# Patient Record
Sex: Male | Born: 1940 | Race: White | Hispanic: No | Marital: Married | Smoking: Former smoker
Health system: Southern US, Community
[De-identification: ages and names within clinical notes are randomized; demographics above are authoritative.]

## PROBLEM LIST (undated history)

## (undated) DIAGNOSIS — F419 Anxiety disorder, unspecified: Secondary | ICD-10-CM

## (undated) DIAGNOSIS — M545 Low back pain, unspecified: Secondary | ICD-10-CM

## (undated) DIAGNOSIS — G834 Cauda equina syndrome: Secondary | ICD-10-CM

## (undated) DIAGNOSIS — M879 Osteonecrosis, unspecified: Secondary | ICD-10-CM

## (undated) DIAGNOSIS — I5032 Chronic diastolic (congestive) heart failure: Secondary | ICD-10-CM

## (undated) DIAGNOSIS — R339 Retention of urine, unspecified: Secondary | ICD-10-CM

## (undated) DIAGNOSIS — D759 Disease of blood and blood-forming organs, unspecified: Secondary | ICD-10-CM

## (undated) DIAGNOSIS — E785 Hyperlipidemia, unspecified: Secondary | ICD-10-CM

## (undated) DIAGNOSIS — K219 Gastro-esophageal reflux disease without esophagitis: Secondary | ICD-10-CM

## (undated) DIAGNOSIS — R197 Diarrhea, unspecified: Secondary | ICD-10-CM

## (undated) DIAGNOSIS — I4891 Unspecified atrial fibrillation: Secondary | ICD-10-CM

## (undated) DIAGNOSIS — M5030 Other cervical disc degeneration, unspecified cervical region: Secondary | ICD-10-CM

## (undated) DIAGNOSIS — Z8614 Personal history of Methicillin resistant Staphylococcus aureus infection: Secondary | ICD-10-CM

## (undated) DIAGNOSIS — I2510 Atherosclerotic heart disease of native coronary artery without angina pectoris: Secondary | ICD-10-CM

## (undated) DIAGNOSIS — J189 Pneumonia, unspecified organism: Secondary | ICD-10-CM

## (undated) DIAGNOSIS — N419 Inflammatory disease of prostate, unspecified: Secondary | ICD-10-CM

## (undated) DIAGNOSIS — M1990 Unspecified osteoarthritis, unspecified site: Secondary | ICD-10-CM

## (undated) DIAGNOSIS — M8700 Idiopathic aseptic necrosis of unspecified bone: Secondary | ICD-10-CM

## (undated) DIAGNOSIS — R600 Localized edema: Secondary | ICD-10-CM

## (undated) DIAGNOSIS — D509 Iron deficiency anemia, unspecified: Secondary | ICD-10-CM

## (undated) DIAGNOSIS — R739 Hyperglycemia, unspecified: Secondary | ICD-10-CM

## (undated) DIAGNOSIS — K439 Ventral hernia without obstruction or gangrene: Secondary | ICD-10-CM

## (undated) DIAGNOSIS — I10 Essential (primary) hypertension: Secondary | ICD-10-CM

## (undated) DIAGNOSIS — K56609 Unspecified intestinal obstruction, unspecified as to partial versus complete obstruction: Secondary | ICD-10-CM

## (undated) DIAGNOSIS — R269 Unspecified abnormalities of gait and mobility: Secondary | ICD-10-CM

## (undated) DIAGNOSIS — N181 Chronic kidney disease, stage 1: Secondary | ICD-10-CM

## (undated) DIAGNOSIS — M1993 Secondary osteoarthritis, unspecified site: Secondary | ICD-10-CM

## (undated) DIAGNOSIS — M109 Gout, unspecified: Secondary | ICD-10-CM

## (undated) DIAGNOSIS — N179 Acute kidney failure, unspecified: Secondary | ICD-10-CM

## (undated) DIAGNOSIS — R040 Epistaxis: Secondary | ICD-10-CM

## (undated) HISTORY — DX: Ventral hernia without obstruction or gangrene: K43.9

## (undated) HISTORY — DX: Diarrhea, unspecified: R19.7

## (undated) HISTORY — DX: Osteonecrosis, unspecified: M87.9

## (undated) HISTORY — DX: Gout, unspecified: M10.9

## (undated) HISTORY — DX: Atherosclerotic heart disease of native coronary artery without angina pectoris: I25.10

## (undated) HISTORY — DX: Hyperlipidemia, unspecified: E78.5

## (undated) HISTORY — DX: Localized edema: R60.0

## (undated) HISTORY — PX: CATARACT EXTRACTION W/ INTRAOCULAR LENS  IMPLANT, BILATERAL: SHX1307

## (undated) HISTORY — DX: Unspecified atrial fibrillation: I48.91

## (undated) HISTORY — PX: BACK SURGERY: SHX140

## (undated) HISTORY — DX: Anxiety disorder, unspecified: F41.9

## (undated) HISTORY — DX: Inflammatory disease of prostate, unspecified: N41.9

## (undated) HISTORY — DX: Unspecified abnormalities of gait and mobility: R26.9

## (undated) HISTORY — DX: Essential (primary) hypertension: I10

## (undated) HISTORY — PX: SHOULDER OPEN ROTATOR CUFF REPAIR: SHX2407

## (undated) HISTORY — DX: Chronic diastolic (congestive) heart failure: I50.32

## (undated) HISTORY — DX: Secondary osteoarthritis, unspecified site: M19.93

## (undated) HISTORY — DX: Hyperglycemia, unspecified: R73.9

## (undated) HISTORY — DX: Retention of urine, unspecified: R33.9

## (undated) HISTORY — DX: Cauda equina syndrome: G83.4

## (undated) HISTORY — PX: INCISION AND DRAINAGE OF WOUND: SHX1803

## (undated) HISTORY — DX: Epistaxis: R04.0

## (undated) HISTORY — DX: Low back pain, unspecified: M54.50

## (undated) HISTORY — DX: Personal history of Methicillin resistant Staphylococcus aureus infection: Z86.14

## (undated) HISTORY — DX: Chronic kidney disease, stage 1: N18.1

## (undated) HISTORY — DX: Acute kidney failure, unspecified: N17.9

## (undated) HISTORY — PX: CHOLECYSTECTOMY: SHX55

## (undated) HISTORY — DX: Gastro-esophageal reflux disease without esophagitis: K21.9

## (undated) HISTORY — DX: Pneumonia, unspecified organism: J18.9

## (undated) HISTORY — PX: EYE SURGERY: SHX253

## (undated) HISTORY — DX: Other cervical disc degeneration, unspecified cervical region: M50.30

## (undated) HISTORY — PX: PERIPHERALLY INSERTED CENTRAL CATHETER INSERTION: SHX2221

## (undated) HISTORY — DX: Iron deficiency anemia, unspecified: D50.9

---
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
## 1998-07-28 HISTORY — PX: CORONARY ARTERY BYPASS GRAFT: SHX141

## 1998-07-28 HISTORY — PX: OTHER SURGICAL HISTORY: SHX169

## 1998-10-02 ENCOUNTER — Inpatient Hospital Stay (HOSPITAL_COMMUNITY): Admission: AD | Admit: 1998-10-02 | Discharge: 1998-10-19 | Payer: Self-pay | Admitting: Cardiology

## 1998-10-07 ENCOUNTER — Encounter: Payer: Self-pay | Admitting: Thoracic Surgery (Cardiothoracic Vascular Surgery)

## 1998-10-08 ENCOUNTER — Encounter: Payer: Self-pay | Admitting: Thoracic Surgery (Cardiothoracic Vascular Surgery)

## 1998-10-09 ENCOUNTER — Encounter: Payer: Self-pay | Admitting: Thoracic Surgery (Cardiothoracic Vascular Surgery)

## 1998-10-10 ENCOUNTER — Encounter: Payer: Self-pay | Admitting: Thoracic Surgery (Cardiothoracic Vascular Surgery)

## 1998-10-11 ENCOUNTER — Encounter: Payer: Self-pay | Admitting: Thoracic Surgery (Cardiothoracic Vascular Surgery)

## 1998-10-12 ENCOUNTER — Encounter: Payer: Self-pay | Admitting: Thoracic Surgery (Cardiothoracic Vascular Surgery)

## 1998-10-13 ENCOUNTER — Encounter: Payer: Self-pay | Admitting: Thoracic Surgery (Cardiothoracic Vascular Surgery)

## 1998-10-14 ENCOUNTER — Encounter: Payer: Self-pay | Admitting: Thoracic Surgery (Cardiothoracic Vascular Surgery)

## 1998-10-16 ENCOUNTER — Encounter: Payer: Self-pay | Admitting: Thoracic Surgery (Cardiothoracic Vascular Surgery)

## 1998-11-03 ENCOUNTER — Encounter: Payer: Self-pay | Admitting: Cardiology

## 1998-11-03 ENCOUNTER — Inpatient Hospital Stay (HOSPITAL_COMMUNITY): Admission: AD | Admit: 1998-11-03 | Discharge: 1998-11-15 | Payer: Self-pay | Admitting: Cardiology

## 1998-11-05 ENCOUNTER — Encounter: Payer: Self-pay | Admitting: Cardiology

## 1998-11-06 ENCOUNTER — Encounter: Payer: Self-pay | Admitting: Thoracic Surgery (Cardiothoracic Vascular Surgery)

## 1998-11-07 ENCOUNTER — Encounter: Payer: Self-pay | Admitting: Thoracic Surgery (Cardiothoracic Vascular Surgery)

## 1998-11-08 ENCOUNTER — Encounter: Payer: Self-pay | Admitting: Cardiology

## 1998-11-09 ENCOUNTER — Encounter: Payer: Self-pay | Admitting: Thoracic Surgery (Cardiothoracic Vascular Surgery)

## 1998-11-11 ENCOUNTER — Encounter: Payer: Self-pay | Admitting: Cardiology

## 1998-11-12 ENCOUNTER — Encounter: Payer: Self-pay | Admitting: Thoracic Surgery (Cardiothoracic Vascular Surgery)

## 1998-11-23 ENCOUNTER — Emergency Department (HOSPITAL_COMMUNITY): Admission: EM | Admit: 1998-11-23 | Discharge: 1998-11-23 | Payer: Self-pay | Admitting: Emergency Medicine

## 2001-02-09 ENCOUNTER — Ambulatory Visit (HOSPITAL_COMMUNITY): Admission: RE | Admit: 2001-02-09 | Discharge: 2001-02-09 | Payer: Self-pay | Admitting: *Deleted

## 2001-08-26 ENCOUNTER — Emergency Department (HOSPITAL_COMMUNITY): Admission: EM | Admit: 2001-08-26 | Discharge: 2001-08-27 | Payer: Self-pay | Admitting: Emergency Medicine

## 2001-08-27 ENCOUNTER — Encounter: Payer: Self-pay | Admitting: Emergency Medicine

## 2002-07-18 ENCOUNTER — Emergency Department (HOSPITAL_COMMUNITY): Admission: EM | Admit: 2002-07-18 | Discharge: 2002-07-18 | Payer: Self-pay | Admitting: *Deleted

## 2002-07-19 ENCOUNTER — Inpatient Hospital Stay (HOSPITAL_COMMUNITY): Admission: EM | Admit: 2002-07-19 | Discharge: 2002-07-25 | Payer: Self-pay | Admitting: Emergency Medicine

## 2002-07-19 ENCOUNTER — Encounter: Payer: Self-pay | Admitting: Emergency Medicine

## 2003-03-30 ENCOUNTER — Encounter: Payer: Self-pay | Admitting: Emergency Medicine

## 2003-03-30 ENCOUNTER — Emergency Department (HOSPITAL_COMMUNITY): Admission: EM | Admit: 2003-03-30 | Discharge: 2003-03-30 | Payer: Self-pay | Admitting: Emergency Medicine

## 2003-07-17 ENCOUNTER — Inpatient Hospital Stay (HOSPITAL_COMMUNITY): Admission: AD | Admit: 2003-07-17 | Discharge: 2003-07-21 | Payer: Self-pay | Admitting: General Surgery

## 2003-07-30 ENCOUNTER — Emergency Department (HOSPITAL_COMMUNITY): Admission: EM | Admit: 2003-07-30 | Discharge: 2003-07-30 | Payer: Self-pay | Admitting: Emergency Medicine

## 2003-08-13 ENCOUNTER — Emergency Department (HOSPITAL_COMMUNITY): Admission: EM | Admit: 2003-08-13 | Discharge: 2003-08-13 | Payer: Self-pay | Admitting: Emergency Medicine

## 2003-09-27 ENCOUNTER — Ambulatory Visit (HOSPITAL_COMMUNITY): Admission: RE | Admit: 2003-09-27 | Discharge: 2003-09-27 | Payer: Self-pay | Admitting: Preventative Medicine

## 2004-08-26 ENCOUNTER — Inpatient Hospital Stay (HOSPITAL_COMMUNITY): Admission: EM | Admit: 2004-08-26 | Discharge: 2004-08-28 | Payer: Self-pay | Admitting: Cardiology

## 2004-08-26 ENCOUNTER — Encounter: Payer: Self-pay | Admitting: Emergency Medicine

## 2004-08-26 ENCOUNTER — Ambulatory Visit: Payer: Self-pay | Admitting: Cardiovascular Disease

## 2004-09-18 ENCOUNTER — Ambulatory Visit: Payer: Self-pay | Admitting: *Deleted

## 2004-10-30 ENCOUNTER — Ambulatory Visit: Payer: Self-pay | Admitting: Infectious Diseases

## 2004-12-11 ENCOUNTER — Ambulatory Visit: Payer: Self-pay | Admitting: Infectious Diseases

## 2005-02-17 ENCOUNTER — Ambulatory Visit: Payer: Self-pay | Admitting: Infectious Diseases

## 2005-07-17 ENCOUNTER — Ambulatory Visit (HOSPITAL_COMMUNITY): Admission: RE | Admit: 2005-07-17 | Discharge: 2005-07-17 | Payer: Self-pay | Admitting: Pulmonary Disease

## 2005-09-30 ENCOUNTER — Inpatient Hospital Stay (HOSPITAL_COMMUNITY): Admission: RE | Admit: 2005-09-30 | Discharge: 2005-10-07 | Payer: Self-pay | Admitting: General Surgery

## 2006-07-03 ENCOUNTER — Emergency Department (HOSPITAL_COMMUNITY): Admission: EM | Admit: 2006-07-03 | Discharge: 2006-07-03 | Payer: Self-pay | Admitting: Emergency Medicine

## 2008-02-11 ENCOUNTER — Inpatient Hospital Stay (HOSPITAL_COMMUNITY): Admission: AD | Admit: 2008-02-11 | Discharge: 2008-02-13 | Payer: Self-pay | Admitting: Cardiology

## 2008-02-11 ENCOUNTER — Ambulatory Visit: Payer: Self-pay | Admitting: *Deleted

## 2008-02-18 ENCOUNTER — Ambulatory Visit: Payer: Self-pay | Admitting: Cardiology

## 2008-02-24 ENCOUNTER — Ambulatory Visit: Payer: Self-pay | Admitting: Cardiovascular Disease

## 2008-02-24 ENCOUNTER — Ambulatory Visit: Payer: Self-pay | Admitting: Cardiology

## 2008-02-24 ENCOUNTER — Encounter (HOSPITAL_COMMUNITY): Admission: RE | Admit: 2008-02-24 | Discharge: 2008-03-25 | Payer: Self-pay | Admitting: Cardiology

## 2008-03-19 ENCOUNTER — Encounter: Payer: Self-pay | Admitting: Emergency Medicine

## 2008-03-19 ENCOUNTER — Inpatient Hospital Stay (HOSPITAL_COMMUNITY): Admission: EM | Admit: 2008-03-19 | Discharge: 2008-03-21 | Payer: Self-pay | Admitting: Emergency Medicine

## 2008-04-20 ENCOUNTER — Inpatient Hospital Stay (HOSPITAL_COMMUNITY): Admission: RE | Admit: 2008-04-20 | Discharge: 2008-04-26 | Payer: Self-pay | Admitting: General Surgery

## 2008-07-09 ENCOUNTER — Emergency Department (HOSPITAL_COMMUNITY): Admission: EM | Admit: 2008-07-09 | Discharge: 2008-07-09 | Payer: Self-pay | Admitting: Emergency Medicine

## 2008-07-11 ENCOUNTER — Ambulatory Visit (HOSPITAL_COMMUNITY): Admission: RE | Admit: 2008-07-11 | Discharge: 2008-07-11 | Payer: Self-pay | Admitting: Pulmonary Disease

## 2008-10-28 ENCOUNTER — Observation Stay (HOSPITAL_COMMUNITY): Admission: EM | Admit: 2008-10-28 | Discharge: 2008-10-29 | Payer: Self-pay | Admitting: Emergency Medicine

## 2009-01-17 ENCOUNTER — Ambulatory Visit: Payer: Self-pay | Admitting: Orthopedic Surgery

## 2009-01-19 ENCOUNTER — Encounter: Payer: Self-pay | Admitting: Orthopedic Surgery

## 2009-02-06 ENCOUNTER — Ambulatory Visit: Payer: Self-pay | Admitting: Orthopedic Surgery

## 2009-02-07 ENCOUNTER — Encounter (INDEPENDENT_AMBULATORY_CARE_PROVIDER_SITE_OTHER): Payer: Self-pay | Admitting: *Deleted

## 2009-02-07 ENCOUNTER — Encounter: Payer: Self-pay | Admitting: Orthopedic Surgery

## 2009-02-12 ENCOUNTER — Ambulatory Visit (HOSPITAL_COMMUNITY): Admission: RE | Admit: 2009-02-12 | Discharge: 2009-02-12 | Payer: Self-pay | Admitting: Orthopedic Surgery

## 2009-02-19 ENCOUNTER — Ambulatory Visit: Payer: Self-pay | Admitting: Orthopedic Surgery

## 2009-02-19 DIAGNOSIS — M75120 Complete rotator cuff tear or rupture of unspecified shoulder, not specified as traumatic: Secondary | ICD-10-CM | POA: Insufficient documentation

## 2009-02-26 ENCOUNTER — Telehealth: Payer: Self-pay | Admitting: Orthopedic Surgery

## 2009-03-02 ENCOUNTER — Telehealth: Payer: Self-pay | Admitting: Orthopedic Surgery

## 2009-03-20 ENCOUNTER — Ambulatory Visit: Payer: Self-pay | Admitting: Orthopedic Surgery

## 2009-03-20 ENCOUNTER — Inpatient Hospital Stay (HOSPITAL_COMMUNITY): Admission: RE | Admit: 2009-03-20 | Discharge: 2009-03-21 | Payer: Self-pay | Admitting: Orthopedic Surgery

## 2009-03-22 ENCOUNTER — Ambulatory Visit: Payer: Self-pay | Admitting: Orthopedic Surgery

## 2009-03-28 ENCOUNTER — Encounter (HOSPITAL_COMMUNITY): Admission: RE | Admit: 2009-03-28 | Discharge: 2009-04-26 | Payer: Self-pay | Admitting: Orthopedic Surgery

## 2009-03-28 ENCOUNTER — Encounter: Payer: Self-pay | Admitting: Orthopedic Surgery

## 2009-03-29 ENCOUNTER — Ambulatory Visit: Payer: Self-pay | Admitting: Orthopedic Surgery

## 2009-04-03 ENCOUNTER — Ambulatory Visit: Payer: Self-pay | Admitting: Orthopedic Surgery

## 2009-04-27 ENCOUNTER — Encounter (HOSPITAL_COMMUNITY): Admission: RE | Admit: 2009-04-27 | Discharge: 2009-05-27 | Payer: Self-pay | Admitting: Orthopedic Surgery

## 2009-05-02 ENCOUNTER — Ambulatory Visit: Payer: Self-pay | Admitting: Orthopedic Surgery

## 2009-05-04 ENCOUNTER — Encounter: Payer: Self-pay | Admitting: Orthopedic Surgery

## 2009-05-04 ENCOUNTER — Inpatient Hospital Stay (HOSPITAL_COMMUNITY): Admission: EM | Admit: 2009-05-04 | Discharge: 2009-05-05 | Payer: Self-pay | Admitting: Emergency Medicine

## 2009-05-07 ENCOUNTER — Ambulatory Visit: Payer: Self-pay | Admitting: Orthopedic Surgery

## 2009-05-11 ENCOUNTER — Ambulatory Visit (HOSPITAL_COMMUNITY): Admission: RE | Admit: 2009-05-11 | Discharge: 2009-05-11 | Payer: Self-pay | Admitting: Orthopedic Surgery

## 2009-05-15 ENCOUNTER — Ambulatory Visit: Payer: Self-pay | Admitting: Orthopedic Surgery

## 2009-05-24 ENCOUNTER — Ambulatory Visit: Payer: Self-pay | Admitting: Orthopedic Surgery

## 2009-06-12 ENCOUNTER — Encounter: Payer: Self-pay | Admitting: Orthopedic Surgery

## 2009-07-18 ENCOUNTER — Telehealth: Payer: Self-pay | Admitting: Orthopedic Surgery

## 2009-07-30 ENCOUNTER — Encounter: Payer: Self-pay | Admitting: Orthopedic Surgery

## 2009-08-02 ENCOUNTER — Ambulatory Visit: Payer: Self-pay | Admitting: Orthopedic Surgery

## 2009-08-22 ENCOUNTER — Ambulatory Visit: Payer: Self-pay | Admitting: Orthopedic Surgery

## 2009-09-12 ENCOUNTER — Ambulatory Visit: Payer: Self-pay | Admitting: Orthopedic Surgery

## 2009-10-01 ENCOUNTER — Ambulatory Visit (HOSPITAL_COMMUNITY): Admission: RE | Admit: 2009-10-01 | Discharge: 2009-10-01 | Payer: Self-pay | Admitting: Pulmonary Disease

## 2009-12-12 ENCOUNTER — Encounter (INDEPENDENT_AMBULATORY_CARE_PROVIDER_SITE_OTHER): Payer: Self-pay | Admitting: *Deleted

## 2010-01-01 ENCOUNTER — Encounter: Payer: Self-pay | Admitting: Adult Health

## 2010-01-01 ENCOUNTER — Ambulatory Visit: Payer: Self-pay | Admitting: Cardiology

## 2010-01-01 DIAGNOSIS — I10 Essential (primary) hypertension: Secondary | ICD-10-CM

## 2010-01-01 DIAGNOSIS — R079 Chest pain, unspecified: Secondary | ICD-10-CM

## 2010-01-03 ENCOUNTER — Encounter: Payer: Self-pay | Admitting: Adult Health

## 2010-01-09 ENCOUNTER — Encounter (INDEPENDENT_AMBULATORY_CARE_PROVIDER_SITE_OTHER): Payer: Self-pay | Admitting: *Deleted

## 2010-01-09 ENCOUNTER — Encounter: Payer: Self-pay | Admitting: Infectious Diseases

## 2010-01-09 LAB — CONVERTED CEMR LAB
ALT: 12 units/L
ALT: 12 units/L (ref 0–53)
AST: 17 units/L
AST: 17 units/L (ref 0–37)
Albumin: 4 g/dL
Albumin: 4 g/dL (ref 3.5–5.2)
Alkaline Phosphatase: 70 units/L
Alkaline Phosphatase: 70 units/L (ref 39–117)
Cholesterol: 131 mg/dL (ref 0–200)
HDL: 42 mg/dL
HDL: 42 mg/dL (ref >39)
Indirect Bilirubin: 0.3 mg/dL (ref 0.0–0.9)
LDL Cholesterol: 73 mg/dL (ref 0–99)
Total Protein: 6.4 g/dL
Total Protein: 6.4 g/dL (ref 6.0–8.3)
Triglycerides: 80 mg/dL (ref <150)

## 2010-01-14 ENCOUNTER — Encounter (INDEPENDENT_AMBULATORY_CARE_PROVIDER_SITE_OTHER): Payer: Self-pay | Admitting: *Deleted

## 2010-01-16 ENCOUNTER — Encounter: Payer: Self-pay | Admitting: Cardiology

## 2010-01-16 ENCOUNTER — Ambulatory Visit: Payer: Self-pay | Admitting: Cardiology

## 2010-01-16 ENCOUNTER — Encounter (HOSPITAL_COMMUNITY): Admission: RE | Admit: 2010-01-16 | Discharge: 2010-01-16 | Payer: Self-pay | Admitting: Cardiology

## 2010-01-21 ENCOUNTER — Ambulatory Visit: Payer: Self-pay | Admitting: Cardiology

## 2010-01-29 ENCOUNTER — Encounter (INDEPENDENT_AMBULATORY_CARE_PROVIDER_SITE_OTHER): Payer: Self-pay | Admitting: *Deleted

## 2010-01-31 ENCOUNTER — Encounter (HOSPITAL_COMMUNITY): Admission: RE | Admit: 2010-01-31 | Discharge: 2010-03-02 | Payer: Self-pay | Admitting: Orthopedic Surgery

## 2010-01-31 ENCOUNTER — Ambulatory Visit (HOSPITAL_COMMUNITY): Payer: Self-pay | Admitting: Orthopedic Surgery

## 2010-01-31 ENCOUNTER — Ambulatory Visit: Payer: Self-pay | Admitting: Orthopedic Surgery

## 2010-01-31 LAB — CONVERTED CEMR LAB
Neutrophil, Synovial: 99 % — ABNORMAL HIGH (ref 0–25)
WBC, Synovial: 52370 — ABNORMAL HIGH (ref 0–200)

## 2010-02-01 ENCOUNTER — Ambulatory Visit (HOSPITAL_COMMUNITY): Payer: Self-pay | Admitting: Orthopedic Surgery

## 2010-02-04 ENCOUNTER — Telehealth: Payer: Self-pay | Admitting: Orthopedic Surgery

## 2010-02-04 ENCOUNTER — Inpatient Hospital Stay (HOSPITAL_COMMUNITY): Admission: AD | Admit: 2010-02-04 | Discharge: 2010-02-11 | Payer: Self-pay | Admitting: Orthopedic Surgery

## 2010-02-04 ENCOUNTER — Ambulatory Visit: Payer: Self-pay | Admitting: Orthopedic Surgery

## 2010-02-04 DIAGNOSIS — M009 Pyogenic arthritis, unspecified: Secondary | ICD-10-CM | POA: Insufficient documentation

## 2010-02-05 ENCOUNTER — Ambulatory Visit: Payer: Self-pay | Admitting: Orthopedic Surgery

## 2010-02-07 ENCOUNTER — Encounter: Payer: Self-pay | Admitting: Orthopedic Surgery

## 2010-02-11 ENCOUNTER — Encounter: Payer: Self-pay | Admitting: Orthopedic Surgery

## 2010-02-12 ENCOUNTER — Ambulatory Visit: Payer: Self-pay | Admitting: Orthopedic Surgery

## 2010-02-14 ENCOUNTER — Ambulatory Visit: Payer: Self-pay | Admitting: Orthopedic Surgery

## 2010-02-19 ENCOUNTER — Encounter: Payer: Self-pay | Admitting: Orthopedic Surgery

## 2010-02-20 ENCOUNTER — Ambulatory Visit: Payer: Self-pay | Admitting: Orthopedic Surgery

## 2010-02-21 ENCOUNTER — Telehealth: Payer: Self-pay | Admitting: Orthopedic Surgery

## 2010-02-25 ENCOUNTER — Encounter (INDEPENDENT_AMBULATORY_CARE_PROVIDER_SITE_OTHER): Payer: Self-pay | Admitting: *Deleted

## 2010-02-25 LAB — CONVERTED CEMR LAB
BUN: 20 mg/dL
Basophils Absolute: 0 10*3/uL
Basophils Relative: 1 %
Eosinophils Relative: 9 %
HCT: 33.5 %
Lymphocytes Relative: 19 %
MCHC: 33.4 g/dL
MCV: 88.3 fL
Monocytes Absolute: 0.8 10*3/uL
Platelets: 262 10*3/uL
WBC: 6.7 10*3/uL

## 2010-02-27 ENCOUNTER — Encounter: Payer: Self-pay | Admitting: Orthopedic Surgery

## 2010-03-05 ENCOUNTER — Encounter: Payer: Self-pay | Admitting: Orthopedic Surgery

## 2010-03-13 ENCOUNTER — Ambulatory Visit: Payer: Self-pay | Admitting: Orthopedic Surgery

## 2010-03-18 ENCOUNTER — Telehealth: Payer: Self-pay | Admitting: Orthopedic Surgery

## 2010-03-19 ENCOUNTER — Telehealth: Payer: Self-pay | Admitting: Orthopedic Surgery

## 2010-04-03 ENCOUNTER — Ambulatory Visit: Payer: Self-pay | Admitting: Orthopedic Surgery

## 2010-04-18 ENCOUNTER — Encounter (INDEPENDENT_AMBULATORY_CARE_PROVIDER_SITE_OTHER): Payer: Self-pay | Admitting: *Deleted

## 2010-04-24 ENCOUNTER — Ambulatory Visit: Payer: Self-pay | Admitting: Cardiology

## 2010-04-24 DIAGNOSIS — K432 Incisional hernia without obstruction or gangrene: Secondary | ICD-10-CM | POA: Insufficient documentation

## 2010-04-24 DIAGNOSIS — E785 Hyperlipidemia, unspecified: Secondary | ICD-10-CM

## 2010-04-24 DIAGNOSIS — K219 Gastro-esophageal reflux disease without esophagitis: Secondary | ICD-10-CM

## 2010-04-24 DIAGNOSIS — M1990 Unspecified osteoarthritis, unspecified site: Secondary | ICD-10-CM | POA: Insufficient documentation

## 2010-05-13 ENCOUNTER — Ambulatory Visit: Payer: Self-pay | Admitting: Infectious Disease

## 2010-05-13 DIAGNOSIS — L299 Pruritus, unspecified: Secondary | ICD-10-CM | POA: Insufficient documentation

## 2010-05-13 DIAGNOSIS — A4902 Methicillin resistant Staphylococcus aureus infection, unspecified site: Secondary | ICD-10-CM | POA: Insufficient documentation

## 2010-05-13 DIAGNOSIS — M48061 Spinal stenosis, lumbar region without neurogenic claudication: Secondary | ICD-10-CM | POA: Insufficient documentation

## 2010-05-14 ENCOUNTER — Encounter: Payer: Self-pay | Admitting: Infectious Diseases

## 2010-06-11 ENCOUNTER — Encounter (INDEPENDENT_AMBULATORY_CARE_PROVIDER_SITE_OTHER): Payer: Self-pay | Admitting: *Deleted

## 2010-06-11 ENCOUNTER — Ambulatory Visit: Payer: Self-pay | Admitting: Orthopedic Surgery

## 2010-06-14 ENCOUNTER — Telehealth: Payer: Self-pay | Admitting: Orthopedic Surgery

## 2010-07-02 ENCOUNTER — Ambulatory Visit: Payer: Self-pay | Admitting: Infectious Disease

## 2010-07-02 ENCOUNTER — Encounter: Payer: Self-pay | Admitting: Infectious Diseases

## 2010-07-02 DIAGNOSIS — L109 Pemphigus, unspecified: Secondary | ICD-10-CM

## 2010-07-02 LAB — CONVERTED CEMR LAB
AST: 16 units/L (ref 0–37)
Alkaline Phosphatase: 84 units/L (ref 39–117)
BUN: 20 mg/dL (ref 6–23)
Basophils Relative: 0 % (ref 0–1)
Eosinophils Absolute: 0.1 10*3/uL (ref 0.0–0.7)
Eosinophils Relative: 1 % (ref 0–5)
Glucose, Bld: 96 mg/dL (ref 70–99)
HCT: 41.4 % (ref 39.0–52.0)
Hemoglobin: 13 g/dL (ref 13.0–17.0)
MCHC: 31.4 g/dL (ref 30.0–36.0)
MCV: 92.8 fL (ref 78.0–100.0)
Monocytes Absolute: 0.6 10*3/uL (ref 0.1–1.0)
Monocytes Relative: 5 % (ref 3–12)
RBC: 4.46 M/uL (ref 4.22–5.81)
RDW: 16.1 % — ABNORMAL HIGH (ref 11.5–15.5)
Total Bilirubin: 0.9 mg/dL (ref 0.3–1.2)

## 2010-07-08 ENCOUNTER — Encounter: Payer: Self-pay | Admitting: Infectious Disease

## 2010-07-28 HISTORY — PX: CORONARY ANGIOPLASTY WITH STENT PLACEMENT: SHX49

## 2010-08-17 ENCOUNTER — Encounter: Payer: Self-pay | Admitting: *Deleted

## 2010-08-29 NOTE — Letter (Signed)
Summary: Office note from Dr. Dion Saucier  Office note from Dr. Dion Saucier   Imported By: Jacklynn Ganong 07/31/2009 11:16:03  _____________________________________________________________________  External Attachment:    Type:   Image     Comment:   External Document

## 2010-08-29 NOTE — Assessment & Plan Note (Signed)
Summary: RE-CK/XRAY SHOULDER,POST OP 05/11/09/[facility]   Visit Type:  Follow-up Referring Provider:  self  CC:  right and left shoulder pain .  History of Present Illness: I saw [name] in the office today for a followup visit.  He is a 70 years old man with the complaint of:  RIGHT and LEFT shoulder pain now, LEFT greater than RIGHT.  Rotator cuff repair on 03-20-2009. He was doing well until he went to therapy and he started having increased pain, swelling, and eventually had to have an irrigation, debridement of the shoulder on May 11, 2009. Cultures were negative, but he had a large amount of fluid in the shoulder. His repair had completely come apart.  He is currently on Lorcet 10, half tablet q.4 p.r.n. for pain, and prednisone 5 mg every other day for other reasons.  The patient had a 2nd opinion regarding his RIGHT shoulder with the  [facility] in [place]. I reviewed his notes. In his notes. He notes that the patient had a LEFT rotator cuff tear diagnosed with an MRI and there was extensive tear as well.  He also brought up the possibility of an occult infection with proprionobacter acne. In addition, be explored if any other surgeries done on the RIGHT side tear. He did think that a reverse procedure might be the best option. There is concern for MRSA as well.  He is scheduled for x-rays today in the office on the RIGHT shoulder. We've also discussed treatment for the LEFT shoulder      Allergies: 1)  ! Morphine  Physical Exam  Msk:  [name] comes in todayfor reevaluation of her RIGHT shoulder and evaluation of a new LEFT shoulder rotator cuff tear diagnosed ingrained per Dr. Dion Saucier.  He has good passive range of motion of his RIGHT shoulder with no pain. There is a slight joint effusion.  The LEFT shoulder is weak. His pain forward elevation. The shoulder remained stable. Neurovascular exam is normal. He is awake and alert. He is oriented x3.  His mood and affect is normal.     Impression & Recommendations:  Problem # 1:  OTHER POSTOPERATIVE INFECTION NEC (ICD-998.59)  Orders: Est. Patient Level III (29528)  Problem # 2:  AFTERCARE FOLLOW SURGERY MUSCULOSKEL SYSTEM NEC (ICD-V58.78)  Orders: Est. Patient Level III (41324)  Problem # 3:  RUPTURE ROTATOR CUFF (ICD-727.61)  AP and lateral of the RIGHT shoulder.  There are 3 suture anchors in the humeral head. There is proximal migration of the humerus were about 1 cm evidence of IV break in the Roman arch.  Impression chronic rotator cuff tear.  Patient is rotator cuff tears, one on the LEFT, and a failed rotator cuff repair on the RIGHT.  He will continue to take his pain medication at this time until reevaluation in March at that time. We will rediscuss both shoulders, and possible treatment options. An MRI was obtained in [place] at the office of Dr. Dion Saucier on July 30, 2009. There was a large rotator cuff tear. There as well.  Orders: Est. Patient Level III (40102) Shoulder x-ray,  minimum 2 views (72536)  Patient Instructions: 1)  Come back in March 2011 for recheck on the shoulders

## 2010-08-29 NOTE — Assessment & Plan Note (Signed)
Summary: [facility]   Visit Type:  Follow-up Referring Provider:  Dr. Jeral Fruit Primary Provider:  Dr.Edward Juanetta Gosling  CC:  f/u itching and open sores.  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 No organsims were isolated and he was given a course of parenteral therapy. 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 He had been referred to [place] from Dr. 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  Per Dr. Cassandria Santee notes consideration is being given to posterolateral arthrodesis with autograft. The patient had developed severe pruritis since coming off of the steroids. Dr. Romeo Apple put pt back on prednisone and pruritis has resolved. He seems to have considerable confusion about the difference between his pemphigus and his pyogenic infections. He is without fevers, chills or increase in shoulder pain.    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 Take 1 Tablet By Mouth Once Daily 2)  Celexa 20 Mg Tabs  (Citalopram Hydrobromide) .... Take 1 Tab Daily 3)  Prednisone 20 Mg Tabs (Prednisone) .Marland Kitchen.. 1 By Mouth Qd  Current Medications (verified): 1)  Lisinopril 2.5 Mg Tabs (Lisinopril) .... Take 1 Tablet By Mouth Once Daily 2)  Celexa 20 Mg Tabs (Citalopram Hydrobromide) .... Take 1 Tab Daily 3)  Prednisone 20 Mg Tabs (Prednisone) .Marland Kitchen.. 1 By Mouth Qd  Allergies: 1)  ! Morphine 2)  ! * Metoprolol    Current Allergies (reviewed today): ! MORPHINE ! * METOPROLOL Past History:  Past Medical History: Last updated: 05/13/2010 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; complicated by sternal infection with MRSA withremoval of sternum, part of rib cage surgery by CT and plastic surgery in 2000 ; 2006-total occlusion of native vessels with patent      grafts; normal EF; stress nuclear in 2009 and 2011-chronotropic incompetence; inferior scar; low normal EF  Right shoulder cuff tear and repair, developed infection sp surgery x 2 2 yrs ago repair of ventral hernia Pemphigoid Hypertension Hyperlipidemia Tobacco abuse-20 pack years; discontinued in 2000 Ventral hernia-multiple surgical procedures Gastroesophageal reflux disease Degenerative joint disease Benign prostatic hypertrophy Gout Anxiety  Past Surgical History: Last updated: 04/24/2010 CABG-2000; sternal infection requiring debridement and surgical closure Ventral hernia repair x 5, most recently in 2009 Cholecystectomy-2006 Colonoscopy planned for 2011  Family History: Last updated: 04/24/2010 Mother-history of coronary artery disease; deceased due to neoplastic disease Father-cause of death unknown Siblings: One sister alive and well  Social History: Last updated: 04/24/2010 Married; resides in [place] Unemployed Alcohol-denies Tobacco-20 pack years; discontinued in 2000  Risk Factors: Alcohol Use: 0 (01/17/2009) Caffeine Use: 6 (01/17/2009)  Risk Factors:  Smoking Status: never (01/17/2009)  Review of Systems       The patient  complains of suspicious skin lesions.  The patient denies anorexia, fever, weight loss, weight gain, vision loss, decreased hearing, hoarseness, chest pain, syncope, dyspnea on exertion, peripheral edema, prolonged cough, headaches, hemoptysis, abdominal pain, melena, hematochezia, severe indigestion/heartburn, hematuria, incontinence, genital sores, muscle weakness, transient blindness, difficulty walking, depression, unusual weight change, abnormal bleeding, and enlarged lymph nodes.    Vital Signs:  Patient profile:   70 year old male Height:      72 inches (182.88 cm) Weight:      208.25 pounds (94.66 kg) BMI:     28.35 Temp:     98.5 degrees F (36.94 degrees C) oral Pulse rate:   58 / minute BP sitting:   188 / 82  (left arm)  Vitals Entered By: Starleen Arms CMA (July 02, 2010 10:48 AM) CC: f/u itching, open sores Is Patient Diabetic? No Pain Assessment Patient in pain? no      Nutritional Status BMI of 25 - 29 = overweight  Does patient need assistance? Functional Status Self care Ambulation Normal   Physical Exam  General:  alert, well-developed, well-nourished, and well-hydrated.   Head:  normocephalic and atraumatic.   Eyes:  vision grossly intact, pupils equal, and pupils round.   Ears:  no external deformities.   Nose:  no external deformity.   Mouth:  pharynx pink and moist, no erythema, and no exudates.   Neck:  supple and full ROM.   Chest Wall:  he has sternal defect Lungs:  normal respiratory effort, no crackles, and no wheezes.   Heart:  normal rate, regular rhythm, no murmur, and no gallop.   Abdomen:  soft, non-tender, normal bowel sounds, no distention, and no masses.   Msk:  pt with absence of sternum, he has limited abduction of the right greater than left shoulder Extremities:  1+ left pedal edema and 1+ right pedal edema.   Neurologic:  alert & oriented X3 and gait normal.   Skin:  multiple areas of ecchymosis few hyperemic areas, no  discrete rash Psych:  Oriented X3 and normally interactive.     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 I have encouraged him to fu with his Dermatologist and will also refer thim to Rheum in case there is a steroid sparing regimen that might help him--he has already suffered cataracts too. Orders: Rheumatology Referral (Rheumatology) Est. Patient Level IV (36644)  Problem # 2:  MRSA (ICD-041.12)  no evidence of active infeciton though high liklihood of colonization  Orders: Est. Patient Level IV (03474)  Problem # 3:  SPINAL STENOSIS, LUMBAR (ICD-724.02)  this is risk benefit calculation. I would consider him high risk for acquiring an infection given his hx, his ongoing immunosuppression,  though certainly if surgey becomes urgent need I would not object tot it. If he ends up having surgery down th e road I would give him mupirocin two times a day IN and hibiclenz baths for 5-7 days before surgery and give him vancomycin and ancef prior to surgery.  Orders: Est. Patient Level IV (25956)  Problem # 4:  PYOGENIC ARTHRITIS, SHOULDER REGION (  ICD-711.01) no obvious infection at present. will check esr, crp Orders: T-Comprehensive Metabolic Panel [id number]) T-CBC w/Diff ([id number]) T-C-Reactive Protein [id number]) T-Sed Rate (Automated) [phone]) Est. Patient Level IV (53664)  Patient Instructions: 1)  I do not see evidence on exam for an active infection 2)  I will check blood work to see if there is evidence of inflammation due to infection (or other cause) 3)  If you experience fevers, chills or more subtle symptoms such as increase in shoulder pain or back pain please let us know  and we will reimage your shoulder 4)  the pemphigus itself is not due to an infectoius process but when untreated certainly puts you  at risk for infection (as does the prednisone that is being given to treat it) 5)  I would recommend return to see your Dermatologist and consideratio of RHeumatology consult 6)  rtc to see Dr Daiva Eves in 3 months

## 2010-08-29 NOTE — Miscellaneous (Signed)
Visit Type:  Follow-up Referring Provider:  self Primary Provider:  Nehemiah [name]  CC:  check rt shoulder.  History of Present Illness: I saw [name] in the office today for a 3 week followup visit.  He is a 70 years old man with the complaint of:  right shoulder.pain swelling with possible fever  Previous history:[name] had a failed rotator cuff repair in March 20, 2009 complicated by a large joint effusion which produced negative culture.  He sagittally had irrigation debridement of the shoulder on May 11, 2009 again cultures were negative.  We noted at that time that the repair had completely come apart.  This was thought to be due to his rehabilitation with excessive forward elevation and a snapping popping sound which he felt after a period of being pain-free.  Medications: Percocet 5/325 for pain.  Today is recheck shoulder after aspiration 01/31/10.  Go over lab results and fluid culture.  Has been going for IV ATBS since 01/31/10 everyday.  Temp was 99.8 this am.  Feels nauseated, no nausea med.  Tests: (1) Culture, Body Fluid (16109) ! GRAM STAIN:               No WBC Seen (P) ! GRAM STAIN:               No Organisms Seen (P) ! PRELIM REPORT             NO GROWTH 2 DAYS (P)  ests: (1) Cell Count Synovial Fluid (60454)   Color                     YELLOW                      YELLOW ! Clarity              [A]  CLOUDY                      CLEAR   WBC                  [H]  52370 cu mm                 0-200   Neutrophil           [H]  99 %                        0-25 ! Lymphocyte                1 %                         0-20   Monocyte/Macrophage  [L]  0 %                         50-90 ! Eosinophil                0 %                         0-1  Crystals, Synovial Fluid                             No crystals seen  Current Medications (verified): 1)  Percocet 5-325 Mg Tabs (Oxycodone-Acetaminophen) .... One By Mouth  Q 4 Hrs As Needed 2)  Prednisone 5  Mg Tabs (Prednisone) .... One By Mouth Daily 3)  Doxazosin Mesylate 4 Mg Tabs (Doxazosin Mesylate) .... Take 2 Tabs Nightly 4)  Lisinopril 2.5 Mg Tabs (Lisinopril) .... Take 1 Tablet By Mouth Once Daily  Allergies (verified): 1)  ! Morphine  Past History:  Past Medical History: Last updated: 01/17/2009 htn cholesterol Immune systom suppressed blood blisters, treated with prednisone Has had bad staph infection in chest.  Past Surgical History: Last updated: 01/17/2009 open heart hernia x 5, last hernia 05/2008. staph in chest, no sternum, from after open heart. gallbladder  Family History: Last updated: 01/17/2009 Family History Coronary Heart Disease male < 69  Social History: Last updated: 01/17/2009 Patient is married.   Risk Factors: Alcohol Use: 0 (01/17/2009) Caffeine Use: 6 (01/17/2009)  Risk Factors: Smoking Status: never (01/17/2009)  Family History: Reviewed history from 01/17/2009 and no changes required. Family History Coronary Heart Disease male < 65  Social History: Reviewed history from 01/17/2009 and no changes required. Patient is married.   Review of Systems Constitutional:  Complains of fever, chills, and fatigue; denies weight loss and weight gain. Cardiovascular:  Denies chest pain, palpitations, and fainting. Respiratory:  Complains of short of breath. Gastrointestinal:  Complains of nausea; denies heartburn, vomiting, diarrhea, constipation, and blood in your stools. Genitourinary:  Denies frequency and urgency. Neurologic:  Denies numbness and tingling. Musculoskeletal:  Complains of joint pain, swelling, stiffness, redness, heat, and muscle pain; denies instability. Endocrine:  Denies excessive thirst, exessive urination, and heat or cold intolerance. Psychiatric:  Denies nervousness, depression, anxiety, and hallucinations. Skin:  Complains of changes in the skin and redness; denies poor healing, rash, and itching. HEENT:  Denies  blurred or double vision, eye pain, redness, and watering. Immunology:  Denies seasonal allergies, sinus problems, and allergic to bee stings. Hemoatologic:  Denies easy bleeding and brusing.  Physical Exam  Additional Exam:   *GEN: appearance was normal   ** CDV: normal pulses temperature and no edema  * LYMPH nodes were normal   * SKIN was normal   * Neuro: normal sensation ** Psyche: AAO x 3 and mood was normal   MSK *Gait was normal  *Inspection tenderness and swelling of the RIGHT shoulder over the previously noted cuff incision there is no redness but it is definitely warm there is a definite large joint effusion  He has limited range of motion passive and active including internal and external rotation  He has a chronically weak rotator cuff.  No instability in the shoulder.     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  Informed consent patient notes infection may come back, may lead to further surgeries.  Recommend irrigation debridement, RIGHT shoulder in the OR and take cultures and also look for Propionobacter acne  Orders: Est. Patient Level III (98119)  Patient Instructions: 1)  DIRECT ADMIT

## 2010-08-29 NOTE — Letter (Signed)
Summary: Surgery order  I&D RT shoulder  Surgery order  I&D RT shoulder   Imported By: Cammie Sickle 11/17/2009 11:16:57  _____________________________________________________________________  External Attachment:    Type:   Image     Comment:   External Document

## 2010-08-29 NOTE — Letter (Signed)
Summary: Appointment - Missed  [facility] [place]  [address] [place] [address]   Phone: [phone]  Fax: [phone]     January 29, 2010 MRN: [id number]   KAIYU MIRABAL [address] [place], [place]  [address]   Dear Mr. Guerrera,  Our records indicate you missed your appointment on    01/29/10        NURSE VISIT                                   It is very important that we reach you to reschedule this appointment. We look forward to participating in your health care needs. Please contact us at the number listed above at your earliest convenience to reschedule this appointment.     Sincerely,    Glass blower/designer

## 2010-08-29 NOTE — Assessment & Plan Note (Signed)
Summary: F/U STRESS TEST AND ECHO TO BE DONE 01/11/10/[facility]   Referring Provider:  self Primary Provider:  Dr.Edward Juanetta Gosling   History of Present Illness: Mr/. Poss is a pleasant 70 y/o CM who is here on follow-up. 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  He is followed by [facility] hospital and has had complaints of left shoulder burning and DOE similar to the symptoms he had prior to CABG.  He has noticed the DOE more so over the last few months but is now experiencing L shoulder burning across up to his neck He is not on a BB as this was discontinued by [place] secondary to bradycardia. He was placed on lisinopril 5 mg daily on last visit and a stress test was ordered.  He is here for discussion of results and BP evaluation on new medication.  He denies complaints.     Current Medications (verified): 1)  Percocet 5-325 Mg Tabs (Oxycodone-Acetaminophen) .... One By Mouth Q 4 Hrs As Needed 2)  Prednisone 5 Mg Tabs (Prednisone) .... One By Mouth Daily 3)  Doxazosin Mesylate 4 Mg Tabs (Doxazosin Mesylate) .... Take 2 Tabs Nightly 4)  Lisinopril 2.5 Mg Tabs (Lisinopril) .... Take 1 Tablet By Mouth Once Daily  Allergies (verified): 1)  ! Morphine  Review of Systems       Continues mild SOB at times.  Vital Signs:  Patient profile:   71 year old male Weight:      219 pounds O2 Sat:      93 % Pulse rate:   76 / minute BP sitting:   100 / 64  (left arm)  Vitals Entered ByLarita Fife Via LPN (January 21, 2010 1:01 PM)  Physical Exam  General:  Well developed, well nourished, in no acute distress. Lungs:  Clear bilaterally to auscultation and percussion. Heart:  Non-displaced PMI, chest non-tender; regular rate and rhythm, S1, S2 without murmurs, rubs or gallops. Carotid upstroke normal, no bruit. Normal abdominal aortic size, no bruits. Femorals normal pulses, no bruits. Pedals normal pulses. No edema,  no varicosities. Abdomen:  Bowel sounds positive; abdomen soft and non-tender without masses, organomegaly, or hernias noted. No hepatosplenomegaly. Extremities:  No clubbing or cyanosis. Skin:  Easily friable with multiple areas of ecchymosis. Psych:  anxious.  Figity   Impression & Recommendations:  Problem # 1:  CORONARY ATHEROSCLEROSIS OF ARTERY BYPASS GRAFT (ICD-414.04) Stress myoview was demonstrated impaired exercise capasity, significant ST-segment depression during exercise without angina representing false positve EKG response.  Small degree of scarring of the basilar inferior wall could not be unequivically excluded.  He is without recurrent complaint of L shoulder pain or neck pain.  No further testing is warrented at this time unless symptoms return or become more prominent.  Then would proceed with cardiac catherization. His updated medication list for this problem includes:    Lisinopril 2.5 Mg Tabs (Lisinopril) .Marland Kitchen... Take 1 tablet by mouth once daily  Problem # 2:  HYPERTENSION (ICD-401.9) He was started on lisinopril 5 mg daily on last visit.  His BP is low with positive orthostatics dropping from 105/65-88/56 today.  Will decrease his lisinopril to 2.5mg  daily and he will come back in one week for BP check.   His updated medication list for this problem includes:    Doxazosin Mesylate 4 Mg Tabs (Doxazosin mesylate) .Marland Kitchen... Take 2 tabs nightly    Lisinopril 2.5 Mg Tabs (  Lisinopril) .Marland Kitchen... Take 1 tablet by mouth once daily  Patient Instructions: 1)  Your physician recommends that you schedule a follow-up appointment in: 1 week for blood pressure check with nurse and in 3 months with cardiologist 2)  Your physician has recommended you make the following change in your medication: decrease Lisinopril to 2.5mg  by mouth once daily

## 2010-08-29 NOTE — Progress Notes (Signed)
Summary: Pre-Auth/Notification surgery 02/05/10  Pre-Auth/Notification surgery 02/05/10   Imported By: Cammie Sickle 02/05/2010 10:02:04  _____________________________________________________________________  External Attachment:    Type:   Image     Comment:   External Document

## 2010-08-29 NOTE — Miscellaneous (Signed)
Summary: [facility] plan of care  [facility] plan of care   Imported By: Jacklynn Ganong 02/27/2010 13:26:11  _____________________________________________________________________  External Attachment:    Type:   Image     Comment:   External Document

## 2010-08-29 NOTE — Assessment & Plan Note (Signed)
Summary: POST OP 1/SURG 02/05/10/[facility]   Visit Type:  Follow-up Referring Provider:  self Primary Provider:  [name]  CC:  post op 1 right shoulder.  History of Present Illness: I saw [name] in the office today for a followup visit.  He is a 70 years old man with the complaint of:  post op 1 I and Debridement, drainage, culture right shoulder.  DOS 02/05/10.  POD 7.  Today is dressing change.  Discharged from hospital 02/11/10, yesterday.  IV ATBS daily for 35 days.  Percocet 5 for pain.  MRI for review of the L spine.  SPINAL STENOSIS BUT NOT BOTHERING HIM RIGHT NOW   HE'S FEELING GOOD TODAY     Allergies: 1)  ! Morphine  Physical Exam  Extremities:  THE SHOULDER HAS MINIMAL DRAINAGE   NO REDNESS NO TENDERNESS    Impression & Recommendations:  Problem # 1:  PYOGENIC ARTHRITIS, SHOULDER REGION (ICD-711.01)  I THINK HE HAS A STERILE EFFUSION (GEYERS SIGN)   Orders: Post-Op Check (04540)  Problem # 2:  AFTERCARE FOLLOW SURGERY MUSCULOSKEL SYSTEM NEC (ICD-V58.78) Assessment: Comment Only  IMPROVED   Orders: Post-Op Check (98119)  Patient Instructions: 1)  Thursday staples out

## 2010-08-29 NOTE — Assessment & Plan Note (Signed)
Summary: 3 WK RE-CK SHOULDER/POST OP 02/05/10/SEC HORIZ/CAF   Visit Type:  Follow-up Referring [name]:  self Primary [name]:  [name]  CC:  right shoulder.  History of Present Illness: I saw [name] in the office today for a 3 week  followup visit.  He is a 70 years old man with the complaint of:  right shoulder  Incision and Debridement, drainage, culture right shoulder.  DOS 02/05/10.  IV vancomycin / He is in the midst of a 42 day course 750 mg daily.   he complains of upper extremity shoulder pain. He does have a LEFT rotator cuff tear. His RIGHT shoulder feels good. He feels a bit tired.  He has painful forward elevation of the LEFT shoulder passively with a positive impingement sign. Crepitation.  His RIGHT shoulder. Incision looks good. There is no swelling has no active forward elevation abduction.  He got an injection in his LEFT shoulder subacromial space or come back for followup after the PICC line has been removed.  Allergies: 1)  ! Morphine   Impression & Recommendations:  Problem # 1:  AFTERCARE FOLLOW SURGERY MUSCULOSKEL SYSTEM NEC (ICD-V58.78) Assessment Improved  Orders: Post-Op Check (04540)  Problem # 2:  ROTATOR CUFF INJURY, LEFT SHOULDER (ICD-726.10) Assessment: Deteriorated  Verbal consent obtained/The shoulder was injected with depomedrol 40mg /cc and sensorcaine .25% . There were no complications [left shoulder]  Orders: Est. Patient Level III (98119) Depo- Medrol 40mg  (J1030) Joint Aspirate / Injection, Large (20610)  Patient Instructions: 1)  You have received an injection of cortisone today. You may experience increased pain at the injection site. Apply ice pack to the area for 20 minutes every 2 hours and take 2 xtra strength tylenol every 8 hours. This increased pain will usually resolve in 24 hours. The injection will take effect in 3-10 days.  2)  The [name] Line will be removed on August 26th, I ll call you to set it  up  3)  Sept 7th follow up

## 2010-08-29 NOTE — Consult Note (Signed)
Summary: New Pt. Referral: [facility]  New Pt. Referral: [facility]   Imported By: Florinda Marker 05/14/2010 15:09:33  _____________________________________________________________________  External Attachment:    Type:   Image     Comment:   External Document

## 2010-08-29 NOTE — Progress Notes (Signed)
Summary: patient called, c/o arm hurting more  Phone Note Call from Patient   Summary of Call: Patient called this morning, states he is on way to [facility] for anti-biotic treatment.  Said his arm is really hurting and that he feels sick. Cell # [phone].  Please advise. Initial call taken by: Cammie Sickle,  February 04, 2010 10:15 AM  Follow-up for Phone Call        have him come in at 130 be prepared for admission  Follow-up by: Fuller Canada MD,  February 04, 2010 10:33 AM  Additional Follow-up for Phone Call Additional follow up Details #1::        Patient advised; appointment scheduled. Additional Follow-up by: Cammie Sickle,  February 04, 2010 12:06 PM

## 2010-08-29 NOTE — Assessment & Plan Note (Signed)
Summary: SHOULDER PAIN NEEDS XR/SURG 03/20/09/SEC HOR/BSF   Visit Type:  Follow-up Referring Provider:  self  CC:  right shoulder pian .  History of Present Illness: I saw [name] in the office today for a followup visit.  He is a 70 years old man with the complaint of:  right shoulder pain .  [name] had a failed rotator cuff repair in March 20, 2009 complicated by a large joint effusion which produced negative culture.  He sagittally had irrigation debridement of the shoulder on May 11, 2009 again cultures were negative.  We noted at that time that the repair had completely come apart.  This was thought to be due to his rehabilitation with excessive forward elevation and a snapping popping sound which he felt after a period of being pain-free.  He did have a second opinion at the [facility] and they recommended possible shoulder replacement if cultures for Proprionobacter acne  came back negative  On January 20 30th a sharp severe pain in his RIGHT shoulder and he wants to have that evaluated.  The pain ihas lessened in the last day or 2 he is currently taking a half a Percocet for painprior to this he was on Lorcet 10 mg half a tablet q.4 p.r.n. pain he also takes prednisone 5 mg every other day for various inflammatory conditions  Note: this may also have been a contribution to his failed repair  Review of systems despite his multiple medical problems he is feeling pretty good right now he has no chest pain no shortness of breath.  Allergies: 1)  ! Morphine  Past History:  Past Medical History: Last updated: 01/17/2009 htn cholesterol Immune systom suppressed blood blisters, treated with prednisone Has had bad staph infection in chest.  Physical Exam  Additional Exam:  examination reveals a well-developed well-nourished male grooming and hygiene are intact there is no fever.  His wound looks fine his shoulder looks good there is no swelling or  effusion.  He has no lymphadenopathy in his axilla.  A skin incision looks good.  There is no neurologic changes in his RIGHT hand sensation remains normal.  His range of motion of course is limited to 45 of abduction and 45 of forward for elevation.  He has weakness.  He has tenderness over the pec muscle as it inserts into the arm and has some weakness in his adduction with the arm at 45 of forward elevation.  There is no instability.     Impression & Recommendations:  Problem # 1:  MUSCLE STRAIN, RIGHT PECTORALIS MUSCLE (ICD-848.8) Assessment New  I do not think he has torn the muscle just strained it and is probably related to the increased work of the latissimus and the pectoralis related to his complete cuff tear.  Recommend continue with Percocet for now we will see him in a fewweeks.  Orders: Est. Patient Level IV (78469)  Medications Added to Medication List This Visit: 1)  Percocet 5-325 Mg Tabs (Oxycodone-acetaminophen) .... One by mouth q 4 hrs as needed 2)  Prednisone 5 Mg Tabs (Prednisone) .... One by mouth qod 3)  Percocet 5-325 Mg Tabs (Oxycodone-acetaminophen) .Marland Kitchen.. 1 q 4 as needed pain  Patient Instructions: 1)  Please schedule a follow-up appointment in 3 weeks. 2)  check right shoulder  Prescriptions: PERCOCET 5-325 MG TABS (OXYCODONE-ACETAMINOPHEN) 1 q 4 as needed pain  #90 x 0   Entered and Authorized by:   Fuller Canada MD   Signed  by:   Fuller Canada MD on 08/22/2009   Method used:   Print then Give to Patient   RxID:   [id number]

## 2010-08-29 NOTE — Assessment & Plan Note (Signed)
Summary: ROV CHEST DISCOMFORT   Visit Type:  Follow-up Referring Provider:  self Primary Provider:  [name]  CC:  some sob and chest discomfort.  History of Present Illness: Mr/. Moen is a pleasant 70 y/o CM who is here on follow-up after not being seen for over 2 years.  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  He is followed by [facility] and has had complaints of left shoulder burning and DOE similar to the symptoms he had prior to CABG.  He has noticed the DOE more so over the last few months but is now experiencing L shoulder burning across up to his neck.   He was last seen by Dr. Dietrich Pates in July of 2009.  At that time he had a stress test whcih demonstrated somewhat impaired exercise capacity, minimal increase in HR, with scintigraphic evidence of inferior myocardial scarring.  There was a possibility at that time that the observed abnormalities reflect only diaphramatic attenation.  He is not now on his metoprolol or lisinopril as this was stopped by [place].  He was started on doxazosin 4 mg instead of these.  He has not taken ASA either. 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  Current Medications (verified): 1)  Percocet 5-325 Mg Tabs (Oxycodone-Acetaminophen) .... One By Mouth Q 4 Hrs As Needed 2)  Prednisone 5 Mg Tabs (Prednisone) .... One By Mouth Daily 3)  Doxazosin Mesylate 4 Mg Tabs (Doxazosin Mesylate) .... Take 2 Tabs Nightly 4)  Lisinopril 5 Mg Tabs (Lisinopril) .... Take 1 Tablet By Mouth Once Daily  Allergies (verified): 1)  ! Morphine  Past History:  Past medical, surgical, family and social histories (including risk factors) reviewed, and no changes noted (except as noted below).  Past Medical History: Reviewed history from 01/17/2009 and no changes  required. htn cholesterol Immune systom suppressed blood blisters, treated with prednisone Has had bad staph infection in chest.  Past Surgical History: Reviewed history from 01/17/2009 and no changes required. open heart hernia x 5, last hernia 05/2008. staph in chest, no sternum, from after open heart. gallbladder  Family History: Reviewed history from 01/17/2009 and no changes required. Family History Coronary Heart Disease male < 65  Social History: Reviewed history from 01/17/2009 and no changes required. Patient is married.   Review of Systems       All other systems have been reviewed and are negative unless stated above.   Vital Signs:  Patient profile:   70 year old male Weight:      222 pounds Pulse rate:   68 / minute BP sitting:   152 / 70  (right arm)  Vitals Entered By: Dreama Saa, CNA (January 01, 2010 1:46 PM)  Physical Exam  General:  Well developed, well nourished, in no acute distress. Head:  normocephalic and atraumatic Eyes:  PERRLA/EOM intact; conjunctiva and lids normal. Ears:  TM's intact and clear with normal canals and hearing Nose:  no deformity, discharge, inflammation, or lesions Mouth:  Teeth, gums and palate normal. Oral mucosa normal. Chest Wall:  pectus carinatum.   Lungs:  Clear bilaterally to auscultation and percussion. Heart:  Distant with RRR. Soft systolic murmur. Abdomen:  Bowel sounds positive; abdomen soft and non-tender without masses, organomegaly, or hernias noted. No hepatosplenomegaly. Msk:  Back normal, normal gait. Muscle strength and tone normal. Extremities:  Bruises on extremities. Neurologic:  Alert and oriented x 3. Psych:  Normal affect.   Impression & Recommendations:  Problem # 1:  CORONARY ATHEROSCLEROSIS OF ARTERY BYPASS GRAFT (ICD-414.04)  He is experiencing symptoms similar to those leading to bypass grafts.  There are no acute EKG changes.  His last stress myoview showed scarring inferiorly.  Plan  to reassess with stress myoview and echo to evaluate cardiac fx.  If negative will proceed to cath. His  BP is not optimal.  I will add back the lisinopril for better control.    Lisinopril 5 Mg Tabs (Lisinopril) .Marland Kitchen... Take 1 tablet by mouth once daily  Problem # 2:  HYPERTENSION (ICD-401.9) Assessment: Deteriorated Add back lisinopril. His updated medication list for this problem includes:    Doxazosin Mesylate 4 Mg Tabs (Doxazosin mesylate) .Marland Kitchen... Take 2 tabs nightly    Lisinopril 5 Mg Tabs (Lisinopril) .Marland Kitchen... Take 1 tablet by mouth once daily  Other Orders: Nuclear Stress Test (Nuc Stress Test) 2-D Echocardiogram (2D Echo) T-Lipid Profile (16109-60454) T-Hepatic Function [phone])  Patient Instructions: 1)  Your physician recommends that you schedule a follow-up appointment in: post tests 2)  Your physician recommends that you return for lab work in: this week 3)  Your physician has recommended you make the following change in your medication: Start taking Lisinopril 5mg  by mouth once daily  4)  Your physician has requested that you have an echocardiogram.  Echocardiography is a painless test that uses sound waves to create images of your heart. It provides your doctor with information about the size and shape of your heart and how well your heart's chambers and valves are working.  This procedure takes approximately one hour. There are no restrictions for this procedure. 5)  Your physician has requested that you have an exercise stress myoview.  For further information please visit [email or web address]  Please follow instruction sheet, as given. Prescriptions: LISINOPRIL 5 MG TABS (LISINOPRIL) take 1 tablet by mouth once daily  #30 x 6   Entered by:   Larita Fife Via LPN   Authorized by:   Joni Reining, NP   Signed by:   Larita Fife Via LPN on 29/56/2130   Method used:   Electronically to        [facility] [address] [phone]* (retail)       [address] [place]  [address]        Ph: [phone]       Fax: [phone]   RxID:   [id number]

## 2010-08-29 NOTE — Assessment & Plan Note (Signed)
Summary: POST OP 2/REM STAPLES/SURG 02/05/10/S.HORIZ/CAF   Visit Type:  post op Referring Provider:  self Primary Provider:  Dr.Edward Juanetta Gosling   History of Present Illness: I saw [name] in the office today for a followup visit.  He is a 70 years old man with the complaint of:  post op #2,    Incision and Debridement, drainage, culture right shoulder.  DOS 02/05/10.  IV vancomycin / He is in the midst of a 42 day course  Staples out today.  Percocet 5 for pain.  Culture negative times the second time despite recurrent effusions  His wound looks great I took her staples out at put on some Steri-Strips he has no fluid accumulation but he doesn't feel good his penicillin see [name] I. did notice that he is breathing rather heavily   Allergies: 1)  ! 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

## 2010-08-29 NOTE — Progress Notes (Signed)
Summary: patient's Picc line out  Phone Note From Other Clinic   Caller: physical therapist Summary of Call: Binnie Rail, therapist from [facility], ph [phone], to relay that he was to draw labs today, however, patient reported that his PICC line was already out about 5" and patient removed the line prior to [place] coming to do labs.  Patient ph # [phone]. Please advise. Initial call taken by: Cammie Sickle,  March 18, 2010 9:20 AM

## 2010-08-29 NOTE — Miscellaneous (Signed)
Summary: infectious disease order  Clinical Lists Changes  Orders: Added new Referral order of Infectious Disease Referral (ID) - Signed

## 2010-08-29 NOTE — Letter (Signed)
Summary: surgery order RT Shoulder scheduled 02/05/10  surgery order RT Shoulder scheduled 02/05/10   Imported By: Cammie Sickle 02/11/2010 14:19:43  _____________________________________________________________________  External Attachment:    Type:   Image     Comment:   External Document

## 2010-08-29 NOTE — Assessment & Plan Note (Signed)
Summary: ?INFECTION RT ARM/SHOULDER,SURG 05/11/09/SEC HORIZ/CAF   Visit Type:  Follow-up Referring Provider:  self Primary Provider:  [name]  CC:  right arm redness.  History of Present Illness: I saw [name] in the office today for a 3 week followup visit.  He is a 70 years old man with the complaint of:  right shoulder.pain swelling with possible fever  Previous history:[name] had a failed rotator cuff repair in March 20, 2009 complicated by a large joint effusion which produced negative culture.  He sagittally had irrigation debridement of the shoulder on May 11, 2009 again cultures were negative.  We noted at that time that the repair had completely come apart.  This was thought to be due to his rehabilitation with excessive forward elevation and a snapping popping sound which he felt after a period of being pain-free.  Medications: Percocet 5/325, stopped Prednisone this week.  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  Has chills at night.  Has cough, congestion and some SOB.  Has not called PCP.    Current Medications (verified): 1)  Percocet 5-325 Mg Tabs (Oxycodone-Acetaminophen) .... One By Mouth Q 4 Hrs As Needed 2)  Prednisone 5 Mg Tabs (Prednisone) .... One By Mouth Daily 3)  Doxazosin Mesylate 4 Mg Tabs (Doxazosin Mesylate) .... Take 2 Tabs Nightly 4)  Lisinopril 2.5 Mg Tabs (Lisinopril) .... Take 1 Tablet By Mouth Once Daily  Allergies (verified): 1)  ! Morphine  Past History:  Past Medical History: Last updated: 01/17/2009 htn cholesterol Immune systom suppressed blood blisters, treated with prednisone Has had bad staph infection in chest.  Past Surgical  History: Last updated: 01/17/2009 open heart hernia x 5, last hernia 05/2008. staph in chest, no sternum, from after open heart. gallbladder  Family History: Last updated: 01/17/2009 Family History Coronary Heart Disease male < 104  Social History: Last updated: 01/17/2009 Patient is married.   Risk Factors: Alcohol Use: 0 (01/17/2009) Caffeine Use: 6 (01/17/2009)  Risk Factors: Smoking Status: never (01/17/2009)  Review of Systems Constitutional:  See HPI. Respiratory:  Complains of short of breath and couch. Neurologic:  Denies numbness. Musculoskeletal:  See HPI.  Physical Exam  Additional Exam:   *GEN: appearance was normal   ** CDV: normal pulses temperature and no edema  * LYMPH nodes were normal   * SKIN was normal   * Neuro: normal sensation ** Psyche: AAO x 3 and mood was normal   MSK *Gait was normal  *Inspection tenderness and swelling of the RIGHT shoulder over the previously noted cuff incision there is no redness but it is definitely warm there is a definite large joint effusion  He has limited range of motion passive and active including internal and external rotation  He has a chronically weak rotator cuff.  No instability in the shoulder.     Impression & Recommendations:  Problem # 1:  OTHER POSTOPERATIVE INFECTION NEC (ICD-998.59)  Orders: Est. Patient Level IV (16109) Joint Aspirate / Injection, Large (20610)  Problem # 2:  RUPTURE ROTATOR CUFF (ICD-727.61) probable recurrent infection RIGHT shoulder although cultures never grew out anything  We aspirated about 30 cc of purulent looking material it was  sent for culture and cell count  We also sent him for a CBC with differential, sed rate and C-reactive protein and start him on IV vancomycin and given Percocet for pain and see him in about 3-4 days  Other Orders: T-CBC w/Diff ([id number]) T-Sed Rate (Automated) (57322-02542) T-C-Reactive Protein [id number]) T- * Misc.  Laboratory test [phone])  Patient Instructions: 1)  GO FOR LAB WORK  2)  START IV VANCOMYCIN F/U TUES  Prescriptions: PERCOCET 5-325 MG TABS (OXYCODONE-ACETAMINOPHEN) one by mouth q 4 hrs as needed  #90 x 0   Entered and Authorized by:   Fuller Canada MD   Signed by:   Fuller Canada MD on 01/31/2010   Method used:   Print then Give to Patient   RxID:   [id number]

## 2010-08-29 NOTE — Letter (Signed)
Summary: *Orthopedic No Show Letter  [facility]  [address]  [place], [place] [address]   Phone: [phone]  Fax: [phone]     12/12/2009   Mr. [facility] [address] [place], [place] [address]    Dear Mr. Breese,   Our records indicate that you missed your scheduled appointment with Dr. Beaulah Corin on  Monday, 12/10/09.  Please contact our office to reschedule your appointment as soon as possible.  It is important that you keep your scheduled appointments with your physician, so we can provide you the best care possible.        Sincerely,    Dr. Terrance Mass, MD [facility] Phone [phone]

## 2010-08-29 NOTE — Letter (Signed)
Summary: [place] Treadmill (Nuc Med Stress)  [facility]  [address] [place] [address]   Phone: [phone]  Fax: [phone]    Nuclear Medicine 1-Day Stress Test Information Sheet  Re:     [name]   DOB:     August 01, 1940 MRN:     [id number] Weight:  Appointment Date: Register at: Appointment Time: Referring MD:  _X__Exercise Stress  __Adenosine   __Dobutamine  __Lexiscan  __Persantine   __Thallium  Urgency: ____1 (next day)   ____2 (one week)    ____3 (PRN)  Patient will receive Follow Up call with results: Patient needs follow-up appointment:  Instructions regarding medication:  How to prepare for your stress test: 1. DO NOT eat or dring 6 hours prior to your arrival time. This includes no caffeine (coffee, tea, sodas, chocolate) if you were instructed to take your medications, drink water with it. 2. DO NOT use any tobacco products for at leaset 8 hours prior to arrival. 3. DO NOT wear dresses or any clothing that may have metal clasps or buttons. 4. Wear short sleeve shirts, loose clothing, and comfortalbe walking shoes. 5. DO NOT use lotions, oils or powder on your chest before the test. 6. The test will take approximately 3-4 hours from the time you arrive until completion. 7. To register the day of the test, go to the Short Stay entrance at [facility]. 8. If you must cancel your test, call [phone] as soon as you are aware.  After you arrive for test:   When you arrive at [facility], you will go to Short Stay to be registered. They will then send you to Radiology to check in. The Nuclear Medicine Tech will get you and start an IV in your arm or hand. A small amount of a radioactive tracer will then be injected into your IV. This tracer will then have to circulate for 30-45 minutes. During this time you will wait in the waiting room and you will be able to drink something without caffeine. A series of pictures will be  taken of your heart follwoing this waiting period. After the 1st set of pictures you will go to the stress lab to get ready for your stress test. During the stress test, another small amount of a radioactive tracer will be injected through your IV. When the stress test is complete, there is a short rest period while your heart rate and blood pressure will be monitored. When this monitoring period is complete you will have another set of pictrues taken. (The same as the 1st set of pictures). These pictures are taken between 15 minutes and 1 hour after the stress test. The time depends on the type of stress test you had. Your doctor will inform you of your test results within 7 days after test.    The possibilities of certain changes are possible during the test. They include abnormal blood pressure and disorders of the heart. Side effects of persantine or adenosine can include flushing, chest pain, shortness of breath, stomach tightness, headache and light-headedness. These side effects usually do not last long and are self-resolving. Every effort will be made to keep you comfortable and to minimize complications by obtaining a medical history and by close observation during the test. Emergency equipment, medications, and trained personnel are available to deal with any unusual situation which may arise.  Please notify office at least 48 hours in advance if you are unable to keep  this appt.

## 2010-08-29 NOTE — Miscellaneous (Signed)
Visit Type:  Follow-up Referring Provider:  self Primary Provider:  [name]  CC:  check rt shoulder.  History of Present Illness: I saw [name] in the office today for a 3 week followup visit.  He is a 70 years old man with the complaint of:  right shoulder.pain swelling with possible fever  Previous history:[name] had a failed rotator cuff repair in March 20, 2009 complicated by a large joint effusion which produced negative culture.  He sagittally had irrigation debridement of the shoulder on May 11, 2009 again cultures were negative.  We noted at that time that the repair had completely come apart.  This was thought to be due to his rehabilitation with excessive forward elevation and a snapping popping sound which he felt after a period of being pain-free.  Medications: Percocet 5/325 for pain.  Today is recheck shoulder after aspiration 01/31/10.  Go over lab results and fluid culture.  Has been going for IV ATBS since 01/31/10 everyday.  Temp was 99.8 this am.  Feels nauseated.  Tests: (1) Culture, Body Fluid (11914) ! GRAM STAIN:               No WBC Seen (P) ! GRAM STAIN:               No Organisms Seen (P) ! PRELIM REPORT             NO GROWTH 2 DAYS (P)  ests: (1) Cell Count Synovial Fluid (78295)   Color                     YELLOW                      YELLOW ! Clarity              [A]  CLOUDY                      CLEAR   WBC                  [H]  52370 cu mm                 0-200   Neutrophil           [H]  99 %                        0-25 ! Lymphocyte                1 %                         0-20   Monocyte/Macrophage  [L]  0 %                         50-90 ! Eosinophil                0 %                         0-1  Crystals, Synovial Fluid                             No crystals seen  Current Medications (verified): 1)  Percocet 5-325 Mg Tabs (Oxycodone-Acetaminophen) .... One By Mouth Q 4 Hrs  As Needed 2)  Prednisone 5 Mg Tabs  (Prednisone) .... One By Mouth Daily 3)  Doxazosin Mesylate 4 Mg Tabs (Doxazosin Mesylate) .... Take 2 Tabs Nightly 4)  Lisinopril 2.5 Mg Tabs (Lisinopril) .... Take 1 Tablet By Mouth Once Daily  Allergies (verified): 1)  ! Morphine  Past History:  Past Medical History: Last updated: 01/17/2009 htn cholesterol Immune systom suppressed blood blisters, treated with prednisone Has had bad staph infection in chest.  Past Surgical History: Last updated: 01/17/2009 open heart hernia x 5, last hernia 05/2008. staph in chest, no sternum, from after open heart. gallbladder  Family History: Last updated: 01/17/2009 Family History Coronary Heart Disease male < 10  Social History: Last updated: 01/17/2009 Patient is married.   Risk Factors: Alcohol Use: 0 (01/17/2009) Caffeine Use: 6 (01/17/2009)  Risk Factors: Smoking Status: never (01/17/2009)  Family History: Reviewed history from 01/17/2009 and no changes required. Family History Coronary Heart Disease male < 74  Social History: Reviewed history from 01/17/2009 and no changes required. Patient is married.   Review of Systems Constitutional:  Complains of fever, chills, and fatigue; denies weight loss and weight gain. Cardiovascular:  Denies chest pain, palpitations, and fainting. Respiratory:  Complains of short of breath. Gastrointestinal:  Complains of nausea; denies heartburn, vomiting, diarrhea, constipation, and blood in your stools. Genitourinary:  Denies frequency and urgency. Neurologic:  Denies numbness and tingling. Musculoskeletal:  Complains of joint pain, swelling, stiffness, redness, heat, and muscle pain; denies instability. Endocrine:  Denies excessive thirst, exessive urination, and heat or cold intolerance. Psychiatric:  Denies nervousness, depression, anxiety, and hallucinations. Skin:  Complains of changes in the skin and redness; denies poor healing, rash, and itching. HEENT:  Denies blurred or  double vision, eye pain, redness, and watering. Immunology:  Denies seasonal allergies, sinus problems, and allergic to bee stings. Hemoatologic:  Denies easy bleeding and brusing.  Physical Exam  Additional Exam:   *GEN: appearance was normal   ** CDV: normal pulses temperature and no edema  * LYMPH nodes were normal   * SKIN was normal   * Neuro: normal sensation ** Psyche: AAO x 3 and mood was normal   MSK *Gait was normal  *Inspection tenderness and swelling of the RIGHT shoulder over the previously noted cuff incision there is no redness but it is definitely warm there is a definite large joint effusion  He has limited range of motion passive and active including internal and external rotation  He has a chronically weak rotator cuff.  No instability in the shoulder.     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  Informed consent patient notes infection may come back, may lead to further surgeries.  Recommend irrigation debridement, RIGHT shoulder in the OR and take cultures and also look for Propionobacter acne  Orders: Est. Patient Level III (16109)  Patient Instructions: 1)  DIRECT ADMIT

## 2010-08-29 NOTE — Assessment & Plan Note (Signed)
Summary: 2 M RE-CK RT SHOULDER/POST OP 02/14/10/SEC HORIZ/CAF   Visit Type:  Follow-up Referring Provider:  Dr. Jeral Fruit Primary Provider:  Dr.Edward Juanetta Gosling  CC:  recheck rt shoulder.  History of Present Illness: this is a 70 year old male presents with itching.  Status post incision, drainage, and debridement, and culture, RIGHT shoulder after failure of rotator cuff repair  DOS 02/05/10. [05/08/2010]  he is really trying to get an infectious disease consult to reevaluate him, was seen. There 2 months ago.  He says he has a low-grade fever and when these infections come back. He gets itching all over his body with rashes.  She's been on 20 mg of prednisone daily since Friday, and he also received an injection from his primary care doctor, which helped somewhat.  he says, lateral, and Benadryl did not help and topical steroids did not help.  Allergies: 1)  ! Morphine 2)  ! * Metoprolol  Physical Exam  Skin:  multiple areas of macular rash is associated with erythema and mild papules over the chest and arms   Impression & Recommendations:  Problem # 1:  PRURITUS (ICD-698.9) Assessment Deteriorated  Orders: Est. Patient Level II (11914)  Medications Added to Medication List This Visit: 1)  Prednisone 20 Mg Tabs (Prednisone) .Marland Kitchen.. 1 by mouth qd  Patient Instructions: 1)  Set up an Infectious disease consult    Orders Added: 1)  Est. Patient Level II [78295]

## 2010-08-29 NOTE — Miscellaneous (Signed)
Summary: LABS LIPID,LIVER,01/09/2010  Clinical Lists Changes  Observations: Added new observation of ALBUMIN: 4.0 g/dL (04/54/0981 19:14) Added new observation of PROTEIN, TOT: 6.4 g/dL (78/29/5621 30:86) Added new observation of SGPT (ALT): 12 units/L (01/09/2010 10:39) Added new observation of SGOT (AST): 17 units/L (01/09/2010 10:39) Added new observation of ALK PHOS: 70 units/L (01/09/2010 10:39) Added new observation of BILI DIRECT: 0.1 mg/dL (57/84/6962 95:28) Added new observation of LDL: 73 mg/dL (41/32/4401 02:72) Added new observation of HDL: 42 mg/dL (53/66/4403 47:42) Added new observation of TRIGLYC TOT: 80 mg/dL (59/56/3875 64:33) Added new observation of CHOLESTEROL: 131 mg/dL (29/51/8841 66:06)

## 2010-08-29 NOTE — Miscellaneous (Signed)
Summary: HIPAA Restrictions  HIPAA Restrictions   Imported By: Florinda Marker 05/13/2010 15:33:39  _____________________________________________________________________  External Attachment:    Type:   Image     Comment:   External Document

## 2010-08-29 NOTE — Assessment & Plan Note (Signed)
Summary: RE-CK RT SHOULDER/POST OP 02/14/10/SEC HORIZ/CAF   Visit Type:  Follow-up Referring Provider:  self Primary Provider:  [name]  CC:  post op shoulder.  History of Present Illness: I saw [name] in the office today for a 3 week  followup visit.  He is a 70 years old man with the complaint of:  right shoulder  Incision and Debridement, drainage, culture right shoulder.  DOS 02/05/10. [05/08/2010]  Today is recheck on shoulder after injection, pt removed PIC line himself.  Has pain rt shoulder blade.  Percocet as needed.  No swelling in the shoulder and the incision is clen He does have some mild tenderness along the right inferior shoulder blad   There are no signs of infection     Allergies: 1)  ! Morphine   Impression & Recommendations:  Problem # 1:  RUPTURE ROTATOR CUFF (ICD-727.61) Assessment Improved  Other Orders: Post-Op Check (81191)  Patient Instructions: 1)  Please schedule a follow-up appointment in 2 months.

## 2010-08-29 NOTE — Assessment & Plan Note (Signed)
Summary: 3 mth f/u per checkout on 01/21/10/tg   Visit Type:  Follow-up Referring Provider:  . Primary Provider:  Dr.Edward Juanetta Gosling   History of Present Illness: [name] returns to the office for continued assessment and treatment of coronary disease and cardiovascular risk factors.  I have not seen him for 2 years, but he was evaluated by Ms. Lawrence, NP a few months ago for recurrent chest discomfort.  Stress nuclear study showed only minor inferior scarring, normal left ventricular systolic function and was unchanged from 2009.  Since then, he has done well with occasional mild chest discomfort or dyspnea during exertion.  He continues to do work part-time in a physically demanding job.  He notes occasional dizziness that is not necessarily orthostatic but no syncope.  He was previously treated with prednisone over a 2 year period for a skin condition, but has not had assessment of bone density.  Current Medications (verified): 1)  Doxazosin Mesylate 4 Mg Tabs (Doxazosin Mesylate) .... Take 2 Tabs Nightly 2)  Lisinopril 2.5 Mg Tabs (Lisinopril) .... Take 1 Tablet By Mouth Once Daily 3)  Celexa 20 Mg Tabs (Citalopram Hydrobromide) .... Take 1 Tab Daily 4)  Aspirin 81 Mg Tbec (Aspirin) .... Take One Tablet By Mouth Daily 5)  Pravastatin Sodium 20 Mg Tabs (Pravastatin Sodium) .... Take One Tablet By Mouth Daily At Bedtime  Allergies (verified): 1)  ! Morphine 2)  ! 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; complicated by sternal infection; 2006-total occlusion of native vessels with patent      grafts; normal EF; stress nuclear in 2009 and 2011-chronotropic incompetence; inferior scar; low normal EF Hypertension Hyperlipidemia Tobacco abuse-20 pack years; discontinued in 2000 Ventral hernia-multiple surgical procedures Gastroesophageal reflux disease Degenerative joint disease Benign prostatic  hypertrophy Gout Anxiety  Family History: Last updated: 04/24/2010 Mother-history of coronary artery disease; deceased due to neoplastic disease Father-cause of death unknown Siblings: One sister alive and well  Social History: Last updated: 04/24/2010 Married; resides in [place] Unemployed Alcohol-denies Tobacco-20 pack years; discontinued in 2000  PMH, FH, and Social History reviewed and updated.  Past Surgical History: CABG-2000; sternal infection requiring debridement and surgical closure Ventral hernia repair x 5, most recently in 2009 Cholecystectomy-2006 Colonoscopy planned for 2011  EKG  Procedure date:  04/24/2010  Findings:      Normal sinus rhythm Within normal limits Comparison with prior tracing of 10/30/1998: T Wave abnormality has resolved.   Review of Systems       Occasional atypical chest discomfort and variable exertional dyspnea; no orthopnea, PND nor pedal edema.  Vital Signs:  Patient profile:   70 year old male Weight:      208 pounds BMI:     28.31 Pulse rate:   65 / minute BP sitting:   136 / 71  (right arm)  Vitals Entered By: Dreama Saa, CNA (April 24, 2010 8:48 AM)  Physical Exam  General:  Proportionate weight and height; well developed; no acute distress:   Neck-No JVD; no carotid bruits: Lungs-No tachypnea, no rales;minor scattered expiratory rhonchi; prolonged expiratory phase; no wheezes: Cardiovascular-normal PMI; normal S1 and S2; prominent heart sounds; minimal early systolic murmur; sternal deformity following debridement and repair: Abdomen-BS normal; soft and non-tender without masses or organomegaly; aortic pulsation not palpable; no ventral hernia. 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  Care will continue to focus on optimal management of cardiovascular risk factors.  He discontinued aspirin during treatment with prednisone, but will now resume 81 mg q.d.  Problem # 2:  HYPERLIPIDEMIA (ICD-272.4) Lipid profile was excellent a few months ago off lipid-lowering drug; however, he would benefit from treatment with a statin in light of his history of coronary disease.  Pravastatin 20 mg q.d. will be started.  Problem # 3:  HYPERTENSION (ICD-401.9) Blood pressure control is good on minimal medication.  I will plan to see this nice gentleman again in one year.  Patient Instructions: 1)  Your physician recommends that you schedule a follow-up appointment in: 1 year 2)  Your physician has recommended you make the following change in your medication: restart aspirin 81mg  daily and start pravastatin 20mg  daily Prescriptions: PRAVASTATIN SODIUM 20 MG TABS (PRAVASTATIN SODIUM) Take one tablet by mouth daily at bedtime  #30 x 3   Entered by:   Teressa Lower RN   Authorized by:   Kathlen Brunswick, MD, [facility]   Signed by:   Teressa Lower RN on 04/24/2010   Method used:   Electronically to        [facility]. [phone]* (retail)       [address], [place]  [address]       Ph: [phone]       Fax: [phone]   RxID:   [id number]

## 2010-08-29 NOTE — Progress Notes (Signed)
Summary: message about Vancomycin    Phone Note Call from Patient   Summary of Call: Hazle Coca left a message at 9:39 saying  " the vancomycin they said yesterday is 1000 instead of 750".  That is all he said on the message, no names or any other information left on message.   I have left messages for him to call our office to clarify message. His cell # [phone] and home [phone] 10:39  Mr Stradling just called back and explained his message, the Vancomycin that was delivered to him today is 1000.  He has enought 750  to get him thru today, but that is all the 750.  I told him to call the nurse, Raiford Noble and let him know that the 1000 was delivered. That you spoke with him yesterday and told him you needed the 750 Initial call taken by: Jacklynn Ganong,  February 21, 2010 10:04 AM  Follow-up for Phone Call        Phone - Patient states he rec'd the #750  of Vancomycin last night and is using as per last office note. Follow-up by: Cammie Sickle,  February 22, 2010 11:50 AM

## 2010-08-29 NOTE — Consult Note (Signed)
Summary: Dr.Edward Juanetta Gosling 02/04/10  Dr.Edward Hawkins 02/04/10   Imported By: Florinda Marker 07/08/2010 08:59:07  _____________________________________________________________________  External Attachment:    Type:   Image     Comment:   External Document

## 2010-08-29 NOTE — Assessment & Plan Note (Signed)
Summary: new pt stenosis,hx of staph   Visit Type:  Consult Referring Provider:  Dr. Jeral Fruit Primary Provider:  Dr.Edward Juanetta Gosling  CC:  spinal infection.  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 No organsims were isolated and he was given a course of parenteral therapy. 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 He has not had recurrent furuncles or soft tissue infections but certainly has had major complicatoins associated with his CT and shoulder surgery. He was referred to [place] from Dr. 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  Per Dr. Cassandria Santee notes consideration is being given to posterolateral arthrodesis with autograft. He informs me today that he is now more seriously considering having steroid injections rather than surgery. I spent an hour with this pt including greater than 50% of time counselling the pt and coordinating care.  Problems Prior to Update: 1)  Atherosclerotic Cv Disease-cabg  (ICD-429.2) 2)  Hyperlipidemia  (ICD-272.4) 3)  Hypertension  (ICD-401.9) 4)  Tobacco Abuse-quit  (ICD-305.1) 5)  Chest Pain  (ICD-786.50) 6)  Rupture Rotator Cuff  (ICD-727.61) 7)  Pyogenic Arthritis, Shoulder Region  (ICD-711.01) 8)  Benign Prostatic Hypertrophy  (ICD-V13.8) 9)  Degenerative Joint Disease  (ICD-715.90) 10)  Gastroesophageal Reflux Disease  (ICD-530.81) 11)  Ventral Hernia, Incisional  (ICD-553.21)  Medications Prior to Update: 1)  Doxazosin Mesylate 4 Mg Tabs (Doxazosin Mesylate) .... Take 2 Tabs Nightly 2)  Lisinopril 2.5 Mg Tabs (Lisinopril)  .... Take 1 Tablet By Mouth Once Daily 3)  Celexa 20 Mg Tabs (Citalopram Hydrobromide) .... Take 1 Tab Daily 4)  Aspirin 81 Mg Tbec (Aspirin) .... Take One Tablet By Mouth Daily 5)  Pravastatin Sodium 20 Mg Tabs (Pravastatin Sodium) .... Take One Tablet By Mouth Daily At Bedtime  Current Medications (verified): 1)  Doxazosin Mesylate 4 Mg Tabs (Doxazosin Mesylate) .... Take 2 Tabs Nightly 2)  Lisinopril 2.5 Mg Tabs (Lisinopril) .... Take 1 Tablet By Mouth Once Daily 3)  Celexa 20 Mg Tabs (Citalopram Hydrobromide) .... Take 1 Tab Daily 4)  Aspirin 81 Mg Tbec (Aspirin) .... Take One Tablet By Mouth Daily 5)  Pravastatin Sodium 20 Mg Tabs (Pravastatin Sodium) .... Take One Tablet By Mouth Daily At Bedtime  Allergies (verified): 1)  ! Morphine 2)  ! * Metoprolol    Current Allergies (reviewed today): ! MORPHINE ! * METOPROLOL Past History:  Past Surgical History: Last updated: 04/24/2010 CABG-2000; sternal infection requiring debridement and surgical closure Ventral hernia repair x 5, most recently in 2009 Cholecystectomy-2006 Colonoscopy planned for 2011  Family History: Last updated: 04/24/2010 Mother-history of coronary artery disease; deceased due to neoplastic disease Father-cause of death unknown Siblings: One sister alive and well  Social History: Last updated: 04/24/2010 Married; resides in [place] Unemployed Alcohol-denies Tobacco-20 pack years; discontinued in 2000  Risk Factors: Alcohol Use: 0 (01/17/2009) Caffeine Use: 6 (01/17/2009)  Risk Factors: Smoking Status: never (01/17/2009)  Past Medical History: 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; complicated by sternal infection with MRSA withremoval of sternum, part of rib cage surgery by CT and plastic surgery in 2000 ; 2006-total occlusion of native vessels with patent  grafts; normal EF; stress nuclear in 2009 and 2011-chronotropic incompetence; inferior scar; low normal EF  Right shoulder cuff tear and repair,  developed infection sp surgery x 2 2 yrs ago repair of ventral hernia Pemphigoid Hypertension Hyperlipidemia Tobacco abuse-20 pack years; discontinued in 2000 Ventral hernia-multiple surgical procedures Gastroesophageal reflux disease Degenerative joint disease Benign prostatic hypertrophy Gout Anxiety  Review of Systems  The patient denies anorexia, fever, weight loss, weight gain, vision loss, decreased hearing, hoarseness, chest pain, syncope, dyspnea on exertion, peripheral edema, prolonged cough, headaches, hemoptysis, abdominal pain, melena, hematochezia, severe indigestion/heartburn, hematuria, incontinence, genital sores, muscle weakness, suspicious skin lesions, transient blindness, difficulty walking, depression, unusual weight change, abnormal bleeding, enlarged lymph nodes, angioedema, and breast masses.         endorses problems with pruritis  Vital Signs:  Patient profile:   70 year old male Height:      72 inches (182.88 cm) Weight:      207 pounds (94.09 kg) BMI:     28.18 Temp:     97.9 degrees F (36.61 degrees C) oral Pulse rate:   61 / minute BP sitting:   135 / 75  (left arm)  Vitals Entered By: Starleen Arms CMA (May 13, 2010 2:07 PM) CC: spinal infection Is Patient Diabetic? No Pain Assessment Patient in pain? no      Nutritional Status BMI of 25 - 29 = overweight Nutritional Status Detail nl  Does patient need assistance? Functional Status Self care Ambulation Normal   Physical Exam  General:  alert, well-developed, well-nourished, and well-hydrated.   Head:  normocephalic and atraumatic.   Eyes:  vision grossly intact, pupils equal, and pupils round.   Ears:  no external deformities.   Nose:  no external deformity.   Mouth:  pharynx pink and moist, no erythema, and no exudates.   Neck:  supple and full ROM.   Lungs:  normal respiratory effort, no crackles, and no wheezes.   Heart:  normal rate, regular rhythm, no murmur, and no  gallop.   Abdomen:  soft, non-tender, normal bowel sounds, no distention, and no masses.   Msk:  pt with absence of sternum, his surgical scars are well healed Extremities:  1+ left pedal edema and 1+ right pedal edema.   Neurologic:  alert & oriented X3 and gait normal.   Skin:  multiple areas of ecchymosis area of excoriation on right calf Psych:  Oriented X3 and normally interactive.     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 Certainly he has already shown an ability to succumb to very serious infectiosn with his sternal wound infection and shoulder infection. I will swab his nares to screen for MRSA and MSSA. Certainly should he consider having surgery having informatoin on MRSA, MSSA colonization would help guide properative prophylactic antibiotics. EVen if this pt tested negative by culture or PCR I would still strongly consider giving him a decontamination regimen with hibiclenz and mupirocin and giving him vancomycin in addition to cefazolin as part of his preoperative antibiotics. I encouraged him to continue his coversatoins with Dr. Jeral Fruit regarding pros and cons of surgery vs a less aggressive approach such as steroid injections and pain managment. THere are risks and benefits to every such approach  Orders: Consultation Level V (16109)  Problem # 2:  MRSA (UEA-540.98) see above discussion will swab for culture  today Orders: T-MRSA Screen [id number]) Consultation Level V [id number])  Problem # 3:  PRURITUS (ICD-698.9)  recommended claritin and benadryl vs atarax for this  Orders: Consultation Level V (91478)  Patient Instructions: 1)  We will screen you for MRSA, MSSA today 2)  If this is posiitve we can do a decontamination regimen 3)  I will send a note to Dr. Jeral Fruit  4)  I would continue to consider risks and benefits  of  surgery 5)  rtc as needed

## 2010-08-29 NOTE — Assessment & Plan Note (Signed)
Summary: RE-CK RT ARM/SHOULDER/PRE-ADM/SEC HORIZ/CAF   Visit Type:  Follow-up Referring Provider:  self Primary Provider:  [name]  CC:  check rt shoulder.  History of Present Illness: I saw [name] in the office today for a 3 week followup visit.  He is a 70 years old man with the complaint of:  right shoulder.pain swelling with possible fever  Previous history:[name] had a failed rotator cuff repair in March 20, 2009 complicated by a large joint effusion which produced negative culture.  He sagittally had irrigation debridement of the shoulder on May 11, 2009 again cultures were negative.  We noted at that time that the repair had completely come apart.  This was thought to be due to his rehabilitation with excessive forward elevation and a snapping popping sound which he felt after a period of being pain-free.  Medications: Percocet 5/325 for pain.  Today is recheck shoulder after aspiration 01/31/10.  Go over lab results and fluid culture.  Has been going for IV ATBS since 01/31/10 everyday.  Temp was 99.8 this am.  Feels nauseated, no nausea med.  Tests: (1) Culture, Body Fluid (40981) ! GRAM STAIN:               No WBC Seen (P) ! GRAM STAIN:               No Organisms Seen (P) ! PRELIM REPORT             NO GROWTH 2 DAYS (P)  ests: (1) Cell Count Synovial Fluid (19147)   Color                     YELLOW                      YELLOW ! Clarity              [A]  CLOUDY                      CLEAR   WBC                  [H]  52370 cu mm                 0-200   Neutrophil           [H]  99 %                        0-25 ! Lymphocyte                1 %                         0-20   Monocyte/Macrophage  [L]  0 %                         50-90 ! Eosinophil                0 %                         0-1  Crystals, Synovial Fluid                             No crystals seen  Current Medications (verified): 1)  Percocet 5-325  Mg Tabs  (Oxycodone-Acetaminophen) .... One By Mouth Q 4 Hrs As Needed 2)  Prednisone 5 Mg Tabs (Prednisone) .... One By Mouth Daily 3)  Doxazosin Mesylate 4 Mg Tabs (Doxazosin Mesylate) .... Take 2 Tabs Nightly 4)  Lisinopril 2.5 Mg Tabs (Lisinopril) .... Take 1 Tablet By Mouth Once Daily  Allergies (verified): 1)  ! Morphine  Past History:  Past Medical History: Last updated: 01/17/2009 htn cholesterol Immune systom suppressed blood blisters, treated with prednisone Has had bad staph infection in chest.  Past Surgical History: Last updated: 01/17/2009 open heart hernia x 5, last hernia 05/2008. staph in chest, no sternum, from after open heart. gallbladder  Family History: Last updated: 01/17/2009 Family History Coronary Heart Disease male < 73  Social History: Last updated: 01/17/2009 Patient is married.   Risk Factors: Alcohol Use: 0 (01/17/2009) Caffeine Use: 6 (01/17/2009)  Risk Factors: Smoking Status: never (01/17/2009)  Family History: Reviewed history from 01/17/2009 and no changes required. Family History Coronary Heart Disease male < 65  Social History: Reviewed history from 01/17/2009 and no changes required. Patient is married.   Review of Systems Constitutional:  Complains of fever, chills, and fatigue; denies weight loss and weight gain. Cardiovascular:  Denies chest pain, palpitations, and fainting. Respiratory:  Complains of short of breath. Gastrointestinal:  Complains of nausea; denies heartburn, vomiting, diarrhea, constipation, and blood in your stools. Genitourinary:  Denies frequency and urgency. Neurologic:  Denies numbness and tingling. Musculoskeletal:  Complains of joint pain, swelling, stiffness, redness, heat, and muscle pain; denies instability. Endocrine:  Denies excessive thirst, exessive urination, and heat or cold intolerance. Psychiatric:  Denies nervousness, depression, anxiety, and hallucinations. Skin:  Complains of changes in  the skin and redness; denies poor healing, rash, and itching. HEENT:  Denies blurred or double vision, eye pain, redness, and watering. Immunology:  Denies seasonal allergies, sinus problems, and allergic to bee stings. Hemoatologic:  Denies easy bleeding and brusing.  Physical Exam  Additional Exam:   *GEN: appearance was normal   ** CDV: normal pulses temperature and no edema  * LYMPH nodes were normal   * SKIN was normal   * Neuro: normal sensation ** Psyche: AAO x 3 and mood was normal   MSK *Gait was normal  *Inspection tenderness and swelling of the RIGHT shoulder over the previously noted cuff incision there is no redness but it is definitely warm there is a definite large joint effusion  He has limited range of motion passive and active including internal and external rotation  He has a chronically weak rotator cuff.  No instability in the shoulder.     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  Recommend irrigation debridement, RIGHT shoulder in the OR and take cultures and also look for Propionobacter acne  Orders: Est. Patient Level III (78295)  Patient Instructions: 1)  DIRECT ADMIT

## 2010-08-29 NOTE — Progress Notes (Signed)
Summary: Referral sent to [facility].  Phone Note Outgoing Call   Call placed by: Waldon Reining,  June 14, 2010 10:03 AM Call placed to: Specialist Action Taken: Information Sent Summary of Call: I faxed a referral for this patient to [facility], Dr. Sampson Goon.

## 2010-08-29 NOTE — Miscellaneous (Signed)
Summary: labs cbcd,bun,02/25/2010  Clinical Lists Changes  Observations: Added new observation of CREATININE: 1.45 mg/dL (40/98/1191 47:82) Added new observation of BUN: 20 mg/dL (95/62/1308 65:78) Added new observation of ABSOLUTE BAS: 0.0 K/uL (02/25/2010 15:58) Added new observation of BASOPHIL %: 1 % (02/25/2010 15:58) Added new observation of EOS ABSLT: 0.6 K/uL (02/25/2010 15:58) Added new observation of % EOS AUTO: 9 % (02/25/2010 15:58) Added new observation of ABSOLUTE MON: 0.8 K/uL (02/25/2010 15:58) Added new observation of MONOCYTE %: 11 % (02/25/2010 15:58) Added new observation of ABS LYMPHOCY: 1.3 K/uL (02/25/2010 15:58) Added new observation of LYMPHS %: 19 % (02/25/2010 15:58) Added new observation of PLATELETK/UL: 262 K/uL (02/25/2010 15:58) Added new observation of RDW: 15.1 % (02/25/2010 15:58) Added new observation of MCHC RBC: 33.4 g/dL (46/96/2952 84:13) Added new observation of MCV: 88.3 fL (02/25/2010 15:58) Added new observation of HCT: 33.5 % (02/25/2010 15:58) Added new observation of HGB: 11.2 g/dL (24/40/1027 25:36) Added new observation of RBC M/UL: 3.79 M/uL (02/25/2010 15:58) Added new observation of WBC COUNT: 6.7 10*3/microliter (02/25/2010 15:58)

## 2010-08-29 NOTE — Progress Notes (Signed)
Summary: PIC line removed by pt  Phone Note Outgoing Call Call back at [facility] Phone [phone]   Summary of Call: called to speak with patient to make sure he was doing ok since he removed the [facility] line himself, his wife said he was not there, that he was doing fine, advised her to relay message that I called and for him to call our office Initial call taken by: Ether Griffins,  March 19, 2010 8:52 AM  Follow-up for Phone Call        pt called back said he was feeling really good, advised for future ref, not to pull PIC line out himself, he is not having any problems at this time, Heidi Dach from [facility] to come and see him at 2 pm today. Follow-up by: Ether Griffins,  March 19, 2010 11:08 AM

## 2010-08-29 NOTE — Assessment & Plan Note (Signed)
Summary: RE-CK/POST OP 02/05/10/SEC HORIZ/CAF   Visit Type:  post op Referring Provider:  self Primary Provider:  [name]   History of Present Illness: I saw [name] in the office today for a followup visit.  He is a 70 years old man with the complaint of:  right shoulder  Incision and Debridement, drainage, culture right shoulder.  DOS 02/05/10.  IV vancomycin / He is in the midst of a 42 day course 750 mg daily.  His talk with the pharmacist.  His creatinine is up a little his pain level is 10.3 which is fine he is clinically doing well I want to keep him at 750  Patient states that he was sick last night, felt nauseated.    He's also getting breathing treatments which seemed to help his shortness of breath  Allergies: 1)  ! Morphine   Impression & Recommendations:  Problem # 1:  AFTERCARE FOLLOW SURGERY MUSCULOSKEL SYSTEM NEC (ICD-V58.78) Assessment Improved  Orders: Post-Op Check (16109)  Patient Instructions: 1)  Please schedule a follow-up appointment in 3 weeks.

## 2010-08-29 NOTE — Assessment & Plan Note (Signed)
Summary: 3 WK RE-CK RT SHOULDER/SEC HORIZ/CAF   Visit Type:  Follow-up Referring Provider:  self  CC:  right shoulder pain.  History of Present Illness: I saw [name] in the office today for a 3 week followup visit.  He is a 70 years old man with the complaint of:  right shoulder.  Previous history:[name] had a failed rotator cuff repair in March 20, 2009 complicated by a large joint effusion which produced negative culture.  He sagittally had irrigation debridement of the shoulder on May 11, 2009 again cultures were negative.  We noted at that time that the repair had completely come apart.  This was thought to be due to his rehabilitation with excessive forward elevation and a snapping popping sound which he felt after a period of being pain-free.  Medications: Percocet 5/325 and Prednisone 5mg .  Patient states his shoulder feels better, but he has pain in his shoulder blade area.  [name] says once the weather changed his shoulder got much better.  He has discomfort but no significant pain he can function within reasonable limits  His tenderness is primarily over the medial blade of the scapula.  He has painless range of motion his RIGHT shoulder the limited to less than 90 flexion and 40 external rotation  He does have a small effusion still noted over the RIGHT shoulder  No redness or tenderness around the incision  Recommend come back 3 months or sooner if problems recheck RIGHT shoulder, note also has a LEFT shoulder cuff tear see previous notes from Dr. Dion Saucier  Allergies: 1)  ! 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 Patient Level II (16109)  Problem # 2:  SHOULDER PAIN (ICD-719.41)  His updated medication list for this problem includes:    Lorcet 10/650 10-650 Mg Tabs (Hydrocodone-acetaminophen) ..... One by mouth q 4 hrs as needed pain    Percocet 5-325 Mg Tabs  (Oxycodone-acetaminophen) ..... One by mouth q 4 hrs as needed    Percocet 5-325 Mg Tabs (Oxycodone-acetaminophen) .Marland Kitchen... 1 q 4 as needed pain  Orders: Est. Patient Level II (60454)  Patient Instructions: 1)  Come back in 3 months recheck rt shoulder

## 2010-09-05 ENCOUNTER — Other Ambulatory Visit: Payer: Self-pay | Admitting: Ophthalmology

## 2010-09-05 ENCOUNTER — Encounter (HOSPITAL_COMMUNITY): Payer: Medicare Other | Attending: Ophthalmology

## 2010-09-05 DIAGNOSIS — Z01812 Encounter for preprocedural laboratory examination: Secondary | ICD-10-CM | POA: Insufficient documentation

## 2010-09-05 LAB — BASIC METABOLIC PANEL
CO2: 29 mEq/L (ref 19–32)
Chloride: 99 mEq/L (ref 96–112)
GFR calc non Af Amer: 54 mL/min — ABNORMAL LOW (ref >60)
Glucose, Bld: 167 mg/dL — ABNORMAL HIGH (ref 70–99)
Potassium: 4.3 mEq/L (ref 3.5–5.1)
Sodium: 136 mEq/L (ref 135–145)

## 2010-09-05 LAB — HEMOGLOBIN AND HEMATOCRIT, BLOOD: Hemoglobin: 13.1 g/dL (ref 13.0–17.0)

## 2010-09-09 ENCOUNTER — Ambulatory Visit (HOSPITAL_COMMUNITY)
Admission: RE | Admit: 2010-09-09 | Discharge: 2010-09-09 | Disposition: A | Discharge status: Home / Self Care | Payer: Medicare Other | Source: Ambulatory Visit | Attending: Ophthalmology | Admitting: Ophthalmology

## 2010-09-09 DIAGNOSIS — I10 Essential (primary) hypertension: Secondary | ICD-10-CM | POA: Insufficient documentation

## 2010-09-09 DIAGNOSIS — H2510 Age-related nuclear cataract, unspecified eye: Secondary | ICD-10-CM | POA: Insufficient documentation

## 2010-09-09 DIAGNOSIS — Z951 Presence of aortocoronary bypass graft: Secondary | ICD-10-CM | POA: Insufficient documentation

## 2010-09-09 DIAGNOSIS — Z79899 Other long term (current) drug therapy: Secondary | ICD-10-CM | POA: Insufficient documentation

## 2010-09-09 DIAGNOSIS — IMO0002 Reserved for concepts with insufficient information to code with codable children: Secondary | ICD-10-CM | POA: Insufficient documentation

## 2010-09-16 ENCOUNTER — Other Ambulatory Visit (HOSPITAL_COMMUNITY): Payer: Self-pay | Admitting: Pulmonary Disease

## 2010-09-16 ENCOUNTER — Ambulatory Visit (HOSPITAL_COMMUNITY)
Admission: RE | Admit: 2010-09-16 | Discharge: 2010-09-16 | Disposition: A | Discharge status: Home / Self Care | Payer: Medicare Other | Source: Ambulatory Visit | Attending: Pulmonary Disease | Admitting: Pulmonary Disease

## 2010-09-16 DIAGNOSIS — R918 Other nonspecific abnormal finding of lung field: Secondary | ICD-10-CM | POA: Insufficient documentation

## 2010-09-16 DIAGNOSIS — R0789 Other chest pain: Secondary | ICD-10-CM | POA: Insufficient documentation

## 2010-09-16 DIAGNOSIS — Z951 Presence of aortocoronary bypass graft: Secondary | ICD-10-CM | POA: Insufficient documentation

## 2010-10-12 LAB — DIFFERENTIAL
Lymphocytes Relative: 17 % (ref 12–46)
Lymphs Abs: 1 10*3/uL (ref 0.7–4.0)
Neutro Abs: 3.3 10*3/uL (ref 1.7–7.7)
Neutrophils Relative %: 59 % (ref 43–77)

## 2010-10-12 LAB — BASIC METABOLIC PANEL
Calcium: 9.1 mg/dL (ref 8.4–10.5)
GFR calc Af Amer: 57 mL/min — ABNORMAL LOW (ref >60)
GFR calc non Af Amer: 47 mL/min — ABNORMAL LOW (ref >60)
Potassium: 3.9 mEq/L (ref 3.5–5.1)
Sodium: 138 mEq/L (ref 135–145)

## 2010-10-12 LAB — CBC
Hemoglobin: 10.4 g/dL — ABNORMAL LOW (ref 13.0–17.0)
RBC: 3.42 MIL/uL — ABNORMAL LOW (ref 4.22–5.81)

## 2010-10-12 LAB — SEDIMENTATION RATE: Sed Rate: 70 mm/hr — ABNORMAL HIGH (ref 0–16)

## 2010-10-12 LAB — C-REACTIVE PROTEIN: CRP: 6.3 mg/dL — ABNORMAL HIGH (ref <0.6)

## 2010-10-12 LAB — VANCOMYCIN, TROUGH: Vancomycin Tr: 30.2 ug/mL (ref 10.0–20.0)

## 2010-10-13 LAB — SEDIMENTATION RATE: Sed Rate: 45 mm/hr — ABNORMAL HIGH (ref 0–16)

## 2010-10-13 LAB — CBC
HCT: 31.3 % — ABNORMAL LOW (ref 39.0–52.0)
Hemoglobin: 11.6 g/dL — ABNORMAL LOW (ref 13.0–17.0)
MCHC: 33.9 g/dL (ref 30.0–36.0)
Platelets: 277 10*3/uL (ref 150–400)
RBC: 3.78 MIL/uL — ABNORMAL LOW (ref 4.22–5.81)
RDW: 14.9 % (ref 11.5–15.5)
WBC: 6.2 10*3/uL (ref 4.0–10.5)
WBC: 9.2 10*3/uL (ref 4.0–10.5)

## 2010-10-13 LAB — DIFFERENTIAL
Basophils Absolute: 0 10*3/uL (ref 0.0–0.1)
Eosinophils Absolute: 0.4 10*3/uL (ref 0.0–0.7)
Lymphocytes Relative: 12 % (ref 12–46)
Lymphocytes Relative: 15 % (ref 12–46)
Lymphs Abs: 1.1 10*3/uL (ref 0.7–4.0)
Neutro Abs: 4.2 10*3/uL (ref 1.7–7.7)
Neutrophils Relative %: 67 % (ref 43–77)
Neutrophils Relative %: 73 % (ref 43–77)

## 2010-10-13 LAB — HIGH SENSITIVITY CRP: CRP, High Sensitivity: 83.5 mg/L — ABNORMAL HIGH

## 2010-10-13 LAB — BUN: BUN: 10 mg/dL (ref 6–23)

## 2010-10-13 LAB — WOUND CULTURE: Culture: NO GROWTH

## 2010-10-13 LAB — CREATININE, SERUM
GFR calc Af Amer: >60 mL/min (ref >60)
GFR calc non Af Amer: 53 mL/min — ABNORMAL LOW (ref >60)

## 2010-10-13 LAB — BASIC METABOLIC PANEL
BUN: 13 mg/dL (ref 6–23)
Creatinine, Ser: 1.24 mg/dL (ref 0.4–1.5)
GFR calc non Af Amer: 58 mL/min — ABNORMAL LOW (ref >60)
Potassium: 3.9 mEq/L (ref 3.5–5.1)

## 2010-10-13 LAB — VANCOMYCIN, TROUGH: Vancomycin Tr: 23.7 ug/mL — ABNORMAL HIGH (ref 10.0–20.0)

## 2010-10-13 LAB — MRSA PCR SCREENING: MRSA by PCR: NEGATIVE

## 2010-10-17 ENCOUNTER — Inpatient Hospital Stay (HOSPITAL_COMMUNITY)
Admission: RE | Admit: 2010-10-17 | Discharge: 2010-10-23 | DRG: 390 | Disposition: A | Discharge status: Home / Self Care | Payer: Medicare Other | Source: Ambulatory Visit | Attending: Pulmonary Disease | Admitting: Pulmonary Disease

## 2010-10-17 ENCOUNTER — Inpatient Hospital Stay (HOSPITAL_COMMUNITY): Payer: Medicare Other

## 2010-10-17 ENCOUNTER — Emergency Department (HOSPITAL_COMMUNITY): Payer: Medicare Other

## 2010-10-17 ENCOUNTER — Encounter (HOSPITAL_COMMUNITY): Payer: Medicare Other

## 2010-10-17 DIAGNOSIS — K56609 Unspecified intestinal obstruction, unspecified as to partial versus complete obstruction: Principal | ICD-10-CM | POA: Diagnosis present

## 2010-10-17 DIAGNOSIS — Z8614 Personal history of Methicillin resistant Staphylococcus aureus infection: Secondary | ICD-10-CM

## 2010-10-17 DIAGNOSIS — I10 Essential (primary) hypertension: Secondary | ICD-10-CM | POA: Diagnosis present

## 2010-10-17 DIAGNOSIS — I2510 Atherosclerotic heart disease of native coronary artery without angina pectoris: Secondary | ICD-10-CM | POA: Diagnosis present

## 2010-10-17 DIAGNOSIS — Z951 Presence of aortocoronary bypass graft: Secondary | ICD-10-CM

## 2010-10-17 DIAGNOSIS — IMO0002 Reserved for concepts with insufficient information to code with codable children: Secondary | ICD-10-CM

## 2010-10-17 DIAGNOSIS — L259 Unspecified contact dermatitis, unspecified cause: Secondary | ICD-10-CM | POA: Diagnosis present

## 2010-10-17 LAB — COMPREHENSIVE METABOLIC PANEL
Alkaline Phosphatase: 63 U/L (ref 39–117)
BUN: 20 mg/dL (ref 6–23)
CO2: 28 mEq/L (ref 19–32)
GFR calc non Af Amer: 45 mL/min — ABNORMAL LOW (ref >60)
Glucose, Bld: 102 mg/dL — ABNORMAL HIGH (ref 70–99)
Potassium: 3.6 mEq/L (ref 3.5–5.1)
Total Protein: 6.4 g/dL (ref 6.0–8.3)

## 2010-10-17 LAB — DIFFERENTIAL
Eosinophils Relative: 1 % (ref 0–5)
Lymphocytes Relative: 19 % (ref 12–46)
Lymphs Abs: 1.9 10*3/uL (ref 0.7–4.0)
Monocytes Absolute: 1 10*3/uL (ref 0.1–1.0)

## 2010-10-17 LAB — CBC
HCT: 38.5 % — ABNORMAL LOW (ref 39.0–52.0)
Hemoglobin: 13.3 g/dL (ref 13.0–17.0)
MCHC: 34.5 g/dL (ref 30.0–36.0)
MCV: 92.3 fL (ref 78.0–100.0)
RDW: 14 % (ref 11.5–15.5)
WBC: 10.3 10*3/uL (ref 4.0–10.5)

## 2010-10-17 LAB — HEPATIC FUNCTION PANEL
ALT: 19 U/L (ref 0–53)
AST: 24 U/L (ref 0–37)
Albumin: 3.9 g/dL (ref 3.5–5.2)
Alkaline Phosphatase: 63 U/L (ref 39–117)
Bilirubin, Direct: 0.2 mg/dL (ref 0.0–0.3)
Total Bilirubin: 1.1 mg/dL (ref 0.3–1.2)

## 2010-10-17 LAB — BASIC METABOLIC PANEL
BUN: 19 mg/dL (ref 6–23)
CO2: 25 mEq/L (ref 19–32)
Calcium: 9 mg/dL (ref 8.4–10.5)
GFR calc non Af Amer: 49 mL/min — ABNORMAL LOW (ref >60)
Glucose, Bld: 98 mg/dL (ref 70–99)
Sodium: 142 mEq/L (ref 135–145)

## 2010-10-17 LAB — LIPASE, BLOOD: Lipase: 20 U/L (ref 11–59)

## 2010-10-17 MED ORDER — IOHEXOL 300 MG/ML  SOLN
100.0000 mL | Freq: Once | INTRAMUSCULAR | Status: AC | PRN
  Administered 2010-10-17: 100 mL via INTRAVENOUS

## 2010-10-18 LAB — DIFFERENTIAL
Basophils Relative: 0 % (ref 0–1)
Eosinophils Relative: 0 % (ref 0–5)
Monocytes Absolute: 0.8 10*3/uL (ref 0.1–1.0)
Monocytes Relative: 9 % (ref 3–12)
Neutro Abs: 7.4 10*3/uL (ref 1.7–7.7)

## 2010-10-18 LAB — BASIC METABOLIC PANEL
CO2: 26 mEq/L (ref 19–32)
Calcium: 8.6 mg/dL (ref 8.4–10.5)
Creatinine, Ser: 1.4 mg/dL (ref 0.4–1.5)
GFR calc Af Amer: >60 mL/min (ref >60)
GFR calc non Af Amer: 51 mL/min — ABNORMAL LOW (ref >60)
Glucose, Bld: 97 mg/dL (ref 70–99)

## 2010-10-18 LAB — CBC
HCT: 35.8 % — ABNORMAL LOW (ref 39.0–52.0)
Hemoglobin: 11.6 g/dL — ABNORMAL LOW (ref 13.0–17.0)
MCH: 30.9 pg (ref 26.0–34.0)
MCHC: 32.4 g/dL (ref 30.0–36.0)
RDW: 14.3 % (ref 11.5–15.5)

## 2010-10-21 ENCOUNTER — Ambulatory Visit (HOSPITAL_COMMUNITY): Admission: RE | Admit: 2010-10-21 | Payer: Medicare Other | Source: Ambulatory Visit | Admitting: Ophthalmology

## 2010-10-21 NOTE — Progress Notes (Signed)
  NAMEORLIE, CUNDARI             ACCOUNT NO.:  [id number]  MEDICAL RECORD NO.:  [id number]           PATIENT TYPE:  I  LOCATION:  A305                          FACILITY:  [facility]  PHYSICIAN:  Mahamud Metts L. Juanetta Gosling, M.D.DATE OF BIRTH:  [birth year]  DATE OF PROCEDURE: DATE OF DISCHARGE:                                PROGRESS NOTE   Mr. Kulzer was admitted with a small bowel obstruction.  In addition to that he has coronary artery occlusive disease.  He has had bypass grafting.  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  He also has a severe skin disease that I do not think is fully understood but which has responded to prednisone.  He has had multiple infections as well.  He says he feels okay.  He has not had any flatus.  His temperature is 98.1, pulse 71, respirations 16, blood pressure 153/73, O2 sats 93%.  His chest is clear.  Heart is regular. Abdomen is still distended.  He has an NG tube in place and he has drained 400 mL.  Assessment then is he has small bowel obstruction.  Dr. Leticia Penna has indicated that he would like to see if obstruction will resolve spontaneously rather than having to do surgery and we are waiting for resolution.  At this point there has not been much change.  His lab work this morning shows BUN is 20, creatinine 1.4, potassium is normal at 4.3, white count 9200, hemoglobin is 11.6, platelets 190.  I am going to start him on Lovenox.  I had him on SCDs with the thought that he might be going to surgery sooner rather than later, but the wait is now, I think, his surgery will be delayed and he will have from surgery probably next week if he has had it all.     Kenyetta Wimbish L. Juanetta Gosling, M.D.     ELH/MEDQ  D:  10/18/2010  T:  10/18/2010  Job:  [id number]  Electronically Signed by [name] M.D. on 10/21/2010 08:42:50 AM

## 2010-10-21 NOTE — H&P (Signed)
  NAMEIDRIS, EDMUNDSON             ACCOUNT NO.:  [id number]  MEDICAL RECORD NO.:  [id number]           PATIENT TYPE:  I  LOCATION:  A305                          FACILITY:  [facility]  PHYSICIAN:  Surena Welge L. Juanetta Gosling, M.D.DATE OF BIRTH:  [birth year]  DATE OF ADMISSION:  10/17/2010 DATE OF DISCHARGE:  LH                             HISTORY & PHYSICAL   [name] was admitted with a small-bowel obstruction.  He came to the emergency room because of abdominal pain.  He has had multiple abdominal surgeries, had a cholecystectomy, multiple mesh hernia repairs and removal of a lot of his sternum after an infection from coronary artery bypass grafting.  He developed abdominal pain last night.  He has not had any vomiting, but he has nausea and severe pain.  His past medical history is positive for coronary artery occlusive disease, hypertension and previous hernia.  He has had multiple MRSA infections as well.  He has also had some sort of a skin rash which has had to be treated with prednisone.  Surgically, he has had a sternotomy.  He has had the coronary artery bypass grafting.  He has had hernia repairs.  SOCIAL HISTORY:  He does not smoke.  He does not use any alcohol.  He does not use any illicit drug.  He lives at home with his wife.  He is allergic to MORPHINE.  He is still on prednisone 20 mg daily.  Review of systems except as mentioned is negative.  Physical examination shows he is a well-developed, well-nourished male who does not appear to be in any acute distress, but is complaining of pain.  His pupils are reactive.  Mucous membranes are dry.  He has an NG tube in place.  His neck is supple without masses.  His pupils are reactive.  His chest is clear.  Heart is regular without gallop.  He has abdominal discomfort diffusely.  He is tender to palpation.  Bowel sounds are hyperactive.  Extremities showed no edema.  CNS is grossly intact.  His lab work suggests he has a  small-bowel obstruction and he is to be brought in for NG suction.  He is going to have surgical consultation with Dr. Leticia Penna and follow from there.    Lido Maske L. Juanetta Gosling, M.D.    ELH/MEDQ  D:  10/17/2010  T:  10/17/2010  Job:  [id number]  Electronically Signed by [name] M.D. on 10/21/2010 08:42:47 AM

## 2010-10-21 NOTE — Progress Notes (Signed)
  NAMECORDARO, MUKAI             ACCOUNT NO.:  [id number]  MEDICAL RECORD NO.:  [id number]           PATIENT TYPE:  I  LOCATION:  A305                          FACILITY:  [facility]  PHYSICIAN:  Mahalie Kanner L. Juanetta Gosling, M.D.DATE OF BIRTH:  24-Feb-1941  DATE OF PROCEDURE: DATE OF DISCHARGE:                                PROGRESS NOTE   Mr. Clayburn is admitted with a small bowel obstruction.  He has surgical consult with Dr. Leticia Penna in place.  He has had some more flatus but he has not had a bowel movement.  His exam today shows his temperature is 97.9, pulse 66, respirations 22, blood pressure listed at 176/79 but later was 150/80, O2 sat 94%.  His chest clear.  His abdomen is a little bit softer.  ASSESSMENT AND PLAN:  He has had a small bowel obstruction.  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  He has had multiple hernias, multiple hernia repairs.  He has skin problems that require chronic prednisone and my plan is for him to continue with his treatments.  I am going to hold his Lovenox in the morning and the patient needs to go to surgery.     Cathy Crounse L. Juanetta Gosling, M.D.     ELH/MEDQ  D:  10/20/2010  T:  10/20/2010  Job:  [id number]  Electronically Signed by [name] M.D. on 10/21/2010 08:42:53 AM

## 2010-10-31 LAB — CBC
HCT: 34.5 % — ABNORMAL LOW (ref 39.0–52.0)
Hemoglobin: 11.5 g/dL — ABNORMAL LOW (ref 13.0–17.0)
MCHC: 33.3 g/dL (ref 30.0–36.0)
MCV: 91.2 fL (ref 78.0–100.0)
RBC: 3.79 MIL/uL — ABNORMAL LOW (ref 4.22–5.81)

## 2010-10-31 LAB — DIFFERENTIAL
Eosinophils Absolute: 0.1 10*3/uL (ref 0.0–0.7)
Eosinophils Relative: 1 % (ref 0–5)
Lymphs Abs: 1.1 10*3/uL (ref 0.7–4.0)
Monocytes Absolute: 0.9 10*3/uL (ref 0.1–1.0)
Monocytes Relative: 11 % (ref 3–12)

## 2010-10-31 LAB — WOUND CULTURE: Culture: NO GROWTH

## 2010-10-31 LAB — BASIC METABOLIC PANEL
CO2: 30 mEq/L (ref 19–32)
Chloride: 100 mEq/L (ref 96–112)
GFR calc Af Amer: >60 mL/min (ref >60)
Sodium: 137 mEq/L (ref 135–145)

## 2010-10-31 LAB — ANAEROBIC CULTURE

## 2010-10-31 NOTE — Progress Notes (Signed)
  NAMEBRENEN, BEIGEL             ACCOUNT NO.:  [id number]  MEDICAL RECORD NO.:  [id number]           PATIENT TYPE:  I  LOCATION:  A305                          FACILITY:  [facility]  PHYSICIAN:  Kire Ferg L. Juanetta Gosling, M.D.DATE OF BIRTH:  11/27/1940  DATE OF PROCEDURE: DATE OF DISCHARGE:                                PROGRESS NOTE   HISTORY OF PRESENT ILLNESS:  Mr. Mans was admitted with a small bowel obstruction.  He had more flatus.  He has tried some clear liquid diet.  His NG tube has been clamped and he had a suppository yesterday and he had a small bowel movement after that.  He has no new complaints now.  PHYSICAL EXAMINATION:  VITAL SIGNS:  Temperature is 98.1, pulse 63, respirations 22, blood pressure 174/78, O2 sats 97%. CHEST:  Clear. HEART:  Regular. ABDOMEN:  Soft.  He looks much more comfortable.  ASSESSMENT:  He has multiple problems including a small bowel obstruction which seems to be relieved.  He has hypertension which is not as well-controlled.  He has coronary artery disease stable.  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     Karlis Cregg L. Juanetta Gosling, M.D.     ELH/MEDQ  D:  10/21/2010  T:  10/21/2010  Job:  [id number]  Electronically Signed by [name] M.D. on 10/31/2010 08:50:11 AM

## 2010-10-31 NOTE — Progress Notes (Signed)
  [name], [name]             ACCOUNT NO.:  [id number]  MEDICAL RECORD NO.:  [id number]           PATIENT TYPE:  I  LOCATION:  A305                          FACILITY:  [facility]  PHYSICIAN:  Keirstin Musil L. Juanetta Gosling, M.D.DATE OF BIRTH:  May 15, 1941  DATE OF PROCEDURE: DATE OF DISCHARGE:                                PROGRESS NOTE   [name] is admitted with small bowel obstruction.  He also has coronary disease.  He has had coronary artery bypass grafting and has had mediastinitis requiring mediastinal surgery and he has had multiple hernias.  He is much better.  He was able to tolerate a regular diet and feels okay.  He is being followed by Dr. Leticia Penna as well.  Exam this morning shows his temperature is 98.2, pulse 61, respirations 18, blood pressure 144/70, O2 sats 98%.  His chest is clear.  His heart is regular.  His abdomen is softer with positive bowel sounds.  ASSESSMENT:  He is improving.  PLAN:  I think he is probably okay to go home.  I want to clear that with Dr. Leticia Penna.  Otherwise, I will plan to let him go home later today.     Teola Felipe L. Juanetta Gosling, M.D.     ELH/MEDQ  D:  10/23/2010  T:  10/23/2010  Job:  [id number]  Electronically Signed by [name] M.D. on 10/31/2010 08:50:19 AM

## 2010-10-31 NOTE — Discharge Summary (Signed)
  NAMEJONCARLO, [name]             ACCOUNT NO.:  [id number]  MEDICAL RECORD NO.:  [id number]           PATIENT TYPE:  I  LOCATION:  A305                          FACILITY:  [facility]  PHYSICIAN:  Georgian Mcclory L. Juanetta Gosling, M.D.DATE OF BIRTH:  07-22-1941  DATE OF ADMISSION:  10/17/2010 DATE OF DISCHARGE:  LH                              DISCHARGE SUMMARY   FINAL DISCHARGE DIAGNOSES: 1. Small-bowel obstruction. 2. Status post multiple abdominal surgeries. 3. Status post including cholecystectomy, multiple mesh hernia repairs     and sternotomy. 4. Coronary artery occlusive disease, status post bypass grafting. 5. History of sternal wound infection requiring sternotomy and     multiple surgeries for that. 6. Hypertension. 7. History of previous hernia surgeries. 8. 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 9. Skin rash requiring prednisone.  HISTORY:  [name] is a 70 year old who came in with a small-bowel obstruction.  He came to the emergency room because of abdominal pain, and he has a history of multiple abdominal surgeries, hernia repairs, cholecystectomy.  He has had a sternal infection that required surgery and partial sternotomy and sternectomy, but he has not had small bowel obstruction before.  When he came to the emergency room, he was noted to have abdominal pain and abdominal distention.  He had an NG tube placed. His abdomen was tender diffusely.  Bowel sounds hyperactive.  His x-ray showed small bowel obstruction.  He had a CT that also showed small bowel obstruction.  HOSPITAL COURSE:  He was placed on NG drainage.  He had surgical consultation.  He had some problems with his blood pressure but once his NG tube was out, he was restarted on his medications and his pressure became under better control.  By the time of discharge, he was able to manage regular diet.  He was having bowel movements and did not require any surgery.  He is discharged home in improved  condition on Cardura 2 mg at bedtime, lisinopril 10 mg at bedtime, citalopram 40 mg 1/2 tablet daily, Xopenex inhaler as needed four times a day.  He will be on prednisone that he was taking at home but it is not on his medication list on the med reconciliation form.  He will be on 20 mg daily, and we will try to taper that dose down depending on how his rash does.  He says he already has medications for nausea available.  He has a mouth ulcer, so I am going to put him on some triamcinolone dental paste for that.  He has pain medication at home as mentioned.  I will go ahead and have him take some Zofran to be sure that he has something available at home for nausea.  He will be on 4 mg  q.i.d. p.r.n.  He will follow in my office in about 2 weeks, and he will follow with Dr. Leticia Penna as well.     Raegan Sipp L. Juanetta Gosling, M.D.     ELH/MEDQ  D:  10/23/2010  T:  10/23/2010  Job:  [id number]  Electronically Signed by [name] M.D. on 10/31/2010 08:50:00 AM

## 2010-10-31 NOTE — Progress Notes (Signed)
  [name], [name]             ACCOUNT NO.:  [id number]  MEDICAL RECORD NO.:  [id number]          PATIENT TYPE:  LOCATION:                                 FACILITY:  PHYSICIAN:  Liseth Wann L. Juanetta Gosling, M.D.DATE OF BIRTH:  04-17-1941  DATE OF PROCEDURE: DATE OF DISCHARGE:                                PROGRESS NOTE   [name] was admitted with a small bowel obstruction, which seems to be mostly relieved.  He has had one bowel movement.  He is passing gas. He feels better.  His abdomen is less distended and less tender and he is advanced to a full liquid diet.  PHYSICAL EXAMINATION:  VITAL SIGNS:  This morning shows temperature of 97.8, pulse 62, respirations 20, blood pressure 184/78, O2 sats 95% on room air. CHEST:  Clear. ABDOMEN:  Much softer.  Bowel sounds present and active.  ASSESSMENT:  He seems to be better.  My plan is to put him back on his antihypertensives, which he has not been getting because he has been n.p.o.  Continue with all his other treatments.  Dr. Leticia Penna will decide whether he is ready to be discharged home or not.     Jeanet Lupe L. Juanetta Gosling, M.D.     ELH/MEDQ  D:  10/22/2010  T:  10/22/2010  Job:  [id number]  Electronically Signed by [name] M.D. on 10/31/2010 08:50:14 AM

## 2010-10-31 NOTE — Progress Notes (Signed)
  NAMEHASANI, DIEMER             ACCOUNT NO.:  [id number]  MEDICAL RECORD NO.:  [id number]           PATIENT TYPE:  LOCATION:                                 FACILITY:  PHYSICIAN:  Siara Gorder L. Juanetta Gosling, M.D.DATE OF BIRTH:  09-21-1940  DATE OF PROCEDURE: DATE OF DISCHARGE:                                PROGRESS NOTE   [name] was admitted with a small bowel obstruction.  He has multiple other medical problems including coronary artery occlusive disease.  He has had bypass grafting, and he has had a mediastinal surgery because he had mediastinal infection.  He also has a dermatitis that has given him a great deal of problems and has required chronic prednisone.  Exam now shows his temperature is 98.2, pulse 76, respirations 20, blood pressure 173/68, O2 sats 92% on room air.  He still has a nasogastric tube in place, and he is still having some drainage.  His abdomen is much softer.  He said he had some flatus this morning but no bowel movement.  ASSESSMENT:  He has a small bowel obstruction.  He has multiple other medical problems which are stable at this point.  He says his itching has stopped, and my plan would be for him to continue on his treatments. Continue meds, no changes.  I am hopeful that he will go ahead and resolve his obstruction without surgery.     Ia Leeb L. Juanetta Gosling, M.D.     ELH/MEDQ  D:  10/19/2010  T:  10/19/2010  Job:  [id number]  Electronically Signed by [name] M.D. on 10/31/2010 08:50:07 AM

## 2010-11-02 LAB — COMPREHENSIVE METABOLIC PANEL
ALT: 43 U/L (ref 0–53)
Alkaline Phosphatase: 48 U/L (ref 39–117)
CO2: 27 mEq/L (ref 19–32)
Calcium: 9.3 mg/dL (ref 8.4–10.5)
Creatinine, Ser: 1.44 mg/dL (ref 0.4–1.5)
GFR calc Af Amer: 59 mL/min — ABNORMAL LOW (ref >60)
Glucose, Bld: 210 mg/dL — ABNORMAL HIGH (ref 70–99)
Total Bilirubin: 0.8 mg/dL (ref 0.3–1.2)

## 2010-11-02 LAB — CBC
MCHC: 34.5 g/dL (ref 30.0–36.0)
MCV: 95.7 fL (ref 78.0–100.0)
RBC: 3.75 MIL/uL — ABNORMAL LOW (ref 4.22–5.81)

## 2010-11-06 ENCOUNTER — Ambulatory Visit (HOSPITAL_COMMUNITY)
Admission: RE | Admit: 2010-11-06 | Discharge: 2010-11-06 | Disposition: A | Discharge status: Home / Self Care | Payer: Medicare Other | Source: Ambulatory Visit | Attending: Pulmonary Disease | Admitting: Pulmonary Disease

## 2010-11-06 ENCOUNTER — Other Ambulatory Visit (HOSPITAL_COMMUNITY): Payer: Self-pay | Admitting: Pulmonary Disease

## 2010-11-06 ENCOUNTER — Telehealth: Payer: Self-pay | Admitting: Orthopedic Surgery

## 2010-11-06 DIAGNOSIS — M25571 Pain in right ankle and joints of right foot: Secondary | ICD-10-CM

## 2010-11-06 DIAGNOSIS — M25572 Pain in left ankle and joints of left foot: Secondary | ICD-10-CM

## 2010-11-06 DIAGNOSIS — M949 Disorder of cartilage, unspecified: Secondary | ICD-10-CM | POA: Insufficient documentation

## 2010-11-06 DIAGNOSIS — M25512 Pain in left shoulder: Secondary | ICD-10-CM

## 2010-11-06 DIAGNOSIS — M899 Disorder of bone, unspecified: Secondary | ICD-10-CM | POA: Insufficient documentation

## 2010-11-06 DIAGNOSIS — M25476 Effusion, unspecified foot: Secondary | ICD-10-CM | POA: Insufficient documentation

## 2010-11-06 DIAGNOSIS — M25519 Pain in unspecified shoulder: Secondary | ICD-10-CM | POA: Insufficient documentation

## 2010-11-06 DIAGNOSIS — M25579 Pain in unspecified ankle and joints of unspecified foot: Secondary | ICD-10-CM | POA: Insufficient documentation

## 2010-11-06 DIAGNOSIS — W19XXXA Unspecified fall, initial encounter: Secondary | ICD-10-CM

## 2010-11-06 DIAGNOSIS — M25473 Effusion, unspecified ankle: Secondary | ICD-10-CM | POA: Insufficient documentation

## 2010-11-06 LAB — POCT CARDIAC MARKERS
CKMB, poc: <1 ng/mL — ABNORMAL LOW (ref 1.0–8.0)
Myoglobin, poc: 80 ng/mL (ref 12–200)
Myoglobin, poc: 83 ng/mL (ref 12–200)
Troponin i, poc: <0.05 ng/mL (ref 0.00–0.09)

## 2010-11-06 LAB — DIFFERENTIAL
Basophils Absolute: 0.1 10*3/uL (ref 0.0–0.1)
Basophils Relative: 1 % (ref 0–1)
Eosinophils Absolute: 1.3 10*3/uL — ABNORMAL HIGH (ref 0.0–0.7)
Eosinophils Relative: 16 % — ABNORMAL HIGH (ref 0–5)
Monocytes Absolute: 0.6 10*3/uL (ref 0.1–1.0)
Monocytes Relative: 7 % (ref 3–12)
Neutro Abs: 4.7 10*3/uL (ref 1.7–7.7)

## 2010-11-06 LAB — CARDIAC PANEL(CRET KIN+CKTOT+MB+TROPI)
CK, MB: 1.7 ng/mL (ref 0.3–4.0)
Relative Index: INVALID (ref 0.0–2.5)
Troponin I: 0.02 ng/mL (ref 0.00–0.06)

## 2010-11-06 LAB — BASIC METABOLIC PANEL
BUN: 18 mg/dL (ref 6–23)
CO2: 26 mEq/L (ref 19–32)
Calcium: 9.5 mg/dL (ref 8.4–10.5)
Creatinine, Ser: 1.45 mg/dL (ref 0.4–1.5)
GFR calc non Af Amer: 49 mL/min — ABNORMAL LOW (ref >60)
Glucose, Bld: 165 mg/dL — ABNORMAL HIGH (ref 70–99)

## 2010-11-06 LAB — CBC
MCHC: 35 g/dL (ref 30.0–36.0)
Platelets: 193 10*3/uL (ref 150–400)
RDW: 14.5 % (ref 11.5–15.5)

## 2010-11-06 NOTE — Telephone Encounter (Signed)
Patient called, states fell onto left shoulder, had surgery right shoulder. Requested appointment. Advised of first available, advised also to go to ER, urgent care, or primary care physician for treatment due to current appointment availability.  Patient opted to hold on scheduling here at this time and will call back.  Patient ph# [phone].

## 2010-11-12 ENCOUNTER — Encounter: Payer: Self-pay | Admitting: Orthopedic Surgery

## 2010-11-12 ENCOUNTER — Ambulatory Visit (INDEPENDENT_AMBULATORY_CARE_PROVIDER_SITE_OTHER): Payer: Medicare Other | Admitting: Orthopedic Surgery

## 2010-11-12 DIAGNOSIS — S8263XA Displaced fracture of lateral malleolus of unspecified fibula, initial encounter for closed fracture: Secondary | ICD-10-CM

## 2010-11-12 DIAGNOSIS — Z8739 Personal history of other diseases of the musculoskeletal system and connective tissue: Secondary | ICD-10-CM

## 2010-11-12 DIAGNOSIS — M25519 Pain in unspecified shoulder: Secondary | ICD-10-CM

## 2010-11-12 NOTE — Progress Notes (Signed)
Chief complaint LEFT shoulder pain RIGHT ankle pain  [name] fell and injured his ankle on 11 April.  He got an x-ray a day or so later it shows as a nondisplaced lateral malleolus fracture.  He was placed in an Aircast and that seems to be working well however, his shoulder is hurting him and he does have a history of a rotator cuff tear diagnosed by MRI at the [facility] [place] [facility] by Dr. Dion Saucier.  He says when he fell he felt like he tore something.  However there is no bleeding or bruising around the LEFT shoulder.  He did note that his range of motion seem to diminish.  The pain in the shoulder is described as worse than the ankle pain which is described as throbbing constant and 8/10.  Past Medical History  Diagnosis Date  . HTN (hypertension)    Review of systems he has a chronic itching problem treated by dermatologist and has easy bleeding.  He is also put on some weight.  His gait remains normal.  He has tenderness and swelling on the medial lateral sides of his RIGHT ankle with tenderness over the fracture the anterior collateral ligaments medial and lateral.  His drawer test is normal his eversion strength is moderate.  LEFT shoulder painful range of motion.  Passive range of motion normal.  Forward elevation 130.  The supraspinatus tendon strength 4/5.  Impingement sign positive.  Mild crepitance noted.  More pain with bringing the shoulder down and bring it out.  No pain over the a.c. Joint no tenderness but he does have pain with adduction.  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  Diagnoses #1 ankle fracture Diagnoses #2 pain LEFT shoulder Diagnosis #3 chronic LEFT rotator cuff tear  Plan continue Aircast times total of 6 weeks then x-ray RIGHT ankle  Physical therapy LEFT shoulder.  Patient does not want surgery.

## 2010-11-19 ENCOUNTER — Ambulatory Visit (HOSPITAL_COMMUNITY)
Admission: RE | Admit: 2010-11-19 | Discharge: 2010-11-19 | Disposition: A | Discharge status: Home / Self Care | Payer: Medicare Other | Source: Ambulatory Visit | Attending: Specialist | Admitting: Specialist

## 2010-11-19 DIAGNOSIS — M6281 Muscle weakness (generalized): Secondary | ICD-10-CM | POA: Insufficient documentation

## 2010-11-19 DIAGNOSIS — M25519 Pain in unspecified shoulder: Secondary | ICD-10-CM | POA: Insufficient documentation

## 2010-11-19 DIAGNOSIS — M25619 Stiffness of unspecified shoulder, not elsewhere classified: Secondary | ICD-10-CM | POA: Insufficient documentation

## 2010-11-19 DIAGNOSIS — IMO0001 Reserved for inherently not codable concepts without codable children: Secondary | ICD-10-CM | POA: Insufficient documentation

## 2010-11-21 ENCOUNTER — Ambulatory Visit (HOSPITAL_COMMUNITY)
Admission: RE | Admit: 2010-11-21 | Discharge: 2010-11-21 | Disposition: A | Discharge status: Home / Self Care | Payer: Medicare Other | Source: Ambulatory Visit | Attending: Pulmonary Disease | Admitting: Pulmonary Disease

## 2010-11-25 ENCOUNTER — Ambulatory Visit (HOSPITAL_COMMUNITY)
Admission: RE | Admit: 2010-11-25 | Discharge: 2010-11-25 | Disposition: A | Discharge status: Home / Self Care | Payer: Medicare Other | Source: Ambulatory Visit | Attending: Pulmonary Disease | Admitting: Pulmonary Disease

## 2010-11-28 ENCOUNTER — Ambulatory Visit (HOSPITAL_COMMUNITY)
Admission: RE | Admit: 2010-11-28 | Discharge: 2010-11-28 | Disposition: A | Discharge status: Home / Self Care | Payer: Medicare Other | Source: Ambulatory Visit | Attending: Specialist | Admitting: Specialist

## 2010-11-28 DIAGNOSIS — M6281 Muscle weakness (generalized): Secondary | ICD-10-CM | POA: Insufficient documentation

## 2010-11-28 DIAGNOSIS — M25519 Pain in unspecified shoulder: Secondary | ICD-10-CM | POA: Insufficient documentation

## 2010-11-28 DIAGNOSIS — IMO0001 Reserved for inherently not codable concepts without codable children: Secondary | ICD-10-CM | POA: Insufficient documentation

## 2010-11-28 DIAGNOSIS — M25619 Stiffness of unspecified shoulder, not elsewhere classified: Secondary | ICD-10-CM | POA: Insufficient documentation

## 2010-12-02 ENCOUNTER — Ambulatory Visit (HOSPITAL_COMMUNITY): Payer: Medicare Other

## 2010-12-05 ENCOUNTER — Ambulatory Visit (HOSPITAL_COMMUNITY)
Admission: RE | Admit: 2010-12-05 | Discharge: 2010-12-05 | Disposition: A | Discharge status: Home / Self Care | Payer: Medicare Other | Source: Ambulatory Visit | Attending: Pulmonary Disease | Admitting: Pulmonary Disease

## 2010-12-09 ENCOUNTER — Ambulatory Visit (HOSPITAL_COMMUNITY)
Admission: RE | Admit: 2010-12-09 | Discharge: 2010-12-09 | Disposition: A | Discharge status: Home / Self Care | Payer: Medicare Other | Source: Ambulatory Visit | Attending: Pulmonary Disease | Admitting: Pulmonary Disease

## 2010-12-10 NOTE — Op Note (Signed)
[name], [name]             ACCOUNT NO.:  [id number]   MEDICAL RECORD NO.:  [id number]          PATIENT TYPE:  OIB   LOCATION:  5120                         FACILITY:  [facility]   PHYSICIAN:  Adolph Pollack, M.D.DATE OF BIRTH:  1941-02-24   DATE OF PROCEDURE:  04/20/2008  DATE OF DISCHARGE:                               OPERATIVE REPORT   PREOPERATIVE DIAGNOSIS:  Recurrent ventral incisional hernia.   POSTOPERATIVE DIAGNOSIS:  Recurrent ventral incisional hernia.   PROCEDURES:  1. Laparoscopic lysis of adhesions for 3 hours.  2. Laparoscopic repair of recurrent ventral hernia with mesh.   SURGEON:  Adolph Pollack, MD   ASSISTANTS:  Lennie Muckle, MD and Gabrielle Dare. Janee Morn, MD   ANESTHESIA:  General.   INDICATIONS:  This 70 year old male has had 2 previous upper abdominal  incisional hernia repairs and has recurred a third time.  He has had a  partial obstruction from this.  He now presents for a laparoscopic  possible open repair.  We have discussed the procedure and the risks  extensively preoperatively.   TECHNIQUE:  He is brought to the operating room, placed supine on the  operating table, and general anesthetic was administered.  A Foley  catheter was inserted to his bladder and orogastric tube placed into his  stomach.  The hair on the abdominal wall was clipped.  The abdominal  wall was sterilely prepped and draped and I marked the palpable fascial  defect.   In the left lateral mid abdomen, an incision was made through the skin  and subcutaneous tissue.  I then made incision through all fascial  layers in the peritoneum entering the peritoneal cavity under direct  vision.  A Hasson trocar was introduced into the peritoneal cavity and  pneumoperitoneum created by insufflation of CO2 gas.  Next, laparoscope  was introduced and I noted significant adhesions between the omentum and  anterior abdominal wall as well as the small bowel and the anterior  abdominal wall, basically beginning at below the level of the umbilicus  and going superiorly.  I was able to put a 10-mm trocar into the left  upper quadrant incision in the anterior axillary line.  Using careful  sharp dissection, I began a slow process of carefully dividing thin  adhesive tissue between the omentum and some of small bowel and the  abdominal wall slowly freeing up these adhesions.  I came to a point  where I made a window, so I could see onto the right side.  I then  placed two 10-mm trocars on the right side, one in the right upper  quadrant, one in the right mid abdomen.  I then began doing some  adhesiolysis of some small bowel adhesions to the anterior abdominal  wall.  I basically stayed very close to the anterior abdominal wall  leaving some of the peritoneum on the bowel.  I divided some adhesions  between the omentum and the anterior abdominal wall in the right upper  quadrant as well.  I then went back onto the left side and freed up some  adhesions between the  small bowel and anterior abdominal wall mid level  of the umbilicus.  After I had done this, it allowed me to place a 5-mm  trocar just to the left of the lower midline.  This allowed me to  actually identified the hernia and began dissecting the omentum free  from the hernia sac using the harmonic scalpel to divide it and dissect  it free from the anterior wall.  There were significant small bowel  adhesions up into the hernia sac as well.  I then added another 10-mm  trocar in the left upper quadrant midclavicular line, which allowed me  better visualization.  I continued to go back and forth from left to  right side slowly dividing adhesions between omentum and abdominal wall  and omentum and hernia sac.  I then reached a point where I cleared  enough bowel off the abdominal wall in the periumbilical area to allow  for 4-5 cm overlap.  I was able to identify the left lateral rim of the  hernia as well  as the superior portion and part of the inferior portion.  I then approached the dense adhesions between the small intestine and  the hernia sac itself and some of which were up in the hernia.  I  carefully divided these sharply using careful blunt sweeping action to  reproduce these and eventually free up the small bowel, so it was able  to drop back into the peritoneal cavity.  This entire process took 3  hours.  I carefully inspected the small bowel multiple times after this.  No enterotomies were made.  There was no bleeding at this time.   Following this, I marked the periphery of the defect using the spinal  needle and marking pen.  I then measured 3-4 cm lateral to these points  and brought a piece of 20 cm x 25 cm Parietex mesh with a nonadherent  barrier into the field.  I placed 8 anchoring sutures of #1 Novofil  around the periphery of the mesh.  I then hydrated the mesh, rolled it  up, and placed into the abdominal cavity and unrolled the mesh with the  nonadherent barrier facing the viscera.  I made 8 stab wounds around the  periphery of the upper abdomen and brought up these anchoring sutures  across the fascial bridge tying them down initially anchoring the mesh  to the abdominal wall with good overlap.  I then further anchored the  mesh with spiral tacker in both an outer circular layer and an inner  circular layer to allow for extra fixation.  I then inspected the area  and the fixation was adequate with good overlap.   I once again inspected all the intestine and noted no injury or leakage  of intestinal contents.  There was no bleeding.   I removed the Hasson trocar and then used a 0-Vicryl with the Endoloop  to close this fascial defect.  The remaining trocars were removed and  the pneumoperitoneum was released.   All skin incisions were closed with 4-0 Monocryl subcuticular stitches  followed by Steri-Strips and sterile dressings.   He tolerated the procedure  well without any apparent complications and  was taken to recovery in satisfactory condition.      Adolph Pollack, M.D.  Electronically Signed     TJR/MEDQ  D:  04/20/2008  T:  04/21/2008  Job:  [id number]

## 2010-12-10 NOTE — H&P (Signed)
NAMEADDISON, [name]             ACCOUNT NO.:  [id number]   MEDICAL RECORD NO.:  [id number]          PATIENT TYPE:  INP   LOCATION:  5151                         FACILITY:  [facility]   PHYSICIAN:  Maisie Fus A. Cornett, M.D.DATE OF BIRTH:  04-28-1941   DATE OF ADMISSION:  03/19/2008  DATE OF DISCHARGE:                              HISTORY & PHYSICAL   CHIEF COMPLAINT:  Abdominal pain.   HISTORY OF PRESENT ILLNESS:  The patient is a pleasant 70 year old male  who is patient of Avel Peace scheduled for complex repair of  ventral hernia next month.  He awoke this morning 3 o'clock with crampy  abdominal pain.  The pain was constant so he sought care in the  hospital.  A CT scan was obtained, which shows a small bowel obstruction-  type pattern.  Last bowel movement was this morning.  He denies any  significant nausea or vomiting.  The pain is anywhere from 7-10 and 10  is crampy in nature throughout his abdomen.  He requested transfer to  [facility] since he did not wish to be treated at [facility].  He was  transferred down by the EMS this afternoon in stable condition.  His  chief complaint now is diffuse abdominal pain.  It is crampy in nature,  very much like gas pain, he states.  He has not really passed any gas  since this morning.  He denies any nausea or vomiting.  The pain is  7/10, crampy in nature, diffuse without radiation.  CT scan shows small  bowel obstruction pattern with a large complex ventral hernia.  The  hernia though does not appear to be incarcerated.  There is no evidence  of any thickened bowel loops nor any free fluids on the CT.   PAST MEDICAL HISTORY:  1. Severe coronary artery disease with history of CABG and sternal      wound infection.  Most recent nuclear medicine study shows low      operative risk, but was an abnormal nuclear medicine study, which      was done 2 weeks ago.  2. Hypertension.  3. History of multiple abdominal surgeries for ventral  hernias.  4. Open cholecystectomy.  5. Cardiac cath in 2006, which shows severe disease of native vessels      with his grafts being all patent with preserved myocardial      infarction.   PAST SURGICAL HISTORY:  1. CABG.  2. Sternal wound infection with debridement.  3. Multiple ventral hernia repairs, most recent in 2007 at [facility] with Dr. Katrinka Blazing.  4. Open cholecystectomy.   ALLERGIES:  None.   MEDICATIONS:  Unknown but by records he is on simvastatin, aspirin, and  a beta-blocker, it looks like.  He does not remember any of his  medications.   FAMILY HISTORY:  Noncontributory.   SOCIAL HISTORY:  He denies tobacco or alcohol use.  He is semi-retired.   REVIEW OF SYSTEMS:  Please see above, otherwise, negative x15.   PHYSICAL EXAMINATION:  VITAL SIGNS:  Temperature 96.7, heart rate 77,  and blood pressure 109/63.  GENERAL APPEARANCE:  Pleasant male, in no apparent distress.  HEENT:  Extraocular movements are intact.  Oropharynx clear.  NECK:  Supple, nontender.  Trachea midline.  CHEST:  Lung sounds are clear bilaterally.  A midline scar noted from  previous surgery.  ABDOMEN:  Distended with a very large distended ventral hernia that  appears complex.  The hernia itself is quite soft, does reduce.  Most of  his pain is more in his right lower quadrant area.  His abdomen is soft  without rebound or guarding, in mild-to-moderate discomfort with  palpation.  He is distended.  Bowel sounds are present.  There is no  redness of the skin.  EXTREMITIES:  Multiple scars noted from previous surgery.  Otherwise, no  swelling.  Muscle tone is normal.   DIAGNOSTIC STUDIES:  I have reviewed his abdominopelvic CT scan, which  shows a very complex large ventral hernia.  There are multiple dilated  small bowel loops going in and out of hernia.  Distal small bowel  appears decompressed.  Colon is decompressed.  There is no free fluid in  the abdominal cavity.  No  evidence of thickened loops of bowel or free  air.   His has a white count of 9100 without left shift, hemoglobin 13.6, blood  count 167,000.  Sodium 136, potassium 4.7, chloride 102, CO2 27, BUN 20,  creatinine 1.3, glucose 121.  LFTs were normal with albumin of 3.8.   IMPRESSION:  Multiple medical problems with complex ventral hernia, now  with partial-to-complete small bowel obstruction.   PLAN:  He will be admitted for IV hydration.  At this point in time, he  is having no nausea or vomiting.  He will hold off on the nasogastric  tube for now, and just keep him n.p.o.  His white count is normal.  He  has no left shift.  His hernia is reducible.  This could just be from  adhesions.  I would like Dr. Jacinto Halim to see the patient in the  hospital, but he does not need any acute surgery at this point in time.  We will follow him closely.  Of note, he has no signs of incarceration  or compromised bowel.      Thomas A. Cornett, M.D.  Electronically Signed     TAC/MEDQ  D:  03/19/2008  T:  03/20/2008  Job:  [id number]

## 2010-12-10 NOTE — Discharge Summary (Signed)
NAMEAHMAN, DUGDALE             ACCOUNT NO.:  [id number]   MEDICAL RECORD NO.:  [id number]          PATIENT TYPE:  OBV   LOCATION:  A336                          FACILITY:  [facility]   PHYSICIAN:  [name]. Juanetta Gosling, M.D.DATE OF BIRTH:  11-01-1940   DATE OF ADMISSION:  DATE OF DISCHARGE:  04/04/2010LH                               DISCHARGE SUMMARY   FINAL DISCHARGE DIAGNOSES:  1. Chest pain, myocardial infarction ruled out.  2. Coronary artery occlusive disease status post bypass grafting.  3. Infected sternum, status post open packing 4-5 years ago.  4. History of methicillin-resistant Staphylococcus aureus infection.  5. Rash possibly related to methicillin-resistant Staphylococcus      aureus.  6. Hypertension.  7. History of multiple abdominal and chest hernias, requiring surgery.  8. History of cholecystectomy.   Mr. Polivka has had abdominal and chest discomfort.  He has been in his  usual state of fairly good health, but he developed chest discomfort and  some nausea.  He had some heartburn as well.  He has had a cardiac  catheterization done in 2006, which shows that he had severe disease of  his native blood vessels, but his grafts were okay.  He has had sternal  wound infection and had open packing, and he has had multiple  complications with multiple hernias from that.  His exam shows that he  has a diffuse rash on his skin.  This is being treated for months and  has not resolved.  His chest is pretty clear.  Blood pressure 142/91,  pulse in the 60s.  He is afebrile.  Cardiac markers x2 in the emergency  room were negative.  White blood count 8000, BUN 18, and creatinine  1.45.   HOSPITAL COURSE:  He was admitted, ruled out for myocardial infarction,  and was able to be discharged home the next day.  He is going to be on  his regular medications and follow up with [facility].  He is  going to be on aspirin 325 mg daily, Cardura two 4 mg tablets daily,  simvastatin 80 mg daily, doxycycline 100 mg b.i.d., prednisone 10 mg  daily, and citalopram 40 mg daily.      [name]. Juanetta Gosling, M.D.  Electronically Signed     ELH/MEDQ  D:  10/29/2008  T:  10/30/2008  Job:  [id number]

## 2010-12-10 NOTE — H&P (Signed)
[name], [name]             ACCOUNT NO.:  [id number]   MEDICAL RECORD NO.:  [id number]          PATIENT TYPE:  OBV   LOCATION:  A336                          FACILITY:  [facility]   PHYSICIAN:  [name]. Juanetta Gosling, M.D.DATE OF BIRTH:  12-21-1940   DATE OF ADMISSION:  10/28/2008  DATE OF DISCHARGE:  LH                              HISTORY & PHYSICAL   HISTORY:  Mr. Buenger is being brought in for observation because of  abdominal discomfort.  He is a 70 year old as a complicated medical  history including a severe coronary artery occlusive disease with bypass  grafting and the sternal wound infection that required opening of his  sternum in packing.  He has hypertension, history of multiple abdominal  surgeries, and chest surgeries for hernias.  He has had a  cholecystectomy.  Last cardiac catheterization was in 2006 which showed  severe disease of his native vessel, but his grafts were okay.  He has  had a rash which is thought to be MRSA and is diffuse and very difficult  for him to get rid of.  He says he just does not feel very well.  He  came to the emergency for that of chest discomfort and then he had some  back pain associated with it.  He says that it has resolved.  He was not  doing anything when the pain started.  He did have some shortness of  breath.  No nausea and maybe some diaphoresis associated with it.   PAST MEDICAL HISTORY:  Positive for coronary disease, hypertension,  abdominal surgeries, and he has had sternal wound infection, and a  cholecystectomy, and then several surgeries for ventral hernias.   ALLERGIES:  He has no known drug allergies.   FAMILY HISTORY:  His mother did have coronary disease, but did not die  from that and died from cancer instead.  His father died from natural  causes.   SOCIAL HISTORY:  Shows that he lives in [place].  He is married.  He  lives at home with his wife.  He does not use tobacco, alcohol, or any  illicit  substances.   REVIEW OF SYSTEMS:  Except as mentioned is negative.   PHYSICAL EXAMINATION:  GENERAL:  Shows a well-developed, well-nourished  male, who does not appear to be in any acute distress now.  VITAL SIGNS:  His blood pressure is 142/91, pulse is in the 60s.  He is  afebrile.  HEENT:  Shows, his pupils are reactive.  Nose and throat are clear.  NECK:  Supple without masses.  CHEST:  Clear without wheezes, rales, or rhonchi.  HEART:  Regular without murmur, gallop, or rub.  ABDOMEN:  Soft.  No masses felt.  He has multiple surgical scars across  his chest and abdomen.  His bowel sounds are present and active.  EXTREMITIES:  Showed no edema.  CENTRAL NERVOUS SYSTEM:  Grossly intact.   LABORATORY WORK:  Cardiac markers x2 in the emergency room and negative.  CBC shows white count 8000, hemoglobin 13.4, platelets 193, BMET, BUN is  18, creatinine 1.45, glucose of 165.  Chest x-ray shows chronic blunting  of the right lateral costophrenic angle.  Right lower lobe granuloma,  otherwise clear.  BNP is 92.8.  He does say that he has an allergy to  morphine.   MEDICATIONS:  1. Aspirin, but he does not know the dose apparently 325.  2. Cardura two 4 mg tablets daily.  3. Simvastatin 80 mg daily.  4. Doxycycline 100 mg b.i.d.  5. Prednisone, I am not sure the dose of that.  6. Citalopram 40 mg daily.   ASSESSMENT:  He has had chest pain.  He does have known cardiac disease,  but he is totally pain free now.  He does not have any other source of  his problem, and I suspect that he may have had an episode of angina,  but he did not try any nitroglycerin or anything for that.  Otherwise,  he still has a rash.  I am going to treat that with his doxycycline and  cycle his cardiac enzymes and EKGs and await further information.  He  may be able to be discharged tomorrow if he remains pain-free.      [name]. Juanetta Gosling, M.D.  Electronically Signed     ELH/MEDQ  D:  10/28/2008  T:   10/28/2008  Job:  [id number]

## 2010-12-10 NOTE — H&P (Signed)
[name], LAZARUS             ACCOUNT NO.:  [id number]   MEDICAL RECORD NO.:  [id number]          PATIENT TYPE:  INP   LOCATION:  2917                         FACILITY:  [facility]   PHYSICIAN:  Audery Amel, MD    DATE OF BIRTH:  [birth year]   DATE OF ADMISSION:  02/11/2008  DATE OF DISCHARGE:                              HISTORY & PHYSICAL   PRIMARY CARDIOLOGIST:  Gerrit Friends. Dietrich Pates, MD, [facility]   CHIEF COMPLAINT:  Chest pain.   HISTORY OF PRESENT ILLNESS:  [name] is a 70 year old white male  with a history of coronary artery disease, status post CABG 2000, who  presents for further evaluation of chest pain.  The patient states he  has been experiencing chest pain more frequently over the last several  weeks.  This morning approximately 3:00 a.m., he was awakened from sleep  with severe chest pain.  He characterized a sharp left upper chest  discomfort which radiated to his neck and left shoulder.  His pain was  associated with shortness of breath and he denies any nausea, vomiting,  diaphoresis, or palpitations.  The patient does note that over the last  several months his exercise capacity has steadily decreased (with  decreased energy and frequently short of breath).  The patient denies  any PND, orthopnea, worsening lower extremity edema.  On arrival to  [facility], the patient continued to have chest pain which did improve  after administration of sublingual nitroglycerin.  He was transferred to  the CCU for further evaluation.  On arrival here the patient was  hemodynamically stable but continued to have 2 out of 10 chest pain.  He  was somewhat hypertensive but otherwise without complaints.   PAST MEDICAL HISTORY:  1. CAD, status post CABG in 2000.  Catheterization in January 2006      showed severe native three-vessel disease.  Patent LIMA to LAD, and      patent SVG to OM1 and D1.  2. Hypertension.  3. Hyperlipidemia.  4. History of sternal wound infection  secondary to staph.  5. Osteoarthritis   ALLERGIES:  NO KNOWN DRUG ALLERGIES.   CURRENT MEDICATIONS:  The patient is unclear of his home medications.  He does take aspirin 325 mg once daily, and Zocor.  He states that he  also takes two medications for blood pressure, but he cannot recall the  name or the doses.   SOCIAL HISTORY:  The patient lives in [place] with his wife.  He is  currently unemployed.  He denies any tobacco, alcohol or illicit  substance.   FAMILY HISTORY:  His mother had CAD but died from cancer.  His father  died from natural causes.  He has one sister who is alive and well with  no major medical problems.   REVIEW OF SYSTEMS:  As per HPI, otherwise complete review of system was  sent.  Negative except as documented.   PHYSICAL EXAMINATION:  VITAL SIGNS:  Blood pressure is 176/77, heart  rate 59, O2 sats 97% on 2 liters nasal cannula.  GENERAL:  The patient is alert and oriented  x3, in no acute distress and  pleasantly conversant.  HEENT:  Normocephalic, atraumatic.  EOMI.  PERRL.  Nares patent.  OMP is  clear without erythema or exudate.  NECK:  Supple, full range of motion, no appreciable JVD.  Carotid  upstrokes are equal and symmetric bilaterally with no audible bruit.  No  palpable thyromegaly or lymphadenopathy.  CARDIOVASCULAR:  Normal S1 and S2 with no audible murmurs, rubs or  gallops..  His PMI is nondisplaced in left midclavicular line.  His DP  and PT pulses are 1+ bilaterally.  There is a well-healed median  sternotomy scar.  LUNGS:  Occasionally expiratory wheezing.  Otherwise clear to  auscultation bilaterally.  SKIN:  No rash or lesions noted.  ABDOMEN:  Soft, nontender, positive bowel sounds.  No  hepatosplenomegaly.  GU:  Normal male genitalia.  EXTREMITIES:  No clubbing, cyanosis or edema.  No evidence of  inflammation or ulceration in his lower extremities.  MUSCULOSKELETAL:  No joint deformity effusions are appreciated.   NEUROLOGIC:  Grossly nonfocal.   LABORATORY DATA:  Chest X-Ray: Left lower lobe infiltrate versus  atelectasis.   EKG:  Normal sinus rhythm with no evidence of acute injury or ischemia.   White count 6.5, hematocrit 38, platelet count 188,000.  Sodium 137,  potassium 4.4, chloride was 7, CO2 is 26, BUN 17, creatinine 1.3,  glucose 132.  CK 75, CK-MB 1.6, troponin-I less than 0.05.   IMPRESSION:  1. Chest pain, rule out myocardial infarction.  2. Coronary artery disease.  3. Hypertension.  4. Hyperlipidemia.  5. Gastroesophageal reflux disease.  6. Osteoarthritis.  7. New left lower lobe infiltrate versus atelectasis.   PLAN:  We will admit the patient to a telemetry bed for further  monitoring evaluation.  Will cycle his cardiac biomarkers to rule out  myocardial infarction.  Given the nature of his story, there are some  typical features as well as atypical features.  My suspicion for acute  coronary syndrome is low.  We will continue to treat the patient with  aspirin 325 mg once daily.  The patient is quite hypertensive on arrival  to the CCU.  He is unsure of his home medications.  We will initiate  metoprolol 25 mg b.i.d. and lisinopril 20 mg once daily.  The patient  takes Zocor at home; however, is unclear of his dose and will treat with  40 mg once daily.  If the patient  rules out for myocardial infarction,  further consideration could be given to a noninvasive evaluation as an  outpatient.      Audery Amel, MD  Electronically Signed     SHG/MEDQ  D:  02/11/2008  T:  02/12/2008  Job:  [id number]

## 2010-12-10 NOTE — Letter (Signed)
February 18, 2008    Ramon Dredge L. Juanetta Gosling, M.D.  [address]  [place], [place] [address]   RE:  ARRINGTON, YOHE  MRN:  [id number]  /  DOB:  Mar 16, 1941   Dear Ed:   Mr. Altschuler returns to the office after recent admission to [facility] with chest pain.  Myocardial infarction was ruled out.  Plans  were made for an outpatient stress test, but this apparently was never  obtained.  He has done well since discharge with only momentary episodes  of very atypical left chest discomfort.  These are chronic and not of  great concern to him or to me.   He has not been seen in the office since 2006.  He has been traveling  for work, but is now semiretired.  He has done well with no medical  problems other than multiple surgical procedures to repair a complex  ventral hernia, none of which is planned.   CURRENT MEDICATIONS:  1. Aspirin 325 mg daily.  2. Metoprolol 25 mg b.i.d.  3. Lisinopril 20 mg daily.  4. Simvastatin 40 mg daily.   PHYSICAL EXAMINATION:  GENERAL:  A pleasant gentleman in no acute  distress.  THORAX:  Status post mediastinal debridement and repair.  NECK:  No jugular venous distention; no carotid bruits.  CARDIAC:  Normal first and second heart sounds; fourth heart sound  presents.  ABDOMEN:  Ventral hernia; soft and nontender without organomegaly.  EXTREMITIES:  1+ distal pulses; no edema; surface varicosities.   Lipid profile from recent hospitalization was suboptimal with total  cholesterol of 181, HDL of 22, triglycerides of 137 and LDL of 132.   IMPRESSION:  Mr. Ausborn has done well since coronary artery bypass  graft surgery 9 years ago.  The grafts were patent when last assessed in  2006.  His symptoms were impressive immediately prior to his recent  admission, but EKG and cardiac markers were negative.  We will proceed  with a stress nuclear study.  If negative, he should be able to safely  undergo surgery and continued medical management.  His dose  of  simvastatin will be increased to 80 mg daily.  Niacin will be added and  titrated to at least 500 mg t.i.d.  A lipid profile will be obtained in  2 months, and a return visit is anticipated in 6 months.    Sincerely,      Gerrit Friends. Dietrich Pates, MD, [facility]  Electronically Signed    RMR/MedQ  DD: 02/18/2008  DT: 02/19/2008  Job #: [id number]   CC:    Adolph Pollack, M.D.

## 2010-12-10 NOTE — Group Therapy Note (Signed)
NAMEBAYANI, RENTERIA             ACCOUNT NO.:  [id number]   MEDICAL RECORD NO.:  [id number]          PATIENT TYPE:  INP   LOCATION:  A336                          FACILITY:  [facility]   PHYSICIAN:  [name]. Juanetta Gosling, M.D.DATE OF BIRTH:  1940/12/25   DATE OF PROCEDURE:  DATE OF DISCHARGE:  10/29/2008                                 PROGRESS NOTE   Mr. Dibari is feeling okay and has no new complaints.  He has not had  anymore chest pain.  His physical exam shows his temperature 98.1, pulse  is 65, his respirations 20, blood pressure 133/80, and O2 sats 95% on  room air.  His chest is clear.  His cardiac enzymes are negative.   ASSESSMENT:  He is better.   PLAN:  He is going to be discharged home today.  He is ruled out for  myocardial infarction.  Please see discharge summary for details.      [name]. Juanetta Gosling, M.D.  Electronically Signed     ELH/MEDQ  D:  10/29/2008  T:  10/30/2008  Job:  [id number]

## 2010-12-10 NOTE — Discharge Summary (Signed)
[name], [name]             ACCOUNT NO.:  [id number]   MEDICAL RECORD NO.:  [id number]          PATIENT TYPE:  INP   LOCATION:  3705                         FACILITY:  [facility]   PHYSICIAN:  Dorian Pod, ACNP  DATE OF BIRTH:  Feb 25, 1941   DATE OF ADMISSION:  02/11/2008  DATE OF DISCHARGE:  02/13/2008                               DISCHARGE SUMMARY   DISCHARGING PHYSICIAN:  Gerrit Friends. Dietrich Pates, MD, [facility]   PRIMARY CARDIOLOGIST:  Gerrit Friends. Dietrich Pates, MD, [facility]   DISCHARGE DIAGNOSES:  1. Chest pain, negative cardiac workup this admission.  2. Hypertension, stable.  3. Dyslipidemia.  4. Sinus bradycardia, beta-blocker on hold at the time of discharge.  5. History of coronary artery disease status post coronary artery      bypass graft in 2000, most recent cath in 2006 showed severe native      three-vessel disease with patent grafts.   HOSPITAL COURSE:  [name] is a 70 year old Caucasian gentleman with  past medical history of known coronary artery disease who presented to  [facility] complaining of chest discomfort not relieved with  nitroglycerin.  He was transferred to CCU for further evaluation,  hypertensive, otherwise stable.  We were asked to evaluate the patient.  He was transferred to Telemetry here at [facility].  Cardiac enzymes  were negative, continue to remain stable; however, bradycardic on the  monitor.  On the day of discharge, the patient without complaints of  chest discomfort sinus brady in the 50s.  BUN and creatinine 20 and 1.3.  H&H 13.4 and 38.3.  Dr. Dietrich Pates __________ to see the patient.  The  patient stable.  Followup outpatient with Dr. Dietrich Pates for stress  Myoview.   At the time of discharge medications include amlodipine 5 mg a day,  lisinopril 20 a day, simvastatin 40 a day, aspirin 325 a day, and  nitroglycerin as needed.  Followup with Dr. Dietrich Pates on February 18, 2008,  as previously prescribed.   DURATION OF DISCHARGE ENCOUNTER:   Less than 30 minutes.      Dorian Pod, ACNP     MB/MEDQ  D:  03/15/2008  T:  03/16/2008  Job:  [id number]

## 2010-12-10 NOTE — Op Note (Signed)
NAMEGARVEY, WESTCOTT             ACCOUNT NO.:  [id number]   MEDICAL RECORD NO.:  [id number]          PATIENT TYPE:  INP   LOCATION:  A203                          FACILITY:  [facility]   PHYSICIAN:  Vickki Hearing, M.D.DATE OF BIRTH:  January 08, 1941   DATE OF PROCEDURE:  03/20/2009  DATE OF DISCHARGE:                               OPERATIVE REPORT   HISTORY:  A 70 year old male presented with pain in his right shoulder  for several weeks.  We gave him an injection that injection did not  help.  He came in to get an MRI.  His range of motion was worse.  He was  concerned that he could not swing his golf club or do any weed eating.  His MRI showed a complete rupture of the supraspinatus and infraspinatus  tendons, some glenoid irregularity, subscapularis, and biceps  tendinopathy, some spurring in the anterior glenoid and AC joint.  Mr.  Porter could not take the pain any more.  He wished to go ahead with  surgery and he gave [place] informed consent in the office.   PREOPERATIVE DIAGNOSIS:  Rotator cuff tear, right shoulder.   POSTOPERATIVE DIAGNOSIS:  Rotator cuff tear, right shoulder.   PROCEDURE:  Open rotator cuff repair, right shoulder.   SURGEON:  Vickki Hearing, MD   ASSISTANT:  [place] [employer].   ANESTHESIA:  General.   FINDINGS:  The humeral head was completely bear.  There was tendinopathy  of the biceps and the subscapularis which had a superior edge tear,  supraspinatus, infraspinatus for both torn.  There was retraction of the  tear with delamination in the horizontal plane.  The retraction measured  4 cm.  There were no specimens.  Minimal blood loss.  There were no  complications and the counts were correct and the procedure went as  follows.  The patient was identified in the preop area.  The right  shoulder was marked as the surgical site.  It was countersigned.  The  history and physical were reviewed, updated along with the imaging.  The  patient was taken to  surgery had general anesthesia a gram of Ancef.  He  had his right arm prepped with chlorhexidine and draped sterilely.   In the modified beach-chair position, the surgery commenced.  We  completed the time-out procedure.  We made a incision over the  anterolateral edge of the acromion and extended proximally and distally  over the [facility] joint.  It was taken down through subcutaneous tissue down  to the deltoid fascia.  The deltoid trapezial fascia was split and  subperiosteal dissected from the acromion.  We encountered immediate  synovial fluid.  We split the deltoid between his median and lateral  raphe and placed retractors.  We then mobilized the tendon by first  attaching several #2 Ethibond sutures and doing superior inferior  releases, coracohumeral release, and released from the coracoid.  This  mobilized the tendon well, but not to its original position.  We  medialized the repair.   We first used a bur to score the greater tuberosity and portion of the  articular cartilage of the humeral head.  We then placed four 6.5 suture  anchors and 3 push lock anchors to give a double row bridge repair, this  gave an excellent repair.  The patient's arm was repaired with it at his  side.   We then irrigated the wounds, closed the deltoid with #2 Ethibond suture  in interrupted fashion and the subcu tissue with 0 Monocryl.  We  injected a total of 60 mL of Marcaine with epinephrine.  We placed a  pain pump catheter.  We placed the patient in a cryo cuff and it was  extubated and taken to PACU in good condition.   The plan is for discharge to home, pending his condition in the PACU.  If he does go home, we will discharge him with Percocet, Robaxin, and an  anti-inflammatory.  If he has too much pain, we will keep him in the  hospital overnight for pain control with a PCA pump.      Vickki Hearing, M.D.  Electronically Signed     SEH/MEDQ  D:  03/20/2009  T:  03/21/2009  Job:   [id number]

## 2010-12-10 NOTE — Discharge Summary (Signed)
[name], [name]             ACCOUNT NO.:  [id number]   MEDICAL RECORD NO.:  [id number]          PATIENT TYPE:  INP   LOCATION:  A203                          FACILITY:  [facility]   PHYSICIAN:  Vickki Hearing, M.D.DATE OF BIRTH:  1941-04-07   DATE OF ADMISSION:  03/20/2009  DATE OF DISCHARGE:  08/25/2010LH                               DISCHARGE SUMMARY   ADMISSION STATUS:  Outpatient with extended recovery.   PREOPERATIVE DIAGNOSIS:  Torn right rotator cuff.   POSTOPERATIVE DIAGNOSIS:  Torn right rotator cuff.   PROCEDURE:  Open rotator cuff repair.   OPERATIVE FINDINGS:  Fourth tendon tear repaired with 3 PushLock and 4  corkscrew anchors.   The patient had severe pain after surgery and was admitted for pain  control.   The patient had itching with Dilaudid treated with Benadryl.  He has  also had mental status changes in the past with morphine.   The patient had some nervousness, was given some Xanax.   This morning he says he can go home.  He says he is going fine.  He is  neurovascularly intact, awake and alert.  He will be discharged with a  followup on March 21, 2009.   His discharge medications will be for pain we used Percocet 1-2 tablets  q.4 p.r.n. pain, Robaxin 500 mg q.6 p.r.n. spasm, ibuprofen 100 mg q.8.  He will take minocycline 100 mg 2 times a day, prednisone 20 mg daily,  dapsone 100 mg daily, doxazosin 8 mg daily, and simvastatin 80 mg daily.   CONDITION:  Improved, stable.   DISPOSITION:  Home.      Vickki Hearing, M.D.  Electronically Signed     SEH/MEDQ  D:  03/21/2009  T:  03/21/2009  Job:  [id number]

## 2010-12-12 ENCOUNTER — Ambulatory Visit (HOSPITAL_COMMUNITY)
Admission: RE | Admit: 2010-12-12 | Discharge: 2010-12-12 | Disposition: A | Discharge status: Home / Self Care | Payer: Medicare Other | Source: Ambulatory Visit | Attending: Pulmonary Disease | Admitting: Pulmonary Disease

## 2010-12-13 NOTE — Discharge Summary (Signed)
[name], [name]             ACCOUNT NO.:  [id number]   MEDICAL RECORD NO.:  [id number]          PATIENT TYPE:  INP   LOCATION:  3732                         FACILITY:  [facility]   PHYSICIAN:  Gene Serpe, P.A. [facility]   DATE OF BIRTH:  1940-10-01   DATE OF ADMISSION:  08/26/2004  DATE OF DISCHARGE:  08/28/2004                           DISCHARGE SUMMARY - REFERRING   PROCEDURE:  Cardiac catheterization August 27, 2004.   REASON FOR ADMISSION:  Mr. Colucci is a 70 year old male status post CABG  in 2000 who initially presented to [facility] emergency room  with chest  pain.  Please refer to Dr. Fabio Bering consultation note for full details.   LABORATORY DATA:  Cardiac enzymes CPK, MB, and troponin I markers (2)  normal.  Lipid profile with total cholesterol 186, triglycerides 246, HDL  27, LDL 110.  Hemoglobin 13, hematocrit 37, platelets 281 on admission,  normal electrolytes, liver functions, and liver enzymes.  Hemoglobin A1C  5.9.   HOSPITAL COURSE:  Following transfer from [facility] emergency room, the  patient was maintained on a medication regimen consisting of aspirin, beta  blocker, intravenous nitroglycerin, and heparin.  Follow up serial cardiac  markers were all normal.  Given the patient's past history and a recent  preoperative negative Cardiolite, recommendation was to proceed with cardiac  catheterization.  This was performed the following day by Dr. Lewayne Bunting  (see report for full details) revealing severe native three vessel CAD with  90% apical LAD stenosis but, otherwise, widely patent graft (SVG-OM-GX1;  RIMA-PDA; LIMA;LAD) and normal left ventricular function with no wall motion  abnormalities.  The patient was then referred for a VQ scan for evaluation  of elevated D-dimer (0.72 at [facility]).  This was negative and no further  cardiac workup was recommended.  Of note, the patient was referred for  smoking cessation and reported that he would not resume  smoking following  discharge.   MEDICATION ADJUSTMENTS THIS ADMISSION:  Addition of Lopressor and Lipitor.   DISCHARGE MEDICATIONS:  Enteric coated aspirin 81 mg daily, Lipitor 40 mg  daily, Lopressor 25 mg b.i.d., Doxazosin 4 mg b.i.d., Nitrostat 0.4 mg  p.r.n.   DISCHARGE INSTRUCTIONS:  No heavy lifting/driving for two days; call the  office if there is any swelling/bleeding of the groin.  The patient is  scheduled to follow up with Dr. Tinnie Gens [facility] on February 16 at 2  p.m.  The patient is instructed to resume follow up with [name] as  previously scheduled.   DISCHARGE DIAGNOSIS:  1.  Chest pain/known coronary artery disease.      1.  Normal serial cardiac markers.      2.  Widely patent bypass grafts with 90% apical LAD disease - cardiac          catheterization August 27, 2004.      3.  Status post CABG in 2000.      4.  Normal left ventricular function.  2.  Tobacco.  3.  Hypertension.  4.  Dyslipidemia.  5.  Elevated D-dimer.      1.  Negative ventilation/perfusion scan.      GS/MEDQ  D:  08/28/2004  T:  08/28/2004  Job:  [id number]   cc:   Ramon Dredge L. Juanetta Gosling, M.D.  [address]  [place]  [place] [address]  Fax: [phone]

## 2010-12-13 NOTE — H&P (Signed)
[name], [name]             ACCOUNT NO.:  [id number]   MEDICAL RECORD NO.:  [id number]          PATIENT TYPE:  AMB   LOCATION:  DAY                           FACILITY:  [facility]   PHYSICIAN:  Jerolyn Shin C. Katrinka [name], M.D.   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  His last  hernia repair was in 2005.  He has had multiple hernia repairs in the past.  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  He underwent total debridement of his sternum and his  sternum closed with bilateral pectoral muscle flap and great omental flap.  He developed postoperative incisional hernia that was originally repaired in  February 2001.  It was repaired twice and then again in 2003.  He has had  repairs about every 18 months.  We will try to do a repair with Matrix  Allograft, but the patient is advised that there is no guarantee that this  is going to be a permanent repair.  He has a moderate amount of pain, but  does not have any evidence of incarceration.   PAST MEDICAL HISTORY:  1.  Atherosclerotic heart disease, status post coronary bypass graft.  2.  Recent cardiac catheterization that was reportedly negative.   MEDICATIONS:  Celebrex p.r.n.  The patient did not remember other  medications.   PAST SURGICAL HISTORY:  1.  Multiple incisional hernia repairs.  2.  Coronary artery bypass graft.  3.  Surgical debridement of sternum with resection of sternum.  4.  Repair of sternal defect with pectoral muscle grafts and flaps.  5.  Cholecystectomy.   PHYSICAL EXAMINATION:  VITAL SIGNS:  Blood pressure 160/84, pulse 74,  respirations 20, weight 224 pounds.  HEENT:  Unremarkable.  NECK:  Supple.  CHEST:  Clear.  There is  absence of the sternum.  There is muscle flap and  omental flap which compassed the sternal defect.  LUNGS:  Clear.  HEART:  Regular rate and rhythm without murmurs, rubs or gallops.  ABDOMEN:  Large incisional hernia without evidence of incarceration.  EXTREMITIES:  No clubbing, cyanosis or edema.  NEUROLOGIC:  No focal motor, sensory or cerebellar deficits.   IMPRESSION:  1.  Recurrent incisional hernia.  2.  Atherosclerotic heart disease, stable.  3.  Osteoarthritis.   PLAN:  Attempt to have repair of incisional hernia with Matrix Allograft.      [name], M.D.  Electronically Signed     LCS/MEDQ  D:  09/29/2005  T:  09/29/2005  Job:  [id number]

## 2010-12-13 NOTE — Op Note (Signed)
NAMEEDGE, MAUGER                         ACCOUNT NO.:  [id number]   MEDICAL RECORD NO.:  [id number]                  PATIENT TYPE:   LOCATION:                                       FACILITY:   PHYSICIAN:  Dirk Dress. Katrinka Blazing, M.D.                DATE OF BIRTH:   DATE OF PROCEDURE:  DATE OF DISCHARGE:                                 OPERATIVE REPORT   PREOPERATIVE DIAGNOSIS:  Cholelithiasis, cholecystitis.   POSTOPERATIVE DIAGNOSIS:  Cholelithiasis, cholecystitis.   PROCEDURE:  Cholecystectomy.   SURGEON:  Dirk Dress. Katrinka Blazing, M.D.   DESCRIPTION:  Under general anesthesia the patient's abdomen was prepped and  draped in a sterile field.  A right subcostal incision was made over the  lateral portion of the subcostal region. An incision was extended through  the fascia, muscle, and into the peritoneal cavity.  The omentum was more  prominent in the upper abdomen from the omental pedicle that is being used  to cover the mediastinum.  There were some adhesions to the gallbladder.  These were taken down sharply.  The omentum was then packed off, and the  right colon was packed off.   Gallbladder adhesions were further taken down sharply.  Using electrocautery  the gallbladder was separated from the infrahepatic bed without difficulty.  The vessels were triply clipped and divided.  The cystic duct was clamped  with a right-angle clamp, divided, and the gallbladder was removed.   The cystic duct was tied with 2 ligatures of 2-0 silk and 3 staples.  Irrigation was carried out.  Hemostasis was achieved.  There was no bleeding  from the gallbladder bed.  There was no need for drain.  Sponge, needle,  instrument and blade counts were verified as correct x2.  The abdomen was  closed using #0 Prolene on both fascial layers, 2-0 Biosyn in the  subcutaneous tissue and staples on the skin.  The patient tolerated the  procedure well.  There were no intraoperative difficulties.  He was awakened  from anesthesia uneventfully, transferred to a bed, and taken to the  postanesthetic care unit for further monitoring.      ___________________________________________                                            Dirk Dress. Katrinka Blazing, M.D.   LCS/MEDQ  D:  07/20/2003  T:  07/20/2003  Job:  [id number]

## 2010-12-13 NOTE — Discharge Summary (Signed)
NAME:  [name], [name]                       ACCOUNT NO.:  [id number]   MEDICAL RECORD NO.:  [id number]                   PATIENT TYPE:  INP   LOCATION:  A319                                 FACILITY:  [facility]   PHYSICIAN:  Dirk Dress. Katrinka Blazing, M.D.                DATE OF BIRTH:  Mar 04, 1941   DATE OF ADMISSION:  07/19/2002  DATE OF DISCHARGE:  07/25/2002                                 DISCHARGE SUMMARY   DISCHARGE DIAGNOSES:  1. Recurrent incisional hernia.  2. Atherosclerotic heart disease, stable.  3. 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   PROCEDURE:  Incisional hernia repair with Vicryl mesh graft.   DISPOSITION:  The patient is discharged home in stable satisfactory  condition.   DISCHARGE MEDICATIONS:  1. Tylox one or two q.4h. p.r.n. pain.  2. Reglan 10 mg a.c. and h.s.  3. Milk of magnesia, two tablespoons daily.   FOLLOWUP:  The patient will be scheduled to be seen in the office on 08/01/02.   HISTORY OF PRESENT ILLNESS:  The patient is a 70 year old male with a  history of a large incisional hernia repaired in 2/01.  He had recurrence of  right lower quadrant in 12/01, which was repaired primarily.  He has a  history of having severe pain for about three days prior to admission, and  he was noted to have a hard mass.  The mass increased in size and then  reduced.  It occurred the night prior to admission.  He was seen in the  emergency room, but he left before he was completely evaluated.  His pain  persisted and became more severe.  He returned, and had CT scan of the  abdomen that showed recurrence in the right lower quadrant in the abdomen.  He had a thick wall of small bowel in the ______ incarcerated.  There was  potential for compromise of the vascularis and mitral valve, so he was  scheduled to have urgent exploration.   PAST MEDICAL HISTORY:  1. Coronary artery disease.  Status post coronary artery  bypass grafting in     2000.  2. Acute mediastinitis with sternal infection.  Resection of his sternum and     repair of his sternal defect with omentum and pectoralis muscle flaps.   HOSPITAL COURSE:  The patient was explored emergently.  He was found to have  a large defect of the lower end of the Marlex mesh graft on the right with  incarcerated small bowel.  The bowel was thickened, sclerotic, and  edematous, but there was no compromise of the vascular supply.  A ventral  hernia repair was done using mesh while  using Dacron patch graft.  He did well in the postoperative period.  There  were some problems with constipation, but this resolved with milk of  magnesia.  _______ on the  28th.  After that, he had good return of bowel  function.  He remained stable and was discharged home on 07/25/02, in  satisfactory condition.                                               Dirk Dress. Katrinka Blazing, M.D.    LCS/MEDQ  D:  10/07/2002  T:  10/08/2002  Job:  [id number]

## 2010-12-13 NOTE — Op Note (Signed)
NAMEOMAR, GAYDEN             ACCOUNT NO.:  [id number]   MEDICAL RECORD NO.:  [id number]          PATIENT TYPE:  INP   LOCATION:  A217                          FACILITY:  [facility]   PHYSICIAN:  Dirk Dress. Katrinka Blazing, M.D.   DATE OF BIRTH:  Dec 04, 1940   DATE OF PROCEDURE:  09/30/2005  DATE OF DISCHARGE:                                 OPERATIVE REPORT   PREOPERATIVE DIAGNOSIS:  Recurrent incisional hernia.   POSTOPERATIVE DIAGNOSIS:  Recurrent incisional hernia.   PROCEDURE:  Repair of recurrent incisional hernia with Matrix allograft and  removal of Marlex mesh.   SURGEON:  Dirk Dress. Katrinka Blazing, M.D.   DESCRIPTION OF PROCEDURE:  Under general anesthesia the patient's abdomen  was prepped and draped in a sterile field.  The old midline incision was  excised.  The incision was extended down to the mesh graft.  The  subcutaneous tissue and the skin were separated from the graft.  On  inspection the right lateral and superior aspect of the graft was totally  separated and he had essentially a complete disruption of the repair.  The  graft was dissected back to the fascia along the left side.  It was then  totally excised after it was separated from underlying omentum and bowel.   Once this was done, initially a 12 cm x 16 cm sheet of Matrix allograft was  chosen. Good fascial margins were developed circumferentially.  The superior  aspect of the graft was started at the lower rib margin on the right side.  It was extended down to the inferior aspect.  The left portion of the graft  was started at the lower rim margin and extending it down to its lowest  aspect.  There was a defect along the lower one-fourth that was closed with  a 4 x 12 cm graft.  The inferior fascia appeared to be intact.  The graft  was sewn with running #1 Prolene.  A JP drain was placed beneath the graft  and was tunneled into the left lower quadrant and tubular drains were placed  above the graft.  They were tunneled in  the right lower quadrant.  The edges  of the graft were where the large sheet and the smaller sheet met in the  lower abdomen; these were sutured closed with #1 Prolene. It appeared to be  a good snug fit.   Irrigation was carried out.  The umbilicus was sutured to the graft with 3-0  Monocryl.  The subcutaneous tissue was closed with 2-0 Monocryl. The skin  was closed with staples.  The patient tolerated the procedure well.  Dressings were placed.  Drains were secured with 3-0 nylon.  The patient was  awakened from anesthesia, transferred to a bed; and taken to the  postanesthetic care unit in satisfactory condition.      Dirk Dress. Katrinka Blazing, M.D.  Electronically Signed     LCS/MEDQ  D:  09/30/2005  T:  10/01/2005  Job:  [id number]   cc:   Ramon Dredge L. Juanetta Gosling, M.D.  Fax: [phone]

## 2010-12-13 NOTE — Op Note (Signed)
[name], [name]             ACCOUNT NO.:  [id number]   MEDICAL RECORD NO.:  [id number]          PATIENT TYPE:  INP   LOCATION:  3732                         FACILITY:  [facility]   PHYSICIAN:  Doylene Canning. Ladona Ridgel, M.D.  DATE OF BIRTH:  25-Jan-1941   DATE OF PROCEDURE:  08/27/2004  DATE OF DISCHARGE:                                 OPERATIVE REPORT   PROCEDURE PERFORMED:  Left heart catheterization with coronary angiography  and left ventriculography, left and right internal mammary artery  angiography, and saphenous vein graft angiography.   I:  INTRODUCTION:  The patient is a 70 year old man with a history of  coronary artery disease status post bypass surgery six years ago who had  recurrent chest pain consistent with unstable angina and is admitted for  catheterization.   II:  PROCEDURE:  After informed consent was obtained, the patient was taken  to the diagnostic catheterization lab in a fasting state.  After the usual  preparation and draping, a 6 French sheath was advanced into the right  femoral artery up into the left main coronary artery.  Coronary angiography  of the left main system was carried out.  The left Judkins catheter was  removed and the right Judkins catheter was advanced into the proximal aorta.  The right coronary artery was flush occluded.  The right coronary artery was  withdrawn into the saphenous vein graft to the obtuse marginal branch.  Saphenous vein graft angiography of the obtuse marginal branch was carried  out followed by saphenous vein graft angiography to the first diagonal  branch.  The right coronary artery was further withdrawn into the left  subclavian vein and selective left internal mammary angiography was carried  out.  The internal mammary catheter was then advanced into the right  subclavian artery and selectively cannulated the right right internal  mammary which was bypassed to the PDA.  This was carried out without  difficulty.  At this  point, left ventriculography was carried out with the  pigtail catheter after it was inserted retrograde across the aortic valve.  35 mL of contrast was injected at a rate of 12 mL per second.  This  confirmed flush occlusion of the right coronary artery.  The catheter was  then removed, hemostasis was assured, and the patient was returned to his  room in satisfactory condition.   III:  COMPLICATIONS:  There were no immediate procedure complications.   IV:  RESULTS:  A.  Hemodynamics.  The LV pressure was 160/15, the aortic pressure 167/81.  B.  Left ventriculography.  Left ventriculography performed in the LAO  projection with a total of 35 mL of contrast injected at a rate of 12 mL per  second.  This demonstrated normal LV systolic function with no segmental  wall motion abnormalities.  C.  Coronary angiography.  The left main coronary artery gave rise to the  LAD and the left circumflex coronary artery.  The left main was a very short  vessel.  The LAD was occluded in the proximal portion.  The left circumflex  coronary artery gave rise to one  obtuse marginal branch which was occluded  at the ostium but filled by way of the vein grafts.  A second obtuse  marginal branch was free of significant angiographic disease.  The distal  LAD which was filled by the LIMA demonstrated a 90% apical stenosis.  The  right coronary artery had a flush occlusion at its ostium.  It also had an  80% stenosis at the distal portion of the RCA just prior to the PDA  insertion site of the RIMA to the PDA.  There were two posterolateral  branches which were free of significant angiographic disease.  The right  internal mammary artery came off in the usual location and supplied the PDA.  There was retrograde flow back into the proximal and mid RCA.  The mid RCA  was a very small vessel.  There was no significant obstructive disease in  the distal PDA or PLSA branches.  The saphenous vein graft to the obtuse   marginal branch was patent without significant disease.  The saphenous vein  graft to the first diagonal branch was also patent without significant  disease.  There was some distal disease in the first diagonal branch.  The  left internal mammary artery was bypassed into the LAD.  The internal  mammary artery was free of significant disease.  As noted before, the  distal/apical LAD had a 90% stenosis.   V:  CONCLUSIONS:  1.  The study demonstrates severe native three vessel disease with a 90%      apical LAD which is not amenable to percutaneous intervention.  2.  Patent saphenous vein graft to the obtuse marginal branch as well as the      first diagonal branch.  3.  Patent right internal mammary artery to the PDA and patent LIMA to the      LAD.  4.  Preserved LV systolic function.      GWT/MEDQ  D:  08/27/2004  T:  08/27/2004  Job:  [id number]   cc:   Ramon Dredge L. Juanetta Gosling, M.D.  [address]  [place]  [place] [address]  Fax: [phone]   Learta Codding, M.D. [facility]   Charlton Haws, M.D.

## 2010-12-13 NOTE — Discharge Summary (Signed)
[name], [name]             ACCOUNT NO.:  [id number]   MEDICAL RECORD NO.:  [id number]          PATIENT TYPE:  INP   LOCATION:  A217                          FACILITY:  [facility]   PHYSICIAN:  Dirk Dress. Katrinka Blazing, M.D.   DATE OF BIRTH:  02/05/1941   DATE OF ADMISSION:  09/30/2005  DATE OF DISCHARGE:  03/13/2007LH                                 DISCHARGE SUMMARY   DISCHARGE DIAGNOSES:  1.  Recurrent incisional hernia.  2.  Atherosclerotic heart disease.  3.  Osteoarthritis.   SPECIAL PROCEDURE:  Repair of recurrent incisional hernia with matrix  allograft and removal of old Marlex mesh on 30 September 2005.   DISPOSITION:  The patient discharged home in stable and satisfactory  condition.   DISCHARGE MEDICATIONS:  1.  Tylox 1-2 every 4 hours as needed.  2.  Levaquin 750 mg daily x5 days.  3.  Reglan 10 mg before meals and at bedtime.  4.  Zelnorm 6 mg twice daily.  5.  Simvastatin 40 mg daily.  6.  Doxazosin once daily.   The patient is advised to empty his JP drains and measure once daily.  He  will be seen in the office in 2 weeks.   SUMMARY:  This is one of multiple admissions for this 70 year old male with  a history of a full recurrence of incisional hernia of his anterior  abdominal wall.  Last hernia repair was in 2005.  He has had multiple  repairs in the past.  Original repair was in February 2001.  Other specifics  are given in the admission note.  The patient was admitted to day surgery.  His old mesh graft was removed and his hernia was repaired with a cadaver  matrix allograft.  He had an uneventful post-op course, except for mild  ileus and atelectasis.  He had return of intestinal function by the fourth  postoperative day.  He did well from that point on.  There was no evidence  of infection.  He was having large volume of serous and serosanguineous  drainage but otherwise is stable.  He did very well.  He was finally  discharged on the 13th in satisfactory  condition.      Dirk Dress. Katrinka Blazing, M.D.  Electronically Signed    LCS/MEDQ  D:  12/30/2005  T:  12/30/2005  Job:  [id number]

## 2010-12-13 NOTE — Discharge Summary (Signed)
NAMEJOHNROBERT, FOTI             ACCOUNT NO.:  [id number]   MEDICAL RECORD NO.:  [id number]          PATIENT TYPE:  INP   LOCATION:  5123                         FACILITY:  [facility]   PHYSICIAN:  Adolph Pollack, M.D.DATE OF BIRTH:  01-04-1941   DATE OF ADMISSION:  04/20/2008  DATE OF DISCHARGE:  04/26/2008                               DISCHARGE SUMMARY   FINAL DIAGNOSIS:  Recurrent ventral hernia.   SECONDARY DIAGNOSES:  1. Type 2 diabetes mellitus.  2. Postoperative ileus.  3. Anxiety.  4. Hypertension.  5. Mild hypoxia.   PROCEDURES:  Laparoscopic lysis of adhesions and repair of recurrent  ventral hernia with mesh.   INDICATION:  Mr. Speas is a 70 year old male who has had a multiple  ventral hernias repaired in the past.  He has a recurrent hernia and  presented for repair.   HOSPITAL COURSE:  He underwent the above procedure.  Postoperatively, he  had some mild hypoxia that  improved with nebulizer treatments.  He was  started on sliding scale insulin for his blood sugar.  His hypoxia  improved as he became more mobile, but he did have somewhat of an ileus  and had atelectasis on chest x-ray.  We slowly started advance his diet,  gave him Dulcolax suppository.  Initially, I had him in the [facility], but he was transferred to the floor.  He had some anxiety issues  and we started some Xanax for him and put him on a Catapres patch for  his hypertension and gave him p.r.n. labetalol.  His diet was advanced,  and by sixth postoperative day, he was having bowel movements, he was  ready to go home, ileus had resolved, and the wounds looked good.   DISPOSITION:  Discharged to home on April 26, 2008.  He is given an  abdominal binder to wear and strict activity restrictions.  He is given  Percocet for pain and told to continue his home medications and will  follow up with me in the office in 2-3 weeks.  He was in satisfactory  condition.      Adolph Pollack, M.D.  Electronically Signed     TJR/MEDQ  D:  06/07/2008  T:  06/08/2008  Job:  [id number]

## 2010-12-13 NOTE — Procedures (Signed)
NAME:  ELDAR, ROBITAILLE                       ACCOUNT NO.:  [id number]   MEDICAL RECORD NO.:  [id number]                   PATIENT TYPE:  INP   LOCATION:  A301                                 FACILITY:  [facility]   PHYSICIAN:  Willa Rough, M.D.                  DATE OF BIRTH:  04-Dec-1940   DATE OF PROCEDURE:  07/18/2003  DATE OF DISCHARGE:                                  ECHOCARDIOGRAM   INDICATIONS FOR PROCEDURE:  The patient is being considered for gallbladder  surgery and this study is done for further evaluation.   2-D ECHOCARDIOGRAM:  Aorta 29 mm.   Aortic valve:  There is mild sclerosis but no stenosis.   Left atrium:  The left atrium is mild dilated at 43 mm.   Mitral valve:  Normal.   Left ventricle:  End diastolic dimension 50 mm and systolic dimension 36 mm.  Wall thickness is 12 mm. Wall motion is normal. The ejection fraction is  55%.   Right ventricle:  Normal.   Tricuspid valve:  Normal.   Pulmonary valve:  Not well seen.   Pericardial effusion:  There is no significant effusion seen.   DOPPLER ANALYSIS:  Reveals trivial tricuspid regurgitation. Right heart  pressures cannot be estimated.   COMMENTS:  This study shows that there is mild left ventricular hypertrophy.  The overall left ventricular wall motion is low normal. The ejection  fraction is 55%. There are no focal wall motion abnormalities. There are no  significant valvular abnormalities. There is mild mitral regurgitation.      ___________________________________________                                            Willa Rough, M.D.   JK/MEDQ  D:  07/18/2003  T:  07/19/2003  Job:  [id number]   cc:   Dirk Dress. Katrinka Blazing, M.D.  [address]  [place]  [place] [address]  Fax: [phone]   [place] Bing, M.D.

## 2010-12-13 NOTE — H&P (Signed)
NAME:  Jake, [name]                       ACCOUNT NO.:  [id number]   MEDICAL RECORD NO.:  [id number]                   PATIENT TYPE:  INP   LOCATION:  A301                                 FACILITY:  [facility]   PHYSICIAN:  Dirk Dress. Katrinka Blazing, M.D.                DATE OF BIRTH:  [birth year]   DATE OF ADMISSION:  07/17/2003  DATE OF DISCHARGE:                                HISTORY & PHYSICAL   HISTORY OF PRESENT ILLNESS:  [name] is a 70 year old male who presents  with recurrent abdominal pain, nausea and vomiting.  He has had persistent  nausea and vomiting for three days.  He has not had diarrhea.  He has had  some heartburn.  The patient is severely symptomatic and is admitted for  treatment.   On review of his old studies it is found that he had gallstones on previous  CT that was done in 2003.  It is probable that he has some elements of  chronic cholecystitis, but we also need to rule out peptic ulcer disease.  He will have an EGD in the morning and if his symptoms do not improve we  will proceed with an open cholecystectomy.  We probably will do an  ultrasound before the procedure to rule out acute inflammation.  The patient  has a major graft in his upper abdomen and he also has a large omental flap,  which will lead him amenable to potential complications during and after the  surgery.   PAST HISTORY:  The patient has coronary artery bypass graft done in April  2000.  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  This led to necrosis of the  sternum.  He underwent total debridement of the sternum and later closure  with bilateral pectoralis muscle flap and greater omental flap.  He  developed a postoperative incisional hernia, which was originally repaired  in February 2001.  This has been repaired twice since then.  The last repair  was December 2003.  There is no evidence of recurrent hernia at this  time,  though he has bulging of his abdominal wall due to attenuated fascia.  He is  asymptomatic from his coronary artery disease and his bypass grafting  appears to be working.  He has no other major illness.  He is very active  and he is still employed.   PAST SURGICAL HISTORY:  Surgical history is as noted above in the history of  present illness.   PHYSICAL EXAMINATION:  GENERAL APPEARANCE:  On examination he is a healthy-  appearing male who appears his stated age.  VITAL SIGNS:  Blood pressure 140/82, pulse 72, respirations 20, and weight  20 pounds.  HEENT:  Unremarkable.  NECK:  Neck is supple.  No JVD or bruits.  CHEST:  Chest is clear to auscultation.  The chest wall reveals deformity of  the pectoralis muscles bilaterally with an absent sternum and an upper  midline a defect from the omental flap in the epigastric region.  ABDOMEN:  Abdomen soft with mild epigastric and right upper quadrant  tenderness,  normal bowel sounds.  There is attenuation of the midline  incision without definite hernia.  No masses are noted.  EXTREMITIES:  No cyanosis, clubbing or edema.  NEUROLOGIC EXAMINATION:  No focal motor, sensory or cerebellar deficit.   IMPRESSION:  1. Recurrent abdominal pain, rule out gastritis or gastric ulcer.  2. Probable gallbladder disease.  3. Coronary artery disease status post coronary artery bypass graft.  4. Status post resection of sternum after mediastinitis from virulent     Staphylococcus aureus with follow up pectoralis major flap and omental     flap.  5. Multiple recurrent incisional hernias.   PLAN:  1. The patient is admitted.  2. The patient will have a CT of the abdomen.  3. EGD will be done.  4. We will get baseline labs and if symptoms persist he will have a     cholecystectomy.     ___________________________________________                                         Dirk Dress Katrinka Blazing, M.D.   LCS/MEDQ  D:  07/17/2003  T:  07/17/2003  Job:   [id number]

## 2010-12-13 NOTE — Op Note (Signed)
   NAME:  [name], [name]                       ACCOUNT NO.:  [id number]   MEDICAL RECORD NO.:  [id number]                   PATIENT TYPE:  INP   LOCATION:  A319                                 FACILITY:  [facility]   PHYSICIAN:  Dirk Dress. Katrinka Blazing, M.D.                DATE OF BIRTH:  06-22-1941   DATE OF PROCEDURE:  07/19/2002  DATE OF DISCHARGE:  07/25/2002                                 OPERATIVE REPORT   PREOPERATIVE DIAGNOSIS:  Incarcerated recurrent incisional hernia.   POSTOPERATIVE DIAGNOSIS:  Incarcerated recurrent incisional hernia.   PROCEDURE:  Incisional hernia repair with Vicryl mesh graft.   SURGEON:  Dirk Dress. Katrinka Blazing, M.D.   DESCRIPTION OF PROCEDURE:  Under general anesthesia, the patient's abdomen  was prepped and draped in a sterile field.  A lower midline incision was  made.  It was extended past the area of apparent hernia.  It was extended  into the subcutaneous tissue where a large hernia sac was encountered.  The  hernia sac was dissected.  After this was done, it was apparent that the  lower end of the Marlex mesh graft on the right side had pulled apart.  There was a large defect through which a large segment of small bowel was  densely incarcerated.  The bowel was dissected free.  It was thickened,  sclerotic, and edematous, but there was no vascular problem.  Further  dissection of the bowel was carried out, and the bowel was then reduced back  into the peritoneal cavity.  Because of the fluid in the sac, it was elected  to use Vicryl mesh to repair the defect.  The Vicryl mesh was doubled and  was sewn to the edges of the defect, encompassing the mesh graft medially  and the attenuated fascia laterally.  This was sewn in with a 0 Prolene.  Good closure was obtained, but there was some concern that because of the  attenuation of his primary fascia that he may have the potential for  recurrence.  The wound was irrigated.  The subcutaneous tissue was closed in  layers using 0 Vicryl.  The skin was closed with staples.  The patient  tolerated the procedure well.  He was awakened from anesthesia and  uneventfully transferred to a bed and taken to the postanesthetic care unit.                                               Dirk Dress. Katrinka Blazing, M.D.    LCS/MEDQ  D:  10/07/2002  T:  10/07/2002  Job:  [id number]

## 2010-12-13 NOTE — H&P (Signed)
[name], HUDLOW             ACCOUNT NO.:  [id number]   MEDICAL RECORD NO.:  [id number]          PATIENT TYPE:  INP   LOCATION:  3732                         FACILITY:  [facility]   PHYSICIAN:  Charlton Haws, M.D.     DATE OF BIRTH:  11-26-1940   DATE OF ADMISSION:  08/26/2004  DATE OF DISCHARGE:                                HISTORY & PHYSICAL   CHIEF COMPLAINT:  Chest pain.   HISTORY OF PRESENT ILLNESS:  This is a 70 year old male transferred to [facility] from [facility] emergency room for examination of  chest pain. The patient was pain free on arrival. He states he has had  intermittent chest pain times several weeks. The pain has been getting  worse. The pain has been in his left shoulder, the base of his neck, similar  to what he experienced prior to his coronary artery bypass graft surgery.  The pain also radiates to his left lower chest. He describes this as a  burning, moderate in severity, with occasional shortness of breath. No  diaphoresis. No nausea. Patient felt syncopal yesterday at one time when  standing, however this was not associated with chest pain.  His pain started  today around 5 a.m. It was the worse episode so far. He had pain off and on  all day long. He went to [facility] emergency room and at  approximately 3 p.m. he received nitroglycerin times three with some relief.  He is currently pain free.   PAST MEDICAL HISTORY:  The patient had coronary artery bypass graft surgery  in 2000. He does not remember who performed this surgery; however it was  done at [facility]. He subsequently developed a sternal staph  infection and had his sternum resected along with parts of some ribs. He  does not have diabetes. He does have hypertension, elevated cholesterol. He  had a Cardiolite in March 2005 that revealed an ejection fraction of 48%  with no ischemia. He had a chest CT in March 2005 that showed a right lung  calcified  granuloma. He had early pulmonary fibrosis noted on the chest CT.  He is status post cholecystectomy.   ALLERGIES:  No known drug allergies.   MEDICATIONS:  He is on a blood pressure medication, he does not know the  name. He takes aspirin daily.   SOCIAL HISTORY:  He is married and lives in [place]. He has three  children. He smokes a half pack per day. He does not use alcohol. He works  on English as a second language teacher. It is fairly strenuous work. He was recently in [place]  doing some work.   FAMILY HISTORY:  Both of his parents died at age 37. His mother had CABG at  age 8. His father died from CVA. He has a sister who is alive and well.   REVIEW OF SYSTEMS:  Essentially negative except for the following: He has  had some recent fevers. He has had some visual changes which are new. He has  had a rash on an abdomen. He has had some chest pain, dyspnea  on exertion, a  nonproductive cough, history of anxiety, history of gout.   PHYSICAL EXAMINATION:  GENERAL: A pleasant 70 year old white male in no  acute distress.  VITAL SIGNS: In the emergency room blood pressure 152/88, pulse 89.  HEENT: Unremarkable.  NECK: No bruits, no jugular venous distention.  HEART: Regular rate and rhythm without murmur. He has a deformity of the  chest secondary to removal of the sternum.  LUNGS: Slightly decreased, but clear.  ABDOMEN: Obese. Positive bowel sounds, soft, nontender.  EXTREMITIES: Pulses are intact without edema.  SKIN: Warm and dry.   LABORATORY DATA:  A chest x-ray apparently was not performed. EKG shows a  normal sinus rhythm with rate of 79 beats per minute with LVH. Point of care  enzymes are negative times one.  A D-dimer was elevated at 0.72. BNP was 30.  BUN 18, creatinine 1.4, potassium 4.1, sodium 133. A CBC revealed hemoglobin  of 13.5, hematocrit 39.3, WBCs 9.3 thousand, platelet count 311,000, glucose  131.   IMPRESSION:  1.  Chest pain.  2.  Coronary artery bypass graft  surgery in the year 2000. Records currently      pending. No records regarding his bypasses were found in the computer.  3.  Hypertension.  4.  Elevated cholesterol.  5.  Ongoing tobacco use.  6.  Questionable early pulmonary fibrosis.  7.  Status post multiple surgeries.  8.  Status post removal of the sternum secondary to a staph infection.  9.  History of esophageal ulcer and gastritis.  10. 2-D echo performed in 2004 revealing ejection fraction of 55%.  11. Cardiolite in March 2005 revealing ejection fraction of 48% without      ischemia.  12. Elevated D-dimer.   PLAN:  The patient is currently on IV heparin and nitroglycerin, which will  be continued. We will plan on performing a cardiac catheterization tomorrow  to further evaluate his symptoms.  We will continue to cycle enzymes. We  will obtain old charts to verify his grafts. We will check stool guaiacs and  a hemoglobin A1C.      DR/MEDQ  D:  08/26/2004  T:  08/26/2004  Job:  [id number]   cc:   Ramon Dredge L. Juanetta Gosling, M.D.  [address]  [place]  [place] [address]  Fax: [phone]

## 2010-12-13 NOTE — Discharge Summary (Signed)
NAME:  [name], [name]                       ACCOUNT NO.:  [id number]   MEDICAL RECORD NO.:  [id number]                   PATIENT TYPE:  INP   LOCATION:  A301                                 FACILITY:  [facility]   PHYSICIAN:  Dirk Dress. Katrinka Blazing, M.D.                DATE OF BIRTH:  03/26/1941   DATE OF ADMISSION:  07/17/2003  DATE OF DISCHARGE:  07/21/2003                                 DISCHARGE SUMMARY   DISCHARGE DIAGNOSES:  1. Cholelithiasis, cholecystitis.  2. Distal esophageal ulcer, antral gastritis.  3. Coronary artery disease.  4. Incisional hernia.  5. Hypertension.   PROCEDURE:  EGD on December 21, open cholecystectomy on December 23.   DISPOSITION:  The patient is discharged to home in stable and satisfactory  condition.   DISCHARGE MEDICATIONS:  1. Tylox 1 or 2 every 4h. as needed for pain.  2. Phenergan 25 mg every 4h as needed for nausea.   He is scheduled to be seen in the office in two weeks.   SUMMARY:  A 70 year old male who presents with recurrent upper abdominal  pain, nausea and vomiting.  He had nausea and vomiting for three days.  He  denied diarrhea.  He had some heartburn.  He remains severely symptomatic  despite outpatient treatment and was admitted for further investigation.  On  review of his old studies, it was noted that he had gallstones on previous  CT scan that was done in 2003.  It was felt that he probably had an element  of chronic cholecystitis but we needed also to rule out peptic ulcer  disease.  The patient also had coronary artery bypass graft with multiple  complications related to this which are outlined in the admission note. The  patient was admitted, started on IV analgesics and fluids.  Cardiac stress  test and echocardiogram were ordered.  On December 21, EGD was done. It  showed a distal esophageal ulcer with a developing Schatzki ring and antral  gastritis.  It was felt that these findings were not prominent enough to  explain the  severity of his pain, his nausea and vomiting.  Stress test and  echocardiogram were completed. The patient underwent open cholecystectomy on  December 23, this procedure was uneventful.  He had no problems.  He  tolerated food on the first postoperative day and he was rapidly advanced to  a regular diet and had not difficulty.  At his request, he was discharged  home on December 24 in satisfactory condition to be followed up as an  outpatient.     ___________________________________________                                         Dirk Dress. Katrinka Blazing, M.D.   LCS/MEDQ  D:  08/05/2003  T:  08/05/2003  Job:  [id number] 

## 2010-12-13 NOTE — H&P (Signed)
NAME:  [name], [name]                       ACCOUNT NO.:  [id number]   MEDICAL RECORD NO.:  [id number]                   PATIENT TYPE:  INP   LOCATION:  A301                                 FACILITY:  [facility]   PHYSICIAN:  Dirk Dress. Katrinka Blazing, M.D.                DATE OF BIRTH:  [birth year]   DATE OF ADMISSION:  07/17/2003  DATE OF DISCHARGE:                                HISTORY & PHYSICAL   HISTORY OF PRESENT ILLNESS:  A 70 year old male with history of recurrent  incisional hernia. He has no lower abdominal symptoms.  Because of  persistent symptoms the patient is admitted and will treat him  symptomatically. On review of his old records it is noted that he was found  to have gallstones on a CT that was done in 2003.  This may very well  represent gallbladder disease.   PAST HISTORY:  The patient has coronary artery disease and underwent  coronary artery bypass graft in April 2000. 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  This led to  necrosis of his sternum. He underwent total resection of his sternum and had  closure with bilateral pectoralis flap and a greater omental flap. He has  had postoperative incisional hernias and his abdominal incision has been  repaired twice. The last repair was done in December 2003.    Dictation ended at this point.     ___________________________________________                                         Dirk Dress. Katrinka Blazing, M.D.   LCS/MEDQ  D:  07/17/2003  T:  07/17/2003  Job:  [id number]

## 2010-12-16 ENCOUNTER — Ambulatory Visit (HOSPITAL_COMMUNITY)
Admission: RE | Admit: 2010-12-16 | Discharge: 2010-12-16 | Disposition: A | Discharge status: Home / Self Care | Payer: Medicare Other | Source: Ambulatory Visit | Attending: Pulmonary Disease | Admitting: Pulmonary Disease

## 2010-12-19 ENCOUNTER — Ambulatory Visit (HOSPITAL_COMMUNITY): Payer: Medicare Other | Admitting: Specialist

## 2010-12-24 ENCOUNTER — Ambulatory Visit (HOSPITAL_COMMUNITY): Payer: Medicare Other | Admitting: Specialist

## 2010-12-25 ENCOUNTER — Encounter: Payer: Self-pay | Admitting: Orthopedic Surgery

## 2010-12-25 ENCOUNTER — Telehealth: Payer: Self-pay | Admitting: Orthopedic Surgery

## 2010-12-25 ENCOUNTER — Ambulatory Visit (INDEPENDENT_AMBULATORY_CARE_PROVIDER_SITE_OTHER): Payer: Medicare Other | Admitting: Orthopedic Surgery

## 2010-12-25 DIAGNOSIS — S82899A Other fracture of unspecified lower leg, initial encounter for closed fracture: Secondary | ICD-10-CM

## 2010-12-25 DIAGNOSIS — S40012A Contusion of left shoulder, initial encounter: Secondary | ICD-10-CM | POA: Insufficient documentation

## 2010-12-25 DIAGNOSIS — S40019A Contusion of unspecified shoulder, initial encounter: Secondary | ICD-10-CM

## 2010-12-25 NOTE — Telephone Encounter (Signed)
Keisha/ [facility]othe[facility] said that Hazle Coca needs a prescription for a Neb Kit faxed to [phone] [place] [facility] the mouth piece that the prescription was written for is not what he needs

## 2010-12-25 NOTE — Progress Notes (Signed)
Separate x-ray report.  AP, lateral, and oblique view, RIGHT ankle.  Reason for x-ray followup fracture.  Previously noted fracture has healed in anatomic position. Ankle mortise is normal.  Impression healed ankle fracture with normal alignment

## 2010-12-25 NOTE — Progress Notes (Signed)
Followup visit.  Problem #1 LEFT shoulder pain history of rotator cuff tear reinjured the shoulder secondary to a fall went to physical therapy.  Problem #2 fracture, RIGHT ankle, secondary to the same fall.  Patient complains of improvement in both areas with full range of motion in the shoulder and no pain or swelling in his RIGHT ankle.  Repeat ankle x-ray shows fracture healing.  Patient has been discharged

## 2010-12-26 ENCOUNTER — Ambulatory Visit (HOSPITAL_COMMUNITY): Payer: Medicare Other | Admitting: Specialist

## 2010-12-26 NOTE — Telephone Encounter (Signed)
Place in prescription box

## 2010-12-26 NOTE — Telephone Encounter (Signed)
I faxed order to [facility]

## 2010-12-30 ENCOUNTER — Inpatient Hospital Stay (HOSPITAL_COMMUNITY)
Admission: AD | Admit: 2010-12-30 | Discharge: 2011-01-01 | DRG: 249 | Disposition: A | Discharge status: Home / Self Care | Payer: Medicare Other | Source: Other Acute Inpatient Hospital | Attending: Cardiology | Admitting: Cardiology

## 2010-12-30 ENCOUNTER — Emergency Department (HOSPITAL_COMMUNITY): Payer: Medicare Other

## 2010-12-30 ENCOUNTER — Emergency Department (HOSPITAL_COMMUNITY)
Admission: EM | Admit: 2010-12-30 | Discharge: 2010-12-30 | Disposition: A | Discharge status: Other Acute Inpatient Hospital | Payer: Medicare Other | Source: Home / Self Care | Attending: Emergency Medicine | Admitting: Emergency Medicine

## 2010-12-30 DIAGNOSIS — IMO0002 Reserved for concepts with insufficient information to code with codable children: Secondary | ICD-10-CM

## 2010-12-30 DIAGNOSIS — M109 Gout, unspecified: Secondary | ICD-10-CM | POA: Diagnosis present

## 2010-12-30 DIAGNOSIS — N400 Enlarged prostate without lower urinary tract symptoms: Secondary | ICD-10-CM | POA: Diagnosis present

## 2010-12-30 DIAGNOSIS — I10 Essential (primary) hypertension: Secondary | ICD-10-CM | POA: Diagnosis present

## 2010-12-30 DIAGNOSIS — E785 Hyperlipidemia, unspecified: Secondary | ICD-10-CM | POA: Diagnosis present

## 2010-12-30 DIAGNOSIS — I2510 Atherosclerotic heart disease of native coronary artery without angina pectoris: Principal | ICD-10-CM | POA: Diagnosis present

## 2010-12-30 DIAGNOSIS — I25810 Atherosclerosis of coronary artery bypass graft(s) without angina pectoris: Secondary | ICD-10-CM | POA: Diagnosis present

## 2010-12-30 DIAGNOSIS — N289 Disorder of kidney and ureter, unspecified: Secondary | ICD-10-CM | POA: Diagnosis present

## 2010-12-30 DIAGNOSIS — Z79899 Other long term (current) drug therapy: Secondary | ICD-10-CM

## 2010-12-30 DIAGNOSIS — M1990 Unspecified osteoarthritis, unspecified site: Secondary | ICD-10-CM | POA: Diagnosis present

## 2010-12-30 DIAGNOSIS — Z87891 Personal history of nicotine dependence: Secondary | ICD-10-CM

## 2010-12-30 DIAGNOSIS — R7309 Other abnormal glucose: Secondary | ICD-10-CM | POA: Diagnosis present

## 2010-12-30 DIAGNOSIS — R079 Chest pain, unspecified: Secondary | ICD-10-CM

## 2010-12-30 DIAGNOSIS — I200 Unstable angina: Secondary | ICD-10-CM | POA: Diagnosis present

## 2010-12-30 DIAGNOSIS — I2582 Chronic total occlusion of coronary artery: Secondary | ICD-10-CM | POA: Diagnosis present

## 2010-12-30 LAB — BASIC METABOLIC PANEL
BUN: 33 mg/dL — ABNORMAL HIGH (ref 6–23)
CO2: 25 mEq/L (ref 19–32)
Chloride: 99 mEq/L (ref 96–112)
Creatinine, Ser: 1.49 mg/dL (ref 0.4–1.5)
GFR calc Af Amer: 57 mL/min — ABNORMAL LOW (ref >60)
Potassium: 4.3 mEq/L (ref 3.5–5.1)

## 2010-12-30 LAB — DIFFERENTIAL
Eosinophils Absolute: 0 10*3/uL (ref 0.0–0.7)
Lymphs Abs: 0.9 10*3/uL (ref 0.7–4.0)
Monocytes Relative: 5 % (ref 3–12)
Neutro Abs: 10.7 10*3/uL — ABNORMAL HIGH (ref 1.7–7.7)
Neutrophils Relative %: 87 % — ABNORMAL HIGH (ref 43–77)

## 2010-12-30 LAB — CBC
Hemoglobin: 13.3 g/dL (ref 13.0–17.0)
MCH: 30.6 pg (ref 26.0–34.0)
MCV: 91 fL (ref 78.0–100.0)
Platelets: 177 10*3/uL (ref 150–400)
RBC: 4.35 MIL/uL (ref 4.22–5.81)
WBC: 12.3 10*3/uL — ABNORMAL HIGH (ref 4.0–10.5)

## 2010-12-30 LAB — PRO B NATRIURETIC PEPTIDE: Pro B Natriuretic peptide (BNP): 880 pg/mL — ABNORMAL HIGH (ref 0–125)

## 2010-12-30 LAB — CK TOTAL AND CKMB (NOT AT ARMC)
Relative Index: INVALID (ref 0.0–2.5)
Total CK: 63 U/L (ref 7–232)

## 2010-12-30 LAB — TROPONIN I: Troponin I: <0.3 ng/mL (ref <0.30)

## 2010-12-31 ENCOUNTER — Encounter: Payer: Self-pay | Admitting: *Deleted

## 2010-12-31 DIAGNOSIS — I2510 Atherosclerotic heart disease of native coronary artery without angina pectoris: Secondary | ICD-10-CM

## 2010-12-31 DIAGNOSIS — I517 Cardiomegaly: Secondary | ICD-10-CM

## 2010-12-31 LAB — CBC
MCH: 30.7 pg (ref 26.0–34.0)
MCHC: 34.6 g/dL (ref 30.0–36.0)
Platelets: 142 10*3/uL — ABNORMAL LOW (ref 150–400)

## 2010-12-31 LAB — BASIC METABOLIC PANEL
Calcium: 8.5 mg/dL (ref 8.4–10.5)
Creatinine, Ser: 1.15 mg/dL (ref 0.4–1.5)
GFR calc Af Amer: >60 mL/min (ref >60)
GFR calc non Af Amer: >60 mL/min (ref >60)
Sodium: 136 mEq/L (ref 135–145)

## 2010-12-31 LAB — TSH: TSH: 0.211 u[IU]/mL — ABNORMAL LOW (ref 0.350–4.500)

## 2010-12-31 LAB — POCT ACTIVATED CLOTTING TIME: Activated Clotting Time: 381 seconds

## 2010-12-31 LAB — PROTIME-INR: INR: 1.04 (ref 0.00–1.49)

## 2010-12-31 LAB — HEPARIN LEVEL (UNFRACTIONATED)
Heparin Unfractionated: 0.63 IU/mL (ref 0.30–0.70)
Heparin Unfractionated: 0.69 IU/mL (ref 0.30–0.70)

## 2010-12-31 LAB — LIPID PANEL
Cholesterol: 190 mg/dL (ref 0–200)
LDL Cholesterol: 98 mg/dL (ref 0–99)
Triglycerides: 163 mg/dL — ABNORMAL HIGH (ref <150)

## 2011-01-01 DIAGNOSIS — R079 Chest pain, unspecified: Secondary | ICD-10-CM

## 2011-01-01 LAB — CBC
HCT: 37.8 % — ABNORMAL LOW (ref 39.0–52.0)
Hemoglobin: 12.6 g/dL — ABNORMAL LOW (ref 13.0–17.0)
MCH: 30.1 pg (ref 26.0–34.0)
MCV: 90.2 fL (ref 78.0–100.0)
Platelets: 131 10*3/uL — ABNORMAL LOW (ref 150–400)
RBC: 4.19 MIL/uL — ABNORMAL LOW (ref 4.22–5.81)
WBC: 8.4 10*3/uL (ref 4.0–10.5)

## 2011-01-01 LAB — BASIC METABOLIC PANEL
BUN: 25 mg/dL — ABNORMAL HIGH (ref 6–23)
CO2: 30 mEq/L (ref 19–32)
Chloride: 106 mEq/L (ref 96–112)
Glucose, Bld: 112 mg/dL — ABNORMAL HIGH (ref 70–99)
Potassium: 4.1 mEq/L (ref 3.5–5.1)
Sodium: 142 mEq/L (ref 135–145)

## 2011-01-09 ENCOUNTER — Ambulatory Visit (INDEPENDENT_AMBULATORY_CARE_PROVIDER_SITE_OTHER): Payer: Medicare Other | Admitting: Adult Health

## 2011-01-09 ENCOUNTER — Encounter: Payer: Self-pay | Admitting: Adult Health

## 2011-01-09 DIAGNOSIS — I10 Essential (primary) hypertension: Secondary | ICD-10-CM

## 2011-01-09 DIAGNOSIS — E785 Hyperlipidemia, unspecified: Secondary | ICD-10-CM

## 2011-01-09 DIAGNOSIS — I2510 Atherosclerotic heart disease of native coronary artery without angina pectoris: Secondary | ICD-10-CM

## 2011-01-09 DIAGNOSIS — E7800 Pure hypercholesterolemia, unspecified: Secondary | ICD-10-CM

## 2011-01-09 MED ORDER — NITROGLYCERIN 0.4 MG SL SUBL: 0.4000 mg | SUBLINGUAL_TABLET | SUBLINGUAL | Status: DC | PRN

## 2011-01-09 NOTE — Discharge Summary (Addendum)
[name], [name]             ACCOUNT NO.:  [id number]  MEDICAL RECORD NO.:  [id number]  LOCATION:  6529                         FACILITY:  [facility]  PHYSICIAN:  Veverly Fells. Excell Seltzer, MD  DATE OF BIRTH:  05-04-1941  DATE OF ADMISSION:  12/30/2010 DATE OF DISCHARGE:  01/01/2011                              DISCHARGE SUMMARY   DISCHARGE DIAGNOSES: 1. Chest pain, felt to be an angina equivalent.     a.     One set of cardiac enzymes showing elevated MB of 6.2 with a      negative troponin. 2. Coronary artery disease.     a.     Status post coronary artery bypass graft in 2000.     b.     Cardiac catheterization, December 31, 2010, status post      percutaneous coronary intervention with bare-metal stent to the      native left circumflex, for aspirin and Plavix for minimum of 30      days actually transitioned 3. Hypertension. 4. Hyperlipidemia, initiated on statin this admission. 5. Osteoarthritis. 6. Gastroesophageal reflux disease. 7. Gout. 8. Benign prostatic hypertrophy. 9. History of small bowel obstruction. 10.History of Staphylococcal sternal wound infection. 11.Chronic prednisone therapy. 12.Suppressed TSH of 0.211, for outpatient follow up with PCP. 13.Hyperglycemia, possibly related to prednisone use, for outpatient     follow up with PCP. 14.Past surgical history:  Multiple abdominal surgeries,     cholecystectomy, multiple mesh, hernia repairs and coronary artery     bypass graft.  HOSPITAL COURSE:  [name] is a 70 year old gentleman with a medical history that includes CAD status post CABG, hypertension, hyperlipidemia who presented to [facility] with 3 weeks of increasing dyspnea on exertion and chest tightness.  He initially went to [facility] ER where he was found to have no acute EKG changes and first set of enzymes that are negative, and was started on IV heparin and nitroglycerin for concern for angina.  He was subsequently transferred to [facility] for further evaluation.  He was hydrated gently in anticipation for heart catheterization which was performed on December 31, 2010, by Dr. Excell Seltzer.  The patient had patent LIMA to the LAD and patent SVG to the first OM with diffuse degenerative disease without significant high- grade stenosis.  The SVG to intermediate branch had diffuse degenerative disease with no high-grade stenosis, but the entire graft had diffuse 30- 40% irregularity.  The intermediate branch was patent.  The patient had native left circumflex stenosis which was subsequently stented with the Vision bare-metal stent.  The patient was initiated on dual antiplatelet therapy with aspirin and Plavix for a minimum of 30 days, with recommendation for long-term aspirin of 81 mg daily.  The patient tolerated the procedure well and post procedurally was doing well on day of discharge.  Dr. Excell Seltzer has seen and examined him today and feels he is stable for discharge.  DISCHARGE LABORATORY DATA:  WBC 8.4, hemoglobin 12.6, hematocrit 37.8, and platelet count 131.  Sodium 142, potassium 4.1, chloride 106, CO2 of 30, glucose 112, BUN 25, and creatinine 1.21.  BNP 880, CK 63, MB 6.2, troponin less than 0.30.  Total cholesterol 190, triglycerides 163, HDL 69, and LDL 98.  TSH 0.211.  STUDIES: 1. Chest x-ray, December 30, 2010, showed postsurgical changes suspect     CABG, chronic changes versus scarring in right base with     questionable with focus or infiltrate in the right upper lobe. 2. A 2-D echocardiogram on December 31, 2010, showed probable inferior wall     hypokinesis.  LV wall thickness was increased in the pattern of     mild LVH.  EF 50-55%. 3. Cardiac catheterization, December 31, 2010, please see full report for     details as well as HPI for summary.  DISCHARGE MEDICATIONS: 1. Aspirin 81 mg daily. 2. Plavix 75 mg daily. 3. Metoprolol tartrate 25 mg b.i.d. 4. Nitroglycerin sublingual 0.4 mg every 5 minutes as needed up  to 3     doses for chest pain. 5. Pravastatin 20 mg at bedtime. 6. Citalopram 40 mg half tablet daily. 7. Doxazosin 4 mg 2 tablets at bedtime. 8. Levalbuterol nebulizers 1.25 mg inhaled q.6 h p.r.n. shortness of     breath. 9. Prednisone 20 mg 2 tablets daily.  DISPOSITION:  [name] will be discharged in stable condition to home.  He is to increase activity slowly and to call or return if he notices any pain, swelling, bleeding, or pus at the cath site.  He is to follow a heart-healthy diet.  He is also instructed to follow up with his PCP in regards to his hyperglycemia while taking prednisone, along with a suppressed TSH of 0.211 which may also be related to his prednisone but does need a recheck.  I stressed this to him personally. He will follow up with Dr. Marvel Plan office to see Joni Reining, NP, for his first post hospital visit on January 09, 2011.  DURATION OF DISCHARGE ENCOUNTER:  Greater than 30 minutes including physician and PA time.     Ronie Spies, P.A.C.   ______________________________ Veverly Fells. Excell Seltzer, MD    DD/MEDQ  D:  01/01/2011  T:  01/02/2011  Job:  [id number]  cc:   Gerrit Friends. Dietrich Pates, MD, [facility] Oneal Deputy. Juanetta Gosling, M.D.  Electronically Signed by Ronie Spies  on 01/09/2011 09:26:09 AM Electronically Signed by Tonny Bollman MD on 02/13/2011 12:10:51 AM

## 2011-01-09 NOTE — Assessment & Plan Note (Signed)
He will continue current medication regimen for this will follow-up labs in 3 months.

## 2011-01-09 NOTE — Patient Instructions (Signed)
Your physician recommends that you schedule a follow-up appointment in: 6 months  

## 2011-01-09 NOTE — Assessment & Plan Note (Signed)
Currently well controlled. No changes at this time. 

## 2011-01-09 NOTE — Assessment & Plan Note (Signed)
He is s/p PCI with BMS to the native Cx.  He is doing fair s/p procedure but has not had increased energy or stamina.  He continues to struggle with DOE.  I have encouraged him to take advantage of  cardiac rehab for increased energy. He has multiple medical problems and I doubt that he will feel completely healthy and strong, although I hope that he will slowly improve.  Will take him off of Plavix in one month. Will have him follow-up with Dr. Dietrich Pates in one month.

## 2011-01-09 NOTE — Progress Notes (Signed)
HPI: [name] is a 70 y/o CM patient of Dr. Dietrich Pates we are seeing on follow-up from recent hospitalization for chest pain with known history of CAD, CABG, with cardiac catheterization during recent admission and PCI of native circumflex with BMS.  He was started on ASA and Plavix.  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 There are multiple other medical issues as well.   Since discharge he has not had any further chest pain. He continues chronically short of breath and easily fatigued.  He is to start cardiac rehab sometime within the next few weeks.  He tries to walk about 10-60min twice a day, but is having some trouble going that far.  He is medically compliant and has a good attitude about his progress.    Allergies  Allergen Reactions  . Metoprolol     REACTION: bradycardia  . Morphine     REACTION: made him go crazy    Current Outpatient Prescriptions  Medication Sig Dispense Refill  . aspirin 81 MG tablet Take 81 mg by mouth daily.        . citalopram (CELEXA) 20 MG tablet Take 20 mg by mouth daily.        . clopidogrel (PLAVIX) 75 MG tablet Take 75 mg by mouth daily.        Marland Kitchen doxazosin (CARDURA) 8 MG tablet Take 8 mg by mouth at bedtime.        . levalbuterol (XOPENEX) 1.25 MG/0.5ML nebulizer solution Take 1 ampule by nebulization every 4 (four) hours as needed.        . metoprolol tartrate (LOPRESSOR) 25 MG tablet Take 25 mg by mouth 2 (two) times daily.        . nitroGLYCERIN (NITROSTAT) 0.4 MG SL tablet Place 1 tablet (0.4 mg total) under the tongue every 5 (five) minutes as needed.  25 tablet  3  . pravastatin (PRAVACHOL) 20 MG tablet Take 20 mg by mouth daily.        . predniSONE (DELTASONE) 20 MG tablet Take 20 mg by mouth daily.        Marland Kitchen DISCONTD: nitroGLYCERIN (NITROSTAT) 0.4 MG SL tablet Place 0.4 mg under the tongue every 5  (five) minutes as needed.        Marland Kitchen DISCONTD: lisinopril (PRINIVIL,ZESTRIL) 2.5 MG tablet Take 2.5 mg by mouth daily.          Past Medical History  Diagnosis Date  . HTN (hypertension)     Past Surgical History  Procedure Date  . Right shoulder dr Romeo Apple     ROS: Review of systems complete and found to be negative unless listed above PHYSICAL EXAM BP 129/72  Pulse 90  Wt 216 lb (97.977 kg) General: Well developed, well nourished, in no acute distress Head: Eyes PERRLA, No xanthomas.   Normal cephalic and atramatic  Lungs: Clear bilaterally to auscultation and percussion with prolonged expiratory phase Heart: HRRR S1 S2,  Pulses are 2+ & equal. Distant heart sounds.            No carotid bruit. No JVD.  No abdominal bruits. No femoral bruits. Abdomen: Bowel sounds are positive, abdomen soft and non-tender without masses or                  Hernia's noted. He is wearing an abdominal elastic brace. Msk:  Back normal, normal gait. Normal  strength and tone for age.  Extremities: No clubbing, cyanosis or edema.  DP +1 Neuro: Alert and oriented X 3. Psych:  Good affect, responds appropriately  EKG: NSR rate of 83 bpm  ASSESSMENT AND PLAN

## 2011-01-13 ENCOUNTER — Encounter: Payer: Self-pay | Admitting: Adult Health

## 2011-01-15 NOTE — H&P (Signed)
[name], [name]             ACCOUNT NO.:  [id number]  MEDICAL RECORD NO.:  [id number]  LOCATION:  2006                         FACILITY:  [facility]  PHYSICIAN:  Rollene Rotunda, MD, FACCDATE OF BIRTH:  1941/06/30  DATE OF ADMISSION:  12/30/2010 DATE OF DISCHARGE:                             HISTORY & PHYSICAL   PRIMARY DOCTOR:  Oneal Deputy. Juanetta Gosling, MD  CARDIOLOGIST:  Gerrit Friends. Dietrich Pates, MD, [facility]  REASON FOR PRESENTATION:  Patient with chest pain.  HISTORY OF PRESENT ILLNESS:  The patient is a pleasant 70 year old gentleman with a history of coronary disease status post bypass grafting.  His last catheterization as described below in 2006.  He did have a stress perfusion study in June of last year demonstrating no ischemia.  He now presents with 3 weeks of increasing dyspnea on exertion.  He said this coincided with an attempt to discontinue prednisone.  He has been on this chronically for years with recurrent staph infections.  However, he tried to discontinue it because it makes him feel bad.  However, afterward he started noticing increasing dyspnea.  This has progressed to be mild shortness of breath with activities such as doing a little yard work today.  Today, he was very dyspneic and had some chest tightness with this.  He stopped that he was doing.  His symptoms persisted and so he presented to [facility] emergency room where he was not found to have any acute EKG changes and his first cardiac enzymes were negative.  He was made pain free with IV nitroglycerin and heparin.  He is now transferred for further evaluation.  He denies any PND or orthopnea.  He has had some rapid heart rates, but he has had no presyncope or syncope.  He gains weight on prednisone.  Has had no new edema.  PAST MEDICAL HISTORY:  Coronary artery disease status post CABG (catheterization 2006 with patent LIMA to the LAD, patent SVG to the first obtuse marginal, patent saphenous vein graft to  the diagonal.  He had a native three-vessel disease including apical LAD stenosis that was managed medically), hypertension, hyperlipidemia, staph sternal wound infection, osteoarthritis, gastroesophageal reflux disease, gout, benign prostatic hypertrophy, small bowel obstructions.  PAST SURGICAL HISTORY:  Multiple abdominal surgeries, cholecystectomy, multiple mesh hernia repairs, CABG in 2000.  ALLERGIES:  MORPHINE.  METOPROLOL did cause bradycardia.  MEDICATIONS ON ADMISSION: 1. Cardura 8 mg daily. 2. Prednisone 40 mg daily.  SOCIAL HISTORY:  Patient is retired.  He is married.  He lives in [place].  He was a smoker 20 pack years, but quit in 2000.  FAMILY HISTORY:  Contributory for his mother having coronary disease in her 62s, but she died of cancer in her 64s.  REVIEW OF SYSTEMS:  As stated in the HPI, otherwise positive for recent constipation with abdominal discomfort and small bowel obstruction. Otherwise negative for all other systems.  PHYSICAL EXAMINATION:  GENERAL:  Patient is pleasant and in no distress. VITAL SIGNS:  Blood pressure 166/97, heart rate 107, respiratory rate 20, afebrile, 95% saturation on room air. HEENT:  Eyelids are unremarkable.  Pupils equal, round, and reactive to light.  Fundi not visualized.  Oral mucosa unremarkable. NECK:  No jugular venous distention at 45 degrees, carotid upstrokes brisk and symmetrical.  No bruits.  No thyromegaly. LYMPHATICS:  No cervical, axillary, inguinal adenopathy. LUNGS:  Clear to auscultation bilaterally. BACK:  No costovertebral angle tenderness. CHEST:  Well-healed sternotomy scar. HEART:  PMI not displaced or sustained, S1 and S2 within normal limits. No S3, no S4, no clicks, no rubs, no murmurs. ABDOMEN:  Flat, positive bowel sounds normal in frequency and pitch.  No bruits, no rebound, no guarding, no midline pulsatile mass, no hepatomegaly, splenomegaly. SKIN:  No rashes, no nodules. EXTREMITIES:  2+  pulses throughout, no edema, no cyanosis, or no clubbing. NEUROLOGIC:  Oriented to person, place, time.  Cranial nerves II-XII grossly intact, motor grossly intact.  LABORATORY DATA:  WBC 12.3, hemoglobin 13.3, platelets 177.  Pro BNP 880, sodium 136, potassium 4.3, BUN 33, creatinine 1.49.  Chest x-ray question vague right upper lobe infiltrate.  ASSESSMENT AND PLAN: 1. Chest discomfort/dyspnea.  This does appear to be an anginal     equivalent.  At this point, he will be managed on heparin, aspirin,     and IV nitroglycerin.  He will need elective cardiac     catheterization.  I will cycle enzymes and repeat an EKG.  Of note,     he has had some hesitancy to take any kind of blood thinners     including aspirin because of the fragility of his skin, easy     bruising, and bleeding.  This will need to be considered if he does     need intervention.  For tonight, I will put him on an aspirin.  He     is also had some bradycardia on beta blockers, but he is     tachycardic now and so I will start low-dose beta blockers. 2. Mild renal insufficiency, I will hydrate him gently understanding     that his pro BNP was slightly elevated.  I will check a creatinine     in the morning. 3. Chronic prednisone therapy.  He will continue his 40 mg of     prednisone. 4. Risk reduction.  I am going to empirically start him on Lipitor,     they will need to discuss this with him.  He did not want to take     medications in the past, was on pravastatin, but apparently     discontinue this.     Rollene Rotunda, MD, [facility]     JH/MEDQ  D:  12/30/2010  T:  12/31/2010  Job:  [id number]  cc:   Ramon Dredge L. Juanetta Gosling, M.D.  Electronically Signed by Rollene Rotunda MD [facility] on 01/15/2011 11:45:25 AM

## 2011-01-22 ENCOUNTER — Other Ambulatory Visit (HOSPITAL_COMMUNITY): Payer: Medicare Other

## 2011-01-30 ENCOUNTER — Ambulatory Visit (HOSPITAL_COMMUNITY)
Admission: RE | Admit: 2011-01-30 | Discharge: 2011-01-30 | Disposition: A | Discharge status: Home / Self Care | Payer: Medicare Other | Source: Ambulatory Visit | Attending: Ophthalmology | Admitting: Ophthalmology

## 2011-01-30 DIAGNOSIS — Z79899 Other long term (current) drug therapy: Secondary | ICD-10-CM | POA: Insufficient documentation

## 2011-01-30 DIAGNOSIS — IMO0002 Reserved for concepts with insufficient information to code with codable children: Secondary | ICD-10-CM | POA: Insufficient documentation

## 2011-01-30 DIAGNOSIS — H2589 Other age-related cataract: Secondary | ICD-10-CM | POA: Insufficient documentation

## 2011-01-30 DIAGNOSIS — Z951 Presence of aortocoronary bypass graft: Secondary | ICD-10-CM | POA: Insufficient documentation

## 2011-01-30 DIAGNOSIS — Z7982 Long term (current) use of aspirin: Secondary | ICD-10-CM | POA: Insufficient documentation

## 2011-01-30 DIAGNOSIS — I10 Essential (primary) hypertension: Secondary | ICD-10-CM | POA: Insufficient documentation

## 2011-02-04 ENCOUNTER — Encounter (HOSPITAL_COMMUNITY): Payer: Self-pay

## 2011-02-04 ENCOUNTER — Encounter (HOSPITAL_COMMUNITY)
Admission: RE | Admit: 2011-02-04 | Discharge: 2011-02-04 | Disposition: A | Discharge status: Home / Self Care | Payer: Medicare Other | Source: Ambulatory Visit | Attending: Cardiology | Admitting: Cardiology

## 2011-02-04 HISTORY — DX: Atherosclerotic heart disease of native coronary artery without angina pectoris: I25.10

## 2011-02-04 HISTORY — DX: Hyperlipidemia, unspecified: E78.5

## 2011-02-04 NOTE — Progress Notes (Signed)
Orientation Completed on 02/04/11. Pt is scheduled to start on Monday 02/10/11 at 8:15am. Pt is registered and records requested. Pt has been in Cardiac Rehab before in 2000.  Pt is really eager to get started in our program.

## 2011-02-04 NOTE — Patient Instructions (Signed)
During orientation advised patient on arrival and appointment times what to wear, what to do before, during and after exercise. Reviewed attendance and class policy. Talked about inclement weather and class consultation policy.   

## 2011-02-10 ENCOUNTER — Encounter (HOSPITAL_COMMUNITY): Payer: Medicare Other

## 2011-02-12 ENCOUNTER — Encounter (HOSPITAL_COMMUNITY): Payer: Medicare Other

## 2011-02-13 NOTE — Cardiovascular Report (Signed)
NAMEHENNING, EHLE             ACCOUNT NO.:  [id number]  MEDICAL RECORD NO.:  [id number]  LOCATION:  6529                         FACILITY:  [facility]  PHYSICIAN:  Veverly Fells. Excell Seltzer, MD  DATE OF BIRTH:  [birth year]  DATE OF PROCEDURE:  12/31/2010 DATE OF DISCHARGE:                           CARDIAC CATHETERIZATION   PROCEDURES: 1. Left heart catheterization. 2. Selective coronary angiography. 3. Saphenous vein graft angiography. 4. LIMA and RIMA angiography. 5. PTCA and stenting of the left circumflex using a bare-metal stent     platform. 6. Perclose of the right femoral artery.  PROCEDURAL INDICATION:  Mr. Chalker is a 70 year old gentleman who presented with unstable angina.  He has known coronary artery disease and has undergone coronary bypass surgery in approximately 2000.  He was referred for cardiac cath.  The patient has mild renal insufficiency. His creatinine was 1.5 on admission and is down to 1.15 after IV fluidhydration.  Risks and indications of the procedure were reviewed with the patient. Informed consent was obtained.  The right groin was prepped, draped, and anesthetized with 1% lidocaine using modified Seldinger technique.  A 5- French sheath was placed in the right femoral artery.  Standard Judkins catheters were used for coronary angiography.  A JL-5 catheter had to be used for imaging of the left circumflex.  A LIMA catheter was used for LIMA and RIMA angiography.  The JR-4 catheter was used for saphenous vein graft angiography.  All catheter exchanges were performed over a guidewire.  The patient tolerated the diagnostic procedure well. Following the diagnostic procedure, I elected to perform PCI of the native left circumflex.  DIAGNOSTIC FINDINGS:  Aortic pressure 203/94 with a mean of 137, left ventricular pressure is 200/13. Left mainstem:  The left main is short, it has 30-40% stenosis.  It divides into the LAD and left circumflex. LAD:  The LAD  has critical ostial stenosis of 95%.  The LAD goes on to totally occlude at the level of the first septal perforator.  The mid and distal LAD fill entirely from the left internal mammary graft.  The diagonals are only visualized from bypass graft angiography. The ramus intermedius is severely diseased.  There is diffuse 90% proximal stenosis.  The left circumflex is patent.  The mid circumflex has an 80-90% stenosis.  The obtuse marginals are severely diseased and fill from graft flow.  There are 2 posterolateral branches that fill from the native circumflex and they are ungrafted. Right coronary artery:  The ostium is heavily calcified.  The ostial right coronary artery is totally occluded.  The distal RCA fills entirely from the right internal mammary artery. LIMA angiography.  I made a prolonged attempt to selectively engage the RIMA.  I used a JR-4 and a LIMA catheter.  I tried to use a wire to keep my position, but I was never able to fully engage the RIMA.  The vessel was imaged with a blood pressure cuff, inflated on the right arm with nonselective angiography.  The RIMA is widely patent with good antegrade flow.  The distal right coronary artery is clearly patent, but is not well visualized.  SAPHENOUS VEIN GRAFT ANGIOGRAPHY: 1. The saphenous vein graft  to the intermediate branch has diffuse     degenerated disease.  There is no high-grade stenosis but the     entire graft has diffuse 30-40% irregularity.  The intermediate     branch is patent. 2. Saphenous vein graft to first OM is also patent with diffuse     degenerated disease without significant high-grade stenosis. 3. LIMA to LAD is widely patent without significant stenosis.  PCI NOTE:  The native left circumflex supplies posterolateral branches that are ungrafted.  There is high-grade focal stenosis.  I decided to intervene on this vessel as I suspected it was the culprit for his acute coronary syndrome.  The 5-French  sheath was upsized to a 6-French.  An XB 4-cm guide catheter was utilized.  The patient was given 600 mg of Plavix on the table.  Angiomax was used for anticoagulation.  Once a therapeutic ACT was achieved, a Cougar guidewire was advanced into the distal circumflex and the lesion was predilated with a 2.5 x 12 mm Apex balloon.  The lesion was then stented with 3.0 x 12 mm Vision bare-metal stent.  The stent was deployed at 14 atmospheres and appeared well expanded.  A bare-metal stent platform was chosen because the lesion is focal, the patient is nondiabetic, and he is on chronic prednisone and has concerns over bleeding risk.  The stent was postdilated with a 3.25 x 8 mm [place] Quantum Apex balloon which was dilated to 14 atmospheres on 2 inflations.  There was an excellent angiographic result with 0% residual stenosis and TIMI 3 flow at the completion of the procedure.  A Perclose device was used for femoral hemostasis.  FINAL CONCLUSIONS: 1. Severe native 3-vessel coronary artery disease. 2. Continued patency of the LIMA to LAD, RIMA to RCA, saphenous vein     graft to OM, and saphenous vein graft to ramus intermedius. 3. Successful PCI of the native left circumflex using a bare-metal     stent platform. 4. Severe systemic hypertension.  RECOMMENDATIONS:  The patient should be maintained on dual antiplatelet therapy with aspirin and Plavix for a minimum of 30 days.  I would recommend long-term aspirin 81 mg daily.     Veverly Fells. Excell Seltzer, MD     MDC/MEDQ  D:  12/31/2010  T:  01/01/2011  Job:  [id number]  cc:   Ramon Dredge L. Juanetta Gosling, M.D. Gerrit Friends. Dietrich Pates, MD, [facility]  Electronically Signed by Tonny Bollman MD on 02/13/2011 12:10:46 AM

## 2011-02-14 ENCOUNTER — Encounter (HOSPITAL_COMMUNITY): Payer: Medicare Other

## 2011-02-17 ENCOUNTER — Encounter (HOSPITAL_COMMUNITY): Payer: Medicare Other

## 2011-02-17 NOTE — Op Note (Signed)
  NAMEJARAMIE, BASTOS             ACCOUNT NO.:  [id number]  MEDICAL RECORD NO.:  [id number]  LOCATION:  DAYP                          FACILITY:  [facility]  PHYSICIAN:  Susanne Greenhouse, MD       DATE OF BIRTH:  01-13-1941  DATE OF PROCEDURE: DATE OF DISCHARGE:                              OPERATIVE REPORT   PREOPERATIVE DIAGNOSIS:  Combined cataract, right eye.  POSTOPERATIVE DIAGNOSIS:  Combined cataract, right eye.  DIAGNOSIS CODE:  366.19.  No surgical specimens.  PROSTHETIC DEVICE USED:  Lenstec posterior chamber lens, model Softec HD, power of 16.0, serial number is [id number].          ______________________________ Susanne Greenhouse, MD     KEH/MEDQ  D:  01/30/2011  T:  01/30/2011  Job:  [id number]  Electronically Signed by Gemma Payor MD on 02/17/2011 10:23:07 AM

## 2011-02-19 ENCOUNTER — Encounter (HOSPITAL_COMMUNITY): Payer: Medicare Other

## 2011-02-21 ENCOUNTER — Encounter (HOSPITAL_COMMUNITY): Payer: Medicare Other

## 2011-02-24 ENCOUNTER — Encounter (HOSPITAL_COMMUNITY): Payer: Medicare Other

## 2011-02-26 ENCOUNTER — Encounter (HOSPITAL_COMMUNITY): Payer: Medicare Other

## 2011-02-28 ENCOUNTER — Encounter (HOSPITAL_COMMUNITY): Payer: Medicare Other

## 2011-03-03 ENCOUNTER — Encounter (HOSPITAL_COMMUNITY): Payer: Medicare Other

## 2011-03-05 ENCOUNTER — Encounter (HOSPITAL_COMMUNITY): Payer: Medicare Other

## 2011-03-07 ENCOUNTER — Encounter (HOSPITAL_COMMUNITY): Payer: Medicare Other

## 2011-03-10 ENCOUNTER — Encounter (HOSPITAL_COMMUNITY): Payer: Medicare Other

## 2011-03-12 ENCOUNTER — Other Ambulatory Visit: Payer: Self-pay | Admitting: *Deleted

## 2011-03-12 ENCOUNTER — Encounter (HOSPITAL_COMMUNITY): Payer: Medicare Other

## 2011-03-12 MED ORDER — CLOPIDOGREL BISULFATE 75 MG PO TABS: 75.0000 mg | ORAL_TABLET | Freq: Every day | ORAL | Status: DC

## 2011-03-14 ENCOUNTER — Encounter (HOSPITAL_COMMUNITY): Payer: Medicare Other

## 2011-03-17 ENCOUNTER — Encounter (HOSPITAL_COMMUNITY): Payer: Medicare Other

## 2011-03-19 ENCOUNTER — Encounter (HOSPITAL_COMMUNITY): Payer: Medicare Other

## 2011-03-21 ENCOUNTER — Encounter (HOSPITAL_COMMUNITY): Payer: Medicare Other

## 2011-03-24 ENCOUNTER — Encounter (HOSPITAL_COMMUNITY): Payer: Medicare Other

## 2011-03-26 ENCOUNTER — Encounter (HOSPITAL_COMMUNITY): Payer: Medicare Other

## 2011-03-28 ENCOUNTER — Encounter (HOSPITAL_COMMUNITY): Payer: Medicare Other

## 2011-03-31 ENCOUNTER — Encounter (HOSPITAL_COMMUNITY): Payer: Medicare Other

## 2011-04-02 ENCOUNTER — Encounter (HOSPITAL_COMMUNITY): Payer: Medicare Other

## 2011-04-04 ENCOUNTER — Encounter (HOSPITAL_COMMUNITY): Payer: Medicare Other

## 2011-04-07 ENCOUNTER — Encounter (HOSPITAL_COMMUNITY): Payer: Medicare Other

## 2011-04-09 ENCOUNTER — Encounter (HOSPITAL_COMMUNITY): Payer: Medicare Other

## 2011-04-11 ENCOUNTER — Encounter (HOSPITAL_COMMUNITY): Payer: Medicare Other

## 2011-04-14 ENCOUNTER — Encounter (HOSPITAL_COMMUNITY): Payer: Medicare Other

## 2011-04-16 ENCOUNTER — Encounter (HOSPITAL_COMMUNITY): Payer: Medicare Other

## 2011-04-18 ENCOUNTER — Encounter (HOSPITAL_COMMUNITY): Payer: Medicare Other

## 2011-04-21 ENCOUNTER — Encounter (HOSPITAL_COMMUNITY): Payer: Medicare Other

## 2011-04-23 ENCOUNTER — Encounter (HOSPITAL_COMMUNITY): Payer: Medicare Other

## 2011-04-25 ENCOUNTER — Encounter (HOSPITAL_COMMUNITY): Payer: Medicare Other

## 2011-04-25 LAB — CBC
HCT: 38.1 — ABNORMAL LOW
HCT: 37.7 — ABNORMAL LOW
HCT: 38.3 — ABNORMAL LOW
Hemoglobin: 13.3
MCHC: 34.4
MCHC: 35.2
MCV: 91.3
MCV: 91.2
Platelets: 188
Platelets: 193
Platelets: 194
RDW: 14
RDW: 14.1
WBC: 6.5

## 2011-04-25 LAB — DIFFERENTIAL
Basophils Relative: 1
Eosinophils Absolute: 0.6
Eosinophils Relative: 10 — ABNORMAL HIGH
Lymphs Abs: 1.7
Neutrophils Relative %: 54

## 2011-04-25 LAB — TROPONIN I: Troponin I: 0.01

## 2011-04-25 LAB — POCT CARDIAC MARKERS
Myoglobin, poc: 75
Operator id: 267321

## 2011-04-25 LAB — CARDIAC PANEL(CRET KIN+CKTOT+MB+TROPI)
CK, MB: 1.2
Relative Index: INVALID
Total CK: 48
Troponin I: 0.01

## 2011-04-25 LAB — BASIC METABOLIC PANEL
BUN: 17
BUN: 12
BUN: 20
CO2: 26
CO2: 28
Chloride: 107
Creatinine, Ser: 1.38
GFR calc Af Amer: >60
GFR calc non Af Amer: 55 — ABNORMAL LOW
Glucose, Bld: 132 — ABNORMAL HIGH
Glucose, Bld: 99
Potassium: 4.4
Potassium: 4.1
Potassium: 4.3
Sodium: 139
Sodium: 140

## 2011-04-25 LAB — CK TOTAL AND CKMB (NOT AT ARMC)
CK, MB: 1.5
Relative Index: INVALID
Total CK: 60
Total CK: 78

## 2011-04-25 LAB — LIPID PANEL
Cholesterol: 181
LDL Cholesterol: 132 — ABNORMAL HIGH

## 2011-04-28 ENCOUNTER — Encounter (HOSPITAL_COMMUNITY): Payer: Medicare Other

## 2011-04-28 LAB — COMPREHENSIVE METABOLIC PANEL
AST: 19
Albumin: 3.9
BUN: 17
Chloride: 103
Creatinine, Ser: 1.29
GFR calc Af Amer: >60
Potassium: 4.1
Total Bilirubin: 1.3 — ABNORMAL HIGH
Total Protein: 6.7

## 2011-04-28 LAB — CBC
HCT: 37.1 — ABNORMAL LOW
Hemoglobin: 14.6
MCHC: 33.6
MCHC: 33.9
MCV: 93.7
MCV: 93.8
Platelets: 155
RBC: 3.69 — ABNORMAL LOW
RDW: 13.8
RDW: 14.1
RDW: 14.2
WBC: 9.4

## 2011-04-28 LAB — DIFFERENTIAL
Eosinophils Relative: 0
Lymphocytes Relative: 10 — ABNORMAL LOW
Monocytes Absolute: 0.8
Monocytes Relative: 9
Neutro Abs: 7.7

## 2011-04-28 LAB — GLUCOSE, CAPILLARY: Glucose-Capillary: 248 — ABNORMAL HIGH

## 2011-04-28 LAB — BASIC METABOLIC PANEL
BUN: 23
CO2: 27
CO2: 29
Chloride: 104
Chloride: 104
Creatinine, Ser: 1.57 — ABNORMAL HIGH
Creatinine, Ser: 1.61 — ABNORMAL HIGH
GFR calc Af Amer: 52 — ABNORMAL LOW
Glucose, Bld: 137 — ABNORMAL HIGH
Glucose, Bld: 147 — ABNORMAL HIGH
Potassium: 4.7

## 2011-04-30 ENCOUNTER — Encounter (HOSPITAL_COMMUNITY): Payer: Medicare Other

## 2011-05-01 LAB — CREATININE, SERUM: GFR calc Af Amer: >60 mL/min (ref >60)

## 2011-05-02 ENCOUNTER — Encounter (HOSPITAL_COMMUNITY): Payer: Medicare Other

## 2011-05-02 LAB — CBC
Hemoglobin: 13 g/dL (ref 13.0–17.0)
MCHC: 32.9 g/dL (ref 30.0–36.0)
MCV: 90.3 fL (ref 78.0–100.0)
RBC: 4.39 MIL/uL (ref 4.22–5.81)
RDW: 16.1 % — ABNORMAL HIGH (ref 11.5–15.5)

## 2011-05-02 LAB — DIFFERENTIAL
Basophils Relative: 0 % (ref 0–1)
Eosinophils Absolute: 0.1 10*3/uL (ref 0.0–0.7)
Monocytes Absolute: 0.6 10*3/uL (ref 0.1–1.0)
Monocytes Relative: 7 % (ref 3–12)
Neutrophils Relative %: 77 % (ref 43–77)

## 2011-05-05 ENCOUNTER — Encounter (HOSPITAL_COMMUNITY): Payer: Medicare Other

## 2011-05-07 ENCOUNTER — Encounter (HOSPITAL_COMMUNITY): Payer: Medicare Other

## 2011-05-09 ENCOUNTER — Encounter (HOSPITAL_COMMUNITY): Payer: Medicare Other

## 2011-05-12 ENCOUNTER — Encounter (HOSPITAL_COMMUNITY): Payer: Medicare Other

## 2011-05-14 ENCOUNTER — Encounter (HOSPITAL_COMMUNITY): Payer: Medicare Other

## 2011-05-16 ENCOUNTER — Encounter (HOSPITAL_COMMUNITY): Payer: Medicare Other

## 2011-05-19 ENCOUNTER — Ambulatory Visit (HOSPITAL_COMMUNITY): Payer: Medicare Other

## 2011-05-21 ENCOUNTER — Ambulatory Visit (HOSPITAL_COMMUNITY): Payer: Medicare Other

## 2011-05-23 ENCOUNTER — Ambulatory Visit (HOSPITAL_COMMUNITY): Payer: Medicare Other

## 2011-05-26 ENCOUNTER — Ambulatory Visit (HOSPITAL_COMMUNITY): Payer: Medicare Other

## 2011-05-28 ENCOUNTER — Ambulatory Visit (HOSPITAL_COMMUNITY): Payer: Medicare Other

## 2011-05-30 ENCOUNTER — Ambulatory Visit (HOSPITAL_COMMUNITY): Payer: Medicare Other

## 2011-06-02 ENCOUNTER — Ambulatory Visit (HOSPITAL_COMMUNITY): Payer: Medicare Other

## 2011-06-02 ENCOUNTER — Other Ambulatory Visit: Payer: Self-pay | Admitting: Neurosurgery

## 2011-06-02 DIAGNOSIS — M549 Dorsalgia, unspecified: Secondary | ICD-10-CM

## 2011-06-03 ENCOUNTER — Ambulatory Visit
Admission: RE | Admit: 2011-06-03 | Discharge: 2011-06-03 | Disposition: A | Discharge status: Home / Self Care | Payer: Medicare Other | Source: Ambulatory Visit | Attending: Neurosurgery | Admitting: Neurosurgery

## 2011-06-03 DIAGNOSIS — M549 Dorsalgia, unspecified: Secondary | ICD-10-CM

## 2011-06-04 ENCOUNTER — Other Ambulatory Visit: Payer: Self-pay | Admitting: Neurosurgery

## 2011-06-04 ENCOUNTER — Ambulatory Visit (HOSPITAL_COMMUNITY): Payer: Medicare Other

## 2011-06-06 ENCOUNTER — Encounter (HOSPITAL_COMMUNITY): Payer: Self-pay | Admitting: Pharmacy Technician

## 2011-06-06 ENCOUNTER — Ambulatory Visit (HOSPITAL_COMMUNITY): Payer: Medicare Other

## 2011-06-09 ENCOUNTER — Encounter (HOSPITAL_COMMUNITY)
Admission: RE | Admit: 2011-06-09 | Discharge: 2011-06-09 | Disposition: A | Discharge status: Home / Self Care | Payer: Medicare Other | Source: Ambulatory Visit | Attending: Neurosurgery | Admitting: Neurosurgery

## 2011-06-09 ENCOUNTER — Encounter (HOSPITAL_COMMUNITY): Payer: Self-pay

## 2011-06-09 HISTORY — DX: Disease of blood and blood-forming organs, unspecified: D75.9

## 2011-06-09 LAB — APTT: aPTT: 25 seconds (ref 24–37)

## 2011-06-09 LAB — CBC
MCH: 29.8 pg (ref 26.0–34.0)
Platelets: 231 10*3/uL (ref 150–400)
RBC: 4.23 MIL/uL (ref 4.22–5.81)
RDW: 14 % (ref 11.5–15.5)
WBC: 11.2 10*3/uL — ABNORMAL HIGH (ref 4.0–10.5)

## 2011-06-09 LAB — BASIC METABOLIC PANEL
Calcium: 10.2 mg/dL (ref 8.4–10.5)
Creatinine, Ser: 1.5 mg/dL — ABNORMAL HIGH (ref 0.50–1.35)
GFR calc non Af Amer: 46 mL/min — ABNORMAL LOW (ref >90)
Sodium: 139 mEq/L (ref 135–145)

## 2011-06-09 LAB — PROTIME-INR: Prothrombin Time: 12.5 seconds (ref 11.6–15.2)

## 2011-06-09 NOTE — Pre-Procedure Instructions (Signed)
20 [name] [facility]  06/09/2011   Your procedure is scheduled on:  Friday, November 16TH                                                            Report to [facility] at  5:30 AM.  Call this number if you have problems the morning of surgery: [phone]   Remember:   Do not eat food:After Midnight.  Do not drink clear liquids: 4 Hours before arrival  (1:30 AM).  Take these medicines the morning of surgery with SIP OF WATER -- CARDURA AND              CELEXA  Do not wear jewelry, make-up or nail polish.   Do not wear lotions, powders, or perfumes. You may wear deodorant.   Do not shave 48 hours prior to surgery.   Do not bring valuables to the hospital.   Contacts, dentures or bridgework may not be worn into surgery.   Leave suitcase in the car. After surgery it may be brought to your room.  For patients admitted to the hospital, checkout time is 11:00 AM the day of discharge.   Patients discharged the day of surgery will not be allowed to drive home.  Name and phone number of your driver: PEGGY Kraemer  [phone]                                        Special Instructions: CHG Shower Use Special Wash: 1/2 bottle night before surgery and 1/2 bottle morning of surgery.   Please read over the following fact sheets that you were given: Pain Booklet, Lab Information, MRSA Information and Surgical Site Infection Prevention

## 2011-06-12 MED ORDER — CEFAZOLIN SODIUM-DEXTROSE 2-3 GM-% IV SOLR
2.0000 g | INTRAVENOUS | Status: AC
  Administered 2011-06-13: 2 g via INTRAVENOUS
  Filled 2011-06-12: qty 50

## 2011-06-13 ENCOUNTER — Other Ambulatory Visit: Payer: Self-pay

## 2011-06-13 ENCOUNTER — Inpatient Hospital Stay (HOSPITAL_COMMUNITY): Payer: Medicare Other

## 2011-06-13 ENCOUNTER — Encounter (HOSPITAL_COMMUNITY): Payer: Self-pay

## 2011-06-13 ENCOUNTER — Encounter (HOSPITAL_COMMUNITY)
Admission: RE | Disposition: A | Discharge status: Home / Self Care | Payer: Self-pay | Source: Ambulatory Visit | Attending: Neurosurgery

## 2011-06-13 ENCOUNTER — Inpatient Hospital Stay (HOSPITAL_COMMUNITY)
Admission: RE | Admit: 2011-06-13 | Discharge: 2011-06-16 | DRG: 491 | Disposition: A | Discharge status: Home / Self Care | Payer: Medicare Other | Source: Ambulatory Visit | Attending: Neurosurgery | Admitting: Neurosurgery

## 2011-06-13 DIAGNOSIS — Z8614 Personal history of Methicillin resistant Staphylococcus aureus infection: Secondary | ICD-10-CM

## 2011-06-13 DIAGNOSIS — Z7902 Long term (current) use of antithrombotics/antiplatelets: Secondary | ICD-10-CM

## 2011-06-13 DIAGNOSIS — I2510 Atherosclerotic heart disease of native coronary artery without angina pectoris: Secondary | ICD-10-CM | POA: Diagnosis present

## 2011-06-13 DIAGNOSIS — Z9861 Coronary angioplasty status: Secondary | ICD-10-CM

## 2011-06-13 DIAGNOSIS — J449 Chronic obstructive pulmonary disease, unspecified: Secondary | ICD-10-CM | POA: Diagnosis present

## 2011-06-13 DIAGNOSIS — Z87891 Personal history of nicotine dependence: Secondary | ICD-10-CM

## 2011-06-13 DIAGNOSIS — Z01818 Encounter for other preprocedural examination: Secondary | ICD-10-CM

## 2011-06-13 DIAGNOSIS — I10 Essential (primary) hypertension: Secondary | ICD-10-CM | POA: Diagnosis present

## 2011-06-13 DIAGNOSIS — IMO0002 Reserved for concepts with insufficient information to code with codable children: Secondary | ICD-10-CM

## 2011-06-13 DIAGNOSIS — Z01812 Encounter for preprocedural laboratory examination: Secondary | ICD-10-CM

## 2011-06-13 DIAGNOSIS — D649 Anemia, unspecified: Secondary | ICD-10-CM | POA: Diagnosis not present

## 2011-06-13 DIAGNOSIS — Z79899 Other long term (current) drug therapy: Secondary | ICD-10-CM

## 2011-06-13 DIAGNOSIS — Z951 Presence of aortocoronary bypass graft: Secondary | ICD-10-CM

## 2011-06-13 DIAGNOSIS — M48062 Spinal stenosis, lumbar region with neurogenic claudication: Principal | ICD-10-CM | POA: Diagnosis present

## 2011-06-13 DIAGNOSIS — K219 Gastro-esophageal reflux disease without esophagitis: Secondary | ICD-10-CM | POA: Diagnosis present

## 2011-06-13 DIAGNOSIS — M1990 Unspecified osteoarthritis, unspecified site: Secondary | ICD-10-CM | POA: Diagnosis present

## 2011-06-13 DIAGNOSIS — Z7982 Long term (current) use of aspirin: Secondary | ICD-10-CM

## 2011-06-13 DIAGNOSIS — E785 Hyperlipidemia, unspecified: Secondary | ICD-10-CM | POA: Diagnosis present

## 2011-06-13 DIAGNOSIS — J4489 Other specified chronic obstructive pulmonary disease: Secondary | ICD-10-CM | POA: Diagnosis present

## 2011-06-13 HISTORY — PX: LUMBAR LAMINECTOMY/DECOMPRESSION MICRODISCECTOMY: SHX5026

## 2011-06-13 LAB — SURGICAL PCR SCREEN: MRSA, PCR: NEGATIVE

## 2011-06-13 LAB — CBC
MCV: 91.8 fL (ref 78.0–100.0)
Platelets: 191 10*3/uL (ref 150–400)
RBC: 3.05 MIL/uL — ABNORMAL LOW (ref 4.22–5.81)
WBC: 11.2 10*3/uL — ABNORMAL HIGH (ref 4.0–10.5)

## 2011-06-13 LAB — CREATININE, SERUM
Creatinine, Ser: 1.15 mg/dL (ref 0.50–1.35)
GFR calc Af Amer: 74 mL/min — ABNORMAL LOW (ref >90)
GFR calc non Af Amer: 64 mL/min — ABNORMAL LOW (ref >90)

## 2011-06-13 SURGERY — LUMBAR LAMINECTOMY/DECOMPRESSION MICRODISCECTOMY
Anesthesia: General | Wound class: Clean

## 2011-06-13 MED ORDER — NITROGLYCERIN 0.4 MG SL SUBL
0.4000 mg | SUBLINGUAL_TABLET | SUBLINGUAL | Status: DC | PRN
  Filled 2011-06-13: qty 25

## 2011-06-13 MED ORDER — DOXAZOSIN MESYLATE 8 MG PO TABS
8.0000 mg | ORAL_TABLET | Freq: Every day | ORAL | Status: DC
  Administered 2011-06-13 – 2011-06-15 (×3): 8 mg via ORAL
  Filled 2011-06-13 (×4): qty 1

## 2011-06-13 MED ORDER — PROPOFOL 10 MG/ML IV EMUL
INTRAVENOUS | Status: DC | PRN
  Administered 2011-06-13: 200 mg via INTRAVENOUS

## 2011-06-13 MED ORDER — HYDROMORPHONE HCL PF 1 MG/ML IJ SOLN
0.2500 mg | INTRAMUSCULAR | Status: DC | PRN
  Administered 2011-06-13 (×4): 0.5 mg via INTRAVENOUS

## 2011-06-13 MED ORDER — PREDNISONE 20 MG PO TABS
20.0000 mg | ORAL_TABLET | Freq: Every day | ORAL | Status: DC
  Administered 2011-06-13 – 2011-06-16 (×4): 20 mg via ORAL
  Filled 2011-06-13 (×4): qty 1

## 2011-06-13 MED ORDER — HYDROCORTISONE SOD SUCCINATE 100 MG IJ SOLR
INTRAMUSCULAR | Status: DC | PRN
  Administered 2011-06-13: 100 mg via INTRAVENOUS

## 2011-06-13 MED ORDER — SODIUM CHLORIDE 0.9 % IR SOLN
Status: DC | PRN
  Administered 2011-06-13: 1000 mL

## 2011-06-13 MED ORDER — BUPIVACAINE LIPOSOME 1.3 % IJ SUSP
20.0000 mL | INTRAMUSCULAR | Status: AC
  Administered 2011-06-13: 20 mL
  Filled 2011-06-13: qty 20

## 2011-06-13 MED ORDER — HYDROMORPHONE BOLUS VIA INFUSION
0.5000 mg | INTRAVENOUS | Status: DC | PRN
  Administered 2011-06-13 (×2): 0.5 mg via INTRAVENOUS

## 2011-06-13 MED ORDER — THROMBIN 5000 UNITS EX KIT
PACK | CUTANEOUS | Status: DC | PRN
  Administered 2011-06-13 (×2): 5000 [IU] via TOPICAL

## 2011-06-13 MED ORDER — PHENYLEPHRINE HCL 10 MG/ML IJ SOLN
INTRAMUSCULAR | Status: DC | PRN
  Administered 2011-06-13: 80 ug via INTRAVENOUS
  Administered 2011-06-13: 40 ug via INTRAVENOUS

## 2011-06-13 MED ORDER — DIAZEPAM 5 MG PO TABS
ORAL_TABLET | ORAL | Status: AC
  Administered 2011-06-13: 5 mg via ORAL
  Filled 2011-06-13: qty 1

## 2011-06-13 MED ORDER — CEFAZOLIN SODIUM 1-5 GM-% IV SOLN
1.0000 g | Freq: Three times a day (TID) | INTRAVENOUS | Status: AC
  Administered 2011-06-13 (×2): 1 g via INTRAVENOUS
  Filled 2011-06-13 (×2): qty 50

## 2011-06-13 MED ORDER — HEMOSTATIC AGENTS (NO CHARGE) OPTIME
TOPICAL | Status: DC | PRN
  Administered 2011-06-13: 1 via TOPICAL

## 2011-06-13 MED ORDER — HYDROMORPHONE 0.3 MG/ML IV SOLN
INTRAVENOUS | Status: DC
  Administered 2011-06-13: 7.5 mg via INTRAVENOUS
  Administered 2011-06-13: 16:00:00 via INTRAVENOUS
  Administered 2011-06-14: 4.2 mg via INTRAVENOUS
  Administered 2011-06-14: 7.5 mg via INTRAVENOUS
  Administered 2011-06-14: 2.7 mg via INTRAVENOUS
  Administered 2011-06-14: 4.2 mg via INTRAVENOUS
  Administered 2011-06-14: 7.5 mg via INTRAVENOUS
  Administered 2011-06-14: 4.43 mg via INTRAVENOUS
  Administered 2011-06-14: 3.88 mg via INTRAVENOUS
  Administered 2011-06-14: 7.5 mg via INTRAVENOUS
  Administered 2011-06-14: 8.92 mg via INTRAVENOUS
  Administered 2011-06-15: 2.4 mg via INTRAVENOUS
  Administered 2011-06-15: 2.1 mg via INTRAVENOUS
  Administered 2011-06-15: 3.47 mg via INTRAVENOUS
  Filled 2011-06-13 (×6): qty 25

## 2011-06-13 MED ORDER — SODIUM CHLORIDE 0.9 % IJ SOLN
3.0000 mL | Freq: Two times a day (BID) | INTRAMUSCULAR | Status: DC
  Administered 2011-06-14 – 2011-06-15 (×3): 3 mL via INTRAVENOUS

## 2011-06-13 MED ORDER — ONDANSETRON HCL 4 MG/2ML IJ SOLN: 4.0000 mg | Freq: Four times a day (QID) | INTRAMUSCULAR | Status: DC | PRN

## 2011-06-13 MED ORDER — DIAZEPAM 5 MG/ML PO CONC
5.0000 mg | Freq: Four times a day (QID) | ORAL | Status: DC | PRN
  Administered 2011-06-13: 5 mg via ORAL

## 2011-06-13 MED ORDER — PHENOL 1.4 % MT LIQD: 1.0000 | OROMUCOSAL | Status: DC | PRN

## 2011-06-13 MED ORDER — ONDANSETRON HCL 4 MG/2ML IJ SOLN
4.0000 mg | INTRAMUSCULAR | Status: DC | PRN
  Administered 2011-06-14 (×2): 4 mg via INTRAVENOUS
  Filled 2011-06-13 (×2): qty 2

## 2011-06-13 MED ORDER — SODIUM CHLORIDE 0.9 % IJ SOLN: 9.0000 mL | INTRAMUSCULAR | Status: DC | PRN

## 2011-06-13 MED ORDER — DIPHENHYDRAMINE HCL 50 MG/ML IJ SOLN
12.5000 mg | Freq: Four times a day (QID) | INTRAMUSCULAR | Status: DC | PRN
  Administered 2011-06-14 – 2011-06-15 (×2): 12.5 mg via INTRAVENOUS
  Filled 2011-06-13 (×2): qty 1

## 2011-06-13 MED ORDER — POTASSIUM CHLORIDE IN NACL 20-0.9 MEQ/L-% IV SOLN
INTRAVENOUS | Status: DC
  Administered 2011-06-13 – 2011-06-14 (×2): via INTRAVENOUS
  Filled 2011-06-13 (×4): qty 1000

## 2011-06-13 MED ORDER — SIMVASTATIN 40 MG PO TABS
40.0000 mg | ORAL_TABLET | Freq: Every day | ORAL | Status: DC
  Administered 2011-06-13 – 2011-06-15 (×3): 40 mg via ORAL
  Filled 2011-06-13 (×4): qty 1

## 2011-06-13 MED ORDER — ONDANSETRON HCL 4 MG/2ML IJ SOLN
INTRAMUSCULAR | Status: DC | PRN
  Administered 2011-06-13: 4 mg via INTRAVENOUS

## 2011-06-13 MED ORDER — GLYCOPYRROLATE 0.2 MG/ML IJ SOLN
INTRAMUSCULAR | Status: DC | PRN
  Administered 2011-06-13: .8 mg via INTRAVENOUS

## 2011-06-13 MED ORDER — MENTHOL 3 MG MT LOZG: 1.0000 | LOZENGE | OROMUCOSAL | Status: DC | PRN

## 2011-06-13 MED ORDER — ZOLPIDEM TARTRATE 5 MG PO TABS
5.0000 mg | ORAL_TABLET | Freq: Every evening | ORAL | Status: DC | PRN
  Administered 2011-06-14: 5 mg via ORAL
  Filled 2011-06-13: qty 1

## 2011-06-13 MED ORDER — DIPHENHYDRAMINE HCL 12.5 MG/5ML PO ELIX
12.5000 mg | ORAL_SOLUTION | Freq: Four times a day (QID) | ORAL | Status: DC | PRN
  Administered 2011-06-15 (×2): 12.5 mg via ORAL
  Filled 2011-06-13 (×2): qty 10

## 2011-06-13 MED ORDER — LIDOCAINE HCL (CARDIAC) 20 MG/ML IV SOLN
INTRAVENOUS | Status: DC | PRN
  Administered 2011-06-13: 60 mg via INTRAVENOUS

## 2011-06-13 MED ORDER — NEOSTIGMINE METHYLSULFATE 1 MG/ML IJ SOLN
INTRAMUSCULAR | Status: DC | PRN
  Administered 2011-06-13: 5 mg via INTRAVENOUS

## 2011-06-13 MED ORDER — FENTANYL CITRATE 0.05 MG/ML IJ SOLN
INTRAMUSCULAR | Status: DC | PRN
  Administered 2011-06-13: 50 ug via INTRAVENOUS
  Administered 2011-06-13: 100 ug via INTRAVENOUS
  Administered 2011-06-13 (×3): 50 ug via INTRAVENOUS

## 2011-06-13 MED ORDER — SODIUM CHLORIDE 0.9 % IJ SOLN: 3.0000 mL | INTRAMUSCULAR | Status: DC | PRN

## 2011-06-13 MED ORDER — DOCUSATE SODIUM 100 MG PO CAPS
100.0000 mg | ORAL_CAPSULE | Freq: Two times a day (BID) | ORAL | Status: DC
  Administered 2011-06-13 – 2011-06-16 (×7): 100 mg via ORAL
  Filled 2011-06-13 (×7): qty 1

## 2011-06-13 MED ORDER — ACETAMINOPHEN 325 MG PO TABS: 650.0000 mg | ORAL_TABLET | ORAL | Status: DC | PRN

## 2011-06-13 MED ORDER — ACETAMINOPHEN 650 MG RE SUPP: 650.0000 mg | RECTAL | Status: DC | PRN

## 2011-06-13 MED ORDER — EPHEDRINE SULFATE 50 MG/ML IJ SOLN
INTRAMUSCULAR | Status: DC | PRN
  Administered 2011-06-13 (×2): 5 mg via INTRAVENOUS
  Administered 2011-06-13 (×3): 10 mg via INTRAVENOUS
  Administered 2011-06-13: 5 mg via INTRAVENOUS

## 2011-06-13 MED ORDER — HYDROMORPHONE HCL PF 1 MG/ML IJ SOLN: 1.0000 mg | INTRAMUSCULAR | Status: DC | PRN

## 2011-06-13 MED ORDER — ROCURONIUM BROMIDE 100 MG/10ML IV SOLN
INTRAVENOUS | Status: DC | PRN
  Administered 2011-06-13: 50 mg via INTRAVENOUS
  Administered 2011-06-13: 20 mg via INTRAVENOUS

## 2011-06-13 MED ORDER — SODIUM CHLORIDE 0.9 % IV SOLN: 250.0000 mL | INTRAVENOUS | Status: DC

## 2011-06-13 MED ORDER — CITALOPRAM HYDROBROMIDE 20 MG PO TABS
20.0000 mg | ORAL_TABLET | Freq: Every day | ORAL | Status: DC
  Administered 2011-06-13 – 2011-06-16 (×4): 20 mg via ORAL
  Filled 2011-06-13 (×4): qty 1

## 2011-06-13 MED ORDER — NALOXONE HCL 0.4 MG/ML IJ SOLN: 0.4000 mg | INTRAMUSCULAR | Status: DC | PRN

## 2011-06-13 MED ORDER — ONDANSETRON HCL 4 MG/2ML IJ SOLN: 4.0000 mg | Freq: Once | INTRAMUSCULAR | Status: DC | PRN

## 2011-06-13 MED ORDER — ALBUMIN HUMAN 5 % IV SOLN
INTRAVENOUS | Status: DC | PRN
  Administered 2011-06-13 (×2): via INTRAVENOUS

## 2011-06-13 MED ORDER — MUPIROCIN 2 % EX OINT
TOPICAL_OINTMENT | CUTANEOUS | Status: AC
  Filled 2011-06-13: qty 22

## 2011-06-13 MED ORDER — BUPIVACAINE-EPINEPHRINE PF 0.5-1:200000 % IJ SOLN
INTRAMUSCULAR | Status: DC | PRN
  Administered 2011-06-13: 30 mL

## 2011-06-13 MED ORDER — HEPARIN SODIUM (PORCINE) 5000 UNIT/ML IJ SOLN
5000.0000 [IU] | Freq: Three times a day (TID) | INTRAMUSCULAR | Status: DC
  Filled 2011-06-13 (×3): qty 1

## 2011-06-13 MED ORDER — LACTATED RINGERS IV SOLN
INTRAVENOUS | Status: DC | PRN
  Administered 2011-06-13 (×3): via INTRAVENOUS

## 2011-06-13 SURGICAL SUPPLY — 58 items
BENZOIN TINCTURE PRP APPL 2/3 (GAUZE/BANDAGES/DRESSINGS) ×2 IMPLANT
BLADE SURG ROTATE 9660 (MISCELLANEOUS) IMPLANT
BUR ACORN 6.0 (BURR) ×2 IMPLANT
BUR MATCHSTICK NEURO 3.0 LAGG (BURR) ×2 IMPLANT
CANISTER SUCTION 2500CC (MISCELLANEOUS) ×2 IMPLANT
CLOTH BEACON ORANGE TIMEOUT ST (SAFETY) ×2 IMPLANT
CONT SPEC 4OZ CLIKSEAL STRL BL (MISCELLANEOUS) ×2 IMPLANT
DRAPE LAPAROTOMY 100X72X124 (DRAPES) ×2 IMPLANT
DRAPE MICROSCOPE LEICA (MISCELLANEOUS) ×2 IMPLANT
DRAPE POUCH INSTRU U-SHP 10X18 (DRAPES) ×2 IMPLANT
DRSG PAD ABDOMINAL 8X10 ST (GAUZE/BANDAGES/DRESSINGS) ×2 IMPLANT
DURAPREP 26ML APPLICATOR (WOUND CARE) ×2 IMPLANT
DURASEAL SPINE SEALANT 3 ML ×2 IMPLANT
ELECT BLADE 4.0 EZ CLEAN MEGAD (MISCELLANEOUS) ×2
ELECT REM PT RETURN 9FT ADLT (ELECTROSURGICAL) ×2
ELECTRODE BLDE 4.0 EZ CLN MEGD (MISCELLANEOUS) ×1 IMPLANT
ELECTRODE REM PT RTRN 9FT ADLT (ELECTROSURGICAL) ×1 IMPLANT
GAUZE SPONGE 4X4 16PLY XRAY LF (GAUZE/BANDAGES/DRESSINGS) ×2 IMPLANT
GLOVE BIOGEL M 8.0 STRL (GLOVE) ×2 IMPLANT
GLOVE ECLIPSE 7.5 STRL STRAW (GLOVE) ×4 IMPLANT
GLOVE ECLIPSE 8.5 STRL (GLOVE) ×2 IMPLANT
GLOVE EXAM NITRILE LRG STRL (GLOVE) IMPLANT
GLOVE EXAM NITRILE MD LF STRL (GLOVE) IMPLANT
GLOVE EXAM NITRILE XL STR (GLOVE) IMPLANT
GLOVE EXAM NITRILE XS STR PU (GLOVE) IMPLANT
GLOVE INDICATOR 8.5 STRL (GLOVE) ×2 IMPLANT
GOWN BRE IMP SLV AUR LG STRL (GOWN DISPOSABLE) ×2 IMPLANT
GOWN BRE IMP SLV AUR XL STRL (GOWN DISPOSABLE) ×4 IMPLANT
GOWN STRL REIN 2XL LVL4 (GOWN DISPOSABLE) ×4 IMPLANT
KIT BASIN OR (CUSTOM PROCEDURE TRAY) ×2 IMPLANT
KIT ROOM TURNOVER OR (KITS) ×2 IMPLANT
NEEDLE HYPO 18GX1.5 BLUNT FILL (NEEDLE) IMPLANT
NEEDLE HYPO 21X1.5 SAFETY (NEEDLE) IMPLANT
NEEDLE HYPO 25X1 1.5 SAFETY (NEEDLE) IMPLANT
NEEDLE SPNL 20GX3.5 QUINCKE YW (NEEDLE) ×2 IMPLANT
NS IRRIG 1000ML POUR BTL (IV SOLUTION) ×2 IMPLANT
PACK LAMINECTOMY NEURO (CUSTOM PROCEDURE TRAY) ×2 IMPLANT
PAD ARMBOARD 7.5X6 YLW CONV (MISCELLANEOUS) ×8 IMPLANT
PATTIES SURGICAL .5 X1 (DISPOSABLE) ×2 IMPLANT
PATTIES SURGICAL 1X1 (DISPOSABLE) ×2 IMPLANT
RUBBERBAND STERILE (MISCELLANEOUS) ×4 IMPLANT
SPONGE GAUZE 4X4 12PLY (GAUZE/BANDAGES/DRESSINGS) ×2 IMPLANT
SPONGE LAP 4X18 X RAY DECT (DISPOSABLE) IMPLANT
SPONGE SURGIFOAM ABS GEL SZ50 (HEMOSTASIS) ×2 IMPLANT
STAPLER SKIN PROX WIDE 3.9 (STAPLE) ×2 IMPLANT
STRIP CLOSURE SKIN 1/2X4 (GAUZE/BANDAGES/DRESSINGS) ×2 IMPLANT
SUT PROLENE 6 0 BV (SUTURE) ×6 IMPLANT
SUT VIC AB 0 CT1 18XCR BRD8 (SUTURE) ×1 IMPLANT
SUT VIC AB 0 CT1 8-18 (SUTURE) ×1
SUT VIC AB 2-0 CP2 18 (SUTURE) ×2 IMPLANT
SUT VIC AB 3-0 SH 8-18 (SUTURE) ×2 IMPLANT
SYR 20CC LL (SYRINGE) IMPLANT
SYR 20ML ECCENTRIC (SYRINGE) ×2 IMPLANT
SYR 5ML LL (SYRINGE) IMPLANT
TAPE CLOTH SURG 4X10 WHT LF (GAUZE/BANDAGES/DRESSINGS) ×2 IMPLANT
TOWEL OR 17X24 6PK STRL BLUE (TOWEL DISPOSABLE) ×2 IMPLANT
TOWEL OR 17X26 10 PK STRL BLUE (TOWEL DISPOSABLE) ×2 IMPLANT
WATER STERILE IRR 1000ML POUR (IV SOLUTION) ×2 IMPLANT

## 2011-06-13 NOTE — H&P (Signed)
[name] is an 70 y.o. male.   Chief Complaint:lower back pain with radiation to both lower extremities HPI: back pain with radiation to lower extremities which gets worse with walking. Has failed with  Past Medical History  Diagnosis Date  . HTN (hypertension)   . Hyperlipidemia   . Coronary artery disease     IN 2000   STENT PLACED IN 2012  . Hx of cardiac cath     STENT PLACED IN 2012  . Small bowel problem     HAD ALOT OF SCAR TISSUE FROM PREVIOUS SURGERIES..NG WAS INSERTED ...Marland KitchenMarland KitchenNO SURGERY NEEDED...Marland KitchenMarland KitchenIN FOR 8 DAYS  . Disorder of blood     BEEN TREATED BY DERMATOLOGIST X 4 YRS..."BLOOD BLISTERS"    Past Surgical History  Procedure Date  . Right shoulder dr Romeo Apple   . Coronary stent placement   . Coronary artery bypass graft   . Sternal surg     HAS HAD 5-6 ON HIS STERNUM  . Coronary angioplasty   . Cataracts     BILATERAL  . Tonsillectomy   . Cholecystectomy     Family History  Problem Relation Age of Onset  . Heart disease    . Hypertension Mother   . Hypertension Maternal Aunt   . Hypertension Maternal Uncle    Social History:  reports that he quit smoking about 12 years ago. His smoking use included Cigarettes. He has a 1 pack-year smoking history. He does not have any smokeless tobacco history on file. He reports that he does not drink alcohol or use illicit drugs.  Allergies:  Allergies  Allergen Reactions  . Metoprolol     REACTION: bradycardia  . Morphine     REACTION: made him go crazy    Medications Prior to Admission  Medication Dose Route Frequency Provider Last Rate Last Dose  . ceFAZolin (ANCEF) IVPB 2 g/50 mL premix  2 g Intravenous 60 min Pre-Op Eugene Garnet, PHARMD      . mupirocin ointment (BACTROBAN) 2 %            Medications Prior to Admission  Medication Sig Dispense Refill  . aspirin 81 MG tablet Take 81 mg by mouth daily.        . citalopram (CELEXA) 20 MG tablet Take 20 mg by mouth daily.       Marland Kitchen doxazosin  (CARDURA) 8 MG tablet Take 8 mg by mouth at bedtime.       . predniSONE (DELTASONE) 20 MG tablet Take 20 mg by mouth daily.       . clopidogrel (PLAVIX) 75 MG tablet Take 1 tablet (75 mg total) by mouth daily.  30 tablet  6  . nitroGLYCERIN (NITROSTAT) 0.4 MG SL tablet Place 1 tablet (0.4 mg total) under the tongue every 5 (five) minutes as needed.  25 tablet  3    No results found for this or any previous visit (from the past 48 hour(s)). No results found.  Review of Systems  HENT: Negative.   Eyes: Negative.   Respiratory: Negative.   Cardiovascular: Negative.   Gastrointestinal: Negative.   Musculoskeletal: Negative.   Skin: Negative.   Neurological: Positive for sensory change and weakness.  Endo/Heme/Allergies: Negative.   Psychiatric/Behavioral: Negative.     Blood pressure 188/78, pulse 64, temperature 98.4 F (36.9 C), temperature source Oral, resp. rate 18, weight 94 kg (207 lb 3.7 oz), SpO2 97.00%. Physical Exam  Constitutional: He is oriented to person, place, and time. He appears  well-developed.  HENT:  Head: Normocephalic.  Eyes: Pupils are equal, round, and reactive to light.  Neck: Normal range of motion.  Cardiovascular: Normal rate.   Respiratory: Effort normal.  GI: Soft.  Musculoskeletal: Normal range of motion.  Neurological: He is oriented to person, place, and time.  Skin: Skin is warm.  Psychiatric: He has a normal mood and affect.   slr positive at 45 degrees. Femoral maneuver positive bilaterally Mri was positive for stenosis at l3 to l5 ,borderline at l23 Assessment/Plan decompresive laminectomies and foraminotomies at le to l5  [name] M 06/13/2011, 7:58 AM

## 2011-06-13 NOTE — Brief Op Note (Signed)
06/13/2011  10:39 AM  PATIENT:  [name]  70 y.o. male  PRE-OPERATIVE DIAGNOSIS:  lumbar stenosis/lumbar radiculopathy  POST-OPERATIVE DIAGNOSIS:  lumbar stenosis/lumbar radiculopathy  PROCEDURE:  Procedure(s): LUMBAR LAMINECTOMY/DECOMPRESSION MICRODISCECTOMY  SURGEON:  Surgeon(s): Ola Spurr Elsner  PHYSICIAN ASSISTANT:   ASSISTANTS: none   ANESTHESIA:     EBL:  Total I/O In: 3000 [I.V.:2500; IV Piggyback:500] Out: 1100 [Blood:1100]  BLOOD ADMINISTERED:none  DRAINS: none   LOCAL MEDICATIONS USED:  OTHER experel SPECIMEN:  No Specimen  DISPOSITION OF SPECIMEN:  N/A  COUNTS:  YES  TOURNIQUET:    DICTATION: .[id number]  PLAN OF CARE: Admit to inpatient   PATIENT DISPOSITION:  PACU - hemodynamically stable.   Delay start of Pharmacological VTE agent (>24hrs) due to surgical blood loss or risk of bleeding:  {YES/NO/NOT APPLICABLE:20182

## 2011-06-13 NOTE — Progress Notes (Signed)
Dr.smith and Dr.Botero both made aware by CRNA of H/H 8.8/26. No new orders.

## 2011-06-13 NOTE — Brief Op Note (Signed)
06/13/2011  5:01 PM  PATIENT:  [name]  70 y.o. male  PRE-OPERATIVE DIAGNOSIS:  lumbar stenosis/lumbar radiculopathy  POST-OPERATIVE DIAGNOSIS:  lumbar stenosis/lumbar radiculopathy  PROCEDURE:  Procedure(s): LUMBAR LAMINECTOMY/DECOMPRESSION MICRODISCECTOMY  SURGEON:  Surgeon(s): Ola Spurr Elsner  PHYSICIAN ASSISTANT:   ASSISTANTS: elsner   ANESTHESIA:   general  EBL:  Total I/O In: 3500 [I.V.:3000; IV Piggyback:500] Out: 1100 [Blood:1100]  BLOOD ADMINISTERED  DRAINS: none   LOCAL MEDICATIONS USED:  OTHER experall  SPECIMEN:  No Specimen  DISPOSITION OF SPECIMEN:  N/A  COUNTS:  YES  TOURNIQUET: DICTATION: .Note written in EPIC and Other Dictation: Dictation Number   PLAN OF CARE: Admit to inpatient   PATIENT DISPOSITION:  PACU - hemodynamically stable.   Delay start of Pharmacological VTE agent (>24hrs) due to surgical blood loss or risk of bleeding: APPLICABLE:20182

## 2011-06-13 NOTE — Anesthesia Postprocedure Evaluation (Signed)
  Anesthesia Post-op Note  Patient: [name]  Procedure(s) Performed:  LUMBAR LAMINECTOMY/DECOMPRESSION MICRODISCECTOMY - Lumbar three, lumbar four-five Laminectomy  Patient Location: PACU  Anesthesia Type: General  Level of Consciousness: awake and oriented  Airway and Oxygen Therapy: Patient Spontanous Breathing and Patient connected to face mask oxygen  Post-op Pain: moderate  Post-op Assessment: Post-op Vital signs reviewed and Patient's Cardiovascular Status Stable  Post-op Vital Signs: Reviewed and stable  Complications: No apparent anesthesia complications2

## 2011-06-13 NOTE — Anesthesia Procedure Notes (Signed)
Procedure Name: Intubation Date/Time: 06/13/2011 8:27 AM Performed by: Malachi Pro Pre-anesthesia Checklist: Patient identified, Emergency Drugs available, Suction available, Patient being monitored and Timeout performed Patient Re-evaluated:Patient Re-evaluated prior to inductionOxygen Delivery Method: Circle System Utilized Preoxygenation: Pre-oxygenation with 100% oxygen Intubation Type: Combination inhalational/ intravenous induction Ventilation: Oral airway inserted - appropriate to patient size and Mask ventilation without difficulty Laryngoscope Size: Mac and 3 Grade View: Grade I Tube size: 7.5 mm Number of attempts: 1 Airway Equipment and Method: stylet Placement Confirmation: ETT inserted through vocal cords under direct vision,  positive ETCO2 and breath sounds checked- equal and bilateral Secured at: 22 cm Tube secured with: Tape Dental Injury: Teeth and Oropharynx as per pre-operative assessment

## 2011-06-13 NOTE — Anesthesia Preprocedure Evaluation (Addendum)
Anesthesia Evaluation  Patient identified by MRN, date of birth, ID band Patient awake    Reviewed: Allergy & Precautions, H&P , NPO status , Patient's Chart, lab work & pertinent test results  History of Anesthesia Complications Negative for: history of anesthetic complications  Airway Mallampati: II TM Distance: >3 FB Neck ROM: Full    Dental  (+) Dental Advisory Given, Missing and Edentulous Upper   Pulmonary COPDformer smoker         Cardiovascular hypertension, Pt. on medications + CAD, + Cardiac Stents and + CABG regular Normal    Neuro/Psych    GI/Hepatic Neg liver ROS, GERD-  Controlled,  Endo/Other  Morbid obesity  Renal/GU   Genitourinary negative   Musculoskeletal  (+) Arthritis -,   Abdominal   Peds  Hematology negative hematology ROS (+)   Anesthesia Other Findings   Reproductive/Obstetrics                         Anesthesia Physical Anesthesia Plan  ASA: III  Anesthesia Plan: General   Post-op Pain Management:    Induction: Intravenous  Airway Management Planned: Oral ETT  Additional Equipment:   Intra-op Plan:   Post-operative Plan: Extubation in OR  Informed Consent: I have reviewed the patients History and Physical, chart, labs and discussed the procedure including the risks, benefits and alternatives for the proposed anesthesia with the patient or authorized representative who has indicated his/her understanding and acceptance.   Dental advisory given and Dental Advisory Given  Plan Discussed with: Anesthesiologist, CRNA and Surgeon  Anesthesia Plan Comments:        Anesthesia Quick Evaluation

## 2011-06-13 NOTE — Transfer of Care (Signed)
Immediate Anesthesia Transfer of Care Note  Patient: [name]  Procedure(s) Performed:  LUMBAR LAMINECTOMY/DECOMPRESSION MICRODISCECTOMY - Lumbar three, lumbar four-five Laminectomy  Patient Location: PACU  Anesthesia Type: General  Level of Consciousness: awake, alert , oriented and patient cooperative  Airway & Oxygen Therapy: Patient Spontanous Breathing and Patient connected to nasal cannula oxygen  Post-op Assessment: Report given to PACU RN, Post -op Vital signs reviewed and stable and Patient moving all extremities  Post vital signs: stable  Complications: No apparent anesthesia complications

## 2011-06-13 NOTE — Preoperative (Addendum)
Beta Blockers   Reason not to administer Beta Blockers:Not Applicable 

## 2011-06-13 NOTE — Progress Notes (Signed)
Orthopedic Tech Progress Note Patient Details:  [name] 08-08-1940 [id number]  Other Ortho Devices Type of Ortho Device: Other (comment) (cervical collar) Ortho Device Location: neck   Nikki Dom 06/13/2011, 6:34 PM

## 2011-06-13 NOTE — Progress Notes (Signed)
C/o of severe neck pain with some radiation to left shoulder. No lumbar pain or pain to lower extremties.no headache.

## 2011-06-14 LAB — CBC
HCT: 27.7 % — ABNORMAL LOW (ref 39.0–52.0)
Platelets: 174 10*3/uL (ref 150–400)
RBC: 2.95 MIL/uL — ABNORMAL LOW (ref 4.22–5.81)
RDW: 14.9 % (ref 11.5–15.5)
WBC: 13.5 10*3/uL — ABNORMAL HIGH (ref 4.0–10.5)

## 2011-06-14 MED ORDER — DIAZEPAM 5 MG/ML IJ SOLN: 5.0000 mg | Freq: Four times a day (QID) | INTRAMUSCULAR | Status: DC | PRN

## 2011-06-14 MED ORDER — DIAZEPAM 5 MG PO TABS
5.0000 mg | ORAL_TABLET | Freq: Four times a day (QID) | ORAL | Status: DC | PRN
  Administered 2011-06-13 – 2011-06-15 (×4): 5 mg via ORAL
  Filled 2011-06-14 (×3): qty 1

## 2011-06-14 NOTE — Op Note (Addendum)
NAMEHAVOC, SANLUIS             ACCOUNT NO.:  [id number]  MEDICAL RECORD NO.:  [id number]  LOCATION:  [facility]                         FACILITY:  [facility]  PHYSICIAN:  Hilda Lias, M.D.   DATE OF BIRTH:  April 29, 1941  DATE OF PROCEDURE:  06/13/2011 DATE OF DISCHARGE:                              OPERATIVE REPORT   PREOPERATIVE DIAGNOSIS:  Lumbar stenosis with neurogenic claudication, chronic radiculopathy, L3-4, L4-5.  POSTOPERATIVE DIAGNOSIS:  Lumbar stenosis with neurogenic claudication, chronic radiculopathy, L3-4, L4-5.  PROCEDURE:  Bilateral L3-4, L5 laminectomy, foraminotomy, partial laminectomy of L2, decompression of the thecal sac.  Microscope.  SURGEON:  Hilda Lias, MD  ASSISTANT:  Stefani Dama, MD  CLINICAL HISTORY:  [name] is a 70 year old gentleman who had been complaining of back pain with symptoms in both legs.  The patient complaining so much up to the point now walking less than a block, he had to stop because he has quite a bit of pain in both legs, it goes away with rest.  He had failed with conservative treatment.  MRI shows severe stenosis from L3 down to L5, S1, borderline at__________  L2 -3. Surgery was advised.  The risk was fully explained to him in my office including the possibility of infection, CSF leak, and also the possibility of further surgery.  PROCEDURE:  The patient was taken to the operating room and after intubation, he was positioned in prone manner.  This patient has been taking Plavix for several years.  The incision was started in the midline from L4-5, all the way up to 2-3.  _Muscles_________ were retracted laterally.  The patient had quite a bit of bleeding secondary to his chronic intake of Plavix.  Hemostasis was achieved with bipolar.  Then, with the Leksell we removed the spinous process of L3-4, L5, and with a Drill_we did laminectomies_________ bilaterally.  The patient had quite a bit of stenosis, worse at  the L4-5 up to the point that the dura mater was quite thin. There was an opening in the dura itself.  This was taking care with a single stitch of 6-0 Prolene.  Using the microscope with decompress the L3, L4, L5, and S1 nerve root.  The patient had quite a bit of thickening of the yellow ligament.  Then, we drilled the lower lamina of L2 and we decompressed the L2-L3 space.  At the end, we had good decompression from L3, all the way down to L5-S1 with plenty of space with the foramen.  Valsalva maneuver was negative.  The area was irrigated and the wound was closed with different layers using Vicryl as well as staple.  The patient is going to PACU.          ______________________________ Hilda Lias, M.D.     EB/MEDQ  D:  06/13/2011  T:  06/13/2011  Job:  [id number]

## 2011-06-14 NOTE — Plan of Care (Signed)
Problem: Consults Goal: Diagnosis - Spinal Surgery Microdiscectomy     

## 2011-06-14 NOTE — Progress Notes (Signed)
Subjective: Patient reports Neck pain, generalized discomfort  Objective: Vital signs in last 24 hours: Temp:  [97.7 F (36.5 C)-98.2 F (36.8 C)] 98 F (36.7 C) (11/17 0933) Pulse Rate:  [66-97] 87  (11/17 0933) Resp:  [17-23] 19  (11/17 0933) BP: (107-128)/(63-74) 115/68 mmHg (11/17 0933) SpO2:  [92 %-100 %] 92 % (11/17 0933) Weight:  [66.9 kg (147 lb 7.8 oz)] 147 lb 7.8 oz (66.9 kg) (11/17 0500)  Intake/Output from previous day: 11/16 0701 - 11/17 0700 In: 3500 [I.V.:3000; IV Piggyback:500] Out: 1900 [Urine:800; Blood:1100] Intake/Output this shift:    Dressing clean dry and intact lower extremity motor strength 4/5 iliopsoas quadricep tibialis anterior and gastrocs bilaterally area neck is supple tender and supraclavicular fossa is bilaterally moderate spasm and trapezii. Upper extremity strength normal  Lab Results:  Basename 06/14/11 0700 06/13/11 1338  WBC 13.5* 11.2*  HGB 8.9* 9.2*  HCT 27.7* 28.0*  PLT 174 191   BMET  Basename 06/13/11 1338  NA --  K --  CL --  CO2 --  GLUCOSE --  BUN --  CREATININE 1.15  CALCIUM --    Studies/Results: Dg Lumbar Spine 1 View  06/13/2011  *RADIOLOGY REPORT*  Clinical Data: L3-4 and L4-5 laminectomy  LUMBAR SPINE - 1 VIEW  Comparison: MRI 11/06  Findings: An initial film shows tissue spreaders posteriorly with probes at the pedicle level of L2 and the disc level of L4-5.  IMPRESSION: Pedicle level L2 and disc level of L4-5 localized.  Original Report Authenticated By: Thomasenia Sales, M.D.    Assessment/Plan: Status post 24 hours bedrest after lumbar laminectomy L3-L5 incision cleaned and dry area  LOS: 1 day  Mobilize today out of bed PT and OT   Babette Stum J 06/14/2011, 11:19 AM

## 2011-06-15 MED ORDER — OXYCODONE-ACETAMINOPHEN 5-325 MG PO TABS
1.0000 | ORAL_TABLET | ORAL | Status: DC | PRN
  Administered 2011-06-15 – 2011-06-16 (×5): 2 via ORAL
  Filled 2011-06-15 (×5): qty 2

## 2011-06-15 NOTE — Progress Notes (Signed)
Pt is very anxious and somewhat agitated, c/o pain and itchiness. Pt sts last time he was in hospital he had "bloody blisters all over his body" and is scared that blisters may be returning. Assessed pt's back and reassured him no blisters were present. Also gave him 10 min back rub. Explained that itchiness may come from medication and administered Benadryl. Will continue to monitor.

## 2011-06-15 NOTE — Progress Notes (Signed)
Subjective: Patient reports Persistent back pain. Minimal radiation. All having difficulty getting out of bed secondary to pain. Requested Foley be left in one additional day secondary to poor mobility. Patient also notes itching. Thinks it may be related to the IV narcotics from his PCA  Objective: Vital signs in last 24 hours: Temp:  [97.9 F (36.6 C)-98.3 F (36.8 C)] 97.9 F (36.6 C) (11/18 0953) Pulse Rate:  [86-106] 106  (11/18 0953) Resp:  [18-20] 18  (11/18 0953) BP: (107-150)/(50-74) 110/50 mmHg (11/18 0953) SpO2:  [2 %-97 %] 93 % (11/18 0953)  Intake/Output from previous day: 11/17 0701 - 11/18 0700 In: 240 [P.O.:240] Out: 575 [Urine:575] Intake/Output this shift:    On examination: Patient is awake and alert. He is oriented and appropriate. Cranial nerve function is intact. Motor examination extremities is stable with some mild persistent weakness of his lower extremities bilaterally. There is no focal areas of motor loss. Sensory examination is stable. His wound dressing is clean dry and intact. Patient denies any postural headache. abdomen is soft and flat. Chest is cord auscultation.  Lab Results:  Basename 06/14/11 0700 06/13/11 1338  WBC 13.5* 11.2*  HGB 8.9* 9.2*  HCT 27.7* 28.0*  PLT 174 191   BMET  Basename 06/13/11 1338  NA --  K --  CL --  CO2 --  GLUCOSE --  BUN --  CREATININE 1.15  CALCIUM --    Studies/Results: No results found.  Assessment/Plan: Progressing slowly following lumbar decompression. Continue efforts at mobilization. Stop PCA.  LOS: 2 days     Loran Auguste A 06/15/2011, 11:03 AM

## 2011-06-15 NOTE — Progress Notes (Addendum)
Dilaudid wasted is 4.8mg . Witnessed by Donata Clay in sink. Witnessed Gladstone Lighter

## 2011-06-16 LAB — POCT I-STAT EG7
Calcium, Ion: 1.23 mmol/L (ref 1.12–1.32)
HCT: 26 % — ABNORMAL LOW (ref 39.0–52.0)
O2 Saturation: 94 %
pCO2, Ven: 40.3 mmHg — ABNORMAL LOW (ref 45.0–50.0)
pO2, Ven: 71 mmHg — ABNORMAL HIGH (ref 30.0–45.0)

## 2011-06-16 SURGERY — ADENOIDECTOMY
Anesthesia: General

## 2011-06-16 NOTE — Discharge Summary (Signed)
Physician Discharge Summary  Patient ID: JEOFFREY ELEAZER MRN: [id number] DOB/AGE: 68/12/1940 68 y.o.  Admit date: 06/13/2011 Discharge date: 06/16/2011  Admission Diagnoses:lumbar stenosis  Discharge Diagnoses: lumbar stenosis. postpop anemia. Cervical radiculopathy   Discharged Condition: good  Hospital Course: lumbar laminectomies on 06/13/2011  Consults: none  Significant Diagnostic Studies: labs: cbc  Treatments: surgery  Discharge Exam: Blood pressure 152/68, pulse 78, temperature 98 F (36.7 C), temperature source Oral, resp. rate 18, height 6' (1.829 m), weight 66.9 kg (147 lb 7.8 oz), SpO2 91.00%. Incision/Wound:  Disposition: Home or Self Care   Current Discharge Medication List    CONTINUE these medications which have NOT CHANGED   Details  aspirin 81 MG tablet Take 81 mg by mouth daily.      atorvastatin (LIPITOR) 40 MG tablet Take 20 mg by mouth daily.      citalopram (CELEXA) 20 MG tablet Take 20 mg by mouth daily.     doxazosin (CARDURA) 8 MG tablet Take 8 mg by mouth at bedtime.     predniSONE (DELTASONE) 20 MG tablet Take 20 mg by mouth daily.     clopidogrel (PLAVIX) 75 MG tablet Take 1 tablet (75 mg total) by mouth daily. Qty: 30 tablet, Refills: 6    nitroGLYCERIN (NITROSTAT) 0.4 MG SL tablet Place 1 tablet (0.4 mg total) under the tongue every 5 (five) minutes as needed. Qty: 25 tablet, Refills: 3         Signed: Erek Kowal M 06/16/2011, 9:19 AM

## 2011-06-17 ENCOUNTER — Encounter (HOSPITAL_COMMUNITY): Payer: Self-pay | Admitting: Neurosurgery

## 2011-07-16 ENCOUNTER — Encounter (HOSPITAL_COMMUNITY): Payer: Self-pay | Admitting: Emergency Medicine

## 2011-07-16 ENCOUNTER — Emergency Department (HOSPITAL_COMMUNITY)
Admission: EM | Admit: 2011-07-16 | Discharge: 2011-07-16 | Disposition: A | Discharge status: Home / Self Care | Payer: Medicare Other | Attending: Emergency Medicine | Admitting: Emergency Medicine

## 2011-07-16 ENCOUNTER — Emergency Department (HOSPITAL_COMMUNITY): Payer: Medicare Other

## 2011-07-16 DIAGNOSIS — I2510 Atherosclerotic heart disease of native coronary artery without angina pectoris: Secondary | ICD-10-CM | POA: Insufficient documentation

## 2011-07-16 DIAGNOSIS — R1031 Right lower quadrant pain: Secondary | ICD-10-CM | POA: Insufficient documentation

## 2011-07-16 DIAGNOSIS — I10 Essential (primary) hypertension: Secondary | ICD-10-CM | POA: Insufficient documentation

## 2011-07-16 DIAGNOSIS — K5900 Constipation, unspecified: Secondary | ICD-10-CM | POA: Insufficient documentation

## 2011-07-16 DIAGNOSIS — R1032 Left lower quadrant pain: Secondary | ICD-10-CM | POA: Insufficient documentation

## 2011-07-16 DIAGNOSIS — Z951 Presence of aortocoronary bypass graft: Secondary | ICD-10-CM | POA: Insufficient documentation

## 2011-07-16 DIAGNOSIS — R109 Unspecified abdominal pain: Secondary | ICD-10-CM | POA: Insufficient documentation

## 2011-07-16 DIAGNOSIS — E785 Hyperlipidemia, unspecified: Secondary | ICD-10-CM | POA: Insufficient documentation

## 2011-07-16 LAB — URINALYSIS, ROUTINE W REFLEX MICROSCOPIC
Glucose, UA: NEGATIVE mg/dL
Hgb urine dipstick: NEGATIVE
Protein, ur: NEGATIVE mg/dL

## 2011-07-16 LAB — DIFFERENTIAL
Basophils Absolute: 0 10*3/uL (ref 0.0–0.1)
Lymphocytes Relative: 17 % (ref 12–46)
Neutro Abs: 10.1 10*3/uL — ABNORMAL HIGH (ref 1.7–7.7)

## 2011-07-16 LAB — CBC
Platelets: 226 10*3/uL (ref 150–400)
RDW: 14.2 % (ref 11.5–15.5)
WBC: 13.7 10*3/uL — ABNORMAL HIGH (ref 4.0–10.5)

## 2011-07-16 LAB — COMPREHENSIVE METABOLIC PANEL
ALT: 10 U/L (ref 0–53)
AST: 11 U/L (ref 0–37)
CO2: 26 mEq/L (ref 19–32)
Chloride: 101 mEq/L (ref 96–112)
GFR calc non Af Amer: 47 mL/min — ABNORMAL LOW (ref >90)
Sodium: 138 mEq/L (ref 135–145)
Total Bilirubin: 0.4 mg/dL (ref 0.3–1.2)

## 2011-07-16 MED ORDER — IOHEXOL 300 MG/ML  SOLN
80.0000 mL | Freq: Once | INTRAMUSCULAR | Status: AC | PRN
  Administered 2011-07-16: 80 mL via INTRAVENOUS

## 2011-07-16 MED ORDER — DISPOSABLE ENEMA 19-7 GM/118ML RE ENEM: 1.0000 | ENEMA | Freq: Once | RECTAL | Status: AC

## 2011-07-16 MED ORDER — ONDANSETRON HCL 4 MG PO TABS: 8.0000 mg | ORAL_TABLET | Freq: Four times a day (QID) | ORAL | Status: AC

## 2011-07-16 MED ORDER — ONDANSETRON HCL 4 MG/2ML IJ SOLN
4.0000 mg | Freq: Once | INTRAMUSCULAR | Status: AC
  Administered 2011-07-16: 4 mg via INTRAVENOUS
  Filled 2011-07-16: qty 2

## 2011-07-16 MED ORDER — SODIUM CHLORIDE 0.9 % IV SOLN
999.0000 mL | INTRAVENOUS | Status: DC
  Administered 2011-07-16: 1000 mL via INTRAVENOUS

## 2011-07-16 MED ORDER — HYDROMORPHONE HCL PF 1 MG/ML IJ SOLN
1.0000 mg | Freq: Once | INTRAMUSCULAR | Status: AC
  Administered 2011-07-16: 1 mg via INTRAVENOUS
  Filled 2011-07-16: qty 1

## 2011-07-16 MED ORDER — LACTULOSE 20 GM/30ML PO SOLN: 30.0000 mL | Freq: Two times a day (BID) | ORAL | Status: DC

## 2011-07-16 NOTE — ED Notes (Signed)
A&ox4 in no distress; instructions reviewed-verbalizes understanding.

## 2011-07-16 NOTE — ED Notes (Signed)
Patient states he had back surgery 5 weeks ago and has been constipated off and on since then. Complaining of severe abdominal pain. Last bowel movement was 2 days ago. Patient is taking hydrocodone for back pain since surgery.

## 2011-07-16 NOTE — ED Provider Notes (Signed)
History     CSN: [id number] Arrival date & time: 07/16/2011  3:14 AM   First MD Initiated Contact with Patient 07/16/11 [phone]      Chief Complaint  Patient presents with  . Abdominal Pain  . Constipation    (Consider location/radiation/quality/duration/timing/severity/associated sxs/prior treatment) Patient is a 70 y.o. male presenting with abdominal pain and constipation. The history is provided by the patient.  Abdominal Pain The primary symptoms of the illness include abdominal pain. The primary symptoms of the illness do not include vomiting. The current episode started yesterday. The onset of the illness was gradual.  Additional symptoms associated with the illness include constipation. Symptoms associated with the illness do not include chills.  Constipation  Associated symptoms include abdominal pain. Pertinent negatives include no vomiting.  Pt has been having trouble with constipation since surgery/  However tonight the pain was much more severe.  He has been unable to pass gas as well.  Pt has had prior abdominal surgery as well as a bowel obstruction.  Pt last BM was 2 days ago.  Pain is in the lower abdomen.  Past Medical History  Diagnosis Date  . HTN (hypertension)   . Hyperlipidemia   . Coronary artery disease     IN 2000   STENT PLACED IN 2012  . Hx of cardiac cath     STENT PLACED IN 2012  . Small bowel problem     HAD ALOT OF SCAR TISSUE FROM PREVIOUS SURGERIES..NG WAS INSERTED ...Marland KitchenMarland KitchenNO SURGERY NEEDED...Marland KitchenMarland KitchenIN FOR 8 DAYS  . Disorder of blood     BEEN TREATED BY DERMATOLOGIST X 4 YRS..."BLOOD BLISTERS"    Past Surgical History  Procedure Date  . Right shoulder dr Romeo Apple   . Coronary stent placement   . Coronary artery bypass graft   . Sternal surg     HAS HAD 5-6 ON HIS STERNUM  . Coronary angioplasty   . Cataracts     BILATERAL  . Tonsillectomy   . Cholecystectomy   . Lumbar laminectomy/decompression microdiscectomy 06/13/2011    Procedure: LUMBAR  LAMINECTOMY/DECOMPRESSION MICRODISCECTOMY;  Surgeon: Karn Cassis;  Location: [facility];  Service: Neurosurgery;  Laterality: N/A;  Lumbar three, lumbar four-five Laminectomy    Family History  Problem Relation Age of Onset  . Heart disease    . Hypertension Mother   . Hypertension Maternal Aunt   . Hypertension Maternal Uncle     History  Substance Use Topics  . Smoking status: Former Smoker -- 1.0 packs/day for 1 years    Types: Cigarettes    Quit date: 12/27/1998  . Smokeless tobacco: Not on file  . Alcohol Use: No      Review of Systems  Constitutional: Negative for chills.  Gastrointestinal: Positive for abdominal pain and constipation. Negative for vomiting.    Allergies  Metoprolol and Morphine  Home Medications   Current Outpatient Rx  Name Route Sig Dispense Refill  . ASPIRIN 81 MG PO TABS Oral Take 81 mg by mouth daily.      . ATORVASTATIN CALCIUM 40 MG PO TABS Oral Take 20 mg by mouth daily.      Marland Kitchen CITALOPRAM HYDROBROMIDE 20 MG PO TABS Oral Take 20 mg by mouth daily.     Marland Kitchen DOXAZOSIN MESYLATE 8 MG PO TABS Oral Take 8 mg by mouth at bedtime.     Marland Kitchen NITROGLYCERIN 0.4 MG SL SUBL Sublingual Place 1 tablet (0.4 mg total) under the tongue every 5 (five) minutes as needed.  25 tablet 3  . PREDNISONE 20 MG PO TABS Oral Take 20 mg by mouth daily.       BP 94/67  Pulse 107  Temp(Src) 97.8 F (36.6 C) (Oral)  Resp 18  Ht 6' (1.829 m)  Wt 212 lb (96.163 kg)  BMI 28.75 kg/m2  SpO2 97%  Physical Exam  Nursing note and vitals reviewed. Constitutional: He appears well-developed and well-nourished. No distress.  HENT:  Head: Normocephalic and atraumatic.  Right Ear: External ear normal.  Left Ear: External ear normal.  Eyes: Conjunctivae are normal. Right eye exhibits no discharge. Left eye exhibits no discharge. No scleral icterus.  Neck: Neck supple. No tracheal deviation present.  Cardiovascular: Normal rate, regular rhythm and intact distal pulses.     Pulmonary/Chest: Effort normal and breath sounds normal. No stridor. No respiratory distress. He has no wheezes. He has no rales.  Abdominal: Soft. Bowel sounds are normal. He exhibits no distension, no pulsatile midline mass and no mass. There is no hepatosplenomegaly. There is tenderness in the right lower quadrant and left lower quadrant. There is no rebound and no guarding. No hernia.  Musculoskeletal: He exhibits no edema and no tenderness.  Neurological: He is alert. He has normal strength. No sensory deficit. Cranial nerve deficit:  no gross defecits noted. He exhibits normal muscle tone. He displays no seizure activity. Coordination normal.  Skin: Skin is warm and dry. No rash noted.  Psychiatric: He has a normal mood and affect.    ED Course  Procedures (including critical care time)  Labs Reviewed  CBC - Abnormal; Notable for the following:    WBC 13.7 (*)    RBC 3.68 (*)    Hemoglobin 11.0 (*)    HCT 32.9 (*)    All other components within normal limits  DIFFERENTIAL - Abnormal; Notable for the following:    Neutro Abs 10.1 (*)    Monocytes Absolute 1.1 (*)    All other components within normal limits  COMPREHENSIVE METABOLIC PANEL - Abnormal; Notable for the following:    Glucose, Bld 115 (*)    BUN 26 (*)    Creatinine, Ser 1.48 (*)    GFR calc non Af Amer 47 (*)    GFR calc Af Amer 54 (*)    All other components within normal limits  LIPASE, BLOOD  URINALYSIS, ROUTINE W REFLEX MICROSCOPIC   Dg Abd Acute W/chest  07/16/2011  *RADIOLOGY REPORT*  Clinical Data: Lower abdominal pain.  ACUTE ABDOMEN SERIES (ABDOMEN 2 VIEW & CHEST 1 VIEW)  Comparison: 10/17/2010 CT  Findings: Surgical clips project over the mediastinum, in keeping with prior CABG.  Calcified mediastinal lymph node and right lower lung nodule.  Small right pleural effusion.  Surgical clips right axilla.  No pneumothorax.  Surgical clips right upper quadrant.  Coils from prior mesh repair project over the  abdomen.  The bowel gas pattern is nonobstructive. No free intraperitoneal air identified.  Multilevel degenerative changes.  No definite acute osseous abnormality.  IMPRESSION: Small right pleural effusion.  Nonobstructive bowel gas pattern.  Original Report Authenticated By: Waneta Martins, M.D.     Dx: Abdominal pain, constipation   MDM  Pt is feeling better at this time but with his history of prior bowel obstruction, will CT for further evaluation.  5:45 AM   7:40 AM  Awaiting formal CT reading.           Celene Kras, MD 07/16/11 [phone]

## 2011-07-16 NOTE — ED Notes (Signed)
Patient to CT via wheelchair. Denies any pain. No distress.

## 2011-07-16 NOTE — ED Notes (Signed)
Patient report given to this nurse. Assuming care of patient.  

## 2011-07-16 NOTE — ED Notes (Signed)
Into room to see patient. Resting sitting up in bed. Denies pain or nausea at this time. Denies being able to pass urine for specimen. Denies any needs. Call bell within reach. Normal saline infusing well with no signs of infiltration. Bed in low position and locked with side rails up.

## 2011-07-16 NOTE — ED Notes (Signed)
Pt reports not having BM since Monday.  Recently been taking pain medication following back surgery a month ago.  Pt states that he has passed small amounts of gas.  Reports he has a previous history of bowel obstructions, and states the abdominal pain he is presently experiencing feels "exactly the same way."

## 2011-07-16 NOTE — ED Notes (Signed)
Pt reports he is now passing gas and continues to deny pain and nausea.

## 2011-07-16 NOTE — ED Notes (Addendum)
MD at bedside. Remains pain free at this time. Denies nausea. Denies being able to given urine specimen. Normal saline infusing well with no signs of infiltration. Call bell at bedside. Will continue to monitor.

## 2011-07-16 NOTE — ED Provider Notes (Addendum)
History     CSN: [id number] Arrival date & time: 07/16/2011  3:14 AM   First MD Initiated Contact with Patient 07/16/11 [phone]      Chief Complaint  Patient presents with  . Abdominal Pain  . Constipation    (Consider location/radiation/quality/duration/timing/severity/associated sxs/prior treatment) HPI  Past Medical History  Diagnosis Date  . HTN (hypertension)   . Hyperlipidemia   . Coronary artery disease     IN 2000   STENT PLACED IN 2012  . Hx of cardiac cath     STENT PLACED IN 2012  . Small bowel problem     HAD ALOT OF SCAR TISSUE FROM PREVIOUS SURGERIES..NG WAS INSERTED ...Marland KitchenMarland KitchenNO SURGERY NEEDED...Marland KitchenMarland KitchenIN FOR 8 DAYS  . Disorder of blood     BEEN TREATED BY DERMATOLOGIST X 4 YRS..."BLOOD BLISTERS"    Past Surgical History  Procedure Date  . Right shoulder dr Romeo Apple   . Coronary stent placement   . Coronary artery bypass graft   . Sternal surg     HAS HAD 5-6 ON HIS STERNUM  . Coronary angioplasty   . Cataracts     BILATERAL  . Tonsillectomy   . Cholecystectomy   . Lumbar laminectomy/decompression microdiscectomy 06/13/2011    Procedure: LUMBAR LAMINECTOMY/DECOMPRESSION MICRODISCECTOMY;  Surgeon: Karn Cassis;  Location: [facility];  Service: Neurosurgery;  Laterality: N/A;  Lumbar three, lumbar four-five Laminectomy    Family History  Problem Relation Age of Onset  . Heart disease    . Hypertension Mother   . Hypertension Maternal Aunt   . Hypertension Maternal Uncle     History  Substance Use Topics  . Smoking status: Former Smoker -- 1.0 packs/day for 1 years    Types: Cigarettes    Quit date: 12/27/1998  . Smokeless tobacco: Not on file  . Alcohol Use: No      Review of Systems  Allergies  Metoprolol and Morphine  Home Medications   Current Outpatient Rx  Name Route Sig Dispense Refill  . ASPIRIN 81 MG PO TABS Oral Take 81 mg by mouth daily.      . ATORVASTATIN CALCIUM 40 MG PO TABS Oral Take 20 mg by mouth daily.      Marland Kitchen  CITALOPRAM HYDROBROMIDE 20 MG PO TABS Oral Take 20 mg by mouth daily.     Marland Kitchen DOXAZOSIN MESYLATE 8 MG PO TABS Oral Take 8 mg by mouth at bedtime.     Marland Kitchen LACTULOSE 20 GM/30ML PO SOLN Oral Take 30 mLs (20 g total) by mouth 2 (two) times daily. For constipation 200 mL 0  . NITROGLYCERIN 0.4 MG SL SUBL Sublingual Place 1 tablet (0.4 mg total) under the tongue every 5 (five) minutes as needed. 25 tablet 3  . PREDNISONE 20 MG PO TABS Oral Take 20 mg by mouth daily.     . DISPOSABLE ENEMA 19-7 GM/118ML RE ENEM Rectal Place 1 enema rectally once. follow package directions 135 mL 0    BP 150/65  Pulse 61  Temp(Src) 97.8 F (36.6 C) (Oral)  Resp 18  Ht 6' (1.829 m)  Wt 212 lb (96.163 kg)  BMI 28.75 kg/m2  SpO2 100%  Physical Exam  ED Course  Procedures (including critical care time)  Labs Reviewed  CBC - Abnormal; Notable for the following:    WBC 13.7 (*)    RBC 3.68 (*)    Hemoglobin 11.0 (*)    HCT 32.9 (*)    All other components within normal limits  DIFFERENTIAL - Abnormal; Notable for the following:    Neutro Abs 10.1 (*)    Monocytes Absolute 1.1 (*)    All other components within normal limits  COMPREHENSIVE METABOLIC PANEL - Abnormal; Notable for the following:    Glucose, Bld 115 (*)    BUN 26 (*)    Creatinine, Ser 1.48 (*)    GFR calc non Af Amer 47 (*)    GFR calc Af Amer 54 (*)    All other components within normal limits  LIPASE, BLOOD  URINALYSIS, ROUTINE W REFLEX MICROSCOPIC   Ct Abdomen Pelvis W Contrast  07/16/2011  *RADIOLOGY REPORT*  Clinical Data: Abdominal pain, vomiting and constipation.  Status post lumbar surgery 5 weeks ago.  Elevated white blood cell count.  CT ABDOMEN AND PELVIS WITH CONTRAST  Technique:  Multidetector CT imaging of the abdomen and pelvis was performed following the standard protocol during bolus administration of intravenous contrast.  Contrast: 80mL OMNIPAQUE IOHEXOL 300 MG/ML IV SOLN 40 ml Omnipaque- 300.  Comparison: CT abdomen and  pelvis 10/17/2010.  Findings: There is no pleural or pericardial effusion.  Lung bases demonstrate only some mild atelectasis or scar on the right.  A few scattered calcifications are seen in the liver.  The liver is otherwise unremarkable.  Multiple calcifications in the spleen are noted.  The patient is status post cholecystectomy.  The adrenal glands, pancreas and kidneys appear normal. Previously seen possible stone in the lower pole of the right kidney is no longer visualized.  The patient is status post prior ventral hernia repair.  Several loops of small bowel appear adhesed to the anterior abdominal wall with stool material within them consistent with slow transit.  Some proximal small bowel loops are mildly dilated 3.5 cm.  Distal loops are completely decompressed.  There is some gas and stool in the colon.  Bones demonstrate postoperative change of laminectomy at L3-4 and L4-5.  There is some fluid of laminectomy bed likely representing seroma.  No bony destructive change or fluid the disc space is identified.  IMPRESSION:  1.  Findings compatible with early partial small bowel obstruction. Transition point is at small bowel loops adhesed to the anterior abdominal wall in this patient status post prior ventral hernia repair. 2.  Status post cholecystectomy. 3.  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 3.  Status post L3-4 L4-5 laminectomy.  Original Report Authenticated By: Bernadene Bell. D'ALESSIO, M.D.   Dg Abd Acute W/chest  07/16/2011  *RADIOLOGY REPORT*  Clinical Data: Lower abdominal pain.  ACUTE ABDOMEN SERIES (ABDOMEN 2 VIEW & CHEST 1 VIEW)  Comparison: 10/17/2010 CT  Findings: Surgical clips project over the mediastinum, in keeping with prior CABG.  Calcified mediastinal lymph node and right lower lung nodule.  Small right pleural effusion.  Surgical clips right axilla.  No pneumothorax.  Surgical clips right upper quadrant.  Coils from prior mesh repair  project over the abdomen.  The bowel gas pattern is nonobstructive. No free intraperitoneal air identified.  Multilevel degenerative changes.  No definite acute osseous abnormality.  IMPRESSION: Small right pleural effusion.  Nonobstructive bowel gas pattern.  Original Report Authenticated By: Waneta Martins, M.D.   Ct Abdomen Pelvis W Contrast  07/16/2011  *RADIOLOGY REPORT*  Clinical Data: Abdominal pain, vomiting and constipation.  Status post lumbar surgery 5 weeks ago.  Elevated white blood cell count.  CT ABDOMEN AND PELVIS WITH CONTRAST  Technique:  Multidetector CT imaging of the abdomen and  pelvis was performed following the standard protocol during bolus administration of intravenous contrast.  Contrast: 80mL OMNIPAQUE IOHEXOL 300 MG/ML IV SOLN 40 ml Omnipaque- 300.  Comparison: CT abdomen and pelvis 10/17/2010.  Findings: There is no pleural or pericardial effusion.  Lung bases demonstrate only some mild atelectasis or scar on the right.  A few scattered calcifications are seen in the liver.  The liver is otherwise unremarkable.  Multiple calcifications in the spleen are noted.  The patient is status post cholecystectomy.  The adrenal glands, pancreas and kidneys appear normal. Previously seen possible stone in the lower pole of the right kidney is no longer visualized.  The patient is status post prior ventral hernia repair.  Several loops of small bowel appear adhesed to the anterior abdominal wall with stool material within them consistent with slow transit.  Some proximal small bowel loops are mildly dilated 3.5 cm.  Distal loops are completely decompressed.  There is some gas and stool in the colon.  Bones demonstrate postoperative change of laminectomy at L3-4 and L4-5.  There is some fluid of laminectomy bed likely representing seroma.  No bony destructive change or fluid the disc space is identified.  IMPRESSION:  1.  Findings compatible with early partial small bowel obstruction.  Transition point is at small bowel loops adhesed to the anterior abdominal wall in this patient status post prior ventral hernia repair. 2.  Status post cholecystectomy. 3.  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 3.  Status post L3-4 L4-5 laminectomy.  Original Report Authenticated By: Bernadene Bell. D'ALESSIO, M.D.   Dg Abd Acute W/chest  07/16/2011  *RADIOLOGY REPORT*  Clinical Data: Lower abdominal pain.  ACUTE ABDOMEN SERIES (ABDOMEN 2 VIEW & CHEST 1 VIEW)  Comparison: 10/17/2010 CT  Findings: Surgical clips project over the mediastinum, in keeping with prior CABG.  Calcified mediastinal lymph node and right lower lung nodule.  Small right pleural effusion.  Surgical clips right axilla.  No pneumothorax.  Surgical clips right upper quadrant.  Coils from prior mesh repair project over the abdomen.  The bowel gas pattern is nonobstructive. No free intraperitoneal air identified.  Multilevel degenerative changes.  No definite acute osseous abnormality.  IMPRESSION: Small right pleural effusion.  Nonobstructive bowel gas pattern.  Original Report Authenticated By: Waneta Martins, M.D.    1. Abdominal  pain, other specified site   2. Constipation       MDM  CT scan findings reviewed with patient. He is now passing gas. He wants to go home and return if pain becomes worse. No acute abdomen at discharge.  Patient is hemodynamically stable        Donnetta Hutching, MD 07/16/11 1610  Donnetta Hutching, MD 07/16/11 [phone]

## 2011-07-16 NOTE — ED Notes (Signed)
Into room to see patient. Resting sitting up in bed. Denies pain or nausea. Denies being able to pass urine for specimen at this time. Normal saline bolus complete; iv saline locked with no signs of infiltration. Tolerating contrast well. Call bell at bedside. Bed in low position and locked with side rails up.

## 2011-09-08 ENCOUNTER — Encounter: Payer: Self-pay | Admitting: Orthopedic Surgery

## 2011-09-08 ENCOUNTER — Ambulatory Visit (INDEPENDENT_AMBULATORY_CARE_PROVIDER_SITE_OTHER): Payer: Medicare Other | Admitting: Orthopedic Surgery

## 2011-09-08 VITALS — BP 140/80 | Ht 72.0 in | Wt 209.0 lb

## 2011-09-08 DIAGNOSIS — M12519 Traumatic arthropathy, unspecified shoulder: Secondary | ICD-10-CM

## 2011-09-08 DIAGNOSIS — M75101 Unspecified rotator cuff tear or rupture of right shoulder, not specified as traumatic: Secondary | ICD-10-CM | POA: Insufficient documentation

## 2011-09-08 DIAGNOSIS — M12811 Other specific arthropathies, not elsewhere classified, right shoulder: Secondary | ICD-10-CM | POA: Insufficient documentation

## 2011-09-08 NOTE — Progress Notes (Signed)
Patient ID: [name], male   DOB: June 20, 1941, 71 y.o.   MRN: [id number]  X-ray report AP lateral RIGHT shoulder  RIGHT shoulder pain after a fall  The most striking feature is a proximal migration of the humerus with loss of humeral acromial space.  There is a break in the shoulder Shenton's line.  No inferior osteophytes are seen at this time  Impression proximal migration of the RIGHT humerus most likely secondary to rotator cuff deficiency. Subjective:    [name] is a 71 y.o. male who presents with pain in his RIGHT shoulder since December of 2012.  He is status post multiple procedures on the RIGHT shoulder including attempted repair of massive rotator cuff tear with complication of infection.  I sent him to a shoulder specialist who thought that he would need a reverse replacement in the future pending a workup for infection.  He's recently had back surgery and before the surgery and after the surgery he had a couple of falls.  Since that time he has noted decreased range of motion and increased pain in the RIGHT shoulder with pseudoparalysis of the RIGHT upper extremity   The following portions of the patient's history were reviewed and updated as appropriate: allergies, current medications, past family history, past medical history, past social history, past surgical history and problem list.  Review of Systems Hematologic/lymphatic: positive for easy bruising   Objective:    BP 140/80  Ht 6' (1.829 m)  Wt 209 lb (94.802 kg)  BMI 28.35 kg/m2  Physical Exam(12) GENERAL: normal development   CDV: pulses are normal   Skin: normal  Lymph: nodes were not palpable/normal  Psychiatric: awake, alert and oriented  Neuro: normal sensation   Right shoulder: active range of motion RIGHT shoulder abduction 0 external rotation 30 extension 30 flexion 30., passive range of motion shoulder abduction 70, flexion 80.  3/5 external rotation power is 0/5  supraspinatus.  5 over 5 internal rotation., also we have a well-healed incision with what feels to be a palpable defect in the deltoid middle and anterior head interval  Left shoulder:  is marked by decreased range of motion crepitance weakness no tenderness no swelling remains stable     Assessment:    Right rotator cuff deficiency    Plan:    I think at this point Mr. Fiallos is looking at a reversed shoulder replacement.  He understands we would send him to Dr.Landau.  Is not ready for surgery at this time he can tolerate the pain.  He indicates that he can still golf.

## 2011-09-08 NOTE — Patient Instructions (Signed)
Activities as tolerated. 

## 2011-10-20 ENCOUNTER — Encounter (HOSPITAL_COMMUNITY): Payer: Self-pay | Admitting: *Deleted

## 2011-10-20 ENCOUNTER — Emergency Department (HOSPITAL_COMMUNITY)
Admission: EM | Admit: 2011-10-20 | Discharge: 2011-10-21 | Disposition: A | Discharge status: Home / Self Care | Payer: Medicare Other | Attending: Emergency Medicine | Admitting: Emergency Medicine

## 2011-10-20 ENCOUNTER — Emergency Department (HOSPITAL_COMMUNITY): Payer: Medicare Other

## 2011-10-20 DIAGNOSIS — E785 Hyperlipidemia, unspecified: Secondary | ICD-10-CM | POA: Insufficient documentation

## 2011-10-20 DIAGNOSIS — Z7982 Long term (current) use of aspirin: Secondary | ICD-10-CM | POA: Insufficient documentation

## 2011-10-20 DIAGNOSIS — I2510 Atherosclerotic heart disease of native coronary artery without angina pectoris: Secondary | ICD-10-CM | POA: Insufficient documentation

## 2011-10-20 DIAGNOSIS — Z79899 Other long term (current) drug therapy: Secondary | ICD-10-CM | POA: Insufficient documentation

## 2011-10-20 DIAGNOSIS — M542 Cervicalgia: Secondary | ICD-10-CM | POA: Insufficient documentation

## 2011-10-20 DIAGNOSIS — S0101XA Laceration without foreign body of scalp, initial encounter: Secondary | ICD-10-CM

## 2011-10-20 DIAGNOSIS — R51 Headache: Secondary | ICD-10-CM | POA: Insufficient documentation

## 2011-10-20 DIAGNOSIS — I10 Essential (primary) hypertension: Secondary | ICD-10-CM | POA: Insufficient documentation

## 2011-10-20 DIAGNOSIS — W010XXA Fall on same level from slipping, tripping and stumbling without subsequent striking against object, initial encounter: Secondary | ICD-10-CM | POA: Insufficient documentation

## 2011-10-20 DIAGNOSIS — S0100XA Unspecified open wound of scalp, initial encounter: Secondary | ICD-10-CM | POA: Insufficient documentation

## 2011-10-20 DIAGNOSIS — S12600A Unspecified displaced fracture of seventh cervical vertebra, initial encounter for closed fracture: Secondary | ICD-10-CM | POA: Insufficient documentation

## 2011-10-20 DIAGNOSIS — S129XXA Fracture of neck, unspecified, initial encounter: Secondary | ICD-10-CM

## 2011-10-20 MED ORDER — HYDROCODONE-ACETAMINOPHEN 5-325 MG PO TABS
1.0000 | ORAL_TABLET | Freq: Once | ORAL | Status: AC
  Administered 2011-10-20: 1 via ORAL
  Filled 2011-10-20: qty 1

## 2011-10-20 NOTE — ED Notes (Signed)
Laceration to scalp, states he fell against a clock at home

## 2011-10-20 NOTE — ED Provider Notes (Signed)
History     CSN: [id number]  Arrival date & time 10/20/11  2137   First MD Initiated Contact with Patient 10/20/11 2303      Chief Complaint  Patient presents with  . Head Laceration    (Consider location/radiation/quality/duration/timing/severity/associated sxs/prior treatment) HPI Comments: Pt states he tripped and fell striking the top of his head on a wall clock.  He has localized pain around a scalp lacer and his neck is "sore".  Denies LOC.  Is on plavix.  Son is with him and has not observed any mental status changes.  Patient is a 71 y.o. male presenting with scalp laceration. The history is provided by the patient. No language interpreter was used.  Head Laceration This is a new problem. Episode onset: several hours ago. The problem has been unchanged. Associated symptoms include headaches and neck pain. The symptoms are aggravated by nothing. He has tried nothing for the symptoms.    Past Medical History  Diagnosis Date  . HTN (hypertension)   . Hyperlipidemia   . Coronary artery disease     IN 2000   STENT PLACED IN 2012  . Hx of cardiac cath     STENT PLACED IN 2012  . Small bowel problem     HAD ALOT OF SCAR TISSUE FROM PREVIOUS SURGERIES..NG WAS INSERTED ...Marland KitchenMarland KitchenNO SURGERY NEEDED...Marland KitchenMarland KitchenIN FOR 8 DAYS  . Disorder of blood     BEEN TREATED BY DERMATOLOGIST X 4 YRS..."BLOOD BLISTERS"    Past Surgical History  Procedure Date  . Right shoulder dr Romeo Apple   . Coronary stent placement   . Coronary artery bypass graft   . Sternal surg     HAS HAD 5-6 ON HIS STERNUM  . Coronary angioplasty   . Cataracts     BILATERAL  . Tonsillectomy   . Cholecystectomy   . Lumbar laminectomy/decompression microdiscectomy 06/13/2011    Procedure: LUMBAR LAMINECTOMY/DECOMPRESSION MICRODISCECTOMY;  Surgeon: Karn Cassis;  Location: [facility];  Service: Neurosurgery;  Laterality: N/A;  Lumbar three, lumbar four-five Laminectomy    Family History  Problem Relation Age of Onset    . Heart disease    . Hypertension Mother   . Hypertension Maternal Aunt   . Hypertension Maternal Uncle     History  Substance Use Topics  . Smoking status: Former Smoker -- 1.0 packs/day for 1 years    Types: Cigarettes    Quit date: 12/27/1998  . Smokeless tobacco: Not on file  . Alcohol Use: No      Review of Systems  HENT: Positive for neck pain. Negative for ear discharge.   Skin: Positive for wound.  Neurological: Positive for headaches.  Hematological: Bruises/bleeds easily.  All other systems reviewed and are negative.    Allergies  Metoprolol and Morphine  Home Medications   Current Outpatient Rx  Name Route Sig Dispense Refill  . ASPIRIN 81 MG PO TABS Oral Take 81 mg by mouth daily.      . ATORVASTATIN CALCIUM 40 MG PO TABS Oral Take 20 mg by mouth at bedtime.     Marland Kitchen CITALOPRAM HYDROBROMIDE 20 MG PO TABS Oral Take 20 mg by mouth daily.     Marland Kitchen DOXAZOSIN MESYLATE 8 MG PO TABS Oral Take 8 mg by mouth at bedtime.     Marland Kitchen PREDNISONE 20 MG PO TABS Oral Take 20 mg by mouth daily.     Marland Kitchen NITROGLYCERIN 0.4 MG SL SUBL Sublingual Place 1 tablet (0.4 mg total) under the tongue  every 5 (five) minutes as needed. 25 tablet 3    BP 140/72  Pulse 78  Temp 98 F (36.7 C)  Resp 16  Ht 6' (1.829 m)  Wt 205 lb (92.987 kg)  BMI 27.80 kg/m2  SpO2 98%  Physical Exam  Nursing note and vitals reviewed. Constitutional: He is oriented to person, place, and time. He appears well-developed and well-nourished.  HENT:  Head: Normocephalic.    Right Ear: Hearing, tympanic membrane, external ear and ear canal normal.  Left Ear: Hearing, tympanic membrane, external ear and ear canal normal.  Nose: Nose normal.  Mouth/Throat: Oropharynx is clear and moist.       Scalp laceration.  No active bleeding.  Eyes: EOM are normal. Pupils are equal, round, and reactive to light.  Neck: Trachea normal. Spinous process tenderness and muscular tenderness present. No rigidity. Decreased range  of motion present.    Cardiovascular: Normal rate, regular rhythm, normal heart sounds and intact distal pulses.   Pulmonary/Chest: Effort normal and breath sounds normal. No respiratory distress.  Abdominal: Soft. He exhibits no distension. There is no tenderness.  Musculoskeletal: He exhibits tenderness.  Neurological: He is alert and oriented to person, place, and time.  Skin: Skin is warm and dry.  Psychiatric: He has a normal mood and affect. His behavior is normal. Judgment and thought content normal.    ED Course  LACERATION REPAIR Date/Time: 10/21/2011 12:50 AM Performed by: Worthy Rancher Authorized by: Worthy Rancher Consent: Verbal consent obtained. Written consent not obtained. Risks and benefits: risks, benefits and alternatives were discussed Consent given by: patient Patient understanding: patient states understanding of the procedure being performed Imaging studies: imaging studies available Required items: required blood products, implants, devices, and special equipment available Patient identity confirmed: verbally with patient Time out: Immediately prior to procedure a "time out" was called to verify the correct patient, procedure, equipment, support staff and site/side marked as required. Body area: head/neck Location details: scalp Laceration length: 4 cm Foreign bodies: no foreign bodies Tendon involvement: none Nerve involvement: none Vascular damage: no Anesthesia: local infiltration Local anesthetic: lidocaine 1% with epinephrine Anesthetic total: 5 ml Patient sedated: no Preparation: Patient was prepped and draped in the usual sterile fashion. Irrigation solution: saline Irrigation method: syringe Amount of cleaning: standard Debridement: none Degree of undermining: none Skin closure: staples Number of sutures: 7 Technique: simple Approximation: close Approximation difficulty: simple Patient tolerance: Patient tolerated the procedure well  with no immediate complications.   (including critical care time)  Labs Reviewed - No data to display Ct Head Wo Contrast  10/21/2011  *RADIOLOGY REPORT*  Clinical Data:  Status post fall; hit top left side of head, with laceration to the head.  Concern for cervical spine injury.  CT HEAD WITHOUT CONTRAST AND CT CERVICAL SPINE WITHOUT CONTRAST  Technique:  Multidetector CT imaging of the head and cervical spine was performed following the standard protocol without intravenous contrast.  Multiplanar CT image reconstructions of the cervical spine were also generated.  Comparison: None  CT HEAD  Findings: There is no evidence of acute infarction, mass lesion, or intra- or extra-axial hemorrhage on CT.  The posterior fossa, including the cerebellum, brainstem and fourth ventricle, is within normal limits.  The third and lateral ventricles, and basal ganglia are unremarkable in appearance.  The cerebral hemispheres are symmetric in appearance, with normal gray- white differentiation.  No mass effect or midline shift is seen.  There is no evidence of fracture; visualized osseous  structures are unremarkable in appearance.  The visualized portions of the orbits are within normal limits.  Mild mucosal thickening is noted within the left maxillary sinus; the remaining paranasal sinuses and mastoid air cells are well-aerated.  A soft tissue laceration is noted on the left side of the vertex.  IMPRESSION:  1.  No evidence of traumatic intracranial injury or fracture. 2.  Soft tissue laceration on the left side of the vertex. 3.  Mild mucosal thickening within the left maxillary sinus.  CT CERVICAL SPINE  Findings: There is an acute compression fracture of vertebral body C7, with multiple fracture lines extending across the vertebral body, and nearly 50% loss of body height.  2-3 mm of retropulsion is noted.  There is no definite evidence of extension of the fracture lines to the posterior elements.  No additional  fractures are seen; there is no evidence of acute subluxation.  Intervertebral disc spaces are preserved. Prevertebral soft tissues are within normal limits.  Degenerative change is noted about the dens.  The thyroid gland is unremarkable in appearance.  The visualized lung apices are clear.  No significant soft tissue abnormalities are seen.  IMPRESSION:  1.  Acute compression fracture of vertebral body C7, with multiple fracture lines extending across the vertebral body, nearly 50% loss of body height, and 2-3 mm of retropulsion. 2.  No additional fractures identified along the cervical spine.  These results were called by telephone on 10/21/2011  at  12:20 a.m. to  Dr. Bethann Berkshire, who verbally acknowledged these results.  Original Report Authenticated By: Tonia Ghent, M.D.   Ct Cervical Spine Wo Contrast  10/21/2011  *RADIOLOGY REPORT*  Clinical Data:  Status post fall; hit top left side of head, with laceration to the head.  Concern for cervical spine injury.  CT HEAD WITHOUT CONTRAST AND CT CERVICAL SPINE WITHOUT CONTRAST  Technique:  Multidetector CT imaging of the head and cervical spine was performed following the standard protocol without intravenous contrast.  Multiplanar CT image reconstructions of the cervical spine were also generated.  Comparison: None  CT HEAD  Findings: There is no evidence of acute infarction, mass lesion, or intra- or extra-axial hemorrhage on CT.  The posterior fossa, including the cerebellum, brainstem and fourth ventricle, is within normal limits.  The third and lateral ventricles, and basal ganglia are unremarkable in appearance.  The cerebral hemispheres are symmetric in appearance, with normal gray- white differentiation.  No mass effect or midline shift is seen.  There is no evidence of fracture; visualized osseous structures are unremarkable in appearance.  The visualized portions of the orbits are within normal limits.  Mild mucosal thickening is noted within the  left maxillary sinus; the remaining paranasal sinuses and mastoid air cells are well-aerated.  A soft tissue laceration is noted on the left side of the vertex.  IMPRESSION:  1.  No evidence of traumatic intracranial injury or fracture. 2.  Soft tissue laceration on the left side of the vertex. 3.  Mild mucosal thickening within the left maxillary sinus.  CT CERVICAL SPINE  Findings: There is an acute compression fracture of vertebral body C7, with multiple fracture lines extending across the vertebral body, and nearly 50% loss of body height.  2-3 mm of retropulsion is noted.  There is no definite evidence of extension of the fracture lines to the posterior elements.  No additional fractures are seen; there is no evidence of acute subluxation.  Intervertebral disc spaces are preserved. Prevertebral soft tissues  are within normal limits.  Degenerative change is noted about the dens.  The thyroid gland is unremarkable in appearance.  The visualized lung apices are clear.  No significant soft tissue abnormalities are seen.  IMPRESSION:  1.  Acute compression fracture of vertebral body C7, with multiple fracture lines extending across the vertebral body, nearly 50% loss of body height, and 2-3 mm of retropulsion. 2.  No additional fractures identified along the cervical spine.  These results were called by telephone on 10/21/2011  at  12:20 a.m. to  Dr. Bethann Berkshire, who verbally acknowledged these results.  Original Report Authenticated By: Tonia Ghent, M.D.     1. Scalp laceration   2. Cervical vertebral closed fracture     Discussed C7 compression fx findings from CT with dr. Lovell Sheehan.  Requests [place] J or aspen c-collar.  Will see in office tomorrow afternoon.  MDM  Wash wound BID/abx oint Staple removal in 1 week.  Dr. Lovell Sheehan will see you in his office tomorrow afternoon.wear the cervical collar.  Take your hydrocodone prn        Worthy Rancher, [place] 10/21/11 0106  Worthy Rancher,  [place] 10/21/11 [phone]

## 2011-10-21 ENCOUNTER — Ambulatory Visit (HOSPITAL_COMMUNITY)
Admission: RE | Admit: 2011-10-21 | Discharge: 2011-10-21 | Disposition: A | Discharge status: Home / Self Care | Payer: Medicare Other | Source: Ambulatory Visit | Attending: Neurosurgery | Admitting: Neurosurgery

## 2011-10-21 ENCOUNTER — Other Ambulatory Visit (HOSPITAL_COMMUNITY): Payer: Self-pay | Admitting: Neurosurgery

## 2011-10-21 DIAGNOSIS — S129XXA Fracture of neck, unspecified, initial encounter: Secondary | ICD-10-CM

## 2011-10-21 MED ORDER — LIDOCAINE-EPINEPHRINE (PF) 2 %-1:200000 IJ SOLN
INTRAMUSCULAR | Status: AC
  Filled 2011-10-21: qty 20

## 2011-10-21 MED ORDER — BACITRACIN ZINC 500 UNIT/GM EX OINT
TOPICAL_OINTMENT | CUTANEOUS | Status: AC
  Filled 2011-10-21: qty 0.9

## 2011-10-21 NOTE — ED Notes (Signed)
Patient ambulates without difficulty - states he is driving himself home - encouraged to let us call family he said no -- he was fine and did not have far to go.   Will call me when he gets to his home to let me know he arrived safely.

## 2011-10-21 NOTE — ED Notes (Signed)
Patient called and informed secretary he had arrived home safely.

## 2011-10-21 NOTE — ED Notes (Signed)
States pain medication has helped a little but still has pain in top of head at laceration and also c/o pain in his neck.

## 2011-10-21 NOTE — Discharge Instructions (Signed)
Laceration Care, Adult A laceration is a cut or lesion that goes through all layers of the skin and into the tissue just beneath the skin. TREATMENT  Some lacerations may not require closure. Some lacerations may not be able to be closed due to an increased risk of infection. It is important to see your caregiver as soon as possible after an injury to minimize the risk of infection and maximize the opportunity for successful closure. If closure is appropriate, pain medicines may be given, if needed. The wound will be cleaned to help prevent infection. Your caregiver will use stitches (sutures), staples, wound glue (adhesive), or skin adhesive strips to repair the laceration. These tools bring the skin edges together to allow for faster healing and a better cosmetic outcome. However, all wounds will heal with a scar. Once the wound has healed, scarring can be minimized by covering the wound with sunscreen during the day for 1 full year. HOME CARE INSTRUCTIONS  For sutures or staples:  Keep the wound clean and dry.   If you were given a bandage (dressing), you should change it at least once a day. Also, change the dressing if it becomes wet or dirty, or as directed by your caregiver.   Wash the wound with soap and water 2 times a day. Rinse the wound off with water to remove all soap. Pat the wound dry with a clean towel.   After cleaning, apply a thin layer of the antibiotic ointment as recommended by your caregiver. This will help prevent infection and keep the dressing from sticking.   You may shower as usual after the first 24 hours. Do not soak the wound in water until the sutures are removed.   Only take over-the-counter or prescription medicines for pain, discomfort, or fever as directed by your caregiver.   Get your sutures or staples removed as directed by your caregiver.  For skin adhesive strips:  Keep the wound clean and dry.   Do not get the skin adhesive strips wet. You may bathe  carefully, using caution to keep the wound dry.   If the wound gets wet, pat it dry with a clean towel.   Skin adhesive strips will fall off on their own. You may trim the strips as the wound heals. Do not remove skin adhesive strips that are still stuck to the wound. They will fall off in time.  For wound adhesive:  You may briefly wet your wound in the shower or bath. Do not soak or scrub the wound. Do not swim. Avoid periods of heavy perspiration until the skin adhesive has fallen off on its own. After showering or bathing, gently pat the wound dry with a clean towel.   Do not apply liquid medicine, cream medicine, or ointment medicine to your wound while the skin adhesive is in place. This may loosen the film before your wound is healed.   If a dressing is placed over the wound, be careful not to apply tape directly over the skin adhesive. This may cause the adhesive to be pulled off before the wound is healed.   Avoid prolonged exposure to sunlight or tanning lamps while the skin adhesive is in place. Exposure to ultraviolet light in the first year will darken the scar.   The skin adhesive will usually remain in place for 5 to 10 days, then naturally fall off the skin. Do not pick at the adhesive film.  You may need a tetanus shot if:  You   cannot remember when you had your last tetanus shot.   You have never had a tetanus shot.  If you get a tetanus shot, your arm may swell, get red, and feel warm to the touch. This is common and not a problem. If you need a tetanus shot and you choose not to have one, there is a rare chance of getting tetanus. Sickness from tetanus can be serious. SEEK MEDICAL CARE IF:   You have redness, swelling, or increasing pain in the wound.   You see a red line that goes away from the wound.   You have yellowish-white fluid (pus) coming from the wound.   You have a fever.   You notice a bad smell coming from the wound or dressing.   Your wound breaks  open before or after sutures have been removed.   You notice something coming out of the wound such as wood or glass.   Your wound is on your hand or foot and you cannot move a finger or toe.  SEEK IMMEDIATE MEDICAL CARE IF:   Your pain is not controlled with prescribed medicine.   You have severe swelling around the wound causing pain and numbness or a change in color in your arm, hand, leg, or foot.   Your wound splits open and starts bleeding.   You have worsening numbness, weakness, or loss of function of any joint around or beyond the wound.   You develop painful lumps near the wound or on the skin anywhere on your body.  MAKE SURE YOU:   Understand these instructions.   Will watch your condition.   Will get help right away if you are not doing well or get worse.  Document Released: 07/14/2005 Document Revised: 07/03/2011 Document Reviewed: 01/07/2011 [facility] Patient Information 2012 [place], [place].Cryotherapy Cryotherapy means treatment with cold. Ice or gel packs can be used to reduce both pain and swelling. Ice is the most helpful within the first 24 to 48 hours after an injury or flareup from overusing a muscle or joint. Sprains, strains, spasms, burning pain, shooting pain, and aches can all be eased with ice. Ice can also be used when recovering from surgery. Ice is effective, has very few side effects, and is safe for most people to use. PRECAUTIONS  Ice is not a safe treatment option for people with:  Raynaud's phenomenon. This is a condition affecting small blood vessels in the extremities. Exposure to cold may cause your problems to return.   Cold hypersensitivity. There are many forms of cold hypersensitivity, including:   Cold urticaria. Red, itchy hives appear on the skin when the tissues begin to warm after being iced.   Cold erythema. This is a red, itchy rash caused by exposure to cold.   Cold hemoglobinuria. Red blood cells break down when the tissues  begin to warm after being iced. The hemoglobin that carry oxygen are passed into the urine because they cannot combine with blood proteins fast enough.   Numbness or altered sensitivity in the area being iced.  If you have any of the following conditions, do not use ice until you have discussed cryotherapy with your caregiver:  Heart conditions, such as arrhythmia, angina, or chronic heart disease.   High blood pressure.   Healing wounds or open skin in the area being iced.   Current infections.   Rheumatoid arthritis.   Poor circulation.   Diabetes.  Ice slows the blood flow in the region it is applied. This is  beneficial when trying to stop inflamed tissues from spreading irritating chemicals to surrounding tissues. However, if you expose your skin to cold temperatures for too long or without the proper protection, you can damage your skin or nerves. Watch for signs of skin damage due to cold. HOME CARE INSTRUCTIONS Follow these tips to use ice and cold packs safely.  Place a dry or damp towel between the ice and skin. A damp towel will cool the skin more quickly, so you may need to shorten the time that the ice is used.   For a more rapid response, add gentle compression to the ice.   Ice for no more than 10 to 20 minutes at a time. The bonier the area you are icing, the less time it will take to get the benefits of ice.   Check your skin after 5 minutes to make sure there are no signs of a poor response to cold or skin damage.   Rest 20 minutes or more in between uses.   Once your skin is numb, you can end your treatment. You can test numbness by very lightly touching your skin. The touch should be so light that you do not see the skin dimple from the pressure of your fingertip. When using ice, most people will feel these normal sensations in this order: cold, burning, aching, and numbness.   Do not use ice on someone who cannot communicate their responses to pain, such as small  children or people with dementia.  HOW TO MAKE AN ICE PACK Ice packs are the most common way to use ice therapy. Other methods include ice massage, ice baths, and cryo-sprays. Muscle creams that cause a cold, tingly feeling do not offer the same benefits that ice offers and should not be used as a substitute unless recommended by your caregiver. To make an ice pack, do one of the following:  Place crushed ice or a bag of frozen vegetables in a sealable plastic bag. Squeeze out the excess air. Place this bag inside another plastic bag. Slide the bag into a pillowcase or place a damp towel between your skin and the bag.   Mix 3 parts water with 1 part rubbing alcohol. Freeze the mixture in a sealable plastic bag. When you remove the mixture from the freezer, it will be slushy. Squeeze out the excess air. Place this bag inside another plastic bag. Slide the bag into a pillowcase or place a damp towel between your skin and the bag.  SEEK MEDICAL CARE IF:  You develop white spots on your skin. This may give the skin a blotchy (mottled) appearance.   Your skin turns blue or pale.   Your skin becomes waxy or hard.   Your swelling gets worse.  MAKE SURE YOU:   Understand these instructions.   Will watch your condition.   Will get help right away if you are not doing well or get worse.  Document Released: 03/10/2011 Document Revised: 07/03/2011 Document Reviewed: 03/10/2011 [facility] Patient Information 2012 [place], [place].Vertebral Fracture You have a fracture of one or more vertebra. These are the bony parts that form the spine. Minor vertebral fractures happen when people fall. Osteoporosis is associated with many of these fractures. Hospital care may not be necessary for minor compression fractures that are stable. However, multiple fractures of the spine or unstable injuries can cause severe pain and even damage the spinal cord. A spinal cord injury may cause paralysis, numbness, or loss of  normal bowel  and bladder control.  Normally there is pain and stiffness in the back for 3 to 6 weeks after a vertebral fracture. Bed rest for several days, pain medicine, and a slow return to activity is often the only treatment that is needed depending on the location of the fracture. Neck and back braces may be helpful in reducing pain and increasing mobility. When your pain allows, you should begin walking or swimming to help maintain your endurance. Exercises to improve motion and to strengthen the back may also be useful after the initial pain improves. Treatment for osteoporosis may be essential for full recovery. This will help reduce your risk of vertebral fractures with a future fall. During the first few days after a spine fracture you may feel nauseated or vomit. If this is severe, hospital care with IV fluids will be needed.  Arrange for follow-up care as recommended to assure proper long-term care and prevention of further spine injury.  SEEK IMMEDIATE MEDICAL CARE IF:  You have increasing pain, vomiting, or are unable to move around at all.   You develop numbness, tingling, weakness, or paralysis of any part of your body.   You develop a loss of normal bowel or bladder control.   You have difficulty breathing, cough, fever, chest or abdominal pain.  MAKE SURE YOU:   Understand these instructions.   Will watch your condition.   Will get help right away if you are not doing well or get worse.  Document Released: 08/21/2004 Document Revised: 07/03/2011 Document Reviewed: 03/06/2009 [facility] Patient Information 2012 [place], [place].  Wear the cervical collar at all times until evaluated further by dr. Lovell Sheehan.  He will see you in his office tomorrow afternoon.  Call in the morning for an appt time.  Wash the lacer ation twice daily with soap and water then apply antibiotic ointment.  Take your hydrocodone as prescribed.  Apply ice to scalp and neck several times daily.  Staple  removal in 1 week.

## 2011-10-24 NOTE — ED Provider Notes (Signed)
Medical screening examination/treatment/procedure(s) were performed by non-physician practitioner and as supervising physician I was immediately available for consultation/collaboration.   Shelda Jakes, MD 10/24/11 [phone]

## 2011-11-01 ENCOUNTER — Emergency Department (HOSPITAL_COMMUNITY)
Admission: EM | Admit: 2011-11-01 | Discharge: 2011-11-01 | Disposition: A | Discharge status: Home / Self Care | Payer: Medicare Other | Attending: Emergency Medicine | Admitting: Emergency Medicine

## 2011-11-01 ENCOUNTER — Encounter (HOSPITAL_COMMUNITY): Payer: Medicare Other

## 2011-11-01 ENCOUNTER — Encounter (HOSPITAL_COMMUNITY): Payer: Self-pay | Admitting: Emergency Medicine

## 2011-11-01 ENCOUNTER — Emergency Department (HOSPITAL_COMMUNITY): Payer: Medicare Other

## 2011-11-01 DIAGNOSIS — E785 Hyperlipidemia, unspecified: Secondary | ICD-10-CM | POA: Insufficient documentation

## 2011-11-01 DIAGNOSIS — I10 Essential (primary) hypertension: Secondary | ICD-10-CM | POA: Insufficient documentation

## 2011-11-01 DIAGNOSIS — I2510 Atherosclerotic heart disease of native coronary artery without angina pectoris: Secondary | ICD-10-CM | POA: Insufficient documentation

## 2011-11-01 DIAGNOSIS — Z7982 Long term (current) use of aspirin: Secondary | ICD-10-CM | POA: Insufficient documentation

## 2011-11-01 DIAGNOSIS — M25511 Pain in right shoulder: Secondary | ICD-10-CM

## 2011-11-01 DIAGNOSIS — M25419 Effusion, unspecified shoulder: Secondary | ICD-10-CM | POA: Insufficient documentation

## 2011-11-01 DIAGNOSIS — M25519 Pain in unspecified shoulder: Secondary | ICD-10-CM | POA: Insufficient documentation

## 2011-11-01 DIAGNOSIS — R609 Edema, unspecified: Secondary | ICD-10-CM | POA: Insufficient documentation

## 2011-11-01 DIAGNOSIS — Z79899 Other long term (current) drug therapy: Secondary | ICD-10-CM | POA: Insufficient documentation

## 2011-11-01 LAB — BASIC METABOLIC PANEL
BUN: 14 mg/dL (ref 6–23)
Calcium: 9.6 mg/dL (ref 8.4–10.5)
GFR calc Af Amer: 69 mL/min — ABNORMAL LOW (ref >90)
GFR calc non Af Amer: 60 mL/min — ABNORMAL LOW (ref >90)
Potassium: 4.2 mEq/L (ref 3.5–5.1)
Sodium: 131 mEq/L — ABNORMAL LOW (ref 135–145)

## 2011-11-01 LAB — CBC
MCHC: 32.5 g/dL (ref 30.0–36.0)
RDW: 16.2 % — ABNORMAL HIGH (ref 11.5–15.5)

## 2011-11-01 LAB — DIFFERENTIAL
Basophils Absolute: 0 10*3/uL (ref 0.0–0.1)
Basophils Relative: 0 % (ref 0–1)
Eosinophils Absolute: 0.1 10*3/uL (ref 0.0–0.7)
Monocytes Absolute: 1.3 10*3/uL — ABNORMAL HIGH (ref 0.1–1.0)
Neutro Abs: 5.4 10*3/uL (ref 1.7–7.7)
Neutrophils Relative %: 67 % (ref 43–77)

## 2011-11-01 MED ORDER — VANCOMYCIN HCL IN DEXTROSE 1-5 GM/200ML-% IV SOLN
1000.0000 mg | Freq: Once | INTRAVENOUS | Status: DC
Start: 2011-11-01 — End: 2011-11-02
  Filled 2011-11-01: qty 200

## 2011-11-01 MED ORDER — HYDROCODONE-ACETAMINOPHEN 5-325 MG PO TABS
1.0000 | ORAL_TABLET | Freq: Once | ORAL | Status: AC
  Administered 2011-11-01: 1 via ORAL
  Filled 2011-11-01: qty 1

## 2011-11-01 MED ORDER — SODIUM CHLORIDE 0.9 % IJ SOLN: 10.0000 mL | Freq: Two times a day (BID) | INTRAMUSCULAR | Status: DC

## 2011-11-01 MED ORDER — OXYCODONE-ACETAMINOPHEN 5-325 MG PO TABS: 1.0000 | ORAL_TABLET | ORAL | Status: AC | PRN

## 2011-11-01 MED ORDER — HYDROMORPHONE HCL PF 1 MG/ML IJ SOLN
1.0000 mg | Freq: Once | INTRAMUSCULAR | Status: AC
  Administered 2011-11-01: 1 mg via INTRAMUSCULAR
  Filled 2011-11-01: qty 1

## 2011-11-01 MED ORDER — ONDANSETRON 8 MG PO TBDP
8.0000 mg | ORAL_TABLET | Freq: Once | ORAL | Status: AC
  Administered 2011-11-01: 8 mg via ORAL
  Filled 2011-11-01: qty 1

## 2011-11-01 MED ORDER — KETOROLAC TROMETHAMINE 60 MG/2ML IM SOLN
60.0000 mg | Freq: Once | INTRAMUSCULAR | Status: AC
  Administered 2011-11-01: 60 mg via INTRAMUSCULAR
  Filled 2011-11-01: qty 2

## 2011-11-01 MED ORDER — HYDROMORPHONE HCL PF 1 MG/ML IJ SOLN
0.5000 mg | Freq: Once | INTRAMUSCULAR | Status: AC
  Administered 2011-11-01: 0.5 mg via INTRAVENOUS
  Filled 2011-11-01: qty 1

## 2011-11-01 MED ORDER — SODIUM CHLORIDE 0.9 % IJ SOLN: 10.0000 mL | INTRAMUSCULAR | Status: DC | PRN

## 2011-11-01 NOTE — ED Notes (Addendum)
Pt presents with right shoulder pain. Pt states he fell two weeks ago. Pt presents with c-collar in place. Pt with painful ROM. Pt able to move all other extremities. Pulses palp. Skin intact. Pt lying supine in stretcher. Stretcher in low locked position. Side rail up for pt safety. Call light within reach. Education on plan of care provided. Pt verbalized understanding.

## 2011-11-01 NOTE — ED Notes (Signed)
Pt a/ox4. Resp even and unlabored. NAD at this time. D/C instructions reviewed with pt. Pt verbalized understanding. Pt ambulated to lobby with steady gate.  

## 2011-11-01 NOTE — ED Provider Notes (Signed)
Plan discussed with Dr. Romeo Apple. Patient has had shoulder infections before and grows out "atypical bacteria". Dr. Romeo Apple wishes for IV vancomycin as described in PA Triplett's note.  He recommends we do not attempt arthrocentesis as it would not be beneficial or change the course of treatment today. He'll see the patient on Monday.  Glynn Octave, MD 11/01/11 [phone]

## 2011-11-01 NOTE — ED Provider Notes (Signed)
History     CSN: [id number]  Arrival date & time 11/01/11  1404   First MD Initiated Contact with Patient 11/01/11 1420      Chief Complaint  Patient presents with  . Shoulder Pain    (Consider location/radiation/quality/duration/timing/severity/associated sxs/prior treatment) HPI Comments: Patient complains of right shoulder pain that began this morning. He states pain is worse with movement. He also reports noticing redness, excessive warmth and swelling to his shoulder. Patient states that he fell 2 weeks ago and was treated here for a cervical fracture.  He denies pain to his shoulder at the time of the injury.  He denies numbness, weakness, fever or chills.  Patient states he has a history of a surgical rotator cuff repair to the right shoulder approximately 2 years ago and has limited range of motion to the shoulder since that time.  He also states that he has had multiple surgeries in the past that developed post-op MRSA infections  Patient is a 71 y.o. male presenting with shoulder pain. The history is provided by the patient and the spouse.  Shoulder Pain This is a new problem. The current episode started today. The problem occurs constantly. The problem has been unchanged. Associated symptoms include arthralgias and joint swelling. Pertinent negatives include no chest pain, fever, headaches, nausea, neck pain, numbness, swollen glands, visual change, vomiting or weakness. Exacerbated by: Movement and palpation. He has tried oral narcotics for the symptoms. The treatment provided no relief.    Past Medical History  Diagnosis Date  . HTN (hypertension)   . Hyperlipidemia   . Coronary artery disease     IN 2000   STENT PLACED IN 2012  . Hx of cardiac cath     STENT PLACED IN 2012  . Small bowel problem     HAD ALOT OF SCAR TISSUE FROM PREVIOUS SURGERIES..NG WAS INSERTED ...Marland KitchenMarland KitchenNO SURGERY NEEDED...Marland KitchenMarland KitchenIN FOR 8 DAYS  . Disorder of blood     BEEN TREATED BY DERMATOLOGIST X 4  YRS..."BLOOD BLISTERS"    Past Surgical History  Procedure Date  . Right shoulder dr Romeo Apple   . Coronary stent placement   . Coronary artery bypass graft   . Sternal surg     HAS HAD 5-6 ON HIS STERNUM  . Coronary angioplasty   . Cataracts     BILATERAL  . Tonsillectomy   . Cholecystectomy   . Lumbar laminectomy/decompression microdiscectomy 06/13/2011    Procedure: LUMBAR LAMINECTOMY/DECOMPRESSION MICRODISCECTOMY;  Surgeon: Karn Cassis;  Location: [facility];  Service: Neurosurgery;  Laterality: N/A;  Lumbar three, lumbar four-five Laminectomy    Family History  Problem Relation Age of Onset  . Heart disease    . Hypertension Mother   . Hypertension Maternal Aunt   . Hypertension Maternal Uncle     History  Substance Use Topics  . Smoking status: Former Smoker -- 1.0 packs/day for 1 years    Types: Cigarettes    Quit date: 12/27/1998  . Smokeless tobacco: Not on file  . Alcohol Use: No      Review of Systems  Constitutional: Negative for fever.  HENT: Negative for neck pain.   Cardiovascular: Negative for chest pain.  Gastrointestinal: Negative for nausea and vomiting.  Musculoskeletal: Positive for joint swelling and arthralgias. Negative for back pain and gait problem.  Skin: Negative.   Neurological: Negative for dizziness, weakness, numbness and headaches.  All other systems reviewed and are negative.    Allergies  Metoprolol and Morphine  Home  Medications   Current Outpatient Rx  Name Route Sig Dispense Refill  . ASPIRIN 81 MG PO TABS Oral Take 81 mg by mouth as needed.     . ATORVASTATIN CALCIUM 40 MG PO TABS Oral Take 20 mg by mouth at bedtime.     Marland Kitchen CITALOPRAM HYDROBROMIDE 20 MG PO TABS Oral Take 20 mg by mouth daily.     Marland Kitchen DOXAZOSIN MESYLATE 8 MG PO TABS Oral Take 8 mg by mouth at bedtime.     Marland Kitchen PREDNISONE 20 MG PO TABS Oral Take 17.5 mg by mouth daily.     Marland Kitchen NITROGLYCERIN 0.4 MG SL SUBL Sublingual Place 1 tablet (0.4 mg total) under  the tongue every 5 (five) minutes as needed. 25 tablet 3    BP 121/59  Pulse 76  Temp(Src) 98.7 F (37.1 C) (Oral)  Resp 20  Ht 6' (1.829 m)  Wt 210 lb (95.255 kg)  BMI 28.48 kg/m2  SpO2 96%  Physical Exam  Nursing note and vitals reviewed. Constitutional: He is oriented to person, place, and time. He appears well-developed and well-nourished. No distress.  HENT:  Head: Normocephalic and atraumatic.  Mouth/Throat: Oropharynx is clear and moist.  Neck: Normal range of motion. Neck supple.       Patient is wearing her cervical collar  Cardiovascular: Normal rate, regular rhythm, normal heart sounds and intact distal pulses.   Pulmonary/Chest: Effort normal and breath sounds normal. No respiratory distress. He exhibits no tenderness.  Musculoskeletal: He exhibits edema and tenderness.       Right shoulder: He exhibits decreased range of motion, tenderness, bony tenderness, swelling and pain. He exhibits no effusion, no crepitus, no deformity, no laceration, normal pulse and normal strength.       Diffuse tenderness to palpation of the right shoulder joint. Surgical scars present to the right anterior shoulder. Mild to moderate STS and localized erythema of the over the joint.   Patient has limited range of motion of the right shoulder joint but appears to be at his baseline.  Lymphadenopathy:    He has no cervical adenopathy.  Neurological: He is alert and oriented to person, place, and time. He exhibits normal muscle tone. Coordination normal.  Skin: Skin is warm and dry.    ED Course  Procedures (including critical care time)  Labs Reviewed  CBC - Abnormal; Notable for the following:    RBC 3.71 (*)    Hemoglobin 10.2 (*)    HCT 31.4 (*)    RDW 16.2 (*)    All other components within normal limits  DIFFERENTIAL - Abnormal; Notable for the following:    Monocytes Relative 17 (*)    Monocytes Absolute 1.3 (*)    All other components within normal limits   Dg Shoulder  Right  11/01/2011  *RADIOLOGY REPORT*  Clinical Data: Shoulder pain  RIGHT SHOULDER - 2+ VIEW  Comparison: 02/04/2010  Findings: There are three suture anchors identified within the right humeral head.  There is moderate soft tissue swelling overlying the shoulder.  Mild to moderate osteoarthritis is noted involving the acromioclavicular joint.  No acute fractures or subluxations identified.  IMPRESSION:  1.  Soft tissue swelling overlying the right shoulder. 2.  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  Original Report Authenticated By: Rosealee Albee, M.D.     Sedimentation rate is pending   MDM     Patient was also seen by EDP and care plan was discussed.  I will consult Dr. Romeo Apple regarding further mangement    I have consulted  Dr. Romeo Apple. He wants the patient to return here for IV vancomycin daily for 10 days through the specialty clinic and to have a PICC line inserted. He was see the patient in his office on Monday for follow-up.  I have spoken with the house [facility] and she advised me to have patient return to ED tomorrow for 2nd vancomycin dose and write orders for remaining doses to be given done through the speciality clinic on Monday.  [id number]  nurse is here in ED to insert the PICC line.    Chakia Counts L. Lenaya Pietsch, PA 11/01/11 1757  Trayce Caravello L. [place], [place] 11/01/11 1842

## 2011-11-01 NOTE — Discharge Instructions (Signed)
Arthralgia Arthralgia is joint pain. A joint is a place where two bones meet. Joint pain can happen for many reasons. The joint can be bruised, stiff, infected, or weak from aging. Pain usually goes away after resting and taking medicine for soreness.  HOME CARE  Rest the joint as told by your doctor.   Keep the sore joint raised (elevated) for the first 24 hours.   Put ice on the joint area.   Put ice in a plastic bag.   Place a towel between your skin and the bag.   Leave the ice on for 15 to 20 minutes, 3 to 4 times a day.   Wear your splint, casting, elastic bandage, or sling as told by your doctor.   Only take medicine as told by your doctor. Do not take aspirin.   Use crutches as told by your doctor. Do not put weight on the joint until told to by your doctor.  GET HELP RIGHT AWAY IF:   You have bruising, puffiness (swelling), or more pain.   Your fingers or toes turn blue or start to lose feeling (numb).   Your medicine does not lessen the pain.   Your pain becomes severe.   You have a temperature by mouth above 102 F (38.9 C), not controlled by medicine.   You cannot move or use the joint.  MAKE SURE YOU:   Understand these instructions.   Will watch your condition.   Will get help right away if you are not doing well or get worse.  Document Released: 07/02/2009 Document Revised: 07/03/2011 Document Reviewed: 07/02/2009 [employer] Patient Information 2012 [employer]. 

## 2011-11-01 NOTE — ED Notes (Signed)
Pt with right shoulder pain.  Denies injury except for fall that happened two weeks ago.  Pt wearing C-collar

## 2011-11-01 NOTE — ED Provider Notes (Signed)
Pt seen with PA He is here for shoulder pain/swelling He is afebrile, initial labs reassuring He does have warmth/swelling to the shoulder His orthopedist is dr Romeo Apple, and plan is to consult him for further recommendations   Joya Gaskins, MD 11/01/11 1554

## 2011-11-02 ENCOUNTER — Encounter (HOSPITAL_COMMUNITY): Payer: Self-pay

## 2011-11-02 ENCOUNTER — Emergency Department (HOSPITAL_COMMUNITY)
Admission: EM | Admit: 2011-11-02 | Discharge: 2011-11-02 | Disposition: A | Discharge status: Home / Self Care | Payer: Medicare Other | Attending: Emergency Medicine | Admitting: Emergency Medicine

## 2011-11-02 DIAGNOSIS — I2510 Atherosclerotic heart disease of native coronary artery without angina pectoris: Secondary | ICD-10-CM | POA: Insufficient documentation

## 2011-11-02 DIAGNOSIS — M25519 Pain in unspecified shoulder: Secondary | ICD-10-CM | POA: Insufficient documentation

## 2011-11-02 DIAGNOSIS — I10 Essential (primary) hypertension: Secondary | ICD-10-CM | POA: Insufficient documentation

## 2011-11-02 DIAGNOSIS — F17200 Nicotine dependence, unspecified, uncomplicated: Secondary | ICD-10-CM | POA: Insufficient documentation

## 2011-11-02 DIAGNOSIS — E785 Hyperlipidemia, unspecified: Secondary | ICD-10-CM | POA: Insufficient documentation

## 2011-11-02 DIAGNOSIS — Z792 Long term (current) use of antibiotics: Secondary | ICD-10-CM | POA: Insufficient documentation

## 2011-11-02 DIAGNOSIS — M7989 Other specified soft tissue disorders: Secondary | ICD-10-CM | POA: Insufficient documentation

## 2011-11-02 MED ORDER — VANCOMYCIN HCL 1000 MG IV SOLR
1500.0000 mg | INTRAVENOUS | Status: DC
  Filled 2011-11-02: qty 1500

## 2011-11-02 MED ORDER — VANCOMYCIN HCL IN DEXTROSE 1-5 GM/200ML-% IV SOLN
1000.0000 mg | Freq: Once | INTRAVENOUS | Status: AC
  Administered 2011-11-02: 1000 mg via INTRAVENOUS
  Filled 2011-11-02: qty 200

## 2011-11-02 NOTE — ED Notes (Signed)
Patient with no complaints at this time. Respirations even and unlabored. Skin warm/dry. Discharge instructions reviewed with patient at this time. Patient given opportunity to voice concerns/ask questions. Patient discharged at this time and left Emergency Department with steady gait.   

## 2011-11-02 NOTE — ED Notes (Signed)
In ed for iv meds

## 2011-11-02 NOTE — ED Provider Notes (Signed)
History     CSN: [id number]  Arrival date & time 11/02/11  1640   First MD Initiated Contact with Patient 11/02/11 1710      Chief Complaint  Patient presents with  . IV Medication    (Consider location/radiation/quality/duration/timing/severity/associated sxs/prior treatment) HPI Comments: Patient presents for his second dose of IV vancomycin.  He was seen here yesterday for right shoulder pain and he was diagnosed with possible septic shoulder joint after a fall he sustained approximately 2 weeks ago causing injury to this joint.  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  He presented just say with increased pain along with swelling and erythema over the joint.  After discussion with Dr.Harrison, it was decided that he should undergo a ten-day course of IV vancomycin, and he is scheduled to see Dr. Romeo Apple in the office tomorrow morning.  He reports the shoulder pain is improved today, there is less swelling and erythema as well.  He denies fevers and chills, no nausea or vomiting.  Future doses of vancomycin are scheduled  for the specialty clinic care at the hospital.    The history is provided by the patient.    Past Medical History  Diagnosis Date  . HTN (hypertension)   . Hyperlipidemia   . Coronary artery disease     IN 2000   STENT PLACED IN 2012  . Hx of cardiac cath     STENT PLACED IN 2012  . Small bowel problem     HAD ALOT OF SCAR TISSUE FROM PREVIOUS SURGERIES..NG WAS INSERTED ...Marland KitchenMarland KitchenNO SURGERY NEEDED...Marland KitchenMarland KitchenIN FOR 8 DAYS  . Disorder of blood     BEEN TREATED BY DERMATOLOGIST X 4 YRS..."BLOOD BLISTERS"    Past Surgical History  Procedure Date  . Right shoulder dr Romeo Apple   . Coronary stent placement   . Coronary artery bypass graft   . Sternal surg     HAS HAD 5-6 ON HIS STERNUM  . Coronary angioplasty   . Cataracts     BILATERAL  . Tonsillectomy   . Cholecystectomy   . Lumbar laminectomy/decompression microdiscectomy  06/13/2011    Procedure: LUMBAR LAMINECTOMY/DECOMPRESSION MICRODISCECTOMY;  Surgeon: Karn Cassis;  Location: [facility];  Service: Neurosurgery;  Laterality: N/A;  Lumbar three, lumbar four-five Laminectomy    Family History  Problem Relation Age of Onset  . Heart disease    . Hypertension Mother   . Hypertension Maternal Aunt   . Hypertension Maternal Uncle     History  Substance Use Topics  . Smoking status: Former Smoker -- 1.0 packs/day for 1 years    Types: Cigarettes    Quit date: 12/27/1998  . Smokeless tobacco: Not on file  . Alcohol Use: No      Review of Systems  Constitutional: Negative for fever.  HENT: Negative for congestion, sore throat and neck pain.   Eyes: Negative.   Respiratory: Negative for chest tightness and shortness of breath.   Cardiovascular: Negative for chest pain.  Gastrointestinal: Negative for nausea and abdominal pain.  Genitourinary: Negative.   Musculoskeletal: Positive for joint swelling and arthralgias.  Skin: Negative.  Negative for rash and wound.  Neurological: Negative for dizziness, weakness, light-headedness, numbness and headaches.  Hematological: Negative.   Psychiatric/Behavioral: Negative.     Allergies  Metoprolol and Morphine  Home Medications   Current Outpatient Rx  Name Route Sig Dispense Refill  . ASPIRIN EC 81 MG PO TBEC Oral Take 81 mg  by mouth daily.    . ATORVASTATIN CALCIUM 40 MG PO TABS Oral Take 20 mg by mouth at bedtime.     Marland Kitchen CITALOPRAM HYDROBROMIDE 20 MG PO TABS Oral Take 20 mg by mouth daily.     Marland Kitchen DOXAZOSIN MESYLATE 8 MG PO TABS Oral Take 8 mg by mouth at bedtime.     . OXYCODONE-ACETAMINOPHEN 5-325 MG PO TABS Oral Take 1 tablet by mouth every 4 (four) hours as needed for pain. 24 tablet 0  . NITROGLYCERIN 0.4 MG SL SUBL Sublingual Place 1 tablet (0.4 mg total) under the tongue every 5 (five) minutes as needed. 25 tablet 3    BP 146/61  Pulse 68  Temp(Src) 99.4 F (37.4 C) (Oral)  Resp 20   Wt 210 lb (95.255 kg)  SpO2 100%  Physical Exam  Nursing note and vitals reviewed. Constitutional: He is oriented to person, place, and time. He appears well-developed and well-nourished.  HENT:  Head: Normocephalic and atraumatic.  Eyes: Conjunctivae are normal.  Neck: Neck supple.  Cardiovascular: Normal rate and intact distal pulses.   Pulmonary/Chest: Effort normal and breath sounds normal.  Musculoskeletal: He exhibits edema and tenderness.       Right shoulder: He exhibits tenderness.       Slight edema with erythema along the surgical scar of right shoulder joint.  He has a baseline range of motion in the shoulder.  Distal sensation is intact, radial pulses 2+, less than 2 second cap refill.  No red streaking.  Lymphadenopathy:       Right axillary: No pectoral and no lateral adenopathy present.  Neurological: He is alert and oriented to person, place, and time.  Skin: Skin is warm and dry.  Psychiatric: He has a normal mood and affect.    ED Course  Procedures (including critical care time)  Labs Reviewed - No data to display Dg Shoulder Right  11/01/2011  *RADIOLOGY REPORT*  Clinical Data: Shoulder pain  RIGHT SHOULDER - 2+ VIEW  Comparison: 02/04/2010  Findings: There are three suture anchors identified within the right humeral head.  There is moderate soft tissue swelling overlying the shoulder.  Mild to moderate osteoarthritis is noted involving the acromioclavicular joint.  No acute fractures or subluxations identified.  IMPRESSION:  1.  Soft tissue swelling overlying the right shoulder. 2.  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  Original Report Authenticated By: Rosealee Albee, M.D.   Dg Chest Port 1 View  11/01/2011  *RADIOLOGY REPORT*  Clinical Data: PICC  insertion  PORTABLE CHEST - 1 VIEW  Comparison: 06/09/2011  Findings: Left arm PICC has been placed with the catheter tip near the cavoatrial junction.  COPD.  Pleural scarring  in the right lung base is unchanged. Negative for heart failure.  IMPRESSION: PICC tip at the cavoatrial junction.  Original Report Authenticated By: Camelia Phenes, M.D.     1. Shoulder pain       MDM  IV vancomycin 1 g given her PICC line which was placed at yesterday's visit.  Patient tolerated well.  He is scheduled to see Dr. Romeo Apple tomorrow morning at 9 AM.  His next vancomycin dose is scheduled for 2:30 PM tomorrow.  Patient expressed concern that he has a neurosurgical appointment at 2:30.  He was advised to call the specialty clinic tomorrow morning to reschedule his time, phone number was given.  If this is not possible, he was advised to come to the ER  for his next dose.        Candis Musa, PA 11/02/11 1851

## 2011-11-03 ENCOUNTER — Encounter (HOSPITAL_COMMUNITY): Payer: Medicare Other

## 2011-11-03 ENCOUNTER — Ambulatory Visit: Payer: Medicare Other | Admitting: Orthopedic Surgery

## 2011-11-03 MED ORDER — VANCOMYCIN HCL 1000 MG IV SOLR
1500.0000 mg | INTRAVENOUS | Status: DC
  Filled 2011-11-03 (×6): qty 1500

## 2011-11-03 MED ORDER — VANCOMYCIN HCL IN DEXTROSE 1-5 GM/200ML-% IV SOLN: 1000.0000 mg | INTRAVENOUS | Status: DC

## 2011-11-03 NOTE — ED Provider Notes (Signed)
Medical screening examination/treatment/procedure(s) were conducted as a shared visit with non-physician practitioner(s) and myself.  I personally evaluated the patient during the encounter    Joya Gaskins, MD 11/03/11 [phone]

## 2011-11-03 NOTE — ED Provider Notes (Signed)
Medical screening examination/treatment/procedure(s) were performed by non-physician practitioner and as supervising physician I was immediately available for consultation/collaboration. Richa Shor, MD, FACEP   Bennetta Rudden L Landrum Carbonell, MD 11/03/11 0956 

## 2011-11-04 ENCOUNTER — Other Ambulatory Visit (HOSPITAL_COMMUNITY): Payer: Self-pay | Admitting: Neurosurgery

## 2011-11-04 ENCOUNTER — Other Ambulatory Visit: Payer: Self-pay | Admitting: Orthopedic Surgery

## 2011-11-04 ENCOUNTER — Encounter: Payer: Self-pay | Admitting: Orthopedic Surgery

## 2011-11-04 ENCOUNTER — Ambulatory Visit (INDEPENDENT_AMBULATORY_CARE_PROVIDER_SITE_OTHER): Payer: Medicare Other | Admitting: Orthopedic Surgery

## 2011-11-04 ENCOUNTER — Encounter (HOSPITAL_COMMUNITY): Payer: Medicare Other | Attending: Pulmonary Disease

## 2011-11-04 VITALS — BP 126/60 | Ht 72.0 in | Wt 210.0 lb

## 2011-11-04 VITALS — BP 158/80 | HR 63 | Temp 98.4°F

## 2011-11-04 DIAGNOSIS — M25419 Effusion, unspecified shoulder: Secondary | ICD-10-CM

## 2011-11-04 DIAGNOSIS — A4902 Methicillin resistant Staphylococcus aureus infection, unspecified site: Secondary | ICD-10-CM

## 2011-11-04 DIAGNOSIS — S129XXA Fracture of neck, unspecified, initial encounter: Secondary | ICD-10-CM

## 2011-11-04 MED ORDER — VANCOMYCIN HCL 1000 MG IV SOLR
1500.0000 mg | Freq: Once | INTRAVENOUS | Status: AC
  Administered 2011-11-04: 1500 mg via INTRAVENOUS

## 2011-11-04 MED ORDER — SODIUM CHLORIDE 0.9 % IJ SOLN
10.0000 mL | INTRAMUSCULAR | Status: DC | PRN
  Administered 2011-11-04: 10 mL via INTRAVENOUS

## 2011-11-04 MED ORDER — HEPARIN SOD (PORK) LOCK FLUSH 100 UNIT/ML IV SOLN
300.0000 [IU] | Freq: Once | INTRAVENOUS | Status: AC
  Administered 2011-11-04: 300 [IU] via INTRAVENOUS

## 2011-11-04 MED ORDER — SODIUM CHLORIDE 0.9 % IV SOLN
INTRAVENOUS | Status: DC
  Administered 2011-11-04: 10:00:00 via INTRAVENOUS

## 2011-11-04 NOTE — Progress Notes (Signed)
Subjective:    [name] is a 71 y.o. male who presents with pain, swelling RIGHT shoulder.  The patient has had multiple procedures on the RIGHT shoulder.  He had an attempt at repair of a massive rotator cuff tear which was unsuccessful and complicated by infection which was treated with irrigation debridement and IV antibiotics after several attempts at aspiration and oral antibiotics.  His infection cleared and he was doing fairly well until a recent fall.  He fell and fractured his cervical spine and will require surgery.  However there is concerned because of the recent onset of pain and swelling in the RIGHT shoulder with redness loss of motion and pain.  On April 6 after 24 hours of increasing pain he went to the emergency room and had an x-ray which was normal white count normal no fever but increasing pain.  This is very similar to the last presentation for joint infection at which time he required open debridement  After 2 days of vancomycin his symptoms have improved redness has decreased pain has gotten better.  He comes in for evaluation.   The following portions of the patient's history were reviewed and updated as appropriate: allergies, current medications, past family history, past medical history, past social history, past surgical history and problem list.  Review of Systems A comprehensive review of systems was negative except for: complaints of blurred vision and watering of the eyes.  Shortness of breath.  Constipation.  Numbness tingling and dizziness.  Depression.  Easy bleeding.  Easy bruising.   Objective:  Physical Exam(12) GENERAL: normal development , he is in a cervical collar hard collar  He can move his hand normally.  Moves elbows normally.  CDV: pulses are normal   Skin: normal  Lymph: nodes were not palpable/normal  Psychiatric: awake, alert and oriented  Neuro: normal sensation   BP 126/60  Ht 6' (1.829 m)  Wt 210 lb (95.255 kg)  BMI  28.48 kg/m2 Right shoulder: the shoulder is warm to touch and there is swelling over the previous rotator cuff incision.  An aspiration was attempted and 5 cc of cloudy greenish yellow fluid was obtained.  This is similar to the fluid that was obtained when he had the infection on the last occasion.  He has painful range of motion in the shoulder.  The skin is warm to touch there is no redness however as this has gotten better after the vancomycin according to the patient.  Left shoulder: No pain swelling or tenderness in the LEFT shoulder although there is a history of symptoms of cuff disease in the shoulder as well     Assessment:    Right infection   shoulder  Plan:    the patient is on IV antibiotics which are appropriate at this time as he is not amenable to surgical treatment with a fractured vertebrae that's requiring surgery as he could not be intubated safely.   to try to come up with a treatment plan in the meantime we will continue IV antibiotics.  We did aspirate the shoulder and the fluid was sent for culture  Again the patient is scheduled for a cervical decompression secondary to fractured vertebrae and cord pressure.  He would not be a surgical candidate at our hospital as he would require intubation.   Procedure note  RIGHT shoulder aspiration  D. RIGHT shoulder was prepped with alcohol.  An 18-gauge needle was used to aspirate the RIGHT shoulder and we obtained 5  cc of yellow green cloudy fluid.

## 2011-11-04 NOTE — Patient Instructions (Signed)
Continue antibiotics

## 2011-11-04 NOTE — Progress Notes (Signed)
Infusion complete, patient tolerated well.   

## 2011-11-05 ENCOUNTER — Encounter (HOSPITAL_COMMUNITY): Payer: Medicare Other

## 2011-11-05 VITALS — BP 147/80 | HR 69 | Temp 98.4°F

## 2011-11-05 DIAGNOSIS — A4902 Methicillin resistant Staphylococcus aureus infection, unspecified site: Secondary | ICD-10-CM

## 2011-11-05 MED ORDER — HEPARIN SOD (PORK) LOCK FLUSH 100 UNIT/ML IV SOLN
500.0000 [IU] | Freq: Once | INTRAVENOUS | Status: AC
  Administered 2011-11-05: 500 [IU] via INTRAVENOUS

## 2011-11-05 MED ORDER — SODIUM CHLORIDE 0.9 % IJ SOLN
10.0000 mL | Freq: Once | INTRAMUSCULAR | Status: AC
  Administered 2011-11-05: 10 mL via INTRAVENOUS

## 2011-11-05 MED ORDER — SODIUM CHLORIDE 0.9 % IV SOLN
Freq: Once | INTRAVENOUS | Status: AC
  Administered 2011-11-05: 11:00:00 via INTRAVENOUS

## 2011-11-05 MED ORDER — VANCOMYCIN HCL 1000 MG IV SOLR
1500.0000 mg | Freq: Once | INTRAVENOUS | Status: AC
  Administered 2011-11-05: 1500 mg via INTRAVENOUS
  Filled 2011-11-05: qty 1500

## 2011-11-05 NOTE — Progress Notes (Signed)
[name] presented for PICC line flush and IV vancomycin.. Proper placement of PICC confirmed by CXR. PICC line located left arm. . Good blood return present. PICC line flushed with 20ml NS and 300U/19ml Heparin and dressing and stat lock changed.. Procedure without incident. Patient tolerated procedure well.

## 2011-11-06 ENCOUNTER — Encounter (HOSPITAL_COMMUNITY): Payer: Medicare Other

## 2011-11-06 ENCOUNTER — Encounter: Payer: Self-pay | Admitting: Orthopedic Surgery

## 2011-11-06 ENCOUNTER — Telehealth: Payer: Self-pay | Admitting: Orthopedic Surgery

## 2011-11-06 VITALS — BP 147/79 | HR 71 | Temp 98.4°F

## 2011-11-06 DIAGNOSIS — A4902 Methicillin resistant Staphylococcus aureus infection, unspecified site: Secondary | ICD-10-CM

## 2011-11-06 MED ORDER — SODIUM CHLORIDE 0.9 % IV SOLN
1500.0000 mg | Freq: Once | INTRAVENOUS | Status: AC
  Administered 2011-11-06: 1500 mg via INTRAVENOUS
  Filled 2011-11-06: qty 1500

## 2011-11-06 MED ORDER — VANCOMYCIN HCL IN DEXTROSE 1-5 GM/200ML-% IV SOLN: 1000.0000 mg | Freq: Once | INTRAVENOUS | Status: DC

## 2011-11-06 MED ORDER — SODIUM CHLORIDE 0.9 % IV SOLN
INTRAVENOUS | Status: DC
  Administered 2011-11-06: 250 mL via INTRAVENOUS

## 2011-11-06 NOTE — Progress Notes (Signed)
Tolerated well

## 2011-11-06 NOTE — Telephone Encounter (Signed)
[name] says his shoulder is hurting worse and is swollen more.  He is scheduled to see you Tuesday, 11/11/11.  Said if he is going to need surgery, he wants to get it done SAP. His # [phone]

## 2011-11-06 NOTE — Telephone Encounter (Signed)
READ COMMENTS :)

## 2011-11-06 NOTE — Telephone Encounter (Signed)
Spoke with botero he needs to see mri results before i can tell him next step

## 2011-11-07 ENCOUNTER — Encounter (HOSPITAL_COMMUNITY): Payer: Medicare Other

## 2011-11-07 DIAGNOSIS — A4902 Methicillin resistant Staphylococcus aureus infection, unspecified site: Secondary | ICD-10-CM

## 2011-11-07 NOTE — Progress Notes (Signed)
Tolerated infusion well. 

## 2011-11-08 ENCOUNTER — Encounter (HOSPITAL_COMMUNITY): Payer: Medicare Other

## 2011-11-08 VITALS — BP 163/75 | HR 74 | Temp 97.8°F

## 2011-11-08 DIAGNOSIS — A4902 Methicillin resistant Staphylococcus aureus infection, unspecified site: Secondary | ICD-10-CM

## 2011-11-08 LAB — BODY FLUID CULTURE: Gram Stain: NONE SEEN

## 2011-11-08 MED ORDER — VANCOMYCIN HCL 1000 MG IV SOLR
1500.0000 mg | Freq: Once | INTRAVENOUS | Status: AC
  Administered 2011-11-08: 1500 mg via INTRAVENOUS
  Filled 2011-11-08: qty 1500

## 2011-11-08 MED ORDER — SODIUM CHLORIDE 0.9 % IV SOLN
Freq: Once | INTRAVENOUS | Status: AC
  Administered 2011-11-08: 09:00:00 via INTRAVENOUS

## 2011-11-08 NOTE — Progress Notes (Signed)
Tolerated well

## 2011-11-09 ENCOUNTER — Encounter (HOSPITAL_COMMUNITY): Payer: Medicare Other

## 2011-11-09 VITALS — BP 142/79 | HR 73 | Temp 98.9°F

## 2011-11-09 DIAGNOSIS — A4902 Methicillin resistant Staphylococcus aureus infection, unspecified site: Secondary | ICD-10-CM

## 2011-11-09 MED ORDER — HEPARIN SOD (PORK) LOCK FLUSH 100 UNIT/ML IV SOLN
300.0000 [IU] | Freq: Once | INTRAVENOUS | Status: AC
  Administered 2011-11-09: 300 [IU] via INTRAVENOUS

## 2011-11-09 MED ORDER — SODIUM CHLORIDE 0.9 % IV SOLN
Freq: Once | INTRAVENOUS | Status: AC
  Administered 2011-11-09: 09:00:00 via INTRAVENOUS

## 2011-11-09 MED ORDER — VANCOMYCIN HCL 1000 MG IV SOLR
1500.0000 mg | Freq: Once | INTRAVENOUS | Status: AC
  Administered 2011-11-09: 1500 mg via INTRAVENOUS
  Filled 2011-11-09: qty 1500

## 2011-11-09 NOTE — Progress Notes (Signed)
Tolerated well

## 2011-11-10 ENCOUNTER — Encounter (HOSPITAL_COMMUNITY): Payer: Medicare Other

## 2011-11-10 VITALS — BP 175/82 | HR 67 | Temp 98.0°F

## 2011-11-10 DIAGNOSIS — A4902 Methicillin resistant Staphylococcus aureus infection, unspecified site: Secondary | ICD-10-CM

## 2011-11-10 LAB — VANCOMYCIN, TROUGH: Vancomycin Tr: 13.7 ug/mL (ref 10.0–20.0)

## 2011-11-10 MED ORDER — SODIUM CHLORIDE 0.9 % IJ SOLN
10.0000 mL | INTRAMUSCULAR | Status: DC | PRN
  Administered 2011-11-10: 10 mL via INTRAVENOUS

## 2011-11-10 MED ORDER — SODIUM CHLORIDE 0.9 % IV SOLN
1500.0000 mg | INTRAVENOUS | Status: DC
  Administered 2011-11-10: 1500 mg via INTRAVENOUS
  Filled 2011-11-10 (×2): qty 1500

## 2011-11-10 MED ORDER — VANCOMYCIN HCL IN DEXTROSE 1-5 GM/200ML-% IV SOLN: 1000.0000 mg | Freq: Once | INTRAVENOUS | Status: DC

## 2011-11-10 MED ORDER — HEPARIN SOD (PORK) LOCK FLUSH 100 UNIT/ML IV SOLN
500.0000 [IU] | Freq: Once | INTRAVENOUS | Status: AC
  Administered 2011-11-10: 300 [IU] via INTRAVENOUS

## 2011-11-10 NOTE — Progress Notes (Signed)
Tolerated infusion well. 

## 2011-11-11 ENCOUNTER — Ambulatory Visit (INDEPENDENT_AMBULATORY_CARE_PROVIDER_SITE_OTHER): Payer: Medicare Other | Admitting: Orthopedic Surgery

## 2011-11-11 ENCOUNTER — Encounter (HOSPITAL_COMMUNITY): Payer: Medicare Other

## 2011-11-11 ENCOUNTER — Encounter: Payer: Self-pay | Admitting: Orthopedic Surgery

## 2011-11-11 VITALS — BP 124/60 | Ht 72.0 in | Wt 210.0 lb

## 2011-11-11 DIAGNOSIS — M12519 Traumatic arthropathy, unspecified shoulder: Secondary | ICD-10-CM

## 2011-11-11 DIAGNOSIS — M12811 Other specific arthropathies, not elsewhere classified, right shoulder: Secondary | ICD-10-CM

## 2011-11-11 DIAGNOSIS — M009 Pyogenic arthritis, unspecified: Secondary | ICD-10-CM

## 2011-11-11 DIAGNOSIS — A4902 Methicillin resistant Staphylococcus aureus infection, unspecified site: Secondary | ICD-10-CM

## 2011-11-11 MED ORDER — VANCOMYCIN HCL IN DEXTROSE 1-5 GM/200ML-% IV SOLN
1000.0000 mg | INTRAVENOUS | Status: DC
  Administered 2011-11-11: 1000 mg via INTRAVENOUS

## 2011-11-11 MED ORDER — SODIUM CHLORIDE 0.9 % IJ SOLN
10.0000 mL | INTRAMUSCULAR | Status: DC | PRN
  Administered 2011-11-11: 10 mL via INTRAVENOUS

## 2011-11-11 MED ORDER — SODIUM CHLORIDE 0.9 % IV SOLN
INTRAVENOUS | Status: DC
  Administered 2011-11-11: 10:00:00 via INTRAVENOUS

## 2011-11-11 MED ORDER — VANCOMYCIN HCL 1000 MG IV SOLR
1500.0000 mg | Freq: Once | INTRAVENOUS | Status: DC
  Filled 2011-11-11: qty 1500

## 2011-11-11 NOTE — Progress Notes (Signed)
RECENT VANCOMYCIN TROUGH LEVELS: Recent Labs  [facility] 11/10/11 0941   VANCOTROUGH 13.7   Continue 1500mg  IV Vancomycin every 24 hours until stopped by MD.  Mady Gemma, [facility] 11/11/2011 10:30 AM

## 2011-11-11 NOTE — Progress Notes (Signed)
Tolerated infusion well. 

## 2011-11-11 NOTE — Progress Notes (Signed)
Patient ID: [name], male   DOB: [birth year], 71 y.o.   MRN: [id number] Chief Complaint  Patient presents with  . Follow-up    1 week recheck right shoulder  DX: EFFUSION RIGHT SHOULDER:  CULTURE NEGATIVE   VANCOMYCIN IV DAY 9 MRI C SPINE 09/2011 IMPRESSION:  1. Acute C7 compression fracture with anterior wedging as  demonstrated by CT. Mild retropulsion of the posterior-superior  endplate, but with only borderline to mild spinal stenosis. No  spinal cord mass effect or signal abnormality.  2. Small volume prevertebral fluid centered at the C7 level could  be reactive or reflect associated anterior longitudinal ligament  injury.  3. 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   F/B DR Fletcher Anon SPOKE TO HIM LAST WEEK   ANOTHER MR NEEDED TO EVALUATE C SPINE   SYMPTOMS HAVE IMPROVED C/O SORENESS;  EXAM NO WARMTH OR REDNESS NO DRAINAGE   CONTINUE IV VANC   1 WK RE CHECK

## 2011-11-12 ENCOUNTER — Encounter (HOSPITAL_COMMUNITY): Payer: Medicare Other

## 2011-11-12 VITALS — BP 108/57 | HR 68 | Temp 97.7°F

## 2011-11-12 DIAGNOSIS — A4902 Methicillin resistant Staphylococcus aureus infection, unspecified site: Secondary | ICD-10-CM

## 2011-11-12 MED ORDER — SODIUM CHLORIDE 0.9 % IV SOLN
INTRAVENOUS | Status: DC
  Administered 2011-11-12: 10:00:00 via INTRAVENOUS

## 2011-11-12 MED ORDER — HEPARIN SOD (PORK) LOCK FLUSH 100 UNIT/ML IV SOLN: 500.0000 [IU] | Freq: Once | INTRAVENOUS | Status: DC

## 2011-11-12 MED ORDER — VANCOMYCIN HCL 1000 MG IV SOLR
1500.0000 mg | Freq: Once | INTRAVENOUS | Status: AC
  Administered 2011-11-12: 1500 mg via INTRAVENOUS
  Filled 2011-11-12: qty 1500

## 2011-11-12 MED ORDER — SODIUM CHLORIDE 0.9 % IJ SOLN
10.0000 mL | INTRAMUSCULAR | Status: DC | PRN
  Administered 2011-11-12: 10 mL via INTRAVENOUS

## 2011-11-12 NOTE — Progress Notes (Signed)
Tolerated vanco infusion well. 

## 2011-11-13 ENCOUNTER — Emergency Department (HOSPITAL_COMMUNITY): Payer: Medicare Other

## 2011-11-13 ENCOUNTER — Encounter (HOSPITAL_COMMUNITY): Payer: Medicare Other | Attending: Oncology

## 2011-11-13 ENCOUNTER — Encounter (HOSPITAL_COMMUNITY): Payer: Self-pay | Admitting: Emergency Medicine

## 2011-11-13 ENCOUNTER — Emergency Department (HOSPITAL_COMMUNITY)
Admission: EM | Admit: 2011-11-13 | Discharge: 2011-11-13 | Disposition: A | Discharge status: Home / Self Care | Payer: Medicare Other | Attending: Emergency Medicine | Admitting: Emergency Medicine

## 2011-11-13 DIAGNOSIS — M542 Cervicalgia: Secondary | ICD-10-CM | POA: Insufficient documentation

## 2011-11-13 DIAGNOSIS — W19XXXA Unspecified fall, initial encounter: Secondary | ICD-10-CM | POA: Insufficient documentation

## 2011-11-13 DIAGNOSIS — S129XXA Fracture of neck, unspecified, initial encounter: Secondary | ICD-10-CM

## 2011-11-13 DIAGNOSIS — S12600A Unspecified displaced fracture of seventh cervical vertebra, initial encounter for closed fracture: Secondary | ICD-10-CM | POA: Insufficient documentation

## 2011-11-13 DIAGNOSIS — E785 Hyperlipidemia, unspecified: Secondary | ICD-10-CM | POA: Insufficient documentation

## 2011-11-13 DIAGNOSIS — M79609 Pain in unspecified limb: Secondary | ICD-10-CM | POA: Insufficient documentation

## 2011-11-13 DIAGNOSIS — Z79899 Other long term (current) drug therapy: Secondary | ICD-10-CM | POA: Insufficient documentation

## 2011-11-13 DIAGNOSIS — I2510 Atherosclerotic heart disease of native coronary artery without angina pectoris: Secondary | ICD-10-CM | POA: Insufficient documentation

## 2011-11-13 DIAGNOSIS — I10 Essential (primary) hypertension: Secondary | ICD-10-CM | POA: Insufficient documentation

## 2011-11-13 DIAGNOSIS — M5430 Sciatica, unspecified side: Secondary | ICD-10-CM | POA: Insufficient documentation

## 2011-11-13 DIAGNOSIS — Z7982 Long term (current) use of aspirin: Secondary | ICD-10-CM | POA: Insufficient documentation

## 2011-11-13 DIAGNOSIS — M545 Low back pain, unspecified: Secondary | ICD-10-CM | POA: Insufficient documentation

## 2011-11-13 DIAGNOSIS — R262 Difficulty in walking, not elsewhere classified: Secondary | ICD-10-CM | POA: Insufficient documentation

## 2011-11-13 DIAGNOSIS — M1990 Unspecified osteoarthritis, unspecified site: Secondary | ICD-10-CM

## 2011-11-13 MED ORDER — GADOBENATE DIMEGLUMINE 529 MG/ML IV SOLN
19.0000 mL | Freq: Once | INTRAVENOUS | Status: AC | PRN
  Administered 2011-11-13: 19 mL via INTRAVENOUS

## 2011-11-13 MED ORDER — VANCOMYCIN HCL 1000 MG IV SOLR
1500.0000 mg | Freq: Once | INTRAVENOUS | Status: AC
  Administered 2011-11-13: 1500 mg via INTRAVENOUS

## 2011-11-13 NOTE — ED Notes (Signed)
Went to scan Vancomycin and would not scan stating, " Medication is not in current encounter" medication originally ordered by [name]. Spoke with Lorin Picket in pharmacy to re enter medication for ER purpose.

## 2011-11-13 NOTE — ED Notes (Signed)
Received report from [facility]. Pt currently at MRI at this time.

## 2011-11-13 NOTE — ED Notes (Signed)
[facility] and asked if pt could receive IV Vancomycin in ER d/t Clinic closing at 2pm today and to keep pt on track with infusion times. Will notify MD.

## 2011-11-13 NOTE — ED Notes (Signed)
Vancomycin still infusing.

## 2011-11-13 NOTE — ED Notes (Signed)
Talked with Benny in pharmacy regarding Vancomycin to be sent to ER for infusion

## 2011-11-13 NOTE — ED Provider Notes (Signed)
History   This chart was scribed for Flint Melter, MD by Cherlynn Perches. The patient was seen in room APA10/APA10. Patient's care was started at 0912.    CSN: [id number]  Arrival date & time 11/13/11  4132   First MD Initiated Contact with Patient 11/13/11 1009      Chief Complaint  Patient presents with  . Neck Injury  . Back Pain    (Consider location/radiation/quality/duration/timing/severity/associated sxs/prior treatment) HPI  [name] is a 71 y.o. male who presents to the Emergency Department complaining of one month of sudden onset, gradually worsening back pain localized to the left lumbar region, radiating down the left leg with associated neck pain and difficulty walking. Pt reports that pain began a month ago after a fall and has been worsening over the past 3 days. Pt reports that the pain makes it difficult to walk due to decreased strength and leg pain. Pt states that he was prescribed oxycodone by his PCP ([name]) and took it as prescribed without much relief. Pt has been seeing Dr. Jeral Fruit in [place] for neck and back problems. He last saw Dr. Jeral Fruit "a few weeks ago" and is waiting for insurance to approve an MRI.  Pt also is wearing a c-collar for a C7 vertebrae injury. Pt is also seen by Dr. Romeo Apple for an infection and is given vancomycin via IV daily. Pt denies appetite changes and fever. Pt lives with his wife and reports that she can assist him in treatment. Pt has a h/o HTN, hyperlipidemia, and coronary artery disease. Pt is a former smoker and denies alcohol use.  Past Medical History  Diagnosis Date  . HTN (hypertension)   . Hyperlipidemia   . Coronary artery disease     IN 2000   STENT PLACED IN 2012  . Hx of cardiac cath     STENT PLACED IN 2012  . Small bowel problem     HAD ALOT OF SCAR TISSUE FROM PREVIOUS SURGERIES..NG WAS INSERTED ...Marland KitchenMarland KitchenNO SURGERY NEEDED...Marland KitchenMarland KitchenIN FOR 8 DAYS  . Disorder of blood     BEEN TREATED BY DERMATOLOGIST X 4  YRS..."BLOOD BLISTERS"    Past Surgical History  Procedure Date  . Right shoulder dr Romeo Apple     open cuff repair   . Coronary stent placement   . Coronary artery bypass graft   . Sternal surg     HAS HAD 5-6 ON HIS STERNUM  . Coronary angioplasty   . Cataracts     BILATERAL  . Tonsillectomy   . Cholecystectomy   . Lumbar laminectomy/decompression microdiscectomy 06/13/2011    Procedure: LUMBAR LAMINECTOMY/DECOMPRESSION MICRODISCECTOMY;  Surgeon: Karn Cassis;  Location: [facility];  Service: Neurosurgery;  Laterality: N/A;  Lumbar three, lumbar four-five Laminectomy  . I/d right shoulder      failed cuff repair     Family History  Problem Relation Age of Onset  . Heart disease    . Hypertension Mother   . Hypertension Maternal Aunt   . Hypertension Maternal Uncle     History  Substance Use Topics  . Smoking status: Former Smoker -- 1.0 packs/day for 1 years    Types: Cigarettes    Quit date: 12/27/1998  . Smokeless tobacco: Not on file  . Alcohol Use: No      Review of Systems 10 Systems reviewed and all are negative for acute change except as noted in the HPI.  Allergies  Metoprolol and Morphine  Home Medications  Current Outpatient Rx  Name Route Sig Dispense Refill  . ASPIRIN EC 81 MG PO TBEC Oral Take 81 mg by mouth daily.    . ATORVASTATIN CALCIUM 40 MG PO TABS Oral Take 20 mg by mouth at bedtime.     Marland Kitchen CITALOPRAM HYDROBROMIDE 20 MG PO TABS Oral Take 20 mg by mouth daily.     Marland Kitchen DOXAZOSIN MESYLATE 8 MG PO TABS Oral Take 8 mg by mouth at bedtime.     Marland Kitchen PREDNISONE 20 MG PO TABS Oral Take 20 mg by mouth daily.    Marland Kitchen NITROGLYCERIN 0.4 MG SL SUBL Sublingual Place 1 tablet (0.4 mg total) under the tongue every 5 (five) minutes as needed. 25 tablet 3    Triage Vitals: BP 113/101  Pulse 66  Temp(Src) 98.8 F (37.1 C) (Oral)  Resp 20  Ht 6' (1.829 m)  Wt 205 lb (92.987 kg)  BMI 27.80 kg/m2  SpO2 96%  Physical Exam  Nursing note and vitals  reviewed. Constitutional: He is oriented to person, place, and time. He appears well-developed and well-nourished.  HENT:  Head: Normocephalic and atraumatic.  Right Ear: External ear normal.  Left Ear: External ear normal.  Eyes: Conjunctivae and EOM are normal. Pupils are equal, round, and reactive to light.  Neck: Normal range of motion and phonation normal. Neck supple.       Mild tenderness on cervical spine. No deformity or step off  Cardiovascular: Normal rate, regular rhythm, normal heart sounds and intact distal pulses.   Pulmonary/Chest: Effort normal and breath sounds normal. He exhibits no bony tenderness.  Abdominal: Soft. Normal appearance. There is no tenderness.  Musculoskeletal: Normal range of motion.       Strength normal of arms and legs bilaterally. Tender left lumbar through left buttock  Neurological: He is alert and oriented to person, place, and time. He has normal strength. No cranial nerve deficit or sensory deficit. He exhibits normal muscle tone. Coordination normal.       Steady gait.  Skin: Skin is warm, dry and intact.       Well-healed surgical scar on anterior right shoulder. No erythema or swelling of anterior right shoulder.  Psychiatric: He has a normal mood and affect. His behavior is normal. Judgment and thought content normal.    ED Course  Procedures (including critical care time)  DIAGNOSTIC STUDIES: Oxygen Saturation is 96% on room air, adequate by my interpretation.    COORDINATION OF CARE: 10:21AM - Will discuss pt condition with Dr. Jeral Fruit. Patient understands and agrees with initial ED impression and plan with expectations set for ED visit. 10:40AM- Consult complete with Dr. Jeral Fruit. Patient case explained and discussed.   14:00- MRI cervical and lumbar spines have been completed. I discussed the findings with the on-call Neuro radiologist. The patient does not have any acute changes related to the cervical spine fracture. He has a  right-sided disc at C7. That could be causing right hand dysesthesia or tricep weakness; but these are not clinically evident. The lumbar spine. Findings show progression of disease and foraminal narrowing that may need a surgical decompression.  Labs Reviewed - No data to display Mr Lumbar Spine Wo Contrast  11/13/2011  *RADIOLOGY REPORT*  Clinical Data: Acute low back pain extending into the left leg for 3 days.  No recent injury.  History of back surgery in November 2012.  MRI LUMBAR SPINE WITHOUT CONTRAST  Technique:  Multiplanar and multiecho pulse sequences of the lumbar spine were obtained  without intravenous contrast.  Comparison: Preoperative lumbar MRI 06/03/2011.  Postoperative abdominal CT 07/16/2011.  Findings: The patient has undergone bilateral laminectomies from L2- L3 through L4-L5.  The postsurgical changes within the posterior paraspinal soft tissues have improved from the prior CT.  There is no significant residual focal fluid collection.  An anterolisthesis at L4-L5 has slightly increased, now 3 mm.  There is a stable retrolisthesis at L5-S1.  The conus medullaris extends to the L1 level and appears normal. There are no anterior paraspinal abnormalities.  L1-L2:  Stable anterior osteophytes.  No spinal stenosis or nerve root encroachment.  L2-L3:  Stable annular disc bulging with anterior osteophytes.  The spinal canal is adequately decompressed by the prior laminectomies. There is mild bilateral facet hypertrophy.  The foramina appear sufficiently patent.  L3-L4:  Stable annular disc bulge with anterior osteophytes.  The spinal canal is adequately decompressed by the prior laminectomies. Bilateral facet hypertrophy contributes to mild residual lateral recess and foraminal stenosis.  There is no definite nerve root encroachment.  L4-L5:  There is incomplete decompression of the spinal canal by the previous laminectomies.  There is progressive disc degeneration with a probable left foraminal  disc extrusion.  There is severe left foraminal stenosis with probable left L4 nerve root encroachment.  Progressive facet hypertrophy on the left may contribute to the mass effect.  There are small synovial cyst adjacent to the facet joints bilaterally, not appearing to directly communicate to any nerve root encroachment.  The right foramen at both lateral recesses are mildly narrowed.  L5-S1:  Stable chronic degenerative disc disease with paraspinal osteophytes and vacuum phenomenon.  There is moderate bilateral facet hypertrophy.  Chronic foraminal narrowing bilaterally appears unchanged with probable chronic bilateral L5 nerve root encroachment.  IMPRESSION:  1.  Expected postsurgical changes status post L2-5 laminectomy. The postsurgical findings have improved compared with the postoperative CT. 2.  Progressive left foraminal stenosis at L4-L5, now severe.  This is due to a left foraminal disc extrusion and worsening left-sided facet hypertrophy. Left L4 nerve root encroachment is likely, and this appears to be the best explanation for the patient's current symptoms.  In addition, there is mild to moderate residual central stenosis. 3.  Stable chronic biforaminal stenosis at L5-S1 secondary to osteophytes and facet hypertrophy. 4.  Adequately decompressed spinal canal at L2-L3 and L3-L4 status post laminectomy.  Original Report Authenticated By: Gerrianne Scale, M.D.   Mr Cervical Spine W Wo Contrast  11/13/2011  *RADIOLOGY REPORT*  Clinical Data: Posterior neck right shoulder and arm pain.  Status post fall 10/20/2011.  MRI CERVICAL SPINE WITHOUT AND WITH CONTRAST  Technique:  Multiplanar and multiecho pulse sequences of the cervical spine, to include the craniocervical junction and cervicothoracic junction, were obtained according to standard protocol without and with intravenous contrast.  Comparison: MRI cervical spine 10/21/2011.  CT cervical spine 10/20/2011  Findings: Acute C7 vertebral body  fracture with retropulsion is redemonstrated, with slight further depression of the superior endplate and slight further retropulsion, somewhat greater on the right.  Increased bone marrow edema.  Mild post contrast enhancement likely reflects post traumatic sequelae rather than superimposed infection, although a superimposed infection cannot completely be excluded.  No epidural enhancement. Minimal prevertebral edema.  Mild C6-7 annular bulging without frank protrusion.  Slight effacement anterior subarachnoid space at the C6-7 level with slight right-sided neural foraminal narrowing likely contributes to the patient's radicular symptoms.  No new fractures.  No cord edema.  No epidural hematoma.  IMPRESSION: Acute C7 vertebral body fracture has flattened slightly in comparison with 10/21/2011.  There is slight further predominately right-sided retropulsion narrowing the foramen which could contribute to the patient's radicular symptoms.  No new fracture. No strong evidence for superimposed infection.  Original Report Authenticated By: Elsie Stain, M.D.     1. Sciatica   2. Cervical vertebral fracture   3. Degenerative joint disease       MDM  Back and leg pain, consistent with sciatica. Doubt cauda equina, discitis or occult infection. Patient stable for discharge with outpatient management and followup. I have arranged followup with his neurosurgeon for tomorrow  Plan: Home Medications- usual; Home Treatments- heat to back; Recommended follow up- [facility] tomorrow    I personally performed the services described in this documentation, which was scribed in my presence. The recorded information has been reviewed and considered.     Flint Melter, MD 11/13/11 [phone]

## 2011-11-13 NOTE — ED Notes (Signed)
Flushed Picc line with Sterile Saline.

## 2011-11-13 NOTE — Discharge Instructions (Signed)
Sciatica Sciatica is a weakness and/or changes in sensation (tingling, jolts, hot and cold, numbness) along the path the sciatic nerve travels. Irritation or damage to lumbar nerve roots is often also referred to as lumbar radiculopathy.  Lumbar radiculopathy (Sciatica) is the most common form of this problem. Radiculopathy can occur in any of the nerves coming out of the spinal cord. The problems caused depend on which nerves are involved. The sciatic nerve is the large nerve supplying the branches of nerves going from the hip to the toes. It often causes a numbness or weakness in the skin and/or muscles that the sciatic nerve serves. It also may cause symptoms (problems) of pain, burning, tingling, or electric shock-like feelings in the path of this nerve. This usually comes from injury to the fibers that make up the sciatic nerve. Some of these symptoms are low back pain and/or unpleasant feelings in the following areas:  From the mid-buttock down the back of the leg to the back of the knee.   And/or the outside of the calf and top of the foot.   And/or behind the inner ankle to the sole of the foot.  CAUSES   Herniated or slipped disc. Discs are the little cushions between the bones in the back.   Pressure by the piriformis muscle in the buttock on the sciatic nerve (Piriformis Syndrome).   Misalignment of the bones in the lower back and buttocks (Sacroiliac Joint Derangement).   Narrowing of the spinal canal that puts pressure on or pinches the fibers that make up the sciatic nerve.   A slipped vertebra that is out of line with those above or beneath it.   Abnormality of the nervous system itself so that nerve fibers do not transmit signals properly, especially to feet and calves (neuropathy).   Tumor (this is rare).  Your caregiver can usually determine the cause of your sciatica and begin the treatment most likely to help you. TREATMENT  Taking over-the-counter painkillers, physical  therapy, rest, exercise, spinal manipulation, and injections of anesthetics and/or steroids may be used. Surgery, acupuncture, and Yoga can also be effective. Mind over matter techniques, mental imagery, and changing factors such as your bed, chair, desk height, posture, and activities are other treatments that may be helpful. You and your caregiver can help determine what is best for you. With proper diagnosis, the cause of most sciatica can be identified and removed. Communication and cooperation between your caregiver and you is essential. If you are not successful immediately, do not be discouraged. With time, a proper treatment can be found that will make you comfortable. HOME CARE INSTRUCTIONS   If the pain is coming from a problem in the back, applying ice to that area for 15 to 20 minutes, 3 to 4 times per day while awake, may be helpful. Put the ice in a plastic bag. Place a towel between the bag of ice and your skin.   You may exercise or perform your usual activities if these do not aggravate your pain, or as suggested by your caregiver.   Only take over-the-counter or prescription medicines for pain, discomfort, or fever as directed by your caregiver.   If your caregiver has given you a follow-up appointment, it is very important to keep that appointment. Not keeping the appointment could result in a chronic or permanent injury, pain, and disability. If there is any problem keeping the appointment, you must call back to this facility for assistance.  SEEK IMMEDIATE MEDICAL CARE   IF:   You experience loss of control of bowel or bladder.   You have increasing weakness in the trunk, buttocks, or legs.   There is numbness in any areas from the hip down to the toes.   You have difficulty walking or keeping your balance.   You have any of the above, with fever or forceful vomiting.  Document Released: 07/08/2001 Document Revised: 07/03/2011 Document Reviewed: 02/25/2008 [facility] Patient  Information 2012 [place], [place].Arthritis, Nonspecific Arthritis is inflammation of a joint. This usually means pain, redness, warmth or swelling are present. One or more joints may be involved. There are a number of types of arthritis. Your caregiver may not be able to tell what type of arthritis you have right away. CAUSES  The most common cause of arthritis is the wear and tear on the joint (osteoarthritis). This causes damage to the cartilage, which can break down over time. The knees, hips, back and neck are most often affected by this type of arthritis. Other types of arthritis and common causes of joint pain include:  Sprains and other injuries near the joint. Sometimes minor sprains and injuries cause pain and swelling that develop hours later.   Rheumatoid arthritis. This affects hands, feet and knees. It usually affects both sides of your body at the same time. It is often associated with chronic ailments, fever, weight loss and general weakness.   Crystal arthritis. Gout and pseudo gout can cause occasional acute severe pain, redness and swelling in the foot, ankle, or knee.   Infectious arthritis. Bacteria can get into a joint through a break in overlying skin. This can cause infection of the joint. Bacteria and viruses can also spread through the blood and affect your joints.   Drug, infectious and allergy reactions. Sometimes joints can become mildly painful and slightly swollen with these types of illnesses.  SYMPTOMS   Pain is the main symptom.   Your joint or joints can also be red, swollen and warm or hot to the touch.   You may have a fever with certain types of arthritis, or even feel overall ill.   The joint with arthritis will hurt with movement. Stiffness is present with some types of arthritis.  DIAGNOSIS  Your caregiver will suspect arthritis based on your description of your symptoms and on your exam. Testing may be needed to find the type of arthritis:  Blood and  sometimes urine tests.   X-ray tests and sometimes CT or MRI scans.   Removal of fluid from the joint (arthrocentesis) is done to check for bacteria, crystals or other causes. Your caregiver (or a specialist) will numb the area over the joint with a local anesthetic, and use a needle to remove joint fluid for examination. This procedure is only minimally uncomfortable.   Even with these tests, your caregiver may not be able to tell what kind of arthritis you have. Consultation with a specialist (rheumatologist) may be helpful.  TREATMENT  Your caregiver will discuss with you treatment specific to your type of arthritis. If the specific type cannot be determined, then the following general recommendations may apply. Treatment of severe joint pain includes:  Rest.   Elevation.   Anti-inflammatory medication (for example, ibuprofen) may be prescribed. Avoiding activities that cause increased pain.   Only take over-the-counter or prescription medicines for pain and discomfort as recommended by your caregiver.   Cold packs over an inflamed joint may be used for 10 to 15 minutes every hour. Hot packs sometimes  feel better, but do not use overnight. Do not use hot packs if you are diabetic without your caregiver's permission.   A cortisone shot into arthritic joints may help reduce pain and swelling.   Any acute arthritis that gets worse over the next 1 to 2 days needs to be looked at to be sure there is no joint infection.  Long-term arthritis treatment involves modifying activities and lifestyle to reduce joint stress jarring. This can include weight loss. Also, exercise is needed to nourish the joint cartilage and remove waste. This helps keep the muscles around the joint strong. HOME CARE INSTRUCTIONS   Do not take aspirin to relieve pain if gout is suspected. This elevates uric acid levels.   Only take over-the-counter or prescription medicines for pain, discomfort or fever as directed by  your caregiver.   Rest the joint as much as possible.   If your joint is swollen, keep it elevated.   Use crutches if the painful joint is in your leg.   Drinking plenty of fluids may help for certain types of arthritis.   Follow your caregiver's dietary instructions.   Try low-impact exercise such as:   Swimming.   Water aerobics.   Biking.   Walking.   Morning stiffness is often relieved by a warm shower.   Put your joints through regular range-of-motion.  SEEK MEDICAL CARE IF:   You do not feel better in 24 hours or are getting worse.   You have side effects to medications, or are not getting better with treatment.  SEEK IMMEDIATE MEDICAL CARE IF:   You have a fever.   You develop severe joint pain, swelling or redness.   Many joints are involved and become painful and swollen.   There is severe back pain and/or leg weakness.   You have loss of bowel or bladder control.  Document Released: 08/21/2004 Document Revised: 07/03/2011 Document Reviewed: 09/06/2008 [facility] Patient Information 2012 [place], [place].

## 2011-11-13 NOTE — ED Notes (Signed)
Pt back from MRI 

## 2011-11-13 NOTE — ED Notes (Signed)
Pt states having severe pain since yesterday in neck,back, leg from fall one month ago. Seeing neurosurgeon for vertebrae causing impediment on nerve.

## 2011-11-13 NOTE — ED Notes (Signed)
Pt not ready for discharge. Antibiotic still infusing 

## 2011-11-14 ENCOUNTER — Encounter (HOSPITAL_COMMUNITY): Payer: Medicare Other

## 2011-11-14 VITALS — BP 124/70 | HR 61 | Temp 98.1°F

## 2011-11-14 MED ORDER — VANCOMYCIN HCL 1000 MG IV SOLR
1500.0000 mg | INTRAVENOUS | Status: DC
  Administered 2011-11-14: 1500 mg via INTRAVENOUS
  Filled 2011-11-14 (×2): qty 1500

## 2011-11-14 MED ORDER — HEPARIN SOD (PORK) LOCK FLUSH 100 UNIT/ML IV SOLN: 500.0000 [IU] | Freq: Once | INTRAVENOUS | Status: DC

## 2011-11-14 MED ORDER — SODIUM CHLORIDE 0.9 % IJ SOLN
10.0000 mL | INTRAMUSCULAR | Status: DC | PRN
  Administered 2011-11-14: 10 mL via INTRAVENOUS

## 2011-11-14 MED ORDER — SODIUM CHLORIDE 0.9 % IV SOLN
INTRAVENOUS | Status: DC
  Administered 2011-11-14: 11:00:00 via INTRAVENOUS

## 2011-11-14 NOTE — Progress Notes (Signed)
Tolerated infusion well. 

## 2011-11-15 ENCOUNTER — Encounter (HOSPITAL_COMMUNITY): Payer: Medicare Other

## 2011-11-15 VITALS — BP 176/83 | HR 64 | Temp 97.5°F

## 2011-11-15 DIAGNOSIS — A4902 Methicillin resistant Staphylococcus aureus infection, unspecified site: Secondary | ICD-10-CM

## 2011-11-15 MED ORDER — SODIUM CHLORIDE 0.9 % IV SOLN
INTRAVENOUS | Status: DC
  Administered 2011-11-15: 09:00:00 via INTRAVENOUS

## 2011-11-15 MED ORDER — VANCOMYCIN HCL 1000 MG IV SOLR
1500.0000 mg | INTRAVENOUS | Status: DC
  Administered 2011-11-15: 1500 mg via INTRAVENOUS
  Filled 2011-11-15: qty 1500

## 2011-11-15 MED ORDER — SODIUM CHLORIDE 0.9 % IJ SOLN
10.0000 mL | INTRAMUSCULAR | Status: DC | PRN
  Administered 2011-11-15: 10 mL via INTRAVENOUS

## 2011-11-15 NOTE — Progress Notes (Signed)
Dressing change left upper arm PICC. Site WNL. Tolerated well.

## 2011-11-16 ENCOUNTER — Encounter (HOSPITAL_COMMUNITY): Payer: Medicare Other

## 2011-11-16 VITALS — BP 126/77 | HR 74 | Temp 97.7°F

## 2011-11-16 DIAGNOSIS — A4902 Methicillin resistant Staphylococcus aureus infection, unspecified site: Secondary | ICD-10-CM

## 2011-11-16 MED ORDER — SODIUM CHLORIDE 0.9 % IV SOLN
INTRAVENOUS | Status: DC
  Administered 2011-11-16: 09:00:00 via INTRAVENOUS

## 2011-11-16 MED ORDER — VANCOMYCIN HCL 1000 MG IV SOLR
1500.0000 mg | INTRAVENOUS | Status: DC
  Administered 2011-11-16: 1500 mg via INTRAVENOUS
  Filled 2011-11-16: qty 1500

## 2011-11-16 MED ORDER — SODIUM CHLORIDE 0.9 % IJ SOLN
10.0000 mL | INTRAMUSCULAR | Status: DC | PRN
  Administered 2011-11-16: 10 mL via INTRAVENOUS

## 2011-11-16 NOTE — Progress Notes (Signed)
Tolerated well

## 2011-11-17 ENCOUNTER — Encounter (HOSPITAL_COMMUNITY): Payer: Medicare Other

## 2011-11-17 VITALS — BP 93/58 | HR 73 | Temp 98.0°F

## 2011-11-17 DIAGNOSIS — A4902 Methicillin resistant Staphylococcus aureus infection, unspecified site: Secondary | ICD-10-CM

## 2011-11-17 MED ORDER — SODIUM CHLORIDE 0.9 % IJ SOLN
10.0000 mL | INTRAMUSCULAR | Status: DC | PRN
  Administered 2011-11-17: 10 mL via INTRAVENOUS

## 2011-11-17 MED ORDER — VANCOMYCIN HCL 1000 MG IV SOLR
1500.0000 mg | Freq: Once | INTRAVENOUS | Status: AC
  Administered 2011-11-17: 1500 mg via INTRAVENOUS
  Filled 2011-11-17: qty 1500

## 2011-11-17 MED ORDER — HEPARIN SOD (PORK) LOCK FLUSH 100 UNIT/ML IV SOLN
500.0000 [IU] | Freq: Once | INTRAVENOUS | Status: AC
  Administered 2011-11-17: 300 [IU] via INTRAVENOUS

## 2011-11-17 MED ORDER — SODIUM CHLORIDE 0.9 % IV SOLN
INTRAVENOUS | Status: DC
  Administered 2011-11-17: 09:00:00 via INTRAVENOUS

## 2011-11-17 NOTE — Progress Notes (Signed)
Tolerated vancomycin well. 

## 2011-11-18 ENCOUNTER — Encounter (HOSPITAL_COMMUNITY): Payer: Medicare Other

## 2011-11-18 ENCOUNTER — Encounter: Payer: Self-pay | Admitting: Orthopedic Surgery

## 2011-11-18 ENCOUNTER — Ambulatory Visit (INDEPENDENT_AMBULATORY_CARE_PROVIDER_SITE_OTHER): Payer: Medicare Other | Admitting: Orthopedic Surgery

## 2011-11-18 VITALS — BP 97/58 | HR 77 | Temp 98.0°F

## 2011-11-18 VITALS — BP 100/58 | Ht 72.0 in | Wt 205.0 lb

## 2011-11-18 DIAGNOSIS — M009 Pyogenic arthritis, unspecified: Secondary | ICD-10-CM

## 2011-11-18 DIAGNOSIS — A4902 Methicillin resistant Staphylococcus aureus infection, unspecified site: Secondary | ICD-10-CM

## 2011-11-18 MED ORDER — VANCOMYCIN HCL 1000 MG IV SOLR
1500.0000 mg | Freq: Once | INTRAVENOUS | Status: AC
  Administered 2011-11-18: 1500 mg via INTRAVENOUS
  Filled 2011-11-18: qty 1500

## 2011-11-18 MED ORDER — SODIUM CHLORIDE 0.9 % IV SOLN
INTRAVENOUS | Status: DC
  Administered 2011-11-18: 10:00:00 via INTRAVENOUS

## 2011-11-18 NOTE — Progress Notes (Signed)
Patient ID: [name], male   DOB: 1941/01/08, 71 y.o.   MRN: [id number] Chief Complaint  Patient presents with  . Follow-up    recheck right shoulder   [name] is a 71 y.o. male who presents with pain, swelling RIGHT shoulder. The patient has had multiple procedures on the RIGHT shoulder. He had an attempt at repair of a massive rotator cuff tear which was unsuccessful and complicated by infection which was treated with irrigation debridement and IV antibiotics after several attempts at aspiration and oral antibiotics.  His infection cleared and he was doing fairly well until a recent fall. He fell and fractured his cervical spine and will require surgery. However there is concerned because of the recent onset of pain and swelling in the RIGHT shoulder with redness loss of motion and pain. On April 6 after 24 hours of increasing pain he went to the emergency room and had an x-ray which was normal white count normal no fever but increasing pain. This is very similar to the last presentation for joint infection at which time he required open debridement  Vancomycin day: 17  The patient's pain is decreased.  His neurosurgeon recommends careful intubation with the neck in extension and he has given [place] a note which I have reviewed with Dr. Jayme Cloud, and we've decided to proceed with surgical irrigation debridement, and culture of the RIGHT shoulder. The antibiotics will run out on Friday and we will culture on Monday, which is appropriate.  This is a difficult clinical situation based on the patient's recurrence of infection. Although it seems that he is clinically improving. Definitive cultures intraoperatively are needed to assure that the infection has been cleared. This is especially important in the light of his knee for surgical intervention regarding his cervical spine and I agree with his neurosurgeon in surgical intervention on the spine with an ongoing potential infection. His  counter productive.  The patient understands the risks of the surgical intervention for the shoulder. Regarding his neck and he understands that we may not be able to proceed with intervention if we cannot get him intubated safely.  Plan is for incision and drainage and culture of the RIGHT shoulder was insertion of drain.  Copies will be sent to the neurosurgeon  preop

## 2011-11-18 NOTE — Progress Notes (Signed)
Per pt - Dr. Romeo Apple said that Friday would be his last dose of Vanco. Tolerated infusion well.

## 2011-11-18 NOTE — Patient Instructions (Signed)
Surgery I/D right shoulder

## 2011-11-19 ENCOUNTER — Encounter (HOSPITAL_COMMUNITY): Payer: Medicare Other

## 2011-11-19 DIAGNOSIS — A4902 Methicillin resistant Staphylococcus aureus infection, unspecified site: Secondary | ICD-10-CM

## 2011-11-19 MED ORDER — HEPARIN SOD (PORK) LOCK FLUSH 100 UNIT/ML IV SOLN
500.0000 [IU] | Freq: Once | INTRAVENOUS | Status: AC
  Administered 2011-11-19: 500 [IU] via INTRAVENOUS

## 2011-11-19 MED ORDER — SODIUM CHLORIDE 0.9 % IV SOLN
INTRAVENOUS | Status: DC
  Administered 2011-11-19: 500 mL via INTRAVENOUS

## 2011-11-19 MED ORDER — VANCOMYCIN HCL 1000 MG IV SOLR
1500.0000 mg | Freq: Once | INTRAVENOUS | Status: AC
  Administered 2011-11-19: 1500 mg via INTRAVENOUS
  Filled 2011-11-19: qty 1500

## 2011-11-19 NOTE — Patient Instructions (Addendum)
20 Marek Nghiem [facility]  11/19/2011   Your procedure is scheduled on:  April 29  Report to [facility] at 1200 AM.  Call this number if you have problems the morning of surgery: [phone]   Remember:   Do not eat food:After Midnight.  May have clear liquids:until Midnight .  Clear liquids include soda, tea, black coffee, apple or grape juice, broth.  Take these medicines the morning of surgery with A SIP OF WATER: xanax, celexa, cardura, pain pill   Do not wear jewelry, make-up or nail polish.  Do not wear lotions, powders, or perfumes. You may wear deodorant.  Do not shave 48 hours prior to surgery.  Do not bring valuables to the hospital.  Contacts, dentures or bridgework may not be worn into surgery.  Leave suitcase in the car. After surgery it may be brought to your room.  For patients admitted to the hospital, checkout time is 11:00 AM the day of discharge.   Patients discharged the day of surgery will not be allowed to drive home.  Name and phone number of your driver: family  Special Instructions: N/A   Please read over the following fact sheets that you were given: Pain Booklet, MRSA Information, Surgical Site Infection Prevention, Anesthesia Post-op Instructions and Care and Recovery After Surgery   PATIENT INSTRUCTIONS POST-ANESTHESIA  IMMEDIATELY FOLLOWING SURGERY:  Do not drive or operate machinery for the first twenty four hours after surgery.  Do not make any important decisions for twenty four hours after surgery or while taking narcotic pain medications or sedatives.  If you develop intractable nausea and vomiting or a severe headache please notify your doctor immediately.  FOLLOW-UP:  Please make an appointment with your surgeon as instructed. You do not need to follow up with anesthesia unless specifically instructed to do so.  WOUND CARE INSTRUCTIONS (if applicable):  Keep a dry clean dressing on the anesthesia/puncture wound site if there is drainage.  Once the wound  has quit draining you may leave it open to air.  Generally you should leave the bandage intact for twenty four hours unless there is drainage.  If the epidural site drains for more than 36-48 hours please call the anesthesia department.  QUESTIONS?:  Please feel free to call your physician or the hospital operator if you have any questions, and they will be happy to assist you.     [facility] Anesthesia Department [address] [place] [phone]    Incision and Drainage Incision and drainage (I&D) is a procedure in which a cavity-like structure (cystic structure) is opened and drained. The cyst to be drained usually contains material such as pus, fluid, or blood. Gauze is sometimes packed into the cut (incision). Keeping a drain or piece of gauze in the incision keeps the skin from healing first. This helps stop the cyst from forming again. HOME CARE INSTRUCTIONS   Only take over-the-counter or prescription medicines for pain, discomfort, or fever as directed by your caregiver. Use these only if your caregiver has not given medicines that would interfere.   See your caregiver as directed for a recheck.   If medicines (antibiotics) that kill germs were prescribed, take them as directed.  SEEK MEDICAL CARE IF:   You develop increased pain, swelling, redness, drainage, or bleeding in the wound.   You develop signs of an infection. These signs include muscle aches, chills, or a general ill feeling.   You have a fever.  MAKE SURE YOU:  Understand these instructions.   Will watch your condition.   Will get help right away if you are not doing well or get worse.  Document Released: 01/07/2001 Document Revised: 07/03/2011 Document Reviewed: 03/03/2008 [facility] Patient Information 2012 [place], [place].

## 2011-11-19 NOTE — Progress Notes (Signed)
[name] tolerated infusion well and without incident; verbalizes understanding for follow-up.  No distress noted at time of discharge and patient was discharged home by himself.

## 2011-11-20 ENCOUNTER — Encounter (HOSPITAL_COMMUNITY): Payer: Self-pay

## 2011-11-20 ENCOUNTER — Other Ambulatory Visit: Payer: Self-pay

## 2011-11-20 ENCOUNTER — Telehealth: Payer: Self-pay | Admitting: Orthopedic Surgery

## 2011-11-20 ENCOUNTER — Encounter (HOSPITAL_COMMUNITY)
Admission: RE | Admit: 2011-11-20 | Discharge: 2011-11-20 | Disposition: A | Discharge status: Home / Self Care | Payer: Medicare Other | Source: Ambulatory Visit | Attending: Orthopedic Surgery | Admitting: Orthopedic Surgery

## 2011-11-20 ENCOUNTER — Encounter (HOSPITAL_COMMUNITY): Payer: Medicare Other

## 2011-11-20 VITALS — BP 126/72 | HR 67 | Temp 97.5°F

## 2011-11-20 DIAGNOSIS — A4902 Methicillin resistant Staphylococcus aureus infection, unspecified site: Secondary | ICD-10-CM

## 2011-11-20 HISTORY — DX: Unspecified osteoarthritis, unspecified site: M19.90

## 2011-11-20 HISTORY — DX: Anxiety disorder, unspecified: F41.9

## 2011-11-20 LAB — BASIC METABOLIC PANEL
BUN: 20 mg/dL (ref 6–23)
Calcium: 9.6 mg/dL (ref 8.4–10.5)
Chloride: 103 mEq/L (ref 96–112)
Creatinine, Ser: 1.18 mg/dL (ref 0.50–1.35)
GFR calc Af Amer: 71 mL/min — ABNORMAL LOW (ref >90)
GFR calc non Af Amer: 62 mL/min — ABNORMAL LOW (ref >90)

## 2011-11-20 LAB — HEMOGLOBIN AND HEMATOCRIT, BLOOD
HCT: 35.4 % — ABNORMAL LOW (ref 39.0–52.0)
Hemoglobin: 11.3 g/dL — ABNORMAL LOW (ref 13.0–17.0)

## 2011-11-20 MED ORDER — VANCOMYCIN HCL 1000 MG IV SOLR
1500.0000 mg | Freq: Once | INTRAVENOUS | Status: AC
  Administered 2011-11-20: 1500 mg via INTRAVENOUS
  Filled 2011-11-20: qty 1500

## 2011-11-20 MED ORDER — SODIUM CHLORIDE 0.9 % IV SOLN
Freq: Once | INTRAVENOUS | Status: AC
  Administered 2011-11-20: 10:00:00 via INTRAVENOUS

## 2011-11-20 MED ORDER — HEPARIN SOD (PORK) LOCK FLUSH 100 UNIT/ML IV SOLN
500.0000 [IU] | Freq: Once | INTRAVENOUS | Status: AC
  Administered 2011-11-20: 300 [IU] via INTRAVENOUS

## 2011-11-20 NOTE — Telephone Encounter (Signed)
Contacted insurer, [employer] at [phone] re: out-patient surgery scheduled at [facility] 11/24/11, CPT 23030, [phone].  Per Marissa A, in care-notification/national intake department, no pre-authorization required for out-patient procedures for in-network providers.  Her name and today's date for reference.

## 2011-11-20 NOTE — Pre-Procedure Instructions (Signed)
Patient in for preop visit wearin cervical collar due to fractured neck. Dr Jayme Cloud notified and in to see patient. No orders given.

## 2011-11-20 NOTE — Progress Notes (Signed)
Tolerated well

## 2011-11-21 ENCOUNTER — Encounter (HOSPITAL_COMMUNITY): Payer: Medicare Other

## 2011-11-21 DIAGNOSIS — A4902 Methicillin resistant Staphylococcus aureus infection, unspecified site: Secondary | ICD-10-CM

## 2011-11-21 MED ORDER — VANCOMYCIN HCL 1000 MG IV SOLR
1500.0000 mg | Freq: Once | INTRAVENOUS | Status: AC
  Administered 2011-11-21: 1500 mg via INTRAVENOUS
  Filled 2011-11-21: qty 1500

## 2011-11-21 MED ORDER — SODIUM CHLORIDE 0.9 % IJ SOLN
INTRAMUSCULAR | Status: AC
  Filled 2011-11-21: qty 10

## 2011-11-21 MED ORDER — SODIUM CHLORIDE 0.9 % IV SOLN
INTRAVENOUS | Status: DC
  Administered 2011-11-21: 250 mL via INTRAVENOUS

## 2011-11-21 MED ORDER — HEPARIN SOD (PORK) LOCK FLUSH 100 UNIT/ML IV SOLN
500.0000 [IU] | Freq: Once | INTRAVENOUS | Status: AC
  Administered 2011-11-21: 500 [IU] via INTRAVENOUS

## 2011-11-21 MED ORDER — HEPARIN SOD (PORK) LOCK FLUSH 100 UNIT/ML IV SOLN
INTRAVENOUS | Status: AC
  Filled 2011-11-21: qty 5

## 2011-11-21 MED ORDER — SODIUM CHLORIDE 0.9 % IJ SOLN
10.0000 mL | INTRAMUSCULAR | Status: DC | PRN
  Administered 2011-11-21: 10 mL via INTRAVENOUS

## 2011-11-21 NOTE — Progress Notes (Signed)
[name] tolerated infusion well and without incident; verbalizes understanding for follow-up.  Dressing change for PICC on right arm performed.  No distress noted at time of discharge and patient was discharged home by himself.

## 2011-11-24 ENCOUNTER — Ambulatory Visit (HOSPITAL_COMMUNITY): Payer: Medicare Other | Admitting: Anesthesiology

## 2011-11-24 ENCOUNTER — Encounter (HOSPITAL_COMMUNITY): Payer: Self-pay | Admitting: Anesthesiology

## 2011-11-24 ENCOUNTER — Ambulatory Visit (HOSPITAL_COMMUNITY)
Admission: RE | Admit: 2011-11-24 | Discharge: 2011-11-24 | Disposition: A | Discharge status: Home / Self Care | Payer: Medicare Other | Source: Ambulatory Visit | Attending: Orthopedic Surgery | Admitting: Orthopedic Surgery

## 2011-11-24 ENCOUNTER — Encounter (HOSPITAL_COMMUNITY): Payer: Medicare Other

## 2011-11-24 ENCOUNTER — Encounter (HOSPITAL_COMMUNITY): Payer: Self-pay | Admitting: *Deleted

## 2011-11-24 ENCOUNTER — Encounter (HOSPITAL_COMMUNITY)
Admission: RE | Disposition: A | Discharge status: Home / Self Care | Payer: Self-pay | Source: Ambulatory Visit | Attending: Orthopedic Surgery

## 2011-11-24 DIAGNOSIS — J4489 Other specified chronic obstructive pulmonary disease: Secondary | ICD-10-CM | POA: Insufficient documentation

## 2011-11-24 DIAGNOSIS — Z951 Presence of aortocoronary bypass graft: Secondary | ICD-10-CM | POA: Insufficient documentation

## 2011-11-24 DIAGNOSIS — M009 Pyogenic arthritis, unspecified: Secondary | ICD-10-CM

## 2011-11-24 DIAGNOSIS — E785 Hyperlipidemia, unspecified: Secondary | ICD-10-CM | POA: Insufficient documentation

## 2011-11-24 DIAGNOSIS — J449 Chronic obstructive pulmonary disease, unspecified: Secondary | ICD-10-CM | POA: Insufficient documentation

## 2011-11-24 DIAGNOSIS — Z01812 Encounter for preprocedural laboratory examination: Secondary | ICD-10-CM | POA: Insufficient documentation

## 2011-11-24 DIAGNOSIS — Z01810 Encounter for preprocedural cardiovascular examination: Secondary | ICD-10-CM | POA: Insufficient documentation

## 2011-11-24 DIAGNOSIS — I10 Essential (primary) hypertension: Secondary | ICD-10-CM | POA: Insufficient documentation

## 2011-11-24 SURGERY — IRRIGATION AND DEBRIDEMENT SHOULDER
Anesthesia: General | Site: Shoulder | Laterality: Right | Wound class: Contaminated

## 2011-11-24 MED ORDER — CEFAZOLIN SODIUM-DEXTROSE 2-3 GM-% IV SOLR
INTRAVENOUS | Status: AC
  Filled 2011-11-24: qty 50

## 2011-11-24 MED ORDER — LACTATED RINGERS IV SOLN
INTRAVENOUS | Status: DC
  Administered 2011-11-24: 1000 mL via INTRAVENOUS

## 2011-11-24 MED ORDER — PROPOFOL 10 MG/ML IV EMUL
INTRAVENOUS | Status: AC
  Filled 2011-11-24: qty 20

## 2011-11-24 MED ORDER — BUPIVACAINE-EPINEPHRINE PF 0.5-1:200000 % IJ SOLN
INTRAMUSCULAR | Status: AC
  Filled 2011-11-24: qty 20

## 2011-11-24 MED ORDER — HYDROCODONE-ACETAMINOPHEN 10-325 MG PO TABS: 1.0000 | ORAL_TABLET | ORAL | Status: AC | PRN

## 2011-11-24 MED ORDER — GLYCOPYRROLATE 0.2 MG/ML IJ SOLN
INTRAMUSCULAR | Status: AC
  Administered 2011-11-24: 0.2 mg via INTRAVENOUS
  Filled 2011-11-24: qty 1

## 2011-11-24 MED ORDER — HEPARIN SOD (PORK) LOCK FLUSH 100 UNIT/ML IV SOLN
INTRAVENOUS | Status: AC
  Administered 2011-11-24: 500 [IU]
  Filled 2011-11-24: qty 5

## 2011-11-24 MED ORDER — FENTANYL CITRATE 0.05 MG/ML IJ SOLN
INTRAMUSCULAR | Status: AC
  Administered 2011-11-24: 50 ug via INTRAVENOUS
  Filled 2011-11-24: qty 2

## 2011-11-24 MED ORDER — ONDANSETRON HCL 4 MG/2ML IJ SOLN
INTRAMUSCULAR | Status: AC
  Administered 2011-11-24: 4 mg via INTRAVENOUS
  Filled 2011-11-24: qty 2

## 2011-11-24 MED ORDER — EPHEDRINE SULFATE 50 MG/ML IJ SOLN
INTRAMUSCULAR | Status: AC
  Filled 2011-11-24: qty 1

## 2011-11-24 MED ORDER — ONDANSETRON HCL 4 MG/2ML IJ SOLN: 4.0000 mg | Freq: Once | INTRAMUSCULAR | Status: DC | PRN

## 2011-11-24 MED ORDER — GLYCOPYRROLATE 0.2 MG/ML IJ SOLN
0.2000 mg | Freq: Once | INTRAMUSCULAR | Status: AC
  Administered 2011-11-24: 0.2 mg via INTRAVENOUS

## 2011-11-24 MED ORDER — MIDAZOLAM HCL 2 MG/2ML IJ SOLN
1.0000 mg | INTRAMUSCULAR | Status: DC | PRN
  Administered 2011-11-24: 2 mg via INTRAVENOUS

## 2011-11-24 MED ORDER — CEFAZOLIN SODIUM 1-5 GM-% IV SOLN
INTRAVENOUS | Status: DC | PRN
  Administered 2011-11-24: 2 g via INTRAVENOUS

## 2011-11-24 MED ORDER — PROPOFOL 10 MG/ML IV BOLUS
INTRAVENOUS | Status: DC | PRN
  Administered 2011-11-24: 150 mg via INTRAVENOUS

## 2011-11-24 MED ORDER — CHLORHEXIDINE GLUCONATE 4 % EX LIQD
60.0000 mL | Freq: Once | CUTANEOUS | Status: DC
  Filled 2011-11-24: qty 118

## 2011-11-24 MED ORDER — SODIUM CHLORIDE 0.9 % IR SOLN
Status: DC | PRN
  Administered 2011-11-24: 1000 mL

## 2011-11-24 MED ORDER — SODIUM CHLORIDE 0.9 % IJ SOLN
INTRAMUSCULAR | Status: AC
  Filled 2011-11-24: qty 10

## 2011-11-24 MED ORDER — CEFAZOLIN SODIUM-DEXTROSE 2-3 GM-% IV SOLR: 2.0000 g | INTRAVENOUS | Status: DC

## 2011-11-24 MED ORDER — LIDOCAINE HCL (PF) 1 % IJ SOLN
INTRAMUSCULAR | Status: AC
  Filled 2011-11-24: qty 5

## 2011-11-24 MED ORDER — EPHEDRINE SULFATE 50 MG/ML IJ SOLN
INTRAMUSCULAR | Status: DC | PRN
  Administered 2011-11-24: 10 mg via INTRAVENOUS

## 2011-11-24 MED ORDER — FENTANYL CITRATE 0.05 MG/ML IJ SOLN
25.0000 ug | INTRAMUSCULAR | Status: DC | PRN
  Administered 2011-11-24 (×4): 50 ug via INTRAVENOUS

## 2011-11-24 MED ORDER — ONDANSETRON HCL 4 MG/2ML IJ SOLN
4.0000 mg | Freq: Once | INTRAMUSCULAR | Status: AC
  Administered 2011-11-24: 4 mg via INTRAVENOUS

## 2011-11-24 MED ORDER — LACTATED RINGERS IV SOLN
INTRAVENOUS | Status: DC | PRN
  Administered 2011-11-24: 13:00:00 via INTRAVENOUS

## 2011-11-24 MED ORDER — MIDAZOLAM HCL 2 MG/2ML IJ SOLN
INTRAMUSCULAR | Status: AC
  Administered 2011-11-24: 2 mg via INTRAVENOUS
  Filled 2011-11-24: qty 2

## 2011-11-24 MED ORDER — FENTANYL CITRATE 0.05 MG/ML IJ SOLN
INTRAMUSCULAR | Status: DC | PRN
  Administered 2011-11-24: 50 ug via INTRAVENOUS
  Administered 2011-11-24 (×2): 25 ug via INTRAVENOUS

## 2011-11-24 MED ORDER — LIDOCAINE HCL (CARDIAC) 10 MG/ML IV SOLN
INTRAVENOUS | Status: DC | PRN
  Administered 2011-11-24: 50 mg via INTRAVENOUS

## 2011-11-24 SURGICAL SUPPLY — 33 items
BAG HAMPER (MISCELLANEOUS) ×3 IMPLANT
BANDAGE CONFORM 2  STR LF (GAUZE/BANDAGES/DRESSINGS) IMPLANT
BNDG COHESIVE 4X5 TAN STRL (GAUZE/BANDAGES/DRESSINGS) ×3 IMPLANT
CLOTH BEACON ORANGE TIMEOUT ST (SAFETY) ×3 IMPLANT
COVER SURGICAL LIGHT HANDLE (MISCELLANEOUS) ×6 IMPLANT
DRAPE ORTHO 2.5IN SPLIT 77X108 (DRAPES) ×4 IMPLANT
DRAPE ORTHO SPLIT 77X108 STRL (DRAPES) ×2
DRESSING ALLEVYN BORDER HEEL (GAUZE/BANDAGES/DRESSINGS) ×3 IMPLANT
ELECT REM PT RETURN 9FT ADLT (ELECTROSURGICAL) ×3
ELECTRODE REM PT RTRN 9FT ADLT (ELECTROSURGICAL) ×2 IMPLANT
GLOVE BIOGEL PI IND STRL 7.5 (GLOVE) ×2 IMPLANT
GLOVE BIOGEL PI INDICATOR 7.5 (GLOVE) ×1
GLOVE ECLIPSE 7.0 STRL STRAW (GLOVE) ×3 IMPLANT
GLOVE EXAM NITRILE MD LF STRL (GLOVE) ×3 IMPLANT
GOWN STRL REIN XL XLG (GOWN DISPOSABLE) ×6 IMPLANT
KIT ROOM TURNOVER APOR (KITS) ×3 IMPLANT
MANIFOLD NEPTUNE II (INSTRUMENTS) ×3 IMPLANT
MARKER SKIN DUAL TIP RULER LAB (MISCELLANEOUS) ×3 IMPLANT
NS IRRIG 1000ML POUR BTL (IV SOLUTION) ×3 IMPLANT
PACK BASIC LIMB (CUSTOM PROCEDURE TRAY) IMPLANT
PACK MINOR (CUSTOM PROCEDURE TRAY) ×3 IMPLANT
PAD ABD 5X9 TENDERSORB (GAUZE/BANDAGES/DRESSINGS) IMPLANT
PAD ARMBOARD 7.5X6 YLW CONV (MISCELLANEOUS) ×3 IMPLANT
SET BASIN LINEN APH (SET/KITS/TRAYS/PACK) ×3 IMPLANT
SPONGE GAUZE 4X4 12PLY (GAUZE/BANDAGES/DRESSINGS) IMPLANT
STAPLER VISISTAT 35W (STAPLE) ×3 IMPLANT
STOCKINETTE 6  STRL (DRAPES) ×1
STOCKINETTE 6 STRL (DRAPES) ×2 IMPLANT
SUT MON AB 2-0 SH 27 (SUTURE) ×1
SUT MON AB 2-0 SH27 (SUTURE) ×2 IMPLANT
SWAB CULTURE LIQ STUART DBL (MISCELLANEOUS) ×3 IMPLANT
SYR BULB IRRIGATION 50ML (SYRINGE) ×6 IMPLANT
TUBE ANAEROBIC PORT A CUL  W/M (MISCELLANEOUS) ×3 IMPLANT

## 2011-11-24 NOTE — Op Note (Signed)
Preop diagnosis infection right shoulder  Postop diagnosis same  Procedure irrigation and debridement right shoulder with culture  Surgeon Romeo Apple  Anesthetic Gen. with an LMA Operative findings clean shoulder joint with no functional rotator cuff tissue  Indications for procedure the patient had purulent drainage from the shoulder with aspiration was treated with IV vancomycin however he is scheduled for surgical procedure on his cervical spine and intraoperative deep cultures were needed to confirm infection had cleared prior to cervical spine fusion  The patient's right shoulder was marked for surgery confirmed followed by chart update and consent signing  He was taken to the operating room kept in a cervical collar and LMA was placed  The right shoulder was prepped and draped using Betadine and sterile draping technique  Timeout was completed  The previous skin incision was used this was taken full thickness including the deltoid. This exposed the glenohumeral joint which was cultured, manipulated, and debrided (2 breathing old suture material)  After adequate irrigation the wound was closed with #1 Bralon in 2-0 Monocryl and staples. Sterile dressing was applied  No drain was placed as there was no purulent material the joint was completely clean and there were no residual signs of infection  Postoperative plan the patient will be followed in 48 hours. His cultures will be checked. If he is clinically doing well and his shoulder looks well we will give the okay to Dr. Jeral Fruit for surgical fusion

## 2011-11-24 NOTE — Brief Op Note (Signed)
11/24/2011  2:22 PM  PATIENT:  [name]  71 y.o. male  PRE-OPERATIVE DIAGNOSIS:  right shoulder joint infection  POST-OPERATIVE DIAGNOSIS:  right shoulder joint infection  PROCEDURE:  Procedure(s) (LRB): IRRIGATION AND DEBRIDEMENT SHOULDER (Right)  SURGEON:  Surgeon(s) and Role:    * Vickki Hearing, MD - Primary  PHYSICIAN ASSISTANT:   ASSISTANTS: none   ANESTHESIA:   general LMA   EBL:  Total I/O In: 400 [I.V.:400] Out: -   BLOOD ADMINISTERED:none  DRAINS: none   LOCAL MEDICATIONS USED:  NONE  SPECIMEN:  CULTURE ANAEROBIC AND AEROBIC OFF VANC X 48 HRS / RIGHT SHOULDER JOINT   OP FINDINGS: CLEAN JOINT NO PURULENT MATERIAL , HEAD BARE, NO ROTATOR CUFF INTEGRITY  DISPOSITION OF SPECIMEN:  N/A  COUNTS:  YES  TOURNIQUET:  * No tourniquets in log *  DICTATION: .Dragon Dictation  PLAN OF CARE: Discharge to home after PACU  PATIENT DISPOSITION:  PACU - hemodynamically stable.   Delay start of Pharmacological VTE agent (>24hrs) due to surgical blood loss or risk of bleeding: not applicable

## 2011-11-24 NOTE — Anesthesia Postprocedure Evaluation (Signed)
  Anesthesia Post-op Note  Patient: [name]  Procedure(s) Performed: Procedure(s) (LRB): IRRIGATION AND DEBRIDEMENT SHOULDER (Right)  Patient Location: PACU  Anesthesia Type: General  Level of Consciousness: awake, alert , oriented and patient cooperative  Airway and Oxygen Therapy: Patient Spontanous Breathing and Patient connected to face mask oxygen  Post-op Pain: mild  Post-op Assessment: Post-op Vital signs reviewed, Patient's Cardiovascular Status Stable, Respiratory Function Stable, Patent Airway and No signs of Nausea or vomiting  Post-op Vital Signs: Reviewed and stable  Complications: No apparent anesthesia complications

## 2011-11-24 NOTE — Preoperative (Signed)
Beta Blockers   Reason not to administer Beta Blockers:Not Applicable 

## 2011-11-24 NOTE — Anesthesia Procedure Notes (Signed)
Procedure Name: LMA Insertion Date/Time: 11/24/2011 1:39 PM Performed by: Carolyne Littles, Nickholas Goldston L Pre-anesthesia Checklist: Patient identified, Patient being monitored, Emergency Drugs available, Timeout performed and Suction available Patient Re-evaluated:Patient Re-evaluated prior to inductionOxygen Delivery Method: Circle system utilized Preoxygenation: Pre-oxygenation with 100% oxygen Intubation Type: IV induction Ventilation: Mask ventilation without difficulty LMA: LMA inserted LMA Size: 4.0 Number of attempts: 1 Placement Confirmation: positive ETCO2 and breath sounds checked- equal and bilateral Tube secured with: Tape Dental Injury: Teeth and Oropharynx as per pre-operative assessment  Comments: Cervical collar remains on throughout; patient reports comfort prior to induction

## 2011-11-24 NOTE — Interval H&P Note (Signed)
History and Physical Interval Note:  11/24/2011 1:15 PM  [name]  has presented today for surgery, with the diagnosis of right shoulder joint infection  The various methods of treatment have been discussed with the patient and family. After consideration of risks, benefits and other options for treatment, the patient has consented to  Procedure(s) (LRB): INCISION AND DRAINAGE ABSCESS (Right) as a surgical intervention .  The patients' history has been reviewed, patient examined, no change in status, stable for surgery.  I have reviewed the patients' chart and labs.  Questions were answered to the patient's satisfaction.     Fuller Canada

## 2011-11-24 NOTE — Anesthesia Preprocedure Evaluation (Signed)
Anesthesia Evaluation  Patient identified by MRN, date of birth, ID band Patient awake    Reviewed: Allergy & Precautions, H&P , NPO status , Patient's Chart, lab work & pertinent test results  Airway Mallampati: II TM Distance: >3 FB Neck ROM: Limited  Mouth opening: Limited Mouth Opening Comment: Keep collar in place for intubation. Dental  (+) Edentulous Upper, Partial Lower, Poor Dentition, Missing, Chipped and Dental Advisory Given   Pulmonary COPDformer smoker breath sounds clear to auscultation        Cardiovascular hypertension, Pt. on medications + CAD, + Cardiac Stents and + CABG Rhythm:Regular Rate:Normal     Neuro/Psych Anxiety    GI/Hepatic GERD-  Medicated and Controlled,  Endo/Other    Renal/GU      Musculoskeletal   Abdominal   Peds  Hematology   Anesthesia Other Findings   Reproductive/Obstetrics                           Anesthesia Physical Anesthesia Plan  ASA: III  Anesthesia Plan: General   Post-op Pain Management:    Induction: Intravenous  Airway Management Planned: LMA  Additional Equipment:   Intra-op Plan:   Post-operative Plan: Extubation in OR  Informed Consent: I have reviewed the patients History and Physical, chart, labs and discussed the procedure including the risks, benefits and alternatives for the proposed anesthesia with the patient or authorized representative who has indicated his/her understanding and acceptance.     Plan Discussed with:   Anesthesia Plan Comments: (Plan for GOT w/ glidescope if LMA not successful. Pt to remain in cervical collar at all times to protect C7 Fx.)        Anesthesia Quick Evaluation

## 2011-11-24 NOTE — H&P (Signed)
Subjective:   [name] is a 71 y.o. male who presents with pain, swelling RIGHT shoulder. The patient has had multiple procedures on the RIGHT shoulder. He had an attempt at repair of a massive rotator cuff tear which was unsuccessful and complicated by infection which was treated with irrigation debridement and IV antibiotics after several attempts at aspiration and oral antibiotics.  His infection cleared and he was doing fairly well until a recent fall. He fell and fractured his cervical spine and will require surgery. However there is concerned because of the recent onset of pain and swelling in the RIGHT shoulder with redness loss of motion and pain. On April 6 after 24 hours of increasing pain he went to the emergency room and had an x-ray which was normal white count normal no fever but increasing pain. This is very similar to the last presentation for joint infection at which time he required open debridement  After 2 days of vancomycin his symptoms have improved redness has decreased pain has gotten better. He comes in for evaluation.  The following portions of the patient's history were reviewed and updated as appropriate: allergies, current medications, past family history, past medical history, past social history, past surgical history and problem list.  Review of Systems   Past Medical History  Diagnosis Date  . HTN (hypertension)   . Hyperlipidemia   . Coronary artery disease     IN 2000   STENT PLACED IN 2012  . Hx of cardiac cath     STENT PLACED IN 2012  . Small bowel problem     HAD ALOT OF SCAR TISSUE FROM PREVIOUS SURGERIES..NG WAS INSERTED ...Marland KitchenMarland KitchenNO SURGERY NEEDED...Marland KitchenMarland KitchenIN FOR 8 DAYS  . Disorder of blood     BEEN TREATED BY DERMATOLOGIST X 4 YRS..."BLOOD BLISTERS"  . Arthritis   . Gout   . Anxiety     Past Surgical History  Procedure Date  . Right shoulder dr Romeo Apple     open cuff repair   . Coronary stent placement 2010  . Sternal surg     HAS HAD 5-6 ON  HIS STERNUM  . Coronary angioplasty   . Cataracts     BILATERAL  . Tonsillectomy   . Cholecystectomy   . Lumbar laminectomy/decompression microdiscectomy 06/13/2011    Procedure: LUMBAR LAMINECTOMY/DECOMPRESSION MICRODISCECTOMY;  Surgeon: Karn Cassis;  Location: [facility];  Service: Neurosurgery;  Laterality: N/A;  Lumbar three, lumbar four-five Laminectomy  . I/d right shoulder      failed cuff repair   . Coronary artery bypass graft 2000    5 vessels    History   Social History  . Marital Status: Married    Spouse Name: N/A    Number of Children: N/A  . Years of Education: N/A   Occupational History  . Not on file.   Social History Main Topics  . Smoking status: Former Smoker -- 1.0 packs/day for 1 years    Types: Cigarettes    Quit date: 12/27/1998  . Smokeless tobacco: Not on file  . Alcohol Use: No  . Drug Use: No  . Sexually Active: Not on file   Other Topics Concern  . Not on file   Social History Narrative  . No narrative on file    A comprehensive review of systems was negative except for: complaints of blurred vision and watering of the eyes. Shortness of breath. Constipation. Numbness tingling and dizziness. Depression. Easy bleeding. Easy bruising.  Objective:  Physical Exam(12)  GENERAL: normal development , he is in a cervical collar hard collar  He can move his hand normally. Moves elbows normally.  CDV: pulses are normal  Skin: normal  Lymph: nodes were not palpable/normal  Psychiatric: awake, alert and oriented  Neuro: normal sensation  BP 126/60  Ht 6' (1.829 m)  Wt 210 lb (95.255 kg)  BMI 28.48 kg/m2  Right shoulder:  the shoulder is warm to touch and there is no swelling over the previous rotator cuff incision. An aspiration was attempted and 5 cc of bloody  fluid was obtained. He has painful range of motion in the shoulder. The skin is warm to touch there is no redness however as this has gotten better after the vancomycin according  to the patient.   Left shoulder:  No pain swelling or tenderness in the LEFT shoulder although there is a history of symptoms of cuff disease in the shoulder as well    Assessment:   Right infection shoulder  Plan:   I D right shoulder

## 2011-11-24 NOTE — Transfer of Care (Signed)
Immediate Anesthesia Transfer of Care Note  Patient: [name]  Procedure(s) Performed: Procedure(s) (LRB): IRRIGATION AND DEBRIDEMENT SHOULDER (Right)  Patient Location: PACU  Anesthesia Type: General  Level of Consciousness: awake, alert , oriented and patient cooperative  Airway & Oxygen Therapy: Patient Spontanous Breathing and Patient connected to face mask oxygen  Post-op Assessment: Report given to PACU RN and Post -op Vital signs reviewed and stable  Post vital signs: Reviewed and stable  Complications: No apparent anesthesia complications

## 2011-11-25 ENCOUNTER — Ambulatory Visit: Payer: Medicare Other | Admitting: Orthopedic Surgery

## 2011-11-25 ENCOUNTER — Encounter (HOSPITAL_COMMUNITY): Payer: Medicare Other

## 2011-11-26 ENCOUNTER — Encounter: Payer: Self-pay | Admitting: Orthopedic Surgery

## 2011-11-26 ENCOUNTER — Encounter (HOSPITAL_COMMUNITY): Payer: Medicare Other

## 2011-11-26 ENCOUNTER — Ambulatory Visit (INDEPENDENT_AMBULATORY_CARE_PROVIDER_SITE_OTHER): Payer: Medicare Other | Admitting: Orthopedic Surgery

## 2011-11-26 VITALS — Ht 72.0 in | Wt 200.0 lb

## 2011-11-26 DIAGNOSIS — M009 Pyogenic arthritis, unspecified: Secondary | ICD-10-CM

## 2011-11-26 NOTE — Patient Instructions (Signed)
KEEP DRESSING DRY

## 2011-11-26 NOTE — Progress Notes (Signed)
Patient ID: [name], male   DOB: [birth year], 71 y.o.   MRN: [id number] Chief Complaint  Patient presents with  . Routine Post Op    post op 1 right shoulder, DOS 11/24/11    Dressing change.  Wound is clean. Cultures had been negative.  PICC Line was removed.  Recommend followup on the 13th for staples out and then let Dr. Jeral Fruit note the patient is okay for cervical surgery

## 2011-11-27 LAB — WOUND CULTURE

## 2011-11-29 LAB — ANAEROBIC CULTURE

## 2011-12-08 ENCOUNTER — Encounter: Payer: Self-pay | Admitting: Orthopedic Surgery

## 2011-12-08 ENCOUNTER — Ambulatory Visit (INDEPENDENT_AMBULATORY_CARE_PROVIDER_SITE_OTHER): Payer: Medicare Other | Admitting: Orthopedic Surgery

## 2011-12-08 ENCOUNTER — Other Ambulatory Visit: Payer: Self-pay | Admitting: Neurosurgery

## 2011-12-08 VITALS — Ht 72.0 in | Wt 200.0 lb

## 2011-12-08 DIAGNOSIS — M75120 Complete rotator cuff tear or rupture of unspecified shoulder, not specified as traumatic: Secondary | ICD-10-CM

## 2011-12-08 DIAGNOSIS — M009 Pyogenic arthritis, unspecified: Secondary | ICD-10-CM

## 2011-12-08 NOTE — Patient Instructions (Signed)
He has been released for surgery

## 2011-12-08 NOTE — Progress Notes (Signed)
Patient ID: [name], male   DOB: [birth year], 71 y.o.   MRN: [id number] Chief Complaint  Patient presents with  . Follow-up    Recheck on right shoulder with staples out.     The staples are removed. There was some staple erythema, but there are no signs of infection. No drainage. No swelling.  His cultures came back negative.  Patient can proceed with surgical neck fusion as needed. Per Dr. Jeral Fruit

## 2011-12-09 ENCOUNTER — Encounter (HOSPITAL_COMMUNITY): Payer: Self-pay | Admitting: Pharmacy Technician

## 2011-12-12 ENCOUNTER — Encounter (HOSPITAL_COMMUNITY): Payer: Self-pay

## 2011-12-12 ENCOUNTER — Encounter (HOSPITAL_COMMUNITY)
Admission: RE | Admit: 2011-12-12 | Discharge: 2011-12-12 | Disposition: A | Discharge status: Home / Self Care | Payer: Medicare Other | Source: Ambulatory Visit | Attending: Neurosurgery | Admitting: Neurosurgery

## 2011-12-12 HISTORY — DX: Personal history of Methicillin resistant Staphylococcus aureus infection: Z86.14

## 2011-12-12 LAB — BASIC METABOLIC PANEL
BUN: 16 mg/dL (ref 6–23)
Calcium: 9.6 mg/dL (ref 8.4–10.5)
Creatinine, Ser: 1.25 mg/dL (ref 0.50–1.35)
GFR calc non Af Amer: 57 mL/min — ABNORMAL LOW (ref >90)
Glucose, Bld: 160 mg/dL — ABNORMAL HIGH (ref 70–99)

## 2011-12-12 LAB — TYPE AND SCREEN: ABO/RH(D): O POS

## 2011-12-12 LAB — CBC
MCH: 27.7 pg (ref 26.0–34.0)
MCHC: 32.9 g/dL (ref 30.0–36.0)
Platelets: 234 10*3/uL (ref 150–400)

## 2011-12-12 NOTE — Pre-Procedure Instructions (Signed)
20 Mickel Schreur [facility]  12/12/2011   Your procedure is scheduled on:  Dec 17, 2011 (Wed)  Report to [facility] at 8:00 AM.  Call this number if you have problems the morning of surgery: [phone]   Remember:   Do not eat food:After Midnight.  May have clear liquids: up to 4 Hours before arrival.  Clear liquids include soda, tea, black coffee, apple or grape juice, broth.  Take these medicines the morning of surgery with A SIP OF WATER: pain pill as needed,   STOP ASPIRIN   Do not wear jewelry, make-up or nail polish.  Do not wear lotions, powders, or perfumes. You may wear deodorant.  Do not shave 48 hours prior to surgery. Men may shave face and neck.  Do not bring valuables to the hospital.  Contacts, dentures or bridgework may not be worn into surgery.  Leave suitcase in the car. After surgery it may be brought to your room.  For patients admitted to the hospital, checkout time is 11:00 AM the day of discharge.   Patients discharged the day of surgery will not be allowed to drive home.  Name and phone number of your driver: NA  Special Instructions: CHG Shower Use Special Wash: 1/2 bottle night before surgery and 1/2 bottle morning of surgery.   Please read over the following fact sheets that you were given: Pain Booklet, Blood Transfusion Information and Surgical Site Infection Prevention

## 2011-12-12 NOTE — Progress Notes (Signed)
Spoke with Shanda Bumps in office to have dr botero sign orders in inbox

## 2011-12-17 ENCOUNTER — Encounter (HOSPITAL_COMMUNITY): Payer: Self-pay | Admitting: Critical Care Medicine

## 2011-12-17 ENCOUNTER — Encounter (HOSPITAL_COMMUNITY)
Admission: RE | Disposition: A | Discharge status: Home / Self Care | Payer: Self-pay | Source: Ambulatory Visit | Attending: Neurosurgery

## 2011-12-17 ENCOUNTER — Ambulatory Visit (HOSPITAL_COMMUNITY): Payer: Medicare Other

## 2011-12-17 ENCOUNTER — Encounter (HOSPITAL_COMMUNITY): Payer: Self-pay | Admitting: General Practice

## 2011-12-17 ENCOUNTER — Inpatient Hospital Stay (HOSPITAL_COMMUNITY)
Admission: RE | Admit: 2011-12-17 | Discharge: 2011-12-19 | DRG: 473 | Disposition: A | Discharge status: Home / Self Care | Payer: Medicare Other | Source: Ambulatory Visit | Attending: Neurosurgery | Admitting: Neurosurgery

## 2011-12-17 ENCOUNTER — Ambulatory Visit (HOSPITAL_COMMUNITY): Payer: Medicare Other | Admitting: Critical Care Medicine

## 2011-12-17 ENCOUNTER — Encounter (HOSPITAL_COMMUNITY): Payer: Self-pay | Admitting: *Deleted

## 2011-12-17 DIAGNOSIS — Z8249 Family history of ischemic heart disease and other diseases of the circulatory system: Secondary | ICD-10-CM

## 2011-12-17 DIAGNOSIS — M109 Gout, unspecified: Secondary | ICD-10-CM | POA: Diagnosis present

## 2011-12-17 DIAGNOSIS — Y92009 Unspecified place in unspecified non-institutional (private) residence as the place of occurrence of the external cause: Secondary | ICD-10-CM

## 2011-12-17 DIAGNOSIS — I2510 Atherosclerotic heart disease of native coronary artery without angina pectoris: Secondary | ICD-10-CM | POA: Diagnosis present

## 2011-12-17 DIAGNOSIS — Z8614 Personal history of Methicillin resistant Staphylococcus aureus infection: Secondary | ICD-10-CM

## 2011-12-17 DIAGNOSIS — F411 Generalized anxiety disorder: Secondary | ICD-10-CM | POA: Diagnosis present

## 2011-12-17 DIAGNOSIS — Z87891 Personal history of nicotine dependence: Secondary | ICD-10-CM

## 2011-12-17 DIAGNOSIS — IMO0002 Reserved for concepts with insufficient information to code with codable children: Secondary | ICD-10-CM

## 2011-12-17 DIAGNOSIS — S12600A Unspecified displaced fracture of seventh cervical vertebra, initial encounter for closed fracture: Principal | ICD-10-CM | POA: Diagnosis present

## 2011-12-17 DIAGNOSIS — M810 Age-related osteoporosis without current pathological fracture: Secondary | ICD-10-CM | POA: Diagnosis present

## 2011-12-17 DIAGNOSIS — Z7982 Long term (current) use of aspirin: Secondary | ICD-10-CM

## 2011-12-17 DIAGNOSIS — W19XXXA Unspecified fall, initial encounter: Secondary | ICD-10-CM | POA: Diagnosis present

## 2011-12-17 DIAGNOSIS — Z9861 Coronary angioplasty status: Secondary | ICD-10-CM

## 2011-12-17 DIAGNOSIS — E785 Hyperlipidemia, unspecified: Secondary | ICD-10-CM | POA: Diagnosis present

## 2011-12-17 DIAGNOSIS — M5020 Other cervical disc displacement, unspecified cervical region: Secondary | ICD-10-CM | POA: Diagnosis present

## 2011-12-17 DIAGNOSIS — Z951 Presence of aortocoronary bypass graft: Secondary | ICD-10-CM

## 2011-12-17 DIAGNOSIS — Z79899 Other long term (current) drug therapy: Secondary | ICD-10-CM

## 2011-12-17 DIAGNOSIS — I10 Essential (primary) hypertension: Secondary | ICD-10-CM | POA: Diagnosis present

## 2011-12-17 HISTORY — DX: Pneumonia, unspecified organism: J18.9

## 2011-12-17 HISTORY — PX: ANTERIOR CERVICAL CORPECTOMY: SHX1159

## 2011-12-17 SURGERY — ANTERIOR CERVICAL CORPECTOMY
Anesthesia: General | Site: Neck | Wound class: Clean

## 2011-12-17 MED ORDER — NEOSTIGMINE METHYLSULFATE 1 MG/ML IJ SOLN
INTRAMUSCULAR | Status: DC | PRN
  Administered 2011-12-17: 4 mg via INTRAVENOUS

## 2011-12-17 MED ORDER — MENTHOL 3 MG MT LOZG: 1.0000 | LOZENGE | OROMUCOSAL | Status: DC | PRN

## 2011-12-17 MED ORDER — THROMBIN 20000 UNITS EX KIT
PACK | CUTANEOUS | Status: DC | PRN
  Administered 2011-12-17: 20000 [IU] via TOPICAL

## 2011-12-17 MED ORDER — ROCURONIUM BROMIDE 100 MG/10ML IV SOLN
INTRAVENOUS | Status: DC | PRN
  Administered 2011-12-17: 40 mg via INTRAVENOUS
  Administered 2011-12-17: 10 mg via INTRAVENOUS

## 2011-12-17 MED ORDER — ONDANSETRON HCL 4 MG/2ML IJ SOLN
INTRAMUSCULAR | Status: DC | PRN
  Administered 2011-12-17 (×2): 4 mg via INTRAVENOUS

## 2011-12-17 MED ORDER — SODIUM CHLORIDE 0.9 % IJ SOLN: 3.0000 mL | INTRAMUSCULAR | Status: DC | PRN

## 2011-12-17 MED ORDER — SODIUM CHLORIDE 0.9 % IJ SOLN: 3.0000 mL | Freq: Two times a day (BID) | INTRAMUSCULAR | Status: DC

## 2011-12-17 MED ORDER — MORPHINE SULFATE 4 MG/ML IJ SOLN
4.0000 mg | INTRAMUSCULAR | Status: DC | PRN
  Administered 2011-12-17 – 2011-12-18 (×3): 4 mg via INTRAVENOUS
  Filled 2011-12-17 (×3): qty 1

## 2011-12-17 MED ORDER — EPHEDRINE SULFATE 50 MG/ML IJ SOLN
INTRAMUSCULAR | Status: DC | PRN
  Administered 2011-12-17 (×2): 5 mg via INTRAVENOUS
  Administered 2011-12-17 (×2): 10 mg via INTRAVENOUS
  Administered 2011-12-17 (×2): 5 mg via INTRAVENOUS

## 2011-12-17 MED ORDER — CEFAZOLIN SODIUM 1-5 GM-% IV SOLN: 1.0000 g | Freq: Three times a day (TID) | INTRAVENOUS | Status: DC

## 2011-12-17 MED ORDER — ARTIFICIAL TEARS OP OINT
TOPICAL_OINTMENT | OPHTHALMIC | Status: DC | PRN
  Administered 2011-12-17: 1 via OPHTHALMIC

## 2011-12-17 MED ORDER — ONDANSETRON HCL 4 MG/2ML IJ SOLN: 4.0000 mg | INTRAMUSCULAR | Status: DC | PRN

## 2011-12-17 MED ORDER — ZOLPIDEM TARTRATE 5 MG PO TABS
5.0000 mg | ORAL_TABLET | Freq: Every evening | ORAL | Status: DC | PRN
  Administered 2011-12-17: 5 mg via ORAL
  Filled 2011-12-17: qty 1

## 2011-12-17 MED ORDER — CEFAZOLIN SODIUM-DEXTROSE 2-3 GM-% IV SOLR
INTRAVENOUS | Status: AC
  Filled 2011-12-17: qty 50

## 2011-12-17 MED ORDER — DEXAMETHASONE 4 MG PO TABS
4.0000 mg | ORAL_TABLET | Freq: Four times a day (QID) | ORAL | Status: DC
  Administered 2011-12-17 – 2011-12-19 (×5): 4 mg via ORAL
  Filled 2011-12-17 (×11): qty 1

## 2011-12-17 MED ORDER — GELATIN ABSORBABLE MT POWD
OROMUCOSAL | Status: DC | PRN
  Administered 2011-12-17: 13:00:00 via TOPICAL

## 2011-12-17 MED ORDER — ACETAMINOPHEN 325 MG PO TABS: 650.0000 mg | ORAL_TABLET | ORAL | Status: DC | PRN

## 2011-12-17 MED ORDER — MEPERIDINE HCL 25 MG/ML IJ SOLN: 6.2500 mg | INTRAMUSCULAR | Status: DC | PRN

## 2011-12-17 MED ORDER — LIDOCAINE HCL (CARDIAC) 20 MG/ML IV SOLN
INTRAVENOUS | Status: DC | PRN
  Administered 2011-12-17: 40 mg via INTRAVENOUS

## 2011-12-17 MED ORDER — PHENYLEPHRINE HCL 10 MG/ML IJ SOLN
INTRAMUSCULAR | Status: DC | PRN
  Administered 2011-12-17: 40 ug via INTRAVENOUS

## 2011-12-17 MED ORDER — DIAZEPAM 5 MG PO TABS
5.0000 mg | ORAL_TABLET | Freq: Four times a day (QID) | ORAL | Status: DC | PRN
  Administered 2011-12-17 – 2011-12-19 (×4): 5 mg via ORAL
  Filled 2011-12-17 (×3): qty 1

## 2011-12-17 MED ORDER — LIDOCAINE HCL 4 % MT SOLN
OROMUCOSAL | Status: DC | PRN
  Administered 2011-12-17: 4 mL via TOPICAL

## 2011-12-17 MED ORDER — CITALOPRAM HYDROBROMIDE 40 MG PO TABS
40.0000 mg | ORAL_TABLET | Freq: Every day | ORAL | Status: DC
  Administered 2011-12-17 – 2011-12-18 (×2): 40 mg via ORAL
  Filled 2011-12-17 (×2): qty 1

## 2011-12-17 MED ORDER — 0.9 % SODIUM CHLORIDE (POUR BTL) OPTIME
TOPICAL | Status: DC | PRN
  Administered 2011-12-17: 1000 mL

## 2011-12-17 MED ORDER — VECURONIUM BROMIDE 10 MG IV SOLR
INTRAVENOUS | Status: DC | PRN
  Administered 2011-12-17: 1 mg via INTRAVENOUS
  Administered 2011-12-17: 2 mg via INTRAVENOUS

## 2011-12-17 MED ORDER — CEFAZOLIN SODIUM 1-5 GM-% IV SOLN
INTRAVENOUS | Status: DC | PRN
  Administered 2011-12-17: 2 g via INTRAVENOUS

## 2011-12-17 MED ORDER — PROPOFOL 10 MG/ML IV EMUL
INTRAVENOUS | Status: DC | PRN
  Administered 2011-12-17: 180 mg via INTRAVENOUS

## 2011-12-17 MED ORDER — ACETAMINOPHEN 650 MG RE SUPP: 650.0000 mg | RECTAL | Status: DC | PRN

## 2011-12-17 MED ORDER — CEFAZOLIN SODIUM 1-5 GM-% IV SOLN
1.0000 g | Freq: Three times a day (TID) | INTRAVENOUS | Status: AC
  Administered 2011-12-17 – 2011-12-18 (×2): 1 g via INTRAVENOUS
  Filled 2011-12-17 (×3): qty 50

## 2011-12-17 MED ORDER — NITROGLYCERIN 0.4 MG SL SUBL: 0.4000 mg | SUBLINGUAL_TABLET | SUBLINGUAL | Status: DC | PRN

## 2011-12-17 MED ORDER — HEMOSTATIC AGENTS (NO CHARGE) OPTIME
TOPICAL | Status: DC | PRN
  Administered 2011-12-17: 1 via TOPICAL

## 2011-12-17 MED ORDER — MIDAZOLAM HCL 5 MG/5ML IJ SOLN
INTRAMUSCULAR | Status: DC | PRN
  Administered 2011-12-17: 1 mg via INTRAVENOUS

## 2011-12-17 MED ORDER — ATORVASTATIN CALCIUM 40 MG PO TABS
40.0000 mg | ORAL_TABLET | Freq: Every day | ORAL | Status: DC
  Administered 2011-12-17 – 2011-12-18 (×2): 40 mg via ORAL
  Filled 2011-12-17 (×3): qty 1

## 2011-12-17 MED ORDER — OXYCODONE-ACETAMINOPHEN 5-325 MG PO TABS
1.0000 | ORAL_TABLET | ORAL | Status: DC | PRN
  Administered 2011-12-17 – 2011-12-19 (×8): 2 via ORAL
  Filled 2011-12-17 (×8): qty 2

## 2011-12-17 MED ORDER — DEXAMETHASONE SODIUM PHOSPHATE 4 MG/ML IJ SOLN
INTRAMUSCULAR | Status: DC | PRN
  Administered 2011-12-17: 4 mg via INTRAVENOUS

## 2011-12-17 MED ORDER — PHENOL 1.4 % MT LIQD: 1.0000 | OROMUCOSAL | Status: DC | PRN

## 2011-12-17 MED ORDER — FENTANYL CITRATE 0.05 MG/ML IJ SOLN
INTRAMUSCULAR | Status: DC | PRN
  Administered 2011-12-17: 100 ug via INTRAVENOUS
  Administered 2011-12-17: 50 ug via INTRAVENOUS
  Administered 2011-12-17: 100 ug via INTRAVENOUS
  Administered 2011-12-17: 50 ug via INTRAVENOUS

## 2011-12-17 MED ORDER — GLYCOPYRROLATE 0.2 MG/ML IJ SOLN
INTRAMUSCULAR | Status: DC | PRN
  Administered 2011-12-17: 0.6 mg via INTRAVENOUS

## 2011-12-17 MED ORDER — HYDROMORPHONE HCL PF 1 MG/ML IJ SOLN
0.2500 mg | INTRAMUSCULAR | Status: DC | PRN
  Administered 2011-12-17 (×2): 0.5 mg via INTRAVENOUS

## 2011-12-17 MED ORDER — MORPHINE SULFATE 2 MG/ML IJ SOLN
INTRAMUSCULAR | Status: AC
  Administered 2011-12-17: 2 mg via INTRAMUSCULAR
  Filled 2011-12-17: qty 1

## 2011-12-17 MED ORDER — SODIUM CHLORIDE 0.9 % IV SOLN: 250.0000 mL | INTRAVENOUS | Status: DC

## 2011-12-17 MED ORDER — DEXAMETHASONE SODIUM PHOSPHATE 4 MG/ML IJ SOLN
4.0000 mg | Freq: Four times a day (QID) | INTRAMUSCULAR | Status: DC
  Administered 2011-12-17 – 2011-12-18 (×3): 4 mg via INTRAVENOUS
  Filled 2011-12-17 (×11): qty 1

## 2011-12-17 MED ORDER — DIAZEPAM 5 MG PO TABS
ORAL_TABLET | ORAL | Status: AC
  Filled 2011-12-17: qty 1

## 2011-12-17 MED ORDER — DOXAZOSIN MESYLATE 8 MG PO TABS
8.0000 mg | ORAL_TABLET | Freq: Every day | ORAL | Status: DC
  Administered 2011-12-17 – 2011-12-18 (×2): 8 mg via ORAL
  Filled 2011-12-17 (×3): qty 1

## 2011-12-17 MED ORDER — HETASTARCH-ELECTROLYTES 6 % IV SOLN
INTRAVENOUS | Status: DC | PRN
  Administered 2011-12-17: 14:00:00 via INTRAVENOUS

## 2011-12-17 MED ORDER — LACTATED RINGERS IV SOLN
INTRAVENOUS | Status: DC | PRN
  Administered 2011-12-17 (×3): via INTRAVENOUS

## 2011-12-17 MED ORDER — SODIUM CHLORIDE 0.9 % IV SOLN
INTRAVENOUS | Status: DC
  Administered 2011-12-17 – 2011-12-18 (×3): via INTRAVENOUS

## 2011-12-17 MED ORDER — SODIUM CHLORIDE 0.9 % IJ SOLN
3.0000 mL | Freq: Two times a day (BID) | INTRAMUSCULAR | Status: DC
  Administered 2011-12-18 – 2011-12-19 (×2): 3 mL via INTRAVENOUS

## 2011-12-17 MED ORDER — HYDROMORPHONE HCL PF 1 MG/ML IJ SOLN
INTRAMUSCULAR | Status: AC
  Filled 2011-12-17: qty 1

## 2011-12-17 SURGICAL SUPPLY — 58 items
BANDAGE GAUZE ELAST BULKY 4 IN (GAUZE/BANDAGES/DRESSINGS) ×4 IMPLANT
BENZOIN TINCTURE PRP APPL 2/3 (GAUZE/BANDAGES/DRESSINGS) ×2 IMPLANT
BIT DRILL SM SPINE QC 14 (BIT) ×2 IMPLANT
BLADE ULTRA TIP 2M (BLADE) ×2 IMPLANT
BUR BARREL STRAIGHT FLUTE 4.0 (BURR) ×2 IMPLANT
BUR MATCHSTICK NEURO 3.0 LAGG (BURR) ×2 IMPLANT
CANISTER SUCTION 2500CC (MISCELLANEOUS) ×2 IMPLANT
CLOTH BEACON ORANGE TIMEOUT ST (SAFETY) ×2 IMPLANT
CONT SPEC 4OZ CLIKSEAL STRL BL (MISCELLANEOUS) ×2 IMPLANT
COVER MAYO STAND STRL (DRAPES) ×2 IMPLANT
DRAIN JACKSON PRATT 10MM FLAT (MISCELLANEOUS) ×2 IMPLANT
DRAPE LAPAROTOMY 100X72 PEDS (DRAPES) ×2 IMPLANT
DRAPE MICROSCOPE LEICA (MISCELLANEOUS) ×2 IMPLANT
DRAPE POUCH INSTRU U-SHP 10X18 (DRAPES) ×2 IMPLANT
DRAPE PROXIMA HALF (DRAPES) ×4 IMPLANT
DURAPREP 6ML APPLICATOR 50/CS (WOUND CARE) ×2 IMPLANT
ELECT REM PT RETURN 9FT ADLT (ELECTROSURGICAL) ×2
ELECTRODE REM PT RTRN 9FT ADLT (ELECTROSURGICAL) ×1 IMPLANT
EVACUATOR SILICONE 100CC (DRAIN) ×2 IMPLANT
GAUZE SPONGE 4X4 16PLY XRAY LF (GAUZE/BANDAGES/DRESSINGS) IMPLANT
GLOVE BIO SURGEON STRL SZ8.5 (GLOVE) ×4 IMPLANT
GLOVE BIOGEL M 8.0 STRL (GLOVE) ×2 IMPLANT
GLOVE BIOGEL PI IND STRL 7.0 (GLOVE) ×2 IMPLANT
GLOVE BIOGEL PI INDICATOR 7.0 (GLOVE) ×2
GLOVE EXAM NITRILE LRG STRL (GLOVE) IMPLANT
GLOVE EXAM NITRILE MD LF STRL (GLOVE) IMPLANT
GLOVE EXAM NITRILE XL STR (GLOVE) IMPLANT
GLOVE EXAM NITRILE XS STR PU (GLOVE) IMPLANT
GLOVE SS BIOGEL STRL SZ 8 (GLOVE) ×2 IMPLANT
GLOVE SUPERSENSE BIOGEL SZ 8 (GLOVE) ×2
GLOVE SURG SS PI 6.5 STRL IVOR (GLOVE) ×6 IMPLANT
GOWN BRE IMP SLV AUR LG STRL (GOWN DISPOSABLE) IMPLANT
GOWN BRE IMP SLV AUR XL STRL (GOWN DISPOSABLE) IMPLANT
GOWN STRL REIN 2XL LVL4 (GOWN DISPOSABLE) IMPLANT
HEMOSTAT POWDER KIT SURGIFOAM (HEMOSTASIS) ×2 IMPLANT
KIT BASIN OR (CUSTOM PROCEDURE TRAY) ×2 IMPLANT
KIT ROOM TURNOVER OR (KITS) ×2 IMPLANT
NEEDLE SPNL 22GX3.5 QUINCKE BK (NEEDLE) ×4 IMPLANT
NS IRRIG 1000ML POUR BTL (IV SOLUTION) ×2 IMPLANT
PACK LAMINECTOMY NEURO (CUSTOM PROCEDURE TRAY) ×2 IMPLANT
PATTIES SURGICAL .5 X1 (DISPOSABLE) ×2 IMPLANT
PLATE ANT CERV XTEND 2 LV 42 (Plate) ×2 IMPLANT
PLATE ANT CERV XTEND 4 LV 78 (Plate) ×2 IMPLANT
RUBBERBAND STERILE (MISCELLANEOUS) ×4 IMPLANT
SCREW XTD VAR 4.2 SELF TAP (Screw) ×8 IMPLANT
SCREW XTEND SELFTAP VAR 4.6X14 (Screw) ×8 IMPLANT
SPONGE GAUZE 4X4 12PLY (GAUZE/BANDAGES/DRESSINGS) ×2 IMPLANT
SPONGE INTESTINAL PEANUT (DISPOSABLE) ×4 IMPLANT
SPONGE SURGIFOAM ABS GEL 100 (HEMOSTASIS) ×2 IMPLANT
STRIP CLOSURE SKIN 1/2X4 (GAUZE/BANDAGES/DRESSINGS) ×2 IMPLANT
STRIP ILIUM TRICORT 2.2CMX60MM (Neuro Prosthesis/Implant) ×2 IMPLANT
SUT VIC AB 3-0 SH 8-18 (SUTURE) ×4 IMPLANT
SYR 20ML ECCENTRIC (SYRINGE) ×2 IMPLANT
TAPE CLOTH SURG 4X10 WHT LF (GAUZE/BANDAGES/DRESSINGS) ×2 IMPLANT
TOWEL OR 17X24 6PK STRL BLUE (TOWEL DISPOSABLE) ×2 IMPLANT
TOWEL OR 17X26 10 PK STRL BLUE (TOWEL DISPOSABLE) ×2 IMPLANT
WATER STERILE IRR 1000ML POUR (IV SOLUTION) ×2 IMPLANT
Xpand Cage 68-72 mm ×2 IMPLANT

## 2011-12-17 NOTE — Progress Notes (Signed)
Awake,c/o of muscle spasms. No pain in lue as preop. strenght nl. Drain working well

## 2011-12-17 NOTE — Anesthesia Preprocedure Evaluation (Addendum)
Anesthesia Evaluation  Patient identified by MRN, date of birth, ID band Patient awake    Reviewed: Allergy & Precautions, H&P , NPO status , Patient's Chart, lab work & pertinent test results  Airway Mallampati: I TM Distance: >3 FB Neck ROM: Limited    Dental  (+) Edentulous Upper and Missing,    Pulmonary neg pulmonary ROS,  breath sounds clear to auscultation  Pulmonary exam normal       Cardiovascular hypertension, Pt. on medications + CAD and + CABG Rhythm:Regular Rate:Normal     Neuro/Psych PSYCHIATRIC DISORDERS Anxiety    GI/Hepatic Neg liver ROS, GERD-  Medicated and Controlled,  Endo/Other  negative endocrine ROS  Renal/GU negative Renal ROS  negative genitourinary   Musculoskeletal negative musculoskeletal ROS (+)   Abdominal   Peds  Hematology negative hematology ROS (+)   Anesthesia Other Findings   Reproductive/Obstetrics                          Anesthesia Physical Anesthesia Plan  ASA: III  Anesthesia Plan: General   Post-op Pain Management:    Induction: Intravenous  Airway Management Planned: Oral ETT and Video Laryngoscope Planned  Additional Equipment:   Intra-op Plan:   Post-operative Plan: Extubation in OR  Informed Consent: I have reviewed the patients History and Physical, chart, labs and discussed the procedure including the risks, benefits and alternatives for the proposed anesthesia with the patient or authorized representative who has indicated his/her understanding and acceptance.   Dental advisory given  Plan Discussed with: CRNA, Anesthesiologist and Surgeon  Anesthesia Plan Comments:         Anesthesia Quick Evaluation

## 2011-12-17 NOTE — Anesthesia Procedure Notes (Signed)
Procedure Name: Intubation Date/Time: 12/17/2011 11:29 AM Performed by: Elon Alas Pre-anesthesia Checklist: Patient identified, Timeout performed, Emergency Drugs available, Suction available and Patient being monitored Patient Re-evaluated:Patient Re-evaluated prior to inductionOxygen Delivery Method: Circle system utilized Preoxygenation: Pre-oxygenation with 100% oxygen Intubation Type: IV induction Ventilation: Mask ventilation without difficulty and Oral airway inserted - appropriate to patient size Grade View: Grade I Tube size: 7.5 mm Number of attempts: 1 Airway Equipment and Method: Video-laryngoscopy,  Stylet and LTA kit utilized Placement Confirmation: positive ETCO2,  ETT inserted through vocal cords under direct vision and breath sounds checked- equal and bilateral Secured at: 22 cm Tube secured with: Tape Dental Injury: Teeth and Oropharynx as per pre-operative assessment  Comments: Neutral c-spine  With c-collar on

## 2011-12-17 NOTE — Progress Notes (Signed)
Utilization review completed.  

## 2011-12-17 NOTE — H&P (Signed)
[name] is an 71 y.o. male.   Chief Complaint:fall HPI: patient who fell back in march of this year. No history of loc.a onset of neck painand [facility] .had dignosis of c7 fracture. He saw me and because of infectin in the right shoulder we started with conservative treatment. Now he is getting weakness of right triceps associated with more pain. Ortho treated him for infection of right shoulder and he was cleared for surgery  Past Medical History  Diagnosis Date  . HTN (hypertension)   . Hyperlipidemia   . Coronary artery disease     IN 2000   STENT PLACED IN 2012  . Hx of cardiac cath     STENT PLACED IN 2012  . Small bowel problem     HAD ALOT OF SCAR TISSUE FROM PREVIOUS SURGERIES..NG WAS INSERTED ...Marland KitchenMarland KitchenNO SURGERY NEEDED...Marland KitchenMarland KitchenIN FOR 8 DAYS  . Disorder of blood     BEEN TREATED BY DERMATOLOGIST X 4 YRS..."BLOOD BLISTERS"  . Arthritis   . Gout   . Anxiety   . PICC (peripherally inserted central catheter) removal   . Hx MRSA infection     rt shoulder    Past Surgical History  Procedure Date  . Right shoulder dr Romeo Apple     open cuff repair   . Coronary stent placement 2010  . Sternal surg     HAS HAD 5-6 ON HIS STERNUM  . Coronary angioplasty   . Cataracts     BILATERAL  . Tonsillectomy   . Cholecystectomy   . Lumbar laminectomy/decompression microdiscectomy 06/13/2011    Procedure: LUMBAR LAMINECTOMY/DECOMPRESSION MICRODISCECTOMY;  Surgeon: Karn Cassis;  Location: [facility];  Service: Neurosurgery;  Laterality: N/A;  Lumbar three, lumbar four-five Laminectomy  . I/d right shoulder      failed cuff repair   . Coronary artery bypass graft 2000    5 vessels  . Eye surgery     bilateral cataract    Family History  Problem Relation Age of Onset  . Heart disease    . Hypertension Mother   . Hypertension Maternal Aunt   . Hypertension Maternal Uncle   . Anesthesia problems Neg Hx   . Hypotension Neg Hx   . Malignant hyperthermia Neg  Hx   . Pseudochol deficiency Neg Hx    Social History:  reports that he quit smoking about 12 years ago. His smoking use included Cigarettes. He has a 1 pack-year smoking history. He does not have any smokeless tobacco history on file. He reports that he does not drink alcohol or use illicit drugs.  Allergies:  Allergies  Allergen Reactions  . Metoprolol Other (See Comments)    REACTION: bradycardia  . Morphine Other (See Comments)    REACTION: made him go crazy    Medications Prior to Admission  Medication Sig Dispense Refill  . aspirin EC 81 MG tablet Take 81 mg by mouth daily.      . citalopram (CELEXA) 40 MG tablet Take 40 mg by mouth daily.      Marland Kitchen doxazosin (CARDURA) 8 MG tablet Take 8 mg by mouth at bedtime.       Marland Kitchen HYDROcodone-acetaminophen (NORCO) 10-325 MG per tablet Take 2 tablets by mouth every 4 (four) hours as needed. For pain      . predniSONE (DELTASONE) 20 MG tablet Take 20 mg by mouth daily.      . simvastatin (ZOCOR) 80 MG tablet Take 40 mg by mouth at  bedtime.      . nitroGLYCERIN (NITROSTAT) 0.4 MG SL tablet Place 0.4 mg under the tongue every 5 (five) minutes as needed. For chest pain        No results found for this or any previous visit (from the past 48 hour(s)). No results found.  Review of Systems  Constitutional: Negative.   HENT: Positive for neck pain.   Eyes: Negative.   Respiratory: Negative.   Cardiovascular: Negative.   Gastrointestinal: Negative.   Musculoskeletal: Positive for back pain.       Infection right shoulder taken care by ortho. Cleared up for surgery  Skin: Negative.   Neurological: Positive for focal weakness.  Endo/Heme/Allergies: Negative.   Psychiatric/Behavioral: Negative.     Blood pressure 171/88, pulse 67, temperature 98.1 F (36.7 C), temperature source Oral, resp. rate 17, SpO2 96.00%. Physical Exam hent. Nl. Neck in a yale brace. Cv.nl. Lungs ,nl. Abdomen,nl. Extremities, tenderness in right shoulder with no  evidence of infection.NEURO weakness of left triceps.  Assessment/Plancorpectomy of c7 with fusion. Patient aware of risks and benefits of surgery  Elaijah Munoz M 12/17/2011, 8:55 AM

## 2011-12-17 NOTE — Progress Notes (Signed)
Stable, decrease of pain aware of the surgical procedure. Op note number [phone]

## 2011-12-17 NOTE — Progress Notes (Signed)
Pt.  Initial didn't have much pain, as afternoon progressed having complaints of neck and back discomfort, spasms in rt shoulder.  Medicated for pain with IV morphine, also had 2 percocet, and given valium.  Waiting for dinner tray at this time. IV infusing.

## 2011-12-17 NOTE — Anesthesia Postprocedure Evaluation (Signed)
  Anesthesia Post-op Note  Patient: [name]  Procedure(s) Performed: Procedure(s) (LRB): ANTERIOR CERVICAL CORPECTOMY (N/A)  Patient Location: PACU  Anesthesia Type: General  Level of Consciousness: awake, alert  and oriented  Airway and Oxygen Therapy: Patient Spontanous Breathing  Post-op Pain: mild  Post-op Assessment: Post-op Vital signs reviewed, Patient's Cardiovascular Status Stable, Respiratory Function Stable, Patent Airway, No signs of Nausea or vomiting and Pain level controlled  Post-op Vital Signs: Reviewed and stable  Complications: No apparent anesthesia complications

## 2011-12-17 NOTE — Preoperative (Signed)
Beta Blockers   Reason not to administer Beta Blockers:Not Applicable 

## 2011-12-17 NOTE — Transfer of Care (Signed)
Immediate Anesthesia Transfer of Care Note  Patient: [name]  Procedure(s) Performed: Procedure(s) (LRB): ANTERIOR CERVICAL CORPECTOMY (N/A)  Patient Location: PACU  Anesthesia Type: General  Level of Consciousness: awake, alert  and oriented  Airway & Oxygen Therapy: Patient Spontanous Breathing and Patient connected to nasal cannula oxygen  Post-op Assessment: Report given to PACU RN, Post -op Vital signs reviewed and stable and Patient moving all extremities X 4  Post vital signs: Reviewed and stable  Complications: No apparent anesthesia complications

## 2011-12-18 ENCOUNTER — Encounter (HOSPITAL_COMMUNITY): Payer: Self-pay | Admitting: Neurosurgery

## 2011-12-18 ENCOUNTER — Inpatient Hospital Stay (HOSPITAL_COMMUNITY): Payer: Medicare Other

## 2011-12-18 MED ORDER — CITALOPRAM HYDROBROMIDE 20 MG PO TABS
20.0000 mg | ORAL_TABLET | Freq: Every day | ORAL | Status: DC
  Administered 2011-12-19: 20 mg via ORAL
  Filled 2011-12-18: qty 1

## 2011-12-18 MED ORDER — MORPHINE SULFATE 2 MG/ML IJ SOLN
2.0000 mg | INTRAMUSCULAR | Status: DC | PRN
  Administered 2011-12-18: 2 mg via INTRAVENOUS
  Filled 2011-12-18: qty 1

## 2011-12-18 NOTE — Clinical Social Work Note (Signed)
Clinical Social Worker received consult for SNF. CSW reviewed chart and discussed with RN. PT is not recommending any follow up. RNCM is aware. CSW is signing off as no further needs identified. Please reconsult if a need arise prior to discharge.   Dede Query, MSW, Theresia Majors [phone]

## 2011-12-18 NOTE — Evaluation (Signed)
Physical Therapy Evaluation Patient Details Name: [name] MRN: [id number] DOB: [birth year] Today's Date: 12/18/2011 Time: 7829-5621 PT Time Calculation (min): 25 min  PT Assessment / Plan / Recommendation Clinical Impression  Pt is 71 y/o male admitted for s/p ACDF C7 due to fall in March 2013 sustaining a C7 fx.  Pt moving well and has no further PT needs.  PT will sign off.    PT Assessment  Patent does not need any further PT services    Follow Up Recommendations  No PT follow up    Barriers to Discharge        lEquipment Recommendations  None recommended by PT    Recommendations for Other Services     Frequency      Precautions / Restrictions Precautions Precautions: Cervical Precaution Comments: Reviewed cervical precautioins Required Braces or Orthoses: Cervical Brace Cervical Brace: Hard collar;Applied in supine position   Pertinent Vitals/Pain No c/o neck pain      Mobility  Transfers Transfers: Sit to Stand;Stand to Sit Sit to Stand: 7: Independent;From bed Stand to Sit: 7: Independent;To bed Ambulation/Gait Ambulation/Gait Assistance: 7: Independent Ambulation Distance (Feet): 200 Feet Assistive device: None Gait Pattern: Within Functional Limits Stairs: Yes Stairs Assistance: 6: Modified independent (Device/Increase time) Stair Management Technique: One rail Right;Forwards Number of Stairs: 5     Exercises     PT Diagnosis:    PT Problem List:   PT Treatment Interventions:     PT Goals    Visit Information  Last PT Received On: 12/18/11 Assistance Needed: +1    Subjective Data  Subjective: "I guess you don't know I don't have a sternum." (Pt had open heart surgery in 2006 and got MRSA infection and took out pt's sternum.) Patient Stated Goal: To go home   Prior Functioning  Home Living Lives With: Spouse Available Help at Discharge: Family Type of Home: House Home Access: Stairs to enter Secretary/administrator of Steps:  2 Entrance Stairs-Rails: None Home Layout: One level Bathroom Shower/Tub: Walk-in shower;Door [employer] Toilet: Standard Bathroom Accessibility: Yes How Accessible: Accessible via walker Home Adaptive Equipment: Dan Humphreys - four wheeled;Bedside commode/3-in-1;Straight cane Prior Function Level of Independence: Independent Able to Take Stairs?: Yes Driving: Yes Vocation: Retired Musician: No difficulties Dominant Hand: Left    Cognition  Overall Cognitive Status: Appears within functional limits for tasks assessed/performed Arousal/Alertness: Awake/alert Orientation Level: Appears intact for tasks assessed Behavior During Session: [facility] for tasks performed    Extremity/Trunk Assessment Right Upper Extremity Assessment RUE ROM/Strength/Tone: Unable to fully assess (Pt with Rotator cuff tear causing overall ROM impairment) Right Lower Extremity Assessment RLE ROM/Strength/Tone: Within functional levels RLE Sensation: WFL - Light Touch RLE Coordination: WFL - gross/fine motor Left Lower Extremity Assessment LLE ROM/Strength/Tone: Within functional levels LLE Sensation: WFL - Light Touch LLE Coordination: WFL - gross/fine motor Trunk Assessment Trunk Assessment: Normal   Balance Balance Balance Assessed: Yes Static Sitting Balance Static Sitting - Balance Support: Feet supported Static Sitting - Level of Assistance: 7: Independent  End of Session PT - End of Session Equipment Utilized During Treatment: Gait belt;Cervical collar Activity Tolerance: Patient tolerated treatment well Patient left: in chair;with call bell/phone within reach Nurse Communication: Mobility status   Raena Pau 12/18/2011, 8:20 AM Jake Shark, PT DPT [phone]

## 2011-12-18 NOTE — Progress Notes (Signed)
Chart reviewed and spoke with pt.  He is ambulating in room independently.  He reports he feels he can perform BADLs modified independently and was able to verbalize safe technique.  He verbalized understanding of brace wear and how to change pads. He does not feel he needs OT, and no OT needs identified.  Will sign off.  Jeani Hawking, OTR/L [phone]

## 2011-12-18 NOTE — Op Note (Signed)
[name], [name]             ACCOUNT NO.:  [id number]  MEDICAL RECORD NO.:  [id number]  LOCATION:  3015                         FACILITY:  [facility]  PHYSICIAN:  Hilda Lias, M.D.   DATE OF BIRTH:  1940-12-01  DATE OF PROCEDURE:  12/17/2011 DATE OF DISCHARGE:                              OPERATIVE REPORT   PREOPERATIVE DIAGNOSES:  Fracture of C7 with stenosis.  Weakness of the triceps.  POSTOPERATIVE DIAGNOSES:  Fracture of C7 with stenosis.  Weakness of the triceps.  PROCEDURE:  Decompression of C6, C7, T1 with a spacer and a plate from J4-N8.  Microscope.  SURGEON:  Hilda Lias, MD  ASSISTANT:  Cristi Loron, MD  CLINICAL HISTORY:  [name] is a 71 year old gentleman who around October 21, 2011, while he was at home trying to get off the couch, he went down and hit a clock sustaining a laceration of the scalp.  The patient immediately complained of neck pain and he was seen at [facility] Room with a CT and MRI of the spine showed that he has acute fracture of C7.  I saw him in my office.  He was seeing wearing an Aspen brace.  In my office, we look at the x-ray, we talked about conservative treatment versus surgery.  Nevertheless, the patient later on complaining of more pain.  The pain was in the right shoulder.  He was seen by Dr. Romeo Apple , orthopedic surgeon , because of previous history of infection at the right shoulder, he was given a course of several days of antibiotics. The patient has a history of 5 years of taking prednisone by mouth because of skin infection with the previous history of infection of the shoulder plus questionable infection of the sternum after open heart surgery.  After the infection was cleared up, he continued to have right arm pain, we found that he has a weakness of the triceps and because of that, I agreed with the surgical procedure since he was cleared from the shoulder infection.  PROCEDURE:  The  patient was taken to the OR, and after intubation, the left side of the neck was cleaned with DuraPrep.  Transverse incision was made through the skin, subcutaneous tissue, platysma straight down to the cervical spine.  An x-ray initially showed that one needle was at the level of 5-6 and the second needle was at the level of 6-7 disk space.  Then, I opined that the disk space at the level of C6-C7 and with the help of the microscope, we went straight posteriorly.  What I found during the decompression was that the disk was going underneath at C6.  I tried to remove it with the hook and in view that the disk was Going  Upward, I went ahead and proceed with corpectomy.  The corpectomy was done with decompression of the nerve on right and left side.  Then, I tailored an allograft to fill up the gap with a plate going from the upper part to the bottom part.  I did repeat x-ray prior to secure the plate.  The xray showed that the corpectomy was done at the level of C6 instead of 7 what  the consent was for.  Because of that, I went ahead and removed the plate and removed the allograft and proceed with decompression and corpectomy of C7.  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  Because of that, I went ahead and removed the rest of the thoracic 1 until we found normal endplate at the level of thoracic 2.  Then, I went laterally, decompressed the foramen in the right side and the left side with plenty of space for the spinal cord.  Then, a spacer from C6 down thoracic 2 was inserted and this was followed by a plate from C5 all the way down to the thoracic 2. We did a lateral cervical spine and it show done the upper part of the plate and the spacer was in good position.  We used bone from the corpectomy to fill up the gap  laterally to allow fusion of the area. Although we achieved a good hemostasis, a drain was left in the precervical area, and the wound was closed with Vicryl and Steri-Strip. After surgery, the patient was awake, the weakness and the pain that he was having going to the right upper extremity was gone and there was not evidence of any compromise of the vocal cords.  Once the patient went to the floor, I talked to him  about the procedure,that not  only I did the C7 corpectomy but also the c6 and t1            ______________________________ Hilda Lias, M.D.     EB/MEDQ  D:  12/17/2011  T:  12/18/2011  Job:  [id number]

## 2011-12-18 NOTE — Progress Notes (Signed)
Patient ID: [name], male   DOB: Oct 13, 1940, 71 y.o.   MRN: [id number] Doing well. Voice a little hoarse. No pain, no weakness. Drain working well. Plan c spine xray

## 2011-12-18 NOTE — Progress Notes (Signed)
Patient ID: [name], male   DOB: 14-Jul-1941, 71 y.o.   MRN: [id number] Better,ambulating. Decrease of drainage. c spine xrays showed only the upper cage at c5. See orders

## 2011-12-19 ENCOUNTER — Inpatient Hospital Stay (HOSPITAL_COMMUNITY): Payer: Medicare Other

## 2011-12-19 ENCOUNTER — Encounter (HOSPITAL_COMMUNITY): Payer: Self-pay | Admitting: Radiology

## 2011-12-19 NOTE — Care Management Note (Signed)
    Page 1 of 1   12/19/2011     2:16:55 PM   CARE MANAGEMENT NOTE 12/19/2011  Patient:  [name], [name]   Account Number:  [id number]  Date Initiated:  12/19/2011  Documentation initiated by:  Onnie Boer  Subjective/Objective Assessment:   PT WAS ADMITTED FOR SURGERY     Action/Plan:   PRORESSION OF CARE AND DISCHARGE PLANNING   Anticipated DC Date:  12/19/2011   Anticipated DC Plan:  HOME/SELF CARE      DC Planning Services  CM consult      Choice offered to / List presented to:             Status of service:  Completed, signed off Medicare Important Message given?   (If response is "NO", the following Medicare IM given date fields will be blank) Date Medicare IM given:   Date Additional Medicare IM given:    Discharge Disposition:    Per UR Regulation:  Reviewed for med. necessity/level of care/duration of stay  If discussed at Long Length of Stay Meetings, dates discussed:    Comments:  12/19/11 Onnie Boer, RN, BSN 1416 PT WAS DC'D TO HOME WITH SELF CARE

## 2011-12-19 NOTE — Discharge Summary (Signed)
Physician Discharge Summary  Patient ID: [name] MRN: [id number] DOB/AGE: 03-19-1941 71 y.o.  Admit date: 12/17/2011 Discharge date: 12/19/2011  Admission Diagnoses:fracture c7  Discharge Diagnoses:fracture c7 hnp c67    Discharged Condition: no pain. 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 rate 20, height 6' (1.829 m), weight 93.441 kg (206 lb), SpO2 99.00%. No pain. Still draining from precervical space. No swelling. Voice back to normal. 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 He had previous drain and knows how to empty it. i will call  Tomorrow to check on the amount and  Probably remove it Sunday or Monday. He will check  with dr Ollen Bowl about the chronic intake of prednosone   Medication List  As of 12/19/2011 12:36 PM   ASK your doctor about these medications         aspirin EC 81 MG tablet   Take 81 mg by mouth daily.      citalopram 40 MG tablet   Commonly known as: CELEXA   Take 40 mg by mouth daily.      doxazosin 8 MG tablet   Commonly known as: CARDURA   Take 8 mg by mouth at bedtime.      HYDROcodone-acetaminophen 10-325 MG per tablet   Commonly known as: NORCO   Take 2 tablets by mouth every 4 (four) hours as needed. For pain      nitroGLYCERIN 0.4 MG SL tablet   Commonly known as: NITROSTAT   Place 0.4 mg under the tongue every 5 (five) minutes as needed. For chest pain      predniSONE 20 MG tablet   Commonly known as: DELTASONE   Take 20 mg by mouth daily.      simvastatin 80 MG tablet   Commonly known as: ZOCOR   Take 40 mg by mouth at bedtime.             Signed: Karn Cassis 12/19/2011, 12:36 PM

## 2011-12-31 ENCOUNTER — Ambulatory Visit (INDEPENDENT_AMBULATORY_CARE_PROVIDER_SITE_OTHER): Payer: Medicare Other

## 2011-12-31 ENCOUNTER — Encounter: Payer: Self-pay | Admitting: Orthopedic Surgery

## 2011-12-31 ENCOUNTER — Ambulatory Visit (INDEPENDENT_AMBULATORY_CARE_PROVIDER_SITE_OTHER): Payer: Medicare Other | Admitting: Orthopedic Surgery

## 2011-12-31 VITALS — BP 132/60 | Ht 72.0 in | Wt 206.0 lb

## 2011-12-31 DIAGNOSIS — M25519 Pain in unspecified shoulder: Secondary | ICD-10-CM

## 2011-12-31 DIAGNOSIS — M542 Cervicalgia: Secondary | ICD-10-CM

## 2011-12-31 MED ORDER — DIAZEPAM 10 MG PO TABS: 10.0000 mg | ORAL_TABLET | Freq: Four times a day (QID) | ORAL | Status: AC | PRN

## 2011-12-31 NOTE — Progress Notes (Signed)
Patient ID: [name], male   DOB: 1940-08-18, 71 y.o.   MRN: [id number] Chief Complaint  Patient presents with  . Shoulder Pain    RT shoulder pain, DOS 11/24/11 (RT SHOULDER DEBRIDEMENT)    BP 132/60  Ht 6' (1.829 m)  Wt 206 lb (93.441 kg)  BMI 27.94 kg/m2  The patient underwent cervical fusion 2 weeks from today. He also underwent irrigation debridement I shoulder joint effusion with chronic rotator cuff tear deficiency. He had a sterile culture.  He presented today complaining of shoulder pain but he's actually having pain in his cervical spine and his right trapezius muscle and right periscapular area. He has no pain over his right shoulder incision there is no redness is no swelling there is no pain with range of motion of the joint.  His neck is slightly tender at C7-T1 and T2 and also in the right trapezius muscle. Also in the right parascapular region.  His surgical incision is clean there is no swelling there is no erythema  He came in in his immobilizer as advised by his neurosurgeon  I think he is just having some muscular fatigue from the surgery. He has no neurologic complaints or findings.  The x-ray of the shoulder was normal with 2 suture anchors noted from previous surgery no joint space narrowing no evidence of infection  He is advised to call the neurosurgeon I will send notes from today's visit. I increased his Valium.

## 2011-12-31 NOTE — Patient Instructions (Signed)
Use heat on neck and shoulder   Increase valium  To 10 mg q 6 hours   Call Dr Jeral Fruit today

## 2012-01-04 ENCOUNTER — Encounter (HOSPITAL_COMMUNITY): Payer: Self-pay

## 2012-01-04 ENCOUNTER — Emergency Department (HOSPITAL_COMMUNITY)
Admission: EM | Admit: 2012-01-04 | Discharge: 2012-01-04 | Disposition: A | Discharge status: Home / Self Care | Payer: Medicare Other | Attending: Emergency Medicine | Admitting: Emergency Medicine

## 2012-01-04 DIAGNOSIS — M25519 Pain in unspecified shoulder: Secondary | ICD-10-CM | POA: Insufficient documentation

## 2012-01-04 DIAGNOSIS — I2510 Atherosclerotic heart disease of native coronary artery without angina pectoris: Secondary | ICD-10-CM | POA: Insufficient documentation

## 2012-01-04 DIAGNOSIS — E785 Hyperlipidemia, unspecified: Secondary | ICD-10-CM | POA: Insufficient documentation

## 2012-01-04 DIAGNOSIS — I10 Essential (primary) hypertension: Secondary | ICD-10-CM | POA: Insufficient documentation

## 2012-01-04 DIAGNOSIS — M549 Dorsalgia, unspecified: Secondary | ICD-10-CM | POA: Insufficient documentation

## 2012-01-04 LAB — DIFFERENTIAL
Eosinophils Relative: 5 % (ref 0–5)
Lymphocytes Relative: 17 % (ref 12–46)
Lymphs Abs: 1.4 10*3/uL (ref 0.7–4.0)
Monocytes Absolute: 0.8 10*3/uL (ref 0.1–1.0)
Monocytes Relative: 9 % (ref 3–12)

## 2012-01-04 LAB — BASIC METABOLIC PANEL
CO2: 29 mEq/L (ref 19–32)
Calcium: 9.8 mg/dL (ref 8.4–10.5)
Creatinine, Ser: 1.4 mg/dL — ABNORMAL HIGH (ref 0.50–1.35)
Glucose, Bld: 122 mg/dL — ABNORMAL HIGH (ref 70–99)

## 2012-01-04 LAB — CBC
HCT: 32.3 % — ABNORMAL LOW (ref 39.0–52.0)
MCV: 85.2 fL (ref 78.0–100.0)
RBC: 3.79 MIL/uL — ABNORMAL LOW (ref 4.22–5.81)
WBC: 8.2 10*3/uL (ref 4.0–10.5)

## 2012-01-04 MED ORDER — HYDROMORPHONE HCL PF 1 MG/ML IJ SOLN
1.0000 mg | Freq: Once | INTRAMUSCULAR | Status: AC
  Administered 2012-01-04: 1 mg via INTRAVENOUS
  Filled 2012-01-04: qty 1

## 2012-01-04 MED ORDER — OXYCODONE-ACETAMINOPHEN 5-325 MG PO TABS: ORAL_TABLET | ORAL | Status: DC

## 2012-01-04 MED ORDER — KETOROLAC TROMETHAMINE 30 MG/ML IJ SOLN
30.0000 mg | Freq: Once | INTRAMUSCULAR | Status: AC
  Administered 2012-01-04: 30 mg via INTRAVENOUS
  Filled 2012-01-04: qty 1

## 2012-01-04 MED ORDER — ONDANSETRON HCL 4 MG/2ML IJ SOLN
4.0000 mg | Freq: Once | INTRAMUSCULAR | Status: AC
  Administered 2012-01-04: 4 mg via INTRAVENOUS
  Filled 2012-01-04: qty 2

## 2012-01-04 MED ORDER — SODIUM CHLORIDE 0.9 % IV SOLN
Freq: Once | INTRAVENOUS | Status: AC
  Administered 2012-01-04: 09:00:00 via INTRAVENOUS

## 2012-01-04 NOTE — ED Provider Notes (Signed)
History     CSN: [id number]  Arrival date & time 01/04/12  0750   First MD Initiated Contact with Patient 01/04/12 [phone]      Chief Complaint  Patient presents with  . Back Pain    (Consider location/radiation/quality/duration/timing/severity/associated sxs/prior treatment) HPI Comments: Patient complains of pain to his right shoulder and upper back for several weeks. He states pain came worse last with 3 days. Patient had a cervical corpectomy on 12/17/2011 performed by Dr. Jeral Fruit. He states that he had an infection in his right shoulder and had a surgical debridement of the shoulder joint performed by Dr. Romeo Apple and the patient is concerned that the infection may be reoccurring. He was seen by Dr. Romeo Apple 4 days ago and his Valium dosage was increased. He states this is only helped slightly with his pain. He describes the pain as an aching sensation to his shoulder is worse with movement. He has been wearing a Aspen immobilizer day and night history advised her Careers adviser. Patient denies any chest pain, shortness of breath, numbness, or weakness of his upper extremities. He also denies any fever or chills, redness or swelling of his neck or shoulder incisions.  The history is provided by the patient.    Past Medical History  Diagnosis Date  . HTN (hypertension)   . Hyperlipidemia   . Coronary artery disease     IN 2000   STENT PLACED IN 2012  . Small bowel problem     HAD ALOT OF SCAR TISSUE FROM PREVIOUS SURGERIES..NG WAS INSERTED ...Marland KitchenMarland KitchenNO SURGERY NEEDED...Marland KitchenMarland KitchenIN FOR 8 DAYS  . Disorder of blood     BEEN TREATED BY DERMATOLOGIST X 4 YRS..."BLOOD BLISTERS"  . Arthritis   . Gout   . Anxiety   . Hx MRSA infection     rt shoulder  . Pneumonia     "I've had it 3-4 times"    Past Surgical History  Procedure Date  . Sternal surg 2000    HAS HAD 5-6 ON HIS STERNUM; "caught MRSA in it"  . Lumbar laminectomy/decompression microdiscectomy 06/13/2011    Procedure: LUMBAR  LAMINECTOMY/DECOMPRESSION MICRODISCECTOMY;  Surgeon: Karn Cassis;  Location: [facility];  Service: Neurosurgery;  Laterality: N/A;  Lumbar three, lumbar four-five Laminectomy  . Eye surgery     bilateral cataract  . Anterior cervical corpectomy 12/17/11  . Incision and drainage of wound ~ 20ll; 12/03/11    "had infection in my right"  . Shoulder open rotator cuff repair ~ 2011    right  . Peripherally inserted central catheter insertion 2011 & 11/2011  . Cataract extraction w/ intraocular lens  implant, bilateral ? 2011  . Coronary artery bypass graft 2000    CABG X5  . Back surgery   . Tonsillectomy 1949  . Cholecystectomy 2006 "or after"  . Coronary angioplasty with stent placement 2012  . Anterior cervical corpectomy 12/17/2011    Procedure: ANTERIOR CERVICAL CORPECTOMY;  Surgeon: Karn Cassis, MD;  Location: [facility];  Service: Neurosurgery;  Laterality: N/A;  Anterior Cervical Decompression Fusion Five to Thoracic Two with plating    Family History  Problem Relation Age of Onset  . Heart disease    . Hypertension Mother   . Hypertension Maternal Aunt   . Hypertension Maternal Uncle   . Anesthesia problems Neg Hx   . Hypotension Neg Hx   . Malignant hyperthermia Neg Hx   . Pseudochol deficiency Neg Hx     History  Substance Use Topics  .  Smoking status: Former Smoker -- 1.0 packs/day for 1 years    Types: Cigarettes    Quit date: 12/27/1998  . Smokeless tobacco: Never Used  . Alcohol Use: No      Review of Systems  Constitutional: Negative for fever, chills, activity change and appetite change.  HENT: Negative for trouble swallowing, neck pain and neck stiffness.   Respiratory: Negative for chest tightness and shortness of breath.   Cardiovascular: Negative for chest pain.  Gastrointestinal: Negative for nausea, vomiting and abdominal pain.  Genitourinary: Negative for flank pain.  Musculoskeletal: Positive for back pain and arthralgias.  Neurological:  Negative for dizziness, facial asymmetry, weakness, light-headedness, numbness and headaches.  All other systems reviewed and are negative.    Allergies  Morphine and Metoprolol  Home Medications   Current Outpatient Rx  Name Route Sig Dispense Refill  . ASPIRIN EC 81 MG PO TBEC Oral Take 81 mg by mouth daily.    Marland Kitchen CITALOPRAM HYDROBROMIDE 40 MG PO TABS Oral Take 40 mg by mouth daily.    Marland Kitchen DIAZEPAM 10 MG PO TABS Oral Take 1 tablet (10 mg total) by mouth every 6 (six) hours as needed for anxiety. 28 tablet 0  . DOXAZOSIN MESYLATE 8 MG PO TABS Oral Take 8 mg by mouth at bedtime.     Marland Kitchen HYDROCODONE-ACETAMINOPHEN 10-325 MG PO TABS Oral Take 2 tablets by mouth every 4 (four) hours as needed. For pain    . NITROGLYCERIN 0.4 MG SL SUBL Sublingual Place 0.4 mg under the tongue every 5 (five) minutes as needed. For chest pain    . PREDNISONE 20 MG PO TABS Oral Take 20 mg by mouth daily.    Marland Kitchen SIMVASTATIN 80 MG PO TABS Oral Take 40 mg by mouth at bedtime.      BP 152/74  Pulse 93  Temp(Src) 98 F (36.7 C) (Oral)  Resp 20  Ht 6' (1.829 m)  Wt 200 lb (90.719 kg)  BMI 27.12 kg/m2  SpO2 92%  Physical Exam  Nursing note and vitals reviewed. Constitutional: He is oriented to person, place, and time. He appears well-developed and well-nourished. No distress.  HENT:  Head: Normocephalic and atraumatic.  Mouth/Throat: Oropharynx is clear and moist.  Eyes: EOM are normal. Pupils are equal, round, and reactive to light.  Neck: Normal range of motion and phonation normal. Neck supple. Muscular tenderness present. No spinous process tenderness present. No rigidity. No edema and no erythema present.         Patient has a surgical incision to the anterior neck. Incision appears to be well-healed. There is no erythema, edema, or drainage noted.  Cardiovascular: Normal rate, regular rhythm, normal heart sounds and intact distal pulses.   No murmur heard. Pulmonary/Chest: Effort normal and breath  sounds normal. No stridor.  Musculoskeletal: He exhibits tenderness. He exhibits no edema.       Right shoulder: He exhibits tenderness and spasm. He exhibits normal range of motion, no bony tenderness, no swelling, no effusion, no crepitus, no deformity, no laceration, normal pulse and normal strength.       Arms:      Patient has tenderness to palpation along the border of the right scapula and right trapezius muscle. No erythema, edema, or drainage from the surgical site. Radial pulse is brisk, distal sensation is intact. Cap refill is less than 2 seconds. Grip strengths are strong and equal bilaterally.  Pain to the shoulder it is reproduced with movement and palpation  Lymphadenopathy:  He has no cervical adenopathy.  Neurological: He is alert and oriented to person, place, and time. No cranial nerve deficit or sensory deficit. He exhibits normal muscle tone. Coordination normal.  Reflex Scores:      Tricep reflexes are 2+ on the right side and 2+ on the left side.      Bicep reflexes are 2+ on the right side and 2+ on the left side. Skin: Skin is warm and dry.    ED Course  Procedures (including critical care time)  Results for orders placed during the hospital encounter of 01/04/12  CBC      Component Value Range   WBC 8.2  4.0 - 10.5 (K/uL)   RBC 3.79 (*) 4.22 - 5.81 (MIL/uL)   Hemoglobin 10.6 (*) 13.0 - 17.0 (g/dL)   HCT 95.2 (*) 84.1 - 52.0 (%)   MCV 85.2  78.0 - 100.0 (fL)   MCH 28.0  26.0 - 34.0 (pg)   MCHC 32.8  30.0 - 36.0 (g/dL)   RDW 32.4 (*) 40.1 - 15.5 (%)   Platelets 234  150 - 400 (K/uL)  DIFFERENTIAL      Component Value Range   Neutrophils Relative 68  43 - 77 (%)   Neutro Abs 5.6  1.7 - 7.7 (K/uL)   Lymphocytes Relative 17  12 - 46 (%)   Lymphs Abs 1.4  0.7 - 4.0 (K/uL)   Monocytes Relative 9  3 - 12 (%)   Monocytes Absolute 0.8  0.1 - 1.0 (K/uL)   Eosinophils Relative 5  0 - 5 (%)   Eosinophils Absolute 0.4  0.0 - 0.7 (K/uL)   Basophils Relative 0  0 -  1 (%)   Basophils Absolute 0.0  0.0 - 0.1 (K/uL)  BASIC METABOLIC PANEL      Component Value Range   Sodium 136  135 - 145 (mEq/L)   Potassium 4.5  3.5 - 5.1 (mEq/L)   Chloride 96  96 - 112 (mEq/L)   CO2 29  19 - 32 (mEq/L)   Glucose, Bld 122 (*) 70 - 99 (mg/dL)   BUN 19  6 - 23 (mg/dL)   Creatinine, Ser 0.27 (*) 0.50 - 1.35 (mg/dL)   Calcium 9.8  8.4 - 25.3 (mg/dL)   GFR calc non Af Amer 50 (*) >90 (mL/min)   GFR calc Af Amer 58 (*) >90 (mL/min)         MDM    Patient s/p cervical corpectomy on 12/17/11 by Dr. Jeral Fruit.  Seen by Dr. Romeo Apple on 12/31/11.  Charts reviewed. Patient has also been evaluated by the emergency department physician. Pain has improved, patient is feeling much better. He has tenderness to palpation along the right trapezius muscle and along the border of the right scapula. No edema, drainage or erythema along his surgical incision. No edema or decreased range of motion of the right shoulder joint. No focal neuro deficits on exam. He is nontoxic appearing. He ambulates without any difficulty. Patient has an appointment with his neurosurgeon at the end of the month. He's currently taking Valium every 6 hours for muscle spasms and I will prescribe Percocet for pain as patient is stating that he does not have pain medications at home at this time.   Patient also seen by EDP and care plan discussed  Previous medical charts, nursing notes and vitals signs from this visit were reviewed by me   All laboratory results and/or imaging results performed on this visit, if applicable, were  reviewed by me and discussed with the patient and/or parent as well as recommendation for follow-up    MEDICATIONS GIVEN IN ED: dilaudid, zofran, toradol IV      PRESCRIPTIONS GIVEN AT DISCHARGE:  Percocet # 20    Pt stable in ED with no significant deterioration in condition. Pt feels improved after observation and/or treatment in ED. Patient / Family / Caregiver understand and  agree with initial ED impression and plan with expectations set for ED visit.  Patient agrees to return to ED for any worsening symptoms    Klay Sobotka L. Lynnetta Tom, [place] 01/04/12 1003

## 2012-01-04 NOTE — ED Notes (Signed)
Staying in room until wife arrives. nad

## 2012-01-04 NOTE — ED Notes (Signed)
Pt had neck surgery three weeks ago at [facility]. States he is having pain in upper back that started last night

## 2012-01-04 NOTE — Discharge Instructions (Signed)
Shoulder Pain  The shoulder is a ball and socket joint. Many muscles and tendons hold the joint together. Many types of injuries and medical problems can cause pain in one or more parts of the shoulder.  HOME CARE   If your doctor feels the problem is not serious, it may help to do the following:  · Put ice on the area.  · Put ice in a plastic bag.  · Place a towel between your skin and the bag.  · Leave the ice on for 15 to 20 minutes, 3 to 4 times a day.  · Do this for the first 2 day or as told by your doctor.  · Stop using cold packs if they do not help with the pain.  · Do not take your sling off (except to shower or bathe) until you see your doctor. When taking off the sling, move the arm as little as possible.  · Take medicine as told by your doctor.  · Keep all follow-up appointments.  GET HELP RIGHT AWAY IF:   · The arm, hand, or fingers are numb or tingling.  · The arm, hand, or fingers are puffy (swollen), painful, or turn white or blue.  · You have trouble moving your hand and fingers on the injured side.  · You have chest pain or shortness of breath.  · New pain happens in the arm, hand, or fingers.  · The hand or fingers on the injured side become cold.  · The medicine is not helping the pain go away.  MAKE SURE YOU:   · Understand these instructions.  · Will watch your condition.  · Will get help right away if you are not doing well or get worse.  Document Released: 12/31/2007 Document Revised: 07/03/2011 Document Reviewed: 12/31/2007  [employer]® Patient Information ©2012 [employer].

## 2012-01-06 NOTE — ED Provider Notes (Signed)
Medical screening examination/treatment/procedure(s) were conducted as a shared visit with non-physician practitioner(s) and myself.  I personally evaluated the patient during the encounter.  Seen by me.  Pain appears to be muscular.  No evidence of infection  Donnetta Hutching, MD 01/06/12 [phone]

## 2012-01-08 ENCOUNTER — Emergency Department (HOSPITAL_COMMUNITY)
Admission: EM | Admit: 2012-01-08 | Discharge: 2012-01-08 | Disposition: A | Discharge status: Home / Self Care | Payer: Medicare Other | Attending: Emergency Medicine | Admitting: Emergency Medicine

## 2012-01-08 ENCOUNTER — Emergency Department (HOSPITAL_COMMUNITY): Payer: Medicare Other

## 2012-01-08 ENCOUNTER — Other Ambulatory Visit (HOSPITAL_COMMUNITY): Payer: Self-pay | Admitting: Pulmonary Disease

## 2012-01-08 ENCOUNTER — Encounter (HOSPITAL_COMMUNITY): Payer: Self-pay | Admitting: *Deleted

## 2012-01-08 DIAGNOSIS — G8918 Other acute postprocedural pain: Secondary | ICD-10-CM

## 2012-01-08 DIAGNOSIS — I10 Essential (primary) hypertension: Secondary | ICD-10-CM | POA: Insufficient documentation

## 2012-01-08 DIAGNOSIS — E785 Hyperlipidemia, unspecified: Secondary | ICD-10-CM | POA: Insufficient documentation

## 2012-01-08 DIAGNOSIS — Z79899 Other long term (current) drug therapy: Secondary | ICD-10-CM | POA: Insufficient documentation

## 2012-01-08 DIAGNOSIS — Z8614 Personal history of Methicillin resistant Staphylococcus aureus infection: Secondary | ICD-10-CM | POA: Insufficient documentation

## 2012-01-08 DIAGNOSIS — M542 Cervicalgia: Secondary | ICD-10-CM

## 2012-01-08 DIAGNOSIS — Z9861 Coronary angioplasty status: Secondary | ICD-10-CM | POA: Insufficient documentation

## 2012-01-08 DIAGNOSIS — M129 Arthropathy, unspecified: Secondary | ICD-10-CM | POA: Insufficient documentation

## 2012-01-08 DIAGNOSIS — Z7982 Long term (current) use of aspirin: Secondary | ICD-10-CM | POA: Insufficient documentation

## 2012-01-08 DIAGNOSIS — M109 Gout, unspecified: Secondary | ICD-10-CM | POA: Insufficient documentation

## 2012-01-08 DIAGNOSIS — I2510 Atherosclerotic heart disease of native coronary artery without angina pectoris: Secondary | ICD-10-CM | POA: Insufficient documentation

## 2012-01-08 DIAGNOSIS — R079 Chest pain, unspecified: Secondary | ICD-10-CM | POA: Insufficient documentation

## 2012-01-08 DIAGNOSIS — Z87891 Personal history of nicotine dependence: Secondary | ICD-10-CM | POA: Insufficient documentation

## 2012-01-08 MED ORDER — HYDROMORPHONE HCL PF 1 MG/ML IJ SOLN
1.0000 mg | Freq: Once | INTRAMUSCULAR | Status: AC
  Administered 2012-01-08: 1 mg via INTRAVENOUS
  Filled 2012-01-08: qty 1

## 2012-01-08 MED ORDER — SODIUM CHLORIDE 0.9 % IV BOLUS (SEPSIS)
1000.0000 mL | INTRAVENOUS | Status: AC
  Administered 2012-01-08: 1000 mL via INTRAVENOUS

## 2012-01-08 MED ORDER — ONDANSETRON HCL 4 MG/2ML IJ SOLN
4.0000 mg | Freq: Once | INTRAMUSCULAR | Status: AC
  Administered 2012-01-08: 4 mg via INTRAVENOUS
  Filled 2012-01-08: qty 2

## 2012-01-08 NOTE — ED Notes (Signed)
Pt reporting pain in right side and into back.  Reports pain began 3-4 days ago.  No nausea, or vomiting.  Pt does report occasional non-productive cough.

## 2012-01-08 NOTE — Discharge Instructions (Signed)
Your xray does NOT show any signs of infection or congestion. Follow up with [name]. Ask about a long acting morphine to go with the short acting you are already on. Be very aggressive about making sure you have a bowel movement daily while on pain medicines. Use a stool softener twice a day, Miralax daily if needed.

## 2012-01-08 NOTE — ED Provider Notes (Signed)
History     CSN: [id number]  Arrival date & time 01/08/12  8119   First MD Initiated Contact with Patient 01/08/12 [phone]      Chief Complaint  Patient presents with  . Pleurisy    (Consider location/radiation/quality/duration/timing/severity/associated sxs/prior treatment) HPI [name] is a 71 y.o. male with a h/o CAD s/p CABG, recent prolonged treatment of MRSA in the right shoulder, recent  cervical spine surgery , who presents to the Emergency Department complaining of  Right sided chest pain that is sharp and radiates through to the right infrascapular area that began 4 days ago and became worse this morning when he woke up. He has recently had a change in analgesics from percocet at increasing doses to MS IR with some relief of pain except the pain this morning. Denies fever, chills, shortness of breath, nausea,change in use or strength in the right arm.    PCP [name] Neurosurgeon Dr. Jeral Fruit  Past Medical History  Diagnosis Date  . HTN (hypertension)   . Hyperlipidemia   . Coronary artery disease     IN 2000   STENT PLACED IN 2012  . Small bowel problem     HAD ALOT OF SCAR TISSUE FROM PREVIOUS SURGERIES..NG WAS INSERTED ...Marland KitchenMarland KitchenNO SURGERY NEEDED...Marland KitchenMarland KitchenIN FOR 8 DAYS  . Disorder of blood     BEEN TREATED BY DERMATOLOGIST X 4 YRS..."BLOOD BLISTERS"  . Arthritis   . Gout   . Anxiety   . Hx MRSA infection     rt shoulder  . Pneumonia     "I've had it 3-4 times"    Past Surgical History  Procedure Date  . Sternal surg 2000    HAS HAD 5-6 ON HIS STERNUM; "caught MRSA in it"  . Lumbar laminectomy/decompression microdiscectomy 06/13/2011    Procedure: LUMBAR LAMINECTOMY/DECOMPRESSION MICRODISCECTOMY;  Surgeon: Karn Cassis;  Location: [facility];  Service: Neurosurgery;  Laterality: N/A;  Lumbar three, lumbar four-five Laminectomy  . Eye surgery     bilateral cataract  . Anterior cervical corpectomy 12/17/11  . Incision and drainage of wound ~ 20ll; 12/03/11      "had infection in my right"  . Shoulder open rotator cuff repair ~ 2011    right  . Peripherally inserted central catheter insertion 2011 & 11/2011  . Cataract extraction w/ intraocular lens  implant, bilateral ? 2011  . Coronary artery bypass graft 2000    CABG X5  . Back surgery   . Tonsillectomy 1949  . Cholecystectomy 2006 "or after"  . Coronary angioplasty with stent placement 2012  . Anterior cervical corpectomy 12/17/2011    Procedure: ANTERIOR CERVICAL CORPECTOMY;  Surgeon: Karn Cassis, MD;  Location: [facility];  Service: Neurosurgery;  Laterality: N/A;  Anterior Cervical Decompression Fusion Five to Thoracic Two with plating    Family History  Problem Relation Age of Onset  . Heart disease    . Hypertension Mother   . Hypertension Maternal Aunt   . Hypertension Maternal Uncle   . Anesthesia problems Neg Hx   . Hypotension Neg Hx   . Malignant hyperthermia Neg Hx   . Pseudochol deficiency Neg Hx     History  Substance Use Topics  . Smoking status: Former Smoker -- 1.0 packs/day for 1 years    Types: Cigarettes    Quit date: 12/27/1998  . Smokeless tobacco: Never Used  . Alcohol Use: No      Review of Systems  Constitutional: Negative for fever.  10 Systems reviewed and are negative for acute change except as noted in the HPI.  HENT: Negative for congestion.   Eyes: Negative for discharge and redness.  Respiratory: Negative for cough, chest tightness, shortness of breath and wheezing.        Chest wall pain   Cardiovascular: Negative for chest pain.  Gastrointestinal: Negative for vomiting and abdominal pain.  Musculoskeletal: Negative for back pain.       Sub scapular pain  Skin: Negative for rash.  Neurological: Negative for syncope, numbness and headaches.  Psychiatric/Behavioral:       No behavior change.    Allergies  Morphine and Metoprolol  Home Medications   Current Outpatient Rx  Name Route Sig Dispense Refill  . ASPIRIN EC  81 MG PO TBEC Oral Take 81 mg by mouth daily.    Marland Kitchen CITALOPRAM HYDROBROMIDE 40 MG PO TABS Oral Take 40 mg by mouth daily.    Marland Kitchen DIAZEPAM 10 MG PO TABS Oral Take 1 tablet (10 mg total) by mouth every 6 (six) hours as needed for anxiety. 28 tablet 0  . DOXAZOSIN MESYLATE 8 MG PO TABS Oral Take 8 mg by mouth at bedtime.     Marland Kitchen HYDROCODONE-ACETAMINOPHEN 10-325 MG PO TABS Oral Take 2 tablets by mouth every 4 (four) hours as needed. For pain    . MORPHINE SULFATE ER 15 MG PO TBCR Oral Take 15 mg by mouth 2 (two) times daily.    Marland Kitchen NITROGLYCERIN 0.4 MG SL SUBL Sublingual Place 0.4 mg under the tongue every 5 (five) minutes as needed. For chest pain    . OXYCODONE-ACETAMINOPHEN 5-325 MG PO TABS  Take one-two tabs po q 4-6 hrs prn pain 20 tablet 0  . PREDNISONE 10 MG PO TABS Oral Take 15 mg by mouth daily.    Marland Kitchen SIMVASTATIN 80 MG PO TABS Oral Take 40 mg by mouth at bedtime.      BP 160/87  Pulse 71  Temp 98.5 F (36.9 C) (Oral)  Resp 18  Ht 6' (1.829 m)  Wt 205 lb (92.987 kg)  BMI 27.80 kg/m2  SpO2 97%  Physical Exam  Nursing note and vitals reviewed. Constitutional: He is oriented to person, place, and time. He appears well-developed and well-nourished. No distress.       Awake, alert, nontoxic appearance.  HENT:  Head: Normocephalic and atraumatic.  Eyes: EOM are normal. Pupils are equal, round, and reactive to light. Right eye exhibits no discharge. Left eye exhibits no discharge.  Neck: Normal range of motion. Neck supple.       Healing incision from surgery. Patient in a neck brace.  Cardiovascular: Normal rate, normal heart sounds and intact distal pulses.   Pulmonary/Chest: Effort normal and breath sounds normal. No respiratory distress. He has no wheezes. He exhibits no tenderness.       No change in pain with deep breathing. Unable to reproduce pain with palpation of the chest wall.  Abdominal: Soft. Bowel sounds are normal. There is no tenderness. There is no rebound.  Musculoskeletal:  He exhibits no tenderness.       Baseline ROM, no obvious new focal weakness.No change in ROM to right shoulder. Shoulder without effusion or warmth.  Neurological: He is alert and oriented to person, place, and time.       Mental status and motor strength appears baseline for patient and situation.  Skin: Skin is warm and dry. No rash noted.  Psychiatric: He has a normal mood and  affect.    ED Course  Procedures (including critical care time)   Date: 01/08/2012  0537  Rate: 64  Rhythm: normal sinus rhythm  QRS Axis: normal  Intervals: normal  ST/T Wave abnormalities: normal  Conduction Disutrbances: none  Narrative Interpretation: unremarkable  Dg Chest 2 View  01/08/2012  *RADIOLOGY REPORT*  Clinical Data: Chest pain  CHEST - 2 VIEW  Comparison: 12/12/2011  Findings: Hyperinflation.  Blunted right costophrenic angle is unchanged.  Calcified right granuloma are unchanged.  Status post CABG.  Surgical clips right axilla.  Interval anterior plate and screw fixation of the lower cervical/upper thoracic spine, incompletely evaluated on this examination.  Multilevel degenerative changes of the thoracic spine. Tendon anchors right shoulder.  High-riding humeral heads suggest underlying rotator cuff pathology.  IMPRESSION: Interval plate and screw fixation of the lower thoracic/upper cervical spine.  Otherwise no interval change.  Hyperinflation with interstitial prominence.  Blunted right costophrenic angle.  Original Report Authenticated By: Waneta Martins, M.D.      MDM  Patient with right sided chest pain with radiation to the right infrascapular area. PE is unremarkable. Chest xray without acute findings. Given the complexity of his recent illnesses, wearing a brace, having an underlying rotator cuff injury on the right side, pain is most likely due to multifactorial causes. He was given analgesic with significant relief of discomfort. Spoke with both patient and his wife re possibility  of long acting MS in addition to MS IR. He has an appointment with his PCP today.Dx testing d/w pt and wife.  Questions answered.  Verb understanding.Pt stable in ED with no significant deterioration in condition.The patient appears reasonably screened and/or stabilized for discharge and I doubt any other medical condition or other [facility] requiring further screening, evaluation, or treatment in the ED at this time prior to discharge.  MDM Reviewed: nursing note and vitals Interpretation: ECG and x-ray           Nicoletta Dress. Colon Branch, MD 01/08/12 1052

## 2012-01-08 NOTE — ED Notes (Signed)
Pt reports right side pain that radiates to the back for the last 3 to 4 days.

## 2012-01-15 ENCOUNTER — Ambulatory Visit (HOSPITAL_COMMUNITY)
Admission: RE | Admit: 2012-01-15 | Discharge: 2012-01-15 | Disposition: A | Discharge status: Home / Self Care | Payer: Medicare Other | Source: Ambulatory Visit | Attending: Pulmonary Disease | Admitting: Pulmonary Disease

## 2012-01-15 DIAGNOSIS — IMO0002 Reserved for concepts with insufficient information to code with codable children: Secondary | ICD-10-CM | POA: Insufficient documentation

## 2012-01-15 DIAGNOSIS — M542 Cervicalgia: Secondary | ICD-10-CM | POA: Insufficient documentation

## 2012-01-15 LAB — HM DEXA SCAN

## 2012-02-03 ENCOUNTER — Encounter (HOSPITAL_COMMUNITY): Payer: Self-pay | Admitting: Oncology

## 2012-03-18 ENCOUNTER — Encounter: Payer: Self-pay | Admitting: Orthopedic Surgery

## 2012-03-18 ENCOUNTER — Ambulatory Visit (INDEPENDENT_AMBULATORY_CARE_PROVIDER_SITE_OTHER): Payer: Medicare Other | Admitting: Orthopedic Surgery

## 2012-03-18 VITALS — BP 140/80 | Ht 72.0 in | Wt 206.0 lb

## 2012-03-18 DIAGNOSIS — M542 Cervicalgia: Secondary | ICD-10-CM | POA: Insufficient documentation

## 2012-03-18 NOTE — Patient Instructions (Signed)
You have received a steroid shot. 15% of patients experience increased pain at the injection site with in the next 24 hours. This is best treated with ice and tylenol extra strength 2 tabs every 8 hours. If you are still having pain please call the office.    

## 2012-03-18 NOTE — Progress Notes (Signed)
Patient ID: [name], male   DOB: [birth year], 71 y.o.   MRN: [id number] Chief Complaint  Patient presents with  . Follow-up    Right shoulder pain.    BP 140/80  Ht 6' (1.829 m)  Wt 206 lb (93.441 kg)  BMI 27.94 kg/m2  RIGHT shoulder pain.  This patient's long history of problems with his RIGHT shoulder including failed rotator cuff repair as palpated by a joint infection. He's had a repeat I&D with a sterile curved culture.  7 cervical fusion.  Presents now with RIGHT shoulder pain in the periscapular region. Mild numbness radiating to his RIGHT elbow. No swelling, tenderness, or erythema around the shoulder joint. He is also having some chest wall pain, which is noncardiac.  Exam shows he is scar over the RIGHT shoulder. No joint effusion, tenderness in the periscapular region, and chest wall. No warmth to the shoulder.  Impression trigger point most likely from overwork of the periscapular musculature.  Injections.  The muscular area at the angle of the scapula has been injected with Depo-Medrol and lidocaine 1%.  Verbal consent was performed. Confirmatory timeout was performed. No complications were noted.

## 2012-03-31 ENCOUNTER — Other Ambulatory Visit: Payer: Self-pay | Admitting: Neurosurgery

## 2012-03-31 DIAGNOSIS — M549 Dorsalgia, unspecified: Secondary | ICD-10-CM

## 2012-03-31 DIAGNOSIS — M5410 Radiculopathy, site unspecified: Secondary | ICD-10-CM

## 2012-03-31 DIAGNOSIS — M542 Cervicalgia: Secondary | ICD-10-CM

## 2012-04-07 ENCOUNTER — Ambulatory Visit
Admission: RE | Admit: 2012-04-07 | Discharge: 2012-04-07 | Disposition: A | Discharge status: Home / Self Care | Payer: Medicare Other | Source: Ambulatory Visit | Attending: Neurosurgery | Admitting: Neurosurgery

## 2012-04-07 DIAGNOSIS — M549 Dorsalgia, unspecified: Secondary | ICD-10-CM

## 2012-04-07 DIAGNOSIS — M542 Cervicalgia: Secondary | ICD-10-CM

## 2012-04-07 DIAGNOSIS — M5410 Radiculopathy, site unspecified: Secondary | ICD-10-CM

## 2012-04-07 MED ORDER — TRIAMCINOLONE ACETONIDE 40 MG/ML IJ SUSP (RADIOLOGY)
60.0000 mg | Freq: Once | INTRAMUSCULAR | Status: AC
  Administered 2012-04-07: 60 mg via INTRAMUSCULAR

## 2012-04-07 MED ORDER — IOHEXOL 300 MG/ML  SOLN
1.0000 mL | Freq: Once | INTRAMUSCULAR | Status: AC | PRN
  Administered 2012-04-07: 1 mL via EPIDURAL

## 2012-04-07 MED ORDER — METHYLPREDNISOLONE ACETATE 40 MG/ML INJ SUSP (RADIOLOG
120.0000 mg | Freq: Once | INTRAMUSCULAR | Status: AC
  Administered 2012-04-07: 120 mg via EPIDURAL

## 2012-06-02 ENCOUNTER — Other Ambulatory Visit: Payer: Self-pay | Admitting: Neurosurgery

## 2012-06-02 DIAGNOSIS — M5410 Radiculopathy, site unspecified: Secondary | ICD-10-CM

## 2012-06-02 DIAGNOSIS — M549 Dorsalgia, unspecified: Secondary | ICD-10-CM

## 2012-06-07 ENCOUNTER — Other Ambulatory Visit: Payer: Self-pay | Admitting: Neurosurgery

## 2012-06-07 ENCOUNTER — Other Ambulatory Visit: Payer: Medicare Other

## 2012-06-07 ENCOUNTER — Ambulatory Visit
Admission: RE | Admit: 2012-06-07 | Discharge: 2012-06-07 | Disposition: A | Discharge status: Home / Self Care | Payer: Medicare Other | Source: Ambulatory Visit | Attending: Neurosurgery | Admitting: Neurosurgery

## 2012-06-07 VITALS — BP 183/90 | HR 54

## 2012-06-07 DIAGNOSIS — M549 Dorsalgia, unspecified: Secondary | ICD-10-CM

## 2012-06-07 DIAGNOSIS — M5410 Radiculopathy, site unspecified: Secondary | ICD-10-CM

## 2012-06-07 MED ORDER — IOHEXOL 180 MG/ML  SOLN
1.0000 mL | Freq: Once | INTRAMUSCULAR | Status: AC | PRN
  Administered 2012-06-07: 1 mL via EPIDURAL

## 2012-06-07 MED ORDER — METHYLPREDNISOLONE ACETATE 40 MG/ML INJ SUSP (RADIOLOG
120.0000 mg | Freq: Once | INTRAMUSCULAR | Status: AC
  Administered 2012-06-07: 120 mg via EPIDURAL

## 2012-08-10 DIAGNOSIS — M25559 Pain in unspecified hip: Secondary | ICD-10-CM | POA: Diagnosis not present

## 2012-08-10 DIAGNOSIS — M545 Low back pain: Secondary | ICD-10-CM | POA: Diagnosis not present

## 2012-08-10 DIAGNOSIS — M5430 Sciatica, unspecified side: Secondary | ICD-10-CM | POA: Diagnosis not present

## 2012-08-12 DIAGNOSIS — M5430 Sciatica, unspecified side: Secondary | ICD-10-CM | POA: Diagnosis not present

## 2012-08-12 DIAGNOSIS — M545 Low back pain: Secondary | ICD-10-CM | POA: Diagnosis not present

## 2012-08-12 DIAGNOSIS — M25559 Pain in unspecified hip: Secondary | ICD-10-CM | POA: Diagnosis not present

## 2012-08-17 DIAGNOSIS — M25559 Pain in unspecified hip: Secondary | ICD-10-CM | POA: Diagnosis not present

## 2012-08-17 DIAGNOSIS — M5430 Sciatica, unspecified side: Secondary | ICD-10-CM | POA: Diagnosis not present

## 2012-08-17 DIAGNOSIS — M545 Low back pain: Secondary | ICD-10-CM | POA: Diagnosis not present

## 2012-08-19 DIAGNOSIS — M545 Low back pain: Secondary | ICD-10-CM | POA: Diagnosis not present

## 2012-08-19 DIAGNOSIS — M5430 Sciatica, unspecified side: Secondary | ICD-10-CM | POA: Diagnosis not present

## 2012-08-19 DIAGNOSIS — M25559 Pain in unspecified hip: Secondary | ICD-10-CM | POA: Diagnosis not present

## 2012-08-24 DIAGNOSIS — M25559 Pain in unspecified hip: Secondary | ICD-10-CM | POA: Diagnosis not present

## 2012-08-24 DIAGNOSIS — M545 Low back pain: Secondary | ICD-10-CM | POA: Diagnosis not present

## 2012-08-24 DIAGNOSIS — M5430 Sciatica, unspecified side: Secondary | ICD-10-CM | POA: Diagnosis not present

## 2012-08-25 ENCOUNTER — Other Ambulatory Visit (HOSPITAL_COMMUNITY): Payer: Self-pay | Admitting: Neurosurgery

## 2012-08-25 DIAGNOSIS — M5410 Radiculopathy, site unspecified: Secondary | ICD-10-CM

## 2012-08-25 DIAGNOSIS — M549 Dorsalgia, unspecified: Secondary | ICD-10-CM

## 2012-08-26 ENCOUNTER — Ambulatory Visit (HOSPITAL_COMMUNITY)
Admission: RE | Admit: 2012-08-26 | Discharge: 2012-08-26 | Disposition: A | Discharge status: Home / Self Care | Payer: Medicare Other | Source: Ambulatory Visit | Attending: Neurosurgery | Admitting: Neurosurgery

## 2012-08-26 ENCOUNTER — Other Ambulatory Visit (HOSPITAL_COMMUNITY): Payer: Self-pay | Admitting: Neurosurgery

## 2012-08-26 DIAGNOSIS — R209 Unspecified disturbances of skin sensation: Secondary | ICD-10-CM | POA: Diagnosis not present

## 2012-08-26 DIAGNOSIS — M7130 Other bursal cyst, unspecified site: Secondary | ICD-10-CM | POA: Insufficient documentation

## 2012-08-26 DIAGNOSIS — M5410 Radiculopathy, site unspecified: Secondary | ICD-10-CM

## 2012-08-26 DIAGNOSIS — M549 Dorsalgia, unspecified: Secondary | ICD-10-CM

## 2012-08-26 DIAGNOSIS — M545 Low back pain, unspecified: Secondary | ICD-10-CM | POA: Insufficient documentation

## 2012-08-26 DIAGNOSIS — M5430 Sciatica, unspecified side: Secondary | ICD-10-CM | POA: Diagnosis not present

## 2012-08-26 DIAGNOSIS — M48061 Spinal stenosis, lumbar region without neurogenic claudication: Secondary | ICD-10-CM | POA: Diagnosis not present

## 2012-08-26 DIAGNOSIS — M5137 Other intervertebral disc degeneration, lumbosacral region: Secondary | ICD-10-CM | POA: Insufficient documentation

## 2012-08-26 DIAGNOSIS — M25559 Pain in unspecified hip: Secondary | ICD-10-CM | POA: Diagnosis not present

## 2012-08-26 DIAGNOSIS — M5126 Other intervertebral disc displacement, lumbar region: Secondary | ICD-10-CM | POA: Diagnosis not present

## 2012-08-26 DIAGNOSIS — M51379 Other intervertebral disc degeneration, lumbosacral region without mention of lumbar back pain or lower extremity pain: Secondary | ICD-10-CM | POA: Insufficient documentation

## 2012-08-26 LAB — POCT I-STAT, CHEM 8
BUN: 21 mg/dL (ref 6–23)
Calcium, Ion: 1.19 mmol/L (ref 1.13–1.30)
HCT: 34 % — ABNORMAL LOW (ref 39.0–52.0)
TCO2: 26 mmol/L (ref 0–100)

## 2012-08-26 MED ORDER — GADOBENATE DIMEGLUMINE 529 MG/ML IV SOLN
10.0000 mL | Freq: Once | INTRAVENOUS | Status: AC | PRN
  Administered 2012-08-26: 10 mL via INTRAVENOUS

## 2012-08-26 NOTE — Progress Notes (Signed)
IV started in left antecubital with 22g angiocath. Blood obtained for Creatnine level.  

## 2012-08-30 ENCOUNTER — Encounter (HOSPITAL_COMMUNITY): Payer: Self-pay | Admitting: Pharmacy Technician

## 2012-08-30 ENCOUNTER — Other Ambulatory Visit: Payer: Self-pay | Admitting: Neurosurgery

## 2012-08-30 DIAGNOSIS — M4714 Other spondylosis with myelopathy, thoracic region: Secondary | ICD-10-CM | POA: Diagnosis not present

## 2012-08-30 DIAGNOSIS — M5137 Other intervertebral disc degeneration, lumbosacral region: Secondary | ICD-10-CM | POA: Diagnosis not present

## 2012-08-30 DIAGNOSIS — M545 Low back pain: Secondary | ICD-10-CM | POA: Diagnosis not present

## 2012-08-30 DIAGNOSIS — IMO0002 Reserved for concepts with insufficient information to code with codable children: Secondary | ICD-10-CM | POA: Diagnosis not present

## 2012-08-31 ENCOUNTER — Encounter (HOSPITAL_COMMUNITY): Payer: Self-pay

## 2012-08-31 ENCOUNTER — Encounter (HOSPITAL_COMMUNITY)
Admission: RE | Admit: 2012-08-31 | Discharge: 2012-08-31 | Disposition: A | Discharge status: Home / Self Care | Payer: Medicare Other | Source: Ambulatory Visit | Attending: Neurosurgery | Admitting: Neurosurgery

## 2012-08-31 LAB — BASIC METABOLIC PANEL
Calcium: 9.9 mg/dL (ref 8.4–10.5)
GFR calc non Af Amer: 54 mL/min — ABNORMAL LOW (ref >90)
Glucose, Bld: 116 mg/dL — ABNORMAL HIGH (ref 70–99)
Sodium: 134 mEq/L — ABNORMAL LOW (ref 135–145)

## 2012-08-31 LAB — CBC
MCH: 29.9 pg (ref 26.0–34.0)
MCHC: 33.2 g/dL (ref 30.0–36.0)
Platelets: 214 10*3/uL (ref 150–400)
RBC: 3.81 MIL/uL — ABNORMAL LOW (ref 4.22–5.81)
RDW: 14.1 % (ref 11.5–15.5)

## 2012-08-31 LAB — SURGICAL PCR SCREEN: MRSA, PCR: NEGATIVE

## 2012-08-31 LAB — TYPE AND SCREEN: ABO/RH(D): O POS

## 2012-08-31 NOTE — Progress Notes (Signed)
Forwarded chart to anesthesia to review cardiac records.

## 2012-08-31 NOTE — Consult Note (Signed)
Anesthesia Chart Review:  Patient is a 72 year old male scheduled for L4-5, L5-S1 diskectomy/fusion by Dr. Jeral Fruit on 09/01/12.  History includes CAD s/p CABG '00 and PTCA/BMS LCx 12/31/10, HTN, anxiety, gout, HLD, former smoker, PNA, "blood blisters" treated by Dermatology.  Primary Cardiologist is Dr. Dietrich Pates, although he has not been seen since his stent in June 2012.  He has tolerated three surgical procedures since then, most recently in May 2013.  I was not asked to evaluate him during his PAT visit.  His most recent labs, CXR, EKG have been reviewed.  Cath from 12/31/10 and echo from 01/16/10 are in Epic for review.  I reviewed cardiac history with Anesthesiologist Dr. Jacklynn Bue. Patient will be evaluated by his assigned anesthesiologist on the day of surgery.  He has tolerated three surgeries since his stent, so if no new/worrisome CV symptoms then would anticipate he could proceed as planned.  Shonna Chock, PA-C 08/31/12 1616

## 2012-08-31 NOTE — Progress Notes (Signed)
08/31/12 1208  OBSTRUCTIVE SLEEP APNEA  Have you ever been diagnosed with sleep apnea through a sleep study? No  Do you snore loudly (loud enough to be heard through closed doors)?  0  Do you often feel tired, fatigued, or sleepy during the daytime? 1  Has anyone observed you stop breathing during your sleep? 0  Do you have, or are you being treated for high blood pressure? 1  BMI more than 35 kg/m2? 0  Age over 71 years old? 1  Neck circumference greater than 40 cm/18 inches? 0 (16 1/2 inches)  Gender: 1  Obstructive Sleep Apnea Score 4   Score 4 or greater  Results sent to PCP

## 2012-08-31 NOTE — Pre-Procedure Instructions (Signed)
[place] [facility]  08/31/2012   Your procedure is scheduled on:  Wednesday August 31, 2012  Report to [facility] at 11:15 AM.  Call this number if you have problems the morning of surgery: [phone]   Remember:   Do not eat food or drink liquids after midnight.   Take these medicines the morning of surgery with A SIP OF WATER: celexa, cardura, nitroglycerin (if needed), oxycodone, prednisone,    Do not wear jewelry, make-up or nail polish.  Do not wear lotions, powders, or perfumes.  Do not shave 48 hours prior to surgery. Men may shave face and neck.  Do not bring valuables to the hospital.  Contacts, dentures or bridgework may not be worn into surgery.  Leave suitcase in the car. After surgery it may be brought to your room.  For patients admitted to the hospital, checkout time is 11:00 AM the day of  discharge.   Patients discharged the day of surgery will not be allowed to drive home.  Name and phone number of your driver: family / friend  Special Instructions: Shower using CHG 2 nights before surgery and the night before surgery.  If you shower the day of surgery use CHG.  Use special wash - you have one bottle of CHG for all showers.  You should use approximately 1/3 of the bottle for each shower.   Please read over the following fact sheets that you were given: Pain Booklet, Coughing and Deep Breathing, Blood Transfusion Information, MRSA Information and Surgical Site Infection Prevention

## 2012-09-01 ENCOUNTER — Inpatient Hospital Stay (HOSPITAL_COMMUNITY): Payer: Medicare Other | Admitting: Vascular Surgery

## 2012-09-01 ENCOUNTER — Inpatient Hospital Stay (HOSPITAL_COMMUNITY): Payer: Medicare Other

## 2012-09-01 ENCOUNTER — Encounter (HOSPITAL_COMMUNITY): Payer: Self-pay | Admitting: Vascular Surgery

## 2012-09-01 ENCOUNTER — Encounter (HOSPITAL_COMMUNITY)
Admission: RE | Disposition: A | Discharge status: Home / Self Care | Payer: Self-pay | Source: Ambulatory Visit | Attending: Neurosurgery

## 2012-09-01 ENCOUNTER — Inpatient Hospital Stay (HOSPITAL_COMMUNITY)
Admission: RE | Admit: 2012-09-01 | Discharge: 2012-09-04 | DRG: 460 | Disposition: A | Discharge status: Home / Self Care | Payer: Medicare Other | Source: Ambulatory Visit | Attending: Neurosurgery | Admitting: Neurosurgery

## 2012-09-01 ENCOUNTER — Encounter (HOSPITAL_COMMUNITY): Payer: Self-pay | Admitting: *Deleted

## 2012-09-01 DIAGNOSIS — I10 Essential (primary) hypertension: Secondary | ICD-10-CM | POA: Diagnosis not present

## 2012-09-01 DIAGNOSIS — Z951 Presence of aortocoronary bypass graft: Secondary | ICD-10-CM | POA: Diagnosis not present

## 2012-09-01 DIAGNOSIS — M7130 Other bursal cyst, unspecified site: Secondary | ICD-10-CM | POA: Diagnosis present

## 2012-09-01 DIAGNOSIS — M48061 Spinal stenosis, lumbar region without neurogenic claudication: Secondary | ICD-10-CM

## 2012-09-01 DIAGNOSIS — M109 Gout, unspecified: Secondary | ICD-10-CM | POA: Diagnosis present

## 2012-09-01 DIAGNOSIS — F411 Generalized anxiety disorder: Secondary | ICD-10-CM | POA: Diagnosis present

## 2012-09-01 DIAGNOSIS — Z01812 Encounter for preprocedural laboratory examination: Secondary | ICD-10-CM | POA: Diagnosis not present

## 2012-09-01 DIAGNOSIS — M4716 Other spondylosis with myelopathy, lumbar region: Secondary | ICD-10-CM | POA: Diagnosis not present

## 2012-09-01 DIAGNOSIS — I2510 Atherosclerotic heart disease of native coronary artery without angina pectoris: Secondary | ICD-10-CM | POA: Diagnosis present

## 2012-09-01 DIAGNOSIS — M4310 Spondylolisthesis, site unspecified: Secondary | ICD-10-CM | POA: Diagnosis not present

## 2012-09-01 DIAGNOSIS — Z87891 Personal history of nicotine dependence: Secondary | ICD-10-CM

## 2012-09-01 DIAGNOSIS — M1990 Unspecified osteoarthritis, unspecified site: Secondary | ICD-10-CM | POA: Diagnosis not present

## 2012-09-01 DIAGNOSIS — Z981 Arthrodesis status: Secondary | ICD-10-CM | POA: Diagnosis not present

## 2012-09-01 DIAGNOSIS — Z9861 Coronary angioplasty status: Secondary | ICD-10-CM

## 2012-09-01 DIAGNOSIS — Q762 Congenital spondylolisthesis: Secondary | ICD-10-CM

## 2012-09-01 DIAGNOSIS — E785 Hyperlipidemia, unspecified: Secondary | ICD-10-CM | POA: Diagnosis present

## 2012-09-01 DIAGNOSIS — Z8614 Personal history of Methicillin resistant Staphylococcus aureus infection: Secondary | ICD-10-CM

## 2012-09-01 DIAGNOSIS — M519 Unspecified thoracic, thoracolumbar and lumbosacral intervertebral disc disorder: Secondary | ICD-10-CM | POA: Diagnosis not present

## 2012-09-01 DIAGNOSIS — M47817 Spondylosis without myelopathy or radiculopathy, lumbosacral region: Secondary | ICD-10-CM | POA: Diagnosis not present

## 2012-09-01 DIAGNOSIS — IMO0002 Reserved for concepts with insufficient information to code with codable children: Secondary | ICD-10-CM | POA: Diagnosis not present

## 2012-09-01 DIAGNOSIS — M4804 Spinal stenosis, thoracic region: Secondary | ICD-10-CM | POA: Diagnosis not present

## 2012-09-01 DIAGNOSIS — M545 Low back pain: Secondary | ICD-10-CM | POA: Diagnosis not present

## 2012-09-01 DIAGNOSIS — M5137 Other intervertebral disc degeneration, lumbosacral region: Secondary | ICD-10-CM | POA: Diagnosis not present

## 2012-09-01 LAB — CBC
HCT: 29.7 % — ABNORMAL LOW (ref 39.0–52.0)
MCH: 29.8 pg (ref 26.0–34.0)
MCV: 90.3 fL (ref 78.0–100.0)
Platelets: 183 10*3/uL (ref 150–400)
RDW: 14.1 % (ref 11.5–15.5)
WBC: 9.8 10*3/uL (ref 4.0–10.5)

## 2012-09-01 LAB — CREATININE, SERUM: GFR calc Af Amer: 62 mL/min — ABNORMAL LOW (ref >90)

## 2012-09-01 SURGERY — POSTERIOR LUMBAR FUSION 2 LEVEL
Anesthesia: General | Site: Back | Wound class: Clean

## 2012-09-01 MED ORDER — BUPIVACAINE LIPOSOME 1.3 % IJ SUSP
INTRAMUSCULAR | Status: DC | PRN
  Administered 2012-09-01: 20 mL

## 2012-09-01 MED ORDER — EPHEDRINE SULFATE 50 MG/ML IJ SOLN
INTRAMUSCULAR | Status: DC | PRN
  Administered 2012-09-01: 5 mg via INTRAVENOUS
  Administered 2012-09-01: 10 mg via INTRAVENOUS

## 2012-09-01 MED ORDER — PHENYLEPHRINE HCL 10 MG/ML IJ SOLN
10.0000 mg | INTRAVENOUS | Status: DC | PRN
  Administered 2012-09-01: 10 ug/min via INTRAVENOUS

## 2012-09-01 MED ORDER — ACETAMINOPHEN 325 MG PO TABS
650.0000 mg | ORAL_TABLET | ORAL | Status: DC | PRN
  Administered 2012-09-03: 650 mg via ORAL
  Filled 2012-09-01: qty 2

## 2012-09-01 MED ORDER — NEOSTIGMINE METHYLSULFATE 1 MG/ML IJ SOLN
INTRAMUSCULAR | Status: DC | PRN
  Administered 2012-09-01: 4 mg via INTRAVENOUS

## 2012-09-01 MED ORDER — OXYCODONE HCL 5 MG PO TABS
ORAL_TABLET | ORAL | Status: AC
  Filled 2012-09-01: qty 1

## 2012-09-01 MED ORDER — HYDROMORPHONE 0.3 MG/ML IV SOLN
INTRAVENOUS | Status: DC
  Administered 2012-09-01 (×2): via INTRAVENOUS
  Administered 2012-09-01: 3.1 mg via INTRAVENOUS
  Administered 2012-09-02: 3.29 mg via INTRAVENOUS
  Administered 2012-09-02: 3.3 mg via INTRAVENOUS
  Administered 2012-09-02: 06:00:00 via INTRAVENOUS
  Administered 2012-09-02: 2.4 mg via INTRAVENOUS
  Administered 2012-09-03: 3.27 mg via INTRAVENOUS
  Administered 2012-09-03: 03:00:00 via INTRAVENOUS
  Filled 2012-09-01 (×4): qty 25

## 2012-09-01 MED ORDER — DOXAZOSIN MESYLATE 8 MG PO TABS
8.0000 mg | ORAL_TABLET | Freq: Every day | ORAL | Status: DC
  Administered 2012-09-01: 8 mg via ORAL
  Filled 2012-09-01 (×4): qty 1

## 2012-09-01 MED ORDER — MENTHOL 3 MG MT LOZG: 1.0000 | LOZENGE | OROMUCOSAL | Status: DC | PRN

## 2012-09-01 MED ORDER — NALOXONE HCL 0.4 MG/ML IJ SOLN: 0.4000 mg | INTRAMUSCULAR | Status: DC | PRN

## 2012-09-01 MED ORDER — DEXAMETHASONE SODIUM PHOSPHATE 4 MG/ML IJ SOLN
INTRAMUSCULAR | Status: DC | PRN
  Administered 2012-09-01: 4 mg via INTRAVENOUS

## 2012-09-01 MED ORDER — LISINOPRIL 20 MG PO TABS
20.0000 mg | ORAL_TABLET | Freq: Every day | ORAL | Status: DC
  Filled 2012-09-01 (×3): qty 1

## 2012-09-01 MED ORDER — DIAZEPAM 5 MG PO TABS
ORAL_TABLET | ORAL | Status: AC
  Filled 2012-09-01: qty 1

## 2012-09-01 MED ORDER — ATORVASTATIN CALCIUM 20 MG PO TABS
20.0000 mg | ORAL_TABLET | Freq: Every day | ORAL | Status: DC
Start: 2012-09-01 — End: 2012-09-04
  Administered 2012-09-01 – 2012-09-04 (×4): 20 mg via ORAL
  Filled 2012-09-01 (×4): qty 1

## 2012-09-01 MED ORDER — HYDROMORPHONE HCL PF 1 MG/ML IJ SOLN
INTRAMUSCULAR | Status: AC
  Administered 2012-09-01: 0.5 mg via INTRAVENOUS
  Filled 2012-09-01: qty 1

## 2012-09-01 MED ORDER — ROCURONIUM BROMIDE 100 MG/10ML IV SOLN
INTRAVENOUS | Status: DC | PRN
  Administered 2012-09-01: 50 mg via INTRAVENOUS

## 2012-09-01 MED ORDER — ZOLPIDEM TARTRATE 5 MG PO TABS: 5.0000 mg | ORAL_TABLET | Freq: Every evening | ORAL | Status: DC | PRN

## 2012-09-01 MED ORDER — HEMOSTATIC AGENTS (NO CHARGE) OPTIME: TOPICAL | Status: DC | PRN

## 2012-09-01 MED ORDER — PHENOL 1.4 % MT LIQD: 1.0000 | OROMUCOSAL | Status: DC | PRN

## 2012-09-01 MED ORDER — ONDANSETRON HCL 4 MG/2ML IJ SOLN
INTRAMUSCULAR | Status: DC | PRN
  Administered 2012-09-01: 4 mg via INTRAVENOUS

## 2012-09-01 MED ORDER — VECURONIUM BROMIDE 10 MG IV SOLR
INTRAVENOUS | Status: DC | PRN
  Administered 2012-09-01 (×2): 2 mg via INTRAVENOUS

## 2012-09-01 MED ORDER — ONDANSETRON HCL 4 MG/2ML IJ SOLN: 4.0000 mg | Freq: Four times a day (QID) | INTRAMUSCULAR | Status: DC | PRN

## 2012-09-01 MED ORDER — CEFAZOLIN SODIUM 1-5 GM-% IV SOLN
1.0000 g | Freq: Three times a day (TID) | INTRAVENOUS | Status: AC
  Administered 2012-09-01 – 2012-09-02 (×2): 1 g via INTRAVENOUS
  Filled 2012-09-01 (×2): qty 50

## 2012-09-01 MED ORDER — PROPOFOL 10 MG/ML IV BOLUS
INTRAVENOUS | Status: DC | PRN
  Administered 2012-09-01: 200 mg via INTRAVENOUS

## 2012-09-01 MED ORDER — PREDNISONE 2.5 MG PO TABS
2.5000 mg | ORAL_TABLET | Freq: Every day | ORAL | Status: DC
  Administered 2012-09-02 – 2012-09-04 (×3): 2.5 mg via ORAL
  Filled 2012-09-01 (×3): qty 1

## 2012-09-01 MED ORDER — ACETAMINOPHEN 650 MG RE SUPP: 650.0000 mg | RECTAL | Status: DC | PRN

## 2012-09-01 MED ORDER — HYDROMORPHONE HCL PF 1 MG/ML IJ SOLN
0.2500 mg | INTRAMUSCULAR | Status: DC | PRN
  Administered 2012-09-01 (×4): 0.5 mg via INTRAVENOUS

## 2012-09-01 MED ORDER — OXYCODONE HCL 5 MG/5ML PO SOLN: 5.0000 mg | Freq: Once | ORAL | Status: AC | PRN

## 2012-09-01 MED ORDER — HYDROMORPHONE 0.3 MG/ML IV SOLN
INTRAVENOUS | Status: AC
  Filled 2012-09-01: qty 25

## 2012-09-01 MED ORDER — HEPARIN SODIUM (PORCINE) 5000 UNIT/ML IJ SOLN
5000.0000 [IU] | Freq: Three times a day (TID) | INTRAMUSCULAR | Status: DC
  Administered 2012-09-01 – 2012-09-04 (×9): 5000 [IU] via SUBCUTANEOUS
  Filled 2012-09-01 (×11): qty 1

## 2012-09-01 MED ORDER — GLYCOPYRROLATE 0.2 MG/ML IJ SOLN
INTRAMUSCULAR | Status: DC | PRN
  Administered 2012-09-01: 0.6 mg via INTRAVENOUS

## 2012-09-01 MED ORDER — ARTIFICIAL TEARS OP OINT
TOPICAL_OINTMENT | OPHTHALMIC | Status: DC | PRN
  Administered 2012-09-01: 1 via OPHTHALMIC

## 2012-09-01 MED ORDER — DIAZEPAM 5 MG PO TABS
5.0000 mg | ORAL_TABLET | Freq: Four times a day (QID) | ORAL | Status: DC | PRN
  Administered 2012-09-01 – 2012-09-04 (×5): 5 mg via ORAL
  Filled 2012-09-01 (×4): qty 1

## 2012-09-01 MED ORDER — NITROGLYCERIN 0.4 MG SL SUBL: 0.4000 mg | SUBLINGUAL_TABLET | SUBLINGUAL | Status: DC | PRN

## 2012-09-01 MED ORDER — MUPIROCIN 2 % EX OINT
TOPICAL_OINTMENT | Freq: Once | CUTANEOUS | Status: AC
  Administered 2012-09-01: 12:00:00 via NASAL

## 2012-09-01 MED ORDER — CEFAZOLIN SODIUM-DEXTROSE 2-3 GM-% IV SOLR
INTRAVENOUS | Status: AC
  Administered 2012-09-01: 2 g via INTRAVENOUS
  Filled 2012-09-01: qty 50

## 2012-09-01 MED ORDER — METOCLOPRAMIDE HCL 5 MG/ML IJ SOLN: 10.0000 mg | Freq: Once | INTRAMUSCULAR | Status: DC | PRN

## 2012-09-01 MED ORDER — SODIUM CHLORIDE 0.9 % IJ SOLN: 3.0000 mL | Freq: Two times a day (BID) | INTRAMUSCULAR | Status: DC

## 2012-09-01 MED ORDER — SODIUM CHLORIDE 0.9 % IJ SOLN: 9.0000 mL | INTRAMUSCULAR | Status: DC | PRN

## 2012-09-01 MED ORDER — SODIUM CHLORIDE 0.9 % IJ SOLN: 3.0000 mL | INTRAMUSCULAR | Status: DC | PRN

## 2012-09-01 MED ORDER — CITALOPRAM HYDROBROMIDE 40 MG PO TABS
40.0000 mg | ORAL_TABLET | Freq: Every day | ORAL | Status: DC
  Administered 2012-09-01 – 2012-09-02 (×2): 40 mg via ORAL
  Filled 2012-09-01 (×3): qty 1

## 2012-09-01 MED ORDER — SODIUM CHLORIDE 0.9 % IV SOLN
INTRAVENOUS | Status: DC
  Administered 2012-09-01 – 2012-09-02 (×3): via INTRAVENOUS

## 2012-09-01 MED ORDER — OXYCODONE-ACETAMINOPHEN 5-325 MG PO TABS
1.0000 | ORAL_TABLET | ORAL | Status: DC | PRN
  Administered 2012-09-01: 2 via ORAL
  Administered 2012-09-02: 1 via ORAL
  Administered 2012-09-03 (×3): 2 via ORAL
  Administered 2012-09-04 (×2): 1 via ORAL
  Administered 2012-09-04 (×2): 2 via ORAL
  Filled 2012-09-01: qty 1
  Filled 2012-09-01: qty 2
  Filled 2012-09-01: qty 1
  Filled 2012-09-01 (×3): qty 2
  Filled 2012-09-01: qty 1
  Filled 2012-09-01 (×3): qty 2

## 2012-09-01 MED ORDER — DIPHENHYDRAMINE HCL 12.5 MG/5ML PO ELIX: 12.5000 mg | ORAL_SOLUTION | Freq: Four times a day (QID) | ORAL | Status: DC | PRN

## 2012-09-01 MED ORDER — THROMBIN 20000 UNITS EX SOLR
CUTANEOUS | Status: DC | PRN
  Administered 2012-09-01: 15:00:00 via TOPICAL

## 2012-09-01 MED ORDER — LIDOCAINE HCL (CARDIAC) 20 MG/ML IV SOLN
INTRAVENOUS | Status: DC | PRN
  Administered 2012-09-01: 100 mg via INTRAVENOUS

## 2012-09-01 MED ORDER — ONDANSETRON HCL 4 MG/2ML IJ SOLN
4.0000 mg | INTRAMUSCULAR | Status: DC | PRN
  Administered 2012-09-02 – 2012-09-03 (×4): 4 mg via INTRAVENOUS
  Filled 2012-09-01 (×4): qty 2

## 2012-09-01 MED ORDER — BUPIVACAINE LIPOSOME 1.3 % IJ SUSP
20.0000 mL | Freq: Once | INTRAMUSCULAR | Status: DC
  Filled 2012-09-01: qty 20

## 2012-09-01 MED ORDER — SUFENTANIL CITRATE 50 MCG/ML IV SOLN
INTRAVENOUS | Status: DC | PRN
  Administered 2012-09-01 (×3): 10 ug via INTRAVENOUS

## 2012-09-01 MED ORDER — DIPHENHYDRAMINE HCL 50 MG/ML IJ SOLN: 12.5000 mg | Freq: Four times a day (QID) | INTRAMUSCULAR | Status: DC | PRN

## 2012-09-01 MED ORDER — ALBUMIN HUMAN 5 % IV SOLN
INTRAVENOUS | Status: DC | PRN
  Administered 2012-09-01 (×2): via INTRAVENOUS

## 2012-09-01 MED ORDER — 0.9 % SODIUM CHLORIDE (POUR BTL) OPTIME
TOPICAL | Status: DC | PRN
  Administered 2012-09-01: 1000 mL

## 2012-09-01 MED ORDER — LACTATED RINGERS IV SOLN
INTRAVENOUS | Status: DC | PRN
  Administered 2012-09-01 (×3): via INTRAVENOUS

## 2012-09-01 MED ORDER — SODIUM CHLORIDE 0.9 % IV SOLN: 250.0000 mL | INTRAVENOUS | Status: DC

## 2012-09-01 MED ORDER — MIDAZOLAM HCL 5 MG/5ML IJ SOLN
INTRAMUSCULAR | Status: DC | PRN
  Administered 2012-09-01: 2 mg via INTRAVENOUS

## 2012-09-01 MED ORDER — MUPIROCIN 2 % EX OINT
TOPICAL_OINTMENT | CUTANEOUS | Status: AC
  Filled 2012-09-01: qty 22

## 2012-09-01 MED ORDER — HYDROMORPHONE HCL PF 1 MG/ML IJ SOLN
INTRAMUSCULAR | Status: AC
  Filled 2012-09-01: qty 1

## 2012-09-01 MED ORDER — OXYCODONE HCL 5 MG PO TABS
5.0000 mg | ORAL_TABLET | Freq: Once | ORAL | Status: AC | PRN
  Administered 2012-09-01: 5 mg via ORAL

## 2012-09-01 SURGICAL SUPPLY — 64 items
BENZOIN TINCTURE PRP APPL 2/3 (GAUZE/BANDAGES/DRESSINGS) ×2 IMPLANT
BLADE SURG ROTATE 9660 (MISCELLANEOUS) IMPLANT
BONE EQUIVA 10CC (Bone Implant) ×2 IMPLANT
BUR ACORN 6.0 (BURR) ×4 IMPLANT
BUR MATCHSTICK NEURO 3.0 LAGG (BURR) ×2 IMPLANT
CANISTER SUCTION 2500CC (MISCELLANEOUS) ×2 IMPLANT
CAP REVERE LOCKING (Cap) ×12 IMPLANT
CLOTH BEACON ORANGE TIMEOUT ST (SAFETY) ×2 IMPLANT
CONT SPEC 4OZ CLIKSEAL STRL BL (MISCELLANEOUS) ×2 IMPLANT
COVER BACK TABLE 24X17X13 BIG (DRAPES) IMPLANT
COVER TABLE BACK 60X90 (DRAPES) ×2 IMPLANT
DRAPE C-ARM 42X72 X-RAY (DRAPES) ×4 IMPLANT
DRAPE LAPAROTOMY 100X72X124 (DRAPES) ×2 IMPLANT
DRAPE POUCH INSTRU U-SHP 10X18 (DRAPES) ×2 IMPLANT
DRSG PAD ABDOMINAL 8X10 ST (GAUZE/BANDAGES/DRESSINGS) IMPLANT
DURAPREP 26ML APPLICATOR (WOUND CARE) ×2 IMPLANT
ELECT REM PT RETURN 9FT ADLT (ELECTROSURGICAL) ×2
ELECTRODE REM PT RTRN 9FT ADLT (ELECTROSURGICAL) ×1 IMPLANT
EVACUATOR 1/8 PVC DRAIN (DRAIN) IMPLANT
EVACUATOR 3/16  PVC DRAIN (DRAIN) ×1
EVACUATOR 3/16 PVC DRAIN (DRAIN) ×1 IMPLANT
GAUZE SPONGE 4X4 16PLY XRAY LF (GAUZE/BANDAGES/DRESSINGS) ×2 IMPLANT
GLOVE BIO SURGEON STRL SZ 6.5 (GLOVE) ×6 IMPLANT
GLOVE BIOGEL M 8.0 STRL (GLOVE) ×4 IMPLANT
GLOVE EXAM NITRILE LRG STRL (GLOVE) IMPLANT
GLOVE EXAM NITRILE MD LF STRL (GLOVE) IMPLANT
GLOVE EXAM NITRILE XL STR (GLOVE) IMPLANT
GLOVE EXAM NITRILE XS STR PU (GLOVE) IMPLANT
GLOVE INDICATOR 6.5 STRL GRN (GLOVE) ×4 IMPLANT
GOWN BRE IMP SLV AUR LG STRL (GOWN DISPOSABLE) ×4 IMPLANT
GOWN BRE IMP SLV AUR XL STRL (GOWN DISPOSABLE) ×4 IMPLANT
GOWN STRL REIN 2XL LVL4 (GOWN DISPOSABLE) IMPLANT
KIT BASIN OR (CUSTOM PROCEDURE TRAY) ×2 IMPLANT
KIT ROOM TURNOVER OR (KITS) ×2 IMPLANT
MILL MEDIUM DISP (BLADE) ×2 IMPLANT
NEEDLE HYPO 18GX1.5 BLUNT FILL (NEEDLE) IMPLANT
NEEDLE HYPO 21X1.5 SAFETY (NEEDLE) IMPLANT
NEEDLE HYPO 25X1 1.5 SAFETY (NEEDLE) IMPLANT
NS IRRIG 1000ML POUR BTL (IV SOLUTION) ×2 IMPLANT
PACK LAMINECTOMY NEURO (CUSTOM PROCEDURE TRAY) ×2 IMPLANT
PAD ARMBOARD 7.5X6 YLW CONV (MISCELLANEOUS) ×6 IMPLANT
PATTIES SURGICAL .5 X1 (DISPOSABLE) ×2 IMPLANT
PATTIES SURGICAL .5 X3 (DISPOSABLE) IMPLANT
ROD REVERE 6.35 CURVED 55MM (Rod) ×4 IMPLANT
SCREW REVERE 5.5X45 (Screw) ×8 IMPLANT
SCREW REVERE 5.5X50 (Screw) ×4 IMPLANT
SPACER SUSTAIN O 10X26 12MM (Spacer) ×4 IMPLANT
SPONGE GAUZE 4X4 12PLY (GAUZE/BANDAGES/DRESSINGS) ×2 IMPLANT
SPONGE LAP 4X18 X RAY DECT (DISPOSABLE) IMPLANT
SPONGE NEURO XRAY DETECT 1X3 (DISPOSABLE) IMPLANT
SPONGE SURGIFOAM ABS GEL 100 (HEMOSTASIS) ×2 IMPLANT
STRIP CLOSURE SKIN 1/2X4 (GAUZE/BANDAGES/DRESSINGS) ×2 IMPLANT
SUT VIC AB 1 CT1 18XBRD ANBCTR (SUTURE) ×2 IMPLANT
SUT VIC AB 1 CT1 8-18 (SUTURE) ×2
SUT VIC AB 2-0 CP2 18 (SUTURE) ×2 IMPLANT
SUT VIC AB 3-0 SH 8-18 (SUTURE) ×2 IMPLANT
SYR 20CC LL (SYRINGE) IMPLANT
SYR 20ML ECCENTRIC (SYRINGE) ×2 IMPLANT
SYR 5ML LL (SYRINGE) IMPLANT
TAPE CLOTH SURG 4X10 WHT LF (GAUZE/BANDAGES/DRESSINGS) ×2 IMPLANT
TOWEL OR 17X24 6PK STRL BLUE (TOWEL DISPOSABLE) ×2 IMPLANT
TOWEL OR 17X26 10 PK STRL BLUE (TOWEL DISPOSABLE) ×2 IMPLANT
TRAY FOLEY CATH 14FRSI W/METER (CATHETERS) ×2 IMPLANT
WATER STERILE IRR 1000ML POUR (IV SOLUTION) ×2 IMPLANT

## 2012-09-01 NOTE — H&P (Signed)
[name] is an 72 y.o. male.   Chief Complaint: lbp HPI: patient who underwent lumbar laminectomies in the past as well as cervical fusion. Now he is with pain from lumbar to both legs , no better with conservative treatment. Myelogram showed a stepoff at l45 with severe ddd at l5s1 and recurrent hnp at l45  Past Medical History  Diagnosis Date  . Hyperlipidemia   . Small bowel problem     HAD ALOT OF SCAR TISSUE FROM PREVIOUS SURGERIES..NG WAS INSERTED ...Marland KitchenMarland KitchenNO SURGERY NEEDED...Marland KitchenMarland KitchenIN FOR 8 DAYS  . Disorder of blood     BEEN TREATED BY DERMATOLOGIST X 4 YRS..."BLOOD BLISTERS"  . Arthritis   . Gout   . Anxiety   . Hx MRSA infection     rt shoulder  . Pneumonia     "I've had it 3-4 times"  . Coronary artery disease     IN 2000   STENT PLACED IN 2012 sees Dr. Dietrich Pates, saw last 2013  . HTN (hypertension)     sees [name] in [place]    Past Surgical History  Procedure Date  . Sternal surg 2000    HAS HAD 5-6 ON HIS STERNUM; "caught MRSA in it"  . Lumbar laminectomy/decompression microdiscectomy 06/13/2011    Procedure: LUMBAR LAMINECTOMY/DECOMPRESSION MICRODISCECTOMY;  Surgeon: Karn Cassis;  Location: [facility];  Service: Neurosurgery;  Laterality: N/A;  Lumbar three, lumbar four-five Laminectomy  . Eye surgery     bilateral cataract  . Anterior cervical corpectomy 12/17/11  . Incision and drainage of wound ~ 20ll; 12/03/11    "had infection in my right"  . Shoulder open rotator cuff repair ~ 2011    right  . Peripherally inserted central catheter insertion 2011 & 11/2011  . Cataract extraction w/ intraocular lens  implant, bilateral ? 2011  . Coronary artery bypass graft 2000    CABG X5  . Back surgery   . Tonsillectomy 1949  . Cholecystectomy 2006 "or after"  . Coronary angioplasty with stent placement 2012  . Anterior cervical corpectomy 12/17/2011    Procedure: ANTERIOR CERVICAL CORPECTOMY;  Surgeon: Karn Cassis, MD;  Location: [facility];   Service: Neurosurgery;  Laterality: N/A;  Anterior Cervical Decompression Fusion Five to Thoracic Two with plating    Family History  Problem Relation Age of Onset  . Heart disease    . Hypertension Mother   . Hypertension Maternal Aunt   . Hypertension Maternal Uncle   . Anesthesia problems Neg Hx   . Hypotension Neg Hx   . Malignant hyperthermia Neg Hx   . Pseudochol deficiency Neg Hx    Social History:  reports that he quit smoking about 13 years ago. His smoking use included Cigarettes. He has a 1 pack-year smoking history. He has never used smokeless tobacco. He reports that he does not drink alcohol or use illicit drugs.  Allergies:  Allergies  Allergen Reactions  . Morphine Other (See Comments)    REACTION: made him go crazy; "I had fun w/it; my family didn't think it was too funny"  . Metoprolol Other (See Comments)    REACTION:  Tachycardia; "don't remember how bad it was; it was so long ago"    Medications Prior to Admission  Medication Sig Dispense Refill  . atorvastatin (LIPITOR) 20 MG tablet Take 20 mg by mouth daily.      . citalopram (CELEXA) 40 MG tablet Take 40 mg by mouth daily.      Marland Kitchen  doxazosin (CARDURA) 8 MG tablet Take 8 mg by mouth at bedtime.       Marland Kitchen lisinopril (PRINIVIL,ZESTRIL) 20 MG tablet Take 20 mg by mouth daily.      Marland Kitchen oxyCODONE (ROXICODONE) 15 MG immediate release tablet Take 15 mg by mouth every 4 (four) hours as needed. For pain      . predniSONE (DELTASONE) 2.5 MG tablet Take 2.5 mg by mouth daily.      . vitamin C (ASCORBIC ACID) 500 MG tablet Take 500 mg by mouth daily.      Marland Kitchen VITAMIN D, CHOLECALCIFEROL, PO Take 2-3 tablets by mouth daily.      Marland Kitchen VITAMIN E PO Take 2-3 tablets by mouth daily.      . nitroGLYCERIN (NITROSTAT) 0.4 MG SL tablet Place 0.4 mg under the tongue every 5 (five) minutes as needed. For chest pain        Results for orders placed during the hospital encounter of 08/31/12 (from the past 48 hour(s))  BASIC METABOLIC PANEL      Status: Abnormal   Collection Time   08/31/12 12:31 PM      Component Value Range Comment   Sodium 134 (*) 135 - 145 mEq/L    Potassium 3.8  3.5 - 5.1 mEq/L    Chloride 97  96 - 112 mEq/L    CO2 28  19 - 32 mEq/L    Glucose, Bld 116 (*) 70 - 99 mg/dL    BUN 15  6 - 23 mg/dL    Creatinine, Ser 1.47  0.50 - 1.35 mg/dL    Calcium 9.9  8.4 - 82.9 mg/dL    GFR calc non Af Amer 54 (*) >90 mL/min    GFR calc Af Amer 63 (*) >90 mL/min   CBC     Status: Abnormal   Collection Time   08/31/12 12:31 PM      Component Value Range Comment   WBC 8.0  4.0 - 10.5 K/uL    RBC 3.81 (*) 4.22 - 5.81 MIL/uL    Hemoglobin 11.4 (*) 13.0 - 17.0 g/dL    HCT 56.2 (*) 13.0 - 52.0 %    MCV 90.0  78.0 - 100.0 fL    MCH 29.9  26.0 - 34.0 pg    MCHC 33.2  30.0 - 36.0 g/dL    RDW 86.5  78.4 - 69.6 %    Platelets 214  150 - 400 K/uL   SURGICAL PCR SCREEN     Status: Abnormal   Collection Time   08/31/12 12:31 PM      Component Value Range Comment   MRSA, PCR NEGATIVE  NEGATIVE    Staphylococcus aureus POSITIVE (*) NEGATIVE   TYPE AND SCREEN     Status: Normal   Collection Time   08/31/12 12:35 PM      Component Value Range Comment   ABO/RH(D) O POS      Antibody Screen NEG      Sample Expiration 09/14/2012      No results found.  Review of Systems  Constitutional: Negative.   HENT: Positive for neck pain.   Eyes: Negative.   Respiratory: Negative.   Cardiovascular: Negative.   Gastrointestinal: Negative.   Genitourinary: Negative.   Musculoskeletal: Positive for back pain.  Skin: Negative.   Neurological: Positive for sensory change and focal weakness.  Endo/Heme/Allergies: Negative.   Psychiatric/Behavioral: Negative.     Blood pressure 96/61, pulse 80, temperature 98.7 F (37.1 C), temperature source  Oral, resp. rate 20, SpO2 97.00%. Physical Exam hent, nl. Neck, anterior scar. Cv, nl. Lungs, clear. Abdomen, soft. Extremities, nl. NEURO weakness of dr and pf . slr positive at 60 degrees.  Sensory changes at l5 s1  Assessment/Plan Decompression and fusion at l45 and l5s1. Patient aware of risks and benefits  [name] 09/01/2012, 1:49 PM

## 2012-09-01 NOTE — Transfer of Care (Signed)
Immediate Anesthesia Transfer of Care Note  Patient: [name]  Procedure(s) Performed: Procedure(s) (LRB) with comments: POSTERIOR LUMBAR FUSION 2 LEVEL (N/A) - Lumbar four-five, Lumbar five-Sacral one Diskectomy/fusion/pedicle screws/posterolateral arthrodesis/cellsaver  Patient Location: PACU  Anesthesia Type:General  Level of Consciousness: awake and alert   Airway & Oxygen Therapy: Patient Spontanous Breathing and Patient connected to face mask oxygen  Post-op Assessment: Report given to PACU RN and Post -op Vital signs reviewed and stable  Post vital signs: Reviewed and stable  Complications: No apparent anesthesia complications

## 2012-09-01 NOTE — Progress Notes (Signed)
SQ heparin ordered immediately following surgery. Dr. Franky Macho called to confirm it was okay to administer. Dr. Franky Macho said it was fine and to go ahead and administer.

## 2012-09-01 NOTE — Anesthesia Preprocedure Evaluation (Signed)
Anesthesia Evaluation  Patient identified by MRN, date of birth, ID band Patient awake    Reviewed: Allergy & Precautions, H&P , NPO status , Patient's Chart, lab work & pertinent test results, reviewed documented beta blocker date and time   Airway Mallampati: II TM Distance: >3 FB Neck ROM: full    Dental   Pulmonary pneumonia -, resolved,  breath sounds clear to auscultation        Cardiovascular hypertension, On Medications + CAD and + Cardiac Stents Rhythm:regular     Neuro/Psych  Neuromuscular disease negative psych ROS   GI/Hepatic Neg liver ROS, GERD-  Medicated and Controlled,  Endo/Other  negative endocrine ROS  Renal/GU negative Renal ROS  negative genitourinary   Musculoskeletal   Abdominal   Peds  Hematology  (+) Blood dyscrasia, ,   Anesthesia Other Findings See surgeon's H&P   Reproductive/Obstetrics negative OB ROS                           Anesthesia Physical Anesthesia Plan  ASA: III  Anesthesia Plan: General   Post-op Pain Management:    Induction: Intravenous  Airway Management Planned: Oral ETT  Additional Equipment:   Intra-op Plan:   Post-operative Plan: Extubation in OR  Informed Consent: I have reviewed the patients History and Physical, chart, labs and discussed the procedure including the risks, benefits and alternatives for the proposed anesthesia with the patient or authorized representative who has indicated his/her understanding and acceptance.   Dental Advisory Given  Plan Discussed with: CRNA and Surgeon  Anesthesia Plan Comments:         Anesthesia Quick Evaluation

## 2012-09-01 NOTE — Progress Notes (Signed)
Op note [phone]

## 2012-09-01 NOTE — Anesthesia Procedure Notes (Addendum)
Performed by: Brien Mates DOBSON   Procedure Name: Intubation Date/Time: 09/01/2012 2:33 PM Performed by: Brien Mates DOBSON Pre-anesthesia Checklist: Patient identified, Emergency Drugs available, Suction available, Patient being monitored and Timeout performed Patient Re-evaluated:Patient Re-evaluated prior to inductionOxygen Delivery Method: Circle system utilized Preoxygenation: Pre-oxygenation with 100% oxygen Intubation Type: IV induction Ventilation: Mask ventilation without difficulty and Oral airway inserted - appropriate to patient size Laryngoscope Size: Hyacinth Meeker and 2 Grade View: Grade I Tube type: Oral Number of attempts: 1 Placement Confirmation: ETT inserted through vocal cords under direct vision,  positive ETCO2 and breath sounds checked- equal and bilateral Secured at: 22 cm Tube secured with: Tape Dental Injury: Teeth and Oropharynx as per pre-operative assessment

## 2012-09-01 NOTE — Anesthesia Postprocedure Evaluation (Signed)
Anesthesia Post Note  Patient: [name]  Procedure(s) Performed: Procedure(s) (LRB): POSTERIOR LUMBAR FUSION 2 LEVEL (N/A)  Anesthesia type: General  Patient location: PACU  Post pain: Pain level controlled and Adequate analgesia  Post assessment: Post-op Vital signs reviewed, Patient's Cardiovascular Status Stable, Respiratory Function Stable, Patent Airway and Pain level controlled  Last Vitals:  Filed Vitals:   09/01/12 1830  BP: 146/70  Pulse: 73  Temp:   Resp: 20    Post vital signs: Reviewed and stable  Level of consciousness: awake, alert  and oriented  Complications: No apparent anesthesia complications

## 2012-09-01 NOTE — Preoperative (Signed)
Beta Blockers   Reason not to administer Beta Blockers:Not Applicable 

## 2012-09-02 DIAGNOSIS — I10 Essential (primary) hypertension: Secondary | ICD-10-CM

## 2012-09-02 MED ORDER — MUPIROCIN 2 % EX OINT
TOPICAL_OINTMENT | Freq: Two times a day (BID) | CUTANEOUS | Status: DC
  Administered 2012-09-02 – 2012-09-04 (×4): via NASAL
  Filled 2012-09-02 (×2): qty 22

## 2012-09-02 MED ORDER — GABAPENTIN 300 MG PO CAPS
300.0000 mg | ORAL_CAPSULE | Freq: Three times a day (TID) | ORAL | Status: DC
  Administered 2012-09-02 – 2012-09-04 (×8): 300 mg via ORAL
  Filled 2012-09-02 (×9): qty 1

## 2012-09-02 MED FILL — Sodium Chloride IV Soln 0.9%: INTRAVENOUS | Qty: 1000 | Status: AC

## 2012-09-02 MED FILL — Heparin Sodium (Porcine) Inj 1000 Unit/ML: INTRAMUSCULAR | Qty: 30 | Status: AC

## 2012-09-02 MED FILL — Sodium Chloride Irrigation Soln 0.9%: Qty: 3000 | Status: AC

## 2012-09-02 NOTE — Progress Notes (Signed)
Orthopedic Tech Progress Note Patient Details:  [name] March 04, 1941 [id number] Brace order completed by Reita Cliche with [employer].  Patient ID: [name], male   DOB: Feb 19, 1941, 72 y.o.   MRN: [id number]   [name] 09/02/2012, 11:09 AM

## 2012-09-02 NOTE — Plan of Care (Signed)
Problem: Consults Goal: Diagnosis - Spinal Surgery Thoraco/Lumbar Spine Fusion     

## 2012-09-02 NOTE — Op Note (Signed)
[name], [name]             ACCOUNT NO.:  [id number]  MEDICAL RECORD NO.:  [id number]  LOCATION:  [id number]                        FACILITY:  [facility]  PHYSICIAN:  Hilda Lias, M.D.   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  History of chronic intake of prednisone.  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  History of chronic intake of prednisone.  PROCEDURE:  Bilateral L4 and L5 facetectomy.  Lysis of adhesions, interbody fusion after extensive diskectomy L4-5 with 2 cages of 12 x 26.  Pedicle screws at L4, L5, S1.  Foraminal decompression of the L4, L5, and S1 nerve root.  Removal of a large synovial cyst.  Pedicle screws.  Posterolateral arthrodesis.  Cell Saver, C-arm.  SURGEON:  Hilda Lias, M.D.  ASSISTANT:  Dr. Franky Macho.  CLINICAL HISTORY:  [name] is a 72 year old gentleman who in the past underwent lumbar laminectomy.  Lately, he fell and he had a fracture of the cervical spine, which was taken care with fusion. Nevertheless, he has a frozen shoulder.  He had been on prednisone for many years.  Lately, he had been complaining of bilateral leg pain.  The patient has failed with conservative treatment.  X-rays showed that he has a thoracolumbar degenerative disk disease but at the level of L4-5, he has a right  spondylolisthesis with a large synovial cyst, and foraminal narrow at L4-5, 5-1.  Surgery was advised in view of no improvement.  He and his wife knew about the risk with the surgery.  PROCEDURE:  The patient was taken to the OR, and after intubation, he was positioned in a prone manner.  The skin was cleaned with Betadine and DuraPrep.  Drapes were applied.  Midline incision following the previous one was made from L3 all the way down to L5-S1.  The patient had quite a bit of adhesion and the difficult part was to be able to visualize the thecal sac.  At the end, we had good visualization of the L4, L5, and S1 nerve root.  We tried to enter the disk space at the level of L5-S1 but it was quite narrow.  We entered the disk space at the level of L4-5.  First diskectomy was done on the left side and then the right side.  At the end, we had a total gross diskectomy _bilaterally_________ to be able to introduce 2 cages of 12 x 26.  The cages were _filled up_________ with autograft and bone extender.  The _cyst_________ which was in the right side was removed.  At the end, we had a good decompression of the thecal sac and the L4 and L5 as well as the S1 nerve root.  Then, with the C- arm first in AP view and then a lateral view, we probed the pedicle of L4, L5, S1.  We introduced 6 screws, 4 of them were 5.5 x 45 and 2 of them were 5.5 x 50.  They were kept in place with a rod and Capps. Then, we went laterally at the level of 3-4, 4-5, 5-1, and we removed the periosteum.  A mix of autograft and bone extensor was used for arthrodesis.  Good hemostasis was achieved but because of the dissection, we left a drain in the epidural space.  From then on, the wound was closed with Vicryl and Steri-Strips.          ______________________________ Hilda Lias, M.D.     EB/MEDQ  D:  09/01/2012  T:  09/02/2012  Job:  [id number]

## 2012-09-02 NOTE — Clinical Social Work Note (Signed)
Clinical Social Work   CSW received consult for SNF. CSW reviewed chart and discussed pt with RN during progression. Awaiting PT/OT evals for discharge recommendations. CSW will assess for SNF, if appropriate. Please call with any urgent needs. CSW will continue to follow.   Faithlynn Deeley, MSW, LCSWA #[phone] 

## 2012-09-02 NOTE — Clinical Social Work Note (Signed)
Clinical Social Work   CSW reviewed chart. PT is not recommending any follow up. CSW will update RNCM. CSW is signing off at this time. Please reconsult if a need arises prior to discharge.   Dede Query, MSW, Theresia Majors [phone]

## 2012-09-02 NOTE — Evaluation (Signed)
Physical Therapy Evaluation Patient Details Name: [name] MRN: [id number] DOB: July 03, 1941 Today's Date: 09/02/2012 Time: 4098-1191 PT Time Calculation (min): 37 min  PT Assessment / Plan / Recommendation Clinical Impression  Pt. admitted because of back and leg pain from T-L DDD, L4-5 spondylolesthesis and cyst.  He underwent bilateral L4-L5 facetectomy, PLIF, and cyst removal.  He presents to PT in pain and with a decrease in his usual level of mobility and gait.lHe will benefit from PT to address these and below issues before DC home with wife.    PT Assessment  Patient needs continued PT services    Follow Up Recommendations  No PT follow up;Supervision/Assistance - 24 hour;Supervision for mobility/OOB    Does the patient have the potential to tolerate intense rehabilitation      Barriers to Discharge None      Equipment Recommendations  Rolling walker with 5" wheels    Recommendations for Other Services     Frequency Min 5X/week    Precautions / Restrictions Precautions Precautions: Back Precaution Booklet Issued: Yes (comment) Precaution Comments: instructed in back precautions and log rolling technique Required Braces or Orthoses: Spinal Brace Spinal Brace: Applied in sitting position;Other (comment) ([employer] delivered at end of PT/OT session) Spinal Brace Comments: pt. and RN [name] report Dr. Jeral [name] in earlier this am and OK for pt. to get OOB/ambulate without brace until it arrives Restrictions Weight Bearing Restrictions: No   Pertinent Vitals/Pain See vitals tab      Mobility  Bed Mobility Bed Mobility: Not assessed (pt. presented in recliner chair) Transfers Transfers: Sit to Stand;Stand to Sit Sit to Stand: 4: Min guard;From chair/3-in-1;With armrests Stand to Sit: 4: Min guard;To chair/3-in-1;With armrests Details for Transfer Assistance: Pt. neededcueing for appropriate hand placement and technique but needed only min guard assist fot  stability Ambulation/Gait Ambulation/Gait Assistance: 4: Min guard Ambulation Distance (Feet): 150 Feet Assistive device: Rolling walker Ambulation/Gait Assistance Details: Pt. needed vc's for erect posture as he tends to flex trunk slightly in walking.  Min guard assis needed for safety and to ensure stability. Gait Pattern: Step-through pattern;Trunk flexed Stairs: No    Exercises     PT Diagnosis: Difficulty walking;Abnormality of gait;Acute pain  PT Problem List:   PT Treatment Interventions: DME instruction;Gait training;Functional mobility training;Therapeutic activities;Patient/family education   PT Goals Acute Rehab PT Goals PT Goal Formulation: With patient Time For Goal Achievement: 09/09/12 Potential to Achieve Goals: Good Pt will Roll Supine to Right Side: with modified independence PT Goal: Rolling Supine to Right Side - Progress: Goal set today Pt will Roll Supine to Left Side: with modified independence PT Goal: Rolling Supine to Left Side - Progress: Goal set today Pt will go Supine/Side to Sit: with modified independence PT Goal: Supine/Side to Sit - Progress: Goal set today Pt will go Sit to Supine/Side: with modified independence PT Goal: Sit to Supine/Side - Progress: Goal set today Pt will go Sit to Stand: with modified independence PT Goal: Sit to Stand - Progress: Goal set today Pt will go Stand to Sit: with modified independence PT Goal: Stand to Sit - Progress: Goal set today Pt will Transfer Bed to Chair/Chair to Bed: with modified independence PT Transfer Goal: Bed to Chair/Chair to Bed - Progress: Goal set today Pt will Ambulate: >150 feet;with modified independence;with least restrictive assistive device PT Goal: Ambulate - Progress: Goal set today Additional Goals Additional Goal #1: Pt. will state and comply with 3/3 back precautions and log rolling technique PT  Goal: Additional Goal #1 - Progress: Goal set today  Visit Information  Last PT  Received On: 09/02/12 Assistance Needed: +1 PT/OT Co-Evaluation/Treatment: Yes    Subjective Data  Subjective: Able to state 2/3 back precautions Patient Stated Goal: resume caring for self   Prior Functioning  Home Living Lives With: Spouse Available Help at Discharge: Family;Available 24 hours/day Type of Home: House Home Access: Level entry Home Layout: One level Bathroom Shower/Tub: Walk-in shower;Door [employer] Toilet: Standard Bathroom Accessibility: Yes How Accessible: Accessible via walker Home Adaptive Equipment: Bedside commode/3-in-1;Straight cane;Walker - rolling;Other (comment) (pat. unsure if RW vs standard) Prior Function Level of Independence: Independent Able to Take Stairs?: Yes Driving: Yes Vocation: Retired Musician: No difficulties Dominant Hand: Left    Cognition  Cognition Overall Cognitive Status: Appears within functional limits for tasks assessed/performed Arousal/Alertness: Awake/alert Orientation Level: Oriented X4 / Intact Behavior During Session: WFL for tasks performed Cognition - Other Comments: despite pain, pt. frequently kidding and making jokes    Extremity/Trunk Assessment Right Upper Extremity Assessment RUE ROM/Strength/Tone: Deficits RUE ROM/Strength/Tone Deficits: decreased strength and ROM at shoulder due to old injury, grossly WFL distally Left Upper Extremity Assessment LUE ROM/Strength/Tone: WFL for tasks assessed Right Lower Extremity Assessment RLE ROM/Strength/Tone: WFL for tasks assessed RLE Sensation: WFL - Light Touch Left Lower Extremity Assessment LLE ROM/Strength/Tone: WFL for tasks assessed LLE Sensation: WFL - Light Touch Trunk Assessment Trunk Assessment: Normal   Balance    End of Session PT - End of Session Equipment Utilized During Treatment: Gait belt Activity Tolerance: Patient tolerated treatment well Patient left: in chair;with call bell/phone within reach;Other (comment) ([employer]  rep in room with pt.) Nurse Communication: Mobility status  GP     [name] 09/02/2012, 11:10 AM [name] PT Acute Rehab Services [phone] Beeper [phone]

## 2012-09-02 NOTE — Progress Notes (Signed)
At 1340, patient bladder scanned, greater than 450cc of urine  was seen, emptied 350 with Straight catheter. Have been encourage to drink a lot of fluids.will continue to monitor.

## 2012-09-02 NOTE — Evaluation (Signed)
Occupational Therapy Evaluation Patient Details Name: [name] MRN: [id number] DOB: 1941/05/24 Today's Date: 09/02/2012 Time: 1010-1044 OT Time Calculation (min): 34 min  OT Assessment / Plan / Recommendation Clinical Impression  Pt is recovering from bilateral L4 and L5 facetectomy interbody fusion after extensive diskectomy L4-5, decompression of the L4, L5, and S1 nerve root and removal of a large synovial cyst. He is motivated to mobilize and perform ADL independently.  Doing well POD 1.  Will follow acutely.    OT Assessment  Patient needs continued OT Services    Follow Up Recommendations  No OT follow up;Supervision/Assistance - 24 hour    Barriers to Discharge      Equipment Recommendations  None recommended by OT    Recommendations for Other Services    Frequency  Min 3X/week    Precautions / Restrictions Precautions Precautions: Back;Fall Precaution Booklet Issued: Yes (comment) Precaution Comments: instructed in back precautions and log rolling technique Required Braces or Orthoses: Spinal Brace Spinal Brace: Applied in sitting position Spinal Brace Comments: pt. and RN Riley Lam report Dr. Jeral Fruit in earlier this am and OK for pt. to get OOB/ambulate without brace until it arrives Restrictions Weight Bearing Restrictions: No   Pertinent Vitals/Pain L LE pain- did not rate, using PCA, repositioned    ADL  Eating/Feeding: Independent Where Assessed - Eating/Feeding: Chair Grooming: Set up;Wash/dry face Where Assessed - Grooming: Supported sitting Upper Body Bathing: Minimal assistance Where Assessed - Upper Body Bathing: Unsupported sitting Lower Body Bathing: +1 Total assistance Where Assessed - Lower Body Bathing: Unsupported sitting;Supported sit to stand Upper Body Dressing: Set up Where Assessed - Upper Body Dressing: Unsupported sitting Lower Body Dressing: +1 Total assistance Where Assessed - Lower Body Dressing: Unsupported sitting;Supported sit to  stand Equipment Used: Gait belt;Rolling walker (back brace arrived at the end of the session) Transfers/Ambulation Related to ADLs: Pt requiring min guard assist and verbal cues for technique with RW. ADL Comments: Pt would like to learn to use AE for LB ADL, does not want to rely on his wife.    OT Diagnosis: Generalized weakness;Acute pain  OT Problem List: Decreased strength;Decreased activity tolerance;Impaired balance (sitting and/or standing);Decreased knowledge of use of DME or AE;Decreased knowledge of precautions;Pain;Impaired UE functional use OT Treatment Interventions: Self-care/ADL training;DME and/or AE instruction;Patient/family education;Therapeutic activities   OT Goals Acute Rehab OT Goals OT Goal Formulation: With patient Time For Goal Achievement: 09/09/12 Potential to Achieve Goals: Good ADL Goals Pt Will Perform Grooming: with supervision;Standing at sink ADL Goal: Grooming - Progress: Goal set today Pt Will Perform Lower Body Bathing: with supervision;Sitting, chair;Sit to stand from chair;with adaptive equipment ADL Goal: Lower Body Bathing - Progress: Goal set today Pt Will Perform Lower Body Dressing: with supervision;Sit to stand from chair;with adaptive equipment ADL Goal: Lower Body Dressing - Progress: Goal set today Pt Will Transfer to Toilet: with supervision;Ambulation;3-in-1;Maintaining back safety precautions (3 in 1 over toilet) ADL Goal: Toilet Transfer - Progress: Goal set today Pt Will Perform Toileting - Clothing Manipulation: with supervision;Standing ADL Goal: Toileting - Clothing Manipulation - Progress: Goal set today Pt Will Perform Toileting - Hygiene: with modified independence;Sit to stand from 3-in-1/toilet ADL Goal: Toileting - Hygiene - Progress: Goal set today Pt Will Perform Tub/Shower Transfer: Shower transfer;with supervision;Ambulation;Maintaining back safety precautions (onto 3 in 1) ADL Goal: Tub/Shower Transfer - Progress: Goal  set today Miscellaneous OT Goals Miscellaneous OT Goal #1: Pt will state 3/3 back precautions and generalize them in ADL with min verbal  cues. OT Goal: Miscellaneous Goal #1 - Progress: Goal set today  Visit Information  Last OT Received On: 09/02/12 Assistance Needed: +1 PT/OT Co-Evaluation/Treatment: Yes    Subjective Data  Subjective: "I want to walk more." Patient Stated Goal: Home with wife.  Maximize independence.   Prior Functioning     Home Living Lives With: Spouse Available Help at Discharge: Family;Available 24 hours/day Type of Home: House Home Access: Level entry Home Layout: One level Bathroom Shower/Tub: Walk-in shower;Door [employer] Toilet: Standard Bathroom Accessibility: Yes How Accessible: Accessible via walker Home Adaptive Equipment: Bedside commode/3-in-1;Straight cane;Walker - rolling;Other (comment) Prior Function Level of Independence: Independent Able to Take Stairs?: Yes Driving: Yes Vocation: Retired Musician: No difficulties Dominant Hand: Left         Vision/Perception     Copywriter, advertising Overall Cognitive Status: Appears within functional limits for tasks assessed/performed Arousal/Alertness: Awake/alert Orientation Level: Oriented X4 / Intact Behavior During Session: WFL for tasks performed Cognition - Other Comments: despite pain, pt. frequently kidding and making jokes    Extremity/Trunk Assessment Right Upper Extremity Assessment RUE ROM/Strength/Tone: Deficits RUE ROM/Strength/Tone Deficits: decreased strength and ROM at shoulder due to old injury, grossly WFL distally RUE Coordination: WFL - fine motor Left Upper Extremity Assessment LUE ROM/Strength/Tone: WFL for tasks assessed LUE Coordination: WFL - gross/fine motor  Trunk Assessment Trunk Assessment: Normal     Mobility Bed Mobility Bed Mobility: Not assessed Transfers Sit to Stand: 4: Min guard;From chair/3-in-1;With armrests Stand to  Sit: 4: Min guard;To chair/3-in-1;With armrests Details for Transfer Assistance: Pt. neededcueing for appropriate hand placement and technique but needed only min guard assist fot stability     Exercise     Balance     End of Session OT - End of Session Equipment Utilized During Treatment: Gait belt Activity Tolerance: Patient limited by fatigue Patient left: in chair;with call bell/phone within reach;with family/visitor present Nurse Communication: Mobility status  GO     Evern Bio 09/02/2012, 12:12 PM [phone]

## 2012-09-02 NOTE — Progress Notes (Signed)
Patient ID: [name], male   DOB: Jan 01, 1941, 72 y.o.   MRN: [id number] hemovac working well. C/o pain in left led but no weakness. Sensory normal

## 2012-09-02 NOTE — Progress Notes (Signed)
UR COMPLETED  

## 2012-09-02 NOTE — Progress Notes (Signed)
Foley removed at 0515 per order. Fluids encouraged.

## 2012-09-03 MED ORDER — CITALOPRAM HYDROBROMIDE 20 MG PO TABS
20.0000 mg | ORAL_TABLET | Freq: Every day | ORAL | Status: DC
  Administered 2012-09-04: 20 mg via ORAL
  Filled 2012-09-03: qty 1

## 2012-09-03 MED ORDER — FLEET ENEMA 7-19 GM/118ML RE ENEM: 1.0000 | ENEMA | Freq: Every day | RECTAL | Status: DC | PRN

## 2012-09-03 MED ORDER — HYDROMORPHONE HCL 2 MG PO TABS
4.0000 mg | ORAL_TABLET | ORAL | Status: DC | PRN
  Administered 2012-09-03 – 2012-09-04 (×4): 4 mg via ORAL
  Filled 2012-09-03 (×4): qty 2

## 2012-09-03 NOTE — Progress Notes (Signed)
Patient able to move bowels this evening so enema not needed at this time.

## 2012-09-03 NOTE — Progress Notes (Signed)
Occupational Therapy Treatment Patient Details Name: [name] MRN: [id number] DOB: [birth year] Today's Date: 09/03/2012 Time: 4098-1191 OT Time Calculation (min): 38 min  OT Assessment / Plan / Recommendation Comments on Treatment Session Pt still in a lot pain, unable to tolerate sitting up in chair more than 5 mins secondary to increased pain in his back and his left leg.  Educated on back precautions and use of AE however pt very restless this morning secondary to pain.  Will continue to follow.  Still min assist level for transfers and functional mobility.    Follow Up Recommendations  Supervision/Assistance - 24 hour       Equipment Recommendations  None recommended by OT       Frequency Min 3X/week   Plan Discharge plan remains appropriate    Precautions / Restrictions Precautions Precautions: Fall;Back Spinal Brace: Lumbar corset;Applied in sitting position Restrictions Weight Bearing Restrictions: No   Pertinent Vitals/Pain O2 sats 90 % on room air, HR 110    ADL  Upper Body Dressing: Minimal assistance;Other (comment) (for lumbar corset sitting EOB) Where Assessed - Upper Body Dressing: Unsupported sitting Lower Body Dressing: Performed;Minimal assistance;Other (comment) (using reacher and sockaide) Where Assessed - Lower Body Dressing: Supported sit to stand Toilet Transfer: Mining engineer Method: Other (comment) (ambulate with RW) Toilet Transfer Equipment: Bedside commode;Other (comment) (simulated) Equipment Used: Back brace;Rolling walker;Sock aid;Reacher Transfers/Ambulation Related to ADLs: Pt performed mobility with use of the RW, min assist, and mod instructional cueing for hand placement with all sit to stand transitions ADL Comments: Pt practiced using AE for donning and doffing socks.  Does exhibit some increased ataxia in his UEs and hands.  He states this is normal for him.  Unable to tolerate sitting up in bedside chair  at end of session secondary to back pain.       OT Goals ADL Goals ADL Goal: Lower Body Dressing - Progress: Progressing toward goals ADL Goal: Toilet Transfer - Progress: Progressing toward goals Miscellaneous OT Goals OT Goal: Miscellaneous Goal #1 - Progress: Progressing toward goals  Visit Information  Last OT Received On: 09/03/12 Assistance Needed: +1    Subjective Data  Subjective: My left leg is hurting. Patient Stated Goal: Pt requested to go back to bed unable to tolerate sitting up in the chair.      Cognition  Cognition Overall Cognitive Status: Impaired Area of Impairment: Memory Arousal/Alertness: Awake/alert Orientation Level: Oriented X4 / Intact Behavior During Session: Restless Memory: Decreased recall of precautions Memory Deficits: Pt able to state 1/3 precautions but was aware that he had a handout with them on it for reference.  Slight "fidgety" this morning and difficulty concentrating secondary to pain.  Likely due to medications and pain.    Mobility  Bed Mobility Bed Mobility: Rolling Left;Left Sidelying to Sit Rolling Left: 5: Supervision;With rail Left Sidelying to Sit: With rails;HOB flat;5: Supervision Transfers Transfers: Sit to Stand Sit to Stand: With upper extremity assist;From bed Stand to Sit: With upper extremity assist;With armrests Details for Transfer Assistance: Pt needed mod instructional cueing for hand placement during sit to stand.       Balance Balance Balance Assessed: Yes Dynamic Standing Balance Dynamic Standing - Balance Support: Right upper extremity supported;Left upper extremity supported Dynamic Standing - Level of Assistance: 4: Min assist;Other (comment) (with use of RW)   End of Session OT - End of Session Equipment Utilized During Treatment: Back brace Activity Tolerance: Patient tolerated treatment well Patient left: in  bed;with call bell/phone within reach;with nursing in room Nurse Communication: Mobility  status     Oluwatimileyin Vivier OTR/L Pager number [phone] 09/03/2012, 10:14 AM

## 2012-09-03 NOTE — Progress Notes (Signed)
Patient ID: [name], male   DOB: 11-05-1940, 72 y.o.   MRN: [id number] Stable decrease of leg pain, no weakness. Plan to remove drain and shower. Came from home taken 40 mgs of celexa as ordered by hid MD. WILL DROP TO 20

## 2012-09-03 NOTE — Progress Notes (Signed)
Physical Therapy Treatment Patient Details Name: [name] MRN: [id number] DOB: 1940-10-12 Today's Date: 09/03/2012 Time: 8295-6213 PT Time Calculation (min): 28 min  PT Assessment / Plan / Recommendation Comments on Treatment Session  Pt very unsteady during ambulation today and noted increased confusion and fatigue.  Ambulated on RA and noted pt very SOB upon returning to room, therefore checked O2 sats with pulseox reading 79%.  RN in room and placed back on 3L O2 with SaO2 increasing to 90% at end of session.  Feel that pt will need HHPT at D/C to maximize pts safety and function.l     Follow Up Recommendations  Home health PT;Supervision/Assistance - 24 hour     Does the patient have the potential to tolerate intense rehabilitation     Barriers to Discharge        Equipment Recommendations  Rolling walker with 5" wheels    Recommendations for Other Services    Frequency Min 5X/week   Plan Discharge plan needs to be updated    Precautions / Restrictions Precautions Precautions: Fall;Back Precaution Booklet Issued: Yes (comment) Precaution Comments: continue to provide MAX education on back precautions, however pt with very poor recall during session.  Required Braces or Orthoses: Spinal Brace Spinal Brace: Lumbar corset;Applied in sitting position Spinal Brace Comments: Pt unable to don brace himself and requires assist.  Restrictions Weight Bearing Restrictions: No   Pertinent Vitals/Pain Increased pain in LLE with spasms/cramping.  RN aware and has premedicated.     Mobility  Bed Mobility Bed Mobility: Rolling Left;Left Sidelying to Sit;Sit to Sidelying Left Rolling Left: 5: Supervision;With rail Left Sidelying to Sit: With rails;HOB flat;5: Supervision Sit to Sidelying Left: 3: Mod assist;HOB flat Details for Bed Mobility Assistance: Requires cues for no twisting, esp when getting back into bed.  Requires assist for BLEs into bed due to increased pain and  fatigue.  Max cues for log rolling technique and hand placement for safety/technique.  Transfers Transfers: Sit to Stand;Stand to Sit Sit to Stand: 4: Min assist;4: Min guard;With upper extremity assist;From bed;With armrests;From chair/3-in-1 Stand to Sit: 4: Min assist;With upper extremity assist;With armrests;To chair/3-in-1;To bed Details for Transfer Assistance: Requires mod to max cues for hand placement and safety when sitting/standing.  Performed x 3 in order to take rest break when ambulating and for better positioning in bed.  Also cues for maintaining back precautions.  Ambulation/Gait Ambulation/Gait Assistance: 4: Min assist Ambulation Distance (Feet): 60 Feet (x2) Assistive device: Rolling walker Ambulation/Gait Assistance Details: Pt demos very unsteady gait pattern during PT session.  Noted increased weakness in LLE with pt c/o increased pain in LLE.  Max cues for slower gait speed for safety, maintaining position inside of RW and to maintain upright posture throughout.  Noted pt to be very shakey during session, which he at first stated was not normal, then later stated he did PTA.  Gait Pattern: Step-through pattern;Trunk flexed General Gait Details: very antalgic gait pattern with noted LLE weakness.  Stairs: No    Exercises     PT Diagnosis:    PT Problem List:   PT Treatment Interventions:     PT Goals Acute Rehab PT Goals PT Goal Formulation: With patient Time For Goal Achievement: 09/09/12 Potential to Achieve Goals: Good Pt will Roll Supine to Left Side: with modified independence PT Goal: Rolling Supine to Left Side - Progress: Progressing toward goal Pt will go Supine/Side to Sit: with modified independence PT Goal: Supine/Side to Sit - Progress:  Progressing toward goal Pt will go Sit to Supine/Side: with modified independence PT Goal: Sit to Supine/Side - Progress: Progressing toward goal Pt will go Sit to Stand: with modified independence PT Goal: Sit to  Stand - Progress: Progressing toward goal Pt will go Stand to Sit: with modified independence PT Goal: Stand to Sit - Progress: Progressing toward goal Pt will Transfer Bed to Chair/Chair to Bed: with modified independence PT Transfer Goal: Bed to Chair/Chair to Bed - Progress: Progressing toward goal Pt will Ambulate: >150 feet;with modified independence;with least restrictive assistive device PT Goal: Ambulate - Progress: Progressing toward goal Additional Goals Additional Goal #1: Pt. will state and comply with 3/3 back precautions and log rolling technique PT Goal: Additional Goal #1 - Progress: Not progressing  Visit Information  Last PT Received On: 09/03/12 Assistance Needed: +1    Subjective Data  Subjective: I can't dance (in response to being asked about back precautions) Patient Stated Goal: resume caring for self   Cognition  Cognition Overall Cognitive Status: Impaired Area of Impairment: Memory;Following commands;Safety/judgement Arousal/Alertness: Awake/alert Orientation Level: Oriented X4 / Intact Behavior During Session: Restless Memory: Decreased recall of precautions Memory Deficits: Pt unable to state any precautions during PT session.  Re-educated pt on all three and asked pt to return understanding, however he could only recall no twisting.   Following Commands: Follows one step commands inconsistently Safety/Judgement: Impulsive;Decreased awareness of safety precautions;Decreased safety judgement for tasks assessed Safety/Judgement - Other Comments: Pt demonstrating some twisting in bed, not using proper hand placement when sitting/standing and did not take RW with him in restroom, requiring MAX cues for safety.     Balance  Balance Balance Assessed: Yes Static Standing Balance Static Standing - Balance Support: No upper extremity supported Static Standing - Level of Assistance: 4: Min assist Static Standing - Comment/# of Minutes: Had pt stand to ensure  proper placement of brace with pt demonstrating posterior leaning and requires assist to keep from falling back to bed.  Dynamic Standing Balance Dynamic Standing - Balance Support: Right upper extremity supported;Left upper extremity supported Dynamic Standing - Level of Assistance: 4: Min assist;Other (comment) (with use of RW)  End of Session PT - End of Session Equipment Utilized During Treatment: Back brace Activity Tolerance: Patient limited by fatigue;Patient limited by pain Patient left: in bed;with call bell/phone within reach;with nursing in room Nurse Communication: Mobility status   GP     Vista Deck 09/03/2012, 11:22 AM

## 2012-09-03 NOTE — Progress Notes (Signed)
Patient has been urinating well this evening. Approx each time. No new I/O catheterizations needed tonight. Will continue to monitor.

## 2012-09-04 MED ORDER — OXYCODONE HCL 15 MG PO TABS: 15.0000 mg | ORAL_TABLET | ORAL | Status: DC | PRN

## 2012-09-04 NOTE — Progress Notes (Signed)
Occupational Therapy Treatment Patient Details Name: EDMOND GINSBERG MRN: [id number] DOB: 1941-03-07 Today's Date: 09/04/2012 Time: 0865-7846 OT Time Calculation (min): 14 min  OT Assessment / Plan / Recommendation Comments on Treatment Session      Follow Up Recommendations  Supervision/Assistance - 24 hour    Barriers to Discharge       Equipment Recommendations       Recommendations for Other Services    Frequency Min 3X/week   Plan Discharge plan remains appropriate    Precautions / Restrictions Precautions Precautions: Fall;Back Precaution Comments: able to describe precautions "what not to do" but not using the words bend, arch, and twist.  Required Braces or Orthoses: Spinal Brace Spinal Brace: Lumbar corset;Applied in sitting position Spinal Brace Comments: still requiring assistance to don brace Restrictions Weight Bearing Restrictions: No   Pertinent Vitals/Pain 2/10    ADL  Grooming: Performed;Wash/dry hands;Supervision/safety Where Assessed - Grooming: Supported Copywriter, advertising: Research scientist (life sciences) Method: Other (comment) (amb. with rolling walker) Acupuncturist: Regular height toilet Toileting - Clothing Manipulation and Hygiene: Performed;Supervision/safety Where Assessed - Toileting Clothing Manipulation and Hygiene: Standing Equipment Used: Back brace;Rolling walker Transfers/Ambulation Related to ADLs: pt. amb. in room to b.room with rolling walker. cues to stay inside of the walker during ambulation and while standing for tasks    OT Diagnosis:    OT Problem List:   OT Treatment Interventions:     OT Goals ADL Goals ADL Goal: Grooming - Progress: Progressing toward goals ADL Goal: Toilet Transfer - Progress: Progressing toward goals ADL Goal: Toileting - Clothing Manipulation - Progress: Progressing toward goals ADL Goal: Toileting - Hygiene - Progress: Progressing toward goals Miscellaneous OT  Goals OT Goal: Miscellaneous Goal #1 - Progress: Progressing toward goals  Visit Information  Last OT Received On: 09/04/12    Subjective Data  Subjective: "i know what i'm doing i was just seein if yall do"   Prior Functioning       Cognition  Cognition Overall Cognitive Status: Impaired Area of Impairment: Memory (makes  a lot of jokes as possible way of compensation?) Arousal/Alertness: Awake/alert Orientation Level: Oriented X4 / Intact Behavior During Session: [facility] for tasks performed Memory: Decreased recall of precautions Memory Deficits: describes some of the precautions but not able to state the words bending, arching, twisting. changing the subject a lot and joking with max attempts to review and demonstrate the precautions Following Commands: Follows one step commands inconsistently Safety/Judgement: Impulsive;Decreased safety judgement for tasks assessed    Mobility  Transfers Transfers: Sit to Stand;Stand to Sit Sit to Stand: 5: Supervision;With upper extremity assist;With armrests (cues for tech. and hand placement) Stand to Sit: 5: Supervision;With upper extremity assist;With armrests Details for Transfer Assistance: cues for tech. and hand placement before sitting down    Exercises      Balance     End of Session OT - End of Session Equipment Utilized During Treatment: Back brace Activity Tolerance: Patient tolerated treatment well Patient left: in chair;with call bell/phone within reach  GO     Robet Leu 09/04/2012, 10:16 AM

## 2012-09-04 NOTE — Progress Notes (Signed)
Physical Therapy Treatment Patient Details Name: [name] MRN: [id number] DOB: 1941/06/12 Today's Date: 09/04/2012 Time: 7829-5621 PT Time Calculation (min): 14 min  PT Assessment / Plan / Recommendation Comments on Treatment Session  Progressing with mobility + PT goals at this date--  improved stability during ambulation today as evident in only requiring Min Guard & ambulated ~150' with RW.      Follow Up Recommendations  Home health PT;Supervision/Assistance - 24 hour     Does the patient have the potential to tolerate intense rehabilitation     Barriers to Discharge        Equipment Recommendations  Rolling walker with 5" wheels    Recommendations for Other Services    Frequency Min 5X/week   Plan      Precautions / Restrictions Precautions Precautions: Fall;Back Precaution Comments: able to describe precautions "what not to do" but not using the words bend, arch, and twist.  Required Braces or Orthoses: Spinal Brace Spinal Brace: Lumbar corset;Applied in sitting position Spinal Brace Comments: still requiring assistance to don brace Restrictions Weight Bearing Restrictions: No       Mobility  Bed Mobility Bed Mobility: Not assessed Details for Bed Mobility Assistance: Pt sitting on EOB upon arrival.   Transfers Transfers: Sit to Stand;Stand to Sit Sit to Stand: 5: Supervision;With upper extremity assist;With armrests Stand to Sit: 5: Supervision;With upper extremity assist;With armrests Details for Transfer Assistance: cues for tech. and hand placement before sitting down Ambulation/Gait Ambulation/Gait Assistance: 4: Min guard Ambulation Distance (Feet): 150 Feet Assistive device: Rolling walker Ambulation/Gait Assistance Details: Cues for body positioning inside RW & tall posture Gait Pattern: Step-through pattern;Decreased stride length General Gait Details: Antalgic gait + LLE weakness per pt Stairs: No Wheelchair Mobility Wheelchair Mobility: No       PT Goals Acute Rehab PT Goals Time For Goal Achievement: 09/09/12 Potential to Achieve Goals: Good Pt will Roll Supine to Right Side: with modified independence Pt will Roll Supine to Left Side: with modified independence Pt will go Supine/Side to Sit: with modified independence Pt will go Sit to Supine/Side: with modified independence Pt will go Sit to Stand: with modified independence PT Goal: Sit to Stand - Progress: Progressing toward goal Pt will go Stand to Sit: with modified independence PT Goal: Stand to Sit - Progress: Progressing toward goal Pt will Transfer Bed to Chair/Chair to Bed: with modified independence Pt will Ambulate: >150 feet;with modified independence;with least restrictive assistive device PT Goal: Ambulate - Progress: Progressing toward goal Additional Goals Additional Goal #1: Pt. will state and comply with 3/3 back precautions and log rolling technique PT Goal: Additional Goal #1 - Progress: Progressing toward goal  Visit Information  Last PT Received On: 09/04/12 Assistance Needed: +1    Subjective Data      Cognition  Cognition Overall Cognitive Status: Impaired Area of Impairment: Memory;Other (comment) (makes a lot of jokes as possible way of compensation?) Arousal/Alertness: Awake/alert Orientation Level: Oriented X4 / Intact Behavior During Session: [facility] for tasks performed Memory: Decreased recall of precautions Memory Deficits: describes some of the precautions but not able to state the words bending, arching, twisting. changing the subject a lot and joking with max attempts to review and demonstrate the precautions Following Commands: Follows one step commands inconsistently Safety/Judgement: Impulsive;Decreased safety judgement for tasks assessed    Balance     End of Session PT - End of Session Equipment Utilized During Treatment: Back brace Activity Tolerance: Patient tolerated treatment well Patient left:  in chair;with call  bell/phone within reach Nurse Communication: Mobility status     Verdell Face, [place] [phone] 09/04/2012

## 2012-09-04 NOTE — Discharge Summary (Signed)
Physician Discharge Summary  Patient ID: [name] MRN: [id number] DOB/AGE: [birth year] 70 y.o.  Admit date: 09/01/2012 Discharge date: 09/04/2012  Admission Diagnoses:  Discharge Diagnoses:  Active Problems:   * No active hospital problems. *   Discharged Condition: good  Hospital Course: Surgery for stenosis and synovial cyst. Did well. Increased activity well. Wound healed well. Only issues at d/c were some weakness of left dorsiflexion which was reportedly old, and some numbness of ulnar aspect of left arm. Patient preferred that these be followed as out patient so he could recover further at home. Specific instructions given.  Consults: None  Significant Diagnostic Studies: none  Treatments: surgery: L 45 plif, L5 S1 fusion  Discharge Exam: Blood pressure 107/40, pulse 76, temperature 97.9 F (36.6 C), temperature source Oral, resp. rate 18, height 6' (1.829 m), weight 88.7 kg (195 lb 8.8 oz), SpO2 96.00%. Incision/Wound:clean and dry  Disposition: 01-Home or Self Care  Discharge Orders   Future Orders Complete By Expires     Call MD for:  difficulty breathing, headache or visual disturbances  As directed     Call MD for:  hives  As directed     Call MD for:  persistant nausea and vomiting  As directed     Call MD for:  redness, tenderness, or signs of infection (pain, swelling, redness, odor or green/yellow discharge around incision site)  As directed     Call MD for:  severe uncontrolled pain  As directed     Call MD for:  temperature >100.4  As directed     Diet general  As directed     Discharge instructions  As directed     Comments:      Mostly bedrest. Get up 9 or 10 times each day and walk for 15-20 minutes each time. Very little sitting the first week. No riding in the car until your first post op appointment. If you had neck surgery...may shower from the chest down. If you had low back surgery....you may shower with a saran wrap covering over the incision.  Take your pain medicine as needed...and other medicines that you are instructed to take. Call for an appointment...[phone].        Medication List    TAKE these medications       atorvastatin 20 MG tablet  Commonly known as:  LIPITOR  Take 20 mg by mouth daily.     citalopram 40 MG tablet  Commonly known as:  CELEXA  Take 40 mg by mouth daily.     doxazosin 8 MG tablet  Commonly known as:  CARDURA  Take 8 mg by mouth at bedtime.     lisinopril 20 MG tablet  Commonly known as:  PRINIVIL,ZESTRIL  Take 20 mg by mouth daily.     nitroGLYCERIN 0.4 MG SL tablet  Commonly known as:  NITROSTAT  Place 0.4 mg under the tongue every 5 (five) minutes as needed. For chest pain     oxyCODONE 15 MG immediate release tablet  Commonly known as:  ROXICODONE  Take 1 tablet (15 mg total) by mouth every 4 (four) hours as needed for pain.     oxyCODONE 15 MG immediate release tablet  Commonly known as:  ROXICODONE  Take 15 mg by mouth every 4 (four) hours as needed. For pain     predniSONE 2.5 MG tablet  Commonly known as:  DELTASONE  Take 2.5 mg by mouth daily.     vitamin C 500  MG tablet  Commonly known as:  ASCORBIC ACID  Take 500 mg by mouth daily.     VITAMIN D (CHOLECALCIFEROL) PO  Take 2-3 tablets by mouth daily.     VITAMIN E PO  Take 2-3 tablets by mouth daily.         At home rest most of the time. Get up 9 or 10 times each day and take a 15 or 20 minute walk. No riding in the car and to your first postoperative appointment. If you have neck surgery you may shower from the chest down starting on the third postoperative day. If you had back surgery he may start showering on the third postoperative day with saran wrap wrapped around your incisional area 3 times. After the shower remove the saran wrap. Take pain medicine as needed and other medications as instructed. Call my office for an appointment.  SignedReinaldo Meeker, MD 09/04/2012, 9:03 AM

## 2012-09-04 NOTE — Progress Notes (Signed)
Pt discharged but awaiting for spouse to come

## 2012-09-04 NOTE — Progress Notes (Signed)
Pt wife  Arrived, AVS and discharge instruction given to both patient and spouse    Back precaution and good hand hygiene  And surgical incision instruction reinforced   Both verbalized good understation . Medicated with pain medication prior to d/c condition at discharge home was fairly stable. Walker delivered to pt room and pt  Sent home with all pt belongings. Azzie Roup RN

## 2012-09-16 DIAGNOSIS — M5137 Other intervertebral disc degeneration, lumbosacral region: Secondary | ICD-10-CM | POA: Diagnosis not present

## 2012-10-05 ENCOUNTER — Encounter: Payer: Self-pay | Admitting: Adult Health

## 2012-10-05 ENCOUNTER — Encounter: Payer: Self-pay | Admitting: *Deleted

## 2012-10-05 ENCOUNTER — Ambulatory Visit (INDEPENDENT_AMBULATORY_CARE_PROVIDER_SITE_OTHER): Payer: Medicare Other | Admitting: Adult Health

## 2012-10-05 VITALS — BP 130/64 | HR 76 | Ht 67.0 in | Wt 192.0 lb

## 2012-10-05 DIAGNOSIS — I25810 Atherosclerosis of coronary artery bypass graft(s) without angina pectoris: Secondary | ICD-10-CM | POA: Diagnosis not present

## 2012-10-05 DIAGNOSIS — E785 Hyperlipidemia, unspecified: Secondary | ICD-10-CM | POA: Diagnosis not present

## 2012-10-05 DIAGNOSIS — I10 Essential (primary) hypertension: Secondary | ICD-10-CM

## 2012-10-05 MED ORDER — LISINOPRIL 10 MG PO TABS: 10.0000 mg | ORAL_TABLET | Freq: Every day | ORAL | Status: DC

## 2012-10-05 NOTE — Assessment & Plan Note (Signed)
He has had 2 orthopedic surgery since being seen last to include a cervical spine repair with pins and a lumbar disc repair. As a result of this he has had a long recovery and is beginning physical rehabilitation. He can use to wear a back brace. He has been noticing some left-sided chest pain with shoulder aching and numbness intently in the left arm. He believes this is probably related to his recent cervical surgery but due to his recent history of CAD with stent placement, the patient has become concerned about recurrence of cardiac etiology of discomfort. We will plan a list of scan Myoview for diagnostic prognostic purposes in the setting of known CAD. As he is not quite ready to walk on a treadmill pharmacological stress test will be chosen. We will followup after tests completed to discuss results.

## 2012-10-05 NOTE — Assessment & Plan Note (Signed)
He has lost 30 pounds since being seen last after multiple surgeries. He is having some positional dizziness, on lisinopril 20 mg daily along with Cardura. He is concerned that he is on too high a dose now. I discussed that Cardura can cause these symptoms, he does not wish to change this medication dosage as it is been very helpful to him with BPH. All therefore decrease his lisinopril to 10 mg daily and followup with a blood pressure check. New prescription will be provided.

## 2012-10-05 NOTE — Patient Instructions (Addendum)
Schedule Lexiscan Myoview  Follow instructions given    Your physician recommends that you schedule a follow-up appointment after Lexiscan Myoview    Decrease Lisinopril to 10 mg daily

## 2012-10-05 NOTE — Assessment & Plan Note (Signed)
It is time to reassess his lipid status. We will plan on followup fasting lipids and LFTs can be done during the day he has not stress test.

## 2012-10-05 NOTE — Addendum Note (Signed)
Addended by: Meda Klinefelter D on: 10/05/2012 03:15 PM   Modules accepted: Orders

## 2012-10-05 NOTE — Progress Notes (Signed)
HPI: [name] is a 72 y/o CM patient of Dr. Dietrich Pates we are seeing on follow-up from recent hospitalization for chest pain with known history of CAD, CABG, with cardiac catheterization during recent admission and PCI of native circumflex with BMS. He was started on ASA and Plavix. 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 There are multiple other medical issues as well.    Since being seen last he fell and cracked cervical vertebrae. Had two surgeries to repair. Also lumbar surgery to assist with sciatica. He has recovered, and is now having some discomfort in the left side of his chest. Usually when lying down. Left arm and shoulder numbness and tingling as well. He thinks it may be arthritis, but with his history of heart disease, he is concerned about recurrence of cardiac pain. He has also lost approximately 30 pounds since being seen last. He feels dizzy and lightheaded when he gets out of bed in the morning or when he goes from a sitting to standing position, and feels as if he may be on too much antihypertensive medication.   Allergies  Allergen Reactions  . Morphine Other (See Comments)    REACTION: made him go crazy; "I had fun w/it; my family didn't think it was too funny"  . Metoprolol Other (See Comments)    REACTION:  Tachycardia; "don't remember how bad it was; it was so long ago"    Current Outpatient Prescriptions  Medication Sig Dispense Refill  . atorvastatin (LIPITOR) 20 MG tablet Take 20 mg by mouth daily.      . citalopram (CELEXA) 40 MG tablet Take 40 mg by mouth daily.      Marland Kitchen doxazosin (CARDURA) 8 MG tablet Take 8 mg by mouth at bedtime.       Marland Kitchen lisinopril (PRINIVIL,ZESTRIL) 20 MG tablet Take 20 mg by mouth daily.      . nitroGLYCERIN (NITROSTAT) 0.4 MG SL tablet Place 0.4 mg under the tongue every 5 (five) minutes as needed.  For chest pain      . oxyCODONE (ROXICODONE) 15 MG immediate release tablet Take 7.5 mg by mouth every 4 (four) hours as needed. For pain      . predniSONE (DELTASONE) 10 MG tablet Take 10 mg by mouth daily.      . vitamin C (ASCORBIC ACID) 500 MG tablet Take 500 mg by mouth daily.      Marland Kitchen VITAMIN D, CHOLECALCIFEROL, PO Take 2-3 tablets by mouth daily.      Marland Kitchen VITAMIN E PO Take 2-3 tablets by mouth daily.       No current facility-administered medications for this visit.    Past Medical History  Diagnosis Date  . Hyperlipidemia   . Small bowel problem     HAD ALOT OF SCAR TISSUE FROM PREVIOUS SURGERIES..NG WAS INSERTED ...Marland KitchenMarland KitchenNO SURGERY NEEDED...Marland KitchenMarland KitchenIN FOR 8 DAYS  . Disorder of blood     BEEN TREATED BY DERMATOLOGIST X 4 YRS..."BLOOD BLISTERS"  . Arthritis   . Gout   . Anxiety   . Hx MRSA infection     rt shoulder  . Pneumonia     "I've had it 3-4 times"  . Coronary artery disease     IN 2000   STENT PLACED IN 2012 sees Dr. Dietrich Pates, saw last 2013  . HTN (hypertension)     sees [name] in [place]  Past Surgical History  Procedure Laterality Date  . Sternal surg  2000    HAS HAD 5-6 ON HIS STERNUM; "caught MRSA in it"  . Lumbar laminectomy/decompression microdiscectomy  06/13/2011    Procedure: LUMBAR LAMINECTOMY/DECOMPRESSION MICRODISCECTOMY;  Surgeon: Karn Cassis;  Location: [facility];  Service: Neurosurgery;  Laterality: N/A;  Lumbar three, lumbar four-five Laminectomy  . Eye surgery      bilateral cataract  . Anterior cervical corpectomy  12/17/11  . Incision and drainage of wound  ~ 20ll; 12/03/11    "had infection in my right"  . Shoulder open rotator cuff repair  ~ 2011    right  . Peripherally inserted central catheter insertion  2011 & 11/2011  . Cataract extraction w/ intraocular lens  implant, bilateral  ? 2011  . Coronary artery bypass graft  2000    CABG X5  . Back surgery    . Tonsillectomy  1949  . Cholecystectomy  2006 "or after"  . Coronary  angioplasty with stent placement  2012  . Anterior cervical corpectomy  12/17/2011    Procedure: ANTERIOR CERVICAL CORPECTOMY;  Surgeon: Karn Cassis, MD;  Location: [facility];  Service: Neurosurgery;  Laterality: N/A;  Anterior Cervical Decompression Fusion Five to Thoracic Two with plating    ZOX:WRUEAV of systems complete and found to be negative unless listed above  PHYSICAL EXAM BP 130/64  Pulse 76  Ht 5\' 7"  (1.702 m)  Wt 192 lb (87.091 kg)  BMI 30.06 kg/m2 General: Well developed, well nourished, in no acute distress Head: Eyes PERRLA, No xanthomas.   Normal cephalic and atramatic  Lungs: Clear bilaterally to auscultation and percussion. Heart: HRRR S1 S2, distant heart sounds.l without MRG.  Pulses are 2+ & equal.            No carotid bruit. No JVD.  No abdominal bruits. No femoral bruits. Abdomen: Bowel sounds are positive, abdomen soft and non-tender without masses or                  Hernia's noted. Msk:  Back normal, normal gait. Normal strength and tone for age. Wearing a back brace. Extremities: No clubbing, cyanosis or edema.  DP +1 Neuro: Alert and oriented X 3. Psych:  Good affect, responds appropriately EKG:  ASSESSMENT AND PLAN

## 2012-10-18 ENCOUNTER — Other Ambulatory Visit: Payer: Self-pay | Admitting: *Deleted

## 2012-10-18 DIAGNOSIS — I25810 Atherosclerosis of coronary artery bypass graft(s) without angina pectoris: Secondary | ICD-10-CM

## 2012-10-19 ENCOUNTER — Encounter (HOSPITAL_COMMUNITY)
Admission: RE | Admit: 2012-10-19 | Discharge: 2012-10-19 | Disposition: A | Discharge status: Home / Self Care | Payer: Medicare Other | Source: Ambulatory Visit | Attending: Adult Health | Admitting: Adult Health

## 2012-10-19 ENCOUNTER — Ambulatory Visit (HOSPITAL_COMMUNITY)
Admission: RE | Admit: 2012-10-19 | Discharge: 2012-10-19 | Disposition: A | Discharge status: Home / Self Care | Payer: Medicare Other | Source: Ambulatory Visit | Attending: Cardiology | Admitting: Cardiology

## 2012-10-19 ENCOUNTER — Encounter (HOSPITAL_COMMUNITY): Payer: Self-pay

## 2012-10-19 DIAGNOSIS — I25810 Atherosclerosis of coronary artery bypass graft(s) without angina pectoris: Secondary | ICD-10-CM

## 2012-10-19 DIAGNOSIS — I10 Essential (primary) hypertension: Secondary | ICD-10-CM | POA: Diagnosis not present

## 2012-10-19 DIAGNOSIS — R079 Chest pain, unspecified: Secondary | ICD-10-CM

## 2012-10-19 DIAGNOSIS — E785 Hyperlipidemia, unspecified: Secondary | ICD-10-CM | POA: Diagnosis not present

## 2012-10-19 LAB — HEPATIC FUNCTION PANEL
ALT: 12 U/L (ref 0–53)
AST: 14 U/L (ref 0–37)
Bilirubin, Direct: 0.1 mg/dL (ref 0.0–0.3)
Total Protein: 5.9 g/dL — ABNORMAL LOW (ref 6.0–8.3)

## 2012-10-19 LAB — LIPID PANEL
Cholesterol: 135 mg/dL (ref 0–200)
Total CHOL/HDL Ratio: 3 Ratio
Triglycerides: 75 mg/dL (ref <150)

## 2012-10-19 MED ORDER — TECHNETIUM TC 99M SESTAMIBI - CARDIOLITE
10.0000 | Freq: Once | INTRAVENOUS | Status: AC | PRN
  Administered 2012-10-19: 11.9 via INTRAVENOUS

## 2012-10-19 MED ORDER — REGADENOSON 0.4 MG/5ML IV SOLN
INTRAVENOUS | Status: AC
  Administered 2012-10-19: 0.4 mg via INTRAVENOUS
  Filled 2012-10-19: qty 5

## 2012-10-19 MED ORDER — TECHNETIUM TC 99M SESTAMIBI - CARDIOLITE
30.0000 | Freq: Once | INTRAVENOUS | Status: AC | PRN
  Administered 2012-10-19: 11:00:00 31 via INTRAVENOUS

## 2012-10-19 MED ORDER — SODIUM CHLORIDE 0.9 % IJ SOLN
INTRAMUSCULAR | Status: AC
  Administered 2012-10-19: 10 mL via INTRAVENOUS
  Filled 2012-10-19: qty 10

## 2012-10-19 NOTE — Progress Notes (Signed)
Stress Lab Nurses Notes - [name]  [name] [facility] 10/19/2012 Reason for doing test: CAD and Chest Pain Type of test: [name] Nurse performing test: Parke Poisson, RN Nuclear Medicine Tech: [name] Echo Tech: Not Applicable MD performing test: [name] Family MD: [name] Test explained and consent signed: yes IV started: 22g jelco, Saline lock flushed, No redness or edema and Saline lock started in radiology Symptoms: SOB & Stomach discomfort Treatment/Intervention: None Reason test stopped: protocol completed After recovery IV was: Discontinued via X-ray tech and No redness or edema Patient to return to Nuc. Med at : 12:00 Patient discharged: Home Patient's Condition upon discharge was: stable Comments: During test BP 128/58 & HR 84.  Recovery BP 124/60 & HR 83.  Symptoms resolved in recovery. [name]

## 2012-10-22 ENCOUNTER — Encounter: Payer: Self-pay | Admitting: *Deleted

## 2012-10-25 DIAGNOSIS — M5137 Other intervertebral disc degeneration, lumbosacral region: Secondary | ICD-10-CM | POA: Diagnosis not present

## 2012-10-26 ENCOUNTER — Encounter: Payer: Self-pay | Admitting: Adult Health

## 2012-10-26 ENCOUNTER — Ambulatory Visit (INDEPENDENT_AMBULATORY_CARE_PROVIDER_SITE_OTHER): Payer: Medicare Other | Admitting: Adult Health

## 2012-10-26 VITALS — BP 75/45 | HR 78 | Ht 72.0 in | Wt 188.1 lb

## 2012-10-26 DIAGNOSIS — I10 Essential (primary) hypertension: Secondary | ICD-10-CM | POA: Diagnosis not present

## 2012-10-26 DIAGNOSIS — I2510 Atherosclerotic heart disease of native coronary artery without angina pectoris: Secondary | ICD-10-CM | POA: Diagnosis not present

## 2012-10-26 MED ORDER — LISINOPRIL 2.5 MG PO TABS: 2.5000 mg | ORAL_TABLET | Freq: Every day | ORAL | Status: DC

## 2012-10-26 NOTE — Assessment & Plan Note (Signed)
I discuss results of stress test that were ordered post discharge at the request of discharging cardiologist. Stress test was normal without evidence of recurrent ischemia or areas of progressive CAD. He is given reassurance. The patient now thinks that his discomfort may be related to some arthritis.

## 2012-10-26 NOTE — Progress Notes (Deleted)
Name: [name]    DOB: 1941-06-11  Age: 72 y.o.  MR#: [id number]       PCP:  Fredirick Maudlin, MD      Insurance: Payor: MEDICARE  Plan: MEDICARE PART A AND B  Product Type: *No Product type*    CC:    Chief Complaint  Patient presents with  . Coronary Artery Disease  . Hypertension    VS Filed Vitals:   10/26/12 1124  BP: 134/66  Pulse: 77  Height: 6' (1.829 m)  Weight: 188 lb 1.9 oz (85.331 kg)  SpO2: 93%    Weights Current Weight  10/26/12 188 lb 1.9 oz (85.331 kg)  10/05/12 192 lb (87.091 kg)  09/01/12 195 lb 8.8 oz (88.7 kg)    Blood Pressure  BP Readings from Last 3 Encounters:  10/26/12 134/66  10/05/12 130/64  09/04/12 119/57     Admit date:  (Not on file) Last encounter with RMR:  10/05/2012   Allergy Morphine and Metoprolol  Current Outpatient Prescriptions  Medication Sig Dispense Refill  . atorvastatin (LIPITOR) 20 MG tablet Take 20 mg by mouth daily.      . citalopram (CELEXA) 40 MG tablet Take 40 mg by mouth daily.      Marland Kitchen doxazosin (CARDURA) 8 MG tablet Take 8 mg by mouth at bedtime.       Marland Kitchen lisinopril (PRINIVIL,ZESTRIL) 10 MG tablet Take 1 tablet (10 mg total) by mouth daily.  30 tablet  6  . nitroGLYCERIN (NITROSTAT) 0.4 MG SL tablet Place 0.4 mg under the tongue every 5 (five) minutes as needed. For chest pain      . oxyCODONE (ROXICODONE) 15 MG immediate release tablet Take 7.5 mg by mouth every 4 (four) hours as needed. For pain      . predniSONE (DELTASONE) 10 MG tablet Take 10 mg by mouth daily.      . vitamin C (ASCORBIC ACID) 500 MG tablet Take 500 mg by mouth daily.      Marland Kitchen VITAMIN D, CHOLECALCIFEROL, PO Take 2-3 tablets by mouth daily.      Marland Kitchen VITAMIN E PO Take 2-3 tablets by mouth daily.       No current facility-administered medications for this visit.    Discontinued Meds:   There are no discontinued medications.  Patient Active Problem List  Diagnosis  . MRSA  . HYPERLIPIDEMIA  . HYPERTENSION  . GASTROESOPHAGEAL REFLUX  DISEASE  . VENTRAL HERNIA, INCISIONAL  . PEMPHIGUS VULGARIS  . PRURITUS  . PYOGENIC ARTHRITIS, SHOULDER REGION  . DEGENERATIVE JOINT DISEASE  . SPINAL STENOSIS, LUMBAR  . RUPTURE ROTATOR CUFF  . Ankle fracture  . Contusion of shoulder, left  . CAD in native artery  . Rotator cuff tear arthropathy of right shoulder  . Trigger point with neck pain    LABS    Component Value Date/Time   NA 134* 08/31/2012 1231   NA 135 08/26/2012 1433   NA 136 01/04/2012 0822   K 3.8 08/31/2012 1231   K 3.9 08/26/2012 1433   K 4.5 01/04/2012 0822   CL 97 08/31/2012 1231   CL 102 08/26/2012 1433   CL 96 01/04/2012 0822   CO2 28 08/31/2012 1231   CO2 29 01/04/2012 0822   CO2 29 12/12/2011 1028   GLUCOSE 116* 08/31/2012 1231   GLUCOSE 96 08/26/2012 1433   GLUCOSE 122* 01/04/2012 0822   BUN 15 08/31/2012 1231   BUN 21 08/26/2012 1433   BUN 19 01/04/2012  1610   CREATININE 1.31 09/01/2012 2027   CREATININE 1.30 08/31/2012 1231   CREATININE 1.60* 08/26/2012 1433   CALCIUM 9.9 08/31/2012 1231   CALCIUM 9.8 01/04/2012 0822   CALCIUM 9.6 12/12/2011 1028   GFRNONAA 54* 09/01/2012 2027   GFRNONAA 54* 08/31/2012 1231   GFRNONAA 50* 01/04/2012 0822   GFRAA 62* 09/01/2012 2027   GFRAA 63* 08/31/2012 1231   GFRAA 58* 01/04/2012 0822   CMP     Component Value Date/Time   NA 134* 08/31/2012 1231   K 3.8 08/31/2012 1231   CL 97 08/31/2012 1231   CO2 28 08/31/2012 1231   GLUCOSE 116* 08/31/2012 1231   BUN 15 08/31/2012 1231   CREATININE 1.31 09/01/2012 2027   CALCIUM 9.9 08/31/2012 1231   PROT 5.9* 10/19/2012 0745   ALBUMIN 3.8 10/19/2012 0745   AST 14 10/19/2012 0745   ALT 12 10/19/2012 0745   ALKPHOS 93 10/19/2012 0745   BILITOT 0.5 10/19/2012 0745   GFRNONAA 54* 09/01/2012 2027   GFRAA 62* 09/01/2012 2027       Component Value Date/Time   WBC 9.8 09/01/2012 2027   WBC 8.0 08/31/2012 1231   WBC 8.2 01/04/2012 0822   HGB 9.8* 09/01/2012 2027   HGB 11.4* 08/31/2012 1231   HGB 11.6* 08/26/2012 1433   HCT 29.7* 09/01/2012 2027   HCT 34.3* 08/31/2012 1231   HCT 34.0*  08/26/2012 1433   MCV 90.3 09/01/2012 2027   MCV 90.0 08/31/2012 1231   MCV 85.2 01/04/2012 0822    Lipid Panel     Component Value Date/Time   CHOL 135 10/19/2012 0745   TRIG 75 10/19/2012 0745   HDL 45 10/19/2012 0745   CHOLHDL 3.0 10/19/2012 0745   VLDL 15 10/19/2012 0745   LDLCALC 75 10/19/2012 0745    ABG    Component Value Date/Time   HCO3 25.6* 06/13/2011 1041   TCO2 26 08/26/2012 1433   O2SAT 94.0 06/13/2011 1041     Lab Results  Component Value Date   TSH 0.211* 12/31/2010   BNP (last 3 results) No results found for this basename: PROBNP,  in the last 8760 hours Cardiac Panel (last 3 results) No results found for this basename: CKTOTAL, CKMB, TROPONINI, RELINDX,  in the last 72 hours  Iron/TIBC/Ferritin No results found for this basename: iron, tibc, ferritin     EKG Orders placed in visit on 10/05/12  . EKG 12-LEAD     Prior Assessment and Plan Problem List as of 10/26/2012     ICD-9-CM   CAD in native artery   Last Assessment & Plan   10/05/2012 Office Visit Written 10/05/2012  3:07 PM by Jodelle Gross, NP     He has had 2 orthopedic surgery since being seen last to include a cervical spine repair with pins and a lumbar disc repair. As a result of this he has had a long recovery and is beginning physical rehabilitation. He can use to wear a back brace. He has been noticing some left-sided chest pain with shoulder aching and numbness intently in the left arm. He believes this is probably related to his recent cervical surgery but due to his recent history of CAD with stent placement, the patient has become concerned about recurrence of cardiac etiology of discomfort. We will plan a list of scan Myoview for diagnostic prognostic purposes in the setting of known CAD. As he is not quite ready to walk on a treadmill pharmacological stress test will be  chosen. We will followup after tests completed to discuss results.    MRSA   HYPERLIPIDEMIA   Last Assessment & Plan    10/05/2012 Office Visit Written 10/05/2012  3:09 PM by Jodelle Gross, NP     It is time to reassess his lipid status. We will plan on followup fasting lipids and LFTs can be done during the day he has not stress test.    HYPERTENSION   Last Assessment & Plan   10/05/2012 Office Visit Written 10/05/2012  3:09 PM by Jodelle Gross, NP     He has lost 30 pounds since being seen last after multiple surgeries. He is having some positional dizziness, on lisinopril 20 mg daily along with Cardura. He is concerned that he is on too high a dose now. I discussed that Cardura can cause these symptoms, he does not wish to change this medication dosage as it is been very helpful to him with BPH. All therefore decrease his lisinopril to 10 mg daily and followup with a blood pressure check. New prescription will be provided.    GASTROESOPHAGEAL REFLUX DISEASE   VENTRAL HERNIA, INCISIONAL   PEMPHIGUS VULGARIS   PRURITUS   PYOGENIC ARTHRITIS, SHOULDER REGION   DEGENERATIVE JOINT DISEASE   SPINAL STENOSIS, LUMBAR   RUPTURE ROTATOR CUFF   Ankle fracture   Contusion of shoulder, left   Rotator cuff tear arthropathy of right shoulder   Trigger point with neck pain       Imaging: Nm Myocar Single W/spect W/wall Motion And Ef  10/19/2012  Lexiscan Myovue:  Indication: Chest Pain  The patient received .4 mg of lexiscan as a bolus. Resting ECG was normal and did not change with infusion.  HR increased to 84 bpm and BP stable at 128/58.  Mild dyspnea. The images were reconstructed in the vertical horizontal and short axis planes.  RAW images showed motion QPS: normal Surface images normal EF 65%  Tomographic images with no ischemia or infarction  Impression: Normal lexiscan myovue EF 65%  Charlton Haws MD [facility]   Original Report Authenticated By: Charlton Haws, M.D.

## 2012-10-26 NOTE — Patient Instructions (Addendum)
Your physician recommends that you schedule a follow-up appointment in: 3 months with [facility]   Your physician has recommended you make the following change in your medication:   1) DECREASE LISINOPRIL TO 2.5MG  ONE TABLET IN THE MORNING  Your physician recommends that you schedule a follow-up appointment in: WITH A NURSE VISIT TO RE-CHECK YOU BLOOD PRESSURE ON FRIDAY 10-29-12

## 2012-10-26 NOTE — Assessment & Plan Note (Signed)
He has not taken lisinopril 10 mg for 3 days in the setting of frequent diarrhea with a GI bug he is experienced. Blood pressure is low normal on initial assessment. I have asked for orthostatics. Lying blood pressure 108/67 with a heart rate of 69, sitting blood pressure 92/53 with a heart rate of 74, standing blood pressure 75/45 with a heart rate of 78. He did complain of some mild dizziness with position change. I will have him restart lisinopril at a greatly reduced dose of 2.5 mg daily starting tomorrow. He is advised he can drink fluids today. He will have a followup blood pressure check in 3 days for further assessment. He is also taking good bit of pain medication for her lower back pain which is chronic for him. This may be playing into his hypotension as well. We will do close followup on blood pressure evaluation.

## 2012-10-26 NOTE — Progress Notes (Signed)
HPI: [name] is a 72 y/o CM patient of Dr. Dietrich Pates we are seeing for ongoing assessment and management of CAD, CABG, with cardiac catheterization during recent admission and PCI of native circumflex with BMS. He was started on ASA and Plavix. 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 There are multiple other medical issues as well. Last visit the patient is having dizziness with position changes which was felt to be related to antihypertensive medications and continued chest pain. The patient was planned for a stress Myoview with recurrent chest pain after stent placement.. Lisinopril was decreased to 10 mg daily from 20 mg daily to 2 positional dizziness. Stress test completed on 10/19/2012 revealed normal without evidence of ischemia or infarction with an EF of 65%   He comes today feeling some better after decrease of lisinopril. He has, however, had a GI and and has had diarrhea for the last 3-4 days. As a result of this he is not taking his lisinopril at all. He is begin eating again and is feeling a little stronger.   Allergies  Allergen Reactions  . Morphine Other (See Comments)    REACTION: made him go crazy; "I had fun w/it; my family didn't think it was too funny"  . Metoprolol Other (See Comments)    REACTION:  Tachycardia; "don't remember how bad it was; it was so long ago"    Current Outpatient Prescriptions  Medication Sig Dispense Refill  . atorvastatin (LIPITOR) 20 MG tablet Take 20 mg by mouth daily.      . citalopram (CELEXA) 40 MG tablet Take 40 mg by mouth daily.      Marland Kitchen doxazosin (CARDURA) 8 MG tablet Take 8 mg by mouth at bedtime.       . nitroGLYCERIN (NITROSTAT) 0.4 MG SL tablet Place 0.4 mg under the tongue every 5 (five) minutes as needed. For chest pain      . oxyCODONE (ROXICODONE) 15 MG immediate release tablet Take  7.5 mg by mouth every 4 (four) hours as needed. For pain      . predniSONE (DELTASONE) 10 MG tablet Take 10 mg by mouth daily.      . vitamin C (ASCORBIC ACID) 500 MG tablet Take 500 mg by mouth daily.      Marland Kitchen VITAMIN D, CHOLECALCIFEROL, PO Take 2-3 tablets by mouth daily.      Marland Kitchen VITAMIN E PO Take 2-3 tablets by mouth daily.      Marland Kitchen lisinopril (PRINIVIL,ZESTRIL) 2.5 MG tablet Take 1 tablet (2.5 mg total) by mouth daily.  90 tablet  1   No current facility-administered medications for this visit.    Past Medical History  Diagnosis Date  . Hyperlipidemia   . Small bowel problem     HAD ALOT OF SCAR TISSUE FROM PREVIOUS SURGERIES..NG WAS INSERTED ...Marland KitchenMarland KitchenNO SURGERY NEEDED...Marland KitchenMarland KitchenIN FOR 8 DAYS  . Disorder of blood     BEEN TREATED BY DERMATOLOGIST X 4 YRS..."BLOOD BLISTERS"  . Arthritis   . Gout   . Anxiety   . Hx MRSA infection     rt shoulder  . Pneumonia     "I've had it 3-4 times"  . Coronary artery disease     IN 2000   STENT PLACED IN 2012 sees Dr. Dietrich Pates, saw last 2013  . HTN (hypertension)     sees [name] in [place]  Past Surgical History  Procedure Laterality Date  . Sternal surg  2000    HAS HAD 5-6 ON HIS STERNUM; "caught MRSA in it"  . Lumbar laminectomy/decompression microdiscectomy  06/13/2011    Procedure: LUMBAR LAMINECTOMY/DECOMPRESSION MICRODISCECTOMY;  Surgeon: Karn Cassis;  Location: [facility];  Service: Neurosurgery;  Laterality: N/A;  Lumbar three, lumbar four-five Laminectomy  . Eye surgery      bilateral cataract  . Anterior cervical corpectomy  12/17/11  . Incision and drainage of wound  ~ 20ll; 12/03/11    "had infection in my right"  . Shoulder open rotator cuff repair  ~ 2011    right  . Peripherally inserted central catheter insertion  2011 & 11/2011  . Cataract extraction w/ intraocular lens  implant, bilateral  ? 2011  . Coronary artery bypass graft  2000    CABG X5  . Back surgery    . Tonsillectomy  1949  . Cholecystectomy  2006  "or after"  . Coronary angioplasty with stent placement  2012  . Anterior cervical corpectomy  12/17/2011    Procedure: ANTERIOR CERVICAL CORPECTOMY;  Surgeon: Karn Cassis, MD;  Location: [facility];  Service: Neurosurgery;  Laterality: N/A;  Anterior Cervical Decompression Fusion Five to Thoracic Two with plating    ROS:.Review of systems complete and found to be negative unless listed above  PHYSICAL EXAM BP 75/45  Pulse 78  Ht 6' (1.829 m)  Wt 188 lb 1.9 oz (85.331 kg)  BMI 25.51 kg/m2  SpO2 93%  General: Well developed, well nourished, in no acute distress. Head: Eyes PERRLA, No xanthomas.   Normal cephalic and atramatic Lungs: Clear bilaterally to auscultation and percussion. Heart: HRRR S1 S2, without MRG.  Pulses are 2+ & equal.            No carotid bruit. No JVD.  No abdominal bruits. No femoral bruits. Abdomen: Bowel sounds are positive, abdomen soft and non-tender without masses or  Hernia's noted. Msk:  Back normal, normal gait. Normal strength and tone for age. Extremities: No clubbing, cyanosis or edema.  DP +1 Neuro: Alert and oriented X 3. Psych:  Good affect, responds appropriately    ASSESSMENT AND PLAN

## 2012-10-28 DIAGNOSIS — M25559 Pain in unspecified hip: Secondary | ICD-10-CM | POA: Diagnosis not present

## 2012-10-28 DIAGNOSIS — M5430 Sciatica, unspecified side: Secondary | ICD-10-CM | POA: Diagnosis not present

## 2012-10-28 DIAGNOSIS — M545 Low back pain: Secondary | ICD-10-CM | POA: Diagnosis not present

## 2012-10-29 ENCOUNTER — Ambulatory Visit (INDEPENDENT_AMBULATORY_CARE_PROVIDER_SITE_OTHER): Payer: Medicare Other | Admitting: *Deleted

## 2012-10-29 VITALS — BP 70/48 | HR 72 | Ht 72.0 in | Wt 191.0 lb

## 2012-10-29 DIAGNOSIS — I10 Essential (primary) hypertension: Secondary | ICD-10-CM

## 2012-10-29 MED ORDER — DOXAZOSIN MESYLATE 4 MG PO TABS: 4.0000 mg | ORAL_TABLET | Freq: Every day | ORAL | Status: DC

## 2012-10-29 NOTE — Patient Instructions (Signed)
Your physician recommends that you schedule a follow-up appointment in: Nurse visit next Friday April 11 th @ 10:00  Your physician has recommended you make the following change in your medication:  1 - STOP Lisinopril 2 - Cut Cardura in half to 4 mg daily

## 2012-10-29 NOTE — Progress Notes (Signed)
Presents for repeat blood pressure check, post OV on 4/1, where Lisinopril was decreased from 10 mg to 2.5.  Patient c/o low BP at home and continued swimmy headedness.  Consulted with Joni Reining, NP, who advised to stop lisinopril, decrease cardura to 4 mg daily and repeat bp check next Friday.  Patient aware and verbalized understanding.

## 2012-11-02 DIAGNOSIS — M25559 Pain in unspecified hip: Secondary | ICD-10-CM | POA: Diagnosis not present

## 2012-11-02 DIAGNOSIS — M545 Low back pain: Secondary | ICD-10-CM | POA: Diagnosis not present

## 2012-11-02 DIAGNOSIS — M5430 Sciatica, unspecified side: Secondary | ICD-10-CM | POA: Diagnosis not present

## 2012-11-05 ENCOUNTER — Ambulatory Visit (INDEPENDENT_AMBULATORY_CARE_PROVIDER_SITE_OTHER): Payer: Medicare Other | Admitting: *Deleted

## 2012-11-05 VITALS — BP 121/77 | HR 66 | Ht 72.0 in | Wt 187.8 lb

## 2012-11-05 DIAGNOSIS — I959 Hypotension, unspecified: Secondary | ICD-10-CM

## 2012-11-05 NOTE — Patient Instructions (Signed)
Your physician recommends that you schedule a follow-up appointment in: WE WILL CONTACT WITH ANY CHANGES IF NOTED

## 2012-11-05 NOTE — Progress Notes (Signed)
Presents for repeat BP check after decreasing lisinopril to 2.5 on 4/1 when BP was 75/45 78.  Denies complaints at this time.  States takimg medications as directed.

## 2012-11-05 NOTE — Progress Notes (Signed)
Stop lisinopril. Increase fluid intake.

## 2012-11-05 NOTE — Progress Notes (Signed)
Patient had stopped lisinopril last week.  Advised to increase fluid intake.  Verbalized understanding.

## 2012-11-09 DIAGNOSIS — M5430 Sciatica, unspecified side: Secondary | ICD-10-CM | POA: Diagnosis not present

## 2012-11-09 DIAGNOSIS — M25559 Pain in unspecified hip: Secondary | ICD-10-CM | POA: Diagnosis not present

## 2012-11-09 DIAGNOSIS — M545 Low back pain: Secondary | ICD-10-CM | POA: Diagnosis not present

## 2012-11-10 DIAGNOSIS — M216X9 Other acquired deformities of unspecified foot: Secondary | ICD-10-CM | POA: Diagnosis not present

## 2012-11-10 DIAGNOSIS — I10 Essential (primary) hypertension: Secondary | ICD-10-CM | POA: Diagnosis not present

## 2012-11-11 DIAGNOSIS — M545 Low back pain: Secondary | ICD-10-CM | POA: Diagnosis not present

## 2012-11-11 DIAGNOSIS — M5430 Sciatica, unspecified side: Secondary | ICD-10-CM | POA: Diagnosis not present

## 2012-11-11 DIAGNOSIS — M25559 Pain in unspecified hip: Secondary | ICD-10-CM | POA: Diagnosis not present

## 2012-11-17 DIAGNOSIS — M545 Low back pain: Secondary | ICD-10-CM | POA: Diagnosis not present

## 2012-11-17 DIAGNOSIS — M25559 Pain in unspecified hip: Secondary | ICD-10-CM | POA: Diagnosis not present

## 2012-11-17 DIAGNOSIS — M5430 Sciatica, unspecified side: Secondary | ICD-10-CM | POA: Diagnosis not present

## 2012-11-18 DIAGNOSIS — M5430 Sciatica, unspecified side: Secondary | ICD-10-CM | POA: Diagnosis not present

## 2012-11-18 DIAGNOSIS — M25559 Pain in unspecified hip: Secondary | ICD-10-CM | POA: Diagnosis not present

## 2012-11-18 DIAGNOSIS — M545 Low back pain: Secondary | ICD-10-CM | POA: Diagnosis not present

## 2012-11-22 DIAGNOSIS — M25559 Pain in unspecified hip: Secondary | ICD-10-CM | POA: Diagnosis not present

## 2012-11-22 DIAGNOSIS — M545 Low back pain: Secondary | ICD-10-CM | POA: Diagnosis not present

## 2012-11-22 DIAGNOSIS — M5430 Sciatica, unspecified side: Secondary | ICD-10-CM | POA: Diagnosis not present

## 2012-11-29 DIAGNOSIS — M5430 Sciatica, unspecified side: Secondary | ICD-10-CM | POA: Diagnosis not present

## 2012-11-29 DIAGNOSIS — M545 Low back pain: Secondary | ICD-10-CM | POA: Diagnosis not present

## 2012-11-29 DIAGNOSIS — M25559 Pain in unspecified hip: Secondary | ICD-10-CM | POA: Diagnosis not present

## 2012-12-01 DIAGNOSIS — M5430 Sciatica, unspecified side: Secondary | ICD-10-CM | POA: Diagnosis not present

## 2012-12-01 DIAGNOSIS — M25559 Pain in unspecified hip: Secondary | ICD-10-CM | POA: Diagnosis not present

## 2012-12-01 DIAGNOSIS — M545 Low back pain: Secondary | ICD-10-CM | POA: Diagnosis not present

## 2012-12-06 DIAGNOSIS — M25559 Pain in unspecified hip: Secondary | ICD-10-CM | POA: Diagnosis not present

## 2012-12-06 DIAGNOSIS — M5430 Sciatica, unspecified side: Secondary | ICD-10-CM | POA: Diagnosis not present

## 2012-12-06 DIAGNOSIS — M545 Low back pain: Secondary | ICD-10-CM | POA: Diagnosis not present

## 2012-12-13 DIAGNOSIS — IMO0002 Reserved for concepts with insufficient information to code with codable children: Secondary | ICD-10-CM | POA: Diagnosis not present

## 2012-12-24 ENCOUNTER — Other Ambulatory Visit: Payer: Self-pay | Admitting: Neurosurgery

## 2012-12-24 DIAGNOSIS — M25552 Pain in left hip: Secondary | ICD-10-CM

## 2012-12-27 DIAGNOSIS — E785 Hyperlipidemia, unspecified: Secondary | ICD-10-CM | POA: Diagnosis not present

## 2012-12-27 DIAGNOSIS — I10 Essential (primary) hypertension: Secondary | ICD-10-CM | POA: Diagnosis not present

## 2012-12-27 DIAGNOSIS — M25559 Pain in unspecified hip: Secondary | ICD-10-CM | POA: Diagnosis not present

## 2012-12-27 DIAGNOSIS — I259 Chronic ischemic heart disease, unspecified: Secondary | ICD-10-CM | POA: Diagnosis not present

## 2012-12-28 DIAGNOSIS — I10 Essential (primary) hypertension: Secondary | ICD-10-CM | POA: Diagnosis not present

## 2012-12-28 DIAGNOSIS — R634 Abnormal weight loss: Secondary | ICD-10-CM | POA: Diagnosis not present

## 2012-12-28 DIAGNOSIS — E785 Hyperlipidemia, unspecified: Secondary | ICD-10-CM | POA: Diagnosis not present

## 2012-12-28 DIAGNOSIS — I259 Chronic ischemic heart disease, unspecified: Secondary | ICD-10-CM | POA: Diagnosis not present

## 2012-12-28 LAB — TSH: TSH: 1.29 (ref <=5.90)

## 2013-01-01 ENCOUNTER — Ambulatory Visit
Admission: RE | Admit: 2013-01-01 | Discharge: 2013-01-01 | Disposition: A | Discharge status: Home / Self Care | Payer: Medicare Other | Source: Ambulatory Visit | Attending: Neurosurgery | Admitting: Neurosurgery

## 2013-01-01 DIAGNOSIS — M169 Osteoarthritis of hip, unspecified: Secondary | ICD-10-CM | POA: Diagnosis not present

## 2013-01-01 DIAGNOSIS — M25552 Pain in left hip: Secondary | ICD-10-CM

## 2013-01-25 DIAGNOSIS — N400 Enlarged prostate without lower urinary tract symptoms: Secondary | ICD-10-CM | POA: Diagnosis not present

## 2013-01-25 DIAGNOSIS — I259 Chronic ischemic heart disease, unspecified: Secondary | ICD-10-CM | POA: Diagnosis not present

## 2013-01-25 DIAGNOSIS — E109 Type 1 diabetes mellitus without complications: Secondary | ICD-10-CM | POA: Diagnosis not present

## 2013-01-25 DIAGNOSIS — M25559 Pain in unspecified hip: Secondary | ICD-10-CM | POA: Diagnosis not present

## 2013-01-27 DIAGNOSIS — R7989 Other specified abnormal findings of blood chemistry: Secondary | ICD-10-CM | POA: Diagnosis not present

## 2013-01-27 DIAGNOSIS — Z125 Encounter for screening for malignant neoplasm of prostate: Secondary | ICD-10-CM | POA: Diagnosis not present

## 2013-01-27 DIAGNOSIS — E785 Hyperlipidemia, unspecified: Secondary | ICD-10-CM | POA: Diagnosis not present

## 2013-01-27 DIAGNOSIS — I10 Essential (primary) hypertension: Secondary | ICD-10-CM | POA: Diagnosis not present

## 2013-02-01 ENCOUNTER — Ambulatory Visit (INDEPENDENT_AMBULATORY_CARE_PROVIDER_SITE_OTHER): Payer: Medicare Other

## 2013-02-01 ENCOUNTER — Encounter: Payer: Self-pay | Admitting: Orthopedic Surgery

## 2013-02-01 ENCOUNTER — Ambulatory Visit (INDEPENDENT_AMBULATORY_CARE_PROVIDER_SITE_OTHER): Payer: Medicare Other | Admitting: Orthopedic Surgery

## 2013-02-01 VITALS — BP 148/74 | Ht 72.0 in | Wt 187.0 lb

## 2013-02-01 DIAGNOSIS — M25559 Pain in unspecified hip: Secondary | ICD-10-CM

## 2013-02-01 DIAGNOSIS — M25552 Pain in left hip: Secondary | ICD-10-CM

## 2013-02-01 NOTE — Patient Instructions (Addendum)
activities as tolerated 

## 2013-02-01 NOTE — Progress Notes (Signed)
Patient ID: [name], male   DOB: Dec 14, 1940, 72 y.o.   MRN: [id number] Chief Complaint  Patient presents with  . Hip Pain    Left hip and upper thigh. Referred by Dr. Jeral Fruit    72 year old male status post recent lumbar fusion with foot drop currently in a AFO presents with lateral thigh pain anterior thigh pain left iliac crest pain and MRI showing bilateral avascular necrosis, bilateral osteoarthritis of the hip, left anterior labral tear  Review of symptoms negative  Past Surgical History  Procedure Laterality Date  . Sternal surg  2000    HAS HAD 5-6 ON HIS STERNUM; "caught MRSA in it"  . Lumbar laminectomy/decompression microdiscectomy  06/13/2011    Procedure: LUMBAR LAMINECTOMY/DECOMPRESSION MICRODISCECTOMY;  Surgeon: Karn Cassis;  Location: [facility];  Service: Neurosurgery;  Laterality: N/A;  Lumbar three, lumbar four-five Laminectomy  . Eye surgery      bilateral cataract  . Anterior cervical corpectomy  12/17/11  . Incision and drainage of wound  ~ 20ll; 12/03/11    "had infection in my right"  . Shoulder open rotator cuff repair  ~ 2011    right  . Peripherally inserted central catheter insertion  2011 & 11/2011  . Cataract extraction w/ intraocular lens  implant, bilateral  ? 2011  . Coronary artery bypass graft  2000    CABG X5  . Back surgery    . Tonsillectomy  1949  . Cholecystectomy  2006 "or after"  . Coronary angioplasty with stent placement  2012  . Anterior cervical corpectomy  12/17/2011    Procedure: ANTERIOR CERVICAL CORPECTOMY;  Surgeon: Karn Cassis, MD;  Location: [facility];  Service: Neurosurgery;  Laterality: N/A;  Anterior Cervical Decompression Fusion Five to Thoracic Two with plating   Past Medical History  Diagnosis Date  . Hyperlipidemia   . Small bowel problem     HAD ALOT OF SCAR TISSUE FROM PREVIOUS SURGERIES..NG WAS INSERTED ...Marland KitchenMarland KitchenNO SURGERY NEEDED...Marland KitchenMarland KitchenIN FOR 8 DAYS  . Disorder of blood     BEEN TREATED BY DERMATOLOGIST X  4 YRS..."BLOOD BLISTERS"  . Arthritis   . Gout   . Anxiety   . Hx MRSA infection     rt shoulder  . Pneumonia     "I've had it 3-4 times"  . Coronary artery disease     IN 2000   STENT PLACED IN 2012 sees Dr. Dietrich Pates, saw last 2013  . HTN (hypertension)     sees [name] in [place]    BP 148/74  Ht 6' (1.829 m)  Wt 187 lb (84.823 kg)  BMI 25.36 kg/m2 General appearance is normal, the patient is alert and oriented x3 with normal mood and affect. He is ambulating with an AFO on a typical foot drop gait with the brace on.  Right hip flexion is normal left hip flexion is 110 some pain asymptomatic internal/external rotation abduction adduction tenderness anterior thigh and groin especially along the adductor musculature, stability confirmed. Skin intact. Motor exam hip flexors normal abductors normal. Distal pulses are intact.  The MRI of the hip shows bilateral femoral AVN no subchondral collapse, mild bilateral hip osteoarthritis, anterior superior quadrant left acetabular labral tear  The AVN is probably a stage I or perhaps to but no higher grade than that. The arthritis expected for age. Unclear whether the acetabular labral tear is actually symptomatic.  At this point based on the patient's medical history current symptomatology and improvement with steroids I  would advise nonoperative treatment. We can inject his hip with steroids if pain persists after the oral steroid treatment  Is unclear whether his lateral thigh pain and iliac crest pain are related to his acetabular labral tear which should really get him groin pain and anterior thigh pain.

## 2013-02-13 ENCOUNTER — Encounter (HOSPITAL_COMMUNITY): Payer: Self-pay | Admitting: Emergency Medicine

## 2013-02-13 ENCOUNTER — Emergency Department (HOSPITAL_COMMUNITY)
Admission: EM | Admit: 2013-02-13 | Discharge: 2013-02-13 | Disposition: A | Discharge status: Home / Self Care | Payer: Medicare Other | Attending: Emergency Medicine | Admitting: Emergency Medicine

## 2013-02-13 ENCOUNTER — Emergency Department (HOSPITAL_COMMUNITY): Payer: Medicare Other

## 2013-02-13 DIAGNOSIS — Z8719 Personal history of other diseases of the digestive system: Secondary | ICD-10-CM | POA: Insufficient documentation

## 2013-02-13 DIAGNOSIS — X500XXA Overexertion from strenuous movement or load, initial encounter: Secondary | ICD-10-CM | POA: Insufficient documentation

## 2013-02-13 DIAGNOSIS — Z862 Personal history of diseases of the blood and blood-forming organs and certain disorders involving the immune mechanism: Secondary | ICD-10-CM | POA: Insufficient documentation

## 2013-02-13 DIAGNOSIS — Z9861 Coronary angioplasty status: Secondary | ICD-10-CM | POA: Insufficient documentation

## 2013-02-13 DIAGNOSIS — E785 Hyperlipidemia, unspecified: Secondary | ICD-10-CM | POA: Insufficient documentation

## 2013-02-13 DIAGNOSIS — I10 Essential (primary) hypertension: Secondary | ICD-10-CM | POA: Insufficient documentation

## 2013-02-13 DIAGNOSIS — Z8614 Personal history of Methicillin resistant Staphylococcus aureus infection: Secondary | ICD-10-CM | POA: Insufficient documentation

## 2013-02-13 DIAGNOSIS — Z87891 Personal history of nicotine dependence: Secondary | ICD-10-CM | POA: Diagnosis not present

## 2013-02-13 DIAGNOSIS — Z8701 Personal history of pneumonia (recurrent): Secondary | ICD-10-CM | POA: Diagnosis not present

## 2013-02-13 DIAGNOSIS — S83419A Sprain of medial collateral ligament of unspecified knee, initial encounter: Secondary | ICD-10-CM | POA: Diagnosis not present

## 2013-02-13 DIAGNOSIS — S83412A Sprain of medial collateral ligament of left knee, initial encounter: Secondary | ICD-10-CM

## 2013-02-13 DIAGNOSIS — F411 Generalized anxiety disorder: Secondary | ICD-10-CM | POA: Diagnosis not present

## 2013-02-13 DIAGNOSIS — Z87828 Personal history of other (healed) physical injury and trauma: Secondary | ICD-10-CM | POA: Diagnosis not present

## 2013-02-13 DIAGNOSIS — Z79899 Other long term (current) drug therapy: Secondary | ICD-10-CM | POA: Diagnosis not present

## 2013-02-13 DIAGNOSIS — I2510 Atherosclerotic heart disease of native coronary artery without angina pectoris: Secondary | ICD-10-CM | POA: Diagnosis not present

## 2013-02-13 DIAGNOSIS — Y9301 Activity, walking, marching and hiking: Secondary | ICD-10-CM | POA: Insufficient documentation

## 2013-02-13 DIAGNOSIS — IMO0002 Reserved for concepts with insufficient information to code with codable children: Secondary | ICD-10-CM | POA: Insufficient documentation

## 2013-02-13 DIAGNOSIS — Z8639 Personal history of other endocrine, nutritional and metabolic disease: Secondary | ICD-10-CM | POA: Insufficient documentation

## 2013-02-13 DIAGNOSIS — Y929 Unspecified place or not applicable: Secondary | ICD-10-CM | POA: Insufficient documentation

## 2013-02-13 DIAGNOSIS — Z8739 Personal history of other diseases of the musculoskeletal system and connective tissue: Secondary | ICD-10-CM | POA: Diagnosis not present

## 2013-02-13 DIAGNOSIS — M25569 Pain in unspecified knee: Secondary | ICD-10-CM | POA: Diagnosis not present

## 2013-02-13 NOTE — ED Notes (Signed)
Pt walking Friday and heard something pop in Left knee. Has been ambulatory. Nad. No obvious swelling or deformity noted.

## 2013-02-13 NOTE — ED Provider Notes (Signed)
Medical screening examination/treatment/procedure(s) were performed by non-physician practitioner and as supervising physician I was immediately available for consultation/collaboration.  Tessla Spurling L Donnalee Cellucci, MD 02/13/13 1941 

## 2013-02-13 NOTE — ED Provider Notes (Signed)
History    CSN: [id number] Arrival date & time 02/13/13  1716  First MD Initiated Contact with Patient 02/13/13 1838     Chief Complaint  Patient presents with  . Knee Pain   (Consider location/radiation/quality/duration/timing/severity/associated sxs/prior Treatment) Patient is a 72 y.o. male presenting with knee pain. The history is provided by the patient.  Knee Pain Location:  Knee Time since incident:  3 days Injury: yes   Knee location:  L knee Pain details:    Quality:  Shooting   Radiates to:  Does not radiate   Severity:  Moderate   Onset quality:  Sudden   Duration:  3 days   Timing:  Constant   Progression:  Unchanged Chronicity:  New Dislocation: no   Foreign body present:  No foreign bodies Prior injury to area:  No Relieved by:  Nothing Worsened by:  Bearing weight, extension and flexion Ineffective treatments:  None tried Associated symptoms: decreased ROM   Associated symptoms: no fever and no swelling    [name] is a 72 y.o. male who presents to the ED with knee pain. States he got up to walk to the bathroom and felt a pop in his left knee and has had pain on the medial aspect of his knee since then. The pain is moderate. Hx of back surgery and wears a brace on his left lower leg as a result of foot drop.  Past Medical History  Diagnosis Date  . Hyperlipidemia   . Small bowel problem     HAD ALOT OF SCAR TISSUE FROM PREVIOUS SURGERIES..NG WAS INSERTED ...Marland KitchenMarland KitchenNO SURGERY NEEDED...Marland KitchenMarland KitchenIN FOR 8 DAYS  . Disorder of blood     BEEN TREATED BY DERMATOLOGIST X 4 YRS..."BLOOD BLISTERS"  . Arthritis   . Gout   . Anxiety   . Hx MRSA infection     rt shoulder  . Pneumonia     "I've had it 3-4 times"  . Coronary artery disease     IN 2000   STENT PLACED IN 2012 sees Dr. Dietrich Pates, saw last 2013  . HTN (hypertension)     sees [name] in [place]   Past Surgical History  Procedure Laterality Date  . Sternal surg  2000    HAS HAD 5-6 ON HIS  STERNUM; "caught MRSA in it"  . Lumbar laminectomy/decompression microdiscectomy  06/13/2011    Procedure: LUMBAR LAMINECTOMY/DECOMPRESSION MICRODISCECTOMY;  Surgeon: Karn Cassis;  Location: [facility];  Service: Neurosurgery;  Laterality: N/A;  Lumbar three, lumbar four-five Laminectomy  . Eye surgery      bilateral cataract  . Anterior cervical corpectomy  12/17/11  . Incision and drainage of wound  ~ 20ll; 12/03/11    "had infection in my right"  . Shoulder open rotator cuff repair  ~ 2011    right  . Peripherally inserted central catheter insertion  2011 & 11/2011  . Cataract extraction w/ intraocular lens  implant, bilateral  ? 2011  . Coronary artery bypass graft  2000    CABG X5  . Back surgery    . Tonsillectomy  1949  . Cholecystectomy  2006 "or after"  . Coronary angioplasty with stent placement  2012  . Anterior cervical corpectomy  12/17/2011    Procedure: ANTERIOR CERVICAL CORPECTOMY;  Surgeon: Karn Cassis, MD;  Location: [facility];  Service: Neurosurgery;  Laterality: N/A;  Anterior Cervical Decompression Fusion Five to Thoracic Two with plating   Family History  Problem Relation Age  of Onset  . Heart disease    . Hypertension Mother   . Hypertension Maternal Aunt   . Hypertension Maternal Uncle   . Anesthesia problems Neg Hx   . Hypotension Neg Hx   . Malignant hyperthermia Neg Hx   . Pseudochol deficiency Neg Hx    History  Substance Use Topics  . Smoking status: Former Smoker -- 1.00 packs/day for 1 years    Types: Cigarettes    Quit date: 12/27/1998  . Smokeless tobacco: Never Used  . Alcohol Use: No    Review of Systems  Constitutional: Negative for fever.  Gastrointestinal: Negative for nausea, vomiting and abdominal pain.  Musculoskeletal:       Left knee pain  Skin: Negative for wound.  Neurological: Negative for dizziness and light-headedness.  Psychiatric/Behavioral: The patient is not nervous/anxious.     Allergies  Morphine and  Metoprolol  Home Medications   Current Outpatient Rx  Name  Route  Sig  Dispense  Refill  . atorvastatin (LIPITOR) 20 MG tablet   Oral   Take 20 mg by mouth at bedtime.          . citalopram (CELEXA) 40 MG tablet   Oral   Take 40 mg by mouth daily.         Marland Kitchen doxazosin (CARDURA) 4 MG tablet   Oral   Take 8 mg by mouth at bedtime.         Marland Kitchen oxyCODONE (ROXICODONE) 15 MG immediate release tablet   Oral   Take 7.5 mg by mouth every 4 (four) hours as needed. For pain         . predniSONE (DELTASONE) 10 MG tablet   Oral   Take 10 mg by mouth daily.         . vitamin C (ASCORBIC ACID) 500 MG tablet   Oral   Take 500 mg by mouth daily.         Marland Kitchen VITAMIN D, CHOLECALCIFEROL, PO   Oral   Take 2-3 tablets by mouth daily.         Marland Kitchen VITAMIN E PO   Oral   Take 2-3 tablets by mouth daily.         . nitroGLYCERIN (NITROSTAT) 0.4 MG SL tablet   Sublingual   Place 0.4 mg under the tongue every 5 (five) minutes as needed. For chest pain          BP 164/66  Pulse 73  Temp(Src) 98.4 F (36.9 C) (Oral)  Resp 19  SpO2 98% Physical Exam  Nursing note and vitals reviewed. Constitutional: He is oriented to person, place, and time. He appears well-developed and well-nourished. No distress.  HENT:  Head: Normocephalic.  Eyes: EOM are normal.  Neck: Neck supple.  Cardiovascular: Normal rate.   Pulmonary/Chest: Effort normal.  Musculoskeletal:       Left knee: He exhibits decreased range of motion. He exhibits no swelling, no effusion, no deformity, no laceration and no erythema. Tenderness found. MCL tenderness noted.       Legs: Pedal pulse present, adequate circulation.  Neurological: He is alert and oriented to person, place, and time. No cranial nerve deficit.  Skin: Skin is warm and dry.  Psychiatric: He has a normal mood and affect. His behavior is normal.    ED Course  Procedures (including critical care time) Labs Reviewed - No data to display Dg Knee  Complete 4 Views Left  02/13/2013   *RADIOLOGY REPORT*  Clinical Data:  The patient heard something pop in the left knee 2 days ago.  Pain.  LEFT KNEE - COMPLETE 4+ VIEW  Comparison: None.  Findings: No fracture.  The joint is normally spaced and aligned. There is no joint effusion.  Vascular calcifications are noted posteriorly.  The soft tissues are otherwise unremarkable.  IMPRESSION: No fracture or knee joint abnormality.   Original Report Authenticated By: Amie Portland, M.D.    MDM: Dr. Effie Shy in to examine the patient.  72 y.o. with left knee pain x 3 days. Medial collateral ligament strain. Will place in knee immobilizer and patient will take his pain medication he has at home. He will follow up with Dr. Romeo Apple this week. He will return here as needed. Verbal and written instructions to patient. Patient voices understanding.    [place], [place] 02/13/13 [phone]

## 2013-02-17 ENCOUNTER — Encounter: Payer: Self-pay | Admitting: Orthopedic Surgery

## 2013-02-17 ENCOUNTER — Ambulatory Visit (INDEPENDENT_AMBULATORY_CARE_PROVIDER_SITE_OTHER): Payer: Medicare Other | Admitting: Orthopedic Surgery

## 2013-02-17 VITALS — BP 142/74 | Ht 72.0 in | Wt 185.0 lb

## 2013-02-17 DIAGNOSIS — M23322 Other meniscus derangements, posterior horn of medial meniscus, left knee: Secondary | ICD-10-CM

## 2013-02-17 DIAGNOSIS — IMO0002 Reserved for concepts with insufficient information to code with codable children: Secondary | ICD-10-CM | POA: Insufficient documentation

## 2013-02-17 DIAGNOSIS — M23329 Other meniscus derangements, posterior horn of medial meniscus, unspecified knee: Secondary | ICD-10-CM | POA: Insufficient documentation

## 2013-02-17 DIAGNOSIS — S8392XA Sprain of unspecified site of left knee, initial encounter: Secondary | ICD-10-CM

## 2013-02-17 NOTE — Progress Notes (Signed)
Patient ID: [name], male   DOB: Aug 15, 1940, 72 y.o.   MRN: [id number] Chief Complaint  Patient presents with  . Knee Pain    Left knee pain and locking, no injury    History Mr. Sherk was in his usual state of health and then on Saturday morning he felt a pop in his left knee the next day couldn't walk it is a walker since then his symptoms have resolved. Pain came on suddenly he still having 4-10 medial pain he did have some locking initially that seems to have resolved no numbness or tingling. Review of systems listed as negative except for easy bleeding and bruising he is on a blood thinner  His exam shows a well-developed well-nourished thin male who has lost some weight he is oriented x3 his mood and affect are normal he is ambulating with no assistive device but he does have a AF on his left ankle secondary to foot drop after back surgery recently. He has a mild medial joint line tenderness no joint effusion painless range of motion the knee remains stable there is a little gapping without distress but minimal pain with that strength and muscle tone are normal skin is intact has good pulse sensation is normal he has a little bit of pain with McMurray's maneuver but no evidence of a positive finding    Impression sprain knee possible posterior horn derangement  X-rays show minimal if any degenerative change  I told to watch out for mechanical symptoms and come back if his knee pops locks or gives out

## 2013-02-28 DIAGNOSIS — I10 Essential (primary) hypertension: Secondary | ICD-10-CM | POA: Diagnosis not present

## 2013-02-28 DIAGNOSIS — I259 Chronic ischemic heart disease, unspecified: Secondary | ICD-10-CM | POA: Diagnosis not present

## 2013-02-28 DIAGNOSIS — R21 Rash and other nonspecific skin eruption: Secondary | ICD-10-CM | POA: Diagnosis not present

## 2013-02-28 DIAGNOSIS — E785 Hyperlipidemia, unspecified: Secondary | ICD-10-CM | POA: Diagnosis not present

## 2013-03-01 ENCOUNTER — Encounter (HOSPITAL_COMMUNITY): Payer: Self-pay | Admitting: Cardiovascular Disease

## 2013-04-17 ENCOUNTER — Encounter (HOSPITAL_COMMUNITY): Payer: Self-pay | Admitting: *Deleted

## 2013-04-17 ENCOUNTER — Emergency Department (HOSPITAL_COMMUNITY): Payer: Medicare Other

## 2013-04-17 ENCOUNTER — Inpatient Hospital Stay (HOSPITAL_COMMUNITY)
Admission: EM | Admit: 2013-04-17 | Discharge: 2013-04-19 | DRG: 389 | Disposition: A | Discharge status: Home / Self Care | Payer: Medicare Other | Attending: Pulmonary Disease | Admitting: Pulmonary Disease

## 2013-04-17 DIAGNOSIS — Z87891 Personal history of nicotine dependence: Secondary | ICD-10-CM | POA: Diagnosis not present

## 2013-04-17 DIAGNOSIS — E785 Hyperlipidemia, unspecified: Secondary | ICD-10-CM | POA: Diagnosis not present

## 2013-04-17 DIAGNOSIS — N289 Disorder of kidney and ureter, unspecified: Secondary | ICD-10-CM

## 2013-04-17 DIAGNOSIS — F329 Major depressive disorder, single episode, unspecified: Secondary | ICD-10-CM | POA: Diagnosis present

## 2013-04-17 DIAGNOSIS — K56609 Unspecified intestinal obstruction, unspecified as to partial versus complete obstruction: Secondary | ICD-10-CM

## 2013-04-17 DIAGNOSIS — Z888 Allergy status to other drugs, medicaments and biological substances status: Secondary | ICD-10-CM

## 2013-04-17 DIAGNOSIS — F3289 Other specified depressive episodes: Secondary | ICD-10-CM | POA: Diagnosis present

## 2013-04-17 DIAGNOSIS — K432 Incisional hernia without obstruction or gangrene: Secondary | ICD-10-CM | POA: Diagnosis present

## 2013-04-17 DIAGNOSIS — F411 Generalized anxiety disorder: Secondary | ICD-10-CM | POA: Diagnosis present

## 2013-04-17 DIAGNOSIS — K561 Intussusception: Secondary | ICD-10-CM | POA: Diagnosis not present

## 2013-04-17 DIAGNOSIS — Z79899 Other long term (current) drug therapy: Secondary | ICD-10-CM

## 2013-04-17 DIAGNOSIS — Z951 Presence of aortocoronary bypass graft: Secondary | ICD-10-CM

## 2013-04-17 DIAGNOSIS — I2510 Atherosclerotic heart disease of native coronary artery without angina pectoris: Secondary | ICD-10-CM | POA: Diagnosis present

## 2013-04-17 DIAGNOSIS — I10 Essential (primary) hypertension: Secondary | ICD-10-CM | POA: Diagnosis present

## 2013-04-17 DIAGNOSIS — R109 Unspecified abdominal pain: Secondary | ICD-10-CM | POA: Diagnosis not present

## 2013-04-17 DIAGNOSIS — M129 Arthropathy, unspecified: Secondary | ICD-10-CM | POA: Diagnosis present

## 2013-04-17 DIAGNOSIS — M109 Gout, unspecified: Secondary | ICD-10-CM | POA: Diagnosis present

## 2013-04-17 DIAGNOSIS — Z9861 Coronary angioplasty status: Secondary | ICD-10-CM

## 2013-04-17 DIAGNOSIS — K565 Intestinal adhesions [bands], unspecified as to partial versus complete obstruction: Secondary | ICD-10-CM | POA: Diagnosis not present

## 2013-04-17 DIAGNOSIS — N179 Acute kidney failure, unspecified: Secondary | ICD-10-CM | POA: Diagnosis not present

## 2013-04-17 HISTORY — DX: Acute kidney failure, unspecified: N17.9

## 2013-04-17 HISTORY — DX: Ventral hernia without obstruction or gangrene: K43.9

## 2013-04-17 HISTORY — DX: Unspecified intestinal obstruction, unspecified as to partial versus complete obstruction: K56.609

## 2013-04-17 LAB — COMPREHENSIVE METABOLIC PANEL
Alkaline Phosphatase: 64 U/L (ref 39–117)
BUN: 25 mg/dL — ABNORMAL HIGH (ref 6–23)
Calcium: 9.4 mg/dL (ref 8.4–10.5)
GFR calc Af Amer: 45 mL/min — ABNORMAL LOW (ref >90)
Glucose, Bld: 126 mg/dL — ABNORMAL HIGH (ref 70–99)
Total Protein: 5.7 g/dL — ABNORMAL LOW (ref 6.0–8.3)

## 2013-04-17 LAB — URINALYSIS W MICROSCOPIC + REFLEX CULTURE
Bilirubin Urine: NEGATIVE
Glucose, UA: NEGATIVE mg/dL
Hgb urine dipstick: NEGATIVE
Ketones, ur: NEGATIVE mg/dL
Protein, ur: NEGATIVE mg/dL
Urobilinogen, UA: 0.2 mg/dL (ref 0.0–1.0)

## 2013-04-17 LAB — TROPONIN I: Troponin I: <0.3 ng/mL (ref <0.30)

## 2013-04-17 LAB — CBC WITH DIFFERENTIAL/PLATELET
Basophils Absolute: 0 10*3/uL (ref 0.0–0.1)
Basophils Relative: 0 % (ref 0–1)
Eosinophils Absolute: 0.1 10*3/uL (ref 0.0–0.7)
Eosinophils Relative: 2 % (ref 0–5)
Hemoglobin: 12.4 g/dL — ABNORMAL LOW (ref 13.0–17.0)
Lymphs Abs: 1.2 10*3/uL (ref 0.7–4.0)
MCH: 29.7 pg (ref 26.0–34.0)
MCV: 89.2 fL (ref 78.0–100.0)
Monocytes Relative: 11 % (ref 3–12)
Neutro Abs: 7.1 10*3/uL (ref 1.7–7.7)
RBC: 4.17 MIL/uL — ABNORMAL LOW (ref 4.22–5.81)
RDW: 15.4 % (ref 11.5–15.5)

## 2013-04-17 LAB — LIPASE, BLOOD: Lipase: 26 U/L (ref 11–59)

## 2013-04-17 LAB — LACTIC ACID, PLASMA: Lactic Acid, Venous: 1.3 mmol/L (ref 0.5–2.2)

## 2013-04-17 MED ORDER — DOXAZOSIN MESYLATE 8 MG PO TABS
8.0000 mg | ORAL_TABLET | Freq: Every day | ORAL | Status: DC
  Administered 2013-04-17 – 2013-04-18 (×2): 8 mg via ORAL
  Filled 2013-04-17 (×2): qty 1

## 2013-04-17 MED ORDER — FENTANYL CITRATE 0.05 MG/ML IJ SOLN
100.0000 ug | INTRAMUSCULAR | Status: DC | PRN
  Administered 2013-04-17: 100 ug via INTRAVENOUS
  Filled 2013-04-17: qty 2

## 2013-04-17 MED ORDER — HYDROMORPHONE HCL PF 1 MG/ML IJ SOLN
0.5000 mg | Freq: Once | INTRAMUSCULAR | Status: AC
  Administered 2013-04-17: 0.5 mg via INTRAVENOUS
  Filled 2013-04-17: qty 1

## 2013-04-17 MED ORDER — HYDROMORPHONE HCL PF 1 MG/ML IJ SOLN
0.5000 mg | INTRAMUSCULAR | Status: DC | PRN
  Administered 2013-04-17 – 2013-04-18 (×16): 0.5 mg via INTRAVENOUS
  Filled 2013-04-17 (×16): qty 1

## 2013-04-17 MED ORDER — ACETAMINOPHEN 325 MG PO TABS: 650.0000 mg | ORAL_TABLET | Freq: Four times a day (QID) | ORAL | Status: DC | PRN

## 2013-04-17 MED ORDER — CITALOPRAM HYDROBROMIDE 20 MG PO TABS
40.0000 mg | ORAL_TABLET | Freq: Every day | ORAL | Status: DC
  Administered 2013-04-17: 40 mg via ORAL
  Filled 2013-04-17: qty 2

## 2013-04-17 MED ORDER — SODIUM CHLORIDE 0.9 % IV SOLN
INTRAVENOUS | Status: DC
  Administered 2013-04-17: 08:00:00 via INTRAVENOUS

## 2013-04-17 MED ORDER — NITROGLYCERIN 0.4 MG SL SUBL: 0.4000 mg | SUBLINGUAL_TABLET | SUBLINGUAL | Status: DC | PRN

## 2013-04-17 MED ORDER — ONDANSETRON HCL 4 MG PO TABS: 4.0000 mg | ORAL_TABLET | Freq: Four times a day (QID) | ORAL | Status: DC | PRN

## 2013-04-17 MED ORDER — ONDANSETRON HCL 4 MG/2ML IJ SOLN
4.0000 mg | Freq: Four times a day (QID) | INTRAMUSCULAR | Status: DC | PRN
  Administered 2013-04-17 – 2013-04-18 (×2): 4 mg via INTRAVENOUS
  Filled 2013-04-17 (×3): qty 2

## 2013-04-17 MED ORDER — SODIUM CHLORIDE 0.9 % IV SOLN
INTRAVENOUS | Status: DC
  Administered 2013-04-17: 04:00:00 via INTRAVENOUS

## 2013-04-17 MED ORDER — HYDROMORPHONE HCL PF 1 MG/ML IJ SOLN: 0.5000 mg | Freq: Once | INTRAMUSCULAR | Status: DC

## 2013-04-17 MED ORDER — MORPHINE SULFATE 4 MG/ML IJ SOLN: 4.0000 mg | INTRAMUSCULAR | Status: DC | PRN

## 2013-04-17 MED ORDER — ACETAMINOPHEN 650 MG RE SUPP: 650.0000 mg | Freq: Four times a day (QID) | RECTAL | Status: DC | PRN

## 2013-04-17 MED ORDER — INFLUENZA VAC SPLIT QUAD 0.5 ML IM SUSP
0.5000 mL | INTRAMUSCULAR | Status: AC
  Filled 2013-04-17: qty 0.5

## 2013-04-17 MED ORDER — HYDROMORPHONE HCL PF 1 MG/ML IJ SOLN: 0.5000 mg | INTRAMUSCULAR | Status: DC | PRN

## 2013-04-17 MED ORDER — ONDANSETRON HCL 4 MG/2ML IJ SOLN
4.0000 mg | INTRAMUSCULAR | Status: AC | PRN
  Administered 2013-04-17 (×2): 4 mg via INTRAVENOUS
  Filled 2013-04-17 (×2): qty 2

## 2013-04-17 MED ORDER — KCL IN DEXTROSE-NACL 20-5-0.45 MEQ/L-%-% IV SOLN
INTRAVENOUS | Status: DC
  Administered 2013-04-17 – 2013-04-18 (×4): via INTRAVENOUS

## 2013-04-17 MED ORDER — PREDNISONE 10 MG PO TABS
10.0000 mg | ORAL_TABLET | Freq: Every day | ORAL | Status: DC
  Administered 2013-04-17: 10 mg via ORAL
  Filled 2013-04-17: qty 1

## 2013-04-17 MED ORDER — ONDANSETRON HCL 4 MG/2ML IJ SOLN: 4.0000 mg | Freq: Three times a day (TID) | INTRAMUSCULAR | Status: DC | PRN

## 2013-04-17 MED ORDER — SODIUM CHLORIDE 0.9 % IV SOLN: INTRAVENOUS | Status: DC

## 2013-04-17 MED ORDER — FENTANYL CITRATE 0.05 MG/ML IJ SOLN
50.0000 ug | INTRAMUSCULAR | Status: AC | PRN
  Administered 2013-04-17 (×2): 50 ug via INTRAVENOUS
  Filled 2013-04-17 (×2): qty 2

## 2013-04-17 NOTE — ED Notes (Signed)
Patient decided that he would like something else for pain.

## 2013-04-17 NOTE — H&P (Signed)
History and Physical  [name] ZOX:[id number] DOB: 12-14-1940 DOA: 04/17/2013  Referring physician: Samuel Jester, MD PCP: Fredirick Maudlin, MD   Chief Complaint: abdominal pain  HPI:  72-yom presented to the emergency department with sudden onset of lower abdominal pain. Initial evaluation notable for small bowel obstruction. Treat with analgesics and referred for admission.  History obtained from patient. She has a history of small bowel obstruction and multiple surgeries. He has felt relatively well lately. Last bowel movement approximately 48 hours ago. Developed sudden lower abdominal pain 9/20 10 PM, cramping, maximum intensity 8/10. No definite aggravating or alleviating factors. One episode of vomiting at home.  In the emergency department noted to be afebrile with stable vital signs. Laboratory studies notable for acute renal failure BUN 25/creatinine 1.72. Lactic acid and CBC unremarkable. Urinalysis negative. Chest x-ray nonacute. CT of the abdomen and pelvis demonstrated small bowel obstruction suggest an etiology adhesive disease.  Review of Systems:  Negative for fever, visual changes, sore throat, rash, new muscle aches, chest pain, SOB, dysuria, bleeding  Past Medical History  Diagnosis Date  . Hyperlipidemia   . Small bowel problem     HAD ALOT OF SCAR TISSUE FROM PREVIOUS SURGERIES..NG WAS INSERTED ...Marland KitchenMarland KitchenNO SURGERY NEEDED...Marland KitchenMarland KitchenIN FOR 8 DAYS  . Disorder of blood     BEEN TREATED BY DERMATOLOGIST X 4 YRS..."BLOOD BLISTERS"  . Arthritis   . Gout   . Anxiety   . Hx MRSA infection     rt shoulder  . Pneumonia     "I've had it 3-4 times"  . Coronary artery disease     IN 2000   STENT PLACED IN 2012 sees Dr. Dietrich Pates, saw last 2013  . HTN (hypertension)     sees [name] in [place]  . Small bowel obstruction   . Ventral hernia     Past Surgical History  Procedure Laterality Date  . Sternal surg  2000    HAS HAD 5-6 ON HIS STERNUM; "caught MRSA in it"   . Lumbar laminectomy/decompression microdiscectomy  06/13/2011    Procedure: LUMBAR LAMINECTOMY/DECOMPRESSION MICRODISCECTOMY;  Surgeon: Karn Cassis;  Location: [facility];  Service: Neurosurgery;  Laterality: N/A;  Lumbar three, lumbar four-five Laminectomy  . Eye surgery      bilateral cataract  . Anterior cervical corpectomy  12/17/11  . Incision and drainage of wound  ~ 20ll; 12/03/11    "had infection in my right"  . Shoulder open rotator cuff repair  ~ 2011    right  . Peripherally inserted central catheter insertion  2011 & 11/2011  . Cataract extraction w/ intraocular lens  implant, bilateral  ? 2011  . Coronary artery bypass graft  2000    CABG X5  . Back surgery      lumbar  . Tonsillectomy  1949  . Cholecystectomy  2006 "or after"  . Coronary angioplasty with stent placement  2012  . Anterior cervical corpectomy  12/17/2011    Procedure: ANTERIOR CERVICAL CORPECTOMY;  Surgeon: Karn Cassis, MD;  Location: [facility];  Service: Neurosurgery;  Laterality: N/A;  Anterior Cervical Decompression Fusion Five to Thoracic Two with plating    Social History:  reports that he quit smoking about 14 years ago. His smoking use included Cigarettes. He has a 1 pack-year smoking history. He has never used smokeless tobacco. He reports that he does not drink alcohol or use illicit drugs.  Allergies  Allergen Reactions  . Morphine Other (See Comments)  REACTION: made him go crazy; "I had fun w/it; my family didn't think it was too funny"  . Metoprolol Other (See Comments)    REACTION:  Tachycardia; "don't remember how bad it was; it was so long ago"    Family History  Problem Relation Age of Onset  . Heart disease    . Hypertension Mother   . Hypertension Maternal Aunt   . Hypertension Maternal Uncle   . Anesthesia problems Neg Hx   . Hypotension Neg Hx   . Malignant hyperthermia Neg Hx   . Pseudochol deficiency Neg Hx      Prior to Admission medications    Medication Sig Start Date End Date Taking? Authorizing Provider  atorvastatin (LIPITOR) 20 MG tablet Take 20 mg by mouth at bedtime.    Yes Historical Provider, MD  oxyCODONE (ROXICODONE) 15 MG immediate release tablet Take 7.5 mg by mouth every 4 (four) hours as needed. For pain   Yes Historical Provider, MD  predniSONE (DELTASONE) 10 MG tablet Take 10 mg by mouth daily.   Yes Historical Provider, MD  vitamin C (ASCORBIC ACID) 500 MG tablet Take 500 mg by mouth daily.   Yes Historical Provider, MD  VITAMIN D, CHOLECALCIFEROL, PO Take 2-3 tablets by mouth daily.   Yes Historical Provider, MD  VITAMIN E PO Take 2-3 tablets by mouth daily.   Yes Historical Provider, MD  citalopram (CELEXA) 40 MG tablet Take 40 mg by mouth daily.    Historical Provider, MD  doxazosin (CARDURA) 4 MG tablet Take 8 mg by mouth at bedtime. 10/29/12   Jodelle Gross, NP  nitroGLYCERIN (NITROSTAT) 0.4 MG SL tablet Place 0.4 mg under the tongue every 5 (five) minutes as needed. For chest pain 01/09/11   Jodelle Gross, NP   Physical Exam: Filed Vitals:   04/17/13 0253 04/17/13 0629 04/17/13 0700  BP: 128/63 103/43 116/67  Pulse: 68 64 59  Temp: 98.3 F (36.8 C)  97.9 F (36.6 C)  TempSrc: Oral Oral Oral  Resp: 20 18 20   Height: 6' (1.829 m)    Weight: 90.719 kg (200 lb)    SpO2: 100% 98% 99%   General: Appears calm, moderately uncomfortable. Somewhat restless. Nontoxic, no acute distress. Eyes: PERRL, normal lids, irises  ENT: grossly normal hearing, lips & tongue Neck: no LAD, masses or thyromegaly Cardiovascular: RRR, no m/r/g. No LE edema. Respiratory: CTA bilaterally, no w/r/r. Normal respiratory effort. Abdomen: Ventral hernia noted, scarring noted. Lower abdominal pain noted. Soft. Skin: no rash or induration seen  Musculoskeletal: grossly normal tone BUE/BLE, able to stand without assistance Psychiatric: grossly normal mood and affect, speech fluent and appropriate Neurologic: grossly  non-focal.  Wt Readings from Last 3 Encounters:  04/17/13 90.719 kg (200 lb)  02/17/13 83.915 kg (185 lb)  02/01/13 84.823 kg (187 lb)    Labs on Admission:  Basic Metabolic Panel:  Recent Labs Lab 04/17/13 0343  NA 135  K 3.9  CL 98  CO2 30  GLUCOSE 126*  BUN 25*  CREATININE 1.72*  CALCIUM 9.4    Liver Function Tests:  Recent Labs Lab 04/17/13 0343  AST 15  ALT 15  ALKPHOS 64  BILITOT 0.5  PROT 5.7*  ALBUMIN 3.4*    Recent Labs Lab 04/17/13 0343  LIPASE 26   CBC:  Recent Labs Lab 04/17/13 0343  WBC 9.5  NEUTROABS 7.1  HGB 12.4*  HCT 37.2*  MCV 89.2  PLT 198    Cardiac Enzymes:  Recent  Labs Lab 04/17/13 0343  TROPONINI <0.30    Radiological Exams on Admission: Ct Abdomen Pelvis Wo Contrast  04/17/2013   CLINICAL DATA:  Abdominal pain  EXAM: CT ABDOMEN AND PELVIS WITHOUT CONTRAST  TECHNIQUE: Multidetector CT imaging of the abdomen and pelvis was performed following the standard protocol without intravenous contrast.  COMPARISON:  07/16/2011  FINDINGS: Calcified granuloma in the superior segment right lower lobe with a calcified right infrahilar lymph node. Remainder of lung bases clear. Patchy coronary and aortoiliac arterial calcifications. Multiple small calcified granulomas in the spleen and liver. Surgical clips in the gallbladder fossa. Small hiatal hernia. Stomach distended by ingested material. Incomplete distal passage of the oral contrast material. Previous ventral hernia mesh repair. The proximal small bowel is decompressed. There is fluid distention of multiple mid small bowel loops with transition to decompressed distal small bowel in the right lower quadrant, with associated small bowel feces sign. No discrete mass or abscess as source of the distal small bowel obstruction. The colon is nondilated. Urinary bladder is physiologically distended. Moderate prostatic enlargement with central coarse calcifications. No ascites. No free air. No  adenopathy localized. Changes of lumbar decompression L2-3, L3-4, PLIF L4-S1. Spondylitic changes in the visualized lower thoracic and lumbar spine.  IMPRESSION: 1. Mid/distal small bowel obstruction with transition point in the right lower quadrant, no evident etiology, suggesting adhesions as most likely source.   Electronically Signed   By: Oley Balm M.D.   On: 04/17/2013 05:43   Dg Abd Acute W/chest  04/17/2013   CLINICAL DATA:  Abdominal pain, nausea.  EXAM: ACUTE ABDOMEN SERIES (ABDOMEN 2 VIEW & CHEST 1 VIEW)  COMPARISON:  01/08/2012  FINDINGS: Chronic blunting of costophrenic angles. Previous CABG. Dense probably calcified right lower lung nodule and right infrahilar lymph nodes. Vascular clips project over the mediastinum, and in the left axilla. Orthopedic anchors project over the right humeral head. Cervical fixation hardware noted. Heart size normal. Lungs remain clear.  No free air on the erect film. A few gas distended mid abdominal small bowel loops with fluid levels on the erect film. Moderate fecal material in the proximal colon, decompressed distally . Fixation hardware in the lower lumbar spine. Innumerable small splenic calcified granulomas. Laparoscopic ventral hernia mesh sutures. Spondylitic changes in the lumbar spine.  IMPRESSION: 1. Distended mid abdominal small bowel loops with fluid levels suggesting early ileus versusearly/partial mid small bowel obstruction. 2. No free air. 3. Thoracic postoperative changes without acute abnormality.   Electronically Signed   By: Oley Balm M.D.   On: 04/17/2013 04:47    EKG: Independently reviewed. Sinus rhythm. No acute changes.   Principal Problem:   Small bowel obstruction Active Problems:   CAD in native artery   Acute renal failure   Assessment/Plan 1. Small bowel obstruction with lower abdominal pain: N.p.o., analgesics. Discussed with Dr. Marylene Land per surgery. 2. Acute renal failure: May have an element of  chronic kidney disease. IV fluids. Recheck labs in the morning. 3. History of coronary artery disease, CABG, stent placement: Asymptomatic with no recent chest pain or shortness of breath. Managed medically, last seen by cardiology 10/2012. Normal Myoview 09/2012. He has a listed allergy to beta blockers, he cannot recall but has not been on any beta blockers or aspirin. 4. Ventral hernia  Code Status: Full code  DVT prophylaxis: SCDs Family Communication: None present Disposition Plan/Anticipated LOS: Admission. 2-4 days.  Time spent: 50 minutes  Brendia Sacks, MD  [facility] Pager [phone] 04/17/2013, 7:48  AM

## 2013-04-17 NOTE — ED Notes (Signed)
Patient states that he does not care for any pain medications at this time.

## 2013-04-17 NOTE — ED Provider Notes (Signed)
CSN: [id number]     Arrival date & time 04/17/13  0242 History   First MD Initiated Contact with Patient 04/17/13 0258     Chief Complaint  Patient presents with  . Abdominal Pain    HPI Pt was seen at 0315.  Per pt, c/o gradual onset and persistence of constant generalized abd "pain" since last night.  Has been associated with multiple intermittent episodes of N/V.  Describes the abd pain as "like my last bowel blockage." Last BM yesterday normal per pt.  Denies diarrhea, no fevers, no back pain, no rash, no CP/SOB, no black or blood in stools or emesis.       Past Medical History  Diagnosis Date  . Hyperlipidemia   . Small bowel problem     HAD ALOT OF SCAR TISSUE FROM PREVIOUS SURGERIES..NG WAS INSERTED ...Marland KitchenMarland KitchenNO SURGERY NEEDED...Marland KitchenMarland KitchenIN FOR 8 DAYS  . Disorder of blood     BEEN TREATED BY DERMATOLOGIST X 4 YRS..."BLOOD BLISTERS"  . Arthritis   . Gout   . Anxiety   . Hx MRSA infection     rt shoulder  . Pneumonia     "I've had it 3-4 times"  . Coronary artery disease     IN 2000   STENT PLACED IN 2012 sees Dr. Dietrich Pates, saw last 2013  . HTN (hypertension)     sees [name] in [place]  . Small bowel obstruction   . Ventral hernia    Past Surgical History  Procedure Laterality Date  . Sternal surg  2000    HAS HAD 5-6 ON HIS STERNUM; "caught MRSA in it"  . Lumbar laminectomy/decompression microdiscectomy  06/13/2011    Procedure: LUMBAR LAMINECTOMY/DECOMPRESSION MICRODISCECTOMY;  Surgeon: Karn Cassis;  Location: [facility];  Service: Neurosurgery;  Laterality: N/A;  Lumbar three, lumbar four-five Laminectomy  . Eye surgery      bilateral cataract  . Anterior cervical corpectomy  12/17/11  . Incision and drainage of wound  ~ 20ll; 12/03/11    "had infection in my right"  . Shoulder open rotator cuff repair  ~ 2011    right  . Peripherally inserted central catheter insertion  2011 & 11/2011  . Cataract extraction w/ intraocular lens  implant, bilateral  ? 2011  .  Coronary artery bypass graft  2000    CABG X5  . Back surgery    . Tonsillectomy  1949  . Cholecystectomy  2006 "or after"  . Coronary angioplasty with stent placement  2012  . Anterior cervical corpectomy  12/17/2011    Procedure: ANTERIOR CERVICAL CORPECTOMY;  Surgeon: Karn Cassis, MD;  Location: [facility];  Service: Neurosurgery;  Laterality: N/A;  Anterior Cervical Decompression Fusion Five to Thoracic Two with plating   Family History  Problem Relation Age of Onset  . Heart disease    . Hypertension Mother   . Hypertension Maternal Aunt   . Hypertension Maternal Uncle   . Anesthesia problems Neg Hx   . Hypotension Neg Hx   . Malignant hyperthermia Neg Hx   . Pseudochol deficiency Neg Hx    History  Substance Use Topics  . Smoking status: Former Smoker -- 1.00 packs/day for 1 years    Types: Cigarettes    Quit date: 12/27/1998  . Smokeless tobacco: Never Used  . Alcohol Use: No    Review of Systems ROS: Statement: All systems negative except as marked or noted in the HPI; Constitutional: Negative for fever and chills. ; ;  Eyes: Negative for eye pain, redness and discharge. ; ; ENMT: Negative for ear pain, hoarseness, nasal congestion, sinus pressure and sore throat. ; ; Cardiovascular: Negative for chest pain, palpitations, diaphoresis, dyspnea and peripheral edema. ; ; Respiratory: Negative for cough, wheezing and stridor. ; ; Gastrointestinal: +N/V, abd pain. Negative for diarrhea, blood in stool, hematemesis, jaundice and rectal bleeding. . ; ; Genitourinary: Negative for dysuria, flank pain and hematuria. ; ; Musculoskeletal: Negative for back pain and neck pain. Negative for swelling and trauma.; ; Skin: Negative for pruritus, rash, abrasions, blisters, bruising and skin lesion.; ; Neuro: Negative for headache, lightheadedness and neck stiffness. Negative for weakness, altered level of consciousness , altered mental status, extremity weakness, paresthesias, involuntary  movement, seizure and syncope.       Allergies  Morphine and Metoprolol  Home Medications   Current Outpatient Rx  Name  Route  Sig  Dispense  Refill  . atorvastatin (LIPITOR) 20 MG tablet   Oral   Take 20 mg by mouth at bedtime.          Marland Kitchen oxyCODONE (ROXICODONE) 15 MG immediate release tablet   Oral   Take 7.5 mg by mouth every 4 (four) hours as needed. For pain         . predniSONE (DELTASONE) 10 MG tablet   Oral   Take 10 mg by mouth daily.         . vitamin C (ASCORBIC ACID) 500 MG tablet   Oral   Take 500 mg by mouth daily.         Marland Kitchen VITAMIN D, CHOLECALCIFEROL, PO   Oral   Take 2-3 tablets by mouth daily.         Marland Kitchen VITAMIN E PO   Oral   Take 2-3 tablets by mouth daily.         . citalopram (CELEXA) 40 MG tablet   Oral   Take 40 mg by mouth daily.         Marland Kitchen doxazosin (CARDURA) 4 MG tablet   Oral   Take 8 mg by mouth at bedtime.         . nitroGLYCERIN (NITROSTAT) 0.4 MG SL tablet   Sublingual   Place 0.4 mg under the tongue every 5 (five) minutes as needed. For chest pain          BP 128/63  Pulse 68  Temp(Src) 98.3 F (36.8 C) (Oral)  Resp 20  Ht 6' (1.829 m)  Wt 200 lb (90.719 kg)  BMI 27.12 kg/m2  SpO2 100% Physical Exam 0320: Physical examination:  Nursing notes reviewed; Vital signs and O2 SAT reviewed;  Constitutional: Well developed, Well nourished, Well hydrated, Uncomfortable appearing; Head:  Normocephalic, atraumatic; Eyes: EOMI, PERRL, No scleral icterus; ENMT: Mouth and pharynx normal, Mucous membranes moist; Neck: Supple, Full range of motion, No lymphadenopathy; Cardiovascular: Regular rate and rhythm, No gallop; Respiratory: Breath sounds clear & equal bilaterally, No rales, rhonchi, wheezes.  Speaking full sentences with ease, Normal respiratory effort/excursion; Chest: Nontender, Movement normal; Abdomen: Soft, +diffusely tender to palp. +ventral hernia easily reducible. Nondistended, Decreased bowel sounds;  Genitourinary: No CVA tenderness; Extremities: Pulses normal, No tenderness, No edema, No calf edema or asymmetry.; Neuro: AA&Ox3, Major CN grossly intact.  Speech clear. Climbs on and off stretcher easily by himself. Gait steady. No gross focal motor or sensory deficits in extremities.; Skin: Color normal, Warm, Dry.   ED Course  Procedures     MDM  MDM Reviewed: previous  chart, nursing note and vitals Reviewed previous: labs and ECG Interpretation: labs, ECG, x-ray and CT scan    Date: 04/17/2013  Rate: 66  Rhythm: normal sinus rhythm and premature ventricular contractions (PVC)  QRS Axis: normal  Intervals: normal  ST/T Wave abnormalities: normal  Conduction Disutrbances:none  Narrative Interpretation:   Old EKG Reviewed: unchanged; no significant changes from previous EKG dated 01/08/2012.  Results for orders placed during the hospital encounter of 04/17/13  URINALYSIS W MICROSCOPIC + REFLEX CULTURE      Result Value Range   Color, Urine YELLOW  YELLOW   APPearance CLEAR  CLEAR   Specific Gravity, Urine 1.020  1.005 - 1.030   pH 6.0  5.0 - 8.0   Glucose, UA NEGATIVE  NEGATIVE mg/dL   Hgb urine dipstick NEGATIVE  NEGATIVE   Bilirubin Urine NEGATIVE  NEGATIVE   Ketones, ur NEGATIVE  NEGATIVE mg/dL   Protein, ur NEGATIVE  NEGATIVE mg/dL   Urobilinogen, UA 0.2  0.0 - 1.0 mg/dL   Nitrite NEGATIVE  NEGATIVE   Leukocytes, UA NEGATIVE  NEGATIVE   RBC / HPF 0-2  <3 RBC/hpf   Bacteria, UA RARE  RARE   Squamous Epithelial / LPF RARE  RARE  CBC WITH DIFFERENTIAL      Result Value Range   WBC 9.5  4.0 - 10.5 K/uL   RBC 4.17 (*) 4.22 - 5.81 MIL/uL   Hemoglobin 12.4 (*) 13.0 - 17.0 g/dL   HCT 29.5 (*) 62.1 - 30.8 %   MCV 89.2  78.0 - 100.0 fL   MCH 29.7  26.0 - 34.0 pg   MCHC 33.3  30.0 - 36.0 g/dL   RDW 65.7  84.6 - 96.2 %   Platelets 198  150 - 400 K/uL   Neutrophils Relative % 75  43 - 77 %   Neutro Abs 7.1  1.7 - 7.7 K/uL   Lymphocytes Relative 12  12 - 46 %   Lymphs  Abs 1.2  0.7 - 4.0 K/uL   Monocytes Relative 11  3 - 12 %   Monocytes Absolute 1.0  0.1 - 1.0 K/uL   Eosinophils Relative 2  0 - 5 %   Eosinophils Absolute 0.1  0.0 - 0.7 K/uL   Basophils Relative 0  0 - 1 %   Basophils Absolute 0.0  0.0 - 0.1 K/uL  COMPREHENSIVE METABOLIC PANEL      Result Value Range   Sodium 135  135 - 145 mEq/L   Potassium 3.9  3.5 - 5.1 mEq/L   Chloride 98  96 - 112 mEq/L   CO2 30  19 - 32 mEq/L   Glucose, Bld 126 (*) 70 - 99 mg/dL   BUN 25 (*) 6 - 23 mg/dL   Creatinine, Ser 9.52 (*) 0.50 - 1.35 mg/dL   Calcium 9.4  8.4 - 84.1 mg/dL   Total Protein 5.7 (*) 6.0 - 8.3 g/dL   Albumin 3.4 (*) 3.5 - 5.2 g/dL   AST 15  0 - 37 U/L   ALT 15  0 - 53 U/L   Alkaline Phosphatase 64  39 - 117 U/L   Total Bilirubin 0.5  0.3 - 1.2 mg/dL   GFR calc non Af Amer 39 (*) >90 mL/min   GFR calc Af Amer 45 (*) >90 mL/min  LIPASE, BLOOD      Result Value Range   Lipase 26  11 - 59 U/L  TROPONIN I      Result Value  Range   Troponin I <0.30  <0.30 ng/mL  LACTIC ACID, PLASMA      Result Value Range   Lactic Acid, Venous 1.3  0.5 - 2.2 mmol/L   Ct Abdomen Pelvis Wo Contrast 04/17/2013   CLINICAL DATA:  Abdominal pain  EXAM: CT ABDOMEN AND PELVIS WITHOUT CONTRAST  TECHNIQUE: Multidetector CT imaging of the abdomen and pelvis was performed following the standard protocol without intravenous contrast.  COMPARISON:  07/16/2011  FINDINGS: Calcified granuloma in the superior segment right lower lobe with a calcified right infrahilar lymph node. Remainder of lung bases clear. Patchy coronary and aortoiliac arterial calcifications. Multiple small calcified granulomas in the spleen and liver. Surgical clips in the gallbladder fossa. Small hiatal hernia. Stomach distended by ingested material. Incomplete distal passage of the oral contrast material. Previous ventral hernia mesh repair. The proximal small bowel is decompressed. There is fluid distention of multiple mid small bowel loops with  transition to decompressed distal small bowel in the right lower quadrant, with associated small bowel feces sign. No discrete mass or abscess as source of the distal small bowel obstruction. The colon is nondilated. Urinary bladder is physiologically distended. Moderate prostatic enlargement with central coarse calcifications. No ascites. No free air. No adenopathy localized. Changes of lumbar decompression L2-3, L3-4, PLIF L4-S1. Spondylitic changes in the visualized lower thoracic and lumbar spine.  IMPRESSION: 1. Mid/distal small bowel obstruction with transition point in the right lower quadrant, no evident etiology, suggesting adhesions as most likely source.   Electronically Signed   By: Oley Balm M.D.   On: 04/17/2013 05:43   Dg Abd Acute W/chest 04/17/2013   CLINICAL DATA:  Abdominal pain, nausea.  EXAM: ACUTE ABDOMEN SERIES (ABDOMEN 2 VIEW & CHEST 1 VIEW)  COMPARISON:  01/08/2012  FINDINGS: Chronic blunting of costophrenic angles. Previous CABG. Dense probably calcified right lower lung nodule and right infrahilar lymph nodes. Vascular clips project over the mediastinum, and in the left axilla. Orthopedic anchors project over the right humeral head. Cervical fixation hardware noted. Heart size normal. Lungs remain clear.  No free air on the erect film. A few gas distended mid abdominal small bowel loops with fluid levels on the erect film. Moderate fecal material in the proximal colon, decompressed distally . Fixation hardware in the lower lumbar spine. Innumerable small splenic calcified granulomas. Laparoscopic ventral hernia mesh sutures. Spondylitic changes in the lumbar spine.  IMPRESSION: 1. Distended mid abdominal small bowel loops with fluid levels suggesting early ileus versusearly/partial mid small bowel obstruction. 2. No free air. 3. Thoracic postoperative changes without acute abnormality.   Electronically Signed   By: Oley Balm M.D.   On: 04/17/2013 04:47   Results for  GEORGI, NAVARRETE (MRN [id number]) as of 04/17/2013 06:18  Ref. Range 08/26/2012 14:33 08/31/2012 12:31 04/17/2013 03:43  BUN Latest Range: 6-23 mg/dL 21 15 25  (H)  Creatinine Latest Range: 0.50-1.35 mg/dL 5.62 (H) 1.30 8.65 (H)    0605:  Pt continues uncomfortable after multiple doses of IV fentanyl. Will dose dilaudid, as pt states he "acts crazy" when given morphine and Cr is elevated. Denies any further N/V after IV zofran. BUN/Cr elevated from previous. Dx and testing d/w pt.  Questions answered.  Verb understanding, agreeable to admit.  T/C to [facility] Dr. Karilyn Cota, case discussed, including:  HPI, pertinent PM/SHx, VS/PE, dx testing, ED course and treatment:  Agreeable to admit, requests to write temporary orders, obtain medical bed to [name]' service.  Laray Anger, DO 04/18/13 1255

## 2013-04-17 NOTE — Consult Note (Signed)
Reason for Consult: Small bowel obstruction Referring Physician: [facility], Dr. Dorathy Kinsman is an 72 y.o. male.  HPI: Patient is a 72 year old white male who presents with a 24-hour history of worsening abdominal distention. He states his last bowel movement was normal 2 days ago. He felt so uncomfortable, that he made himself throw up. He has had multiple abdominal surgeries in the past, one of which was an exploratory laparotomy in the remote past for bowel obstruction. He has had other treatments with NG tube decompression, which has resolved his obstruction. He does have some abdominal distention currently, no NG tube is in place.  Past Medical History  Diagnosis Date  . Hyperlipidemia   . Small bowel problem     HAD ALOT OF SCAR TISSUE FROM PREVIOUS SURGERIES..NG WAS INSERTED ...Marland KitchenMarland KitchenNO SURGERY NEEDED...Marland KitchenMarland KitchenIN FOR 8 DAYS  . Disorder of blood     BEEN TREATED BY DERMATOLOGIST X 4 YRS..."BLOOD BLISTERS"  . Arthritis   . Gout   . Anxiety   . Hx MRSA infection     rt shoulder  . Pneumonia     "I've had it 3-4 times"  . Coronary artery disease     IN 2000   STENT PLACED IN 2012 sees Dr. Dietrich Pates, saw last 2013  . HTN (hypertension)     sees [name] in [place]  . Small bowel obstruction   . Ventral hernia   . Acute renal failure 04/17/2013    Past Surgical History  Procedure Laterality Date  . Sternal surg  2000    HAS HAD 5-6 ON HIS STERNUM; "caught MRSA in it"  . Lumbar laminectomy/decompression microdiscectomy  06/13/2011    Procedure: LUMBAR LAMINECTOMY/DECOMPRESSION MICRODISCECTOMY;  Surgeon: Karn Cassis;  Location: [facility];  Service: Neurosurgery;  Laterality: N/A;  Lumbar three, lumbar four-five Laminectomy  . Eye surgery      bilateral cataract  . Anterior cervical corpectomy  12/17/11  . Incision and drainage of wound  ~ 20ll; 12/03/11    "had infection in my right"  . Shoulder open rotator cuff repair  ~ 2011    right  . Peripherally  inserted central catheter insertion  2011 & 11/2011  . Cataract extraction w/ intraocular lens  implant, bilateral  ? 2011  . Coronary artery bypass graft  2000    CABG X5  . Back surgery      lumbar  . Tonsillectomy  1949  . Cholecystectomy  2006 "or after"  . Coronary angioplasty with stent placement  2012  . Anterior cervical corpectomy  12/17/2011    Procedure: ANTERIOR CERVICAL CORPECTOMY;  Surgeon: Karn Cassis, MD;  Location: [facility];  Service: Neurosurgery;  Laterality: N/A;  Anterior Cervical Decompression Fusion Five to Thoracic Two with plating    Family History  Problem Relation Age of Onset  . Heart disease    . Hypertension Mother   . Hypertension Maternal Aunt   . Hypertension Maternal Uncle   . Anesthesia problems Neg Hx   . Hypotension Neg Hx   . Malignant hyperthermia Neg Hx   . Pseudochol deficiency Neg Hx     Social History:  reports that he quit smoking about 14 years ago. His smoking use included Cigarettes. He has a 1 pack-year smoking history. He has never used smokeless tobacco. He reports that he does not drink alcohol or use illicit drugs.  Allergies:  Allergies  Allergen Reactions  . Morphine Other (See Comments)  REACTION: made him go crazy; "I had fun w/it; my family didn't think it was too funny"  . Metoprolol Other (See Comments)    REACTION:  Tachycardia; "don't remember how bad it was; it was so long ago"    Medications: I have reviewed the patient's current medications.  Results for orders placed during the hospital encounter of 04/17/13 (from the past 48 hour(s))  CBC WITH DIFFERENTIAL     Status: Abnormal   Collection Time    04/17/13  3:43 AM      Result Value Range   WBC 9.5  4.0 - 10.5 K/uL   RBC 4.17 (*) 4.22 - 5.81 MIL/uL   Hemoglobin 12.4 (*) 13.0 - 17.0 g/dL   HCT 16.1 (*) 09.6 - 04.5 %   MCV 89.2  78.0 - 100.0 fL   MCH 29.7  26.0 - 34.0 pg   MCHC 33.3  30.0 - 36.0 g/dL   RDW 40.9  81.1 - 91.4 %   Platelets 198   150 - 400 K/uL   Neutrophils Relative % 75  43 - 77 %   Neutro Abs 7.1  1.7 - 7.7 K/uL   Lymphocytes Relative 12  12 - 46 %   Lymphs Abs 1.2  0.7 - 4.0 K/uL   Monocytes Relative 11  3 - 12 %   Monocytes Absolute 1.0  0.1 - 1.0 K/uL   Eosinophils Relative 2  0 - 5 %   Eosinophils Absolute 0.1  0.0 - 0.7 K/uL   Basophils Relative 0  0 - 1 %   Basophils Absolute 0.0  0.0 - 0.1 K/uL  COMPREHENSIVE METABOLIC PANEL     Status: Abnormal   Collection Time    04/17/13  3:43 AM      Result Value Range   Sodium 135  135 - 145 mEq/L   Potassium 3.9  3.5 - 5.1 mEq/L   Chloride 98  96 - 112 mEq/L   CO2 30  19 - 32 mEq/L   Glucose, Bld 126 (*) 70 - 99 mg/dL   BUN 25 (*) 6 - 23 mg/dL   Creatinine, Ser 7.82 (*) 0.50 - 1.35 mg/dL   Calcium 9.4  8.4 - 95.6 mg/dL   Total Protein 5.7 (*) 6.0 - 8.3 g/dL   Albumin 3.4 (*) 3.5 - 5.2 g/dL   AST 15  0 - 37 U/L   ALT 15  0 - 53 U/L   Alkaline Phosphatase 64  39 - 117 U/L   Total Bilirubin 0.5  0.3 - 1.2 mg/dL   GFR calc non Af Amer 39 (*) >90 mL/min   GFR calc Af Amer 45 (*) >90 mL/min   Comment: (NOTE)     The eGFR has been calculated using the CKD EPI equation.     This calculation has not been validated in all clinical situations.     eGFR's persistently <90 mL/min signify possible Chronic Kidney     Disease.  LIPASE, BLOOD     Status: None   Collection Time    04/17/13  3:43 AM      Result Value Range   Lipase 26  11 - 59 U/L  TROPONIN I     Status: None   Collection Time    04/17/13  3:43 AM      Result Value Range   Troponin I <0.30  <0.30 ng/mL   Comment:            Due to the release  kinetics of cTnI,     a negative result within the first hours     of the onset of symptoms does not rule out     myocardial infarction with certainty.     If myocardial infarction is still suspected,     repeat the test at appropriate intervals.  LACTIC ACID, PLASMA     Status: None   Collection Time    04/17/13  3:43 AM      Result Value Range    Lactic Acid, Venous 1.3  0.5 - 2.2 mmol/L  URINALYSIS W MICROSCOPIC + REFLEX CULTURE     Status: None   Collection Time    04/17/13  5:52 AM      Result Value Range   Color, Urine YELLOW  YELLOW   APPearance CLEAR  CLEAR   Specific Gravity, Urine 1.020  1.005 - 1.030   pH 6.0  5.0 - 8.0   Glucose, UA NEGATIVE  NEGATIVE mg/dL   Hgb urine dipstick NEGATIVE  NEGATIVE   Bilirubin Urine NEGATIVE  NEGATIVE   Ketones, ur NEGATIVE  NEGATIVE mg/dL   Protein, ur NEGATIVE  NEGATIVE mg/dL   Urobilinogen, UA 0.2  0.0 - 1.0 mg/dL   Nitrite NEGATIVE  NEGATIVE   Leukocytes, UA NEGATIVE  NEGATIVE   RBC / HPF 0-2  <3 RBC/hpf   Bacteria, UA RARE  RARE   Squamous Epithelial / LPF RARE  RARE    Ct Abdomen Pelvis Wo Contrast  04/17/2013   CLINICAL DATA:  Abdominal pain  EXAM: CT ABDOMEN AND PELVIS WITHOUT CONTRAST  TECHNIQUE: Multidetector CT imaging of the abdomen and pelvis was performed following the standard protocol without intravenous contrast.  COMPARISON:  07/16/2011  FINDINGS: Calcified granuloma in the superior segment right lower lobe with a calcified right infrahilar lymph node. Remainder of lung bases clear. Patchy coronary and aortoiliac arterial calcifications. Multiple small calcified granulomas in the spleen and liver. Surgical clips in the gallbladder fossa. Small hiatal hernia. Stomach distended by ingested material. Incomplete distal passage of the oral contrast material. Previous ventral hernia mesh repair. The proximal small bowel is decompressed. There is fluid distention of multiple mid small bowel loops with transition to decompressed distal small bowel in the right lower quadrant, with associated small bowel feces sign. No discrete mass or abscess as source of the distal small bowel obstruction. The colon is nondilated. Urinary bladder is physiologically distended. Moderate prostatic enlargement with central coarse calcifications. No ascites. No free air. No adenopathy localized. Changes  of lumbar decompression L2-3, L3-4, PLIF L4-S1. Spondylitic changes in the visualized lower thoracic and lumbar spine.  IMPRESSION: 1. Mid/distal small bowel obstruction with transition point in the right lower quadrant, no evident etiology, suggesting adhesions as most likely source.   Electronically Signed   By: Oley Balm M.D.   On: 04/17/2013 05:43   Dg Abd Acute W/chest  04/17/2013   CLINICAL DATA:  Abdominal pain, nausea.  EXAM: ACUTE ABDOMEN SERIES (ABDOMEN 2 VIEW & CHEST 1 VIEW)  COMPARISON:  01/08/2012  FINDINGS: Chronic blunting of costophrenic angles. Previous CABG. Dense probably calcified right lower lung nodule and right infrahilar lymph nodes. Vascular clips project over the mediastinum, and in the left axilla. Orthopedic anchors project over the right humeral head. Cervical fixation hardware noted. Heart size normal. Lungs remain clear.  No free air on the erect film. A few gas distended mid abdominal small bowel loops with fluid levels on the erect film. Moderate fecal material in the  proximal colon, decompressed distally . Fixation hardware in the lower lumbar spine. Innumerable small splenic calcified granulomas. Laparoscopic ventral hernia mesh sutures. Spondylitic changes in the lumbar spine.  IMPRESSION: 1. Distended mid abdominal small bowel loops with fluid levels suggesting early ileus versusearly/partial mid small bowel obstruction. 2. No free air. 3. Thoracic postoperative changes without acute abnormality.   Electronically Signed   By: Oley Balm M.D.   On: 04/17/2013 04:47    ROS: See chart Blood pressure 116/67, pulse 59, temperature 97.9 F (36.6 C), temperature source Oral, resp. rate 20, height 6' (1.829 m), weight 90.719 kg (200 lb), SpO2 99.00%. Physical Exam: Pleasant white male in no acute distress. Abdomen: Soft, but distended. No rigidity noted. No obvious hernias noted. Multiple surgical scars present.  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 No need for acute surgical intervention. Plan: We'll have NG tube placed. We'll treat conservatively at this point. Patient understands and agrees to the treatment plan.  Junie Avilla A 04/17/2013, 10:29 AM

## 2013-04-17 NOTE — ED Notes (Signed)
Pt states he has a knot in his intestines, that he has had it before, pt states vomited earlier tonight.

## 2013-04-18 LAB — BASIC METABOLIC PANEL
CO2: 30 mEq/L (ref 19–32)
Calcium: 9.3 mg/dL (ref 8.4–10.5)
Chloride: 98 mEq/L (ref 96–112)
Creatinine, Ser: 1.32 mg/dL (ref 0.50–1.35)
GFR calc Af Amer: 61 mL/min — ABNORMAL LOW (ref >90)
Potassium: 4.3 mEq/L (ref 3.5–5.1)
Sodium: 134 mEq/L — ABNORMAL LOW (ref 135–145)

## 2013-04-18 LAB — CBC
HCT: 39.4 % (ref 39.0–52.0)
Hemoglobin: 13.2 g/dL (ref 13.0–17.0)
MCV: 89.3 fL (ref 78.0–100.0)
RBC: 4.41 MIL/uL (ref 4.22–5.81)
RDW: 15.2 % (ref 11.5–15.5)
WBC: 10.5 10*3/uL (ref 4.0–10.5)

## 2013-04-18 MED ORDER — ANTIPYRINE-BENZOCAINE 5.4-1.4 % OT SOLN
4.0000 [drp] | Freq: Four times a day (QID) | OTIC | Status: DC
  Administered 2013-04-18 (×3): 4 [drp] via OTIC
  Filled 2013-04-18: qty 10

## 2013-04-18 MED ORDER — PHENOL 1.4 % MT LIQD
1.0000 | OROMUCOSAL | Status: DC | PRN
  Administered 2013-04-18: 1 via OROMUCOSAL
  Filled 2013-04-18: qty 177

## 2013-04-18 NOTE — Progress Notes (Signed)
Patient up to bedside commode with NT.  NG tube came of out of right nare.  Patient has had bowel movement and is passing gas.  Dr. Lovell Sheehan notified via text page.

## 2013-04-18 NOTE — Care Management Note (Signed)
    Page 1 of 1   04/18/2013     1:31:05 PM   CARE MANAGEMENT NOTE 04/18/2013  Patient:  [name], [name]   Account Number:  [id number]  Date Initiated:  04/18/2013  Documentation initiated by:  Rosemary Holms  Subjective/Objective Assessment:   Pt and wife in rooom. Pt lives at home iwth wife. Reviewed HH and DME. No needs identified or anticipated     Action/Plan:   Anticipated DC Date:  04/20/2013   Anticipated DC Plan:  HOME/SELF CARE      DC Planning Services  CM consult      Choice offered to / List presented to:             Status of service:  In process, will continue to follow Medicare Important Message given?   (If response is "NO", the following Medicare IM given date fields will be blank) Date Medicare IM given:   Date Additional Medicare IM given:    Discharge Disposition:    Per UR Regulation:    If discussed at Long Length of Stay Meetings, dates discussed:    Comments:  04/18/13 Rosemary Holms RN [facility] CM

## 2013-04-18 NOTE — Progress Notes (Signed)
1200 - Patient c/o of left ear pain.  [name]' office called.  Not available at this time.  1310 - Pt stated that pain medication helped the left ear also but still aching.  [name] office called and stated that he was out of office at meeting.

## 2013-04-18 NOTE — Progress Notes (Signed)
Patient c/o of left ear pain/ache.  [name] paged.  Returned page and gave order for patient to receive Auralgan ear drops four drops in left ear four times a day.  Orders followed.

## 2013-04-18 NOTE — Progress Notes (Signed)
Subjective: He was admitted with small bowel obstruction. He has a nasogastric tube in place and has drained about 500 cc of brownish material. He still has some abdominal pain. He has less nausea.  Objective: Vital signs in last 24 hours: Temp:  [98.1 F (36.7 C)-98.6 F (37 C)] 98.1 F (36.7 C) (09/22 0534) Pulse Rate:  [60-72] 72 (09/22 0534) Resp:  [20] 20 (09/22 0534) BP: (99-128)/(51-68) 99/51 mmHg (09/22 0534) SpO2:  [95 %-100 %] 95 % (09/22 0534) Weight change:  Last BM Date: 04/15/13  Intake/Output from previous day: 09/21 0701 - 09/22 0700 In: 2556.7 [I.V.:2556.7] Out: 700 [Urine:500; Emesis/NG output:200]  PHYSICAL EXAM General appearance: alert, cooperative and mild distress Resp: clear to auscultation bilaterally Cardio: regular rate and rhythm, S1, S2 normal, no murmur, click, rub or gallop GI: He is still tender diffusely with hyperactive bowel Extremities: extremities normal, atraumatic, no cyanosis or edema  Lab Results:    Basic Metabolic Panel:  Recent Labs  16/10/96 0343 04/18/13 0511  NA 135 134*  K 3.9 4.3  CL 98 98  CO2 30 30  GLUCOSE 126* 150*  BUN 25* 22  CREATININE 1.72* 1.32  CALCIUM 9.4 9.3   Liver Function Tests:  Recent Labs  04/17/13 0343  AST 15  ALT 15  ALKPHOS 64  BILITOT 0.5  PROT 5.7*  ALBUMIN 3.4*    Recent Labs  04/17/13 0343  LIPASE 26   No results found for this basename: AMMONIA,  in the last 72 hours CBC:  Recent Labs  04/17/13 0343 04/18/13 0511  WBC 9.5 10.5  NEUTROABS 7.1  --   HGB 12.4* 13.2  HCT 37.2* 39.4  MCV 89.2 89.3  PLT 198 219   Cardiac Enzymes:  Recent Labs  04/17/13 0343  TROPONINI <0.30   BNP: No results found for this basename: PROBNP,  in the last 72 hours D-Dimer: No results found for this basename: DDIMER,  in the last 72 hours CBG: No results found for this basename: GLUCAP,  in the last 72 hours Hemoglobin A1C: No results found for this basename: HGBA1C,  in the  last 72 hours Fasting Lipid Panel: No results found for this basename: CHOL, HDL, LDLCALC, TRIG, CHOLHDL, LDLDIRECT,  in the last 72 hours Thyroid Function Tests: No results found for this basename: TSH, T4TOTAL, FREET4, T3FREE, THYROIDAB,  in the last 72 hours Anemia Panel: No results found for this basename: VITAMINB12, FOLATE, FERRITIN, TIBC, IRON, RETICCTPCT,  in the last 72 hours Coagulation: No results found for this basename: LABPROT, INR,  in the last 72 hours Urine Drug Screen: Drugs of Abuse  No results found for this basename: labopia, cocainscrnur, labbenz, amphetmu, thcu, labbarb    Alcohol Level: No results found for this basename: ETH,  in the last 72 hours Urinalysis:  Recent Labs  04/17/13 0552  COLORURINE YELLOW  LABSPEC 1.020  PHURINE 6.0  GLUCOSEU NEGATIVE  HGBUR NEGATIVE  BILIRUBINUR NEGATIVE  KETONESUR NEGATIVE  PROTEINUR NEGATIVE  UROBILINOGEN 0.2  NITRITE NEGATIVE  LEUKOCYTESUR NEGATIVE   Misc. Labs:  ABGS No results found for this basename: PHART, PCO2, PO2ART, TCO2, HCO3,  in the last 72 hours CULTURES No results found for this or any previous visit (from the past 240 hour(s)). Studies/Results: Ct Abdomen Pelvis Wo Contrast  04/17/2013   CLINICAL DATA:  Abdominal pain  EXAM: CT ABDOMEN AND PELVIS WITHOUT CONTRAST  TECHNIQUE: Multidetector CT imaging of the abdomen and pelvis was performed following the standard protocol without intravenous  contrast.  COMPARISON:  07/16/2011  FINDINGS: Calcified granuloma in the superior segment right lower lobe with a calcified right infrahilar lymph node. Remainder of lung bases clear. Patchy coronary and aortoiliac arterial calcifications. Multiple small calcified granulomas in the spleen and liver. Surgical clips in the gallbladder fossa. Small hiatal hernia. Stomach distended by ingested material. Incomplete distal passage of the oral contrast material. Previous ventral hernia mesh repair. The proximal small bowel  is decompressed. There is fluid distention of multiple mid small bowel loops with transition to decompressed distal small bowel in the right lower quadrant, with associated small bowel feces sign. No discrete mass or abscess as source of the distal small bowel obstruction. The colon is nondilated. Urinary bladder is physiologically distended. Moderate prostatic enlargement with central coarse calcifications. No ascites. No free air. No adenopathy localized. Changes of lumbar decompression L2-3, L3-4, PLIF L4-S1. Spondylitic changes in the visualized lower thoracic and lumbar spine.  IMPRESSION: 1. Mid/distal small bowel obstruction with transition point in the right lower quadrant, no evident etiology, suggesting adhesions as most likely source.   Electronically Signed   By: Oley Balm M.D.   On: 04/17/2013 05:43   Dg Abd Acute W/chest  04/17/2013   CLINICAL DATA:  Abdominal pain, nausea.  EXAM: ACUTE ABDOMEN SERIES (ABDOMEN 2 VIEW & CHEST 1 VIEW)  COMPARISON:  01/08/2012  FINDINGS: Chronic blunting of costophrenic angles. Previous CABG. Dense probably calcified right lower lung nodule and right infrahilar lymph nodes. Vascular clips project over the mediastinum, and in the left axilla. Orthopedic anchors project over the right humeral head. Cervical fixation hardware noted. Heart size normal. Lungs remain clear.  No free air on the erect film. A few gas distended mid abdominal small bowel loops with fluid levels on the erect film. Moderate fecal material in the proximal colon, decompressed distally . Fixation hardware in the lower lumbar spine. Innumerable small splenic calcified granulomas. Laparoscopic ventral hernia mesh sutures. Spondylitic changes in the lumbar spine.  IMPRESSION: 1. Distended mid abdominal small bowel loops with fluid levels suggesting early ileus versusearly/partial mid small bowel obstruction. 2. No free air. 3. Thoracic postoperative changes without acute abnormality.    Electronically Signed   By: Oley Balm M.D.   On: 04/17/2013 04:47    Medications:  Prior to Admission:  Prescriptions prior to admission  Medication Sig Dispense Refill  . atorvastatin (LIPITOR) 20 MG tablet Take 20 mg by mouth at bedtime.       . citalopram (CELEXA) 40 MG tablet Take 40 mg by mouth daily.      . Cyanocobalamin (VITAMIN B 12 PO) Take 1 tablet by mouth daily.      Marland Kitchen doxazosin (CARDURA) 4 MG tablet Take 8 mg by mouth at bedtime.      . nitroGLYCERIN (NITROSTAT) 0.4 MG SL tablet Place 0.4 mg under the tongue every 5 (five) minutes as needed. For chest pain      . oxyCODONE (ROXICODONE) 15 MG immediate release tablet Take 7.5 mg by mouth every 4 (four) hours as needed. For pain      . predniSONE (DELTASONE) 10 MG tablet Take 10 mg by mouth daily.      . vitamin C (ASCORBIC ACID) 500 MG tablet Take 500 mg by mouth daily.      Marland Kitchen VITAMIN D, CHOLECALCIFEROL, PO Take 2-3 tablets by mouth daily.      Marland Kitchen VITAMIN E PO Take 2-3 tablets by mouth daily.       Scheduled: .  citalopram  40 mg Oral Daily  . doxazosin  8 mg Oral QHS  . influenza vac split quadrivalent PF  0.5 mL Intramuscular Tomorrow-1000  . predniSONE  10 mg Oral Daily   Continuous: . dextrose 5 % and 0.45 % NaCl with KCl 20 mEq/L 100 mL/hr at 04/18/13 1610   RUE:[id number], acetaminophen, HYDROmorphone (DILAUDID) injection, nitroGLYCERIN, ondansetron (ZOFRAN) IV, ondansetron  Assesment: He is admitted with small bowel obstruction. He has multiple other medical problems and seems fairly stable now. Principal Problem:   Small bowel obstruction Active Problems:   CAD in native artery   Acute renal failure    Plan: Continue NG drainage.    LOS: 1 day   Brinn Westby L 04/18/2013, 8:52 AM

## 2013-04-18 NOTE — Progress Notes (Signed)
UR Chart Review Completed  

## 2013-04-18 NOTE — Progress Notes (Signed)
1550 - Dr. Lovell Sheehan returned page and gave order for patient to receive Clear Liquid Diet and leave NG tube out.  Orders followed.

## 2013-04-18 NOTE — Progress Notes (Signed)
Patient has NG tube today.  RN called [name] to verify route for Celexa and Prednisone.  [name] gave order to hold today's dose instead of giving per tube.  Patient c/o throat irritation.  [name] notified and gave order for patient to receive Chloraseptic spray as needed.

## 2013-04-18 NOTE — Progress Notes (Signed)
Subjective: Patient is comfortable, denies any abdominal pain. No flatus or bowel movements yet.  Objective: Vital signs in last 24 hours: Temp:  [98.1 F (36.7 C)-98.6 F (37 C)] 98.1 F (36.7 C) (09/22 0534) Pulse Rate:  [60-72] 72 (09/22 0534) Resp:  [20] 20 (09/22 0534) BP: (99-128)/(51-68) 99/51 mmHg (09/22 0534) SpO2:  [95 %-100 %] 95 % (09/22 0534) Last BM Date: 04/15/13  Intake/Output from previous day: 09/21 0701 - 09/22 0700 In: 2556.7 [I.V.:2556.7] Out: 700 [Urine:500; Emesis/NG output:200] Intake/Output this shift:    General appearance: alert, cooperative and no distress GI: Soft, nontender. Incisional hernia still present in the upper abdomen. Active bowel sounds appreciated. No rigidity noted.  Lab Results:   Recent Labs  04/17/13 0343 04/18/13 0511  WBC 9.5 10.5  HGB 12.4* 13.2  HCT 37.2* 39.4  PLT 198 219   BMET  Recent Labs  04/17/13 0343 04/18/13 0511  NA 135 134*  K 3.9 4.3  CL 98 98  CO2 30 30  GLUCOSE 126* 150*  BUN 25* 22  CREATININE 1.72* 1.32  CALCIUM 9.4 9.3   PT/INR No results found for this basename: LABPROT, INR,  in the last 72 hours  Studies/Results: Ct Abdomen Pelvis Wo Contrast  04/17/2013   CLINICAL DATA:  Abdominal pain  EXAM: CT ABDOMEN AND PELVIS WITHOUT CONTRAST  TECHNIQUE: Multidetector CT imaging of the abdomen and pelvis was performed following the standard protocol without intravenous contrast.  COMPARISON:  07/16/2011  FINDINGS: Calcified granuloma in the superior segment right lower lobe with a calcified right infrahilar lymph node. Remainder of lung bases clear. Patchy coronary and aortoiliac arterial calcifications. Multiple small calcified granulomas in the spleen and liver. Surgical clips in the gallbladder fossa. Small hiatal hernia. Stomach distended by ingested material. Incomplete distal passage of the oral contrast material. Previous ventral hernia mesh repair. The proximal small bowel is decompressed.  There is fluid distention of multiple mid small bowel loops with transition to decompressed distal small bowel in the right lower quadrant, with associated small bowel feces sign. No discrete mass or abscess as source of the distal small bowel obstruction. The colon is nondilated. Urinary bladder is physiologically distended. Moderate prostatic enlargement with central coarse calcifications. No ascites. No free air. No adenopathy localized. Changes of lumbar decompression L2-3, L3-4, PLIF L4-S1. Spondylitic changes in the visualized lower thoracic and lumbar spine.  IMPRESSION: 1. Mid/distal small bowel obstruction with transition point in the right lower quadrant, no evident etiology, suggesting adhesions as most likely source.   Electronically Signed   By: Oley Balm M.D.   On: 04/17/2013 05:43   Dg Abd Acute W/chest  04/17/2013   CLINICAL DATA:  Abdominal pain, nausea.  EXAM: ACUTE ABDOMEN SERIES (ABDOMEN 2 VIEW & CHEST 1 VIEW)  COMPARISON:  01/08/2012  FINDINGS: Chronic blunting of costophrenic angles. Previous CABG. Dense probably calcified right lower lung nodule and right infrahilar lymph nodes. Vascular clips project over the mediastinum, and in the left axilla. Orthopedic anchors project over the right humeral head. Cervical fixation hardware noted. Heart size normal. Lungs remain clear.  No free air on the erect film. A few gas distended mid abdominal small bowel loops with fluid levels on the erect film. Moderate fecal material in the proximal colon, decompressed distally . Fixation hardware in the lower lumbar spine. Innumerable small splenic calcified granulomas. Laparoscopic ventral hernia mesh sutures. Spondylitic changes in the lumbar spine.  IMPRESSION: 1. Distended mid abdominal small bowel loops with fluid levels suggesting  early ileus versusearly/partial mid small bowel obstruction. 2. No free air. 3. Thoracic postoperative changes without acute abnormality.   Electronically Signed   By:  Oley Balm M.D.   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 Plan: Patient has had multiple surgeries in the past with complications. Obviously, we'll try to avoid surgical intervention at this time. Would continue conservative therapy at this time. Discussed with [name].  LOS: 1 day    [name] A 04/18/2013

## 2013-04-19 MED ORDER — ANTIPYRINE-BENZOCAINE 5.4-1.4 % OT SOLN: 4.0000 [drp] | Freq: Four times a day (QID) | OTIC | Status: DC

## 2013-04-19 NOTE — Progress Notes (Signed)
  Subjective: Patient has had multiple bowel movements since yesterday. NG tube came out on its own. He is tolerating clear liquid diet well. Minimal abdominal pain has been noted.  Objective: Vital signs in last 24 hours: Temp:  [98.2 F (36.8 C)-98.3 F (36.8 C)] 98.3 F (36.8 C) (09/23 0508) Pulse Rate:  [68-72] 72 (09/23 0508) Resp:  [20] 20 (09/23 0508) BP: (115-135)/(55-66) 135/66 mmHg (09/23 0508) SpO2:  [98 %-100 %] 98 % (09/23 0508) Last BM Date: 04/18/13  Intake/Output from previous day: 09/22 0701 - 09/23 0700 In: 1250 [I.V.:1250] Out: 600 [Urine:600] Intake/Output this shift:    General appearance: alert, cooperative and no distress GI: soft, non-tender; bowel sounds normal; no masses,  no organomegaly  Lab Results:   Recent Labs  04/17/13 0343 04/18/13 0511  WBC 9.5 10.5  HGB 12.4* 13.2  HCT 37.2* 39.4  PLT 198 219   BMET  Recent Labs  04/17/13 0343 04/18/13 0511  NA 135 134*  K 3.9 4.3  CL 98 98  CO2 30 30  GLUCOSE 126* 150*  BUN 25* 22  CREATININE 1.72* 1.32  CALCIUM 9.4 9.3   PT/INR No results found for this basename: LABPROT, INR,  in the last 72 hours  Studies/Results: No results found.  Anti-infectives: Anti-infectives   None      Assessment/Plan: Impression: Small bowel obstruction, resolving Plan: Patient would like to go home, which is fine with me. He was instructed on increasing his diet as tolerated. He does realize that this could occur again.  LOS: 2 days    Brianny Soulliere A 04/19/2013

## 2013-04-19 NOTE — Progress Notes (Signed)
AVS reviewed with patient. Patient verbalized understanding of discharge instructions and follow-up.  Pt's IV removed.  Site WNL.  Pt reports all belongings intact and in possession at discharge.  Pt transported by NT via w/c to main entrance for discharge. Pt stable at time of discharge. No c/o pain.

## 2013-04-21 NOTE — Discharge Planning (Signed)
Physician Discharge Summary  Patient ID: [name] MRN: [id number] DOB/AGE: 1941/03/02 72 y.o. Primary Care Physician:Terrace Fontanilla L, MD Admit date: 04/17/2013 Discharge date: 04/21/2013    Discharge Diagnoses:   Principal Problem:   Small bowel obstruction Active Problems:   CAD in native artery   Acute renal failure     Medication List         antipyrine-benzocaine otic solution  Commonly known as:  AURALGAN  Place 4 drops into the left ear 4 (four) times daily.     atorvastatin 20 MG tablet  Commonly known as:  LIPITOR  Take 20 mg by mouth at bedtime.     citalopram 40 MG tablet  Commonly known as:  CELEXA  Take 40 mg by mouth daily.     doxazosin 4 MG tablet  Commonly known as:  CARDURA  Take 8 mg by mouth at bedtime.     nitroGLYCERIN 0.4 MG SL tablet  Commonly known as:  NITROSTAT  Place 0.4 mg under the tongue every 5 (five) minutes as needed. For chest pain     oxyCODONE 15 MG immediate release tablet  Commonly known as:  ROXICODONE  Take 7.5 mg by mouth every 4 (four) hours as needed. For pain     predniSONE 10 MG tablet  Commonly known as:  DELTASONE  Take 10 mg by mouth daily.     VITAMIN B 12 PO  Take 1 tablet by mouth daily.     vitamin C 500 MG tablet  Commonly known as:  ASCORBIC ACID  Take 500 mg by mouth daily.     VITAMIN D (CHOLECALCIFEROL) PO  Take 2-3 tablets by mouth daily.     VITAMIN E PO  Take 2-3 tablets by mouth daily.        Discharged Condition: Improved    Consults: Gen. surgery, Dr. Lovell Sheehan  Significant Diagnostic Studies: Ct Abdomen Pelvis Wo Contrast  04/17/2013   CLINICAL DATA:  Abdominal pain  EXAM: CT ABDOMEN AND PELVIS WITHOUT CONTRAST  TECHNIQUE: Multidetector CT imaging of the abdomen and pelvis was performed following the standard protocol without intravenous contrast.  COMPARISON:  07/16/2011  FINDINGS: Calcified granuloma in the superior segment right lower lobe with a calcified right infrahilar  lymph node. Remainder of lung bases clear. Patchy coronary and aortoiliac arterial calcifications. Multiple small calcified granulomas in the spleen and liver. Surgical clips in the gallbladder fossa. Small hiatal hernia. Stomach distended by ingested material. Incomplete distal passage of the oral contrast material. Previous ventral hernia mesh repair. The proximal small bowel is decompressed. There is fluid distention of multiple mid small bowel loops with transition to decompressed distal small bowel in the right lower quadrant, with associated small bowel feces sign. No discrete mass or abscess as source of the distal small bowel obstruction. The colon is nondilated. Urinary bladder is physiologically distended. Moderate prostatic enlargement with central coarse calcifications. No ascites. No free air. No adenopathy localized. Changes of lumbar decompression L2-3, L3-4, PLIF L4-S1. Spondylitic changes in the visualized lower thoracic and lumbar spine.  IMPRESSION: 1. Mid/distal small bowel obstruction with transition point in the right lower quadrant, no evident etiology, suggesting adhesions as most likely source.   Electronically Signed   By: Oley Balm M.D.   On: 04/17/2013 05:43   Dg Abd Acute W/chest  04/17/2013   CLINICAL DATA:  Abdominal pain, nausea.  EXAM: ACUTE ABDOMEN SERIES (ABDOMEN 2 VIEW & CHEST 1 VIEW)  COMPARISON:  01/08/2012  FINDINGS: Chronic blunting  of costophrenic angles. Previous CABG. Dense probably calcified right lower lung nodule and right infrahilar lymph nodes. Vascular clips project over the mediastinum, and in the left axilla. Orthopedic anchors project over the right humeral head. Cervical fixation hardware noted. Heart size normal. Lungs remain clear.  No free air on the erect film. A few gas distended mid abdominal small bowel loops with fluid levels on the erect film. Moderate fecal material in the proximal colon, decompressed distally . Fixation hardware in the lower  lumbar spine. Innumerable small splenic calcified granulomas. Laparoscopic ventral hernia mesh sutures. Spondylitic changes in the lumbar spine.  IMPRESSION: 1. Distended mid abdominal small bowel loops with fluid levels suggesting early ileus versusearly/partial mid small bowel obstruction. 2. No free air. 3. Thoracic postoperative changes without acute abnormality.   Electronically Signed   By: Oley Balm M.D.   On: 04/17/2013 04:47    Lab Results: Basic Metabolic Panel: No results found for this basename: NA, K, CL, CO2, GLUCOSE, BUN, CREATININE, CALCIUM, MG, PHOS,  in the last 72 hours Liver Function Tests: No results found for this basename: AST, ALT, ALKPHOS, BILITOT, PROT, ALBUMIN,  in the last 72 hours   CBC: No results found for this basename: WBC, NEUTROABS, HGB, HCT, MCV, PLT,  in the last 72 hours  No results found for this or any previous visit (from the past 240 hour(s)).   Hospital Course: He came to the emergency department with nausea vomiting. He had abdominal pain as well. He has had previous small bowel obstruction he was concerned that he had another . his evaluation in the emergency department showed that he did have small bowel obstruction. He was treated with anti-emetics pain medications and had nasogastric tube placed. Within about 24 hours he started passing gas then started having diarrhea. He tolerated clear and then full liquids. At that time it appeared that his bowel obstruction had passed and he wanted to go home for further recuperation  Discharge Exam: Blood pressure 135/66, pulse 72, temperature 98.3 F (36.8 C), temperature source Oral, resp. rate 20, height 6' (1.829 m), weight 90.719 kg (200 lb), SpO2 98.00%. He is awake and alert. He is in no acute distress. His abdomen is soft.  Disposition: Home      Discharge Orders   Future Appointments Provider Department Dept Phone   05/05/2013 8:30 AM Vickki Hearing, MD [facility] [phone]   Future Orders Complete By Expires   Discharge patient  As directed    Discharge patient  As directed         Signed: Zaydee Aina L Pager [phone]  04/21/2013, 8:58 AM

## 2013-04-26 DIAGNOSIS — M79609 Pain in unspecified limb: Secondary | ICD-10-CM | POA: Diagnosis not present

## 2013-04-26 DIAGNOSIS — K56609 Unspecified intestinal obstruction, unspecified as to partial versus complete obstruction: Secondary | ICD-10-CM | POA: Diagnosis not present

## 2013-04-27 ENCOUNTER — Ambulatory Visit (INDEPENDENT_AMBULATORY_CARE_PROVIDER_SITE_OTHER): Payer: Medicare Other | Admitting: Adult Health

## 2013-04-27 ENCOUNTER — Encounter: Payer: Self-pay | Admitting: Adult Health

## 2013-04-27 VITALS — BP 133/70 | HR 68 | Ht 72.0 in | Wt 190.0 lb

## 2013-04-27 DIAGNOSIS — I2510 Atherosclerotic heart disease of native coronary artery without angina pectoris: Secondary | ICD-10-CM | POA: Diagnosis not present

## 2013-04-27 DIAGNOSIS — I10 Essential (primary) hypertension: Secondary | ICD-10-CM | POA: Diagnosis not present

## 2013-04-27 MED ORDER — ATORVASTATIN CALCIUM 40 MG PO TABS: 40.0000 mg | ORAL_TABLET | Freq: Every day | ORAL | Status: DC

## 2013-04-27 NOTE — Discharge Summary (Signed)
NAMESAMEER, TEEPLE             ACCOUNT NO.:  [id number]  MEDICAL RECORD NO.:  [id number]  LOCATION:  A325                          FACILITY:  [facility]  PHYSICIAN:  Lorali Khamis L. Juanetta Gosling, M.D.DATE OF BIRTH:  March 26, 1941  DATE OF ADMISSION:  04/17/2013 DATE OF DISCHARGE:  09/23/2014LH                              DISCHARGE SUMMARY   FINAL DISCHARGE DIAGNOSES: 1. Small bowel obstruction, resolved. 2. Acute renal failure. 3. Coronary artery occlusive disease. 4. Ventral hernia. 5. History of mediastinitis. 6. Chronic neck pain status post surgery. 7. Chronic back pain status post surgery. 8. Hyperlipidemia. 9. Depression. 10.Hypertension.  HISTORY:  This is a 72 year old who has multiple medical problems as listed above.  He came to the emergency room to see about nausea, vomiting, and abdominal pain.  He was evaluated in the emergency department and found to have small bowel obstruction.  He was treated and admitted to the hospital because this did not resolve.  PHYSICAL EXAMINATION:  GENERAL:  Well-developed, well-nourished male who is in mild distress due to pain. ABDOMEN:  Distended. CHEST:  Relatively clear. HEART:  Regular without gallop. ABDOMEN:  Distended with hyperactive bowel sounds.  HOSPITAL COURSE:  He was given antiemetics and pain medications.  He had nasogastric tube placed.  He started passing gas after about 36 hours and then had diarrhea.  He was able to eat and keep foods down.  He felt better and was discharged home in improved condition.  He is still having some diarrhea, but he felt like he would be okay to manage this at home now.  He will continue his medications which are Lipitor 20 mg at bedtime, oxycodone 7.5 mg every 4 hours as needed for pain, prednisone 10 mg daily, vitamin C 500 mg daily, vitamin D3 2 or 3 tablets dL, vitamin E 2 to 3 tablets daily, Celexa 40 mg daily, Cardura 8 mg at bedtime, and nitroglycerin as needed.  He will follow with Dr.  Lovell Sheehan, the surgeon, and in my office.     Laquasia Pincus L. Juanetta Gosling, M.D.     ELH/MEDQ  D:  04/26/2013  T:  04/27/2013  Job:  [id number]

## 2013-04-27 NOTE — Progress Notes (Signed)
HPI: [name] is a 72 year old Caucasian male patient formerly of Dr. Dietrich [name] worsening for ongoing assessment and management of CAD, status post coronary artery bypass grafting, with cardiac catheterization during admission in March of 2000 14 requiring PCI of the native circumflex with bare-metal stent. He remained on dual antiplatelet therapy.   He has a significant cardiac history of partial sternal neck and knee and several tissue debridement and repair after a myomectomy, on prednisone therapy. He was last seen in April 2014 at which time the lisinopril had been decreased to 10 mg from 20 mg due to positional dizziness. He had a stress Myoview in March of 2014 which was without evidence of ischemia or infarction, with an EF of 65%. His son be hypotensive on the last visit, and lisinopril was decreased down to 2.5 mg daily. He had a GI bug and without eating and drinking at that time. He did not followup on 11/05/2012 for repeat blood pressure check after medication adjustment, with a blood pressure 121/77, heart rate 66.    He has been recently seen in ER of [facility] and was found to have a SBO. He was treated with NG tube and released after 1 day. He inadvertently stopped his lipitor. He is now off of lisinopril due to hypotension. He denies chest pain or DOE.  Allergies  Allergen Reactions  . Morphine Other (See Comments)    REACTION: made him go crazy; "I had fun w/it; my family didn't think it was too funny"  . Metoprolol Other (See Comments)    REACTION:  Tachycardia; "don't remember how bad it was; it was so long ago"    Current Outpatient Prescriptions  Medication Sig Dispense Refill  . citalopram (CELEXA) 40 MG tablet Take 40 mg by mouth daily.      . Cyanocobalamin (VITAMIN B 12 PO) Take 1 tablet by mouth daily.      Marland Kitchen doxazosin (CARDURA) 4 MG tablet Take 8 mg by mouth at bedtime.      . nitroGLYCERIN (NITROSTAT) 0.4 MG SL tablet Place 0.4 mg under the tongue every 5 (five) minutes  as needed. For chest pain      . oxyCODONE (ROXICODONE) 15 MG immediate release tablet Take 7.5 mg by mouth every 4 (four) hours as needed. For pain      . predniSONE (DELTASONE) 10 MG tablet Take 20 mg by mouth daily.       . vitamin C (ASCORBIC ACID) 500 MG tablet Take 500 mg by mouth daily.      Marland Kitchen VITAMIN D, CHOLECALCIFEROL, PO Take 2-3 tablets by mouth daily.      Marland Kitchen VITAMIN E PO Take 2-3 tablets by mouth daily.      Marland Kitchen atorvastatin (LIPITOR) 40 MG tablet Take 1 tablet (40 mg total) by mouth daily.  90 tablet  2   No current facility-administered medications for this visit.    Past Medical History  Diagnosis Date  . Hyperlipidemia   . Small bowel problem     HAD ALOT OF SCAR TISSUE FROM PREVIOUS SURGERIES..NG WAS INSERTED ...Marland KitchenMarland KitchenNO SURGERY NEEDED...Marland KitchenMarland KitchenIN FOR 8 DAYS  . Disorder of blood     BEEN TREATED BY DERMATOLOGIST X 4 YRS..."BLOOD BLISTERS"  . Arthritis   . Gout   . Anxiety   . Hx MRSA infection     rt shoulder  . Pneumonia     "I've had it 3-4 times"  . Coronary artery disease     IN 2000  STENT PLACED IN 2012 sees Dr. Dietrich [name], saw last 2013  . HTN (hypertension)     sees Dr. Juanetta [name] in [place]  . Small bowel obstruction   . Ventral hernia   . Acute renal failure 04/17/2013    Past Surgical History  Procedure Laterality Date  . Sternal surg  2000    HAS HAD 5-6 ON HIS STERNUM; "caught MRSA in it"  . Lumbar laminectomy/decompression microdiscectomy  06/13/2011    Procedure: LUMBAR LAMINECTOMY/DECOMPRESSION MICRODISCECTOMY;  Surgeon: [name];  Location: [facility];  Service: Neurosurgery;  Laterality: N/A;  Lumbar three, lumbar four-five Laminectomy  . Eye surgery      bilateral cataract  . Anterior cervical corpectomy  12/17/11  . Incision and drainage of wound  ~ 20ll; 12/03/11    "had infection in my right"  . Shoulder open rotator cuff repair  ~ 2011    right  . Peripherally inserted central catheter insertion  2011 & 11/2011  . Cataract extraction w/  intraocular lens  implant, bilateral  ? 2011  . Coronary artery bypass graft  2000    CABG X5  . Back surgery      lumbar  . Tonsillectomy  1949  . Cholecystectomy  2006 "or after"  . Coronary angioplasty with stent placement  2012  . Anterior cervical corpectomy  12/17/2011    Procedure: ANTERIOR CERVICAL CORPECTOMY;  Surgeon: Jake Cassis, MD;  Location: [facility];  Service: Neurosurgery;  Laterality: N/A;  Anterior Cervical Decompression Fusion Five to Thoracic Two with plating    XLK:GMWNUU of systems complete and found to be negative unless listed above  PHYSICAL EXAM BP 133/70  Pulse 68  Ht 6' (1.829 m)  Wt 190 lb (86.183 kg)  BMI 25.76 kg/m2  General: Well developed, well nourished, in no acute distress Head: Eyes PERRLA, No xanthomas.   Normal cephalic and atramatic  Lungs: Clear bilaterally slightly diminished in the bases.  Heart: HRRR S1 S2, without MRG.  Pulses are 2+ & equal.            No carotid bruit. No JVD.  No abdominal bruits. No femoral bruits. Abdomen: Bowel sounds are positive, abdomen soft and non-tender without masses or  Hernia's noted. Msk:  Back normal, normal gait. Normal strength and tone for age. Extremities: No clubbing, cyanosis or edema.  Brace to his left foot to prevent foot drop.  DP +1 Neuro: Alert and oriented X 3. Psych:  Good affect, responds appropriately    ASSESSMENT AND PLAN

## 2013-04-27 NOTE — Assessment & Plan Note (Signed)
He is without chest pain or DOE. He is medically compliant with the exception of lipitor. I will give him new Rx. He will have follow up labs in 6 months with new appt.

## 2013-04-27 NOTE — Patient Instructions (Addendum)
Your physician recommends that you schedule a follow-up appointment in: 6 months You will receive a reminder letter two months in advance reminding you to call and schedule your appointment. If you don't receive this letter, please contact our office.  Your physician has recommended you make the following change in your medication:  1. Start Lipitor 40 mg daily

## 2013-04-27 NOTE — Progress Notes (Deleted)
Name: [name]    DOB: 23-Mar-1941  Age: 72 y.o.  MR#: [id number]       PCP:  Fredirick Maudlin, MD      Insurance: Payor: MEDICARE / Plan: MEDICARE PART A AND B / Product Type: *No Product type* /   CC:    Chief Complaint  Patient presents with  . Coronary Artery Disease  . Hypertension  PT NOTES HE WAS TAKING OFF LIPITOR 6 WEEKS AGO AND WANTS TO DISCUSS NEW STATIN  VS Filed Vitals:   04/27/13 1433  BP: 133/70  Pulse: 68  Height: 6' (1.829 m)  Weight: 190 lb (86.183 kg)    Weights Current Weight  04/27/13 190 lb (86.183 kg)  04/17/13 200 lb (90.719 kg)  02/17/13 185 lb (83.915 kg)    Blood Pressure  BP Readings from Last 3 Encounters:  04/27/13 133/70  04/19/13 135/66  02/17/13 142/74     Admit date:  (Not on file) Last encounter with RMR:  Visit date not found   Allergy Morphine and Metoprolol  Current Outpatient Prescriptions  Medication Sig Dispense Refill  . citalopram (CELEXA) 40 MG tablet Take 40 mg by mouth daily.      . Cyanocobalamin (VITAMIN B 12 PO) Take 1 tablet by mouth daily.      Marland Kitchen doxazosin (CARDURA) 4 MG tablet Take 8 mg by mouth at bedtime.      . nitroGLYCERIN (NITROSTAT) 0.4 MG SL tablet Place 0.4 mg under the tongue every 5 (five) minutes as needed. For chest pain      . oxyCODONE (ROXICODONE) 15 MG immediate release tablet Take 7.5 mg by mouth every 4 (four) hours as needed. For pain      . predniSONE (DELTASONE) 10 MG tablet Take 20 mg by mouth daily.       . vitamin C (ASCORBIC ACID) 500 MG tablet Take 500 mg by mouth daily.      Marland Kitchen VITAMIN D, CHOLECALCIFEROL, PO Take 2-3 tablets by mouth daily.      Marland Kitchen VITAMIN E PO Take 2-3 tablets by mouth daily.       No current facility-administered medications for this visit.    Discontinued Meds:    Medications Discontinued During This Encounter  Medication Reason  . atorvastatin (LIPITOR) 20 MG tablet Error  . antipyrine-benzocaine (AURALGAN) otic solution Error    Patient Active Problem  List   Diagnosis Date Noted  . Small bowel obstruction 04/17/2013  . Acute renal failure 04/17/2013  . Medial meniscus, posterior horn derangement 02/17/2013  . Knee sprain and strain 02/17/2013  . Trigger point with neck pain 03/18/2012  . Rotator cuff tear arthropathy of right shoulder 09/08/2011  . CAD in native artery 01/09/2011  . Ankle fracture 12/25/2010  . Contusion of shoulder, left 12/25/2010  . PEMPHIGUS VULGARIS 07/02/2010  . MRSA 05/13/2010  . PRURITUS 05/13/2010  . SPINAL STENOSIS, LUMBAR 05/13/2010  . HYPERLIPIDEMIA 04/24/2010  . GASTROESOPHAGEAL REFLUX DISEASE 04/24/2010  . VENTRAL HERNIA, INCISIONAL 04/24/2010  . DEGENERATIVE JOINT DISEASE 04/24/2010  . PYOGENIC ARTHRITIS, SHOULDER REGION 02/04/2010  . HYPERTENSION 01/01/2010  . RUPTURE ROTATOR CUFF 02/19/2009    LABS    Component Value Date/Time   NA 134* 04/18/2013 0511   NA 135 04/17/2013 0343   NA 134* 08/31/2012 1231   K 4.3 04/18/2013 0511   K 3.9 04/17/2013 0343   K 3.8 08/31/2012 1231   CL 98 04/18/2013 0511   CL 98 04/17/2013 0343   CL 97  08/31/2012 1231   CO2 30 04/18/2013 0511   CO2 30 04/17/2013 0343   CO2 28 08/31/2012 1231   GLUCOSE 150* 04/18/2013 0511   GLUCOSE 126* 04/17/2013 0343   GLUCOSE 116* 08/31/2012 1231   BUN 22 04/18/2013 0511   BUN 25* 04/17/2013 0343   BUN 15 08/31/2012 1231   CREATININE 1.32 04/18/2013 0511   CREATININE 1.72* 04/17/2013 0343   CREATININE 1.31 09/01/2012 2027   CALCIUM 9.3 04/18/2013 0511   CALCIUM 9.4 04/17/2013 0343   CALCIUM 9.9 08/31/2012 1231   GFRNONAA 53* 04/18/2013 0511   GFRNONAA 39* 04/17/2013 0343   GFRNONAA 54* 09/01/2012 2027   GFRAA 61* 04/18/2013 0511   GFRAA 45* 04/17/2013 0343   GFRAA 62* 09/01/2012 2027   CMP     Component Value Date/Time   NA 134* 04/18/2013 0511   K 4.3 04/18/2013 0511   CL 98 04/18/2013 0511   CO2 30 04/18/2013 0511   GLUCOSE 150* 04/18/2013 0511   BUN 22 04/18/2013 0511   CREATININE 1.32 04/18/2013 0511   CALCIUM 9.3 04/18/2013 0511   PROT 5.7*  04/17/2013 0343   ALBUMIN 3.4* 04/17/2013 0343   AST 15 04/17/2013 0343   ALT 15 04/17/2013 0343   ALKPHOS 64 04/17/2013 0343   BILITOT 0.5 04/17/2013 0343   GFRNONAA 53* 04/18/2013 0511   GFRAA 61* 04/18/2013 0511       Component Value Date/Time   WBC 10.5 04/18/2013 0511   WBC 9.5 04/17/2013 0343   WBC 9.8 09/01/2012 2027   HGB 13.2 04/18/2013 0511   HGB 12.4* 04/17/2013 0343   HGB 9.8* 09/01/2012 2027   HCT 39.4 04/18/2013 0511   HCT 37.2* 04/17/2013 0343   HCT 29.7* 09/01/2012 2027   MCV 89.3 04/18/2013 0511   MCV 89.2 04/17/2013 0343   MCV 90.3 09/01/2012 2027    Lipid Panel     Component Value Date/Time   CHOL 135 10/19/2012 0745   TRIG 75 10/19/2012 0745   HDL 45 10/19/2012 0745   CHOLHDL 3.0 10/19/2012 0745   VLDL 15 10/19/2012 0745   LDLCALC 75 10/19/2012 0745    ABG    Component Value Date/Time   HCO3 25.6* 06/13/2011 1041   TCO2 26 08/26/2012 1433   O2SAT 94.0 06/13/2011 1041     Lab Results  Component Value Date   TSH 0.211* 12/31/2010   BNP (last 3 results) No results found for this basename: PROBNP,  in the last 8760 hours Cardiac Panel (last 3 results) No results found for this basename: CKTOTAL, CKMB, TROPONINI, RELINDX,  in the last 72 hours  Iron/TIBC/Ferritin No results found for this basename: iron, tibc, ferritin     EKG Orders placed during the hospital encounter of 04/17/13  . ED EKG  . EKG 12-LEAD  . EKG 12-LEAD  . EKG     Prior Assessment and Plan Problem List as of 04/27/2013   CAD in native artery   Last Assessment & Plan   10/26/2012 Office Visit Written 10/26/2012 12:03 PM by Jodelle Gross, NP     I discuss results of stress test that were ordered post discharge at the request of discharging cardiologist. Stress test was normal without evidence of recurrent ischemia or areas of progressive CAD. He is given reassurance. The patient now thinks that his discomfort may be related to some arthritis.    MRSA   HYPERLIPIDEMIA   Last Assessment & Plan    10/05/2012 Office Visit Written 10/05/2012  3:09  PM by Jodelle Gross, NP     It is time to reassess his lipid status. We will plan on followup fasting lipids and LFTs can be done during the day he has not stress test.    HYPERTENSION   Last Assessment & Plan   10/26/2012 Office Visit Written 10/26/2012 12:04 PM by Jodelle Gross, NP     He has not taken lisinopril 10 mg for 3 days in the setting of frequent diarrhea with a GI bug he is experienced. Blood pressure is low normal on initial assessment. I have asked for orthostatics. Lying blood pressure 108/67 with a heart rate of 69, sitting blood pressure 92/53 with a heart rate of 74, standing blood pressure 75/45 with a heart rate of 78. He did complain of some mild dizziness with position change. I will have him restart lisinopril at a greatly reduced dose of 2.5 mg daily starting tomorrow. He is advised he can drink fluids today. He will have a followup blood pressure check in 3 days for further assessment. He is also taking good bit of pain medication for her lower back pain which is chronic for him. This may be playing into his hypotension as well. We will do close followup on blood pressure evaluation.    GASTROESOPHAGEAL REFLUX DISEASE   VENTRAL HERNIA, INCISIONAL   PEMPHIGUS VULGARIS   PRURITUS   PYOGENIC ARTHRITIS, SHOULDER REGION   DEGENERATIVE JOINT DISEASE   SPINAL STENOSIS, LUMBAR   RUPTURE ROTATOR CUFF   Ankle fracture   Contusion of shoulder, left   Rotator cuff tear arthropathy of right shoulder   Trigger point with neck pain   Medial meniscus, posterior horn derangement   Knee sprain and strain   Small bowel obstruction   Acute renal failure       Imaging: Ct Abdomen Pelvis Wo Contrast  04/17/2013   CLINICAL DATA:  Abdominal pain  EXAM: CT ABDOMEN AND PELVIS WITHOUT CONTRAST  TECHNIQUE: Multidetector CT imaging of the abdomen and pelvis was performed following the standard protocol without intravenous contrast.   COMPARISON:  07/16/2011  FINDINGS: Calcified granuloma in the superior segment right lower lobe with a calcified right infrahilar lymph node. Remainder of lung bases clear. Patchy coronary and aortoiliac arterial calcifications. Multiple small calcified granulomas in the spleen and liver. Surgical clips in the gallbladder fossa. Small hiatal hernia. Stomach distended by ingested material. Incomplete distal passage of the oral contrast material. Previous ventral hernia mesh repair. The proximal small bowel is decompressed. There is fluid distention of multiple mid small bowel loops with transition to decompressed distal small bowel in the right lower quadrant, with associated small bowel feces sign. No discrete mass or abscess as source of the distal small bowel obstruction. The colon is nondilated. Urinary bladder is physiologically distended. Moderate prostatic enlargement with central coarse calcifications. No ascites. No free air. No adenopathy localized. Changes of lumbar decompression L2-3, L3-4, PLIF L4-S1. Spondylitic changes in the visualized lower thoracic and lumbar spine.  IMPRESSION: 1. Mid/distal small bowel obstruction with transition point in the right lower quadrant, no evident etiology, suggesting adhesions as most likely source.   Electronically Signed   By: Oley Balm M.D.   On: 04/17/2013 05:43   Dg Abd Acute W/chest  04/17/2013   CLINICAL DATA:  Abdominal pain, nausea.  EXAM: ACUTE ABDOMEN SERIES (ABDOMEN 2 VIEW & CHEST 1 VIEW)  COMPARISON:  01/08/2012  FINDINGS: Chronic blunting of costophrenic angles. Previous CABG. Dense probably calcified right lower lung  nodule and right infrahilar lymph nodes. Vascular clips project over the mediastinum, and in the left axilla. Orthopedic anchors project over the right humeral head. Cervical fixation hardware noted. Heart size normal. Lungs remain clear.  No free air on the erect film. A few gas distended mid abdominal small bowel loops with fluid  levels on the erect film. Moderate fecal material in the proximal colon, decompressed distally . Fixation hardware in the lower lumbar spine. Innumerable small splenic calcified granulomas. Laparoscopic ventral hernia mesh sutures. Spondylitic changes in the lumbar spine.  IMPRESSION: 1. Distended mid abdominal small bowel loops with fluid levels suggesting early ileus versusearly/partial mid small bowel obstruction. 2. No free air. 3. Thoracic postoperative changes without acute abnormality.   Electronically Signed   By: Oley Balm M.D.   On: 04/17/2013 04:47

## 2013-04-27 NOTE — Assessment & Plan Note (Signed)
Good control of BP.  No additions or changes at this time

## 2013-05-05 ENCOUNTER — Ambulatory Visit (INDEPENDENT_AMBULATORY_CARE_PROVIDER_SITE_OTHER): Payer: Medicare Other

## 2013-05-05 ENCOUNTER — Ambulatory Visit (INDEPENDENT_AMBULATORY_CARE_PROVIDER_SITE_OTHER): Payer: Medicare Other | Admitting: Orthopedic Surgery

## 2013-05-05 ENCOUNTER — Encounter: Payer: Self-pay | Admitting: Orthopedic Surgery

## 2013-05-05 ENCOUNTER — Ambulatory Visit: Payer: Medicare Other

## 2013-05-05 VITALS — BP 164/79 | Ht 72.0 in | Wt 187.0 lb

## 2013-05-05 DIAGNOSIS — M25552 Pain in left hip: Secondary | ICD-10-CM

## 2013-05-05 DIAGNOSIS — M48061 Spinal stenosis, lumbar region without neurogenic claudication: Secondary | ICD-10-CM

## 2013-05-05 DIAGNOSIS — M25559 Pain in unspecified hip: Secondary | ICD-10-CM

## 2013-05-05 DIAGNOSIS — M25551 Pain in right hip: Secondary | ICD-10-CM

## 2013-05-05 DIAGNOSIS — M8700 Idiopathic aseptic necrosis of unspecified bone: Secondary | ICD-10-CM

## 2013-05-05 DIAGNOSIS — M879 Osteonecrosis, unspecified: Secondary | ICD-10-CM | POA: Insufficient documentation

## 2013-05-05 NOTE — Progress Notes (Signed)
Patient ID: [name], male   DOB: 18-Oct-1940, 72 y.o.   MRN: [id number]  Chief Complaint  Patient presents with  . 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 Patient asymptomatic today.  Review of systems related to his hip negative  X-rays ordered taken and reviewed show stable AVN without collapse  X-ray again in 6 months

## 2013-05-05 NOTE — Patient Instructions (Signed)
Stable avn hips

## 2013-05-25 DIAGNOSIS — M79609 Pain in unspecified limb: Secondary | ICD-10-CM | POA: Diagnosis not present

## 2013-05-25 DIAGNOSIS — I259 Chronic ischemic heart disease, unspecified: Secondary | ICD-10-CM | POA: Diagnosis not present

## 2013-05-25 DIAGNOSIS — Z23 Encounter for immunization: Secondary | ICD-10-CM | POA: Diagnosis not present

## 2013-05-25 DIAGNOSIS — M545 Low back pain: Secondary | ICD-10-CM | POA: Diagnosis not present

## 2013-06-06 ENCOUNTER — Other Ambulatory Visit (HOSPITAL_COMMUNITY): Payer: Self-pay | Admitting: Pulmonary Disease

## 2013-06-06 DIAGNOSIS — M25569 Pain in unspecified knee: Secondary | ICD-10-CM | POA: Diagnosis not present

## 2013-06-06 DIAGNOSIS — M545 Low back pain: Secondary | ICD-10-CM

## 2013-06-07 ENCOUNTER — Ambulatory Visit (HOSPITAL_COMMUNITY)
Admission: RE | Admit: 2013-06-07 | Discharge: 2013-06-07 | Disposition: A | Discharge status: Home / Self Care | Payer: Medicare Other | Source: Ambulatory Visit | Attending: Pulmonary Disease | Admitting: Pulmonary Disease

## 2013-06-07 ENCOUNTER — Other Ambulatory Visit (HOSPITAL_COMMUNITY): Payer: Self-pay | Admitting: Pulmonary Disease

## 2013-06-07 DIAGNOSIS — M25561 Pain in right knee: Secondary | ICD-10-CM

## 2013-06-07 DIAGNOSIS — M545 Low back pain, unspecified: Secondary | ICD-10-CM | POA: Insufficient documentation

## 2013-06-07 DIAGNOSIS — M48061 Spinal stenosis, lumbar region without neurogenic claudication: Secondary | ICD-10-CM | POA: Diagnosis not present

## 2013-06-07 DIAGNOSIS — S8990XA Unspecified injury of unspecified lower leg, initial encounter: Secondary | ICD-10-CM | POA: Diagnosis not present

## 2013-06-07 DIAGNOSIS — M899 Disorder of bone, unspecified: Secondary | ICD-10-CM | POA: Diagnosis not present

## 2013-06-07 DIAGNOSIS — M47817 Spondylosis without myelopathy or radiculopathy, lumbosacral region: Secondary | ICD-10-CM | POA: Insufficient documentation

## 2013-06-07 DIAGNOSIS — M47812 Spondylosis without myelopathy or radiculopathy, cervical region: Secondary | ICD-10-CM | POA: Diagnosis not present

## 2013-06-07 DIAGNOSIS — M5126 Other intervertebral disc displacement, lumbar region: Secondary | ICD-10-CM | POA: Diagnosis not present

## 2013-06-07 DIAGNOSIS — M25569 Pain in unspecified knee: Secondary | ICD-10-CM | POA: Diagnosis not present

## 2013-06-16 ENCOUNTER — Other Ambulatory Visit (HOSPITAL_COMMUNITY): Payer: Self-pay | Admitting: *Deleted

## 2013-06-16 ENCOUNTER — Encounter (HOSPITAL_COMMUNITY): Payer: Self-pay | Admitting: *Deleted

## 2013-06-16 ENCOUNTER — Other Ambulatory Visit: Payer: Self-pay | Admitting: Neurosurgery

## 2013-06-16 DIAGNOSIS — M545 Low back pain: Secondary | ICD-10-CM | POA: Diagnosis not present

## 2013-06-16 DIAGNOSIS — E663 Overweight: Secondary | ICD-10-CM | POA: Diagnosis not present

## 2013-06-16 DIAGNOSIS — M5126 Other intervertebral disc displacement, lumbar region: Secondary | ICD-10-CM | POA: Diagnosis not present

## 2013-06-16 DIAGNOSIS — IMO0002 Reserved for concepts with insufficient information to code with codable children: Secondary | ICD-10-CM | POA: Diagnosis not present

## 2013-06-16 MED ORDER — CEFAZOLIN SODIUM-DEXTROSE 2-3 GM-% IV SOLR
2.0000 g | Freq: Once | INTRAVENOUS | Status: AC
  Administered 2013-06-17: 2 g via INTRAVENOUS
  Filled 2013-06-16 (×2): qty 50

## 2013-06-16 NOTE — Progress Notes (Signed)
Pt states he was seen by the NP at his cardiologist's office in October and had a good checkup. States she told him he didn't need to return for a year. Denies any chest pain.

## 2013-06-17 ENCOUNTER — Inpatient Hospital Stay (HOSPITAL_COMMUNITY): Payer: Medicare Other

## 2013-06-17 ENCOUNTER — Encounter (HOSPITAL_COMMUNITY): Payer: Medicare Other | Admitting: Anesthesiology

## 2013-06-17 ENCOUNTER — Encounter (HOSPITAL_COMMUNITY)
Admission: RE | Disposition: A | Discharge status: Rehabilitation Facility | Payer: Self-pay | Source: Ambulatory Visit | Attending: Neurosurgery

## 2013-06-17 ENCOUNTER — Encounter (HOSPITAL_COMMUNITY): Payer: Self-pay | Admitting: *Deleted

## 2013-06-17 ENCOUNTER — Inpatient Hospital Stay (HOSPITAL_COMMUNITY): Payer: Medicare Other | Admitting: Anesthesiology

## 2013-06-17 ENCOUNTER — Inpatient Hospital Stay (HOSPITAL_COMMUNITY)
Admission: RE | Admit: 2013-06-17 | Discharge: 2013-06-22 | DRG: 519 | Disposition: A | Discharge status: Rehabilitation Facility | Payer: Medicare Other | Source: Ambulatory Visit | Attending: Neurosurgery | Admitting: Neurosurgery

## 2013-06-17 DIAGNOSIS — K592 Neurogenic bowel, not elsewhere classified: Secondary | ICD-10-CM | POA: Diagnosis present

## 2013-06-17 DIAGNOSIS — G8220 Paraplegia, unspecified: Secondary | ICD-10-CM | POA: Diagnosis not present

## 2013-06-17 DIAGNOSIS — M19019 Primary osteoarthritis, unspecified shoulder: Secondary | ICD-10-CM | POA: Diagnosis present

## 2013-06-17 DIAGNOSIS — Z9089 Acquired absence of other organs: Secondary | ICD-10-CM | POA: Diagnosis not present

## 2013-06-17 DIAGNOSIS — G834 Cauda equina syndrome: Secondary | ICD-10-CM | POA: Diagnosis present

## 2013-06-17 DIAGNOSIS — R32 Unspecified urinary incontinence: Secondary | ICD-10-CM | POA: Diagnosis not present

## 2013-06-17 DIAGNOSIS — G9741 Accidental puncture or laceration of dura during a procedure: Secondary | ICD-10-CM | POA: Diagnosis not present

## 2013-06-17 DIAGNOSIS — Z9861 Coronary angioplasty status: Secondary | ICD-10-CM

## 2013-06-17 DIAGNOSIS — Z5189 Encounter for other specified aftercare: Secondary | ICD-10-CM | POA: Diagnosis not present

## 2013-06-17 DIAGNOSIS — M109 Gout, unspecified: Secondary | ICD-10-CM | POA: Diagnosis present

## 2013-06-17 DIAGNOSIS — Z79899 Other long term (current) drug therapy: Secondary | ICD-10-CM

## 2013-06-17 DIAGNOSIS — G96198 Other disorders of meninges, not elsewhere classified: Secondary | ICD-10-CM | POA: Diagnosis present

## 2013-06-17 DIAGNOSIS — Z961 Presence of intraocular lens: Secondary | ICD-10-CM

## 2013-06-17 DIAGNOSIS — M519 Unspecified thoracic, thoracolumbar and lumbosacral intervertebral disc disorder: Secondary | ICD-10-CM | POA: Diagnosis not present

## 2013-06-17 DIAGNOSIS — I2510 Atherosclerotic heart disease of native coronary artery without angina pectoris: Secondary | ICD-10-CM | POA: Diagnosis present

## 2013-06-17 DIAGNOSIS — Z981 Arthrodesis status: Secondary | ICD-10-CM | POA: Diagnosis not present

## 2013-06-17 DIAGNOSIS — Z87891 Personal history of nicotine dependence: Secondary | ICD-10-CM | POA: Diagnosis not present

## 2013-06-17 DIAGNOSIS — Z9849 Cataract extraction status, unspecified eye: Secondary | ICD-10-CM | POA: Diagnosis not present

## 2013-06-17 DIAGNOSIS — M216X9 Other acquired deformities of unspecified foot: Secondary | ICD-10-CM | POA: Diagnosis present

## 2013-06-17 DIAGNOSIS — M5126 Other intervertebral disc displacement, lumbar region: Secondary | ICD-10-CM | POA: Diagnosis not present

## 2013-06-17 DIAGNOSIS — N319 Neuromuscular dysfunction of bladder, unspecified: Secondary | ICD-10-CM | POA: Diagnosis present

## 2013-06-17 DIAGNOSIS — N390 Urinary tract infection, site not specified: Secondary | ICD-10-CM | POA: Diagnosis not present

## 2013-06-17 DIAGNOSIS — IMO0002 Reserved for concepts with insufficient information to code with codable children: Secondary | ICD-10-CM | POA: Diagnosis not present

## 2013-06-17 DIAGNOSIS — Z8614 Personal history of Methicillin resistant Staphylococcus aureus infection: Secondary | ICD-10-CM | POA: Diagnosis not present

## 2013-06-17 DIAGNOSIS — I10 Essential (primary) hypertension: Secondary | ICD-10-CM | POA: Diagnosis present

## 2013-06-17 DIAGNOSIS — Z951 Presence of aortocoronary bypass graft: Secondary | ICD-10-CM

## 2013-06-17 DIAGNOSIS — F411 Generalized anxiety disorder: Secondary | ICD-10-CM | POA: Diagnosis present

## 2013-06-17 DIAGNOSIS — J984 Other disorders of lung: Secondary | ICD-10-CM | POA: Diagnosis not present

## 2013-06-17 DIAGNOSIS — E785 Hyperlipidemia, unspecified: Secondary | ICD-10-CM | POA: Diagnosis present

## 2013-06-17 DIAGNOSIS — K56609 Unspecified intestinal obstruction, unspecified as to partial versus complete obstruction: Secondary | ICD-10-CM | POA: Diagnosis not present

## 2013-06-17 DIAGNOSIS — M79609 Pain in unspecified limb: Secondary | ICD-10-CM | POA: Diagnosis not present

## 2013-06-17 DIAGNOSIS — Z8249 Family history of ischemic heart disease and other diseases of the circulatory system: Secondary | ICD-10-CM

## 2013-06-17 HISTORY — DX: Idiopathic aseptic necrosis of unspecified bone: M87.00

## 2013-06-17 HISTORY — PX: LUMBAR LAMINECTOMY/DECOMPRESSION MICRODISCECTOMY: SHX5026

## 2013-06-17 LAB — CBC
HCT: 34 % — ABNORMAL LOW (ref 39.0–52.0)
MCH: 30.8 pg (ref 26.0–34.0)
MCHC: 34.4 g/dL (ref 30.0–36.0)
Platelets: 214 10*3/uL (ref 150–400)
RDW: 14.7 % (ref 11.5–15.5)
WBC: 7.1 10*3/uL (ref 4.0–10.5)

## 2013-06-17 LAB — BASIC METABOLIC PANEL
BUN: 22 mg/dL (ref 6–23)
Calcium: 9.2 mg/dL (ref 8.4–10.5)
Creatinine, Ser: 1.37 mg/dL — ABNORMAL HIGH (ref 0.50–1.35)
GFR calc non Af Amer: 51 mL/min — ABNORMAL LOW (ref >90)
Glucose, Bld: 113 mg/dL — ABNORMAL HIGH (ref 70–99)
Sodium: 138 mEq/L (ref 135–145)

## 2013-06-17 LAB — SURGICAL PCR SCREEN
MRSA, PCR: NEGATIVE
Staphylococcus aureus: NEGATIVE

## 2013-06-17 SURGERY — LUMBAR LAMINECTOMY/DECOMPRESSION MICRODISCECTOMY 1 LEVEL
Anesthesia: General | Laterality: Right | Wound class: Clean

## 2013-06-17 MED ORDER — ACETAMINOPHEN 650 MG RE SUPP: 650.0000 mg | RECTAL | Status: DC | PRN

## 2013-06-17 MED ORDER — HYDROMORPHONE 0.3 MG/ML IV SOLN
INTRAVENOUS | Status: AC
  Filled 2013-06-17: qty 25

## 2013-06-17 MED ORDER — PHENOL 1.4 % MT LIQD: 1.0000 | OROMUCOSAL | Status: DC | PRN

## 2013-06-17 MED ORDER — HYDROMORPHONE HCL PF 1 MG/ML IJ SOLN
INTRAMUSCULAR | Status: AC
  Filled 2013-06-17: qty 1

## 2013-06-17 MED ORDER — MUPIROCIN 2 % EX OINT
1.0000 "application " | TOPICAL_OINTMENT | Freq: Two times a day (BID) | CUTANEOUS | Status: DC
  Administered 2013-06-17 – 2013-06-22 (×11): 1 via NASAL
  Filled 2013-06-17 (×2): qty 22

## 2013-06-17 MED ORDER — NITROGLYCERIN 0.4 MG SL SUBL: 0.4000 mg | SUBLINGUAL_TABLET | SUBLINGUAL | Status: DC | PRN

## 2013-06-17 MED ORDER — ARTIFICIAL TEARS OP OINT
TOPICAL_OINTMENT | OPHTHALMIC | Status: DC | PRN
  Administered 2013-06-17: 1 via OPHTHALMIC

## 2013-06-17 MED ORDER — LACTATED RINGERS IV SOLN
INTRAVENOUS | Status: DC
  Administered 2013-06-17: 12:00:00 via INTRAVENOUS

## 2013-06-17 MED ORDER — ZOLPIDEM TARTRATE 5 MG PO TABS: 5.0000 mg | ORAL_TABLET | Freq: Every evening | ORAL | Status: DC | PRN

## 2013-06-17 MED ORDER — GABAPENTIN 600 MG PO TABS
300.0000 mg | ORAL_TABLET | Freq: Three times a day (TID) | ORAL | Status: DC
  Administered 2013-06-17: 300 mg via ORAL
  Filled 2013-06-17 (×4): qty 0.5

## 2013-06-17 MED ORDER — OXYCODONE-ACETAMINOPHEN 5-325 MG PO TABS
1.0000 | ORAL_TABLET | ORAL | Status: DC | PRN
  Administered 2013-06-18 – 2013-06-22 (×12): 2 via ORAL
  Filled 2013-06-17 (×13): qty 2

## 2013-06-17 MED ORDER — ROCURONIUM BROMIDE 100 MG/10ML IV SOLN
INTRAVENOUS | Status: DC | PRN
  Administered 2013-06-17: 10 mg via INTRAVENOUS
  Administered 2013-06-17: 30 mg via INTRAVENOUS
  Administered 2013-06-17 (×2): 10 mg via INTRAVENOUS

## 2013-06-17 MED ORDER — ONDANSETRON HCL 4 MG/2ML IJ SOLN
4.0000 mg | Freq: Once | INTRAMUSCULAR | Status: AC | PRN
  Administered 2013-06-17: 4 mg via INTRAVENOUS

## 2013-06-17 MED ORDER — NEOSTIGMINE METHYLSULFATE 1 MG/ML IJ SOLN
INTRAMUSCULAR | Status: DC | PRN
  Administered 2013-06-17: 4 mg via INTRAVENOUS

## 2013-06-17 MED ORDER — ONDANSETRON HCL 4 MG/2ML IJ SOLN: 4.0000 mg | Freq: Four times a day (QID) | INTRAMUSCULAR | Status: DC | PRN

## 2013-06-17 MED ORDER — DOCUSATE SODIUM 100 MG PO CAPS
100.0000 mg | ORAL_CAPSULE | Freq: Two times a day (BID) | ORAL | Status: DC
  Administered 2013-06-17 – 2013-06-22 (×9): 100 mg via ORAL
  Filled 2013-06-17 (×9): qty 1

## 2013-06-17 MED ORDER — ONDANSETRON HCL 4 MG/2ML IJ SOLN
4.0000 mg | INTRAMUSCULAR | Status: DC | PRN
  Administered 2013-06-18 – 2013-06-19 (×6): 4 mg via INTRAVENOUS
  Filled 2013-06-17 (×6): qty 2

## 2013-06-17 MED ORDER — HYDROMORPHONE 0.3 MG/ML IV SOLN
INTRAVENOUS | Status: DC
  Administered 2013-06-17: 20:00:00 via INTRAVENOUS
  Administered 2013-06-18: 1.5 mg via INTRAVENOUS
  Administered 2013-06-18: 10:00:00 via INTRAVENOUS
  Administered 2013-06-18: 2.1 mg via INTRAVENOUS
  Administered 2013-06-18: via INTRAVENOUS
  Administered 2013-06-18: 4.5 mg via INTRAVENOUS
  Administered 2013-06-18: 7.5 mg via INTRAVENOUS
  Administered 2013-06-18: 3.16 mg via INTRAVENOUS
  Administered 2013-06-19: 7.5 mg via INTRAVENOUS
  Administered 2013-06-19: 5.1 mg via INTRAVENOUS
  Filled 2013-06-17 (×4): qty 25

## 2013-06-17 MED ORDER — ONDANSETRON HCL 4 MG/2ML IJ SOLN
INTRAMUSCULAR | Status: AC
  Filled 2013-06-17: qty 2

## 2013-06-17 MED ORDER — PHENYLEPHRINE HCL 10 MG/ML IJ SOLN
INTRAMUSCULAR | Status: DC | PRN
  Administered 2013-06-17: 40 ug via INTRAVENOUS

## 2013-06-17 MED ORDER — 0.9 % SODIUM CHLORIDE (POUR BTL) OPTIME
TOPICAL | Status: DC | PRN
  Administered 2013-06-17: 1000 mL

## 2013-06-17 MED ORDER — DEXAMETHASONE SODIUM PHOSPHATE 4 MG/ML IJ SOLN
INTRAMUSCULAR | Status: DC | PRN
  Administered 2013-06-17: 8 mg via INTRAVENOUS

## 2013-06-17 MED ORDER — LACTATED RINGERS IV SOLN
INTRAVENOUS | Status: DC | PRN
  Administered 2013-06-17 (×2): via INTRAVENOUS

## 2013-06-17 MED ORDER — DIPHENHYDRAMINE HCL 50 MG/ML IJ SOLN: 12.5000 mg | Freq: Four times a day (QID) | INTRAMUSCULAR | Status: DC | PRN

## 2013-06-17 MED ORDER — SODIUM CHLORIDE 0.9 % IJ SOLN
3.0000 mL | Freq: Two times a day (BID) | INTRAMUSCULAR | Status: DC
  Administered 2013-06-17 – 2013-06-21 (×3): 3 mL via INTRAVENOUS

## 2013-06-17 MED ORDER — SODIUM CHLORIDE 0.9 % IJ SOLN: 3.0000 mL | INTRAMUSCULAR | Status: DC | PRN

## 2013-06-17 MED ORDER — DOXAZOSIN MESYLATE 8 MG PO TABS
8.0000 mg | ORAL_TABLET | Freq: Every day | ORAL | Status: DC
  Administered 2013-06-18 – 2013-06-21 (×4): 8 mg via ORAL
  Filled 2013-06-17 (×5): qty 1

## 2013-06-17 MED ORDER — DIAZEPAM 5 MG PO TABS
5.0000 mg | ORAL_TABLET | Freq: Four times a day (QID) | ORAL | Status: DC | PRN
  Administered 2013-06-18 – 2013-06-22 (×5): 5 mg via ORAL
  Filled 2013-06-17 (×5): qty 1

## 2013-06-17 MED ORDER — HEPARIN SODIUM (PORCINE) 5000 UNIT/ML IJ SOLN
5000.0000 [IU] | Freq: Three times a day (TID) | INTRAMUSCULAR | Status: DC
  Administered 2013-06-17 – 2013-06-22 (×15): 5000 [IU] via SUBCUTANEOUS
  Filled 2013-06-17 (×17): qty 1

## 2013-06-17 MED ORDER — PREDNISONE 20 MG PO TABS
20.0000 mg | ORAL_TABLET | Freq: Every day | ORAL | Status: DC
  Administered 2013-06-18 – 2013-06-22 (×5): 20 mg via ORAL
  Filled 2013-06-17 (×5): qty 1

## 2013-06-17 MED ORDER — ACETAMINOPHEN 325 MG PO TABS: 650.0000 mg | ORAL_TABLET | ORAL | Status: DC | PRN

## 2013-06-17 MED ORDER — NALOXONE HCL 0.4 MG/ML IJ SOLN: 0.4000 mg | INTRAMUSCULAR | Status: DC | PRN

## 2013-06-17 MED ORDER — PROPOFOL 10 MG/ML IV BOLUS
INTRAVENOUS | Status: DC | PRN
  Administered 2013-06-17: 200 mg via INTRAVENOUS

## 2013-06-17 MED ORDER — MENTHOL 3 MG MT LOZG: 1.0000 | LOZENGE | OROMUCOSAL | Status: DC | PRN

## 2013-06-17 MED ORDER — HYDROMORPHONE HCL PF 1 MG/ML IJ SOLN
0.2500 mg | INTRAMUSCULAR | Status: DC | PRN
  Administered 2013-06-17 (×2): 1 mg via INTRAVENOUS

## 2013-06-17 MED ORDER — ATORVASTATIN CALCIUM 40 MG PO TABS
40.0000 mg | ORAL_TABLET | Freq: Every day | ORAL | Status: DC
  Administered 2013-06-17 – 2013-06-22 (×6): 40 mg via ORAL
  Filled 2013-06-17 (×6): qty 1

## 2013-06-17 MED ORDER — SODIUM CHLORIDE 0.9 % IJ SOLN: 9.0000 mL | INTRAMUSCULAR | Status: DC | PRN

## 2013-06-17 MED ORDER — LIDOCAINE HCL (CARDIAC) 20 MG/ML IV SOLN
INTRAVENOUS | Status: DC | PRN
  Administered 2013-06-17: 100 mg via INTRAVENOUS

## 2013-06-17 MED ORDER — FENTANYL CITRATE 0.05 MG/ML IJ SOLN
INTRAMUSCULAR | Status: DC | PRN
  Administered 2013-06-17 (×2): 50 ug via INTRAVENOUS
  Administered 2013-06-17: 100 ug via INTRAVENOUS
  Administered 2013-06-17: 50 ug via INTRAVENOUS
  Administered 2013-06-17: 100 ug via INTRAVENOUS

## 2013-06-17 MED ORDER — THROMBIN 20000 UNITS EX SOLR
CUTANEOUS | Status: DC | PRN
  Administered 2013-06-17: 17:00:00 via TOPICAL

## 2013-06-17 MED ORDER — SODIUM CHLORIDE 0.9 % IV SOLN
INTRAVENOUS | Status: DC
  Administered 2013-06-17 – 2013-06-19 (×4): via INTRAVENOUS

## 2013-06-17 MED ORDER — ONDANSETRON HCL 4 MG/2ML IJ SOLN
INTRAMUSCULAR | Status: DC | PRN
  Administered 2013-06-17: 4 mg via INTRAVENOUS

## 2013-06-17 MED ORDER — BACITRACIN ZINC 500 UNIT/GM EX OINT
TOPICAL_OINTMENT | CUTANEOUS | Status: DC | PRN
  Administered 2013-06-17: 1 via TOPICAL

## 2013-06-17 MED ORDER — EPHEDRINE SULFATE 50 MG/ML IJ SOLN
INTRAMUSCULAR | Status: DC | PRN
  Administered 2013-06-17 (×2): 5 mg via INTRAVENOUS

## 2013-06-17 MED ORDER — MUPIROCIN 2 % EX OINT
TOPICAL_OINTMENT | CUTANEOUS | Status: AC
  Filled 2013-06-17: qty 22

## 2013-06-17 MED ORDER — DIPHENHYDRAMINE HCL 12.5 MG/5ML PO ELIX: 12.5000 mg | ORAL_SOLUTION | Freq: Four times a day (QID) | ORAL | Status: DC | PRN

## 2013-06-17 MED ORDER — SODIUM CHLORIDE 0.9 % IV SOLN: 250.0000 mL | INTRAVENOUS | Status: DC

## 2013-06-17 MED ORDER — CEFAZOLIN SODIUM 1-5 GM-% IV SOLN
1.0000 g | Freq: Three times a day (TID) | INTRAVENOUS | Status: AC
  Administered 2013-06-17 – 2013-06-18 (×2): 1 g via INTRAVENOUS
  Filled 2013-06-17 (×2): qty 50

## 2013-06-17 MED ORDER — GLYCOPYRROLATE 0.2 MG/ML IJ SOLN
INTRAMUSCULAR | Status: DC | PRN
  Administered 2013-06-17: 0.6 mg via INTRAVENOUS
  Administered 2013-06-17: 0.4 mg via INTRAVENOUS

## 2013-06-17 MED ORDER — CITALOPRAM HYDROBROMIDE 40 MG PO TABS
40.0000 mg | ORAL_TABLET | Freq: Every day | ORAL | Status: DC
  Administered 2013-06-18 – 2013-06-22 (×5): 40 mg via ORAL
  Filled 2013-06-17 (×5): qty 1

## 2013-06-17 SURGICAL SUPPLY — 58 items
BENZOIN TINCTURE PRP APPL 2/3 (GAUZE/BANDAGES/DRESSINGS) ×2 IMPLANT
BLADE SURG ROTATE 9660 (MISCELLANEOUS) IMPLANT
BUR ACORN 6.0 (BURR) ×2 IMPLANT
BUR MATCHSTICK NEURO 3.0 LAGG (BURR) ×2 IMPLANT
CANISTER SUCT 3000ML (MISCELLANEOUS) ×2 IMPLANT
CONT SPEC 4OZ CLIKSEAL STRL BL (MISCELLANEOUS) ×2 IMPLANT
DRAPE LAPAROTOMY 100X72X124 (DRAPES) ×2 IMPLANT
DRAPE MICROSCOPE LEICA (MISCELLANEOUS) ×2 IMPLANT
DRAPE POUCH INSTRU U-SHP 10X18 (DRAPES) ×2 IMPLANT
DRAPE PROXIMA HALF (DRAPES) ×2 IMPLANT
DRESSING TELFA 8X3 (GAUZE/BANDAGES/DRESSINGS) ×2 IMPLANT
DRSG OPSITE POSTOP 4X6 (GAUZE/BANDAGES/DRESSINGS) ×2 IMPLANT
DRSG PAD ABDOMINAL 8X10 ST (GAUZE/BANDAGES/DRESSINGS) IMPLANT
DURAPREP 26ML APPLICATOR (WOUND CARE) ×2 IMPLANT
DURASEAL APPLICATOR TIP (TIP) ×2 IMPLANT
DURASEAL SPINE SEALANT 3ML (MISCELLANEOUS) ×2 IMPLANT
ELECT REM PT RETURN 9FT ADLT (ELECTROSURGICAL) ×2
ELECTRODE REM PT RTRN 9FT ADLT (ELECTROSURGICAL) ×1 IMPLANT
GAUZE SPONGE 4X4 16PLY XRAY LF (GAUZE/BANDAGES/DRESSINGS) IMPLANT
GLOVE BIOGEL M 8.0 STRL (GLOVE) ×2 IMPLANT
GLOVE BIOGEL PI IND STRL 7.5 (GLOVE) ×3 IMPLANT
GLOVE BIOGEL PI INDICATOR 7.5 (GLOVE) ×3
GLOVE ECLIPSE 7.5 STRL STRAW (GLOVE) ×4 IMPLANT
GLOVE ECLIPSE 8.5 STRL (GLOVE) ×2 IMPLANT
GLOVE EXAM NITRILE LRG STRL (GLOVE) IMPLANT
GLOVE EXAM NITRILE MD LF STRL (GLOVE) IMPLANT
GLOVE EXAM NITRILE XL STR (GLOVE) IMPLANT
GLOVE EXAM NITRILE XS STR PU (GLOVE) IMPLANT
GOWN BRE IMP SLV AUR LG STRL (GOWN DISPOSABLE) ×2 IMPLANT
GOWN BRE IMP SLV AUR XL STRL (GOWN DISPOSABLE) ×4 IMPLANT
GOWN STRL REIN 2XL LVL4 (GOWN DISPOSABLE) IMPLANT
KIT BASIN OR (CUSTOM PROCEDURE TRAY) ×2 IMPLANT
KIT ROOM TURNOVER OR (KITS) ×2 IMPLANT
NEEDLE HYPO 18GX1.5 BLUNT FILL (NEEDLE) IMPLANT
NEEDLE HYPO 21X1.5 SAFETY (NEEDLE) IMPLANT
NEEDLE HYPO 25X1 1.5 SAFETY (NEEDLE) ×2 IMPLANT
NEEDLE SPNL 20GX3.5 QUINCKE YW (NEEDLE) ×2 IMPLANT
NS IRRIG 1000ML POUR BTL (IV SOLUTION) ×2 IMPLANT
PACK LAMINECTOMY NEURO (CUSTOM PROCEDURE TRAY) ×2 IMPLANT
PAD ARMBOARD 7.5X6 YLW CONV (MISCELLANEOUS) ×6 IMPLANT
PATTIES SURGICAL .5 X1 (DISPOSABLE) IMPLANT
RUBBERBAND STERILE (MISCELLANEOUS) ×4 IMPLANT
SPONGE GAUZE 4X4 12PLY (GAUZE/BANDAGES/DRESSINGS) ×2 IMPLANT
SPONGE LAP 4X18 X RAY DECT (DISPOSABLE) IMPLANT
SPONGE SURGIFOAM ABS GEL SZ50 (HEMOSTASIS) ×2 IMPLANT
STRIP CLOSURE SKIN 1/2X4 (GAUZE/BANDAGES/DRESSINGS) ×2 IMPLANT
SUT ETHILON 2 0 FS 18 (SUTURE) ×2 IMPLANT
SUT VIC AB 0 CT1 18XCR BRD8 (SUTURE) ×2 IMPLANT
SUT VIC AB 0 CT1 8-18 (SUTURE) ×4
SUT VIC AB 2-0 CP2 18 (SUTURE) ×2 IMPLANT
SUT VIC AB 3-0 SH 8-18 (SUTURE) ×2 IMPLANT
SYR 20CC LL (SYRINGE) ×2 IMPLANT
SYR 20ML ECCENTRIC (SYRINGE) ×2 IMPLANT
SYR 5ML LL (SYRINGE) IMPLANT
TAPE CLOTH SURG 4X10 WHT LF (GAUZE/BANDAGES/DRESSINGS) ×2 IMPLANT
TOWEL OR 17X24 6PK STRL BLUE (TOWEL DISPOSABLE) ×2 IMPLANT
TOWEL OR 17X26 10 PK STRL BLUE (TOWEL DISPOSABLE) ×2 IMPLANT
WATER STERILE IRR 1000ML POUR (IV SOLUTION) ×2 IMPLANT

## 2013-06-17 NOTE — Anesthesia Preprocedure Evaluation (Signed)
Anesthesia Evaluation  Patient identified by MRN, date of birth, ID band Patient awake    Reviewed: Allergy & Precautions, H&P , NPO status , Patient's Chart, lab work & pertinent test results  Airway       Dental   Pulmonary former smoker,          Cardiovascular hypertension, + CAD and + Cardiac Stents     Neuro/Psych Anxiety  Neuromuscular disease    GI/Hepatic GERD-  ,  Endo/Other    Renal/GU ARFRenal disease     Musculoskeletal   Abdominal   Peds  Hematology  (+) Blood dyscrasia, ,   Anesthesia Other Findings   Reproductive/Obstetrics                           Anesthesia Physical Anesthesia Plan  ASA: III  Anesthesia Plan: General   Post-op Pain Management:    Induction: Intravenous  Airway Management Planned: Oral ETT  Additional Equipment:   Intra-op Plan:   Post-operative Plan: Extubation in OR  Informed Consent: I have reviewed the patients History and Physical, chart, labs and discussed the procedure including the risks, benefits and alternatives for the proposed anesthesia with the patient or authorized representative who has indicated his/her understanding and acceptance.     Plan Discussed with: CRNA, Anesthesiologist and Surgeon  Anesthesia Plan Comments:         Anesthesia Quick Evaluation

## 2013-06-17 NOTE — H&P (Signed)
[name] is an 72 y.o. male.   Chief Complaint: right leg pain HPI: patient seen in my office with sudden onset of right leg pain with radiation to the knee , associated with weakness. He fell twice in the past few days,. Medications are of no help. Mri was done and in view of his physical findings he taken to surgery today  Past Medical History  Diagnosis Date  . Hyperlipidemia   . Small bowel problem     HAD ALOT OF SCAR TISSUE FROM PREVIOUS SURGERIES..NG WAS INSERTED ...Marland KitchenMarland KitchenNO SURGERY NEEDED...Marland KitchenMarland KitchenIN FOR 8 DAYS  . Disorder of blood     BEEN TREATED BY DERMATOLOGIST X 4 YRS..."BLOOD BLISTERS"  . Arthritis   . Gout   . Anxiety   . Hx MRSA infection     rt shoulder  . Pneumonia     "I've had it 3-4 times"  . Coronary artery disease     IN 2000   STENT PLACED IN 2012 sees Dr. Dietrich Pates, saw last 2013  . HTN (hypertension)     sees [name] in [place]  . Small bowel obstruction   . Ventral hernia   . Acute renal failure 04/17/2013    Past Surgical History  Procedure Laterality Date  . Sternal surg  2000    HAS HAD 5-6 ON HIS STERNUM; "caught MRSA in it"  . Lumbar laminectomy/decompression microdiscectomy  06/13/2011    Procedure: LUMBAR LAMINECTOMY/DECOMPRESSION MICRODISCECTOMY;  Surgeon: Karn Cassis;  Location: [facility];  Service: Neurosurgery;  Laterality: N/A;  Lumbar three, lumbar four-five Laminectomy  . Eye surgery      bilateral cataract  . Anterior cervical corpectomy  12/17/11  . Incision and drainage of wound  ~ 20ll; 12/03/11    "had infection in my right"  . Shoulder open rotator cuff repair  ~ 2011    right  . Peripherally inserted central catheter insertion  2011 & 11/2011  . Cataract extraction w/ intraocular lens  implant, bilateral  ? 2011  . Coronary artery bypass graft  2000    CABG X5  . Back surgery      lumbar  . Tonsillectomy  1949  . Cholecystectomy  2006 "or after"  . Coronary angioplasty with stent placement  2012  . Anterior  cervical corpectomy  12/17/2011    Procedure: ANTERIOR CERVICAL CORPECTOMY;  Surgeon: Karn Cassis, MD;  Location: [facility];  Service: Neurosurgery;  Laterality: N/A;  Anterior Cervical Decompression Fusion Five to Thoracic Two with plating    Family History  Problem Relation Age of Onset  . Heart disease    . Hypertension Mother   . Hypertension Maternal Aunt   . Hypertension Maternal Uncle   . Anesthesia problems Neg Hx   . Hypotension Neg Hx   . Malignant hyperthermia Neg Hx   . Pseudochol deficiency Neg Hx    Social History:  reports that he quit smoking about 14 years ago. His smoking use included Cigarettes. He has a 1 pack-year smoking history. He has never used smokeless tobacco. He reports that he does not drink alcohol or use illicit drugs.  Allergies:  Allergies  Allergen Reactions  . Morphine Other (See Comments)    REACTION: made him go crazy; "I had fun w/it; my family didn't think it was too funny"  . Metoprolol Other (See Comments)    REACTION:  Tachycardia; "don't remember how bad it was; it was so long ago"    Medications Prior to  Admission  Medication Sig Dispense Refill  . atorvastatin (LIPITOR) 40 MG tablet Take 1 tablet (40 mg total) by mouth daily.  90 tablet  2  . citalopram (CELEXA) 40 MG tablet Take 40 mg by mouth daily.      . Cyanocobalamin (VITAMIN B 12 PO) Take 1 tablet by mouth daily.      Marland Kitchen doxazosin (CARDURA) 4 MG tablet Take 8 mg by mouth at bedtime.      Marland Kitchen oxyCODONE (ROXICODONE) 15 MG immediate release tablet Take 7.5 mg by mouth every 4 (four) hours as needed. For pain      . predniSONE (DELTASONE) 10 MG tablet Take 20 mg by mouth daily.       . vitamin C (ASCORBIC ACID) 500 MG tablet Take 500 mg by mouth daily.      Marland Kitchen VITAMIN D, CHOLECALCIFEROL, PO Take 2-3 tablets by mouth daily.      Marland Kitchen VITAMIN E PO Take 2-3 tablets by mouth daily.      . nitroGLYCERIN (NITROSTAT) 0.4 MG SL tablet Place 0.4 mg under the tongue every 5 (five) minutes as  needed. For chest pain        Results for orders placed during the hospital encounter of 06/17/13 (from the past 48 hour(s))  BASIC METABOLIC PANEL     Status: Abnormal   Collection Time    06/17/13 12:07 PM      Result Value Range   Sodium 138  135 - 145 mEq/L   Potassium 4.3  3.5 - 5.1 mEq/L   Chloride 102  96 - 112 mEq/L   CO2 25  19 - 32 mEq/L   Glucose, Bld 113 (*) 70 - 99 mg/dL   BUN 22  6 - 23 mg/dL   Creatinine, Ser 1.30 (*) 0.50 - 1.35 mg/dL   Calcium 9.2  8.4 - 86.5 mg/dL   GFR calc non Af Amer 51 (*) >90 mL/min   GFR calc Af Amer 59 (*) >90 mL/min   Comment: (NOTE)     The eGFR has been calculated using the CKD EPI equation.     This calculation has not been validated in all clinical situations.     eGFR's persistently <90 mL/min signify possible Chronic Kidney     Disease.  CBC     Status: Abnormal   Collection Time    06/17/13 12:07 PM      Result Value Range   WBC 7.1  4.0 - 10.5 K/uL   RBC 3.80 (*) 4.22 - 5.81 MIL/uL   Hemoglobin 11.7 (*) 13.0 - 17.0 g/dL   HCT 78.4 (*) 69.6 - 29.5 %   MCV 89.5  78.0 - 100.0 fL   MCH 30.8  26.0 - 34.0 pg   MCHC 34.4  30.0 - 36.0 g/dL   RDW 28.4  13.2 - 44.0 %   Platelets 214  150 - 400 K/uL   No results found.  Review of Systems  Constitutional: Negative.   Eyes: Negative.   Cardiovascular: Negative.   Gastrointestinal: Negative.   Genitourinary: Negative.   Musculoskeletal: Positive for back pain, joint pain and neck pain.  Skin: Negative.   Neurological: Positive for sensory change and focal weakness.  Endo/Heme/Allergies: Negative.   Psychiatric/Behavioral: Negative.     Blood pressure 154/73, temperature 97.6 F (36.4 C), temperature source Oral, resp. rate 18, height 6' (1.829 m), weight 86.183 kg (190 lb), SpO2 97.00%. Physical Exam hent, nl. Neck, anterior scar. Cv, nl. Lugs, clear. Abdomen, soft.  Extremities, tendermness and scar in right shoulder. NEURO severe weakneass of ileo-psoas and quadriceps.  Sensory changes at right l3 dermatome. Mri shows fusion at l45, 51. At l3-4 he has a large hnp with displacement of the thecal sac  Assessment/Plan Patient to go ahead with a right l3-4 discectomy. Patient aware of risks and benefits  Beyla Loney M 06/17/2013, 1:32 PM

## 2013-06-17 NOTE — Transfer of Care (Signed)
Immediate Anesthesia Transfer of Care Note  Patient: [name]  Procedure(s) Performed: Procedure(s) with comments: LUMBAR LAMINECTOMY/DECOMPRESSION MICRODISCECTOMY 1 LEVEL (Right) - Right L3-4 Microdiskectomy  Patient Location: PACU  Anesthesia Type:General  Level of Consciousness: awake, alert  and oriented  Airway & Oxygen Therapy: Patient Spontanous Breathing and Patient connected to nasal cannula oxygen  Post-op Assessment: Report given to PACU RN, Post -op Vital signs reviewed and stable and Patient moving all extremities X 4  Post vital signs: Reviewed and stable  Complications: No apparent anesthesia complications

## 2013-06-17 NOTE — Preoperative (Signed)
Beta Blockers   Reason not to administer Beta Blockers:Not Applicable 

## 2013-06-17 NOTE — Progress Notes (Signed)
Op note [phone]

## 2013-06-18 MED ORDER — GABAPENTIN 300 MG PO CAPS
300.0000 mg | ORAL_CAPSULE | Freq: Three times a day (TID) | ORAL | Status: DC
  Administered 2013-06-18 – 2013-06-22 (×12): 300 mg via ORAL
  Filled 2013-06-18 (×15): qty 1

## 2013-06-18 NOTE — Anesthesia Postprocedure Evaluation (Signed)
Anesthesia Post Note  Patient: [name]  Procedure(s) Performed: Procedure(s) (LRB): LUMBAR LAMINECTOMY/DECOMPRESSION MICRODISCECTOMY 1 LEVEL (Right)  Anesthesia type: general  Patient location: PACU  Post pain: Pain level controlled  Post assessment: Patient's Cardiovascular Status Stable  Last Vitals:  Filed Vitals:   06/18/13 0544  BP: 138/63  Pulse: 69  Temp: 36.6 C  Resp: 16    Post vital signs: Reviewed and stable  Level of consciousness: sedated  Complications: No apparent anesthesia complications

## 2013-06-18 NOTE — Care Management Note (Unsigned)
    Page 1 of 1   06/18/2013     1:14:23 PM   CARE MANAGEMENT NOTE 06/18/2013  Patient:  [name], [name]   Account Number:  [id number]  Date Initiated:  06/18/2013  Documentation initiated by:  Letha Cape  Subjective/Objective Assessment:   dx s/p l3-4 discectomy     Action/Plan:   Anticipated DC Date:  06/19/2013   Anticipated DC Plan:  HOME W HOME HEALTH SERVICES         Choice offered to / List presented to:             Status of service:  In process, will continue to follow Medicare Important Message given?   (If response is "NO", the following Medicare IM given date fields will be blank) Date Medicare IM given:   Date Additional Medicare IM given:    Discharge Disposition:    Per UR Regulation:    If discussed at Long Length of Stay Meetings, dates discussed:    Comments:  06/18/13 13:12 Letha Cape RN, BSN [phone] patient is s/p l3-4 discectomy, patient now with headache and on bedrest.  NCM will continue to follow for dc needs.

## 2013-06-18 NOTE — Progress Notes (Signed)
Subjective: Patient reports headache, hard to urinate with head of bed down  Objective: Vital signs in last 24 hours: Temp:  [97.3 F (36.3 C)-98 F (36.7 C)] 97.9 F (36.6 C) (11/22 0544) Pulse Rate:  [65-70] 69 (11/22 0544) Resp:  [13-24] 24 (11/22 0818) BP: (128-169)/(58-76) 138/63 mmHg (11/22 0544) SpO2:  [95 %-100 %] 99 % (11/22 0818) Weight:  [86.183 kg (190 lb)] 86.183 kg (190 lb) (11/21 1213)  Intake/Output from previous day: 11/21 0701 - 11/22 0700 In: 1800 [I.V.:1800] Out: 800 [Urine:600; Blood:200] Intake/Output this shift:    Physical Exam: Foot drop left, good function right lower extremity.  Dressing CDI, flat.  Lab Results:  Recent Labs  06/17/13 1207  WBC 7.1  HGB 11.7*  HCT 34.0*  PLT 214   BMET  Recent Labs  06/17/13 1207  NA 138  K 4.3  CL 102  CO2 25  GLUCOSE 113*  BUN 22  CREATININE 1.37*  CALCIUM 9.2    Studies/Results: Dg Chest 2 View  06/17/2013   CLINICAL DATA:  Hypertension.  EXAM: CHEST  2 VIEW  COMPARISON:  January 08, 2012.  FINDINGS: Status post coronary artery bypass graft. Status post surgical fusion of lower cervical spine. Cardiomediastinal silhouette appears normal. No pneumothorax is seen. There is continued blunting of the right costophrenic sulcus which is unchanged and most consistent with scarring. Stable small calcified granuloma is noted laterally in right lower lobe. No acute pulmonary disease is noted.  IMPRESSION: Stable chronic findings as described above. No acute cardiopulmonary abnormality seen.   Electronically Signed   By: Roque Lias M.D.   On: 06/17/2013 15:12   Dg Lumbar Spine 2-3 Views  06/17/2013   CLINICAL DATA:  Right L3-4 microdiscectomy.  EXAM: LUMBAR SPINE - 2-3 VIEW  COMPARISON:  06/16/2013  FINDINGS: First lateral intraoperative image demonstrates remote postsurgical changes are noted from posterior fusion at L4-S1. Localizing instrument is located at L1-2.  Second and 3rd lateral intraoperative  images demonstrates posterior surgical instruments directed at the L3 vertebral body.  IMPRESSION: Intraoperative imaging and localization as above.   Electronically Signed   By: Charlett Nose M.D.   On: 06/17/2013 19:03   Dg Lumbar Spine 1 View  06/17/2013   CLINICAL DATA:  Right-sided L3-4 microdiscectomy  EXAM: LUMBAR SPINE - 1 VIEW 6:20 p.m.  COMPARISON:  June 17, 2013 5:17 p.m.  FINDINGS: There is postsurgical fusion of L4 through S1. Localizing instrument is identified at L3-4.  IMPRESSION: Intraoperative imaging and localization as described.   Electronically Signed   By: Sherian Rein M.D.   On: 06/17/2013 19:31    Assessment/Plan: Flat bedrest per Dr. Jeral Fruit.  S/p lumbar discectomy L 34    LOS: 1 day    Dorian Heckle, MD 06/18/2013, 8:48 AM

## 2013-06-18 NOTE — Progress Notes (Signed)
PT Cancellation Note  Patient Details Name: [name] MRN: [id number] DOB: [birth year]   Cancelled Treatment:    Reason Eval/Treat Not Completed: Patient not medically ready. Pt on active bedrest orders, will need updated activity orders to proceed.   Fabio Asa 06/18/2013, 8:16 AM

## 2013-06-18 NOTE — Op Note (Signed)
NAMESINAI, MAHANY             ACCOUNT NO.:  [id number]  MEDICAL RECORD NO.:  [id number]  LOCATION:  [id number]                        FACILITY:  [facility]  PHYSICIAN:  Hilda Lias, M.D.   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  Status post fusion 4-5, 5-1.  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  Status post fusion 4-5, 5-1.  PROCEDURE:  Right L3-L4 foraminotomy, lysis of adhesions, removal of 3 large fragments compromising the takeoff of L3 nerve root.  Microscope.  SURGEON:  Hilda Lias, MD  ASSISTANT:  Kathaleen Maser. Pool, MD  CLINICAL HISTORY:  Mr. Bain is a gentleman, who in the past had lumbar fusion.  He was seen in my office last night with acute onset of pain going to the right leg.  MRI showed a large herniated disk at the level of L3-L4.  Surgery was advised.  Because of the weakness he was having up to the point he fell 3 times, we decided to go ahead with surgery today.  He knew the risk of the surgery including CSF leak, infection, need for further surgery which might require fusion.  DESCRIPTION OF PROCEDURE:  The patient was taken to the OR and he was positioned in a prone manner.  The back was cleaned with DuraPrep. Midline incision in the right side from L3-L4 was made.  Retraction was done laterally.  The patient because of previous surgery has quite a bit of adhesion.  We had to do at least 3 x-rays just to be sure that we were in the right area.  The difficult part of the surgery was to do the lysis of adhesions to be able to get into the floor of the spine.  I at least took 45 minutes to do decompression and to be able to retract the thecal sac.  The patient has quite a bit of adhesion up to the point that there was an area where we had some CSF coming  anterolaterally. Nevertheless, after retraction of the thecal sac, we found that indeed there was at least 3 fragment of disk compromising the L3 nerve root. They were removed without any problem.  Hemostasis was done with bipolar.  At the end, we had plenty of space not only for the thecal sac, but for the L3 and L4 nerve root.  Surgical glue was used into the area of durotomy.  Valsalva maneuver was negative.  Then, the wound was closed with different layer of Vicryl and the skin with nylon.         ______________________________ Hilda Lias, M.D.    EB/MEDQ  D:  06/17/2013  T:  06/18/2013  Job:  [id number]

## 2013-06-18 NOTE — Progress Notes (Signed)
Pt unable to void post op. Bladder scan revealed 400-500 cc. In & Out with 600 cc removed. Will continue to monitor. Messer, Ivin Booty RN

## 2013-06-18 NOTE — Progress Notes (Signed)
PT Cancellation Note  Patient Details Name: [name] MRN: [id number] DOB: 05/28/1941   Cancelled Treatment:     Patient not ready for evaluation at this time, Flat bedrest per Dr. Jeral Fruit. Will hold therapies for today.    Fabio Asa 06/18/2013, 12:42 PM Charlotte Crumb, PT DPT  [phone]

## 2013-06-18 NOTE — Progress Notes (Signed)
OT Cancellation Note  Patient Details Name: JONTA GASTINEAU MRN: [id number] DOB: 03-Dec-1940   Cancelled Treatment:    Reason Eval/Treat Not Completed: Other (comment) (pt currently on bed rest. Will need updated activity orders to proceed)  Earlie Raveling OTR/L [phone] 06/18/2013, 8:17 AM

## 2013-06-19 MED ORDER — PROMETHAZINE HCL 25 MG/ML IJ SOLN
25.0000 mg | Freq: Four times a day (QID) | INTRAMUSCULAR | Status: DC | PRN
  Administered 2013-06-19: 25 mg via INTRAVENOUS
  Administered 2013-06-20: 12.5 mg via INTRAVENOUS
  Filled 2013-06-19 (×2): qty 1

## 2013-06-19 NOTE — Progress Notes (Signed)
Postop day 2. Pain well controlled. Patient denies headache. Uncomfortable with bedrest.  Awake and alert. Oriented and appropriate. Left lower extremity weakness stable. Wound clean and dry. Chest and abdomen benign.  Continue bedrest and observation. Plan mobilization tomorrow. Left quadriceps weakness somewhat better than preop.

## 2013-06-19 NOTE — Progress Notes (Signed)
Throughout night pt complained of consistent numbness in bilateral lower extremeties. Good strength in right leg, able to lift off of bed. Left leg is able to lift off of bed, but left foot is absent for dorsiflexion and plantar flexion. Pt has hx of foot drop in left foot. Pt stated that he felt numbness bilaterally from upper thighs down to feet yesterday as well and did not feel catheter being placed yesterday.Pt also states that he cannot feel himself passing gas. When asked when pt first noticed symptom, pt unable to come up with exact day or time since surgery. Will report off to day shift RN. Salvadore Oxford, RN [phone] 06/19/13

## 2013-06-19 NOTE — Progress Notes (Signed)
Pt vomited large amount once more. While cleaning patient, RN noticed stool at rectum. RN digitally assisted patient with passing some of the stool. Soft in consistency, but patient was unable to push very much. Also stated that just like the catheter insertion previously he could barely feel RN's finger in rectum. These concerns had been discussed between patient and Dr. Jordan Likes this morning with no orders. Will pass along to night shift and have them monitor.

## 2013-06-19 NOTE — Progress Notes (Signed)
PT Cancellation Note  Patient Details Name: [name] MRN: [id number] DOB: [birth year]   Cancelled Treatment:    Reason Eval/Treat Not Completed: Patient not medically ready (remains on flat bedrest.  Will follow up 11/24)   Dennis Bast 06/19/2013, 10:00 AM

## 2013-06-19 NOTE — Progress Notes (Signed)
Pt vomited x2. Zofran given, pt still nauseous. Notified Dr. Jordan Likes. Orders received. Will continue to monitor.

## 2013-06-20 MED ORDER — TAMSULOSIN HCL 0.4 MG PO CAPS
0.8000 mg | ORAL_CAPSULE | Freq: Every day | ORAL | Status: DC
  Administered 2013-06-20 – 2013-06-22 (×3): 0.8 mg via ORAL
  Filled 2013-06-20 (×3): qty 2

## 2013-06-20 NOTE — Evaluation (Signed)
Physical Therapy Evaluation Patient Details Name: [name] MRN: [id number] DOB: 1941/01/14 Today's Date: 06/20/2013 Time: 0950-1020 PT Time Calculation (min): 30 min  PT Assessment / Plan / Recommendation History of Present Illness  pt presents with L4- S1 PLIF and post-op numbness and weakness.    Clinical Impression  Pt with very diminished sensation in peri area and Bil LEs.  Pt also without bowel control and unable to feel that he was having BM during mobility.  Pt very motivated and has lots of questions about present situation and prognosis.  Advised pt to discuss with MD.  Will continue to follow.      PT Assessment  Patient needs continued PT services    Follow Up Recommendations  CIR    Does the patient have the potential to tolerate intense rehabilitation      Barriers to Discharge        Equipment Recommendations  Rolling walker with 5" wheels    Recommendations for Other Services Rehab consult   Frequency Min 5X/week    Precautions / Restrictions Precautions Precautions: Back;Fall Precaution Booklet Issued: Yes (comment) Restrictions Weight Bearing Restrictions: No   Pertinent Vitals/Pain 10/10 with mobility.  RN arrived to medicate.        Mobility  Bed Mobility Bed Mobility: Rolling Left;Left Sidelying to Sit;Sitting - Scoot to [place] of Bed Rolling Left: 4: Min assist Left Sidelying to Sit: 1: +2 Total assist Left Sidelying to Sit: Patient Percentage: 20% Sitting - Scoot to Edge of Bed: 1: +1 Total assist Details for Bed Mobility Assistance: pt needs step by step cues for safe technique and encouragement.  pt very weak and with limited ability to A with mobility.   Transfers Transfers: Sit to Stand;Stand to Sit;Squat Pivot Transfers Sit to Stand: 1: +2 Total assist Sit to Stand: Patient Percentage: 40% Stand to Sit: 1: +2 Total assist Stand to Sit: Patient Percentage: 40% Squat Pivot Transfers: 1: +2 Total assist Squat Pivot Transfers:  Patient Percentage: 20% Details for Transfer Assistance: pt very weak and with limited ability to A with mobility.  pt indicates he is unable to feel his LEs on floor and unable to move the with transfer.  Repeated sit to stand for peri hygiene with pt remaining in squat position and unable to obtain fully standing.   Ambulation/Gait Ambulation/Gait Assistance: Not tested (comment) Stairs: No Wheelchair Mobility Wheelchair Mobility: No    Exercises     PT Diagnosis: Difficulty walking;Generalized weakness  PT Problem List: Decreased strength;Decreased activity tolerance;Decreased balance;Decreased mobility;Decreased coordination;Decreased knowledge of use of DME;Decreased knowledge of precautions;Pain PT Treatment Interventions: DME instruction;Gait training;Functional mobility training;Therapeutic activities;Therapeutic exercise;Neuromuscular re-education;Balance training;Patient/family education     PT Goals(Current goals can be found in the care plan section) Acute Rehab PT Goals Patient Stated Goal: go home PT Goal Formulation: With patient Time For Goal Achievement: 07/04/13 Potential to Achieve Goals: Good  Visit Information  Last PT Received On: 06/20/13 Assistance Needed: +2 PT/OT Co-Evaluation/Treatment: Yes History of Present Illness: pt presents with L4- S1 PLIF and post-op numbness and weakness.         Prior Functioning  Home Living Family/patient expects to be discharged to:: Private residence Living Arrangements: Spouse/significant other Available Help at Discharge: Family;Available 24 hours/day Type of Home: House Home Access: Stairs to enter [employer] of Steps: 1 Entrance Stairs-Rails: None Home Layout: One level Home Equipment: Walker - 2 wheels;Cane - single point;Bedside commode;Grab bars - tub/shower Prior Function Level of Independence: Independent Communication Communication:  No difficulties Dominant Hand: Left    Cognition   Cognition Arousal/Alertness: Awake/alert Behavior During Therapy: WFL for tasks assessed/performed Overall Cognitive Status: Within Functional Limits for tasks assessed    Extremity/Trunk Assessment Upper Extremity Assessment Upper Extremity Assessment: RUE deficits/detail;LUE deficits/detail RUE Sensation: decreased light touch LUE Sensation: decreased light touch Lower Extremity Assessment Lower Extremity Assessment: Defer to PT evaluation RLE Deficits / Details: pt with decreased strength throughout with DF grossly 2/5 and knee/hip 3-/5.  Overal decreased sensation up to hip level.   RLE Sensation: decreased light touch;decreased proprioception RLE Coordination: decreased fine motor;decreased gross motor LLE Deficits / Details: pt with decreased strength throughout with DF 2/5 and knee/hip 3-/5.  Overall decreased sensation up to hip level.   LLE Sensation: decreased light touch;decreased proprioception LLE Coordination: decreased fine motor;decreased gross motor   Balance Balance Balance Assessed: No  End of Session PT - End of Session Equipment Utilized During Treatment: Gait belt Activity Tolerance: Patient limited by fatigue;Patient limited by pain Patient left: in chair;with call bell/phone within reach;with nursing/sitter in room Nurse Communication: Mobility status  GP     [name], [place] [phone] 06/20/2013, 12:17 PM

## 2013-06-20 NOTE — Evaluation (Signed)
Occupational Therapy Evaluation Patient Details Name: [name] MRN: [id number] DOB: Aug 19, 1940 Today's Date: 06/20/2013 Time: 1027-2536 OT Time Calculation (min): 33 min  OT Assessment / Plan / Recommendation History of present illness pt presents with L4- S1 PLIF and post-op numbness and weakness.     Clinical Impression   Pt presents with below problem list. Pt without bowel control during session and unable to feel he was having BM. Pt independent with ADLs, PTA. Pt will benefit from acute OT to increase independence prior to d/c. Recommending CIR for additional rehab prior to d/c.     OT Assessment  Patient needs continued OT Services    Follow Up Recommendations  CIR;Supervision/Assistance - 24 hour    Barriers to Discharge      Equipment Recommendations  Other (comment) (tbd)    Recommendations for Other Services Rehab consult  Frequency  Min 2X/week    Precautions / Restrictions Precautions Precautions: Back;Fall Precaution Booklet Issued: Yes (comment) Restrictions Weight Bearing Restrictions: No   Pertinent Vitals/Pain Pain with mobility. RN arrived to medicate.     ADL  Eating/Feeding: Independent Where Assessed - Eating/Feeding: Chair Grooming: Set up;Supervision/safety Where Assessed - Grooming: Supported sitting Upper Body Bathing: Set up;Supervision/safety Where Assessed - Upper Body Bathing: Supported sitting Lower Body Bathing: +2 Total assistance Lower Body Bathing: Patient Percentage: 10% Where Assessed - Lower Body Bathing: Supported sit to stand Upper Body Dressing: Set up;Supervision/safety Where Assessed - Upper Body Dressing: Supported sitting Lower Body Dressing: +2 Total assistance Lower Body Dressing: Patient Percentage: 0% Where Assessed - Lower Body Dressing: Supported sit to Pharmacist, hospital: +2 Total assistance Toilet Transfer: Patient Percentage: 20% (40% for squat pivot) Toilet Transfer Method: Sit to stand;Squat  pivot Acupuncturist: Other (comment) (from bed to recliner chair) Toileting - Clothing Manipulation and Hygiene: +2 Total assistance Toileting - Clothing Manipulation and Hygiene: Patient Percentage: 0% Where Assessed - Toileting Clothing Manipulation and Hygiene: Other (comment) (sit to stand from recliner chair) Tub/Shower Transfer Method: Not assessed Equipment Used: Gait belt;Rolling walker;Reacher;Long-handled shoe horn;Long-handled sponge;Sock aid Transfers/Ambulation Related to ADLs: +2 Total A  ADL Comments: Educated on AE for LB ADLs. Pt requiring +2 total A for hygiene. Educated on toilet aid for hygiene.    OT Diagnosis: Acute pain  OT Problem List: Decreased strength;Decreased activity tolerance;Impaired balance (sitting and/or standing);Decreased knowledge of use of DME or AE;Decreased knowledge of precautions;Pain OT Treatment Interventions: Self-care/ADL training;DME and/or AE instruction;Therapeutic activities;Patient/family education;Balance training   OT Goals(Current goals can be found in the care plan section) Acute Rehab OT Goals Patient Stated Goal: go home OT Goal Formulation: With patient Time For Goal Achievement: 06/27/13 Potential to Achieve Goals: Good ADL Goals Pt Will Perform Grooming: with supervision;with set-up;standing Pt Will Perform Lower Body Bathing: with adaptive equipment;sit to/from stand;with min assist Pt Will Perform Lower Body Dressing: with min assist;with adaptive equipment;sit to/from stand Pt Will Transfer to Toilet: with min assist;stand pivot transfer;bedside commode Pt Will Perform Toileting - Clothing Manipulation and hygiene: with min assist;sitting/lateral leans;sit to/from stand Additional ADL Goal #1: Pt will independently verbalize 3/3 back precautions.  Additional ADL Goal #2: Pt will perform bed mobility at supervision level as precursor for ADLs.   Visit Information  Last OT Received On: 06/20/13 Assistance  Needed: +2 History of Present Illness: pt presents with L4- S1 PLIF and post-op numbness and weakness.         Prior Functioning     Home Living Family/patient expects to be discharged  to:: Private residence Living Arrangements: Spouse/significant other Available Help at Discharge: Family;Available 24 hours/day Type of Home: House Home Access: Stairs to enter [employer] of Steps: 1 Entrance Stairs-Rails: None Home Layout: One level Home Equipment: Walker - 2 wheels;Cane - single point;Bedside commode;Grab bars - tub/shower Prior Function Level of Independence: Independent Communication Communication: No difficulties Dominant Hand: Left         Vision/Perception Vision - History Baseline Vision: Wears glasses all the time   Cognition  Cognition Arousal/Alertness: Awake/alert Behavior During Therapy: WFL for tasks assessed/performed Overall Cognitive Status: Within Functional Limits for tasks assessed    Extremity/Trunk Assessment Upper Extremity Assessment Upper Extremity Assessment: RUE deficits/detail;LUE deficits/detail RUE Sensation: decreased light touch LUE Sensation: decreased light touch Lower Extremity Assessment Lower Extremity Assessment: Defer to PT evaluation RLE Deficits / Details: pt with decreased strength throughout with DF grossly 2/5 and knee/hip 3-/5.  Overal decreased sensation up to hip level.   RLE Sensation: decreased light touch;decreased proprioception RLE Coordination: decreased fine motor;decreased gross motor LLE Deficits / Details: pt with decreased strength throughout with DF 2/5 and knee/hip 3-/5.  Overall decreased sensation up to hip level.   LLE Sensation: decreased light touch;decreased proprioception LLE Coordination: decreased fine motor;decreased gross motor     Mobility Bed Mobility Bed Mobility: Rolling Left;Left Sidelying to Sit;Sitting - Scoot to [employer] of Bed Rolling Left: 4: Min assist Left Sidelying to Sit:  1: +2 Total assist Left Sidelying to Sit: Patient Percentage: 20% Sitting - Scoot to Edge of Bed: 1: +1 Total assist Details for Bed Mobility Assistance: pt needs step by step cues for safe technique and encouragement.  pt very weak and with limited ability to A with mobility.   Transfers Transfers: Sit to Stand;Stand to Sit Sit to Stand: 1: +2 Total assist;With upper extremity assist;From bed;From chair/3-in-1 Sit to Stand: Patient Percentage: 40% Stand to Sit: 1: +2 Total assist;To chair/3-in-1 Stand to Sit: Patient Percentage: 40% Details for Transfer Assistance: pt very weak and with limited ability to A with mobility.  pt indicates he is unable to feel his LEs on floor and unable to move the with transfer.  Repeated sit to stand for peri hygiene with pt remaining in squat position and unable to obtain fully standing.       Exercise        End of Session OT - End of Session Equipment Utilized During Treatment: Gait belt;Rolling walker Activity Tolerance: Patient limited by pain Patient left: in chair;with call bell/phone within reach;with nursing/sitter in room  GO      Earlie Raveling OTR/L [phone] 06/20/2013, 12:54 PM

## 2013-06-20 NOTE — Progress Notes (Signed)
Patient ID: [name], male   DOB: 11/08/1940, 72 y.o.   MRN: [id number] Less incisional pain,weakness better than preop. Some numbness right leg . Wound dry, flat. Rehabilitation to see, spoke with wife by phone

## 2013-06-20 NOTE — Progress Notes (Signed)
Rehab Admissions Coordinator Note:  Patient was screened by Clois Dupes for appropriateness for an Inpatient Acute Rehab Consult. INpatient rehab consult is pending today and Roderic Palau will follow. [phone].  Clois Dupes 06/20/2013, 1:46 PM  I can be reached at [phone].

## 2013-06-20 NOTE — Consult Note (Signed)
Physical Medicine and Rehabilitation Consult  Reason for Consult: HNP L3/4 with RLE weakness, BLE sensory deficits as well as neurogenic B/B symptoms.  Referring Physician: Dr. Jeral Fruit.    HPI: [name] is a 72 y.o. male with history of CAD, HTN, SBO, left foot drop, RLE pain with radiation to the knee and falls due to severe weakness of ileo-psoas as well as quadriceps muscle. 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 Jeral Fruit. Post op on with headaches and on bedrest for minimal CSF leak till 06/20/13.  RLE weakness decreasing with decrease in back pain. Therapies initiated yesterday and patient with poor posture with inability to activate BLE due to sensory deficits as well as incontinent of bladder and bowel (lack of sensation reported). MD, PT, OT recommending CIR.   Has paresthesias in the right lower extremity which he states were not present preoperatively. 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   Respiratory: Negative.   Cardiovascular: Negative.   Gastrointestinal:       Incont  Genitourinary:       Insensate,incont  Musculoskeletal: Positive for back pain and joint pain.  Skin: Positive for itching.  Neurological: Positive for sensory change, focal weakness and weakness.  Endo/Heme/Allergies: Negative.   Psychiatric/Behavioral: Negative.     Past Medical History  Diagnosis Date  . Hyperlipidemia   . Small bowel problem     HAD ALOT OF SCAR TISSUE FROM PREVIOUS SURGERIES..NG WAS INSERTED ...Marland KitchenMarland KitchenNO SURGERY NEEDED...Marland KitchenMarland KitchenIN FOR 8 DAYS  . Disorder of blood     BEEN TREATED BY DERMATOLOGIST X 4 YRS..."BLOOD BLISTERS"  .  Arthritis   . Gout   . Anxiety   . Hx MRSA infection     rt shoulder  . Pneumonia     "I've had it 3-4 times"  . Coronary artery disease     IN 2000   STENT PLACED IN 2012 sees Dr. Dietrich Pates, saw last 2013  . HTN (hypertension)     sees [name] in [place]  . Small bowel obstruction   . Ventral hernia   . Acute renal failure 04/17/2013    Past Surgical History  Procedure Laterality Date  . Sternal surg  2000    HAS HAD 5-6 ON HIS STERNUM; "caught MRSA in it"  . Lumbar laminectomy/decompression microdiscectomy  06/13/2011    Procedure: LUMBAR LAMINECTOMY/DECOMPRESSION MICRODISCECTOMY;  Surgeon: Karn Cassis;  Location: [facility];  Service: Neurosurgery;  Laterality: N/A;  Lumbar three, lumbar four-five Laminectomy  . Eye surgery      bilateral cataract  . Anterior cervical corpectomy  12/17/11  . Incision and drainage of wound  ~ 20ll; 12/03/11    "had infection in my right"  . Shoulder open rotator cuff repair  ~ 2011    right  . Peripherally inserted central catheter insertion  2011 & 11/2011  . Cataract extraction w/ intraocular lens  implant, bilateral  ? 2011  . Coronary artery bypass graft  2000    CABG X5  . Back surgery      lumbar  . Tonsillectomy  1949  . Cholecystectomy  2006 "or  after"  . Coronary angioplasty with stent placement  2012  . Anterior cervical corpectomy  12/17/2011    Procedure: ANTERIOR CERVICAL CORPECTOMY;  Surgeon: Karn Cassis, MD;  Location: [facility];  Service: Neurosurgery;  Laterality: N/A;  Anterior Cervical Decompression Fusion Five to Thoracic Two with plating    Family History  Problem Relation Age of Onset  . Heart disease    . Hypertension Mother   . Hypertension Maternal Aunt   . Hypertension Maternal Uncle   . Anesthesia problems Neg Hx   . Hypotension Neg Hx   . Malignant hyperthermia Neg Hx   . Pseudochol deficiency Neg Hx    Social History:  Per reports that he quit smoking about 14 years ago. His smoking  use included Cigarettes. He has a 1 pack-year smoking history. He has never used smokeless tobacco. Per  reports that he does not drink alcohol or use illicit drugs.   Allergies  Allergen Reactions  . Morphine Other (See Comments)    REACTION: made him go crazy; "I had fun w/it; my family didn't think it was too funny"  . Metoprolol Other (See Comments)    REACTION:  Tachycardia; "don't remember how bad it was; it was so long ago"    Medications Prior to Admission  Medication Sig Dispense Refill  . atorvastatin (LIPITOR) 40 MG tablet Take 1 tablet (40 mg total) by mouth daily.  90 tablet  2  . citalopram (CELEXA) 40 MG tablet Take 40 mg by mouth daily.      . Cyanocobalamin (VITAMIN B 12 PO) Take 1 tablet by mouth daily.      Marland Kitchen doxazosin (CARDURA) 4 MG tablet Take 8 mg by mouth at bedtime.      Marland Kitchen oxyCODONE (ROXICODONE) 15 MG immediate release tablet Take 7.5 mg by mouth every 4 (four) hours as needed. For pain      . predniSONE (DELTASONE) 10 MG tablet Take 20 mg by mouth daily.       . vitamin C (ASCORBIC ACID) 500 MG tablet Take 500 mg by mouth daily.      Marland Kitchen VITAMIN D, CHOLECALCIFEROL, PO Take 2-3 tablets by mouth daily.      Marland Kitchen VITAMIN E PO Take 2-3 tablets by mouth daily.      . nitroGLYCERIN (NITROSTAT) 0.4 MG SL tablet Place 0.4 mg under the tongue every 5 (five) minutes as needed. For chest pain        Home: Home Living Family/patient expects to be discharged to:: Private residence Living Arrangements: Spouse/significant other Available Help at Discharge: Family;Available 24 hours/day Type of Home: House Home Access: Stairs to enter [employer] of Steps: 1 Entrance Stairs-Rails: None Home Layout: One level Home Equipment: Walker - 2 wheels;Cane - single point;Bedside commode;Grab bars - tub/shower  Functional History:   Functional Status:  Mobility: Bed Mobility Bed Mobility: Rolling Left;Left Sidelying to Sit;Sitting - Scoot to [employer] of Bed Rolling Left: 4:  Min assist Left Sidelying to Sit: 1: +2 Total assist Left Sidelying to Sit: Patient Percentage: 20% Sitting - Scoot to Edge of Bed: 1: +1 Total assist Transfers Transfers: Sit to Stand;Stand to Sit;Squat Pivot Transfers Sit to Stand: 1: +2 Total assist;With upper extremity assist;From bed;From chair/3-in-1 Sit to Stand: Patient Percentage: 40% Stand to Sit: 1: +2 Total assist;To chair/3-in-1 Stand to Sit: Patient Percentage: 40% Squat Pivot Transfers: 1: +2 Total assist Squat Pivot Transfers: Patient Percentage: 20% Ambulation/Gait Ambulation/Gait Assistance: Not tested (comment) Stairs: No Wheelchair Mobility  Wheelchair Mobility: No  ADL: ADL Eating/Feeding: Independent Where Assessed - Eating/Feeding: Chair Grooming: Set up;Supervision/safety Where Assessed - Grooming: Supported sitting Upper Body Bathing: Set up;Supervision/safety Where Assessed - Upper Body Bathing: Supported sitting Lower Body Bathing: +2 Total assistance Where Assessed - Lower Body Bathing: Supported sit to stand Upper Body Dressing: Set up;Supervision/safety Where Assessed - Upper Body Dressing: Supported sitting Lower Body Dressing: +2 Total assistance Where Assessed - Lower Body Dressing: Supported sit to Pharmacist, hospital: +2 Total assistance Toilet Transfer Method: Sit to stand;Squat pivot Acupuncturist: Other (comment) (from bed to recliner chair) Tub/Shower Transfer Method: Not assessed Equipment Used: Gait belt;Rolling walker;Reacher;Long-handled shoe horn;Long-handled sponge;Sock aid Transfers/Ambulation Related to ADLs: +2 Total A  ADL Comments: Educated on AE for LB ADLs. Pt requiring +2 total A for hygiene. Educated on toilet aid for hygiene.  Cognition: Cognition Overall Cognitive Status: Within Functional Limits for tasks assessed Orientation Level: Oriented X4 Cognition Arousal/Alertness: Awake/alert Behavior During Therapy: WFL for tasks assessed/performed Overall  Cognitive Status: Within Functional Limits for tasks assessed  Blood pressure 162/61, pulse 73, temperature 98.1 F (36.7 C), temperature source Oral, resp. rate 18, height 6' (1.829 m), weight 86.183 kg (190 lb), SpO2 95.00%. Physical Exam  Nursing note and vitals reviewed. Constitutional: He is oriented to person, place, and time. He appears well-developed and well-nourished.  HENT:  Head: Normocephalic and atraumatic.  Eyes: Conjunctivae and EOM are normal. Pupils are equal, round, and reactive to light.  Neck: Normal range of motion.  Cardiovascular: Normal rate, regular rhythm, normal heart sounds and intact distal pulses.   Respiratory: Effort normal and breath sounds normal.  GI: Soft. Bowel sounds are normal.  Genitourinary:  foley  Neurological: He is alert and oriented to person, place, and time. A sensory deficit is present.  Decreased S1 dermatomes sensation bilateral Motor strength is 3 minus right deltoid for at left deltoid For bilateral bicep triceps and grip 3 minus hip flexors bilateral knee extensors right ankle dorsiflexor and plantar flexor 0/5 left ankle dorsiflexor and plantar flexor  Skin: Skin is warm and dry.  Psychiatric: He has a normal mood and affect.    No results found for this or any previous visit (from the past 24 hour(s)). No results found.  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 Does the need for close, 24 hr/day medical supervision in concert with the patient's rehab needs make it unreasonable for this patient to be served in a less intensive setting? Yes 2. 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 multilevel radiculopathy, rotator cuff tear arthropathy of the right shoulder, history of partial sternotomy for mediastinitis 3. Due to bladder management, bowel management, safety, skin/wound care,  disease management, medication administration, pain management and patient education, does the patient require 24 hr/day rehab nursing? Yes 4. Does the patient require coordinated care of a physician, rehab nurse, PT (1-2 hrs/day, 5 days/week) and OT (1-2 hrs/day, 5 days/week) to address physical and functional deficits in the context of the above medical diagnosis(es)? Yes Addressing deficits in the following areas: balance, endurance, locomotion, strength, transferring, bowel/bladder control, bathing, dressing and toileting 5. Can the patient actively participate in an intensive therapy program of at least 3 hrs of therapy per day at least 5 days per week? Yes 6. The potential for patient to make measurable gains while on inpatient rehab is good 7. Anticipated functional outcomes upon discharge from inpatient rehab are  supervision to min assist with PT, supervision to min assist with OT, NA with SLP. 8. Estimated rehab length of stay to reach the above functional goals is: 10-12 days 9. Does the patient have adequate social supports to accommodate these discharge functional goals? Yes 10. Anticipated D/C setting: Home 11. Anticipated post D/C treatments: HH therapy 12. Overall Rehab/Functional Prognosis: good  RECOMMENDATIONS: This patient's condition is appropriate for continued rehabilitative care in the following setting: CIR Patient has agreed to participate in recommended program. Yes Note that insurance prior authorization may be required for reimbursement for recommended care.  Comment:     06/21/2013

## 2013-06-20 NOTE — Progress Notes (Signed)
UR complete.  Tinea Nobile RN, MSN 

## 2013-06-20 NOTE — Progress Notes (Signed)
Pt's lower extremities are very weak. Pt still having numbness in BLE. Pt is unaware when he is having a bowel movement and is unable to feel when he needs to urinate. Pt refuses to have Foley taken out at this time because of his unawareness. Risks of having Foley not D/C'd was given to Pt and he still refuses to let me remove Foley at this time. I will continue to monitor Pt. Rema Fendt, RN

## 2013-06-21 ENCOUNTER — Encounter (HOSPITAL_COMMUNITY): Payer: Self-pay | Admitting: Neurosurgery

## 2013-06-21 DIAGNOSIS — IMO0002 Reserved for concepts with insufficient information to code with codable children: Secondary | ICD-10-CM | POA: Diagnosis not present

## 2013-06-21 MED ORDER — TAMSULOSIN HCL 0.4 MG PO CAPS: 0.8000 mg | ORAL_CAPSULE | Freq: Every day | ORAL | Status: DC

## 2013-06-21 NOTE — Evaluation (Signed)
Physical Therapy Evaluation Patient Details Name: [name] MRN: [id number] DOB: 1941/02/22 Today's Date: 06/21/2013 Time: 4098-1191 PT Time Calculation (min): 23 min  PT Assessment / Plan / Recommendation History of Present Illness  pt presents with L4- S1 PLIF and post-op numbness and weakness.    Clinical Impression  Pt very motivated and showing some improvement today.  Pt wearing L AFO for mobility and indicates he has worn it since a previous back surgery.  Pt with lots of questions about prognosis, deferred these to MD.  Continue to feel CIR best D/C option at this time.      PT Assessment       Follow Up Recommendations  CIR    Does the patient have the potential to tolerate intense rehabilitation      Barriers to Discharge        Equipment Recommendations  Rolling walker with 5" wheels    Recommendations for Other Services     Frequency Min 5X/week    Precautions / Restrictions Precautions Precautions: Back;Fall Precaution Comments: Reviewed back precautions.   Restrictions Weight Bearing Restrictions: No   Pertinent Vitals/Pain Back pain 5/10.  Premedicated.        Mobility  Bed Mobility Bed Mobility: Rolling Left;Left Sidelying to Sit;Sitting - Scoot to [employer] of Bed Rolling Left: 4: Min assist;With rail Left Sidelying to Sit: 3: Mod assist Sitting - Scoot to Edge of Bed: 3: Mod assist Details for Bed Mobility Assistance: pt improved mobility, still requiring A for LEs and trunk to come to sit.   Transfers Transfers: Sit to Stand;Stand to Sit Sit to Stand: 1: +2 Total assist;With upper extremity assist;From bed Sit to Stand: Patient Percentage: 50% Stand to Sit: 1: +2 Total assist;With upper extremity assist;To chair/3-in-1 Stand to Sit: Patient Percentage: 40% Details for Transfer Assistance: pt again very weak in LEs with minimal control.  pt with decreased proprioception and sensation.   Ambulation/Gait Ambulation/Gait Assistance: 1: +2 Total  assist Ambulation/Gait: Patient Percentage: 30% Ambulation Distance (Feet): 3 Feet Assistive device: Rolling walker Ambulation/Gait Assistance Details: 2 person A for balance and WBing.  pt with scissoring gait and ataxic like movements.  pt wearing L AFO today and indicates he has worn this since a previous back surgery.   Gait Pattern: Step-through pattern;Decreased stride length;Decreased dorsiflexion - right;Decreased dorsiflexion - left;Scissoring;Ataxic;Narrow base of support Stairs: No Wheelchair Mobility Wheelchair Mobility: No    Exercises     PT Diagnosis:    PT Problem List:   PT Treatment Interventions:       PT Goals(Current goals can be found in the care plan section) Acute Rehab PT Goals Time For Goal Achievement: 07/04/13 Potential to Achieve Goals: Good  Visit Information  Last PT Received On: 06/21/13 Assistance Needed: +2 History of Present Illness: pt presents with L4- S1 PLIF and post-op numbness and weakness.         Prior Functioning       Cognition  Cognition Arousal/Alertness: Awake/alert Behavior During Therapy: WFL for tasks assessed/performed Overall Cognitive Status: Within Functional Limits for tasks assessed    Extremity/Trunk Assessment     Balance Balance Balance Assessed: No  End of Session PT - End of Session Equipment Utilized During Treatment: Gait belt (L AFO) Activity Tolerance: Patient tolerated treatment well Patient left: in chair;with call bell/phone within reach Nurse Communication: Mobility status  GP     [name], [name] [phone] 06/21/2013, 2:32 PM

## 2013-06-21 NOTE — Progress Notes (Signed)
Patient ID: [name], male   DOB: 11/29/1940, 72 y.o.   MRN: [id number] No pain as preop ,able to lift right leg but decrease of sensation with poor coordination both legs, changes in left leg are old. Had a bm. Wants foley in place till he is more active . Rehabilitation to see

## 2013-06-21 NOTE — Progress Notes (Signed)
  Clinical Social Work Department BRIEF PSYCHOSOCIAL ASSESSMENT 06/21/2013  Patient:  [name], [name]     Account Number:  [id number]     Admit date:  06/17/2013  Clinical Social Worker:  Leron Croak, CLINICAL SOCIAL WORKER  Date/Time:  06/21/2013 05:44 AM  Referred by:  Physician  Date Referred:  06/20/2013 Referred for  SNF Placement   Other Referral:   Interview type:  Patient Other interview type:    PSYCHOSOCIAL DATA Living Status:  WIFE Admitted from facility:   Level of care:   Primary support name:  Pavel Gadd  [phone] Primary support relationship to patient:  SPOUSE Degree of support available:   Pt has some support from family but knows that he will need more intensive assistance    CURRENT CONCERNS Current Concerns  Post-Acute Placement   Other Concerns:    SOCIAL WORK ASSESSMENT / PLAN CSW met with the Pt at the bedside. CSW intorduced self and reason for the consult. Pt was alert and priented x4. Pt stated that he plans to go to CIR when ready for his rehab. CSW explained that there was also a recommendation that Pt's information be sent out to SNF's in orderfor Pt to receive rehab services.  Pt voiced understanding and gave permission for the CSW to search in the [facility]. Pt stated that his first choice would be [facility] and that his second would be [facility] in [place].   Assessment/plan status:  Information/Referral to [facility] Other assessment/ plan:   Information/referral to community resources:   CSW provided Pt with a SNF listing and will follow up with choice.    PATIENT'S/FAMILY'S RESPONSE TO PLAN OF CARE: Pt was appreciative for assistance.        Leron Croak [facility]  [address];  [phone] Phone: [phone]

## 2013-06-21 NOTE — Clinical Social Work Placement (Addendum)
Clinical Social Work Department CLINICAL SOCIAL WORK PLACEMENT NOTE 06/21/2013  Patient:  [name], [name]  Account Number:  [id number] Admit date:  06/17/2013  Clinical Social Worker:  Genelle Bal, LCSW  Date/time:  06/21/2013 03:19 PM  Clinical Social Work is seeking post-discharge placement for this patient at the following level of care:   SKILLED NURSING   (*CSW will update this form in Epic as items are completed)   06/21/2013  Patient/family provided with [facility] Department of Clinical Social Work's list of facilities offering this level of care within the geographic area requested by the patient (or if unable, by the patient's family).    Patient/family informed of their freedom to choose among providers that offer the needed level of care, that participate in Medicare, Medicaid or managed care program needed by the patient, have an available bed and are willing to accept the patient.    Patient/family informed of [facility]' ownership interest in [facility], as well as of the fact that they are under no obligation to receive care at this facility.  PASARR submitted to EDS on 06/21/2013 PASARR number received from EDS on 06/21/2013  FL2 transmitted to all facilities in geographic area requested by pt/family on  06/21/2013 FL2 transmitted to all facilities within larger geographic area on   Patient informed that his/her managed care company has contracts with or will negotiate with  certain facilities, including the following:     Patient/family informed of bed offers received:   Patient chooses bed at  Physician recommends and patient chooses bed at    Patient to be transferred to  on   Patient to be transferred to facility by   The following physician request were entered in Epic:   Additional Comments: 06/21/2013 - Patient stated their preference was [facility] and [facility] in [place].

## 2013-06-21 NOTE — Progress Notes (Signed)
Occupational Therapy Treatment Patient Details Name: [name] MRN: [id number] DOB: 31-Jul-1940 Today's Date: 06/21/2013 Time: 4098-1191 OT Time Calculation (min): 34 min  OT Assessment / Plan / Recommendation  History of present illness pt presents with L4- S1 PLIF and post-op numbness and weakness.     OT comments  Practiced with AE for LB dressing and performed toileting. Pt willing to work with therapy and motivated to get better. Recommending CIR for additional rehab.   Follow Up Recommendations  CIR;Supervision/Assistance - 24 hour    Barriers to Discharge       Equipment Recommendations  Other (comment) (tbd)    Recommendations for Other Services Rehab consult  Frequency Min 2X/week   Progress towards OT Goals Progress towards OT goals: Progressing toward goals  Plan Discharge plan remains appropriate    Precautions / Restrictions Precautions Precautions: Back;Fall Precaution Comments: Reviewed back precautions with pt. Restrictions Weight Bearing Restrictions: No   Pertinent Vitals/Pain No pain when asked at end of session.     ADL  Lower Body Dressing: Other (comment);Maximal assistance (sock, shoe, underwear) Where Assessed - Lower Body Dressing: Supported sitting Toilet Transfer: +2 Total assistance Toilet Transfer: Patient Percentage: 20% (20% for stand to sit and stand pivot; 30% for sit to stand) Toilet Transfer Method: Sit to stand;Stand pivot Toilet Transfer Equipment: Bedside commode Toileting - Clothing Manipulation and Hygiene: +2 Total assistance Toileting - Clothing Manipulation and Hygiene: Patient Percentage: 0% Where Assessed - Glass blower/designer Manipulation and Hygiene: Other (comment);Sit to stand from 3-in-1 or toilet Equipment Used: Gait belt;Rolling walker;Reacher;Long-handled sponge;Long-handled shoe horn;Sock aid; AFO on left foot Transfers/Ambulation Related to ADLs: +2 Total A 20% for stand to sit and stand pivot and 30% for sit to  stand transfer. ADL Comments: Practiced with AE for LB dressing. Pt requiring assistance from OT for this. Pt was incontinent of bowel in chair-transferred from chair to [facility] and pt had more bowel movement. +2 total A for hygiene. Pt did not pull up underwear over bottom (was soiled), but OT educated on different techniques he could do, such as leaning side to side or standing (not alone).    OT Diagnosis:    OT Problem List:   OT Treatment Interventions:     OT Goals(current goals can now be found in the care plan section) Acute Rehab OT Goals Patient Stated Goal: get better OT Goal Formulation: With patient Time For Goal Achievement: 06/27/13 Potential to Achieve Goals: Good ADL Goals Pt Will Perform Grooming: with supervision;with set-up;standing Pt Will Perform Lower Body Bathing: with adaptive equipment;sit to/from stand;with min assist Pt Will Perform Lower Body Dressing: with min assist;with adaptive equipment;sit to/from stand Pt Will Transfer to Toilet: with min assist;stand pivot transfer;bedside commode Pt Will Perform Toileting - Clothing Manipulation and hygiene: with min assist;sitting/lateral leans;sit to/from stand Additional ADL Goal #1: Pt will independently verbalize 3/3 back precautions.  Additional ADL Goal #2: Pt will perform bed mobility at supervision level as precursor for ADLs.   Visit Information  Last OT Received On: 06/21/13 Assistance Needed: +2 History of Present Illness: pt presents with L4- S1 PLIF and post-op numbness and weakness.      Subjective Data      Prior Functioning      Cognition  Cognition Arousal/Alertness: Awake/alert Behavior During Therapy: WFL for tasks assessed/performed Overall Cognitive Status: Within Functional Limits for tasks assessed    Mobility  Bed Mobility Bed Mobility: Sit to Supine Sit to Supine: 1: +2 Total assist Sit to  Supine: Patient Percentage: 50% Details for Bed Mobility Assistance: Assistance with legs. Pt  performed more of a sit to supine techique for bed mobility, but OT was educating on log rolling technique for bed mobility and explained to be sure to perform the log roll.  Transfers Transfers: Sit to Stand;Stand to Sit Sit to Stand: 1: +2 Total assist;With upper extremity assist;From chair/3-in-1 Sit to Stand: Patient Percentage: 30% Stand to Sit: 1: +2 Total assist;To bed;To chair/3-in-1 Stand to Sit: Patient Percentage: 20% Details for Transfer Assistance: Cues for hand placement. Pt very weak and decreased sensation in LE's.  +2 Total A (20%) for stand pivot transfer.    Exercises      Balance Balance Balance Assessed: No   End of Session OT - End of Session Equipment Utilized During Treatment: Gait belt;Rolling walker Activity Tolerance: Patient tolerated treatment well Patient left: in bed;with call bell/phone within reach  GO     Earlie Raveling OTR/L [phone] 06/21/2013, 2:54 PM

## 2013-06-22 ENCOUNTER — Encounter (HOSPITAL_COMMUNITY): Payer: Self-pay | Admitting: Physical Medicine and Rehabilitation

## 2013-06-22 ENCOUNTER — Inpatient Hospital Stay (HOSPITAL_COMMUNITY)
Admission: RE | Admit: 2013-06-22 | Discharge: 2013-07-08 | DRG: 945 | Disposition: A | Discharge status: Home Health | Payer: Medicare Other | Source: Intra-hospital | Attending: Physical Medicine & Rehabilitation | Admitting: Physical Medicine & Rehabilitation

## 2013-06-22 DIAGNOSIS — M216X9 Other acquired deformities of unspecified foot: Secondary | ICD-10-CM

## 2013-06-22 DIAGNOSIS — M109 Gout, unspecified: Secondary | ICD-10-CM

## 2013-06-22 DIAGNOSIS — Z961 Presence of intraocular lens: Secondary | ICD-10-CM

## 2013-06-22 DIAGNOSIS — Z8249 Family history of ischemic heart disease and other diseases of the circulatory system: Secondary | ICD-10-CM

## 2013-06-22 DIAGNOSIS — Z79899 Other long term (current) drug therapy: Secondary | ICD-10-CM

## 2013-06-22 DIAGNOSIS — N319 Neuromuscular dysfunction of bladder, unspecified: Secondary | ICD-10-CM

## 2013-06-22 DIAGNOSIS — K592 Neurogenic bowel, not elsewhere classified: Secondary | ICD-10-CM | POA: Diagnosis not present

## 2013-06-22 DIAGNOSIS — Z8614 Personal history of Methicillin resistant Staphylococcus aureus infection: Secondary | ICD-10-CM | POA: Diagnosis not present

## 2013-06-22 DIAGNOSIS — G8929 Other chronic pain: Secondary | ICD-10-CM | POA: Diagnosis not present

## 2013-06-22 DIAGNOSIS — G589 Mononeuropathy, unspecified: Secondary | ICD-10-CM

## 2013-06-22 DIAGNOSIS — IMO0002 Reserved for concepts with insufficient information to code with codable children: Secondary | ICD-10-CM

## 2013-06-22 DIAGNOSIS — Z9849 Cataract extraction status, unspecified eye: Secondary | ICD-10-CM

## 2013-06-22 DIAGNOSIS — Z5189 Encounter for other specified aftercare: Secondary | ICD-10-CM | POA: Diagnosis not present

## 2013-06-22 DIAGNOSIS — Z9089 Acquired absence of other organs: Secondary | ICD-10-CM | POA: Diagnosis not present

## 2013-06-22 DIAGNOSIS — K5900 Constipation, unspecified: Secondary | ICD-10-CM

## 2013-06-22 DIAGNOSIS — G834 Cauda equina syndrome: Secondary | ICD-10-CM

## 2013-06-22 DIAGNOSIS — B952 Enterococcus as the cause of diseases classified elsewhere: Secondary | ICD-10-CM | POA: Diagnosis not present

## 2013-06-22 DIAGNOSIS — I2510 Atherosclerotic heart disease of native coronary artery without angina pectoris: Secondary | ICD-10-CM | POA: Diagnosis not present

## 2013-06-22 DIAGNOSIS — F411 Generalized anxiety disorder: Secondary | ICD-10-CM

## 2013-06-22 DIAGNOSIS — N289 Disorder of kidney and ureter, unspecified: Secondary | ICD-10-CM | POA: Diagnosis not present

## 2013-06-22 DIAGNOSIS — Z87891 Personal history of nicotine dependence: Secondary | ICD-10-CM

## 2013-06-22 DIAGNOSIS — G8220 Paraplegia, unspecified: Secondary | ICD-10-CM

## 2013-06-22 DIAGNOSIS — E785 Hyperlipidemia, unspecified: Secondary | ICD-10-CM | POA: Diagnosis not present

## 2013-06-22 DIAGNOSIS — I10 Essential (primary) hypertension: Secondary | ICD-10-CM

## 2013-06-22 DIAGNOSIS — N390 Urinary tract infection, site not specified: Secondary | ICD-10-CM

## 2013-06-22 DIAGNOSIS — N400 Enlarged prostate without lower urinary tract symptoms: Secondary | ICD-10-CM

## 2013-06-22 DIAGNOSIS — Z9861 Coronary angioplasty status: Secondary | ICD-10-CM

## 2013-06-22 DIAGNOSIS — Z951 Presence of aortocoronary bypass graft: Secondary | ICD-10-CM

## 2013-06-22 MED ORDER — DOXAZOSIN MESYLATE 8 MG PO TABS
8.0000 mg | ORAL_TABLET | Freq: Every day | ORAL | Status: DC
  Administered 2013-06-22 – 2013-06-29 (×8): 8 mg via ORAL
  Filled 2013-06-22 (×10): qty 1

## 2013-06-22 MED ORDER — ALUM & MAG HYDROXIDE-SIMETH 200-200-20 MG/5ML PO SUSP: 30.0000 mL | ORAL | Status: DC | PRN

## 2013-06-22 MED ORDER — GUAIFENESIN-DM 100-10 MG/5ML PO SYRP: 5.0000 mL | ORAL_SOLUTION | Freq: Four times a day (QID) | ORAL | Status: DC | PRN

## 2013-06-22 MED ORDER — DIPHENHYDRAMINE HCL 12.5 MG/5ML PO ELIX: 12.5000 mg | ORAL_SOLUTION | Freq: Four times a day (QID) | ORAL | Status: DC | PRN

## 2013-06-22 MED ORDER — LIDOCAINE HCL 2 % EX GEL
CUTANEOUS | Status: DC | PRN
  Administered 2013-07-06 (×2): 5 via TOPICAL
  Filled 2013-06-22: qty 5

## 2013-06-22 MED ORDER — PHENOL 1.4 % MT LIQD
1.0000 | OROMUCOSAL | Status: DC | PRN
  Filled 2013-06-22: qty 177

## 2013-06-22 MED ORDER — TRAMADOL HCL 50 MG PO TABS
50.0000 mg | ORAL_TABLET | Freq: Four times a day (QID) | ORAL | Status: DC | PRN
  Administered 2013-06-23 – 2013-06-29 (×4): 50 mg via ORAL
  Filled 2013-06-22 (×4): qty 1

## 2013-06-22 MED ORDER — CITALOPRAM HYDROBROMIDE 40 MG PO TABS
40.0000 mg | ORAL_TABLET | Freq: Every day | ORAL | Status: DC
  Administered 2013-06-23 – 2013-07-04 (×12): 40 mg via ORAL
  Filled 2013-06-22 (×13): qty 1

## 2013-06-22 MED ORDER — FLEET ENEMA 7-19 GM/118ML RE ENEM: 1.0000 | ENEMA | Freq: Once | RECTAL | Status: DC

## 2013-06-22 MED ORDER — METHOCARBAMOL 500 MG PO TABS: 500.0000 mg | ORAL_TABLET | Freq: Four times a day (QID) | ORAL | Status: DC | PRN

## 2013-06-22 MED ORDER — MUPIROCIN 2 % EX OINT
1.0000 "application " | TOPICAL_OINTMENT | Freq: Two times a day (BID) | CUTANEOUS | Status: DC
  Administered 2013-06-22 – 2013-07-08 (×30): 1 via NASAL
  Filled 2013-06-22: qty 22

## 2013-06-22 MED ORDER — BISACODYL 10 MG RE SUPP
10.0000 mg | Freq: Every day | RECTAL | Status: DC
  Administered 2013-06-23 – 2013-07-08 (×17): 10 mg via RECTAL
  Filled 2013-06-22 (×16): qty 1

## 2013-06-22 MED ORDER — OXYCODONE HCL 5 MG PO TABS
5.0000 mg | ORAL_TABLET | ORAL | Status: DC | PRN
  Administered 2013-06-22 – 2013-07-03 (×34): 10 mg via ORAL
  Administered 2013-07-03: 5 mg via ORAL
  Administered 2013-07-03 – 2013-07-04 (×4): 10 mg via ORAL
  Administered 2013-07-04: 5 mg via ORAL
  Administered 2013-07-05 – 2013-07-08 (×11): 10 mg via ORAL
  Filled 2013-06-22 (×22): qty 2
  Filled 2013-06-22: qty 1
  Filled 2013-06-22 (×13): qty 2
  Filled 2013-06-22: qty 1
  Filled 2013-06-22 (×14): qty 2

## 2013-06-22 MED ORDER — PROCHLORPERAZINE MALEATE 5 MG PO TABS
5.0000 mg | ORAL_TABLET | Freq: Four times a day (QID) | ORAL | Status: DC | PRN
  Filled 2013-06-22: qty 2

## 2013-06-22 MED ORDER — MENTHOL 3 MG MT LOZG
1.0000 | LOZENGE | OROMUCOSAL | Status: DC | PRN
  Filled 2013-06-22: qty 9

## 2013-06-22 MED ORDER — PREDNISONE 20 MG PO TABS
20.0000 mg | ORAL_TABLET | Freq: Every day | ORAL | Status: DC
  Administered 2013-06-23: 20 mg via ORAL
  Filled 2013-06-22 (×2): qty 1

## 2013-06-22 MED ORDER — TAMSULOSIN HCL 0.4 MG PO CAPS
0.8000 mg | ORAL_CAPSULE | Freq: Every day | ORAL | Status: DC
  Administered 2013-06-23 – 2013-06-24 (×2): 0.8 mg via ORAL
  Filled 2013-06-22 (×3): qty 2

## 2013-06-22 MED ORDER — ATORVASTATIN CALCIUM 40 MG PO TABS
40.0000 mg | ORAL_TABLET | Freq: Every day | ORAL | Status: DC
  Administered 2013-06-22 – 2013-07-07 (×16): 40 mg via ORAL
  Filled 2013-06-22 (×17): qty 1

## 2013-06-22 MED ORDER — ACETAMINOPHEN 325 MG PO TABS
325.0000 mg | ORAL_TABLET | ORAL | Status: DC | PRN
  Administered 2013-06-23: 650 mg via ORAL
  Filled 2013-06-22: qty 2

## 2013-06-22 MED ORDER — PROCHLORPERAZINE EDISYLATE 5 MG/ML IJ SOLN
5.0000 mg | Freq: Four times a day (QID) | INTRAMUSCULAR | Status: DC | PRN
  Filled 2013-06-22: qty 2

## 2013-06-22 MED ORDER — GABAPENTIN 300 MG PO CAPS
300.0000 mg | ORAL_CAPSULE | Freq: Three times a day (TID) | ORAL | Status: DC
  Administered 2013-06-22 – 2013-07-08 (×47): 300 mg via ORAL
  Filled 2013-06-22 (×51): qty 1

## 2013-06-22 MED ORDER — ENOXAPARIN SODIUM 40 MG/0.4ML ~~LOC~~ SOLN
40.0000 mg | SUBCUTANEOUS | Status: DC
  Administered 2013-06-22 – 2013-07-07 (×16): 40 mg via SUBCUTANEOUS
  Filled 2013-06-22 (×17): qty 0.4

## 2013-06-22 MED ORDER — TRAZODONE HCL 50 MG PO TABS: 25.0000 mg | ORAL_TABLET | Freq: Every evening | ORAL | Status: DC | PRN

## 2013-06-22 MED ORDER — NITROGLYCERIN 0.4 MG SL SUBL: 0.4000 mg | SUBLINGUAL_TABLET | SUBLINGUAL | Status: DC | PRN

## 2013-06-22 MED ORDER — PROCHLORPERAZINE 25 MG RE SUPP
12.5000 mg | Freq: Four times a day (QID) | RECTAL | Status: DC | PRN
  Filled 2013-06-22: qty 1

## 2013-06-22 MED ORDER — DOCUSATE SODIUM 100 MG PO CAPS
100.0000 mg | ORAL_CAPSULE | Freq: Two times a day (BID) | ORAL | Status: DC
  Administered 2013-06-22 – 2013-06-30 (×16): 100 mg via ORAL
  Filled 2013-06-22 (×18): qty 1

## 2013-06-22 NOTE — PMR Pre-admission (Signed)
PMR Admission Coordinator Pre-Admission Assessment  Patient: [name] is an 72 y.o., male MRN: [id number] DOB: Jun 15, 1941 Height: 6' (182.9 cm) Weight: 86.183 kg (190 lb)              Insurance Information HMO:     PPO:      PCP:      IPA:      80/20: yes     OTHER: no HMO PRIMARY: Medicare a and b      Policy#: [id number]      Subscriber: pt Benefits:  Phone #: [email or web address]     Name: 06/22/13 Eff. Date: 12/27/07     Deduct: $1216      Out of Pocket Max: none      Life Max: none CIR: 100%      SNF: 20 full days Outpatient: 80%     Co-Pay: 20% Home Health: 100%      Co-Pay: none DME: 80%     Co-Pay: 20% Providers: pt choice  SECONDARY: [employer]      Policy#: [id number]      Subscriber: pt  Medicaid Application Date:       Case Manager:  Disability Application Date:       Case Worker:   Emergency Contact Information Contact Information   Name Relation Home Work Mobile   Borman,Peggy Spouse [phone]  [phone]   Ethen, Bannan [phone]  [phone]     Current Medical History  Patient Admitting Diagnosis: 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 male with history of CAD, HTN, SBO, left foot drop, RLE pain with radiation to the knee and falls due to severe weakness of ileo-psoas as well as quadriceps muscle. 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 Jeral Fruit. Post op on with headaches and on bedrest for minimal CSF leak till 06/20/13. RLE weakness decreasing with decrease in back pain. Therapies initiated yesterday and patient with poor posture with inability to activate BLE due to sensory deficits as well as incontinent of bladder and bowel (lack of sensation reported). MD, PT, OT recommending CIR.  Has paresthesias in the right lower extremity which he states were not present  preoperatively.  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 Hyperlipidemia   . Small bowel problem     HAD ALOT OF SCAR TISSUE FROM PREVIOUS SURGERIES..NG WAS INSERTED ...Marland KitchenMarland KitchenNO SURGERY NEEDED...Marland KitchenMarland KitchenIN FOR 8 DAYS  . Disorder of blood     BEEN TREATED BY DERMATOLOGIST X 4 YRS..."BLOOD BLISTERS"  . Arthritis   . Gout   . Anxiety   . Hx MRSA infection     rt shoulder  . Pneumonia     "I've had it 3-4 times"  . Coronary artery disease     IN 2000   STENT PLACED IN 2012 sees Dr. Dietrich Pates, saw last 2013  . HTN (hypertension)     sees [name] in [place]  . Small bowel obstruction   . Ventral hernia   . Acute renal failure 04/17/2013  .  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 There is no history of Anesthesia problems, Hypotension, Malignant hyperthermia, or Pseudochol deficiency.  Prior Rehab/Hospitalizations: none  Current Medications  Current facility-administered medications:0.9 %  sodium chloride infusion, 250 mL, Intravenous, Continuous, Karn Cassis, MD;  0.9 %  sodium chloride infusion, , Intravenous, Continuous, Karn Cassis, MD, Last Rate: 100 mL/hr at 06/19/13 1520;  acetaminophen (TYLENOL) suppository 650 mg, 650 mg, Rectal, Q4H PRN, Karn Cassis, MD;  acetaminophen (TYLENOL) tablet 650 mg, 650 mg, Oral, Q4H PRN, Karn Cassis, MD atorvastatin (LIPITOR) tablet 40 mg, 40 mg, Oral, Daily, Karn Cassis, MD, 40 mg at 06/22/13 1029;  citalopram (CELEXA) tablet 40 mg, 40 mg, Oral, Daily, Karn Cassis, MD, 40 mg at  06/22/13 1029;  diazepam (VALIUM) tablet 5 mg, 5 mg, Oral, Q6H PRN, Karn Cassis, MD, 5 mg at 06/22/13 0759;  docusate sodium (COLACE) capsule 100 mg, 100 mg, Oral, BID, Karn Cassis, MD, 100 mg at 06/22/13 1030 doxazosin (CARDURA) tablet 8 mg, 8 mg, Oral, QHS, Karn Cassis, MD, 8 mg at 06/21/13 2133;  gabapentin (NEURONTIN) capsule 300 mg, 300 mg, Oral, TID, Karn Cassis, MD, 300 mg at 06/22/13 1029;  heparin injection 5,000 Units, 5,000 Units, Subcutaneous, Q8H, Karn Cassis, MD, 5,000 Units at 06/22/13 0600;  lactated ringers infusion, , Intravenous, Continuous, Laverle Hobby, MD, Last Rate: 10 mL/hr at 06/17/13 1216 menthol-cetylpyridinium (CEPACOL) lozenge 3 mg, 1 lozenge, Oral, PRN, Karn Cassis, MD;  mupirocin ointment (BACTROBAN) 2 % 1 application, 1 application, Nasal, BID, Laverle Hobby, MD, 1 application at 06/22/13 1031;  nitroGLYCERIN (NITROSTAT) SL tablet 0.4 mg, 0.4 mg, Sublingual, Q5 min PRN, Karn Cassis, MD;  ondansetron [facility]) injection 4 mg, 4 mg, Intravenous, Q4H PRN, Karn Cassis, MD, 4 mg at 06/19/13 2017 oxyCODONE-acetaminophen (PERCOCET/ROXICET) 5-325 MG per tablet 1-2 tablet, 1-2 tablet, Oral, Q4H PRN, Karn Cassis, MD, 2 tablet at 06/22/13 0759;  phenol (CHLORASEPTIC) mouth spray 1 spray, 1 spray, Mouth/Throat, PRN, Karn Cassis, MD;  predniSONE (DELTASONE) tablet 20 mg, 20 mg, Oral, Daily, Karn Cassis, MD, 20 mg at 06/22/13 1029 promethazine (PHENERGAN) injection 25 mg, 25 mg, Intravenous, Q6H PRN, Temple Pacini, MD, 12.5 mg at 06/20/13 0010;  sodium chloride 0.9 % injection 3 mL, 3 mL, Intravenous, Q12H, Karn Cassis, MD, 3 mL at 06/21/13 2134;  sodium chloride 0.9 % injection 3 mL, 3 mL, Intravenous, PRN, Karn Cassis, MD;  tamsulosin [facility]) capsule 0.8 mg, 0.8 mg, Oral, Daily, Karn Cassis, MD, 0.8 mg at 06/22/13 1029 zolpidem (AMBIEN) tablet 5 mg, 5 mg, Oral, QHS PRN, Karn Cassis, MD  Patients Current Diet:  General  Precautions / Restrictions Precautions Precautions: Back;Fall Precaution Booklet Issued: Yes (comment) Precaution Comments: Reviewed back precautions with pt. Restrictions Weight Bearing Restrictions: No   Prior Activity Level Pt independent without AD pta. Has AFO to his lle after previous surgery. 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 His sternum was removed, ribs, and has r shoulder issues. 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, research (life sciences) / Equipment Home Assistive Devices/Equipment: Gilmer Mor (specify  quad or straight);Splint (specify type) Home Equipment: Walker - 2 wheels;Cane - single point;Bedside commode;Grab bars - tub/shower  Prior Functional Level Prior Function Level of Independence: Independent  Current Functional Level Cognition  Overall Cognitive Status: Within Functional Limits for tasks assessed Orientation Level: Oriented X4    Extremity Assessment (includes Sensation/Coordination)          ADLs  Eating/Feeding: Independent Where Assessed - Eating/Feeding: Chair Grooming: Set up;Supervision/safety Where Assessed - Grooming: Supported sitting Upper Body Bathing: Set up;Supervision/safety Where Assessed - Upper Body Bathing: Supported sitting Lower Body Bathing: +2 Total assistance Lower Body Bathing: Patient Percentage: 10% Where Assessed - Lower Body Bathing: Supported sit to stand Upper Body Dressing: Set up;Supervision/safety Where Assessed - Upper Body Dressing: Supported sitting Lower Body Dressing: Other (comment);Maximal assistance (sock, shoe, underwear) Lower Body Dressing: Patient Percentage: 0% Where Assessed - Lower Body Dressing: Supported sitting Toilet Transfer: +2 Total assistance Toilet Transfer: Patient Percentage: 20% (20% for stand to sit and stand pivot; 30% for sit to stand) Toilet  Transfer Method: Sit to stand;Stand pivot Toilet Transfer Equipment: Bedside commode Toileting - Clothing Manipulation and Hygiene: +2 Total assistance Toileting - Clothing Manipulation and Hygiene: Patient Percentage: 0% Where Assessed - Glass blower/designer Manipulation and Hygiene: Other (comment);Sit to stand from 3-in-1 or toilet Tub/Shower Transfer Method: Not assessed Equipment Used: Gait belt;Rolling walker;Reacher;Long-handled sponge;Long-handled shoe horn;Sock aid Transfers/Ambulation Related to ADLs: +2 Total A 20% for stand to sit and stand pivot and 30% for sit to stand transfer. ADL Comments: Practiced with AE for LB dressing. Pt requiring assistance from OT for this. Pt was incontinent of bowel in chair-transferred from chair to [facility] and pt had more bowel movement. +2 total A for hygiene. Pt did not pull up underwear over bottom (was soiled), but OT educated on different techniques he could do, such as leaning side to side or standing (not alone).    Mobility  Bed Mobility: Sit to Supine Rolling Left: 4: Min assist;With rail Left Sidelying to Sit: 3: Mod assist Left Sidelying to Sit: Patient Percentage: 20% Sitting - Scoot to Edge of Bed: 3: Mod assist Sit to Supine: 1: +2 Total assist Sit to Supine: Patient Percentage: 50%    Transfers  Transfers: Sit to Stand;Stand to Sit Sit to Stand: 1: +2 Total assist;With upper extremity assist;From chair/3-in-1 Sit to Stand: Patient Percentage: 30% Stand to Sit: 1: +2 Total assist;To bed;To chair/3-in-1 Stand to Sit: Patient Percentage: 20% Squat Pivot Transfers: 1: +2 Total assist Squat Pivot Transfers: Patient Percentage: 20%    Ambulation / Gait / Stairs / Wheelchair Mobility  Ambulation/Gait Ambulation/Gait Assistance: 1: +2 Total assist Ambulation/Gait: Patient Percentage: 30% Ambulation Distance (Feet): 3 Feet Assistive device: Rolling walker Ambulation/Gait Assistance Details: 2 person A for balance and WBing.  pt with  scissoring gait and ataxic like movements.  pt wearing L AFO today and indicates he has worn this since a previous back surgery.   Gait Pattern: Step-through pattern;Decreased stride length;Decreased dorsiflexion - right;Decreased dorsiflexion - left;Scissoring;Ataxic;Narrow base of support Stairs: No Wheelchair Mobility Wheelchair Mobility: No    Posture / Balance      Special needs/care consideration Bowel mgmt: incontinent; unable to feel need Bladder mgmt: foley    Previous Home Environment Living Arrangements: Spouse/significant other  Lives With: Spouse Available Help at Discharge: Family;Available 24 hours/day Type of Home: House Home Layout: One level Home Access: Stairs to enter Entrance Stairs-Rails: None Entrance Stairs-Number of Steps: 1 Bathroom Shower/Tub: Psychologist, counselling;Door [employer] Toilet: Administrator  Accessibility: Yes How Accessible: Accessible via walker Home Care Services: No  Discharge Living Setting Plans for Discharge Living Setting: Patient's home;Lives with (comment);Other (Comment) (spouse) Type of Home at Discharge: House Discharge Home Layout: One level Discharge Home Access: Stairs to enter Entrance Stairs-Rails: None Entrance Stairs-Number of Steps: 1 Discharge Bathroom Shower/Tub: Walk-in shower;Door Discharge Bathroom Toilet: Standard Discharge Bathroom Accessibility: Yes How Accessible: Accessible via walker Does the patient have any problems obtaining your medications?: No  Social/Family/Support Systems Patient Roles: Spouse;Parent Contact Information: Tawni Millers, wife Anticipated Caregiver: wife and family Anticipated Caregiver's Contact Information: see above Ability/Limitations of Caregiver: no limitations Caregiver Availability: 24/7 Discharge Plan Discussed with Primary Caregiver: Yes Is Caregiver In Agreement with Plan?: No Does Caregiver/Family have Issues with Lodging/Transportation while Pt is in Rehab?:  No    Goals/Additional Needs Patient/Family Goal for Rehab: supervision to min assist with PT and OT Expected length of stay: ELOS 10 to 12 days Pt/Family Agrees to Admission and willing to participate: Yes Program Orientation Provided & Reviewed with Pt/Caregiver Including Roles  & Responsibilities: Yes   Decrease burden of Care through IP rehab admission: n/a  Possible need for SNF placement upon discharge:n/a   Patient Condition: This patient's condition remains as documented in the consult dated 06/21/13, in which the Rehabilitation Physician determined and documented that the patient's condition is appropriate for intensive rehabilitative care in an inpatient rehabilitation facility. Will admit to inpatient rehab today.  Preadmission Screen Completed By:  Clois Dupes, 06/22/2013 11:06 AM ______________________________________________________________________   Discussed status with Dr. Wynn Banker on 06/22/13 at  1105 and received telephone approval for admission today.  Admission Coordinator:  Clois Dupes, time 7829 Date 06/22/13.

## 2013-06-22 NOTE — Progress Notes (Signed)
Patient with no leg pain. Ambulating with help. Foley in place. No headache. To be transfer to the rehabilitation service. i will continue to follow him.

## 2013-06-22 NOTE — Discharge Summary (Signed)
Physician Discharge Summary  Patient ID: [name] MRN: [id number] DOB/AGE: November 04, 1940 72 y.o.  Admit date: 06/17/2013 Discharge date: 06/22/2013  Admission Diagnoses:right l3-4 hnp. S/p fusion l4 to S1  Discharge Diagnoses: same Active Problems:   * No active hospital problems. *   Discharged Condition: incoordination both legs. Perineal numbness. Urinary incontinence  Hospital Course: surgery  Consults:rehabilitation medicine  Significant Diagnostic Studies: mri  Treatments: righr l3-4 discectomy. Lysis of adhesions  Discharge Exam:foley in place. Decrease sensation both lower extremities. Wound dry   Disposition: to be admitted to the rehabilitation service   Future Appointments Provider Department Dept Phone   11/03/2013 8:30 AM Vickki Hearing, MD [facility] [phone]       Medication List         atorvastatin 40 MG tablet  Commonly known as:  LIPITOR  Take 1 tablet (40 mg total) by mouth daily.     citalopram 40 MG tablet  Commonly known as:  CELEXA  Take 40 mg by mouth daily.     doxazosin 4 MG tablet  Commonly known as:  CARDURA  Take 8 mg by mouth at bedtime.     nitroGLYCERIN 0.4 MG SL tablet  Commonly known as:  NITROSTAT  Place 0.4 mg under the tongue every 5 (five) minutes as needed. For chest pain     oxyCODONE 15 MG immediate release tablet  Commonly known as:  ROXICODONE  Take 7.5 mg by mouth every 4 (four) hours as needed. For pain     predniSONE 10 MG tablet  Commonly known as:  DELTASONE  Take 20 mg by mouth daily.     VITAMIN B 12 PO  Take 1 tablet by mouth daily.     vitamin C 500 MG tablet  Commonly known as:  ASCORBIC ACID  Take 500 mg by mouth daily.     VITAMIN D (CHOLECALCIFEROL) PO  Take 2-3 tablets by mouth daily.     VITAMIN E PO  Take 2-3 tablets by mouth daily.         Signed: Karn Cassis 06/22/2013, 11:12 AM

## 2013-06-22 NOTE — H&P (Signed)
Physical Medicine and Rehabilitation Admission H&P  CC: HNP L3/4 with RLE weakness, BLE sensory deficits as well as neurogenic B/B symptoms  HPI: [name] is a 72 y.o. male with history of CAD, HTN, SBO, Lumbar decompression 2/14 with left foot drop, RLE pain with radiation to the knee and falls due to severe weakness of ileo-psoas as well as quadriceps muscle. Patient also with history of RTC limiting RUE as well as partial resection of sternum due mediastinitis. 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 Jeral Fruit. Post op on with headaches and on bedrest for minimal CSF leak till 06/20/13. RLE weakness decreasing with decrease in back pain. Therapies initiated and patient with poor posture with inability to activate BLE due to sensory deficits as well as incontinent of bladder and bowel (lack of sensation reported). Foley removed today. Therapy initiated and rehab team recommended CIR.  Review of Systems  HENT: Negative for hearing loss.  Eyes: Negative for blurred vision.  Respiratory: Negative for cough and sputum production.  Cardiovascular: Negative for chest pain and palpitations.  Gastrointestinal: Positive for constipation (chronic probs).  Genitourinary:  H/o hesitancy.  Musculoskeletal: Positive for back pain and myalgias (chronic steriods. ).  Neurological: Positive for sensory change (Decreased sensation bilateral hands chronically. Numbness B-feet. Decreased sensation perineal and rectal area.). Negative for dizziness and headaches.  Endo/Heme/Allergies: Bruises/bleeds easily.  Psychiatric/Behavioral: The patient is nervous/anxious.   Past Medical History   Diagnosis  Date   .  Hyperlipidemia    .  Small bowel problem      HAD ALOT OF SCAR TISSUE FROM PREVIOUS SURGERIES..NG WAS INSERTED ...Marland KitchenMarland KitchenNO SURGERY NEEDED...Marland KitchenMarland KitchenIN FOR 8 DAYS   .  Disorder of blood      BEEN TREATED BY  DERMATOLOGIST X 4 YRS..."BLOOD BLISTERS"   .  Arthritis    .  Gout    .  Anxiety    .  Hx MRSA infection      rt shoulder   .  Pneumonia      "I've had it 3-4 times"   .  Coronary artery disease      IN 2000 STENT PLACED IN 2012 sees Dr. Dietrich Pates, saw last 2013   .  HTN (hypertension)      sees [name] in [place]   .  Small bowel obstruction    .  Ventral hernia    .  Acute renal failure  04/17/2013    Past Surgical History   Procedure  Laterality  Date   .  Sternal surg   2000     HAS HAD 5-6 ON HIS STERNUM; "caught MRSA in it"   .  Lumbar laminectomy/decompression microdiscectomy   06/13/2011     Procedure: LUMBAR LAMINECTOMY/DECOMPRESSION MICRODISCECTOMY; Surgeon: Karn Cassis; Location: [facility]; Service: Neurosurgery; Laterality: N/A; Lumbar three, lumbar four-five Laminectomy   .  Eye surgery       bilateral cataract   .  Anterior cervical corpectomy   12/17/11   .  Incision and drainage of wound   ~ 20ll; 12/03/11     "had infection in my right"   .  Shoulder open rotator cuff repair   ~ 2011     right   .  Peripherally inserted central catheter insertion   2011 & 11/2011   .  Cataract extraction w/ intraocular lens implant, bilateral   ? 2011   .  Coronary artery bypass graft   2000     CABG X5   .  Back surgery       lumbar   .  Tonsillectomy   1949   .  Cholecystectomy   2006 "or after"   .  Coronary angioplasty with stent placement   2012   .  Anterior cervical corpectomy   12/17/2011     Procedure: ANTERIOR CERVICAL CORPECTOMY; Surgeon: Karn Cassis, MD; Location: [facility]; Service: Neurosurgery; Laterality: N/A; Anterior Cervical Decompression Fusion Five to Thoracic Two with plating   .  Lumbar laminectomy/decompression microdiscectomy  Right  06/17/2013     Procedure: LUMBAR LAMINECTOMY/DECOMPRESSION MICRODISCECTOMY 1 LEVEL; Surgeon: Karn Cassis, MD; Location: [facility]; Service: Neurosurgery; Laterality: Right; Right L3-4  Microdiskectomy    Family History   Problem  Relation  Age of Onset   .  Heart disease     .  Hypertension  Mother    .  Hypertension  Maternal Aunt    .  Hypertension  Maternal Uncle    .  Anesthesia problems  Neg Hx    .  Hypotension  Neg Hx    .  Malignant hyperthermia  Neg Hx    .  Pseudochol deficiency  Neg Hx     Social History: Married. 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 He reports that he quit smoking about 14 years ago. His smoking use included Cigarettes. He has a 1 pack-year smoking history. He has never used smokeless tobacco. He reports that he does not drink alcohol or use illicit drugs.  Allergies   Allergen  Reactions   .  Morphine  Other (See Comments)     REACTION: made him go crazy; "I had fun w/it; my family didn't think it was too funny"   .  Metoprolol  Other (See Comments)     REACTION: Tachycardia; "don't remember how bad it was; it was so long ago"    Medications Prior to Admission   Medication  Sig  Dispense  Refill   .  atorvastatin (LIPITOR) 40 MG tablet  Take 1 tablet (40 mg total) by mouth daily.  90 tablet  2   .  citalopram (CELEXA) 40 MG tablet  Take 40 mg by mouth daily.     .  Cyanocobalamin (VITAMIN B 12 PO)  Take 1 tablet by mouth daily.     Marland Kitchen  doxazosin (CARDURA) 4 MG tablet  Take 8 mg by mouth at bedtime.     Marland Kitchen  oxyCODONE (ROXICODONE) 15 MG immediate release tablet  Take 7.5 mg by mouth every 4 (four) hours as needed. For pain     .  predniSONE (DELTASONE) 10 MG tablet  Take 20 mg by mouth daily.     .  vitamin C (ASCORBIC ACID) 500 MG tablet  Take 500 mg by mouth daily.     Marland Kitchen  VITAMIN D, CHOLECALCIFEROL, PO  Take 2-3 tablets by mouth daily.     Marland Kitchen  VITAMIN E PO  Take 2-3 tablets by mouth daily.     .  nitroGLYCERIN (NITROSTAT) 0.4 MG SL tablet  Place 0.4 mg under the tongue every 5 (five) minutes as needed. For chest pain      Home:  Home Living  Family/patient expects to be discharged to:: Private residence  Living Arrangements:  Spouse/significant other  Available Help at Discharge: Family;Available 24 hours/day  Type of Home: House  Home Access: Stairs to enter  Entrance Stairs-Number of Steps: 1  Entrance Stairs-Rails: None  Home Layout: One level  Home Equipment: Walker - 2 wheels;Cane - single point;Bedside commode;Grab bars - tub/shower  Functional History:   Functional Status:  Mobility:  Bed Mobility  Bed Mobility: Sit to Supine  Rolling Left: 4: Min assist;With rail  Left Sidelying to Sit: 3: Mod assist  Left Sidelying to Sit: Patient Percentage: 20%  Sitting - Scoot to Edge of Bed: 3: Mod assist  Sit to Supine: 1: +2 Total assist  Sit to Supine: Patient Percentage: 50%  Transfers  Transfers: Sit to Stand;Stand to Sit  Sit to Stand: 1: +2 Total assist;With upper extremity assist;From chair/3-in-1  Sit to Stand: Patient Percentage: 30%  Stand to Sit: 1: +2 Total assist;To bed;To chair/3-in-1  Stand to Sit: Patient Percentage: 20%  Squat Pivot Transfers: 1: +2 Total assist  Squat Pivot Transfers: Patient Percentage: 20%  Ambulation/Gait  Ambulation/Gait Assistance: 1: +2 Total assist  Ambulation/Gait: Patient Percentage: 30%  Ambulation Distance (Feet): 3 Feet  Assistive device: Rolling walker  Ambulation/Gait Assistance Details: 2 person A for balance and WBing. pt with scissoring gait and ataxic like movements. pt wearing L AFO today and indicates he has worn this since a previous back surgery.  Gait Pattern: Step-through pattern;Decreased stride length;Decreased dorsiflexion - right;Decreased dorsiflexion - left;Scissoring;Ataxic;Narrow base of support  Stairs: No  Wheelchair Mobility  Wheelchair Mobility: No  ADL:  ADL  Eating/Feeding: Independent  Where Assessed - Eating/Feeding: Chair  Grooming: Set up;Supervision/safety  Where Assessed - Grooming: Supported sitting  Upper Body Bathing: Set up;Supervision/safety  Where Assessed - Upper Body Bathing: Supported sitting  Lower Body  Bathing: +2 Total assistance  Where Assessed - Lower Body Bathing: Supported sit to stand  Upper Body Dressing: Set up;Supervision/safety  Where Assessed - Upper Body Dressing: Supported sitting  Lower Body Dressing: Other (comment);Maximal assistance (sock, shoe, underwear)  Where Assessed - Lower Body Dressing: Supported sitting  Toilet Transfer: +2 Total assistance  Toilet Transfer Method: Sit to stand;Stand pivot  Toilet Transfer Equipment: Bedside commode  Tub/Shower Transfer Method: Not assessed  Equipment Used: Gait belt;Rolling walker;Reacher;Long-handled sponge;Long-handled shoe horn;Sock aid  Transfers/Ambulation Related to ADLs: +2 Total A 20% for stand to sit and stand pivot and 30% for sit to stand transfer.  ADL Comments: Practiced with AE for LB dressing. Pt requiring assistance from OT for this. Pt was incontinent of bowel in chair-transferred from chair to [facility] and pt had more bowel movement. +2 total A for hygiene. Pt did not pull up underwear over bottom (was soiled), but OT educated on different techniques he could do, such as leaning side to side or standing (not alone).  Cognition:  Cognition  Overall Cognitive Status: Within Functional Limits for tasks assessed  Orientation Level: Oriented X4  Cognition  Arousal/Alertness: Awake/alert  Behavior During Therapy: WFL for tasks assessed/performed  Overall Cognitive Status: Within Functional Limits for tasks assessed  Physical Exam:  Blood pressure 163/76, pulse 64, temperature 98.2 F (36.8 C), temperature source Oral, resp. rate 18, height 6' (1.829 m), weight 86.183 kg (190 lb), SpO2 96.00%.  Physical Exam  Nursing note and vitals reviewed.  Constitutional: He is oriented to person, place, and time. He appears well-developed and well-nourished.  HENT:  Head: Normocephalic and atraumatic.  Mouth/Throat: Oropharynx is clear and moist.  Wears upper dentures and partial on lower  Eyes: Conjunctivae are normal. Pupils  are equal, round, and reactive to light.  Neck: Normal range of motion. Neck supple.  Cardiovascular: Normal rate and regular rhythm.  Respiratory: Effort normal and breath sounds normal. No respiratory distress. He has no wheezes.  GI: Soft. Bowel sounds are normal. He exhibits no distension. There is no tenderness.  Large midline incision with large incision hernia (wears corset for support). Multiple well healed old scars.  Musculoskeletal: He exhibits no edema and no tenderness.  Neurological: He is alert and oriented to person, place, and time.  Speech clear. Follows commands without difficulty. Decreased sensation saddle area--lax anal sphincter with decreased tone--stool felt in rectal vault.  Left foot drop.  Decreased S1 and L5 dermatomes sensationLeft Motor strength is 3 minus right deltoid for at left deltoid 4/5 bilateral bicep triceps and grip 4- hip flexors,4- bilateral knee extensors and  right ankle dorsiflexor and plantar flexor 0/5 left ankle dorsiflexor and plantar flexor  Skin: Skin is warm and dry.  Back incision with minimal serous drainage on dressing. Sutures in place. Two small abrasions noted on left upper buttock area.  Arlie Solomons Admission Physician Evaluation:  1. Functional deficits secondary to cauda equina syndrome as well as chronic left L5,S1 radiculopathy. 2. Patient is admitted to receive collaborative, interdisciplinary care between the physiatrist, rehab nursing staff, and therapy team. 3. Patient's level of medical complexity and substantial therapy needs in context of that medical necessity cannot be provided at a lesser intensity of care such as a SNF. 4. Patient has experienced substantial functional loss from his/her baseline which was documented above under the "Functional History" and "Functional Status" headings. Judging by the patient's diagnosis, physical exam, and functional history, the patient has potential for functional progress which will result  in measurable gains while on inpatient rehab. These gains will be of substantial and practical use upon discharge in facilitating mobility and self-care at the household level. 5. Physiatrist will provide 24 hour management of medical needs as well as oversight of the therapy plan/treatment and provide guidance as appropriate regarding the interaction of the two. 6. 24 hour rehab nursing will assist with bladder management, bowel management, safety, skin/wound care, disease management, medication administration, pain management and patient education and help integrate therapy concepts, techniques,education, etc. 7. PT will assess and treat for/with: pre gait, gait training, endurance , safety, equipment, neuromuscular re education. Goals are: min A Mobility. 8. OT will assess and treat for/with: ADLs,, Neuromuscular re education, safety, endurance, equipment. Goals are: Sup UE ADL, Min A LE ADL. 9. SLP will assess and treat for/with: NA. Goals are: NA. 10. Case Management and Social Worker will assess and treat for psychological issues and discharge planning. 11. Team conference will be held weekly to assess progress toward goals and to determine barriers to discharge. 12. Patient will receive at least 3 hours of therapy per day at least 5 days per week. 13. ELOS: 9-12 days  14. 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  1. DVT Prophylaxis/Anticoagulation: Pharmaceutical: Lovenox  2. Chronic pain/Pain Management: Has been on steroids X 1 year for pain management--willing to attempt taper during hospitalization. Continue neurontin for neuropathy. Prn oxcodone for pain effective.  3. Anxiety disorder/ Mood: Provide ego support as has lot of anxiety about current condition. Continue celexa. LCSW to follow for evaluation.  4. Neuropsych: This patient is capable of making decisions on his own behalf.  5. HTN: monitor with bid checks.  Continue Cardura.  6. Neurogenic B/B: Augment bowel program with suppository daily. Also has h/o BPH--Check PVRs. Change flomax  to HS. Check UA/UCS.  7. 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   Erick Colace M.D. [facility] FAAPM&R (Sports Med, Neuromuscular Med) Diplomate Am Board of Electrodiagnostic Med Diplomate Am Board of Pain Medicine Fellow Am Board of Interventional Pain Physicians  06/22/2013

## 2013-06-22 NOTE — Progress Notes (Signed)
I met with pt at bedside. Discussed admission to inpt rehab today. I will make arrangements. [phone]

## 2013-06-22 NOTE — Progress Notes (Signed)
Patient arrived from 4N at 29 with RN at bedside. RN explained rehab procedure, fall safety plan and call bell system with verbal understanding. Patient resting comfortably with bed alarm on and call bell at side.

## 2013-06-22 NOTE — Progress Notes (Signed)
Report given to [employer], on 4W. Assessments remained unchanged as at now. Due to void after foley removal this morning.

## 2013-06-22 NOTE — Progress Notes (Signed)
CSW reviewed chart and Pt will d/c to inpt rehab ([facility]) today and there are no further needs at this time for SNF placement.   CSW signing off.    Leron Croak [facility]  4N 1-16;  [phone] Phone: [phone]

## 2013-06-23 DIAGNOSIS — G834 Cauda equina syndrome: Secondary | ICD-10-CM

## 2013-06-23 LAB — GLUCOSE, CAPILLARY: Glucose-Capillary: 200 mg/dL — ABNORMAL HIGH (ref 70–99)

## 2013-06-23 MED ORDER — BETHANECHOL CHLORIDE 25 MG PO TABS
25.0000 mg | ORAL_TABLET | Freq: Three times a day (TID) | ORAL | Status: DC
  Administered 2013-06-23 – 2013-06-24 (×4): 25 mg via ORAL
  Filled 2013-06-23 (×8): qty 1

## 2013-06-23 MED ORDER — PREDNISONE 5 MG PO TABS
15.0000 mg | ORAL_TABLET | Freq: Every day | ORAL | Status: DC
  Administered 2013-06-24 – 2013-07-05 (×12): 15 mg via ORAL
  Filled 2013-06-23 (×13): qty 1

## 2013-06-23 NOTE — Progress Notes (Signed)
Subjective/Complaints: Still requiring in and out catheterization. Slept poorly without significant pain Review of Systems - Negative except Weakness in legs, numbness left foot, problems with voiding Objective: Vital Signs: Blood pressure 123/69, pulse 68, temperature 98.2 F (36.8 C), temperature source Oral, resp. rate 17, height 6' (1.829 m), weight 83.8 kg (184 lb 11.9 oz), SpO2 94.00%. No results found. No results found for this or any previous visit (from the past 72 hour(s)).    Nursing note and vitals reviewed.  Constitutional: He is oriented to person, place, and time. He appears well-developed and well-nourished.  HENT:  Head: Normocephalic and atraumatic.  Mouth/Throat: Oropharynx is clear and moist.  Wears upper dentures and partial on lower  Eyes: Conjunctivae are normal. Pupils are equal, round, and reactive to light.  Neck: Normal range of motion. Neck supple.  Cardiovascular: Normal rate and regular rhythm.  Respiratory: Effort normal and breath sounds normal. No respiratory distress. He has no wheezes.  GI: Soft. Bowel sounds are normal. He exhibits no distension. There is no tenderness.  Large midline incision with large incision hernia (wears corset for support). Multiple well healed old scars.  Musculoskeletal: He exhibits no edema and no tenderness.  Neurological: He is alert and oriented to person, place, and time.  Speech clear. Follows commands without difficulty. Decreased sensation saddle area--lax anal sphincter with decreased tone--stool felt in rectal vault.  Left foot drop.  Decreased S1 and L5 dermatomes sensationLeft Motor strength is 3 minus right deltoid for at left deltoid 4/5 bilateral bicep triceps and grip 4- hip flexors,4- bilateral knee extensors and right ankle dorsiflexor and plantar flexor 0/5 left ankle dorsiflexor and plantar flexor  Skin: Skin is warm and dry.    Assessment/Plan: 1. Functional deficits secondary to cauda equina  syndrome which require 3+ hours per day of interdisciplinary therapy in a comprehensive inpatient rehab setting. Physiatrist is providing close team supervision and 24 hour management of active medical problems listed below. Physiatrist and rehab team continue to assess barriers to discharge/monitor patient progress toward functional and medical goals. FIM:                   Comprehension Comprehension Mode: Auditory Comprehension: 5-Follows basic conversation/direction: With no assist  Expression Expression Mode: Verbal Expression: 5-Expresses basic needs/ideas: With extra time/assistive device  Social Interaction Social Interaction: 7-Interacts appropriately with others - No medications needed.  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  1. DVT Prophylaxis/Anticoagulation: Pharmaceutical: Lovenox  2. Chronic pain/Pain Management: Has been on steroids X 1 year for pain management--willing to attempt taper during hospitalization. Continue neurontin for neuropathy. Prn oxcodone for pain effective.  3. Anxiety disorder/ Mood: Provide ego support as has lot of anxiety about current condition. Continue celexa. LCSW to follow for evaluation.  4. Neuropsych: This patient is capable of making decisions on his own behalf.  5. HTN: monitor with bid checks. Continue Cardura.  6. Neurogenic B/B: Augment bowel program with suppository daily. Also has h/o BPH--Check PVRs. Change flomax to HS. Check UA/UCS.  7. 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      LOS (Days) 1 A FACE TO FACE EVALUATION WAS PERFORMED  KIRSTEINS,ANDREW E 06/23/2013, 9:34 AM

## 2013-06-23 NOTE — Progress Notes (Signed)
Patient ID: [name], male   DOB: 19-Oct-1940, 72 y.o.   MRN: [id number] Alert, foley out. On diaper. No leg pain. Able to lift both legs while in the bed.

## 2013-06-24 ENCOUNTER — Inpatient Hospital Stay (HOSPITAL_COMMUNITY): Payer: Medicare Other | Admitting: Physical Therapy

## 2013-06-24 ENCOUNTER — Inpatient Hospital Stay (HOSPITAL_COMMUNITY): Payer: Medicare Other

## 2013-06-24 ENCOUNTER — Inpatient Hospital Stay (HOSPITAL_COMMUNITY): Payer: Medicare Other | Admitting: Occupational Therapy

## 2013-06-24 LAB — COMPREHENSIVE METABOLIC PANEL
AST: 24 U/L (ref 0–37)
Alkaline Phosphatase: 70 U/L (ref 39–117)
BUN: 20 mg/dL (ref 6–23)
CO2: 30 mEq/L (ref 19–32)
Calcium: 9.2 mg/dL (ref 8.4–10.5)
Chloride: 104 mEq/L (ref 96–112)
Creatinine, Ser: 1.19 mg/dL (ref 0.50–1.35)
GFR calc Af Amer: 70 mL/min — ABNORMAL LOW (ref >90)
GFR calc non Af Amer: 60 mL/min — ABNORMAL LOW (ref >90)
Glucose, Bld: 117 mg/dL — ABNORMAL HIGH (ref 70–99)
Potassium: 4.4 mEq/L (ref 3.5–5.1)
Total Bilirubin: 0.3 mg/dL (ref 0.3–1.2)

## 2013-06-24 LAB — CBC WITH DIFFERENTIAL/PLATELET
Basophils Absolute: 0 10*3/uL (ref 0.0–0.1)
Basophils Relative: 1 % (ref 0–1)
Eosinophils Absolute: 0.3 10*3/uL (ref 0.0–0.7)
Eosinophils Relative: 4 % (ref 0–5)
HCT: 33 % — ABNORMAL LOW (ref 39.0–52.0)
Hemoglobin: 11.2 g/dL — ABNORMAL LOW (ref 13.0–17.0)
Lymphocytes Relative: 18 % (ref 12–46)
Lymphs Abs: 1.5 10*3/uL (ref 0.7–4.0)
MCH: 30.7 pg (ref 26.0–34.0)
MCHC: 33.9 g/dL (ref 30.0–36.0)
MCV: 90.4 fL (ref 78.0–100.0)
Monocytes Absolute: 0.9 10*3/uL (ref 0.1–1.0)
Monocytes Relative: 11 % (ref 3–12)
Neutro Abs: 5.5 10*3/uL (ref 1.7–7.7)
Neutrophils Relative %: 66 % (ref 43–77)
Platelets: 271 10*3/uL (ref 150–400)
RBC: 3.65 MIL/uL — ABNORMAL LOW (ref 4.22–5.81)
RDW: 14.4 % (ref 11.5–15.5)
WBC: 8.4 10*3/uL (ref 4.0–10.5)

## 2013-06-24 MED ORDER — BETHANECHOL CHLORIDE 25 MG PO TABS
25.0000 mg | ORAL_TABLET | Freq: Four times a day (QID) | ORAL | Status: DC
  Administered 2013-06-24 – 2013-06-25 (×4): 25 mg via ORAL
  Filled 2013-06-24 (×8): qty 1

## 2013-06-24 NOTE — Progress Notes (Signed)
Subjective/Complaints: Still requiring in and out catheterization. Slept poorly without significant pain.  Inc formed BM yest Appreciate neurosurgery note Review of Systems - Negative except Weakness in legs, numbness left foot, problems with voiding Objective: Vital Signs: Blood pressure 175/95, pulse 73, temperature 98.3 F (36.8 C), temperature source Oral, resp. rate 18, height 6' (1.829 m), weight 83.8 kg (184 lb 11.9 oz), SpO2 95.00%. No results found. Results for orders placed during the hospital encounter of 06/22/13 (from the past 72 hour(s))  GLUCOSE, CAPILLARY     Status: Abnormal   Collection Time    06/23/13  8:14 PM      Result Value Range   Glucose-Capillary 200 (*) 70 - 99 mg/dL  CBC WITH DIFFERENTIAL     Status: Abnormal   Collection Time    06/24/13  5:27 AM      Result Value Range   WBC 8.4  4.0 - 10.5 K/uL   RBC 3.65 (*) 4.22 - 5.81 MIL/uL   Hemoglobin 11.2 (*) 13.0 - 17.0 g/dL   HCT 19.1 (*) 47.8 - 29.5 %   MCV 90.4  78.0 - 100.0 fL   MCH 30.7  26.0 - 34.0 pg   MCHC 33.9  30.0 - 36.0 g/dL   RDW 62.1  30.8 - 65.7 %   Platelets 271  150 - 400 K/uL   Neutrophils Relative % 66  43 - 77 %   Neutro Abs 5.5  1.7 - 7.7 K/uL   Lymphocytes Relative 18  12 - 46 %   Lymphs Abs 1.5  0.7 - 4.0 K/uL   Monocytes Relative 11  3 - 12 %   Monocytes Absolute 0.9  0.1 - 1.0 K/uL   Eosinophils Relative 4  0 - 5 %   Eosinophils Absolute 0.3  0.0 - 0.7 K/uL   Basophils Relative 1  0 - 1 %   Basophils Absolute 0.0  0.0 - 0.1 K/uL  COMPREHENSIVE METABOLIC PANEL     Status: Abnormal   Collection Time    06/24/13  5:27 AM      Result Value Range   Sodium 141  135 - 145 mEq/L   Potassium 4.4  3.5 - 5.1 mEq/L   Chloride 104  96 - 112 mEq/L   CO2 30  19 - 32 mEq/L   Glucose, Bld 117 (*) 70 - 99 mg/dL   BUN 20  6 - 23 mg/dL   Creatinine, Ser 8.46  0.50 - 1.35 mg/dL   Calcium 9.2  8.4 - 96.2 mg/dL   Total Protein 5.5 (*) 6.0 - 8.3 g/dL   Albumin 2.6 (*) 3.5 - 5.2 g/dL   AST 24   0 - 37 U/L   ALT 21  0 - 53 U/L   Alkaline Phosphatase 70  39 - 117 U/L   Total Bilirubin 0.3  0.3 - 1.2 mg/dL   GFR calc non Af Amer 60 (*) >90 mL/min   GFR calc Af Amer 70 (*) >90 mL/min   Comment: (NOTE)     The eGFR has been calculated using the CKD EPI equation.     This calculation has not been validated in all clinical situations.     eGFR's persistently <90 mL/min signify possible Chronic Kidney     Disease.  GLUCOSE, CAPILLARY     Status: Abnormal   Collection Time    06/24/13  7:29 AM      Result Value Range   Glucose-Capillary 106 (*) 70 - 99  mg/dL      Nursing note and vitals reviewed.  Constitutional: He is oriented to person, place, and time. He appears well-developed and well-nourished.  HENT:  Head: Normocephalic and atraumatic.  Mouth/Throat: Oropharynx is clear and moist.  Wears upper dentures and partial on lower  Eyes: Conjunctivae are normal. Pupils are equal, round, and reactive to light.  Neck: Normal range of motion. Neck supple.  Cardiovascular: Normal rate and regular rhythm.  Respiratory: Effort normal and breath sounds normal. No respiratory distress. He has no wheezes.  Skin: Midline lumbar incision small amount of serous drainage on 4 x 4, no bleeding, no wound dehiscence GI: Soft. Bowel sounds are normal. He exhibits no distension. There is no tenderness.  Large midline incision with large incision hernia (wears corset for support). Multiple well healed old scars.  Musculoskeletal: He exhibits no edema and no tenderness.  Neurological: He is alert and oriented to person, place, and time.  Speech clear. Follows commands without difficulty. Decreased sensation saddle area--lax anal sphincter with decreased tone--stool felt in rectal vault.  Left foot drop.  Decreased S1 and L5 dermatomes sensationLeft Motor strength is 3 minus right deltoid for at left deltoid 4/5 bilateral bicep triceps and grip 4- hip flexors,4- bilateral knee extensors and right  ankle dorsiflexor and plantar flexor 0/5 left ankle dorsiflexor and plantar flexor  Skin: Skin is warm and dry.    Assessment/Plan: 1. Functional deficits secondary to cauda equina syndrome which require 3+ hours per day of interdisciplinary therapy in a comprehensive inpatient rehab setting. Physiatrist is providing close team supervision and 24 hour management of active medical problems listed below. Physiatrist and rehab team continue to assess barriers to discharge/monitor patient progress toward functional and medical goals. FIM: FIM - Bathing Bathing Steps Patient Completed: Chest;Right Arm;Left Arm;Abdomen;Right upper leg;Left upper leg;Right lower leg (including foot);Left lower leg (including foot) Bathing: 1: Two helpers (total X2 for sit<>stand)  FIM - Upper Body Dressing/Undressing Upper body dressing/undressing steps patient completed: Thread/unthread right sleeve of pullover shirt/dresss;Thread/unthread left sleeve of pullover shirt/dress;Put head through opening of pull over shirt/dress;Pull shirt over trunk Upper body dressing/undressing: 5: Set-up assist to: Obtain clothing/put away FIM - Lower Body Dressing/Undressing Lower body dressing/undressing steps patient completed: Thread/unthread right pants leg;Thread/unthread left pants leg;Don/Doff right sock;Don/Doff left sock Lower body dressing/undressing: 1: Two helpers (Total X2 for sit<>stand)  FIM - Toileting Toileting: 0: Activity did not occur  FIM - Archivist Transfers: 0-Activity did not occur  FIM - Banker Devices: Bed rails;HOB elevated Bed/Chair Transfer: 5: Supine > Sit: Supervision (verbal cues/safety issues);3: Bed > Chair or W/C: Mod A (lift or lower assist)     Comprehension Comprehension Mode: Auditory Comprehension: 6-Follows complex conversation/direction: With extra time/assistive device  Expression Expression Mode: Verbal Expression:  6-Expresses complex ideas: With extra time/assistive device  Social Interaction Social Interaction: 6-Interacts appropriately with others with medication or extra time (anti-anxiety, antidepressant).  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  1. DVT Prophylaxis/Anticoagulation: Pharmaceutical: Lovenox  2. Chronic pain/Pain Management: Has been on steroids X 1 year for pain management--willing to attempt taper during hospitalization. Continue neurontin for neuropathy. Prn oxcodone for pain effective.  3. Anxiety disorder/ Mood: Provide ego support as has lot of anxiety about current condition. Continue celexa. LCSW to follow for evaluation.  4. Neuropsych: This  patient is capable of making decisions on his own behalf.  5. HTN: monitor with bid checks. Continue Cardura.  6. Neurogenic B/B: Augment bowel program with suppository daily. Also has h/o BPH--Check PVRs. Change flomax to HS. Check UA/UCS.  7. 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      LOS (Days) 2 A FACE TO FACE EVALUATION WAS PERFORMED  KIRSTEINS,ANDREW E 06/24/2013, 10:18 AM

## 2013-06-24 NOTE — Progress Notes (Signed)
At 0150, no void, I & O cath-450cc's. At 0620 daily supp.given. Requesting 2 oxy ir at [phone] for complaint of bilateral hip pain, "sharp", "4".[name] A

## 2013-06-24 NOTE — Evaluation (Signed)
Occupational Therapy Assessment and Plan & Session Notes  Patient Details  Name: [name] MRN: [id number] Date of Birth: 06-Jun-1941  OT Diagnosis: abnormal posture, acute pain, ataxia and muscle weakness (generalized) Rehab Potential: Rehab Potential: Excellent ELOS: 10-14 days   Today's Date: 06/24/2013  Problem List:  Patient Active Problem List   Diagnosis Date Noted  . Cauda equina syndrome 06/22/2013  . Osteonecrosis 05/05/2013  . Small bowel obstruction 04/17/2013  . Acute renal failure 04/17/2013  . Medial meniscus, posterior horn derangement 02/17/2013  . Knee sprain and strain 02/17/2013  . Trigger point with neck pain 03/18/2012  . Rotator cuff tear arthropathy of right shoulder 09/08/2011  . CAD in native artery 01/09/2011  . Ankle fracture 12/25/2010  . Contusion of shoulder, left 12/25/2010  . PEMPHIGUS VULGARIS 07/02/2010  . MRSA 05/13/2010  . PRURITUS 05/13/2010  . SPINAL STENOSIS, LUMBAR 05/13/2010  . HYPERLIPIDEMIA 04/24/2010  . GASTROESOPHAGEAL REFLUX DISEASE 04/24/2010  . VENTRAL HERNIA, INCISIONAL 04/24/2010  . DEGENERATIVE JOINT DISEASE 04/24/2010  . PYOGENIC ARTHRITIS, SHOULDER REGION 02/04/2010  . HYPERTENSION 01/01/2010  . RUPTURE ROTATOR CUFF 02/19/2009    Past Medical History:  Past Medical History  Diagnosis Date  . Hyperlipidemia   . Small bowel problem     HAD ALOT OF SCAR TISSUE FROM PREVIOUS SURGERIES..NG WAS INSERTED ...Marland KitchenMarland KitchenNO SURGERY NEEDED...Marland KitchenMarland KitchenIN FOR 8 DAYS  . Disorder of blood     BEEN TREATED BY DERMATOLOGIST X 4 YRS..."BLOOD BLISTERS"  . Arthritis   . Gout   . Anxiety   . Hx MRSA infection     rt shoulder  . Pneumonia     "I've had it 3-4 times"  . Coronary artery disease     IN 2000   STENT PLACED IN 2012 sees Dr. Dietrich Pates, saw last 2013  . HTN (hypertension)     sees [name] in [place]  . Small bowel obstruction   . Ventral hernia   . Acute renal failure 04/17/2013  . AVN (avascular necrosis of bone)    bilateral hips   Past Surgical History:  Past Surgical History  Procedure Laterality Date  . Sternal surg  2000    HAS HAD 5-6 ON HIS STERNUM; "caught MRSA in it"  . Lumbar laminectomy/decompression microdiscectomy  06/13/2011    Procedure: LUMBAR LAMINECTOMY/DECOMPRESSION MICRODISCECTOMY;  Surgeon: Karn Cassis;  Location: [facility];  Service: Neurosurgery;  Laterality: N/A;  Lumbar three, lumbar four-five Laminectomy  . Eye surgery      bilateral cataract  . Anterior cervical corpectomy  12/17/11  . Incision and drainage of wound  ~ 20ll; 12/03/11    "had infection in my right"  . Shoulder open rotator cuff repair  ~ 2011    right  . Peripherally inserted central catheter insertion  2011 & 11/2011  . Cataract extraction w/ intraocular lens  implant, bilateral  ? 2011  . Coronary artery bypass graft  2000    CABG X5  . Back surgery      lumbar  . Tonsillectomy  1949  . Cholecystectomy  2006 "or after"  . Coronary angioplasty with stent placement  2012  . Anterior cervical corpectomy  12/17/2011    Procedure: ANTERIOR CERVICAL CORPECTOMY;  Surgeon: Karn Cassis, MD;  Location: [facility];  Service: Neurosurgery;  Laterality: N/A;  Anterior Cervical Decompression Fusion Five to Thoracic Two with plating  . Lumbar laminectomy/decompression microdiscectomy Right 06/17/2013    Procedure: LUMBAR LAMINECTOMY/DECOMPRESSION MICRODISCECTOMY 1 LEVEL;  Surgeon: Tanya Nones  Jeral Fruit, MD;  Location: [facility];  Service: Neurosurgery;  Laterality: Right;  Right L3-4 Microdiskectomy    Assessment & Plan Clinical Impression: [name] is a 72 y.o. male with history of CAD, HTN, SBO, Lumbar decompression 2/14 with left foot drop, RLE pain with radiation to the knee and falls due to severe weakness of ileo-psoas as well as quadriceps muscle. Patient also with history of RTC limiting RUE as well as partial resection of sternum due mediastinitis. 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 Jeral Fruit. Post op on with headaches and on bedrest for minimal CSF leak till 06/20/13. RLE weakness decreasing with decrease in back pain. Therapies initiated and patient with poor posture with inability to activate BLE due to sensory deficits as well as incontinent of bladder and bowel (lack of sensation reported). Foley removed today. Therapy initiated and rehab team recommended CIR. Patient transferred to CIR on 06/22/2013 .    Patient currently requires min assist -> total assist X2 with basic self-care skills and IADL secondary to muscle weakness, muscle joint tightness and muscle paralysis, unbalanced muscle activation, ataxia, decreased coordination and decreased motor planning and decreased sitting balance, decreased standing balance, decreased postural control, decreased balance strategies and difficulty maintaining precautions.  Prior to hospitalization, patient could complete ADLs and IADLs independently. 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   Patient will benefit from skilled intervention to increase independence with basic self-care skills prior to discharge home with care partner.  Anticipate patient will require 24 hour supervision and follow up home health.  OT - End of Session Activity Tolerance: Tolerates 10 - 20 min activity with multiple rests Endurance Deficit: Yes OT Assessment Rehab Potential: Excellent Barriers to Discharge:  (none known at this time) OT Patient demonstrates impairments in the following area(s): Balance;Endurance;Motor;Pain;Safety;Sensory;Skin Integrity OT Basic ADL's Functional Problem(s): Grooming;Bathing;Dressing;Toileting OT Advanced ADL's Functional Problem(s): Light Housekeeping OT Transfers Functional Problem(s): Toilet;Tub/Shower OT Additional Impairment(s): None (functional strengthening > BUEs) OT Plan OT  Intensity: Minimum of 1-2 x/day, 45 to 90 minutes OT Frequency: 5 out of 7 days OT Duration/Estimated Length of Stay: 10-14 days OT Treatment/Interventions: Metallurgist training;Community reintegration;Discharge planning;DME/adaptive equipment instruction;Functional mobility training;Neuromuscular re-education;Pain management;Patient/family education;Psychosocial support;Self Care/advanced ADL retraining;Skin care/wound managment;Splinting/orthotics;Therapeutic Exercise;Therapeutic Activities;UE/LE Strength taining/ROM;Wheelchair propulsion/positioning;UE/LE Coordination activities OT Self Feeding Anticipated Outcome(s): Independent OT Basic Self-Care Anticipated Outcome(s): Supervision -> min assist OT Toileting Anticipated Outcome(s): supervision OT Bathroom Transfers Anticipated Outcome(s): supervision OT Recommendation Patient destination: Home Follow Up Recommendations: Home health OT Equipment Recommended: Tub/shower seat  Precautions/Restrictions  Precautions Precautions: Back;Fall Precaution Comments: Reviewed back precautions with patient, patient able to verbalize 2/3 independently Restrictions Weight Bearing Restrictions: No  General Chart Reviewed: Yes Family/Caregiver Present: No  Pain Pain Assessment Pain Assessment: 0-10 Pain Score: 5  Pain Location: Back Pain Orientation: Lower;Mid Pain Descriptors / Indicators: Aching Pain Frequency: Intermittent Pain Onset: Gradual Patients Stated Pain Goal: 3 Pain Intervention(s): Medication (See eMAR)  Home Living/Prior Functioning Home Living Available Help at Discharge: Family;Available 24 hours/day Type of Home: House Home Access: Stairs to enter [employer] of Steps: 1 from back entrance; 3-4 from front entrance Entrance Stairs-Rails: None Home Layout: One level  Lives With: Spouse IADL History Homemaking Responsibilities: No Current License: Yes Mode of Transportation: Car Occupation: Part  time employment Type of Occupation: 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  Leisure and Hobbies: play golf, coach basketball, 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  Prior Function Level of Independence: Independent with basic ADLs;Independent with homemaking with ambulation;Independent with gait;Independent with transfers  Able to Take Stairs?: Yes Driving: Yes Vocation: Part time employment Leisure: Hobbies-yes (Comment) Comments: used to play golf, still works part-time  ADL - See FIM  Vision/Perception  Vision - History Baseline Vision: Wears glasses all the time Patient Visual Report: No change from baseline Vision - Assessment Eye Alignment: Within Functional Limits Perception Perception: Within Functional Limits Praxis Praxis: Intact   Cognition Overall Cognitive Status: Within Functional Limits for tasks assessed Orientation Level: Oriented X4 Memory: Appears intact Awareness: Appears intact Problem Solving: Appears intact Safety/Judgment: Appears intact  Sensation Sensation Light Touch: Impaired Detail Light Touch Impaired Details: Impaired RLE;Impaired LLE Proprioception: Impaired by gross assessment Additional Comments: BUEs appear intact. Patient states some numbness and tingling in right hand secondary to operation. Coordination Gross Motor Movements are Fluid and Coordinated: No (LE's) Fine Motor Movements are Fluid and Coordinated: Yes  Motor  Motor Motor: Ataxia;Abnormal postural alignment and control  Trunk/Postural Assessment  Cervical Assessment Cervical Assessment: Within Functional Limits Thoracic Assessment Thoracic Assessment: Within Functional Limits Lumbar Assessment Lumbar Assessment: Exceptions to [facility] (back precautions) Postural Control Postural Control: Deficits on evaluation (in standing decreased balance/trunk control)   Balance Balance Balance Assessed: Yes Static Sitting Balance Static Sitting - Level of Assistance: 5: Stand by  assistance Dynamic Sitting Balance Dynamic Sitting - Level of Assistance: 5: Stand by assistance Static Standing Balance Static Standing - Level of Assistance: 3: Mod assist Dynamic Standing Balance Dynamic Standing - Level of Assistance: 2: Max assist  Extremity/Trunk Assessment RUE Assessment RUE Assessment: Exceptions to [facility] RUE AROM (degrees) Overall AROM Right Upper Extremity: Deficits;Due to premorbid status (patient able to compensate and functional use BUEs for all needs) RUE PROM (degrees) Overall PROM Right Upper Extremity: Within functional limits for tasks performed LUE Assessment LUE Assessment: Exceptions to WFL LUE AROM (degrees) Overall AROM Left Upper Extremity: Deficits;Due to premorbid status (patient able to compensate and functional use BUEs for all needs) LUE PROM (degrees) Overall PROM Left Upper Extremity: Within functional limits for tasks assessed  FIM:  FIM - Grooming Grooming Steps: Wash, rinse, dry face;Wash, rinse, dry hands;Oral care, brush teeth, clean dentures;Brush, comb hair Grooming: 5: Set-up assist to obtain items FIM - Bathing Bathing Steps Patient Completed: Chest;Right Arm;Left Arm;Abdomen;Right upper leg;Left upper leg;Right lower leg (including foot);Left lower leg (including foot) Bathing: 1: Two helpers (total X2 for sit<>stand) FIM - Upper Body Dressing/Undressing Upper body dressing/undressing steps patient completed: Thread/unthread right sleeve of pullover shirt/dresss;Thread/unthread left sleeve of pullover shirt/dress;Put head through opening of pull over shirt/dress;Pull shirt over trunk Upper body dressing/undressing: 5: Set-up assist to: Obtain clothing/put away FIM - Lower Body Dressing/Undressing Lower body dressing/undressing steps patient completed: Thread/unthread right pants leg;Thread/unthread left pants leg;Don/Doff right sock;Don/Doff left sock Lower body dressing/undressing: 1: Two helpers (Total X2 for sit<>stand) FIM  - Toileting Toileting: 0: Activity did not occur FIM - Banker Devices: Arm rests;Orthosis Bed/Chair Transfer: 5: Supine > Sit: Supervision (verbal cues/safety issues);5: Sit > Supine: Supervision (verbal cues/safety issues);2: Chair or W/C > Bed: Max A (lift and lower assist);3: Bed > Chair or W/C: Mod A (lift or lower assist) (mod A squat pivot; max A stand pivot) FIM - Archivist Transfers: 0-Activity did not occur FIM - Tub/Shower Transfers Tub/shower Transfers: 0-Activity did not occur or was simulated   Refer to  Care Plan for Long Term Goals  Recommendations for other services: None at this time.  Discharge Criteria: Patient will be discharged from OT if patient refuses treatment 3 consecutive times without medical reason, if treatment goals not met, if there is a change in medical status, if patient makes no progress towards goals or if patient is discharged from hospital.  The above assessment, treatment plan, treatment alternatives and goals were discussed and mutually agreed upon: by patient  --------------------------------------------------------------------------------------------------------------------------------------  SESSION NOTES  Session #1 [phone] - 60 Minutes Individual Therapy  No complaints of pain Initial 1:1 occupational therapy evaluation completed. Focused skilled intervention on bed mobility, dynamic sitting balance, adhering to back precautions, edge of bed -> w/c transfer, bathing and dressing tasks at sink, grooming tasks at sink, sit<>stand using RW (patient required total assist X2 for safety), functional use of BUEs, and overall activity tolerance/endurance. Patient is very motivated to be as independent as possible. Goals set for an overall supervision -> min assist level. Patient states his wife can assist/provide supervision 24/7 at home. At end of session, left patient seated in w/c at sink to  finish grooming tasks with PT coming into room.   Session #2 1400 - 1430 - 30 Minutes Individual Therapy No complaints of pain Patient found seated in w/c upon entering room. Patient propelled self into bathroom for toilet transfer on/off elevated toilet seat using grab bars prn; patient stood for transfer. Patient then propelled w/c -> edge of bed for transfer onto bed and practice with EOB<>drop arm BSC transfers. Therapist recommended patient perform [facility] transfer for bowel program and not use bed pan; patient agreed. From here patient transferred back to w/c and propelled self -> therapy gym. Therapist educated patient on w/c pushups and bilateral bicep strengthening exercises. Patient also engaged in multiple sit<>stands from high/low table. Patient propelled self back to room and therapist left patient seated in w/c beside bed with all needed items within reach. Patient very thankful and eager/motivated to work with therapies.   Garcia Dalzell 06/24/2013, 12:15 PM

## 2013-06-24 NOTE — Evaluation (Signed)
Physical Therapy Assessment and Plan  Patient Details  Name: [name] MRN: [id number] Date of Birth: [birth year]  PT Diagnosis: Abnormality of gait, Ataxic gait, Difficulty walking, Impaired sensation, Low back pain and Muscle weakness Rehab Potential: Good ELOS: 10-14 days   Today's Date: 06/24/2013 Time: 0930-1026 Time Calculation (min): 56 min  Problem List:  Patient Active Problem List   Diagnosis Date Noted  . Cauda equina syndrome 06/22/2013  . Osteonecrosis 05/05/2013  . Small bowel obstruction 04/17/2013  . Acute renal failure 04/17/2013  . Medial meniscus, posterior horn derangement 02/17/2013  . Knee sprain and strain 02/17/2013  . Trigger point with neck pain 03/18/2012  . Rotator cuff tear arthropathy of right shoulder 09/08/2011  . CAD in native artery 01/09/2011  . Ankle fracture 12/25/2010  . Contusion of shoulder, left 12/25/2010  . PEMPHIGUS VULGARIS 07/02/2010  . MRSA 05/13/2010  . PRURITUS 05/13/2010  . SPINAL STENOSIS, LUMBAR 05/13/2010  . HYPERLIPIDEMIA 04/24/2010  . GASTROESOPHAGEAL REFLUX DISEASE 04/24/2010  . VENTRAL HERNIA, INCISIONAL 04/24/2010  . DEGENERATIVE JOINT DISEASE 04/24/2010  . PYOGENIC ARTHRITIS, SHOULDER REGION 02/04/2010  . HYPERTENSION 01/01/2010  . RUPTURE ROTATOR CUFF 02/19/2009    Past Medical History:  Past Medical History  Diagnosis Date  . Hyperlipidemia   . Small bowel problem     HAD ALOT OF SCAR TISSUE FROM PREVIOUS SURGERIES..NG WAS INSERTED ...Marland KitchenMarland KitchenNO SURGERY NEEDED...Marland KitchenMarland KitchenIN FOR 8 DAYS  . Disorder of blood     BEEN TREATED BY DERMATOLOGIST X 4 YRS..."BLOOD BLISTERS"  . Arthritis   . Gout   . Anxiety   . Hx MRSA infection     rt shoulder  . Pneumonia     "I've had it 3-4 times"  . Coronary artery disease     IN 2000   STENT PLACED IN 2012 sees Dr. Dietrich Pates, saw last 2013  . HTN (hypertension)     sees [name] in [place]  . Small bowel obstruction   . Ventral hernia   . Acute renal failure 04/17/2013   . AVN (avascular necrosis of bone)     bilateral hips   Past Surgical History:  Past Surgical History  Procedure Laterality Date  . Sternal surg  2000    HAS HAD 5-6 ON HIS STERNUM; "caught MRSA in it"  . Lumbar laminectomy/decompression microdiscectomy  06/13/2011    Procedure: LUMBAR LAMINECTOMY/DECOMPRESSION MICRODISCECTOMY;  Surgeon: Karn Cassis;  Location: [facility];  Service: Neurosurgery;  Laterality: N/A;  Lumbar three, lumbar four-five Laminectomy  . Eye surgery      bilateral cataract  . Anterior cervical corpectomy  12/17/11  . Incision and drainage of wound  ~ 20ll; 12/03/11    "had infection in my right"  . Shoulder open rotator cuff repair  ~ 2011    right  . Peripherally inserted central catheter insertion  2011 & 11/2011  . Cataract extraction w/ intraocular lens  implant, bilateral  ? 2011  . Coronary artery bypass graft  2000    CABG X5  . Back surgery      lumbar  . Tonsillectomy  1949  . Cholecystectomy  2006 "or after"  . Coronary angioplasty with stent placement  2012  . Anterior cervical corpectomy  12/17/2011    Procedure: ANTERIOR CERVICAL CORPECTOMY;  Surgeon: Karn Cassis, MD;  Location: [facility];  Service: Neurosurgery;  Laterality: N/A;  Anterior Cervical Decompression Fusion Five to Thoracic Two with plating  . Lumbar laminectomy/decompression microdiscectomy Right 06/17/2013  Procedure: LUMBAR LAMINECTOMY/DECOMPRESSION MICRODISCECTOMY 1 LEVEL;  Surgeon: Karn Cassis, MD;  Location: [facility];  Service: Neurosurgery;  Laterality: Right;  Right L3-4 Microdiskectomy    Assessment & Plan Clinical Impression: Patient is a 72 y.o. year old male with recent admission to the hospital on with history of CAD, HTN, SBO, Lumbar decompression 2/14 with left foot drop, RLE pain with radiation to the knee and falls due to severe weakness of ileo-psoas as well as quadriceps muscle. Patient also with history of RTC limiting RUE as well as partial  resection of sternum due mediastinitis. 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 Jeral Fruit. Post op on with headaches and on bedrest for minimal CSF leak till 06/20/13. RLE weakness decreasing with decrease in back pain. Therapies initiated and patient with poor posture with inability to activate BLE due to sensory deficits as well as incontinent of bladder and bowel (lack of sensation reported). Foley removed today. Patient transferred to CIR on 06/22/2013 .   Patient currently requires mod to max A with mobility secondary to muscle weakness, muscle joint tightness and muscle paralysis, decreased cardiorespiratoy endurance, ataxia and decreased coordination and decreased sitting balance, decreased standing balance, decreased postural control, decreased balance strategies and difficulty maintaining precautions.  Prior to hospitalization, patient was modified independent  with mobility and lived with Spouse in a House home.  Home access is 1 from back entrance; 3-4 from front entranceStairs to enter.  Patient will benefit from skilled PT intervention to maximize safe functional mobility, minimize fall risk and decrease caregiver burden for planned discharge home with 24 hour supervision.  Anticipate patient will benefit from follow up HH at discharge.  PT - End of Session Activity Tolerance: Decreased this session Endurance Deficit: Yes PT Assessment Rehab Potential: Good PT Patient demonstrates impairments in the following area(s): Balance;Endurance;Motor;Safety;Sensory;Skin Integrity;Pain PT Transfers Functional Problem(s): Bed Mobility;Bed to Chair;Car;Furniture PT Locomotion Functional Problem(s): Ambulation;Wheelchair Mobility;Stairs PT Plan PT Intensity: Minimum of 1-2 x/day ,45 to 90 minutes PT Frequency: 5 out of 7 days PT Duration Estimated Length of Stay: 10-14 days PT Treatment/Interventions:  Ambulation/gait training;Balance/vestibular training;Community reintegration;Discharge planning;Disease management/prevention;DME/adaptive equipment instruction;Functional mobility training;Neuromuscular re-education;Pain management;Patient/family education;Psychosocial support;Skin care/wound management;Splinting/orthotics;Stair training;Therapeutic Activities;Therapeutic Exercise;UE/LE Strength taining/ROM;UE/LE Coordination activities;Wheelchair propulsion/positioning PT Transfers Anticipated Outcome(s): mod I w/c level PT Locomotion Anticipated Outcome(s): mod I w/c mobility; S/min A gait PT Recommendation Follow Up Recommendations: Home health PT Patient destination: Home Equipment Recommended: Wheelchair cushion (measurements);Wheelchair (measurements) Equipment Details: Pt already owns RW  Skilled Therapeutic Intervention  Session focused on functional transfers with emphasis on hand placement and technique (squat pivot vs. Stand pivot), sit to stands with RW, toilet transfers and dynamic balance for hygiene/clothing management, w/c propulsion, and overall endurance/activity tolerance. Pt with ataxic LE movements during standing/gait activities requiring mod to max A to maintain balance during transitional movements.   PT Evaluation Precautions/Restrictions Precautions Precautions: Back;Fall Precaution Comments: Reviewed back precautions with patient, patient able to verbalize 2/3 independently Restrictions Weight Bearing Restrictions: No Pain Denies pain during session but reports prior back pain. Home Living/Prior Functioning Home Living Available Help at Discharge: Family;Available 24 hours/day Type of Home: House Home Access: Stairs to enter [employer] of Steps: 1 from back entrance; 3-4 from front entrance Entrance Stairs-Rails: None Home Layout: One level  Lives With: Spouse Prior Function Level of Independence: Independent with basic ADLs;Independent with  homemaking with ambulation;Independent with  gait;Independent with transfers  Able to Take Stairs?: Yes Driving: Yes Vocation: Part time employment Leisure: Hobbies-yes (Comment) Comments: used to play golf, still works Psychiatrist - History Baseline Vision: Wears glasses all the time Patient Visual Report: No change from baseline Vision - Assessment Eye Alignment: Within Functional Limits Perception Perception: Within Functional Limits Praxis Praxis: Intact  Cognition Overall Cognitive Status: Within Functional Limits for tasks assessed Orientation Level: Oriented X4 Memory: Appears intact Awareness: Appears intact Problem Solving: Appears intact Safety/Judgment: Appears intact Sensation Sensation Light Touch: Impaired Detail Light Touch Impaired Details: Impaired RLE;Impaired LLE Proprioception: Impaired by gross assessment Additional Comments: BUEs appear intact. Patient states some numbness and tingling in right hand secondary to operation. Coordination Gross Motor Movements are Fluid and Coordinated: No (LE's) Fine Motor Movements are Fluid and Coordinated: Yes Motor  Motor Motor: Ataxia;Abnormal postural alignment and control  Locomotion  Ambulation Ambulation/Gait Assistance: 2: Max assist (with RW; very ataxic)  Trunk/Postural Assessment  Cervical Assessment Cervical Assessment: Within Functional Limits Thoracic Assessment Thoracic Assessment: Within Functional Limits Lumbar Assessment Lumbar Assessment: Exceptions to [facility] (back precautions) Postural Control Postural Control: Deficits on evaluation (in standing decreased balance/trunk control)  Balance Balance Balance Assessed: Yes Static Sitting Balance Static Sitting - Level of Assistance: 5: Stand by assistance Dynamic Sitting Balance Dynamic Sitting - Level of Assistance: 5: Stand by assistance Static Standing Balance Static Standing - Level of Assistance: 3: Mod assist Dynamic  Standing Balance Dynamic Standing - Level of Assistance: 2: Max assist Extremity Assessment  RUE Assessment RUE Assessment: Exceptions to [facility] RUE AROM (degrees) Overall AROM Right Upper Extremity: Deficits;Due to premorbid status (patient able to compensate and functional use BUEs for all needs) RUE PROM (degrees) Overall PROM Right Upper Extremity: Within functional limits for tasks performed LUE Assessment LUE Assessment: Exceptions to WFL LUE AROM (degrees) Overall AROM Left Upper Extremity: Deficits;Due to premorbid status (patient able to compensate and functional use BUEs for all needs) LUE PROM (degrees) Overall PROM Left Upper Extremity: Within functional limits for tasks assessed RLE Assessment RLE Assessment: Exceptions to [facility] RLE Strength RLE Overall Strength Comments: grossly 3+ to 4-/5; decreased coordination LLE Assessment LLE Assessment: Exceptions to [facility] LLE Strength LLE Overall Strength Comments: grossly 3 to 3+/5 hip and knee; foot drop with L AFO  FIM:  FIM - Bed/Chair Transfer Bed/Chair Transfer Assistive Devices: Arm rests;Orthosis Bed/Chair Transfer: 5: Supine > Sit: Supervision (verbal cues/safety issues);5: Sit > Supine: Supervision (verbal cues/safety issues);2: Chair or W/C > Bed: Max A (lift and lower assist);3: Bed > Chair or W/C: Mod A (lift or lower assist) (mod A squat pivot; max A stand pivot) FIM - Locomotion: Wheelchair Locomotion: Wheelchair: 2: Travels 50 - 149 ft with supervision, cueing or coaxing FIM - Locomotion: Ambulation Locomotion: Ambulation Assistive Devices: Walker - Rolling;Orthosis Ambulation/Gait Assistance: 2: Max assist (with RW; very ataxic) Locomotion: Ambulation: 1: Travels less than 50 ft with maximal assistance (Pt: 25 - 49%)   Refer to Care Plan for Long Term Goals  Recommendations for other services: None  Discharge Criteria: Patient will be discharged from PT if patient refuses treatment 3 consecutive times without  medical reason, if treatment goals not met, if there is a change in medical status, if patient makes no progress towards goals or if patient is discharged from hospital.  The above assessment, treatment plan, treatment alternatives and goals were discussed and mutually agreed upon: by patient  Tedd Sias 06/24/2013, 10:38 AM

## 2013-06-24 NOTE — Progress Notes (Signed)
Physical Therapy Session Note  Patient Details  Name: [name] MRN: [id number] Date of Birth: [birth year]  Today's Date: 06/24/2013 Time: 9811-9147 Time Calculation (min): 45 min  Short Term Goals: Week 1:  PT Short Term Goal 1 (Week 1): Pt will be able to complete squat pivot transfer with S PT Short Term Goal 2 (Week 1): Pt will be able to complete stand pivot transfer with min A PT Short Term Goal 3 (Week 1): Pt will gait x 20' with min A PT Short Term Goal 4 (Week 1): Pt will propel w/c on unit > 150' mod I  Skilled Therapeutic Interventions/Progress Updates:    Scoot transfers min assist nearing supervision, PT to set up wheelchair and provide sequencing cues. Sit <> stands multiple bouts from elevated mat and wheelchair focusing on pre-stand and transitional mechanics. Bil. Hip abduction against theraband tied around knees which decreased some ataxic movements and decreased adduction and hip internal rotation.   Ambulation x 3', 8', 9', 8' with RW and max assist with two persons for safety and chair to follow initially. Pt severely ataxic Lt. LE> Rt.LE, scissoring gait, overall poor LE proprioception and controlled placement. Applied 4# ankle weights to bil. LEs with significant improvement - decreased ataxia and improved ability to appropriately place feet however remains very limited by quick fatigue.   Pt appears anxious - on the verge of impulsive with movements, but responds well to cues. Pt reports he is used to going 100 mph all the time.   Therapy Documentation Precautions:  Precautions Precautions: Back;Fall Precaution Comments: Reviewed back precautions with patient, patient able to verbalize 2/3 independently Restrictions Weight Bearing Restrictions: No Pain: Pain Assessment Pain Assessment: No/denies pain  See FIM for current functional status  Therapy/Group: Individual Therapy  Wilhemina Bonito 06/24/2013, 3:13 PM

## 2013-06-24 NOTE — Progress Notes (Signed)
Patient ID: [name], male   DOB: 10-23-1940, 72 y.o.   MRN: [id number] Better, no pain in right leg. Progressing neurologically

## 2013-06-25 ENCOUNTER — Inpatient Hospital Stay (HOSPITAL_COMMUNITY): Payer: Medicare Other | Admitting: Physical Therapy

## 2013-06-25 ENCOUNTER — Inpatient Hospital Stay (HOSPITAL_COMMUNITY): Payer: Medicare Other | Admitting: *Deleted

## 2013-06-25 MED ORDER — BETHANECHOL CHLORIDE 25 MG PO TABS
50.0000 mg | ORAL_TABLET | Freq: Three times a day (TID) | ORAL | Status: DC
  Administered 2013-06-25 – 2013-06-27 (×8): 50 mg via ORAL
  Filled 2013-06-25 (×12): qty 2

## 2013-06-25 NOTE — Progress Notes (Signed)
Subjective/Complaints: Participating well with physical therapy. No complaints today. Thinks he may be getting some more sensation in the right lower extremity Appreciate neurosurgery note Review of Systems - Negative except Weakness in legs, numbness left foot, problems with voiding Objective: Vital Signs: Blood pressure 162/85, pulse 65, temperature 97.8 F (36.6 C), temperature source Oral, resp. rate 18, height 6' (1.829 m), weight 83.8 kg (184 lb 11.9 oz), SpO2 95.00%. No results found. Results for orders placed during the hospital encounter of 06/22/13 (from the past 72 hour(s))  GLUCOSE, CAPILLARY     Status: Abnormal   Collection Time    06/23/13  8:14 PM      Result Value Range   Glucose-Capillary 200 (*) 70 - 99 mg/dL  CBC WITH DIFFERENTIAL     Status: Abnormal   Collection Time    06/24/13  5:27 AM      Result Value Range   WBC 8.4  4.0 - 10.5 K/uL   RBC 3.65 (*) 4.22 - 5.81 MIL/uL   Hemoglobin 11.2 (*) 13.0 - 17.0 g/dL   HCT 40.9 (*) 81.1 - 91.4 %   MCV 90.4  78.0 - 100.0 fL   MCH 30.7  26.0 - 34.0 pg   MCHC 33.9  30.0 - 36.0 g/dL   RDW 78.2  95.6 - 21.3 %   Platelets 271  150 - 400 K/uL   Neutrophils Relative % 66  43 - 77 %   Neutro Abs 5.5  1.7 - 7.7 K/uL   Lymphocytes Relative 18  12 - 46 %   Lymphs Abs 1.5  0.7 - 4.0 K/uL   Monocytes Relative 11  3 - 12 %   Monocytes Absolute 0.9  0.1 - 1.0 K/uL   Eosinophils Relative 4  0 - 5 %   Eosinophils Absolute 0.3  0.0 - 0.7 K/uL   Basophils Relative 1  0 - 1 %   Basophils Absolute 0.0  0.0 - 0.1 K/uL  COMPREHENSIVE METABOLIC PANEL     Status: Abnormal   Collection Time    06/24/13  5:27 AM      Result Value Range   Sodium 141  135 - 145 mEq/L   Potassium 4.4  3.5 - 5.1 mEq/L   Chloride 104  96 - 112 mEq/L   CO2 30  19 - 32 mEq/L   Glucose, Bld 117 (*) 70 - 99 mg/dL   BUN 20  6 - 23 mg/dL   Creatinine, Ser 0.86  0.50 - 1.35 mg/dL   Calcium 9.2  8.4 - 57.8 mg/dL   Total Protein 5.5 (*) 6.0 - 8.3 g/dL   Albumin 2.6 (*) 3.5 - 5.2 g/dL   AST 24  0 - 37 U/L   ALT 21  0 - 53 U/L   Alkaline Phosphatase 70  39 - 117 U/L   Total Bilirubin 0.3  0.3 - 1.2 mg/dL   GFR calc non Af Amer 60 (*) >90 mL/min   GFR calc Af Amer 70 (*) >90 mL/min   Comment: (NOTE)     The eGFR has been calculated using the CKD EPI equation.     This calculation has not been validated in all clinical situations.     eGFR's persistently <90 mL/min signify possible Chronic Kidney     Disease.  GLUCOSE, CAPILLARY     Status: Abnormal   Collection Time    06/24/13  7:29 AM      Result Value Range   Glucose-Capillary 106 (*)  70 - 99 mg/dL      Nursing note and vitals reviewed.  Constitutional: He is oriented to person, place, and time. He appears well-developed and well-nourished.  HENT:  Head: Normocephalic and atraumatic.  Mouth/Throat: Oropharynx is clear and moist.  Wears upper dentures and partial on lower  Eyes: Conjunctivae are normal. Pupils are equal, round, and reactive to light.  Neck: Normal range of motion. Neck supple.  Cardiovascular: Normal rate and regular rhythm.  Respiratory: Effort normal and breath sounds normal. No respiratory distress. He has no wheezes.  Skin: Midline lumbar incision small amount of serous drainage on 4 x 4, no bleeding, no wound dehiscence GI: Soft. Bowel sounds are normal. He exhibits no distension. There is no tenderness.  Large midline incision with large incision hernia (wears corset for support). Multiple well healed old scars.  Musculoskeletal: He exhibits no edema and no tenderness.  Neurological: He is alert and oriented to person, place, and time.  Speech clear. Follows commands without difficulty. Decreased sensation saddle area--lax anal sphincter with decreased tone--stool felt in rectal vault.  Left foot drop.  Decreased S1 and L5 dermatomes sensationLeft Motor strength is 3 minus right deltoid for at left deltoid 4/5 bilateral bicep triceps and grip 4- hip  flexors,4- bilateral knee extensors and right ankle dorsiflexor and plantar flexor 0/5 left ankle dorsiflexor and plantar flexor  Skin: Skin is warm and dry.    Assessment/Plan: 1. Functional deficits secondary to cauda equina syndrome which require 3+ hours per day of interdisciplinary therapy in a comprehensive inpatient rehab setting. Physiatrist is providing close team supervision and 24 hour management of active medical problems listed below. Physiatrist and rehab team continue to assess barriers to discharge/monitor patient progress toward functional and medical goals. FIM: FIM - Bathing Bathing Steps Patient Completed: Chest;Right Arm;Left Arm;Abdomen;Right upper leg;Left upper leg;Right lower leg (including foot);Left lower leg (including foot) Bathing: 1: Two helpers (total X2 for sit<>stand)  FIM - Upper Body Dressing/Undressing Upper body dressing/undressing steps patient completed: Thread/unthread right sleeve of pullover shirt/dresss;Thread/unthread left sleeve of pullover shirt/dress;Put head through opening of pull over shirt/dress;Pull shirt over trunk Upper body dressing/undressing: 5: Set-up assist to: Obtain clothing/put away FIM - Lower Body Dressing/Undressing Lower body dressing/undressing steps patient completed: Thread/unthread right pants leg;Thread/unthread left pants leg;Don/Doff right sock;Don/Doff left sock Lower body dressing/undressing: 1: Two helpers (Total X2 for sit<>stand)  FIM - Toileting Toileting: 0: Activity did not occur  FIM - Diplomatic Services operational officer Devices: Bedside commode;Elevated toilet seat;Grab bars Toilet Transfers: 4-To toilet/BSC: Min A (steadying Pt. > 75%);4-From toilet/BSC: Min A (steadying Pt. > 75%)  FIM - Bed/Chair Transfer Bed/Chair Transfer Assistive Devices: Arm rests;Orthosis Bed/Chair Transfer: 5: Supine > Sit: Supervision (verbal cues/safety issues);4: Bed > Chair or W/C: Min A (steadying Pt. > 75%);4:  Chair or W/C > Bed: Min A (steadying Pt. > 75%)  FIM - Locomotion: Wheelchair Locomotion: Wheelchair: 2: Travels 50 - 149 ft with supervision, cueing or coaxing FIM - Locomotion: Ambulation Locomotion: Ambulation Assistive Devices: Walker - Rolling;Orthosis Ambulation/Gait Assistance: 2: Max assist (with RW; very ataxic) Locomotion: Ambulation: 1: Two helpers  Comprehension Comprehension Mode: Auditory Comprehension: 5-Follows basic conversation/direction: With no assist  Expression Expression Mode: Verbal Expression: 5-Expresses basic needs/ideas: With extra time/assistive device  Social Interaction Social Interaction: 7-Interacts appropriately with others - No medications needed.  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  1. DVT Prophylaxis/Anticoagulation: Pharmaceutical: Lovenox  2. Chronic pain/Pain Management: Has been on steroids X 1 year for pain management--willing to attempt taper during hospitalization. Continue neurontin for neuropathy. Prn oxcodone for pain effective.  3. Anxiety disorder/ Mood: Provide ego support as has lot of anxiety about current condition. Continue celexa. LCSW to follow for evaluation.  4. Neuropsych: This patient is capable of making decisions on his own behalf.  5. HTN: monitor with bid checks. Continue Cardura.  6. Neurogenic B/B: Augment bowel program with suppository daily. Also has h/o BPH--Check PVRs. Change flomax to HS. Check UA/UCS.Urecholine  7. 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      LOS (Days) 3 A FACE TO FACE EVALUATION WAS PERFORMED  Eaden Hettinger E 06/25/2013, 10:16 AM

## 2013-06-25 NOTE — Progress Notes (Signed)
Occupational Therapy Note Patient Details  Name: LASZLO ELLERBY MRN: [id number] Date of Birth: 1940-12-08 Today's Date: 06/25/2013  Time: 6962-9528  (45 min)  1st session Pain:  3/10 low back pain Individual session  1st session:  Addressed wc mobility, standing balance, standing tolerance.  Engaged in bathing and dressing at wc level.  Pt stood with walker for 30 seconds to don pants. Pt. Did lower body dressing using crossed leg technique.  Recalled 2/3 back precautions.  Pt propelled wc from room to family room and prepared cup of coffee.   Time:  1430-1500  (30 min)  2nd session Pain:  Low back 4/10 Individual session  2nd session: Engaged in standing balance activitiies at high low table.  Pt. Stood x 3 for , 1.5 minutes, 3 minutes.  Used one hand to hold to while standing.  Propelled wc back to bed.  Transferred wc to bed with mod assist.  Let in bed awaiting nursing for cath.  Had phone and call bell,phone within reach.         Humberto Seals 06/25/2013, 10:30 AM

## 2013-06-25 NOTE — Progress Notes (Signed)
Physical Therapy Session Note  Patient Details  Name: [name] MRN: [id number] Date of Birth: January 10, 1941  Today's Date: 06/25/2013 Time: 0730-0830 Time Calculation (min): 60 min  Short Term Goals: Week 1:  PT Short Term Goal 1 (Week 1): Pt will be able to complete squat pivot transfer with S PT Short Term Goal 2 (Week 1): Pt will be able to complete stand pivot transfer with min A PT Short Term Goal 3 (Week 1): Pt will gait x 20' with min A PT Short Term Goal 4 (Week 1): Pt will propel w/c on unit > 150' mod I  Therapy Documentation Precautions:  Precautions Precautions: Back;Fall Precaution Comments: Reviewed back precautions with patient, patient able to verbalize 2/3 independently Restrictions Weight Bearing Restrictions: No Vital Signs: Therapy Vitals Temp: 97.8 F (36.6 C) Temp src: Oral Pulse Rate: 65 Resp: 18 BP: 162/85 mmHg Patient Position, if appropriate: Lying Oxygen Therapy SpO2: 95 % Pain: Pain Assessment Pain Assessment: 0-10 Pain Score: 4  Pain Type: Surgical pain Pain Location: Back Pain Orientation: Mid;Lower Pain Descriptors / Indicators: Aching Pain Frequency: Intermittent Pain Onset: Gradual Patients Stated Pain Goal: 3 Pain Intervention(s): Medication (See eMAR)  Therapeutic Activity:(30') Transfer Training sit<->stand min-assist, toilet transfers with min-assist with patient using grab bars but patient needing Max-assist for doffing/donning pants. Bed<->w/c and w/c<->chair with S/Mod-I via scooting/squat pivot transfers  Therapeutic Exercise:(15') Nu-Step Level 4 x 10 minutes with 2 rest breaks Gait Training:(15') using RW 2 x 40' with min-assist for steadying posture. Patient has ataxic gait with poor placement of feet.   Therapy/Group: Individual Therapy  Rex Kras 06/25/2013, 7:33 AM

## 2013-06-25 NOTE — Progress Notes (Signed)
Physical Therapy Session Note  Patient Details  Name: [name] MRN: [id number] Date of Birth: 10-18-1940  Today's Date: 06/25/2013 Time: 1330-1415 Time Calculation (min): 45 min  Short Term Goals: Week 1:  PT Short Term Goal 1 (Week 1): Pt will be able to complete squat pivot transfer with S PT Short Term Goal 2 (Week 1): Pt will be able to complete stand pivot transfer with min A PT Short Term Goal 3 (Week 1): Pt will gait x 20' with min A PT Short Term Goal 4 (Week 1): Pt will propel w/c on unit > 150' mod I  Therapy Documentation Precautions:  Precautions Precautions: Back;Fall Precaution Comments: Reviewed back precautions with patient, patient able to verbalize 2/3 independently Restrictions Weight Bearing Restrictions: No Pain: denies pain  Gait Training:(15') using RW (3# ankle wt on L LE, 4# on R LE) ambulated 2 x 20' with DOE and early fatigue. Ankle weights on ankles assisted with keeping LE's from scissoring as much. Therapeutic Activity:(15') Transfer training multiple sit<->stand into RW with S/min-assist. Transfer w/c<->chair via squat-pivot transfer with S/min-guard assist Therapeutic Exercise:(15') Nu-Step Level 6 x 10 minutes with rest 1x   Therapy/Group: Individual Therapy  Mayanna Garlitz J 06/25/2013, 1:39 PM

## 2013-06-26 NOTE — IPOC Note (Addendum)
Overall Plan of Care [facility]) Patient Details Name: [name] MRN: [id number] DOB: [birth year]  Admitting Diagnosis: cauda equina syndrome  Hospital Problems: Active Problems:   Cauda equina syndrome     Functional Problem List: Nursing Bladder;Bowel;Endurance;Medication Management;Motor;Pain;Safety;Sensory;Skin Integrity  PT Balance;Endurance;Motor;Safety;Sensory;Skin Integrity;Pain  OT Balance;Endurance;Motor;Pain;Safety;Sensory;Skin Integrity  SLP    TR         Basic ADL's: OT Grooming;Bathing;Dressing;Toileting     Advanced  ADL's: OT Light Housekeeping     Transfers: PT Bed Mobility;Bed to Chair;Car;Furniture  OT Toilet;Tub/Shower     Locomotion: PT Ambulation;Wheelchair Mobility;Stairs     Additional Impairments: OT None (functional strengthening > BUEs)  SLP        TR      Anticipated Outcomes Item Anticipated Outcome  Self Feeding Independent  Swallowing      Basic self-care  Supervision -> min assist  Toileting  supervision   Bathroom Transfers supervision  Bowel/Bladder  min assist  Transfers  mod I w/c level  Locomotion  mod I w/c mobility; S/min A gait  Communication     Cognition     Pain  <3 on a 0-10 scale  Safety/Judgment  min assist   Therapy Plan: PT Intensity: Minimum of 1-2 x/day ,45 to 90 minutes PT Frequency: 5 out of 7 days PT Duration Estimated Length of Stay: 10-14 days OT Intensity: Minimum of 1-2 x/day, 45 to 90 minutes OT Frequency: 5 out of 7 days OT Duration/Estimated Length of Stay: 10-14 days         Team Interventions: Nursing Interventions Bladder Management;Bowel Management;Disease Management/Prevention;Pain Management;Medication Management;Skin Care/Wound Management;Patient/Family Education  PT interventions Ambulation/gait training;Balance/vestibular training;Community reintegration;Discharge planning;Disease management/prevention;DME/adaptive equipment instruction;Functional mobility  training;Neuromuscular re-education;Pain management;Patient/family education;Psychosocial support;Skin care/wound management;Splinting/orthotics;Stair training;Therapeutic Activities;Therapeutic Exercise;UE/LE Strength taining/ROM;UE/LE Coordination activities;Wheelchair propulsion/positioning  OT Interventions Balance/vestibular training;Community reintegration;Discharge planning;DME/adaptive equipment instruction;Functional mobility training;Neuromuscular re-education;Pain management;Patient/family education;Psychosocial support;Self Care/advanced ADL retraining;Skin care/wound managment;Splinting/orthotics;Therapeutic Exercise;Therapeutic Activities;UE/LE Strength taining/ROM;Wheelchair propulsion/positioning;UE/LE Coordination activities  SLP Interventions    TR Interventions  therapeutic activities, recreation/leisure participation, community reintegration, pt/family education, Clinical research associate, psychosocial support  SW/CM Interventions      Team Discharge Planning: Destination: PT-Home ,OT- Home , SLP-  Projected Follow-up: PT-Home health PT, OT-  Home health OT, SLP-  Projected Equipment Needs: PT-Wheelchair cushion (measurements);Wheelchair (measurements), OT- Tub/shower seat, SLP-  Equipment Details: PT-Pt already owns RW, OT-  Patient/family involved in discharge planning: PT- Patient,  OT-Patient, SLP-   MD ELOS: 10-14wk Medical Rehab Prognosis:  Good Assessment: 72 y.o. male with history of CAD, HTN, SBO, Lumbar decompression 2/14 with left foot drop, RLE pain with radiation to the knee and falls due to severe weakness of ileo-psoas as well as quadriceps muscle. Patient also with history of RTC limiting RUE as well as partial resection of sternum due mediastinitis. 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 Jeral Fruit. Post op on with headaches and on bedrest for minimal CSF leak  till 06/20/13. RLE weakness decreasing with decrease in back pain  Now requiring 24/7 Rehab RN,MD, as well as CIR level PT, OT and SLP.  Treatment team will focus on ADLs and mobility with goals set at min/sup    See Team Conference Notes for weekly updates to the plan of care

## 2013-06-26 NOTE — Progress Notes (Signed)
At 2200 complained of "feeling full". Bladder scan=965, I & O cath=1000!!! 2 oxy ir given at 2203 for complaint of BLE pain. Dry dressing to Lower back incision. Alfredo Martinez A

## 2013-06-26 NOTE — Progress Notes (Signed)
Subjective/Complaints: Participating well with physical therapy. No complaints today. Thinks he may be getting some more sensation in the right lower extremity Appreciate neurosurgery note Review of Systems - Negative except Weakness in legs, numbness left foot, problems with voiding Objective: Vital Signs: Blood pressure 151/71, pulse 66, temperature 98 F (36.7 C), temperature source Oral, resp. rate 17, height 6' (1.829 m), weight 83.8 kg (184 lb 11.9 oz), SpO2 96.00%. No results found. Results for orders placed during the hospital encounter of 06/22/13 (from the past 72 hour(s))  GLUCOSE, CAPILLARY     Status: Abnormal   Collection Time    06/23/13  8:14 PM      Result Value Range   Glucose-Capillary 200 (*) 70 - 99 mg/dL  CBC WITH DIFFERENTIAL     Status: Abnormal   Collection Time    06/24/13  5:27 AM      Result Value Range   WBC 8.4  4.0 - 10.5 K/uL   RBC 3.65 (*) 4.22 - 5.81 MIL/uL   Hemoglobin 11.2 (*) 13.0 - 17.0 g/dL   HCT 82.9 (*) 56.2 - 13.0 %   MCV 90.4  78.0 - 100.0 fL   MCH 30.7  26.0 - 34.0 pg   MCHC 33.9  30.0 - 36.0 g/dL   RDW 86.5  78.4 - 69.6 %   Platelets 271  150 - 400 K/uL   Neutrophils Relative % 66  43 - 77 %   Neutro Abs 5.5  1.7 - 7.7 K/uL   Lymphocytes Relative 18  12 - 46 %   Lymphs Abs 1.5  0.7 - 4.0 K/uL   Monocytes Relative 11  3 - 12 %   Monocytes Absolute 0.9  0.1 - 1.0 K/uL   Eosinophils Relative 4  0 - 5 %   Eosinophils Absolute 0.3  0.0 - 0.7 K/uL   Basophils Relative 1  0 - 1 %   Basophils Absolute 0.0  0.0 - 0.1 K/uL  COMPREHENSIVE METABOLIC PANEL     Status: Abnormal   Collection Time    06/24/13  5:27 AM      Result Value Range   Sodium 141  135 - 145 mEq/L   Potassium 4.4  3.5 - 5.1 mEq/L   Chloride 104  96 - 112 mEq/L   CO2 30  19 - 32 mEq/L   Glucose, Bld 117 (*) 70 - 99 mg/dL   BUN 20  6 - 23 mg/dL   Creatinine, Ser 2.95  0.50 - 1.35 mg/dL   Calcium 9.2  8.4 - 28.4 mg/dL   Total Protein 5.5 (*) 6.0 - 8.3 g/dL   Albumin  2.6 (*) 3.5 - 5.2 g/dL   AST 24  0 - 37 U/L   ALT 21  0 - 53 U/L   Alkaline Phosphatase 70  39 - 117 U/L   Total Bilirubin 0.3  0.3 - 1.2 mg/dL   GFR calc non Af Amer 60 (*) >90 mL/min   GFR calc Af Amer 70 (*) >90 mL/min   Comment: (NOTE)     The eGFR has been calculated using the CKD EPI equation.     This calculation has not been validated in all clinical situations.     eGFR's persistently <90 mL/min signify possible Chronic Kidney     Disease.  GLUCOSE, CAPILLARY     Status: Abnormal   Collection Time    06/24/13  7:29 AM      Result Value Range   Glucose-Capillary  106 (*) 70 - 99 mg/dL      Nursing note and vitals reviewed.  Constitutional: He is oriented to person, place, and time. He appears well-developed and well-nourished.  HENT:  Head: Normocephalic and atraumatic.  Mouth/Throat: Oropharynx is clear and moist.  Wears upper dentures and partial on lower  Eyes: Conjunctivae are normal. Pupils are equal, round, and reactive to light.   Cardiovascular: Normal rate and regular rhythm.  Respiratory: Effort normal and breath sounds normal. No respiratory distress. He has no wheezes.  Skin: Midline lumbar incision small amount of serous drainage on 4 x 4, no bleeding, no wound dehiscence GI: Soft. Bowel sounds are normal. He exhibits no distension. There is no tenderness.  Large midline incision with large incision hernia (wears corset for support). Multiple well healed old scars.  Musculoskeletal: He exhibits no edema and no tenderness.  Neurological: He is alert and oriented to person, place, and time.  Speech clear. Follows commands without difficulty. Decreased sensation saddle area--lax anal sphincter with decreased tone--stool felt in rectal vault.  Left foot drop.  Decreased S1 and L5 dermatomes sensationLeft Motor strength is 3 minus right deltoid for at left deltoid 4/5 bilateral bicep triceps and grip 4- hip flexors,4- bilateral knee extensors and right ankle  dorsiflexor and plantar flexor 0/5 left ankle dorsiflexor and plantar flexor  Skin: Skin is warm and dry.    Assessment/Plan: 1. Functional deficits secondary to cauda equina syndrome which require 3+ hours per day of interdisciplinary therapy in a comprehensive inpatient rehab setting. Physiatrist is providing close team supervision and 24 hour management of active medical problems listed below. Physiatrist and rehab team continue to assess barriers to discharge/monitor patient progress toward functional and medical goals. FIM: FIM - Bathing Bathing Steps Patient Completed: Chest;Right Arm;Left Arm;Abdomen;Right upper leg;Left upper leg;Right lower leg (including foot);Left lower leg (including foot) Bathing: 3: Mod-Patient completes 5-7 101f 10 parts or 50-74%  FIM - Upper Body Dressing/Undressing Upper body dressing/undressing steps patient completed: Thread/unthread right sleeve of pullover shirt/dresss;Thread/unthread left sleeve of pullover shirt/dress;Put head through opening of pull over shirt/dress;Pull shirt over trunk Upper body dressing/undressing: 5: Set-up assist to: Obtain clothing/put away FIM - Lower Body Dressing/Undressing Lower body dressing/undressing steps patient completed: Thread/unthread right pants leg;Thread/unthread left pants leg;Don/Doff right sock;Don/Doff left sock Lower body dressing/undressing: 3: Mod-Patient completed 50-74% of tasks  FIM - Hotel manager Devices: Grab bar or rail for support Toileting: 1: Total-Patient completed zero steps, helper did all 3  FIM - Diplomatic Services operational officer Devices: Bedside commode;Elevated toilet seat;Grab bars Toilet Transfers: 0-Activity did not occur  FIM - Banker Devices: Arm rests;Orthosis Bed/Chair Transfer: 5: Supine > Sit: Supervision (verbal cues/safety issues);4: Bed > Chair or W/C: Min A (steadying Pt. > 75%);4: Chair or W/C > Bed: Min  A (steadying Pt. > 75%)  FIM - Locomotion: Wheelchair Locomotion: Wheelchair: 2: Travels 50 - 149 ft with supervision, cueing or coaxing FIM - Locomotion: Ambulation Locomotion: Ambulation Assistive Devices: Walker - Rolling;Orthosis Ambulation/Gait Assistance: 2: Max assist (with RW; very ataxic) Locomotion: Ambulation: 1: Two helpers  Comprehension Comprehension Mode: Auditory Comprehension: 5-Follows basic conversation/direction: With no assist  Expression Expression Mode: Verbal Expression: 5-Expresses basic needs/ideas: With extra time/assistive device  Social Interaction Social Interaction: 7-Interacts appropriately with others - No medications needed.  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  1. DVT Prophylaxis/Anticoagulation: Pharmaceutical: Lovenox  2. Chronic pain/Pain Management: Has been on steroids X 1 year for pain management--willing to attempt taper during hospitalization. Continue neurontin for neuropathy. Prn oxcodone for pain effective.  3. Anxiety disorder/ Mood: Provide ego support as has lot of anxiety about current condition. Continue celexa. LCSW to follow for evaluation.  4. Neuropsych: This patient is capable of making decisions on his own behalf.  5. HTN: monitor with bid checks. Continue Cardura.  6. Neurogenic B/B: Augment bowel program with suppository daily. Also has h/o BPH--Check PVRs. Change flomax to HS. 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

## 2013-06-26 NOTE — Progress Notes (Signed)
No void in 7 hours, I & O cath=900! Will pass on to check scans every 4 hours. Patient reports drinking "a lot of water"  At bedtime and during night. Encouraged to limit PO intake. Alfredo Martinez A

## 2013-06-27 ENCOUNTER — Inpatient Hospital Stay (HOSPITAL_COMMUNITY): Payer: Medicare Other | Admitting: Occupational Therapy

## 2013-06-27 ENCOUNTER — Inpatient Hospital Stay (HOSPITAL_COMMUNITY): Payer: Medicare Other

## 2013-06-27 DIAGNOSIS — G834 Cauda equina syndrome: Secondary | ICD-10-CM

## 2013-06-27 NOTE — Progress Notes (Signed)
[facility] Individual Statement of Services  Patient Name:  [name]  Date:  06/27/2013  Welcome to the [facility].  Our goal is to provide you with an individualized program based on your diagnosis and situation, designed to meet your specific needs.  With this comprehensive rehabilitation program, you will be expected to participate in at least 3 hours of rehabilitation therapies Monday-Friday, with modified therapy programming on the weekends.  Your rehabilitation program will include the following services:  Physical Therapy (PT), Occupational Therapy (OT), 24 hour per day rehabilitation nursing, Therapeutic Recreaction (TR), Case Management (Social Worker), Rehabilitation Medicine, Nutrition Services and Pharmacy Services  Weekly team conferences will be held on Tuesdays to discuss your progress.  Your Social Worker will talk with you frequently to get your input and to update you on team discussions.  Team conferences with you and your family in attendance may also be held.  Expected length of stay: 2 weeks  Overall anticipated outcome: supervision to min assist  Depending on your progress and recovery, your program may change. Your Social Worker will coordinate services and will keep you informed of any changes. Your Social Worker's name and contact numbers are listed  below.  The following services may also be recommended but are not provided by the [facility]:   Driving Evaluations  Home Health Rehabiltiation Services  Outpatient Rehabilitation Services    Arrangements will be made to provide these services after discharge if needed.  Arrangements include referral to agencies that provide these services.  Your insurance has been verified to be:  Medicare and [employer] Your primary doctor is:  [name]  Pertinent information will be shared with your doctor and your insurance company.  Social  Worker:  [employer], [place] [phone] or [phone]   Information discussed with and copy given to patient by: Amada Jupiter, 06/27/2013, 3:41 PM

## 2013-06-27 NOTE — Progress Notes (Signed)
Subjective/Complaints: Frustrated that bowel and bladder aren't moving. Otherwise is seeing some progress. Review of Systems - Negative except Weakness in legs, numbness left foot, problems with voiding Objective: Vital Signs: Blood pressure 145/74, pulse 63, temperature 97.9 F (36.6 C), temperature source Oral, resp. rate 19, height 6' (1.829 m), weight 83.8 kg (184 lb 11.9 oz), SpO2 98.00%. No results found. No results found for this or any previous visit (from the past 72 hour(s)).    Nursing note and vitals reviewed.  Constitutional: [name] is oriented to person, place, and time. [name] appears well-developed and well-nourished.  HENT:  Head: Normocephalic and atraumatic.  Mouth/Throat: Oropharynx is clear and moist.  Wears upper dentures and partial on lower  Eyes: Conjunctivae are normal. Pupils are equal, round, and reactive to light.   Cardiovascular: Normal rate and regular rhythm.  Respiratory: Effort normal and breath sounds normal. No respiratory distress. [name] has no wheezes.  Skin: Midline lumbar incision small amount of serous drainage on 4 x 4, no bleeding, no wound dehiscence GI: Soft. Bowel sounds are normal. [name] exhibits no distension. There is no tenderness.  Large midline incision with large incision hernia   well healed   Musculoskeletal: [name] exhibits no edema and no tenderness.  Neurological: [name] is alert and oriented to person, place, and time.  Speech clear. Follows commands without difficulty.   Decreased S1 and L5 dermatomes sensationLeft and right. Motor strength is 3 minus right deltoid for at left deltoid 4/5 bilateral bicep triceps and grip 4- hip flexors,4- bilateral knee extensors and right ankle dorsiflexor and plantar flexor 0/5 left ankle dorsiflexor and plantar flexor. Right ankle 1-2/5.  Skin: Skin is warm and dry.    Assessment/Plan: 1. Functional deficits secondary to cauda equina syndrome which require 3+ hours per day of interdisciplinary therapy in a  comprehensive inpatient rehab setting. Physiatrist is providing close team supervision and 24 hour management of active medical problems listed below. Physiatrist and rehab team continue to assess barriers to discharge/monitor patient progress toward functional and medical goals.  Provided education regarding bowel and bladder function and the potential for recovery over the next weeks ahead. His bladder could prove to be a major issue given his history of BPH  FIM: FIM - Bathing Bathing Steps Patient Completed: Chest;Right Arm;Left Arm;Abdomen;Right upper leg;Left upper leg;Right lower leg (including foot);Left lower leg (including foot) Bathing: 3: Mod-Patient completes 5-7 92f 10 parts or 50-74%  FIM - Upper Body Dressing/Undressing Upper body dressing/undressing steps patient completed: Thread/unthread right sleeve of pullover shirt/dresss;Thread/unthread left sleeve of pullover shirt/dress;Put head through opening of pull over shirt/dress;Pull shirt over trunk Upper body dressing/undressing: 5: Set-up assist to: Obtain clothing/put away FIM - Lower Body Dressing/Undressing Lower body dressing/undressing steps patient completed: Thread/unthread right pants leg;Thread/unthread left pants leg;Don/Doff right sock;Don/Doff left sock Lower body dressing/undressing: 3: Mod-Patient completed 50-74% of tasks  FIM - Hotel manager Devices: Grab bar or rail for support Toileting: 1: Total-Patient completed zero steps, helper did all 3  FIM - Diplomatic Services operational officer Devices: Bedside commode;Elevated toilet seat;Grab bars Toilet Transfers: 0-Activity did not occur  FIM - Banker Devices: Arm rests;Orthosis Bed/Chair Transfer: 5: Supine > Sit: Supervision (verbal cues/safety issues);4: Bed > Chair or W/C: Min A (steadying Pt. > 75%);4: Chair or W/C > Bed: Min A (steadying Pt. > 75%)  FIM - Locomotion:  Wheelchair Locomotion: Wheelchair: 2: Travels 50 - 149 ft with supervision, cueing or coaxing FIM - Locomotion: Ambulation Locomotion:  Ambulation Assistive Devices: Walker - Rolling;Orthosis Ambulation/Gait Assistance: 2: Max assist (with RW; very ataxic) Locomotion: Ambulation: 1: Two helpers  Comprehension Comprehension Mode: Auditory Comprehension: 5-Follows basic conversation/direction: With no assist  Expression Expression Mode: Verbal Expression: 5-Expresses basic needs/ideas: With extra time/assistive device  Social Interaction Social Interaction: 7-Interacts appropriately with others - No medications needed.  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  1. DVT Prophylaxis/Anticoagulation: Pharmaceutical: Lovenox  2. Chronic pain/Pain Management: Has been on steroids X 1 year for pain management--willing to attempt taper during hospitalization. Continue neurontin for neuropathy. Prn oxcodone for pain effective.  3. Anxiety disorder/ Mood: Provide ego support as has lot of anxiety about current condition. Continue celexa. LCSW to follow for evaluation.  4. Neuropsych: This patient is capable of making decisions on his own behalf.  5. HTN: monitor with bid checks. Continue Cardura.  6. Neurogenic B/B:   bowel program with suppository daily. doxasosin hs. urechoine 50mg  tid.  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

## 2013-06-27 NOTE — Progress Notes (Signed)
Physical Therapy Session Note  Patient Details  Name: [name] MRN: [id number] Date of Birth: [birth year]  Today's Date: 06/27/2013 Time: 1330-1411 Time Calculation (min): 41 min  Short Term Goals: Week 1:  PT Short Term Goal 1 (Week 1): Pt will be able to complete squat pivot transfer with S PT Short Term Goal 2 (Week 1): Pt will be able to complete stand pivot transfer with min A PT Short Term Goal 3 (Week 1): Pt will gait x 20' with min A PT Short Term Goal 4 (Week 1): Pt will propel w/c on unit > 150' mod I  Skilled Therapeutic Interventions/Progress Updates:    S squat pivot transfer OOB to w/c with pt setting up w/c appropriately without cues. Focused on neuro re-ed in parallel bars for weightbearing/weight shifting, gait (forwards and retro gait with focus on step length and maintaining feet apart from red midline during steps), toe taps to work on coordination, and sit to stand technique. Pt with incontinent BM; returned to room to complete toilet transfers and balance for clothing management in standing; min to mod A overall for transfer and balance.   Therapy Documentation Precautions:  Precautions Precautions: Back;Fall Precaution Comments: Reviewed back precautions with patient, patient able to verbalize 2/3 independently Restrictions Weight Bearing Restrictions: No  Pain: Denies pain.  See FIM for current functional status  Therapy/Group: Individual Therapy  Karolee Stamps [facility] 06/27/2013, 2:14 PM

## 2013-06-27 NOTE — Progress Notes (Signed)
Patient ID: [name], male   DOB: [birth year], 72 y.o.   MRN: [id number] Stable.on pt at present

## 2013-06-27 NOTE — Progress Notes (Signed)
Occupational Therapy Session Note  Patient Details  Name: [name] MRN: [id number] Date of Birth: 09-25-1940  Today's Date: 06/27/2013 Time: 1030-1100 Time Calculation (min): 30 min  Short Term Goals: Week 1:  OT Short Term Goal 1 (Week 1): Patient will perform LB dressing with maximal assistance (X1 person) OT Short Term Goal 2 (Week 1): Patient will complete toilet transfer (stand pivot method using RW) with moderate assistance OT Short Term Goal 3 (Week 1): Shower stall transfers will be discussed with patient regarding best transfer method for walk-in shower OT Short Term Goal 4 (Week 1): Patient will be educated on BUE strengthening exercises for a HEP  Skilled Therapeutic Interventions: Therapeutic exercises with emphasis on BUE HEP using weighted bar (5-10 lbs), improved sit>stand, and safety awareness.   Patient performed 3 sets of upper body exercises with facilitation (hand guidance) to execute movements properly.       Therapy Documentation Precautions:  Precautions Precautions: Back;Fall Precaution Comments: Reviewed back precautions with patient, patient able to verbalize 2/3 independently Restrictions Weight Bearing Restrictions: No Pain: Pain Assessment Pain Assessment: 0-10 Pain Score: 6  Pain Type: Surgical pain Pain Location: Back Pain Orientation: Mid;Lower Pain Descriptors / Indicators: Aching Pain Frequency: Intermittent Pain Onset: On-going Patients Stated Pain Goal: 3 Pain Intervention(s): Medication (See eMAR)  Exercises: General Exercises - Upper Extremity Elbow Flexion: Right;Left;Strengthening;10 reps;Seated;Bar weights/barbell Elbow Extension: Both;10 reps;Strengthening;Seated;Bar weights/barbell General Exercises - Lower Extremity Repetitive Sit to Stands: Two upper extremities;Other (comment)   Therapy/Group: Individual Therapy  Velera Lansdale 06/27/2013, 12:15 PM

## 2013-06-27 NOTE — Progress Notes (Signed)
Dulcolax supp. Given this am, large amount of hard stool felt in rectum. Reports up to BR past 2 mornings without results. Performed dig stim with large hard results. Patient tearful with current situation. [name] A

## 2013-06-27 NOTE — Progress Notes (Signed)
Occupational Therapy Session Note  Patient Details  Name: [name] MRN: [id number] Date of Birth: 12/24/1940  Today's Date: 06/27/2013 Time: 1191-4782 Time Calculation (min): 55 min  Short Term Goals: Week 1:  OT Short Term Goal 1 (Week 1): Patient will perform LB dressing with maximal assistance (X1 person) OT Short Term Goal 2 (Week 1): Patient will complete toilet transfer (stand pivot method using RW) with moderate assistance OT Short Term Goal 3 (Week 1): Shower stall transfers will be discussed with patient regarding best transfer method for walk-in shower OT Short Term Goal 4 (Week 1): Patient will be educated on BUE strengthening exercises for a HEP  Skilled Therapeutic Interventions/Progress Updates:  Upon entering room, patient found seated in recliner fully dressed except pants were not pulled up all the way. Patient stated he bathed and dressed himself this am. Therapist encouraged patient to wait to complete these task secondary to back precautions and for safety reasons; patient stated he understood. Patient stood with minimal assistance and therapist assisted patient with donning pants >waist. From here, patient propelled self -> therapy gym. Patient transferred onto NuStep machine at supervision level, setting up w/c appropriately and correctly for transfer. Patient then engaged in 12 minutes of exercises on level 5. Patient then transferred back to w/c and propelled towards ADL apartment. Therapist donned 4lb weight >right ankle and 1.5lb weight >left ankle.Therapist set up blue simulated shower block with shower seat. Patient stood with RW, ambulated to blue simulated shower block and secondary to increased anxiety and decreased hip strength/support was unable to perform transfer over shower block. Patient with ataxia during gait. Needed +2 assist to get chair while patient standing. From here, patient sat in w/c.Patient stood using rail on wall and attempted marching in  place, this task was difficult for patient. Patient then sat in w/c and performed hip flexion exercises. Therapist encouraged and recommended patient perform these exercises during downtime. Patient then worked on pulling self with hamstrings in w/c towards therapy gym. At end of session, left patient seated in w/c in gym for next therapy session.    Precautions:  Precautions Precautions: Back;Fall Precaution Comments: Reviewed back precautions with patient, patient able to verbalize 2/3 independently Restrictions Weight Bearing Restrictions: No  See FIM for current functional status  Therapy/Group: Individual Therapy  Nekeisha Aure 06/27/2013, 9:33 AM

## 2013-06-27 NOTE — Progress Notes (Signed)
Physical Therapy Session Note  Patient Details  Name: [name] MRN: [id number] Date of Birth: August 02, 1940  Today's Date: 06/27/2013 Time: 0930-1025 Time Calculation (min): 55 min  Short Term Goals: Week 1:  PT Short Term Goal 1 (Week 1): Pt will be able to complete squat pivot transfer with S PT Short Term Goal 2 (Week 1): Pt will be able to complete stand pivot transfer with min A PT Short Term Goal 3 (Week 1): Pt will gait x 20' with min A PT Short Term Goal 4 (Week 1): Pt will propel w/c on unit > 150' mod I  Skilled Therapeutic Interventions/Progress Updates:    Session focused on therapeutic activities to work on sit to stands, transfers, weightbearing to work on proprioceptive input to LE's (with 3-4# ankle weights) and dynamic standing balance at table top while working on pipe tree with UE's. Pt required min to mod A with sit to stands and balance; multiple rest breaks needed due to fatigue. Pt with incontinent BM and required A back to room and for toileting (mod A for transfer and total A to manage clothing and brief. Pt able to perform hygiene independently). W/c propulsion on unit for endurance and strengthening. Demonstrated seated LE therex (heel/toe raises, LAQ and marches) for pt as HEP for in the room when up in w/c. Left in gym for next therapist.   Therapy Documentation Precautions:  Precautions Precautions: Back;Fall Precaution Comments: Reviewed back precautions with patient, patient able to verbalize 2/3 independently Restrictions Weight Bearing Restrictions: No   Pain: C/o back pain - RN notified for pain medication.  See FIM for current functional status  Therapy/Group: Individual Therapy  Karolee Stamps [facility] 06/27/2013, 11:44 AM

## 2013-06-27 NOTE — Progress Notes (Signed)
Patient information reviewed and entered into eRehab system by Talon Witting, RN, CRRN, PPS Coordinator.  Information including medical coding and functional independence measure will be reviewed and updated through discharge.     Per nursing patient was given "Data Collection Information Summary for Patients in Inpatient Rehabilitation Facilities with attached "Privacy Act Statement-Health Care Records" upon admission.  

## 2013-06-27 NOTE — Progress Notes (Signed)
At 1910, no void, I & O cath=663ml. At 0030, no void, I & O cath=872ml. Alfredo Martinez A

## 2013-06-28 ENCOUNTER — Inpatient Hospital Stay (HOSPITAL_COMMUNITY): Payer: Medicare Other

## 2013-06-28 ENCOUNTER — Inpatient Hospital Stay (HOSPITAL_COMMUNITY): Payer: Medicare Other | Admitting: *Deleted

## 2013-06-28 ENCOUNTER — Inpatient Hospital Stay (HOSPITAL_COMMUNITY): Payer: Medicare Other | Admitting: Occupational Therapy

## 2013-06-28 ENCOUNTER — Encounter (HOSPITAL_COMMUNITY): Payer: Medicare Other | Admitting: Occupational Therapy

## 2013-06-28 DIAGNOSIS — G834 Cauda equina syndrome: Secondary | ICD-10-CM

## 2013-06-28 MED ORDER — BETHANECHOL CHLORIDE 25 MG PO TABS
50.0000 mg | ORAL_TABLET | Freq: Four times a day (QID) | ORAL | Status: DC
  Administered 2013-06-28 – 2013-07-08 (×39): 50 mg via ORAL
  Filled 2013-06-28 (×46): qty 2

## 2013-06-28 NOTE — Progress Notes (Signed)
Dulcolax supp. Given this am, small amount of stool felt in rectum. Up to BR with small amount of results. Performed dig stim with minimal results. Patient still tearful of current situation.

## 2013-06-28 NOTE — Patient Care Conference (Signed)
Inpatient RehabilitationTeam Conference and Plan of Care Update Date: 06/28/2013   Time: 2:05 PM    Patient Name: [name]      Medical Record Number: [id number]  Date of Birth: [birth year] Sex: Male         Room/Bed: 4W08C/4W08C-01 Payor Info: Payor: MEDICARE / Plan: MEDICARE PART A AND B / Product Type: *No Product type* /    Admitting Diagnosis: cauda equina syndrome  Admit Date/Time:  06/22/2013  4:52 PM Admission Comments: No comment available   Primary Diagnosis:  <principal problem not specified> Principal Problem: <principal problem not specified>  Patient Active Problem List   Diagnosis Date Noted  . Cauda equina syndrome 06/22/2013  . Osteonecrosis 05/05/2013  . Small bowel obstruction 04/17/2013  . Acute renal failure 04/17/2013  . Medial meniscus, posterior horn derangement 02/17/2013  . Knee sprain and strain 02/17/2013  . Trigger point with neck pain 03/18/2012  . Rotator cuff tear arthropathy of right shoulder 09/08/2011  . CAD in native artery 01/09/2011  . Ankle fracture 12/25/2010  . Contusion of shoulder, left 12/25/2010  . PEMPHIGUS VULGARIS 07/02/2010  . MRSA 05/13/2010  . PRURITUS 05/13/2010  . SPINAL STENOSIS, LUMBAR 05/13/2010  . HYPERLIPIDEMIA 04/24/2010  . GASTROESOPHAGEAL REFLUX DISEASE 04/24/2010  . VENTRAL HERNIA, INCISIONAL 04/24/2010  . DEGENERATIVE JOINT DISEASE 04/24/2010  . PYOGENIC ARTHRITIS, SHOULDER REGION 02/04/2010  . HYPERTENSION 01/01/2010  . RUPTURE ROTATOR CUFF 02/19/2009    Expected Discharge Date: Expected Discharge Date: 07/08/13  Team Members Present: Physician leading conference: Dr. 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 pt education  see above, back precautions   Bowel/Bladder   Bowel program qAM and I&O cath q6-8hrs for volumes >350cc  Mod A with bowel and bladder  Continent of bowel and bladder   Swallow/Nutrition/ Hydration             ADL's   supervision > min assist with w/c level and static standing tasks, +2 for functional gait with RW  overall mod I from w/c level; lateral leans for LB ADLs  ADL retraining, functional transfers, functional mobility, BUE/BLE strengthening, sit<>stands, dynamic standing, coordination with active movements   Mobility   steady A squat pivot transfers; mod to max A stand pivot or gait with RW (+2 for safety)  mod I w/c level; min A short distance gait goals and for 1 step into house  neuro re-ed for balance, proprioceptive input with LE's, gait and coordination; functional strengthening and endurance, transfers   Communication             Safety/Cognition/ Behavioral Observations            Pain   5-10mg  Oxycodone q4hr PRN for pain  3 or less on a pain scale of 0-10  Assess pain qshift and reassess after every pain intervention   Skin   Surgical skin incision covered with gauze dressing, 2 small skin tears to Rt. buttocks covered with foam dressing  No new skin breakdown  Monitor and assess q shift for skin breakdown    Rehab Goals Patient on target to meet rehab goals: Yes *See Care Plan and progress notes for  long and short-term goals.  Barriers to Discharge: persistent neurological deficits, a bit impulsive    Possible Resolutions to Barriers:  pt education, adaptive equipment    Discharge Planning/Teaching Needs:  home with wife who can provide light physical assistance      Team Discussion:  B/B issues ongoing.  Need to completely stop bedpan use and have pt up for all voiding.  Starting to feel some fullness/ gas.  Very motivated with goals at mod i overall and short  distance gait.    Revisions to Treatment Plan:  None   Continued Need for Acute Rehabilitation Level of Care: The patient requires daily medical management by a physician with specialized training in physical medicine and rehabilitation for the following conditions: Daily direction of a multidisciplinary physical rehabilitation program to ensure safe treatment while eliciting the highest outcome that is of practical value to the patient.: Yes Daily medical management of patient stability for increased activity during participation in an intensive rehabilitation regime.: Yes Daily analysis of laboratory values and/or radiology reports with any subsequent need for medication adjustment of medical intervention for : Other;Neurological problems;Post surgical problems  Lorelei Heikkila 06/28/2013, 7:42 PM

## 2013-06-28 NOTE — Progress Notes (Signed)
Subjective/Complaints: No new issues. Anxious to go home but knows he needs to put the work in. Review of Systems - Negative except Weakness in legs, numbness left foot, problems with voiding Objective: Vital Signs: Blood pressure 142/68, pulse 65, temperature 97.8 F (36.6 C), temperature source Oral, resp. rate 16, height 6' (1.829 m), weight 83.8 kg (184 lb 11.9 oz), SpO2 96.00%. No results found. No results found for this or any previous visit (from the past 72 hour(s)).    Nursing note and vitals reviewed.  Constitutional: He is oriented to person, place, and time. He appears well-developed and well-nourished.  HENT:  Head: Normocephalic and atraumatic.  Mouth/Throat: Oropharynx is clear and moist.  Eyes: Conjunctivae are normal. Pupils are equal, round, and reactive to light.   Cardiovascular: Normal rate and regular rhythm.  Respiratory: Effort normal and breath sounds normal. No respiratory distress. He has no wheezes.  Skin: Midline lumbar incision small amount of serous drainage on 4 x 4, no bleeding, no wound dehiscence GI: Soft. Bowel sounds are normal. He exhibits no distension. There is no tenderness.  Large midline incision with large incision hernia   well healed   Musculoskeletal: He exhibits no edema and no tenderness.  Neurological: He is alert and oriented to person, place, and time.  Speech clear. Follows commands without difficulty.   Decreased S1 and L5 dermatomes sensationLeft and right. Motor strength is 3 minus right deltoid for at left deltoid 4/5 bilateral bicep triceps and grip 4- hip flexors,4- bilateral knee extensors and right ankle dorsiflexor and plantar flexor 0/5 left ankle dorsiflexor and plantar flexor. Right ankle 1 + to 2/5.  Skin: Skin is warm and dry.    Assessment/Plan: 1. Functional deficits secondary to cauda equina syndrome which require 3+ hours per day of interdisciplinary therapy in a comprehensive inpatient rehab  setting. Physiatrist is providing close team supervision and 24 hour management of active medical problems listed below. Physiatrist and rehab team continue to assess barriers to discharge/monitor patient progress toward functional and medical goals.    FIM: FIM - Bathing Bathing Steps Patient Completed: Chest;Right Arm;Left Arm;Abdomen;Right upper leg;Left upper leg;Right lower leg (including foot);Left lower leg (including foot) Bathing: 3: Mod-Patient completes 5-7 59f 10 parts or 50-74%  FIM - Upper Body Dressing/Undressing Upper body dressing/undressing steps patient completed: Thread/unthread right sleeve of pullover shirt/dresss;Thread/unthread left sleeve of pullover shirt/dress;Put head through opening of pull over shirt/dress;Pull shirt over trunk Upper body dressing/undressing: 5: Set-up assist to: Obtain clothing/put away FIM - Lower Body Dressing/Undressing Lower body dressing/undressing steps patient completed: Thread/unthread right pants leg;Thread/unthread left pants leg;Don/Doff right sock;Don/Doff left sock Lower body dressing/undressing: 3: Mod-Patient completed 50-74% of tasks  FIM - Hotel manager Devices: Grab bar or rail for support Toileting: 1: Total-Patient completed zero steps, helper did all 3  FIM - Diplomatic Services operational officer Devices: Grab bars Toilet Transfers: 3-From toilet/BSC: Mod A (lift or lower assist);3-To toilet/BSC: Mod A (lift or lower assist)  FIM - Press photographer Assistive Devices: Arm rests;Orthosis Bed/Chair Transfer: 5: Supine > Sit: Supervision (verbal cues/safety issues);4: Bed > Chair or W/C: Min A (steadying Pt. > 75%);4: Chair or W/C > Bed: Min A (steadying Pt. > 75%)  FIM - Locomotion: Wheelchair Locomotion: Wheelchair: 5: Travels 150 ft or more: maneuvers on rugs and over door sills with supervision, cueing or coaxing FIM - Locomotion: Ambulation Locomotion: Ambulation Assistive  Devices: Orthosis;Parallel bars Ambulation/Gait Assistance: 4: Min assist Locomotion: Ambulation: 1: Travels less than  50 ft with minimal assistance (Pt.>75%)  Comprehension Comprehension Mode: Auditory Comprehension: 6-Follows complex conversation/direction: With extra time/assistive device  Expression Expression Mode: Verbal Expression: 6-Expresses complex ideas: With extra time/assistive device  Social Interaction Social Interaction: 7-Interacts appropriately with others - No medications needed.  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  1. DVT Prophylaxis/Anticoagulation: Pharmaceutical: Lovenox  2. Chronic pain/Pain Management: Has been on steroids X 1 year for pain management--willing to attempt taper during hospitalization. Continue neurontin for neuropathy. Prn oxcodone for pain effective.  3. Anxiety disorder/ Mood: Provide ego support as has lot of anxiety about current condition. Continue celexa. LCSW to follow for evaluation.  4. Neuropsych: This patient is capable of making decisions on his own behalf.  5. HTN: monitor with bid checks. Continue Cardura.  6. Neurogenic B/B:   bowel program with suppository daily. doxasosin hs. urechoine 50mg  tid--increase to qid  -up to EOB to void or to toilet, double voids, massage 7. DJD/DDD: On chronic steroids for skin condition and pain--      LOS (Days) 6 A FACE TO FACE EVALUATION WAS PERFORMED  [name] T 06/28/2013, 7:30 AM

## 2013-06-28 NOTE — Progress Notes (Signed)
Social Work Patient ID: [name], male   DOB: 04/29/1941, 72 y.o.   MRN: [id number]  Met with pt following team conference.  Aware and agreeable with targeted d/c date of 12/12, however, concerned he may have not reached his own personal goals of recovery by that date.  Providing emotional support as he remains slightly tearful about his fear of being a "burden" to family.  Need to monitor emotional adjustment as pt is still very motivated, however, feel he could quickly declined emotionally if slow progress with B/B continues.    Latoyia Tecson, LCSW

## 2013-06-28 NOTE — Progress Notes (Signed)
Recreational Therapy Assessment and Plan  Patient Details  Name: [name] MRN: [id number] Date of Birth: [birth year] Today's Date: 06/28/2013  Rehab Potential: Good ELOS: 10 days  Assessment Clinical Impression: Problem List:  Patient Active Problem List    Diagnosis  Date Noted   .  Cauda equina syndrome  06/22/2013   .  Osteonecrosis  05/05/2013   .  Small bowel obstruction  04/17/2013   .  Acute renal failure  04/17/2013   .  Medial meniscus, posterior horn derangement  02/17/2013   .  Knee sprain and strain  02/17/2013   .  Trigger point with neck pain  03/18/2012   .  Rotator cuff tear arthropathy of right shoulder  09/08/2011   .  CAD in native artery  01/09/2011   .  Ankle fracture  12/25/2010   .  Contusion of shoulder, left  12/25/2010   .  PEMPHIGUS VULGARIS  07/02/2010   .  MRSA  05/13/2010   .  PRURITUS  05/13/2010   .  SPINAL STENOSIS, LUMBAR  05/13/2010   .  HYPERLIPIDEMIA  04/24/2010   .  GASTROESOPHAGEAL REFLUX DISEASE  04/24/2010   .  VENTRAL HERNIA, INCISIONAL  04/24/2010   .  DEGENERATIVE JOINT DISEASE  04/24/2010   .  PYOGENIC ARTHRITIS, SHOULDER REGION  02/04/2010   .  HYPERTENSION  01/01/2010   .  RUPTURE ROTATOR CUFF  02/19/2009    Past Medical History:  Past Medical History   Diagnosis  Date   .  Hyperlipidemia    .  Small bowel problem      HAD ALOT OF SCAR TISSUE FROM PREVIOUS SURGERIES..NG WAS INSERTED ...Marland KitchenMarland KitchenNO SURGERY NEEDED...Marland KitchenMarland KitchenIN FOR 8 DAYS   .  Disorder of blood      BEEN TREATED BY DERMATOLOGIST X 4 YRS..."BLOOD BLISTERS"   .  Arthritis    .  Gout    .  Anxiety    .  Hx MRSA infection      rt shoulder   .  Pneumonia      "I've had it 3-4 times"   .  Coronary artery disease      IN 2000 STENT PLACED IN 2012 sees Dr. Dietrich Pates, saw last 2013   .  HTN (hypertension)      sees [name] in [place]   .  Small bowel obstruction    .  Ventral hernia    .  Acute renal failure  04/17/2013   .  AVN (avascular necrosis of bone)       bilateral hips    Past Surgical History:  Past Surgical History   Procedure  Laterality  Date   .  Sternal surg   2000     HAS HAD 5-6 ON HIS STERNUM; "caught MRSA in it"   .  Lumbar laminectomy/decompression microdiscectomy   06/13/2011     Procedure: LUMBAR LAMINECTOMY/DECOMPRESSION MICRODISCECTOMY; Surgeon: Karn Cassis; Location: [facility]; Service: Neurosurgery; Laterality: N/A; Lumbar three, lumbar four-five Laminectomy   .  Eye surgery       bilateral cataract   .  Anterior cervical corpectomy   12/17/11   .  Incision and drainage of wound   ~ 20ll; 12/03/11     "had infection in my right"   .  Shoulder open rotator cuff repair   ~ 2011     right   .  Peripherally inserted central catheter insertion   2011 & 11/2011   .  Cataract extraction w/ intraocular lens implant, bilateral   ? 2011   .  Coronary artery bypass graft   2000     CABG X5   .  Back surgery       lumbar   .  Tonsillectomy   1949   .  Cholecystectomy   2006 "or after"   .  Coronary angioplasty with stent placement   2012   .  Anterior cervical corpectomy   12/17/2011     Procedure: ANTERIOR CERVICAL CORPECTOMY; Surgeon: Karn Cassis, MD; Location: [facility]; Service: Neurosurgery; Laterality: N/A; Anterior Cervical Decompression Fusion Five to Thoracic Two with plating   .  Lumbar laminectomy/decompression microdiscectomy  Right  06/17/2013     Procedure: LUMBAR LAMINECTOMY/DECOMPRESSION MICRODISCECTOMY 1 LEVEL; Surgeon: Karn Cassis, MD; Location: [facility]; Service: Neurosurgery; Laterality: Right; Right L3-4 Microdiskectomy    Assessment & Plan  Clinical Impression: Patient is a 72 y.o. year old male with recent admission to the hospital on with history of CAD, HTN, SBO, Lumbar decompression 2/14 with left foot drop, RLE pain with radiation to the knee and falls due to severe weakness of ileo-psoas as well as quadriceps muscle. Patient also with history of RTC limiting RUE as well as  partial resection of sternum due mediastinitis. 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 Jeral Fruit. Post op on with headaches and on bedrest for minimal CSF leak till 06/20/13. RLE weakness decreasing with decrease in back pain. Therapies initiated and patient with poor posture with inability to activate BLE due to sensory deficits as well as incontinent of bladder and bowel (lack of sensation reported). Foley removed today. Patient transferred to CIR on 06/22/2013.   Pt presents with decreased activity tolerance, decreased functional mobility, decreased balance, ataxia, Limiting pt's independence with leisure/community pursuits.  Leisure History/Participation Premorbid leisure interest/current participation: Ashby Dawes - Oceanographer - Grocery store;Community - Press photographer - Travel (Comment);Sports - Golf Other Leisure Interests: Television;Reading Leisure Participation Style: With Family/Friends Awareness of Community Resources: Excellent Psychosocial / Spiritual Social interaction - Mood/Behavior: Cooperative Firefighter Appropriate for Education?: Yes Recreational Therapy Orientation Orientation -Reviewed with patient: Available activity resources Strengths/Weaknesses Patient Strengths/Abilities: Willingness to participate;Active premorbidly Patient weaknesses: Physical limitations TR Patient demonstrates impairments in the following area(s): Endurance  Plan Rec Therapy Plan Is patient appropriate for Therapeutic Recreation?: Yes Rehab Potential: Good Treatment times per week: Min 1 time per week >20 minutes TR Treatment/Interventions: Adaptive equipment instruction;1:1 session;Balance/vestibular training;Community reintegration;Functional mobility training;Patient/family education;Recreation/leisure participation;Therapeutic activities;Therapeutic  exercise  Recommendations for other services: None  Discharge Criteria: Patient will be discharged from TR if patient refuses treatment 3 consecutive times without medical reason.  If treatment goals not met, if there is a change in medical status, if patient makes no progress towards goals or if patient is discharged from hospital.  The above assessment, treatment plan, treatment alternatives and goals were discussed and mutually agreed upon: by patient  Lynne Righi 06/28/2013, 4:01 PM

## 2013-06-28 NOTE — Progress Notes (Signed)
Social Work  Social Work Assessment and Plan  Patient Details  Name: [name] MRN: [id number] Date of Birth: 10-15-1940  Today's Date: 06/27/2013  Problem List:  Patient Active Problem List   Diagnosis Date Noted  . Cauda equina syndrome 06/22/2013  . Osteonecrosis 05/05/2013  . Small bowel obstruction 04/17/2013  . Acute renal failure 04/17/2013  . Medial meniscus, posterior horn derangement 02/17/2013  . Knee sprain and strain 02/17/2013  . Trigger point with neck pain 03/18/2012  . Rotator cuff tear arthropathy of right shoulder 09/08/2011  . CAD in native artery 01/09/2011  . Ankle fracture 12/25/2010  . Contusion of shoulder, left 12/25/2010  . PEMPHIGUS VULGARIS 07/02/2010  . MRSA 05/13/2010  . PRURITUS 05/13/2010  . SPINAL STENOSIS, LUMBAR 05/13/2010  . HYPERLIPIDEMIA 04/24/2010  . GASTROESOPHAGEAL REFLUX DISEASE 04/24/2010  . VENTRAL HERNIA, INCISIONAL 04/24/2010  . DEGENERATIVE JOINT DISEASE 04/24/2010  . PYOGENIC ARTHRITIS, SHOULDER REGION 02/04/2010  . HYPERTENSION 01/01/2010  . RUPTURE ROTATOR CUFF 02/19/2009   Past Medical History:  Past Medical History  Diagnosis Date  . Hyperlipidemia   . Small bowel problem     HAD ALOT OF SCAR TISSUE FROM PREVIOUS SURGERIES..NG WAS INSERTED ...Marland KitchenMarland KitchenNO SURGERY NEEDED...Marland KitchenMarland KitchenIN FOR 8 DAYS  . Disorder of blood     BEEN TREATED BY DERMATOLOGIST X 4 YRS..."BLOOD BLISTERS"  . Arthritis   . Gout   . Anxiety   . Hx MRSA infection     rt shoulder  . Pneumonia     "I've had it 3-4 times"  . Coronary artery disease     IN 2000   STENT PLACED IN 2012 sees Dr. Dietrich Pates, saw last 2013  . HTN (hypertension)     sees [name] in [place]  . Small bowel obstruction   . Ventral hernia   . Acute renal failure 04/17/2013  . AVN (avascular necrosis of bone)     bilateral hips   Past Surgical History:  Past Surgical History  Procedure Laterality Date  . Sternal surg  2000    HAS HAD 5-6 ON HIS STERNUM; "caught MRSA in  it"  . Lumbar laminectomy/decompression microdiscectomy  06/13/2011    Procedure: LUMBAR LAMINECTOMY/DECOMPRESSION MICRODISCECTOMY;  Surgeon: Karn Cassis;  Location: [facility];  Service: Neurosurgery;  Laterality: N/A;  Lumbar three, lumbar four-five Laminectomy  . Eye surgery      bilateral cataract  . Anterior cervical corpectomy  12/17/11  . Incision and drainage of wound  ~ 20ll; 12/03/11    "had infection in my right"  . Shoulder open rotator cuff repair  ~ 2011    right  . Peripherally inserted central catheter insertion  2011 & 11/2011  . Cataract extraction w/ intraocular lens  implant, bilateral  ? 2011  . Coronary artery bypass graft  2000    CABG X5  . Back surgery      lumbar  . Tonsillectomy  1949  . Cholecystectomy  2006 "or after"  . Coronary angioplasty with stent placement  2012  . Anterior cervical corpectomy  12/17/2011    Procedure: ANTERIOR CERVICAL CORPECTOMY;  Surgeon: Karn Cassis, MD;  Location: [facility];  Service: Neurosurgery;  Laterality: N/A;  Anterior Cervical Decompression Fusion Five to Thoracic Two with plating  . Lumbar laminectomy/decompression microdiscectomy Right 06/17/2013    Procedure: LUMBAR LAMINECTOMY/DECOMPRESSION MICRODISCECTOMY 1 LEVEL;  Surgeon: Karn Cassis, MD;  Location: [facility];  Service: Neurosurgery;  Laterality: Right;  Right L3-4 Microdiskectomy   Social  History:  reports that he quit smoking about 14 years ago. His smoking use included Cigarettes. He has a 1 pack-year smoking history. He has never used smokeless tobacco. He reports that he does not drink alcohol or use illicit drugs.  Family / Support Systems Marital Status: Married Patient Roles: Spouse;Parent Spouse/Significant Other: wife, Herald Vallin @ [phone] or Harlow Ohms Children: son, Deniece Portela @ [phone];  son, Trey Paula and daughter, Elita Quick -- all local and working f/t. Son, Deniece Portela, a little more available Other Supports: church support Anticipated  Caregiver: wife and family Ability/Limitations of Caregiver: no limitations Caregiver Availability: 24/7 Family Dynamics: pt describes very good support from wife and children. No concerns.  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  Occasionally tearful when he worries about "being a burden on my family."  He admits to "... having a pity party every now and then...but the Shaune Pollack will take care of me."  Denies any significant s/s of depression or anxiety - feels his faith is "holding me up" Recent Psychosocial Issues: None Pyschiatric History: None Substance Abuse History: None  Patient / Family Perceptions, Expectations & Goals Pt/Family understanding of illness & functional limitations: pt and family with good understanding of surgery performed and current, resulting functional deficits Premorbid pt/family roles/activities: Pt was completely independent PTA and very active.  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 Anticipated changes in roles/activities/participation: pt's return to independence TBD - this will affect return to his part time work and whether wife will need to assume caregiver responsibilities. Pt/family expectations/goals: "I just pray that I can do things for myself.  Get control over  my bowel."  [employer]: None Premorbid Home Care/DME Agencies: None Transportation available at discharge: yes Resource referrals recommended: Psychology  Discharge Planning Living Arrangements: Spouse/significant other Support Systems: Spouse/significant other;Children;Church/faith community;Friends/neighbors Type of Residence: Private residence Insurance Resources: Administrator (specify) ([employer]) Financial Resources: Social Doctor, hospital Screen Referred: No Living Expenses: Own Money Management: Patient Does the patient have any problems obtaining your medications?: No Home Management: pt and wife Patient/Family Preliminary Plans: pt fully plans to return home with his wife who can provide physical assistance Social Work Anticipated Follow Up Needs: HH/OP Expected length of stay: ELOS 10 to 12 days  Clinical Impression Very pleasant gentleman here following back surgery.  Admittedly very concerned about his current functional limitations and fears of not having good return.  Relying on family and faith to cope and deal with these concerns/ fears.  Good family support.  Very motivated and appreciative of all help.  Gurjit Loconte 06/27/2013, 4:00 PM

## 2013-06-28 NOTE — Progress Notes (Signed)
Occupational Therapy Session Note  Patient Details  Name: [name] MRN: [id number] Date of Birth: 03-25-1941  Today's Date: 06/28/2013 Time: 3086-5784 Time Calculation (min): 45 min  Short Term Goals: Week 1:  OT Short Term Goal 1 (Week 1): Patient will perform LB dressing with maximal assistance (X1 person) OT Short Term Goal 2 (Week 1): Patient will complete toilet transfer (stand pivot method using RW) with moderate assistance OT Short Term Goal 3 (Week 1): Shower stall transfers will be discussed with patient regarding best transfer method for walk-in shower OT Short Term Goal 4 (Week 1): Patient will be educated on BUE strengthening exercises for a HEP  Skilled Therapeutic Interventions: Therapeutic exercises with emphasis on improved endurance and increased strength at bil UE and LE.   Patient completed NuStep with 1 rest break: HR 72 bpm, bp 142/60.   Patient reports motivation to increase endurance and strength in order to "go home" with good participation during treatment.   Patient progressed to upper body strengthening and reports plan to resume his gym membership to continue exercises, post d/c.     Therapy Documentation Precautions:  Precautions Precautions: Back;Fall Precaution Comments: Reviewed back precautions with patient, patient able to verbalize 2/3 independently Restrictions Weight Bearing Restrictions: No  Pain: Pain Assessment Pain Assessment: 0-10 Pain Score: 8  Pain Type: Acute pain;Surgical pain Pain Location: Back Pain Orientation: Lower Pain Descriptors / Indicators: Aching;Tender;Sore Pain Frequency: Occasional Pain Onset: Gradual Patients Stated Pain Goal: 3 Pain Intervention(s): Medication (See eMAR) (oxoycodone 10 mgpo)  Exercises: Cardiovascular Exercises NuStep: 10 min, level 5 General Exercises - Upper Extremity Elbow Flexion: Both;10 reps;Strengthening;Seated Elbow Extension: Right;Left;Strengthening;Seated;10 reps  See FIM for  current functional status  Therapy/Group: Individual Therapy   Elysha Daw 06/28/2013, 11:15 AM

## 2013-06-28 NOTE — Progress Notes (Signed)
Patient ID: [name], male   DOB: Jun 15, 1941, 72 y.o.   MRN: [id number] Stable. No pain. To get a bone density test

## 2013-06-28 NOTE — Progress Notes (Signed)
Social Work Patient ID: [name], male   DOB: 03/18/1941, 72 y.o.   MRN: [id number]   Amada Jupiter, LCSW Social Worker Signed  Patient Care Conference Service date: 06/28/2013 7:42 PM  Inpatient RehabilitationTeam Conference and Plan of Care Update Date: 06/28/2013   Time: 2:05 PM     Patient Name: [name]       Medical Record Number: [id number]   Date of Birth: 06-08-1941 Sex: Male         Room/Bed: 4W08C/4W08C-01 Payor Info: Payor: MEDICARE / Plan: MEDICARE PART A AND B / Product Type: *No Product type* /   Admitting Diagnosis: cauda equina syndrome   Admit Date/Time:  06/22/2013  4:52 PM Admission Comments: No comment available   Primary Diagnosis:  <principal problem not specified> Principal Problem: <principal problem not specified>    Patient Active Problem List     Diagnosis  Date Noted   .  Cauda equina syndrome  06/22/2013   .  Osteonecrosis  05/05/2013   .  Small bowel obstruction  04/17/2013   .  Acute renal failure  04/17/2013   .  Medial meniscus, posterior horn derangement  02/17/2013   .  Knee sprain and strain  02/17/2013   .  Trigger point with neck pain  03/18/2012   .  Rotator cuff tear arthropathy of right shoulder  09/08/2011   .  CAD in native artery  01/09/2011   .  Ankle fracture  12/25/2010   .  Contusion of shoulder, left  12/25/2010   .  PEMPHIGUS VULGARIS  07/02/2010   .  MRSA  05/13/2010   .  PRURITUS  05/13/2010   .  SPINAL STENOSIS, LUMBAR  05/13/2010   .  HYPERLIPIDEMIA  04/24/2010   .  GASTROESOPHAGEAL REFLUX DISEASE  04/24/2010   .  VENTRAL HERNIA, INCISIONAL  04/24/2010   .  DEGENERATIVE JOINT DISEASE  04/24/2010   .  PYOGENIC ARTHRITIS, SHOULDER REGION  02/04/2010   .  HYPERTENSION  01/01/2010   .  RUPTURE ROTATOR CUFF  02/19/2009     Expected Discharge Date: Expected Discharge Date: 07/08/13  Team Members Present: Physician leading conference: Dr. Faith Rogue Social Worker Present: Amada Jupiter, LCSW Nurse Present:  Carmie End, RN PT Present: Karolee Stamps, Jerrye Bushy, PT OT Present: Edwin Cap, Loistine Chance, OT;Frank [place], OT PPS Coordinator present : Tora Duck, RN, CRRN;Becky Henrene Dodge, PT        Current Status/Progress  Goal  Weekly Team Focus   Medical     cauda equina syndrome with neurogenic bowel and bladder  bowel and bladder training. pt education  see above, back precautions   Bowel/Bladder     Bowel program qAM and I&O cath q6-8hrs for volumes >350cc  Mod A with bowel and bladder  Continent of bowel and bladder   Swallow/Nutrition/ Hydration            ADL's     supervision > min assist with w/c level and static standing tasks, +2 for functional gait with RW  overall mod I from w/c level; lateral leans for LB ADLs  ADL retraining, functional transfers, functional mobility, BUE/BLE strengthening, sit<>stands, dynamic standing, coordination with active movements   Mobility     steady A squat pivot transfers; mod to max A stand pivot or gait with RW (+2 for safety)  mod I w/c level; min A short distance gait goals and for 1 step into house  neuro re-ed for balance, proprioceptive input  with LE's, gait and coordination; functional strengthening and endurance, transfers   Communication            Safety/Cognition/ Behavioral Observations           Pain     5-10mg  Oxycodone q4hr PRN for pain  3 or less on a pain scale of 0-10  Assess pain qshift and reassess after every pain intervention   Skin     Surgical skin incision covered with gauze dressing, 2 small skin tears to Rt. buttocks covered with foam dressing  No new skin breakdown  Monitor and assess q shift for skin breakdown    Rehab Goals Patient on target to meet rehab goals: Yes *See Care Plan and progress notes for long and short-term goals.    Barriers to Discharge:  persistent neurological deficits, a bit impulsive      Possible Resolutions to Barriers:    pt education, adaptive equipment      Discharge  Planning/Teaching Needs:    home with wife who can provide light physical assistance      Team Discussion:    B/B issues ongoing.  Need to completely stop bedpan use and have pt up for all voiding.  Starting to feel some fullness/ gas.  Very motivated with goals at mod i overall and short distance gait.     Revisions to Treatment Plan:    None    Continued Need for Acute Rehabilitation Level of Care: The patient requires daily medical management by a physician with specialized training in physical medicine and rehabilitation for the following conditions: Daily direction of a multidisciplinary physical rehabilitation program to ensure safe treatment while eliciting the highest outcome that is of practical value to the patient.: Yes Daily medical management of patient stability for increased activity during participation in an intensive rehabilitation regime.: Yes Daily analysis of laboratory values and/or radiology reports with any subsequent need for medication adjustment of medical intervention for : Other;Neurological problems;Post surgical problems  Ebany Bowermaster 06/28/2013, 7:42 PM

## 2013-06-28 NOTE — Progress Notes (Signed)
Patient had dressing to left elbow with new serosanguinous drainage present.  Dressing changed with sterile 2x2 gauze and medical tape reinforcement.

## 2013-06-28 NOTE — Progress Notes (Signed)
Occupational Therapy Session Notes  Patient Details  Name: [name] MRN: [id number] Date of Birth: 1941/02/05  Today's Date: 06/28/2013  Short Term Goals: Week 1:  OT Short Term Goal 1 (Week 1): Patient will perform LB dressing with maximal assistance (X1 person) OT Short Term Goal 2 (Week 1): Patient will complete toilet transfer (stand pivot method using RW) with moderate assistance OT Short Term Goal 3 (Week 1): Shower stall transfers will be discussed with patient regarding best transfer method for walk-in shower OT Short Term Goal 4 (Week 1): Patient will be educated on BUE strengthening exercises for a HEP  Skilled Therapeutic Interventions/Progress Updates:   Session #1 [phone] - 55 Minutes Individual Therapy No complaints of pain Patient found seated in w/c awaiting therapists arival. Patient propelled self into bathroom and transferred onto shower seat in walk-in shower. From here, patient completed UB/LB bathing in seated position. Patient with ataxic movements throughout UE and trunk during bathing tasks. Patient transferred out of shower into w/c and propelled self back into room for UB/LB dressing. Patient completed UB dressing with supervision and only required assistance with brief and sit<>stand for LB dressing. Patient is very motivated and donned bilateral socks as well as bilateral shoes with AFO on left foot. From here, patient propelled self > therapy gym using BLEs (pulling motion, working on hamstrings). Patient sat in w/c and engaged in exercise to BUEs using ergometer machine on random level 10 for ~10 minutes. Therapist assisted patient back to room and notified RN of needed dressing changes. All needed items left within reach.   Session #2 1430-1500 - 30 Minutes Individual Therapy  No complaints of pain Patient found seated in w/c. Patient propelled self > therapy gym. Focused skilled intervention on transfer, sit<>stands, dynamic standing  balance/tolerance/endurance, BUE strengthening exercises, functional ambulation with RW, and overall activity tolerance/endurance. Patient propelled self > room and left patient seated in w/c with all needed items within reach.   Precautions:  Precautions Precautions: Back;Fall Precaution Comments: Reviewed back precautions with patient, patient able to verbalize 2/3 independently Restrictions Weight Bearing Restrictions: No  See FIM for current functional status  [name] 06/28/2013, 7:20 AM

## 2013-06-28 NOTE — Progress Notes (Signed)
Physical Therapy Session Note  Patient Details  Name: [name] MRN: [id number] Date of Birth: 1941/05/16  Today's Date: 06/28/2013 Time: 1300-1355 Time Calculation (min): 55 min  Short Term Goals: Week 1:  PT Short Term Goal 1 (Week 1): Pt will be able to complete squat pivot transfer with S PT Short Term Goal 2 (Week 1): Pt will be able to complete stand pivot transfer with min A PT Short Term Goal 3 (Week 1): Pt will gait x 20' with min A PT Short Term Goal 4 (Week 1): Pt will propel w/c on unit > 150' mod I  Skilled Therapeutic Interventions/Progress Updates:   Session focused on functional furniture transfers and neuro-ed to BLE for coordination/balance/motor control during standing activity, sit to stands with emphasis on using LE's vs UE's, weightshifting, toe taps, and gait training. With RW pt requires mod to max A for gait x 12' and in parallel bars min A due to compensation using UE's for support; continues to be very ataxic and with scissoring gait pattern. Furniture transfers with min A from couch with armrests and without armrests. Pt self propelled w/c on unit for general endurance and strengthening.  Therapy Documentation Precautions:  Precautions Precautions: Back;Fall Precaution Comments: Reviewed back precautions with patient, patient able to verbalize 2/3 independently Restrictions Weight Bearing Restrictions: No  Pain: No complaints.   See FIM for current functional status  Therapy/Group: Individual Therapy  Karolee Stamps [facility] 06/28/2013, 1:58 PM

## 2013-06-29 ENCOUNTER — Inpatient Hospital Stay (HOSPITAL_COMMUNITY): Payer: Medicare Other

## 2013-06-29 ENCOUNTER — Inpatient Hospital Stay (HOSPITAL_COMMUNITY): Payer: Medicare Other | Admitting: Occupational Therapy

## 2013-06-29 ENCOUNTER — Ambulatory Visit (HOSPITAL_COMMUNITY): Payer: Medicare Other | Admitting: *Deleted

## 2013-06-29 LAB — URINALYSIS, ROUTINE W REFLEX MICROSCOPIC
Bilirubin Urine: NEGATIVE
Glucose, UA: NEGATIVE mg/dL
Hgb urine dipstick: NEGATIVE
Ketones, ur: NEGATIVE mg/dL
Nitrite: NEGATIVE
Protein, ur: NEGATIVE mg/dL
Specific Gravity, Urine: 1.006 (ref 1.005–1.030)
pH: 6 (ref 5.0–8.0)

## 2013-06-29 LAB — CREATININE, SERUM
Creatinine, Ser: 1.24 mg/dL (ref 0.50–1.35)
GFR calc Af Amer: 66 mL/min — ABNORMAL LOW (ref >90)

## 2013-06-29 NOTE — Progress Notes (Signed)
Patient reported while getting back in bed with min assist from NT he felt pain in his left chest area. Patient given oxycodone 10mg  po for pain and verbalized he would notify RN if medication did not assist with management of pain . Continue with plan of care .                     Cleotilde Neer

## 2013-06-29 NOTE — Progress Notes (Signed)
Physical Therapy Session Note  Patient Details  Name: [name] MRN: [id number] Date of Birth: Nov 09, 1940  Today's Date: 06/29/2013 Time: 9629-5284 Time Calculation (min): 55 min  Short Term Goals: Week 1:  PT Short Term Goal 1 (Week 1): Pt will be able to complete squat pivot transfer with S PT Short Term Goal 2 (Week 1): Pt will be able to complete stand pivot transfer with min A PT Short Term Goal 3 (Week 1): Pt will gait x 20' with min A PT Short Term Goal 4 (Week 1): Pt will propel w/c on unit > 150' mod I  Skilled Therapeutic Interventions/Progress Updates:    Session focused on neuro re-ed for trunk control, dynamic standing balance, and coordination of LE's with gait training. Used ankle weights (4# on LLE and 2.5# on RLE) as well as weighted belt to help with proprioceptive input to trunk and BLE during standing activities. Pt requires overall mod A for balance and gait. Propelled w/c back to room with LE's for functional strengthening.  Therapy Documentation Precautions:  Precautions Precautions: Back;Fall Precaution Comments: Reviewed back precautions with patient, patient able to verbalize 2/3 independently Restrictions Weight Bearing Restrictions: No   Pain: Premedicated. Denies current pain.  See FIM for current functional status  Therapy/Group: Individual Therapy and Co-Treatment with Rec. Therapy  Karolee Stamps [facility] 06/29/2013, 11:13 AM

## 2013-06-29 NOTE — Progress Notes (Signed)
Subjective/Complaints: Still having issues trying to empty. Feels urge to go but cannot empty. Review of Systems - Negative except Weakness in legs, numbness left foot, problems with voiding Objective: Vital Signs: Blood pressure 163/83, pulse 55, temperature 98 F (36.7 C), temperature source Oral, resp. rate 20, height 6' (1.829 m), weight 83.8 kg (184 lb 11.9 oz), SpO2 99.00%. No results found. Results for orders placed during the hospital encounter of 06/22/13 (from the past 72 hour(s))  URINALYSIS, ROUTINE W REFLEX MICROSCOPIC     Status: None   Collection Time    06/29/13  4:07 AM      Result Value Range   Color, Urine YELLOW  YELLOW   APPearance CLEAR  CLEAR   Specific Gravity, Urine 1.006  1.005 - 1.030   pH 6.0  5.0 - 8.0   Glucose, UA NEGATIVE  NEGATIVE mg/dL   Hgb urine dipstick NEGATIVE  NEGATIVE   Bilirubin Urine NEGATIVE  NEGATIVE   Ketones, ur NEGATIVE  NEGATIVE mg/dL   Protein, ur NEGATIVE  NEGATIVE mg/dL   Urobilinogen, UA 0.2  0.0 - 1.0 mg/dL   Nitrite NEGATIVE  NEGATIVE   Leukocytes, UA NEGATIVE  NEGATIVE   Comment: MICROSCOPIC NOT DONE ON URINES WITH NEGATIVE PROTEIN, BLOOD, LEUKOCYTES, NITRITE, OR GLUCOSE <1000 mg/dL.  CREATININE, SERUM     Status: Abnormal   Collection Time    06/29/13  7:08 AM      Result Value Range   Creatinine, Ser 1.24  0.50 - 1.35 mg/dL   GFR calc non Af Amer 57 (*) >90 mL/min   GFR calc Af Amer 66 (*) >90 mL/min   Comment: (NOTE)     The eGFR has been calculated using the CKD EPI equation.     This calculation has not been validated in all clinical situations.     eGFR's persistently <90 mL/min signify possible Chronic Kidney     Disease.      Nursing note and vitals reviewed.  Constitutional: He is oriented to person, place, and time. He appears well-developed and well-nourished.  HENT:  Head: Normocephalic and atraumatic.  Mouth/Throat: Oropharynx is clear and moist.  Eyes: Conjunctivae are normal. Pupils are equal, round,  and reactive to light.   Cardiovascular: Normal rate and regular rhythm.  Respiratory: Effort normal and breath sounds normal. No respiratory distress. He has no wheezes.  Skin: Midline lumbar incision small amount of serous drainage on 4 x 4, no bleeding, no wound dehiscence GI: Soft. Bowel sounds are normal. He exhibits no distension. There is no tenderness.  Large midline incision with large incision hernia   well healed   Musculoskeletal: He exhibits no edema and no tenderness.  Neurological: He is alert and oriented to person, place, and time.  Speech clear. Follows commands without difficulty.   Decreased S1 and L5 dermatomes sensationLeft and right. Motor strength is 3 minus right deltoid for at left deltoid 4/5 bilateral bicep triceps and grip 4- hip flexors,4- bilateral knee extensors and right ankle dorsiflexor and plantar flexor 0/5 left ankle dorsiflexor and plantar flexor. Right ankle 1 + to 2/5.  Skin: Skin is warm and dry.    Assessment/Plan: 1. Functional deficits secondary to cauda equina syndrome which require 3+ hours per day of interdisciplinary therapy in a comprehensive inpatient rehab setting. Physiatrist is providing close team supervision and 24 hour management of active medical problems listed below. Physiatrist and rehab team continue to assess barriers to discharge/monitor patient progress toward functional and medical goals.  FIM: FIM - Bathing Bathing Steps Patient Completed: Chest;Right Arm;Left Arm;Abdomen;Right upper leg;Left upper leg;Right lower leg (including foot);Left lower leg (including foot);Front perineal area;Buttocks Bathing: 4: Steadying assist  FIM - Upper Body Dressing/Undressing Upper body dressing/undressing steps patient completed: Thread/unthread right sleeve of pullover shirt/dresss;Thread/unthread left sleeve of pullover shirt/dress;Put head through opening of pull over shirt/dress;Pull shirt over trunk Upper body  dressing/undressing: 5: Set-up assist to: Obtain clothing/put away FIM - Lower Body Dressing/Undressing Lower body dressing/undressing steps patient completed: Thread/unthread right pants leg;Thread/unthread left pants leg;Don/Doff right sock;Don/Doff left sock;Pull pants up/down;Fasten/unfasten pants;Don/Doff right shoe;Don/Doff left shoe;Fasten/unfasten right shoe;Fasten/unfasten left shoe Lower body dressing/undressing: 4: Steadying Assist  FIM - Toileting Toileting Assistive Devices: Grab bar or rail for support Toileting: 1: Total-Patient completed zero steps, helper did all 3  FIM - Diplomatic Services operational officer Devices: Grab bars Toilet Transfers: 3-From toilet/BSC: Mod A (lift or lower assist);3-To toilet/BSC: Mod A (lift or lower assist)  FIM - Bed/Chair Transfer Bed/Chair Transfer Assistive Devices: Arm rests;Orthosis Bed/Chair Transfer: 5: Supine > Sit: Supervision (verbal cues/safety issues);4: Bed > Chair or W/C: Min A (steadying Pt. > 75%);4: Chair or W/C > Bed: Min A (steadying Pt. > 75%)  FIM - Locomotion: Wheelchair Locomotion: Wheelchair: 6: Travels 150 ft or more, turns around, maneuvers to table, bed or toilet, negotiates 3% grade: maneuvers on rugs and over door sills independently FIM - Locomotion: Ambulation Locomotion: Ambulation Assistive Devices: Walker - Rolling;Orthosis Ambulation/Gait Assistance: 3: Mod assist;2: Max assist Locomotion: Ambulation: 1: Travels less than 50 ft with maximal assistance (Pt: 25 - 49%)  Comprehension Comprehension Mode: Auditory Comprehension: 6-Follows complex conversation/direction: With extra time/assistive device  Expression Expression Mode: Verbal Expression: 6-Expresses complex ideas: With extra time/assistive device  Social Interaction Social Interaction: 7-Interacts appropriately with others - No medications needed.  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  1. DVT Prophylaxis/Anticoagulation: Pharmaceutical: Lovenox  2. Chronic pain/Pain Management: Has been on steroids X 1 year for pain management--willing to attempt taper during hospitalization. Continue neurontin for neuropathy. Prn oxcodone for pain effective.  3. Anxiety disorder/ Mood: Provide ego support as has lot of anxiety about current condition. Continue celexa. LCSW to follow for evaluation.  4. Neuropsych: This patient is capable of making decisions on his own behalf.  5. HTN: monitor with bid checks. Continue Cardura.  6. Neurogenic B/B:   bowel program with suppository daily. doxasosin hs. 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

## 2013-06-29 NOTE — Plan of Care (Signed)
Problem: SCI BLADDER ELIMINATION Goal: RH STG MANAGE BLADDER WITH EQUIPMENT WITH ASSISTANCE STG Manage Bladder With Equipment With Moderate Assistance  Outcome: Not Progressing Requires requires max assist

## 2013-06-29 NOTE — Plan of Care (Signed)
Problem: SCI BOWEL ELIMINATION Goal: RH STG MANAGE BOWEL W/EQUIPMENT W/ASSISTANCE STG Manage Bowel With Equipment With moderate Assistance  Outcome: Not Progressing Patient requires total assist with bowel program

## 2013-06-29 NOTE — Progress Notes (Signed)
Recreational Therapy Session Note  Patient Details  Name: [name] MRN: [id number] Date of Birth: [birth year] Today's Date: 06/29/2013  Pain: no c/o Skilled Therapeutic Interventions/Progress Updates: Session focused on activity tolerance, dynamic standing balance, awareness/safety, & short distance ambulation using RW.  Pt stood reaching outside BOS for horseshoe activity with min-mod assist with use of ankle weights and weighted belt on hips to assist with compensation due to ataxia.  Pt ambulated with RW with mod assist.  Therapy/Group: Co-Treatment [name] 06/29/2013, 10:18 AM

## 2013-06-29 NOTE — Progress Notes (Signed)
Patient ID: [name], male   DOB: 01/04/1941, 72 y.o.   MRN: [id number] Stable. Continue as per rehab.

## 2013-06-29 NOTE — Progress Notes (Signed)
Occupational Therapy Session Notes  Patient Details  Name: [name] MRN: [id number] Date of Birth: 1941-03-25  Today's Date: 06/29/2013  Short Term Goals: Week 1:  OT Short Term Goal 1 (Week 1): Patient will perform LB dressing with maximal assistance (X1 person) OT Short Term Goal 2 (Week 1): Patient will complete toilet transfer (stand pivot method using RW) with moderate assistance OT Short Term Goal 3 (Week 1): Shower stall transfers will be discussed with patient regarding best transfer method for walk-in shower OT Short Term Goal 4 (Week 1): Patient will be educated on BUE strengthening exercises for a HEP  Skilled Therapeutic Interventions/Progress Updates:   Session #1 [phone] - 55 Minutes Individual Therapy No complaints of pain Patient found seated in w/c waiting on OT's arrival. Patient gathered all necessary items for dressing prior to therapy session. Patient doffed bilateral socks and shoes, donned red hospital socks, then propelled self into bathroom. Patient transferred onto shower seat with close supervision (squat pivot technique). Patient completed UB/LB bathing in seated position with close supervision. After bathing, patient performed squat pivot transfer back to w/c. UB/LB dressing tasks completed in front of sink in sit<>stand position for brief and shorts. RN present for needed dressing change and medication management. Patient able to donn bilateral socks and shoes, including left AFO with set-up assistance. Therapist recommended reacher for picking up items off floor secondary to back precautions. Patient continues to require min verbal cues (supervision) for adhering to back precautions during functional tasks. After bathing & dressing, patient propelled self to therapy gym for therapeutic exercise using ergometer on random setting, level 10 for ~10 minutes. Skilled intervention with focus on ADL retraining, sit<>stands, functional transfers, w/c propulsion with  emphasis on BLE strengthening/coordination, overall activity tolerance/endurance, and BUE strengthening. At end of session, left patient seated in w/c in therapy gym waiting on next therapist.   Session #2 1345-1415 - 30 Minutes Individual Therapy No complaints of pain Patient found seated on elevated toilet seat in bathroom with PT present. Patient required setup assistance for peri cleaning and assistance with donning of new brief. Patient stood at sink in order to pull pants up to waist; mod assist to maintain dynamic standing balance. From here, patient propelled self > therapy gym. Patient engaged in BLE strengthening exercises using theraband and weighted ball. Patient completed 3 sets of 10 exercises, and therapist encouraged patient to do this exercises throughout the day.  After exercises, patient propelled self back to room and therapist left patient seated in w/c with all needed items within reach.   Precautions:  Precautions Precautions: Back;Fall Precaution Comments: Reviewed back precautions with patient, patient able to verbalize 2/3 independently Restrictions Weight Bearing Restrictions: No  See FIM for current functional status  Shaughn Thomley 06/29/2013, 7:19 AM

## 2013-06-29 NOTE — Progress Notes (Signed)
Physical Therapy Session Note  Patient Details  Name: [name] MRN: [id number] Date of Birth: Jul 27, 1941  Today's Date: 06/29/2013 Time: 7829-5621 Time Calculation (min): 45 min  Short Term Goals: Week 1:  PT Short Term Goal 1 (Week 1): Pt will be able to complete squat pivot transfer with S PT Short Term Goal 2 (Week 1): Pt will be able to complete stand pivot transfer with min A PT Short Term Goal 3 (Week 1): Pt will gait x 20' with min A PT Short Term Goal 4 (Week 1): Pt will propel w/c on unit > 150' mod I  Skilled Therapeutic Interventions/Progress Updates:   Pt with RN transferring to toilet to check if brief was soiled; cues for technique and safe foot placement during transfers. S squat pivot to/from Nustep and worked on level 5 x 10 min (one break after 6 min) for functional strengthening and endurance. Standing squats at parallel bars to focus on eccentric control and WB for proprioceptive input. Pt with need to have BM (just a little soiled in brief and then pt emptied on toilet) with min A for transfer to toilet. Left with OT for next session.   Therapy Documentation Precautions:  Precautions Precautions: Back;Fall Precaution Comments: Reviewed back precautions with patient, patient able to verbalize 2/3 independently Restrictions Weight Bearing Restrictions: No  Pain: Premedicated for pain - c/o knees being sore this PM.   See FIM for current functional status  Therapy/Group: Individual Therapy  Karolee Stamps [facility] 06/29/2013, 1:19 PM

## 2013-06-30 ENCOUNTER — Inpatient Hospital Stay (HOSPITAL_COMMUNITY): Payer: Medicare Other

## 2013-06-30 ENCOUNTER — Inpatient Hospital Stay (HOSPITAL_COMMUNITY): Payer: Medicare Other | Admitting: Occupational Therapy

## 2013-06-30 ENCOUNTER — Inpatient Hospital Stay (HOSPITAL_COMMUNITY): Payer: Medicare Other | Admitting: *Deleted

## 2013-06-30 LAB — BASIC METABOLIC PANEL
BUN: 24 mg/dL — ABNORMAL HIGH (ref 6–23)
CO2: 25 mEq/L (ref 19–32)
Chloride: 101 mEq/L (ref 96–112)
Creatinine, Ser: 1.24 mg/dL (ref 0.50–1.35)
GFR calc Af Amer: 66 mL/min — ABNORMAL LOW (ref >90)

## 2013-06-30 MED ORDER — DOXAZOSIN MESYLATE 8 MG PO TABS
12.0000 mg | ORAL_TABLET | Freq: Every day | ORAL | Status: DC
  Administered 2013-06-30 – 2013-07-01 (×2): 12 mg via ORAL
  Filled 2013-06-30 (×4): qty 1

## 2013-06-30 NOTE — Progress Notes (Signed)
Subjective/Complaints: Left pec pain after transferring yesterday--still a little sore today. Bladder not emptying yet Review of Systems -otherwise negative  Objective: Vital Signs: Blood pressure 158/64, pulse 64, temperature 98.1 F (36.7 C), temperature source Oral, resp. rate 17, height 6' (1.829 m), weight 82.555 kg (182 lb), SpO2 94.00%. No results found. Results for orders placed during the hospital encounter of 06/22/13 (from the past 72 hour(s))  URINALYSIS, ROUTINE W REFLEX MICROSCOPIC     Status: None   Collection Time    06/29/13  4:07 AM      Result Value Range   Color, Urine YELLOW  YELLOW   APPearance CLEAR  CLEAR   Specific Gravity, Urine 1.006  1.005 - 1.030   pH 6.0  5.0 - 8.0   Glucose, UA NEGATIVE  NEGATIVE mg/dL   Hgb urine dipstick NEGATIVE  NEGATIVE   Bilirubin Urine NEGATIVE  NEGATIVE   Ketones, ur NEGATIVE  NEGATIVE mg/dL   Protein, ur NEGATIVE  NEGATIVE mg/dL   Urobilinogen, UA 0.2  0.0 - 1.0 mg/dL   Nitrite NEGATIVE  NEGATIVE   Leukocytes, UA NEGATIVE  NEGATIVE   Comment: MICROSCOPIC NOT DONE ON URINES WITH NEGATIVE PROTEIN, BLOOD, LEUKOCYTES, NITRITE, OR GLUCOSE <1000 mg/dL.  CREATININE, SERUM     Status: Abnormal   Collection Time    06/29/13  7:08 AM      Result Value Range   Creatinine, Ser 1.24  0.50 - 1.35 mg/dL   GFR calc non Af Amer 57 (*) >90 mL/min   GFR calc Af Amer 66 (*) >90 mL/min   Comment: (NOTE)     The eGFR has been calculated using the CKD EPI equation.     This calculation has not been validated in all clinical situations.     eGFR's persistently <90 mL/min signify possible Chronic Kidney     Disease.      Nursing note and vitals reviewed.  Constitutional: He is oriented to person, place, and time. He appears well-developed and well-nourished.  HENT:  Head: Normocephalic and atraumatic.  Mouth/Throat: Oropharynx is clear and moist.  Eyes: Conjunctivae are normal. Pupils are equal, round, and reactive to light.    Cardiovascular: Normal rate and regular rhythm.  Respiratory: Effort normal and breath sounds normal. No respiratory distress. He has no wheezes.  Skin: Midline lumbar incision small amount of serous drainage on 4 x 4, no bleeding, no wound dehiscence GI: Soft. Bowel sounds are normal. He exhibits no distension. There is no tenderness.  Large midline incision with large incision hernia   well healed   Musculoskeletal: He exhibits no edema. Mild tenderness over left pecmajor Neurological: He is alert and oriented to person, place, and time.  Speech clear. Follows commands without difficulty.   Decreased S1 and L5 dermatomes sensationLeft and right. Motor strength is 3 minus right deltoid for at left deltoid 4/5 bilateral bicep triceps and grip 4- hip flexors,4- bilateral knee extensors and right ankle dorsiflexor and plantar flexor 0/5 left ankle dorsiflexor and plantar flexor. Right ankle 1 + to 2/5.  Skin: Skin is warm and dry.    Assessment/Plan: 1. Functional deficits secondary to cauda equina syndrome which require 3+ hours per day of interdisciplinary therapy in a comprehensive inpatient rehab setting. Physiatrist is providing close team supervision and 24 hour management of active medical problems listed below. Physiatrist and rehab team continue to assess barriers to discharge/monitor patient progress toward functional and medical goals.    FIM: FIM - Bathing Bathing Steps Patient  Completed: Chest;Right Arm;Left Arm;Abdomen;Right upper leg;Left upper leg;Right lower leg (including foot);Left lower leg (including foot);Front perineal area;Buttocks Bathing: 5: Supervision: Safety issues/verbal cues  FIM - Upper Body Dressing/Undressing Upper body dressing/undressing steps patient completed: Thread/unthread right sleeve of pullover shirt/dresss;Thread/unthread left sleeve of pullover shirt/dress;Put head through opening of pull over shirt/dress;Pull shirt over trunk Upper body  dressing/undressing: 5: Set-up assist to: Obtain clothing/put away FIM - Lower Body Dressing/Undressing Lower body dressing/undressing steps patient completed: Thread/unthread right pants leg;Thread/unthread left pants leg;Don/Doff right sock;Don/Doff left sock;Pull pants up/down;Fasten/unfasten pants;Don/Doff right shoe;Don/Doff left shoe;Fasten/unfasten right shoe;Fasten/unfasten left shoe Lower body dressing/undressing: 4: Steadying Assist (min/steady assist for sit<>stand)  FIM - Hotel manager Devices: Grab bar or rail for support Toileting: 1: Total-Patient completed zero steps, helper did all 3  FIM - Diplomatic Services operational officer Devices: Grab bars Toilet Transfers: 3-From toilet/BSC: Mod A (lift or lower assist);3-To toilet/BSC: Mod A (lift or lower assist)  FIM - Bed/Chair Transfer Bed/Chair Transfer Assistive Devices: Arm rests;Orthosis Bed/Chair Transfer: 5: Supine > Sit: Supervision (verbal cues/safety issues);4: Supine > Sit: Min A (steadying Pt. > 75%/lift 1 leg);4: Sit > Supine: Min A (steadying pt. > 75%/lift 1 leg);4: Bed > Chair or W/C: Min A (steadying Pt. > 75%);4: Chair or W/C > Bed: Min A (steadying Pt. > 75%)  FIM - Locomotion: Wheelchair Locomotion: Wheelchair: 6: Travels 150 ft or more, turns around, maneuvers to table, bed or toilet, negotiates 3% grade: maneuvers on rugs and over door sills independently FIM - Locomotion: Ambulation Locomotion: Ambulation Assistive Devices: Walker - Rolling;Orthosis Ambulation/Gait Assistance: 3: Mod assist Locomotion: Ambulation: 1: Travels less than 50 ft with moderate assistance (Pt: 50 - 74%)  Comprehension Comprehension Mode: Auditory Comprehension: 6-Follows complex conversation/direction: With extra time/assistive device  Expression Expression Mode: Verbal Expression: 6-Expresses complex ideas: With extra time/assistive device  Social Interaction Social Interaction: 7-Interacts  appropriately with others - No medications needed.  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  1. DVT Prophylaxis/Anticoagulation: Pharmaceutical: Lovenox  2. Chronic pain/Pain Management: Has been on steroids X 1 year for pain management--willing to attempt taper during hospitalization. Continue neurontin for neuropathy. Prn oxcodone for pain effective.  3. Anxiety disorder/ Mood: Provide ego support as has lot of anxiety about current condition. Continue celexa. LCSW to follow for evaluation.  4. Neuropsych: This patient is capable of making decisions on his own behalf.  5. HTN: monitor with bid checks. Continue Cardura.  6. Neurogenic B/B:   bowel program with suppository daily. doxasosin hs---increase to 12mg  as bp can tolerate. 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

## 2013-06-30 NOTE — Progress Notes (Signed)
Patient ID: [name], male   DOB: 10-31-1940, 72 y.o.   MRN: [id number] Lying flat with hypotensive episode. C/o burning in legs and scrotum. No feeling to urinate but able to have bm

## 2013-06-30 NOTE — Progress Notes (Signed)
Physical Therapy Session Note  Patient Details  Name: [name] MRN: [id number] Date of Birth: 01/05/1941  Today's Date: 06/30/2013 Time: 9528-4132 Time Calculation (min): 20 min  Short Term Goals: Week 1:  PT Short Term Goal 1 (Week 1): Pt will be able to complete squat pivot transfer with S PT Short Term Goal 2 (Week 1): Pt will be able to complete stand pivot transfer with min A PT Short Term Goal 3 (Week 1): Pt will gait x 20' with min A PT Short Term Goal 4 (Week 1): Pt will propel w/c on unit > 150' mod I  Skilled Therapeutic Interventions/Progress Updates:    Pt presents in supine c/o not feeling well this morning ("weak" and "swimmy headed"). BP taken in supine and then sat EOB x 5 min and took reading there. Pt's symptoms did not increase but were not resolving. Suggested supine or EOB therapy, but pt declined stating he just didn't feel like he could do anything right now. RN notified and already aware as these are new complaints by this patient. All needs in place.   Therapy Documentation Precautions:  Precautions Precautions: Back;Fall Precaution Comments: Reviewed back precautions with patient, patient able to verbalize 2/3 independently Restrictions Weight Bearing Restrictions: No General: Amount of Missed PT Time (min): 40 Minutes Vital Signs: BP: 99/43 mmHg Patient Position: Supine  BP: 121/61 mmHg Patient Position, if appropriate: Sitting Pain:  c/o discomfort on back on incision site and on L upper chest area where he states he pulled a muscle yesterday- RN already aware.  See FIM for current functional status  Therapy/Group: Individual Therapy  Karolee Stamps [facility] 06/30/2013, 9:10 AM

## 2013-06-30 NOTE — Plan of Care (Signed)
Problem: RH SKIN INTEGRITY Goal: RH STG ABLE TO PERFORM INCISION/WOUND CARE W/ASSISTANCE STG Able To Perform Incision/Wound Care With moderate Assistance.  Outcome: Not Progressing Patient unable to bend or twist to change back dressing

## 2013-06-30 NOTE — Progress Notes (Signed)
Occupational Therapy Weekly Progress Note & Session Note  Patient Details  Name: [name] MRN: [id number] Date of Birth: August 02, 1940  Today's Date: 06/30/2013  Patient has met 4 of 4 short term goals. Patient is making steady progress on CIR. Patient is very motivated to be as independent as possible. Patient with ataxia throughout UB and LB. Patient has difficult time with sit<>stand and dynamic standing tasks, including standing tolerance/endurance. LTGs are set for mod I at a w/c level. For now, have been focusing on standing for LB tasks to work on LB strength and endurance. Have discussed safety with patient regarding standing at home. Patient with decreased shoulder strength secondary to rotator cuff issues in the past.   Patient continues to demonstrate the following deficits: decreased independence with BADLs, decreased independence with functional transfers, decreased independence with functional ambulation, decreased independence with sit<>stands, decreased independence with dynamic standing balance/tolerance/endurance, decreased overall activity tolerance/endurance, ataxia throughout UB and LB. Therefore, patient will continue to benefit from skilled OT intervention to enhance overall performance with BADL, iADL and Reduce care partner burden.  Patient progressing toward long term goals. Upgraded patient to overall mod I from a w/c level (goals were supervision from standing level)  OT Short Term Goals Week 1:  OT Short Term Goal 1 (Week 1): Patient will perform LB dressing with maximal assistance (X1 person) OT Short Term Goal 1 - Progress (Week 1): Met OT Short Term Goal 2 (Week 1): Patient will complete toilet transfer (stand pivot method using RW) with moderate assistance OT Short Term Goal 2 - Progress (Week 1): Met OT Short Term Goal 3 (Week 1): Shower stall transfers will be discussed with patient regarding best transfer method for walk-in shower OT Short Term Goal 3 -  Progress (Week 1): Met OT Short Term Goal 4 (Week 1): Patient will be educated on BUE strengthening exercises for a HEP OT Short Term Goal 4 - Progress (Week 1): Met  Week 2:  OT Short Term Goal 1 (Week 2): STGs = LTGs  Skilled Therapeutic Interventions/Progress Updates:  Balance/vestibular training;Community reintegration;Discharge planning;DME/adaptive equipment instruction;Functional mobility training;Neuromuscular re-education;Pain management;Patient/family education;Psychosocial support;Self Care/advanced ADL retraining;Skin care/wound managment;Splinting/orthotics;Therapeutic Exercise;Therapeutic Activities;UE/LE Strength taining/ROM;Wheelchair propulsion/positioning;UE/LE Coordination activities   Precautions:  Precautions Precautions: Back;Fall Precaution Comments: Reviewed back precautions with patient, patient able to verbalize 2/3 independently Restrictions Weight Bearing Restrictions: No  Vital Signs: Therapy Vitals Pulse Rate: 74 BP: 121/61 mmHg Patient Position, if appropriate: Sitting  Pain: Pain Assessment Pain Assessment: 0-10 Pain Score: 2  Pain Type: Acute pain Pain Location: Back Pain Orientation: Lower;Mid Pain Descriptors / Indicators: Aching Pain Frequency: Occasional Pain Onset: Gradual Patients Stated Pain Goal: 2 Pain Intervention(s): Medication (See eMAR) (oxycodone 10 mgpo)  ADL - See FIM  See FIM for current functional status  -----------------------------------------------------------------------------------------------------------------------------  SESSION NOTE 1105-1145 - 40 Minutes Individual Therapy Patient with 5/10 pain in back; RN aware Patient found seated in w/c waiting on therapist. Patient propelled self > gym and BP checked =120/56. Patient then propelled self > ADL apartment for focus on sit<>stands, side stepping right<>left using counter as BUE support, dynamic standing balance/tolerance/endurance, and overall activity  tolerance/endurance. Patient sat in w/c and therapist discussed shower stall transfer with patient. Patient commented that his wife's shower was a tub/shower unit; therapist recommended he use this shower when at home. Patient then propelled self into bathroom and performed tub/shower transfer on/off tub transfer bench; supervision for transfer <>. Patient then propelled self > therapy gym and transferred onto NuStep  machine. Patient performed 11 minutes of exercise on level 5. At end of session, patient propelled self to family room.  [name] 06/30/2013, 11:34 AM

## 2013-06-30 NOTE — Progress Notes (Signed)
Occupational Therapy Session Note  Patient Details  Name: [name] MRN: [id number] Date of Birth: [birth year]  Today's Date: 06/30/2013 Time: 1000-1100 Time Calculation (min): 60 min  Short Term Goals: Week 2:  OT Short Term Goal 1 (Week 2): STGs = LTGs  Skilled Therapeutic Interventions/Progress Updates:  Pt resting in bed upon arrival.  Pt stated he "felt good" and was ready to take a shower.  BP in supine at 138/65.  Pt transferred to w/c and rolled into bathroom for shower requiring only assistance with positioning w/c in bathroom.  Pt utilized long handle sponge to assist with bathing lower legs.  Pt completed all bathing and dressing tasks requiring only steady A when standing.  Pt completed grooming tasks while standing at sink.  Pt required one (1) verbal cue to adhere to back precautions when removing pants in shower.  Pt transistioned to therapy gym and engaged in dynamic standing tasks without UE support.  Pt did lean against table for stability while using BUE for tasks. Focus on activity tolerance, standing balance, safety awareness, and transfers.  Therapy Documentation Precautions:  Precautions Precautions: Back;Fall Precaution Comments: Reviewed back precautions with patient, patient able to verbalize 2/3 independently Restrictions Weight Bearing Restrictions: No Pain: Pain Assessment Pain Assessment: No/denies pain   See FIM for current functional status  Therapy/Group: Individual Therapy  Rich Brave 06/30/2013, 12:04 PM

## 2013-06-30 NOTE — Progress Notes (Signed)
Physical Therapy Session Note  Patient Details  Name: [name] MRN: [id number] Date of Birth: Mar 18, 1941  Today's Date: 06/30/2013 Time: 5956-3875 Time Calculation (min): 30 min  Short Term Goals: Week 1:  PT Short Term Goal 1 (Week 1): Pt will be able to complete squat pivot transfer with S PT Short Term Goal 2 (Week 1): Pt will be able to complete stand pivot transfer with min A PT Short Term Goal 3 (Week 1): Pt will gait x 20' with min A PT Short Term Goal 4 (Week 1): Pt will propel w/c on unit > 150' mod I  Skilled Therapeutic Interventions/Progress Updates:    Session focused on dynamic standing balance activities with open and closed kinetic chain activities with overall min A, sit to stands with focus on technique and less dependence on UE's, and stand pivot transfer with min A. As fatigued, increased difficulty with coordination of LLE during mobility and cues needed to change position of foot to protect from injury. Squat pivot transfers to mat were mod I.   Therapy Documentation Precautions:  Precautions Precautions: Back;Fall Precaution Comments: Reviewed back precautions with patient, patient able to verbalize 2/3 independently Restrictions Weight Bearing Restrictions: No  Pain: Denies pain currently.  See FIM for current functional status  Therapy/Group: Individual Therapy  Karolee Stamps [facility] 06/30/2013, 4:17 PM

## 2013-07-01 ENCOUNTER — Inpatient Hospital Stay (HOSPITAL_COMMUNITY): Payer: Medicare Other

## 2013-07-01 ENCOUNTER — Inpatient Hospital Stay (HOSPITAL_COMMUNITY): Payer: Medicare Other | Admitting: Physical Therapy

## 2013-07-01 DIAGNOSIS — G834 Cauda equina syndrome: Secondary | ICD-10-CM

## 2013-07-01 DIAGNOSIS — N390 Urinary tract infection, site not specified: Secondary | ICD-10-CM

## 2013-07-01 LAB — URINE CULTURE: Colony Count: >=100000

## 2013-07-01 MED ORDER — AMOXICILLIN 250 MG PO CAPS
250.0000 mg | ORAL_CAPSULE | Freq: Three times a day (TID) | ORAL | Status: AC
  Administered 2013-07-01 – 2013-07-08 (×21): 250 mg via ORAL
  Filled 2013-07-01 (×21): qty 1

## 2013-07-01 NOTE — Plan of Care (Signed)
Problem: SCI BOWEL ELIMINATION Goal: RH STG MANAGE BOWEL W/EQUIPMENT W/ASSISTANCE STG Manage Bowel With Equipment With moderate Assistance  Outcome: Progressing Transferring to toilet for bowel program, per report

## 2013-07-01 NOTE — Progress Notes (Signed)
Subjective/Complaints: No new issues. Bladder and bowels still "stuck". Pain controlled Review of Systems -otherwise negative  Objective: Vital Signs: Blood pressure 145/71, pulse 79, temperature 98.4 F (36.9 C), temperature source Oral, resp. rate 17, height 6' (1.829 m), weight 82.555 kg (182 lb), SpO2 99.00%. No results found. Results for orders placed during the hospital encounter of 06/22/13 (from the past 72 hour(s))  URINALYSIS, ROUTINE W REFLEX MICROSCOPIC     Status: None   Collection Time    06/29/13  4:07 AM      Result Value Range   Color, Urine YELLOW  YELLOW   APPearance CLEAR  CLEAR   Specific Gravity, Urine 1.006  1.005 - 1.030   pH 6.0  5.0 - 8.0   Glucose, UA NEGATIVE  NEGATIVE mg/dL   Hgb urine dipstick NEGATIVE  NEGATIVE   Bilirubin Urine NEGATIVE  NEGATIVE   Ketones, ur NEGATIVE  NEGATIVE mg/dL   Protein, ur NEGATIVE  NEGATIVE mg/dL   Urobilinogen, UA 0.2  0.0 - 1.0 mg/dL   Nitrite NEGATIVE  NEGATIVE   Leukocytes, UA NEGATIVE  NEGATIVE   Comment: MICROSCOPIC NOT DONE ON URINES WITH NEGATIVE PROTEIN, BLOOD, LEUKOCYTES, NITRITE, OR GLUCOSE <1000 mg/dL.  URINE CULTURE     Status: None   Collection Time    06/29/13  4:07 AM      Result Value Range   Specimen Description URINE, CATHETERIZED     Special Requests NONE     Culture  Setup Time       Value: 06/29/2013 13:50     Performed at [facility] Count       Value: >=100,000 COLONIES/ML     Performed at [facility]   Culture       Value: ENTEROCOCCUS SPECIES     Performed at [facility]   Report Status 07/01/2013 FINAL     Organism ID, Bacteria ENTEROCOCCUS SPECIES    CREATININE, SERUM     Status: Abnormal   Collection Time    06/29/13  7:08 AM      Result Value Range   Creatinine, Ser 1.24  0.50 - 1.35 mg/dL   GFR calc non Af Amer 57 (*) >90 mL/min   GFR calc Af Amer 66 (*) >90 mL/min   Comment: (NOTE)     The eGFR has been calculated using the CKD EPI  equation.     This calculation has not been validated in all clinical situations.     eGFR's persistently <90 mL/min signify possible Chronic Kidney     Disease.  BASIC METABOLIC PANEL     Status: Abnormal   Collection Time    06/30/13 10:50 AM      Result Value Range   Sodium 138  135 - 145 mEq/L   Potassium 4.2  3.5 - 5.1 mEq/L   Chloride 101  96 - 112 mEq/L   CO2 25  19 - 32 mEq/L   Glucose, Bld 91  70 - 99 mg/dL   BUN 24 (*) 6 - 23 mg/dL   Creatinine, Ser 1.61  0.50 - 1.35 mg/dL   Calcium 9.0  8.4 - 09.6 mg/dL   GFR calc non Af Amer 57 (*) >90 mL/min   GFR calc Af Amer 66 (*) >90 mL/min   Comment: (NOTE)     The eGFR has been calculated using the CKD EPI equation.     This calculation has not been validated in all clinical situations.  eGFR's persistently <90 mL/min signify possible Chronic Kidney     Disease.      Nursing note and vitals reviewed.  Constitutional: He is oriented to person, place, and time. He appears well-developed and well-nourished.  HENT:  Head: Normocephalic and atraumatic.  Mouth/Throat: Oropharynx is clear and moist.  Eyes: Conjunctivae are normal. Pupils are equal, round, and reactive to light.   Cardiovascular: Normal rate and regular rhythm.  Respiratory: Effort normal and breath sounds normal. No respiratory distress. He has no wheezes.  Skin: Midline lumbar incision small amount of serous drainage on 4 x 4, no bleeding, no wound dehiscence GI: Soft. Bowel sounds are normal. He exhibits no distension. There is no tenderness.  Large midline incision with large incision hernia   well healed   Musculoskeletal: He exhibits no edema. Mild tenderness over left pecmajor Neurological: He is alert and oriented to person, place, and time.  Speech clear. Follows commands without difficulty.   Decreased S1 and L5 dermatomes sensationLeft and right. Motor strength is 3 minus right deltoid for at left deltoid 4/5 bilateral bicep triceps and grip 4- hip  flexors,4- bilateral knee extensors and right ankle dorsiflexor and plantar flexor 0/5 left ankle dorsiflexor and plantar flexor. Right ankle 1 + to 2/5.  Skin: Skin is warm and dry. Surgical site healed with scar tissue noted. Sutures in place   Assessment/Plan: 1. Functional deficits secondary to cauda equina syndrome which require 3+ hours per day of interdisciplinary therapy in a comprehensive inpatient rehab setting. Physiatrist is providing close team supervision and 24 hour management of active medical problems listed below. Physiatrist and rehab team continue to assess barriers to discharge/monitor patient progress toward functional and medical goals.    FIM: FIM - Bathing Bathing Steps Patient Completed: Chest;Right Arm;Left Arm;Abdomen;Right upper leg;Left upper leg;Right lower leg (including foot);Left lower leg (including foot);Front perineal area;Buttocks Bathing: 5: Supervision: Safety issues/verbal cues  FIM - Upper Body Dressing/Undressing Upper body dressing/undressing steps patient completed: Thread/unthread right sleeve of pullover shirt/dresss;Thread/unthread left sleeve of pullover shirt/dress;Put head through opening of pull over shirt/dress;Pull shirt over trunk Upper body dressing/undressing: 5: Set-up assist to: Obtain clothing/put away FIM - Lower Body Dressing/Undressing Lower body dressing/undressing steps patient completed: Thread/unthread right pants leg;Thread/unthread left pants leg;Don/Doff right sock;Don/Doff left sock;Pull pants up/down;Fasten/unfasten pants;Don/Doff right shoe;Don/Doff left shoe;Fasten/unfasten right shoe;Fasten/unfasten left shoe Lower body dressing/undressing: 4: Steadying Assist  FIM - Toileting Toileting steps completed by patient: Adjust clothing after toileting Toileting Assistive Devices: Grab bar or rail for support Toileting: 2: Max-Patient completed 1 of 3 steps  FIM - Diplomatic Services operational officer Devices: Grab  bars Toilet Transfers: 3-To toilet/BSC: Mod A (lift or lower assist);3-From toilet/BSC: Mod A (lift or lower assist)  FIM - Bed/Chair Transfer Bed/Chair Transfer Assistive Devices: Bed rails Bed/Chair Transfer: 5: Supine > Sit: Supervision (verbal cues/safety issues);4: Sit > Supine: Min A (steadying pt. > 75%/lift 1 leg);4: Bed > Chair or W/C: Min A (steadying Pt. > 75%);4: Chair or W/C > Bed: Min A (steadying Pt. > 75%)  FIM - Locomotion: Wheelchair Locomotion: Wheelchair: 6: Travels 150 ft or more, turns around, maneuvers to table, bed or toilet, negotiates 3% grade: maneuvers on rugs and over door sills independently FIM - Locomotion: Ambulation Locomotion: Ambulation Assistive Devices: Walker - Rolling;Orthosis Ambulation/Gait Assistance: 3: Mod assist Locomotion: Ambulation: 1: Travels less than 50 ft with moderate assistance (Pt: 50 - 74%)  Comprehension Comprehension Mode: Auditory Comprehension: 6-Follows complex conversation/direction: With extra time/assistive device  Expression  Expression Mode: Verbal Expression: 6-Expresses complex ideas: With extra time/assistive device  Social Interaction Social Interaction: 7-Interacts appropriately with others - No medications needed.  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  1. DVT Prophylaxis/Anticoagulation: Pharmaceutical: Lovenox  2. Chronic pain/Pain Management: Has been on steroids X 1 year for pain management--willing to attempt taper during hospitalization. Continue neurontin for neuropathy. Prn oxcodone for pain effective.  3. Anxiety disorder/ Mood: Provide ego support as has lot of anxiety about current condition. Continue celexa. LCSW to follow for evaluation.  4. Neuropsych: This patient is capable of making decisions on his own behalf.  5.  HTN: monitor with bid checks. Continue Cardura.  6. Neurogenic B/B:   bowel program with suppository daily. doxasosin hs---increased to 12mg  as bp can tolerate. urechoine 50mg   qid  -up to EOB to void or to toilet, double voids, massage--  -I am hopeful this will trigger soon. 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

## 2013-07-01 NOTE — Progress Notes (Signed)
Physical Therapy Session Note  Patient Details  Name: [name] MRN: [id number] Date of Birth: 07-04-1941  Today's Date: 07/01/2013 Time: 1430-1500 Time Calculation (min): 30 min  Skilled Therapeutic Interventions/Progress Updates:    Pt ready for session - motivated to work. Initially worked on single limb stance stability with bil. UE support from wall railing. Progressed to ambulation x 34', 100' with mod assist (Lt. AFO and bil. 3# ankle weights) pt able to modulate ataxia quite well initially but with fatigue he has increasing adduction/cross over stepping with the Lt. LE and decreasing pelvic stability. Pt needs wheelchair to follow as he has to sit quickly with fatigue.   Wheelchair propulsion modified independent.   Therapy Documentation Precautions:  Precautions Precautions: Back;Fall Precaution Comments: Reviewed back precautions with patient, patient able to verbalize 2/3 independently Restrictions Weight Bearing Restrictions: No Pain: Pain Assessment Pain Assessment: No/denies pain  See FIM for current functional status  Therapy/Group: Individual Therapy  Wilhemina Bonito 07/01/2013, 3:13 PM

## 2013-07-01 NOTE — Progress Notes (Signed)
Occupational Therapy Note  Patient Details  Name: [name] MRN: [id number] Date of Birth: 12/10/1940 Today's Date: 07/01/2013  Time: 8-8:54am ( .) Pt seen for transfers, functional mobility, activity tolerance, ADL retraining and overall safety during tasks. Pt sitting in w/c upon arrival, agreeable to shower and dress. Pt propelled himself in and out of bathroom and around room. Pt toileted first before entering shower. Pt leaned side to side to wash perineal and buttock area from shower chair. Pt completed dressing from w/c, standing when needed with CGA. Pt able to complete all dressing with increased time and supervision. Pt motivated to get better, although does appear to get frustrated at times when everyday tasks take longer. Pt used RW to ambulate to dresser to remove some clothing and sat to fold them. Intermittent verbal cues during session for overall pacing, as pt tends to move quickly. No pain reported.  Sharlize Hoar M Ximenna Fonseca 07/01/2013, 10:18 AM

## 2013-07-01 NOTE — Plan of Care (Signed)
Problem: SCI BLADDER ELIMINATION Goal: RH STG SCI MANAGE BLADDER PROGRAM W/ASSISTANCE Patient will be able to demonstrate in and out caths prior to discharge .Marland Kitchen  Outcome: Progressing To begin self-catherization education today

## 2013-07-01 NOTE — Plan of Care (Signed)
Problem: SCI BOWEL ELIMINATION Goal: RH STG SCI MANAGE BOWEL PROGRAM W/ASSIST OR AS APPROPRIATE STG SCI Manage bowel program with total assist .patient to Begin inserting suppository if able. Family will be able to demonstrate bowel program  Outcome: Progressing Will begin initiating pt self insertion of suppository

## 2013-07-01 NOTE — Progress Notes (Signed)
Patient ID: [name], male   DOB: [birth year], 72 y.o.   MRN: [id number] Still no gu/gi control. On abs for uti

## 2013-07-01 NOTE — Plan of Care (Signed)
Problem: RH SAFETY Goal: RH STG ADHERE TO SAFETY PRECAUTIONS W/ASSISTANCE/DEVICE STG Adhere to Safety Precautions With minimal Assistance/Device.  Outcome: Progressing No unsafe behavior noted     

## 2013-07-01 NOTE — Plan of Care (Signed)
Problem: SCI BLADDER ELIMINATION Goal: RH STG MANAGE BLADDER WITH ASSISTANCE STG Manage Bladder With max Assistance  Outcome: Progressing Requiring I&O cath by staff. Pt to begin education on self-catherization

## 2013-07-01 NOTE — Plan of Care (Signed)
Problem: SCI BOWEL ELIMINATION Goal: RH STG MANAGE BOWEL WITH ASSISTANCE STG Manage Bowel with total Assistance.  Outcome: Progressing 6am bowel program

## 2013-07-01 NOTE — Plan of Care (Signed)
Problem: RH SKIN INTEGRITY Goal: RH STG SKIN FREE OF INFECTION/BREAKDOWN Skin free of infection/breakdown with moderate assistance  Outcome: Progressing No evidence of breakdown at this time.

## 2013-07-01 NOTE — Progress Notes (Signed)
Lumbar sutures removed, per MD's order. Pt tolerated procedure well. Small visible opening noted to mid incision line, as well as fluid pocketing to top proximal, and R side of incisional line. Pam Love, PA called to assess pocketing, and visible opening. No new orders given at that time. Steri strips applied to area. NT reported a temperature of 99.3 at 1611. Temperature rechecked at 1800, temperature down to 97.3.

## 2013-07-01 NOTE — Plan of Care (Signed)
Problem: SCI BOWEL ELIMINATION Goal: RH STG SCI MANAGE BOWEL WITH MEDICATION WITH ASSISTANCE STG SCI Manage bowel with medication with total assistance.  Outcome: Progressing Receives daily suppository

## 2013-07-01 NOTE — Progress Notes (Signed)
Physical Therapy Weekly Progress Note  Patient Details  Name: [name] MRN: [id number] Date of Birth: [birth year]  Today's Date: 07/01/2013  Session #1: Time: 6962-9528 Time Calculation (min): 55 min Individual therapy; Denies pain. Session focused on neuro re-ed for LE coordination, motor control, and dynamic standing balance with various activities including Kinetron in standing, dynamic reaching activities in standing, and gait training with RW (3 reps x 6-8' each). Min A for sit to stand and balance in standing but when moving (gait) requires mod A. Discussed with pt that he will likely not be at the level he wants (mod I with RW) upon d/c (discussed goal is min A right now but mod I w/c level to which pt verbalized understanding). Seated LAQ during rest breaks of standing activities. W/c propulsion on unit mod I for general endurance and strengthening using LE's also.  Session #2: Time: 1300- 1345 (45 min) Individual therapy; Denies pain. Simulated car transfer from sedan (discouraged pt from using Expedition as he needs to step up on running board and anticipate a squat pivot transfer would be safer option) using squat pivot technique with min A x 3 repetitions. Supine therex for LE strengthening with focus on controlling and slowing down movements including heel slides, hip abductions, and SAQ x 10 reps x 3 sets each. Bridging x 10 reps x 3 sets. W/c propulsion back to room for endurance using LE's only. S squat pivot transfers.   Patient has met 3 of 4 short term goals.  Pt is making good progress on CIR but continues to demonstrate significant ataxia (trunkal and LE's) with mobility in standing (gait and standing transfers) which requires mod A and increased risk of fall. Pt is S for w/c level transfers at this time. Plan is to d/c on 12/12 and will initiate family education with pt's wife next week to prepare for d/c.  Patient continues to demonstrate the following deficits: gait,  balance, ataxia, motor control, sensation, pain, endurance, strength and therefore will continue to benefit from skilled PT intervention to enhance overall performance with activity tolerance, balance, postural control, ability to compensate for deficits, functional use of  right lower extremity and left lower extremity, coordination and knowledge of precautions.  Patient progressing toward long term goals..  Continue plan of care. Will continue to monitor goals. May need to downgrade stair goal to total A to bump up one step due to safety.  PT Short Term Goals Week 1:  PT Short Term Goal 1 (Week 1): Pt will be able to complete squat pivot transfer with S PT Short Term Goal 1 - Progress (Week 1): Met PT Short Term Goal 2 (Week 1): Pt will be able to complete stand pivot transfer with min A PT Short Term Goal 2 - Progress (Week 1): Met PT Short Term Goal 3 (Week 1): Pt will gait x 20' with min A PT Short Term Goal 3 - Progress (Week 1): Progressing toward goal PT Short Term Goal 4 (Week 1): Pt will propel w/c on unit > 150' mod I PT Short Term Goal 4 - Progress (Week 1): Met Week 2:  PT Short Term Goal 1 (Week 2): = LTGS  Skilled Therapeutic Interventions/Progress Updates:  Ambulation/gait training;Balance/vestibular training;Community reintegration;Discharge planning;Disease management/prevention;DME/adaptive equipment instruction;Functional mobility training;Neuromuscular re-education;Pain management;Patient/family education;Psychosocial support;Skin care/wound management;Splinting/orthotics;Stair training;Therapeutic Activities;Therapeutic Exercise;UE/LE Strength taining/ROM;UE/LE Coordination activities;Wheelchair propulsion/positioning   Therapy Documentation Precautions:  Precautions Precautions: Back;Fall Precaution Comments: Reviewed back precautions with patient, patient able to verbalize 2/3 independently Restrictions  Weight Bearing Restrictions: No  See FIM for current functional  status  Therapy/Group: Individual Therapy  Karolee Stamps [facility] 07/01/2013, 2:31 PM

## 2013-07-01 NOTE — Plan of Care (Signed)
Problem: RH SKIN INTEGRITY Goal: RH STG ABLE TO PERFORM INCISION/WOUND CARE W/ASSISTANCE STG Able To Perform Incision/Wound Care With moderate Assistance.  Outcome: Progressing Pt unable to reach lumbar incision

## 2013-07-01 NOTE — Plan of Care (Signed)
Problem: RH PAIN MANAGEMENT Goal: RH STG PAIN MANAGED AT OR BELOW PT'S PAIN GOAL Pain level 4 or less  Outcome: Not Progressing Reports pain as a 6, to lumbar

## 2013-07-02 ENCOUNTER — Inpatient Hospital Stay (HOSPITAL_COMMUNITY): Payer: Medicare Other | Admitting: Physical Therapy

## 2013-07-02 MED ORDER — DOXAZOSIN MESYLATE 8 MG PO TABS
8.0000 mg | ORAL_TABLET | Freq: Every day | ORAL | Status: DC
  Administered 2013-07-02 – 2013-07-07 (×6): 8 mg via ORAL
  Filled 2013-07-02 (×8): qty 1

## 2013-07-02 MED ORDER — TAMSULOSIN HCL 0.4 MG PO CAPS
0.4000 mg | ORAL_CAPSULE | Freq: Every day | ORAL | Status: DC
  Administered 2013-07-02 – 2013-07-08 (×7): 0.4 mg via ORAL
  Filled 2013-07-02 (×9): qty 1

## 2013-07-02 NOTE — Progress Notes (Signed)
Physical Therapy Session Note  Patient Details  Name: [name] MRN: [id number] Date of Birth: [birth year]  Today's Date: 07/02/2013 Time: 4098-1191 Time Calculation (min): 41 min  Short Term Goals: Week 1:  PT Short Term Goal 1 (Week 1): Pt will be able to complete squat pivot transfer with S PT Short Term Goal 1 - Progress (Week 1): Met PT Short Term Goal 2 (Week 1): Pt will be able to complete stand pivot transfer with min A PT Short Term Goal 2 - Progress (Week 1): Met PT Short Term Goal 3 (Week 1): Pt will gait x 20' with min A PT Short Term Goal 3 - Progress (Week 1): Progressing toward goal PT Short Term Goal 4 (Week 1): Pt will propel w/c on unit > 150' mod I PT Short Term Goal 4 - Progress (Week 1): Met  Skilled Therapeutic Interventions/Progress Updates:  Pt was seen bedside in the pm. Pt propelled w/c with B LEs for 150 feet and 200 feet for LE strengthening and endurance training. Pt ambulated with rolling walker, L AFO with weight, and min to mod A for 30 feet with verbal cues, ataxic gait. Tx worked on cone taps to focus on unilateral stance and weight shifting. Also focused on sit to stand transfers with rolling walker for strengthening.   Therapy Documentation Precautions:  Precautions Precautions: Back;Fall Precaution Comments: Reviewed back precautions with patient, patient able to verbalize 2/3 independently Restrictions Weight Bearing Restrictions: No General:   Pain: Mild c/o back pain.   Locomotion : Ambulation Ambulation/Gait Assistance: 3: Mod assist   See FIM for current functional status  Therapy/Group: Individual Therapy  Rayford Halsted 07/02/2013, 3:22 PM

## 2013-07-02 NOTE — Progress Notes (Addendum)
Subjective/Complaints: No new complaints. Back still remains a bit sore. Sutures out. Still not emptying Review of Systems -otherwise negative  Objective: Vital Signs: Blood pressure 162/79, pulse 69, temperature 98.2 F (36.8 C), temperature source Oral, resp. rate 18, height 6' (1.829 m), weight 82.555 kg (182 lb), SpO2 98.00%. No results found. Results for orders placed during the hospital encounter of 06/22/13 (from the past 72 hour(s))  BASIC METABOLIC PANEL     Status: Abnormal   Collection Time    06/30/13 10:50 AM      Result Value Range   Sodium 138  135 - 145 mEq/L   Potassium 4.2  3.5 - 5.1 mEq/L   Chloride 101  96 - 112 mEq/L   CO2 25  19 - 32 mEq/L   Glucose, Bld 91  70 - 99 mg/dL   BUN 24 (*) 6 - 23 mg/dL   Creatinine, Ser 1.61  0.50 - 1.35 mg/dL   Calcium 9.0  8.4 - 09.6 mg/dL   GFR calc non Af Amer 57 (*) >90 mL/min   GFR calc Af Amer 66 (*) >90 mL/min   Comment: (NOTE)     The eGFR has been calculated using the CKD EPI equation.     This calculation has not been validated in all clinical situations.     eGFR's persistently <90 mL/min signify possible Chronic Kidney     Disease.      Nursing note and vitals reviewed.  Constitutional: He is oriented to person, place, and time. He appears well-developed and well-nourished.  HENT:  Head: Normocephalic and atraumatic.  Mouth/Throat: Oropharynx is clear and moist.  Eyes: Conjunctivae are normal. Pupils are equal, round, and reactive to light.   Cardiovascular: Normal rate and regular rhythm.  Respiratory: Effort normal and breath sounds normal. No respiratory distress. He has no wheezes.  Skin: Midline lumbar incision small amount of serous drainage on 4 x 4, no bleeding, no wound dehiscence GI: Soft. Bowel sounds are normal. He exhibits no distension. There is no tenderness.  Large midline incision with large incision hernia   well healed -sutures out Musculoskeletal: He exhibits no edema. Mild tenderness over  left pecmajor Neurological: He is alert and oriented to person, place, and time.  Speech clear. Follows commands without difficulty.   Decreased S1 and L5 dermatomes sensationLeft and right. Motor strength is 3 minus right deltoid for at left deltoid 4/5 bilateral bicep triceps and grip 4- hip flexors,4- bilateral knee extensors and right ankle dorsiflexor and plantar flexor 0/5 left ankle dorsiflexor and plantar flexor. Right ankle 1 + to 2/5.  Skin: Skin is warm and dry. Surgical site healed with scar tissue noted. Sutures in place   Assessment/Plan: 1. Functional deficits secondary to cauda equina syndrome which require 3+ hours per day of interdisciplinary therapy in a comprehensive inpatient rehab setting. Physiatrist is providing close team supervision and 24 hour management of active medical problems listed below. Physiatrist and rehab team continue to assess barriers to discharge/monitor patient progress toward functional and medical goals.    FIM: FIM - Bathing Bathing Steps Patient Completed: Chest;Right Arm;Left Arm;Abdomen;Front perineal area;Buttocks;Right upper leg;Left upper leg;Right lower leg (including foot);Left lower leg (including foot) (LE with LH sponge) Bathing: 5: Supervision: Safety issues/verbal cues  FIM - Upper Body Dressing/Undressing Upper body dressing/undressing steps patient completed: Thread/unthread right sleeve of pullover shirt/dresss;Thread/unthread left sleeve of pullover shirt/dress;Put head through opening of pull over shirt/dress;Pull shirt over trunk Upper body dressing/undressing: 5: Supervision: Safety issues/verbal cues  FIM - Lower Body Dressing/Undressing Lower body dressing/undressing steps patient completed: Thread/unthread right pants leg;Thread/unthread left pants leg;Pull pants up/down;Fasten/unfasten pants;Don/Doff right sock;Don/Doff left sock;Don/Doff right shoe;Don/Doff left shoe;Fasten/unfasten right shoe;Fasten/unfasten left  shoe Lower body dressing/undressing: 6: More than reasonable amount of time  FIM - Toileting Toileting steps completed by patient: Adjust clothing prior to toileting;Performs perineal hygiene;Adjust clothing after toileting Toileting Assistive Devices: Grab bar or rail for support Toileting: 5: Supervision: Safety issues/verbal cues  FIM - Diplomatic Services operational officer Devices: Grab bars Toilet Transfers: 4-To toilet/BSC: Min A (steadying Pt. > 75%)  FIM - Bed/Chair Transfer Bed/Chair Transfer Assistive Devices: Arm rests Bed/Chair Transfer: 5: Bed > Chair or W/C: Supervision (verbal cues/safety issues);5: Chair or W/C > Bed: Supervision (verbal cues/safety issues)  FIM - Locomotion: Wheelchair Locomotion: Wheelchair: 6: Travels 150 ft or more, turns around, maneuvers to table, bed or toilet, negotiates 3% grade: maneuvers on rugs and over door sills independently FIM - Locomotion: Ambulation Locomotion: Ambulation Assistive Devices: Walker - Rolling;Orthosis Ambulation/Gait Assistance: 3: Mod assist Locomotion: Ambulation: 1: Travels less than 50 ft with moderate assistance (Pt: 50 - 74%)  Comprehension Comprehension Mode: Auditory Comprehension: 6-Follows complex conversation/direction: With extra time/assistive device  Expression Expression Mode: Verbal Expression: 6-Expresses complex ideas: With extra time/assistive device  Social Interaction Social Interaction: 7-Interacts appropriately with others - No medications needed.  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  1. DVT Prophylaxis/Anticoagulation: Pharmaceutical: Lovenox  2. Chronic pain/Pain Management: Has been on steroids X 1 year for pain management--willing to attempt taper during hospitalization. Continue neurontin for  neuropathy. Prn oxcodone for pain effective.  3. Anxiety disorder/ Mood: Provide ego support as has lot of anxiety about current condition. Continue celexa. LCSW to follow for evaluation.  4. Neuropsych: This patient is capable of making decisions on his own behalf.  5. HTN: monitor with bid checks. Continue Cardura.  6. Neurogenic B/B:   bowel program with suppository daily. doxasosin hs  -will reduce doxasosin back to 8mg  and add flomax in the am (bp permitting)  -continue urechoine 50mg   qid  -up to EOB to void or to toilet, double voids, massage--  -I am hopeful this will trigger soon.  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

## 2013-07-03 ENCOUNTER — Inpatient Hospital Stay (HOSPITAL_COMMUNITY): Payer: Medicare Other | Admitting: Physical Therapy

## 2013-07-03 NOTE — Progress Notes (Signed)
Physical Therapy Session Note  Patient Details  Name: [name] MRN: [id number] Date of Birth: 1940-08-09  Today's Date: 07/03/2013 Time: 4098-1191 Time Calculation (min): 48 min  Short Term Goals: Week 1:  PT Short Term Goal 1 (Week 1): Pt will be able to complete squat pivot transfer with S PT Short Term Goal 1 - Progress (Week 1): Met PT Short Term Goal 2 (Week 1): Pt will be able to complete stand pivot transfer with min A PT Short Term Goal 2 - Progress (Week 1): Met PT Short Term Goal 3 (Week 1): Pt will gait x 20' with min A PT Short Term Goal 3 - Progress (Week 1): Progressing toward goal PT Short Term Goal 4 (Week 1): Pt will propel w/c on unit > 150' mod I PT Short Term Goal 4 - Progress (Week 1): Met  Skilled Therapeutic Interventions/Progress Updates:  Pt was seen bedside in the am. Pt was unable to state any of his back precautions, precautions reviewed prior to treatment. Tx focused on LE strengthening including w/c mobility with B LEs, sitting and standing exercises with B LEs.    Therapy Documentation Precautions:  Precautions Precautions: Back;Fall Precaution Comments: Reviewed back precautions with patient, patient able to verbalize 2/3 independently Restrictions Weight Bearing Restrictions: No General:   Pain: Pt c/o 5/10 pain in back and B LEs, nursing at bedside to provide pain medicine prior to therapy.  See FIM for current functional status  Therapy/Group: Individual Therapy  Rayford Halsted 07/03/2013, 12:36 PM

## 2013-07-03 NOTE — Progress Notes (Signed)
Patient ID: [name], male   DOB: April 18, 1941, 72 y.o.   MRN: [id number] STABLE, URINE CLEAR. NO FEVER

## 2013-07-03 NOTE — Progress Notes (Signed)
Subjective/Complaints: Slept well. Feels urge to empty bowels this am. Still no results with voluntary, spontaneous attempts Review of Systems -otherwise negative  Objective: Vital Signs: Blood pressure 141/79, pulse 70, temperature 97.2 F (36.2 C), temperature source Oral, resp. rate 18, height 6' (1.829 m), weight 82.555 kg (182 lb), SpO2 96.00%. No results found. Results for orders placed during the hospital encounter of 06/22/13 (from the past 72 hour(s))  BASIC METABOLIC PANEL     Status: Abnormal   Collection Time    06/30/13 10:50 AM      Result Value Range   Sodium 138  135 - 145 mEq/L   Potassium 4.2  3.5 - 5.1 mEq/L   Chloride 101  96 - 112 mEq/L   CO2 25  19 - 32 mEq/L   Glucose, Bld 91  70 - 99 mg/dL   BUN 24 (*) 6 - 23 mg/dL   Creatinine, Ser 0.98  0.50 - 1.35 mg/dL   Calcium 9.0  8.4 - 11.9 mg/dL   GFR calc non Af Amer 57 (*) >90 mL/min   GFR calc Af Amer 66 (*) >90 mL/min   Comment: (NOTE)     The eGFR has been calculated using the CKD EPI equation.     This calculation has not been validated in all clinical situations.     eGFR's persistently <90 mL/min signify possible Chronic Kidney     Disease.      Nursing note and vitals reviewed.  Constitutional: He is oriented to person, place, and time. He appears well-developed and well-nourished.  HENT:  Head: Normocephalic and atraumatic.  Mouth/Throat: Oropharynx is clear and moist.  Eyes: Conjunctivae are normal. Pupils are equal, round, and reactive to light.   Cardiovascular: Normal rate and regular rhythm.  Respiratory: Effort normal and breath sounds normal. No respiratory distress. He has no wheezes.  Skin: Midline lumbar incision small amount of serous drainage on 4 x 4, no bleeding, no wound dehiscence GI: Soft. Bowel sounds are normal. He exhibits no distension. There is no tenderness.  Large midline incision with large incision hernia   well healed -sutures out Musculoskeletal: He exhibits no edema.  Mild tenderness over left pecmajor Neurological: He is alert and oriented to person, place, and time.  Speech clear. Follows commands without difficulty.   Decreased S1 and L5 dermatomes sensationLeft and right. Motor strength is 3 minus right deltoid for at left deltoid 4/5 bilateral bicep triceps and grip 4- hip flexors,4- bilateral knee extensors and right ankle dorsiflexor and plantar flexor 0/5 left ankle dorsiflexor and plantar flexor. Right ankle 1 + to 2/5.  Skin: Skin is warm and dry. Surgical site healed with scar tissue noted. Sutures in place   Assessment/Plan: 1. Functional deficits secondary to cauda equina syndrome which require 3+ hours per day of interdisciplinary therapy in a comprehensive inpatient rehab setting. Physiatrist is providing close team supervision and 24 hour management of active medical problems listed below. Physiatrist and rehab team continue to assess barriers to discharge/monitor patient progress toward functional and medical goals.    FIM: FIM - Bathing Bathing Steps Patient Completed: Chest;Right Arm;Left Arm;Abdomen;Front perineal area;Buttocks;Right upper leg;Left upper leg;Right lower leg (including foot);Left lower leg (including foot) (LE with LH sponge) Bathing: 5: Supervision: Safety issues/verbal cues  FIM - Upper Body Dressing/Undressing Upper body dressing/undressing steps patient completed: Thread/unthread right sleeve of pullover shirt/dresss;Thread/unthread left sleeve of pullover shirt/dress;Put head through opening of pull over shirt/dress;Pull shirt over trunk Upper body dressing/undressing: 5: Supervision: Safety  issues/verbal cues FIM - Lower Body Dressing/Undressing Lower body dressing/undressing steps patient completed: Thread/unthread right pants leg;Thread/unthread left pants leg;Pull pants up/down;Fasten/unfasten pants;Don/Doff right sock;Don/Doff left sock;Don/Doff right shoe;Don/Doff left shoe;Fasten/unfasten right  shoe;Fasten/unfasten left shoe Lower body dressing/undressing: 6: More than reasonable amount of time  FIM - Toileting Toileting steps completed by patient: Adjust clothing prior to toileting;Performs perineal hygiene;Adjust clothing after toileting Toileting Assistive Devices: Grab bar or rail for support Toileting: 5: Supervision: Safety issues/verbal cues  FIM - Diplomatic Services operational officer Devices: Grab bars Toilet Transfers: 4-To toilet/BSC: Min A (steadying Pt. > 75%)  FIM - Bed/Chair Transfer Bed/Chair Transfer Assistive Devices: Arm rests Bed/Chair Transfer: 5: Bed > Chair or W/C: Supervision (verbal cues/safety issues);5: Chair or W/C > Bed: Supervision (verbal cues/safety issues)  FIM - Locomotion: Wheelchair Locomotion: Wheelchair: 6: Travels 150 ft or more, turns around, maneuvers to table, bed or toilet, negotiates 3% grade: maneuvers on rugs and over door sills independently FIM - Locomotion: Ambulation Locomotion: Ambulation Assistive Devices: Walker - Rolling;Orthosis Ambulation/Gait Assistance: 3: Mod assist Locomotion: Ambulation: 2: Travels 50 - 149 ft with moderate assistance (Pt: 50 - 74%)  Comprehension Comprehension Mode: Auditory Comprehension: 6-Follows complex conversation/direction: With extra time/assistive device  Expression Expression Mode: Verbal Expression: 6-Expresses complex ideas: With extra time/assistive device  Social Interaction Social Interaction: 7-Interacts appropriately with others - No medications needed.  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  1. DVT Prophylaxis/Anticoagulation: Pharmaceutical: Lovenox  2. Chronic pain/Pain Management: Has been on steroids X 1 year for pain management--willing to attempt taper during hospitalization.  Continue neurontin for neuropathy. Prn oxcodone for pain effective.  3. Anxiety disorder/ Mood: Provide ego support as has lot of anxiety about current condition. Continue celexa. LCSW to follow for evaluation.  4. Neuropsych: This patient is capable of making decisions on his own behalf.  5. HTN: monitor with bid checks. Continue Cardura.  6. Neurogenic B/B:   bowel program with suppository daily. doxasosin hs  - reduced doxasosin back to 8mg  and add flomax in the am (bp permitting)  -continue urechoine 50mg   qid  -up to EOB to void or to toilet, double voids, massage--  -I am still hopeful this will trigger soon.  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

## 2013-07-04 ENCOUNTER — Inpatient Hospital Stay (HOSPITAL_COMMUNITY): Payer: Medicare Other | Admitting: Occupational Therapy

## 2013-07-04 ENCOUNTER — Inpatient Hospital Stay (HOSPITAL_COMMUNITY): Payer: Medicare Other

## 2013-07-04 MED ORDER — CITALOPRAM HYDROBROMIDE 20 MG PO TABS
20.0000 mg | ORAL_TABLET | Freq: Every day | ORAL | Status: DC
  Administered 2013-07-05 – 2013-07-08 (×4): 20 mg via ORAL
  Filled 2013-07-04 (×5): qty 1

## 2013-07-04 NOTE — Progress Notes (Signed)
Physical Therapy Session Note  Patient Details  Name: JORDANY RUSSETT MRN: [id number] Date of Birth: April 30, 1941  Today's Date: 07/04/2013 Time: 4259-5638 Time Calculation (min): 55 min  Short Term Goals: Week 2:  PT Short Term Goal 1 (Week 2): = LTGS  Skilled Therapeutic Interventions/Progress Updates:    Session focused on neuro re-ed using Wii Bowling and wii fit for dynamic standing balance, focus on motor control with sit to stands, step ups onto wii fit balance board, and proprioceptive and visual feedback with game. Pt required overall min A with activities with 1 episode of uncontrolled descent to w/c due to L knee buckling. Seated rest breaks needed often. Propelled w/c back to room and over carpeted surfaces with LE's only for functional strengthening and endurance.   Therapy Documentation Precautions:  Precautions Precautions: Back;Fall Precaution Comments: Reviewed back precautions with patient, patient able to verbalize 2/3 independently Restrictions Weight Bearing Restrictions: No  Pain: Premedicated per pt report, no complaints.  See FIM for current functional status  Therapy/Group: Individual Therapy and Co-Treatment with Recreational therapy  Karolee Stamps [facility] 07/04/2013, 11:56 AM

## 2013-07-04 NOTE — Progress Notes (Signed)
Physical Therapy Session Note  Patient Details  Name: [name] MRN: [id number] Date of Birth: 1941-03-24  Today's Date: 07/04/2013 Time: 1345-1430 Time Calculation (min): 45 min  Short Term Goals: Week 2:  PT Short Term Goal 1 (Week 2): = LTGS  Skilled Therapeutic Interventions/Progress Updates:    Session focused on functional gait with focus on step length, posture, turning (up to mod A needed), and distance x 4 repetitions of about 30' distance. Pt required increased A as fatigued, especially with turning due to decreased coordination and control of LE's. Sit to stands with close S/Steady A and cues for technique with stand to sit to reach back and demonstrate eccentric control. Seated and standing therex on Kinetron x 2 reps each for functional strengthening.  Therapy Documentation Precautions:  Precautions Precautions: Back;Fall Precaution Comments: Reviewed back precautions with patient, patient able to verbalize 2/3 independently Restrictions Weight Bearing Restrictions: No Pain:  Denies pain.   See FIM for current functional status  Therapy/Group: Individual Therapy  Karolee Stamps [facility] 07/04/2013, 2:43 PM

## 2013-07-04 NOTE — Plan of Care (Signed)
Problem: SCI BOWEL ELIMINATION Goal: RH STG MANAGE BOWEL WITH ASSISTANCE STG Manage Bowel with total Assistance.  Pt to self-administer suppository at 0600. Pt to lie on L side x 30 mins. Up to [facility] at 0630. Dig stim if necessary. Check rectal vault for emptying. Document consistency of stool.  Outcome: Progressing Staff administering suppository in the morning.

## 2013-07-04 NOTE — Progress Notes (Signed)
Occupational Therapy Session Notes  Patient Details  Name: [name] MRN: [id number] Date of Birth: 1941-01-13  Today's Date: 07/04/2013  Short Term Goals: Week 1:  OT Short Term Goal 1 (Week 1): Patient will perform LB dressing with maximal assistance (X1 person) OT Short Term Goal 1 - Progress (Week 1): Met OT Short Term Goal 2 (Week 1): Patient will complete toilet transfer (stand pivot method using RW) with moderate assistance OT Short Term Goal 2 - Progress (Week 1): Met OT Short Term Goal 3 (Week 1): Shower stall transfers will be discussed with patient regarding best transfer method for walk-in shower OT Short Term Goal 3 - Progress (Week 1): Met OT Short Term Goal 4 (Week 1): Patient will be educated on BUE strengthening exercises for a HEP OT Short Term Goal 4 - Progress (Week 1): Met  Week 2:  OT Short Term Goal 1 (Week 2): STGs = LTGs  Skilled Therapeutic Interventions/Progress Updates:   Session #1 [phone] - 60 Minutes Individual Therapy No complaints of pain, patient stated he recently took something for pain Patient found seated in w/c waiting on therapist. Patient had clothes prepared and set-out from night before. Patient able to verbalize 2/3 precautions, but functionally is able to adhere to precautions independently. Patient propelled self into bathroom and transferred onto shower seat. From here, patient completed UB/LB bathing in seated position with supervision; using long handled sponge prn for feet and back to increase independence. Patient transferred out of shower after bathing and completed UB/LB dressing in room in sit<>stand position from w/c. Patient able to stand and pull pants up to waist with close supervision. Patient continues to present with ataxic movements throughout BUEs, trunk, and BLEs. After ADL, patient propelled self to therapy gym and engaged in therapeutic exercise on NuStep machine, level 5 for 15 minutes. Patient propelled self back to  room at end of session and left with all needed items within reach.   Session #2 [id number] - 25 Minutes Individual Therapy No complaints of pain Patient found seated in w/c with NT present for bladder scan. Patient propelled self > therapy gym and engaged in therapeutic exercise focusing on sit<>stands, dynamic standing balance/tolerance/endurance, and BLE strengthening. Patient stood in order to put together the pipe tree. Worked on patient standing without UE support, patient continues to exhibit ataxic movements throughout. Patient with complaints of BLE fatigue at end of session. Patient propelled self back to room using BLEs and therapist left patient seated in w/c with needed items within reach.   Precautions:  Precautions Precautions: Back;Fall Precaution Comments: Reviewed back precautions with patient, patient able to verbalize 2/3 independently Restrictions Weight Bearing Restrictions: No  See FIM for current functional status  Christianna Belmonte 07/04/2013, 7:16 AM

## 2013-07-04 NOTE — Progress Notes (Signed)
Subjective/Complaints: Had small amounts of urine empty incontinently. Had urge to go but not control.  Review of Systems -otherwise negative  Objective: Vital Signs: Blood pressure 160/85, pulse 84, temperature 97.5 F (36.4 C), temperature source Oral, resp. rate 16, height 6' (1.829 m), weight 82.555 kg (182 lb), SpO2 95.00%. No results found. No results found for this or any previous visit (from the past 72 hour(s)).    Nursing note and vitals reviewed.  Constitutional: He is oriented to person, place, and time. He appears well-developed and well-nourished.  HENT:  Head: Normocephalic and atraumatic.  Mouth/Throat: Oropharynx is clear and moist.  Eyes: Conjunctivae are normal. Pupils are equal, round, and reactive to light.   Cardiovascular: Normal rate and regular rhythm.  Respiratory: Effort normal and breath sounds normal. No respiratory distress. He has no wheezes.  Skin: Midline lumbar incision small amount of serous drainage on 4 x 4, no bleeding, no wound dehiscence GI: Soft. Bowel sounds are normal. He exhibits no distension. There is no tenderness.  Large midline incision with large incision hernia   well healed -sutures out Musculoskeletal: He exhibits no edema. Mild tenderness over left pecmajor Neurological: He is alert and oriented to person, place, and time.  Speech clear. Follows commands without difficulty.   Decreased S1 and L5 dermatomes sensationLeft and right. Motor strength is 3 minus right deltoid for at left deltoid 4/5 bilateral bicep triceps and grip 4- hip flexors,4- bilateral knee extensors and right ankle dorsiflexor and plantar flexor 0/5 left ankle dorsiflexor and plantar flexor. Right ankle 1 + to 2/5.  Skin: Skin is warm and dry. Surgical site healed with scar tissue noted. Sutures in place. No swelling   Assessment/Plan: 1. Functional deficits secondary to cauda equina syndrome which require 3+ hours per day of interdisciplinary therapy in a  comprehensive inpatient rehab setting. Physiatrist is providing close team supervision and 24 hour management of active medical problems listed below. Physiatrist and rehab team continue to assess barriers to discharge/monitor patient progress toward functional and medical goals.    FIM: FIM - Bathing Bathing Steps Patient Completed: Chest;Right Arm;Left Arm;Abdomen;Front perineal area;Buttocks;Right upper leg;Left upper leg;Right lower leg (including foot);Left lower leg (including foot) (LE with LH sponge) Bathing: 5: Supervision: Safety issues/verbal cues  FIM - Upper Body Dressing/Undressing Upper body dressing/undressing steps patient completed: Thread/unthread right sleeve of pullover shirt/dresss;Thread/unthread left sleeve of pullover shirt/dress;Put head through opening of pull over shirt/dress;Pull shirt over trunk Upper body dressing/undressing: 5: Supervision: Safety issues/verbal cues FIM - Lower Body Dressing/Undressing Lower body dressing/undressing steps patient completed: Thread/unthread right pants leg;Thread/unthread left pants leg;Pull pants up/down;Fasten/unfasten pants;Don/Doff right sock;Don/Doff left sock;Don/Doff right shoe;Don/Doff left shoe;Fasten/unfasten right shoe;Fasten/unfasten left shoe Lower body dressing/undressing: 6: More than reasonable amount of time  FIM - Toileting Toileting steps completed by patient: Adjust clothing prior to toileting;Performs perineal hygiene;Adjust clothing after toileting Toileting Assistive Devices: Grab bar or rail for support Toileting: 5: Supervision: Safety issues/verbal cues  FIM - Diplomatic Services operational officer Devices: Grab bars Toilet Transfers: 4-To toilet/BSC: Min A (steadying Pt. > 75%)  FIM - Bed/Chair Transfer Bed/Chair Transfer Assistive Devices: Arm rests Bed/Chair Transfer: 5: Bed > Chair or W/C: Supervision (verbal cues/safety issues);5: Chair or W/C > Bed: Supervision (verbal cues/safety  issues)  FIM - Locomotion: Wheelchair Locomotion: Wheelchair: 6: Travels 150 ft or more, turns around, maneuvers to table, bed or toilet, negotiates 3% grade: maneuvers on rugs and over door sills independently FIM - Locomotion: Ambulation Locomotion: Ambulation Assistive Devices: Environmental consultant -  Rolling;Orthosis Ambulation/Gait Assistance: 3: Mod assist Locomotion: Ambulation: 2: Travels 50 - 149 ft with moderate assistance (Pt: 50 - 74%)  Comprehension Comprehension Mode: Auditory Comprehension: 6-Follows complex conversation/direction: With extra time/assistive device  Expression Expression Mode: Verbal Expression: 6-Expresses complex ideas: With extra time/assistive device  Social Interaction Social Interaction: 7-Interacts appropriately with others - No medications needed.  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  1. DVT Prophylaxis/Anticoagulation: Pharmaceutical: Lovenox  2. Chronic pain/Pain Management: Has been on steroids X 1 year for pain management--willing to attempt taper during hospitalization. Continue neurontin for neuropathy. Prn oxcodone for pain effective.  3. Anxiety disorder/ Mood: Provide ego support as has lot of anxiety about current condition. Continue celexa. LCSW to follow for evaluation.  4. Neuropsych: This patient is capable of making decisions on his own behalf.  5. HTN: monitor with bid checks. Continue Cardura.  6. Neurogenic B/B:   bowel program with suppository daily. 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

## 2013-07-04 NOTE — Progress Notes (Signed)
Patient ID: [name], male   DOB: [birth year], 72 y.o.   MRN: [id number] Stable, no c/o

## 2013-07-04 NOTE — Plan of Care (Signed)
Problem: RH SKIN INTEGRITY Goal: RH STG SKIN FREE OF INFECTION/BREAKDOWN Skin free of infection/breakdown with Min assistance  Outcome: Progressing Skin abrasion to L elbow is healing. Lumbar incision healing appropriately, decrease fluid to proximal end of incision, now on L distal end

## 2013-07-04 NOTE — Progress Notes (Signed)
Recreational Therapy Session Note  Patient Details  Name: GATLIN KITTELL MRN: [id number] Date of Birth: January 30, 1941 Today's Date: 07/04/2013  Pain: no c/o Skilled Therapeutic Interventions/Progress Updates: Session focused on activity tolerance, dynamic sitting/standing balance, & coordination during Wii games.  Pt stood with 1 UE support on RW for wii bowling with Min assist & min-mod assist for ski jump activity with BUE support.  Pt with 1 LOB at end of session requiring assist to lower to w/c.  Pt propels w/c on unit with mod I.  Therapy/Group: Co-Treatment   Dhani Dannemiller 07/04/2013, 11:51 AM

## 2013-07-04 NOTE — Plan of Care (Signed)
Problem: RH SKIN INTEGRITY Goal: RH STG ABLE TO PERFORM INCISION/WOUND CARE W/ASSISTANCE STG Able To Perform Incision/Wound Care With Assistance. Min A  Outcome: Progressing Incision healing appropriately

## 2013-07-04 NOTE — Plan of Care (Signed)
Problem: SCI BLADDER ELIMINATION Goal: RH STG MANAGE BLADDER WITH EQUIPMENT WITH ASSISTANCE STG Manage Bladder With Equipment With Min Assistance  Pt is self-cathing

## 2013-07-04 NOTE — Plan of Care (Signed)
Problem: SCI BLADDER ELIMINATION Goal: RH STG MANAGE BLADDER WITH ASSISTANCE STG Manage Bladder With Min Assistance.  Instruct pt to self I&O cath  Outcome: Progressing Pt is self-catherizing

## 2013-07-04 NOTE — Plan of Care (Signed)
Problem: SCI BLADDER ELIMINATION Goal: RH STG MANAGE BLADDER WITH MEDICATION WITH ASSISTANCE STG Manage Bladder With Medication With min Assistance.  Outcome: Progressing Currently on urocholeine

## 2013-07-04 NOTE — Plan of Care (Signed)
Problem: RH PAIN MANAGEMENT Goal: RH STG PAIN MANAGED AT OR BELOW PT'S PAIN GOAL Pain level 4 or less  Outcome: Not Progressing Pt reports pain rating of 5

## 2013-07-05 ENCOUNTER — Inpatient Hospital Stay (HOSPITAL_COMMUNITY): Payer: Medicare Other | Admitting: Occupational Therapy

## 2013-07-05 ENCOUNTER — Inpatient Hospital Stay (HOSPITAL_COMMUNITY): Payer: Medicare Other

## 2013-07-05 ENCOUNTER — Inpatient Hospital Stay (HOSPITAL_COMMUNITY): Payer: Medicare Other | Admitting: *Deleted

## 2013-07-05 DIAGNOSIS — G834 Cauda equina syndrome: Secondary | ICD-10-CM

## 2013-07-05 MED ORDER — PREDNISONE 10 MG PO TABS
10.0000 mg | ORAL_TABLET | Freq: Every day | ORAL | Status: DC
  Administered 2013-07-06 – 2013-07-08 (×3): 10 mg via ORAL
  Filled 2013-07-05 (×4): qty 1

## 2013-07-05 NOTE — Progress Notes (Signed)
Physical Therapy Session Note  Patient Details  Name: [name] MRN: [id number] Date of Birth: 1941/06/27  Today's Date: 07/05/2013 Time: 4098-1191 Time Calculation (min): 40 min  Short Term Goals: Week 2:  PT Short Term Goal 1 (Week 2): = LTGS  Skilled Therapeutic Interventions/Progress Updates:    Session focused on family education with pt's wife. Demonstrated and educated on basic transfers, w/c parts and breakdown of w/c, simulated car transfer, bed mobility, back precautions, how to bump w/c up/down curb step for home entry, and demonstrated gait with RW. Pt's wife able to verbalize understanding of all safety recommendations made but due to her own health limitations, wife unable to provide physical A (ex. Bumping w/c up/down curb step or lifting w/c into the car. Pt and wife plan to have grandson present to A with these tasks and will likely have ramp built for home entry. Both feel confident in explaining this to grandson. ). Recommended to pt and pt's wife to only ambulate with therapist present at home due to decreased balance and wife unable to provide appropriate amount of A as pt continues to require up to mod A when fatigued and cues for awareness of LLE due to ataxia. Both in agreement and understanding.   Therapy Documentation Precautions:  Precautions Precautions: Back;Fall Precaution Comments: Reviewed back precautions with patient, patient able to verbalize 2/3 independently Restrictions Weight Bearing Restrictions: No Pain: Denies pain.   See FIM for current functional status  Therapy/Group: Individual Therapy  Karolee Stamps [facility] 07/05/2013, 1:50 PM

## 2013-07-05 NOTE — Progress Notes (Signed)
Recreational Therapy Session Note  Patient Details  Name: [name] MRN: [id number] Date of Birth: 04/12/1941 Today's Date: 07/05/2013  Pain: no c/o Skilled Therapeutic Interventions/Progress Updates: Session focused on activity tolerance, dynamic standing balance, LE coordination during leisure tasks standing with RW with min assist.  Therapy/Group: Co-Treatment   Yakir Wenke 07/05/2013, 4:03 PM

## 2013-07-05 NOTE — Progress Notes (Signed)
Patient asking about trial of decreasing prednisone as he has been doing well. Will decrease to 10 mg and monitor.

## 2013-07-05 NOTE — Progress Notes (Signed)
Subjective/Complaints: Still not having luck with emptying although he feels the urge  Review of Systems -otherwise negative  Objective: Vital Signs: Blood pressure 156/79, pulse 52, temperature 97.8 F (36.6 C), temperature source Oral, resp. rate 18, height 6' (1.829 m), weight 82.555 kg (182 lb), SpO2 99.00%. No results found. No results found for this or any previous visit (from the past 72 hour(s)).    Nursing note and vitals reviewed.  Constitutional: He is oriented to person, place, and time. He appears well-developed and well-nourished.  HENT:  Head: Normocephalic and atraumatic.  Mouth/Throat: Oropharynx is clear and moist.  Eyes: Conjunctivae are normal. Pupils are equal, round, and reactive to light.   Cardiovascular: Normal rate and regular rhythm.  Respiratory: Effort normal and breath sounds normal. No respiratory distress. He has no wheezes.  Skin: Midline lumbar incision small amount of serous drainage on 4 x 4, no bleeding, no wound dehiscence GI: Soft. Bowel sounds are normal. He exhibits no distension. There is no tenderness.  Large midline incision with large incision hernia   well healed -sutures out Musculoskeletal: He exhibits no edema. Mild tenderness over left pecmajor Neurological: He is alert and oriented to person, place, and time.  Speech clear. Follows commands without difficulty.   Decreased S1 and L5 dermatomes sensationLeft and right. Motor strength is 3 minus right deltoid for at left deltoid 4/5 bilateral bicep triceps and grip 4- hip flexors,4- bilateral knee extensors and right ankle dorsiflexor and plantar flexor 0/5 left ankle dorsiflexor and plantar flexor. Right ankle 1 + to 2/5.  Skin: Skin is warm and dry. Surgical site healed with scar tissue noted with some bunching of skin in this area. There is NO fluid collection.   Assessment/Plan: 1. Functional deficits secondary to cauda equina syndrome which require 3+ hours per day of  interdisciplinary therapy in a comprehensive inpatient rehab setting. Physiatrist is providing close team supervision and 24 hour management of active medical problems listed below. Physiatrist and rehab team continue to assess barriers to discharge/monitor patient progress toward functional and medical goals.    FIM: FIM - Bathing Bathing Steps Patient Completed: Chest;Right Arm;Left Arm;Abdomen;Front perineal area;Buttocks;Right upper leg;Left upper leg;Right lower leg (including foot);Left lower leg (including foot) Bathing: 5: Supervision: Safety issues/verbal cues  FIM - Upper Body Dressing/Undressing Upper body dressing/undressing steps patient completed: Thread/unthread right sleeve of pullover shirt/dresss;Thread/unthread left sleeve of pullover shirt/dress;Put head through opening of pull over shirt/dress;Pull shirt over trunk Upper body dressing/undressing: 7: Complete Independence: No helper FIM - Lower Body Dressing/Undressing Lower body dressing/undressing steps patient completed: Thread/unthread right pants leg;Thread/unthread left pants leg;Pull pants up/down;Fasten/unfasten pants;Don/Doff right sock;Don/Doff left sock;Don/Doff right shoe;Don/Doff left shoe;Fasten/unfasten right shoe;Fasten/unfasten left shoe Lower body dressing/undressing: 5: Supervision: Safety issues/verbal cues  FIM - Toileting Toileting steps completed by patient: Adjust clothing prior to toileting;Performs perineal hygiene;Adjust clothing after toileting Toileting Assistive Devices: Grab bar or rail for support Toileting: 5: Supervision: Safety issues/verbal cues  FIM - Diplomatic Services operational officer Devices: Grab bars Toilet Transfers: 4-To toilet/BSC: Min A (steadying Pt. > 75%)  FIM - Bed/Chair Transfer Bed/Chair Transfer Assistive Devices: Arm rests Bed/Chair Transfer: 5: Bed > Chair or W/C: Supervision (verbal cues/safety issues);5: Chair or W/C > Bed: Supervision (verbal cues/safety  issues)  FIM - Locomotion: Wheelchair Locomotion: Wheelchair: 6: Travels 150 ft or more, turns around, maneuvers to table, bed or toilet, negotiates 3% grade: maneuvers on rugs and over door sills independently FIM - Locomotion: Ambulation Locomotion: Ambulation Assistive Devices: Environmental consultant -  Rolling;Orthosis Ambulation/Gait Assistance: 4: Min assist Locomotion: Ambulation: 1: Travels less than 50 ft with minimal assistance (Pt.>75%)  Comprehension Comprehension Mode: Auditory Comprehension: 7-Follows complex conversation/direction: With no assist  Expression Expression Mode: Verbal Expression: 6-Expresses complex ideas: With extra time/assistive device  Social Interaction Social Interaction: 7-Interacts appropriately with others - No medications needed.  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  1. DVT Prophylaxis/Anticoagulation: Pharmaceutical: Lovenox  2. Chronic pain/Pain Management: Has been on steroids X 1 year for pain management--willing to attempt taper during hospitalization. Continue neurontin for neuropathy. Prn oxcodone for pain effective.  3. Anxiety disorder/ Mood: Provide ego support as has lot of anxiety about current condition. Continue celexa. LCSW to follow for evaluation.  4. Neuropsych: This patient is capable of making decisions on his own behalf.  5. HTN: monitor with bid checks. Continue Cardura.  6. Neurogenic B/B:   bowel program with suppository daily. 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

## 2013-07-05 NOTE — Progress Notes (Signed)
Occupational Therapy Session Notes  Patient Details  Name: [name] MRN: [id number] Date of Birth: May 05, 1941  Today's Date: 07/05/2013  Short Term Goals: Week 1:  OT Short Term Goal 1 (Week 1): Patient will perform LB dressing with maximal assistance (X1 person) OT Short Term Goal 1 - Progress (Week 1): Met OT Short Term Goal 2 (Week 1): Patient will complete toilet transfer (stand pivot method using RW) with moderate assistance OT Short Term Goal 2 - Progress (Week 1): Met OT Short Term Goal 3 (Week 1): Shower stall transfers will be discussed with patient regarding best transfer method for walk-in shower OT Short Term Goal 3 - Progress (Week 1): Met OT Short Term Goal 4 (Week 1): Patient will be educated on BUE strengthening exercises for a HEP OT Short Term Goal 4 - Progress (Week 1): Met  Week 2:  OT Short Term Goal 1 (Week 2): STGs = LTGs  Skilled Therapeutic Interventions/Progress Updates:   Session #1 [phone] - 55 Minutes Individual Therapy No complaints of pain Patient found seated in w/c waiting on therapist. Patient gathered all needed items and propelled self > ADL apartment. ADL retraining completed at shower level using tub transfer bench in tub/shower unit. Transfer and UB/LB bathing completed at a supervision level. From here, patient transferred back to w/c and completed UB/LB dressing at supervision level; standing in order to pull pants up to waist. As of now, patient is using brief secondary to some incontinence problems, patient plans to purchase depends for home use. Patient donned TED hose to LLE per order and socks & shoes independently. After ADL, patient propelled self > therapy gym and engaged in therapeutic exercise using ergometer for ~10 minutes. At end of session, left patient seated in w/c in gym waiting on PT.   Session #2 [id number] - 30 Minutes Individual Therapy No complaints of pain Treatment focus on family education to patient's wife.  Patient engaged in tub/shower transfer using tub transfer bench, BSC transfer using standard BSC, and sit<>stand with wife and therapist. Wife is okay to provide the needed supervision prn at discharge. Therapist left patient and wife to propel back to room.   Precautions:  Precautions Precautions: Back;Fall Precaution Comments: Reviewed back precautions with patient, patient able to verbalize 2/3 independently Restrictions Weight Bearing Restrictions: No  See FIM for current functional status  [name] 07/05/2013, 7:18 AM

## 2013-07-05 NOTE — Progress Notes (Signed)
Physical Therapy Session Note  Patient Details  Name: [name] MRN: [id number] Date of Birth: [birth year]  Today's Date: 07/05/2013 Time: 0830-0928 Time Calculation (min): 58 min  Short Term Goals: Week 2:  PT Short Term Goal 1 (Week 2): = LTGS  Skilled Therapeutic Interventions/Progress Updates:   Session focused on functional transfers, simulated car transfer squat pivot technique to sedan (S), neuro re-ed in seated and standing for LE coordination and control using soccer ball to kick (alternating LE's, stopping soccer ball, etc), and gait training with RW with min A x 4 bouts of about 25-30' with focus on postural control, safety awareness, endurance.   Therapy Documentation Precautions:  Precautions Precautions: Back;Fall Precaution Comments: Reviewed back precautions with patient, patient able to verbalize 2/3 independently Restrictions Weight Bearing Restrictions: No  Pain: Premedicated for low back pain.  See FIM for current functional status  Therapy/Group: Individual Therapy and Co-Treatment  Karolee Stamps [facility] 07/05/2013, 9:32 AM

## 2013-07-05 NOTE — Progress Notes (Signed)
Patient ID: [name], male   DOB: [birth year], 72 y.o.   MRN: [id number] Stable. Spirit up. Dc this friday

## 2013-07-06 ENCOUNTER — Inpatient Hospital Stay (HOSPITAL_COMMUNITY): Payer: Medicare Other | Admitting: Occupational Therapy

## 2013-07-06 ENCOUNTER — Inpatient Hospital Stay (HOSPITAL_COMMUNITY): Payer: Medicare Other

## 2013-07-06 DIAGNOSIS — G834 Cauda equina syndrome: Secondary | ICD-10-CM

## 2013-07-06 LAB — CREATININE, SERUM
GFR calc Af Amer: 68 mL/min — ABNORMAL LOW (ref >90)
GFR calc non Af Amer: 58 mL/min — ABNORMAL LOW (ref >90)

## 2013-07-06 NOTE — Progress Notes (Signed)
Social Work Patient ID: [name], male   DOB: 20-Nov-1940, 72 y.o.   MRN: [id number]   Met with patient following team conference.  Pleased with progress and feels on target for 12/12 d/c.  Some quesitons about follow up and DME - answered.  Family ed should be completed tomorrow.    Jenisa Monty, LCSW

## 2013-07-06 NOTE — Progress Notes (Signed)
Social Work Patient ID: [name], male   DOB: 1941/05/09, 72 y.o.   MRN: [id number]  Amada Jupiter, LCSW Social Worker Signed  Patient Care Conference Service date: 07/06/2013 1:23 PM  Inpatient RehabilitationTeam Conference and Plan of Care Update Date: 07/05/2013   Time: 2:05 PM     Patient Name: [name]       Medical Record Number: [id number]   Date of Birth: [birth year] Sex: Male         Room/Bed: 4W08C/4W08C-01 Payor Info: Payor: MEDICARE / Plan: MEDICARE PART A AND B / Product Type: *No Product type* /   Admitting Diagnosis: cauda equina syndrome   Admit Date/Time:  06/22/2013  4:52 PM Admission Comments: No comment available   Primary Diagnosis:  <principal problem not specified> Principal Problem: <principal problem not specified>    Patient Active Problem List     Diagnosis  Date Noted   .  Cauda equina syndrome  06/22/2013   .  Osteonecrosis  05/05/2013   .  Small bowel obstruction  04/17/2013   .  Acute renal failure  04/17/2013   .  Medial meniscus, posterior horn derangement  02/17/2013   .  Knee sprain and strain  02/17/2013   .  Trigger point with neck pain  03/18/2012   .  Rotator cuff tear arthropathy of right shoulder  09/08/2011   .  CAD in native artery  01/09/2011   .  Ankle fracture  12/25/2010   .  Contusion of shoulder, left  12/25/2010   .  PEMPHIGUS VULGARIS  07/02/2010   .  MRSA  05/13/2010   .  PRURITUS  05/13/2010   .  SPINAL STENOSIS, LUMBAR  05/13/2010   .  HYPERLIPIDEMIA  04/24/2010   .  GASTROESOPHAGEAL REFLUX DISEASE  04/24/2010   .  VENTRAL HERNIA, INCISIONAL  04/24/2010   .  DEGENERATIVE JOINT DISEASE  04/24/2010   .  PYOGENIC ARTHRITIS, SHOULDER REGION  02/04/2010   .  HYPERTENSION  01/01/2010   .  RUPTURE ROTATOR CUFF  02/19/2009     Expected Discharge Date: Expected Discharge Date: 07/08/13  Team Members Present: Physician leading conference: Dr. Faith Rogue Social Worker Present: Amada Jupiter, LCSW Nurse Present:  Other (comment) Efraim Kaufmann Ramgeet, RN) PT Present: Karolee Stamps, Jerrye Bushy, PT OT Present: Edwin Cap, Loistine Chance, OT PPS Coordinator present : Tora Duck, RN, CRRN        Current Status/Progress  Goal  Weekly Team Focus   Medical     still not voiding. pain better. able to self-cath now  see prior  education, rx uti, see prior   Bowel/Bladder     Pt is on bowel program. Requires In and out catheter q 6 hrs. Continent of bowel   Managed bowel/bladder with min assist  Pt continues to improve in self care   Swallow/Nutrition/ Hydration            ADL's     overall supervision from a w/c and minimal standing time  overall mod I (sit<>stand for management of pants)  sit<>stands, dynamic standing balance/tolerance, functional transfers, functional mobility, ADL retraining   Mobility     S w/c level; min to mod A for standing/gait with RW  mod I w/c level; min A short distance gait goals (may need to downgrade distance and for CE only), min A currently for 1 step into house but may need to demo bumping up in w/c  neuro re-ed for balance/ataxia/coordination, functional strengthening,  family education, endurance   Communication            Safety/Cognition/ Behavioral Observations    One assist with transfers  Able to transfer without injury  Continue to be free from injury   Pain     Pt has not complained of any pain so far on this shift. He has order for oxy ir  5-10 mg q4hr PRN  pain level of <3  control pain to maintain pain goal   Skin     Incision at the back healing well  as well as Lt elbow skin tear  No further skin issues  Monitor for signs of infection at the surgical site and lt elbow skin tear    Rehab Goals Patient on target to meet rehab goals: Yes *See Care Plan and progress notes for long and short-term goals.    Barriers to Discharge:  see prior, ataxia/coordination issues with change in direction      Possible Resolutions to Barriers:    see prior       Discharge Planning/Teaching Needs:    home with wife who can provide light physical assistance      Team Discussion:    Continue to treat UTI. Pt has learned how to cath.  Plan possible outing this week.  Family ed begin today.  Reaching mod i w/c goals.  Improved impulsivity.   Revisions to Treatment Plan:    None    Continued Need for Acute Rehabilitation Level of Care: The patient requires daily medical management by a physician with specialized training in physical medicine and rehabilitation for the following conditions: Daily direction of a multidisciplinary physical rehabilitation program to ensure safe treatment while eliciting the highest outcome that is of practical value to the patient.: Yes Daily medical management of patient stability for increased activity during participation in an intensive rehabilitation regime.: Yes Daily analysis of laboratory values and/or radiology reports with any subsequent need for medication adjustment of medical intervention for : Neurological problems;Other  Markeia Harkless 07/06/2013, 1:24 PM         Amada Jupiter, LCSW Social Worker Signed  Patient Care Conference Service date: 06/28/2013 7:42 PM  Inpatient RehabilitationTeam Conference and Plan of Care Update Date: 06/28/2013   Time: 2:05 PM     Patient Name: [name]       Medical Record Number: [id number]   Date of Birth: 02-27-1941 Sex: Male         Room/Bed: 4W08C/4W08C-01 Payor Info: Payor: MEDICARE / Plan: MEDICARE PART A AND B / Product Type: *No Product type* /   Admitting Diagnosis: cauda equina syndrome   Admit Date/Time:  06/22/2013  4:52 PM Admission Comments: No comment available   Primary Diagnosis:  <principal problem not specified> Principal Problem: <principal problem not specified>    Patient Active Problem List     Diagnosis  Date Noted   .  Cauda equina syndrome  06/22/2013   .  Osteonecrosis  05/05/2013   .  Small bowel obstruction  04/17/2013   .  Acute  renal failure  04/17/2013   .  Medial meniscus, posterior horn derangement  02/17/2013   .  Knee sprain and strain  02/17/2013   .  Trigger point with neck pain  03/18/2012   .  Rotator cuff tear arthropathy of right shoulder  09/08/2011   .  CAD in native artery  01/09/2011   .  Ankle fracture  12/25/2010   .  Contusion of shoulder,  left  12/25/2010   .  PEMPHIGUS VULGARIS  07/02/2010   .  MRSA  05/13/2010   .  PRURITUS  05/13/2010   .  SPINAL STENOSIS, LUMBAR  05/13/2010   .  HYPERLIPIDEMIA  04/24/2010   .  GASTROESOPHAGEAL REFLUX DISEASE  04/24/2010   .  VENTRAL HERNIA, INCISIONAL  04/24/2010   .  DEGENERATIVE JOINT DISEASE  04/24/2010   .  PYOGENIC ARTHRITIS, SHOULDER REGION  02/04/2010   .  HYPERTENSION  01/01/2010   .  RUPTURE ROTATOR CUFF  02/19/2009     Expected Discharge Date: Expected Discharge Date: 07/08/13  Team Members Present: Physician leading conference: Dr. 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 pt education  see above, back precautions   Bowel/Bladder     Bowel program qAM and I&O cath q6-8hrs for volumes >350cc  Mod A with bowel and bladder  Continent of bowel and bladder   Swallow/Nutrition/ Hydration            ADL's     supervision > min assist with w/c level and static standing tasks, +2 for functional gait with RW  overall mod I from w/c level; lateral leans for LB ADLs  ADL retraining, functional transfers, functional mobility, BUE/BLE strengthening, sit<>stands, dynamic standing, coordination with active movements   Mobility     steady A squat pivot transfers; mod to  max A stand pivot or gait with RW (+2 for safety)  mod I w/c level; min A short distance gait goals and for 1 step into house  neuro re-ed for balance, proprioceptive input with LE's, gait and coordination; functional strengthening and endurance, transfers   Communication            Safety/Cognition/ Behavioral Observations           Pain     5-10mg  Oxycodone q4hr PRN for pain  3 or less on a pain scale of 0-10  Assess pain qshift and reassess after every pain intervention   Skin     Surgical skin incision covered with gauze dressing, 2 small skin tears to Rt. buttocks covered with foam dressing  No new skin breakdown  Monitor and assess q shift for skin breakdown    Rehab Goals Patient on target to meet rehab goals: Yes *See Care Plan and progress notes for long and short-term goals.    Barriers to Discharge:  persistent neurological deficits, a bit impulsive      Possible Resolutions to Barriers:    pt education, adaptive equipment      Discharge Planning/Teaching Needs:    home with wife who can provide light physical assistance      Team Discussion:    B/B issues ongoing.  Need to completely stop bedpan use and have pt up for all voiding.  Starting to feel some fullness/ gas.  Very motivated with goals at mod i overall and short distance gait.     Revisions to Treatment Plan:    None    Continued Need for Acute Rehabilitation Level of Care: The patient requires daily medical management by a physician with specialized  training in physical medicine and rehabilitation for the following conditions: Daily direction of a multidisciplinary physical rehabilitation program to ensure safe treatment while eliciting the highest outcome that is of practical value to the patient.: Yes Daily medical management of patient stability for increased activity during participation in an intensive rehabilitation regime.: Yes Daily analysis of laboratory values and/or radiology reports with any  subsequent need for medication adjustment of medical intervention for : Other;Neurological problems;Post surgical problems  Raylyn Speckman 06/28/2013, 7:42 PM

## 2013-07-06 NOTE — Progress Notes (Signed)
Subjective/Complaints: Some spontaneous, incontinent voiding, still has urge when bladder is full. Review of Systems -otherwise negative  Objective: Vital Signs: Blood pressure 153/75, pulse 67, temperature 97.7 F (36.5 C), temperature source Oral, resp. rate 18, height 6' (1.829 m), weight 83.915 kg (185 lb), SpO2 98.00%. No results found. Results for orders placed during the hospital encounter of 06/22/13 (from the past 72 hour(s))  CREATININE, SERUM     Status: Abnormal   Collection Time    07/06/13  4:40 AM      Result Value Range   Creatinine, Ser 1.22  0.50 - 1.35 mg/dL   GFR calc non Af Amer 58 (*) >90 mL/min   GFR calc Af Amer 68 (*) >90 mL/min   Comment: (NOTE)     The eGFR has been calculated using the CKD EPI equation.     This calculation has not been validated in all clinical situations.     eGFR's persistently <90 mL/min signify possible Chronic Kidney     Disease.        Constitutional: He is oriented to person, place, and time. He appears well-developed and well-nourished.  HENT:  Head: Normocephalic and atraumatic.  Mouth/Throat: Oropharynx is clear and moist.  Eyes: Conjunctivae are normal. Pupils are equal, round, and reactive to light.   Cardiovascular: Normal rate and regular rhythm.  Respiratory: Effort normal and breath sounds normal. No respiratory distress. He has no wheezes.  Skin: Midline lumbar incision small amount of serous drainage on 4 x 4, no bleeding, no wound dehiscence GI: Soft. Bowel sounds are normal. He exhibits no distension. There is no tenderness.  Large midline incision with large incision hernia  Musculoskeletal: He exhibits no edema. Mild tenderness over left pecmajor Neurological: He is alert and oriented to person, place, and time.  Speech clear. Follows commands without difficulty.   Decreased S1 and L5 dermatomes sensationLeft and right. Motor strength is 3 minus right deltoid for at left deltoid 4/5 bilateral bicep triceps  and grip 4- hip flexors,4- bilateral knee extensors and right ankle dorsiflexor and plantar flexor 0/5 left ankle dorsiflexor and plantar flexor. Right ankle 1 + to 2/5.  Skin: Skin is warm and dry. Surgical site healed with scar tissue noted with some bunching of skin in this area. There is NO fluid collection.   Assessment/Plan: 1. Functional deficits secondary to cauda equina syndrome which require 3+ hours per day of interdisciplinary therapy in a comprehensive inpatient rehab setting. Physiatrist is providing close team supervision and 24 hour management of active medical problems listed below. Physiatrist and rehab team continue to assess barriers to discharge/monitor patient progress toward functional and medical goals.    FIM: FIM - Bathing Bathing Steps Patient Completed: Chest;Right Arm;Left Arm;Abdomen;Front perineal area;Buttocks;Right upper leg;Left upper leg;Right lower leg (including foot);Left lower leg (including foot) Bathing: 5: Supervision: Safety issues/verbal cues  FIM - Upper Body Dressing/Undressing Upper body dressing/undressing steps patient completed: Thread/unthread right sleeve of pullover shirt/dresss;Thread/unthread left sleeve of pullover shirt/dress;Put head through opening of pull over shirt/dress;Pull shirt over trunk Upper body dressing/undressing: 7: Complete Independence: No helper FIM - Lower Body Dressing/Undressing Lower body dressing/undressing steps patient completed: Thread/unthread right pants leg;Thread/unthread left pants leg;Pull pants up/down;Fasten/unfasten pants;Don/Doff right sock;Don/Doff left sock;Don/Doff right shoe;Don/Doff left shoe;Fasten/unfasten right shoe;Fasten/unfasten left shoe Lower body dressing/undressing: 5: Supervision: Safety issues/verbal cues  FIM - Toileting Toileting steps completed by patient: Adjust clothing prior to toileting;Performs perineal hygiene;Adjust clothing after toileting Toileting Assistive Devices: Grab  bar or rail for support  Toileting: 5: Supervision: Safety issues/verbal cues  FIM - Diplomatic Services operational officer Devices: Grab bars Toilet Transfers: 4-To toilet/BSC: Min A (steadying Pt. > 75%)  FIM - Bed/Chair Transfer Bed/Chair Transfer Assistive Devices: Arm rests Bed/Chair Transfer: 5: Bed > Chair or W/C: Supervision (verbal cues/safety issues);5: Chair or W/C > Bed: Supervision (verbal cues/safety issues)  FIM - Locomotion: Wheelchair Locomotion: Wheelchair: 6: Travels 150 ft or more, turns around, maneuvers to table, bed or toilet, negotiates 3% grade: maneuvers on rugs and over door sills independently FIM - Locomotion: Ambulation Locomotion: Ambulation Assistive Devices: Walker - Rolling;Orthosis Ambulation/Gait Assistance: 4: Min assist Locomotion: Ambulation: 1: Travels less than 50 ft with minimal assistance (Pt.>75%)  Comprehension Comprehension Mode: Auditory Comprehension: 7-Follows complex conversation/direction: With no assist  Expression Expression Mode: Verbal Expression: 6-Expresses complex ideas: With extra time/assistive device  Social Interaction Social Interaction: 7-Interacts appropriately with others - No medications needed.  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  1. DVT Prophylaxis/Anticoagulation: Pharmaceutical: Lovenox  2. Chronic pain/Pain Management: Has been on steroids X 1 year for pain management--willing to attempt taper during hospitalization. Continue neurontin for neuropathy. Prn oxcodone for pain effective.  3. Anxiety disorder/ Mood: Provide ego support as has lot of anxiety about current condition. Continue celexa. LCSW to follow for evaluation.  4. Neuropsych: This patient is capable of making decisions on his own behalf.  5. HTN: monitor  with bid checks. Continue Cardura.  6. Neurogenic B/B:   bowel program with suppository daily.    - doxasosin 8mg  and flomax 4mg   -continue urechoine 50mg   qid  -up to EOB to void or to toilet, double voids, massage--   -enterococcus in urine---amoxil started Friday  -pt able to self- cath  -experiencing some incontinent voiding currently on occasion 7. DJD/DDD: On chronic steroids for skin condition and pain--      LOS (Days) 14 A FACE TO FACE EVALUATION WAS PERFORMED  SWARTZ,ZACHARY T 07/06/2013, 8:00 AM

## 2013-07-06 NOTE — Patient Care Conference (Signed)
Inpatient RehabilitationTeam Conference and Plan of Care Update Date: 07/05/2013   Time: 2:05 PM    Patient Name: [name]      Medical Record Number: [id number]  Date of Birth: 1941/07/10 Sex: Male         Room/Bed: 4W08C/4W08C-01 Payor Info: Payor: MEDICARE / Plan: MEDICARE PART A AND B / Product Type: *No Product type* /    Admitting Diagnosis: cauda equina syndrome  Admit Date/Time:  06/22/2013  4:52 PM Admission Comments: No comment available   Primary Diagnosis:  <principal problem not specified> Principal Problem: <principal problem not specified>  Patient Active Problem List   Diagnosis Date Noted  . Cauda equina syndrome 06/22/2013  . Osteonecrosis 05/05/2013  . Small bowel obstruction 04/17/2013  . Acute renal failure 04/17/2013  . Medial meniscus, posterior horn derangement 02/17/2013  . Knee sprain and strain 02/17/2013  . Trigger point with neck pain 03/18/2012  . Rotator cuff tear arthropathy of right shoulder 09/08/2011  . CAD in native artery 01/09/2011  . Ankle fracture 12/25/2010  . Contusion of shoulder, left 12/25/2010  . PEMPHIGUS VULGARIS 07/02/2010  . MRSA 05/13/2010  . PRURITUS 05/13/2010  . SPINAL STENOSIS, LUMBAR 05/13/2010  . HYPERLIPIDEMIA 04/24/2010  . GASTROESOPHAGEAL REFLUX DISEASE 04/24/2010  . VENTRAL HERNIA, INCISIONAL 04/24/2010  . DEGENERATIVE JOINT DISEASE 04/24/2010  . PYOGENIC ARTHRITIS, SHOULDER REGION 02/04/2010  . HYPERTENSION 01/01/2010  . RUPTURE ROTATOR CUFF 02/19/2009    Expected Discharge Date: Expected Discharge Date: 07/08/13  Team Members Present: Physician leading conference: Dr. Faith Rogue Social Worker Present: Amada Jupiter, LCSW Nurse Present: Other (comment) Efraim Kaufmann Ramgeet, RN) PT Present: Karolee Stamps, Jerrye Bushy, PT OT Present: Edwin Cap, Loistine Chance, OT PPS Coordinator present : Tora Duck, RN, CRRN     Current Status/Progress Goal Weekly Team Focus  Medical   still not  voiding. pain better. able to self-cath now  see prior  education, rx uti, see prior   Bowel/Bladder   Pt is on bowel program. Requires In and out catheter q 6 hrs. Continent of bowel   Managed bowel/bladder with min assist  Pt continues to improve in self care   Swallow/Nutrition/ Hydration             ADL's   overall supervision from a w/c and minimal standing time  overall mod I (sit<>stand for management of pants)  sit<>stands, dynamic standing balance/tolerance, functional transfers, functional mobility, ADL retraining   Mobility   S w/c level; min to mod A for standing/gait with RW  mod I w/c level; min A short distance gait goals (may need to downgrade distance and for CE only), min A currently for 1 step into house but may need to demo bumping up in w/c  neuro re-ed for balance/ataxia/coordination, functional strengthening, family education, endurance   Communication             Safety/Cognition/ Behavioral Observations  One assist with transfers  Able to transfer without injury  Continue to be free from injury   Pain   Pt has not complained of any pain so far on this shift. He has order for oxy ir  5-10 mg q4hr PRN  pain level of <3  control pain to maintain pain goal   Skin   Incision at the back healing well  as well as Lt elbow skin tear  No further skin issues  Monitor for signs of infection at the surgical site and lt elbow skin tear    Rehab Goals  Patient on target to meet rehab goals: Yes *See Care Plan and progress notes for long and short-term goals.  Barriers to Discharge: see prior, ataxia/coordination issues with change in direction    Possible Resolutions to Barriers:  see prior    Discharge Planning/Teaching Needs:  home with wife who can provide light physical assistance      Team Discussion:  Continue to treat UTI. Pt has learned how to cath.  Plan possible outing this week.  Family ed begin today.  Reaching mod i w/c goals.  Improved impulsivity.   Revisions to Treatment Plan:  None   Continued Need for Acute Rehabilitation Level of Care: The patient requires daily medical management by a physician with specialized training in physical medicine and rehabilitation for the following conditions: Daily direction of a multidisciplinary physical rehabilitation program to ensure safe treatment while eliciting the highest outcome that is of practical value to the patient.: Yes Daily medical management of patient stability for increased activity during participation in an intensive rehabilitation regime.: Yes Daily analysis of laboratory values and/or radiology reports with any subsequent need for medication adjustment of medical intervention for : Neurological problems;Other  [name] 07/06/2013, 1:24 PM

## 2013-07-06 NOTE — Progress Notes (Signed)
Occupational Therapy Session Notes  Patient Details  Name: DAREN YEAGLE MRN: [id number] Date of Birth: June 06, 1941  Today's Date: 07/06/2013  Short Term Goals: Week 1:  OT Short Term Goal 1 (Week 1): Patient will perform LB dressing with maximal assistance (X1 person) OT Short Term Goal 1 - Progress (Week 1): Met OT Short Term Goal 2 (Week 1): Patient will complete toilet transfer (stand pivot method using RW) with moderate assistance OT Short Term Goal 2 - Progress (Week 1): Met OT Short Term Goal 3 (Week 1): Shower stall transfers will be discussed with patient regarding best transfer method for walk-in shower OT Short Term Goal 3 - Progress (Week 1): Met OT Short Term Goal 4 (Week 1): Patient will be educated on BUE strengthening exercises for a HEP OT Short Term Goal 4 - Progress (Week 1): Met  Week 2:  OT Short Term Goal 1 (Week 2): STGs = LTGs  Skilled Therapeutic Interventions/Progress Updates:   Session #1 0735 - 0830 - 55 Minutes Individual Therapy No complaints of pain Patient found seated on elevated toilet seat in bathroom completing toileting tasks at mod I level. Patient transferred from toilet seat > w/c at mod I level then gathered all necessary items for his ADL. Patient propelled self from room > ADL apartment, transferred onto tub transfer bench and performed UB/LB bathing at mod I level. From here, patient transferred out of shower; supervision for shower transfers at this time. Patient sat in w/c for UB/and part of LB dressing. Patient stood at counter (simulated like at home) for therapist to donn brief and in order for him to pull up his pants. After ADL, patient propelled self to therapy gym and engaged in therapeutic exercise using NuStep machine for ~10 minutes on level 5 for focus on BLE strengthening and overall activity tolerance/endurance. When asked how hard this exercise was, patient stated "it's medium". May try level 6 tomorrow. At end of session, left  patient seated in w/c in gym waiting on next therapist.   Session #2 [id number] - 45 Minutes Individual Therapy No complaints of pain Patient found seated in w/c. Patient propelled self > ADL apartment. Focused skilled intervention on home management tasks from a w/c level; meal prep in kitchen and vacuuming in bedroom. Patient able to complete these tasks with set-up assistance. Therapist administered w/c bag > patient for transportation of items at home. Patient and therapist discussed d/c planning and patient excited to go home, but doesn't want to be a burden on his wife. Patient propelled self back to room independently and is mod I in room for transfers.   Precautions:  Precautions Precautions: Back;Fall Precaution Comments: Reviewed back precautions with patient, patient able to verbalize 2/3 independently Restrictions Weight Bearing Restrictions: No  See FIM for current functional status  Hadriel Northup 07/06/2013, 7:20 AM

## 2013-07-06 NOTE — Progress Notes (Signed)
Physical Therapy Session Note  Patient Details  Name: [name] MRN: [id number] Date of Birth: 1941-02-09  Today's Date: 07/06/2013 Time: 9147-8295 Time Calculation (min): 57 min  Short Term Goals: Week 2:  PT Short Term Goal 1 (Week 2): = LTGS  Skilled Therapeutic Interventions/Progress Updates:   Session focused on short distance ambulation with RW and neuro re-ed to work on coordination and motor control of LE's alternating toe taps on 4" step, progressed to step ups (stepping up and down with RLE - unable to coordinate LLE down first), and then progressed to going up/down 3 steps (4") coming down backwards with min to mod A (heavy reliance on UE's/rails noted). Supine therex for functional strengthening including bridging, hip adduction squeezes on bolster with 10 second hold, and abduction against manual resistance x 3 sets of 10 reps each.  Therapy Documentation Precautions:  Precautions Precautions: Back;Fall Precaution Comments: Reviewed back precautions with patient, patient able to verbalize 2/3 independently Restrictions Weight Bearing Restrictions: No  Pain: Premedicated for back pain.  See FIM for current functional status  Therapy/Group: Individual Therapy  Karolee Stamps [facility] 07/06/2013, 8:34 AM

## 2013-07-06 NOTE — Progress Notes (Signed)
Physical Therapy Session Note  Patient Details  Name: [name] MRN: [id number] Date of Birth: 1940/10/05  Today's Date: 07/06/2013 Time: 1400-1430 Time Calculation (min): 30 min  Short Term Goals: Week 2:  PT Short Term Goal 1 (Week 2): = LTGS  Skilled Therapeutic Interventions/Progress Updates:   Co-treat with Recreational therapy to work on dynamic standing balance, weightshifting, and visual feedback on Bi[employer] for [employer]; cues and facilitation for weighshifting through LE's vs. Trunk. Required heavy mod A to ascend/descend the step onto the machine. Pt with urge for BM and returned to room to transfer to toilet. Pt required A for brief management, but transfers were mod I. Issued handout of LE HEP that had been reviewed with patient.  Therapy Documentation Precautions:  Precautions Precautions: Back;Fall Precaution Comments: Reviewed back precautions with patient, patient able to verbalize 2/3 independently Restrictions Weight Bearing Restrictions: No  Pain: No complaints  See FIM for current functional status  Therapy/Group: Individual Therapy  Karolee Stamps [facility] 07/06/2013, 3:51 PM

## 2013-07-07 ENCOUNTER — Encounter (HOSPITAL_COMMUNITY): Payer: Medicare Other | Admitting: *Deleted

## 2013-07-07 ENCOUNTER — Inpatient Hospital Stay (HOSPITAL_COMMUNITY): Payer: Medicare Other

## 2013-07-07 ENCOUNTER — Inpatient Hospital Stay (HOSPITAL_COMMUNITY): Payer: Medicare Other | Admitting: Occupational Therapy

## 2013-07-07 NOTE — Progress Notes (Signed)
Patient ID: [name], male   DOB: [birth year], 72 y.o.   MRN: [id number]

## 2013-07-07 NOTE — Progress Notes (Signed)
Recreational Therapy Session Note  Patient Details  Name: [name] MRN: [id number] Date of Birth: 1940-08-22 Today's Date: 07/07/2013  Pain: no c/o Skilled Therapeutic Interventions/Progress Updates: Pt participated in community reintegration/outing to [employer] w/c level with supervision/mod I.  Education provided on energy conversation techniques, accessing public restroom, & overall safety.  Corrissa Martello 07/07/2013, 4:14 PM

## 2013-07-07 NOTE — Progress Notes (Addendum)
Physical Therapy Session Note  Patient Details  Name: DEVONTRE SIEDSCHLAG MRN: [id number] Date of Birth: [birth year]  Today's Date: 07/07/2013 Time: 1230-1430 Time Calculation (min): 120 min  Skilled Therapeutic Interventions/Progress Updates:   Pt participated in community lunch outing to [employer] with focus on w/c mobility in the community, functional transfers on [place] and in [employer], problem solving through various scenarios and discussion of energy conservation techniques. Pt functioning at mod I/S w/c level in the community.   Therapy Documentation Precautions:  Precautions Precautions: Back;Fall Precaution Comments: Patient able to independently verbalize and adhere to 3/3 back precautions Required Braces or Orthoses:  (has abdominal binder longstanding) Restrictions Weight Bearing Restrictions: No  Pain: Denies pain.  See FIM for current functional status  Therapy/Group: Community Reintegration with Recreational Therapy  Karolee Stamps [facility] 07/07/2013, 2:54 PM

## 2013-07-07 NOTE — Progress Notes (Signed)
Occupational Therapy Session Note & Discharge Summary  Patient Details  Name: [name] MRN: [id number] Date of Birth: 1940/10/24  Today's Date: 07/07/2013  SESSION NOTE [id number] - 55 Minutes Individual Therapy No complaints of pain Patient found seated in w/c waiting on therapist. Patient gathered all necessary items for his ADL. Patient then propelled self > ADL apartment and engaged in bathing & dressing from tub/shower level using tub transfer bench. Patient is performing at an overall modified independent level, except requires supervision for shower transfers. Patient is able to perform furniture transfers on lower surfaces at a modified independent level. Patient is mod I with sit<>stands and for brief dynamic standing tasks. After ADL, patient propelled self > therapy gym and engaged in therapeutic exercise using ergometer on level 10 for ~10 minutes. Patient left seated in w/c in gym waiting on next therapist.   ----------------------------------------------------------------------------------------------------------------------------  DISCHARGE SUMMARY Patient has met 12 of 12 long term goals due to improved activity tolerance, improved balance, postural control, ability to compensate for deficits, functional use of  RIGHT lower and LEFT lower extremity, improved attention, improved awareness and improved coordination.  Patient to discharge at overall Modified Independent level.  Patient's care partner is independent to provide the necessary supervision prn assistance at discharge.    Reasons goals not met: n/a, all goals met at this time.  Recommendation:  Patient will benefit from ongoing skilled OT services in home health setting to continue to advance functional skills in the area of BADL, iADL and Reduce care partner burden.  Equipment: tub transfer bench and standard BSC  Reasons for discharge: treatment goals met and discharge from hospital  Patient/family agrees  with progress made and goals achieved: Yes  Precautions/Restrictions  Precautions Precautions: Back;Fall Precaution Comments: Patient able to independently verbalize and adhere to 3/3 back precautions Restrictions Weight Bearing Restrictions: No  Vital Signs Therapy Vitals Temp: 98 F (36.7 C) Temp src: Oral Pulse Rate: 52 Resp: 20 BP: 138/64 mmHg Patient Position, if appropriate: Sitting Oxygen Therapy SpO2: 96 % O2 Device: None (Room air)  Pain Pain Assessment Pain Score: 0-No pain  ADL - See FIM  Vision/Perception  Vision - History Baseline Vision: Wears glasses all the time Patient Visual Report: No change from baseline Vision - Assessment Eye Alignment: Within Functional Limits Perception Perception: Within Functional Limits Praxis Praxis: Intact   Cognition Overall Cognitive Status: Within Functional Limits for tasks assessed Orientation Level: Oriented X4 Memory: Appears intact Awareness: Appears intact Problem Solving: Appears intact Safety/Judgment: Appears intact  Sensation Sensation Additional Comments: BUEs appear intact. Patient states some numbness and tingling in right hand secondary to operation. Coordination Gross Motor Movements are Fluid and Coordinated: Yes (BUEs appear intact, patient does present with ataxia throughout BUEs & BLEs) Fine Motor Movements are Fluid and Coordinated: Yes  Motor  Motor Motor: Ataxia  Trunk/Postural Assessment  Cervical Assessment Cervical Assessment: Within Functional Limits Thoracic Assessment Thoracic Assessment: Within Functional Limits (mild kyphosis ) Lumbar Assessment Lumbar Assessment: Exceptions to [facility] (back precautions) Postural Control Postural Control:  (decreased trunk control/ataxia in open chair)   Balance Balance Balance Assessed: Yes Static Sitting Balance Static Sitting - Level of Assistance: 7: Independent Dynamic Sitting Balance Dynamic Sitting - Level of Assistance: 6:  Modified independent (Device/Increase time) Static Standing Balance Static Standing - Level of Assistance: 5: Stand by assistance (with RW) Dynamic Standing Balance Dynamic Standing - Level of Assistance:  (with RW)  Extremity/Trunk Assessment RUE Assessment RUE Assessment: Exceptions to [facility] RUE AROM (  degrees) Overall AROM Right Upper Extremity: Deficits;Due to premorbid status (same as admission, patient able to compensate and functionally use BUEs for all needs) RUE PROM (degrees) Overall PROM Right Upper Extremity: Within functional limits for tasks performed LUE Assessment LUE Assessment: Exceptions to [facility] LUE AROM (degrees) Overall AROM Left Upper Extremity: Deficits;Due to premorbid status (same as admission, patient able to compensate and functionally use BUEs for all needs) LUE PROM (degrees) Overall PROM Left Upper Extremity: Within functional limits for tasks assessed  See FIM for current functional status  Gurbani Figge 07/07/2013, 1:42 PM

## 2013-07-07 NOTE — Progress Notes (Signed)
Subjective/Complaints: No new issues. Has some "dribbling" but no continent emptying. Independent in his room Review of Systems -otherwise negative  Objective: Vital Signs: Blood pressure 138/64, pulse 52, temperature 98 F (36.7 C), temperature source Oral, resp. rate 20, height 6' (1.829 m), weight 83.915 kg (185 lb), SpO2 96.00%. No results found. Results for orders placed during the hospital encounter of 06/22/13 (from the past 72 hour(s))  CREATININE, SERUM     Status: Abnormal   Collection Time    07/06/13  4:40 AM      Result Value Range   Creatinine, Ser 1.22  0.50 - 1.35 mg/dL   GFR calc non Af Amer 58 (*) >90 mL/min   GFR calc Af Amer 68 (*) >90 mL/min   Comment: (NOTE)     The eGFR has been calculated using the CKD EPI equation.     This calculation has not been validated in all clinical situations.     eGFR's persistently <90 mL/min signify possible Chronic Kidney     Disease.        Constitutional: He is oriented to person, place, and time. He appears well-developed and well-nourished.  HENT:  Head: Normocephalic and atraumatic.  Mouth/Throat: Oropharynx is clear and moist.  Eyes: Conjunctivae are normal. Pupils are equal, round, and reactive to light.   Cardiovascular: Normal rate and regular rhythm.  Respiratory: Effort normal and breath sounds normal. No respiratory distress. He has no wheezes.  Skin: Midline lumbar incision small amount of serous drainage on 4 x 4, no bleeding, no wound dehiscence GI: Soft. Bowel sounds are normal. He exhibits no distension. There is no tenderness.  Large midline incision with large incision hernia  Musculoskeletal: He exhibits no edema. Mild tenderness over left pecmajor Neurological: He is alert and oriented to person, place, and time.  Speech clear. Follows commands without difficulty.   Decreased S1 and L5 dermatomes sensationLeft and right. Motor strength is 3 minus right deltoid for at left deltoid 4/5 bilateral bicep  triceps and grip 4- hip flexors,4- bilateral knee extensors and right ankle dorsiflexor and plantar flexor 0/5 left ankle dorsiflexor and plantar flexor. Right ankle 1 + to 2/5.  Skin: Skin is warm and dry. Surgical site healed with scar tissue noted with some bunching of skin in this area. There is NO fluid collection.   Assessment/Plan: 1. Functional deficits secondary to cauda equina syndrome which require 3+ hours per day of interdisciplinary therapy in a comprehensive inpatient rehab setting. Physiatrist is providing close team supervision and 24 hour management of active medical problems listed below. Physiatrist and rehab team continue to assess barriers to discharge/monitor patient progress toward functional and medical goals.    FIM: FIM - Bathing Bathing Steps Patient Completed: Chest;Right Arm;Left Arm;Abdomen;Front perineal area;Buttocks;Right upper leg;Left upper leg;Right lower leg (including foot);Left lower leg (including foot) Bathing: 6: Assistive device (Comment)  FIM - Upper Body Dressing/Undressing Upper body dressing/undressing steps patient completed: Thread/unthread right sleeve of pullover shirt/dresss;Thread/unthread left sleeve of pullover shirt/dress;Put head through opening of pull over shirt/dress;Pull shirt over trunk Upper body dressing/undressing: 7: Complete Independence: No helper FIM - Lower Body Dressing/Undressing Lower body dressing/undressing steps patient completed: Thread/unthread right pants leg;Thread/unthread left pants leg;Pull pants up/down;Fasten/unfasten pants;Don/Doff right sock;Don/Doff left sock;Don/Doff right shoe;Don/Doff left shoe;Fasten/unfasten right shoe;Fasten/unfasten left shoe Lower body dressing/undressing: 6: Assistive device (Comment)  FIM - Toileting Toileting steps completed by patient: Adjust clothing prior to toileting;Performs perineal hygiene;Adjust clothing after toileting Toileting Assistive Devices: Grab bar or rail for  support Toileting: 6: Assistive device: No helper  FIM - Diplomatic Services operational officer Devices: Grab bars;Elevated toilet seat Toilet Transfers: 6-Assistive device: No helper  FIM - Banker Devices: Arm rests Bed/Chair Transfer: 5: Bed > Chair or W/C: Supervision (verbal cues/safety issues);5: Chair or W/C > Bed: Supervision (verbal cues/safety issues)  FIM - Locomotion: Wheelchair Locomotion: Wheelchair: 6: Travels 150 ft or more, turns around, maneuvers to table, bed or toilet, negotiates 3% grade: maneuvers on rugs and over door sills independently FIM - Locomotion: Ambulation Locomotion: Ambulation Assistive Devices: Walker - Rolling;Orthosis Ambulation/Gait Assistance: 4: Min assist Locomotion: Ambulation: 1: Travels less than 50 ft with minimal assistance (Pt.>75%)  Comprehension Comprehension Mode: Auditory Comprehension: 7-Follows complex conversation/direction: With no assist  Expression Expression Mode: Verbal Expression: 7-Expresses complex ideas: With no assist  Social Interaction Social Interaction: 7-Interacts appropriately with others - No medications needed.  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  1. DVT Prophylaxis/Anticoagulation: Pharmaceutical: Lovenox  2. Chronic pain/Pain Management: Has been on steroids X 1 year for pain management--willing to attempt taper during hospitalization. Continue neurontin for neuropathy. Prn oxcodone for pain effective.  3. Anxiety disorder/ Mood: Provide ego support as has lot of anxiety about current condition. Continue celexa. LCSW to follow for evaluation.  4. Neuropsych: This patient is capable of making decisions on his own behalf.  5. HTN: controlled. Continue Cardura.  6.  Neurogenic B/B:   bowel program with suppository daily.    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

## 2013-07-07 NOTE — Progress Notes (Signed)
Recreational Therapy Session Note  Patient Details  Name: [name] MRN: [id number] Date of Birth: June 02, 1941 Today's Date: 07/07/2013  Pain: no c/o Skilled Therapeutic Interventions/Progress Updates: Session focused on activity tolerance, dynamic standing balance, weight shifting using biodex.  Pt with urge to have BM, so returned to room for toileting.  Also had discussion with pt about community reintegration/outing, pt agreeable to participate tomorrow.  Therapy/Group: Co-Treatment Rhen Dossantos 07/07/2013, 8:42 AM

## 2013-07-07 NOTE — Progress Notes (Signed)
Physical Therapy Discharge Summary  Patient Details  Name: CEASER EBELING MRN: [id number] Date of Birth: [birth year]  Today's Date: 07/07/2013 Time: 0830-0900 Time Calculation (min): 30 min Individual therapy; Denies pain. Session focused on gait training with RW, functional transfers with RW and mod I from w/c, stair negotiation for neuro-re-ed for LE's focusing on motor control and coordination as well as strength, and reviewing back precautions and bed mobility. Pt gait with RW x 40' with min A (no use of ankle weights) with focus on step length and coordination in LLE.    Patient has met 8 of 8 long term goals due to improved activity tolerance, improved balance, improved postural control, increased strength, decreased pain, ability to compensate for deficits, functional use of  right lower extremity and left lower extremity and improved coordination.  Patient to discharge at a wheelchair level Modified Independent.   Patient's wife  is able to provide the necessary supervision/set-up assistance at discharge. Pt and wife able to verbalize technique to bump w/c up 1 step for home entry if ramp not completed. Pt's wife unable to provide physical A at this time and both pt and wife in agreement and understanding of recommendation not to ambulate at home without therapy present.  Reasons goals not met: n/a. All goals met at this time.  Recommendation:  Patient will benefit from ongoing skilled PT services in home health setting to continue to advance safe functional mobility, address ongoing impairments in gait, motor control, ataxia, balance, endurance, functional strength, and minimize fall risk.  Equipment: 18x18 w/c with basic cushion, RW  Reasons for discharge: treatment goals met and discharge from hospital  Patient/family agrees with progress made and goals achieved: Yes  PT Discharge Precautions/Restrictions Precautions Precautions: Back;Fall Precaution Comments: Patient able to  independently verbalize and adhere to 3/3 back precautions Required Braces or Orthoses:  (has abdominal binder longstanding) Restrictions Weight Bearing Restrictions: No Pain Pain Assessment Pain Assessment: No/denies pain Pain Score: 0-No pain Vision/Perception  Vision - History Baseline Vision: Wears glasses all the time Patient Visual Report: No change from baseline Vision - Assessment Eye Alignment: Within Functional Limits Perception Perception: Within Functional Limits Praxis Praxis: Intact  Cognition Overall Cognitive Status: Within Functional Limits for tasks assessed Orientation Level: Oriented X4 Memory: Appears intact Awareness: Appears intact Problem Solving: Appears intact Safety/Judgment: Appears intact Sensation Sensation Light Touch: Impaired Detail Light Touch Impaired Details: Impaired LLE (LLE more diminished) Proprioception: Impaired by gross assessment Additional Comments: BUEs appear intact. Patient states some numbness and tingling in right hand secondary to operation. Coordination Gross Motor Movements are Fluid and Coordinated: No (ataxic in LE's) Fine Motor Movements are Fluid and Coordinated: Yes Motor  Motor Motor: Ataxia  Locomotion  Ambulation Ambulation/Gait Assistance: 4: Min assist  Trunk/Postural Assessment  Cervical Assessment Cervical Assessment: Within Functional Limits Thoracic Assessment Thoracic Assessment: Within Functional Limits (mild kyphosis) Lumbar Assessment Lumbar Assessment: Exceptions to [facility] (back precautions) Postural Control Postural Control:  (decreased trunk control/ataxia in open chain)  Balance Balance Balance Assessed: Yes Static Sitting Balance Static Sitting - Level of Assistance: 7: Independent Dynamic Sitting Balance Dynamic Sitting - Level of Assistance: 6: Modified independent (Device/Increase time) Static Standing Balance Static Standing - Level of Assistance: 5: Stand by assistance (with  RW) Dynamic Standing Balance Dynamic Standing - Level of Assistance: 5: Stand by assistance (with RW) Extremity Assessment  RUE Assessment RUE Assessment: Exceptions to [facility] RUE AROM (degrees) Overall AROM Right Upper Extremity: Deficits;Due to premorbid status (same as admission,  patient able to compensate and functionally use BUEs for all needs) RUE PROM (degrees) Overall PROM Right Upper Extremity: Within functional limits for tasks performed LUE Assessment LUE Assessment: Exceptions to [facility] LUE AROM (degrees) Overall AROM Left Upper Extremity: Deficits;Due to premorbid status (same as admission, patient able to compensate and functionally use BUEs for all needs) LUE PROM (degrees) Overall PROM Left Upper Extremity: Within functional limits for tasks assessed RLE Assessment RLE Assessment: Exceptions to [facility] RLE Strength RLE Overall Strength Comments: grossly 3+ to 4-/5; decreased coordination LLE Assessment LLE Assessment: Exceptions to [facility] LLE Strength LLE Overall Strength Comments: grossly 3+/5 hip and knee; foot drop with L AFO.   See FIM for current functional status  Karolee Stamps [facility] 07/07/2013, 2:58 PM

## 2013-07-08 MED ORDER — PREDNISONE 10 MG PO TABS: 10.0000 mg | ORAL_TABLET | Freq: Every day | ORAL | Status: DC

## 2013-07-08 MED ORDER — GABAPENTIN 300 MG PO CAPS: 300.0000 mg | ORAL_CAPSULE | Freq: Three times a day (TID) | ORAL | Status: DC

## 2013-07-08 MED ORDER — BETHANECHOL CHLORIDE 50 MG PO TABS: 50.0000 mg | ORAL_TABLET | Freq: Four times a day (QID) | ORAL | Status: DC

## 2013-07-08 MED ORDER — TAMSULOSIN HCL 0.4 MG PO CAPS: 0.4000 mg | ORAL_CAPSULE | Freq: Every day | ORAL | Status: DC

## 2013-07-08 NOTE — Discharge Summary (Signed)
Physician Discharge Summary  Patient ID: [name] MRN: [id number] DOB/AGE: December 27, 1940 72 y.o.  Admit date: 06/22/2013 Discharge date: 07/08/2013  Discharge Diagnoses:  Active Problems:   Cauda equina syndrome   Discharged Condition: Improved.   Significant Diagnostic Studies:  Labs:  Basic Metabolic Panel:    Component Value Date/Time   NA 138 06/30/2013 1050   K 4.2 06/30/2013 1050   CL 101 06/30/2013 1050   CO2 25 06/30/2013 1050   GLUCOSE 91 06/30/2013 1050   BUN 24* 06/30/2013 1050   CREATININE 1.22 07/06/2013 0440   CALCIUM 9.0 06/30/2013 1050   GFRNONAA 58* 07/06/2013 0440   GFRAA 68* 07/06/2013 0440      CBC:    Component Value Date/Time   WBC 8.4 06/24/2013 0527   RBC 3.65* 06/24/2013 0527   HGB 11.2* 06/24/2013 0527   HCT 33.0* 06/24/2013 0527   PLT 271 06/24/2013 0527   MCV 90.4 06/24/2013 0527   MCH 30.7 06/24/2013 0527   MCHC 33.9 06/24/2013 0527   RDW 14.4 06/24/2013 0527   LYMPHSABS 1.5 06/24/2013 0527   MONOABS 0.9 06/24/2013 0527   EOSABS 0.3 06/24/2013 0527   BASOSABS 0.0 06/24/2013 0527      CBG: No results found for this basename: GLUCAP,  in the last 168 hours  Brief HPI:   [name] is a 72 y.o. male with history of CAD, HTN, SBO, Lumbar decompression 2/14 with left foot drop, RLE pain with radiation to the knee and falls due to severe weakness of ileo-psoas as well as quadriceps muscle. Patient also with history of RTC limiting RUE as well as partial resection of sternum due mediastinitis. 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 Jeral Fruit. Post op on with headaches and on bedrest for minimal CSF leak till 06/20/13. RLE weakness decreasing with decrease in back pain. Therapies initiated and patient with poor posture with inability to activate BLE due to sensory deficits as well as incontinent of bladder and bowel (lack of sensation  reported). He was admitted to [facility] for progressive therapies.   Hospital Course: [name] was admitted to rehab 06/22/2013 for inpatient therapies to consist of PT, ST and OT at least three hours five days a week. Past admission physiatrist, therapy team and rehab RN have worked together to provide customized collaborative inpatient rehab. Back incision has been healing well without signs and symptoms of infection and minimal edema at proximal aspect. Blood pressures have been well controlled. Lytes reveal mild renal insufficiency that is stable.  He was started on bowel program with daily suppositories to help with evacuation and prevent accidents. Enterococcus UTI was treated with 7 day course of amoxicillin. Urecholine was added to flomax and patient is having minimal urine output. He continues to require cath qid and has been educated on in and out catheterization.   Po intake has been good and pain has been well controlled on prn meds. Mood has been stable with positive outlook. At present, motor strength is at 3 minus right deltoid and left deltoid, 4/5 bilateral bicep triceps and grip 4- hip flexors,4- bilateral knee extensors and right ankle dorsiflexor and plantar flexor, 0/5 left ankle dorsiflexor and plantar flexor. Right ankle 1 + to 2/5. Decreased sensation S1 and L5 dermatomes on left and right. Patient has made great progress and is modified independent at wheelchair level. He will continue  to receive home health PT, OT and RN per [facility].    Rehab course: During patient's stay in rehab weekly team conferences were held to monitor patient's progress, set goals and discuss barriers to discharge. He's modified independent for transfers. He is able to ambulate 40 feet with min assist. He is able to complete ADL tasks independently with assistive device. Family eduction was done with wife regarding all aspect of mobility. She was educated on bumping him up for stair navigation as  ramp has not been built.   Disposition: 01-Home or Self Care  Diet: Regular.   Special Instructions: 1. No bending, twisting or arching. No lifting andy thing over 5 lbs.  2. Cath four times a day--5-6 am, noon, 5 pm and 11 pm. Keep volumes less thant 350-400 cc.    Future Appointments Provider Department Dept Phone   08/15/2013 11:40 AM Ranelle Oyster, MD [facility] [phone]   11/03/2013 8:30 AM Vickki Hearing, MD [facility] [phone]       Medication List         atorvastatin 40 MG tablet  Commonly known as:  LIPITOR  Take 1 tablet (40 mg total) by mouth daily.     bethanechol 50 MG tablet  Commonly known as:  URECHOLINE  Take 1 tablet (50 mg total) by mouth 4 (four) times daily.     citalopram 40 MG tablet  Commonly known as:  CELEXA  Take 40 mg by mouth daily.     doxazosin 4 MG tablet  Commonly known as:  CARDURA  Take 8 mg by mouth at bedtime.     gabapentin 300 MG capsule  Commonly known as:  NEURONTIN  Take 1 capsule (300 mg total) by mouth 3 (three) times daily.     nitroGLYCERIN 0.4 MG SL tablet  Commonly known as:  NITROSTAT  Place 0.4 mg under the tongue every 5 (five) minutes as needed. For chest pain     oxyCODONE 15 MG immediate release tablet  Commonly known as:  ROXICODONE  Take 7.5 mg by mouth every 4 (four) hours as needed. For pain     predniSONE 10 MG tablet  Commonly known as:  DELTASONE  Take 1 tablet (10 mg total) by mouth daily.     tamsulosin 0.4 MG Caps capsule  Commonly known as:  FLOMAX  Take 1 capsule (0.4 mg total) by mouth daily after breakfast.     VITAMIN B 12 PO  Take 1 tablet by mouth daily.     vitamin C 500 MG tablet  Commonly known as:  ASCORBIC ACID  Take 500 mg by mouth daily.     VITAMIN D (CHOLECALCIFEROL) PO  Take 2-3 tablets by mouth daily.     VITAMIN E PO  Take 2-3 tablets by mouth daily.           Follow-up Information    Follow up with Ranelle Oyster, MD On 08/15/2013. (Be there at 11:20 am  for 11:40 am  appointment)    Specialty:  Physical Medicine and Rehabilitation   Contact information:   [address], [address] [place] [address] [phone]       Follow up with Karn Cassis, MD. Call today. (for follow up in 3-4 weeks. )    Specialty:  Neurosurgery   Contact information:   [address] [place] [address] [phone]  Follow up with Fredirick Maudlin, MD On 07/26/2013. (appt: 11;30 am)    Specialty:  Pulmonary Disease   Contact information:   [address] [place] [address] [phone]       Signed: Jacquelynn Cree 07/08/2013, 3:32 PM

## 2013-07-08 NOTE — Progress Notes (Signed)
Subjective/Complaints: Excited to go home. No new issues today. Asks what exercises he can do to strengthen his "butt" Review of Systems -otherwise negative  Objective: Vital Signs: Blood pressure 125/65, pulse 75, temperature 97.7 F (36.5 C), temperature source Oral, resp. rate 18, height 6' (1.829 m), weight 83.915 kg (185 lb), SpO2 98.00%. No results found. Results for orders placed during the hospital encounter of 06/22/13 (from the past 72 hour(s))  CREATININE, SERUM     Status: Abnormal   Collection Time    07/06/13  4:40 AM      Result Value Range   Creatinine, Ser 1.22  0.50 - 1.35 mg/dL   GFR calc non Af Amer 58 (*) >90 mL/min   GFR calc Af Amer 68 (*) >90 mL/min   Comment: (NOTE)     The eGFR has been calculated using the CKD EPI equation.     This calculation has not been validated in all clinical situations.     eGFR's persistently <90 mL/min signify possible Chronic Kidney     Disease.        Constitutional: He is oriented to person, place, and time. He appears well-developed and well-nourished.  HENT:  Head: Normocephalic and atraumatic.  Mouth/Throat: Oropharynx is clear and moist.  Eyes: Conjunctivae are normal. Pupils are equal, round, and reactive to light.   Cardiovascular: Normal rate and regular rhythm.  Respiratory: Effort normal and breath sounds normal. No respiratory distress. He has no wheezes.  Skin: Midline lumbar incision small amount of serous drainage on 4 x 4, no bleeding, no wound dehiscence GI: Soft. Bowel sounds are normal. He exhibits no distension. There is no tenderness.  Large midline incision with large incision hernia  Musculoskeletal: He exhibits no edema. Mild tenderness over left pecmajor Neurological: He is alert and oriented to person, place, and time.  Speech clear. Follows commands without difficulty.   Decreased S1 and L5 dermatomes sensationLeft and right. Motor strength is 3 minus right deltoid for at left deltoid 4/5  bilateral bicep triceps and grip 4- hip flexors,4- bilateral knee extensors and right ankle dorsiflexor and plantar flexor 0/5 left ankle dorsiflexor and plantar flexor. Right ankle 1 + to 2/5.  Skin: Skin is warm and dry. Surgical site healed with scar tissue noted with some bunching of skin in this area. There is NO fluid collection.   Assessment/Plan: 1. Functional deficits secondary to cauda equina syndrome which require 3+ hours per day of interdisciplinary therapy in a comprehensive inpatient rehab setting. Physiatrist is providing close team supervision and 24 hour management of active medical problems listed below. Physiatrist and rehab team continue to assess barriers to discharge/monitor patient progress toward functional and medical goals.    FIM: FIM - Bathing Bathing Steps Patient Completed: Chest;Right Arm;Left Arm;Abdomen;Front perineal area;Buttocks;Right upper leg;Left upper leg;Right lower leg (including foot);Left lower leg (including foot) Bathing: 6: Assistive device (Comment)  FIM - Upper Body Dressing/Undressing Upper body dressing/undressing steps patient completed: Thread/unthread right sleeve of pullover shirt/dresss;Thread/unthread left sleeve of pullover shirt/dress;Put head through opening of pull over shirt/dress;Pull shirt over trunk Upper body dressing/undressing: 7: Complete Independence: No helper FIM - Lower Body Dressing/Undressing Lower body dressing/undressing steps patient completed: Thread/unthread right pants leg;Thread/unthread left pants leg;Pull pants up/down;Fasten/unfasten pants;Don/Doff right sock;Don/Doff left sock;Don/Doff right shoe;Don/Doff left shoe;Fasten/unfasten right shoe;Fasten/unfasten left shoe Lower body dressing/undressing: 6: Assistive device (Comment)  FIM - Toileting Toileting steps completed by patient: Adjust clothing prior to toileting;Performs perineal hygiene;Adjust clothing after toileting Toileting Assistive Devices: Grab  bar or rail for support Toileting: 6: Assistive device: No helper  FIM - Diplomatic Services operational officer Devices: Grab bars;Elevated toilet seat Toilet Transfers: 6-Assistive device: No helper  FIM - Banker Devices: Arm rests Bed/Chair Transfer: 6: Assistive device: no helper  FIM - Locomotion: Wheelchair Locomotion: Wheelchair: 6: Travels 150 ft or more, turns around, maneuvers to table, bed or toilet, negotiates 3% grade: maneuvers on rugs and over door sills independently FIM - Locomotion: Ambulation Locomotion: Ambulation Assistive Devices: Walker - Rolling;Orthosis Ambulation/Gait Assistance: 4: Min assist Locomotion: Ambulation: 1: Travels less than 50 ft with minimal assistance (Pt.>75%)  Comprehension Comprehension Mode: Auditory Comprehension: 7-Follows complex conversation/direction: With no assist  Expression Expression Mode: Verbal Expression: 7-Expresses complex ideas: With no assist  Social Interaction Social Interaction: 7-Interacts appropriately with others - No medications needed.  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  1. DVT Prophylaxis/Anticoagulation: Pharmaceutical: Lovenox  2. Chronic pain/Pain Management: Has been on steroids X 1 year for pain management--willing to attempt taper during hospitalization. Continue neurontin for neuropathy. Prn oxcodone for pain effective.  3. Anxiety disorder/ Mood: Provide ego support as has lot of anxiety about current condition. Continue celexa. LCSW to follow for evaluation.  4. Neuropsych: This patient is capable of making decisions on his own behalf.  5. HTN: controlled. Continue Cardura.  6. Neurogenic B/B:   bowel program with suppository daily.    - doxasosin 8mg  and  flomax 4mg --continue at home  -continue urechoine 50mg   Qid continue this at home  -up to EOB to void or to toilet, double voids, massage--   -enterococcus in urine---amoxil started Friday--dc today  -pt able to self- cath--i reviewed goals and he should strive for cath volumes between 350 and 500cc   7. DJD/DDD: On chronic steroids for skin condition and pain-- dose redues     LOS (Days) 16 A FACE TO FACE EVALUATION WAS PERFORMED  [name] T 07/08/2013, 9:20 AM

## 2013-07-08 NOTE — Progress Notes (Signed)
Social Work Discharge Note Discharge Note  The overall goal for the admission was met for:   Discharge location: Yes-HOME WITH WIFE AND FAMILY ASSISTING WITH HIS CARE  Length of Stay: Yes-16 DAYS  Discharge activity level: Yes-SUPERVISION/MIN LEVEL  Home/community participation: Yes  Services provided included: MD, RD, PT, OT, RN, CM, Pharmacy and SW  Financial Services: Medicare and Private Insurance: [employer]  Follow-up services arranged: [facility]-PT,OT,RN AND [facility]-WHEELCHAIR, BSC, TUB BENCH, ROLLING WALKER  Comments (or additional information):FAMILY TRAINING COMPLETED, FEEL READY FOR DISCHARGE  Patient/Family verbalized understanding of follow-up arrangements: Yes  Individual responsible for coordination of the follow-up plan: SELF & PEGGY-WIFE  Confirmed correct DME delivered: Lucy Chris 07/08/2013    Lucy Chris

## 2013-07-08 NOTE — Progress Notes (Signed)
Patient ID: [name], male   DOB: 09-24-1940, 71 y.o.   MRN: [id number] Going home today. To have pt/ot as outpatient.i will see him in my office in 3 to 4 weeks.

## 2013-07-08 NOTE — Progress Notes (Signed)
Recreational Therapy Discharge Summary Patient Details  Name: [name] MRN: [id number] Date of Birth: [birth year] Today's Date: 07/08/2013  Long term goals set: 2  Long term goals met: 2  Comments on progress toward goals: Pt has made great progress toward goals and is discharging home today at overall Mod I w/c level.  Education provided on overall safety, energy conservation, & community reintegration.   Reasons for discharge: discharge from hospital  Patient/family agrees with progress made and goals achieved: Yes  [name] 07/08/2013, 8:41 AM

## 2013-07-09 DIAGNOSIS — S50909A Unspecified superficial injury of unspecified elbow, initial encounter: Secondary | ICD-10-CM | POA: Diagnosis not present

## 2013-07-09 DIAGNOSIS — I2510 Atherosclerotic heart disease of native coronary artery without angina pectoris: Secondary | ICD-10-CM | POA: Diagnosis not present

## 2013-07-09 DIAGNOSIS — Z4789 Encounter for other orthopedic aftercare: Secondary | ICD-10-CM | POA: Diagnosis not present

## 2013-07-09 DIAGNOSIS — N319 Neuromuscular dysfunction of bladder, unspecified: Secondary | ICD-10-CM | POA: Diagnosis not present

## 2013-07-09 DIAGNOSIS — I10 Essential (primary) hypertension: Secondary | ICD-10-CM | POA: Diagnosis not present

## 2013-07-09 DIAGNOSIS — Z936 Other artificial openings of urinary tract status: Secondary | ICD-10-CM | POA: Diagnosis not present

## 2013-07-09 DIAGNOSIS — S60929A Unspecified superficial injury of unspecified hand, initial encounter: Secondary | ICD-10-CM | POA: Diagnosis not present

## 2013-07-10 DIAGNOSIS — S50909A Unspecified superficial injury of unspecified elbow, initial encounter: Secondary | ICD-10-CM | POA: Diagnosis not present

## 2013-07-10 DIAGNOSIS — Z4789 Encounter for other orthopedic aftercare: Secondary | ICD-10-CM | POA: Diagnosis not present

## 2013-07-10 DIAGNOSIS — S60929A Unspecified superficial injury of unspecified hand, initial encounter: Secondary | ICD-10-CM | POA: Diagnosis not present

## 2013-07-10 DIAGNOSIS — N319 Neuromuscular dysfunction of bladder, unspecified: Secondary | ICD-10-CM | POA: Diagnosis not present

## 2013-07-10 DIAGNOSIS — I10 Essential (primary) hypertension: Secondary | ICD-10-CM | POA: Diagnosis not present

## 2013-07-10 DIAGNOSIS — I2510 Atherosclerotic heart disease of native coronary artery without angina pectoris: Secondary | ICD-10-CM | POA: Diagnosis not present

## 2013-07-11 ENCOUNTER — Telehealth: Payer: Self-pay

## 2013-07-11 DIAGNOSIS — S50909A Unspecified superficial injury of unspecified elbow, initial encounter: Secondary | ICD-10-CM | POA: Diagnosis not present

## 2013-07-11 DIAGNOSIS — I10 Essential (primary) hypertension: Secondary | ICD-10-CM | POA: Diagnosis not present

## 2013-07-11 DIAGNOSIS — N319 Neuromuscular dysfunction of bladder, unspecified: Secondary | ICD-10-CM | POA: Diagnosis not present

## 2013-07-11 DIAGNOSIS — Z4789 Encounter for other orthopedic aftercare: Secondary | ICD-10-CM | POA: Diagnosis not present

## 2013-07-11 DIAGNOSIS — S60929A Unspecified superficial injury of unspecified hand, initial encounter: Secondary | ICD-10-CM | POA: Diagnosis not present

## 2013-07-11 DIAGNOSIS — I2510 Atherosclerotic heart disease of native coronary artery without angina pectoris: Secondary | ICD-10-CM | POA: Diagnosis not present

## 2013-07-11 NOTE — Telephone Encounter (Signed)
Debbie a physical therapist with [facility] called to get verbal to approve therapy.  She wants to see him 4 times a week then gradually decrease visits.  Please advise.

## 2013-07-12 NOTE — Telephone Encounter (Signed)
Yes that is fine. Please proceed

## 2013-07-12 NOTE — Progress Notes (Signed)
Social Work  Discharge Note  The overall goal for the admission was met for:   Discharge location: Yes - home with wife to provide 24/7 assistance  Length of Stay: Yes - 16 days  Discharge activity level: Yes - minimal assist overall  Home/community participation: Yes  Services provided included: MD, RD, PT, OT, RN, TR, Pharmacy and SW  Financial Services: Medicare and Private Insurance: [employer]  Follow-up services arranged: Home Health: RN, PT, OT via [facility], DME: 18x18 lightweight w/c, cushion, rolling walker, 3n1 commode and tub bench via [facility] and Patient/Family has no preference for HH/DME agencies  Comments (or additional information):  Patient/Family verbalized understanding of follow-up arrangements: Yes  Individual responsible for coordination of the follow-up plan: patient  Confirmed correct DME delivered: Monquie Fulgham 07/12/2013    [employer]

## 2013-07-13 DIAGNOSIS — S60929A Unspecified superficial injury of unspecified hand, initial encounter: Secondary | ICD-10-CM | POA: Diagnosis not present

## 2013-07-13 DIAGNOSIS — I2510 Atherosclerotic heart disease of native coronary artery without angina pectoris: Secondary | ICD-10-CM | POA: Diagnosis not present

## 2013-07-13 DIAGNOSIS — Z4789 Encounter for other orthopedic aftercare: Secondary | ICD-10-CM | POA: Diagnosis not present

## 2013-07-13 DIAGNOSIS — S50909A Unspecified superficial injury of unspecified elbow, initial encounter: Secondary | ICD-10-CM | POA: Diagnosis not present

## 2013-07-13 DIAGNOSIS — I10 Essential (primary) hypertension: Secondary | ICD-10-CM | POA: Diagnosis not present

## 2013-07-13 DIAGNOSIS — N319 Neuromuscular dysfunction of bladder, unspecified: Secondary | ICD-10-CM | POA: Diagnosis not present

## 2013-07-13 NOTE — Telephone Encounter (Signed)
Left a voicemail for Debbie at [facility] to give a verbal order for PT.

## 2013-07-14 DIAGNOSIS — S60929A Unspecified superficial injury of unspecified hand, initial encounter: Secondary | ICD-10-CM | POA: Diagnosis not present

## 2013-07-14 DIAGNOSIS — Z4789 Encounter for other orthopedic aftercare: Secondary | ICD-10-CM | POA: Diagnosis not present

## 2013-07-14 DIAGNOSIS — I2510 Atherosclerotic heart disease of native coronary artery without angina pectoris: Secondary | ICD-10-CM | POA: Diagnosis not present

## 2013-07-14 DIAGNOSIS — S50909A Unspecified superficial injury of unspecified elbow, initial encounter: Secondary | ICD-10-CM | POA: Diagnosis not present

## 2013-07-14 DIAGNOSIS — I10 Essential (primary) hypertension: Secondary | ICD-10-CM | POA: Diagnosis not present

## 2013-07-14 DIAGNOSIS — N319 Neuromuscular dysfunction of bladder, unspecified: Secondary | ICD-10-CM | POA: Diagnosis not present

## 2013-07-15 DIAGNOSIS — I10 Essential (primary) hypertension: Secondary | ICD-10-CM | POA: Diagnosis not present

## 2013-07-15 DIAGNOSIS — S50909A Unspecified superficial injury of unspecified elbow, initial encounter: Secondary | ICD-10-CM | POA: Diagnosis not present

## 2013-07-15 DIAGNOSIS — Z4789 Encounter for other orthopedic aftercare: Secondary | ICD-10-CM | POA: Diagnosis not present

## 2013-07-15 DIAGNOSIS — I2510 Atherosclerotic heart disease of native coronary artery without angina pectoris: Secondary | ICD-10-CM | POA: Diagnosis not present

## 2013-07-15 DIAGNOSIS — N319 Neuromuscular dysfunction of bladder, unspecified: Secondary | ICD-10-CM | POA: Diagnosis not present

## 2013-07-15 DIAGNOSIS — S60929A Unspecified superficial injury of unspecified hand, initial encounter: Secondary | ICD-10-CM | POA: Diagnosis not present

## 2013-07-18 DIAGNOSIS — I2510 Atherosclerotic heart disease of native coronary artery without angina pectoris: Secondary | ICD-10-CM | POA: Diagnosis not present

## 2013-07-18 DIAGNOSIS — I10 Essential (primary) hypertension: Secondary | ICD-10-CM | POA: Diagnosis not present

## 2013-07-18 DIAGNOSIS — N319 Neuromuscular dysfunction of bladder, unspecified: Secondary | ICD-10-CM | POA: Diagnosis not present

## 2013-07-18 DIAGNOSIS — S60929A Unspecified superficial injury of unspecified hand, initial encounter: Secondary | ICD-10-CM | POA: Diagnosis not present

## 2013-07-18 DIAGNOSIS — S50909A Unspecified superficial injury of unspecified elbow, initial encounter: Secondary | ICD-10-CM | POA: Diagnosis not present

## 2013-07-18 DIAGNOSIS — Z4789 Encounter for other orthopedic aftercare: Secondary | ICD-10-CM | POA: Diagnosis not present

## 2013-07-19 DIAGNOSIS — I2510 Atherosclerotic heart disease of native coronary artery without angina pectoris: Secondary | ICD-10-CM | POA: Diagnosis not present

## 2013-07-19 DIAGNOSIS — I10 Essential (primary) hypertension: Secondary | ICD-10-CM | POA: Diagnosis not present

## 2013-07-19 DIAGNOSIS — N319 Neuromuscular dysfunction of bladder, unspecified: Secondary | ICD-10-CM | POA: Diagnosis not present

## 2013-07-19 DIAGNOSIS — S60929A Unspecified superficial injury of unspecified hand, initial encounter: Secondary | ICD-10-CM | POA: Diagnosis not present

## 2013-07-19 DIAGNOSIS — Z4789 Encounter for other orthopedic aftercare: Secondary | ICD-10-CM | POA: Diagnosis not present

## 2013-07-19 DIAGNOSIS — S50909A Unspecified superficial injury of unspecified elbow, initial encounter: Secondary | ICD-10-CM | POA: Diagnosis not present

## 2013-07-20 DIAGNOSIS — S60929A Unspecified superficial injury of unspecified hand, initial encounter: Secondary | ICD-10-CM | POA: Diagnosis not present

## 2013-07-20 DIAGNOSIS — Z4789 Encounter for other orthopedic aftercare: Secondary | ICD-10-CM | POA: Diagnosis not present

## 2013-07-20 DIAGNOSIS — I10 Essential (primary) hypertension: Secondary | ICD-10-CM | POA: Diagnosis not present

## 2013-07-20 DIAGNOSIS — N319 Neuromuscular dysfunction of bladder, unspecified: Secondary | ICD-10-CM | POA: Diagnosis not present

## 2013-07-20 DIAGNOSIS — S50909A Unspecified superficial injury of unspecified elbow, initial encounter: Secondary | ICD-10-CM | POA: Diagnosis not present

## 2013-07-20 DIAGNOSIS — I2510 Atherosclerotic heart disease of native coronary artery without angina pectoris: Secondary | ICD-10-CM | POA: Diagnosis not present

## 2013-07-22 DIAGNOSIS — I2510 Atherosclerotic heart disease of native coronary artery without angina pectoris: Secondary | ICD-10-CM | POA: Diagnosis not present

## 2013-07-22 DIAGNOSIS — I10 Essential (primary) hypertension: Secondary | ICD-10-CM | POA: Diagnosis not present

## 2013-07-22 DIAGNOSIS — R300 Dysuria: Secondary | ICD-10-CM | POA: Diagnosis not present

## 2013-07-22 DIAGNOSIS — Z4789 Encounter for other orthopedic aftercare: Secondary | ICD-10-CM | POA: Diagnosis not present

## 2013-07-22 DIAGNOSIS — S50909A Unspecified superficial injury of unspecified elbow, initial encounter: Secondary | ICD-10-CM | POA: Diagnosis not present

## 2013-07-22 DIAGNOSIS — S60929A Unspecified superficial injury of unspecified hand, initial encounter: Secondary | ICD-10-CM | POA: Diagnosis not present

## 2013-07-22 DIAGNOSIS — N319 Neuromuscular dysfunction of bladder, unspecified: Secondary | ICD-10-CM | POA: Diagnosis not present

## 2013-07-25 DIAGNOSIS — I2510 Atherosclerotic heart disease of native coronary artery without angina pectoris: Secondary | ICD-10-CM | POA: Diagnosis not present

## 2013-07-25 DIAGNOSIS — S50909A Unspecified superficial injury of unspecified elbow, initial encounter: Secondary | ICD-10-CM | POA: Diagnosis not present

## 2013-07-25 DIAGNOSIS — Z4789 Encounter for other orthopedic aftercare: Secondary | ICD-10-CM | POA: Diagnosis not present

## 2013-07-25 DIAGNOSIS — S60929A Unspecified superficial injury of unspecified hand, initial encounter: Secondary | ICD-10-CM | POA: Diagnosis not present

## 2013-07-25 DIAGNOSIS — N319 Neuromuscular dysfunction of bladder, unspecified: Secondary | ICD-10-CM | POA: Diagnosis not present

## 2013-07-25 DIAGNOSIS — I10 Essential (primary) hypertension: Secondary | ICD-10-CM | POA: Diagnosis not present

## 2013-07-26 DIAGNOSIS — I2510 Atherosclerotic heart disease of native coronary artery without angina pectoris: Secondary | ICD-10-CM | POA: Diagnosis not present

## 2013-07-26 DIAGNOSIS — I10 Essential (primary) hypertension: Secondary | ICD-10-CM | POA: Diagnosis not present

## 2013-07-26 DIAGNOSIS — N390 Urinary tract infection, site not specified: Secondary | ICD-10-CM | POA: Diagnosis not present

## 2013-07-26 DIAGNOSIS — S60929A Unspecified superficial injury of unspecified hand, initial encounter: Secondary | ICD-10-CM | POA: Diagnosis not present

## 2013-07-26 DIAGNOSIS — S50909A Unspecified superficial injury of unspecified elbow, initial encounter: Secondary | ICD-10-CM | POA: Diagnosis not present

## 2013-07-26 DIAGNOSIS — Z4789 Encounter for other orthopedic aftercare: Secondary | ICD-10-CM | POA: Diagnosis not present

## 2013-07-26 DIAGNOSIS — N319 Neuromuscular dysfunction of bladder, unspecified: Secondary | ICD-10-CM | POA: Diagnosis not present

## 2013-07-26 DIAGNOSIS — I259 Chronic ischemic heart disease, unspecified: Secondary | ICD-10-CM | POA: Diagnosis not present

## 2013-07-27 DIAGNOSIS — N319 Neuromuscular dysfunction of bladder, unspecified: Secondary | ICD-10-CM | POA: Diagnosis not present

## 2013-07-27 DIAGNOSIS — S60929A Unspecified superficial injury of unspecified hand, initial encounter: Secondary | ICD-10-CM | POA: Diagnosis not present

## 2013-07-27 DIAGNOSIS — I10 Essential (primary) hypertension: Secondary | ICD-10-CM | POA: Diagnosis not present

## 2013-07-27 DIAGNOSIS — I2510 Atherosclerotic heart disease of native coronary artery without angina pectoris: Secondary | ICD-10-CM | POA: Diagnosis not present

## 2013-07-27 DIAGNOSIS — S50909A Unspecified superficial injury of unspecified elbow, initial encounter: Secondary | ICD-10-CM | POA: Diagnosis not present

## 2013-07-27 DIAGNOSIS — Z4789 Encounter for other orthopedic aftercare: Secondary | ICD-10-CM | POA: Diagnosis not present

## 2013-07-28 DIAGNOSIS — Z4789 Encounter for other orthopedic aftercare: Secondary | ICD-10-CM | POA: Diagnosis not present

## 2013-07-28 DIAGNOSIS — S50909A Unspecified superficial injury of unspecified elbow, initial encounter: Secondary | ICD-10-CM | POA: Diagnosis not present

## 2013-07-28 DIAGNOSIS — I10 Essential (primary) hypertension: Secondary | ICD-10-CM | POA: Diagnosis not present

## 2013-07-28 DIAGNOSIS — I2510 Atherosclerotic heart disease of native coronary artery without angina pectoris: Secondary | ICD-10-CM | POA: Diagnosis not present

## 2013-07-28 DIAGNOSIS — N319 Neuromuscular dysfunction of bladder, unspecified: Secondary | ICD-10-CM | POA: Diagnosis not present

## 2013-07-28 DIAGNOSIS — S60929A Unspecified superficial injury of unspecified hand, initial encounter: Secondary | ICD-10-CM | POA: Diagnosis not present

## 2013-07-29 ENCOUNTER — Encounter (HOSPITAL_COMMUNITY): Payer: Self-pay | Admitting: Emergency Medicine

## 2013-07-29 ENCOUNTER — Emergency Department (HOSPITAL_COMMUNITY): Payer: Medicare Other

## 2013-07-29 ENCOUNTER — Inpatient Hospital Stay (HOSPITAL_COMMUNITY)
Admission: EM | Admit: 2013-07-29 | Discharge: 2013-08-08 | DRG: 856 | Disposition: A | Discharge status: Rehabilitation Facility | Payer: Medicare Other | Attending: Neurosurgery | Admitting: Neurosurgery

## 2013-07-29 DIAGNOSIS — G062 Extradural and subdural abscess, unspecified: Secondary | ICD-10-CM | POA: Diagnosis not present

## 2013-07-29 DIAGNOSIS — R109 Unspecified abdominal pain: Secondary | ICD-10-CM | POA: Diagnosis not present

## 2013-07-29 DIAGNOSIS — M5136 Other intervertebral disc degeneration, lumbar region: Secondary | ICD-10-CM | POA: Diagnosis present

## 2013-07-29 DIAGNOSIS — R197 Diarrhea, unspecified: Secondary | ICD-10-CM | POA: Diagnosis not present

## 2013-07-29 DIAGNOSIS — Z87891 Personal history of nicotine dependence: Secondary | ICD-10-CM

## 2013-07-29 DIAGNOSIS — M5137 Other intervertebral disc degeneration, lumbosacral region: Secondary | ICD-10-CM | POA: Diagnosis not present

## 2013-07-29 DIAGNOSIS — Z8249 Family history of ischemic heart disease and other diseases of the circulatory system: Secondary | ICD-10-CM | POA: Diagnosis not present

## 2013-07-29 DIAGNOSIS — E785 Hyperlipidemia, unspecified: Secondary | ICD-10-CM | POA: Diagnosis present

## 2013-07-29 DIAGNOSIS — M79609 Pain in unspecified limb: Secondary | ICD-10-CM | POA: Diagnosis not present

## 2013-07-29 DIAGNOSIS — I10 Essential (primary) hypertension: Secondary | ICD-10-CM | POA: Diagnosis present

## 2013-07-29 DIAGNOSIS — K592 Neurogenic bowel, not elsewhere classified: Secondary | ICD-10-CM | POA: Diagnosis not present

## 2013-07-29 DIAGNOSIS — M51379 Other intervertebral disc degeneration, lumbosacral region without mention of lumbar back pain or lower extremity pain: Secondary | ICD-10-CM | POA: Diagnosis present

## 2013-07-29 DIAGNOSIS — G8918 Other acute postprocedural pain: Secondary | ICD-10-CM | POA: Diagnosis not present

## 2013-07-29 DIAGNOSIS — G96198 Other disorders of meninges, not elsewhere classified: Secondary | ICD-10-CM | POA: Diagnosis present

## 2013-07-29 DIAGNOSIS — D649 Anemia, unspecified: Secondary | ICD-10-CM | POA: Diagnosis not present

## 2013-07-29 DIAGNOSIS — G834 Cauda equina syndrome: Secondary | ICD-10-CM | POA: Diagnosis present

## 2013-07-29 DIAGNOSIS — T8140XA Infection following a procedure, unspecified, initial encounter: Principal | ICD-10-CM | POA: Diagnosis present

## 2013-07-29 DIAGNOSIS — Z9861 Coronary angioplasty status: Secondary | ICD-10-CM

## 2013-07-29 DIAGNOSIS — M67919 Unspecified disorder of synovium and tendon, unspecified shoulder: Secondary | ICD-10-CM | POA: Diagnosis not present

## 2013-07-29 DIAGNOSIS — E871 Hypo-osmolality and hyponatremia: Secondary | ICD-10-CM | POA: Diagnosis present

## 2013-07-29 DIAGNOSIS — G9619 Other disorders of meninges, not elsewhere classified: Secondary | ICD-10-CM

## 2013-07-29 DIAGNOSIS — R911 Solitary pulmonary nodule: Secondary | ICD-10-CM | POA: Diagnosis not present

## 2013-07-29 DIAGNOSIS — Z981 Arthrodesis status: Secondary | ICD-10-CM

## 2013-07-29 DIAGNOSIS — G061 Intraspinal abscess and granuloma: Secondary | ICD-10-CM | POA: Diagnosis not present

## 2013-07-29 DIAGNOSIS — R509 Fever, unspecified: Secondary | ICD-10-CM | POA: Diagnosis not present

## 2013-07-29 DIAGNOSIS — M719 Bursopathy, unspecified: Secondary | ICD-10-CM | POA: Diagnosis not present

## 2013-07-29 DIAGNOSIS — N390 Urinary tract infection, site not specified: Secondary | ICD-10-CM | POA: Diagnosis not present

## 2013-07-29 DIAGNOSIS — M519 Unspecified thoracic, thoracolumbar and lumbosacral intervertebral disc disorder: Secondary | ICD-10-CM | POA: Diagnosis not present

## 2013-07-29 DIAGNOSIS — M4716 Other spondylosis with myelopathy, lumbar region: Secondary | ICD-10-CM | POA: Diagnosis not present

## 2013-07-29 DIAGNOSIS — M7989 Other specified soft tissue disorders: Secondary | ICD-10-CM | POA: Diagnosis not present

## 2013-07-29 DIAGNOSIS — T814XXA Infection following a procedure, initial encounter: Secondary | ICD-10-CM

## 2013-07-29 DIAGNOSIS — Z951 Presence of aortocoronary bypass graft: Secondary | ICD-10-CM

## 2013-07-29 DIAGNOSIS — M8708 Idiopathic aseptic necrosis of bone, other site: Secondary | ICD-10-CM | POA: Diagnosis not present

## 2013-07-29 DIAGNOSIS — Z5189 Encounter for other specified aftercare: Secondary | ICD-10-CM | POA: Diagnosis not present

## 2013-07-29 DIAGNOSIS — N319 Neuromuscular dysfunction of bladder, unspecified: Secondary | ICD-10-CM | POA: Diagnosis not present

## 2013-07-29 DIAGNOSIS — M545 Low back pain, unspecified: Secondary | ICD-10-CM | POA: Diagnosis not present

## 2013-07-29 DIAGNOSIS — G8220 Paraplegia, unspecified: Secondary | ICD-10-CM | POA: Diagnosis present

## 2013-07-29 DIAGNOSIS — IMO0002 Reserved for concepts with insufficient information to code with codable children: Secondary | ICD-10-CM | POA: Diagnosis not present

## 2013-07-29 DIAGNOSIS — Z7401 Bed confinement status: Secondary | ICD-10-CM | POA: Diagnosis not present

## 2013-07-29 DIAGNOSIS — M2550 Pain in unspecified joint: Secondary | ICD-10-CM | POA: Diagnosis not present

## 2013-07-29 DIAGNOSIS — M48061 Spinal stenosis, lumbar region without neurogenic claudication: Secondary | ICD-10-CM | POA: Diagnosis not present

## 2013-07-29 DIAGNOSIS — R0989 Other specified symptoms and signs involving the circulatory and respiratory systems: Secondary | ICD-10-CM | POA: Diagnosis not present

## 2013-07-29 LAB — URINALYSIS, ROUTINE W REFLEX MICROSCOPIC
Bilirubin Urine: NEGATIVE
GLUCOSE, UA: NEGATIVE mg/dL
HGB URINE DIPSTICK: NEGATIVE
Ketones, ur: NEGATIVE mg/dL
LEUKOCYTES UA: NEGATIVE
Nitrite: NEGATIVE
PROTEIN: NEGATIVE mg/dL
Specific Gravity, Urine: 1.01 (ref 1.005–1.030)
Urobilinogen, UA: 0.2 mg/dL (ref 0.0–1.0)
pH: 6 (ref 5.0–8.0)

## 2013-07-29 LAB — CBC WITH DIFFERENTIAL/PLATELET
BASOS ABS: 0 10*3/uL (ref 0.0–0.1)
Basophils Relative: 0 % (ref 0–1)
EOS PCT: 4 % (ref 0–5)
Eosinophils Absolute: 0.3 10*3/uL (ref 0.0–0.7)
HEMATOCRIT: 29.9 % — AB (ref 39.0–52.0)
Hemoglobin: 10.2 g/dL — ABNORMAL LOW (ref 13.0–17.0)
LYMPHS ABS: 1.2 10*3/uL (ref 0.7–4.0)
LYMPHS PCT: 14 % (ref 12–46)
MCH: 30.1 pg (ref 26.0–34.0)
MCHC: 34.1 g/dL (ref 30.0–36.0)
MCV: 88.2 fL (ref 78.0–100.0)
MONOS PCT: 13 % — AB (ref 3–12)
Monocytes Absolute: 1.1 10*3/uL — ABNORMAL HIGH (ref 0.1–1.0)
NEUTROS ABS: 5.8 10*3/uL (ref 1.7–7.7)
Neutrophils Relative %: 69 % (ref 43–77)
Platelets: 230 10*3/uL (ref 150–400)
RBC: 3.39 MIL/uL — AB (ref 4.22–5.81)
RDW: 14.2 % (ref 11.5–15.5)
WBC: 8.4 10*3/uL (ref 4.0–10.5)

## 2013-07-29 LAB — COMPREHENSIVE METABOLIC PANEL
ALT: 11 U/L (ref 0–53)
AST: 16 U/L (ref 0–37)
Albumin: 3.1 g/dL — ABNORMAL LOW (ref 3.5–5.2)
Alkaline Phosphatase: 67 U/L (ref 39–117)
BILIRUBIN TOTAL: 0.5 mg/dL (ref 0.3–1.2)
BUN: 19 mg/dL (ref 6–23)
CHLORIDE: 96 meq/L (ref 96–112)
CO2: 26 meq/L (ref 19–32)
Calcium: 9.2 mg/dL (ref 8.4–10.5)
Creatinine, Ser: 1.45 mg/dL — ABNORMAL HIGH (ref 0.50–1.35)
GFR calc non Af Amer: 47 mL/min — ABNORMAL LOW (ref >90)
GFR, EST AFRICAN AMERICAN: 55 mL/min — AB (ref >90)
GLUCOSE: 109 mg/dL — AB (ref 70–99)
Potassium: 4.2 mEq/L (ref 3.7–5.3)
Sodium: 130 mEq/L — ABNORMAL LOW (ref 137–147)
Total Protein: 5.6 g/dL — ABNORMAL LOW (ref 6.0–8.3)

## 2013-07-29 MED ORDER — GADOBENATE DIMEGLUMINE 529 MG/ML IV SOLN
19.0000 mL | Freq: Once | INTRAVENOUS | Status: AC | PRN
  Administered 2013-07-29: 19 mL via INTRAVENOUS

## 2013-07-29 MED ORDER — HYDROMORPHONE HCL PF 1 MG/ML IJ SOLN
1.0000 mg | Freq: Once | INTRAMUSCULAR | Status: AC
  Administered 2013-07-29: 1 mg via INTRAVENOUS
  Filled 2013-07-29: qty 1

## 2013-07-29 MED ORDER — HYDROMORPHONE HCL PF 1 MG/ML IJ SOLN
INTRAMUSCULAR | Status: AC
Start: 2013-07-29 — End: 2013-07-30
  Filled 2013-07-29: qty 1

## 2013-07-29 MED ORDER — SODIUM CHLORIDE 0.9 % IV SOLN
INTRAVENOUS | Status: DC
  Administered 2013-07-29: 1000 mL via INTRAVENOUS

## 2013-07-29 MED ORDER — HYDROMORPHONE HCL PF 1 MG/ML IJ SOLN
1.0000 mg | Freq: Once | INTRAMUSCULAR | Status: AC
  Administered 2013-07-29: 1 mg via INTRAVENOUS

## 2013-07-29 NOTE — ED Notes (Signed)
Pt caths himself to urinate. States he ran out of caths and has been boiling them and states he may have a UTI. States pain to groin area. Recent back surgery, states back pain is ongoing since surgery but is worse over the past few days.

## 2013-07-29 NOTE — ED Notes (Signed)
Pt now c/o of new pain in back of neck. Also has chill. Repeat temp done. Medicated as noted

## 2013-07-29 NOTE — ED Provider Notes (Signed)
CSN: [id number]     Arrival date & time 07/29/13  1659 History  This chart was scribed for Ward Givens, MD by Bennett Scrape, ED Scribe. This patient was seen in room APA11/APA11 and the patient's care was started at 6:44 PM.    Chief Complaint  Patient presents with  . Dysuria    The history is provided by the patient and the spouse. No language interpreter was used.    HPI Comments: [name] is a 73 y.o. male who presents to the Emergency Department complaining of lower back pain that started yesterday. He describes the pain as sharp, dull and achy which is aggravated with movement. He denies any changes in pain with deep breathing. He reports a 99 fever at home and chills that started today He admits that he has had recent "disc" surgery performed by Dr. Phineas Semen. He states that since the surgery his back pain had been improved. He denies that this pain is similar to the prior chronic back pain. He denies any nausea, emesis, cough.  or diarrhea. 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 He states that he self caths and reports that the urine has appeared cloudy for the past week. He has f/u with [name] and was told that it was due to an infection. He was prescribed one antibiotic started last week and had it changed 4 days ago due to the urine culture results. Pt states that he is still on the changed antibiotic and feels worse since the change. He called his home health nurse requesting that he be changed back, but nurse refused. He does not know the names of either antibiotic. Pt thinks he has a UTI still.   He denies smoking and alcohol use. He has not been around anybody else is ill. Patient states he has had staph infections in the past.  PCP Dr Juanetta Gosling Neurosurgeon Dr Jeral Fruit  Past Medical History  Diagnosis Date  . Hyperlipidemia   . Small bowel  problem     HAD ALOT OF SCAR TISSUE FROM PREVIOUS SURGERIES..NG WAS INSERTED ...Marland KitchenMarland KitchenNO SURGERY NEEDED...Marland KitchenMarland KitchenIN FOR 8 DAYS  . Disorder of blood     BEEN TREATED BY DERMATOLOGIST X 4 YRS..."BLOOD BLISTERS"  . Arthritis   . Gout   . Anxiety   . Hx MRSA infection     rt shoulder  . Pneumonia     "I've had it 3-4 times"  . Coronary artery disease     IN 2000   STENT PLACED IN 2012 sees Dr. Dietrich Pates, saw last 2013  . HTN (hypertension)     sees [name] in [place]  . Small bowel obstruction   . Ventral hernia   . Acute renal failure 04/17/2013  . 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 Sternal surg  2000    HAS HAD 5-6 ON HIS STERNUM; "caught MRSA in it"  . Lumbar laminectomy/decompression microdiscectomy  06/13/2011    Procedure: LUMBAR LAMINECTOMY/DECOMPRESSION MICRODISCECTOMY;  Surgeon: Karn Cassis;  Location: [facility];  Service: Neurosurgery;  Laterality: N/A;  Lumbar three, lumbar four-five Laminectomy  . Eye surgery      bilateral cataract  . Anterior cervical corpectomy  12/17/11  . Incision and drainage of wound  ~ 20ll; 12/03/11    "  had infection in my right"  . Shoulder open rotator cuff repair  ~ 2011    right  . Peripherally inserted central catheter insertion  2011 & 11/2011  . Cataract extraction w/ intraocular lens  implant, bilateral  ? 2011  . Coronary artery bypass graft  2000    CABG X5  . Back surgery      lumbar  . Tonsillectomy  1949  . Cholecystectomy  2006 "or after"  . Coronary angioplasty with stent placement  2012  . Anterior cervical corpectomy  12/17/2011    Procedure: ANTERIOR CERVICAL CORPECTOMY;  Surgeon: Karn Cassis, MD;  Location: [facility];  Service: Neurosurgery;  Laterality: N/A;  Anterior Cervical Decompression Fusion Five to Thoracic Two with plating  . Lumbar laminectomy/decompression microdiscectomy Right 06/17/2013    Procedure: LUMBAR  LAMINECTOMY/DECOMPRESSION MICRODISCECTOMY 1 LEVEL;  Surgeon: Karn Cassis, MD;  Location: [facility];  Service: Neurosurgery;  Laterality: Right;  Right L3-4 Microdiskectomy   Family History  Problem Relation Age of Onset  . Heart disease    . Hypertension Mother   . Hypertension Maternal Aunt   . Hypertension Maternal Uncle   . Anesthesia problems Neg Hx   . Hypotension Neg Hx   . Malignant hyperthermia Neg Hx   . Pseudochol deficiency Neg Hx    History  Substance Use Topics  . Smoking status: Former Smoker -- 1.00 packs/day for 1 years    Types: Cigarettes    Quit date: 12/27/1998  . Smokeless tobacco: Never Used  . Alcohol Use: No  lives at home Lives with spouse  Review of Systems  Constitutional: Positive for fever and chills.  Respiratory: Negative for cough.   Gastrointestinal: Negative for nausea, vomiting and abdominal pain.  Genitourinary: Negative for dysuria and urgency.  Musculoskeletal: Positive for back pain.  All other systems reviewed and are negative.    Allergies  Morphine and Metoprolol  Home Medications   Current Outpatient Rx  Name  Route  Sig  Dispense  Refill  . atorvastatin (LIPITOR) 40 MG tablet   Oral   Take 1 tablet (40 mg total) by mouth daily.   90 tablet   2   . bethanechol (URECHOLINE) 50 MG tablet   Oral   Take 1 tablet (50 mg total) by mouth 4 (four) times daily.   120 tablet   1   . citalopram (CELEXA) 40 MG tablet   Oral   Take 40 mg by mouth daily.         . Cyanocobalamin (VITAMIN B 12 PO)   Oral   Take 1 tablet by mouth daily.         Marland Kitchen doxazosin (CARDURA) 4 MG tablet   Oral   Take 8 mg by mouth at bedtime.         . gabapentin (NEURONTIN) 300 MG capsule   Oral   Take 1 capsule (300 mg total) by mouth 3 (three) times daily.   90 capsule   1   . oxyCODONE (ROXICODONE) 15 MG immediate release tablet   Oral   Take 7.5 mg by mouth every 4 (four) hours as needed. For pain         . predniSONE  (DELTASONE) 5 MG tablet   Oral   Take 5 mg by mouth daily.         . tamsulosin (FLOMAX) 0.4 MG CAPS capsule   Oral   Take 1 capsule (0.4 mg total) by mouth daily after  breakfast.   30 capsule   1   . vitamin C (ASCORBIC ACID) 500 MG tablet   Oral   Take 500 mg by mouth daily.         Marland Kitchen VITAMIN D, CHOLECALCIFEROL, PO   Oral   Take 2-3 tablets by mouth daily.         Marland Kitchen VITAMIN E PO   Oral   Take 2-3 tablets by mouth daily.         . nitroGLYCERIN (NITROSTAT) 0.4 MG SL tablet   Sublingual   Place 0.4 mg under the tongue every 5 (five) minutes as needed. For chest pain          Triage Vitals: BP 129/70  Pulse 64  Temp(Src) 99 F (37.2 C) (Oral)  Resp 16  Ht 6' (1.829 m)  Wt 200 lb (90.719 kg)  BMI 27.12 kg/m2  SpO2 100%  Vital signs normal    Physical Exam  Nursing note and vitals reviewed. Constitutional: He is oriented to person, place, and time. He appears well-developed and well-nourished.  Non-toxic appearance. He does not appear ill. No distress.  HENT:  Head: Normocephalic and atraumatic.  Right Ear: External ear normal.  Left Ear: External ear normal.  Nose: Nose normal. No mucosal edema or rhinorrhea.  Mouth/Throat: Oropharynx is clear and moist and mucous membranes are normal. No dental abscesses or uvula swelling.  Eyes: Conjunctivae and EOM are normal. Pupils are equal, round, and reactive to light.  Neck: Normal range of motion and full passive range of motion without pain. Neck supple.  Cardiovascular: Normal rate, regular rhythm and normal heart sounds.  Exam reveals no gallop and no friction rub.   No murmur heard. Pulmonary/Chest: Effort normal and breath sounds normal. No respiratory distress. He has no wheezes. He has no rhonchi. He has no rales. He exhibits no tenderness and no crepitus.  Abdominal: Soft. Normal appearance and bowel sounds are normal. He exhibits no distension. There is no tenderness. There is no rebound and no  guarding.  Musculoskeletal: He exhibits tenderness. He exhibits no edema.       Back:   Well-healed surgical scar to the midline shown in black. Tender to the lower lumbar sacral area. Nontender to the lumbar spine above the surgical site or in the thoracic spine.   Neurological: He is alert and oriented to person, place, and time. He has normal strength. No cranial nerve deficit.  Skin: Skin is warm, dry and intact. No rash noted. No erythema. No pallor.  Psychiatric: He has a normal mood and affect. His speech is normal and behavior is normal. His mood appears not anxious.    ED Course  Procedures (including critical care time)  DIAGNOSTIC STUDIES: Oxygen Saturation is 100% on RA, normal by my interpretation.    COORDINATION OF CARE: 6:51 PM-Informed pt and family of normal UA. Discussed treatment plan which includes with pt at bedside and pt agreed to plan.   23:00 radiologist called MR results--pt has one epidural abscess and 3 loculated areas in the operative bed c/w abscesses  23:22 Dr Dub Amis, neurosurgery, will look at his MR and call me back  23:38 Dr Dub Amis, pt had spinal fluid leakage after his surgery, and possible meningocoele will admit tonight and have Dr Jeral Fruit see in am.   Pt give results of his scan and he is agreeable to admission.   Results for orders placed during the hospital encounter of 07/29/13  URINALYSIS, ROUTINE W  REFLEX MICROSCOPIC      Result Value Range   Color, Urine YELLOW  YELLOW   APPearance CLEAR  CLEAR   Specific Gravity, Urine 1.010  1.005 - 1.030   pH 6.0  5.0 - 8.0   Glucose, UA NEGATIVE  NEGATIVE mg/dL   Hgb urine dipstick NEGATIVE  NEGATIVE   Bilirubin Urine NEGATIVE  NEGATIVE   Ketones, ur NEGATIVE  NEGATIVE mg/dL   Protein, ur NEGATIVE  NEGATIVE mg/dL   Urobilinogen, UA 0.2  0.0 - 1.0 mg/dL   Nitrite NEGATIVE  NEGATIVE   Leukocytes, UA NEGATIVE  NEGATIVE  CBC WITH DIFFERENTIAL      Result Value Range   WBC 8.4  4.0 - 10.5 K/uL    RBC 3.39 (*) 4.22 - 5.81 MIL/uL   Hemoglobin 10.2 (*) 13.0 - 17.0 g/dL   HCT 16.1 (*) 09.6 - 04.5 %   MCV 88.2  78.0 - 100.0 fL   MCH 30.1  26.0 - 34.0 pg   MCHC 34.1  30.0 - 36.0 g/dL   RDW 40.9  81.1 - 91.4 %   Platelets 230  150 - 400 K/uL   Neutrophils Relative % 69  43 - 77 %   Neutro Abs 5.8  1.7 - 7.7 K/uL   Lymphocytes Relative 14  12 - 46 %   Lymphs Abs 1.2  0.7 - 4.0 K/uL   Monocytes Relative 13 (*) 3 - 12 %   Monocytes Absolute 1.1 (*) 0.1 - 1.0 K/uL   Eosinophils Relative 4  0 - 5 %   Eosinophils Absolute 0.3  0.0 - 0.7 K/uL   Basophils Relative 0  0 - 1 %   Basophils Absolute 0.0  0.0 - 0.1 K/uL  COMPREHENSIVE METABOLIC PANEL      Result Value Range   Sodium 130 (*) 137 - 147 mEq/L   Potassium 4.2  3.7 - 5.3 mEq/L   Chloride 96  96 - 112 mEq/L   CO2 26  19 - 32 mEq/L   Glucose, Bld 109 (*) 70 - 99 mg/dL   BUN 19  6 - 23 mg/dL   Creatinine, Ser 7.82 (*) 0.50 - 1.35 mg/dL   Calcium 9.2  8.4 - 95.6 mg/dL   Total Protein 5.6 (*) 6.0 - 8.3 g/dL   Albumin 3.1 (*) 3.5 - 5.2 g/dL   AST 16  0 - 37 U/L   ALT 11  0 - 53 U/L   Alkaline Phosphatase 67  39 - 117 U/L   Total Bilirubin 0.5  0.3 - 1.2 mg/dL   GFR calc non Af Amer 47 (*) >90 mL/min   GFR calc Af Amer 55 (*) >90 mL/min   Laboratory interpretation all normal except hyponatremia   Mr Lumbar Spine W Wo Contrast  07/29/2013   CLINICAL DATA:  Back pain with low-grade fever. History of recent spinal surgery and  EXAM: MRI LUMBAR SPINE WITHOUT AND WITH CONTRAST  TECHNIQUE: Multiplanar and multiecho pulse sequences of the lumbar spine were obtained without and with intravenous contrast.  CONTRAST:  19mL MULTIHANCE GADOBENATE DIMEGLUMINE 529 MG/ML IV SOLN  COMPARISON:  Prior radiograph from 06/17/2013  FINDINGS: For the purposes of this dictation, the lowest well-formed intervertebral disc spaces presumed to be the L5-S1 level, and there presumed to be 5 lumbar type vertebral bodies.  Postoperative changes from recent  posterior spinal decompression with fusion are seen at the L4 thru S1 levels. Bilateral transpedicular screws are seen at these levels with intervertebral  disc spacer at L4-5. The pedicular screws at the L4 level are angulated cephalad and closely approximate the L4 superior endplate, with question of encroachment upon the L3-4 intervertebral disc space (series 3, image 4). Mild retropulsion of the intervertebral spacer device at L4-5 is grossly stable as compared to the prior exam. The grade 1 anterior listhesis of L4 on L5 is stable. Vertebral bodies are otherwise normally aligned.  Hyperintense STIR signal is seen within the L4-5 and L5-S1 disc spaces, which may be related to prior surgery. Signal intensity within the vertebral body bone marrow is within normal limits. Signal intensity within the visualized spinal cord is normal. The conus terminates at L1.  Heterogeneous signal intensity is seen throughout the posterior spinous soft tissues of the lower back, consistent with recent surgery. There is enhancement of the nerve roots of the cauda equina.  At T12-L1 and L1-2, there is no disc bulge or disc protrusion. No significant facet arthrosis. No canal or neural foraminal stenosis.  At L2-3, there is degenerative disc desiccation with diffuse disc bulge and moderate bilateral facet hypertrophy. Mild canal stenosis without significant foraminal narrowing is stable as compared to prior exam.  At L3-4, sequelae of prior decompressive right hemi laminectomy are seen. A loculated T2 hyperintense, T1 hypo intense peripherally enhancing collection that measures 1.5 x 0.9 x 1.9 cm is seen within the epidural space along the right aspect of the thecal sac (series 5, image 22). There is secondary mass effect with leftward Next item postoperative changes displacement of the thecal sac and resultant moderate canal stenosis. A 2nd T2 hyperintense, T1 hypointense peripherally enhancing collection located slightly more  posteriorly and inferiorly at the laminectomy site measures 1.4 x 0.9 x 1.7 cm (series 5, image 24). There is abnormal enhancement throughout the epidural space at this level. Third loculated fluid collection located slightly more superiorly just to the right of midline measures 1.2 x 0.7 cm (series 9, image 20) Moderate right foraminal stenosis is present. No definite recurrent disc herniation.  At L4-5, sequelae of prior bilateral hemi laminectomy are seen. There is abnormal enhancing soft tissue with heterogeneous enhancing fluid collections present within the laminectomy bed. The largest of these collections is located just to the right of midline and measures 0.9 x 1.1 x 1.5 cm (series 9, image 33). A 2nd collection located at the medial aspect of the left transpedicular screw measures approximately 1.2 x 1.1 cm (series 9, image 33). There is mild canal in bilateral foraminal stenosis.  At L5-S1, sequelae of prior decompressive laminectomy are seen. There is mild enhancement within the soft tissues in the laminectomy bed without frank fluid collection. No canal stenosis. Diffuse disc bulge and disc desiccation is similar as compared to the prior exam. Moderate bilateral foraminal stenosis is unchanged.  Visualized visceral structures are within normal limits. The bladder is distended.  IMPRESSION: 1. 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 2. Additional loculated abscesses within the laminectomy site at L3-4 and L4-5 as detailed above. 3. Postoperative changes from recent posterior spinal decompression with fusion and microdiskectomy at L4 thru S1. The bilateral transpedicular screws at L4 are angulated cephalad and closely approximate the superior endplate of L4, with question of possible encroachment upon the L3-4 intervertebral disc space. Correlation with plain film radiography would be helpful for further evaluation  of this as clinically desired.  Critical Value/emergent results were called by telephone at the  time of interpretation on 07/29/2013 at 10:59 PM to Dr. Devoria Albe , who verbally acknowledged these results.   Electronically Signed   By: Rise Mu M.D.   On: 07/29/2013 23:14     EKG Interpretation   None       MDM   1. Spinal epidural abscess   2. Post-operative infection, initial encounter     Plan admission, transfer to [facility]  Devoria Albe, MD, FACEP   I personally performed the services described in this documentation, which was scribed in my presence. The recorded information has been reviewed and considered.  Devoria Albe, MD, Armando Gang     Ward Givens, MD 07/29/13 [phone]

## 2013-07-30 ENCOUNTER — Encounter (HOSPITAL_COMMUNITY): Payer: Self-pay | Admitting: Neurological Surgery

## 2013-07-30 DIAGNOSIS — Z7401 Bed confinement status: Secondary | ICD-10-CM | POA: Diagnosis not present

## 2013-07-30 DIAGNOSIS — M2550 Pain in unspecified joint: Secondary | ICD-10-CM | POA: Diagnosis not present

## 2013-07-30 LAB — SEDIMENTATION RATE: Sed Rate: 23 mm/hr — ABNORMAL HIGH (ref 0–16)

## 2013-07-30 LAB — C-REACTIVE PROTEIN: CRP: 2.4 mg/dL — ABNORMAL HIGH (ref <0.60)

## 2013-07-30 MED ORDER — RIFAMPIN 300 MG PO CAPS
300.0000 mg | ORAL_CAPSULE | Freq: Two times a day (BID) | ORAL | Status: DC
  Administered 2013-07-30 – 2013-08-08 (×18): 300 mg via ORAL
  Filled 2013-07-30 (×20): qty 1

## 2013-07-30 MED ORDER — GABAPENTIN 300 MG PO CAPS
300.0000 mg | ORAL_CAPSULE | Freq: Three times a day (TID) | ORAL | Status: DC
  Administered 2013-07-30 – 2013-08-04 (×15): 300 mg via ORAL
  Filled 2013-07-30 (×18): qty 1

## 2013-07-30 MED ORDER — DIAZEPAM 5 MG PO TABS
5.0000 mg | ORAL_TABLET | Freq: Four times a day (QID) | ORAL | Status: DC | PRN
  Administered 2013-07-30 – 2013-08-01 (×3): 5 mg via ORAL
  Filled 2013-07-30 (×3): qty 1

## 2013-07-30 MED ORDER — DOXAZOSIN MESYLATE 8 MG PO TABS
8.0000 mg | ORAL_TABLET | Freq: Every day | ORAL | Status: DC
  Administered 2013-07-30 – 2013-08-07 (×9): 8 mg via ORAL
  Filled 2013-07-30 (×11): qty 1

## 2013-07-30 MED ORDER — BETHANECHOL CHLORIDE 25 MG PO TABS
50.0000 mg | ORAL_TABLET | Freq: Four times a day (QID) | ORAL | Status: DC
  Administered 2013-07-30 – 2013-08-08 (×35): 50 mg via ORAL
  Filled 2013-07-30 (×41): qty 2

## 2013-07-30 MED ORDER — OXYCODONE HCL 5 MG PO TABS
10.0000 mg | ORAL_TABLET | ORAL | Status: DC | PRN
  Administered 2013-08-03 – 2013-08-08 (×16): 10 mg via ORAL
  Filled 2013-07-30 (×16): qty 2

## 2013-07-30 MED ORDER — HYDROMORPHONE HCL PF 1 MG/ML IJ SOLN: 1.0000 mg | INTRAMUSCULAR | Status: DC | PRN

## 2013-07-30 MED ORDER — SODIUM CHLORIDE 0.9 % IV SOLN: INTRAVENOUS | Status: DC

## 2013-07-30 MED ORDER — VITAMIN C 500 MG PO TABS
500.0000 mg | ORAL_TABLET | Freq: Every day | ORAL | Status: DC
  Administered 2013-07-30 – 2013-08-08 (×9): 500 mg via ORAL
  Filled 2013-07-30 (×10): qty 1

## 2013-07-30 MED ORDER — CITALOPRAM HYDROBROMIDE 40 MG PO TABS
40.0000 mg | ORAL_TABLET | Freq: Every day | ORAL | Status: DC
  Administered 2013-07-30 – 2013-08-08 (×9): 40 mg via ORAL
  Filled 2013-07-30 (×10): qty 1

## 2013-07-30 MED ORDER — DEXTROSE 5 % IV SOLN
1.0000 g | Freq: Two times a day (BID) | INTRAVENOUS | Status: DC
  Administered 2013-07-30 – 2013-07-31 (×4): 1 g via INTRAVENOUS
  Filled 2013-07-30 (×8): qty 10

## 2013-07-30 MED ORDER — HYDROMORPHONE 0.3 MG/ML IV SOLN
INTRAVENOUS | Status: DC
  Administered 2013-07-30: 05:00:00 via INTRAVENOUS
  Administered 2013-07-30: 1.8 mg via INTRAVENOUS
  Administered 2013-07-30: 0.6 mg via INTRAVENOUS
  Administered 2013-07-30: 0.3 mg via INTRAVENOUS
  Administered 2013-07-30: 1.8 mg via INTRAVENOUS
  Administered 2013-07-31: 09:00:00 via INTRAVENOUS
  Administered 2013-07-31: 2.1 mg via INTRAVENOUS
  Administered 2013-07-31 (×2): 0.6 mg via INTRAVENOUS
  Administered 2013-07-31: 0.3 mg via INTRAVENOUS
  Administered 2013-08-01: 17:00:00 via INTRAVENOUS
  Administered 2013-08-01: 0.6 mg via INTRAVENOUS
  Administered 2013-08-01: 1.2 mg via INTRAVENOUS
  Administered 2013-08-02 (×2): via INTRAVENOUS
  Administered 2013-08-02 (×2): 2.34 mg via INTRAVENOUS
  Administered 2013-08-02: 1.96 mg via INTRAVENOUS
  Administered 2013-08-02: 1.8 mg via INTRAVENOUS
  Administered 2013-08-03: 4.5 mg via INTRAVENOUS
  Administered 2013-08-03: 05:00:00 via INTRAVENOUS
  Administered 2013-08-03: 2.4 mg via INTRAVENOUS
  Administered 2013-08-03: 7 mg via INTRAVENOUS
  Filled 2013-07-30 (×6): qty 25

## 2013-07-30 MED ORDER — NITROGLYCERIN 0.4 MG SL SUBL: 0.4000 mg | SUBLINGUAL_TABLET | SUBLINGUAL | Status: DC | PRN

## 2013-07-30 MED ORDER — TAMSULOSIN HCL 0.4 MG PO CAPS
0.4000 mg | ORAL_CAPSULE | Freq: Every day | ORAL | Status: DC
  Administered 2013-07-30 – 2013-08-08 (×9): 0.4 mg via ORAL
  Filled 2013-07-30 (×12): qty 1

## 2013-07-30 MED ORDER — ONDANSETRON HCL 4 MG/2ML IJ SOLN: 4.0000 mg | Freq: Three times a day (TID) | INTRAMUSCULAR | Status: AC | PRN

## 2013-07-30 MED ORDER — POTASSIUM CHLORIDE IN NACL 20-0.9 MEQ/L-% IV SOLN
INTRAVENOUS | Status: DC
  Administered 2013-07-30 – 2013-08-06 (×7): via INTRAVENOUS
  Filled 2013-07-30 (×21): qty 1000

## 2013-07-30 MED ORDER — VANCOMYCIN HCL 10 G IV SOLR
1250.0000 mg | INTRAVENOUS | Status: AC
  Administered 2013-07-30: 1250 mg via INTRAVENOUS
  Filled 2013-07-30: qty 1250

## 2013-07-30 MED ORDER — VANCOMYCIN HCL IN DEXTROSE 750-5 MG/150ML-% IV SOLN
750.0000 mg | Freq: Two times a day (BID) | INTRAVENOUS | Status: DC
  Administered 2013-07-30 – 2013-08-01 (×4): 750 mg via INTRAVENOUS
  Filled 2013-07-30 (×5): qty 150

## 2013-07-30 NOTE — Progress Notes (Signed)
ANTIBIOTIC CONSULT NOTE - INITIAL  Pharmacy Consult for Rocephin/Vanco Indication: Lumbar wound  Allergies  Allergen Reactions  . Morphine Other (See Comments)    REACTION: made him go crazy; "I had fun w/it; my family didn't think it was too funny"  . Metoprolol Other (See Comments)    REACTION:  Tachycardia; "don't remember how bad it was; it was so long ago"    Patient Measurements: Height: 6' (182.9 cm) Weight: 179 lb 14.4 oz (81.602 kg) IBW/kg (Calculated) : 77.6 Adjusted Body Weight:    Vital Signs: Temp: 98.1 F (36.7 C) (01/03 0548) Temp src: Oral (01/03 0548) BP: 150/48 mmHg (01/03 0548) Pulse Rate: 77 (01/03 0548) Intake/Output from previous day: 01/02 0701 - 01/03 0700 In: 120 [P.O.:120] Out: 1550 [Urine:1550] Intake/Output from this shift:    Labs:  Recent Labs  07/29/13 1929  WBC 8.4  HGB 10.2*  PLT 230  CREATININE 1.45*   Estimated Creatinine Clearance: 52 ml/min (by C-G formula based on Cr of 1.45). No results found for this basename: VANCOTROUGH, VANCOPEAK, VANCORANDOM, GENTTROUGH, GENTPEAK, GENTRANDOM, TOBRATROUGH, TOBRAPEAK, TOBRARND, AMIKACINPEAK, AMIKACINTROU, AMIKACIN,  in the last 72 hours   Microbiology: No results found for this or any previous visit (from the past 720 hour(s)).  Medical History: Past Medical History  Diagnosis Date  . Hyperlipidemia   . Small bowel problem     HAD ALOT OF SCAR TISSUE FROM PREVIOUS SURGERIES..NG WAS INSERTED ...Marland Kitchen.Marland Kitchen.NO SURGERY NEEDED...Marland Kitchen.Marland Kitchen.IN FOR 8 DAYS  . Disorder of blood     BEEN TREATED BY DERMATOLOGIST X 4 YRS..."BLOOD BLISTERS"  . Arthritis   . Gout   . Anxiety   . Hx MRSA infection     rt shoulder  . Pneumonia     "I've had it 3-4 times"  . Coronary artery disease     IN 2000   STENT PLACED IN 2012 sees Dr. Dietrich Patesothbart, saw last 2013  . HTN (hypertension)     se[place] Dr. Juanetta GoslingHawkins in [place]  . Small bowel obstruction   . Ventral hernia   . Acute renal failure 04/17/2013  . AVN (avascular  necrosis of bone)     bilateral hips    Medications:  Prescriptions prior to admission  Medication Sig Dispense Refill  . atorvastatin (LIPITOR) 40 MG tablet Take 1 tablet (40 mg total) by mouth daily.  90 tablet  2  . bethanechol (URECHOLINE) 50 MG tablet Take 1 tablet (50 mg total) by mouth 4 (four) times daily.  120 tablet  1  . citalopram (CELEXA) 40 MG tablet Take 40 mg by mouth daily.      . Cyanocobalamin (VITAMIN B 12 PO) Take 1 tablet by mouth daily.      Marland Kitchen. doxazosin (CARDURA) 4 MG tablet Take 8 mg by mouth at bedtime.      . gabapentin (NEURONTIN) 300 MG capsule Take 1 capsule (300 mg total) by mouth 3 (three) times daily.  90 capsule  1  . oxyCODONE (ROXICODONE) 15 MG immediate release tablet Take 7.5 mg by mouth every 4 (four) hours as needed. For pain      . predniSONE (DELTASONE) 5 MG tablet Take 5 mg by mouth daily.      . tamsulosin (FLOMAX) 0.4 MG CAPS capsule Take 1 capsule (0.4 mg total) by mouth daily after breakfast.  30 capsule  1  . vitamin C (ASCORBIC ACID) 500 MG tablet Take 500 mg by mouth daily.      Marland Kitchen. VITAMIN D, CHOLECALCIFEROL, PO Take 2-3  tablets by mouth daily.      Marland Kitchen VITAMIN E PO Take 2-3 tablets by mouth daily.      . nitroGLYCERIN (NITROSTAT) 0.4 MG SL tablet Place 0.4 mg under the tongue every 5 (five) minutes as needed. For chest pain       Assessment: 73 y/o M with recent back surgery 6 weeks ago presents with pain in the groin area, low back and dysuria. Patient caths himself and thought he may have a UTI.  07/29/13 MRI:1. 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 2. Additional loculated abscesses within the laminectomy site at L3-4 and L4-5 as detailed above.   Goal of Therapy:  Vancomycin trough level 15-20 mcg/ml  Plan:  Rocephin 1g IV q12 hrs Vancomycin 1250mg  IV x 1 then 750mg  IV q12hrs Check Vancomycin trough after 3-5 doses at steady state.   Bron Snellings  S. Merilynn Finland, PharmD, BCPS Clinical Staff Pharmacist Pager [phone]  Misty Stanley Stillinger 07/30/2013,9:28 AM

## 2013-07-30 NOTE — H&P (Signed)
Reason for Consult:back pain Referring Physician: EDP  [name] is an 73 y.o. male.   HPI:  This is a 72 year old male who was transferred from any pain hospital for further evaluation and management of back pain. He underwent a decompression and fusion by Dr. Jeral Fruit about 6 weeks ago. He suffered a unintended durotomy at the time of surgery and also suffered from severe weakness of the lower extremities after the surgery as best I can tell from the previous notes. He was admitted to rehabilitation and then discharged about 3 weeks ago. About 3 or 4 days ago he began to complain of severe low back pain without obvious trauma or injury to the back. He does describe some low-grade fevers. He describes headache and some neck pain. The pain is very severe and unrelenting. It has failed to respond to narcotic analgesics. He presented to the emergency department where an MRI with and without contrast suggested epidural fluid collection and some small ring-enhancing fluid collections in the soft tissues. His white count was normal he was afebrile at the emergency department. He does describe pain into the legs. He has pain at rest but also has increased pain with any movement. He is catheterized himself because of bladder issues after surgery, he is being treated with oral antibiotics for a presumed UTI by his PCP. I don't know what antibiotic they are using. Apparently his antibiotic was changed after his urine cultures came back with an organism not covered by the first antibiotic. This history is obtained by the EDP.  Past Medical History  Diagnosis Date  . Hyperlipidemia   . Small bowel problem     HAD ALOT OF SCAR TISSUE FROM PREVIOUS SURGERIES..NG WAS INSERTED ...Marland KitchenMarland KitchenNO SURGERY NEEDED...Marland KitchenMarland KitchenIN FOR 8 DAYS  . Disorder of blood     BEEN TREATED BY DERMATOLOGIST X 4 YRS..."BLOOD BLISTERS"  . Arthritis   . Gout   . Anxiety   . Hx MRSA infection     rt shoulder  . Pneumonia     "I've had it 3-4  times"  . Coronary artery disease     IN 2000   STENT PLACED IN 2012 sees Dr. Dietrich Pates, saw last 2013  . HTN (hypertension)     sees [name] in [place]  . Small bowel obstruction   . Ventral hernia   . Acute renal failure 04/17/2013  . 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 Sternal surg  2000    HAS HAD 5-6 ON HIS STERNUM; "caught MRSA in it"  . Lumbar laminectomy/decompression microdiscectomy  06/13/2011    Procedure: LUMBAR LAMINECTOMY/DECOMPRESSION MICRODISCECTOMY;  Surgeon: Karn Cassis;  Location: [facility];  Service: Neurosurgery;  Laterality: N/A;  Lumbar three, lumbar four-five Laminectomy  . Eye surgery      bilateral cataract  . Anterior cervical corpectomy  12/17/11  . Incision and drainage of wound  ~ 20ll; 12/03/11    "had infection in my right"  . Shoulder open rotator cuff repair  ~ 2011    right  . Peripherally inserted central catheter insertion  2011 & 11/2011  . Cataract extraction w/ intraocular lens  implant, bilateral  ? 2011  . Coronary artery bypass graft  2000    CABG X5  . Back surgery      lumbar  . Tonsillectomy  1949  . Cholecystectomy  2006 "or after"  . Coronary angioplasty  with stent placement  2012  . Anterior cervical corpectomy  12/17/2011    Procedure: ANTERIOR CERVICAL CORPECTOMY;  Surgeon: Karn Cassis, MD;  Location: [facility];  Service: Neurosurgery;  Laterality: N/A;  Anterior Cervical Decompression Fusion Five to Thoracic Two with plating  . Lumbar laminectomy/decompression microdiscectomy Right 06/17/2013    Procedure: LUMBAR LAMINECTOMY/DECOMPRESSION MICRODISCECTOMY 1 LEVEL;  Surgeon: Karn Cassis, MD;  Location: [facility];  Service: Neurosurgery;  Laterality: Right;  Right L3-4 Microdiskectomy    Allergies  Allergen Reactions  . Morphine Other (See Comments)    REACTION: made him go crazy; "I had fun w/it; my family didn't think it was too  funny"  . Metoprolol Other (See Comments)    REACTION:  Tachycardia; "don't remember how bad it was; it was so long ago"    History  Substance Use Topics  . Smoking status: Former Smoker -- 1.00 packs/day for 1 years    Types: Cigarettes    Quit date: 12/27/1998  . Smokeless tobacco: Never Used  . Alcohol Use: No    Family History  Problem Relation Age of Onset  . Heart disease    . Hypertension Mother   . Hypertension Maternal Aunt   . Hypertension Maternal Uncle   . Anesthesia problems Neg Hx   . Hypotension Neg Hx   . Malignant hyperthermia Neg Hx   . Pseudochol deficiency Neg Hx      Review of Systems  Positive ROS: As above  All other systems have been reviewed and were otherwise negative with the exception of those mentioned in the HPI and as above.  Objective: Vital signs in last 24 hours: Temp:  [97.9 F (36.6 C)-100.9 F (38.3 C)] 98.1 F (36.7 C) (01/03 0548) Pulse Rate:  [64-77] 77 (01/03 0548) Resp:  [16-25] 20 (01/03 0800) BP: (111-159)/(48-70) 150/48 mmHg (01/03 0548) SpO2:  [97 %-100 %] 97 % (01/03 0800) FiO2 (%):  [70 %] 70 % (01/03 0800) Weight:  [81.602 kg (179 lb 14.4 oz)-90.719 kg (200 lb)] 81.602 kg (179 lb 14.4 oz) (01/03 0246)  General Appearance: Alert, cooperative, in obvious discomfort and distress but nontoxic, appears stated age Head: Normocephalic, without obvious abnormality, atraumatic Eyes: PERRL, conjunctiva/corneas clear, EOM's intact      Neck: Seems to have some nuchal rigidity Back: Symmetric, no curvature, pain with rolling in bed and some tenderness, his incision is well-healed and there is no redness or swelling or induration or leakage or drainage Lungs: respirations unlabored Heart: Regular rate and rhythm Abdomen: Soft Extremities: Extremities show some edema  NEUROLOGIC:   Mental status: A&O x4, no aphasia, some decrease in attention span because of pain, Memory and fund of knowledge are difficult to assess because of  pain Motor Exam - grossly normal in the upper extremities but 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 He can wiggle his toes in dorsi flex and plantar flexors right foot he can lift neither her leg all the bed, though admittedly he is limited by pain also Sensory Exam - grossly normal Reflexes: symmetric and hypoactive Coordination - unable to test Gait - unable to test Balance - unable to test Cranial Nerves: I: smell Not tested  II: visual acuity  OS: na    OD: na  II: visual fields Full to confrontation  II: pupils Equal, round, reactive to light  III,VII: ptosis None  III,IV,VI: extraocular muscles  Full ROM  V: mastication  Normal  V: facial light touch sensation  Normal  V,VII: corneal reflex  Present  VII: facial muscle function - upper  Normal  VII: facial muscle function - lower Normal  VIII: hearing Not tested  IX: soft palate elevation  Normal  IX,X: gag reflex Present  XI: trapezius strength  5/5  XI: sternocleidomastoid strength 5/5  XI: neck flexion strength  5/5  XII: tongue strength  Normal    Data Review Lab Results  Component Value Date   WBC 8.4 07/29/2013   HGB 10.2* 07/29/2013   HCT 29.9* 07/29/2013   MCV 88.2 07/29/2013   PLT 230 07/29/2013   Lab Results  Component Value Date   NA 130* 07/29/2013   K 4.2 07/29/2013   CL 96 07/29/2013   CO2 26 07/29/2013   BUN 19 07/29/2013   CREATININE 1.45* 07/29/2013   GLUCOSE 109* 07/29/2013   Lab Results  Component Value Date   INR 0.91 06/09/2011    Radiology: Mr Lumbar Spine W Wo Contrast  07/29/2013   CLINICAL DATA:  Back pain with low-grade fever. History of recent spinal surgery and  EXAM: MRI LUMBAR SPINE WITHOUT AND WITH CONTRAST  TECHNIQUE: Multiplanar and multiecho pulse sequences of the lumbar spine were obtained without and with intravenous contrast.  CONTRAST:  19mL MULTIHANCE GADOBENATE DIMEGLUMINE 529 MG/ML IV SOLN  COMPARISON:  Prior radiograph from  06/17/2013  FINDINGS: For the purposes of this dictation, the lowest well-formed intervertebral disc spaces presumed to be the L5-S1 level, and there presumed to be 5 lumbar type vertebral bodies.  Postoperative changes from recent posterior spinal decompression with fusion are seen at the L4 thru S1 levels. Bilateral transpedicular screws are seen at these levels with intervertebral disc spacer at L4-5. The pedicular screws at the L4 level are angulated cephalad and closely approximate the L4 superior endplate, with question of encroachment upon the L3-4 intervertebral disc space (series 3, image 4). Mild retropulsion of the intervertebral spacer device at L4-5 is grossly stable as compared to the prior exam. The grade 1 anterior listhesis of L4 on L5 is stable. Vertebral bodies are otherwise normally aligned.  Hyperintense STIR signal is seen within the L4-5 and L5-S1 disc spaces, which may be related to prior surgery. Signal intensity within the vertebral body bone marrow is within normal limits. Signal intensity within the visualized spinal cord is normal. The conus terminates at L1.  Heterogeneous signal intensity is seen throughout the posterior spinous soft tissues of the lower back, consistent with recent surgery. There is enhancement of the nerve roots of the cauda equina.  At T12-L1 and L1-2, there is no disc bulge or disc protrusion. No significant facet arthrosis. No canal or neural foraminal stenosis.  At L2-3, there is degenerative disc desiccation with diffuse disc bulge and moderate bilateral facet hypertrophy. Mild canal stenosis without significant foraminal narrowing is stable as compared to prior exam.  At L3-4, sequelae of prior decompressive right hemi laminectomy are seen. A loculated T2 hyperintense, T1 hypo intense peripherally enhancing collection that measures 1.5 x 0.9 x 1.9 cm is seen within the epidural space along the right aspect of the thecal sac (series 5, image 22). There is  secondary mass effect with leftward Next item postoperative changes displacement of the thecal sac and resultant moderate canal stenosis. A 2nd T2 hyperintense, T1 hypointense peripherally enhancing collection located slightly more posteriorly and inferiorly at the laminectomy site measures 1.4 x 0.9 x 1.7 cm (series 5, image 24). There is  abnormal enhancement throughout the epidural space at this level. Third loculated fluid collection located slightly more superiorly just to the right of midline measures 1.2 x 0.7 cm (series 9, image 20) Moderate right foraminal stenosis is present. No definite recurrent disc herniation.  At L4-5, sequelae of prior bilateral hemi laminectomy are seen. There is abnormal enhancing soft tissue with heterogeneous enhancing fluid collections present within the laminectomy bed. The largest of these collections is located just to the right of midline and measures 0.9 x 1.1 x 1.5 cm (series 9, image 33). A 2nd collection located at the medial aspect of the left transpedicular screw measures approximately 1.2 x 1.1 cm (series 9, image 33). There is mild canal in bilateral foraminal stenosis.  At L5-S1, sequelae of prior decompressive laminectomy are seen. There is mild enhancement within the soft tissues in the laminectomy bed without frank fluid collection. No canal stenosis. Diffuse disc bulge and disc desiccation is similar as compared to the prior exam. Moderate bilateral foraminal stenosis is unchanged.  Visualized visceral structures are within normal limits. The bladder is distended.  IMPRESSION: 1. 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 2. Additional loculated abscesses within the laminectomy site at L3-4 and L4-5 as detailed above. 3. Postoperative changes from recent posterior spinal decompression with fusion and microdiskectomy at L4 thru S1. The bilateral transpedicular screws at L4 are  angulated cephalad and closely approximate the superior endplate of L4, with question of possible encroachment upon the L3-4 intervertebral disc space. Correlation with plain film radiography would be helpful for further evaluation of this as clinically desired.  Critical Value/emergent results were called by telephone at the time of interpretation on 07/29/2013 at 10:59 PM to Dr. Devoria AlbeIVA KNAPP , who verbally acknowledged these results.   Electronically Signed   By: Rise MuBenjamin  McClintock M.D.   On: 07/29/2013 23:14   MRI has been reviewed  Assessment/Plan: 73 year old who is 6 weeks status post 2 level decompression and fusion who presents with severe back pain. 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 Certainly, clinically he appears to initially have a deep infection a stone his severe pain and pain with movement. However his white count is normal and he is afebrile and his wound is pristine. This can be seen with deep wound infection, and I sent a sedimentation rate and a CRP. 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 I do worry that he had some element of meningitis based upon his nuchal rigidity in his headaches. He also has clumping of the nerve roots within the dura and potential and hand splint of those nerve roots. I do not see a frank large enhancing fluid collection to suggest he has a large abscess that needs drainage with early surgery. Also, surgery would not be without risk here given the fact that his wound is pristine and well-healed he had evidence of a CSF leak at his previous surgery . I do worry about putting him on empiric antibiotics as this will decrease our chance of getting good cultures and using culture directed antibiotics, but at  this point I think there are risks to not treating with IV antibiotics. I will get blood  cultures in hopes of obtaining an organism. He is being treated for a UTI with oral antibiotics may be therefore he has a partially treated deep wound infection. I have checked the patient now to my partner who is on-call today to follow up on some of the studies to evaluate images and the patient.   Albaraa Swingle S 07/30/2013 8:53 AM

## 2013-07-30 NOTE — Progress Notes (Addendum)
Patient appears much more comfortable than earlier described by Dr. Yetta BarreJones. He does complain of back pain. Does complain of some left leg pain. He is having no new right-sided symptoms. His symptoms of weakness and sensory loss in both legs is questionably somewhat increased from where it was over the past few months but overall not terribly different.  He is afebrile. His vitals are stable. His wound is well-healed without evidence of swelling or major tenderness. He does have some pain with straight leg raising bilaterally. He has a mild neck rigidity. Motor examination reveals 4 minus over 5 strength in his lower extremities which apparently is stable from where he has been since his most recent operation.  I reviewed the MRI scan. This demonstrates postoperative change on the right-sided L3-4. I am not certain whether the fluid collection represents epidural abscess versus postoperative surgical glue and/or pseudomeningocele. His sedimentation rate is normal. He does have an ongoing urinary tract infection which may be causing some degree of urosepsis and generalized increased inflammation. I like patient to treat with antibiotics and observe closely. I do not see an urgent indication to reexplore his wound.

## 2013-07-30 NOTE — Progress Notes (Signed)
Utilization Review completed.  

## 2013-07-31 ENCOUNTER — Inpatient Hospital Stay (HOSPITAL_COMMUNITY): Payer: Medicare Other

## 2013-07-31 DIAGNOSIS — R911 Solitary pulmonary nodule: Secondary | ICD-10-CM | POA: Diagnosis not present

## 2013-07-31 MED ORDER — GUAIFENESIN 100 MG/5ML PO SYRP
200.0000 mg | ORAL_SOLUTION | ORAL | Status: DC | PRN
  Administered 2013-08-04: 200 mg via ORAL
  Filled 2013-07-31 (×2): qty 10

## 2013-07-31 MED ORDER — NALOXONE HCL 0.4 MG/ML IJ SOLN: 0.4000 mg | INTRAMUSCULAR | Status: DC | PRN

## 2013-07-31 MED ORDER — DIPHENHYDRAMINE HCL 50 MG/ML IJ SOLN
12.5000 mg | Freq: Four times a day (QID) | INTRAMUSCULAR | Status: DC | PRN
  Administered 2013-08-03 – 2013-08-08 (×3): 12.5 mg via INTRAVENOUS
  Filled 2013-07-31 (×3): qty 1

## 2013-07-31 MED ORDER — ONDANSETRON HCL 4 MG/2ML IJ SOLN
4.0000 mg | Freq: Four times a day (QID) | INTRAMUSCULAR | Status: DC | PRN
Start: 2013-07-31 — End: 2013-08-08
  Administered 2013-07-31 – 2013-08-02 (×4): 4 mg via INTRAVENOUS
  Filled 2013-07-31 (×4): qty 2

## 2013-07-31 MED ORDER — SODIUM CHLORIDE 0.9 % IJ SOLN: 9.0000 mL | INTRAMUSCULAR | Status: DC | PRN

## 2013-07-31 MED ORDER — METHOCARBAMOL 100 MG/ML IJ SOLN
1000.0000 mg | Freq: Four times a day (QID) | INTRAVENOUS | Status: DC | PRN
  Filled 2013-07-31 (×2): qty 10

## 2013-07-31 MED ORDER — DIPHENHYDRAMINE HCL 12.5 MG/5ML PO ELIX: 12.5000 mg | ORAL_SOLUTION | Freq: Four times a day (QID) | ORAL | Status: DC | PRN

## 2013-07-31 NOTE — Progress Notes (Signed)
Subjective: Patient reports Having a lot of pain all localized to his back he denies any significant leg pain  Objective: Vital signs in last 24 hours: Temp:  [97.9 F (36.6 C)-99.6 F (37.6 C)] 98.3 F (36.8 C) (01/04 0439) Pulse Rate:  [70-81] 81 (01/04 0439) Resp:  [15-20] 15 (01/04 0808) BP: (111-156)/(47-61) 111/47 mmHg (01/04 0439) SpO2:  [92 %-100 %] 99 % (01/04 0808) FiO2 (%):  [63 %] 63 % (01/03 1200)  Intake/Output from previous day: 01/03 0701 - 01/04 0700 In: 480 [P.O.:480] Out: 1700 [Urine:1700] Intake/Output this shift:    Strength appears to be 4-4 minus out of 5 in both lower extremities bilaterally stable from admission  Lab Results:  Recent Labs  07/29/13 1929  WBC 8.4  HGB 10.2*  HCT 29.9*  PLT 230   BMET  Recent Labs  07/29/13 1929  NA 130*  K 4.2  CL 96  CO2 26  GLUCOSE 109*  BUN 19  CREATININE 1.45*  CALCIUM 9.2    Studies/Results: Mr Lumbar Spine W Wo Contrast  07/29/2013   CLINICAL DATA:  Back pain with low-grade fever. History of recent spinal surgery and  EXAM: MRI LUMBAR SPINE WITHOUT AND WITH CONTRAST  TECHNIQUE: Multiplanar and multiecho pulse sequences of the lumbar spine were obtained without and with intravenous contrast.  CONTRAST:  19mL MULTIHANCE GADOBENATE DIMEGLUMINE 529 MG/ML IV SOLN  COMPARISON:  Prior radiograph from 06/17/2013  FINDINGS: For the purposes of this dictation, the lowest well-formed intervertebral disc spaces presumed to be the L5-S1 level, and there presumed to be 5 lumbar type vertebral bodies.  Postoperative changes from recent posterior spinal decompression with fusion are seen at the L4 thru S1 levels. Bilateral transpedicular screws are seen at these levels with intervertebral disc spacer at L4-5. The pedicular screws at the L4 level are angulated cephalad and closely approximate the L4 superior endplate, with question of encroachment upon the L3-4 intervertebral disc space (series 3, image 4). Mild  retropulsion of the intervertebral spacer device at L4-5 is grossly stable as compared to the prior exam. The grade 1 anterior listhesis of L4 on L5 is stable. Vertebral bodies are otherwise normally aligned.  Hyperintense STIR signal is seen within the L4-5 and L5-S1 disc spaces, which may be related to prior surgery. Signal intensity within the vertebral body bone marrow is within normal limits. Signal intensity within the visualized spinal cord is normal. The conus terminates at L1.  Heterogeneous signal intensity is seen throughout the posterior spinous soft tissues of the lower back, consistent with recent surgery. There is enhancement of the nerve roots of the cauda equina.  At T12-L1 and L1-2, there is no disc bulge or disc protrusion. No significant facet arthrosis. No canal or neural foraminal stenosis.  At L2-3, there is degenerative disc desiccation with diffuse disc bulge and moderate bilateral facet hypertrophy. Mild canal stenosis without significant foraminal narrowing is stable as compared to prior exam.  At L3-4, sequelae of prior decompressive right hemi laminectomy are seen. A loculated T2 hyperintense, T1 hypo intense peripherally enhancing collection that measures 1.5 x 0.9 x 1.9 cm is seen within the epidural space along the right aspect of the thecal sac (series 5, image 22). There is secondary mass effect with leftward Next item postoperative changes displacement of the thecal sac and resultant moderate canal stenosis. A 2nd T2 hyperintense, T1 hypointense peripherally enhancing collection located slightly more posteriorly and inferiorly at the laminectomy site measures 1.4 x 0.9 x 1.7 cm (  series 5, image 24). There is abnormal enhancement throughout the epidural space at this level. Third loculated fluid collection located slightly more superiorly just to the right of midline measures 1.2 x 0.7 cm (series 9, image 20) Moderate right foraminal stenosis is present. No definite recurrent disc  herniation.  At L4-5, sequelae of prior bilateral hemi laminectomy are seen. There is abnormal enhancing soft tissue with heterogeneous enhancing fluid collections present within the laminectomy bed. The largest of these collections is located just to the right of midline and measures 0.9 x 1.1 x 1.5 cm (series 9, image 33). A 2nd collection located at the medial aspect of the left transpedicular screw measures approximately 1.2 x 1.1 cm (series 9, image 33). There is mild canal in bilateral foraminal stenosis.  At L5-S1, sequelae of prior decompressive laminectomy are seen. There is mild enhancement within the soft tissues in the laminectomy bed without frank fluid collection. No canal stenosis. Diffuse disc bulge and disc desiccation is similar as compared to the prior exam. Moderate bilateral foraminal stenosis is unchanged.  Visualized visceral structures are within normal limits. The bladder is distended.  IMPRESSION: 1. 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 2. Additional loculated abscesses within the laminectomy site at L3-4 and L4-5 as detailed above. 3. Postoperative changes from recent posterior spinal decompression with fusion and microdiskectomy at L4 thru S1. The bilateral transpedicular screws at L4 are angulated cephalad and closely approximate the superior endplate of L4, with question of possible encroachment upon the L3-4 intervertebral disc space. Correlation with plain film radiography would be helpful for further evaluation of this as clinically desired.  Critical Value/emergent results were called by telephone at the time of interpretation on 07/29/2013 at 10:59 PM to Dr. Devoria Albe , who verbally acknowledged these results.   Electronically Signed   By: Rise Mu M.D.   On: 07/29/2013 23:14    Assessment/Plan: Patient hospital day 2 from readmission of recurrent low back pain workup still  in progress cultures are still negative sedimentation rate and C-reactive protein mildly elevated but not elevated to the degree that I would expect with a lumbar wound infection, discitis or osteomyelitis will review his medication regimen we'll change around his muscle relaxers regarding on ballottement PCA will await his culture results and Dr. Cassandria Santee return in evaluation  LOS: 2 days     Ednah Hammock P 07/31/2013, 8:27 AM

## 2013-07-31 NOTE — Progress Notes (Signed)
MEDICATION RELATED CONSULT NOTE   Pharmacy Re:  Methocarbamol IV Indication: Renal Insufficiency  Labs:  Recent Labs  07/29/13 1929  WBC 8.4  HGB 10.2*  HCT 29.9*  PLT 230  CREATININE 1.45*  ALBUMIN 3.1*  PROT 5.6*  AST 16  ALT 11  ALKPHOS 67  BILITOT 0.5   Estimated Creatinine Clearance: 52 ml/min (by C-G formula based on Cr of 1.45).    Assessment: 73 yo male with back and leg pain has been ordered to receive IV Methocarbamol 1000mg  every 6 hours as needed.  Please note package insert information:  Robaxin Injectable should not be administered to patients with known or suspected renal pathology. This caution is necessary because of the presence of polyethylene glycol 300 in the vehicle. A much larger amount of polyethylene glycol 300 than is present in recommended doses of Robaxin Injectable is known to have increased pre-existing acidosis and urea retention in patients with renal impairment. Although the amount present in this preparation is well within the limits of safety, caution dictates this contraindication   Plan:  1.  Consider no more than 3 gm/day and limited use of IV product due to potential for adverse events.  Nadara MustardNita Kataleyah Carducci, PharmD., MS Clinical Pharmacist Pager:  [phone] Thank you for allowing pharmacy to be part of this patients care team. 07/31/2013,8:35 AM

## 2013-08-01 DIAGNOSIS — M545 Low back pain, unspecified: Secondary | ICD-10-CM

## 2013-08-01 DIAGNOSIS — R509 Fever, unspecified: Secondary | ICD-10-CM | POA: Diagnosis not present

## 2013-08-01 LAB — VANCOMYCIN, TROUGH: Vancomycin Tr: 12.7 ug/mL (ref 10.0–20.0)

## 2013-08-01 MED ORDER — VANCOMYCIN HCL IN DEXTROSE 1-5 GM/200ML-% IV SOLN
1000.0000 mg | Freq: Two times a day (BID) | INTRAVENOUS | Status: DC
  Administered 2013-08-01 – 2013-08-08 (×13): 1000 mg via INTRAVENOUS
  Filled 2013-08-01 (×15): qty 200

## 2013-08-01 MED ORDER — DEXTROSE 5 % IV SOLN
2.0000 g | INTRAVENOUS | Status: DC
  Administered 2013-08-01 – 2013-08-07 (×7): 2 g via INTRAVENOUS
  Filled 2013-08-01 (×8): qty 2

## 2013-08-01 NOTE — Progress Notes (Signed)
Patient ID: [name], male   DOB: 1941/05/18, 73 y.o.   MRN: [id number] Request received for fluoroscopic guided aspiration of L3-4,L4-5 disc space/paraspinal fluid collections in pt with hx of prior lumbar decompression/fusion 08/2012 complicated by durotomy , CSF leak, cauda equina syndrome, bowel/bladder dysfunction. Pt now with low back pain, low grade fever, normal WBC and MRI revealing possible epidural abscess at L3-4 with additional collections at laminectomy site. Imaging studies have been reviewed by Dr. Corliss Skains. Additional PMH as below. Exam: pt awake/alert; chest- CTA bilat ant; heart- RRR; abd- soft,+BS,NT; ext- dim sens /motor fxn LLE Filed Vitals:   08/01/13 1226 08/01/13 1321 08/01/13 1604 08/01/13 1705  BP:  112/42  125/46  Pulse:  76  75  Temp:  98.1 F (36.7 C)  98.5 F (36.9 C)  TempSrc:  Oral  Oral  Resp: 19 20 20 19   Height:      Weight:      SpO2: 100% 97% 100% 98%   Past Medical History  Diagnosis Date  . Hyperlipidemia   . Small bowel problem     HAD ALOT OF SCAR TISSUE FROM PREVIOUS SURGERIES..NG WAS INSERTED ...Marland KitchenMarland KitchenNO SURGERY NEEDED...Marland KitchenMarland KitchenIN FOR 8 DAYS  . Disorder of blood     BEEN TREATED BY DERMATOLOGIST X 4 YRS..."BLOOD BLISTERS"  . Arthritis   . Gout   . Anxiety   . Hx MRSA infection     rt shoulder  . Pneumonia     "I've had it 3-4 times"  . Coronary artery disease     IN 2000   STENT PLACED IN 2012 sees Dr. Dietrich Pates, saw last 2013  . HTN (hypertension)     sees [name] in [place]  . Small bowel obstruction   . Ventral hernia   . Acute renal failure 04/17/2013  . 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 Sternal surg  2000    HAS HAD 5-6 ON HIS STERNUM; "caught MRSA in it"  . Lumbar laminectomy/decompression microdiscectomy  06/13/2011    Procedure: LUMBAR LAMINECTOMY/DECOMPRESSION MICRODISCECTOMY;  Surgeon: Karn Cassis;  Location: [facility];  Service:  Neurosurgery;  Laterality: N/A;  Lumbar three, lumbar four-five Laminectomy  . Eye surgery      bilateral cataract  . Anterior cervical corpectomy  12/17/11  . Incision and drainage of wound  ~ 20ll; 12/03/11    "had infection in my right"  . Shoulder open rotator cuff repair  ~ 2011    right  . Peripherally inserted central catheter insertion  2011 & 11/2011  . Cataract extraction w/ intraocular lens  implant, bilateral  ? 2011  . Coronary artery bypass graft  2000    CABG X5  . Back surgery      lumbar  . Tonsillectomy  1949  . Cholecystectomy  2006 "or after"  . Coronary angioplasty with stent placement  2012  . Anterior cervical corpectomy  12/17/2011    Procedure: ANTERIOR CERVICAL CORPECTOMY;  Surgeon: Karn Cassis, MD;  Location: [facility];  Service: Neurosurgery;  Laterality: N/A;  Anterior Cervical Decompression Fusion Five to Thoracic Two with plating  . Lumbar laminectomy/decompression microdiscectomy Right 06/17/2013    Procedure: LUMBAR LAMINECTOMY/DECOMPRESSION MICRODISCECTOMY 1 LEVEL;  Surgeon: Karn Cassis, MD;  Location: [facility];  Service: Neurosurgery;  Laterality: Right;  Right L3-4 Microdiskectomy   Results for orders placed during the hospital encounter of 07/29/13  CULTURE,  BLOOD (ROUTINE X 2)      Result Value Range   Specimen Description BLOOD LEFT ARM     Special Requests BOTTLES DRAWN AEROBIC ONLY 10CC     Culture  Setup Time       Value: 07/30/2013 18:10     Performed at [facility]   Culture       Value:        BLOOD CULTURE RECEIVED NO GROWTH TO DATE CULTURE WILL BE HELD FOR 5 DAYS BEFORE ISSUING A FINAL NEGATIVE REPORT     Performed at [facility]   Report Status PENDING    CULTURE, BLOOD (ROUTINE X 2)      Result Value Range   Specimen Description BLOOD LEFT HAND     Special Requests BOTTLES DRAWN AEROBIC ONLY 10CC     Culture  Setup Time       Value: 07/30/2013 18:10     Performed at [facility]   Culture        Value:        BLOOD CULTURE RECEIVED NO GROWTH TO DATE CULTURE WILL BE HELD FOR 5 DAYS BEFORE ISSUING A FINAL NEGATIVE REPORT     Performed at [facility]   Report Status PENDING    URINALYSIS, ROUTINE W REFLEX MICROSCOPIC      Result Value Range   Color, Urine YELLOW  YELLOW   APPearance CLEAR  CLEAR   Specific Gravity, Urine 1.010  1.005 - 1.030   pH 6.0  5.0 - 8.0   Glucose, UA NEGATIVE  NEGATIVE mg/dL   Hgb urine dipstick NEGATIVE  NEGATIVE   Bilirubin Urine NEGATIVE  NEGATIVE   Ketones, ur NEGATIVE  NEGATIVE mg/dL   Protein, ur NEGATIVE  NEGATIVE mg/dL   Urobilinogen, UA 0.2  0.0 - 1.0 mg/dL   Nitrite NEGATIVE  NEGATIVE   Leukocytes, UA NEGATIVE  NEGATIVE  CBC WITH DIFFERENTIAL      Result Value Range   WBC 8.4  4.0 - 10.5 K/uL   RBC 3.39 (*) 4.22 - 5.81 MIL/uL   Hemoglobin 10.2 (*) 13.0 - 17.0 g/dL   HCT 16.129.9 (*) 09.639.0 - 04.552.0 %   MCV 88.2  78.0 - 100.0 fL   MCH 30.1  26.0 - 34.0 pg   MCHC 34.1  30.0 - 36.0 g/dL   RDW 40.914.2  81.111.5 - 91.415.5 %   Platelets 230  150 - 400 K/uL   Neutrophils Relative % 69  43 - 77 %   Neutro Abs 5.8  1.7 - 7.7 K/uL   Lymphocytes Relative 14  12 - 46 %   Lymphs Abs 1.2  0.7 - 4.0 K/uL   Monocytes Relative 13 (*) 3 - 12 %   Monocytes Absolute 1.1 (*) 0.1 - 1.0 K/uL   Eosinophils Relative 4  0 - 5 %   Eosinophils Absolute 0.3  0.0 - 0.7 K/uL   Basophils Relative 0  0 - 1 %   Basophils Absolute 0.0  0.0 - 0.1 K/uL  COMPREHENSIVE METABOLIC PANEL      Result Value Range   Sodium 130 (*) 137 - 147 mEq/L   Potassium 4.2  3.7 - 5.3 mEq/L   Chloride 96  96 - 112 mEq/L   CO2 26  19 - 32 mEq/L   Glucose, Bld 109 (*) 70 - 99 mg/dL   BUN 19  6 - 23 mg/dL   Creatinine, Ser 7.821.45 (*) 0.50 - 1.35 mg/dL  Calcium 9.2  8.4 - 10.5 mg/dL   Total Protein 5.6 (*) 6.0 - 8.3 g/dL   Albumin 3.1 (*) 3.5 - 5.2 g/dL   AST 16  0 - 37 U/L   ALT 11  0 - 53 U/L   Alkaline Phosphatase 67  39 - 117 U/L   Total Bilirubin 0.5  0.3 - 1.2 mg/dL   GFR calc non  Af Amer 47 (*) >90 mL/min   GFR calc Af Amer 55 (*) >90 mL/min  SEDIMENTATION RATE      Result Value Range   Sed Rate 23 (*) 0 - 16 mm/hr  C-REACTIVE PROTEIN      Result Value Range   CRP 2.4 (*) <0.60 mg/dL  VANCOMYCIN, TROUGH      Result Value Range   Vancomycin Tr 12.7  10.0 - 20.0 ug/mL   A/P: Pt with prior lumbar decompression/fusion 08/2012 complicated by durotomy,CSF leak, cauda equina syndrome, bowel/bladder dysfunction; now with mod to severe back pain, recent low grade fevers, findings of epidural fluid collections at L3-4 with additional collections at laminectomy site. Plan is for fluoroscopic guided aspiration of the collections on 1/6. Details/risks of procedure d/w pt/wife with their understanding and consent.

## 2013-08-01 NOTE — Consult Note (Signed)
[facility]  Total days of antibiotics 3        Day 3 rifampin        Day 3 vancomycin        Day 3 ceftriaxone       Reason for Consult: epidural abscess    Referring Physician: botero  Active Problems:   Post-operative pain    HPI: [name] is a 73 y.o. male with past history of lumbar decompression in feb 2014 and also L3-L4-S1 fusion on 11/21 c/b  unintended durotomy with csf leak until 11/24 but also cauda equina syndrome with severe weakness of the lower extremities after the surgery and bowel/bladder dysfunction. He was admitted to rehabilitation from 11/26- 12/12 and discharged home. He presents to ED on 1/2 with 3- 4 days history of severe low back pain without obvious trauma or injury to the back in addition to  low-grade fevers. In the ED, temp 100.38F, wbc WNL but  MRI with and without contrast suggested epidural fluid collection and some small ring-enhancing fluid collections in the soft tissues.  he was currently being treated with oral antibiotics for a presumed UTI by his PCP. He is on Day #2 of vancomycin, rifampin and ceftriaxone. Blood cx NGTD but fluid collection not sampled yet.   Past Medical History  Diagnosis Date  . Hyperlipidemia   . Small bowel problem     HAD ALOT OF SCAR TISSUE FROM PREVIOUS SURGERIES..NG WAS INSERTED ...Marland KitchenMarland KitchenNO SURGERY NEEDED...Marland KitchenMarland KitchenIN FOR 8 DAYS  . Disorder of blood     BEEN TREATED BY DERMATOLOGIST X 4 YRS..."BLOOD BLISTERS"  . Arthritis   . Gout   . Anxiety   . Hx MRSA infection     rt shoulder  . Pneumonia     "I've had it 3-4 times"  . Coronary artery disease     IN 2000   STENT PLACED IN 2012 sees Dr. Dietrich Pates, saw last 2013  . HTN (hypertension)     sees [name] in [place]  . Small bowel obstruction   . Ventral hernia   . Acute renal failure 04/17/2013  . 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 Morphine Other (See  Comments)    REACTION: made him go crazy; "I had fun w/it; my family didn't think it was too funny"  . Metoprolol Other (See Comments)    REACTION:  Tachycardia; "don't remember how bad it was; it was so long ago"    MEDICATIONS: . bethanechol  50 mg Oral QID  . cefTRIAXone (ROCEPHIN)  IV  2 g Intravenous Q24H  . citalopram  40 mg Oral Daily  . doxazosin  8 mg Oral QHS  . gabapentin  300 mg Oral TID  . HYDROmorphone PCA 0.3 mg/mL   Intravenous Q4H  . rifampin  300 mg Oral Q12H  . tamsulosin  0.4 mg Oral QPC breakfast  . vancomycin  750 mg Intravenous Q12H  . vitamin C  500 mg Oral Daily    History  Substance Use Topics  . Smoking status: Former Smoker -- 1.00 packs/day for 1 years    Types: Cigarettes    Quit date: 12/27/1998  . Smokeless tobacco: Never Used  . Alcohol Use: No    Family History  Problem Relation Age of Onset  . Heart disease    . Hypertension Mother   . Hypertension Maternal Aunt   .  Hypertension Maternal Uncle   . Anesthesia problems Neg Hx   . Hypotension Neg Hx   . Malignant hyperthermia Neg Hx   . Pseudochol deficiency Neg Hx      Review of Systems  Constitutional: positive for fever, chills, diaphoresis, and activity change,but negative appetite change, fatigue and unexpected weight change.  HENT: Negative for congestion, sore throat, rhinorrhea, sneezing, trouble swallowing and sinus pressure.  Eyes: Negative for photophobia and visual disturbance.  Respiratory: Negative for cough, chest tightness, shortness of breath, wheezing and stridor.  Cardiovascular: Negative for chest pain, palpitations and leg swelling.  Gastrointestinal: Negative for nausea, vomiting, abdominal pain, diarrhea, constipation, blood in stool, abdominal distention and anal bleeding.  Genitourinary: Negative for dysuria, hematuria, flank pain and difficulty urinating.  Musculoskeletal: positive for back pain. But negative for  myalgias,  joint swelling, arthralgias and gait  problem.  Skin: Negative for color change, pallor, rash and wound.  Neurological: Negative for dizziness, tremors, weakness and light-headedness.  Hematological: Negative for adenopathy. Does not bruise/bleed easily.  Psychiatric/Behavioral: Negative for behavioral problems, confusion, sleep disturbance, dysphoric mood, decreased concentration and agitation.     OBJECTIVE: Temp:  [97.3 F (36.3 C)-98.6 F (37 C)] 98 F (36.7 C) (01/05 0906) Pulse Rate:  [69-85] 74 (01/05 0906) Resp:  [13-20] 20 (01/05 0906) BP: (109-153)/(42-61) 146/61 mmHg (01/05 0906) SpO2:  [95 %-99 %] 98 % (01/05 0906) FiO2 (%):  [65 %-96 %] 65 % (01/05 0809) Physical Exam  Constitutional: He is oriented to person, place, and time. He appears well-developed and well-nourished. No distress.  HENT:  Mouth/Throat: Oropharynx is clear and moist. No oropharyngeal exudate.  Cardiovascular: Normal rate, regular rhythm and normal heart sounds. Exam reveals no gallop and no friction rub.  No murmur heard.  Pulmonary/Chest: Effort normal and breath sounds normal. No respiratory distress. He has no wheezes.  Abdominal: Soft. Bowel sounds are normal. He exhibits no distension. There is no tenderness.  Lymphadenopathy:  He has no cervical adenopathy.  Neurological: He is alert and oriented to person, place, and time. Lower extremity weakness. Sensitive to moving lower extremity ? neuropathy Skin: Skin is warm and dry. No rash noted. No erythema.  Psychiatric: He has a normal mood and affect. His behavior is normal.    LABS: Results for orders placed during the hospital encounter of 07/29/13 (from the past 48 hour(s))  CULTURE, BLOOD (ROUTINE X 2)     Status: None   Collection Time    07/30/13 10:10 AM      Result Value Range   Specimen Description BLOOD LEFT ARM     Special Requests BOTTLES DRAWN AEROBIC ONLY 10CC     Culture  Setup Time       Value: 07/30/2013 18:10     Performed at [facility]   Culture        Value:        BLOOD CULTURE RECEIVED NO GROWTH TO DATE CULTURE WILL BE HELD FOR 5 DAYS BEFORE ISSUING A FINAL NEGATIVE REPORT     Performed at [facility]   Report Status PENDING    CULTURE, BLOOD (ROUTINE X 2)     Status: None   Collection Time    07/30/13 10:15 AM      Result Value Range   Specimen Description BLOOD LEFT HAND     Special Requests BOTTLES DRAWN AEROBIC ONLY 10CC     Culture  Setup Time       Value: 07/30/2013 18:10  Performed at [facility]       Value:        BLOOD CULTURE RECEIVED NO GROWTH TO DATE CULTURE WILL BE HELD FOR 5 DAYS BEFORE ISSUING A FINAL NEGATIVE REPORT     Performed at [facility]   Report Status PENDING      MICRO: 1/3 blood cx NGTD IMAGING: Dg Chest 2 View  07/31/2013   CLINICAL DATA:  Cough.  EXAM: CHEST  2 VIEW  COMPARISON:  06/17/2013 and 06/09/2011  FINDINGS: Lungs are adequately inflated and demonstrate stable chronic changes in the right base with stable dense nodule in the right lower lobe likely calcified granuloma. There is mild stable cardiomegaly. Anterior fusion hardware is present over the cervical thoracic spine. Remainder the exam is unchanged.  IMPRESSION: No acute cardiopulmonary disease.  Chronic stable changes over the right lung base. Stable mild cardiomegaly.   Electronically Signed   By: Elberta Fortis M.D.   On: 07/31/2013 17:59    Assessment/Plan:  73yo M with L3-S1 fusion on 11/21 c/b durotomy and cauda equina syndrome presents with worsening low back pain and fevers. MRI concerning for epidural abscess  - recommend tissue/fluid be obtained via IR or OR for culture - he remains on antibiotics which may sterilize culture so if patient not going to OR,will need IR sampling today to help guide antibiotic regimen - continue on broad spectrum antibiotics for now.  Duke Salvia Drue Second MD MPH [facility] [phone]

## 2013-08-01 NOTE — Progress Notes (Signed)
Patient ID: [name], male   DOB: 09/29/1940, 73 y.o.   MRN: [id number] Spoke with mr [name]. His trouble started several days ago after a "awful" bowel movement. Still wit paraparesis  And bowel and bladder not working properly. Wound dry with no evidence of infection. Mri lumbar seen . There is a collection at l3-4 with stenosis. i will get ID input to see if we can do an aspiration biopsy or to go ahead with exploration of the wound. Mr [name] agrees

## 2013-08-01 NOTE — Progress Notes (Signed)
ANTIBIOTIC CONSULT NOTE - INITIAL  Pharmacy Consult for Rocephin/Vanco Indication: Lumbar wound  Allergies  Allergen Reactions  . Morphine Other (See Comments)    REACTION: made him go crazy; "I had fun w/it; my family didn't think it was too funny"  . Metoprolol Other (See Comments)    REACTION:  Tachycardia; "don't remember how bad it was; it was so long ago"    Patient Measurements: Height: 6' (182.9 cm) Weight: 179 lb 14.4 oz (81.602 kg) IBW/kg (Calculated) : 77.6 Adjusted Body Weight:    Vital Signs: Temp: 98 F (36.7 C) (01/05 0906) Temp src: Oral (01/05 0906) BP: 146/61 mmHg (01/05 0906) Pulse Rate: 74 (01/05 0906) Intake/Output from previous day: 01/04 0701 - 01/05 0700 In: 750 [I.V.:350; IV Piggyback:400] Out: 1950 [Urine:1950] Intake/Output from this shift: Total I/O In: 120 [P.O.:120] Out: -   Labs:  Recent Labs  07/29/13 1929  WBC 8.4  HGB 10.2*  PLT 230  CREATININE 1.45*   Estimated Creatinine Clearance: 52 ml/min (by C-G formula based on Cr of 1.45).  Recent Labs  08/01/13 1010  VANCOTROUGH 12.7     Microbiology: Recent Results (from the past 720 hour(s))  CULTURE, BLOOD (ROUTINE X 2)     Status: None   Collection Time    07/30/13 10:10 AM      Result Value Range Status   Specimen Description BLOOD LEFT ARM   Final   Special Requests BOTTLES DRAWN AEROBIC ONLY 10CC   Final   Culture  Setup Time     Final   Value: 07/30/2013 18:10     Performed at [facility]   Culture     Final   Value:        BLOOD CULTURE RECEIVED NO GROWTH TO DATE CULTURE WILL BE HELD FOR 5 DAYS BEFORE ISSUING A FINAL NEGATIVE REPORT     Performed at [facility]   Report Status PENDING   Incomplete  CULTURE, BLOOD (ROUTINE X 2)     Status: None   Collection Time    07/30/13 10:15 AM      Result Value Range Status   Specimen Description BLOOD LEFT HAND   Final   Special Requests BOTTLES DRAWN AEROBIC ONLY 10CC   Final   Culture  Setup Time      Final   Value: 07/30/2013 18:10     Performed at [facility]   Culture     Final   Value:        BLOOD CULTURE RECEIVED NO GROWTH TO DATE CULTURE WILL BE HELD FOR 5 DAYS BEFORE ISSUING A FINAL NEGATIVE REPORT     Performed at [facility]   Report Status PENDING   Incomplete    Medical History: Past Medical History  Diagnosis Date  . Hyperlipidemia   . Small bowel problem     HAD ALOT OF SCAR TISSUE FROM PREVIOUS SURGERIES..NG WAS INSERTED ...Marland Kitchen.Marland Kitchen.NO SURGERY NEEDED...Marland Kitchen.Marland Kitchen.IN FOR 8 DAYS  . Disorder of blood     BEEN TREATED BY DERMATOLOGIST X 4 YRS..."BLOOD BLISTERS"  . Arthritis   . Gout   . Anxiety   . Hx MRSA infection     rt shoulder  . Pneumonia     "I've had it 3-4 times"  . Coronary artery disease     IN 2000   STENT PLACED IN 2012 sees Dr. Dietrich Patesothbart, saw last 2013  . HTN (hypertension)     [place]. [place]  . Small  bowel obstruction   . Ventral hernia   . Acute renal failure 04/17/2013  . 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 atorvastatin (LIPITOR) 40 MG tablet Take 1 tablet (40 mg total) by mouth daily.  90 tablet  2  . bethanechol (URECHOLINE) 50 MG tablet Take 1 tablet (50 mg total) by mouth 4 (four) times daily.  120 tablet  1  . citalopram (CELEXA) 40 MG tablet Take 40 mg by mouth daily.      . Cyanocobalamin (VITAMIN B 12 PO) Take 1 tablet by mouth daily.      Marland Kitchen doxazosin (CARDURA) 4 MG tablet Take 8 mg by mouth at bedtime.      . gabapentin (NEURONTIN) 300 MG capsule Take 1 capsule (300 mg total) by mouth 3 (three) times daily.  90 capsule  1  . oxyCODONE (ROXICODONE) 15 MG immediate release tablet Take 7.5 mg by mouth every 4 (four) hours as needed. For pain      . predniSONE (DELTASONE) 5 MG tablet Take 5 mg by mouth daily.      . tamsulosin (FLOMAX) 0.4 MG CAPS capsule Take 1 capsule (0.4 mg total) by mouth daily after breakfast.  30  capsule  1  . vitamin C (ASCORBIC ACID) 500 MG tablet Take 500 mg by mouth daily.      Marland Kitchen VITAMIN D, CHOLECALCIFEROL, PO Take 2-3 tablets by mouth daily.      Marland Kitchen VITAMIN E PO Take 2-3 tablets by mouth daily.      . nitroGLYCERIN (NITROSTAT) 0.4 MG SL tablet Place 0.4 mg under the tongue every 5 (five) minutes as needed. For chest pain       Assessment: 73 y/o M with recent back surgery 6 weeks ago presents with pain in the groin area, low back and dysuria.07/29/13 MRI:1. 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 2. Additional loculated abscesses within the laminectomy site at L3-4 and L4-5 as detailed above. Vanc trough came back at 12.7 which is only slightly below goal.    Goal of Therapy:  Vancomycin trough level 15-20 mcg/ml  Plan:   Change vanc to 1g IV q12 Change rocephin to 2g IV q24

## 2013-08-01 NOTE — Progress Notes (Signed)
Patient ID: [name], male   DOB: 11/10/1940, 73 y.o.   MRN: [id number] i did  Talk with dr Kris HartmannSnider(ID) and [name]. Will do a needle aspiration to see what the fluid will yield. If there is a question i will go ahead with lumbar exploration tomorrow afternoon. He is aware that we might dealing with a pseudomeningocele or abscess.

## 2013-08-01 NOTE — Progress Notes (Signed)
[facility]  Patient Status: Active (receiving services up to time of hospitalization)  [facility] is providing the following services: RN, PT and OT  If patient discharges after hours, please call [phone].   Jodene NamMary Hickling 08/01/2013, 2:33 PM

## 2013-08-02 ENCOUNTER — Inpatient Hospital Stay (HOSPITAL_COMMUNITY): Payer: Medicare Other | Admitting: Certified Registered"

## 2013-08-02 ENCOUNTER — Encounter (HOSPITAL_COMMUNITY): Payer: Medicare Other | Admitting: Certified Registered"

## 2013-08-02 ENCOUNTER — Inpatient Hospital Stay (HOSPITAL_COMMUNITY): Payer: Medicare Other

## 2013-08-02 ENCOUNTER — Encounter (HOSPITAL_COMMUNITY): Payer: Self-pay | Admitting: Anesthesiology

## 2013-08-02 ENCOUNTER — Encounter (HOSPITAL_COMMUNITY)
Admission: EM | Disposition: A | Discharge status: Rehabilitation Facility | Payer: Self-pay | Source: Home / Self Care | Attending: Neurosurgery

## 2013-08-02 DIAGNOSIS — M8708 Idiopathic aseptic necrosis of bone, other site: Secondary | ICD-10-CM | POA: Diagnosis not present

## 2013-08-02 DIAGNOSIS — IMO0002 Reserved for concepts with insufficient information to code with codable children: Secondary | ICD-10-CM | POA: Diagnosis not present

## 2013-08-02 DIAGNOSIS — G061 Intraspinal abscess and granuloma: Secondary | ICD-10-CM | POA: Diagnosis not present

## 2013-08-02 DIAGNOSIS — T8140XA Infection following a procedure, unspecified, initial encounter: Secondary | ICD-10-CM | POA: Diagnosis not present

## 2013-08-02 DIAGNOSIS — N390 Urinary tract infection, site not specified: Secondary | ICD-10-CM | POA: Diagnosis not present

## 2013-08-02 DIAGNOSIS — M5136 Other intervertebral disc degeneration, lumbar region: Secondary | ICD-10-CM | POA: Diagnosis present

## 2013-08-02 DIAGNOSIS — M519 Unspecified thoracic, thoracolumbar and lumbosacral intervertebral disc disorder: Secondary | ICD-10-CM | POA: Diagnosis not present

## 2013-08-02 DIAGNOSIS — G8220 Paraplegia, unspecified: Secondary | ICD-10-CM | POA: Diagnosis not present

## 2013-08-02 DIAGNOSIS — Z981 Arthrodesis status: Secondary | ICD-10-CM | POA: Diagnosis not present

## 2013-08-02 DIAGNOSIS — I10 Essential (primary) hypertension: Secondary | ICD-10-CM | POA: Diagnosis not present

## 2013-08-02 HISTORY — PX: LUMBAR WOUND DEBRIDEMENT: SHX1988

## 2013-08-02 LAB — CBC
HCT: 27.5 % — ABNORMAL LOW (ref 39.0–52.0)
HEMOGLOBIN: 9.2 g/dL — AB (ref 13.0–17.0)
MCH: 30 pg (ref 26.0–34.0)
MCHC: 33.5 g/dL (ref 30.0–36.0)
MCV: 89.6 fL (ref 78.0–100.0)
PLATELETS: 213 10*3/uL (ref 150–400)
RBC: 3.07 MIL/uL — ABNORMAL LOW (ref 4.22–5.81)
RDW: 14.4 % (ref 11.5–15.5)
WBC: 6.6 10*3/uL (ref 4.0–10.5)

## 2013-08-02 LAB — BASIC METABOLIC PANEL
BUN: 18 mg/dL (ref 6–23)
CALCIUM: 8.2 mg/dL — AB (ref 8.4–10.5)
CO2: 27 meq/L (ref 19–32)
Chloride: 95 mEq/L — ABNORMAL LOW (ref 96–112)
Creatinine, Ser: 0.87 mg/dL (ref 0.50–1.35)
GFR calc Af Amer: >90 mL/min (ref >90)
GFR, EST NON AFRICAN AMERICAN: 85 mL/min — AB (ref >90)
GLUCOSE: 110 mg/dL — AB (ref 70–99)
POTASSIUM: 3.9 meq/L (ref 3.7–5.3)
Sodium: 131 mEq/L — ABNORMAL LOW (ref 137–147)

## 2013-08-02 LAB — GRAM STAIN

## 2013-08-02 LAB — PROTIME-INR
INR: 1.11 (ref 0.00–1.49)
Prothrombin Time: 14.1 seconds (ref 11.6–15.2)

## 2013-08-02 LAB — APTT: APTT: 33 s (ref 24–37)

## 2013-08-02 SURGERY — LUMBAR WOUND DEBRIDEMENT
Anesthesia: General | Site: Back

## 2013-08-02 MED ORDER — PHENOL 1.4 % MT LIQD
1.0000 | OROMUCOSAL | Status: DC | PRN
  Administered 2013-08-04: 1 via OROMUCOSAL
  Filled 2013-08-02: qty 177

## 2013-08-02 MED ORDER — SODIUM CHLORIDE 0.9 % IJ SOLN
3.0000 mL | Freq: Two times a day (BID) | INTRAMUSCULAR | Status: DC
  Administered 2013-08-02 – 2013-08-08 (×4): 3 mL via INTRAVENOUS

## 2013-08-02 MED ORDER — PROPOFOL 10 MG/ML IV BOLUS
INTRAVENOUS | Status: DC | PRN
  Administered 2013-08-02: 120 mg via INTRAVENOUS

## 2013-08-02 MED ORDER — GLYCOPYRROLATE 0.2 MG/ML IJ SOLN
INTRAMUSCULAR | Status: DC | PRN
  Administered 2013-08-02: 0.6 mg via INTRAVENOUS

## 2013-08-02 MED ORDER — ARTIFICIAL TEARS OP OINT
TOPICAL_OINTMENT | OPHTHALMIC | Status: DC | PRN
  Administered 2013-08-02: 1 via OPHTHALMIC

## 2013-08-02 MED ORDER — ZOLPIDEM TARTRATE 5 MG PO TABS: 5.0000 mg | ORAL_TABLET | Freq: Every evening | ORAL | Status: DC | PRN

## 2013-08-02 MED ORDER — LACTATED RINGERS IV SOLN
INTRAVENOUS | Status: DC | PRN
Start: 2013-08-02 — End: 2013-08-02
  Administered 2013-08-02 (×2): via INTRAVENOUS

## 2013-08-02 MED ORDER — HYDROMORPHONE HCL PF 1 MG/ML IJ SOLN
INTRAMUSCULAR | Status: AC
  Filled 2013-08-02: qty 1

## 2013-08-02 MED ORDER — EPHEDRINE SULFATE 50 MG/ML IJ SOLN
INTRAMUSCULAR | Status: DC | PRN
  Administered 2013-08-02: 15 mg via INTRAVENOUS
  Administered 2013-08-02: 10 mg via INTRAVENOUS

## 2013-08-02 MED ORDER — ONDANSETRON HCL 4 MG/2ML IJ SOLN
4.0000 mg | INTRAMUSCULAR | Status: DC | PRN
  Administered 2013-08-03 – 2013-08-08 (×4): 4 mg via INTRAVENOUS
  Filled 2013-08-02 (×4): qty 2

## 2013-08-02 MED ORDER — FENTANYL CITRATE 0.05 MG/ML IJ SOLN
INTRAMUSCULAR | Status: DC | PRN
  Administered 2013-08-02: 50 ug via INTRAVENOUS
  Administered 2013-08-02: 100 ug via INTRAVENOUS
  Administered 2013-08-02 (×2): 50 ug via INTRAVENOUS

## 2013-08-02 MED ORDER — SODIUM CHLORIDE 0.9 % IV SOLN
INTRAVENOUS | Status: DC
  Administered 2013-08-02: 20:00:00 via INTRAVENOUS

## 2013-08-02 MED ORDER — BUPIVACAINE LIPOSOME 1.3 % IJ SUSP
20.0000 mL | Freq: Once | INTRAMUSCULAR | Status: DC
  Filled 2013-08-02: qty 20

## 2013-08-02 MED ORDER — DEXAMETHASONE 4 MG PO TABS
4.0000 mg | ORAL_TABLET | Freq: Four times a day (QID) | ORAL | Status: DC
  Administered 2013-08-03 (×2): 4 mg via ORAL
  Filled 2013-08-02 (×7): qty 1

## 2013-08-02 MED ORDER — ACETAMINOPHEN 325 MG PO TABS
650.0000 mg | ORAL_TABLET | ORAL | Status: DC | PRN
  Administered 2013-08-04: 650 mg via ORAL
  Filled 2013-08-02: qty 2

## 2013-08-02 MED ORDER — DIAZEPAM 5 MG PO TABS
5.0000 mg | ORAL_TABLET | Freq: Four times a day (QID) | ORAL | Status: DC | PRN
  Administered 2013-08-02 – 2013-08-08 (×10): 5 mg via ORAL
  Filled 2013-08-02 (×10): qty 1

## 2013-08-02 MED ORDER — HEMOSTATIC AGENTS (NO CHARGE) OPTIME
TOPICAL | Status: DC | PRN
  Administered 2013-08-02: 1 via TOPICAL

## 2013-08-02 MED ORDER — ACETAMINOPHEN 650 MG RE SUPP
650.0000 mg | RECTAL | Status: DC | PRN
Start: 2013-08-02 — End: 2013-08-08

## 2013-08-02 MED ORDER — LIDOCAINE HCL (CARDIAC) 20 MG/ML IV SOLN
INTRAVENOUS | Status: DC | PRN
  Administered 2013-08-02: 80 mg via INTRAVENOUS

## 2013-08-02 MED ORDER — HYDROMORPHONE HCL PF 1 MG/ML IJ SOLN
0.2500 mg | INTRAMUSCULAR | Status: DC | PRN
  Administered 2013-08-02 (×4): 0.5 mg via INTRAVENOUS

## 2013-08-02 MED ORDER — ONDANSETRON HCL 4 MG/2ML IJ SOLN
INTRAMUSCULAR | Status: DC | PRN
  Administered 2013-08-02: 4 mg via INTRAVENOUS

## 2013-08-02 MED ORDER — NEOSTIGMINE METHYLSULFATE 1 MG/ML IJ SOLN
INTRAMUSCULAR | Status: DC | PRN
  Administered 2013-08-02: 4 mg via INTRAVENOUS

## 2013-08-02 MED ORDER — ONDANSETRON HCL 4 MG/2ML IJ SOLN
4.0000 mg | Freq: Once | INTRAMUSCULAR | Status: DC | PRN
Start: 2013-08-02 — End: 2013-08-02

## 2013-08-02 MED ORDER — DEXAMETHASONE SODIUM PHOSPHATE 4 MG/ML IJ SOLN
4.0000 mg | Freq: Four times a day (QID) | INTRAMUSCULAR | Status: DC
  Administered 2013-08-02 – 2013-08-03 (×2): 4 mg via INTRAVENOUS
  Filled 2013-08-02 (×6): qty 1

## 2013-08-02 MED ORDER — ROCURONIUM BROMIDE 100 MG/10ML IV SOLN
INTRAVENOUS | Status: DC | PRN
  Administered 2013-08-02: 20 mg via INTRAVENOUS
  Administered 2013-08-02: 30 mg via INTRAVENOUS
  Administered 2013-08-02: 10 mg via INTRAVENOUS

## 2013-08-02 MED ORDER — PHENYLEPHRINE HCL 10 MG/ML IJ SOLN
10.0000 mg | INTRAMUSCULAR | Status: DC | PRN
  Administered 2013-08-02: 25 ug/min via INTRAVENOUS
  Administered 2013-08-02: 16:00:00 via INTRAVENOUS

## 2013-08-02 MED ORDER — PHENYLEPHRINE HCL 10 MG/ML IJ SOLN
INTRAMUSCULAR | Status: DC | PRN
  Administered 2013-08-02: 160 ug via INTRAVENOUS
  Administered 2013-08-02: 240 ug via INTRAVENOUS

## 2013-08-02 MED ORDER — DIAZEPAM 5 MG PO TABS
ORAL_TABLET | ORAL | Status: AC
  Filled 2013-08-02: qty 1

## 2013-08-02 MED ORDER — CEFAZOLIN SODIUM 1-5 GM-% IV SOLN: 1.0000 g | Freq: Three times a day (TID) | INTRAVENOUS | Status: DC

## 2013-08-02 MED ORDER — THROMBIN 5000 UNITS EX SOLR
CUTANEOUS | Status: DC | PRN
  Administered 2013-08-02 (×2): 5000 [IU] via TOPICAL

## 2013-08-02 MED ORDER — SODIUM CHLORIDE 0.9 % IJ SOLN
3.0000 mL | INTRAMUSCULAR | Status: DC | PRN
  Administered 2013-08-06: 3 mL via INTRAVENOUS

## 2013-08-02 MED ORDER — OXYCODONE-ACETAMINOPHEN 5-325 MG PO TABS
1.0000 | ORAL_TABLET | ORAL | Status: DC | PRN
  Administered 2013-08-02: 2 via ORAL
  Filled 2013-08-02: qty 2

## 2013-08-02 MED ORDER — 0.9 % SODIUM CHLORIDE (POUR BTL) OPTIME
TOPICAL | Status: DC | PRN
  Administered 2013-08-02: 1000 mL

## 2013-08-02 MED ORDER — SODIUM CHLORIDE 0.9 % IV SOLN
250.0000 mL | INTRAVENOUS | Status: DC
Start: 2013-08-02 — End: 2013-08-08

## 2013-08-02 MED ORDER — MENTHOL 3 MG MT LOZG: 1.0000 | LOZENGE | OROMUCOSAL | Status: DC | PRN

## 2013-08-02 SURGICAL SUPPLY — 59 items
BENZOIN TINCTURE PRP APPL 2/3 (GAUZE/BANDAGES/DRESSINGS) ×3 IMPLANT
BLADE SURG ROTATE 9660 (MISCELLANEOUS) IMPLANT
CANISTER SUCT 3000ML (MISCELLANEOUS) ×3 IMPLANT
CLOSURE WOUND 1/2 X4 (GAUZE/BANDAGES/DRESSINGS) ×1
DRAPE LAPAROTOMY 100X72X124 (DRAPES) ×3 IMPLANT
DRAPE MICROSCOPE ZEISS OPMI (DRAPES) ×3 IMPLANT
DRAPE POUCH INSTRU U-SHP 10X18 (DRAPES) ×3 IMPLANT
DRSG OPSITE POSTOP 4X6 (GAUZE/BANDAGES/DRESSINGS) ×3 IMPLANT
DURAPREP 26ML APPLICATOR (WOUND CARE) IMPLANT
ELECT REM PT RETURN 9FT ADLT (ELECTROSURGICAL) ×3
ELECTRODE REM PT RTRN 9FT ADLT (ELECTROSURGICAL) ×1 IMPLANT
GAUZE SPONGE 4X4 16PLY XRAY LF (GAUZE/BANDAGES/DRESSINGS) IMPLANT
GLOVE BIO SURGEON STRL SZ 6.5 (GLOVE) IMPLANT
GLOVE BIO SURGEON STRL SZ7 (GLOVE) IMPLANT
GLOVE BIO SURGEON STRL SZ7.5 (GLOVE) IMPLANT
GLOVE BIO SURGEON STRL SZ8 (GLOVE) IMPLANT
GLOVE BIO SURGEON STRL SZ8.5 (GLOVE) IMPLANT
GLOVE BIO SURGEONS STRL SZ 6.5 (GLOVE)
GLOVE BIOGEL M 8.0 STRL (GLOVE) ×6 IMPLANT
GLOVE ECLIPSE 6.5 STRL STRAW (GLOVE) IMPLANT
GLOVE ECLIPSE 7.0 STRL STRAW (GLOVE) ×3 IMPLANT
GLOVE ECLIPSE 7.5 STRL STRAW (GLOVE) ×6 IMPLANT
GLOVE ECLIPSE 8.0 STRL XLNG CF (GLOVE) IMPLANT
GLOVE ECLIPSE 8.5 STRL (GLOVE) IMPLANT
GLOVE EXAM NITRILE LRG STRL (GLOVE) ×3 IMPLANT
GLOVE EXAM NITRILE MD LF STRL (GLOVE) IMPLANT
GLOVE EXAM NITRILE XL STR (GLOVE) IMPLANT
GLOVE EXAM NITRILE XS STR PU (GLOVE) IMPLANT
GLOVE INDICATOR 6.5 STRL GRN (GLOVE) IMPLANT
GLOVE INDICATOR 7.0 STRL GRN (GLOVE) IMPLANT
GLOVE INDICATOR 7.5 STRL GRN (GLOVE) ×6 IMPLANT
GLOVE INDICATOR 8.0 STRL GRN (GLOVE) IMPLANT
GLOVE INDICATOR 8.5 STRL (GLOVE) IMPLANT
GLOVE OPTIFIT SS 8.0 STRL (GLOVE) IMPLANT
GLOVE SURG SS PI 6.5 STRL IVOR (GLOVE) IMPLANT
GOWN BRE IMP SLV AUR LG STRL (GOWN DISPOSABLE) IMPLANT
GOWN BRE IMP SLV AUR XL STRL (GOWN DISPOSABLE) IMPLANT
GOWN STRL REIN 2XL LVL4 (GOWN DISPOSABLE) IMPLANT
KIT BASIN OR (CUSTOM PROCEDURE TRAY) ×3 IMPLANT
KIT ROOM TURNOVER OR (KITS) ×3 IMPLANT
NEEDLE HYPO 21X1.5 SAFETY (NEEDLE) ×3 IMPLANT
NS IRRIG 1000ML POUR BTL (IV SOLUTION) ×3 IMPLANT
PACK LAMINECTOMY NEURO (CUSTOM PROCEDURE TRAY) ×3 IMPLANT
PAD ARMBOARD 7.5X6 YLW CONV (MISCELLANEOUS) ×9 IMPLANT
RUBBERBAND STERILE (MISCELLANEOUS) ×6 IMPLANT
SPONGE GAUZE 4X4 12PLY (GAUZE/BANDAGES/DRESSINGS) ×3 IMPLANT
SPONGE SURGIFOAM ABS GEL SZ50 (HEMOSTASIS) ×3 IMPLANT
STRIP CLOSURE SKIN 1/2X4 (GAUZE/BANDAGES/DRESSINGS) ×2 IMPLANT
SUT VIC AB 0 CT1 18XCR BRD8 (SUTURE) ×2 IMPLANT
SUT VIC AB 0 CT1 8-18 (SUTURE) ×4
SUT VIC AB 2-0 CP2 18 (SUTURE) ×6 IMPLANT
SUT VIC AB 3-0 SH 8-18 (SUTURE) ×3 IMPLANT
SWAB CULTURE LIQ STUART DBL (MISCELLANEOUS) ×6 IMPLANT
SYR 20CC LL (SYRINGE) ×3 IMPLANT
SYR 20ML ECCENTRIC (SYRINGE) ×3 IMPLANT
TOWEL OR 17X24 6PK STRL BLUE (TOWEL DISPOSABLE) ×3 IMPLANT
TOWEL OR 17X26 10 PK STRL BLUE (TOWEL DISPOSABLE) ×3 IMPLANT
TUBE ANAEROBIC SPECIMEN COL (MISCELLANEOUS) ×6 IMPLANT
WATER STERILE IRR 1000ML POUR (IV SOLUTION) ×3 IMPLANT

## 2013-08-02 NOTE — Anesthesia Preprocedure Evaluation (Addendum)
Anesthesia Evaluation  Patient identified by MRN, date of birth, ID band Patient awake    Reviewed: Allergy & Precautions, H&P , NPO status , Patient's Chart, lab work & pertinent test results  Airway Mallampati: I TM Distance: >3 FB Neck ROM: Full    Dental  (+) Teeth Intact, Dental Advisory Given and Poor Dentition   Pulmonary COPDformer smoker,          Cardiovascular hypertension, + CAD, + Cardiac Stents and + CABG     Neuro/Psych Anxiety  Neuromuscular disease    GI/Hepatic GERD-  ,  Endo/Other    Renal/GU Renal disease     Musculoskeletal   Abdominal   Peds  Hematology  (+) Blood dyscrasia, ,   Anesthesia Other Findings   Reproductive/Obstetrics                          Anesthesia Physical Anesthesia Plan  ASA: III  Anesthesia Plan: General   Post-op Pain Management:    Induction: Intravenous  Airway Management Planned: Oral ETT  Additional Equipment:   Intra-op Plan:   Post-operative Plan: Extubation in OR  Informed Consent: I have reviewed the patients History and Physical, chart, labs and discussed the procedure including the risks, benefits and alternatives for the proposed anesthesia with the patient or authorized representative who has indicated his/her understanding and acceptance.     Plan Discussed with:   Anesthesia Plan Comments:         Anesthesia Quick Evaluation

## 2013-08-02 NOTE — Preoperative (Signed)
Beta Blockers   Reason not to administer Beta Blockers:Not Applicable 

## 2013-08-02 NOTE — Anesthesia Procedure Notes (Signed)
Procedure Name: Intubation Date/Time: 08/02/2013 2:39 PM Performed by: Elon AlasLEE, Karstyn Birkey BROWN Pre-anesthesia Checklist: Patient identified, Timeout performed, Emergency Drugs available, Suction available and Patient being monitored Patient Re-evaluated:Patient Re-evaluated prior to inductionOxygen Delivery Method: Circle system utilized Preoxygenation: Pre-oxygenation with 100% oxygen Intubation Type: IV induction Ventilation: Mask ventilation without difficulty Laryngoscope Size: Mac and 3 Grade View: Grade I Tube type: Oral Tube size: 7.5 mm Number of attempts: 1 Airway Equipment and Method: Stylet Placement Confirmation: positive ETCO2,  ETT inserted through vocal cords under direct vision and breath sounds checked- equal and bilateral Secured at: 23 cm Tube secured with: Tape Dental Injury: Teeth and Oropharynx as per pre-operative assessment

## 2013-08-02 NOTE — Progress Notes (Signed)
Dr Jeral FruitBotero here---wants IV Ancef dc'd, cont IV Rocephin & Vanc, as per pre op. I did this and confirmed orders with Pharmacist Britt BottomAlvin.

## 2013-08-02 NOTE — Transfer of Care (Signed)
Immediate Anesthesia Transfer of Care Note  Patient: [name]  Procedure(s) Performed: Procedure(s): INCISION AND DRAINAGE OF LUMBAR WOUND DEBRIDEMENT (N/A)  Patient Location: PACU  Anesthesia Type:General  Level of Consciousness: awake and alert   Airway & Oxygen Therapy: Patient Spontanous Breathing and Patient connected to nasal cannula oxygen  Post-op Assessment: Report given to PACU RN and Post -op Vital signs reviewed and stable  Post vital signs: Reviewed and stable  Complications: No apparent anesthesia complications

## 2013-08-02 NOTE — Progress Notes (Signed)
Patient ID: [name], male   DOB: 09/29/1940, 73 y.o.   MRN: [id number] Patient was to have aspiration of the lesion in the lumbar area ibuprofen did talk with dr Bonnielee HaffHoss this am about which one to aim, BUT  i came to see [name] prior to the procedure to answer any question and i found him crying with pain to both legs. We agree to proceed with open exploration of the lumbar area. Im waiting for the family to come

## 2013-08-02 NOTE — Anesthesia Postprocedure Evaluation (Signed)
  Anesthesia Post-op Note  Patient: [name]  Procedure(s) Performed: Procedure(s): INCISION AND DRAINAGE OF LUMBAR WOUND DEBRIDEMENT (N/A)  Patient Location: PACU  Anesthesia Type:General  Level of Consciousness: awake and alert   Airway and Oxygen Therapy: Patient Spontanous Breathing  Post-op Pain: moderate  Post-op Assessment: Post-op Vital signs reviewed, Patient's Cardiovascular Status Stable and Respiratory Function Stable  Post-op Vital Signs: Reviewed  Filed Vitals:   08/02/13 1815  BP:   Pulse: 77  Temp:   Resp: 18    Complications: No apparent anesthesia complications

## 2013-08-02 NOTE — Progress Notes (Signed)
Care of pt assumed by MA Isiaha Greenup RN from R. Hunt RN. 

## 2013-08-03 ENCOUNTER — Encounter (HOSPITAL_COMMUNITY): Payer: Self-pay | Admitting: Neurosurgery

## 2013-08-03 DIAGNOSIS — M545 Low back pain: Secondary | ICD-10-CM | POA: Diagnosis not present

## 2013-08-03 DIAGNOSIS — R509 Fever, unspecified: Secondary | ICD-10-CM | POA: Diagnosis not present

## 2013-08-03 LAB — VANCOMYCIN, TROUGH: Vancomycin Tr: 16.1 ug/mL (ref 10.0–20.0)

## 2013-08-03 LAB — URIC ACID: URIC ACID, SERUM: 5.4 mg/dL (ref 4.0–7.8)

## 2013-08-03 MED ORDER — HEPARIN SODIUM (PORCINE) 5000 UNIT/ML IJ SOLN
5000.0000 [IU] | Freq: Three times a day (TID) | INTRAMUSCULAR | Status: DC
  Administered 2013-08-03 – 2013-08-08 (×15): 5000 [IU] via SUBCUTANEOUS
  Filled 2013-08-03 (×19): qty 1

## 2013-08-03 MED ORDER — HYDROMORPHONE HCL 2 MG PO TABS
4.0000 mg | ORAL_TABLET | ORAL | Status: DC | PRN
  Administered 2013-08-03 – 2013-08-08 (×13): 4 mg via ORAL
  Filled 2013-08-03 (×13): qty 2

## 2013-08-03 MED ORDER — ALLOPURINOL 100 MG PO TABS
100.0000 mg | ORAL_TABLET | Freq: Two times a day (BID) | ORAL | Status: DC
Start: 2013-08-03 — End: 2013-08-04
  Filled 2013-08-03 (×5): qty 1

## 2013-08-03 MED ORDER — FLEET ENEMA 7-19 GM/118ML RE ENEM: 1.0000 | ENEMA | Freq: Every day | RECTAL | Status: DC | PRN

## 2013-08-03 NOTE — Evaluation (Signed)
Occupational Therapy Evaluation Patient Details Name: [name] MRN: [id number] DOB: 12/09/1940 Today's Date: 08/03/2013 Time: [id number] OT Time Calculation (min): 27 min  OT Assessment / Plan / Recommendation History of present illness Pt is a 73 yo male whoe had initial spinal surgery in November 2014, who presents now s/p INCISION AND DRAINAGE OF LUMBAR WOUND DEBRIDEMENT.   Clinical Impression   Pt presents with below problem list. Feel pt will benefit from acute OT to increase independence prior to d/c. Recommending CIR for additional rehab.     OT Assessment  Patient needs continued OT Services    Follow Up Recommendations  CIR    Barriers to Discharge      Equipment Recommendations  Other (comment) (tbd)    Recommendations for Other Services Rehab consult  Frequency  Min 2X/week    Precautions / Restrictions Precautions Precautions: Back;Fall Precaution Booklet Issued: No Precaution Comments: Reviewed back precautions with pt. Restrictions Weight Bearing Restrictions: No   Pertinent Vitals/Pain Pain 5/10. Repositioned back in bed.     ADL  Eating/Feeding: Supervision/safety Where Assessed - Eating/Feeding: Edge of bed Grooming: Wash/dry face;Teeth care;Set up;Supervision/safety Where Assessed - Grooming:  (sitting EOB) Lower Body Bathing: +1 Total assistance Where Assessed - Lower Body Bathing: Supine, head of bed up;Supine, head of bed flat;Rolling right and/or left Lower Body Dressing: +1 Total assistance Where Assessed - Lower Body Dressing: Supine, head of bed up;Supine, head of bed flat;Rolling right and/or left Tub/Shower Transfer Method: Not assessed Transfers/Ambulation Related to ADLs: Not assessed ADL Comments: Pt performed grooming sitting EOB. OT assisted with socks as he could not cross legs over knees.     OT Diagnosis: Acute pain  OT Problem List: Decreased strength;Decreased activity tolerance;Decreased knowledge of use of DME or  AE;Decreased knowledge of precautions;Pain OT Treatment Interventions:     OT Goals(Current goals can be found in the care plan section) Acute Rehab OT Goals Patient Stated Goal: not stated OT Goal Formulation: With patient Time For Goal Achievement: 08/10/13 Potential to Achieve Goals: Good ADL Goals Pt Will Perform Lower Body Bathing: with min guard assist;with adaptive equipment;sit to/from stand Pt Will Perform Lower Body Dressing: with min guard assist;with adaptive equipment;sit to/from stand Pt Will Transfer to Toilet: with min guard assist;stand pivot transfer;bedside commode Pt Will Perform Toileting - Clothing Manipulation and hygiene: with min guard assist;sit to/from stand  Visit Information  Last OT Received On: 08/03/13 Assistance Needed: +2 PT/OT/SLP Co-Evaluation/Treatment: Yes Reason for Co-Treatment: For patient/therapist safety;Other (comment) (pt unable to tolerate multiple sessions) PT goals addressed during session: Mobility/safety with mobility OT goals addressed during session: ADL's and self-care History of Present Illness: Pt is a 73 yo male whoe had initial spinal surgery in November 2014, who presents now s/p INCISION AND DRAINAGE OF LUMBAR WOUND DEBRIDEMENT.       Prior Functioning     Home Living Family/patient expects to be discharged to:: Private residence Living Arrangements: Spouse/significant other Available Help at Discharge: Family;Available 24 hours/day Type of Home: House Home Access: Stairs to enter Entergy CorporationEntrance Stairs-Number of Steps: 1 from back entrance; 3-4 from front entrance Entrance Stairs-Rails: None Home Layout: One level Home Equipment: Walker - 2 wheels;Cane - single point;Bedside commode;Grab bars - tub/shower;Wheelchair - Civil engineer, contractingmanual;Adaptive equipment Adaptive Equipment: Reacher Additional Comments: wife has a Sports administratorreacher  Lives With: Spouse Prior Function Level of Independence: Independent Comments: prior to first back sx in  November 2014 Communication Communication: No difficulties Dominant Hand: Left (pt states he uses both for various  tasks)         Vision/Perception     Cognition  Cognition Arousal/Alertness: Awake/alert Behavior During Therapy: Anxious Overall Cognitive Status: Within Functional Limits for tasks assessed    Extremity/Trunk Assessment Upper Extremity Assessment Upper Extremity Assessment: Overall WFL for tasks assessed Lower Extremity Assessment Lower Extremity Assessment: LLE deficits/detail RLE Deficits / Details: Pt reporting decreased strength priomarily LLE RLE Sensation: decreased light touch;decreased proprioception RLE Coordination: decreased fine motor;decreased gross motor LLE Deficits / Details: Pt reporting decreased strength priomarily LLE LLE: Unable to fully assess due to pain LLE Sensation: decreased light touch;decreased proprioception LLE Coordination: decreased fine motor;decreased gross motor     Mobility Bed Mobility Overal bed mobility: Needs Assistance Bed Mobility: Rolling;Sidelying to Sit;Sit to Sidelying Rolling: Min assist Sidelying to sit: +2 for physical assistance Sit to sidelying: +2 for physical assistance General bed mobility comments: assist for trunk control and LE support and positioning during transition. Transfers General transfer comment: not assessed secondary to pain, decreased activity tolerance     Exercise     Balance Balance Sitting-balance support: Single extremity supported;Feet supported Dynamic Sitting - Comments: able to lean side to side self supporting with opposite UE. General Comments General comments (skin integrity, edema, etc.): Patient reporting increased itchiness with use of PCS, nursing aware   End of Session OT - End of Session Activity Tolerance: Patient limited by pain Patient left: in bed;with call bell/phone within reach Nurse Communication: Other (comment) (pt itching )  GO     Earlie Raveling OTR/L [phone] 08/03/2013, 1:32 PM

## 2013-08-03 NOTE — Progress Notes (Signed)
Rehab Admissions Coordinator Note:  Patient was screened by Trish MageLogue, Treavor Blomquist M for appropriateness for an Inpatient Acute Rehab Consult.  At this time, we are recommending Inpatient Rehab consult.  Trish MageLogue, Khamiyah Grefe M 08/03/2013, 3:37 PM  I can be reached at [phone].

## 2013-08-03 NOTE — Progress Notes (Signed)
Patient ID: [name], male   DOB: 09/04/1940, 73 y.o.   MRN: [id number] C/o of incisional pain and left foot. No right leg pain as preop. Do not want the iv dilaudid. Swelling of left foot with normal pulse, tenderness at palpation. 2/5 df pl weakness. Has a history of gout. To get uric acid level and to start him on allopurinol.

## 2013-08-03 NOTE — Clinical Social Work Note (Signed)
CSW received consult for possible SNF placement at the time of discharge. CSW following pt's chart and awaiting PT/OT recommendations. CSW will continue to follow.  Darlyn ChamberEmily [place], LCSWA Clinical Social Worker [phone]

## 2013-08-03 NOTE — Progress Notes (Signed)
Patient ID: [name], male   DOB: 10/12/1940, 73 y.o.   MRN: [id number] Resting. Will check in am

## 2013-08-03 NOTE — Op Note (Signed)
NAMHazle [name]:  [name]             ACCOUNT NO.:  [id number]  MEDICAL RECORD NO.:  [id number]  LOCATION:                               FACILITY:  [facility]  PHYSICIAN:  Hilda LiasErnesto Marvette Schamp, M.D.   DATE OF BIRTH:  25-Mar-1941  DATE OF PROCEDURE:  08/02/2013 DATE OF DISCHARGE:                              OPERATIVE REPORT   PREOPERATIVE DIAGNOSES:  Acute lumbar radiculopathy, rule out lumbar infection. 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  POSTOPERATIVE DIAGNOSES:  Acute lumbar radiculopathy, rule out lumbar infection. 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  PROCEDURE:  Exploration of the lumbar wound.  Removal of fibrosis. Decompression of the thecal sac in the right side, as well as the L3 and L4 nerve root.  Microscope.  SURGEON:  Hilda LiasErnesto Lenzie Sandler, M.D.  ASSISTANT:  Dr. Conchita ParisNundkumar.  CLINICAL HISTORY:  [name] is a gentleman who in the past underwent lumbar fusion at 4-5, 5-1, about 8 weeks ago, he had acute onset of right L3-L4 herniated disk, which was taken care with surgery. Nevertheless, the patient developed paraparesis with acute urinary retention.  The patient was in the intensive care intensive unit, and eventually he was discharged home with home physical therapy.  He was admitted a few days ago with sudden onset of more pain in the right leg, low-grade fever.  An MRI was done and it was read as a possibility of infection mostly at the level of L3-L4.  The radiologist felt that it was a localized abscess.  Infectious Disease fully agreed, and they started him on antibiotics.  Today, he was to have needle biopsy, but developed more pain, bilaterally.  Because of that, we cancelled the needle biopsy and proceeded with surgery.  I talked to him and his wife prior to surgery.  PROCEDURE:  The patient was taken to the OR.  After intubation, he was positioned in a prone manner.  The back was cleaned with Betadine  and later on with DuraPrep.  Midline incision in the upper part of the previous incision was made through the skin, subcutaneous tissues.  The skin and the muscle were cleaned, and there was no evidence of any infection. Upon opening the lower part, we found some seroma.  Some specimen was taken and sent for culture.  We started our dissection in the midline, and then because of the most of the finding on the MRI were to the right side, we started doing decompression of the right side.  We took several x-rays, and at the end, we were able to localize ourselves between the lower part of L2 and the upper part of L3-L4.  The patient had quite a bit of fibrosis.  Lysis of adhesion was done.  We found the right side was with previous DuraSeal, which was localized and displacing part of the thecal sac. Nevertheless, we feel a bone below and there was no evidence of any stenosis that we can see or feel.  Using microdissection technique, we were able to decompress the thecal sac.  Specimen was sent to the lab from that area. At the end, we had plenty of space for the thecal sac as  well as the L3- L4 nerve root.  Valsalva maneuver twice was negative.  The irrigation was done.  Hemostasis was performed, and the wound was closed with Vicryl and Steri-Strips.          ______________________________ Hilda Lias, M.D.     EB/MEDQ  D:  08/02/2013  T:  08/03/2013  Job:  [id number]

## 2013-08-03 NOTE — Evaluation (Signed)
Physical Therapy Evaluation Patient Details Name: [name] MRN: [id number] DOB: 1941/03/07 Today's Date: 08/03/2013 Time: 1191-4782 PT Time Calculation (min): 27 min  PT Assessment / Plan / Recommendation History of Present Illness  Pt is a 73 yo male whoe had initial spinal surgery in November 2014, who presents now s/p INCISION AND DRAINAGE OF LUMBAR WOUND DEBRIDEMENT.  Clinical Impression  Patient demonstrates deficits in functional mobility as indicated below. Will benefit from skilled Pt to address deficits and maximize function. Will continue to see as indicated and progress activity as tolerated.     PT Assessment  Patient needs continued PT services    Follow Up Recommendations  CIR          Equipment Recommendations  None recommended at this time   Recommendations for Other Services Rehab consult   Frequency Min 5X/week    Precautions / Restrictions Precautions Precautions: Back;Fall Precaution Booklet Issued: No Precaution Comments: Reviewed back precautions with pt. Restrictions Weight Bearing Restrictions: No   Pertinent Vitals/Pain 5/10      Mobility  Ambulation/Gait General Gait Details: not assessed secondary to pain, decreased activity tolerance.      08/03/13 1000  Bed Mobility  Overal bed mobility Needs Assistance;+2 for physical assistance  Bed Mobility Rolling;Sidelying to Sit;Sit to Sidelying  Rolling Min assist  Sidelying to sit +2 for physical assistance  Sit to sidelying +2 for physical assistance  General bed mobility comments assist for trunk control and LE support and positioning during transition.     PT Diagnosis: Difficulty walking;Generalized weakness  PT Problem List: Decreased strength;Decreased activity tolerance;Decreased balance;Decreased mobility;Decreased coordination;Decreased knowledge of use of DME;Decreased knowledge of precautions;Pain PT Treatment Interventions: DME instruction;Gait training;Functional mobility  training;Therapeutic activities;Therapeutic exercise;Neuromuscular re-education;Balance training;Patient/family education     PT Goals(Current goals can be found in the care plan section) Acute Rehab PT Goals Patient Stated Goal: to get better PT Goal Formulation: With patient Time For Goal Achievement: 08/17/13 Potential to Achieve Goals: Good  Visit Information  Last PT Received On: 08/03/13 Assistance Needed: +2 PT/OT/SLP Co-Evaluation/Treatment: Yes Reason for Co-Treatment: For patient/therapist safety;Other (comment) (pt unable to tolerate multiple sessions) PT goals addressed during session: Mobility/safety with mobility History of Present Illness: Pt is a 73 yo male whoe had initial spinal surgery in November 2014, who presents now s/p INCISION AND DRAINAGE OF LUMBAR WOUND DEBRIDEMENT.       Prior Functioning  Home Living Family/patient expects to be discharged to:: Private residence Living Arrangements: Spouse/significant other Available Help at Discharge: Family;Available 24 hours/day Type of Home: House Home Access: Stairs to enter [employer] of Steps: 1 from back entrance; 3-4 from front entrance Entrance Stairs-Rails: None Home Layout: One level Home Equipment: Walker - 2 wheels;Cane - single point;Bedside commode;Grab bars - tub/shower;Wheelchair - manual Additional Comments: wife has a Sports administrator  Lives With: Spouse Prior Function Level of Independence: Independent Comments: prior to first back sx in November 2014 Communication Communication: No difficulties Dominant Hand: Left (patient states he uses both for various tasks)    Cognition  Cognition Arousal/Alertness: Awake/alert Behavior During Therapy: Anxious Overall Cognitive Status: Within Functional Limits for tasks assessed    Extremity/Trunk Assessment Upper Extremity Assessment Upper Extremity Assessment: Defer to OT evaluation Lower Extremity Assessment Lower Extremity Assessment: LLE  deficits/detail RLE Deficits / Details: Pt reporting decreased strength priomarily LLE RLE Sensation: decreased light touch;decreased proprioception RLE Coordination: decreased fine motor;decreased gross motor LLE Deficits / Details: Pt reporting decreased strength priomarily LLE LLE: Unable to fully  assess due to pain LLE Sensation: decreased light touch;decreased proprioception LLE Coordination: decreased fine motor;decreased gross motor   Balance Dynamic Sitting Balance Dynamic Sitting - Comments: able to lean side to side self supporting with opposite UE.  End of Session PT - End of Session Equipment Utilized During Treatment: Oxygen Activity Tolerance: Patient limited by pain Patient left: in bed;with call bell/phone within reach;with family/visitor present Nurse Communication: Mobility status  GP     Fabio AsaWerner, Stoy Fenn J 08/03/2013, 10:58 AM Charlotte Crumbevon Hassan [place], PT DPT  [phone]

## 2013-08-03 NOTE — Progress Notes (Signed)
ANTIBIOTIC CONSULT NOTE - INITIAL  Pharmacy Consult for Rocephin/Vanco Indication: Lumbar wound  Allergies  Allergen Reactions  . Morphine Other (See Comments)    REACTION: made him go crazy; "I had fun w/it; my family didn't think it was too funny"  . Metoprolol Other (See Comments)    REACTION:  Tachycardia; "don't remember how bad it was; it was so long ago"    Patient Measurements: Height: 6' (182.9 cm) Weight: 179 lb 14.4 oz (81.602 kg) IBW/kg (Calculated) : 77.6 Adjusted Body Weight:    Vital Signs: Temp: 98 F (36.7 C) (01/07 0924) Temp src: Oral (01/07 0924) BP: 129/74 mmHg (01/07 0924) Pulse Rate: 75 (01/07 0924) Intake/Output from previous day: 01/06 0701 - 01/07 0700 In: 1750 [P.O.:50; I.V.:1700] Out: 3150 [Urine:3050; Blood:100] Intake/Output from this shift:    Labs:  Recent Labs  08/02/13 0525  WBC 6.6  HGB 9.2*  PLT 213  CREATININE 0.87   Estimated Creatinine Clearance: 86.7 ml/min (by C-G formula based on Cr of 0.87).  Recent Labs  08/01/13 1010 08/03/13 1055  VANCOTROUGH 12.7 16.1     Microbiology: Recent Results (from the past 720 hour(s))  CULTURE, BLOOD (ROUTINE X 2)     Status: None   Collection Time    07/30/13 10:10 AM      Result Value Range Status   Specimen Description BLOOD LEFT ARM   Final   Special Requests BOTTLES DRAWN AEROBIC ONLY 10CC   Final   Culture  Setup Time     Final   Value: 07/30/2013 18:10     [facility]formed [facility]   Culture     Final   Value:        BLOOD CULTURE RECEIVED NO GROWTH TO DATE CULTURE WILL BE HELD FOR 5 DAYS BEFORE ISSUING A FINAL NEGATIVE REPORT     Performed at [facility]   Report Status PENDING   Incomplete  CULTURE, BLOOD (ROUTINE X 2)     Status: None   Collection Time    07/30/13 10:15 AM      Result Value Range Status   Specimen Description BLOOD LEFT HAND   Final   Special Requests BOTTLES DRAWN AEROBIC ONLY 10CC   Final   Culture  Setup Time     Final   Value: 07/30/2013 18:10     Performed at [facility]   Culture     Final   Value:        BLOOD CULTURE RECEIVED NO GROWTH TO DATE CULTURE WILL BE HELD FOR 5 DAYS BEFORE ISSUING A FINAL NEGATIVE REPORT     Performed at [facility]   Report Status PENDING   Incomplete  ANAEROBIC CULTURE     Status: None   Collection Time    08/02/13  4:06 PM      Result Value Range Status   Specimen Description WOUND BACK   Final   Special Requests PT ON VANCO ROCEPHIN   Final   Gram Stain     Final   Value: MODERATE WBC PRESENT,BOTH PMN AND MONONUCLEAR     NO ORGANISMS SEEN     [facility]     [facility]formed at [facility]   Culture     Final   Value: NO ANAEROBES ISOLATED; CULTURE IN PROGRESS FOR 5 DAYS     Performed at [facility]   Report Status PENDING   Incomplete  WOUND CULTURE  Status: None   Collection Time    08/02/13  4:06 PM      Result Value Range Status   Specimen Description WOUND BACK   Final   Special Requests PT ON VANCO ROCEPHIN   Final   Gram Stain     Final   Value: MODERATE WBC PRESENT,BOTH PMN AND MONONUCLEAR     NO ORGANISMS SEEN     Performed at [facility]     Performed at [facility]   Culture     Final   Value: NO GROWTH 1 DAY     Performed at [facility]   Report Status PENDING   Incomplete  GRAM STAIN     Status: None   Collection Time    08/02/13  4:06 PM      Result Value Range Status   Specimen Description WOUND BACK   Final   Special Requests PT ON VANCO ROCEPHIN   Final   Gram Stain     Final   Value: MODERATE WBC PRESENT,BOTH PMN AND MONONUCLEAR     NO ORGANISMS SEEN   Report Status 08/02/2013 FINAL   Final    Medical History: Past Medical History  Diagnosis Date  . Hyperlipidemia   . Small bowel problem     HAD ALOT OF SCAR TISSUE FROM PREVIOUS SURGERIES..NG WAS INSERTED ...Marland KitchenMarland KitchenNO SURGERY NEEDED...Marland KitchenMarland KitchenIN FOR 8 DAYS  . Disorder of blood     BEEN TREATED BY DERMATOLOGIST  X 4 YRS..."BLOOD BLISTERS"  . Arthritis   . Gout   . Anxiety   . Hx MRSA infection     rt shoulder  . Pneumonia     "I've had it 3-4 times"  . Coronary artery disease     IN 2000   STENT PLACED IN 2012 sees Dr. Dietrich Pates, saw last 2013  . HTN (hypertension)     sees [name] in [place]  . Small bowel obstruction   . Ventral hernia   . Acute renal failure 04/17/2013  . 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 atorvastatin (LIPITOR) 40 MG tablet Take 1 tablet (40 mg total) by mouth daily.  90 tablet  2  . bethanechol (URECHOLINE) 50 MG tablet Take 1 tablet (50 mg total) by mouth 4 (four) times daily.  120 tablet  1  . citalopram (CELEXA) 40 MG tablet Take 40 mg by mouth daily.      . Cyanocobalamin (VITAMIN B 12 PO) Take 1 tablet by mouth daily.      Marland Kitchen doxazosin (CARDURA) 4 MG tablet Take 8 mg by mouth at bedtime.      . gabapentin (NEURONTIN) 300 MG capsule Take 1 capsule (300 mg total) by mouth 3 (three) times daily.  90 capsule  1  . oxyCODONE (ROXICODONE) 15 MG immediate release tablet Take 7.5 mg by mouth every 4 (four) hours as needed. For pain      . predniSONE (DELTASONE) 5 MG tablet Take 5 mg by mouth daily.      . tamsulosin (FLOMAX) 0.4 MG CAPS capsule Take 1 capsule (0.4 mg total) by mouth daily after breakfast.  30 capsule  1  . vitamin C (ASCORBIC ACID) 500 MG tablet Take 500 mg by mouth daily.      Marland Kitchen VITAMIN D, CHOLECALCIFEROL, PO Take 2-3 tablets by mouth daily.      Marland Kitchen VITAMIN E  PO Take 2-3 tablets by mouth daily.      . nitroGLYCERIN (NITROSTAT) 0.4 MG SL tablet Place 0.4 mg under the tongue every 5 (five) minutes as needed. For chest pain       Assessment: 73 y/o M with recent back surgery 6 weeks ago presents with pain in the groin area, low back and dysuria.07/29/13 MRI:1. 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 2. Additional loculated abscesses within the laminectomy site at L3-4 and L4-5 as detailed above. Vanc trough now at goat 16.1.   Goal of Therapy:  Vancomycin trough level 15-20 mcg/ml  Plan:   Cont vanc to 1g IV q12 Cont Rocephin to 2g IV q24

## 2013-08-03 NOTE — Progress Notes (Signed)
[facility]    Date of Admission:  07/29/2013   Total days of antibiotics 5        Day 5 ceftriaxone        Day 5 rifampin        Day 5 vanco   ID: [name] is a 73 y.o. male  with L3-S1 fusion on 11/21 c/b durotomy and cauda equina syndrome presents with worsening low back pain and fevers. MRI concerning for epidural abscess  Active Problems:   Post-operative pain   Lumbar degenerative disc disease    Subjective: Went to OR for I x D yesterday. Cultures sent. Remains afebrile, still having significant back pain  Medications:  . bethanechol  50 mg Oral QID  . bupivacaine liposome  20 mL Infiltration Once  . cefTRIAXone (ROCEPHIN)  IV  2 g Intravenous Q24H  . citalopram  40 mg Oral Daily  . dexamethasone  4 mg Intravenous Q6H   Or  . dexamethasone  4 mg Oral Q6H  . doxazosin  8 mg Oral QHS  . gabapentin  300 mg Oral TID  . HYDROmorphone PCA 0.3 mg/mL   Intravenous Q4H  . rifampin  300 mg Oral Q12H  . sodium chloride  3 mL Intravenous Q12H  . tamsulosin  0.4 mg Oral QPC breakfast  . vancomycin  1,000 mg Intravenous Q12H  . vitamin C  500 mg Oral Daily    Objective: Vital signs in last 24 hours: Temp:  [97.3 F (36.3 C)-98.3 F (36.8 C)] 98 F (36.7 C) (01/07 0924) Pulse Rate:  [9-112] 75 (01/07 0924) Resp:  [16-24] 19 (01/07 0924) BP: (129-156)/(54-74) 129/74 mmHg (01/07 0924) SpO2:  [92 %-100 %] 93 % (01/07 0924) FiO2 (%):  [85 %] 85 % (01/06 1323)  Physical Exam  Constitutional: He is oriented to person, place, and time. He appears well-developed and well-nourished. No distress.  HENT:  Mouth/Throat: Oropharynx is clear and moist. No oropharyngeal exudate.  Cardiovascular: Normal rate, regular rhythm and normal heart sounds. Exam reveals no gallop and no friction rub.  No murmur heard.  Pulmonary/Chest: Effort normal and breath sounds normal. No respiratory distress. He has no wheezes.  Abdominal: Soft. Bowel sounds are  normal. He exhibits no distension. There is no tenderness.  Lymphadenopathy:  He has no cervical adenopathy.  Neurological: He is alert and oriented to person, place, and time. Lower extremity weakness. Sensitive to moving lower extremity ? neuropathy  Skin: Skin is warm and dry. No rash noted. No erythema.      Lab Results  Recent Labs  08/02/13 0525  WBC 6.6  HGB 9.2*  HCT 27.5*  NA 131*  K 3.9  CL 95*  CO2 27  BUN 18  CREATININE 0.87   Lab Results  Component Value Date   ESRSEDRATE 23* 07/30/2013   Lab Results  Component Value Date   CRP 2.4* 07/30/2013    Microbiology: 1/6 or culture 1/3 blood culture NGTD  Studies/Results: Dg Lumbar Spine Complete  08/02/2013   CLINICAL DATA:  Wound drainage.  EXAM: LUMBAR SPINE - COMPLETE 4+ VIEW  COMPARISON:  Lumbar MRI 07/29/2013  FINDINGS: Four lateral views in the operating room were obtained. Pedicle screw fusion L4-5 and L5-S1. Interbody spacer L4-5. Hardware is unchanged on all the images.  Image number 1 reveals surgical instruments posterior to the spinal canal at the L2-3 level.  Image number 2 reveals a surgical instrument at the laminectomy defect at L3-4.  Image number 3 reveals advancement of the instrument overlying the posterior canal at L3-4.  Image number 4 reveals an instrument overlying the canal just below the L3 pedicle.  IMPRESSION: Lumbar localization as above.   Electronically Signed   By: Marlan Palauharles  Clark M.D.   On: 08/02/2013 16:22     Assessment/Plan: 73yo M with L3-S1 fusion on 11/21 c/b durotomy and cauda equina syndrome presents with worsening low back pain and fevers. MRI concerning for epidural abscess - continue on vanco, ceftriaxone, and rifampin. vanco at goal of 15-20. - await OR culture results  [facility] Center[facility], [facility] Cell: [phone] Pager: [phone]  08/03/2013, 11:28 AM

## 2013-08-04 ENCOUNTER — Inpatient Hospital Stay (HOSPITAL_COMMUNITY): Payer: Medicare Other

## 2013-08-04 DIAGNOSIS — M48061 Spinal stenosis, lumbar region without neurogenic claudication: Secondary | ICD-10-CM

## 2013-08-04 DIAGNOSIS — R0989 Other specified symptoms and signs involving the circulatory and respiratory systems: Secondary | ICD-10-CM | POA: Diagnosis not present

## 2013-08-04 DIAGNOSIS — G8220 Paraplegia, unspecified: Secondary | ICD-10-CM

## 2013-08-04 LAB — WOUND CULTURE: Culture: NO GROWTH

## 2013-08-04 LAB — CBC WITH DIFFERENTIAL/PLATELET
Basophils Absolute: 0 10*3/uL (ref 0.0–0.1)
Basophils Relative: 0 % (ref 0–1)
EOS PCT: 7 % — AB (ref 0–5)
Eosinophils Absolute: 0.5 10*3/uL (ref 0.0–0.7)
HCT: 28 % — ABNORMAL LOW (ref 39.0–52.0)
HEMOGLOBIN: 9.4 g/dL — AB (ref 13.0–17.0)
LYMPHS ABS: 0.9 10*3/uL (ref 0.7–4.0)
Lymphocytes Relative: 10 % — ABNORMAL LOW (ref 12–46)
MCH: 29.7 pg (ref 26.0–34.0)
MCHC: 33.6 g/dL (ref 30.0–36.0)
MCV: 88.6 fL (ref 78.0–100.0)
MONOS PCT: 10 % (ref 3–12)
Monocytes Absolute: 0.9 10*3/uL (ref 0.1–1.0)
NEUTROS PCT: 73 % (ref 43–77)
Neutro Abs: 6 10*3/uL (ref 1.7–7.7)
Platelets: 299 10*3/uL (ref 150–400)
RBC: 3.16 MIL/uL — AB (ref 4.22–5.81)
RDW: 14 % (ref 11.5–15.5)
WBC: 8.3 10*3/uL (ref 4.0–10.5)

## 2013-08-04 LAB — URINALYSIS, ROUTINE W REFLEX MICROSCOPIC
BILIRUBIN URINE: NEGATIVE
GLUCOSE, UA: NEGATIVE mg/dL
Ketones, ur: NEGATIVE mg/dL
Nitrite: NEGATIVE
PH: 5 (ref 5.0–8.0)
Protein, ur: NEGATIVE mg/dL
SPECIFIC GRAVITY, URINE: 1.015 (ref 1.005–1.030)
Urobilinogen, UA: 0.2 mg/dL (ref 0.0–1.0)

## 2013-08-04 LAB — URINE MICROSCOPIC-ADD ON

## 2013-08-04 LAB — BASIC METABOLIC PANEL
BUN: 9 mg/dL (ref 6–23)
CO2: 27 mEq/L (ref 19–32)
Calcium: 8.6 mg/dL (ref 8.4–10.5)
Chloride: 97 mEq/L (ref 96–112)
Creatinine, Ser: 0.91 mg/dL (ref 0.50–1.35)
GFR calc Af Amer: >90 mL/min (ref >90)
GFR calc non Af Amer: 84 mL/min — ABNORMAL LOW (ref >90)
GLUCOSE: 94 mg/dL (ref 70–99)
POTASSIUM: 3.9 meq/L (ref 3.7–5.3)
SODIUM: 136 meq/L — AB (ref 137–147)

## 2013-08-04 LAB — INFLUENZA PANEL BY PCR (TYPE A & B)
H1N1FLUPCR: NOT DETECTED
Influenza A By PCR: NEGATIVE
Influenza B By PCR: NEGATIVE

## 2013-08-04 MED ORDER — GABAPENTIN 400 MG PO CAPS
400.0000 mg | ORAL_CAPSULE | Freq: Three times a day (TID) | ORAL | Status: DC
  Administered 2013-08-04 – 2013-08-05 (×3): 400 mg via ORAL
  Filled 2013-08-04 (×4): qty 1

## 2013-08-04 NOTE — Progress Notes (Signed)
Physical Therapy Treatment Patient Details Name: Ignacia MarvelRonald W Lemma MRN: [id number] DOB: 05/22/1941 Today's Date: 08/04/2013 Time: [id number] PT Time Calculation (min): 49 min  PT Assessment / Plan / Recommendation  History of Present Illness Pt is a 73 yo male whoe had initial spinal surgery in November 2014, who presents now s/p INCISION AND DRAINAGE OF LUMBAR WOUND DEBRIDEMENT.   PT Comments   Patient severly limited by pain today, attempted OOB to chair, upon reaching chair, patient had a BM but did not recognize he was having one. Patient unable to tolerate stand for hygiene, had to transfer back to bed and perform bed level rolling for pericare and hygiene. Patient nauseated, reports 10/10 pain across back and left leg.   Follow Up Recommendations  CIR           Equipment Recommendations  Rolling walker with 5" wheels    Recommendations for Other Services Rehab consult  Frequency Min 5X/week   Progress towards PT Goals Progress towards PT goals: Not progressing toward goals - comment (severely limited by pain)  Plan Current plan remains appropriate    Precautions / Restrictions Precautions Precautions: Back;Fall Precaution Booklet Issued: No Precaution Comments: Reviewed back precautions with pt. Restrictions Weight Bearing Restrictions: No   Pertinent Vitals/Pain 10/10 pain with mobilty LLE,     Mobility  Bed Mobility Overal bed mobility: Needs Assistance Bed Mobility: Rolling;Sidelying to Sit;Sit to Sidelying Rolling: Min assist Sidelying to sit: +2 for physical assistance Sit to sidelying: +2 for physical assistance Transfers Overall transfer level: Needs assistance Equipment used: Rolling walker (2 wheeled) Transfers: Sit to/from [employer]GI CorporationStand;Stand Pivot Transfers Sit to Stand: +2 physical assistance Stand pivot transfers: +2 physical assistance General transfer comment: pt assisted OOB to chair, pt had BM during transfer (unable to recognize) attemtped to stand for  hygiene, unable to mantain standing position for >3 seconds before writing back to chair in pain, at to perform total transfer to bed and assist with hygiene and pericare.       PT Goals (current goals can now be found in the care plan section) Acute Rehab PT Goals Patient Stated Goal: not stated PT Goal Formulation: With patient Time For Goal Achievement: 08/17/13 Potential to Achieve Goals: Good  Visit Information  Last PT Received On: 08/04/13 Assistance Needed: +2 History of Present Illness: Pt is a 73 yo male whoe had initial spinal surgery in November 2014, who presents now s/p INCISION AND DRAINAGE OF LUMBAR WOUND DEBRIDEMENT.    Subjective Data  Patient Stated Goal: not stated   Cognition  Cognition Arousal/Alertness: Awake/alert Behavior During Therapy: Anxious Overall Cognitive Status: Within Functional Limits for tasks assessed    Balance  Balance Overall balance assessment: Needs assistance  End of Session PT - End of Session Equipment Utilized During Treatment: Gait belt Activity Tolerance: Patient limited by pain Patient left: in bed;with call bell/phone within reach;with nursing/sitter in room Nurse Communication: Mobility status;Need for lift equipment;Patient requests pain meds;Other (comment) (pericare, +BM without recognition, Nauseated)   GP     Fabio AsaWerner, Fynley Chrystal J 08/04/2013, 11:38 AM Charlotte Crumbevon Rosann Gorum, PT DPT  [phone]

## 2013-08-04 NOTE — Progress Notes (Signed)
[facility]    Date of Admission:  07/29/2013   Total days of antibiotics 6        Day 6 ceftriaxone        Day 6 rifampin        Day 6 vanco   ID: [name] is a 73 y.o. male  with L3-S1 fusion on 11/21 c/b durotomy and cauda equina syndrome presents with worsening low back pain and fevers. MRI concerning for epidural abscess POD#2 for I x D. Cultures negative  Active Problems:   Post-operative pain   Lumbar degenerative disc disease    Subjective: Feeling poorly febrile to 100.8, diaphoretic, cough and back pain with physical therapy  Medications:  . bethanechol  50 mg Oral QID  . bupivacaine liposome  20 mL Infiltration Once  . cefTRIAXone (ROCEPHIN)  IV  2 g Intravenous Q24H  . citalopram  40 mg Oral Daily  . doxazosin  8 mg Oral QHS  . gabapentin  300 mg Oral TID  . heparin subcutaneous  5,000 Units Subcutaneous Q8H  . rifampin  300 mg Oral Q12H  . sodium chloride  3 mL Intravenous Q12H  . tamsulosin  0.4 mg Oral QPC breakfast  . vancomycin  1,000 mg Intravenous Q12H  . vitamin C  500 mg Oral Daily    Objective: Vital signs in last 24 hours: Temp:  [98.2 F (36.8 C)-100.8 F (38.2 C)] 99.5 F (37.5 C) (01/08 1703) Pulse Rate:  [76-83] 76 (01/08 1703) Resp:  [18-20] 20 (01/08 1703) BP: (126-161)/(48-67) 153/60 mmHg (01/08 1703) SpO2:  [96 %-97 %] 97 % (01/08 1703)  Physical Exam  Constitutional: He is oriented to person, place, and time. He appears well-developed and well-nourished. No distress.  HENT:  Mouth/Throat: Oropharynx is clear and moist. No oropharyngeal exudate.  Cardiovascular: Normal rate, regular rhythm and normal heart sounds. Exam reveals no gallop and no friction rub.  No murmur heard.  Pulmonary/Chest: Effort normal and breath sounds normal. No respiratory distress. He has no wheezes.  Abdominal: Soft. Bowel sounds are normal. He exhibits no distension. There is no tenderness.  Lymphadenopathy:  He has no  cervical adenopathy.  Neurological: He is alert and oriented to person, place, and time. Lower extremity weakness. Sensitive to moving lower extremity ? neuropathy  Skin: Skin is warm and dry. No rash noted. No erythema.      Lab Results  Recent Labs  08/02/13 0525  WBC 6.6  HGB 9.2*  HCT 27.5*  NA 131*  K 3.9  CL 95*  CO2 27  BUN 18  CREATININE 0.87   Lab Results  Component Value Date   ESRSEDRATE 23* 07/30/2013   Lab Results  Component Value Date   CRP 2.4* 07/30/2013    Microbiology: 1/6 or culture 1/3 blood culture NGTD  Studies/Results: Dg Chest Port 1 View  08/04/2013   CLINICAL DATA:  Cough and fever  EXAM: PORTABLE CHEST - 1 VIEW  COMPARISON:  07/31/2013  FINDINGS: Previous CABG procedure. Normal heart size. Again noted is volume loss at the right lung base with blunting of the right costophrenic angle. This is a chronic abnormality and appears stable from 12/12/2011. No left pleural effusion identified. Pulmonary vascular congestion is noted. Anterior sideplate is identified within the lower cervical and upper thoracic spine.  IMPRESSION: 1. Persistent pulmonary vascular congestion. 2. No pneumonia.   Electronically Signed   By: Signa Kell M.D.   On: 08/04/2013 16:45  Assessment/Plan: 73yo M with L3-S1 fusion on 11/21 c/b durotomy and cauda equina syndrome presents with worsening low back pain and fevers. MRI concerning for epidural abscess POD#2 from I X D, cultures negative thus far - continue on vanco, ceftriaxone, and rifampin. vanco at goal of 15-20. - will repeat blood cx, check ua, cxr. And cbc with diff - flu is negative. No need for droplet.  Drue SecondSNIDER, [facility] Cell: [phone] Pager: [phone]  08/04/2013, 6:06 PM

## 2013-08-04 NOTE — Progress Notes (Signed)
Patient ID: [name], male   DOB: 06/04/1941, 73 y.o.   MRN: [id number] Low grade temp. Burning pain in left foot, swelling. Uric acid wnl. C/o incisional pain. Wet rales in both lungs. To test for the flu. i will get the hospitalist to help with his care

## 2013-08-04 NOTE — Progress Notes (Signed)
I will follow pt's progress with therapy to hopefully admit to inpt rehab when tolerance improved. Previously did well in 11/14 rehab admission. [phone]

## 2013-08-04 NOTE — Progress Notes (Signed)
Patient ID: [name], male   DOB: 07/02/1941, 73 y.o.   MRN: [id number] So far all cultures are negative. Uric acid level, wnl. Chest xray shows old changes in the lung. C/o incisional pain. No pain in right leg but sensitive pain in left foot with 0/5 df and 1/5 pf, edema. No evidence of dvt. Normal pulse. C/o pain left shoulder. Had shoulder  [place]filtration in [place]  Increase gabapentin

## 2013-08-04 NOTE — Consult Note (Signed)
Physical Medicine and Rehabilitation Consult Reason for Consult: Acute lumbar radiculopathy Referring Physician: Dr. Jeral Fruit   HPI: [name] is a 73 y.o. right-handed male well known to rehabilitation services recently admitted inpatient rehabilitation services 06/22/2013 until 07/08/2013 cauda equina syndrome and had undergone lumbar L4-S1 decompression complicated by CSF leak and some urinary retention. He was discharged to home ambulating 40 feet with minimal assistance using a walker. Presented 07/30/2013 with progressive low back pain over the last 3-4 days. MRI of the lumbar spine showed epidural fluid collection and some ring-enhancing fluid collections in the soft tissues. He was placed on broad-spectrum antibiotics and received followed by infectious disease and a questionable abscess. Patient underwent exploration of lumbar wound with removal of fibrosis and decompression of the thecal sac and the right side as well as lumbar L3 and L5 nerve root 08/03/2013 per Dr. Jeral Fruit. Postoperative pain management. Presently maintained on Rocephin and vancomycin. Latest wound culture shows no growth. Physical and occupational therapy evaluations completed with recommendations of physical medicine rehabilitation consult to consider inpatient rehabilitation services  Patient complains of hypersensitivity and inability to bear weight on left foot since surgery yesterday Patient has a Foley. Remembers me from prior to admission  Review of Systems  Genitourinary:       Urinary retention  Musculoskeletal: Positive for back pain and joint pain.  Psychiatric/Behavioral:       Anxiety  All other systems reviewed and are negative.   Past Medical History  Diagnosis Date  . Hyperlipidemia   . Small bowel problem     HAD ALOT OF SCAR TISSUE FROM PREVIOUS SURGERIES..NG WAS INSERTED ...Marland KitchenMarland KitchenNO SURGERY NEEDED...Marland KitchenMarland KitchenIN FOR 8 DAYS  . Disorder of blood     BEEN TREATED BY DERMATOLOGIST X 4 YRS..."BLOOD  BLISTERS"  . Arthritis   . Gout   . Anxiety   . Hx MRSA infection     rt shoulder  . Pneumonia     "I've had it 3-4 times"  . Coronary artery disease     IN 2000   STENT PLACED IN 2012 sees Dr. Dietrich Pates, saw last 2013  . HTN (hypertension)     sees [name] in [place]  . Small bowel obstruction   . Ventral hernia   . Acute renal failure 04/17/2013  . 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 Sternal surg  2000    HAS HAD 5-6 ON HIS STERNUM; "caught MRSA in it"  . Lumbar laminectomy/decompression microdiscectomy  06/13/2011    Procedure: LUMBAR LAMINECTOMY/DECOMPRESSION MICRODISCECTOMY;  Surgeon: Karn Cassis;  Location: [facility];  Service: Neurosurgery;  Laterality: N/A;  Lumbar three, lumbar four-five Laminectomy  . Eye surgery      bilateral cataract  . Anterior cervical corpectomy  12/17/11  . Incision and drainage of wound  ~ 20ll; 12/03/11    "had infection in my right"  . Shoulder open rotator cuff repair  ~ 2011    right  . Peripherally inserted central catheter insertion  2011 & 11/2011  . Cataract extraction w/ intraocular lens  implant, bilateral  ? 2011  . Coronary artery bypass graft  2000    CABG X5  . Back surgery      lumbar  . Tonsillectomy  1949  . Cholecystectomy  2006 "or after"  . Coronary angioplasty with stent placement  2012  . Anterior cervical corpectomy  12/17/2011    Procedure: ANTERIOR  CERVICAL CORPECTOMY;  Surgeon: Karn Cassis, MD;  Location: [facility];  Service: Neurosurgery;  Laterality: N/A;  Anterior Cervical Decompression Fusion Five to Thoracic Two with plating  . Lumbar laminectomy/decompression microdiscectomy Right 06/17/2013    Procedure: LUMBAR LAMINECTOMY/DECOMPRESSION MICRODISCECTOMY 1 LEVEL;  Surgeon: Karn Cassis, MD;  Location: [facility];  Service: Neurosurgery;  Laterality: Right;  Right L3-4 Microdiskectomy  . Lumbar wound debridement N/A  08/02/2013    Procedure: INCISION AND DRAINAGE OF LUMBAR WOUND DEBRIDEMENT;  Surgeon: Karn Cassis, MD;  Location: [facility];  Service: Neurosurgery;  Laterality: N/A;   Family History  Problem Relation Age of Onset  . Heart disease    . Hypertension Mother   . Hypertension Maternal Aunt   . Hypertension Maternal Uncle   . Anesthesia problems Neg Hx   . Hypotension Neg Hx   . Malignant hyperthermia Neg Hx   . Pseudochol deficiency Neg Hx    Social History:  reports that he quit smoking about 14 years ago. His smoking use included Cigarettes. He has a 1 pack-year smoking history. He has never used smokeless tobacco. He reports that he does not drink alcohol or use illicit drugs. Allergies:  Allergies  Allergen Reactions  . Morphine Other (See Comments)    REACTION: made him go crazy; "I had fun w/it; my family didn't think it was too funny"  . Metoprolol Other (See Comments)    REACTION:  Tachycardia; "don't remember how bad it was; it was so long ago"   Medications Prior to Admission  Medication Sig Dispense Refill  . atorvastatin (LIPITOR) 40 MG tablet Take 1 tablet (40 mg total) by mouth daily.  90 tablet  2  . bethanechol (URECHOLINE) 50 MG tablet Take 1 tablet (50 mg total) by mouth 4 (four) times daily.  120 tablet  1  . citalopram (CELEXA) 40 MG tablet Take 40 mg by mouth daily.      . Cyanocobalamin (VITAMIN B 12 PO) Take 1 tablet by mouth daily.      Marland Kitchen doxazosin (CARDURA) 4 MG tablet Take 8 mg by mouth at bedtime.      . gabapentin (NEURONTIN) 300 MG capsule Take 1 capsule (300 mg total) by mouth 3 (three) times daily.  90 capsule  1  . oxyCODONE (ROXICODONE) 15 MG immediate release tablet Take 7.5 mg by mouth every 4 (four) hours as needed. For pain      . predniSONE (DELTASONE) 5 MG tablet Take 5 mg by mouth daily.      . tamsulosin (FLOMAX) 0.4 MG CAPS capsule Take 1 capsule (0.4 mg total) by mouth daily after breakfast.  30 capsule  1  . vitamin C (ASCORBIC ACID)  500 MG tablet Take 500 mg by mouth daily.      Marland Kitchen VITAMIN D, CHOLECALCIFEROL, PO Take 2-3 tablets by mouth daily.      Marland Kitchen VITAMIN E PO Take 2-3 tablets by mouth daily.      . nitroGLYCERIN (NITROSTAT) 0.4 MG SL tablet Place 0.4 mg under the tongue every 5 (five) minutes as needed. For chest pain        Home: Home Living Family/patient expects to be discharged to:: Private residence Living Arrangements: Spouse/significant other Available Help at Discharge: Family;Available 24 hours/day Type of Home: House Home Access: Stairs to enter [employer] of Steps: 1 from back entrance; 3-4 from front entrance Entrance Stairs-Rails: None Home Layout: One level Home Equipment: Walker - 2 wheels;Cane - single  point;Bedside commode;Grab bars - tub/shower;Wheelchair - Civil engineer, contracting: [employer] Additional Comments: wife has a Sports administrator  Lives With: Spouse  Functional History: Prior Function Comments: prior to first back sx in November 2014 Functional Status:  Mobility:     Ambulation/Gait General Gait Details: not assessed secondary to pain, decreased activity tolerance.    ADL: ADL Eating/Feeding: Supervision/safety Where Assessed - Eating/Feeding: Edge of bed Grooming: Wash/dry face;Teeth care;Set up;Supervision/safety Where Assessed - Grooming:  (sitting EOB) Lower Body Bathing: +1 Total assistance Where Assessed - Lower Body Bathing: Supine, head of bed up;Supine, head of bed flat;Rolling right and/or left Lower Body Dressing: +1 Total assistance Where Assessed - Lower Body Dressing: Supine, head of bed up;Supine, head of bed flat;Rolling right and/or left Tub/Shower Transfer Method: Not assessed Transfers/Ambulation Related to ADLs: Not assessed ADL Comments: Pt performed grooming sitting EOB. OT assisted with socks as he could not cross legs over knees.   Cognition: Cognition Overall Cognitive Status: Within Functional Limits for tasks  assessed Orientation Level: Oriented X4 Cognition Arousal/Alertness: Awake/alert Behavior During Therapy: Anxious Overall Cognitive Status: Within Functional Limits for tasks assessed  Blood pressure 147/66, pulse 81, temperature 100.8 F (38.2 C), temperature source Oral, resp. rate 18, height 6' (1.829 m), weight 81.602 kg (179 lb 14.4 oz), SpO2 97.00%. Physical Exam  Vitals reviewed. Constitutional: He is oriented to person, place, and time.  HENT:  Head: Normocephalic.  Eyes: EOM are normal.  Neck: Normal range of motion. Neck supple. No thyromegaly present.  Cardiovascular: Normal rate and regular rhythm.   Respiratory: Effort normal and breath sounds normal. No respiratory distress.  GI: Soft. Bowel sounds are normal. He exhibits no distension.  Neurological: He is alert and oriented to person, place, and time.  Skin:  Back incision is dressed   5/5 strength in 3 minus right deltoid and 4 minus left deltoid, 4-/bilateral bicep, tricep, grip Normal sensation in bilateral upper extremities Absent sensation left foot but intact above the ankle Left lower extremity strength 3 minus hip flexion knee extension 1/5 ankle plantarflexion 0/5 ankle dorsiflexion Normal sensation right foot and right lower extremity Right lower extremity strength is 4/5 hip flexion knee extension, 3 minus ankle dorsiflexion and plantarflexion  Results for orders placed during the hospital encounter of 07/29/13 (from the past 24 hour(s))  VANCOMYCIN, TROUGH     Status: None   Collection Time    08/03/13 10:55 AM      Result Value Range   Vancomycin Tr 16.1  10.0 - 20.0 ug/mL  URIC ACID     Status: None   Collection Time    08/03/13  3:55 PM      Result Value Range   Uric Acid, Serum 5.4  4.0 - 7.8 mg/dL   Dg Lumbar Spine Complete  08/02/2013   CLINICAL DATA:  Wound drainage.  EXAM: LUMBAR SPINE - COMPLETE 4+ VIEW  COMPARISON:  Lumbar MRI 07/29/2013  FINDINGS: Four lateral views in the operating room  were obtained. Pedicle screw fusion L4-5 and L5-S1. Interbody spacer L4-5. Hardware is unchanged on all the images.  Image number 1 reveals surgical instruments posterior to the spinal canal at the L2-3 level.  Image number 2 reveals a surgical instrument at the laminectomy defect at L3-4.  Image number 3 reveals advancement of the instrument overlying the posterior canal at L3-4.  Image number 4 reveals an instrument overlying the canal just below the L3 pedicle.  IMPRESSION: Lumbar localization as above.   Electronically Signed  By: Marlan Palauharles  Clark M.D.   On: 08/02/2013 16:22    Assessment/Plan: Diagnosis: Cauda equina syndrome with postoperative infection requiring incision and drainage 1. Does the need for close, 24 hr/day medical supervision in concert with the patient's rehab needs make it unreasonable for this patient to be served in a less intensive setting? Potentially 2. Co-Morbidities requiring supervision/potential complications: Wound infection, rotator cuff syndrome 3. Due to bladder management, bowel management, safety, skin/wound care, disease management, medication administration, pain management and patient education, does the patient require 24 hr/day rehab nursing? Potentially 4. Does the patient require coordinated care of a physician, rehab nurse, PT (1-2 hrs/day, 5 days/week) and OT (1-2 hrs/day, 5 days/week) to address physical and functional deficits in the context of the above medical diagnosis(es)? Potentially Addressing deficits in the following areas: balance, endurance, locomotion, strength, transferring, bowel/bladder control, bathing, dressing and toileting 5. Can the patient actively participate in an intensive therapy program of at least 3 hrs of therapy per day at least 5 days per week? Potentially 6. The potential for patient to make measurable gains while on inpatient rehab is good 7. Anticipated functional outcomes upon discharge from inpatient rehab are supervision  to min assist with PT, supervision to min assist with OT, not applicable with SLP. 8. Estimated rehab length of stay to reach the above functional goals is: Will reassess 9. Does the patient have adequate social supports to accommodate these discharge functional goals? Yes and Potentially 10. Anticipated D/C setting: Home 11. Anticipated post D/C treatments: HH therapy 12. Overall Rehab/Functional Prognosis: good  RECOMMENDATIONS: This patient's condition is appropriate for continued rehabilitative care in the following setting: CIR once able to tolerate PT OT Patient has agreed to participate in recommended program. Yes Note that insurance prior authorization may be required for reimbursement for recommended care.  Comment: Compared to prior consultation 06/21/2013 left foot drop is unchanged and right lower extremity strength has improved. Much of current mobility issue is related to left lower extremity pain which appears to be neurogenic    08/04/2013

## 2013-08-05 LAB — CULTURE, BLOOD (ROUTINE X 2)
Culture: NO GROWTH
Culture: NO GROWTH

## 2013-08-05 MED ORDER — GABAPENTIN 300 MG PO CAPS
600.0000 mg | ORAL_CAPSULE | Freq: Three times a day (TID) | ORAL | Status: DC
  Administered 2013-08-05 – 2013-08-08 (×9): 600 mg via ORAL
  Filled 2013-08-05 (×10): qty 2

## 2013-08-05 NOTE — Progress Notes (Signed)
Patient ID: [name], male   DOB: 02/09/1941, 73 y.o.   MRN: [id number] Stable, eating more. Wound dry. i applied his own splint, foot elevated to decrease the edema

## 2013-08-05 NOTE — Progress Notes (Signed)
Physical Therapy Treatment Patient Details Name: Jake MarvelRonald W [name] MRN: [id number] DOB: 08/07/1940 Today's Date: 08/05/2013 Time: [id number] PT Time Calculation (min): 21 min  PT Assessment / Plan / Recommendation  History of Present Illness Pt is a 73 yo male whoe had initial spinal surgery in November 2014, who presents now s/p INCISION AND DRAINAGE OF LUMBAR WOUND DEBRIDEMENT.   PT Comments   Session limited to bed mobility and tangling EOB with some basic there-ex. Patient unable to tolerate any further mobility at this time, and reports "electrical shocking" type pain 10/10.  Will continue to see and progress as tolerated. Educated patient on positioning for LLE to reduce edema.  Follow Up Recommendations  CIR     Does the patient have the potential to tolerate intense rehabilitation     Barriers to Discharge        Equipment Recommendations  Rolling walker with 5" wheels    Recommendations for Other Services Rehab consult  Frequency Min 5X/week   Progress towards PT Goals Progress towards PT goals: Not progressing toward goals - comment (continues to remain limited by pain)  Plan Current plan remains appropriate    Precautions / Restrictions Precautions Precautions: Back;Fall Precaution Booklet Issued: No Precaution Comments: Reviewed back precautions with pt. Restrictions Weight Bearing Restrictions: No   Pertinent Vitals/Pain 10/10 with activity 7/10 at rest    Mobility  Bed Mobility Overal bed mobility: Needs Assistance Bed Mobility: Rolling;Sidelying to Sit;Sit to Sidelying Rolling: Min assist Sidelying to sit: +2 for physical assistance Sit to sidelying: +2 for physical assistance Transfers General transfer comment: not assessed today secondary to pain    PT Goals (current goals can now be found in the care plan section) Acute Rehab PT Goals Patient Stated Goal: not stated PT Goal Formulation: With patient Time For Goal Achievement: 08/17/13 Potential to  Achieve Goals: Good  Visit Information  Last PT Received On: 08/05/13 Assistance Needed: +2 History of Present Illness: Pt is a 73 yo male whoe had initial spinal surgery in November 2014, who presents now s/p INCISION AND DRAINAGE OF LUMBAR WOUND DEBRIDEMENT.    Subjective Data  Subjective: I am gonna try  Patient Stated Goal: not stated   Cognition  Cognition Arousal/Alertness: Awake/alert Behavior During Therapy: Anxious Overall Cognitive Status: Within Functional Limits for tasks assessed    Balance  Balance Overall balance assessment: Needs assistance Sitting-balance support: Feet supported Sitting balance-Leahy Scale: Good Dynamic Sitting - Comments: increased pain could not tolerate extended time in seated position  End of Session PT - End of Session Equipment Utilized During Treatment: Gait belt Activity Tolerance: Patient limited by pain Patient left: in bed;with call bell/phone within reach;with bed alarm set (positioned with LLE elevated for edema control) Nurse Communication: Mobility status;Need for lift equipment;Patient requests pain meds;Other (comment) (pericare, +BM without recognition, Nauseated)   GP     Fabio AsaWerner, Tykeisha Peer J 08/05/2013, 3:57 PM Charlotte Crumbevon Carnetta Losada, PT DPT  [phone]

## 2013-08-05 NOTE — Progress Notes (Addendum)
[facility]    Date of Admission:  07/29/2013   Total days of antibiotics 6        Day 6 ceftriaxone        Day 6 rifampin        Day 6 vanco   ID: [name] is a 73 y.o. male  with L3-S1 fusion on 11/21 c/b durotomy and cauda equina syndrome presents with worsening low back pain and fevers. MRI concerning for epidural abscess POD#3 for I x D. Cultures negative.inflammatory markers not significantly elevated  Active Problems:   Post-operative pain   Lumbar degenerative disc disease    Subjective: Afebrile today. Still having significant back pain especially after being moved  Medications:  . bethanechol  50 mg Oral QID  . bupivacaine liposome  20 mL Infiltration Once  . cefTRIAXone (ROCEPHIN)  IV  2 g Intravenous Q24H  . citalopram  40 mg Oral Daily  . doxazosin  8 mg Oral QHS  . gabapentin  400 mg Oral TID  . heparin subcutaneous  5,000 Units Subcutaneous Q8H  . rifampin  300 mg Oral Q12H  . sodium chloride  3 mL Intravenous Q12H  . tamsulosin  0.4 mg Oral QPC breakfast  . vancomycin  1,000 mg Intravenous Q12H  . vitamin C  500 mg Oral Daily    Objective: Vital signs in last 24 hours: Temp:  [98.9 F (37.2 C)-99.5 F (37.5 C)] 99.5 F (37.5 C) (01/09 1400) Pulse Rate:  [74-83] 83 (01/09 1400) Resp:  [18-20] 18 (01/09 1400) BP: (124-167)/(53-69) 124/53 mmHg (01/09 1400) SpO2:  [96 %-98 %] 96 % (01/09 1400)  Physical Exam  Constitutional: He is oriented to person, place, and time. He appears well-developed and well-nourished. No distress.  HENT:  Mouth/Throat: Oropharynx is clear and moist. No oropharyngeal exudate.  Cardiovascular: Normal rate, regular rhythm and normal heart sounds. Exam reveals no gallop and no friction rub.  No murmur heard.  Pulmonary/Chest: Effort normal and breath sounds normal. No respiratory distress. He has no wheezes.  Abdominal: Soft. Bowel sounds are normal. He exhibits no distension. There is no  tenderness.  Lymphadenopathy:  He has no cervical adenopathy.  Neurological: He is alert and oriented to person, place, and time. Lower extremity weakness. Sensitive to moving lower extremity ? neuropathy  Skin: Skin is warm and dry. No rash noted. No erythema.      Lab Results  Recent Labs  08/04/13 1955  WBC 8.3  HGB 9.4*  HCT 28.0*  NA 136*  K 3.9  CL 97  CO2 27  BUN 9  CREATININE 0.91   Lab Results  Component Value Date   ESRSEDRATE 23* 07/30/2013   Lab Results  Component Value Date   CRP 2.4* 07/30/2013    Microbiology: 1/6 or culture NGTD 1/3 blood culture NGTD 1/8 blood cx pending 1/8 urine cx pending  Studies/Results: Dg Chest Port 1 View  08/04/2013   CLINICAL DATA:  Cough and fever  EXAM: PORTABLE CHEST - 1 VIEW  COMPARISON:  07/31/2013  FINDINGS: Previous CABG procedure. Normal heart size. Again noted is volume loss at the right lung base with blunting of the right costophrenic angle. This is a chronic abnormality and appears stable from 12/12/2011. No left pleural effusion identified. Pulmonary vascular congestion is noted. Anterior sideplate is identified within the lower cervical and upper thoracic spine.  IMPRESSION: 1. Persistent pulmonary vascular congestion. 2. No pneumonia.   Electronically Signed  By: Signa Kellaylor  Stroud M.D.   On: 08/04/2013 16:45     Assessment/Plan: 73yo M with L3-S1 fusion on 11/21 c/b durotomy and cauda equina syndrome presents with worsening low back pain and fevers. MRI concerning for epidural abscess POD#3 from I X D, cultures negative thus far. We are treating for presumed infection. Patient had been on multiple days of antibiotics prior to I xD which could have sterilized cultures - continue on vanco, ceftriaxone, and rifampin. vanco at goal of 15-20.  Fever = pan cultured yesterday. We will follow culture results. Thus far cxr unchanged. Flu screen negative. Presumed lumbar infection, will continue with vancomycin, ceftriaxone,  and rifampin x 6 wks  Pain management = will increase his neurontin from 400mg  to 600mg  TID but wonder if having anesthesia/pain management consultation would be of use to help manage [name]'s pain associated with his presentation.  Dr. Ninetta LightsHatcher to be available for questions over the weekend. Will see back on monday  [name], [facility] Cell: [phone] Pager: [phone]  08/05/2013, 2:21 PM

## 2013-08-05 NOTE — Progress Notes (Signed)
Occupational Therapy Treatment Patient Details Name: Ignacia MarvelRonald W Critz MRN: [id number] DOB: 05/20/1941 Today's Date: 08/05/2013 Time: [id number] OT Time Calculation (min): 25 min  OT Assessment / Plan / Recommendation  History of present illness Pt is a 73 yo male whoe had initial spinal surgery in November 2014, who presents now s/p INCISION AND DRAINAGE OF LUMBAR WOUND DEBRIDEMENT.   OT comments  Pt. Awake.  Attempted skilled ot. Pt. Unable to tolerate movement of LLE or side lying in prep for supine to sit with max/total a.   Very limited with any initiation of b le and trunk movement secondary to increased c/o pain.  Pt. Verbalizing throughout attempts how upset and fearful he is due to his limitations and pain.  "no one knows what it feels like when i try to move that leg, i just wonder if i will ever be able to walk again"  Follow Up Recommendations  CIR                  Recommendations for Other Services Rehab consult  Frequency Min 2X/week             Precautions / Restrictions Precautions Precautions: Back;Fall   Pertinent Vitals/Pain 5/10 before activity, 10/10 after activity-rn notified due for meds soon    ADL  ADL Comments: pt. declined all suggestions for skilled ot involving selfcare, states pain is main factor    OT Diagnosis: Acute pain  OT Problem List: Decreased strength;Decreased activity tolerance;Decreased knowledge of use of DME or AE;Decreased knowledge of precautions;Pain OT Treatment Interventions: Self-care/ADL training;DME and/or AE instruction;Therapeutic activities;Patient/family education;Balance training   OT Goals(current goals can now be found in the care plan section)    Visit Information  Last OT Received On: 08/05/13 History of Present Illness: Pt is a 73 yo male whoe had initial spinal surgery in November 2014, who presents now s/p INCISION AND DRAINAGE OF LUMBAR WOUND DEBRIDEMENT.    Subjective Data   (see above comments)                  Mobility  Bed Mobility General bed mobility comments: attempted bed mobility in prep for functional transfers.  pt. total assist to initiate movement of LLE but then cried out and requested stopping movement.  total assist with use of bed pads to bring into sidelying for log roll. pt. unable to tolerate              End of Session OT - End of Session Activity Tolerance: Patient limited by pain Patient left: in bed;with call bell/phone within reach       Robet LeuMorris, Justn Quale Lorraine, COTA/L 08/05/2013, 7:16 AM

## 2013-08-05 NOTE — Progress Notes (Signed)
I met with pt at bedside. Patient reports he is wheelchair independent at home for he did not feel comfortable ambulating with wife at supervision level. Was receiving PT and OT at home 3 times per week. Pain limiting his therapy at this time. I will follow up Monday to hopefully seek admission to inpt rehab again. [phone]

## 2013-08-05 NOTE — Progress Notes (Signed)
Patient ID: [name], male   DOB: 08/13/1940, 73 y.o.   MRN: [id number] No fever, wouind dry. So cultures are negative. Seen by rehabilitation. C/o pain left shoulder.

## 2013-08-05 NOTE — Plan of Care (Signed)
Pt is complaining of increased pain to left foot after boot placed to left foot. Wants boot to be taken off. Boot taken off of left foot per request. Will cont to monitor.

## 2013-08-06 DIAGNOSIS — G062 Extradural and subdural abscess, unspecified: Secondary | ICD-10-CM

## 2013-08-06 DIAGNOSIS — M67919 Unspecified disorder of synovium and tendon, unspecified shoulder: Secondary | ICD-10-CM | POA: Diagnosis not present

## 2013-08-06 LAB — BASIC METABOLIC PANEL
BUN: 9 mg/dL (ref 6–23)
CHLORIDE: 98 meq/L (ref 96–112)
CO2: 28 meq/L (ref 19–32)
CREATININE: 0.95 mg/dL (ref 0.50–1.35)
Calcium: 8.5 mg/dL (ref 8.4–10.5)
GFR calc Af Amer: >90 mL/min (ref >90)
GFR calc non Af Amer: 82 mL/min — ABNORMAL LOW (ref >90)
Glucose, Bld: 117 mg/dL — ABNORMAL HIGH (ref 70–99)
Potassium: 4.4 mEq/L (ref 3.7–5.3)
Sodium: 135 mEq/L — ABNORMAL LOW (ref 137–147)

## 2013-08-06 LAB — CBC WITH DIFFERENTIAL/PLATELET
BASOS ABS: 0 10*3/uL (ref 0.0–0.1)
Basophils Relative: 0 % (ref 0–1)
Eosinophils Absolute: 0.6 10*3/uL (ref 0.0–0.7)
Eosinophils Relative: 9 % — ABNORMAL HIGH (ref 0–5)
HEMATOCRIT: 27.3 % — AB (ref 39.0–52.0)
HEMOGLOBIN: 9.2 g/dL — AB (ref 13.0–17.0)
LYMPHS ABS: 0.7 10*3/uL (ref 0.7–4.0)
Lymphocytes Relative: 11 % — ABNORMAL LOW (ref 12–46)
MCH: 29.3 pg (ref 26.0–34.0)
MCHC: 33.7 g/dL (ref 30.0–36.0)
MCV: 86.9 fL (ref 78.0–100.0)
Monocytes Absolute: 0.7 10*3/uL (ref 0.1–1.0)
Monocytes Relative: 11 % (ref 3–12)
NEUTROS ABS: 4.4 10*3/uL (ref 1.7–7.7)
Neutrophils Relative %: 68 % (ref 43–77)
Platelets: 334 10*3/uL (ref 150–400)
RBC: 3.14 MIL/uL — AB (ref 4.22–5.81)
RDW: 14.1 % (ref 11.5–15.5)
WBC: 6.5 10*3/uL (ref 4.0–10.5)

## 2013-08-06 LAB — URINE CULTURE
COLONY COUNT: NO GROWTH
CULTURE: NO GROWTH

## 2013-08-06 MED ORDER — BUPIVACAINE HCL (PF) 0.25 % IJ SOLN
5.0000 mL | Freq: Once | INTRAMUSCULAR | Status: DC
  Filled 2013-08-06 (×2): qty 10

## 2013-08-06 MED ORDER — BUPIVACAINE HCL 0.25 % IJ SOLN
5.0000 mL | Freq: Once | INTRAMUSCULAR | Status: DC
  Filled 2013-08-06: qty 5

## 2013-08-06 MED ORDER — METHYLPREDNISOLONE ACETATE 40 MG/ML IJ SUSP
40.0000 mg | Freq: Once | INTRAMUSCULAR | Status: DC
  Filled 2013-08-06 (×2): qty 1

## 2013-08-06 NOTE — Progress Notes (Signed)
ANTIBIOTIC CONSULT NOTE - FOLLOW UP  Pharmacy Consult for rocephin/vancomycin Indication: lumbar wound  Allergies  Allergen Reactions  . Morphine Other (See Comments)    REACTION: made him go crazy; "I had fun w/it; my family didn't think it was too funny"  . Metoprolol Other (See Comments)    REACTION:  Tachycardia; "don't remember how bad it was; it was so long ago"    Patient Measurements: Height: 6' (182.9 cm) Weight: 179 lb 14.4 oz (81.602 kg) IBW/kg (Calculated) : 77.6  Vital Signs: Temp: 98.9 F (37.2 C) (01/10 1448) Temp src: Oral (01/10 1448) BP: 146/53 mmHg (01/10 1448) Pulse Rate: 73 (01/10 1448) Intake/Output from previous day: 01/09 0701 - 01/10 0700 In: -  Out: 3275 [Urine:3275] Intake/Output from this shift:    Labs:  Recent Labs  08/04/13 1955 08/06/13 0544  WBC 8.3 6.5  HGB 9.4* 9.2*  PLT 299 334  CREATININE 0.91 0.95   Estimated Creatinine Clearance: 79.4 ml/min (by C-G formula based on Cr of 0.95). No results found for this basename: VANCOTROUGH, VANCOPEAK, VANCORANDOM, GENTTROUGH, GENTPEAK, GENTRANDOM, TOBRATROUGH, TOBRAPEAK, TOBRARND, AMIKACINPEAK, AMIKACINTROU, AMIKACIN,  in the last 72 hours    Assessment: [age] y/o M with recent back surgery 6 weeks ago presents with pain in the groin area, low back and dysuria.07/29/13 MRI:1. 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 2. Additional loculated abscesses within the laminectomy site at L3-4 and L4-5 as detailed above. Patient continues on rocephin and vancomycin for epidural abscess. WBC= 6.5, SCr= 0.95 and CrCl ~ 80 and last vancomycin level was at goal. Noted plans for 6 weeks antibiotics per ID.  1/3 Vanco>> --1/5 VT = 12.7 >> change vanc to 1g q12 --1/7 VT = 16.1 ok 1/3 Rocephin>> 1/3 Rifampin>>  1/3 BC x 2>>negative 1/6 Wound>>negative   Goal of Therapy:  Vancomycin trough level 15-20  mcg/ml  Plan:  -No dose changes needed -Will reassess a vancomycin trough next week  Harland GermanAndrew Sophiarose Eades, Pharm D 08/06/2013 4:07 PM

## 2013-08-06 NOTE — Progress Notes (Signed)
Patient ID: [name], male   DOB: 12/02/1940, 73 y.o.   MRN: [id number] BP 160/67  Pulse 82  Temp(Src) 99.5 F (37.5 C) (Oral)  Resp 18  Ht 6' (1.829 m)  Wt 81.602 kg (179 lb 14.4 oz)  BMI 24.39 kg/m2  SpO2 100% Alert and oriented x 4, speech is clear and fluent Left foot quite edematous, no erythema Complaining of severe pain in left foot, and left shoulder.  Low grade fever today

## 2013-08-06 NOTE — Progress Notes (Signed)
INFECTIOUS DISEASE PROGRESS NOTE  ID: [name] is a 73 y.o. male with  Active Problems:   Post-operative pain   Lumbar degenerative disc disease  Subjective: C/o pain  Abtx:  Anti-infectives   Start     Dose/Rate Route Frequency Ordered Stop   08/02/13 1900  ceFAZolin (ANCEF) IVPB 1 g/50 mL premix  Status:  Discontinued     1 g 100 mL/hr over 30 Minutes Intravenous Every 8 hours 08/02/13 1853 08/02/13 1854   08/01/13 2200  cefTRIAXone (ROCEPHIN) 2 g in dextrose 5 % 50 mL IVPB     2 g 100 mL/hr over 30 Minutes Intravenous Every 24 hours 08/01/13 0929     08/01/13 2200  vancomycin (VANCOCIN) IVPB 1000 mg/200 mL premix     1,000 mg 200 mL/hr over 60 Minutes Intravenous Every 12 hours 08/01/13 1124     07/30/13 2200  vancomycin (VANCOCIN) IVPB 750 mg/150 ml premix  Status:  Discontinued     750 mg 150 mL/hr over 60 Minutes Intravenous Every 12 hours 07/30/13 0937 08/01/13 1124   07/30/13 1000  rifampin (RIFADIN) capsule 300 mg     300 mg Oral Every 12 hours 07/30/13 0908     07/30/13 1000  cefTRIAXone (ROCEPHIN) 1 g in dextrose 5 % 50 mL IVPB  Status:  Discontinued     1 g 100 mL/hr over 30 Minutes Intravenous Every 12 hours 07/30/13 0937 08/01/13 0929   07/30/13 0945  vancomycin (VANCOCIN) 1,250 mg in sodium chloride 0.9 % 250 mL IVPB     1,250 mg 166.7 mL/hr over 90 Minutes Intravenous NOW 07/30/13 0937 07/30/13 1217      Medications:  Scheduled: . bethanechol  50 mg Oral QID  . bupivacaine (PF)  5 mL Infiltration Once  . bupivacaine liposome  20 mL Infiltration Once  . cefTRIAXone (ROCEPHIN)  IV  2 g Intravenous Q24H  . citalopram  40 mg Oral Daily  . doxazosin  8 mg Oral QHS  . gabapentin  600 mg Oral TID  . heparin subcutaneous  5,000 Units Subcutaneous Q8H  . methylPREDNISolone acetate  40 mg Intra-articular Once  . rifampin  300 mg Oral Q12H  . sodium chloride  3 mL Intravenous Q12H  . tamsulosin  0.4 mg Oral QPC breakfast  . vancomycin  1,000 mg  Intravenous Q12H  . vitamin C  500 mg Oral Daily    Objective: Vital signs in last 24 hours: Temp:  [98 F (36.7 C)-99.5 F (37.5 C)] 99.5 F (37.5 C) (01/10 0910) Pulse Rate:  [67-83] 82 (01/10 0910) Resp:  [16-18] 18 (01/10 0910) BP: (124-160)/(53-67) 160/67 mmHg (01/10 0910) SpO2:  [95 %-100 %] 100 % (01/10 0910)   General appearance: alert, cooperative and no distress Resp: clear to auscultation bilaterally Cardio: regular rate and rhythm GI: normal findings: bowel sounds normal and soft, non-tender Extremities: L foot hyperesthetic, edematous.  Back wound is wet   Lab Results  Recent Labs  08/04/13 1955 08/06/13 0544  WBC 8.3 6.5  HGB 9.4* 9.2*  HCT 28.0* 27.3*  NA 136* 135*  K 3.9 4.4  CL 97 98  CO2 27 28  BUN 9 9  CREATININE 0.91 0.95   Liver Panel No results found for this basename: PROT, ALBUMIN, AST, ALT, ALKPHOS, BILITOT, BILIDIR, IBILI,  in the last 72 hours Sedimentation Rate No results found for this basename: ESRSEDRATE,  in the last 72 hours C-Reactive Protein No results found for this basename: CRP,  in the last 72 hours  Microbiology: Recent Results (from the past 240 hour(s))  CULTURE, BLOOD (ROUTINE X 2)     Status: None   Collection Time    07/30/13 10:10 AM      Result Value Range Status   Specimen Description BLOOD LEFT ARM   Final   Special Requests BOTTLES DRAWN AEROBIC ONLY 10CC   Final   Culture  Setup Time     Final   Value: 07/30/2013 18:10     Performed at [facility]   Culture     Final   Value: NO GROWTH 5 DAYS     Performed at [facility]   Report Status 08/05/2013 FINAL   Final  CULTURE, BLOOD (ROUTINE X 2)     Status: None   Collection Time    07/30/13 10:15 AM      Result Value Range Status   Specimen Description BLOOD LEFT HAND   Final   Special Requests BOTTLES DRAWN AEROBIC ONLY 10CC   Final   Culture  Setup Time     Final   Value: 07/30/2013 18:10     Performed at [facility]    Culture     Final   Value: NO GROWTH 5 DAYS     Performed at [facility]   Report Status 08/05/2013 FINAL   Final  ANAEROBIC CULTURE     Status: None   Collection Time    08/02/13  4:06 PM      Result Value Range Status   Specimen Description WOUND BACK   Final   Special Requests PT ON VANCO ROCEPHIN   Final   Gram Stain     Final   Value: MODERATE WBC PRESENT,BOTH PMN AND MONONUCLEAR     NO ORGANISMS SEEN     [facility]     Performed at [facility]   Culture     Final   Value: NO ANAEROBES ISOLATED; CULTURE IN PROGRESS FOR 5 DAYS     Performed at [facility]   Report Status PENDING   Incomplete  WOUND CULTURE     Status: None   Collection Time    08/02/13  4:06 PM      Result Value Range Status   Specimen Description WOUND BACK   Final   Special Requests PT ON VANCO ROCEPHIN   Final   Gram Stain     Final   Value: MODERATE WBC PRESENT,BOTH PMN AND MONONUCLEAR     NO ORGANISMS SEEN     [facility]     Performed at [facility]   Culture     Final   Value: NO GROWTH 2 DAYS     Performed at [facility]   Report Status 08/04/2013 FINAL   Final  GRAM STAIN     Status: None   Collection Time    08/02/13  4:06 PM      Result Value Range Status   Specimen Description WOUND BACK   Final   Special Requests PT ON VANCO ROCEPHIN   Final   Gram Stain     Final   Value: MODERATE WBC PRESENT,BOTH PMN AND MONONUCLEAR     NO ORGANISMS SEEN   Report Status 08/02/2013 FINAL   Final    Studies/Results: Dg Chest Port 1 View  08/04/2013   CLINICAL DATA:  Cough and fever  EXAM: PORTABLE CHEST - 1 VIEW  COMPARISON:  07/31/2013  FINDINGS: Previous CABG procedure. Normal heart size. Again noted is volume loss at the right lung base with blunting of the right costophrenic angle. This is a chronic abnormality and appears stable from 12/12/2011. No left pleural effusion identified. Pulmonary vascular congestion  is noted. Anterior sideplate is identified within the lower cervical and upper thoracic spine.  IMPRESSION: 1. Persistent pulmonary vascular congestion. 2. No pneumonia.   Electronically Signed   By: Signa Kell M.D.   On: 08/04/2013 16:45     Assessment/Plan: Epidural abscess on MRI L3-S1 fusion 11/21  Cx (-) from OR. Received anbx at home prior to adm.  Afebrile since 08-04-13. BCX ngtd Would continue his anbx, manage pain.  Check Doppler LLE  Total days of antibiotics: 7 ceftriaxone/vanco/rifampin         Johny Sax Infectious Diseases (pager) [phone] [email or web address] 08/06/2013, 12:17 PM  LOS: 8 days

## 2013-08-06 NOTE — Consult Note (Signed)
Reason for Consult:Left shoulder pain Referring Physician: Dr Dellia Beckwith is an 73 y.o. male.  HPI: Patient s/p lumbar decompression and fusion with Dr. Joya Salm approx 7 weeks ago had represented to the ED with c/o increased pain, weakness, also with signs of infection.  He was readmitted with concerns for possible spinal abscess and taken back to the operating room on 08/03/13 for open exploration to r/o infection.  He is on antibiotics managed by infectious disease.  The surgeries have caused severe lower extremity weakness making him independent to wheelchair.  He has had increase in left shoulder pain in which we have been consulted by Dr. Joya Salm for evaluation and treatment.    Patient does mention history of bilat shoulder problems.  History of failed rotator cuff repair on the right and upon further review of outpatient records MRI proven RTC tear on the left which has been treated conservatively.  Last seen in our office by Dr. Mardelle Matte in Jan 2011.  Patient denies any new injury or trauma to the shoulder, feels similar to pain he has experienced in the past with overuse.  He feels it is making it difficult for transfers currently.  He has done well with corticosteroid injections in the past which have given significant relief.    Past Medical History  Diagnosis Date  . Hyperlipidemia   . Small bowel problem     HAD ALOT OF SCAR TISSUE FROM PREVIOUS SURGERIES..NG WAS INSERTED ...Marland KitchenMarland KitchenNO SURGERY NEEDED...Marland KitchenMarland KitchenIN FOR 8 DAYS  . Disorder of blood     BEEN TREATED BY DERMATOLOGIST X 4 YRS..."BLOOD BLISTERS"  . Arthritis   . Gout   . Anxiety   . Hx MRSA infection     rt shoulder  . Pneumonia     "I've had it 3-4 times"  . Coronary artery disease     IN 2000   STENT PLACED IN 2012 sees Dr. Lattie Haw, saw last 2013  . HTN (hypertension)     sees Dr. [name] in [place]  . Small bowel obstruction   . Ventral hernia   . Acute renal failure 04/17/2013  . 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 Sternal surg  2000    HAS HAD 5-6 ON HIS STERNUM; "caught MRSA in it"  . Lumbar laminectomy/decompression microdiscectomy  06/13/2011    Procedure: LUMBAR LAMINECTOMY/DECOMPRESSION MICRODISCECTOMY;  Surgeon: Floyce Stakes;  Location: [place] [facility];  Service: Neurosurgery;  Laterality: N/A;  Lumbar three, lumbar four-five Laminectomy  . Eye surgery      bilateral cataract  . Anterior cervical corpectomy  12/17/11  . Incision and drainage of wound  ~ 20ll; 12/03/11    "had infection in my right"  . Shoulder open rotator cuff repair  ~ 2011    right  . Peripherally inserted central catheter insertion  2011 & 11/2011  . Cataract extraction w/ intraocular lens  implant, bilateral  ? 2011  . Coronary artery bypass graft  2000    CABG X5  . Back surgery      lumbar  . Tonsillectomy  1949  . Cholecystectomy  2006 "or after"  . Coronary angioplasty with stent placement  2012  . Anterior cervical corpectomy  12/17/2011    Procedure: ANTERIOR CERVICAL CORPECTOMY;  Surgeon: Floyce Stakes, MD;  Location: [place] [facility];  Service: Neurosurgery;  Laterality: N/A;  Anterior Cervical Decompression Fusion Five to Thoracic  Two with plating  . Lumbar laminectomy/decompression microdiscectomy Right 06/17/2013    Procedure: LUMBAR LAMINECTOMY/DECOMPRESSION MICRODISCECTOMY 1 LEVEL;  Surgeon: Floyce Stakes, MD;  Location: [place] [facility];  Service: Neurosurgery;  Laterality: Right;  Right L3-4 Microdiskectomy  . Lumbar wound debridement N/A 08/02/2013    Procedure: INCISION AND DRAINAGE OF LUMBAR WOUND DEBRIDEMENT;  Surgeon: Floyce Stakes, MD;  Location: [place] [facility] OR[facility];  Service: Neurosurgery;  Laterality: N/A;    Family History  Problem Relation Age of Onset  . Heart disease    . Hypertension Mother   . Hypertension Maternal Aunt   . Hypertension Maternal Uncle   . Anesthesia problems Neg Hx   . Hypotension Neg Hx   . Malignant  hyperthermia Neg Hx   . Pseudochol deficiency Neg Hx     Social History:  reports that he quit smoking about 14 years ago. His smoking use included Cigarettes. He has a 1 pack-year smoking history. He has never used smokeless tobacco. He reports that he does not drink alcohol or use illicit drugs.  Allergies:  Allergies  Allergen Reactions  . Morphine Other (See Comments)    REACTION: made him go crazy; "I had fun w/it; my family didn't think it was too funny"  . Metoprolol Other (See Comments)    REACTION:  Tachycardia; "don't remember how bad it was; it was so long ago"    Medications: I have reviewed the patient's current medications.  Results for orders placed during the hospital encounter of 07/29/13 (from the past 48 hour(s))  INFLUENZA PANEL BY PCR (TYPE A & B, H1N1)     Status: None   Collection Time    08/04/13 10:54 AM      Result Value Range   Influenza A By PCR NEGATIVE  NEGATIVE   Influenza B By PCR NEGATIVE  NEGATIVE   H1N1 flu by pcr NOT DETECTED  NOT DETECTED   Comment:            The Xpert Flu assay (FDA approved for     nasal aspirates or washes and     nasopharyngeal swab specimens), is     intended as an aid in the diagnosis of     influenza and should not be used as     a sole basis for treatment.  CBC WITH DIFFERENTIAL     Status: Abnormal   Collection Time    08/04/13  7:55 PM      Result Value Range   WBC 8.3  4.0 - 10.5 K/uL   RBC 3.16 (*) 4.22 - 5.81 MIL/uL   Hemoglobin 9.4 (*) 13.0 - 17.0 g/dL   HCT 28.0 (*) 39.0 - 52.0 %   MCV 88.6  78.0 - 100.0 fL   MCH 29.7  26.0 - 34.0 pg   MCHC 33.6  30.0 - 36.0 g/dL   RDW 14.0  11.5 - 15.5 %   Platelets 299  150 - 400 K/uL   Neutrophils Relative % 73  43 - 77 %   Neutro Abs 6.0  1.7 - 7.7 K/uL   Lymphocytes Relative 10 (*) 12 - 46 %   Lymphs Abs 0.9  0.7 - 4.0 K/uL   Monocytes Relative 10  3 - 12 %   Monocytes Absolute 0.9  0.1 - 1.0 K/uL   Eosinophils Relative 7 (*) 0 - 5 %   Eosinophils Absolute  0.5  0.0 - 0.7 K/uL   Basophils Relative 0  0 - 1 %  Basophils Absolute 0.0  0.0 - 0.1 K/uL  BASIC METABOLIC PANEL     Status: Abnormal   Collection Time    08/04/13  7:55 PM      Result Value Range   Sodium 136 (*) 137 - 147 mEq/L   Potassium 3.9  3.7 - 5.3 mEq/L   Chloride 97  96 - 112 mEq/L   CO2 27  19 - 32 mEq/L   Glucose, Bld 94  70 - 99 mg/dL   BUN 9  6 - 23 mg/dL   Creatinine, Ser 0.91  0.50 - 1.35 mg/dL   Calcium 8.6  8.4 - 10.5 mg/dL   GFR calc non Af Amer 84 (*) >90 mL/min   GFR calc Af Amer >90  >90 mL/min   Comment: (NOTE)     The eGFR has been calculated using the CKD EPI equation.     This calculation has not been validated in all clinical situations.     eGFR's persistently <90 mL/min signify possible Chronic Kidney     Disease.  URINALYSIS, ROUTINE W REFLEX MICROSCOPIC     Status: Abnormal   Collection Time    08/04/13 10:19 PM      Result Value Range   Color, Urine ORANGE (*) YELLOW   Comment: BIOCHEMICALS MAY BE AFFECTED BY COLOR   APPearance HAZY (*) CLEAR   Specific Gravity, Urine 1.015  1.005 - 1.030   pH 5.0  5.0 - 8.0   Glucose, UA NEGATIVE  NEGATIVE mg/dL   Hgb urine dipstick SMALL (*) NEGATIVE   Bilirubin Urine NEGATIVE  NEGATIVE   Ketones, ur NEGATIVE  NEGATIVE mg/dL   Protein, ur NEGATIVE  NEGATIVE mg/dL   Urobilinogen, UA 0.2  0.0 - 1.0 mg/dL   Nitrite NEGATIVE  NEGATIVE   Leukocytes, UA TRACE (*) NEGATIVE  URINE MICROSCOPIC-ADD ON     Status: Abnormal   Collection Time    08/04/13 10:19 PM      Result Value Range   WBC, UA 3-6  <3 WBC/hpf   RBC / HPF 0-2  <3 RBC/hpf   Bacteria, UA FEW (*) RARE   Casts GRANULAR CAST (*) NEGATIVE  CBC WITH DIFFERENTIAL     Status: Abnormal   Collection Time    08/06/13  5:44 AM      Result Value Range   WBC 6.5  4.0 - 10.5 K/uL   RBC 3.14 (*) 4.22 - 5.81 MIL/uL   Hemoglobin 9.2 (*) 13.0 - 17.0 g/dL   HCT 27.3 (*) 39.0 - 52.0 %   MCV 86.9  78.0 - 100.0 fL   MCH 29.3  26.0 - 34.0 pg   MCHC 33.7  30.0  - 36.0 g/dL   RDW 14.1  11.5 - 15.5 %   Platelets 334  150 - 400 K/uL   Neutrophils Relative % 68  43 - 77 %   Neutro Abs 4.4  1.7 - 7.7 K/uL   Lymphocytes Relative 11 (*) 12 - 46 %   Lymphs Abs 0.7  0.7 - 4.0 K/uL   Monocytes Relative 11  3 - 12 %   Monocytes Absolute 0.7  0.1 - 1.0 K/uL   Eosinophils Relative 9 (*) 0 - 5 %   Eosinophils Absolute 0.6  0.0 - 0.7 K/uL   Basophils Relative 0  0 - 1 %   Basophils Absolute 0.0  0.0 - 0.1 K/uL  BASIC METABOLIC PANEL     Status: Abnormal   Collection Time  08/06/13  5:44 AM      Result Value Range   Sodium 135 (*) 137 - 147 mEq/L   Potassium 4.4  3.7 - 5.3 mEq/L   Chloride 98  96 - 112 mEq/L   CO2 28  19 - 32 mEq/L   Glucose, Bld 117 (*) 70 - 99 mg/dL   BUN 9  6 - 23 mg/dL   Creatinine, Ser 0.95  0.50 - 1.35 mg/dL   Calcium 8.5  8.4 - 10.5 mg/dL   GFR calc non Af Amer 82 (*) >90 mL/min   GFR calc Af Amer >90  >90 mL/min   Comment: (NOTE)     The eGFR has been calculated using the CKD EPI equation.     This calculation has not been validated in all clinical situations.     eGFR's persistently <90 mL/min signify possible Chronic Kidney     Disease.    Dg Chest Port 1 View  08/04/2013   CLINICAL DATA:  Cough and fever  EXAM: PORTABLE CHEST - 1 VIEW  COMPARISON:  07/31/2013  FINDINGS: Previous CABG procedure. Normal heart size. Again noted is volume loss at the right lung base with blunting of the right costophrenic angle. This is a chronic abnormality and appears stable from 12/12/2011. No left pleural effusion identified. Pulmonary vascular congestion is noted. Anterior sideplate is identified within the lower cervical and upper thoracic spine.  IMPRESSION: 1. Persistent pulmonary vascular congestion. 2. No pneumonia.   Electronically Signed   By: Kerby Moors M.D.   On: 08/04/2013 16:45    Review of Systems  Constitutional: Positive for fever and malaise/fatigue.  Musculoskeletal: Positive for back pain and joint pain. Negative  for falls and neck pain.  Neurological: Positive for focal weakness and weakness.  Positive ROS as above All other systems have been reviewed and were otherwise negative with the exception of those mentioned in the HPI and as above.  Blood pressure 160/67, pulse 82, temperature 99.5 F (37.5 C), temperature source Oral, resp. rate 18, height 6' (1.829 m), weight 81.602 kg (179 lb 14.4 oz), SpO2 100.00%. Physical Exam  Constitutional: He is oriented to person, place, and time. He appears well-developed and well-nourished. No distress.  HENT:  Head: Normocephalic and atraumatic.  Nose: Nose normal.  Eyes: Conjunctivae and EOM are normal.  Neck: Neck supple.  Cardiovascular: Regular rhythm and intact distal pulses.   Respiratory: Effort normal.  GI: Soft. There is no tenderness.  Musculoskeletal:       Left shoulder: He exhibits tenderness, pain and decreased strength. He exhibits normal range of motion, no swelling, no effusion and no deformity.  Overall good motion pain at the extremes, has positive impingement with hawkins testing and hyperabduction of the arm across the chest, TTP over the subacromial bursa laterally, pain with resisted motion with weakness in abduction and rotation.  No erythema or warmth, no severe pain with passive motion.  Neurological: He is alert and oriented to person, place, and time. No cranial nerve deficit.  Lower extremities not tested, but previous exams showing functionallhy paraplegic with slight movements sensory grossly normal  Skin: Skin is warm and dry. No erythema.  Psychiatric: He has a normal mood and affect. His behavior is normal.   Deferred new xrays of the left shoulder today.  Assessment/Plan: Left shoulder pain with known RTC tear possible bursitis  Likely an exacerbation of known cuff tear with current dependence of upper extremities following lumbar surgery and lower extremity weakness.  In lieu of difficulty with mobility with increased  shoulder pain do feel cortisone injection may give adequate relief.  Discussed risks and benefits of shoulder injection and patient wishes to proceed, see procedure note below.  If pain continues following d/c from hospital recommend outpatient f/u to again discuss options.  Patient discussed with Dr. French Ana today and he agrees with treatment plan.  Procedure:  Left shoulder aspiration/injection  Patient seated in his room left arm draped to side.  Using a posterior subacromial approach skin cleaned and prepped in a sterile fashion using betadine swab.  Shoulder then aspirated and injected with a mixture of depo-medrol and marcaine 62ml/5ml.  Patient tolerated procedure well without complication.    Chriss Czar 08/06/2013, 9:19 AM

## 2013-08-06 NOTE — Progress Notes (Signed)
Patient was helped to sit at the side of the bed this afternoon, tolerated it well with minimal pain.

## 2013-08-07 DIAGNOSIS — M79609 Pain in unspecified limb: Secondary | ICD-10-CM

## 2013-08-07 DIAGNOSIS — M7989 Other specified soft tissue disorders: Secondary | ICD-10-CM | POA: Diagnosis not present

## 2013-08-07 LAB — ANAEROBIC CULTURE

## 2013-08-07 NOTE — Progress Notes (Signed)
Subjective: Patient reports left foot pain/dysfunction - no change  Objective: Vital signs in last 24 hours: Temp:  [97.7 F (36.5 C)-99.5 F (37.5 C)] 97.9 F (36.6 C) (01/11 0517) Pulse Rate:  [72-82] 72 (01/11 0517) Resp:  [16-22] 18 (01/11 0517) BP: (126-160)/(44-67) 150/58 mmHg (01/11 0517) SpO2:  [95 %-100 %] 99 % (01/11 0517)  Intake/Output from previous day: 01/10 0701 - 01/11 0700 In: 575  Out: 3200 [Urine:3200] Intake/Output this shift: Total I/O In: 575 [Other:575] Out: 2500 [Urine:2500]  PE left foot edema, hypersensitive, weakness persists  Lab Results:  Recent Labs  08/04/13 1955 08/06/13 0544  WBC 8.3 6.5  HGB 9.4* 9.2*  HCT 28.0* 27.3*  PLT 299 334   BMET  Recent Labs  08/04/13 1955 08/06/13 0544  NA 136* 135*  K 3.9 4.4  CL 97 98  CO2 27 28  GLUCOSE 94 117*  BUN 9 9  CREATININE 0.91 0.95  CALCIUM 8.6 8.5    Studies/Results: Venous dopplers pending  Assessment/Plan: CPM   LOS: 9 days     Nickolus Wadding R, MD 08/07/2013, 6:19 AM

## 2013-08-07 NOTE — Progress Notes (Signed)
*  PRELIMINARY RESULTS* Vascular Ultrasound Left lower extremity venous duplex has been completed.  Preliminary findings: no evidence of DVT in visualized veins.   Farrel DemarkJill Eunice, RDMS, RVT  08/07/2013, 10:15 AM

## 2013-08-08 ENCOUNTER — Inpatient Hospital Stay (HOSPITAL_COMMUNITY)
Admission: RE | Admit: 2013-08-08 | Discharge: 2013-08-19 | DRG: 945 | Disposition: A | Discharge status: Home Health | Payer: Medicare Other | Source: Intra-hospital | Attending: Physical Medicine & Rehabilitation | Admitting: Physical Medicine & Rehabilitation

## 2013-08-08 ENCOUNTER — Inpatient Hospital Stay (HOSPITAL_COMMUNITY): Payer: Medicare Other

## 2013-08-08 DIAGNOSIS — M545 Low back pain, unspecified: Secondary | ICD-10-CM | POA: Diagnosis not present

## 2013-08-08 DIAGNOSIS — I2510 Atherosclerotic heart disease of native coronary artery without angina pectoris: Secondary | ICD-10-CM | POA: Diagnosis present

## 2013-08-08 DIAGNOSIS — M129 Arthropathy, unspecified: Secondary | ICD-10-CM | POA: Diagnosis present

## 2013-08-08 DIAGNOSIS — Z5189 Encounter for other specified aftercare: Secondary | ICD-10-CM | POA: Diagnosis not present

## 2013-08-08 DIAGNOSIS — T8140XA Infection following a procedure, unspecified, initial encounter: Secondary | ICD-10-CM | POA: Diagnosis present

## 2013-08-08 DIAGNOSIS — D649 Anemia, unspecified: Secondary | ICD-10-CM | POA: Diagnosis present

## 2013-08-08 DIAGNOSIS — M109 Gout, unspecified: Secondary | ICD-10-CM | POA: Diagnosis not present

## 2013-08-08 DIAGNOSIS — R197 Diarrhea, unspecified: Secondary | ICD-10-CM | POA: Diagnosis not present

## 2013-08-08 DIAGNOSIS — G834 Cauda equina syndrome: Secondary | ICD-10-CM | POA: Diagnosis present

## 2013-08-08 DIAGNOSIS — N319 Neuromuscular dysfunction of bladder, unspecified: Secondary | ICD-10-CM | POA: Diagnosis present

## 2013-08-08 DIAGNOSIS — Z981 Arthrodesis status: Secondary | ICD-10-CM | POA: Diagnosis not present

## 2013-08-08 DIAGNOSIS — G8918 Other acute postprocedural pain: Secondary | ICD-10-CM

## 2013-08-08 DIAGNOSIS — Z951 Presence of aortocoronary bypass graft: Secondary | ICD-10-CM

## 2013-08-08 DIAGNOSIS — K592 Neurogenic bowel, not elsewhere classified: Secondary | ICD-10-CM | POA: Diagnosis present

## 2013-08-08 DIAGNOSIS — Z9861 Coronary angioplasty status: Secondary | ICD-10-CM

## 2013-08-08 DIAGNOSIS — I10 Essential (primary) hypertension: Secondary | ICD-10-CM | POA: Diagnosis present

## 2013-08-08 DIAGNOSIS — F411 Generalized anxiety disorder: Secondary | ICD-10-CM | POA: Diagnosis present

## 2013-08-08 DIAGNOSIS — T8149XA Infection following a procedure, other surgical site, initial encounter: Secondary | ICD-10-CM | POA: Diagnosis present

## 2013-08-08 DIAGNOSIS — E785 Hyperlipidemia, unspecified: Secondary | ICD-10-CM | POA: Diagnosis present

## 2013-08-08 DIAGNOSIS — M4716 Other spondylosis with myelopathy, lumbar region: Secondary | ICD-10-CM

## 2013-08-08 LAB — BASIC METABOLIC PANEL
BUN: 10 mg/dL (ref 6–23)
CALCIUM: 8.9 mg/dL (ref 8.4–10.5)
CO2: 27 mEq/L (ref 19–32)
Chloride: 101 mEq/L (ref 96–112)
Creatinine, Ser: 1.02 mg/dL (ref 0.50–1.35)
GFR calc Af Amer: 84 mL/min — ABNORMAL LOW (ref >90)
GFR, EST NON AFRICAN AMERICAN: 72 mL/min — AB (ref >90)
GLUCOSE: 141 mg/dL — AB (ref 70–99)
Potassium: 4.5 mEq/L (ref 3.7–5.3)
SODIUM: 138 meq/L (ref 137–147)

## 2013-08-08 LAB — CBC WITH DIFFERENTIAL/PLATELET
Basophils Absolute: 0 10*3/uL (ref 0.0–0.1)
Basophils Relative: 0 % (ref 0–1)
Eosinophils Absolute: 0.8 10*3/uL — ABNORMAL HIGH (ref 0.0–0.7)
Eosinophils Relative: 12 % — ABNORMAL HIGH (ref 0–5)
HCT: 29.7 % — ABNORMAL LOW (ref 39.0–52.0)
Hemoglobin: 10.1 g/dL — ABNORMAL LOW (ref 13.0–17.0)
LYMPHS ABS: 0.9 10*3/uL (ref 0.7–4.0)
Lymphocytes Relative: 13 % (ref 12–46)
MCH: 29.6 pg (ref 26.0–34.0)
MCHC: 34 g/dL (ref 30.0–36.0)
MCV: 87.1 fL (ref 78.0–100.0)
Monocytes Absolute: 0.7 10*3/uL (ref 0.1–1.0)
Monocytes Relative: 10 % (ref 3–12)
Neutro Abs: 4.1 10*3/uL (ref 1.7–7.7)
Neutrophils Relative %: 65 % (ref 43–77)
Platelets: 462 10*3/uL — ABNORMAL HIGH (ref 150–400)
RBC: 3.41 MIL/uL — AB (ref 4.22–5.81)
RDW: 14.4 % (ref 11.5–15.5)
WBC: 6.4 10*3/uL (ref 4.0–10.5)

## 2013-08-08 MED ORDER — NYSTATIN 100000 UNIT/GM EX CREA
TOPICAL_CREAM | Freq: Two times a day (BID) | CUTANEOUS | Status: DC
  Administered 2013-08-08 – 2013-08-13 (×10): via TOPICAL
  Administered 2013-08-13: 1 via TOPICAL
  Administered 2013-08-14 – 2013-08-19 (×10): via TOPICAL
  Filled 2013-08-08 (×2): qty 15

## 2013-08-08 MED ORDER — SODIUM CHLORIDE 0.9 % IJ SOLN: 9.0000 mL | INTRAMUSCULAR | Status: DC | PRN

## 2013-08-08 MED ORDER — VITAMIN C 500 MG PO TABS
500.0000 mg | ORAL_TABLET | Freq: Every day | ORAL | Status: DC
  Administered 2013-08-09 – 2013-08-19 (×11): 500 mg via ORAL
  Filled 2013-08-08 (×12): qty 1

## 2013-08-08 MED ORDER — CITALOPRAM HYDROBROMIDE 40 MG PO TABS
40.0000 mg | ORAL_TABLET | Freq: Every day | ORAL | Status: DC
  Administered 2013-08-09 – 2013-08-19 (×11): 40 mg via ORAL
  Filled 2013-08-08 (×12): qty 1

## 2013-08-08 MED ORDER — BETHANECHOL CHLORIDE 25 MG PO TABS
50.0000 mg | ORAL_TABLET | Freq: Four times a day (QID) | ORAL | Status: DC
  Administered 2013-08-08 – 2013-08-10 (×9): 50 mg via ORAL
  Filled 2013-08-08 (×14): qty 2

## 2013-08-08 MED ORDER — PROCHLORPERAZINE EDISYLATE 5 MG/ML IJ SOLN
5.0000 mg | Freq: Four times a day (QID) | INTRAMUSCULAR | Status: DC | PRN
  Administered 2013-08-16: 10 mg via INTRAMUSCULAR
  Filled 2013-08-08 (×2): qty 2

## 2013-08-08 MED ORDER — PHENOL 1.4 % MT LIQD
1.0000 | OROMUCOSAL | Status: DC | PRN
  Filled 2013-08-08: qty 177

## 2013-08-08 MED ORDER — PROCHLORPERAZINE MALEATE 5 MG PO TABS
5.0000 mg | ORAL_TABLET | Freq: Four times a day (QID) | ORAL | Status: DC | PRN
  Administered 2013-08-09: 10 mg via ORAL
  Filled 2013-08-08: qty 2

## 2013-08-08 MED ORDER — DIPHENHYDRAMINE HCL 12.5 MG/5ML PO ELIX
12.5000 mg | ORAL_SOLUTION | Freq: Four times a day (QID) | ORAL | Status: DC | PRN
  Administered 2013-08-12: 25 mg via ORAL
  Filled 2013-08-08: qty 10

## 2013-08-08 MED ORDER — ALUM & MAG HYDROXIDE-SIMETH 200-200-20 MG/5ML PO SUSP: 30.0000 mL | ORAL | Status: DC | PRN

## 2013-08-08 MED ORDER — GABAPENTIN 300 MG PO CAPS
600.0000 mg | ORAL_CAPSULE | Freq: Three times a day (TID) | ORAL | Status: DC
  Administered 2013-08-08 – 2013-08-19 (×32): 600 mg via ORAL
  Filled 2013-08-08 (×35): qty 2

## 2013-08-08 MED ORDER — VANCOMYCIN HCL IN DEXTROSE 1-5 GM/200ML-% IV SOLN
1000.0000 mg | Freq: Two times a day (BID) | INTRAVENOUS | Status: DC
  Administered 2013-08-08 – 2013-08-09 (×2): 1000 mg via INTRAVENOUS
  Filled 2013-08-08 (×4): qty 200

## 2013-08-08 MED ORDER — RIFAMPIN 300 MG PO CAPS
300.0000 mg | ORAL_CAPSULE | Freq: Two times a day (BID) | ORAL | Status: DC
  Administered 2013-08-08 – 2013-08-19 (×22): 300 mg via ORAL
  Filled 2013-08-08 (×24): qty 1

## 2013-08-08 MED ORDER — FLUCONAZOLE 100 MG PO TABS
100.0000 mg | ORAL_TABLET | Freq: Every day | ORAL | Status: AC
  Administered 2013-08-08 – 2013-08-12 (×5): 100 mg via ORAL
  Filled 2013-08-08 (×7): qty 1

## 2013-08-08 MED ORDER — GUAIFENESIN 100 MG/5ML PO SYRP
200.0000 mg | ORAL_SOLUTION | ORAL | Status: DC | PRN
  Filled 2013-08-08: qty 10

## 2013-08-08 MED ORDER — ENOXAPARIN SODIUM 40 MG/0.4ML ~~LOC~~ SOLN
40.0000 mg | SUBCUTANEOUS | Status: DC
  Administered 2013-08-08 – 2013-08-18 (×11): 40 mg via SUBCUTANEOUS
  Filled 2013-08-08 (×12): qty 0.4

## 2013-08-08 MED ORDER — OXYCODONE HCL 5 MG PO TABS
10.0000 mg | ORAL_TABLET | ORAL | Status: DC | PRN
  Administered 2013-08-08 – 2013-08-09 (×5): 10 mg via ORAL
  Filled 2013-08-08 (×5): qty 2

## 2013-08-08 MED ORDER — METHOCARBAMOL 100 MG/ML IJ SOLN: 1000.0000 mg | Freq: Four times a day (QID) | INTRAVENOUS | Status: DC | PRN

## 2013-08-08 MED ORDER — ACETAMINOPHEN 325 MG PO TABS
325.0000 mg | ORAL_TABLET | ORAL | Status: DC | PRN
  Administered 2013-08-10: 650 mg via ORAL
  Filled 2013-08-08: qty 2

## 2013-08-08 MED ORDER — TRAMADOL HCL 50 MG PO TABS
50.0000 mg | ORAL_TABLET | Freq: Four times a day (QID) | ORAL | Status: DC | PRN
  Administered 2013-08-09 – 2013-08-15 (×6): 50 mg via ORAL
  Filled 2013-08-08 (×6): qty 1

## 2013-08-08 MED ORDER — NITROGLYCERIN 0.4 MG SL SUBL: 0.4000 mg | SUBLINGUAL_TABLET | SUBLINGUAL | Status: DC | PRN

## 2013-08-08 MED ORDER — MENTHOL 3 MG MT LOZG
1.0000 | LOZENGE | OROMUCOSAL | Status: DC | PRN
  Filled 2013-08-08: qty 9

## 2013-08-08 MED ORDER — PROCHLORPERAZINE 25 MG RE SUPP
12.5000 mg | Freq: Four times a day (QID) | RECTAL | Status: DC | PRN
  Filled 2013-08-08: qty 1

## 2013-08-08 MED ORDER — NALOXONE HCL 0.4 MG/ML IJ SOLN: 0.4000 mg | INTRAMUSCULAR | Status: DC | PRN

## 2013-08-08 MED ORDER — TAMSULOSIN HCL 0.4 MG PO CAPS
0.4000 mg | ORAL_CAPSULE | Freq: Every day | ORAL | Status: DC
  Administered 2013-08-09 – 2013-08-19 (×11): 0.4 mg via ORAL
  Filled 2013-08-08 (×13): qty 1

## 2013-08-08 MED ORDER — GUAIFENESIN-DM 100-10 MG/5ML PO SYRP: 5.0000 mL | ORAL_SOLUTION | Freq: Four times a day (QID) | ORAL | Status: DC | PRN

## 2013-08-08 MED ORDER — DOXAZOSIN MESYLATE 8 MG PO TABS
8.0000 mg | ORAL_TABLET | Freq: Every day | ORAL | Status: DC
  Administered 2013-08-08 – 2013-08-10 (×3): 8 mg via ORAL
  Filled 2013-08-08 (×4): qty 1

## 2013-08-08 MED ORDER — ZOLPIDEM TARTRATE 5 MG PO TABS
5.0000 mg | ORAL_TABLET | Freq: Every evening | ORAL | Status: DC | PRN
  Filled 2013-08-08: qty 1

## 2013-08-08 MED ORDER — DEXTROSE 5 % IV SOLN
2.0000 g | INTRAVENOUS | Status: DC
  Administered 2013-08-08 – 2013-08-18 (×11): 2 g via INTRAVENOUS
  Filled 2013-08-08 (×13): qty 2

## 2013-08-08 NOTE — H&P (View-Only) (Signed)
Physical Medicine and Rehabilitation Admission H&P    Chief Complaint  Patient presents with  . Cauda equina syndrome with postoperative infection requiring incision and drainage    HPI: [name] is a 73 y.o. right-handed male well known to rehabilitation services from recent stay (11/26--07/08/2013) for  lumbar L4-S1 decompression complicated by CSF leak and cauda equina syndrome. He was readmitted on 07/30/2013 with progressive low back pain  X 3-4 days. MRI of the lumbar spine showed epidural fluid collection and some ring-enhancing fluid collections in the soft tissues. He was placed on broad-spectrum antibiotics and ID consulted for input. They recommended fluid cultures as well as continuing broad spectrum antibiotics. Patient underwent exploration of lumbar wound with removal of fibrosis and decompression of the thecal sac and the right side as well as lumbar L3 and L5 nerve root 08/03/2013 per Dr. Joya Salm.   Blood cultures, wound cultures negative but patient received antibiotics at home prior to admission and ID recommends continuing antibiotics--final recommendations pending. Patient with LLE pain with edema as well as pain/difficulty with weight bearing. LLE dopplers negative for DVT.  Ortho was consulted due to left shoulder pain.  Shoulder aspiration and patient treated with steroids for L-RTC tear with exacerbated symptoms.  He continues with bowel incontinence due to cauda equina syndrome as well as drainage from incision. CIR recommended by therapy team and patient admitted today.   Review of Systems  HENT: Negative for hearing loss.   Eyes: Negative for blurred vision and double vision.  Respiratory: Negative for cough and shortness of breath.   Cardiovascular: Negative for chest pain and palpitations.  Gastrointestinal: Positive for diarrhea. Negative for heartburn and nausea.       Anorexia  Musculoskeletal: Positive for back pain and myalgias.  Neurological: Positive  for sensory change (Continues with saddle numbness. ) and focal weakness. Negative for headaches.  Psychiatric/Behavioral: The patient is nervous/anxious.     Past Medical History  Diagnosis Date  . Hyperlipidemia   . Small bowel problem     HAD ALOT OF SCAR TISSUE FROM PREVIOUS SURGERIES..NG WAS INSERTED ...Marland KitchenMarland KitchenNO SURGERY NEEDED...Marland KitchenMarland KitchenIN FOR 8 DAYS  . Disorder of blood     BEEN TREATED BY DERMATOLOGIST X 4 YRS..."BLOOD BLISTERS"  . Arthritis   . Gout   . Anxiety   . Hx MRSA infection     rt shoulder  . Pneumonia     "I've had it 3-4 times"  . Coronary artery disease     IN 2000   STENT PLACED IN 2012 sees Dr. Lattie Haw, saw last 2013  . HTN (hypertension)     sees Dr. [name] in [place]  . Small bowel obstruction   . Ventral hernia   . Acute renal failure 04/17/2013  . 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 Sternal surg  2000    HAS HAD 5-6 ON HIS STERNUM; "caught MRSA in it"  . Lumbar laminectomy/decompression microdiscectomy  06/13/2011    Procedure: LUMBAR LAMINECTOMY/DECOMPRESSION MICRODISCECTOMY;  Surgeon: Floyce Stakes;  Location: [facility];  Service: Neurosurgery;  Laterality: N/A;  Lumbar three, lumbar four-five Laminectomy  . Eye surgery      bilateral cataract  . Anterior cervical corpectomy  12/17/11  . Incision and drainage of wound  ~ 20ll; 12/03/11    "had infection in my right"  . Shoulder open rotator cuff repair  ~ 2011    right  .  Peripherally inserted central catheter insertion  2011 & 11/2011  . Cataract extraction w/ intraocular lens  implant, bilateral  ? 2011  . Coronary artery bypass graft  2000    CABG X5  . Back surgery      lumbar  . Tonsillectomy  1949  . Cholecystectomy  2006 "or after"  . Coronary angioplasty with stent placement  2012  . Anterior cervical corpectomy  12/17/2011    Procedure: ANTERIOR CERVICAL CORPECTOMY;  Surgeon: Floyce Stakes, MD;  Location: [facility];  Service: Neurosurgery;  Laterality: N/A;  Anterior Cervical Decompression Fusion Five to Thoracic Two with plating  . Lumbar laminectomy/decompression microdiscectomy Right 06/17/2013    Procedure: LUMBAR LAMINECTOMY/DECOMPRESSION MICRODISCECTOMY 1 LEVEL;  Surgeon: Floyce Stakes, MD;  Location: [facility];  Service: Neurosurgery;  Laterality: Right;  Right L3-4 Microdiskectomy  . Lumbar wound debridement N/A 08/02/2013    Procedure: INCISION AND DRAINAGE OF LUMBAR WOUND DEBRIDEMENT;  Surgeon: Floyce Stakes, MD;  Location: [place];  Service: Neurosurgery;  Laterality: N/A;   Family History  Problem Relation Age of Onset  . Heart disease    . Hypertension Mother   . Hypertension Maternal Aunt   . Hypertension Maternal Uncle   . Anesthesia problems Neg Hx   . Hypotension Neg Hx   . Malignant hyperthermia Neg Hx   . Pseudochol deficiency Neg Hx    Social History: Married. 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 He reports that he quit smoking about 14 years ago. His smoking use included Cigarettes. He has a 1 pack-year smoking history. He has never used smokeless tobacco. He reports that he does not drink alcohol or use illicit drugs.    Allergies  Allergen Reactions  . Morphine Other (See Comments)    REACTION: made him go crazy; "I had fun w/it; my family didn't think it was too funny"  . Metoprolol Other (See Comments)    REACTION:  Tachycardia; "don't remember how bad it was; it was so long ago"    Medications Prior to Admission  Medication Sig Dispense Refill  . atorvastatin (LIPITOR) 40 MG tablet Take 1 tablet (40 mg total) by mouth daily.  90 tablet  2  . bethanechol (URECHOLINE) 50 MG tablet Take 1 tablet (50 mg total) by mouth 4 (four) times daily.  120 tablet  1  . citalopram (CELEXA) 40 MG tablet Take 40 mg by mouth daily.      . Cyanocobalamin (VITAMIN B 12 PO) Take 1 tablet by mouth daily.      Marland Kitchen doxazosin (CARDURA) 4 MG tablet Take 8 mg by mouth at  bedtime.      . gabapentin (NEURONTIN) 300 MG capsule Take 1 capsule (300 mg total) by mouth 3 (three) times daily.  90 capsule  1  . oxyCODONE (ROXICODONE) 15 MG immediate release tablet Take 7.5 mg by mouth every 4 (four) hours as needed. For pain      . predniSONE (DELTASONE) 5 MG tablet Take 5 mg by mouth daily.      . tamsulosin (FLOMAX) 0.4 MG CAPS capsule Take 1 capsule (0.4 mg total) by mouth daily after breakfast.  30 capsule  1  . vitamin C (ASCORBIC ACID) 500 MG tablet Take 500 mg by mouth daily.      Marland Kitchen VITAMIN D, CHOLECALCIFEROL, PO Take 2-3 tablets by mouth daily.      Marland Kitchen VITAMIN E PO Take 2-3 tablets by mouth daily.      Marland Kitchen  nitroGLYCERIN (NITROSTAT) 0.4 MG SL tablet Place 0.4 mg under the tongue every 5 (five) minutes as needed. For chest pain        Home: Home Living Family/patient expects to be discharged to:: Private residence Living Arrangements: Spouse/significant other Available Help at Discharge: Family;Available 24 hours/day Type of Home: House Home Access: Stairs to enter [employer] of Steps: 1 from back entrance; 3-4 from front entrance Entrance Stairs-Rails: None Home Layout: One level Home Equipment: Walker - 2 wheels;Cane - single point;Bedside commode;Grab bars - tub/shower;Wheelchair - Scientist, physiological: Reacher Additional Comments: wife has a Secondary school teacher  Lives With: Spouse   Functional History: Prior Function Comments: prior to first back sx in November 2014  Functional Status:  Mobility:     Ambulation/Gait Ambulation Distance (Feet): 3 Feet Gait velocity: decreased General Gait Details: not assessed secondary to pain, decreased activity tolerance.    ADL: ADL Eating/Feeding: Supervision/safety Where Assessed - Eating/Feeding: Edge of bed Grooming: Wash/dry face;Teeth care;Set up;Supervision/safety Where Assessed - Grooming:  (sitting EOB) Lower Body Bathing: +1 Total assistance Where Assessed - Lower Body  Bathing: Supine, head of bed up;Supine, head of bed flat;Rolling right and/or left Lower Body Dressing: +1 Total assistance Where Assessed - Lower Body Dressing: Supine, head of bed up;Supine, head of bed flat;Rolling right and/or left Tub/Shower Transfer Method: Not assessed Transfers/Ambulation Related to ADLs: Not assessed ADL Comments: Pt. ed. on use of AE for LE and UE ADL's and back sheet given. Pt. declined to return demo secondary to nuasea.   Cognition: Cognition Overall Cognitive Status: Within Functional Limits for tasks assessed Orientation Level: Oriented X4 Cognition Arousal/Alertness: Awake/alert Behavior During Therapy: WFL for tasks assessed/performed Overall Cognitive Status: Within Functional Limits for tasks assessed  Physical Exam: Blood pressure 131/62, pulse 80, temperature 97.8 F (36.6 C), temperature source Oral, resp. rate 18, height 6' (1.829 m), weight 81.602 kg (179 lb 14.4 oz), SpO2 99.00%. Physical Exam  Nursing note and vitals reviewed. Constitutional: He is oriented to person, place, and time. He appears well-developed and well-nourished.  HENT:  Head: Normocephalic and atraumatic.  Right Ear: External ear normal.  Left Ear: External ear normal.  Eyes: Conjunctivae are normal. Pupils are equal, round, and reactive to light.  Neck: Normal range of motion. Neck supple. No JVD present. No tracheal deviation present. No thyromegaly present.  Cardiovascular: Normal rate, regular rhythm and normal heart sounds.   Respiratory: Effort normal and breath sounds normal. No respiratory distress. He has no wheezes. He has no rales.  GI: Soft. Bowel sounds are normal. He exhibits no distension. There is no tenderness. There is no rebound.  Abdominal wall weakness due to prior surgery.   Musculoskeletal: He exhibits tenderness (BLE shin/ankle with areas of dysesthesias. ).  Right forearm with reddened and edematous area--prior site of IV infiltration?  Bilateral  feet with 2- + pitting edema. Left foot drop.    Lymphadenopathy:    He has no cervical adenopathy.  Neurological: He is alert and oriented to person, place, and time.  4+/5 strength bilateral UE's proximal to distal. Normal sensation in bilateral upper extremities Absent sensation left foot but intact above the ankle Left lower extremity strength 2+ to 3 minus hip flexion, 3+ knee extension 1/5 ankle plantarflexion 0/5 ankle dorsiflexion Near normal sensation right foot and right lower extremity Right lower extremity strength is 3/5 hip flexion knee extension, 2+ to 3 minus ankle dorsiflexion and plantarflexion      Skin: Skin is warm.  Left  buttock with chapped area and intergluteal irritation noted. Perineum abraded, peeling reddened skin under a layer of cream. Lower back incision open to air--clean, dry and intact at present time.   Psychiatric: His speech is normal. Judgment and thought content normal. His mood appears anxious. He is withdrawn. Cognition and memory are normal.    Results for orders placed during the hospital encounter of 07/29/13 (from the past 48 hour(s))  CBC WITH DIFFERENTIAL     Status: Abnormal   Collection Time    08/08/13 11:55 AM      Result Value Range   WBC 6.4  4.0 - 10.5 K/uL   RBC 3.41 (*) 4.22 - 5.81 MIL/uL   Hemoglobin 10.1 (*) 13.0 - 17.0 g/dL   HCT 29.7 (*) 39.0 - 52.0 %   MCV 87.1  78.0 - 100.0 fL   MCH 29.6  26.0 - 34.0 pg   MCHC 34.0  30.0 - 36.0 g/dL   RDW 14.4  11.5 - 15.5 %   Platelets 462 (*) 150 - 400 K/uL   Neutrophils Relative % 65  43 - 77 %   Neutro Abs 4.1  1.7 - 7.7 K/uL   Lymphocytes Relative 13  12 - 46 %   Lymphs Abs 0.9  0.7 - 4.0 K/uL   Monocytes Relative 10  3 - 12 %   Monocytes Absolute 0.7  0.1 - 1.0 K/uL   Eosinophils Relative 12 (*) 0 - 5 %   Eosinophils Absolute 0.8 (*) 0.0 - 0.7 K/uL   Basophils Relative 0  0 - 1 %   Basophils Absolute 0.0  0.0 - 0.1 K/uL  BASIC METABOLIC PANEL     Status: Abnormal    Collection Time    08/08/13 11:55 AM      Result Value Range   Sodium 138  137 - 147 mEq/L   Potassium 4.5  3.7 - 5.3 mEq/L   Chloride 101  96 - 112 mEq/L   CO2 27  19 - 32 mEq/L   Glucose, Bld 141 (*) 70 - 99 mg/dL   BUN 10  6 - 23 mg/dL   Creatinine, Ser 1.02  0.50 - 1.35 mg/dL   Calcium 8.9  8.4 - 10.5 mg/dL   GFR calc non Af Amer 72 (*) >90 mL/min   GFR calc Af Amer 84 (*) >90 mL/min   Comment: (NOTE)     The eGFR has been calculated using the CKD EPI equation.     This calculation has not been validated in all clinical situations.     eGFR's persistently <90 mL/min signify possible Chronic Kidney     Disease.   No results found.  Post Admission Physician Evaluation: 1. Functional deficits secondary  to cauda equina syndrome s/p decompression with subsequent wound infection, operative site fluid collection/scarring and surgical re-exploration and decompression. 2. Patient is admitted to receive collaborative, interdisciplinary care between the physiatrist, rehab nursing staff, and therapy team. 3. Patient's level of medical complexity and substantial therapy needs in context of that medical necessity cannot be provided at a lesser intensity of care such as a SNF. 4. Patient has experienced substantial functional loss from his/her baseline which was documented above under the "Functional History" and "Functional Status" headings.  Judging by the patient's diagnosis, physical exam, and functional history, the patient has potential for functional progress which will result in measurable gains while on inpatient rehab.  These gains will be of substantial and practical use upon discharge  in  facilitating mobility and self-care at the household level. 5. Physiatrist will provide 24 hour management of medical needs as well as oversight of the therapy plan/treatment and provide guidance as appropriate regarding the interaction of the two. 6. 24 hour rehab nursing will assist with bladder  management, bowel management, safety, skin/wound care, disease management, medication administration, pain management and patient education  and help integrate therapy concepts, techniques,education, etc. 7. PT will assess and treat for/with: Lower extremity strength, range of motion, stamina, balance, functional mobility, safety, adaptive techniques and equipment, NMR, pain mgt, back precautions.   Goals are: min to mod assist . 8. OT will assess and treat for/with: ADL's, functional mobility, safety, upper extremity strength, adaptive techniques and equipmen, NMR, pain mgt, back precautions.   Goals are: min to mod assist. 9. SLP will assess and treat for/with: n/a.  Goals are: n/a. 10. Case Management and Social Worker will assess and treat for psychological issues and discharge planning. 11. Team conference will be held weekly to assess progress toward goals and to determine barriers to discharge. 12. Patient will receive at least 3 hours of therapy per day at least 5 days per week. 13. ELOS: 15-23 days (depending upon acute neurological recovery)       14. Prognosis:  good   Medical Problem List and Plan: Cauda equina syndrome post lumbar decompression with wound infection.  1. DVT Prophylaxis/Anticoagulation: Pharmaceutical: Lovenox 2. Pain Management:  Continue neurontin for dysesthesias--may need additional medication to help with symptoms.  3. Mood: Team to provide ego support for elevated levels of anxiety. :CSW to follow for evaluation and support.  4. Neuropsych: This patient is capable of making decisions on his own behalf. 5. Neurogenic bladder:  Continue Flomax, Cardura and urecholine. Continue foley for now. Will treat yeast infection with short course of diflucan.  6. Neurogenic bowel:  Will re-establish bowel program. Suppository daily in am. Check KUB.  7. Anemia:  Will recheck in am.  8. Skin: Candidal irritation perineal area as well as MAST left buttock  due to bowel  incontinence compounded by yeast  Infection due to antibiotics. Keep area open to air,  air mattress overlay.Diflucan x 5 days. Will check KUB to decide on need for aggressive bowel program.  Anticipate improvement once incontinence resolves.   Meredith Staggers, MD, [place] [facility]   08/08/2013

## 2013-08-08 NOTE — Interval H&P Note (Signed)
Jake MarvelRonald W [name] was admitted today to Inpatient Rehabilitation with the diagnosis of cauda equina syndrome/post-op infection.  The patient's history has been reviewed, patient examined, and there is no change in status.  Patient continues to be appropriate for intensive inpatient rehabilitation.  I have reviewed the patient's chart and labs.  Questions were answered to the patient's satisfaction.  SWARTZ,ZACHARY T 08/08/2013, 9:47 PM

## 2013-08-08 NOTE — Progress Notes (Signed)
Physical Therapy Treatment Patient Details Name: Ignacia MarvelRonald W Perazzo MRN: [id number] DOB: 04/06/1941 Today's Date: 08/08/2013 Time: [id number] PT Time Calculation (min): 40 min  PT Assessment / Plan / Recommendation  History of Present Illness Pt is a 73 yo male whoe had initial spinal surgery in November 2014, who presents now s/p INCISION AND DRAINAGE OF LUMBAR WOUND DEBRIDEMENT. (Simultaneous filing. User may not have seen previous data.)   PT Comments   Patient demonstrates some improvements in activity tolerance today, tolerated pericare during bed mobility as well as pericare following transfer to chair. Patient still unable to recognize when having BM. Patient able to tolerate periods of standing with assist for stability. Pain overall limiting factor. Tolerated some EOB there-ex. Will continue to see and progress as tolerated.    Follow Up Recommendations  CIR     Does the patient have the potential to tolerate intense rehabilitation   Yes     Equipment Recommendations  Rolling walker with 5" wheels    Recommendations for Other Services Rehab consult  Frequency Min 5X/week   Progress towards PT Goals Progress towards PT goals: Progressing toward goals (modestly)  Plan Current plan remains appropriate    Precautions / Restrictions Precautions Precautions: Back;Fall Precaution Booklet Issued: No Precaution Comments: Reviewed back precautions with pt. Required Braces or Orthoses:  (has MAFO for L ankle) Restrictions Weight Bearing Restrictions: No   Pertinent Vitals/Pain 6/10    Mobility  Bed Mobility Overal bed mobility: Needs Assistance Bed Mobility: Rolling;Sidelying to Sit;Sit to Sidelying Rolling: Min assist Sidelying to sit: Mod assist Sit to sidelying: Mod assist General bed mobility comments: assist for trunk elevation and control of LLE Transfers Overall transfer level: Needs assistance Equipment used: Rolling walker (2 wheeled) Transfers: Sit to/from  BJ'sStand;Stand Pivot Transfers Sit to Stand: +2 physical assistance Stand pivot transfers: +2 physical assistance General transfer comment: VCs for increased use of RLE, assist to maintain upright,  Ambulation/Gait Ambulation/Gait assistance: +2 physical assistance Ambulation Distance (Feet): 3 Feet Assistive device: Rolling walker (2 wheeled) Gait Pattern/deviations: Step-to pattern;Shuffle Gait velocity: decreased    Exercises General Exercises - Lower Extremity Ankle Circles/Pumps: AROM;AAROM;Both;10 reps Long Arc Quad: AROM;AAROM;Both;10 reps Hip Flexion/Marching: AROM;AAROM;Both;10 reps     PT Goals (current goals can now be found in the care plan section) Acute Rehab PT Goals Patient Stated Goal: not stated PT Goal Formulation: With patient Time For Goal Achievement: 08/17/13 Potential to Achieve Goals: Good  Visit Information  Last PT Received On: 08/08/13 Assistance Needed: +2 OT goals addressed during session: Proper use of Adaptive equipment and DME History of Present Illness: Pt is a 73 yo male whoe had initial spinal surgery in November 2014, who presents now s/p INCISION AND DRAINAGE OF LUMBAR WOUND DEBRIDEMENT. (Simultaneous filing. User may not have seen previous data.)    Subjective Data  Subjective: I feel a little better today Patient Stated Goal: not stated   Cognition  Cognition Arousal/Alertness: Awake/alert Behavior During Therapy: WFL for tasks assessed/performed Overall Cognitive Status: Within Functional Limits for tasks assessed    Balance  Balance Overall balance assessment: Needs assistance Sitting-balance support: Feet supported Sitting balance-Leahy Scale: Good Standing balance support: Bilateral upper extremity supported;During functional activity Standing balance-Leahy Scale: Poor Standing balance comment: tolerated standing for approximately 30 seconds today with assist for stability, performed x 2 for hygiene purposes General  Comments General comments (skin integrity, edema, etc.): while sitting upright EOB there was a visible fluid collection at the superior region of incision, nursing notified,  additionally, patient with some erythema and itchiness around incision. patient continues to have bowel dysfunction (unable to recognize when having BM) hygiene performed x2 during session. Patient educated on positioning for LLE and use of brace to preserve ankle ROM.  End of Session PT - End of Session Equipment Utilized During Treatment: Gait belt Activity Tolerance: Patient limited by pain Patient left: in bed;with call bell/phone within reach;with nursing/sitter in room Nurse Communication: Mobility status;Need for lift equipment;Patient requests pain meds;Other (comment) (pericare, +BM without recognition, wound check)   GP     Fabio Asa 08/08/2013, 12:22 PM Charlotte Crumb, PT DPT  [phone]

## 2013-08-08 NOTE — Progress Notes (Addendum)
INFECTIOUS DISEASE PROGRESS NOTE  ID: [name] is a 73 y.o. male with  Active Problems:   Post-operative pain   Lumbar degenerative disc disease  Subjective: Improvement with pain management. Able to sit in chair  Abtx:  Anti-infectives   Start     Dose/Rate Route Frequency Ordered Stop   08/02/13 1900  ceFAZolin (ANCEF) IVPB 1 g/50 mL premix  Status:  Discontinued     1 g 100 mL/hr over 30 Minutes Intravenous Every 8 hours 08/02/13 1853 08/02/13 1854   08/01/13 2200  cefTRIAXone (ROCEPHIN) 2 g in dextrose 5 % 50 mL IVPB     2 g 100 mL/hr over 30 Minutes Intravenous Every 24 hours 08/01/13 0929     08/01/13 2200  vancomycin (VANCOCIN) IVPB 1000 mg/200 mL premix     1,000 mg 200 mL/hr over 60 Minutes Intravenous Every 12 hours 08/01/13 1124     07/30/13 2200  vancomycin (VANCOCIN) IVPB 750 mg/150 ml premix  Status:  Discontinued     750 mg 150 mL/hr over 60 Minutes Intravenous Every 12 hours 07/30/13 0937 08/01/13 1124   07/30/13 1000  rifampin (RIFADIN) capsule 300 mg     300 mg Oral Every 12 hours 07/30/13 0908     07/30/13 1000  cefTRIAXone (ROCEPHIN) 1 g in dextrose 5 % 50 mL IVPB  Status:  Discontinued     1 g 100 mL/hr over 30 Minutes Intravenous Every 12 hours 07/30/13 0937 08/01/13 0929   07/30/13 0945  vancomycin (VANCOCIN) 1,250 mg in sodium chloride 0.9 % 250 mL IVPB     1,250 mg 166.7 mL/hr over 90 Minutes Intravenous NOW 07/30/13 0937 07/30/13 1217      Medications:  Scheduled: . bethanechol  50 mg Oral QID  . bupivacaine (PF)  5 mL Infiltration Once  . bupivacaine liposome  20 mL Infiltration Once  . cefTRIAXone (ROCEPHIN)  IV  2 g Intravenous Q24H  . citalopram  40 mg Oral Daily  . doxazosin  8 mg Oral QHS  . gabapentin  600 mg Oral TID  . heparin subcutaneous  5,000 Units Subcutaneous Q8H  . methylPREDNISolone acetate  40 mg Intra-articular Once  . rifampin  300 mg Oral Q12H  . sodium chloride  3 mL Intravenous Q12H  . tamsulosin  0.4 mg  Oral QPC breakfast  . vancomycin  1,000 mg Intravenous Q12H  . vitamin C  500 mg Oral Daily    Objective: Vital signs in last 24 hours: Temp:  [97.8 F (36.6 C)-100.1 F (37.8 C)] 98.9 F (37.2 C) (01/12 1350) Pulse Rate:  [69-85] 69 (01/12 1350) Resp:  [18-22] 18 (01/12 1350) BP: (120-148)/(52-71) 120/52 mmHg (01/12 1350) SpO2:  [95 %-100 %] 97 % (01/12 1350)   General appearance: alert, cooperative and no distress Resp: clear to auscultation bilaterally Cardio: regular rate and rhythm GI: normal findings: bowel sounds normal and soft, non-tender Extremities: L foot hyperesthetic, edematous.  Back wound is wet   Lab Results  Recent Labs  08/06/13 0544 08/08/13 1155  WBC 6.5 6.4  HGB 9.2* 10.1*  HCT 27.3* 29.7*  NA 135* 138  K 4.4 4.5  CL 98 101  CO2 28 27  BUN 9 10  CREATININE 0.95 1.02   Lab Results  Component Value Date   ESRSEDRATE 23* 07/30/2013   Lab Results  Component Value Date   CRP 2.4* 07/30/2013    Microbiology: Recent Results (from the past 240 hour(s))  CULTURE, BLOOD (ROUTINE X  2)     Status: None   Collection Time    07/30/13 10:10 AM      Result Value Range Status   Specimen Description BLOOD LEFT ARM   Final   Special Requests BOTTLES DRAWN AEROBIC ONLY 10CC   Final   Culture  Setup Time     Final   Value: 07/30/2013 18:10     [facility]   Culture     Final   Value: NO GROWTH 5 DAYS     [facility]formed at [facility]   Report Status 08/05/2013 FINAL   Final  CULTURE, BLOOD (ROUTINE X 2)     Status: None   Collection Time    07/30/13 10:15 AM      Result Value Range Status   Specimen Description BLOOD LEFT HAND   Final   Special Requests BOTTLES DRAWN AEROBIC ONLY 10CC   Final   Culture  Setup Time     Final   Value: 07/30/2013 18:10     Performed at [facility]   Culture     Final   Value: NO GROWTH 5 DAYS     Performed at [facility]   Report Status 08/05/2013 FINAL   Final    ANAEROBIC CULTURE     Status: None   Collection Time    08/02/13  4:06 PM      Result Value Range Status   Specimen Description WOUND BACK   Final   Special Requests PT ON VANCO ROCEPHIN   Final   Gram Stain     Final   Value: MODERATE WBC PRESENT,BOTH PMN AND MONONUCLEAR     NO ORGANISMS SEEN     Per[facility]     [facility]   Culture     Final   Value: NO ANAEROBES ISOLATED     Performed at [facility]   Report Status 08/07/2013 FINAL   Final  WOUND CULTURE     Status: None   Collection Time    08/02/13  4:06 PM      Result Value Range Status   Specimen Description WOUND BACK   Final   Special Requests PT ON VANCO ROCEPHIN   Final   Gram Stain     Final   Value: MODERATE WBC PRESENT,BOTH PMN AND MONONUCLEAR     NO ORGANISMS SEEN     [facility]     [facility] at [facility]   Culture     Final   Value: NO GROWTH 2 DAYS     Performed at [facility]   Report Status 08/04/2013 FINAL   Final  GRAM STAIN     Status: None   Collection Time    08/02/13  4:06 PM      Result Value Range Status   Specimen Description WOUND BACK   Final   Special Requests PT ON VANCO ROCEPHIN   Final   Gram Stain     Final   Value: MODERATE WBC PRESENT,BOTH PMN AND MONONUCLEAR     NO ORGANISMS SEEN   Report Status 08/02/2013 FINAL   Final  CULTURE, BLOOD (ROUTINE X 2)     Status: None   Collection Time    08/04/13  7:55 PM      Result Value Range Status   Specimen Description BLOOD LEFT ARM   Final   Special Requests BOTTLES DRAWN AEROBIC  AND ANAEROBIC 10CC   Final   Culture  Setup Time     Final   Value: 08/05/2013 01:12     Performed at [facility]   Culture     Final   Value:        BLOOD CULTURE RECEIVED NO GROWTH TO DATE CULTURE WILL BE HELD FOR 5 DAYS BEFORE ISSUING A FINAL NEGATIVE REPORT     Performed at [facility]   Report Status PENDING   Incomplete  CULTURE, BLOOD  (ROUTINE X 2)     Status: None   Collection Time    08/04/13  8:00 PM      Result Value Range Status   Specimen Description BLOOD LEFT HAND   Final   Special Requests BOTTLES DRAWN AEROBIC AND ANAEROBIC 10CC   Final   Culture  Setup Time     Final   Value: 08/05/2013 01:14     Performed at [facility]   Culture     Final   Value:        BLOOD CULTURE RECEIVED NO GROWTH TO DATE CULTURE WILL BE HELD FOR 5 DAYS BEFORE ISSUING A FINAL NEGATIVE REPORT     Performed at [facility]   Report Status PENDING   Incomplete  URINE CULTURE     Status: None   Collection Time    08/04/13 10:19 PM      Result Value Range Status   Specimen Description URINE, CATHETERIZED   Final   Special Requests fever   Final   Culture  Setup Time     Final   Value: 08/05/2013 11:23     Performed at [facility]     Final   Value: NO GROWTH     Performed at [facility]   Culture     Final   Value: NO GROWTH     Performed at [facility]   Report Status 08/06/2013 FINAL   Final    Studies/Results: No results found.   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  - currently on day 10 of 42 days of IV antibiotics. Recommend to continue with vancomycin (goal trough of 15-20), rifampin 300mg  BID, and ceftriaxone 2gm IV daily. - he will need picc line for prolonged IV antibiotic - with picc line to get weekly dressing change, weekly cbc , cmp and vanco trough. If vanco dose changes, please check new trough prior to 4th dose .  - we will see back in ID clinic in 4-5 wks, to decide oral regimen.  Patient to go to IP rehab. Will sign off. Available to see him in rehab if needed            Judyann Munson Infectious Diseases (pager) [phone] [email or web address] 08/08/2013, 4:14 PM  LOS: 10 days

## 2013-08-08 NOTE — Progress Notes (Addendum)
Steri strips mechanically removed during transport to chair, small amount of bright red blood from top of incision.  Also, there is a hard elevated area at the top of the incision.   Also, pt has fecal incontinence, pt cannot tell when he is having a bowel movement.  Will address with MD during rounds.

## 2013-08-08 NOTE — Progress Notes (Signed)
I have contacted Dr. Jeral FruitBotero once again to request d/c to inpt rehab for today. [phone]

## 2013-08-08 NOTE — H&P (Signed)
Physical Medicine and Rehabilitation Admission H&P    Chief Complaint  Patient presents with  . Cauda equina syndrome with postoperative infection requiring incision and drainage    HPI: [name] is a 73 y.o. right-handed male well known to rehabilitation services from recent stay (11/26--07/08/2013) for  lumbar L4-S1 decompression complicated by CSF leak and cauda equina syndrome. He was readmitted on 07/30/2013 with progressive low back pain  X 3-4 days. MRI of the lumbar spine showed epidural fluid collection and some ring-enhancing fluid collections in the soft tissues. He was placed on broad-spectrum antibiotics and ID consulted for input. They recommended fluid cultures as well as continuing broad spectrum antibiotics. Patient underwent exploration of lumbar wound with removal of fibrosis and decompression of the thecal sac and the right side as well as lumbar L3 and L5 nerve root 08/03/2013 per Dr. Joya Salm.   Blood cultures, wound cultures negative but patient received antibiotics at home prior to admission and ID recommends continuing antibiotics--final recommendations pending. Patient with LLE pain with edema as well as pain/difficulty with weight bearing. LLE dopplers negative for DVT.  Ortho was consulted due to left shoulder pain.  Shoulder aspiration and patient treated with steroids for L-RTC tear with exacerbated symptoms.  He continues with bowel incontinence due to cauda equina syndrome as well as drainage from incision. CIR recommended by therapy team and patient admitted today.   Review of Systems  HENT: Negative for hearing loss.   Eyes: Negative for blurred vision and double vision.  Respiratory: Negative for cough and shortness of breath.   Cardiovascular: Negative for chest pain and palpitations.  Gastrointestinal: Positive for diarrhea. Negative for heartburn and nausea.       Anorexia  Musculoskeletal: Positive for back pain and myalgias.  Neurological: Positive  for sensory change (Continues with saddle numbness. ) and focal weakness. Negative for headaches.  Psychiatric/Behavioral: The patient is nervous/anxious.     Past Medical History  Diagnosis Date  . Hyperlipidemia   . Small bowel problem     HAD ALOT OF SCAR TISSUE FROM PREVIOUS SURGERIES..NG WAS INSERTED ...Marland KitchenMarland KitchenNO SURGERY NEEDED...Marland KitchenMarland KitchenIN FOR 8 DAYS  . Disorder of blood     BEEN TREATED BY DERMATOLOGIST X 4 YRS..."BLOOD BLISTERS"  . Arthritis   . Gout   . Anxiety   . Hx MRSA infection     rt shoulder  . Pneumonia     "I've had it 3-4 times"  . Coronary artery disease     IN 2000   STENT PLACED IN 2012 sees Dr. Lattie Haw, saw last 2013  . HTN (hypertension)     sees Dr. [name] in [place]  . Small bowel obstruction   . Ventral hernia   . Acute renal failure 04/17/2013  . 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 Sternal surg  2000    HAS HAD 5-6 ON HIS STERNUM; "caught MRSA in it"  . Lumbar laminectomy/decompression microdiscectomy  06/13/2011    Procedure: LUMBAR LAMINECTOMY/DECOMPRESSION MICRODISCECTOMY;  Surgeon: Floyce Stakes;  Location: [place] [facility];  Service: Neurosurgery;  Laterality: N/A;  Lumbar three, lumbar four-five Laminectomy  . Eye surgery      bilateral cataract  . Anterior cervical corpectomy  12/17/11  . Incision and drainage of wound  ~ 20ll; 12/03/11    "had infection in my right"  . Shoulder open rotator cuff repair  ~ 2011    right  .  Peripherally inserted central catheter insertion  2011 & 11/2011  . Cataract extraction w/ intraocular lens  implant, bilateral  ? 2011  . Coronary artery bypass graft  2000    CABG X5  . Back surgery      lumbar  . Tonsillectomy  1949  . Cholecystectomy  2006 "or after"  . Coronary angioplasty with stent placement  2012  . Anterior cervical corpectomy  12/17/2011    Procedure: ANTERIOR CERVICAL CORPECTOMY;  Surgeon: Floyce Stakes, MD;  Location: [place]  [facility];  Service: Neurosurgery;  Laterality: N/A;  Anterior Cervical Decompression Fusion Five to Thoracic Two with plating  . Lumbar laminectomy/decompression microdiscectomy Right 06/17/2013    Procedure: LUMBAR LAMINECTOMY/DECOMPRESSION MICRODISCECTOMY 1 LEVEL;  Surgeon: Floyce Stakes, MD;  Location: [place] [facility];  Service: Neurosurgery;  Laterality: Right;  Right L3-4 Microdiskectomy  . Lumbar wound debridement N/A 08/02/2013    Procedure: INCISION AND DRAINAGE OF LUMBAR WOUND DEBRIDEMENT;  Surgeon: Floyce Stakes, MD;  Location: [place] [facility];  Service: Neurosurgery;  Laterality: N/A;   Family History  Problem Relation Age of Onset  . Heart disease    . Hypertension Mother   . Hypertension Maternal Aunt   . Hypertension Maternal Uncle   . Anesthesia problems Neg Hx   . Hypotension Neg Hx   . Malignant hyperthermia Neg Hx   . Pseudochol deficiency Neg Hx    Social History: Married. 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 He reports that he quit smoking about 14 years ago. His smoking use included Cigarettes. He has a 1 pack-year smoking history. He has never used smokeless tobacco. He reports that he does not drink alcohol or use illicit drugs.    Allergies  Allergen Reactions  . Morphine Other (See Comments)    REACTION: made him go crazy; "I had fun w/it; my family didn't think it was too funny"  . Metoprolol Other (See Comments)    REACTION:  Tachycardia; "don't remember how bad it was; it was so long ago"    Medications Prior to Admission  Medication Sig Dispense Refill  . atorvastatin (LIPITOR) 40 MG tablet Take 1 tablet (40 mg total) by mouth daily.  90 tablet  2  . bethanechol (URECHOLINE) 50 MG tablet Take 1 tablet (50 mg total) by mouth 4 (four) times daily.  120 tablet  1  . citalopram (CELEXA) 40 MG tablet Take 40 mg by mouth daily.      . Cyanocobalamin (VITAMIN B 12 PO) Take 1 tablet by mouth daily.      Marland Kitchen doxazosin (CARDURA) 4 MG tablet Take 8 mg by mouth at  bedtime.      . gabapentin (NEURONTIN) 300 MG capsule Take 1 capsule (300 mg total) by mouth 3 (three) times daily.  90 capsule  1  . oxyCODONE (ROXICODONE) 15 MG immediate release tablet Take 7.5 mg by mouth every 4 (four) hours as needed. For pain      . predniSONE (DELTASONE) 5 MG tablet Take 5 mg by mouth daily.      . tamsulosin (FLOMAX) 0.4 MG CAPS capsule Take 1 capsule (0.4 mg total) by mouth daily after breakfast.  30 capsule  1  . vitamin C (ASCORBIC ACID) 500 MG tablet Take 500 mg by mouth daily.      Marland Kitchen VITAMIN D, CHOLECALCIFEROL, PO Take 2-3 tablets by mouth daily.      Marland Kitchen VITAMIN E PO Take 2-3 tablets by mouth daily.      Marland Kitchen  nitroGLYCERIN (NITROSTAT) 0.4 MG SL tablet Place 0.4 mg under the tongue every 5 (five) minutes as needed. For chest pain        Home: Home Living Family/patient expects to be discharged to:: Private residence Living Arrangements: Spouse/significant other Available Help at Discharge: Family;Available 24 hours/day Type of Home: House Home Access: Stairs to enter [employer] of Steps: 1 from back entrance; 3-4 from front entrance Entrance Stairs-Rails: None Home Layout: One level Home Equipment: Walker - 2 wheels;Cane - single point;Bedside commode;Grab bars - tub/shower;Wheelchair - Scientist, physiological: Reacher Additional Comments: wife has a Secondary school teacher  Lives With: Spouse   Functional History: Prior Function Comments: prior to first back sx in November 2014  Functional Status:  Mobility:     Ambulation/Gait Ambulation Distance (Feet): 3 Feet Gait velocity: decreased General Gait Details: not assessed secondary to pain, decreased activity tolerance.    ADL: ADL Eating/Feeding: Supervision/safety Where Assessed - Eating/Feeding: Edge of bed Grooming: Wash/dry face;Teeth care;Set up;Supervision/safety Where Assessed - Grooming:  (sitting EOB) Lower Body Bathing: +1 Total assistance Where Assessed - Lower Body  Bathing: Supine, head of bed up;Supine, head of bed flat;Rolling right and/or left Lower Body Dressing: +1 Total assistance Where Assessed - Lower Body Dressing: Supine, head of bed up;Supine, head of bed flat;Rolling right and/or left Tub/Shower Transfer Method: Not assessed Transfers/Ambulation Related to ADLs: Not assessed ADL Comments: Pt. ed. on use of AE for LE and UE ADL's and back sheet given. Pt. declined to return demo secondary to nuasea.   Cognition: Cognition Overall Cognitive Status: Within Functional Limits for tasks assessed Orientation Level: Oriented X4 Cognition Arousal/Alertness: Awake/alert Behavior During Therapy: WFL for tasks assessed/performed Overall Cognitive Status: Within Functional Limits for tasks assessed  Physical Exam: Blood pressure 131/62, pulse 80, temperature 97.8 F (36.6 C), temperature source Oral, resp. rate 18, height 6' (1.829 m), weight 81.602 kg (179 lb 14.4 oz), SpO2 99.00%. Physical Exam  Nursing note and vitals reviewed. Constitutional: He is oriented to person, place, and time. He appears well-developed and well-nourished.  HENT:  Head: Normocephalic and atraumatic.  Right Ear: External ear normal.  Left Ear: External ear normal.  Eyes: Conjunctivae are normal. Pupils are equal, round, and reactive to light.  Neck: Normal range of motion. Neck supple. No JVD present. No tracheal deviation present. No thyromegaly present.  Cardiovascular: Normal rate, regular rhythm and normal heart sounds.   Respiratory: Effort normal and breath sounds normal. No respiratory distress. He has no wheezes. He has no rales.  GI: Soft. Bowel sounds are normal. He exhibits no distension. There is no tenderness. There is no rebound.  Abdominal wall weakness due to prior surgery.   Musculoskeletal: He exhibits tenderness (BLE shin/ankle with areas of dysesthesias. ).  Right forearm with reddened and edematous area--prior site of IV infiltration?  Bilateral  feet with 2- + pitting edema. Left foot drop.    Lymphadenopathy:    He has no cervical adenopathy.  Neurological: He is alert and oriented to person, place, and time.  4+/5 strength bilateral UE's proximal to distal. Normal sensation in bilateral upper extremities Absent sensation left foot but intact above the ankle Left lower extremity strength 2+ to 3 minus hip flexion, 3+ knee extension 1/5 ankle plantarflexion 0/5 ankle dorsiflexion Near normal sensation right foot and right lower extremity Right lower extremity strength is 3/5 hip flexion knee extension, 2+ to 3 minus ankle dorsiflexion and plantarflexion      Skin: Skin is warm.  Left  buttock with chapped area and intergluteal irritation noted. Perineum abraded, peeling reddened skin under a layer of cream. Lower back incision open to air--clean, dry and intact at present time.   Psychiatric: His speech is normal. Judgment and thought content normal. His mood appears anxious. He is withdrawn. Cognition and memory are normal.    Results for orders placed during the hospital encounter of 07/29/13 (from the past 48 hour(s))  CBC WITH DIFFERENTIAL     Status: Abnormal   Collection Time    08/08/13 11:55 AM      Result Value Range   WBC 6.4  4.0 - 10.5 K/uL   RBC 3.41 (*) 4.22 - 5.81 MIL/uL   Hemoglobin 10.1 (*) 13.0 - 17.0 g/dL   HCT 29.7 (*) 39.0 - 52.0 %   MCV 87.1  78.0 - 100.0 fL   MCH 29.6  26.0 - 34.0 pg   MCHC 34.0  30.0 - 36.0 g/dL   RDW 14.4  11.5 - 15.5 %   Platelets 462 (*) 150 - 400 K/uL   Neutrophils Relative % 65  43 - 77 %   Neutro Abs 4.1  1.7 - 7.7 K/uL   Lymphocytes Relative 13  12 - 46 %   Lymphs Abs 0.9  0.7 - 4.0 K/uL   Monocytes Relative 10  3 - 12 %   Monocytes Absolute 0.7  0.1 - 1.0 K/uL   Eosinophils Relative 12 (*) 0 - 5 %   Eosinophils Absolute 0.8 (*) 0.0 - 0.7 K/uL   Basophils Relative 0  0 - 1 %   Basophils Absolute 0.0  0.0 - 0.1 K/uL  BASIC METABOLIC PANEL     Status: Abnormal    Collection Time    08/08/13 11:55 AM      Result Value Range   Sodium 138  137 - 147 mEq/L   Potassium 4.5  3.7 - 5.3 mEq/L   Chloride 101  96 - 112 mEq/L   CO2 27  19 - 32 mEq/L   Glucose, Bld 141 (*) 70 - 99 mg/dL   BUN 10  6 - 23 mg/dL   Creatinine, Ser 1.02  0.50 - 1.35 mg/dL   Calcium 8.9  8.4 - 10.5 mg/dL   GFR calc non Af Amer 72 (*) >90 mL/min   GFR calc Af Amer 84 (*) >90 mL/min   Comment: (NOTE)     The eGFR has been calculated using the CKD EPI equation.     This calculation has not been validated in all clinical situations.     eGFR's persistently <90 mL/min signify possible Chronic Kidney     Disease.   No results found.  Post Admission Physician Evaluation: 1. Functional deficits secondary  to cauda equina syndrome s/p decompression with subsequent wound infection, operative site fluid collection/scarring and surgical re-exploration and decompression. 2. Patient is admitted to receive collaborative, interdisciplinary care between the physiatrist, rehab nursing staff, and therapy team. 3. Patient's level of medical complexity and substantial therapy needs in context of that medical necessity cannot be provided at a lesser intensity of care such as a SNF. 4. Patient has experienced substantial functional loss from his/her baseline which was documented above under the "Functional History" and "Functional Status" headings.  Judging by the patient's diagnosis, physical exam, and functional history, the patient has potential for functional progress which will result in measurable gains while on inpatient rehab.  These gains will be of substantial and practical use upon discharge  in  facilitating mobility and self-care at the household level. 5. Physiatrist will provide 24 hour management of medical needs as well as oversight of the therapy plan/treatment and provide guidance as appropriate regarding the interaction of the two. 6. 24 hour rehab nursing will assist with bladder  management, bowel management, safety, skin/wound care, disease management, medication administration, pain management and patient education  and help integrate therapy concepts, techniques,education, etc. 7. PT will assess and treat for/with: Lower extremity strength, range of motion, stamina, balance, functional mobility, safety, adaptive techniques and equipment, NMR, pain mgt, back precautions.   Goals are: min to mod assist . 8. OT will assess and treat for/with: ADL's, functional mobility, safety, upper extremity strength, adaptive techniques and equipmen, NMR, pain mgt, back precautions.   Goals are: min to mod assist. 9. SLP will assess and treat for/with: n/a.  Goals are: n/a. 10. Case Management and Social Worker will assess and treat for psychological issues and discharge planning. 11. Team conference will be held weekly to assess progress toward goals and to determine barriers to discharge. 12. Patient will receive at least 3 hours of therapy per day at least 5 days per week. 13. ELOS: 15-23 days (depending upon acute neurological recovery)       14. Prognosis:  good   Medical Problem List and Plan: Cauda equina syndrome post lumbar decompression with wound infection.  1. DVT Prophylaxis/Anticoagulation: Pharmaceutical: Lovenox 2. Pain Management:  Continue neurontin for dysesthesias--may need additional medication to help with symptoms.  3. Mood: Team to provide ego support for elevated levels of anxiety. :CSW to follow for evaluation and support.  4. Neuropsych: This patient is capable of making decisions on his own behalf. 5. Neurogenic bladder:  Continue Flomax, Cardura and urecholine. Continue foley for now. Will treat yeast infection with short course of diflucan.  6. Neurogenic bowel:  Will re-establish bowel program. Suppository daily in am. Check KUB.  7. Anemia:  Will recheck in am.  8. Skin: Candidal irritation perineal area as well as MAST left buttock  due to bowel  incontinence compounded by yeast  Infection due to antibiotics. Keep area open to air,  air mattress overlay.Diflucan x 5 days. Will check KUB to decide on need for aggressive bowel program.  Anticipate improvement once incontinence resolves.   Meredith Staggers, MD, [facility]   08/08/2013

## 2013-08-08 NOTE — Progress Notes (Signed)
Pt was admitted to room 4W17 from 4N. Admission vital sign are stable.

## 2013-08-08 NOTE — Progress Notes (Signed)
Occupational Therapy Treatment Patient Details Name: [name] MRN: [id number] DOB: 01/23/1941 Today's Date: 08/08/2013 Time: [id number] OT Time Calculation (min): 23 min  OT Assessment / Plan / Recommendation  History of present illness Pt is a 73 yo male whoe had initial spinal surgery in November 2014, who presents now s/p INCISION AND DRAINAGE OF LUMBAR WOUND DEBRIDEMENT. (Simultaneous filing. User may not have seen previous data.)   OT comments  Pt. Was limited by nausea today and ed. Was performed but pt. Not able to return demo. Pt. Plans to d/c to CIR.   Follow Up Recommendations       Barriers to Discharge       Equipment Recommendations       Recommendations for Other Services    Frequency     Progress towards OT Goals Progress towards OT goals: Progressing toward goals  Plan      Precautions / Restrictions Precautions Precautions: Back;Fall Precaution Booklet Issued: No Precaution Comments: Reviewed back precautions with pt. Required Braces or Orthoses:  (has MAFO for L ankle) Restrictions Weight Bearing Restrictions: No   Pertinent Vitals/Pain No c/o    ADL  ADL Comments: Pt. ed. on use of AE for LE and UE ADL's and back sheet given. Pt. declined to return demo secondary to nuasea.     OT Diagnosis:    OT Problem List:   OT Treatment Interventions:     OT Goals(current goals can now be found in the care plan section) Acute Rehab OT Goals Patient Stated Goal: not stated  Visit Information  Last OT Received On: 08/08/13 Assistance Needed: +2 OT goals addressed during session: Proper use of Adaptive equipment and DME History of Present Illness: Pt is a 73 yo male whoe had initial spinal surgery in November 2014, who presents now s/p INCISION AND DRAINAGE OF LUMBAR WOUND DEBRIDEMENT. (Simultaneous filing. User may not have seen previous data.)    Subjective Data      Prior Functioning       Cognition  Cognition Arousal/Alertness:  Awake/alert Behavior During Therapy: WFL for tasks assessed/performed Overall Cognitive Status: Within Functional Limits for tasks assessed    Mobility  Bed Mobility Overal bed mobility: Needs Assistance Bed Mobility: Rolling;Sidelying to Sit;Sit to Sidelying Rolling: Min assist Sidelying to sit: Mod assist Sit to sidelying: Mod assist General bed mobility comments: assist for trunk elevation and control of LLE Transfers Overall transfer level: Needs assistance Equipment used: Rolling walker (2 wheeled) Transfers: Sit to/from BJ'sStand;Stand Pivot Transfers Sit to Stand: +2 physical assistance Stand pivot transfers: +2 physical assistance General transfer comment: VCs for increased use of RLE, assist to maintain upright,     Exercises  General Exercises - Lower Extremity Ankle Circles/Pumps: AROM;AAROM;Both;10 reps Long Arc Quad: AROM;AAROM;Both;10 reps Hip Flexion/Marching: AROM;AAROM;Both;10 reps   Balance Balance Overall balance assessment: Needs assistance Sitting-balance support: Feet supported Sitting balance-Leahy Scale: Good Standing balance support: Bilateral upper extremity supported;During functional activity Standing balance-Leahy Scale: Poor Standing balance comment: tolerated standing for approximately 30 seconds today with assist for stability, performed x 2 for hygiene purposes General Comments General comments (skin integrity, edema, etc.): while sitting upright EOB there was a visible fluid collection at the superior region of incision, nursing notified, additionally, patient with some erythema and itchiness around incision. patient continues to have bowel dysfunction (unable to recognize when having BM) hygiene performed x2 during session. Patient educated on positioning for LLE and use of brace to preserve ankle ROM.  End of Session OT -  End of Session Activity Tolerance:  (treatment limited secondary to nausea.) Patient left: in chair;with call bell/phone within  reach  GO     [name] 08/08/2013, 12:34 PM

## 2013-08-08 NOTE — Discharge Summary (Signed)
Physician Discharge Summary  Patient ID: Jake MarvelRonald W Haff MRN: [id number] DOB/AGE: 71/09/1940 71 y.o.  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 Weakness left foot  Hospital Course:reomal of right epidural cyst  Consults: ID  rehabilitatiuon medicine  Significant Diagnostic Studies:mri  Treatments: surgery  Discharge Exam: Blood pressure 120/52, pulse 69, temperature 98.9 F (37.2 C), temperature source Oral, resp. rate 18, height 6' (1.829 m), weight 81.602 kg (179 lb 14.4 oz), SpO2 97.00%. Weakness left foot. Bladder incontinency   Dispositionto rehab medicine. i will continue to follow hoim while in the hospital   Future Appointments Provider Department Dept Phone   08/09/2013 7:30 AM Coralyn MarkPatricia M Clay, OT [facility] [phone]   08/09/2013 9:30 AM Philip AspenAlison B Gray, PT [facility] [phone]   08/09/2013 10:45 AM Coralyn MarkPatricia M Clay, OT [facility] [phone] [facility] [phone]   08/09/2013 1:00 PM Philip AspenAlison B Gray, PT [facility] [phone]   08/15/2013 11:40 AM Ranelle OysterZachary T Swartz, MD [facility] [phone]   11/03/2013 8:30 AM Vickki HearingStanley E Harrison, MD [facility] [phone]       Medication List         atorvastatin 40 MG tablet  Commonly known as:  LIPITOR  Take 1 tablet (40 mg total) by mouth daily.     bethanechol 50 MG tablet  Commonly known as:  URECHOLINE  Take 1 tablet (50 mg total) by mouth 4 (four) times daily.     citalopram 40 MG tablet  Commonly known as:  CELEXA  Take 40 mg by mouth daily.     doxazosin 4 MG tablet  Commonly known as:  CARDURA  Take 8 mg by mouth at  bedtime.     gabapentin 300 MG capsule  Commonly known as:  NEURONTIN  Take 1 capsule (300 mg total) by mouth 3 (three) times daily.     nitroGLYCERIN 0.4 MG SL tablet  Commonly known as:  NITROSTAT  Place 0.4 mg under the tongue every 5 (five) minutes as needed. For chest pain     oxyCODONE 15 MG immediate release tablet  Commonly known as:  ROXICODONE  Take 7.5 mg by mouth every 4 (four) hours as needed. For pain     predniSONE 5 MG tablet  Commonly known as:  DELTASONE  Take 5 mg by mouth daily.     tamsulosin 0.4 MG Caps capsule  Commonly known as:  FLOMAX  Take 1 capsule (0.4 mg total) by mouth daily after breakfast.     VITAMIN B 12 PO  Take 1 tablet by mouth daily.     vitamin C 500 MG tablet  Commonly known as:  ASCORBIC ACID  Take 500 mg by mouth daily.     VITAMIN D (CHOLECALCIFEROL) PO  Take 2-3 tablets by mouth daily.     VITAMIN E PO  Take 2-3 tablets by mouth daily.         Signed: Karn CassisBOTERO,Iliany Losier M 08/08/2013, 5:13 PM

## 2013-08-08 NOTE — Progress Notes (Signed)
Patient functionally ready for inpt rehab admission today and is in agreement. I will contact Dr. Jeral FruitBotero and arrange for today. RNCM is aware. [phone]

## 2013-08-08 NOTE — PMR Pre-admission (Signed)
PMR Admission Coordinator Pre-Admission Assessment  Patient: [name] is an 73 y.o., male MRN: [id number] DOB: Feb 09, 1941 Height: 6' (182.9 cm) Weight: 81.602 kg (179 lb 14.4 oz)              Insurance Information HMO:     PPO:      PCP:      IPA:      80/20: yes     OTHER: no HMO PRIMARY: Medicare a and b      Policy#: [id number]      Subscriber: pt Benefits:  Phone #: online      Name: 08/08/13 Eff. Date: 12/27/07     Deduct: $1250      Out of Pocket Max: none      Life Max: none CIR: 100%      SNF: 20 full days Outpatient: 80%     Co-Pay: 20% Home Health: 100%      Co-Pay: none DME: 80%     Co-Pay: 20% Providers: pt choice  SECONDARY: [employer]      Policy#: [id number]      Subscriber: pt  Medicaid Application Date:       Case Manager:  Disability Application Date:       Case Worker:   Emergency Contact Information Contact Information   Name Relation Home Work Mobile   Fedewa,Peggy Spouse [phone]     Kamen, Hanken [phone]  [phone]   Anay, Rathe    [phone]     Current Medical History  Patient Admitting Diagnosis: Cauda equina syndrome with postoperative infection requiring incision and drainage  History of Present Illness: NOBUO [name] is a 73 y.o. right-handed male well known to rehabilitation services recently admitted inpatient rehabilitation services 06/22/2013 until 07/08/2013 cauda equina syndrome and had undergone lumbar L4-S1 decompression complicated by CSF leak and some urinary retention. He was discharged to home ambulating 40 feet with minimal assistance using a walker.  Presented 07/30/2013 with progressive low back pain over the last 3-4 days. MRI of the lumbar spine showed epidural fluid collection and some ring-enhancing fluid collections in the soft tissues. He was placed on broad-spectrum antibiotics and received followed by infectious disease and a questionable abscess. Patient underwent exploration of lumbar wound with  removal of fibrosis and decompression of the thecal sac and the right side as well as lumbar L3 and L5 nerve root 08/03/2013 per Dr. Jeral Fruit. Postoperative pain management.  Presently maintained on Rocephin and vancomycin. Latest wound culture shows no growth. Patient complains of hypersensitivity and inability to bear weight on left foot since surgery yesterday  Patient has a Foley.  Past Medical History  Past Medical History  Diagnosis Date  . Hyperlipidemia   . Small bowel problem     HAD ALOT OF SCAR TISSUE FROM PREVIOUS SURGERIES..NG WAS INSERTED ...Marland KitchenMarland KitchenNO SURGERY NEEDED...Marland KitchenMarland KitchenIN FOR 8 DAYS  . Disorder of blood     BEEN TREATED BY DERMATOLOGIST X 4 YRS..."BLOOD BLISTERS"  . Arthritis   . Gout   . Anxiety   . Hx MRSA infection     rt shoulder  . Pneumonia     "I've had it 3-4 times"  . Coronary artery disease     IN 2000   STENT PLACED IN 2012 sees Dr. Dietrich Pates, saw last 2013  . HTN (hypertension)     sees [name] in [place]  . Small bowel obstruction   . Ventral hernia   . Acute renal failure 04/17/2013  . 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 There is no history of Anesthesia problems, Hypotension, Malignant hyperthermia, or Pseudochol deficiency.  Prior Rehab/Hospitalizations: CIR 11/14 then home  Current Medications  Current facility-administered medications:0.9 %  sodium chloride infusion, 250 mL, Intravenous, Continuous, Karn CassisErnesto M Botero, MD;  0.9 %  sodium chloride infusion, , Intravenous, Continuous, Karn CassisErnesto M Botero, MD, Last Rate: 75 mL/hr at 08/02/13 2000;  0.9 % NaCl with KCl 20 mEq/ L  infusion, , Intravenous, Continuous, Tia Alertavid S Jones, MD, Last Rate: 75 mL/hr at 08/06/13 2025 acetaminophen (TYLENOL) suppository 650 mg, 650 mg, Rectal, Q4H PRN, Karn CassisErnesto M Botero, MD;  acetaminophen (TYLENOL) tablet 650  mg, 650 mg, Oral, Q4H PRN, Karn CassisErnesto M Botero, MD, 650 mg at 08/04/13 0853;  bethanechol (URECHOLINE) tablet 50 mg, 50 mg, Oral, QID, Tia Alertavid S Jones, MD, 50 mg at 08/08/13 1415;  bupivacaine (PF) (MARCAINE) 0.25 % injection 5 mL, 5 mL, Infiltration, Once, Karn CassisErnesto M Botero, MD bupivacaine liposome (EXPAREL) 1.3 % injection 266 mg, 20 mL, Infiltration, Once, Karn CassisErnesto M Botero, MD;  cefTRIAXone (ROCEPHIN) 2 g in dextrose 5 % 50 mL IVPB, 2 g, Intravenous, Q24H, Karn CassisErnesto M Botero, MD, 2 g at 08/07/13 2113;  citalopram (CELEXA) tablet 40 mg, 40 mg, Oral, Daily, Tia Alertavid S Jones, MD, 40 mg at 08/08/13 1006;  diazepam (VALIUM) tablet 5 mg, 5 mg, Oral, Q6H PRN, Karn CassisErnesto M Botero, MD, 5 mg at 08/08/13 0640 diphenhydrAMINE (BENADRYL) 12.5 MG/5ML elixir 12.5 mg, 12.5 mg, Oral, Q6H PRN, Tia Alertavid S Jones, MD;  diphenhydrAMINE (BENADRYL) injection 12.5 mg, 12.5 mg, Intravenous, Q6H PRN, Tia Alertavid S Jones, MD, 12.5 mg at 08/08/13 1005;  doxazosin (CARDURA) tablet 8 mg, 8 mg, Oral, QHS, Tia Alertavid S Jones, MD, 8 mg at 08/07/13 2112;  gabapentin (NEURONTIN) capsule 600 mg, 600 mg, Oral, TID, Judyann Munsonynthia Snider, MD, 600 mg at 08/08/13 1005 guaifenesin (ROBITUSSIN) 100 MG/5ML syrup 200 mg, 200 mg, Oral, Q4H PRN, Mariam DollarGary P Cram, MD, 200 mg at 08/04/13 0558;  heparin injection 5,000 Units, 5,000 Units, Subcutaneous, Q8H, Karn CassisErnesto M Botero, MD, 5,000 Units at 08/08/13 1415;  HYDROmorphone (DILAUDID) tablet 4 mg, 4 mg, Oral, Q4H PRN, Karn CassisErnesto M Botero, MD, 4 mg at 08/08/13 1031;  menthol-cetylpyridinium (CEPACOL) lozenge 3 mg, 1 lozenge, Oral, PRN, Karn CassisErnesto M Botero, MD methocarbamol (ROBAXIN) 1,000 mg in dextrose 5 % 50 mL IVPB, 1,000 mg, Intravenous, Q6H PRN, Mariam DollarGary P Cram, MD;  methylPREDNISolone acetate (DEPO-MEDROL) injection 40 mg, 40 mg, Intra-articular, Once, [employer] and Annuity AssociationJoshua [name], PA-C;  naloxone [facility](NARCAN) injection 0.4 mg, 0.4 mg, Intravenous, PRN, Tia Alertavid S Jones, MD;  nitroGLYCERIN (NITROSTAT) SL tablet 0.4 mg, 0.4 mg, Sublingual, Q5 min PRN, Tia Alertavid S Jones,  MD ondansetron [facility](ZOFRAN) injection 4 mg, 4 mg, Intravenous, Q6H PRN, Tia Alertavid S Jones, MD, 4 mg at 08/02/13 1236;  ondansetron [facility](ZOFRAN) injection 4 mg, 4 mg, Intravenous, Q4H PRN, Karn CassisErnesto M Botero, MD, 4 mg at 08/08/13 1209;  oxyCODONE (Oxy IR/ROXICODONE) immediate release tablet 10 mg, 10 mg, Oral, Q4H PRN, Tia Alertavid S Jones, MD, 10 mg at 08/08/13 1237 oxyCODONE-acetaminophen (PERCOCET/ROXICET) 5-325 MG per tablet 1-2 tablet, 1-2 tablet, Oral, Q4H PRN, Karn CassisErnesto M Botero, MD, 2 tablet at 08/02/13 2159;  phenol (CHLORASEPTIC) mouth spray 1 spray, 1 spray, Mouth/Throat, PRN, Karn CassisErnesto M Botero, MD, 1 spray at 08/04/13 1749;  rifampin (RIFADIN) capsule 300 mg, 300 mg, Oral, Q12H, Tia Alertavid S Jones, MD, 300  mg at 08/08/13 1006 sodium chloride 0.9 % injection 3 mL, 3 mL, Intravenous, Q12H, Karn Cassis, MD, 3 mL at 08/08/13 1006;  sodium chloride 0.9 % injection 3 mL, 3 mL, Intravenous, PRN, Karn Cassis, MD, 3 mL at 08/06/13 2143;  sodium chloride 0.9 % injection 9 mL, 9 mL, Intravenous, PRN, Tia Alert, MD;  sodium phosphate (FLEET) 7-19 GM/118ML enema 1 enema, 1 enema, Rectal, Daily PRN, Karn Cassis, MD tamsulosin [facility]) capsule 0.4 mg, 0.4 mg, Oral, QPC breakfast, Tia Alert, MD, 0.4 mg at 08/08/13 7829;  vancomycin (VANCOCIN) IVPB 1000 mg/200 mL premix, 1,000 mg, Intravenous, Q12H, Karn Cassis, MD, 1,000 mg at 08/08/13 1006;  vitamin C (ASCORBIC ACID) tablet 500 mg, 500 mg, Oral, Daily, Tia Alert, MD, 500 mg at 08/08/13 1006;  zolpidem (AMBIEN) tablet 5 mg, 5 mg, Oral, QHS PRN, Karn Cassis, MD  Patients Current Diet: General  Precautions / Restrictions Precautions Precautions: Back;Fall Precaution Booklet Issued: No Precaution Comments: Reviewed back precautions with pt. Restrictions Weight Bearing Restrictions: No   Prior Activity Level Limited Community (1-2x/wk): limited since 11/14 admission for surgery  Home Assistive Devices / Equipment Home Assistive Devices/Equipment:  Wheelchair Home Equipment: Environmental consultant - 2 wheels;Cane - single point;Bedside commode;Grab bars - tub/shower;Wheelchair - Building surveyor  Prior Functional Level Prior Function Level of Independence: Independent Comments: prior to first back sx in November 2014  Current Functional Level Cognition  Overall Cognitive Status: Within Functional Limits for tasks assessed Orientation Level: Oriented X4    Extremity Assessment (includes Sensation/Coordination)          ADLs  Eating/Feeding: Supervision/safety Where Assessed - Eating/Feeding: Edge of bed Grooming: Wash/dry face;Teeth care;Set up;Supervision/safety Where Assessed - Grooming:  (sitting EOB) Lower Body Bathing: +1 Total assistance Where Assessed - Lower Body Bathing: Supine, head of bed up;Supine, head of bed flat;Rolling right and/or left Lower Body Dressing: +1 Total assistance Where Assessed - Lower Body Dressing: Supine, head of bed up;Supine, head of bed flat;Rolling right and/or left Tub/Shower Transfer Method: Not assessed Transfers/Ambulation Related to ADLs: Not assessed ADL Comments: Pt. ed. on use of AE for LE and UE ADL's and back sheet given. Pt. declined to return demo secondary to nuasea.     Mobility       Transfers       Ambulation / Gait / Stairs / Wheelchair Mobility  Ambulation/Gait Ambulation Distance (Feet): 3 Feet Gait velocity: decreased General Gait Details: not assessed secondary to pain, decreased activity tolerance.    Posture / Balance Dynamic Sitting Balance Dynamic Sitting - Comments: increased pain could not tolerate extended time in seated position    Special needs/care consideration Bowel mgmt: incontinent Bladder mgmt: foley Neurogenic bowel and bladder   Previous Home Environment Living Arrangements: Spouse/significant other  Lives With: Spouse Available Help at Discharge: Family;Available 24 hours/day Type of Home: House Home Layout: One level Home Access: Stairs  to enter Entrance Stairs-Rails: None Entrance Stairs-Number of Steps: 1 from back entrance; 3-4 from front entrance Bathroom Shower/Tub: Walk-in shower;Door [employer] Toilet: Standard Bathroom Accessibility: Yes How Accessible: Accessible via walker Home Care Services: No Additional Comments: wife has a reacher  Discharge Living Setting Plans for Discharge Living Setting: Patient's home;Lives with (comment);Other (Comment) (wife) Type of Home at Discharge: House Discharge Home Layout: One level Discharge Home Access: Stairs to enter Entrance Stairs-Rails: Can reach both Entrance Stairs-Number of Steps: 1 Discharge Bathroom Shower/Tub: Walk-in shower;Door Discharge Bathroom Toilet: Standard Discharge Bathroom Accessibility: Yes  How Accessible: Accessible via walker Does the patient have any problems obtaining your medications?: No  Social/Family/Support Systems Patient Roles: Spouse;Parent Contact Information: Tawni Millers, wife Anticipated Caregiver: wife and family Anticipated Caregiver's Contact Information: see above Ability/Limitations of Caregiver: no limitations; pt feels unsafe ambulating with wife pta since d/c from rehab Caregiver Availability: 24/7 Discharge Plan Discussed with Primary Caregiver: Yes Is Caregiver In Agreement with Plan?: No Does Caregiver/Family have Issues with Lodging/Transportation while Pt is in Rehab?: No    Goals/Additional Needs Patient/Family Goal for Rehab: min assist PT and OT; likely w/c level as pta since CIR stay 11/14 Expected length of stay: ELOS 2 to 3 weeks Equipment Needs: bowel and bladder incontinence Special Service Needs: bowel and bladder training Pt/Family Agrees to Admission and willing to participate: Yes Program Orientation Provided & Reviewed with Pt/Caregiver Including Roles  & Responsibilities: Yes   Decrease burden of Care through IP rehab admission: n/a  Possible need for SNF placement upon discharge: not  anticipated  Patient Condition: This patient's medical and functional status has changed since the consult dated: 08/04/13 in which the Rehabilitation Physician determined and documented that the patient's condition is appropriate for intensive rehabilitative care in an inpatient rehabilitation facility. See "History of Present Illness" (above) for medical update. Functional changes are: overall mod to max 3 feet. Patient's medical and functional status update has been discussed with the Rehabilitation physician and patient remains appropriate for inpatient rehabilitation. Will admit to inpatient rehab today.  Preadmission Screen Completed By:  Clois Dupes, 08/08/2013 3:51 PM ______________________________________________________________________   Discussed status with Dr. Riley Kill on 08/08/13 at  1551 and received telephone approval for admission today.  Admission Coordinator:  Clois Dupes, time 1610 Date 08/08/13.

## 2013-08-08 NOTE — Progress Notes (Signed)
ANTIBIOTIC CONSULT NOTE - FOLLOW UP  Pharmacy Consult for Vancomycin and Ceftriaxone Indication: lumbar wound infection/ presumed epidural abscess  Allergies  Allergen Reactions  . Morphine Other (See Comments)    REACTION: made him go crazy; "I had fun w/it; my family didn't think it was too funny"  . Metoprolol Other (See Comments)    REACTION:  Tachycardia; "don't remember how bad it was; it was so long ago"    Patient Measurements: Height: 6' (182.9 cm) Weight: 179 lb 14.4 oz (81.602 kg) IBW/kg (Calculated) : 77.6  Vital Signs: Temp: 98.5 F (36.9 C) (01/12 1900) Temp src: Oral (01/12 1900) BP: 124/63 mmHg (01/12 1900) Pulse Rate: 72 (01/12 1900)  Labs:  Recent Labs  08/06/13 0544 08/08/13 1155  WBC 6.5 6.4  HGB 9.2* 10.1*  PLT 334 462*  CREATININE 0.95 1.02   Estimated Creatinine Clearance: 74 ml/min (by C-G formula based on Cr of 1.02).  Assessment:   73 yr old male, transferred from 4N to Rehab today.  Rx has been assisting with Vancomycin and Ceftriaxone dosing since 07/30/13.  Also on Rifampin 300 mg PO q12hrs. Day # 10 of 42 days of IV antibiotics planned. Last Vancomycin trough level was at goal (16.1 mcg/ml) on 08/03/13.  Goal of Therapy:  Vancomycin trough level 15-20 mcg/ml appropriate Ceftriaxone dose for infection  Plan:   Continue Vancomycin 1 gram IV q12hrs.  Will check Vancomycin trough in am.   Weekly vancomycin trough levels;  bmet every 3-4 days.  Continue Ceftriaxone 2 grams IV q24hrs.  Dennie Fettersgan, Bracken Moffa Donovan, [place] Pager: [phone] 08/08/2013,8:24 PM

## 2013-08-08 NOTE — Progress Notes (Signed)
Discharge orders received, pt for dischargeto CIR today.IV left in place for IV antibiotics,  Incision open to air and Foley catheter still in place., emptied before transfer. Staff brought pt to CIR via bed.

## 2013-08-09 ENCOUNTER — Inpatient Hospital Stay (HOSPITAL_COMMUNITY): Payer: Medicare Other

## 2013-08-09 ENCOUNTER — Inpatient Hospital Stay (HOSPITAL_COMMUNITY): Payer: Medicare Other | Admitting: Occupational Therapy

## 2013-08-09 DIAGNOSIS — K592 Neurogenic bowel, not elsewhere classified: Secondary | ICD-10-CM

## 2013-08-09 DIAGNOSIS — N319 Neuromuscular dysfunction of bladder, unspecified: Secondary | ICD-10-CM

## 2013-08-09 DIAGNOSIS — M4716 Other spondylosis with myelopathy, lumbar region: Secondary | ICD-10-CM

## 2013-08-09 LAB — COMPREHENSIVE METABOLIC PANEL
ALBUMIN: 2.2 g/dL — AB (ref 3.5–5.2)
ALK PHOS: 66 U/L (ref 39–117)
ALT: 10 U/L (ref 0–53)
AST: 12 U/L (ref 0–37)
BUN: 10 mg/dL (ref 6–23)
CALCIUM: 8.5 mg/dL (ref 8.4–10.5)
CO2: 27 mEq/L (ref 19–32)
Chloride: 103 mEq/L (ref 96–112)
Creatinine, Ser: 1.12 mg/dL (ref 0.50–1.35)
GFR calc non Af Amer: 65 mL/min — ABNORMAL LOW (ref >90)
GFR, EST AFRICAN AMERICAN: 75 mL/min — AB (ref >90)
Glucose, Bld: 105 mg/dL — ABNORMAL HIGH (ref 70–99)
POTASSIUM: 4.6 meq/L (ref 3.7–5.3)
SODIUM: 139 meq/L (ref 137–147)
Total Bilirubin: <0.2 mg/dL — ABNORMAL LOW (ref 0.3–1.2)
Total Protein: 5 g/dL — ABNORMAL LOW (ref 6.0–8.3)

## 2013-08-09 LAB — CBC WITH DIFFERENTIAL/PLATELET
BASOS ABS: 0 10*3/uL (ref 0.0–0.1)
Basophils Relative: 0 % (ref 0–1)
EOS ABS: 0.8 10*3/uL — AB (ref 0.0–0.7)
EOS PCT: 12 % — AB (ref 0–5)
HCT: 28.5 % — ABNORMAL LOW (ref 39.0–52.0)
Hemoglobin: 9.4 g/dL — ABNORMAL LOW (ref 13.0–17.0)
Lymphocytes Relative: 12 % (ref 12–46)
Lymphs Abs: 0.8 10*3/uL (ref 0.7–4.0)
MCH: 29.2 pg (ref 26.0–34.0)
MCHC: 33 g/dL (ref 30.0–36.0)
MCV: 88.5 fL (ref 78.0–100.0)
Monocytes Absolute: 0.8 10*3/uL (ref 0.1–1.0)
Monocytes Relative: 12 % (ref 3–12)
NEUTROS PCT: 64 % (ref 43–77)
Neutro Abs: 4.3 10*3/uL (ref 1.7–7.7)
Platelets: 411 10*3/uL — ABNORMAL HIGH (ref 150–400)
RBC: 3.22 MIL/uL — AB (ref 4.22–5.81)
RDW: 14.6 % (ref 11.5–15.5)
WBC: 6.7 10*3/uL (ref 4.0–10.5)

## 2013-08-09 LAB — VANCOMYCIN, TROUGH: Vancomycin Tr: 24 ug/mL — ABNORMAL HIGH (ref 10.0–20.0)

## 2013-08-09 MED ORDER — PREDNISONE (PAK) 10 MG PO TABS: 20.0000 mg | ORAL_TABLET | Freq: Every evening | ORAL | Status: DC

## 2013-08-09 MED ORDER — PREDNISONE (PAK) 10 MG PO TABS
20.0000 mg | ORAL_TABLET | Freq: Every morning | ORAL | Status: AC
  Administered 2013-08-09: 20 mg via ORAL
  Filled 2013-08-09: qty 21

## 2013-08-09 MED ORDER — PREDNISONE (PAK) 10 MG PO TABS
10.0000 mg | ORAL_TABLET | Freq: Four times a day (QID) | ORAL | Status: DC
Start: 2013-08-11 — End: 2013-08-10

## 2013-08-09 MED ORDER — BISACODYL 10 MG RE SUPP
10.0000 mg | Freq: Every day | RECTAL | Status: DC
  Administered 2013-08-10 – 2013-08-19 (×9): 10 mg via RECTAL
  Filled 2013-08-09 (×11): qty 1

## 2013-08-09 MED ORDER — PREDNISONE (PAK) 10 MG PO TABS
10.0000 mg | ORAL_TABLET | ORAL | Status: AC
  Administered 2013-08-09: 10 mg via ORAL

## 2013-08-09 MED ORDER — OXYCODONE HCL ER 10 MG PO T12A
10.0000 mg | EXTENDED_RELEASE_TABLET | Freq: Two times a day (BID) | ORAL | Status: DC
  Administered 2013-08-09 – 2013-08-15 (×13): 10 mg via ORAL
  Filled 2013-08-09 (×14): qty 1

## 2013-08-09 MED ORDER — PREDNISONE (PAK) 10 MG PO TABS: 10.0000 mg | ORAL_TABLET | Freq: Three times a day (TID) | ORAL | Status: DC

## 2013-08-09 MED ORDER — OXYCODONE HCL 5 MG PO TABS
15.0000 mg | ORAL_TABLET | ORAL | Status: DC | PRN
  Administered 2013-08-10 (×3): 15 mg via ORAL
  Administered 2013-08-11: 20 mg via ORAL
  Administered 2013-08-11: 15 mg via ORAL
  Administered 2013-08-11 – 2013-08-15 (×18): 20 mg via ORAL
  Administered 2013-08-16: 15 mg via ORAL
  Administered 2013-08-16 – 2013-08-19 (×14): 20 mg via ORAL
  Filled 2013-08-09 (×5): qty 4
  Filled 2013-08-09: qty 3
  Filled 2013-08-09 (×9): qty 4
  Filled 2013-08-09: qty 3
  Filled 2013-08-09 (×4): qty 4
  Filled 2013-08-09: qty 3
  Filled 2013-08-09 (×4): qty 4
  Filled 2013-08-09: qty 3
  Filled 2013-08-09 (×8): qty 4
  Filled 2013-08-09: qty 3
  Filled 2013-08-09 (×4): qty 4

## 2013-08-09 MED ORDER — VANCOMYCIN HCL IN DEXTROSE 750-5 MG/150ML-% IV SOLN
750.0000 mg | Freq: Two times a day (BID) | INTRAVENOUS | Status: DC
  Administered 2013-08-10 – 2013-08-12 (×5): 750 mg via INTRAVENOUS
  Filled 2013-08-09 (×8): qty 150

## 2013-08-09 MED ORDER — PREDNISONE (PAK) 10 MG PO TABS
20.0000 mg | ORAL_TABLET | Freq: Every evening | ORAL | Status: AC
  Administered 2013-08-09: 20 mg via ORAL

## 2013-08-09 MED ORDER — NORTRIPTYLINE HCL 10 MG PO CAPS
10.0000 mg | ORAL_CAPSULE | Freq: Every day | ORAL | Status: DC
  Administered 2013-08-09 – 2013-08-18 (×10): 10 mg via ORAL
  Filled 2013-08-09 (×11): qty 1

## 2013-08-09 NOTE — Progress Notes (Signed)
Patient information reviewed and entered into eRehab system by Adyn Serna, RN, CRRN, PPS Coordinator.  Information including medical coding and functional independence measure will be reviewed and updated through discharge.     Per nursing patient was given "Data Collection Information Summary for Patients in Inpatient Rehabilitation Facilities with attached "Privacy Act Statement-Health Care Records" upon admission.  

## 2013-08-09 NOTE — Evaluation (Addendum)
Occupational Therapy Assessment and Plan & Session Notes  Patient Details  Name: [name] MRN: [id number] Date of Birth: [birth year]  OT Diagnosis: acute pain, ataxia, lumbago (low back pain) and muscle weakness (generalized) Rehab Potential: Rehab Potential: Excellent ELOS: ~3 weeks   Today's Date: 08/09/2013  Problem List:  Patient Active Problem List   Diagnosis Date Noted  . Lumbar degenerative disc disease 08/02/2013  . Post-operative pain 07/29/2013  . Cauda equina syndrome 06/22/2013  . Osteonecrosis 05/05/2013  . Small bowel obstruction 04/17/2013  . Acute renal failure 04/17/2013  . Medial meniscus, posterior horn derangement 02/17/2013  . Knee sprain and strain 02/17/2013  . Trigger point with neck pain 03/18/2012  . Rotator cuff tear arthropathy of right shoulder 09/08/2011  . CAD in native artery 01/09/2011  . Ankle fracture 12/25/2010  . Contusion of shoulder, left 12/25/2010  . PEMPHIGUS VULGARIS 07/02/2010  . MRSA 05/13/2010  . PRURITUS 05/13/2010  . SPINAL STENOSIS, LUMBAR 05/13/2010  . HYPERLIPIDEMIA 04/24/2010  . GASTROESOPHAGEAL REFLUX DISEASE 04/24/2010  . VENTRAL HERNIA, INCISIONAL 04/24/2010  . DEGENERATIVE JOINT DISEASE 04/24/2010  . PYOGENIC ARTHRITIS, SHOULDER REGION 02/04/2010  . HYPERTENSION 01/01/2010  . RUPTURE ROTATOR CUFF 02/19/2009    Past Medical History:  Past Medical History  Diagnosis Date  . Hyperlipidemia   . Small bowel problem     HAD ALOT OF SCAR TISSUE FROM PREVIOUS SURGERIES..NG WAS INSERTED ...Marland KitchenMarland KitchenNO SURGERY NEEDED...Marland KitchenMarland KitchenIN FOR 8 DAYS  . Disorder of blood     BEEN TREATED BY DERMATOLOGIST X 4 YRS..."BLOOD BLISTERS"  . Arthritis   . Gout   . Anxiety   . Hx MRSA infection     rt shoulder  . Pneumonia     "I've had it 3-4 times"  . Coronary artery disease     IN 2000   STENT PLACED IN 2012 sees Dr. Lattie Haw, saw last 2013  . HTN (hypertension)     sees Dr. [name] in [place]  . Small bowel obstruction   .  Ventral hernia   . Acute renal failure 04/17/2013  . AVN (avascular necrosis of bone)     bilateral hips   Past Surgical History:  Past Surgical History  Procedure Laterality Date  . Sternal surg  2000    HAS HAD 5-6 ON HIS STERNUM; "caught MRSA in it"  . Lumbar laminectomy/decompression microdiscectomy  06/13/2011    Procedure: LUMBAR LAMINECTOMY/DECOMPRESSION MICRODISCECTOMY;  Surgeon: Floyce Stakes;  Location: [place] [facility];  Service: Neurosurgery;  Laterality: N/A;  Lumbar three, lumbar four-five Laminectomy  . Eye surgery      bilateral cataract  . Anterior cervical corpectomy  12/17/11  . Incision and drainage of wound  ~ 20ll; 12/03/11    "had infection in my right"  . Shoulder open rotator cuff repair  ~ 2011    right  . Peripherally inserted central catheter insertion  2011 & 11/2011  . Cataract extraction w/ intraocular lens  implant, bilateral  ? 2011  . Coronary artery bypass graft  2000    CABG X5  . Back surgery      lumbar  . Tonsillectomy  1949  . Cholecystectomy  2006 "or after"  . Coronary angioplasty with stent placement  2012  . Anterior cervical corpectomy  12/17/2011    Procedure: ANTERIOR CERVICAL CORPECTOMY;  Surgeon: Floyce Stakes, MD;  Location: [place] [facility];  Service: Neurosurgery;  Laterality: N/A;  Anterior Cervical Decompression Fusion Five to Thoracic Two with plating  . Lumbar laminectomy/decompression  microdiscectomy Right 06/17/2013    Procedure: LUMBAR LAMINECTOMY/DECOMPRESSION MICRODISCECTOMY 1 LEVEL;  Surgeon: Floyce Stakes, MD;  Location: [place] [facility];  Service: Neurosurgery;  Laterality: Right;  Right L3-4 Microdiskectomy  . Lumbar wound debridement N/A 08/02/2013    Procedure: INCISION AND DRAINAGE OF LUMBAR WOUND DEBRIDEMENT;  Surgeon: Floyce Stakes, MD;  Location: [place] [facility];  Service: Neurosurgery;  Laterality: N/A;    Assessment & Plan Clinical Impression: [name] is a 73 y.o. right-handed male well known to  rehabilitation services from recent stay (11/26--07/08/2013) for lumbar L4-S1 decompression complicated by CSF leak and cauda equina syndrome. He was readmitted on 07/30/2013 with progressive low back pain X 3-4 days. MRI of the lumbar spine showed epidural fluid collection and some ring-enhancing fluid collections in the soft tissues. He was placed on broad-spectrum antibiotics and ID consulted for input. They recommended fluid cultures as well as continuing broad spectrum antibiotics. Patient underwent exploration of lumbar wound with removal of fibrosis and decompression of the thecal sac and the right side as well as lumbar L3 and L5 nerve root 08/03/2013 per Dr. Joya Salm. Blood cultures, wound cultures negative but patient received antibiotics at home prior to admission and ID recommends continuing antibiotics--final recommendations pending. Patient with LLE pain with edema as well as pain/difficulty with weight bearing. LLE dopplers negative for DVT. Ortho was consulted due to left shoulder pain. Shoulder aspiration and patient treated with steroids for L-RTC tear with exacerbated symptoms. He continues with bowel incontinence due to cauda equina syndrome as well as drainage from incision. CIR recommended by therapy team and patient admitted today. Patient transferred to CIR on 08/08/2013 .    Patient currently requires min>total assist with basic self-care skills secondary to muscle weakness, muscle joint tightness and muscle paralysis and decreased sitting balance, decreased standing balance, decreased postural control, decreased balance strategies and difficulty maintaining precautions.  Prior to hospitalization, patient could complete ADLs at a mod I level from w/c. Patient with a significant decline in functional progress since last CIR admission.   Patient will benefit from skilled intervention to increase independence with basic self-care skills prior to discharge home with care partner.  Anticipate  patient will require intermittent supervision and follow up home health.  OT - End of Session Activity Tolerance: Tolerates 10 - 20 min activity with multiple rests Endurance Deficit: Yes Endurance Deficit Description: Pt very limited during session due to pain, nausea, and general deconditioning OT Assessment Rehab Potential: Excellent Barriers to Discharge:  (none known at this time) OT Patient demonstrates impairments in the following area(s): Balance;Edema;Endurance;Motor;Nutrition;Pain;Perception;Safety;Sensory;Skin Integrity OT Basic ADL's Functional Problem(s): Grooming;Bathing;Dressing;Toileting OT Advanced ADL's Functional Problem(s): Light Housekeeping OT Transfers Functional Problem(s): Toilet;Tub/Shower OT Additional Impairment(s): Fuctional Use of Upper Extremity OT Plan OT Intensity: Minimum of 1-2 x/day, 45 to 90 minutes OT Frequency: 5 out of 7 days OT Duration/Estimated Length of Stay: ~3 weeks OT Treatment/Interventions: Balance/vestibular training;Community reintegration;Discharge planning;DME/adaptive equipment instruction;Functional mobility training;Neuromuscular re-education;Pain management;Patient/family education;Psychosocial support;Self Care/advanced ADL retraining;Skin care/wound managment;Splinting/orthotics;Therapeutic Exercise;Therapeutic Activities;UE/LE Strength taining/ROM;Wheelchair propulsion/positioning;UE/LE Coordination activities OT Self Feeding Anticipated Outcome(s): independent (current level) OT Basic Self-Care Anticipated Outcome(s): mod I w/c level OT Toileting Anticipated Outcome(s): mod I w/c level OT Bathroom Transfers Anticipated Outcome(s): mod I w/c level OT Recommendation Patient destination: Home Follow Up Recommendations: Home health OT Equipment Recommended: None recommended by OT Equipment Details: Patient currently has a BSC and tub transfer bench  Precautions/Restrictions  Precautions Precautions: Back;Fall Precaution  Comments: Patient able to verbalize 2/3 back  precautions Required Braces or Orthoses:  (has abdominal binder longstanding; L AFO) Restrictions Weight Bearing Restrictions: No  General Chart Reviewed: Yes Amount of Missed OT Time (min): 30 Minutes Family/Caregiver Present: No  Pain Pain Assessment Pain Assessment: 0-10 Pain Score: 8  Faces Pain Scale: Hurts even more Pain Type: Acute pain Pain Location: Back Pain Orientation: Lower Pain Descriptors / Indicators: Aching Pain Frequency: Intermittent Pain Onset: Gradual Patients Stated Pain Goal: 3 Pain Intervention(s): Medication (See eMAR) Multiple Pain Sites: Yes 2nd Pain Site Pain Score: 6 [name]-Baker Pain Rating: Hurts even more Pain Type: Acute pain Pain Location: Leg Pain Orientation: Right;Left Pain Descriptors / Indicators: Aching Pain Intervention(s): RN made aware  Home Living/Prior [place] expects to be discharged to:: Private residence Living Arrangements: Spouse/significant other Available Help at Discharge: Family;Available 24 hours/day Type of Home: House Home Access: Stairs to enter [employer] of Steps: 1 from back entrance; 3-4 from front entrance Entrance Stairs-Rails: None Home Layout: One level Additional Comments: Patient has hip kit at home  Lives With: Spouse IADL History Homemaking Responsibilities: Yes Meal Prep Responsibility: Secondary Laundry Responsibility: Secondary Cleaning Responsibility: Secondary Bill Paying/Finance Responsibility: Secondary Shopping Responsibility: Secondary Child Care Responsibility: No Current License: Yes Mode of Transportation: Car Occupation: Part time employment 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 [place]: play golf, coach basketball, 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  Prior Function Level of Independence: Independent with transfers;Requires assistive device for independence  (left CIR in Dec 2014 at w/c level mod I)  Able to Take Stairs?: No Driving: No Vocation: Part time employment  ADL - See FIM  Vision/Perception  Vision - History Baseline Vision: Wears glasses all the time Patient Visual Report: No change from baseline Vision - Assessment Eye Alignment: Within Functional Limits Perception Perception: Within Functional Limits Praxis Praxis: Intact   Cognition Overall Cognitive Status: Within Functional Limits for tasks assessed Orientation Level: Oriented X4 Memory: Appears intact Awareness: Appears intact Problem Solving: Appears intact Safety/Judgment: Appears intact  Sensation Sensation Light Touch: Impaired Detail Light Touch Impaired Details: Impaired LLE;Impaired RLE (absent in bilat feet; decreased to LT on LLE) Proprioception: Impaired by gross assessment Additional Comments: Significant pain in bilateral heels and feet Coordination Gross Motor Movements are Fluid and Coordinated: No Fine Motor Movements are Fluid and Coordinated: Yes  Motor  Motor Motor: Paraplegia;Abnormal postural alignment and control;Motor impersistence  Trunk/Postural Assessment  Cervical Assessment Cervical Assessment: Within Functional Limits Thoracic Assessment Thoracic Assessment: Exceptions to [facility] (flexed posture; back precautions) Lumbar Assessment Lumbar Assessment: Exceptions to [facility] (back precautions)   Balance Static Sitting Balance Static Sitting - Level of Assistance: 4: Min assist Dynamic Sitting Balance Dynamic Sitting - Level of Assistance: 4: Min assist;3: Mod assist  Extremity/Trunk Assessment RUE Assessment RUE Assessment: Exceptions to [facility] RUE AROM (degrees) Overall AROM Right Upper Extremity: Deficits;Due to premorbid status (Again, patient able to compensate for deficits. Patient with increased fatigue throughout BUEs during this evaluation) RUE PROM (degrees) Overall PROM Right Upper Extremity: Within functional limits for  tasks performed LUE Assessment LUE Assessment: Exceptions to WFL LUE AROM (degrees) Overall AROM Left Upper Extremity: Due to premorbid status;Deficits (Again, patient able to compensate for deficits. Patient with increased fatigue throughout BUEs during this evaluation) LUE PROM (degrees) Overall PROM Left Upper Extremity: Within functional limits for tasks assessed  FIM:  FIM - Eating Eating Activity: 7: Complete independence:no helper FIM - Grooming Grooming Steps: Wash, rinse, dry face;Wash, rinse, dry hands;Oral care, brush  teeth, clean dentures Grooming: 5: Set-up assist to obtain items FIM - Bathing Bathing: 0: Activity did not occur FIM - Upper Body Dressing/Undressing Upper body dressing/undressing steps patient completed: Thread/unthread right sleeve of pullover shirt/dresss;Thread/unthread left sleeve of pullover shirt/dress;Pull shirt over trunk Upper body dressing/undressing: 4: Min-Patient completed 75 plus % of tasks FIM - Lower Body Dressing/Undressing Lower body dressing/undressing: 1: Total-Patient completed less than 25% of tasks FIM - Toileting Toileting: 0: Activity did not occur FIM - Control and instrumentation engineer Devices: Arm rests Bed/Chair Transfer: 2: Chair or W/C > Bed: Max A (lift and lower assist);3: Sit > Supine: Mod A (lifting assist/Pt. 50-74%/lift 2 legs) FIM - Toilet Transfers Toilet Transfers: 0-Activity did not occur FIM - Tub/Shower Transfers Tub/shower Transfers: 0-Activity did not occur or was simulated   Refer to Care Plan for Long Term Goals  Recommendations for other services: Neuropsych if patient willing.  Discharge Criteria: Patient will be discharged from OT if patient refuses treatment 3 consecutive times without medical reason, if treatment goals not met, if there is a change in medical status, if patient makes no progress towards goals or if patient is discharged from hospital.  The above assessment, treatment  plan, treatment alternatives and goals were discussed and mutually agreed upon: by patient  ---------------------------------------------------------------------------------------------------------------------------------------------  SESSION NOTES  Session #1 [id number] - 65 Minutes Individual Therapy Patient with 6/10 pain in BLEs and back; RN made aware Initial 1:1 occupational therapy evaluation completed. Focused skilled intervention on bed mobility (side rolling left <>right for brief change secondary to incontinent BM & LB dressing), side lying > sit EOB (with moderate assistance), UB dressing seated EOB, EOB > w/c transfer (scoot with moderate assistance), grooming tasks seated in w/c at sink, and overall activity tolerance/endurance. Pain was patient's biggest barrier during this therapy evaluation/session. At end of session, left patient seated in w/c with RN present for medication management.   Session #2 Patient missed 30 minutes of skilled OT secondary to refusal secondary to nausea and fatigue. Upon entering room, patient found supine in bed. Therapist talked with patient about talking with a neuropsychologist. Therapist wrote referral for neuropsychologist to evaluate patient regarding depressive symptoms and coping strategies for pain and other issues. Therapist gave patient diet ginger ale per his request and notified RN of missed time. Left patient supine in bed with all needed items within reach.   , 08/09/2013, 11:00 AM

## 2013-08-09 NOTE — Progress Notes (Signed)
Subjective/Complaints: Feet, heels sore. Pain often woke him at night.  A 12 point review of systems has been performed and if not noted above is otherwise negative.   Objective: Vital Signs: Blood pressure 112/48, pulse 79, temperature 98.4 F (36.9 C), temperature source Oral, resp. rate 20, height 6' (1.829 m), weight 81.602 kg (179 lb 14.4 oz), SpO2 95.00%. Dg Abd 1 View  08/09/2013   CLINICAL DATA:  Diarrhea.  Rule out obstipation.  EXAM: ABDOMEN - 1 VIEW  COMPARISON:  04/17/2013 abdominal CT  FINDINGS: The bowel gas pattern is normal. No radio-opaque calculi. Lower lumbar fixation and discectomy changes. Cholecystectomy changes. Ventral abdominal wall hernia repair with mesh.  IMPRESSION: Negative for bowel obstruction.   Electronically Signed   By: Tiburcio Pea M.D.   On: 08/09/2013 05:36    Recent Labs  08/08/13 1155 08/09/13 0555  WBC 6.4 6.7  HGB 10.1* 9.4*  HCT 29.7* 28.5*  PLT 462* 411*    Recent Labs  08/08/13 1155 08/09/13 0555  NA 138 139  K 4.5 4.6  CL 101 103  GLUCOSE 141* 105*  BUN 10 10  CREATININE 1.02 1.12  CALCIUM 8.9 8.5   CBG (last 3)  No results found for this basename: GLUCAP,  in the last 72 hours  Wt Readings from Last 3 Encounters:  08/08/13 81.602 kg (179 lb 14.4 oz)  07/30/13 81.602 kg (179 lb 14.4 oz)  07/30/13 81.602 kg (179 lb 14.4 oz)    Physical Exam:  Constitutional: He is oriented to person, place, and time. He appears well-developed and well-nourished.  HENT:  Head: Normocephalic and atraumatic.  Right Ear: External ear normal.  Left Ear: External ear normal.  Eyes: Conjunctivae are normal. Pupils are equal, round, and reactive to light.  Neck: Normal range of motion. Neck supple. No JVD present. No tracheal deviation present. No thyromegaly present.  Cardiovascular: Normal rate, regular rhythm and normal heart sounds.  Respiratory: Effort normal and breath sounds normal. No respiratory distress. He has no wheezes. He  has no rales.  GI: Soft. Bowel sounds are normal. He exhibits no distension. There is no tenderness. There is no rebound.  Abdominal wall weakness due to prior surgery.  Musculoskeletal: He exhibits tenderness (BLE shin/ankle with areas of dysesthesias. ).  Right forearm with reddened and edematous area--prior site of IV infiltration? Bilateral feet with 2- + pitting edema. Left foot drop.  Lymphadenopathy:  He has no cervical adenopathy.  Neurological: He is alert and oriented to person, place, and time.  4+/5 strength bilateral UE's proximal to distal. Normal sensation in bilateral upper extremities Absent sensation left foot but intact above the ankle Left lower extremity strength 2+ to 3 minus hip flexion, 3+ knee extension 1/5 ankle plantarflexion 0/5 ankle dorsiflexion Near normal sensation right foot and right lower extremity Right lower extremity strength is 3/5 hip flexion knee extension, 2+ to 3 minus ankle dorsiflexion and plantarflexion  Skin: Skin is warm.  Left buttock with chapped area and intergluteal irritation noted. Perineum abraded, peeling reddened skin under a layer of cream. Lower back incision clean and dry. Heels dry, normal color Psychiatric: His speech is normal. Judgment and thought content normal. His mood appears anxious. He is withdrawn. Cognition and memory are normal.    Assessment/Plan: 1. Functional deficits secondary to cauda equina, wound infection s/p I and D which require 3+ hours per day of interdisciplinary therapy in a comprehensive inpatient rehab setting. Physiatrist is providing close team supervision and 24 hour  management of active medical problems listed below. Physiatrist and rehab team continue to assess barriers to discharge/monitor patient progress toward functional and medical goals. FIM:                   Comprehension Comprehension Mode: Auditory Comprehension: 7-Follows complex conversation/direction: With no  assist  Expression Expression Mode: Verbal Expression: 7-Expresses complex ideas: With no assist  Social Interaction Social Interaction: 7-Interacts appropriately with others - No medications needed.  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  1. DVT Prophylaxis/Anticoagulation: Pharmaceutical: Lovenox  2. Pain Management: Continue neurontin for dysesthesias- .   -add low dose TCA at night 3. Mood: Team to provide ego support for elevated levels of anxiety. :CSW to follow for evaluation and support.  4. Neuropsych: This patient is capable of making decisions on his own behalf.  5. Neurogenic bladder: Continue Flomax, Cardura and urecholine. Continue foley for now. Will treat yeast infection with short course of diflucan.  6. Neurogenic bowel: Will re-establish bowel program. Suppository daily in am. KUB---clean   7. Anemia: hgb 9.4---track serially 8. Skin: Candidal irritation perineal area as well as MAST left buttock due to bowel incontince  -continue local care, keep skin dry, clean, turn  LOS (Days) 1 A FACE TO FACE EVALUATION WAS PERFORMED  Mckaylin Bastien T 08/09/2013 7:56 AM

## 2013-08-09 NOTE — Progress Notes (Signed)
ANTIBIOTIC CONSULT NOTE - FOLLOW UP  Pharmacy Consult for rocephin/vancomycin Indication: lumbar wound  Allergies  Allergen Reactions  . Morphine Other (See Comments)    REACTION: made him go crazy; "I had fun w/it; my family didn't think it was too funny"  . Metoprolol Other (See Comments)    REACTION:  Tachycardia; "don't remember how bad it was; it was so long ago"    Patient Measurements: Height: 6' (182.9 cm) Weight: 179 lb 14.4 oz (81.602 kg) IBW/kg (Calculated) : 77.6  Vital Signs: Temp: 98.4 F (36.9 C) (01/13 0544) Temp src: Oral (01/13 0544) BP: 112/48 mmHg (01/13 0544) Pulse Rate: 79 (01/13 0544) Intake/Output from previous day: 01/12 0701 - 01/13 0700 In: -  Out: 1450 [Urine:1450] Intake/Output from this shift: Total I/O In: 480 [P.O.:480] Out: -   Labs:  Recent Labs  08/08/13 1155 08/09/13 0555  WBC 6.4 6.7  HGB 10.1* 9.4*  PLT 462* 411*  CREATININE 1.02 1.12   Estimated Creatinine Clearance: 67.4 ml/min (by C-G formula based on Cr of 1.12).  Recent Labs  08/09/13 0927  VANCOTROUGH 24.0*      Assessment: 73 y/o M on Abx D#11 for epidural abscesses, s/p I&D of site. ID = presumed lumbar infxn and will treat for 6 wks. Afebrile, WBC WNL. Vancomycin trough this morning = 24, but last night's dose was given 1.5 delayed. Actually trough probably around or slightly above upper end goal. Scr 1.12 slightly up from last week. RN already gave 1000 vanc dose before vanc trough level back.  1/3 Vanco>>  --1/5 VT = 12.7 >> change vanc to 1g q12 --1/7 VT = 16.1 ok  1/3 Rocephin>> 1/3 Rifampin>>  1/3 BC x 2>>negative 1/6 Wound>>negative  Goal of Therapy:  Vancomycin trough level 15-20 mcg/ml  Plan:  - Continue rocephin 2gm IV Q24H - Decrease vancomycin to 750 mg IV Q 12 hrs next dose 0400 - f/u renal function, weekly BMET on Monday  Bayard HuggerMei Norelle Runnion, PharmD, BCPS  Clinical Pharmacist  Pager: [phone]    08/09/2013 3:04 PM

## 2013-08-09 NOTE — Progress Notes (Signed)
Physical Therapy Session Note  Patient Details  Name: [name] MRN: [id number] Date of Birth: 04/24/1941  Today's Date: 08/09/2013 Time: [id number] Time Calculation (min): 42 min  Short Term Goals: Week 1:  PT Short Term Goal 1 (Week 1): Pt will be able to transfer bed <-> w/c with mod A PT Short Term Goal 2 (Week 1): Pt will be able to perform supine <-> sit with min A maintaining back precautions PT Short Term Goal 3 (Week 1): Pt will be able to propel w/c on unit x 100' with S  Skilled Therapeutic Interventions/Progress Updates:  C/o pain in back and feet - premedicated and pt reports he feels better than this morning. Focused on bed mobility, dynamic sitting balance EOB, and seated LE therex including LAQ, marches, and isometric hip abduction/adduction. Pt overall S/min A with dynamic sitting balance edge of bed and required mod A with bed mobility. Elevated LE's on pillows to aid with edema control.   Therapy Documentation Precautions:  Precautions Precautions: Back;Fall Precaution Comments: Patient able to verbalize 2/3 back precautions Required Braces or Orthoses:  (has abdominal binder longstanding; L AFO) Restrictions Weight Bearing Restrictions: No  See FIM for current functional status  Therapy/Group: Individual Therapy  Karolee StampsGray, Saina Waage [facility] 08/09/2013, 2:05 PM

## 2013-08-09 NOTE — Progress Notes (Signed)
Patient complaining of bilateral foot pain R>L --feels like gout flare. Has been on tapering dose prednisone since Dec and itching is getting worse (due to dyscrasia).  Also feels that his pain is not being managed and would like meds adjusted. On exam Right great toe with erythema and extreme tenderness. Left toe tender but not erythematous. Will start steroid dose pack for gout and add oxycontin for more consistent pain relief.

## 2013-08-09 NOTE — Patient Care Conference (Signed)
Inpatient RehabilitationTeam Conference and Plan of Care Update Date: 08/09/2013   Time: 2:05 PM    Patient Name: [name]      Medical Record Number: [id number]  Date of Birth: 10/20/1940 Sex: Male         Room/Bed: 4W17C/4W17C-01 Payor Info: Payor: MEDICARE / Plan: MEDICARE PART A AND B / Product Type: *No Product type* /    Admitting Diagnosis: Cauda Equine  Admit Date/Time:  08/08/2013  6:12 PM Admission Comments: No comment available   Primary Diagnosis:  <principal problem not specified> Principal Problem: <principal problem not specified>  Patient Active Problem List   Diagnosis Date Noted  . Lumbar degenerative disc disease 08/02/2013  . Post-operative pain 07/29/2013  . Cauda equina syndrome 06/22/2013  . Osteonecrosis 05/05/2013  . Small bowel obstruction 04/17/2013  . Acute renal failure 04/17/2013  . Medial meniscus, posterior horn derangement 02/17/2013  . Knee sprain and strain 02/17/2013  . Trigger point with neck pain 03/18/2012  . Rotator cuff tear arthropathy of right shoulder 09/08/2011  . CAD in native artery 01/09/2011  . Ankle fracture 12/25/2010  . Contusion of shoulder, left 12/25/2010  . PEMPHIGUS VULGARIS 07/02/2010  . MRSA 05/13/2010  . PRURITUS 05/13/2010  . SPINAL STENOSIS, LUMBAR 05/13/2010  . HYPERLIPIDEMIA 04/24/2010  . GASTROESOPHAGEAL REFLUX DISEASE 04/24/2010  . VENTRAL HERNIA, INCISIONAL 04/24/2010  . DEGENERATIVE JOINT DISEASE 04/24/2010  . PYOGENIC ARTHRITIS, SHOULDER REGION 02/04/2010  . HYPERTENSION 01/01/2010  . RUPTURE ROTATOR CUFF 02/19/2009    Expected Discharge Date: Expected Discharge Date: 08/30/13  Team Members Present: Physician leading conference: Dr. Faith Rogue Social Worker Present: Amada Jupiter, LCSW Nurse Present: Leland Johns, RN PT Present: Karolee Stamps, Jerrye Bushy, PT OT Present: Edwin Cap, Loistine Chance, OT PPS Coordinator present : Tora Duck, RN, CRRN     Current Status/Progress Goal  Weekly Team Focus  Medical   cauda equina, pain issues related to gout. wound i and d, abx rx  improve activity tolerance to allow participation in therapy.,   rx gout, pain, mood rx and support   Bowel/Bladder   LBM 08/08/13.  Foley catheter intact due to acute retention.  Foley catheter maintainance.  Monitor BM pattern.  Medicate prn      Swallow/Nutrition/ Hydration             ADL's   Min for UB ADLs, total for LB ADLs, Mod assist for transfers (scoot)  overall mod I at w/c level  ADL retraining from w/c level, functional transfers, pain management, UB strengthening, overall activity tolerance/endurance, functional mobility   Mobility   mod to max A overall  min A transfers, mod I w/c mobility  neuro re-ed for balance and mobility, functional strengthening, endurance, pain management, activity tolerance   Communication             Safety/Cognition/ Behavioral Observations  Alarm in bed/chair.  2+ max assist with transfers.  Min assist with transfers.  Monitor for unsafe behavior; keep free from injury.   Pain   PRN:  Robaxin 1000 mg IV q 6 hrs for muscle spasms; Oxy IR 10 mg q 4 hrs pain; Ultram 50 mg q 6 hrs pain  Pain level of <3  Monitor for pain q shift and prn; medicate appropriately   Skin   large amt bruising BUE; steri strip intact to low back incision; rash to rt forearm, scrotum, bil groin  no additional skin issues  Monitor for signs of infection; continue Nystatin for rashes  Rehab Goals Patient on target to meet rehab goals: Yes *See Care Plan and progress notes for long and short-term goals.  Barriers to Discharge: pain control, neurological deficits, mood    Possible Resolutions to Barriers:  medical mgt, pain control, egosupport    Discharge Planning/Teaching Needs:  home with wife and family who can provide assistance      Team Discussion:  New eval for this gentleman who is familiar to CIR (here 12/14).  Not feeling well on this initial eval day -  clearly wanted to participate, however, just could not tolerate much.  Mood is more depressed than previous stay.  Hoping to reach min assist w/c goals if possible.  Revisions to Treatment Plan:  None   Continued Need for Acute Rehabilitation Level of Care: The patient requires daily medical management by a physician with specialized training in physical medicine and rehabilitation for the following conditions: Daily direction of a multidisciplinary physical rehabilitation program to ensure safe treatment while eliciting the highest outcome that is of practical value to the patient.: Yes Daily medical management of patient stability for increased activity during participation in an intensive rehabilitation regime.: Yes Daily analysis of laboratory values and/or radiology reports with any subsequent need for medication adjustment of medical intervention for : Post surgical problems;Neurological problems;Other  Batoul Limes 08/09/2013, 6:01 PM

## 2013-08-09 NOTE — Progress Notes (Signed)
Physical Therapy Assessment and Plan  Patient Details  Name: [name] MRN: [id number] Date of Birth: 1941-01-27  PT Diagnosis: Difficulty walking, Edema, Impaired sensation, Low back pain, Muscle weakness, Acute Pain and Paraplegia Rehab Potential: Good ELOS: 3 weeks   Today's Date: 08/09/2013 Time: 3419-3790 Time Calculation (min): 54 min  Problem List:  Patient Active Problem List   Diagnosis Date Noted  . Lumbar degenerative disc disease 08/02/2013  . Post-operative pain 07/29/2013  . Cauda equina syndrome 06/22/2013  . Osteonecrosis 05/05/2013  . Small bowel obstruction 04/17/2013  . Acute renal failure 04/17/2013  . Medial meniscus, posterior horn derangement 02/17/2013  . Knee sprain and strain 02/17/2013  . Trigger point with neck pain 03/18/2012  . Rotator cuff tear arthropathy of right shoulder 09/08/2011  . CAD in native artery 01/09/2011  . Ankle fracture 12/25/2010  . Contusion of shoulder, left 12/25/2010  . PEMPHIGUS VULGARIS 07/02/2010  . MRSA 05/13/2010  . PRURITUS 05/13/2010  . SPINAL STENOSIS, LUMBAR 05/13/2010  . HYPERLIPIDEMIA 04/24/2010  . GASTROESOPHAGEAL REFLUX DISEASE 04/24/2010  . VENTRAL HERNIA, INCISIONAL 04/24/2010  . DEGENERATIVE JOINT DISEASE 04/24/2010  . PYOGENIC ARTHRITIS, SHOULDER REGION 02/04/2010  . HYPERTENSION 01/01/2010  . RUPTURE ROTATOR CUFF 02/19/2009    Past Medical History:  Past Medical History  Diagnosis Date  . Hyperlipidemia   . Small bowel problem     HAD ALOT OF SCAR TISSUE FROM PREVIOUS SURGERIES..NG WAS INSERTED ...Marland KitchenMarland KitchenNO SURGERY NEEDED...Marland KitchenMarland KitchenIN FOR 8 DAYS  . Disorder of blood     BEEN TREATED BY DERMATOLOGIST X 4 YRS..."BLOOD BLISTERS"  . Arthritis   . Gout   . Anxiety   . Hx MRSA infection     rt shoulder  . Pneumonia     "I've had it 3-4 times"  . Coronary artery disease     IN 2000   STENT PLACED IN 2012 sees Dr. Lattie Haw, saw last 2013  . HTN (hypertension)     sees Dr. [name] in [place]  .  Small bowel obstruction   . Ventral hernia   . Acute renal failure 04/17/2013  . AVN (avascular necrosis of bone)     bilateral hips   Past Surgical History:  Past Surgical History  Procedure Laterality Date  . Sternal surg  2000    HAS HAD 5-6 ON HIS STERNUM; "caught MRSA in it"  . Lumbar laminectomy/decompression microdiscectomy  06/13/2011    Procedure: LUMBAR LAMINECTOMY/DECOMPRESSION MICRODISCECTOMY;  Surgeon: Floyce Stakes;  Location: [place] [facility];  Service: Neurosurgery;  Laterality: N/A;  Lumbar three, lumbar four-five Laminectomy  . Eye surgery      bilateral cataract  . Anterior cervical corpectomy  12/17/11  . Incision and drainage of wound  ~ 20ll; 12/03/11    "had infection in my right"  . Shoulder open rotator cuff repair  ~ 2011    right  . Peripherally inserted central catheter insertion  2011 & 11/2011  . Cataract extraction w/ intraocular lens  implant, bilateral  ? 2011  . Coronary artery bypass graft  2000    CABG X5  . Back surgery      lumbar  . Tonsillectomy  1949  . Cholecystectomy  2006 "or after"  . Coronary angioplasty with stent placement  2012  . Anterior cervical corpectomy  12/17/2011    Procedure: ANTERIOR CERVICAL CORPECTOMY;  Surgeon: Floyce Stakes, MD;  Location: [place] [facility];  Service: Neurosurgery;  Laterality: N/A;  Anterior Cervical Decompression Fusion Five to Thoracic Two with  plating  . Lumbar laminectomy/decompression microdiscectomy Right 06/17/2013    Procedure: LUMBAR LAMINECTOMY/DECOMPRESSION MICRODISCECTOMY 1 LEVEL;  Surgeon: Floyce Stakes, MD;  Location: [place] [facility];  Service: Neurosurgery;  Laterality: Right;  Right L3-4 Microdiskectomy  . Lumbar wound debridement N/A 08/02/2013    Procedure: INCISION AND DRAINAGE OF LUMBAR WOUND DEBRIDEMENT;  Surgeon: Floyce Stakes, MD;  Location: [place] [facility];  Service: Neurosurgery;  Laterality: N/A;    Assessment & Plan Clinical Impression: Patient is a 73 y.o. year old male well known  to rehabilitation services from recent stay (11/26--07/08/2013) for lumbar L4-S1 decompression complicated by CSF leak and cauda equina syndrome. He was readmitted on 07/30/2013 with progressive low back pain X 3-4 days. MRI of the lumbar spine showed epidural fluid collection and some ring-enhancing fluid collections in the soft tissues. He was placed on broad-spectrum antibiotics and ID consulted for input. They recommended fluid cultures as well as continuing broad spectrum antibiotics. Patient underwent exploration of lumbar wound with removal of fibrosis and decompression of the thecal sac and the right side as well as lumbar L3 and L5 nerve root 08/03/2013 per Dr. Joya Salm.   Blood cultures, wound cultures negative but patient received antibiotics at home prior to admission and ID recommends continuing antibiotics--final recommendations pending. Patient with LLE pain with edema as well as pain/difficulty with weight bearing. LLE dopplers negative for DVT. Ortho was consulted due to left shoulder pain. Shoulder aspiration and patient treated with steroids for L-RTC tear with exacerbated symptoms. He continues with bowel incontinence due to cauda equina syndrome as well as drainage from incision.  Patient transferred to CIR on 08/08/2013 .   Patient currently requires mod to max A with mobility secondary to muscle weakness, muscle joint tightness and muscle paralysis, decreased cardiorespiratoy endurance, decreased coordination and decreased sitting balance, decreased standing balance, decreased balance strategies and difficulty maintaining precautions.  Prior to hospitalization, patient was modified independent  with mobility and lived with Spouse in a House.  Home access is 1 from back entrance; 3-4 from front entranceStairs to enter.  Patient will benefit from skilled PT intervention to maximize safe functional mobility, minimize fall risk and decrease caregiver burden for planned discharge home with 24  hour supervision.  Anticipate patient will benefit from follow up [place] at discharge.  PT - End of Session Activity Tolerance: Decreased this session Endurance Deficit: Yes Endurance Deficit Description: Pt very limited during session due to pain, nausea, and general deconditioning PT Assessment Rehab Potential: Good Barriers to Discharge: Inaccessible home environment;Decreased caregiver support PT Patient demonstrates impairments in the following area(s): Balance;Edema;Endurance;Motor;Pain;Sensory;Skin Integrity PT Transfers Functional Problem(s): Bed Mobility;Bed to Chair;Car;Furniture PT Locomotion Functional Problem(s): Wheelchair Mobility;Ambulation;Stairs PT Plan PT Intensity: Minimum of 1-2 x/day ,45 to 90 minutes PT Frequency: 5 out of 7 days PT Duration Estimated Length of Stay: 3 weeks PT Treatment/Interventions: Ambulation/gait training;Balance/vestibular training;Community reintegration;Discharge planning;Disease management/prevention;DME/adaptive equipment instruction;Functional mobility training;Neuromuscular re-education;Pain management;Patient/family education;Psychosocial support;Skin care/wound management;Splinting/orthotics;Stair training;Therapeutic Activities;Therapeutic Exercise;UE/LE Strength taining/ROM;UE/LE Coordination activities;Wheelchair propulsion/positioning PT Transfers Anticipated Outcome(s): min A w/c level PT Locomotion Anticipated Outcome(s): mod I w/c mobility PT Recommendation Recommendations for Other Services: Neuropsych consult Follow Up Recommendations: Home health PT Patient destination: Home Equipment Recommended: None recommended by PT (pt received w/c from prior CIR admission)  Skilled Therapeutic Intervention Individual treatment with focus on transfer back to bed (scoot pivot with max A and cues to maintain forward weightshift during transfer), bed mobility for sit to supine and rolling (mod A) for hygiene due to  incontinent BM, and education  on positioning. Pt with increased pain and c/o nausea throughout session - RN notified and aware.   PT Evaluation Precautions/Restrictions Precautions Precautions: Back;Fall Precaution Comments: Patient able to verbalize 2/3 back precautions Required Braces or Orthoses:  (has abdominal binder longstanding; L AFO) Restrictions Weight Bearing Restrictions: No Pain C/o pain in back and bilateral feet - premedicated. Pt also very nauseous during session - RN made aware.  Home Living/Prior Functioning Home Living Available Help at Discharge: Family;Available 24 hours/day Type of Home: House Home Access: Stairs to enter [employer] of Steps: 1 from back entrance; 3-4 from front entrance Entrance Stairs-Rails: None Home Layout: One level Additional Comments: Patient has hip kit at home  Lives With: Spouse Prior Function Level of Independence: Independent with transfers;Requires assistive device for independence (left CIR in Dec 2014 at w/c level mod I)  Able to Take Stairs?: No Driving: No Vocation: Part time employment Vision/Perception  Vision - History Baseline Vision: Wears glasses all the time Patient Visual Report: No change from baseline Vision - Assessment Eye Alignment: Within Functional Limits Perception Perception: Within Functional Limits Praxis Praxis: Intact  Cognition Overall Cognitive Status: Within Functional Limits for tasks assessed Orientation Level: Oriented X4 Memory: Appears intact Awareness: Appears intact Problem Solving: Appears intact Safety/Judgment: Appears intact Sensation Sensation Light Touch: Impaired Detail Light Touch Impaired Details: Impaired LLE;Impaired RLE (absent in bilat feet; decreased to LT on LLE) Proprioception: Impaired by gross assessment Additional Comments: Significant pain in bilateral heels and feet Coordination Gross Motor Movements are Fluid and Coordinated: No Fine Motor Movements are Fluid and Coordinated:  Yes Motor  Motor Motor: Paraplegia;Abnormal postural alignment and control;Motor impersistence  Trunk/Postural Assessment  Cervical Assessment Cervical Assessment: Within Functional Limits Thoracic Assessment Thoracic Assessment: Exceptions to [facility] (flexed posture; back precautions) Lumbar Assessment Lumbar Assessment: Exceptions to [facility] (back precautions)  Balance Static Sitting Balance Static Sitting - Level of Assistance: 4: Min assist Dynamic Sitting Balance Dynamic Sitting - Level of Assistance: 4: Min assist;3: Mod assist Extremity Assessment  RUE Assessment RUE Assessment: Exceptions to [facility] RUE AROM (degrees) Overall AROM Right Upper Extremity: Deficits;Due to premorbid status (Again, patient able to compensate for deficits. Patient with increased fatigue throughout BUEs during this evaluation) RUE PROM (degrees) Overall PROM Right Upper Extremity: Within functional limits for tasks performed LUE Assessment LUE Assessment: Exceptions to WFL LUE AROM (degrees) Overall AROM Left Upper Extremity: Due to premorbid status;Deficits (Again, patient able to compensate for deficits. Patient with increased fatigue throughout BUEs during this evaluation) LUE PROM (degrees) Overall PROM Left Upper Extremity: Within functional limits for tasks assessed RLE Assessment RLE Assessment: Exceptions to [facility] RLE Strength RLE Overall Strength Comments: grossly 3-/5; difficulty sustaining contraction LLE Assessment LLE Assessment: Exceptions to [facility] LLE Strength LLE Overall Strength Comments: 2+/5 hip; 3-/5 knee; chronic foot drop  FIM:  FIM - Bed/Chair Transfer Bed/Chair Transfer Assistive Devices: Arm rests Bed/Chair Transfer: 2: Chair or W/C > Bed: Max A (lift and lower assist);3: Sit > Supine: Mod A (lifting assist/Pt. 50-74%/lift 2 legs) FIM - Locomotion: Ambulation Locomotion: Ambulation: 0: Activity did not occur (unsafe to attempt at eval) FIM - Locomotion: Stairs Locomotion:  Stairs: 0: Activity did not occur (unsafe to attempt on eval)   Refer to Care Plan for Long Term Goals  Recommendations for other services: Neuropsych  Discharge Criteria: Patient will be discharged from PT if patient refuses treatment 3 consecutive times without medical reason, if treatment goals not met, if there is a  change in medical status, if patient makes no progress towards goals or if patient is discharged from hospital.  The above assessment, treatment plan, treatment alternatives and goals were discussed and mutually agreed upon: by patient  Allayne Gitelman 08/09/2013, 10:51 AM

## 2013-08-09 NOTE — Progress Notes (Signed)
Stable, got the left shoulder infiltrate over the weekend. Pain not only in the left foot as before but now in the right one started on prednisone. Continue with pt/ot.

## 2013-08-09 NOTE — Consult Note (Signed)
WOC wound consult note Reason for Consult: evaluation of wound. Pt s/p lumbar surgery 08/02/13. Now in rehab asked to evaluate wound.  Wound type: lumbar surgical wound Measurement:10cm linear wound Wound bed: closed incision with some steri strips intact at the most proximal portion of the wound Drainage (amount, consistency, odor) none Periwound: intact Dressing procedure/placement/frequency: no topical care needed at this time.  Should it become any issues with this site rubbing brace or otherwise now that he is more active, could use silicone foam to protect site.   Discussed POC with patient and bedside nurse.  Re consult if needed, will not follow at this time. Thanks  Lakindra Wible [employer] RN, CWOCN [phone])

## 2013-08-10 ENCOUNTER — Inpatient Hospital Stay (HOSPITAL_COMMUNITY): Payer: Medicare Other | Admitting: Occupational Therapy

## 2013-08-10 ENCOUNTER — Inpatient Hospital Stay (HOSPITAL_COMMUNITY): Payer: Medicare Other | Admitting: Physical Therapy

## 2013-08-10 ENCOUNTER — Inpatient Hospital Stay (HOSPITAL_COMMUNITY): Payer: Medicare Other | Admitting: *Deleted

## 2013-08-10 ENCOUNTER — Inpatient Hospital Stay (HOSPITAL_COMMUNITY): Payer: Medicare Other

## 2013-08-10 DIAGNOSIS — N319 Neuromuscular dysfunction of bladder, unspecified: Secondary | ICD-10-CM

## 2013-08-10 DIAGNOSIS — M4716 Other spondylosis with myelopathy, lumbar region: Secondary | ICD-10-CM

## 2013-08-10 DIAGNOSIS — K592 Neurogenic bowel, not elsewhere classified: Secondary | ICD-10-CM

## 2013-08-10 MED ORDER — DIPHENHYDRAMINE HCL 50 MG/ML IJ SOLN
25.0000 mg | Freq: Four times a day (QID) | INTRAMUSCULAR | Status: AC
  Administered 2013-08-10: 25 mg via INTRAMUSCULAR
  Filled 2013-08-10: qty 0.5

## 2013-08-10 MED ORDER — PREDNISONE 20 MG PO TABS
20.0000 mg | ORAL_TABLET | Freq: Two times a day (BID) | ORAL | Status: DC
  Administered 2013-08-10 – 2013-08-11 (×4): 20 mg via ORAL
  Filled 2013-08-10 (×7): qty 1

## 2013-08-10 MED ORDER — CAMPHOR-MENTHOL 0.5-0.5 % EX LOTN
TOPICAL_LOTION | Freq: Three times a day (TID) | CUTANEOUS | Status: DC
  Administered 2013-08-10 – 2013-08-13 (×10): via TOPICAL
  Administered 2013-08-13: 1 via TOPICAL
  Administered 2013-08-13 – 2013-08-14 (×3): via TOPICAL
  Administered 2013-08-14: 1 via TOPICAL
  Administered 2013-08-15 – 2013-08-19 (×12): via TOPICAL
  Filled 2013-08-10: qty 222

## 2013-08-10 MED ORDER — DIPHENHYDRAMINE HCL 25 MG PO CAPS
25.0000 mg | ORAL_CAPSULE | Freq: Four times a day (QID) | ORAL | Status: AC
  Administered 2013-08-10 – 2013-08-11 (×3): 25 mg via ORAL
  Filled 2013-08-10 (×3): qty 1

## 2013-08-10 MED ORDER — DIPHENHYDRAMINE HCL 25 MG PO CAPS: 25.0000 mg | ORAL_CAPSULE | Freq: Four times a day (QID) | ORAL | Status: DC | PRN

## 2013-08-10 MED ORDER — DIPHENHYDRAMINE HCL 50 MG/ML IJ SOLN
25.0000 mg | Freq: Four times a day (QID) | INTRAMUSCULAR | Status: DC | PRN
Start: 2013-08-10 — End: 2013-08-10

## 2013-08-10 NOTE — Progress Notes (Signed)
Social Work Patient ID: [name], male   DOB: 06/25/1941, 73 y.o.   MRN: [id number]  Reviewed team conference with pt who is aware and agreeable with targeted d/c date of 2/3 and min assist w/c level goals.  Reports feeling better today and decrease in gout pain.  Very pleased to have same treatment team on CIR.  Good family support.  He is hopeful he will be ready to d/c sooner than anticipated.  Following for support and d/c planning needs.  Cyntha Brickman, LCSW

## 2013-08-10 NOTE — Progress Notes (Signed)
Social Work  Social Work Assessment and Plan  Patient Details  Name: [name] MRN: [id number] Date of Birth: [birth year]  Today's Date: 08/10/2013  Problem List:  Patient Active Problem List   Diagnosis Date Noted  . Lumbar degenerative disc disease 08/02/2013  . Post-operative pain 07/29/2013  . Cauda equina syndrome 06/22/2013  . Osteonecrosis 05/05/2013  . Small bowel obstruction 04/17/2013  . Acute renal failure 04/17/2013  . Medial meniscus, posterior horn derangement 02/17/2013  . Knee sprain and strain 02/17/2013  . Trigger point with neck pain 03/18/2012  . Rotator cuff tear arthropathy of right shoulder 09/08/2011  . CAD in native artery 01/09/2011  . Ankle fracture 12/25/2010  . Contusion of shoulder, left 12/25/2010  . PEMPHIGUS VULGARIS 07/02/2010  . MRSA 05/13/2010  . PRURITUS 05/13/2010  . SPINAL STENOSIS, LUMBAR 05/13/2010  . HYPERLIPIDEMIA 04/24/2010  . GASTROESOPHAGEAL REFLUX DISEASE 04/24/2010  . VENTRAL HERNIA, INCISIONAL 04/24/2010  . DEGENERATIVE JOINT DISEASE 04/24/2010  . PYOGENIC ARTHRITIS, SHOULDER REGION 02/04/2010  . HYPERTENSION 01/01/2010  . RUPTURE ROTATOR CUFF 02/19/2009   Past Medical History:  Past Medical History  Diagnosis Date  . Hyperlipidemia   . Small bowel problem     HAD ALOT OF SCAR TISSUE FROM PREVIOUS SURGERIES..NG WAS INSERTED ...Marland KitchenMarland KitchenNO SURGERY NEEDED...Marland KitchenMarland KitchenIN FOR 8 DAYS  . Disorder of blood     BEEN TREATED BY DERMATOLOGIST X 4 YRS..."BLOOD BLISTERS"  . Arthritis   . Gout   . Anxiety   . Hx MRSA infection     rt shoulder  . Pneumonia     "I've had it 3-4 times"  . Coronary artery disease     IN 2000   STENT PLACED IN 2012 sees Dr. Dietrich Pates, saw last 2013  . HTN (hypertension)     sees [name] in [place]  . Small bowel obstruction   . Ventral hernia   . Acute renal failure 04/17/2013  . AVN (avascular necrosis of bone)     bilateral hips   Past Surgical History:  Past Surgical History  Procedure  Laterality Date  . Sternal surg  2000    HAS HAD 5-6 ON HIS STERNUM; "caught MRSA in it"  . Lumbar laminectomy/decompression microdiscectomy  06/13/2011    Procedure: LUMBAR LAMINECTOMY/DECOMPRESSION MICRODISCECTOMY;  Surgeon: Karn Cassis;  Location: [facility];  Service: Neurosurgery;  Laterality: N/A;  Lumbar three, lumbar four-five Laminectomy  . Eye surgery      bilateral cataract  . Anterior cervical corpectomy  12/17/11  . Incision and drainage of wound  ~ 20ll; 12/03/11    "had infection in my right"  . Shoulder open rotator cuff repair  ~ 2011    right  . Peripherally inserted central catheter insertion  2011 & 11/2011  . Cataract extraction w/ intraocular lens  implant, bilateral  ? 2011  . Coronary artery bypass graft  2000    CABG X5  . Back surgery      lumbar  . Tonsillectomy  1949  . Cholecystectomy  2006 "or after"  . Coronary angioplasty with stent placement  2012  . Anterior cervical corpectomy  12/17/2011    Procedure: ANTERIOR CERVICAL CORPECTOMY;  Surgeon: Karn Cassis, MD;  Location: [facility];  Service: Neurosurgery;  Laterality: N/A;  Anterior Cervical Decompression Fusion Five to Thoracic Two with plating  . Lumbar laminectomy/decompression microdiscectomy Right 06/17/2013    Procedure: LUMBAR LAMINECTOMY/DECOMPRESSION MICRODISCECTOMY 1 LEVEL;  Surgeon: Karn Cassis, MD;  Location: [facility];  Service: Neurosurgery;  Laterality: Right;  Right L3-4 Microdiskectomy  . Lumbar wound debridement N/A 08/02/2013    Procedure: INCISION AND DRAINAGE OF LUMBAR WOUND DEBRIDEMENT;  Surgeon: Karn Cassis, MD;  Location: [facility];  Service: Neurosurgery;  Laterality: N/A;   Social History:  reports that he quit smoking about 14 years ago. His smoking use included Cigarettes. He has a 1 pack-year smoking history. He has never used smokeless tobacco. He reports that he does not drink alcohol or use illicit drugs.  Family / Support Systems Marital Status:  Married Patient Roles: Spouse;Parent Spouse/Significant Other: wife, [name] @ [phone] or Harlow Ohms Children: son, Deniece Portela @ [phone]; son, Trey Paula and daughter, Elita Quick - all local and working f/t.  Son, Deniece Portela, a little more available Anticipated Caregiver: wife and family Ability/Limitations of Caregiver: no limitations Caregiver Availability: 24/7 Family Dynamics: pt reports family remains very supportive, however, pt very concerned about the amount of "burden" he is placing on his wife and family.  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 "If I had to come back who else would I want to work with?"  Does admit that he is very frustrated with his new medical issues and need to return to the hospital.  worries that, even with home abx, he might have the "same thing happen again."  He states he is ready to do therapy and feels "motivated" for therapies.  Need to monitor mood and emotional adjustment. Recent Psychosocial Issues: Recent hospitalization with original SCI Pyschiatric History: None Substance Abuse History: None  Patient / Family Perceptions, Expectations & Goals Pt/Family understanding of illness & functional limitations: pt and family with good understanding  of surgery performed and current, resulting functional deficits Premorbid pt/family roles/activities: Pt was completely independent PTA and very active.  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 Anticipated changes in roles/activities/participation: pt's return to independence TBD - this will affect return to his part time work and whether wife will need to assume caregiver responsibilities. Pt/family expectations/goals: Pt hopeful he can make gains just as he did with prior [facility] stay  [employer] Agencies: None Premorbid Home Care/DME Agencies: Other (Comment) ([facility] was following PTA) Transportation available at discharge: yes Resource referrals recommended: Psychology  Discharge Planning Living Arrangements: Spouse/significant other Support Systems: Spouse/significant other;Children;Church/faith community;Friends/neighbors Type of Residence: Private residence Insurance Resources: Administrator (specify) ([employer]) Financial Resources: Social Doctor, hospital Screen Referred: No Living Expenses: Own Money Management: Patient Does the patient have any problems obtaining your medications?: No Home Management: pt and wife Patient/Family Preliminary Plans: pt fully plans to return home with his wife who can provide physical assistance Social Work Anticipated Follow Up Needs: HH/OP Expected length of stay: 2-3 weeks  Clinical Impression Familiar gentleman who returns to [facility] (here 12/14) following treatment for spinal infection.  Very motivated for therapies and happy to  have the same treatment team.  Admits frustration over continued problems but very strong faith that he relies on as well as supportive family.  Will follow for support and d/c planning needs.  Taiyo Kozma 08/10/2013, 3:56 PM

## 2013-08-10 NOTE — Progress Notes (Signed)
Physical Therapy Session Note  Patient Details  Name: [name] MRN: [id number] Date of Birth: 03/13/1941  Today's Date: 08/10/2013 Time: [id number] Time Calculation (min): 44 min  Short Term Goals: Week 1:  PT Short Term Goal 1 (Week 1): Pt will be able to transfer bed <-> w/c with mod A PT Short Term Goal 2 (Week 1): Pt will be able to perform supine <-> sit with min A maintaining back precautions PT Short Term Goal 3 (Week 1): Pt will be able to propel w/c on unit x 100' with S  Skilled Therapeutic Interventions/Progress Updates:    Focused on bed mobility (mod A), transfers (steady A for level surface and heavy min A for uneven surface to Nustep), Nustep for functional strengthening on level 3 x 10 min, and w/c propulsion using LE's for functional strengthening forwards and backwards on unit.   BP was taken after transfer OOB and 128/59 mmHg - pt with no c/o symptoms throughout session.  Therapy Documentation Precautions:  Precautions Precautions: Back;Fall Precaution Comments: Patient able to verbalize 2/3 back precautions Required Braces or Orthoses:  (has abdominal binder longstanding; L AFO) Restrictions Weight Bearing Restrictions: No  Pain: Reports pain is better.  See FIM for current functional status  Therapy/Group: Individual Therapy  Karolee StampsGray, Eli Pattillo [facility] 08/10/2013, 3:53 PM

## 2013-08-10 NOTE — Progress Notes (Signed)
[facility] Individual Statement of Services  Patient Name:  Ignacia MarvelRonald W Philbert  Date:  08/10/2013  Welcome to the [facility].  Our goal is to provide you with an individualized program based on your diagnosis and situation, designed to meet your specific needs.  With this comprehensive rehabilitation program, you will be expected to participate in at least 3 hours of rehabilitation therapies Monday-Friday, with modified therapy programming on the weekends.  Your rehabilitation program will include the following services:  Physical Therapy (PT), Occupational Therapy (OT), 24 hour per day rehabilitation nursing, Therapeutic Recreaction (TR), Neuropsychology, Case Management (Social Worker), Rehabilitation Medicine, Nutrition Services and Pharmacy Services  Weekly team conferences will be held on Tuesdays to discuss your progress.  Your Social Worker will talk with you frequently to get your input and to update you on team discussions.  Team conferences with you and your family in attendance may also be held.  Expected length of stay: 3 weeks  Overall anticipated outcome: minimal assist wheelchair  Depending on your progress and recovery, your program may change. Your Social Worker will coordinate services and will keep you informed of any changes. Your Social Worker's name and contact numbers are listed  below.  The following services may also be recommended but are not provided by the [facility]:   Driving Evaluations  Home Health Rehabiltiation Services  Outpatient Rehabilitation Services    Arrangements will be made to provide these services after discharge if needed.  Arrangements include referral to agencies that provide these services.  Your insurance has been verified to be:  Medicare and [employer] Your primary doctor is:  Dr. Kari BaarsEdward Hawkins  Pertinent information will be shared with your doctor and your insurance  company.  Social Worker:  Reed PointLucy Lanell Carpenter, [place] [phone] or [phone]) [phone]   Information discussed with and copy given to patient by: Amada JupiterHOYLE, Brick Ketcher, 08/10/2013, 4:07 PM

## 2013-08-10 NOTE — Progress Notes (Signed)
Physical Therapy Note  Patient Details  Name: [name] MRN: [id number] Date of Birth: 11/20/1940 Today's Date: 08/10/2013  1100-1155 (55 minutes) individual Pain: 6/10 pain low back/ premedicated Precautions: back (corset on when up) ; Lt AFO in standing Focus of treatment: transfer training; therapeutic exercise focused on bilateral LE AROM / strengthening; wc mobility for activity tolerance Treatment: Pt up in wc upon arrival; wc mobility SBA on unit 120+ feet ; transfers - SBA for wc setup , min to close SBA for scoot transfer (level surfaces) ; sit to sidelying mod assist bilateral LEs ; rolling side to side on mat SBA with vcs for logrolling ; Neuro re-ed  Bilateral LEs - sidelying passive stretch bilateral hip flexors; AA hip flexion/extension 3 X 10 reps in sidelying; supine to side to sit with assist Left LE secondary to increased pain; sit to stand from mat to RW X 3 min/mod assist; pt stands to RW with increased flexion bilateral knees; tolerates standing for < 1 minute before fatigue; returned to room with all needs within reach.    Lovelace Cerveny,JIM 08/10/2013, 11:59 AM

## 2013-08-10 NOTE — Progress Notes (Addendum)
Subjective/Complaints: Feet feel better today. Prednisone very helpful. Slept better. In better spirits as well A 12 point review of systems has been performed and if not noted above is otherwise negative.   Objective: Vital Signs: Blood pressure 154/62, pulse 76, temperature 97.5 F (36.4 C), temperature source Oral, resp. rate 18, height 6' (1.829 m), weight 81.602 kg (179 lb 14.4 oz), SpO2 98.00%. Dg Abd 1 View  08/09/2013   CLINICAL DATA:  Diarrhea.  Rule out obstipation.  EXAM: ABDOMEN - 1 VIEW  COMPARISON:  04/17/2013 abdominal CT  FINDINGS: The bowel gas pattern is normal. No radio-opaque calculi. Lower lumbar fixation and discectomy changes. Cholecystectomy changes. Ventral abdominal wall hernia repair with mesh.  IMPRESSION: Negative for bowel obstruction.   Electronically Signed   By: Tiburcio PeaJonathan  Watts M.D.   On: 08/09/2013 05:36    Recent Labs  08/08/13 1155 08/09/13 0555  WBC 6.4 6.7  HGB 10.1* 9.4*  HCT 29.7* 28.5*  PLT 462* 411*    Recent Labs  08/08/13 1155 08/09/13 0555  NA 138 139  K 4.5 4.6  CL 101 103  GLUCOSE 141* 105*  BUN 10 10  CREATININE 1.02 1.12  CALCIUM 8.9 8.5   CBG (last 3)  No results found for this basename: GLUCAP,  in the last 72 hours  Wt Readings from Last 3 Encounters:  08/08/13 81.602 kg (179 lb 14.4 oz)  07/30/13 81.602 kg (179 lb 14.4 oz)  07/30/13 81.602 kg (179 lb 14.4 oz)    Physical Exam:  Constitutional: He is oriented to person, place, and time. He appears well-developed and well-nourished.  HENT:  Head: Normocephalic and atraumatic.  Right Ear: External ear normal.  Left Ear: External ear normal.  Eyes: Conjunctivae are normal. Pupils are equal, round, and reactive to light.  Neck: Normal range of motion. Neck supple. No JVD present. No tracheal deviation present. No thyromegaly present.  Cardiovascular: Normal rate, regular rhythm and normal heart sounds.  Respiratory: Effort normal and breath sounds normal. No  respiratory distress. He has no wheezes. He has no rales.  GI: Soft. Bowel sounds are normal. He exhibits no distension. There is no tenderness. There is no rebound.  Abdominal wall weakness due to prior surgery.  Musculoskeletal: He exhibits tenderness (BLE shin/ankle with areas of dysesthesias. ).  Right forearm with reddened and edematous area--prior site of IV infiltration? Bilateral feet less tender. No obvious edema, redness, joint inflammation Lymphadenopathy:  He has no cervical adenopathy.  Neurological: He is alert and oriented to person, place, and time.  4+/5 strength bilateral UE's proximal to distal. Normal sensation in bilateral upper extremities Absent sensation left foot but intact above the ankle Left lower extremity strength 2+ to 3 minus hip flexion, 3+ knee extension 1/5 ankle plantarflexion 0/5 ankle dorsiflexion Near normal sensation right foot and right lower extremity Right lower extremity strength is 3/5 hip flexion knee extension, 2+ to 3 minus ankle dorsiflexion and plantarflexion  Skin: Skin is warm Left glutea/inguinal areas with improving macular rash. No swelling. Back incision clean and intact Psychiatric: His speech is normal. Judgment and thought content normal. His mood appears anxious. He is withdrawn. Cognition and memory are normal.    Assessment/Plan: 1. Functional deficits secondary to cauda equina, wound infection s/p I and D which require 3+ hours per day of interdisciplinary therapy in a comprehensive inpatient rehab setting. Physiatrist is providing close team supervision and 24 hour management of active medical problems listed below. Physiatrist and rehab team continue to  assess barriers to discharge/monitor patient progress toward functional and medical goals. FIM: FIM - Bathing Bathing: 0: Activity did not occur  FIM - Upper Body Dressing/Undressing Upper body dressing/undressing steps patient completed: Thread/unthread right sleeve of  pullover shirt/dresss;Thread/unthread left sleeve of pullover shirt/dress;Pull shirt over trunk Upper body dressing/undressing: 4: Min-Patient completed 75 plus % of tasks FIM - Lower Body Dressing/Undressing Lower body dressing/undressing: 1: Total-Patient completed less than 25% of tasks  FIM - Toileting Toileting: 0: Activity did not occur  FIM - Archivist Transfers: 0-Activity did not occur  FIM - Banker Devices: Arm rests Bed/Chair Transfer: 2: Chair or W/C > Bed: Max A (lift and lower assist);3: Sit > Supine: Mod A (lifting assist/Pt. 50-74%/lift 2 legs)  FIM - Locomotion: Ambulation Locomotion: Ambulation: 0: Activity did not occur (unsafe to attempt at eval)  Comprehension Comprehension Mode: Auditory Comprehension: 7-Follows complex conversation/direction: With no assist  Expression Expression Mode: Verbal Expression: 7-Expresses complex ideas: With no assist  Social Interaction Social Interaction: 7-Interacts appropriately with others - No medications needed.  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  1. DVT Prophylaxis/Anticoagulation: Pharmaceutical: Lovenox  2. Pain Management: Continue neurontin for dysesthesias- .   -added low dose TCA at night  -prednisone taper for gout flare 3. Mood: Team to provide ego support for elevated levels of anxiety. :CSW to follow for evaluation and support.  4. Neuropsych: This patient is capable of making decisions on his own behalf.  5. Neurogenic bladder: Continue Flomax, Cardura and urecholine.  -sounds as if he was having some kickoff prior to coming into the hospital.  -will likely dc foley tomorrow and resume I and O caths.  6. Neurogenic bowel:  re-establishing bowel  program. Suppository daily in am.  7. Anemia: hgb 9.4---track serially 8. Skin: Candidal irritation perineal area as well as MAST left buttock due to bowel incontince  -continue local care, keep skin dry, clean, turn 9. ID: continue vanc/ctx  LOS (Days) 2 A FACE TO FACE EVALUATION WAS PERFORMED  SWARTZ,ZACHARY T 08/10/2013 8:07 AM

## 2013-08-10 NOTE — Progress Notes (Signed)
Recreational Therapy Assessment and Plan  Patient Details  Name: [name] MRN: [id number] Date of Birth: [birth year] Today's Date: 08/10/2013  Rehab Potential: Good ELOS: 2 weeks   Assessment   Clinical Impression: Problem List:  Patient Active Problem List    Diagnosis  Date Noted   .  Lumbar degenerative disc disease  08/02/2013   .  Post-operative pain  07/29/2013   .  Cauda equina syndrome  06/22/2013   .  Osteonecrosis  05/05/2013   .  Small bowel obstruction  04/17/2013   .  Acute renal failure  04/17/2013   .  Medial meniscus, posterior horn derangement  02/17/2013   .  Knee sprain and strain  02/17/2013   .  Trigger point with neck pain  03/18/2012   .  Rotator cuff tear arthropathy of right shoulder  09/08/2011   .  CAD in native artery  01/09/2011   .  Ankle fracture  12/25/2010   .  Contusion of shoulder, left  12/25/2010   .  PEMPHIGUS VULGARIS  07/02/2010   .  MRSA  05/13/2010   .  PRURITUS  05/13/2010   .  SPINAL STENOSIS, LUMBAR  05/13/2010   .  HYPERLIPIDEMIA  04/24/2010   .  GASTROESOPHAGEAL REFLUX DISEASE  04/24/2010   .  VENTRAL HERNIA, INCISIONAL  04/24/2010   .  DEGENERATIVE JOINT DISEASE  04/24/2010   .  PYOGENIC ARTHRITIS, SHOULDER REGION  02/04/2010   .  HYPERTENSION  01/01/2010   .  RUPTURE ROTATOR CUFF  02/19/2009    Past Medical History:  Past Medical History   Diagnosis  Date   .  Hyperlipidemia    .  Small bowel problem      HAD ALOT OF SCAR TISSUE FROM PREVIOUS SURGERIES..NG WAS INSERTED ...Marland KitchenMarland KitchenNO SURGERY NEEDED...Marland KitchenMarland KitchenIN FOR 8 DAYS   .  Disorder of blood      BEEN TREATED BY DERMATOLOGIST X 4 YRS..."BLOOD BLISTERS"   .  Arthritis    .  Gout    .  Anxiety    .  Hx MRSA infection      rt shoulder   .  Pneumonia      "I've had it 3-4 times"   .  Coronary artery disease      IN 2000 STENT PLACED IN 2012 sees Dr. Lattie Haw, saw last 2013   .  HTN (hypertension)      sees Dr. [name] in [place]   .  Small bowel obstruction    .   Ventral hernia    .  Acute renal failure  04/17/2013   .  AVN (avascular necrosis of bone)      bilateral hips    Past Surgical History:  Past Surgical History   Procedure  Laterality  Date   .  Sternal surg   2000     HAS HAD 5-6 ON HIS STERNUM; "caught MRSA in it"   .  Lumbar laminectomy/decompression microdiscectomy   06/13/2011     Procedure: LUMBAR LAMINECTOMY/DECOMPRESSION MICRODISCECTOMY; Surgeon: Floyce Stakes; Location: [facility]; Service: Neurosurgery; Laterality: N/A; Lumbar three, lumbar four-five Laminectomy   .  Eye surgery       bilateral cataract   .  Anterior cervical corpectomy   12/17/11   .  Incision and drainage of wound   ~ 20ll; 12/03/11     "had infection in my right"   .  Shoulder open rotator cuff repair   ~ 2011  right   .  Peripherally inserted central catheter insertion   2011 & 11/2011   .  Cataract extraction w/ intraocular lens implant, bilateral   ? 2011   .  Coronary artery bypass graft   2000     CABG X5   .  Back surgery       lumbar   .  Tonsillectomy   1949   .  Cholecystectomy   2006 "or after"   .  Coronary angioplasty with stent placement   2012   .  Anterior cervical corpectomy   12/17/2011     Procedure: ANTERIOR CERVICAL CORPECTOMY; Surgeon: Floyce Stakes, MD; Location: [facility]; Service: Neurosurgery; Laterality: N/A; Anterior Cervical Decompression Fusion Five to Thoracic Two with plating   .  Lumbar laminectomy/decompression microdiscectomy  Right  06/17/2013     Procedure: LUMBAR LAMINECTOMY/DECOMPRESSION MICRODISCECTOMY 1 LEVEL; Surgeon: Floyce Stakes, MD; Location: [facility]; Service: Neurosurgery; Laterality: Right; Right L3-4 Microdiskectomy   .  Lumbar wound debridement  N/A  08/02/2013     Procedure: INCISION AND DRAINAGE OF LUMBAR WOUND DEBRIDEMENT; Surgeon: Floyce Stakes, MD; Location: [facility]; Service: Neurosurgery; Laterality: N/A;    Assessment & Plan  Clinical Impression: [name] is a 73  y.o. right-handed male well known to rehabilitation services from recent stay (11/26--07/08/2013) for lumbar L4-S1 decompression complicated by CSF leak and cauda equina syndrome. He was readmitted on 07/30/2013 with progressive low back pain X 3-4 days. MRI of the lumbar spine showed epidural fluid collection and some ring-enhancing fluid collections in the soft tissues. He was placed on broad-spectrum antibiotics and ID consulted for input. They recommended fluid cultures as well as continuing broad spectrum antibiotics. Patient underwent exploration of lumbar wound with removal of fibrosis and decompression of the thecal sac and the right side as well as lumbar L3 and L5 nerve root 08/03/2013 per Dr. Joya Salm. Blood cultures, wound cultures negative but patient received antibiotics at home prior to admission and ID recommends continuing antibiotics--final recommendations pending. Patient with LLE pain with edema as well as pain/difficulty with weight bearing. LLE dopplers negative for DVT. Ortho was consulted due to left shoulder pain. Shoulder aspiration and patient treated with steroids for L-RTC tear with exacerbated symptoms. He continues with bowel incontinence due to cauda equina syndrome as well as drainage from incision. CIR recommended by therapy team and patient admitted today. Patient transferred to CIR on 08/08/2013.  Familiar with pt from previous admission to CIR ~ 71 month ago.  Pt presents with decreased activity tolerance, decreased functional mobility, decreased balance, & paralysis limiting pt's independence with leisure/community pursuits. Plan Rec Therapy Plan Is patient appropriate for Therapeutic Recreation?: Yes Rehab Potential: Good Treatment times per week: Min 1 time per weeek>20 minutes Estimated Length of Stay: 2 weeks TR Treatment/Interventions: Adaptive equipment instruction;1:1 session;Balance/vestibular training;Functional mobility training;Community  reintegration;Patient/family education;Recreation/leisure participation;Therapeutic activities;UE/LE Coordination activities;Therapeutic exercise;Wheelchair propulsion/positioning Recommendations for other services: Neuropsych  Recommendations for other services: Neuropsych  Discharge Criteria: Patient will be discharged from TR if patient refuses treatment 3 consecutive times without medical reason.  If treatment goals not met, if there is a change in medical status, if patient makes no progress towards goals or if patient is discharged from hospital.  The above assessment, treatment plan, treatment alternatives and goals were discussed and mutually agreed upon: by patient  [place] 08/10/2013, 9:54 AM

## 2013-08-10 NOTE — Progress Notes (Signed)
Occupational Therapy Session Notes  Patient Details  Name: Jake MarvelRonald W Loeber MRN: [id number] Date of Birth: 01/07/1941  Today's Date: 08/10/2013  Short Term Goals: Week 1:  OT Short Term Goal 1 (Week 1): Patient will perform UB dressing with supervision while seated EOB OT Short Term Goal 2 (Week 1): Patient will complete LB dressing with moderate assistance while seated EOB (excluding shoes, socks only) OT Short Term Goal 3 (Week 1): Patient will complete toilet transfer with minimal assistance OT Short Term Goal 4 (Week 1): Patient will complete sit<>stand with moderate assistance using AD prn  Skilled Therapeutic Interventions/Progress Updates:   Session #1 [phone] - 60 Minutes Individual Therapy Patient with 5/10 complaints of pain; patient stated he was medicated earlier this am Upon entering room, patient found supine in bed. Patient engaged in bed mobility for therapist to assist with brief change and peri care. During this, therapist noticed rash, dry skin, and redness; notified RN of this. Therapist donned bilateral TEDs and socks, then patient performed side lying > sit edge of bed with moderate assistance. Patient sat edge of bed for breakfast for focus on dynamic sitting balance/tolerabnce/endurance. After breakfast, patient donned shorts seated EOB with maximal assist (lateral leans) from therapist. After donning of pants, patient transferred edge of bed > w/c with minimal assistance (scooting technique). Patient then sat at sink to complete grooming tasks and UB bathing & dressing. Patient's IV on left forearm came out during session, notified RN of this. At end of session, left patient seated in w/c at sink for next therapy session, all needed items within reach.   Session #2 [id number] - 30 Minutes Individual Therapy No complaints of pain Upon entering room, patient found seated in w/c. Patient propelled self > dayroom (propelling with BUEs and holding BLEs off floor).  Therapist assisted with set-up of STEDY machine and patient stood with STEDY and moderate assistance (gain belt donned). Patient able to stand X2 statically for 30 seconds first time and 55 seconds second time. Therapist then engaged patient in dynamic standing task, patient able to stand ~45 seconds. Patient needed to sit back in w/c and complained of light headedness; BP=135/48. Therapist recommended patient get back to bed prior to next therapy session, patient had been up all day (since ~8am). Therapist assisted patient back to bed, patient performed squat pivot transfer with close supervision. Left patient supine in bed with all needed items within reach.   Precautions:  Precautions Precautions: Back;Fall Precaution Comments: Patient able to verbalize 2/3 back precautions Required Braces or Orthoses:  (has abdominal binder longstanding; L AFO) Restrictions Weight Bearing Restrictions: No  See FIM for current functional status  Zauria Dombek 08/10/2013, 7:23 AM

## 2013-08-10 NOTE — Progress Notes (Signed)
Social Work Patient ID: [name], male   DOB: 06/21/1941, 73 y.o.   MRN: [id number]  COPY**  Amada Jupiter, LCSW Social Worker Signed  Patient Care Conference Service date: 08/09/2013 6:00 PM  Inpatient RehabilitationTeam Conference and Plan of Care Update Date: 08/09/2013   Time: 2:05 PM     Patient Name: [name]       Medical Record Number: [id number]   Date of Birth: [birth year] Sex: Male         Room/Bed: 4W17C/4W17C-01 Payor Info: Payor: MEDICARE / Plan: MEDICARE PART A AND B / Product Type: *No Product type* /   Admitting Diagnosis: Cauda Equine   Admit Date/Time:  08/08/2013  6:12 PM Admission Comments: No comment available   Primary Diagnosis:  <principal problem not specified> Principal Problem: <principal problem not specified>    Patient Active Problem List     Diagnosis  Date Noted   .  Lumbar degenerative disc disease  08/02/2013   .  Post-operative pain  07/29/2013   .  Cauda equina syndrome  06/22/2013   .  Osteonecrosis  05/05/2013   .  Small bowel obstruction  04/17/2013   .  Acute renal failure  04/17/2013   .  Medial meniscus, posterior horn derangement  02/17/2013   .  Knee sprain and strain  02/17/2013   .  Trigger point with neck pain  03/18/2012   .  Rotator cuff tear arthropathy of right shoulder  09/08/2011   .  CAD in native artery  01/09/2011   .  Ankle fracture  12/25/2010   .  Contusion of shoulder, left  12/25/2010   .  PEMPHIGUS VULGARIS  07/02/2010   .  MRSA  05/13/2010   .  PRURITUS  05/13/2010   .  SPINAL STENOSIS, LUMBAR  05/13/2010   .  HYPERLIPIDEMIA  04/24/2010   .  GASTROESOPHAGEAL REFLUX DISEASE  04/24/2010   .  VENTRAL HERNIA, INCISIONAL  04/24/2010   .  DEGENERATIVE JOINT DISEASE  04/24/2010   .  PYOGENIC ARTHRITIS, SHOULDER REGION  02/04/2010   .  HYPERTENSION  01/01/2010   .  RUPTURE ROTATOR CUFF  02/19/2009     Expected Discharge Date: Expected Discharge Date: 08/30/13  Team Members Present: Physician leading  conference: Dr. Faith Rogue Social Worker Present: Amada Jupiter, LCSW Nurse Present: Leland Johns, RN PT Present: Karolee Stamps, Jerrye Bushy, PT OT Present: Edwin Cap, Loistine Chance, OT PPS Coordinator present : Tora Duck, RN, CRRN        Current Status/Progress  Goal  Weekly Team Focus   Medical     cauda equina, pain issues related to gout. wound i and d, abx rx  improve activity tolerance to allow participation in therapy.,   rx gout, pain, mood rx and support   Bowel/Bladder     LBM 08/08/13.  Foley catheter intact due to acute retention.  Foley catheter maintainance.  Monitor BM pattern.  Medicate prn     Swallow/Nutrition/ Hydration            ADL's     Min for UB ADLs, total for LB ADLs, Mod assist for transfers (scoot)  overall mod I at w/c level  ADL retraining from w/c level, functional transfers, pain management, UB strengthening, overall activity tolerance/endurance, functional mobility   Mobility     mod to max A overall  min A transfers, mod I w/c mobility  neuro re-ed for balance and mobility, functional strengthening, endurance, pain management,  activity tolerance   Communication            Safety/Cognition/ Behavioral Observations    Alarm in bed/chair.  2+ max assist with transfers.  Min assist with transfers.  Monitor for unsafe behavior; keep free from injury.   Pain     PRN: Robaxin 1000 mg IV q 6 hrs for muscle spasms; Oxy IR 10 mg q 4 hrs pain; Ultram 50 mg q 6 hrs pain  Pain level of <3  Monitor for pain q shift and prn; medicate appropriately   Skin     large amt bruising BUE; steri strip intact to low back incision; rash to rt forearm, scrotum, bil groin  no additional skin issues  Monitor for signs of infection; continue Nystatin for rashes    Rehab Goals Patient on target to meet rehab goals: Yes *See Care Plan and progress notes for long and short-term goals.    Barriers to Discharge:  pain control, neurological deficits, mood       Possible Resolutions to Barriers:    medical mgt, pain control, egosupport      Discharge Planning/Teaching Needs:    home with wife and family who can provide assistance      Team Discussion:    New eval for this gentleman who is familiar to CIR (here 12/14).  Not feeling well on this initial eval day - clearly wanted to participate, however, just could not tolerate much.  Mood is more depressed than previous stay.  Hoping to reach min assist w/c goals if possible.   Revisions to Treatment Plan:    None    Continued Need for Acute Rehabilitation Level of Care: The patient requires daily medical management by a physician with specialized training in physical medicine and rehabilitation for the following conditions: Daily direction of a multidisciplinary physical rehabilitation program to ensure safe treatment while eliciting the highest outcome that is of practical value to the patient.: Yes Daily medical management of patient stability for increased activity during participation in an intensive rehabilitation regime.: Yes Daily analysis of laboratory values and/or radiology reports with any subsequent need for medication adjustment of medical intervention for : Post surgical problems;Neurological problems;Other  [name] 08/09/2013, 6:01 PM

## 2013-08-11 ENCOUNTER — Inpatient Hospital Stay (HOSPITAL_COMMUNITY): Payer: Medicare Other | Admitting: Occupational Therapy

## 2013-08-11 ENCOUNTER — Inpatient Hospital Stay (HOSPITAL_COMMUNITY): Payer: Medicare Other

## 2013-08-11 ENCOUNTER — Inpatient Hospital Stay (HOSPITAL_COMMUNITY): Payer: Medicare Other | Admitting: Physical Therapy

## 2013-08-11 ENCOUNTER — Ambulatory Visit (HOSPITAL_COMMUNITY): Payer: Medicare Other | Admitting: *Deleted

## 2013-08-11 DIAGNOSIS — G834 Cauda equina syndrome: Secondary | ICD-10-CM

## 2013-08-11 LAB — CULTURE, BLOOD (ROUTINE X 2)
CULTURE: NO GROWTH
Culture: NO GROWTH

## 2013-08-11 MED ORDER — DOXAZOSIN MESYLATE 4 MG PO TABS
4.0000 mg | ORAL_TABLET | Freq: Every day | ORAL | Status: DC
  Administered 2013-08-11 – 2013-08-18 (×7): 4 mg via ORAL
  Filled 2013-08-11 (×8): qty 1

## 2013-08-11 MED ORDER — BETHANECHOL CHLORIDE 25 MG PO TABS
50.0000 mg | ORAL_TABLET | Freq: Three times a day (TID) | ORAL | Status: DC
  Administered 2013-08-11 – 2013-08-17 (×20): 50 mg via ORAL
  Filled 2013-08-11 (×24): qty 2

## 2013-08-11 NOTE — IPOC Note (Signed)
Overall Plan of Care [facility](IPOC) Patient Details Name: [name] MRN: [id number] DOB: 11/23/1940  Admitting Diagnosis: Cauda Equine  Hospital Problems: Active Problems:   Cauda equina syndrome     Functional Problem List: Nursing Behavior;Bladder;Bowel;Edema;Medication Management;Sensory;Safety;Skin Integrity;Pain  PT Balance;Edema;Endurance;Motor;Pain;Sensory;Skin Integrity  OT Balance;Edema;Endurance;Motor;Nutrition;Pain;Perception;Safety;Sensory;Skin Integrity  SLP    TR         Basic ADL's: OT Grooming;Bathing;Dressing;Toileting     Advanced  ADL's: OT Light Housekeeping     Transfers: PT Bed Mobility;Bed to Chair;Car;Furniture  OT Toilet;Tub/Shower     Locomotion: Education officer, communityT Wheelchair Mobility;Ambulation;Stairs     Additional Impairments: OT Fuctional Use of Upper Extremity  SLP        TR      Anticipated Outcomes Item Anticipated Outcome  Self Feeding independent (current level)  Swallowing      Basic self-care  mod I w/c level  Toileting  mod I w/c level   Bathroom Transfers mod I w/c level  Bowel/Bladder  cont bowel bladder  Transfers  min A w/c level  Locomotion  mod I w/c mobility  Communication     Cognition     Pain  less than 3  Safety/Judgment  adhere to safety protocol   Therapy Plan: PT Intensity: Minimum of 1-2 x/day ,45 to 90 minutes PT Frequency: 5 out of 7 days PT Duration Estimated Length of Stay: 3 weeks OT Intensity: Minimum of 1-2 x/day, 45 to 90 minutes OT Frequency: 5 out of 7 days OT Duration/Estimated Length of Stay: ~3 weeks         Team Interventions: Nursing Interventions Patient/Family Education;Bowel Management;Bladder Management;Disease Management/Prevention;Pain Management;Discharge Planning;Psychosocial Support  PT interventions Ambulation/gait training;Balance/vestibular training;Community reintegration;Discharge planning;Disease management/prevention;DME/adaptive equipment instruction;Functional mobility  training;Neuromuscular re-education;Pain management;Patient/family education;Psychosocial support;Skin care/wound management;Splinting/orthotics;Stair training;Therapeutic Activities;Therapeutic Exercise;UE/LE Strength taining/ROM;UE/LE Coordination activities;Wheelchair propulsion/positioning  OT Interventions Balance/vestibular training;Community reintegration;Discharge planning;DME/adaptive equipment instruction;Functional mobility training;Neuromuscular re-education;Pain management;Patient/family education;Psychosocial support;Self Care/advanced ADL retraining;Skin care/wound managment;Splinting/orthotics;Therapeutic Exercise;Therapeutic Activities;UE/LE Strength taining/ROM;Wheelchair propulsion/positioning;UE/LE Coordination activities  SLP Interventions    TR Interventions Adaptive equipment instruction;1:1 session;Balance/vestibular training;Functional mobility training;Community reintegration;Patient/family education;Recreation/leisure participation;Therapeutic activities;UE/LE Coordination activities;Therapeutic exercise;Wheelchair propulsion/positioning  SW/CM Interventions Discharge Planning;Psychosocial Support;Patient/Family Education    Team Discharge Planning: Destination: PT-Home ,OT- Home , SLP-  Projected Follow-up: PT-Home health PT, OT-  Home health OT, SLP-  Projected Equipment Needs: PT-None recommended by PT (pt received w/c from prior CIR admission), OT- None recommended by OT, SLP-  Equipment Details: PT- , OT-Patient currently has a BSC and tub transfer bench Patient/family involved in discharge planning: PT- Patient,  OT-Patient, SLP-   MD ELOS: 20 days Medical Rehab Prognosis:  Excellent Assessment: The patient has been admitted for CIR therapies. The team will be addressing, functional mobility, strength, stamina, balance, safety, adaptive techniques/equipment, self-care, bowel and bladder mgt, patient and caregiver education, I and O caths, NMR, wound cared, egosupport.  Goals have been set at mod I for basic self-care and w/c mobility, supervision to min assist for transfers. Pt is motivated to regain independence.    Ranelle OysterZachary T. Swartz, MD, FAAPMR      See Team Conference Notes for weekly updates to the plan of care

## 2013-08-11 NOTE — Progress Notes (Signed)
Physical Therapy Note  Patient Details  Name: [name] MRN: [id number] Date of Birth: 10/30/1940 Today's Date: 08/11/2013  Time: 1030-1100 30 minutes  1:1 No c/o pain.  Pt performed transfers to nustep and bed with min A, cues for safety with set up.  Nu step x 7 minutes, 5 minutes level 3 for UE/LE strength and endurance.  Transfers and bed mobility min-mod A, cues for set up.  Pt with good motivation to work hard and improve.   [name] 08/11/2013, 11:03 AM

## 2013-08-11 NOTE — Progress Notes (Signed)
Occupational Therapy Session Notes  Patient Details  Name: Jake MarvelRonald W Blackburn MRN: [id number] Date of Birth: 01/06/1941  Today's Date: 08/11/2013  Short Term Goals: Week 1:  OT Short Term Goal 1 (Week 1): Patient will perform UB dressing with supervision while seated EOB OT Short Term Goal 2 (Week 1): Patient will complete LB dressing with moderate assistance while seated EOB (excluding shoes, socks only) OT Short Term Goal 3 (Week 1): Patient will complete toilet transfer with minimal assistance OT Short Term Goal 4 (Week 1): Patient will complete sit<>stand with moderate assistance using AD prn  Skilled Therapeutic Interventions/Progress Updates:   Session #1 [id number] - 60 Minutes Individual Therapy Patient with 7/10 complaints of pain; patient stated he had pain medication at 6:30am Upon entering room, patient found supine in bed. Patient engaged in bed mobility and sat edge of bed with supervision, using bed rails. From here, patient worked on LB dressing focusing on stretching > BLEs, dynamic sitting, and adhering to back precautions. Patient stood using STEDY in order to pull pants up to waist. Patient with incontinent BM during standing, therapist assisted patient > bathroom while patient in STEDY and patient transferred onto elevated toilet seat. Patient required total assist for toileting needs (hygiene and clothing management). Patient transferred > w/c after toileting needs and sat at sink for UB bathing/dressing and grooming tasks. Patient with increased anxiety this am/increased tremors with static and dynamic movements. Patient propelled self from room > therapy gym at mod I level and engaged in BUE ROM/strengthening exercises using ergometer for ~10 minutes (frontward and backward movements). Patient left in gym at end of session with PT present for next session. [facility] or [facility] toilet seat for bowel program in the am with nursing staff, can use  STEDY or have patient perform scoot/squat pivot transfer; notified patient and RN of this recommendation.   Session #2 [id number] - 45 Minutes Co-Treatment with TR No complaints of pain  Upon entering room, patient found supine in bed with wife present. Patient engaged in bed mobility with HOB down with supervision; managing BLEs. Patient then performed side scoots and transferred edge of bed > w/c with supervision. Patient propelled self from room > therapy gym for therapeutic exercise focusing on sit<>stands using RW, static & dynamic standing balance/tolerance/endurance, and overall activity tolerance/endurance. Patient and wife concerned about edema near incision on back, notified RN and PA of this. At end of session, left patient with wife to propel around unit.   Precautions:  Precautions Precautions: Back;Fall Precaution Comments: Patient able to verbalize 2/3 back precautions Required Braces or Orthoses:  (has abdominal binder longstanding; L AFO) Restrictions Weight Bearing Restrictions: No  See FIM for current functional status  Jada Kuhnert 08/11/2013, 7:25 AM

## 2013-08-11 NOTE — Progress Notes (Signed)
Patient ID: [name], male   DOB: [birth year], 73 y.o.   MRN: [id number] i met mr [name] wife and son. We talked about his care , prognosis and tests. Post op [name] was having a lot of pain with dense weakness of the left leg associated with swelling. Standard medication for gout did not help and he was re-started on prednisone. Since then his left strenght is getting better up to the point he is sitting in a wheel-chair. Still he has weakness of df and pf of the left foot . In relation to gi he feels when he gets a rectal examination by the nurses. Mentally he is more awake and aware. Sensory shows decrease in left foot up to the point that he is able to use the splint.  ALL the cultures are negative BUT he got abs prior to surgery. See ID note. The weakness  of the left leg can go along with the cauda equina syndrome but im concern about myopathy?neuropathy. I will try to see if we can get a emg/ncv of both legs but i need to find out if the test is given at [facility] or in an out patient facility. He is due to have a bone density study to be sure we are not dealing with osteoporosis/osteopenia  Secondary to the chronic intake of steroids. The family is also concern about the amount of pain medications he is taking.i will talk with rehabilitation in am

## 2013-08-11 NOTE — Progress Notes (Signed)
Physical Therapy Session Note  Patient Details  Name: [name] MRN: [id number] Date of Birth: 09/02/1940  Today's Date: 08/11/2013 Time: 0827-0926 Time Calculation (min): 59 min  Short Term Goals: Week 1:  PT Short Term Goal 1 (Week 1): Pt will be able to transfer bed <-> w/c with mod A PT Short Term Goal 2 (Week 1): Pt will be able to perform supine <-> sit with min A maintaining back precautions PT Short Term Goal 3 (Week 1): Pt will be able to propel w/c on unit x 100' with S  Skilled Therapeutic Interventions/Progress Updates:    Transferred to and from mat scoot pivot with S level surface and pt managing w/c parts independently. Pt with reports of feeling a little "woozy", BP taken = 105/53 mmHg. Deferred standing activities at this time. Seated LE therex for functional strengthening with 2# ankle weight on R and 1 # on LLE: LAQ, seated marches, and isometric 5 sec hold hip abduction and adduction x 15 reps each x 2 sets. Sit to stands from slightly elevated mat with RW with overall min A; cues for posture and hand placement. Limited tolerance due to pain and fatigue but able to maintain up to 30 seconds. Pre-gait activities initiated to step forward and backwards but causes increased pain in low back and pt unable to tolerate but 2 attempts. Able to maintain balance a few seconds while taking one hand off of walker at a time. Supine stretching to hip flexors x 3 reps x 30 second hold each side.  Therapy Documentation Precautions:  Precautions Precautions: Back;Fall Precaution Comments: Patient able to verbalize 2/3 back precautions Required Braces or Orthoses:  (has abdominal binder longstanding; L AFO) Restrictions Weight Bearing Restrictions: No  Pain: Premedicated - reports pain in back but feet are feeling better today.  See FIM for current functional status  Therapy/Group: Individual Therapy  Karolee StampsGray, Kyilee Gregg [facility] 08/11/2013, 9:28 AM

## 2013-08-11 NOTE — Progress Notes (Signed)
Recreational Therapy Session Note  Patient Details  Name: [name] MRN: [id number] Date of Birth: 11/23/1940 Today's Date: 08/11/2013  Pain: no c/o Skilled Therapeutic Interventions/Progress Updates:Session focused on activity tolerance, w/c mobility, standing balance & addressing concerns of both the pt & his wife.  Pt propelled w/c with supervision to gym.  Attempted standing to play wii bowling, but pt unable to tolerate standing to play.  Pt performed multiple sit-stands with RW  With mod Assist.  RN & PA aware of pt/wife's concerns and addressing.  Therapy/Group: Co-Treatment  Reilley Latorre 08/11/2013, 4:10 PM

## 2013-08-11 NOTE — Progress Notes (Signed)
Subjective/Complaints: Feeling better overall. Pain improved. In better spirits overall. A 12 point review of systems has been performed and if not noted above is otherwise negative.   Objective: Vital Signs: Blood pressure 153/65, pulse 69, temperature 97.5 F (36.4 C), temperature source Oral, resp. rate 18, height 6' (1.829 m), weight 81.602 kg (179 lb 14.4 oz), SpO2 98.00%. No results found.  Recent Labs  08/08/13 1155 08/09/13 0555  WBC 6.4 6.7  HGB 10.1* 9.4*  HCT 29.7* 28.5*  PLT 462* 411*    Recent Labs  08/08/13 1155 08/09/13 0555  NA 138 139  K 4.5 4.6  CL 101 103  GLUCOSE 141* 105*  BUN 10 10  CREATININE 1.02 1.12  CALCIUM 8.9 8.5   CBG (last 3)  No results found for this basename: GLUCAP,  in the last 72 hours  Wt Readings from Last 3 Encounters:  08/08/13 81.602 kg (179 lb 14.4 oz)  07/30/13 81.602 kg (179 lb 14.4 oz)  07/30/13 81.602 kg (179 lb 14.4 oz)    Physical Exam:  Constitutional: He is oriented to person, place, and time. He appears well-developed and well-nourished.  HENT:  Head: Normocephalic and atraumatic.  Right Ear: External ear normal.  Left Ear: External ear normal.  Eyes: Conjunctivae are normal. Pupils are equal, round, and reactive to light.  Neck: Normal range of motion. Neck supple. No JVD present. No tracheal deviation present. No thyromegaly present.  Cardiovascular: Normal rate, regular rhythm and normal heart sounds.  Respiratory: Effort normal and breath sounds normal. No respiratory distress. He has no wheezes. He has no rales.  GI: Soft. Bowel sounds are normal. He exhibits no distension. There is no tenderness. There is no rebound.  Abdominal wall weakness due to prior surgery.  Musculoskeletal: He exhibits tenderness (BLE shin/ankle with areas of dysesthesias. ).  Right forearm with reddened and edematous area--prior site of IV infiltration? Bilateral feet less tender. No obvious edema, redness, joint  inflammation Lymphadenopathy:  He has no cervical adenopathy.  Neurological: He is alert and oriented to person, place, and time.  4+/5 strength bilateral UE's proximal to distal. Normal sensation in bilateral upper extremities Absent sensation left foot but intact above the ankle Left lower extremity strength 2+ to 3 minus hip flexion, 3+ knee extension 1/5 ankle plantarflexion 0/5 ankle dorsiflexion Near normal sensation right foot and right lower extremity Right lower extremity strength is 3/5 hip flexion knee extension, 2+ to 3 minus ankle dorsiflexion and plantarflexion  Skin: Skin is warm Left glutea/inguinal areas with improving macular rash. No swelling. Back incision clean and intact Psychiatric: His speech is normal. Judgment and thought content normal. His mood appears anxious. He is withdrawn. Cognition and memory are normal.    Assessment/Plan: 1. Functional deficits secondary to cauda equina, wound infection s/p I and D which require 3+ hours per day of interdisciplinary therapy in a comprehensive inpatient rehab setting. Physiatrist is providing close team supervision and 24 hour management of active medical problems listed below. Physiatrist and rehab team continue to assess barriers to discharge/monitor patient progress toward functional and medical goals.   FIM: FIM - Bathing Bathing Steps Patient Completed: Chest;Right Arm;Left Arm;Abdomen Bathing: 2: Max-Patient completes 3-4 36f 10 parts or 25-49%  FIM - Upper Body Dressing/Undressing Upper body dressing/undressing steps patient completed: Thread/unthread right sleeve of pullover shirt/dresss;Thread/unthread left sleeve of pullover shirt/dress;Pull shirt over trunk;Put head through opening of pull over shirt/dress Upper body dressing/undressing: 5: Set-up assist to: Obtain clothing/put away FIM - Lower  Body Dressing/Undressing Lower body dressing/undressing steps patient completed: Thread/unthread left pants  leg Lower body dressing/undressing: 1: Total-Patient completed less than 25% of tasks (STEDY to stand)  FIM - Hotel managerToileting Toileting Assistive Devices: Grab bar or rail for support Toileting: 1: Total-Patient completed zero steps, helper did all 3  FIM - Diplomatic Services operational officerToilet Transfers Toilet Transfers Assistive Devices: Elevated toilet seat;Grab bars Toilet Transfers: 2-From toilet/BSC: Max A (lift and lower assist);2-To toilet/BSC: Max A (lift and lower assist) (STEDY for transfer)  FIM - Bed/Chair Transfer Bed/Chair Transfer Assistive Devices: HOB elevated;Arm rests;Bed rails Bed/Chair Transfer: 5: Supine > Sit: Supervision (verbal cues/safety issues);3: Bed > Chair or W/C: Mod A (lift or lower assist) (STEDY for OOB transfer)  FIM - Locomotion: Wheelchair Locomotion: Wheelchair: 5: Travels 150 ft or more: maneuvers on rugs and over door sills with supervision, cueing or coaxing FIM - Locomotion: Ambulation Locomotion: Ambulation: 0: Activity did not occur (unsafe to attempt at eval)  Comprehension Comprehension Mode: Auditory Comprehension: 7-Follows complex conversation/direction: With no assist  Expression Expression Mode: Verbal Expression: 7-Expresses complex ideas: With no assist  Social Interaction Social Interaction: 7-Interacts appropriately with others - No medications needed.  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  1. DVT Prophylaxis/Anticoagulation: Pharmaceutical: Lovenox  2. Pain Management: Continue neurontin for dysesthesias- .   -added low dose TCA at night  -prednisone taper for gout flare (improved) 3. Mood: Team to provide ego support for elevated levels of anxiety. :CSW to follow for evaluation and support.  4. Neuropsych: This patient is capable of making decisions  on his own behalf.  5. Neurogenic bladder: Continue Flomax, Cardura and urecholine.  -sounds as if he was having some kickoff prior to coming into the hospital.  -dc foley today. Begin i and o's today 6. Neurogenic bowel:  re-establishing bowel program. Suppository daily in am.  7. Anemia: hgb 9.4---track serially 8. Skin: Candidal irritation perineal area as well as MAST left buttock due to bowel incontince  -continue local care, keep skin dry, clean, turn 9. ID: continue vanc/ctx  LOS (Days) 3 A FACE TO FACE EVALUATION WAS PERFORMED  Siriah Treat T 08/11/2013 8:53 AM

## 2013-08-12 ENCOUNTER — Inpatient Hospital Stay (HOSPITAL_COMMUNITY): Payer: Medicare Other

## 2013-08-12 ENCOUNTER — Inpatient Hospital Stay (HOSPITAL_COMMUNITY): Payer: Medicare Other | Admitting: Occupational Therapy

## 2013-08-12 DIAGNOSIS — K592 Neurogenic bowel, not elsewhere classified: Secondary | ICD-10-CM

## 2013-08-12 DIAGNOSIS — M4716 Other spondylosis with myelopathy, lumbar region: Secondary | ICD-10-CM

## 2013-08-12 DIAGNOSIS — N319 Neuromuscular dysfunction of bladder, unspecified: Secondary | ICD-10-CM

## 2013-08-12 LAB — VANCOMYCIN, TROUGH: Vancomycin Tr: 26.7 ug/mL (ref 10.0–20.0)

## 2013-08-12 MED ORDER — PREDNISONE 10 MG PO TABS
10.0000 mg | ORAL_TABLET | Freq: Two times a day (BID) | ORAL | Status: DC
  Administered 2013-08-12 – 2013-08-14 (×5): 10 mg via ORAL
  Filled 2013-08-12 (×7): qty 1

## 2013-08-12 MED ORDER — PREDNISONE 10 MG PO TABS
10.0000 mg | ORAL_TABLET | Freq: Two times a day (BID) | ORAL | Status: DC
  Filled 2013-08-12 (×2): qty 1

## 2013-08-12 MED ORDER — DIPHENHYDRAMINE HCL 25 MG PO CAPS
25.0000 mg | ORAL_CAPSULE | ORAL | Status: DC | PRN
  Administered 2013-08-13 – 2013-08-19 (×17): 25 mg via ORAL
  Filled 2013-08-12 (×18): qty 1

## 2013-08-12 MED ORDER — LORATADINE 10 MG PO TABS
10.0000 mg | ORAL_TABLET | Freq: Every day | ORAL | Status: DC
  Administered 2013-08-12 – 2013-08-19 (×8): 10 mg via ORAL
  Filled 2013-08-12 (×9): qty 1

## 2013-08-12 MED ORDER — VANCOMYCIN HCL IN DEXTROSE 1-5 GM/200ML-% IV SOLN
1000.0000 mg | INTRAVENOUS | Status: DC
  Administered 2013-08-13 – 2013-08-18 (×6): 1000 mg via INTRAVENOUS
  Filled 2013-08-12 (×8): qty 200

## 2013-08-12 MED ORDER — SODIUM CHLORIDE 0.9 % IJ SOLN
10.0000 mL | INTRAMUSCULAR | Status: DC | PRN
  Administered 2013-08-13 – 2013-08-17 (×8): 10 mL

## 2013-08-12 MED ORDER — DIPHENHYDRAMINE HCL 50 MG/ML IJ SOLN: 25.0000 mg | INTRAMUSCULAR | Status: DC | PRN

## 2013-08-12 NOTE — Progress Notes (Signed)
Subjective/Complaints: Had a better day yesterday. Unable to cath himself yet. Spirits a little low at this point. A 12 point review of systems has been performed and if not noted above is otherwise negative.   Objective: Vital Signs: Blood pressure 156/69, pulse 79, temperature 98 F (36.7 C), temperature source Oral, resp. rate 19, height 6' (1.829 m), weight 81.602 kg (179 lb 14.4 oz), SpO2 98.00%. No results found. No results found for this basename: WBC, HGB, HCT, PLT,  in the last 72 hours No results found for this basename: NA, K, CL, CO, GLUCOSE, BUN, CREATININE, CALCIUM,  in the last 72 hours CBG (last 3)  No results found for this basename: GLUCAP,  in the last 72 hours  Wt Readings from Last 3 Encounters:  08/08/13 81.602 kg (179 lb 14.4 oz)  07/30/13 81.602 kg (179 lb 14.4 oz)  07/30/13 81.602 kg (179 lb 14.4 oz)    Physical Exam:  Constitutional: He is oriented to person, place, and time. He appears well-developed and well-nourished.  HENT:  Head: Normocephalic and atraumatic.  Right Ear: External ear normal.  Left Ear: External ear normal.  Eyes: Conjunctivae are normal. Pupils are equal, round, and reactive to light.  Neck: Normal range of motion. Neck supple. No JVD present. No tracheal deviation present. No thyromegaly present.  Cardiovascular: Normal rate, regular rhythm and normal heart sounds.  Respiratory: Effort normal and breath sounds normal. No respiratory distress. He has no wheezes. He has no rales.  GI: Soft. Bowel sounds are normal. He exhibits no distension. There is no tenderness. There is no rebound.  Abdominal wall weakness due to prior surgery.  Musculoskeletal: He exhibits tenderness (BLE shin/ankle with areas of dysesthesias. ).  Right forearm with reddened and edematous area--prior site of IV infiltration? Bilateral feet less tender. No obvious edema, redness, joint inflammation Lymphadenopathy:  He has no cervical adenopathy.   Neurological: He is alert and oriented to person, place, and time.  4+/5 strength bilateral UE's proximal to distal. Normal sensation in bilateral upper extremities Absent sensation left foot but intact above the ankle Left lower extremity strength 2+ to 3 minus hip flexion, 3+ knee extension 1/5 ankle plantarflexion 0/5 ankle dorsiflexion Near normal sensation right foot and right lower extremity Right lower extremity strength is 3/5 hip flexion knee extension, 2+ to 3 minus ankle dorsiflexion and plantarflexion  Skin: Skin is warm Left glutea/inguinal areas with improving macular rash. No swelling. Back incision clean and intact Psychiatric: His speech is normal. Judgment and thought content normal. His mood appears anxious. He is withdrawn. Cognition and memory are normal.    Assessment/Plan: 1. Functional deficits secondary to cauda equina, wound infection s/p I and D which require 3+ hours per day of interdisciplinary therapy in a comprehensive inpatient rehab setting. Physiatrist is providing close team supervision and 24 hour management of active medical problems listed below. Physiatrist and rehab team continue to assess barriers to discharge/monitor patient progress toward functional and medical goals.   FIM: FIM - Bathing Bathing Steps Patient Completed: Chest;Right Arm;Left Arm;Abdomen Bathing: 2: Max-Patient completes 3-4 4321f 10 parts or 25-49%  FIM - Upper Body Dressing/Undressing Upper body dressing/undressing steps patient completed: Thread/unthread right sleeve of pullover shirt/dresss;Thread/unthread left sleeve of pullover shirt/dress;Pull shirt over trunk;Put head through opening of pull over shirt/dress Upper body dressing/undressing: 5: Set-up assist to: Obtain clothing/put away FIM - Lower Body Dressing/Undressing Lower body dressing/undressing steps patient completed: Thread/unthread left pants leg Lower body dressing/undressing: 1: Total-Patient completed less  than 25% of tasks (STEDY to stand)  FIM - Hotel manager Devices: Grab bar or rail for support Toileting: 1: Total-Patient completed zero steps, helper did all 3  FIM - Diplomatic Services operational officer Devices: Elevated toilet seat;Grab bars Toilet Transfers: 2-From toilet/BSC: Max A (lift and lower assist);2-To toilet/BSC: Max A (lift and lower assist) (STEDY for transfer)  FIM - Bed/Chair Transfer Bed/Chair Transfer Assistive Devices: Arm rests Bed/Chair Transfer: 3: Supine > Sit: Mod A (lifting assist/Pt. 50-74%/lift 2 legs;3: Sit > Supine: Mod A (lifting assist/Pt. 50-74%/lift 2 legs)  FIM - Locomotion: Wheelchair Distance: 120 (per PT note) Locomotion: Wheelchair: 2: Travels 50 - 149 ft with supervision, cueing or coaxing FIM - Locomotion: Ambulation Locomotion: Ambulation: 0: Activity did not occur (unsafe to attempt at eval)  Comprehension Comprehension Mode: Auditory Comprehension: 7-Follows complex conversation/direction: With no assist  Expression Expression Mode: Verbal Expression: 7-Expresses complex ideas: With no assist  Social Interaction Social Interaction: 7-Interacts appropriately with others - No medications needed.  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  1. DVT Prophylaxis/Anticoagulation: Pharmaceutical: Lovenox  2. Pain Management: Continue neurontin for dysesthesias- .   -added low dose TCA at night  -prednisone taper for gout flare ---decrease to 10mg  bid 3. Mood: Team to provide ego support for elevated levels of anxiety. :CSW to follow for evaluation and support.  4. Neuropsych: This patient is capable of making decisions on his own behalf.  5. Neurogenic bladder: Continue Flomax, Cardura and urecholine.  -sounds as if  he was having some kickoff prior to coming into the hospital.  -continue I and O caths. Pt doing PTA 6. Neurogenic bowel:  re-establishing bowel program. Suppository daily in am.  7. Anemia: hgb 9.4---track serially 8. Skin: Candidal irritation perineal area, buttocks breakdown  -continue local care, keep skin dry, clean, turn 9. ID: continue vanc/ctx  LOS (Days) 4 A FACE TO FACE EVALUATION WAS PERFORMED  Dameian Crisman T 08/12/2013 8:44 AM

## 2013-08-12 NOTE — Plan of Care (Signed)
Problem: SCI BLADDER ELIMINATION Goal: RH STG MANAGE BLADDER WITH ASSISTANCE STG Manage Bladder With Mod Assistance  Outcome: Not Progressing On urecholine,requires in and out caths by staff.

## 2013-08-12 NOTE — Progress Notes (Signed)
Nursing Note: Pt in and out cathed for 525 cc by Jamal,NT . Pt tolerated well.wbb

## 2013-08-12 NOTE — Progress Notes (Signed)
Peripherally Inserted Central Catheter/Midline Placement  The IV Nurse has discussed with the patient and/or persons authorized to consent for the patient, the purpose of this procedure and the potential benefits and risks involved with this procedure.  The benefits include less needle sticks, lab draws from the catheter and patient may be discharged home with the catheter.  Risks include, but not limited to, infection, bleeding, blood clot (thrombus formation), and puncture of an artery; nerve damage and irregular heat beat.  Alternatives to this procedure were also discussed.  PICC/Midline Placement Documentation  PICC / Midline Single Lumen 08/12/13 PICC Left Basilic 46 cm 1.5 cm (Active)       Netta Corriganhomas, Cathe Bilger L 08/12/2013, 10:16 PM

## 2013-08-12 NOTE — Progress Notes (Signed)
CRITICAL VALUE ALERT  Critical value received:  vanc trough  Date of notification:  08/12/13  Time of notification:  1604  Critical value read back:yes  Nurse who received alert:  Ronny BaconWhitney Marquie Aderhold, RN  MD notified (1st page):  Marissa NestlePam Love, PA  Time of first page:  1604  MD notified (2nd page):  Time of second page:  Responding MD:  Marissa NestlePam Love, PA  Time MD responded:  1604  Marissa NestlePam Love, PA recommended we call pharmacy to inform them of this lab value to adjust drug administration.    Dani Gobbleeardon, Achaia Garlock J, RN

## 2013-08-12 NOTE — Progress Notes (Signed)
Physical Therapy Session Note  Patient Details  Name: [name] MRN: [id number] Date of Birth: 05/18/1941  Today's Date: 08/12/2013 Time: 0830-0928 Time Calculation (min): 58 min  Short Term Goals: Week 1:  PT Short Term Goal 1 (Week 1): Pt will be able to transfer bed <-> w/c with mod A PT Short Term Goal 2 (Week 1): Pt will be able to perform supine <-> sit with min A maintaining back precautions PT Short Term Goal 3 (Week 1): Pt will be able to propel w/c on unit x 100' with S  Skilled Therapeutic Interventions/Progress Updates:   Assisted pt with finishing donning shirt and pants with overall min A; max A needed for donning shoes and AFO. Pt able to complete sit to stand at sink with S and required steady A for balance to pull up pants. W/c propulsion on unit mod I for overall endurance and strengthening. Practiced transfers and bed mobility in ADL apartment on flat surface with close S/steady A transfer and able to go sit to supine with min A and supine to sit with S. Simulated car transfer with S into the car and minA out of the car squat pivot x 2 reps. Gait in parallel bars with focus on step length (improved ataxia noted since last admission), decreased control on LLE, and widening BOS with min A forward x 5' and then back x 5' multiple reps with seated rest breaks.   Therapy Documentation Precautions:  Precautions Precautions: Back;Fall Precaution Comments: Patient able to verbalize 2/3 back precautions Required Braces or Orthoses:  (has abdominal binder longstanding; L AFO) Restrictions Weight Bearing Restrictions: No   Pain:  No complaints.  See FIM for current functional status  Therapy/Group: Individual Therapy  Karolee StampsGray, Hilja Kintzel [facility] 08/12/2013, 9:29 AM

## 2013-08-12 NOTE — Progress Notes (Signed)
Physical Therapy Session Note  Patient Details  Name: [name] MRN: [id number] Date of Birth: 07/02/1941  Today's Date: 08/12/2013 Time: 1345-1410 Time Calculation (min): 25 min  Short Term Goals: Week 1:  PT Short Term Goal 1 (Week 1): Pt will be able to transfer bed <-> w/c with mod A PT Short Term Goal 2 (Week 1): Pt will be able to perform supine <-> sit with min A maintaining back precautions PT Short Term Goal 3 (Week 1): Pt will be able to propel w/c on unit x 100' with S  Skilled Therapeutic Interventions/Progress Updates:    Session focused on functional transfers w/c <-> furniture to simulate home environment and then back to bed end of session and w/c propulsion on carpeted surface with LE's only for endurance and strengthening. Pt required min A overall with transfers and mod A to return to supine due to needing A with BLE due to pain.  Therapy Documentation Precautions:  Precautions Precautions: Back;Fall Precaution Comments: Patient able to verbalize 2/3 back precautions Required Braces or Orthoses:  (has abdominal binder longstanding; L AFO) Restrictions Weight Bearing Restrictions: No  Pain: C/o back pain - RN was made aware and medication given. Ice applied to L hip/back end of session.  See FIM for current functional status  Therapy/Group: Individual Therapy  Karolee StampsGray, Anquan Azzarello [facility] 08/12/2013, 3:08 PM

## 2013-08-12 NOTE — Progress Notes (Signed)
Nursing Note: Pt felt urge to void and voided x2 incont. Pt changed and kept leaking urine as we turned him in bed.A: Bladder scanned for 800 cc.R: Pt in and out cathed by Courtney,NT for 750 cc.Pt tolerated well.wbb

## 2013-08-12 NOTE — Progress Notes (Signed)
Patient ID: [name], male   DOB: 02/03/1941, 73 y.o.   MRN: [id number] Foley removed yesterday, able to urinate on his own. Able to have a bm with feeling sensation. No pain to the left leg but continues with distal weakness. I did tal to dr Reynolds(neuro) about in hospital emg/ncv. The hospital do not provide the service and needs to be done in the neuro office. Portable machine is not accurate because of the sensitivity issue. Since he is getting better i will hold the procedure to be done as outpatient. [name] was informed and agrees.

## 2013-08-12 NOTE — Progress Notes (Signed)
ANTIBIOTIC CONSULT NOTE - FOLLOW UP  Pharmacy Consult for rocephin/vancomycin Indication: lumbar wound  Allergies  Allergen Reactions  . Morphine Other (See Comments)    REACTION: made him go crazy; "I had fun w/it; my family didn't think it was too funny"  . Metoprolol Other (See Comments)    REACTION:  Tachycardia; "don't remember how bad it was; it was so long ago"    Patient Measurements: Height: 6' (182.9 cm) Weight: 179 lb 14.4 oz (81.602 kg) IBW/kg (Calculated) : 77.6  Vital Signs: Temp: 98.5 F (36.9 C) (01/16 1436) Temp src: Oral (01/16 1436) BP: 136/58 mmHg (01/16 1436) Pulse Rate: 77 (01/16 1436) Intake/Output from previous day: 01/15 0701 - 01/16 0700 In: 240 [P.O.:240] Out: 1250 [Urine:1250] Intake/Output from this shift: Total I/O In: 240 [P.O.:240] Out: -   Labs: No results found for this basename: WBC, HGB, PLT, LABCREA, CREATININE,  in the last 72 hours Estimated Creatinine Clearance: 67.4 ml/min (by C-G formula based on Cr of 1.12).  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 ID = presumed lumbar infxn and will treat for 6 wks. Afebrile, WBC WNL. Vancomycin trough 26.7 supratherapeutic on 750 mg IV Q 12 hrs, all dose charted, trough drawn appropriately.  1/3 Vanco>> 1/3 Rocephin>> 1/3 Rifampin>>  1/5 VT = 12.7 >> change vanc to 1g q12 1/7 VT = 16.1 ok 1/13 VT = 24, dec to 750 Q 12 1/16 VT = 26.7   1/3 BC x 2>>negative 1/6 Wound>>negative 1/8 bcx x2 - ngtd 1/8 urine - neg   Goal of Therapy:  Vancomycin trough level 15-20 mcg/ml  Plan:  - Continue rocephin 2gm IV Q24H - Decrease vancomycin to 1g IV Q 24, next dose tomorrow at 0800 - f/u renal function, weekly BMET on Monday  Jake HuggerMei Ernestine [name], PharmD, BCPS  Clinical Pharmacist  Pager: [phone]    08/12/2013 4:07 PM

## 2013-08-12 NOTE — Progress Notes (Signed)
Nursing Note: Pt in and out cathed for 500 cc.Cathed by BB&T CorporationJamal,NT.wbb

## 2013-08-12 NOTE — Progress Notes (Signed)
Occupational Therapy Session Notes  Patient Details  Name: [name] MRN: [id number] Date of Birth: 12/19/1940  Today's Date: 08/12/2013  Short Term Goals: Week 1:  OT Short Term Goal 1 (Week 1): Patient will perform UB dressing with supervision while seated EOB OT Short Term Goal 2 (Week 1): Patient will complete LB dressing with moderate assistance while seated EOB (excluding shoes, socks only) OT Short Term Goal 3 (Week 1): Patient will complete toilet transfer with minimal assistance OT Short Term Goal 4 (Week 1): Patient will complete sit<>stand with moderate assistance using AD prn  Skilled Therapeutic Interventions/Progress Updates:   Session #1 [id number] - 60 Minutes Individual Therapy No complaints of pain Upon entering room, patient found supine in bed. Therapist assisted patient with donning of bilateral TEDs and socks. From here, nursing present for bowel program. Patient sat edge of bed to donn binder and shirt, then transferred > elevated toilet seat in bathroom using STEDY. After BM, patient transferred > w/c using STEDY. Patient able to stand at sink from here for therapist to donn brief. Patient then sat in w/c at sink to complete grooming tasks and UB bathing & dressing. Patient with increased fatigue during this session and takes more than reasonable amount of time to complete tasks. Patient left seated in w/c with all needed items within reach and PT present for next session.   Session #2 [id number] - 45 Minutes Individual Therapy No complaints of pain Upon entering room, patient found supine in bed. Patient engaged in bed mobility and sat edge of bed with supervision. Patient stood with RW and moderate assistance in order to pull pants up to waist. Patient then sat back on edge of bed and transferred > w/c with supervision. From here, patient propelled self from room> therapy gym for BUE strengthening exercise using ergometer for 12 minutes without any rest breaks.  After ergometer exercise, patient engaged in w/c propulsion/manuevering > first floor and outside. Once outside, patient engaged in shoulder flexion and elbow extension exercises while seated in w/c. Patient propelled self back to 4th floor and left with PT for next therapy session.   Precautions:  Precautions Precautions: Back;Fall Precaution Comments: Patient able to verbalize 2/3 back precautions Required Braces or Orthoses:  (has abdominal binder longstanding; L AFO) Restrictions Weight Bearing Restrictions: No  See FIM for current functional status  [name] 08/12/2013, 7:25 AM

## 2013-08-13 ENCOUNTER — Inpatient Hospital Stay (HOSPITAL_COMMUNITY): Payer: Medicare Other | Admitting: Physical Therapy

## 2013-08-13 ENCOUNTER — Inpatient Hospital Stay (HOSPITAL_COMMUNITY): Payer: Medicare Other | Admitting: *Deleted

## 2013-08-13 DIAGNOSIS — N319 Neuromuscular dysfunction of bladder, unspecified: Secondary | ICD-10-CM

## 2013-08-13 DIAGNOSIS — M4716 Other spondylosis with myelopathy, lumbar region: Secondary | ICD-10-CM

## 2013-08-13 DIAGNOSIS — K592 Neurogenic bowel, not elsewhere classified: Secondary | ICD-10-CM

## 2013-08-13 LAB — BASIC METABOLIC PANEL
BUN: 14 mg/dL (ref 6–23)
CO2: 28 mEq/L (ref 19–32)
Calcium: 9 mg/dL (ref 8.4–10.5)
Chloride: 104 mEq/L (ref 96–112)
Creatinine, Ser: 1.1 mg/dL (ref 0.50–1.35)
GFR calc Af Amer: 77 mL/min — ABNORMAL LOW (ref >90)
GFR calc non Af Amer: 66 mL/min — ABNORMAL LOW (ref >90)
GLUCOSE: 99 mg/dL (ref 70–99)
POTASSIUM: 4.1 meq/L (ref 3.7–5.3)
SODIUM: 143 meq/L (ref 137–147)

## 2013-08-13 NOTE — Plan of Care (Signed)
Problem: SCI BLADDER ELIMINATION Goal: RH STG MANAGE BLADDER WITH ASSISTANCE STG Manage Bladder With Mod Assistance  Outcome: Not Progressing Requires in and out caths by staff q 4 hrs

## 2013-08-13 NOTE — Progress Notes (Signed)
Physical Therapy Session Note  Patient Details  Name: Jake MarvelRonald W Straughter MRN: [id number] Date of Birth: 04/28/1941  Today's Date: 08/13/2013 Time: [id number] Time Calculation (min): 25 min  Short Term Goals: Week 1:  PT Short Term Goal 1 (Week 1): Pt will be able to transfer bed <-> w/c with mod A PT Short Term Goal 2 (Week 1): Pt will be able to perform supine <-> sit with min A maintaining back precautions PT Short Term Goal 3 (Week 1): Pt will be able to propel w/c on unit x 100' with S  Skilled Therapeutic Interventions/Progress Updates:  Pt was seen bedside in the pm. Pt propelled w/c to and from room for strengthening and generalized endurance. Performed mini squats and marching with rolling walker for LE strengthening. Pt returned to room. Pt transferred w/c to commode with min A and grab bar.    Therapy Documentation Precautions:  Precautions Precautions: Back;Fall Precaution Comments: Patient able to verbalize 2/3 back precautions Required Braces or Orthoses:  (has abdominal binder longstanding; L AFO) Restrictions Weight Bearing Restrictions: No General:   Pain: Pt c/o 6/10 back pain.  See FIM for current functional status  Therapy/Group: Individual Therapy  Rayford HalstedMitchell, Ashara Lounsbury G 08/13/2013, 2:39 PM

## 2013-08-13 NOTE — Progress Notes (Signed)
Occupational Therapy Note  Patient Details  Name: Ignacia MarvelRonald W Scheaffer MRN: [id number] Date of Birth: 10/18/1940 Today's Date: 08/13/2013  Time:  1600-1700  (60 min) Pain: 7/10 back Individual session  Pt. Sitting in wc upon OT arrival.  Pt.  Propelled wc from room to gym with no assistance.  Transferred to nustep with min assist with decreased rotation noted.  Pt used nustep for 7 min , 4 workload at 76 SPM.  Pt rested for 2 minutes.  Went  Another 7 min at 77- 84 STM.  Pt. Transferred back to wc with min assist and cues to pivot.  HR= 87; Oxygen = 96%.    >left in room in wc with call bell in reach.      Humberto Sealsdwards, Jakaya Jacobowitz J 08/13/2013, 4:36 PM

## 2013-08-13 NOTE — Progress Notes (Signed)
Patient ID: [name], male   DOB: 01/05/1941, 73 y.o.   MRN: [id number] Long day of pt. Walked in parallel bars. Had a bm. Needed the bladder cath twice. Able to feel suprapubic pressure and feelig.

## 2013-08-13 NOTE — Progress Notes (Signed)
Physical Therapy Session Note  Patient Details  Name: Jake MarvelRonald W Sotto MRN: [id number] Date of Birth: 10/07/1940  Today's Date: 08/13/2013 Time: [id number] Time Calculation (min): 43 min  Short Term Goals: Week 1:  PT Short Term Goal 1 (Week 1): Pt will be able to transfer bed <-> w/c with mod A PT Short Term Goal 2 (Week 1): Pt will be able to perform supine <-> sit with min A maintaining back precautions PT Short Term Goal 3 (Week 1): Pt will be able to propel w/c on unit x 100' with S  Skilled Therapeutic Interventions/Progress Updates:  Pt was seen bedside in the am. Pt propelled w/c to and from gym for UE strengthening. Pt transferred w/c to mat and mat to w/c with S and verbal cues. While sitting on edge of mat pt performed LE exercises, 3 sets x 10 reps each hip flex, LAQs and hip add/abd. In parallel bar, pt ambulated 5 feet forward/ 5 feet backward x 4 with min guard and verbal cues. Pt demonstrated improved control L LE with placement of 2 lbs weight.    Therapy Documentation Precautions:  Precautions Precautions: Back;Fall Precaution Comments: Patient able to verbalize 2/3 back precautions Required Braces or Orthoses:  (has abdominal binder longstanding; L AFO) Restrictions Weight Bearing Restrictions: No General:   Pain: Pt c/o 6/10 back pain.  See FIM for current functional status  Therapy/Group: Individual Therapy  Rayford HalstedMitchell, Emberlee Sortino G 08/13/2013, 11:32 AM

## 2013-08-13 NOTE — Progress Notes (Signed)
Subjective/Complaints: Self cathing, some sensation for BM A 12 point review of systems has been performed and if not noted above is otherwise negative.   Objective: Vital Signs: Blood pressure 187/84, pulse 67, temperature 97.7 F (36.5 C), temperature source Oral, resp. rate 17, height 6' (1.829 m), weight 81.602 kg (179 lb 14.4 oz), SpO2 97.00%. No results found. No results found for this basename: WBC, HGB, HCT, PLT,  in the last 72 hours  Recent Labs  08/13/13 0420  NA 143  K 4.1  CL 104  GLUCOSE 99  BUN 14  CREATININE 1.10  CALCIUM 9.0   CBG (last 3)  No results found for this basename: GLUCAP,  in the last 72 hours  Wt Readings from Last 3 Encounters:  08/08/13 81.602 kg (179 lb 14.4 oz)  07/30/13 81.602 kg (179 lb 14.4 oz)  07/30/13 81.602 kg (179 lb 14.4 oz)    Physical Exam:  Constitutional: He is oriented to person, place, and time. He appears well-developed and well-nourished.  HENT:  Head: Normocephalic and atraumatic.  Right Ear: External ear normal.  Left Ear: External ear normal.  Eyes: Conjunctivae are normal. Pupils are equal, round, and reactive to light.  Neck: Normal range of motion. Neck supple. No JVD present. No tracheal deviation present. No thyromegaly present.  Cardiovascular: Normal rate, regular rhythm and normal heart sounds.  Respiratory: Effort normal and breath sounds normal. No respiratory distress. He has no wheezes. He has no rales.  GI: Soft. Bowel sounds are normal. He exhibits no distension. There is no tenderness. There is no rebound.  Abdominal wall weakness due to prior surgery.  Musculoskeletal: He exhibits tenderness (BLE shin/ankle with areas of dysesthesias. ).  Right forearm with reddened and edematous area--prior site of IV infiltration? Bilateral feet less tender. No obvious edema, redness, joint inflammation Lymphadenopathy:  He has no cervical adenopathy.  Neurological: He is alert and oriented to person, place, and  time.  4+/5 strength bilateral UE's proximal to distal. Normal sensation in bilateral upper extremities Absent sensation left foot but intact above the ankle Left lower extremity strength 3 hip flexion, 3+ knee extension 1/5 ankle plantarflexion 0/5 ankle dorsiflexion Near normal sensation right foot and right lower extremity Right lower extremity strength is 4-/5 hip flexion knee extension, 4 minus ankle dorsiflexion and plantarflexion  Skin: Skin is warm  Psychiatric: His speech is normal. Judgment and thought content normal. His mood appears anxious. He is withdrawn. Cognition and memory are normal.    Assessment/Plan: 1. Functional deficits secondary to cauda equina, wound infection s/p I and D which require 3+ hours per day of interdisciplinary therapy in a comprehensive inpatient rehab setting. Physiatrist is providing close team supervision and 24 hour management of active medical problems listed below. Physiatrist and rehab team continue to assess barriers to discharge/monitor patient progress toward functional and medical goals.   FIM: FIM - Bathing Bathing Steps Patient Completed: Chest;Right Arm;Left Arm;Abdomen Bathing: 2: Max-Patient completes 3-4 69f 10 parts or 25-49%  FIM - Upper Body Dressing/Undressing Upper body dressing/undressing steps patient completed: Thread/unthread right sleeve of pullover shirt/dresss;Thread/unthread left sleeve of pullover shirt/dress;Pull shirt over trunk;Put head through opening of pull over shirt/dress Upper body dressing/undressing: 5: Set-up assist to: Obtain clothing/put away FIM - Lower Body Dressing/Undressing Lower body dressing/undressing steps patient completed: Thread/unthread left pants leg Lower body dressing/undressing: 1: Total-Patient completed less than 25% of tasks (STEDY to stand)  FIM - Hotel manager Devices: Grab bar or rail for support Toileting:  1: Total-Patient completed zero steps, helper did all  3  FIM - Diplomatic Services operational officerToilet Transfers Toilet Transfers Assistive Devices: Elevated toilet seat;Grab bars Toilet Transfers: 2-From toilet/BSC: Max A (lift and lower assist);2-To toilet/BSC: Max A (lift and lower assist) (STEDY for transfer)  FIM - Bed/Chair Transfer Bed/Chair Transfer Assistive Devices: Arm rests Bed/Chair Transfer: 5: Supine > Sit: Supervision (verbal cues/safety issues);4: Sit > Supine: Min A (steadying pt. > 75%/lift 1 leg);4: Chair or W/C > Bed: Min A (steadying Pt. > 75%);4: Bed > Chair or W/C: Min A (steadying Pt. > 75%)  FIM - Locomotion: Wheelchair Distance: 120 (per PT note) Locomotion: Wheelchair: 6: Travels 150 ft or more, turns around, maneuvers to table, bed or toilet, negotiates 3% grade: maneuvers on rugs and over door sills independently FIM - Locomotion: Ambulation Locomotion: Ambulation Assistive Devices: Parallel bars;Orthosis Ambulation/Gait Assistance: 4: Min assist Locomotion: Ambulation: 1: Travels less than 50 ft with minimal assistance (Pt.>75%)  Comprehension Comprehension Mode: Auditory Comprehension: 7-Follows complex conversation/direction: With no assist  Expression Expression Mode: Verbal Expression: 7-Expresses complex ideas: With no assist  Social Interaction Social Interaction: 7-Interacts appropriately with others - No medications needed.  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  1. DVT Prophylaxis/Anticoagulation: Pharmaceutical: Lovenox  2. Pain Management: Continue neurontin for dysesthesias- .   -added low dose TCA at night  -prednisone taper for gout flare ---decrease to 10mg  bid 3. Mood: Team to provide ego support for elevated levels of anxiety. :CSW to follow for evaluation and support.  4. Neuropsych: This patient is capable of  making decisions on his own behalf.  5. Neurogenic bladder: Continue Flomax, Cardura and urecholine.  -sounds as if he was having some kickoff prior to coming into the hospital.  -continue I and O caths. Pt doing PTA 6. Neurogenic bowel:  re-establishing bowel program. Suppository daily in am.  7. Anemia: hgb 9.4---track serially 8. Skin: Candidal irritation perineal area, buttocks breakdown  -continue local care, keep skin dry, clean, turn 9. ID: continue vanc/ctx  LOS (Days) 5 A FACE TO FACE EVALUATION WAS PERFORMED  Nyx Keady E 08/13/2013 7:09 AM

## 2013-08-13 NOTE — Progress Notes (Signed)
Physical Therapy Session Note  Patient Details  Name: [name] MRN: [id number] Date of Birth: 10/10/1940  Today's Date: 08/13/2013 Time: 1100-1200 Time Calculation (min): 60 min  Short Term Goals: Week 1:  PT Short Term Goal 1 (Week 1): Pt will be able to transfer bed <-> w/c with mod A PT Short Term Goal 2 (Week 1): Pt will be able to perform supine <-> sit with min A maintaining back precautions PT Short Term Goal 3 (Week 1): Pt will be able to propel w/c on unit x 100' with S  Therapy Documentation Precautions:  Precautions Precautions: Back;Fall Precaution Comments: Patient able to verbalize 2/3 back precautions Required Braces or Orthoses:  (has abdominal binder longstanding; L AFO) Restrictions Weight Bearing Restrictions: No Pain: Pain Assessment Pain Assessment: 0-10 Pain Score: 6  Faces Pain Scale: Hurts a little bit Pain Type: Surgical pain Pain Location: Back Pain Orientation: Lower Pain Descriptors / Indicators: Aching Pain Frequency: Intermittent Patients Stated Pain Goal: 3 Pain Intervention(s): Medication (See eMAR)   Therapeutic Exercise; (60') B LE's exercised on mat table including use of 2# ankle weights. Patient performed the following exercises 2 to 3 sets of 20 reps, in supine: Short Arc Quads, Bent Knee "marching" in supine, manually resisted hip adduction and abduction.    Transfers from w/c to mat table via squat pivot transfer with S/min-a and into right sidelying with Max-assist to bring B LE up to level of mat table. Transfer from low mat table up to w/c post exercise via squat pivot transfer required Mod-assist. Rehab Aide/Tech was present to assist with group exercise program.   Therapy/Group: Group Therapy  Marchia Diguglielmo J 08/13/2013, 12:49 PM

## 2013-08-14 MED ORDER — PREDNISONE 10 MG PO TABS
10.0000 mg | ORAL_TABLET | Freq: Every day | ORAL | Status: DC
  Administered 2013-08-15: 10 mg via ORAL
  Filled 2013-08-14 (×2): qty 1

## 2013-08-14 NOTE — Progress Notes (Signed)
Subjective/Complaints: Self cathing, some sensation for BM A 12 point review of systems has been performed and if not noted above is otherwise negative.   Objective: Vital Signs: Blood pressure 174/86, pulse 79, temperature 98.6 F (37 C), temperature source Oral, resp. rate 19, height 6' (1.829 m), weight 81.602 kg (179 lb 14.4 oz), SpO2 96.00%. No results found. No results found for this basename: WBC, HGB, HCT, PLT,  in the last 72 hours  Recent Labs  08/13/13 0420  NA 143  K 4.1  CL 104  GLUCOSE 99  BUN 14  CREATININE 1.10  CALCIUM 9.0   CBG (last 3)  No results found for this basename: GLUCAP,  in the last 72 hours  Wt Readings from Last 3 Encounters:  08/08/13 81.602 kg (179 lb 14.4 oz)  07/30/13 81.602 kg (179 lb 14.4 oz)  07/30/13 81.602 kg (179 lb 14.4 oz)    Physical Exam:  Constitutional: He is oriented to person, place, and time. He appears well-developed and well-nourished.  HENT:  Head: Normocephalic and atraumatic.  Right Ear: External ear normal.  Left Ear: External ear normal.  Eyes: Conjunctivae are normal. Pupils are equal, round, and reactive to light.  Neck: Normal range of motion. Neck supple. No JVD present. No tracheal deviation present. No thyromegaly present.  Cardiovascular: Normal rate, regular rhythm and normal heart sounds.  Respiratory: Effort normal and breath sounds normal. No respiratory distress. He has no wheezes. He has no rales.  GI: Soft. Bowel sounds are normal. He exhibits no distension. There is no tenderness. There is no rebound.  Abdominal wall weakness due to prior surgery.  Musculoskeletal: He exhibits no pain with active ROM Right forearm normal Lymphadenopathy:  He has no cervical adenopathy.  Neurological: He is alert and oriented to person, place, and time.  4+/5 strength bilateral UE's proximal to distal. Normal sensation in bilateral upper extremities Absent sensation left foot but intact above the ankle Left  lower extremity strength 3 hip flexion, 3+ knee extension 1/5 ankle plantarflexion 0/5 ankle dorsiflexion Near normal sensation right foot and right lower extremity Right lower extremity strength is 4-/5 hip flexion knee extension, 4 minus ankle dorsiflexion and plantarflexion  Skin: Skin is warm  Psychiatric: His speech is normal. Judgment and thought content normal. His mood appears anxious. He is withdrawn. Cognition and memory are normal.    Assessment/Plan: 1. Functional deficits secondary to cauda equina, wound infection s/p I and D which require 3+ hours per day of interdisciplinary therapy in a comprehensive inpatient rehab setting. Physiatrist is providing close team supervision and 24 hour management of active medical problems listed below. Physiatrist and rehab team continue to assess barriers to discharge/monitor patient progress toward functional and medical goals.   FIM: FIM - Bathing Bathing Steps Patient Completed: Chest;Right Arm;Left Arm;Abdomen Bathing: 2: Max-Patient completes 3-4 144f 10 parts or 25-49%  FIM - Upper Body Dressing/Undressing Upper body dressing/undressing steps patient completed: Thread/unthread right sleeve of pullover shirt/dresss;Thread/unthread left sleeve of pullover shirt/dress;Pull shirt over trunk;Put head through opening of pull over shirt/dress Upper body dressing/undressing: 5: Set-up assist to: Obtain clothing/put away FIM - Lower Body Dressing/Undressing Lower body dressing/undressing steps patient completed: Thread/unthread left pants leg Lower body dressing/undressing: 1: Total-Patient completed less than 25% of tasks (STEDY to stand)  FIM - Hotel managerToileting Toileting Assistive Devices: Grab bar or rail for support Toileting: 1: Total-Patient completed zero steps, helper did all 3  FIM - Diplomatic Services operational officerToilet Transfers Toilet Transfers Assistive Devices: Elevated toilet seat;Grab bars  Toilet Transfers: 2-From toilet/BSC: Max A (lift and lower assist);2-To  toilet/BSC: Max A (lift and lower assist) (STEDY for transfer)  FIM - Press photographer Assistive Devices: Arm rests Bed/Chair Transfer: 5: Chair or W/C > Bed: Supervision (verbal cues/safety issues);5: Bed > Chair or W/C: Supervision (verbal cues/safety issues)  FIM - Locomotion: Wheelchair Distance: 120 (per PT note) Locomotion: Wheelchair: 6: Travels 150 ft or more, turns around, maneuvers to table, bed or toilet, negotiates 3% grade: maneuvers on rugs and over door sills independently FIM - Locomotion: Ambulation Locomotion: Ambulation Assistive Devices: Parallel bars;Orthosis Ambulation/Gait Assistance: 4: Min assist Locomotion: Ambulation: 1: Travels less than 50 ft with minimal assistance (Pt.>75%)  Comprehension Comprehension Mode: Auditory Comprehension: 7-Follows complex conversation/direction: With no assist  Expression Expression Mode: Verbal Expression: 7-Expresses complex ideas: With no assist  Social Interaction Social Interaction: 7-Interacts appropriately with others - No medications needed.  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  1. DVT Prophylaxis/Anticoagulation: Pharmaceutical: Lovenox  2. Pain Management: Continue neurontin for dysesthesias- .   -added low dose TCA at night  -prednisone taper for gout flare ---decrease to 10mg  qam starting on 1/19 3. Mood: Team to provide ego support for elevated levels of anxiety. :CSW to follow for evaluation and support.  4. Neuropsych: This patient is capable of making decisions on his own behalf.  5. Neurogenic bladder: Continue Flomax, Cardura and urecholine.    -continue I and O caths. Pt doing PTA 6. Neurogenic bowel:  re-establishing bowel program. Suppository daily in am.  7. Anemia: hgb  9.4---track serially 8. Skin: Candidal irritation perineal area, buttocks breakdown  -continue local care, keep skin dry, clean, turn 9. ID: continue vanc/ctx  LOS (Days) 6 A FACE TO FACE EVALUATION WAS PERFORMED  Kamryn Gauthier E 08/14/2013 9:07 AM

## 2013-08-14 NOTE — Progress Notes (Signed)
Patient ID: [name], male   DOB: 10/14/1940, 73 y.o.   MRN: [id number] [facility] was able to have a bm on his own without suppository but was unable to hold his bladder. No changes in his legs

## 2013-08-15 ENCOUNTER — Encounter: Payer: Medicare Other | Admitting: Physical Medicine & Rehabilitation

## 2013-08-15 ENCOUNTER — Inpatient Hospital Stay (HOSPITAL_COMMUNITY): Payer: Medicare Other | Admitting: Physical Therapy

## 2013-08-15 ENCOUNTER — Inpatient Hospital Stay (HOSPITAL_COMMUNITY): Payer: Medicare Other | Admitting: Occupational Therapy

## 2013-08-15 ENCOUNTER — Inpatient Hospital Stay (HOSPITAL_COMMUNITY): Payer: Medicare Other

## 2013-08-15 DIAGNOSIS — M545 Low back pain, unspecified: Secondary | ICD-10-CM | POA: Diagnosis not present

## 2013-08-15 LAB — BASIC METABOLIC PANEL
BUN: 13 mg/dL (ref 6–23)
CALCIUM: 8.9 mg/dL (ref 8.4–10.5)
CHLORIDE: 105 meq/L (ref 96–112)
CO2: 29 meq/L (ref 19–32)
Creatinine, Ser: 1.15 mg/dL (ref 0.50–1.35)
GFR calc Af Amer: 73 mL/min — ABNORMAL LOW (ref >90)
GFR calc non Af Amer: 63 mL/min — ABNORMAL LOW (ref >90)
GLUCOSE: 96 mg/dL (ref 70–99)
Potassium: 4.3 mEq/L (ref 3.7–5.3)
SODIUM: 144 meq/L (ref 137–147)

## 2013-08-15 MED ORDER — COLCHICINE 0.6 MG PO TABS
0.6000 mg | ORAL_TABLET | ORAL | Status: AC
  Administered 2013-08-15: 0.6 mg via ORAL
  Filled 2013-08-15: qty 1

## 2013-08-15 MED ORDER — OXYCODONE HCL ER 10 MG PO T12A
20.0000 mg | EXTENDED_RELEASE_TABLET | Freq: Two times a day (BID) | ORAL | Status: DC
  Administered 2013-08-15 – 2013-08-19 (×8): 20 mg via ORAL
  Filled 2013-08-15 (×8): qty 2

## 2013-08-15 MED ORDER — OXYCODONE HCL ER 10 MG PO T12A
10.0000 mg | EXTENDED_RELEASE_TABLET | ORAL | Status: AC
  Administered 2013-08-15: 10 mg via ORAL
  Filled 2013-08-15: qty 1

## 2013-08-15 MED ORDER — COLCHICINE 0.6 MG PO TABS
0.6000 mg | ORAL_TABLET | Freq: Two times a day (BID) | ORAL | Status: DC
  Administered 2013-08-15 – 2013-08-19 (×8): 0.6 mg via ORAL
  Filled 2013-08-15 (×10): qty 1

## 2013-08-15 MED ORDER — PREDNISONE 10 MG PO TABS
10.0000 mg | ORAL_TABLET | Freq: Two times a day (BID) | ORAL | Status: DC
  Administered 2013-08-15 – 2013-08-16 (×2): 10 mg via ORAL
  Filled 2013-08-15 (×4): qty 1

## 2013-08-15 MED ORDER — METHOCARBAMOL 500 MG PO TABS: 1000.0000 mg | ORAL_TABLET | Freq: Four times a day (QID) | ORAL | Status: DC | PRN

## 2013-08-15 MED ORDER — METHOCARBAMOL 500 MG PO TABS
1000.0000 mg | ORAL_TABLET | Freq: Four times a day (QID) | ORAL | Status: DC | PRN
  Administered 2013-08-15 – 2013-08-16 (×2): 1000 mg via ORAL
  Filled 2013-08-15 (×2): qty 2

## 2013-08-15 NOTE — Progress Notes (Signed)
Recreational Therapy Session Note  Patient Details  Name: [name] MRN: [id number] Date of Birth: 10/27/1940 Today's Date: 08/15/2013  Pain: no c/o Skilled Therapeutic Interventions/Progress Updates: Session focused on activity tolerance, w/c mobility, dynamic sitting balance.  Pt propelled w/c from room to gym with mod i.  Pt sat on dynodisc on mat in the gym using BUE's for basketball activity with close supervision-min assist.  Therapy/Group: Co-Treatment  [name] 08/15/2013, 4:02 PM

## 2013-08-15 NOTE — Progress Notes (Signed)
NT unable to in and out cath patient due to resistance; RN attempted cath and was unable as well due to resistance.  Small amount of blood noted in catheter tip.  Marissa NestlePam Love, PA notified; new order to use coude cath for in and out caths.  Will continue to monitor.

## 2013-08-15 NOTE — Progress Notes (Signed)
Subjective/Complaints: Low back sore, especially when he sits up. Had a pretty good day yesterday. Still unable to cath self while he's on air overaly. A 12 point review of systems has been performed and if not noted above is otherwise negative.   Objective: Vital Signs: Blood pressure 165/69, pulse 81, temperature 97 F (36.1 C), temperature source Oral, resp. rate 20, height 6' (1.829 m), weight 81.602 kg (179 lb 14.4 oz), SpO2 93.00%. No results found. No results found for this basename: WBC, HGB, HCT, PLT,  in the last 72 hours  Recent Labs  08/13/13 0420 08/15/13 0455  NA 143 144  K 4.1 4.3  CL 104 105  GLUCOSE 99 96  BUN 14 13  CREATININE 1.10 1.15  CALCIUM 9.0 8.9   CBG (last 3)  No results found for this basename: GLUCAP,  in the last 72 hours  Wt Readings from Last 3 Encounters:  08/08/13 81.602 kg (179 lb 14.4 oz)  07/30/13 81.602 kg (179 lb 14.4 oz)  07/30/13 81.602 kg (179 lb 14.4 oz)    Physical Exam:  Constitutional: He is oriented to person, place, and time. He appears well-developed and well-nourished.  HENT:  Head: Normocephalic and atraumatic.  Right Ear: External ear normal.  Left Ear: External ear normal.  Eyes: Conjunctivae are normal. Pupils are equal, round, and reactive to light.  Neck: Normal range of motion. Neck supple. No JVD present. No tracheal deviation present. No thyromegaly present.  Cardiovascular: Normal rate, regular rhythm and normal heart sounds.  Respiratory: Effort normal and breath sounds normal. No respiratory distress. He has no wheezes. He has no rales.  GI: Soft. Bowel sounds are normal. He exhibits no distension. There is no tenderness. There is no rebound.  Abdominal wall weakness due to prior surgery.  Musculoskeletal: He exhibits no pain with active ROM Right forearm normal Lymphadenopathy:  He has no cervical adenopathy.  Neurological: He is alert and oriented to person, place, and time.  4+/5 strength bilateral  UE's proximal to distal. Normal sensation in bilateral upper extremities Absent sensation left foot but intact above the ankle Left lower extremity strength 3 hip flexion, 3+ knee extension 1/5 ankle plantarflexion 0/5 ankle dorsiflexion Near normal sensation right foot and right lower extremity Right lower extremity strength is 4-/5 hip flexion knee extension, 4 minus ankle dorsiflexion and plantarflexion  Skin: Skin is warm.. Surgical site well approximated. No drainage, swelling, discoloration  Psychiatric: His speech is normal. Judgment and thought content normal. His mood appears anxious. He is withdrawn. Cognition and memory are normal.    Assessment/Plan: 1. Functional deficits secondary to cauda equina, wound infection s/p I and D which require 3+ hours per day of interdisciplinary therapy in a comprehensive inpatient rehab setting. Physiatrist is providing close team supervision and 24 hour management of active medical problems listed below. Physiatrist and rehab team continue to assess barriers to discharge/monitor patient progress toward functional and medical goals.   FIM: FIM - Bathing Bathing Steps Patient Completed: Chest;Right Arm;Left Arm;Abdomen Bathing: 2: Max-Patient completes 3-4 18f 10 parts or 25-49%  FIM - Upper Body Dressing/Undressing Upper body dressing/undressing steps patient completed: Thread/unthread right sleeve of pullover shirt/dresss;Thread/unthread left sleeve of pullover shirt/dress;Pull shirt over trunk;Put head through opening of pull over shirt/dress Upper body dressing/undressing: 5: Set-up assist to: Obtain clothing/put away FIM - Lower Body Dressing/Undressing Lower body dressing/undressing steps patient completed: Thread/unthread left pants leg Lower body dressing/undressing: 1: Total-Patient completed less than 25% of tasks (STEDY to stand)  FIM - Toileting Toileting Assistive Devices: Grab bar or rail for support Toileting: 1: Total-Patient  completed zero steps, helper did all 3  FIM - Diplomatic Services operational officerToilet Transfers Toilet Transfers Assistive Devices: Elevated toilet seat;Grab bars Toilet Transfers: 2-From toilet/BSC: Max A (lift and lower assist);2-To toilet/BSC: Max A (lift and lower assist) (STEDY for transfer)  FIM - Press photographerBed/Chair Transfer Bed/Chair Transfer Assistive Devices: Arm rests Bed/Chair Transfer: 5: Chair or W/C > Bed: Supervision (verbal cues/safety issues);5: Bed > Chair or W/C: Supervision (verbal cues/safety issues)  FIM - Locomotion: Wheelchair Distance: 120 (per PT note) Locomotion: Wheelchair: 6: Travels 150 ft or more, turns around, maneuvers to table, bed or toilet, negotiates 3% grade: maneuvers on rugs and over door sills independently FIM - Locomotion: Ambulation Locomotion: Ambulation Assistive Devices: Parallel bars;Orthosis Ambulation/Gait Assistance: 4: Min assist Locomotion: Ambulation: 1: Travels less than 50 ft with minimal assistance (Pt.>75%)  Comprehension Comprehension Mode: Auditory Comprehension: 7-Follows complex conversation/direction: With no assist  Expression Expression Mode: Verbal Expression: 7-Expresses complex ideas: With no assist  Social Interaction Social Interaction: 7-Interacts appropriately with others - No medications needed.  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  1. DVT Prophylaxis/Anticoagulation: Pharmaceutical: Lovenox  2. Pain Management: Continue neurontin for dysesthesias- .   -added low dose TCA at night  -prednisone taper for gout flare ---decrease to 10mg  qam starting today  -having a lot of low back pain still--increase oxycontin to 20mg  3. Mood: Team to provide ego support for elevated levels of anxiety. :CSW to follow for evaluation and support.  4. Neuropsych:  This patient is capable of making decisions on his own behalf.  5. Neurogenic bladder: Continue Flomax, Cardura and urecholine.    -continue I and O caths. Try to get to eob or toilet to perform---difficult on air mattress. 6. Neurogenic bowel:  re-establishing bowel program. Suppository daily in am.  7. Anemia: hgb 9.4---track serially 8. Skin: Candidal irritation perineal area, buttocks breakdown---improving  -continue local care, keep skin dry, clean, turn 9. ID: continue vanc/ctx  LOS (Days) 7 A FACE TO FACE EVALUATION WAS PERFORMED  Amberia Bayless T 08/15/2013 8:19 AM

## 2013-08-15 NOTE — Progress Notes (Signed)
ANTIBIOTIC CONSULT NOTE - FOLLOW UP  Pharmacy Consult for rocephin/vancomycin Indication: lumbar wound  Assessment: 73 y/o M on Abx D#16 for epidural abscesses, s/p I&D of site. ID = presumed lumbar infxn and will treat for 6 wks. Afebrile, WBC WNL. Vancomycin dose adjusted on 1/16 based on trough level. Scr 1.15 today, has been stable.  1/3 Vanco>> 1/3 Rocephin>> 1/3 Rifampin>>  1/5 VT = 12.7 >> change vanc to 1g q12 1/7 VT = 16.1 ok 1/13 VT = 24, dec to 750 Q 12 1/16 VT = 26.7 dec to 1g Q 24  1/3 BC x 2>>negative 1/6 Wound>>negative 1/8 bcx x2 - ngtd 1/8 urine - neg   Goal of Therapy:  Vancomycin trough level 15-20 mcg/ml  Plan:  - Continue rocephin 2gm IV Q24H - Continue vancomycin to 1g IV Q 24,  - f/u renal function, weekly BMET on Monday - Consider recheck vancomycin 1/22  Bayard HuggerMei Shavell Nored, PharmD, BCPS  Clinical Pharmacist  Pager: [phone]    08/15/2013 1:54 PM

## 2013-08-15 NOTE — Progress Notes (Signed)
Physical Therapy Session Note  Patient Details  Name: [name] MRN: [id number] Date of Birth: 06/13/1941  Today's Date: 08/15/2013 Time: 0830-0927 Time Calculation (min): 57 min  Short Term Goals: Week 1:  PT Short Term Goal 1 (Week 1): Pt will be able to transfer bed <-> w/c with mod A PT Short Term Goal 2 (Week 1): Pt will be able to perform supine <-> sit with min A maintaining back precautions PT Short Term Goal 3 (Week 1): Pt will be able to propel w/c on unit x 100' with S  Skilled Therapeutic Interventions/Progress Updates:   Pt presents on mat in sidelying due to pain . Initially focused on hip flexor stretching bilaterally to decrease pain and increase ROM/flexibility 3 reps x 30 seconds BLE. LE therex for functional strengthening with 2# ankle weights including heel slides, hip abduction, SAQ, and isometric hip abduction and adduction in sitting x 10 reps x 3 sets each. Required min A for supine to sit on flat mat. Deferred standing this AM due to increased back pain. Practiced up/down ramp in w/c for community mobility and home entry with min A progressing to close S. Self propelled w/c back to room with UE's for functional endurance training.   Therapy Documentation Precautions:  Precautions Precautions: Back;Fall Precaution Comments: Verbalize 3/3 back precautions Required Braces or Orthoses:  (has abdominal binder longstanding; L AFO) Restrictions Weight Bearing Restrictions: No   Pain:  c/o significant back pain this morning - RN already aware and medication administered.   See FIM for current functional status  Therapy/Group: Individual Therapy  Karolee StampsGray, Berma Harts [facility] 08/15/2013, 9:31 AM

## 2013-08-15 NOTE — Progress Notes (Signed)
Physical Therapy Session Note  Patient Details  Name: Jake MarvelRonald W Brandenburg MRN: [id number] Date of Birth: 05/09/1941  Today's Date: 08/15/2013 Time: 1030-1100 Time Calculation (min): 30 min  Short Term Goals: Week 1:  PT Short Term Goal 1 (Week 1): Pt will be able to transfer bed <-> w/c with mod A PT Short Term Goal 2 (Week 1): Pt will be able to perform supine <-> sit with min A maintaining back precautions PT Short Term Goal 3 (Week 1): Pt will be able to propel w/c on unit x 100' with S  Skilled Therapeutic Interventions/Progress Updates:    Session focused on transverse abdominal (TA) contraction and strengthening. Started positioned in supine with bil LEs flexed holding 5 sec progressing to 30 sec. Added alternating single leg lifts with TA contraction, PT demonstrated progression to alternating single leg extension. Carryover cues for transitional movements and transfers.   Therapy Documentation Precautions:  Precautions Precautions: Back;Fall Precaution Comments: Verbalize 3/3 back precautions Required Braces or Orthoses:  (has abdominal binder longstanding; L AFO) Restrictions Weight Bearing Restrictions: No  Pain: Pain Assessment Pain Assessment: 0-10 Pain Score: 8  Pain Type: Surgical pain Pain Location: Back Pain Orientation: Lower Pain Descriptors / Indicators: Throbbing Pain Frequency: Constant Pain Onset: With Activity RN medicated pt prior to session  See FIM for current functional status  Therapy/Group: Individual Therapy  Wilhemina BonitoGillispie, Candela Krul Cheek 08/15/2013, 12:23 PM

## 2013-08-15 NOTE — Progress Notes (Signed)
Occupational Therapy Session Notes  Patient Details  Name: [name] MRN: [id number] Date of Birth: 07/10/1941  Today's Date: 08/15/2013  Short Term Goals: Week 1:  OT Short Term Goal 1 (Week 1): Patient will perform UB dressing with supervision while seated EOB OT Short Term Goal 2 (Week 1): Patient will complete LB dressing with moderate assistance while seated EOB (excluding shoes, socks only) OT Short Term Goal 3 (Week 1): Patient will complete toilet transfer with minimal assistance OT Short Term Goal 4 (Week 1): Patient will complete sit<>stand with moderate assistance using AD prn  Skilled Therapeutic Interventions/Progress Updates:   Session #1 [id number] - 55 Minutes Individual Therapy Patient with 5/10 pain in back; RN aware Upon entering room, patient found seated in w/c. Patient stated he completed his bathing & dressing tasks this am with some assistance from staff with peri cleansing and donning of shoes. Therapist and patient discussed d/c planning, thinking patient may be able to discharge this week if medically stable. Patient propelled self from room > therapy gym. Once in gym, patient engaged in BUE strengthening using ergometer for ~15 minutes. From here, patient propelled self > high/low table in gym. Patient stood at table to engage in dynamic standing task. Secondary to increased pain, patient unable to maintain standing for long. He sat down and therapist notified RN of increased pain. Patient then transferred > therapy mat and transferred > side lying position. Left patient in this position for next PT session and notified RN of patient's complaints.   Session #2 [id number] - 45 Minutes Co-Treatment with TR Patient with complaints of pain at end of session, notified RN Upon entering room, patient found seated in w/c. Patient propelled self > therapy gym for therapeutic activity focusing on dynamic sitting balance/tolerance/endurance (patient sitting on  dyna-disc), pain management, and overall activity tolerance/endurance. Patient with incontinent BM, Patient transferred > toilet with min assist (stand pivot). Patient required total assistance with toileting. Patient transferred back to bed at end of session, therapist donned ice packs to back and left patient in side lying with all needed items within reach.   Precautions:  Precautions Precautions: Back;Fall Precaution Comments: Patient able to verbalize 2/3 back precautions Required Braces or Orthoses:  (has abdominal binder longstanding; L AFO) Restrictions Weight Bearing Restrictions: No  See FIM for current functional status  Kynzi Levay 08/15/2013, 7:18 AM

## 2013-08-15 NOTE — Progress Notes (Signed)
Patient ID: [name], male   DOB: 01/08/1941, 73 y.o.   MRN: [id number] Stable. i was present during his bladder cath. He has some feeling while introducing it. Rectal tone done. He was able to feel around the anus but no contractions present

## 2013-08-16 ENCOUNTER — Inpatient Hospital Stay (HOSPITAL_COMMUNITY): Payer: Medicare Other

## 2013-08-16 ENCOUNTER — Inpatient Hospital Stay (HOSPITAL_COMMUNITY): Payer: Medicare Other | Admitting: Occupational Therapy

## 2013-08-16 ENCOUNTER — Inpatient Hospital Stay (HOSPITAL_COMMUNITY): Payer: Medicare Other | Admitting: *Deleted

## 2013-08-16 ENCOUNTER — Inpatient Hospital Stay (HOSPITAL_COMMUNITY): Payer: Medicare Other | Admitting: Physical Therapy

## 2013-08-16 DIAGNOSIS — N319 Neuromuscular dysfunction of bladder, unspecified: Secondary | ICD-10-CM

## 2013-08-16 DIAGNOSIS — M4716 Other spondylosis with myelopathy, lumbar region: Secondary | ICD-10-CM

## 2013-08-16 DIAGNOSIS — K592 Neurogenic bowel, not elsewhere classified: Secondary | ICD-10-CM

## 2013-08-16 MED ORDER — PREDNISONE 20 MG PO TABS
20.0000 mg | ORAL_TABLET | Freq: Two times a day (BID) | ORAL | Status: DC
  Administered 2013-08-16 – 2013-08-19 (×6): 20 mg via ORAL
  Filled 2013-08-16 (×8): qty 1

## 2013-08-16 NOTE — Plan of Care (Signed)
Problem: SCI BLADDER ELIMINATION Goal: RH STG MANAGE BLADDER WITH ASSISTANCE STG Manage Bladder With Mod Assistance  Outcome: Progressing Voiding some still requiring in and out caths.

## 2013-08-16 NOTE — Progress Notes (Signed)
Nursing Note: Pt asleep.wbb 

## 2013-08-16 NOTE — Progress Notes (Signed)
Nursing Note: Oxycodone prn given.Pt is much calmer now.wbb

## 2013-08-16 NOTE — Progress Notes (Addendum)
Nursing Note: Pt rates his pain at 10.Draws up l leg w/ sudden l toe pain from gout."It's gonna be  A long night".Paged on-call and obtained another order for colchicine po.wbb

## 2013-08-16 NOTE — Progress Notes (Signed)
Physical Therapy Session Note  Patient Details  Name: [name] MRN: [id number] Date of Birth: 11/10/1940  Today's Date: 08/16/2013 Time: 0930-1010 Time Calculation (min): 40 min  Short Term Goals: Week 1:  PT Short Term Goal 1 (Week 1): Pt will be able to transfer bed <-> w/c with mod A PT Short Term Goal 2 (Week 1): Pt will be able to perform supine <-> sit with min A maintaining back precautions PT Short Term Goal 3 (Week 1): Pt will be able to propel w/c on unit x 100' with S  Skilled Therapeutic Interventions/Progress Updates:   Pt with very limited participation today due to pain, nausea, and overall not feeling well. Propelled w/c on unit initially to attempt therapy in the gym and able to complete squat pivot transfers with S but nausea continued throughout and pt unable to tolerate. Required increased A with sit to supine due to pain in low back on mat and back to bed at end of session. Positioned in sidelying in the bed with all needs in place and RN aware. Pt missed final 20 min of skilled PT.  Therapy Documentation Precautions:  Precautions Precautions: Back;Fall Precaution Comments: Verbalize 3/3 back precautions Required Braces or Orthoses:  (has abdominal binder longstanding; L AFO) Restrictions Weight Bearing Restrictions: No Pain: C/o back pain, nausea, and lightheadedness. RN aware. Ginger ale given and awaiting medication from Pharmacy.  Unable to obtain a BP. Ice packs applied to low back for pain management.  See FIM for current functional status  Therapy/Group: Individual Therapy  Karolee StampsGray, Beckett Maden [facility] 08/16/2013, 10:19 AM

## 2013-08-16 NOTE — Consult Note (Signed)
NEUROPSYCHOLOGICAL DIAGNOSTIC INTERVIEW - [facility]   Mr. Hazle CocaRonald Lonsway is a 73 year old, right-handed, married, Caucasian man, who was seen for a diagnostic clinical interview to evaluate his emotional status in the setting of cauda equine syndrome with postoperative infection.  According to his medical record, [name] underwent lumbar decompression, which was complicated by CSF leak and cauda equine syndrome in November, 2014.  He was readmitted on 07/30/2013 with progressive low back pain.  MRI of the lumbar spine demonstrated epidural fluid collection and some ring-enhancing fluid collections in the soft tissues.  He underwent exploration of the lumbar wound with removal of fibrosis and decompression of the thecal sac.  Postoperatively he experienced lower left extremity pain with edema and pain with weight bearing and was thought to be a good candidate for inpatient rehabilitation.  According to members of the treatment team, [name] disclosed feelings of depression and frustration secondary to worsening physical symptoms with the recent infection and therefore neuropsychology consult was requested.    During the clinical interview, [name] remarked that he has felt as though he is making physical progress and is in less pain so he is feeling better.  He denied feelings of depression currently.  He acknowledged some discouragement over being back in the hospital, but his wife (who was present throughout the session) stated that she believes he is still depressed, because he has not shaved.  Of note, [name] mentioned that he has already made arrangements for his son to bring a razor so that he can shave and he said that it is "bothering him" that he has not shaved lately.  [name] denied anxiety and suicidal ideation and described his current mood as "okay."  He also denied problems with sleep, though he stated that he occasionally  experiences daytime fatigue.    Mr. Sozio's responses to self-report measures of mood symptoms were not suggestive of the presence of clinically significant depression or anxiety, though he did endorse several symptoms of anxiety on the self-report instrument.  He mentioned that he uses exercise and prayer to help cope with mood symptoms when he experiences them.    Cognitively, [name] reported feeling as though his processing speed is slow and he is distractible, which he posited was related to multiple times undergoing anesthesia.    During the session, [name] was unwilling to engage in discussions about any previous feelings of depression, but we spent time discussing the possibility of depressed feelings in the future.  In addition, we worked on practicing deep breathing to help him relax during times when he is feeling more anxiety.  [name] reported being appreciative of the session and he was encouraged to request a follow-up session before his discharge if his low moods return.    DIAGNOSIS: Cauda equina syndrome  Leavy CellaKaren Kerrigan Glendening, Psy.D.  Clinical Neuropsychologist

## 2013-08-16 NOTE — Progress Notes (Signed)
Subjective/Complaints: Low back sore, especially when he sits up. Had a pretty good day yesterday. Still unable to cath self while he's on air overaly. A 12 point review of systems has been performed and if not noted above is otherwise negative.   Objective: Vital Signs: Blood pressure 135/63, pulse 72, temperature 97.6 F (36.4 C), temperature source Oral, resp. rate 18, height 6' (1.829 m), weight 81.602 kg (179 lb 14.4 oz), SpO2 94.00%. Dg Lumbar Spine Complete  08/15/2013   CLINICAL DATA:  Worsening lower back pain. Status post lumbar decompression with infection.  EXAM: LUMBAR SPINE - COMPLETE 4+ VIEW  COMPARISON:  DG ABD 1 VIEW dated 08/08/2013; DG LUMBAR SPINE COMPLETE dated 08/02/2013; MR L SPINE WO/W CM dated 07/29/2013  FINDINGS: The previously noted abscesses are not well characterized on radiograph. The patient is status post decompression along the lower lumbar spine; poor definition of the remaining posterior elements is nonspecific. Lumbar spinal fusion hardware is again noted at L4-S1. The visualized bony foramina are grossly unremarkable, though not well assessed at the sites of surgery.  The visualized bowel gas pattern is grossly unremarkable. Abdominal wall mesh is noted.  IMPRESSION: The remaining posterior elements at the lower lumbar spine are poorly defined on radiograph; this is nonspecific. Status post decompression at the lower lumbar spine, with lumbar spinal fusion at L4-S1. Previously noted epidural abscesses are not well characterized on radiograph. If the patient's symptoms continue to worsen, repeat MRI could be considered for further evaluation.   Electronically Signed   By: Roanna Raider M.D.   On: 08/15/2013 21:20   No results found for this basename: WBC, HGB, HCT, PLT,  in the last 72 hours  Recent Labs  08/15/13 0455  NA 144  K 4.3  CL 105  GLUCOSE 96  BUN 13  CREATININE 1.15  CALCIUM 8.9   CBG (last 3)  No results found for this basename: GLUCAP,  in  the last 72 hours  Wt Readings from Last 3 Encounters:  08/08/13 81.602 kg (179 lb 14.4 oz)  07/30/13 81.602 kg (179 lb 14.4 oz)  07/30/13 81.602 kg (179 lb 14.4 oz)    Physical Exam:  Constitutional: He is oriented to person, place, and time. He appears well-developed and well-nourished.  HENT:  Head: Normocephalic and atraumatic.  Right Ear: External ear normal.  Left Ear: External ear normal.  Eyes: Conjunctivae are normal. Pupils are equal, round, and reactive to light.  Neck: Normal range of motion. Neck supple. No JVD present. No tracheal deviation present. No thyromegaly present.  Cardiovascular: Normal rate, regular rhythm and normal heart sounds.  Respiratory: Effort normal and breath sounds normal. No respiratory distress. He has no wheezes. He has no rales.  GI: Soft. Bowel sounds are normal. He exhibits no distension. There is no tenderness. There is no rebound.  Abdominal wall weakness due to prior surgery.  Musculoskeletal: He exhibits feet are tender, no obvious edema. Ankles are warm to touch and tender Right forearm normal Lymphadenopathy:  He has no cervical adenopathy.  Neurological: He is alert and oriented to person, place, and time.  4+/5 strength bilateral UE's proximal to distal. Normal sensation in bilateral upper extremities Absent sensation left foot but intact above the ankle Left lower extremity strength 3 hip flexion, 3+ knee extension 1/5 ankle plantarflexion 0/5 ankle dorsiflexion Near normal sensation right foot and right lower extremity Right lower extremity strength is 4-/5 hip flexion knee extension, 4 minus ankle dorsiflexion and plantarflexion  Skin: Skin  is warm.. Surgical site well approximated. No drainage, swelling, discoloration  Psychiatric: His speech is normal. Judgment and thought content normal. His mood appears anxious. He is withdrawn. Cognition and memory are normal.    Assessment/Plan: 1. Functional deficits secondary to cauda  equina, wound infection s/p I and D which require 3+ hours per day of interdisciplinary therapy in a comprehensive inpatient rehab setting. Physiatrist is providing close team supervision and 24 hour management of active medical problems listed below. Physiatrist and rehab team continue to assess barriers to discharge/monitor patient progress toward functional and medical goals.   FIM: FIM - Bathing Bathing Steps Patient Completed: Chest;Right Arm;Left Arm;Abdomen Bathing: 2: Max-Patient completes 3-4 [id number] 10 parts or 25-49%  FIM - Upper Body Dressing/Undressing Upper body dressing/undressing steps patient completed: Thread/unthread right sleeve of pullover shirt/dresss;Thread/unthread left sleeve of pullover shirt/dress;Pull shirt over trunk;Put head through opening of pull over shirt/dress Upper body dressing/undressing: 5: Set-up assist to: Obtain clothing/put away FIM - Lower Body Dressing/Undressing Lower body dressing/undressing steps patient completed: Thread/unthread left pants leg Lower body dressing/undressing: 1: Total-Patient completed less than 25% of tasks (STEDY to stand)  FIM - Hotel managerToileting Toileting Assistive Devices: Grab bar or rail for support Toileting: 1: Total-Patient completed zero steps, helper did all 3  FIM - Diplomatic Services operational officerToilet Transfers Toilet Transfers Assistive Devices: Elevated toilet seat;Grab bars Toilet Transfers: 2-From toilet/BSC: Max A (lift and lower assist);2-To toilet/BSC: Max A (lift and lower assist) (STEDY for transfer)  FIM - Bed/Chair Transfer Bed/Chair Transfer Assistive Devices: Arm rests Bed/Chair Transfer: 4: Supine > Sit: Min A (steadying Pt. > 75%/lift 1 leg);5: Bed > Chair or W/C: Supervision (verbal cues/safety issues)  FIM - Locomotion: Wheelchair Distance: 120 (per PT note) Locomotion: Wheelchair: 6: Travels 150 ft or more, turns around, maneuvers to table, bed or toilet, negotiates 3% grade: maneuvers on rugs and over door sills independently FIM  - Locomotion: Ambulation Locomotion: Ambulation Assistive Devices: Parallel bars;Orthosis Ambulation/Gait Assistance: 4: Min assist Locomotion: Ambulation: 1: Travels less than 50 ft with minimal assistance (Pt.>75%)  Comprehension Comprehension Mode: Auditory Comprehension: 7-Follows complex conversation/direction: With no assist  Expression Expression Mode: Verbal Expression: 7-Expresses complex ideas: With no assist  Social Interaction Social Interaction: 7-Interacts appropriately with others - No medications needed.  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  1. DVT Prophylaxis/Anticoagulation: Pharmaceutical: Lovenox  2. Pain Management: Continue neurontin for dysesthesias- .   -added low dose TCA at night  -prednisone taper for gout flare ---will increase back to 20mg  bid given sx and exam  -having a lot of low back pain still--increased oxycontin to 20mg  3. Mood: Team to provide ego support for elevated levels of anxiety. :CSW to follow for evaluation and support.  4. Neuropsych: This patient is capable of making decisions on his own behalf.  5. Neurogenic bladder: Continue Flomax, Cardura and urecholine.    -continue I and O caths. Try to get to eob or toilet to perform---difficult on air mattress.  -having spontaneous voids---will try timed voids to see what results are--still likely to need I and O caths.  6. Neurogenic bowel:  re-establishing bowel program. Suppository daily in am.  7. Anemia: hgb 9.4---track serially 8. Skin: Candidal irritation perineal area, buttocks breakdown---improving  -continue local care, keep skin dry, clean, turn 9. ID: continue vanc/ctx  LOS (Days) 8 A FACE TO FACE EVALUATION  WAS PERFORMED  [name] 08/16/2013 8:23 AM

## 2013-08-16 NOTE — Progress Notes (Signed)
Nursing Note: pt was incont. A huge void in his sleep.Scanned for 574 and in and out cathed for 450 cc. Pt tolerated well.Pt states that he was performing in and out caths prior to admission.Suggested that he begin to perform his caths during the day to be sure that he can do that as his d/c date is approaching.Pt is agreeable.wbb

## 2013-08-16 NOTE — Progress Notes (Signed)
Nursing Note: Pt c/o pain at 10 to l 2nd toe form gout, on prednisone and colchicine started today.Pt medicated for pain w/ little relief.Pt restless and continues to have a lot of pain.A: Paged on-call and obtained order to give .6 mg of colchicine po.wbb

## 2013-08-16 NOTE — Progress Notes (Signed)
Came by. Was asleep . See him in am

## 2013-08-16 NOTE — Progress Notes (Signed)
Physical Therapy Weekly Progress Note  Patient Details  Name: [name] MRN: [id number] Date of Birth: 02-08-1941  Today's Date: 08/16/2013 Time: 7846-9629 Time Calculation (min): 40 min Still with complaints of back pain but improved since using ice packs this afternoon. Focused on bed mobility and OOB transfer with overall close S and then community w/c mobility outdoors on uneven surfaces and off the unit navigating on/off elevator and around various obstacles. Pt overall S/mod I with this activity and uplifted pt's mood.    Patient has met 3 of 3 short term goals.  Pt is making good progress with mobility currently at a close S w/c level with min A times due to increased pain in low back. Back pain and nausea has been main limiting factor and pt with limited standing tolerance/gait attempts due to back pain. Anticipate pt will be ready for d/c at a mod I w/c level (goals were upgraded since evaluation) by end of week if medically stable/pain management under control.   Patient continues to demonstrate the following deficits: decreased strength, decreased balance, ataxia, decreased endurance, pain, impaired sensation, abnormal tone and therefore will continue to benefit from skilled PT intervention to enhance overall performance with activity tolerance, balance, postural control, ability to compensate for deficits, functional use of  right lower extremity and left lower extremity, coordination and knowledge of precautions.  Patient progressing toward long term goals..  Continue plan of care.  PT Short Term Goals Week 1:  PT Short Term Goal 1 (Week 1): Pt will be able to transfer bed <-> w/c with mod A PT Short Term Goal 1 - Progress (Week 1): Met PT Short Term Goal 2 (Week 1): Pt will be able to perform supine <-> sit with min A maintaining back precautions PT Short Term Goal 2 - Progress (Week 1): Met PT Short Term Goal 3 (Week 1): Pt will be able to propel w/c on unit x 100' with  S PT Short Term Goal 3 - Progress (Week 1): Met Week 2:  PT Short Term Goal 1 (Week 2): = LTGs  Skilled Therapeutic Interventions/Progress Updates:  Ambulation/gait training;Balance/vestibular training;Community reintegration;Discharge planning;Disease management/prevention;DME/adaptive equipment instruction;Functional mobility training;Neuromuscular re-education;Pain management;Patient/family education;Psychosocial support;Skin care/wound management;Splinting/orthotics;Stair training;Therapeutic Activities;Therapeutic Exercise;UE/LE Strength taining/ROM;UE/LE Coordination activities;Wheelchair propulsion/positioning   Therapy Documentation Precautions:  Precautions Precautions: Back;Fall Precaution Comments: Verbalize 3/3 back precautions Required Braces or Orthoses:  (has abdominal binder longstanding; L AFO) Restrictions Weight Bearing Restrictions: No  See FIM for current functional status  Therapy/Group: Individual Therapy  Canary Brim [facility] 08/16/2013, 10:24 AM

## 2013-08-16 NOTE — Progress Notes (Signed)
Occupational Therapy Weekly Progress Note  Patient Details  Name: [name] MRN: [id number] Date of Birth: [birth year]  Today's Date: 08/16/2013  Patient has met 4 of 4 short term goals.  Patient is making steady progress on CIR. Patient's biggest barrier at this time is pain. Patient is independent with directing care prn and he is able to read his body well, taking rest breaks appropriately and prn. Patient is able to perform functional transfers with supervision > occasional steady assist. He is able to perform sit<>stands with min-mod assist. Anticipated d/c date =1/23 at a mod I w/c level. Patient's wife plans to be at home with him, she has been present for one OT session; she assisted him PTA prn.  Patient continues to demonstrate the following deficits: decreased independence with management of BLEs, decreased independence with BADLs/IADLs, decreased independence with functional mobility, decreased independence with functional transfers, decreased ROM/strength in BUEs, decreased activity tolerance/endurance, increased pain. Therefore, patient will continue to benefit from skilled OT intervention to enhance overall performance with BADL, iADL and Reduce care partner burden.  Patient progressing toward long term goals..  Continue plan of care.  OT Short Term Goals Week 1:  OT Short Term Goal 1 (Week 1): Patient will perform UB dressing with supervision while seated EOB OT Short Term Goal 1 - Progress (Week 1): Met OT Short Term Goal 2 (Week 1): Patient will complete LB dressing with moderate assistance while seated EOB (excluding shoes, socks only) OT Short Term Goal 2 - Progress (Week 1): Met OT Short Term Goal 3 (Week 1): Patient will complete toilet transfer with minimal assistance OT Short Term Goal 3 - Progress (Week 1): Met OT Short Term Goal 4 (Week 1): Patient will complete sit<>stand with moderate assistance using AD prn OT Short Term Goal 4 - Progress (Week 1): Met  Week  2:  OT Short Term Goal 1 (Week 2): Short Term Goals = Long Term Goals  Skilled Therapeutic Interventions/Progress Updates:  Balance/vestibular training;Community reintegration;Discharge planning;DME/adaptive equipment instruction;Functional mobility training;Neuromuscular re-education;Pain management;Patient/family education;Psychosocial support;Self Care/advanced ADL retraining;Skin care/wound managment;Splinting/orthotics;Therapeutic Exercise;Therapeutic Activities;UE/LE Strength taining/ROM;Wheelchair propulsion/positioning;UE/LE Coordination activities   Precautions:  Precautions Precautions: Back;Fall Precaution Comments: Verbalize 3/3 back precautions Required Braces or Orthoses:  (has abdominal binder longstanding; L AFO) Restrictions Weight Bearing Restrictions: No  See FIM for current functional status  Beryle Bagsby 08/16/2013, 10:09 AM

## 2013-08-16 NOTE — Progress Notes (Signed)
Occupational Therapy Session Note  Patient Details  Name: [name] MRN: [id number] Date of Birth: May 01, 1941  Today's Date: 08/16/2013 Time: 0830-0925 Time Calculation (min): 55 min  Short Term Goals: Week 1:  OT Short Term Goal 1 (Week 1): Patient will perform UB dressing with supervision while seated EOB OT Short Term Goal 1 - Progress (Week 1): Met OT Short Term Goal 2 (Week 1): Patient will complete LB dressing with moderate assistance while seated EOB (excluding shoes, socks only) OT Short Term Goal 2 - Progress (Week 1): Met OT Short Term Goal 3 (Week 1): Patient will complete toilet transfer with minimal assistance OT Short Term Goal 3 - Progress (Week 1): Met OT Short Term Goal 4 (Week 1): Patient will complete sit<>stand with moderate assistance using AD prn OT Short Term Goal 4 - Progress (Week 1): Met  Skilled Therapeutic Interventions/Progress Updates:  Balance/vestibular training;Community reintegration;Discharge planning;DME/adaptive equipment instruction;Functional mobility training;Neuromuscular re-education;Pain management;Patient/family education;Psychosocial support;Self Care/advanced ADL retraining;Skin care/wound managment;Splinting/orthotics;Therapeutic Exercise;Therapeutic Activities;UE/LE Strength taining/ROM;Wheelchair propulsion/positioning;UE/LE Coordination activities   Treatment note:  Patient found seated edge of bed. Therapist assisted patient with donning of bilateral socks. Patient then transferred into w/c and gathered clothing. Patient propelled self from room > ADL apartment. From here, ADL retraining completed at tub/shower level using tub transfer bench. Incontinent BM occurred, requiring total assist for clean-up. Min assist for tub/shower transfer in/out; patient with noted ataxia during movements/transfers. UB/LB bathing completed with minimal assistance (patient required assistance with buttock). W/c transfer from tub and UB/LB dressing  completed, standing at sink to pull pants up to waist(therapist assist with donning of brief). Patient needed assistance with donning of left shoe, he was able to donn right shoe with right AFO. Patient propelled self back to room to wait on next therapist. Patient with complaints of nausea; notified RN.   Precautions:  Precautions Precautions: Back;Fall Precaution Comments: Verbalize 3/3 back precautions Required Braces or Orthoses:  (has abdominal binder longstanding; L AFO) Restrictions Weight Bearing Restrictions: No  See FIM for current functional status  Therapy/Group: Individual Therapy  Darrelyn Morro 08/16/2013, 10:10 AM

## 2013-08-16 NOTE — Progress Notes (Signed)
Physical Therapy Session Note  Patient Details  Name: [name] MRN: [id number] Date of Birth: 11/30/1940  Today's Date: 08/16/2013 Time: [id number] Time Calculation (min): 30 min  Skilled Therapeutic Interventions/Progress Updates:    Pt reports low back pain has improved from this morning however it still limits his mobility. Pt able to tolerate 3 bouts of standing balance with intermittent single UE support during game of horseshoes with another pt, heavy min assist. Sit <> stands x 6 reps, gait forwards/backwards 3 reps x 5 steps  in parallel bars focusing on decreasing rotation and side bending of spine during transitional movements.  Two instances of knee buckling when in standing requiring mod assist to prevent fall - pt reporting knee "gave out."   Therapy Documentation Precautions:  Precautions Precautions: Back;Fall Precaution Comments: Verbalize 3/3 back precautions Required Braces or Orthoses:  (has abdominal binder longstanding; L AFO) Restrictions Weight Bearing Restrictions: No Pain: Pain Assessment Pain Score: 5  Faces Pain Scale: Hurts little more ("we're ok") Pain Location: Back Pain Orientation: Lower Pain Descriptors / Indicators: Aching Pain Onset:  (transitional movements) Pain Intervention(s): Repositioned  See FIM for current functional status  Therapy/Group: Individual Therapy  [name], [name] 08/16/2013, 3:18 PM

## 2013-08-16 NOTE — Progress Notes (Signed)
Recreational Therapy Session Note  Patient Details  Name: [name] MRN: [id number] Date of Birth: 02/07/1941 Today's Date: 08/16/2013  Pain: no c/o  Skilled Therapeutic Interventions/Progress Updates: Session cancelled to bladder issues.  [name] 08/16/2013, 4:19 PM

## 2013-08-17 ENCOUNTER — Inpatient Hospital Stay (HOSPITAL_COMMUNITY): Payer: Medicare Other | Admitting: Physical Therapy

## 2013-08-17 ENCOUNTER — Inpatient Hospital Stay (HOSPITAL_COMMUNITY): Payer: Medicare Other | Admitting: Occupational Therapy

## 2013-08-17 ENCOUNTER — Inpatient Hospital Stay (HOSPITAL_COMMUNITY): Payer: Medicare Other | Admitting: *Deleted

## 2013-08-17 ENCOUNTER — Inpatient Hospital Stay (HOSPITAL_COMMUNITY): Payer: Medicare Other

## 2013-08-17 LAB — URINE CULTURE
Colony Count: NO GROWTH
Culture: NO GROWTH

## 2013-08-17 MED ORDER — SODIUM CHLORIDE 0.9 % IJ SOLN
10.0000 mL | INTRAMUSCULAR | Status: DC | PRN
  Administered 2013-08-17 (×2): 10 mL

## 2013-08-17 MED ORDER — SODIUM CHLORIDE 0.9 % IJ SOLN: 10.0000 mL | Freq: Two times a day (BID) | INTRAMUSCULAR | Status: DC

## 2013-08-17 NOTE — Patient Care Conference (Signed)
Inpatient RehabilitationTeam Conference and Plan of Care Update Date: 08/16/2013   Time: 2:00 PM    Patient Name: [name]      Medical Record Number: [id number]  Date of Birth: 08/28/1940 Sex: Male         Room/Bed: 4W17C/4W17C-01 Payor Info: Payor: MEDICARE / Plan: MEDICARE PART A AND B / Product Type: *No Product type* /    Admitting Diagnosis: Cauda Equine  Admit Date/Time:  08/08/2013  6:12 PM Admission Comments: No comment available   Primary Diagnosis:  <principal problem not specified> Principal Problem: <principal problem not specified>  Patient Active Problem List   Diagnosis Date Noted  . Lumbar degenerative disc disease 08/02/2013  . Post-operative pain 07/29/2013  . Cauda equina syndrome 06/22/2013  . Osteonecrosis 05/05/2013  . Small bowel obstruction 04/17/2013  . Acute renal failure 04/17/2013  . Medial meniscus, posterior horn derangement 02/17/2013  . Knee sprain and strain 02/17/2013  . Trigger point with neck pain 03/18/2012  . Rotator cuff tear arthropathy of right shoulder 09/08/2011  . CAD in native artery 01/09/2011  . Ankle fracture 12/25/2010  . Contusion of shoulder, left 12/25/2010  . PEMPHIGUS VULGARIS 07/02/2010  . MRSA 05/13/2010  . PRURITUS 05/13/2010  . SPINAL STENOSIS, LUMBAR 05/13/2010  . HYPERLIPIDEMIA 04/24/2010  . GASTROESOPHAGEAL REFLUX DISEASE 04/24/2010  . VENTRAL HERNIA, INCISIONAL 04/24/2010  . DEGENERATIVE JOINT DISEASE 04/24/2010  . PYOGENIC ARTHRITIS, SHOULDER REGION 02/04/2010  . HYPERTENSION 01/01/2010  . RUPTURE ROTATOR CUFF 02/19/2009    Expected Discharge Date: Expected Discharge Date: 08/19/13  Team Members Present: Physician leading conference: Dr. Faith RogueZachary Swartz Social Worker Present: Amada JupiterLucy Porschia Willbanks, LCSW Nurse Present: Other (comment) Phylliss Bob(Kom Avegno, RN) PT Present: Karolee StampsAlison Gray, Jerrye BushyPT;Hannah Gillispie, PT OT Present: Edwin CapPatricia Clay, Loistine ChanceT;Karen Pulaski, OT;Frank Deer ParkBarthold, OT PPS Coordinator present : Tora DuckMarie Noel, RN,  CRRN     Current Status/Progress Goal Weekly Team Focus  Medical   having ongoing pain issues, back and with feet/gout  pain mgt,   resume i and o caths, mood mgt, resolve pain problems   Bowel/Bladder   voids incont large amonuts w/ large cath volumes still,BM w/ bowel program daily  managed bowel and bladder programs by d/c  Pt to perform caths during the day.daily supp in am,assess pt perform   Swallow/Nutrition/ Hydration             ADL's   overall supervision>min assist, except total assist for toileting   overall mod I at w/c level  ADL retraining at shower level, functional transfers, toileting aspects, pain management, UB strengthening, overall activity tolerance/endurance, functional mobility, sit<>stands   Mobility   S/min A w/c level; limited gait/standing due to back pain  overall mod I w/c level (goals upgraded)  functional strengthening and endurance, standing tolerance, balance, d/c planning, transfers, gait as able   Communication             Safety/Cognition/ Behavioral Observations  follws sfaety plan,up w/ 1-2 person assist  safe behaviors and safe transfers  assist pt w/ mobility to increase strength   Pain   had alot of gout pain in l toe and alot of back pain,X-ray of spine done  decreased pain  assess pain and provide meds   Skin   coccyx has small open area w/ foam dressing,groin rash,bruising abd,itching  overlay,nystatin to groin rash,benadryl itching,foam dressing to bottom  Continue to keep rash clean and dry, foam dressing to sacrum      *See Care Plan and progress notes for  long and short-term goals.  Barriers to Discharge: pain mgt    Possible Resolutions to Barriers:  see above, prior    Discharge Planning/Teaching Needs:  home with wife and family who can provide 24/7 assistance      Team Discussion:  Not feeling well today: gout, nausea, back pain. However, overall, has been making nice gains and recommending earlier d/c from therapy  perspective.  Focus on reaching supervision w/c level goals.  Must begin allowing pt to self cath here (was doing at home PTA)  Revisions to Treatment Plan:  Change in d/c date to 08/19/13   Continued Need for Acute Rehabilitation Level of Care: The patient requires daily medical management by a physician with specialized training in physical medicine and rehabilitation for the following conditions: Daily direction of a multidisciplinary physical rehabilitation program to ensure safe treatment while eliciting the highest outcome that is of practical value to the patient.: Yes Daily medical management of patient stability for increased activity during participation in an intensive rehabilitation regime.: Yes Daily analysis of laboratory values and/or radiology reports with any subsequent need for medication adjustment of medical intervention for : Neurological problems;Other  Betzaira Mentel 08/17/2013, 1:21 PM

## 2013-08-17 NOTE — Progress Notes (Signed)
Physical Therapy Note  Patient Details  Name: [name] MRN: [id number] Date of Birth: 10/14/1940 Today's Date: 08/17/2013  1500-1525 (25 minutes) individual Pain: no reported pain Focus of treatment: Therapeutic exercise focused on bilateral LE strengthening/ activity tolerance Treatment: Pt finished 5 minutes on Nustep LEs only level 4; transfers wc >< mat scoot to squat/pivot SBA; suspension grid (gravity eliminated) bilateral  hip flexion/extension/ abduction/ adduction . Yarelis Ambrosino,JIM 08/17/2013, 2:59 PM

## 2013-08-17 NOTE — Progress Notes (Signed)
sleptt well. Had a bm this am with urge. Getting suppositories q am. Still no feelings to urinate. Left left with less dysesthesias. To be discharge this Friday?

## 2013-08-17 NOTE — Progress Notes (Signed)
Subjective/Complaints: Up in chair. Feeling better today. Able to sleep. A 12 point review of systems has been performed and if not noted above is otherwise negative.   Objective: Vital Signs: Blood pressure 159/92, pulse 72, temperature 97.4 F (36.3 C), temperature source Oral, resp. rate 18, height 6' (1.829 m), weight 81.602 kg (179 lb 14.4 oz), SpO2 98.00%. Dg Lumbar Spine Complete  08/15/2013   CLINICAL DATA:  Worsening lower back pain. Status post lumbar decompression with infection.  EXAM: LUMBAR SPINE - COMPLETE 4+ VIEW  COMPARISON:  DG ABD 1 VIEW dated 08/08/2013; DG LUMBAR SPINE COMPLETE dated 08/02/2013; MR L SPINE WO/W CM dated 07/29/2013  FINDINGS: The previously noted abscesses are not well characterized on radiograph. The patient is status post decompression along the lower lumbar spine; poor definition of the remaining posterior elements is nonspecific. Lumbar spinal fusion hardware is again noted at L4-S1. The visualized bony foramina are grossly unremarkable, though not well assessed at the sites of surgery.  The visualized bowel gas pattern is grossly unremarkable. Abdominal wall mesh is noted.  IMPRESSION: The remaining posterior elements at the lower lumbar spine are poorly defined on radiograph; this is nonspecific. Status post decompression at the lower lumbar spine, with lumbar spinal fusion at L4-S1. Previously noted epidural abscesses are not well characterized on radiograph. If the patient's symptoms continue to worsen, repeat MRI could be considered for further evaluation.   Electronically Signed   By: Roanna RaiderJeffery  Chang M.D.   On: 08/15/2013 21:20   No results found for this basename: WBC, HGB, HCT, PLT,  in the last 72 hours  Recent Labs  08/15/13 0455  NA 144  K 4.3  CL 105  GLUCOSE 96  BUN 13  CREATININE 1.15  CALCIUM 8.9   CBG (last 3)  No results found for this basename: GLUCAP,  in the last 72 hours  Wt Readings from Last 3 Encounters:  08/08/13 81.602 kg  (179 lb 14.4 oz)  07/30/13 81.602 kg (179 lb 14.4 oz)  07/30/13 81.602 kg (179 lb 14.4 oz)    Physical Exam:  Constitutional: He is oriented to person, place, and time. He appears well-developed and well-nourished.  HENT:  Head: Normocephalic and atraumatic.  Right Ear: External ear normal.  Left Ear: External ear normal.  Eyes: Conjunctivae are normal. Pupils are equal, round, and reactive to light.  Neck: Normal range of motion. Neck supple. No JVD present. No tracheal deviation present. No thyromegaly present.  Cardiovascular: Normal rate, regular rhythm and normal heart sounds.  Respiratory: Effort normal and breath sounds normal. No respiratory distress. He has no wheezes. He has no rales.  GI: Soft. Bowel sounds are normal. He exhibits no distension. There is no tenderness. There is no rebound.  Abdominal wall weakness due to prior surgery.  Musculoskeletal: He exhibits feet are tender, no obvious edema. Ankles are warm to touch and tender Right forearm normal Lymphadenopathy:  He has no cervical adenopathy.  Neurological: He is alert and oriented to person, place, and time.  4+/5 strength bilateral UE's proximal to distal. Normal sensation in bilateral upper extremities Absent sensation left foot but intact above the ankle Left lower extremity strength 3 hip flexion, 3+ knee extension 1/5 ankle plantarflexion 0/5 ankle dorsiflexion Near normal sensation right foot and right lower extremity Right lower extremity strength is 4-/5 hip flexion knee extension, 4 minus ankle dorsiflexion and plantarflexion  Skin: Skin is warm.. Surgical site well approximated. No drainage, swelling, discoloration  Psychiatric: His speech is  normal. Judgment and thought content normal. His mood appears anxious. He is withdrawn. Cognition and memory are normal.    Assessment/Plan: 1. Functional deficits secondary to cauda equina, wound infection s/p I and D which require 3+ hours per day of  interdisciplinary therapy in a comprehensive inpatient rehab setting. Physiatrist is providing close team supervision and 24 hour management of active medical problems listed below. Physiatrist and rehab team continue to assess barriers to discharge/monitor patient progress toward functional and medical goals.   FIM: FIM - Bathing Bathing Steps Patient Completed: Chest;Right Arm;Left Arm;Abdomen;Front perineal area;Right upper leg;Left upper leg;Right lower leg (including foot);Left lower leg (including foot) Bathing: 4: Min-Patient completes 8-9 60f 10 parts or 75+ percent (seated)  FIM - Upper Body Dressing/Undressing Upper body dressing/undressing steps patient completed: Thread/unthread right sleeve of pullover shirt/dresss;Thread/unthread left sleeve of pullover shirt/dress;Pull shirt over trunk;Put head through opening of pull over shirt/dress Upper body dressing/undressing: 5: Set-up assist to: Obtain clothing/put away FIM - Lower Body Dressing/Undressing Lower body dressing/undressing steps patient completed: Thread/unthread right pants leg;Thread/unthread left pants leg;Pull pants up/down;Don/Doff right shoe Lower body dressing/undressing: 4: Min-Patient completed 75 plus % of tasks  FIM - Toileting Toileting steps completed by patient: Adjust clothing prior to toileting;Performs perineal hygiene;Adjust clothing after toileting Toileting Assistive Devices: Grab bar or rail for support Toileting: 4: Steadying assist  FIM - Diplomatic Services operational officer Devices: Elevated toilet seat;Grab bars Toilet Transfers: 4-To toilet/BSC: Min A (steadying Pt. > 75%);4-From toilet/BSC: Min A (steadying Pt. > 75%)  FIM - Bed/Chair Transfer Bed/Chair Transfer Assistive Devices: Arm rests Bed/Chair Transfer: 5: Chair or W/C > Bed: Supervision (verbal cues/safety issues);5: Bed > Chair or W/C: Supervision (verbal cues/safety issues);4: Supine > Sit: Min A (steadying Pt. > 75%/lift 1  leg);3: Sit > Supine: Mod A (lifting assist/Pt. 50-74%/lift 2 legs)  FIM - Locomotion: Wheelchair Distance: 120 (per PT note) Locomotion: Wheelchair: 6: Travels 150 ft or more, turns around, maneuvers to table, bed or toilet, negotiates 3% grade: maneuvers on rugs and over door sills independently FIM - Locomotion: Ambulation Locomotion: Ambulation Assistive Devices: Parallel bars;Orthosis Ambulation/Gait Assistance: 3: Mod assist Locomotion: Ambulation: 1: Travels less than 50 ft with moderate assistance (Pt: 50 - 74%)  Comprehension Comprehension Mode: Auditory Comprehension: 7-Follows complex conversation/direction: With no assist  Expression Expression Mode: Verbal Expression: 7-Expresses complex ideas: With no assist  Social Interaction Social Interaction: 7-Interacts appropriately with others - No medications needed.  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  1. DVT Prophylaxis/Anticoagulation: Pharmaceutical: Lovenox  2. Pain Management:feeling better today. Had a better night.  -Continue neurontin for dysesthesias- .   -added low dose TCA at night  -prednisone taper for gout flare ---will increase back to 20mg  bid given sx and exam  -oxycontin cr 20mg  q12 3. Mood: Team to provide ego support for elevated levels of anxiety. :CSW to follow for evaluation and support.  4. Neuropsych: This patient is capable of making decisions on his own behalf.  5. Neurogenic bladder: Continue Flomax, Cardura and urecholine.    -continue I and O caths. Try to get to eob or toilet to perform---difficult on air mattress.  -having spontaneous voids---will try timed voids to see what results are--still likely to need I and O caths.  6. Neurogenic bowel:  re-establishing bowel program.  Suppository daily in am.  7.  Anemia: hgb 9.4---track serially 8. Skin: Candidal irritation perineal area, buttocks breakdown---improving  -continue local care, keep skin dry, clean, turn 9. ID: continue vanc/ctx  LOS (Days) 9 A FACE TO FACE EVALUATION WAS PERFORMED  Janee Ureste T 08/17/2013 8:26 AM

## 2013-08-17 NOTE — Progress Notes (Signed)
Social Work Patient ID: Foy [place], male   DOB: 1940/09/20, 73 y.o.   MRN: [id number]  Met with pt yesterday afternoon following team conference.  Pt aware and agreeable with change in d/c date to 08/19/13 but aware this will hinge on medical stability.  Feeling better today and remains very motivated.  Have alert [facility] to planned d/c end of week.  They will resume their [facility] services and management of IV abx.  Continue to follow.  Malorie Bigford, LCSW

## 2013-08-17 NOTE — Progress Notes (Signed)
Physical Therapy Session Note  Patient Details  Name: [name] MRN: [id number] Date of Birth: 07/27/1941  Today's Date: 08/17/2013 Time: 0930-1030 Time Calculation (min): 60 min    Skilled Therapeutic Interventions/Progress Updates:    Session focused on functional transfers including uneven transfers and simulated car transfer with S overall (squat pivot today instead of scoot transfer), neuro re-ed for BLE in parallel bars to work on dynamic standing balance and motor control (toe taps with alternating LE's to hit a target (2# ankle weight used on LLE for proprioceptive input), gait in parallel bars with steady A x 8' forward and then backwards x 2 reps each with seated rest break; improved technique with sit to stands with steady A/close S, seated LE strengthening on Kinetron with 1 min intervals x 5 reps, and dynamic sitting balance activity for Wii bowling seated edge of mat. Stretching to bilateral hip flexors on mat at end of session to decreased tightness and pain relief x 3 reps x 30 second hold.  Therapy Documentation Precautions:  Precautions Precautions: Back;Fall Precaution Comments: Verbalize 3/3 back precautions Required Braces or Orthoses:  (has abdominal binder longstanding; L AFO) Restrictions Weight Bearing Restrictions: No Pain: Premedicated for pain reporting it is better today.   See FIM for current functional status  Therapy/Group: Individual Therapy  [name], [name] 08/17/2013, 1:30 PM

## 2013-08-17 NOTE — Progress Notes (Signed)
Occupational Therapy Session Notes  Patient Details  Name: VICTORIO CREEDEN MRN: [id number] Date of Birth: 01-Nov-1940  Today's Date: 08/17/2013  Short Term Goals: Week 1:  OT Short Term Goal 1 (Week 1): Patient will perform UB dressing with supervision while seated EOB OT Short Term Goal 1 - Progress (Week 1): Met OT Short Term Goal 2 (Week 1): Patient will complete LB dressing with moderate assistance while seated EOB (excluding shoes, socks only) OT Short Term Goal 2 - Progress (Week 1): Met OT Short Term Goal 3 (Week 1): Patient will complete toilet transfer with minimal assistance OT Short Term Goal 3 - Progress (Week 1): Met OT Short Term Goal 4 (Week 1): Patient will complete sit<>stand with moderate assistance using AD prn OT Short Term Goal 4 - Progress (Week 1): Met  Week 2:  OT Short Term Goal 1 (Week 2): Short Term Goals = Long Term Goals  Skilled Therapeutic Interventions/Progress Updates:   Session #1 [phone] - 60 Minutes Individual Therapy No complaints of pain Upon entering room, patient found seated in w/c and eager to work with therapist. Patient gathered necessary items and propelled self from room > ADL apartment. Patient completed UB/LB bathing from a shower level in seated position. Min assist for transfer <>tub bench. Dressing then completed from w/c level, sit<>stand for pants. Patient then propelled self > therapy gym for BUE strengthening exercise using ergometer for 10 minutes. At end of session, patient propelled self back to room at mod I level. Patient continues to present with ataxic movements, during this am session they were decreased.   Session #2 1420-1500 - 40 Minutes Individual Therapy No complaints of pain Patient found supine in bed. Patient engaged in bed mobility and transferred EOB>w/c with distant supervision. Patient propelled self > therapy gym and focused skilled intervention on sit<>stands, dynamic standing balance/tolerance/endurance, and  overall activity tolerance/endurance. Patient tends to break back precautions during some squat pivot transfers (twisting to maintain balance on transfer surface), therefore therapist discussed ways to perform transfer while maintaining precautions. After exercise, patient transferred onto NuStep machine for BLE strengthening. At end of session, left patient on NuStep with PT present for next therapy session.  Precautions:  Precautions Precautions: Back;Fall Precaution Comments: Verbalize 3/3 back precautions Required Braces or Orthoses:  (has abdominal binder longstanding; L AFO) Restrictions Weight Bearing Restrictions: No  See FIM for current functional status  Yohana Bartha 08/17/2013, 7:25 AM

## 2013-08-17 NOTE — Progress Notes (Signed)
Social Work Patient ID: [name], male   DOB: April 09, 1941, 73 y.o.   MRN: [id number]  Jake Jupiter, LCSW Social Worker Signed  Patient Care Conference Service date: 08/17/2013 1:21 PM  Inpatient RehabilitationTeam Conference and Plan of Care Update Date: 08/16/2013   Time: 2:00 PM     Patient Name: [name]       Medical Record Number: [id number]   Date of Birth: [birth year] Sex: Male         Room/Bed: 4W17C/4W17C-01 Payor Info: Payor: MEDICARE / Plan: MEDICARE PART A AND B / Product Type: *No Product type* /   Admitting Diagnosis: Cauda Equine   Admit Date/Time:  08/08/2013  6:12 PM Admission Comments: No comment available   Primary Diagnosis:  <principal problem not specified> Principal Problem: <principal problem not specified>    Patient Active Problem List     Diagnosis  Date Noted   .  Lumbar degenerative disc disease  08/02/2013   .  Post-operative pain  07/29/2013   .  Cauda equina syndrome  06/22/2013   .  Osteonecrosis  05/05/2013   .  Small bowel obstruction  04/17/2013   .  Acute renal failure  04/17/2013   .  Medial meniscus, posterior horn derangement  02/17/2013   .  Knee sprain and strain  02/17/2013   .  Trigger point with neck pain  03/18/2012   .  Rotator cuff tear arthropathy of right shoulder  09/08/2011   .  CAD in native artery  01/09/2011   .  Ankle fracture  12/25/2010   .  Contusion of shoulder, left  12/25/2010   .  PEMPHIGUS VULGARIS  07/02/2010   .  MRSA  05/13/2010   .  PRURITUS  05/13/2010   .  SPINAL STENOSIS, LUMBAR  05/13/2010   .  HYPERLIPIDEMIA  04/24/2010   .  GASTROESOPHAGEAL REFLUX DISEASE  04/24/2010   .  VENTRAL HERNIA, INCISIONAL  04/24/2010   .  DEGENERATIVE JOINT DISEASE  04/24/2010   .  PYOGENIC ARTHRITIS, SHOULDER REGION  02/04/2010   .  HYPERTENSION  01/01/2010   .  RUPTURE ROTATOR CUFF  02/19/2009     Expected Discharge Date: Expected Discharge Date: 08/19/13  Team Members Present: Physician leading conference:  Dr. Faith Rogue Social Worker Present: Jake Jupiter, LCSW Nurse Present: Other (comment) Phylliss Bob, RN) PT Present: Karolee Stamps, Jerrye Bushy, PT OT Present: Edwin Cap, Loistine Chance, OT;Frank [place], OT PPS Coordinator present : Tora Duck, RN, CRRN        Current Status/Progress  Goal  Weekly Team Focus   Medical     having ongoing pain issues, back and with feet/gout  pain mgt,   resume i and o caths, mood mgt, resolve pain problems   Bowel/Bladder     voids incont large amonuts w/ large cath volumes still,BM w/ bowel program daily  managed bowel and bladder programs by d/c  Pt to perform caths during the day.daily supp in am,assess pt perform   Swallow/Nutrition/ Hydration            ADL's     overall supervision>min assist, except total assist for toileting   overall mod I at w/c level  ADL retraining at shower level, functional transfers, toileting aspects, pain management, UB strengthening, overall activity tolerance/endurance, functional mobility, sit<>stands   Mobility     S/min A w/c level; limited gait/standing due to back pain  overall mod I w/c level (goals upgraded)  functional strengthening  and endurance, standing tolerance, balance, d/c planning, transfers, gait as able   Communication            Safety/Cognition/ Behavioral Observations    follws sfaety plan,up w/ 1-2 person assist  safe behaviors and safe transfers  assist pt w/ mobility to increase strength   Pain     had alot of gout pain in l toe and alot of back pain,X-ray of spine done  decreased pain  assess pain and provide meds   Skin     coccyx has small open area w/ foam dressing,groin rash,bruising abd,itching  overlay,nystatin to groin rash,benadryl itching,foam dressing to bottom  Continue to keep rash clean and dry, foam dressing to sacrum     *See Care Plan and progress notes for long and short-term goals.    Barriers to Discharge:  pain mgt      Possible Resolutions to  Barriers:    see above, prior      Discharge Planning/Teaching Needs:    home with wife and family who can provide 24/7 assistance      Team Discussion:    Not feeling well today: gout, nausea, back pain. However, overall, has been making nice gains and recommending earlier d/c from therapy perspective.  Focus on reaching supervision w/c level goals.  Must begin allowing pt to self cath here (was doing at home PTA)   Revisions to Treatment Plan:    Change in d/c date to 08/19/13    Continued Need for Acute Rehabilitation Level of Care: The patient requires daily medical management by a physician with specialized training in physical medicine and rehabilitation for the following conditions: Daily direction of a multidisciplinary physical rehabilitation program to ensure safe treatment while eliciting the highest outcome that is of practical value to the patient.: Yes Daily medical management of patient stability for increased activity during participation in an intensive rehabilitation regime.: Yes Daily analysis of laboratory values and/or radiology reports with any subsequent need for medication adjustment of medical intervention for : Neurological problems;Other  Lamere Lightner 08/17/2013, 1:21 PM         Jake Jupiter, LCSW Social Worker Signed  Patient Care Conference Service date: 08/09/2013 6:00 PM  Inpatient RehabilitationTeam Conference and Plan of Care Update Date: 08/09/2013   Time: 2:05 PM     Patient Name: [name]       Medical Record Number: [id number]   Date of Birth: 04/30/1941 Sex: Male         Room/Bed: 4W17C/4W17C-01 Payor Info: Payor: MEDICARE / Plan: MEDICARE PART A AND B / Product Type: *No Product type* /   Admitting Diagnosis: Cauda Equine   Admit Date/Time:  08/08/2013  6:12 PM Admission Comments: No comment available   Primary Diagnosis:  <principal problem not specified> Principal Problem: <principal problem not specified>    Patient Active Problem List      Diagnosis  Date Noted   .  Lumbar degenerative disc disease  08/02/2013   .  Post-operative pain  07/29/2013   .  Cauda equina syndrome  06/22/2013   .  Osteonecrosis  05/05/2013   .  Small bowel obstruction  04/17/2013   .  Acute renal failure  04/17/2013   .  Medial meniscus, posterior horn derangement  02/17/2013   .  Knee sprain and strain  02/17/2013   .  Trigger point with neck pain  03/18/2012   .  Rotator cuff tear arthropathy of right shoulder  09/08/2011   .  CAD in native artery  01/09/2011   .  Ankle fracture  12/25/2010   .  Contusion of shoulder, left  12/25/2010   .  PEMPHIGUS VULGARIS  07/02/2010   .  MRSA  05/13/2010   .  PRURITUS  05/13/2010   .  SPINAL STENOSIS, LUMBAR  05/13/2010   .  HYPERLIPIDEMIA  04/24/2010   .  GASTROESOPHAGEAL REFLUX DISEASE  04/24/2010   .  VENTRAL HERNIA, INCISIONAL  04/24/2010   .  DEGENERATIVE JOINT DISEASE  04/24/2010   .  PYOGENIC ARTHRITIS, SHOULDER REGION  02/04/2010   .  HYPERTENSION  01/01/2010   .  RUPTURE ROTATOR CUFF  02/19/2009     Expected Discharge Date: Expected Discharge Date: 08/30/13  Team Members Present: Physician leading conference: Dr. Faith RogueZachary Swartz Social Worker Present: Jake JupiterLucy Chia Rock, LCSW Nurse Present: Leland Johnshaih Rcom, RN PT Present: Karolee StampsAlison Gray, Jerrye BushyPT;Hannah Gillispie, PT OT Present: Edwin CapPatricia Clay, Loistine ChanceT;Karen [place], OT PPS Coordinator present : Tora DuckMarie Noel, RN, CRRN        Current Status/Progress  Goal  Weekly Team Focus   Medical     cauda equina, pain issues related to gout. wound i and d, abx rx  improve activity tolerance to allow participation in therapy.,   rx gout, pain, mood rx and support   Bowel/Bladder     LBM 08/08/13.  Foley catheter intact due to acute retention.  Foley catheter maintainance.  Monitor BM pattern.  Medicate prn     Swallow/Nutrition/ Hydration            ADL's     Min for UB ADLs, total for LB ADLs, Mod assist for transfers (scoot)  overall mod I at w/c level  ADL retraining  from w/c level, functional transfers, pain management, UB strengthening, overall activity tolerance/endurance, functional mobility   Mobility     mod to max A overall  min A transfers, mod I w/c mobility  neuro re-ed for balance and mobility, functional strengthening, endurance, pain management, activity tolerance   Communication            Safety/Cognition/ Behavioral Observations    Alarm in bed/chair.  2+ max assist with transfers.  Min assist with transfers.  Monitor for unsafe behavior; keep free from injury.   Pain     PRN: Robaxin 1000 mg IV q 6 hrs for muscle spasms; Oxy IR 10 mg q 4 hrs pain; Ultram 50 mg q 6 hrs pain  Pain level of <3  Monitor for pain q shift and prn; medicate appropriately   Skin     large amt bruising BUE; steri strip intact to low back incision; rash to rt forearm, scrotum, bil groin  no additional skin issues  Monitor for signs of infection; continue Nystatin for rashes    Rehab Goals Patient on target to meet rehab goals: Yes *See Care Plan and progress notes for long and short-term goals.    Barriers to Discharge:  pain control, neurological deficits, mood      Possible Resolutions to Barriers:    medical mgt, pain control, egosupport      Discharge Planning/Teaching Needs:    home with wife and family who can provide assistance      Team Discussion:    New eval for this gentleman who is familiar to [facility] (here 12/14).  Not feeling well on this initial eval day - clearly wanted to participate, however, just could not tolerate much.  Mood is more depressed than previous stay.  Hoping  to reach min assist w/c goals if possible.   Revisions to Treatment Plan:    None    Continued Need for Acute Rehabilitation Level of Care: The patient requires daily medical management by a physician with specialized training in physical medicine and rehabilitation for the following conditions: Daily direction of a multidisciplinary physical rehabilitation program to  ensure safe treatment while eliciting the highest outcome that is of practical value to the patient.: Yes Daily medical management of patient stability for increased activity during participation in an intensive rehabilitation regime.: Yes Daily analysis of laboratory values and/or radiology reports with any subsequent need for medication adjustment of medical intervention for : Post surgical problems;Neurological problems;Other  Iain Sawchuk 08/09/2013, 6:01 PM

## 2013-08-18 ENCOUNTER — Inpatient Hospital Stay (HOSPITAL_COMMUNITY): Payer: Medicare Other | Admitting: Occupational Therapy

## 2013-08-18 ENCOUNTER — Inpatient Hospital Stay (HOSPITAL_COMMUNITY): Payer: Medicare Other

## 2013-08-18 LAB — CBC WITH DIFFERENTIAL/PLATELET
BASOS ABS: 0.1 10*3/uL (ref 0.0–0.1)
Basophils Relative: 1 % (ref 0–1)
Eosinophils Absolute: 0.9 10*3/uL — ABNORMAL HIGH (ref 0.0–0.7)
Eosinophils Relative: 12 % — ABNORMAL HIGH (ref 0–5)
HEMATOCRIT: 33.1 % — AB (ref 39.0–52.0)
Hemoglobin: 10.9 g/dL — ABNORMAL LOW (ref 13.0–17.0)
Lymphocytes Relative: 19 % (ref 12–46)
Lymphs Abs: 1.5 10*3/uL (ref 0.7–4.0)
MCH: 29.3 pg (ref 26.0–34.0)
MCHC: 32.9 g/dL (ref 30.0–36.0)
MCV: 89 fL (ref 78.0–100.0)
Monocytes Absolute: 0.9 10*3/uL (ref 0.1–1.0)
Monocytes Relative: 12 % (ref 3–12)
NEUTROS ABS: 4.2 10*3/uL (ref 1.7–7.7)
NEUTROS PCT: 56 % (ref 43–77)
Platelets: 368 10*3/uL (ref 150–400)
RBC: 3.72 MIL/uL — ABNORMAL LOW (ref 4.22–5.81)
RDW: 15.4 % (ref 11.5–15.5)
WBC: 7.5 10*3/uL (ref 4.0–10.5)

## 2013-08-18 LAB — BASIC METABOLIC PANEL
BUN: 16 mg/dL (ref 6–23)
CHLORIDE: 101 meq/L (ref 96–112)
CO2: 26 mEq/L (ref 19–32)
CREATININE: 1.46 mg/dL — AB (ref 0.50–1.35)
Calcium: 9.1 mg/dL (ref 8.4–10.5)
GFR, EST AFRICAN AMERICAN: 54 mL/min — AB (ref >90)
GFR, EST NON AFRICAN AMERICAN: 47 mL/min — AB (ref >90)
Glucose, Bld: 143 mg/dL — ABNORMAL HIGH (ref 70–99)
Potassium: 4.5 mEq/L (ref 3.7–5.3)
Sodium: 141 mEq/L (ref 137–147)

## 2013-08-18 LAB — VANCOMYCIN, TROUGH: Vancomycin Tr: 19.3 ug/mL (ref 10.0–20.0)

## 2013-08-18 MED ORDER — DOXAZOSIN MESYLATE 8 MG PO TABS
8.0000 mg | ORAL_TABLET | Freq: Every day | ORAL | Status: DC
  Administered 2013-08-19: 8 mg via ORAL
  Filled 2013-08-18 (×4): qty 1

## 2013-08-18 NOTE — Progress Notes (Signed)
Reviewed and in agreement with treatment provided.  

## 2013-08-18 NOTE — Progress Notes (Signed)
Reviewed and in agreement with discharge assessment and treatment provided.

## 2013-08-18 NOTE — Progress Notes (Signed)
Recreational Therapy Discharge Summary Patient Details  Name: [name] MRN: [id number] Date of Birth: [birth year] Today's Date: 08/18/2013  Long term goals set: 1  Long term goals met: 1  Comments on progress toward goals: Pt has made great progress toward goal and is ready for discharge home tomorrow at Mod I w/c level.  Education provided on safety, community pursuits & use of leisure time post discharge Reasons for discharge: discharge from hospital Patient/family agrees with progress made and goals achieved: Yes  Carsten Carstarphen 08/18/2013, 3:34 PM

## 2013-08-18 NOTE — Progress Notes (Signed)
Physical Therapy Discharge Summary  Patient Details  Name: [name] MRN: [id number] Date of Birth: 06-10-1941  Today's Date: 08/18/2013  Patient has met 7 of 7 long term goals due to improved activity tolerance, improved balance, improved postural control, increased strength, increased range of motion, decreased pain, ability to compensate for deficits and functional use of  right lower extremity and left lower extremity.  Patient to discharge at a wheelchair level Modified Independent.       Recommendation:  Patient will benefit from ongoing skilled PT services in home health setting to continue to advance safe functional mobility, address ongoing impairments in LE strength bilaterally, standing tolerance, gait, dynamic sitting balance, and static and dynamic standing balance, and minimize fall risk.  Equipment: No equipment provided. Pt already owns RW and w/c.   Reasons for discharge: treatment goals met and discharge from hospital  Patient/family agrees with progress made and goals achieved: Yes  PT Discharge Precautions/Restrictions Precautions Precautions: Back;Fall Precaution Comments: Verbalize and adheres to 3/3 back precautions Required Braces or Orthoses:  (patient wears binder and AFO(longstanding)) Restrictions Weight Bearing Restrictions: No  Cognition Overall Cognitive Status: Within Functional Limits for tasks assessed Arousal/Alertness: Awake/alert Orientation Level: Oriented X4 Memory: Appears intact Awareness: Appears intact Problem Solving: Appears intact Safety/Judgment: Appears intact Sensation Sensation Light Touch: Impaired Detail (Impaired plantar surface of LLE) Light Touch Impaired Details: Impaired LLE Coordination Gross Motor Movements are Fluid and Coordinated: Yes (BUEs) Fine Motor Movements are Fluid and Coordinated: Yes (BUEs) Motor  Motor Motor: Ataxia   Trunk/Postural Assessment  Cervical Assessment Cervical Assessment:  Within Functional Limits Thoracic Assessment Thoracic Assessment: Within Functional Limits Lumbar Assessment Lumbar Assessment: Exceptions to [facility] ((back precautions)) Postural Control Postural Control: Deficits on evaluation ((decreased standing balance))  Balance Balance Balance Assessed: Yes Static Sitting Balance Static Sitting - Level of Assistance: 6: Modified independent (Device/Increase time) Dynamic Sitting Balance Dynamic Sitting - Level of Assistance: 6: Modified independent (Device/Increase time) Static Standing Balance Static Standing - Level of Assistance: 5: Stand by assistance with the use of a RW Dynamic Standing Balance Dynamic Standing - Level of Assistance: 5: Stand by assistance with the use of a RW Extremity Assessment    RLE Assessment RLE Assessment: Exceptions to [facility] RLE Strength RLE Overall Strength Comments: MMT scores: hip flexion 3+/5, knee extension 4/5, ankle plantarflexion and dorsiflexion 4/5 LLE Assessment LLE Assessment: Exceptions to [facility] LLE Strength LLE Overall Strength Comments: MMT scores: 4/5 hip flexion, 4/5 knee extension, foot drop with L AFO  See FIM for current functional status  Cathlene Gardella 08/18/2013, 10:46 AM

## 2013-08-18 NOTE — Progress Notes (Signed)
Subjective/Complaints: In better spirits. Having urinary kickoff and some incontinence. Usually not able to get to the bathroom in time. A 12 point review of systems has been performed and if not noted above is otherwise negative.   Objective: Vital Signs: Blood pressure 146/66, pulse 68, temperature 97.5 F (36.4 C), temperature source Oral, resp. rate 16, height 6' (1.829 m), weight 81.602 kg (179 lb 14.4 oz), SpO2 97.00%. No results found. No results found for this basename: WBC, HGB, HCT, PLT,  in the last 72 hours No results found for this basename: NA, K, CL, CO, GLUCOSE, BUN, CREATININE, CALCIUM,  in the last 72 hours CBG (last 3)  No results found for this basename: GLUCAP,  in the last 72 hours  Wt Readings from Last 3 Encounters:  08/08/13 81.602 kg (179 lb 14.4 oz)  07/30/13 81.602 kg (179 lb 14.4 oz)  07/30/13 81.602 kg (179 lb 14.4 oz)    Physical Exam:  Constitutional: He is oriented to person, place, and time. He appears well-developed and well-nourished.  HENT:  Head: Normocephalic and atraumatic.  Right Ear: External ear normal.  Left Ear: External ear normal.  Eyes: Conjunctivae are normal. Pupils are equal, round, and reactive to light.  Neck: Normal range of motion. Neck supple. No JVD present. No tracheal deviation present. No thyromegaly present.  Cardiovascular: Normal rate, regular rhythm and normal heart sounds.  Respiratory: Effort normal and breath sounds normal. No respiratory distress. He has no wheezes. He has no rales.  GI: Soft. Bowel sounds are normal. He exhibits no distension. There is no tenderness. There is no rebound.  Abdominal wall weakness due to prior surgery.  Musculoskeletal: He exhibits feet are tender, no obvious edema. Ankles are warm to touch and tender Right forearm normal Lymphadenopathy:  He has no cervical adenopathy.  Neurological: He is alert and oriented to person, place, and time.  4+/5 strength bilateral UE's proximal  to distal. Normal sensation in bilateral upper extremities Absent sensation left foot but intact above the ankle Left lower extremity strength 3 hip flexion, 3+ knee extension 1/5 ankle plantarflexion 0/5 ankle dorsiflexion Near normal sensation right foot and right lower extremity Right lower extremity strength is 4-/5 hip flexion knee extension, 4 minus ankle dorsiflexion and plantarflexion  Skin: Skin is warm.. Surgical site well approximated. No drainage, swelling, discoloration  Psychiatric: His speech is normal. Judgment and thought content normal. His mood appears anxious. He is withdrawn. Cognition and memory are normal.    Assessment/Plan: 1. Functional deficits secondary to cauda equina, wound infection s/p I and D which require 3+ hours per day of interdisciplinary therapy in a comprehensive inpatient rehab setting. Physiatrist is providing close team supervision and 24 hour management of active medical problems listed below. Physiatrist and rehab team continue to assess barriers to discharge/monitor patient progress toward functional and medical goals.   FIM: FIM - Bathing Bathing Steps Patient Completed: Chest;Right Arm;Left Arm;Abdomen;Front perineal area;Right upper leg;Left upper leg;Right lower leg (including foot);Left lower leg (including foot);Buttocks Bathing: 6: Assistive device (Comment)  FIM - Upper Body Dressing/Undressing Upper body dressing/undressing steps patient completed: Thread/unthread right sleeve of pullover shirt/dresss;Thread/unthread left sleeve of pullover shirt/dress;Pull shirt over trunk;Put head through opening of pull over shirt/dress Upper body dressing/undressing: 7: Complete Independence: No helper FIM - Lower Body Dressing/Undressing Lower body dressing/undressing steps patient completed: Thread/unthread right pants leg;Thread/unthread left pants leg;Pull pants up/down;Don/Doff right shoe;Don/Doff left shoe;Fasten/unfasten  pants;Fasten/unfasten right shoe;Fasten/unfasten left shoe Lower body dressing/undressing: 6: Assistive device (Comment)  FIM - Toileting Toileting steps completed by patient: Adjust clothing prior to toileting;Performs perineal hygiene;Adjust clothing after toileting Toileting Assistive Devices: Grab bar or rail for support Toileting: 6: Assistive device: No helper  FIM - Diplomatic Services operational officer Devices: Elevated toilet seat;Grab bars Toilet Transfers: 6-Assistive device: No helper  FIM - Banker Devices: Arm rests Bed/Chair Transfer: 6: Supine > Sit: No assist;4: Sit > Supine: Min A (steadying pt. > 75%/lift 1 leg);5: Bed > Chair or W/C: Supervision (verbal cues/safety issues);5: Chair or W/C > Bed: Supervision (verbal cues/safety issues)  FIM - Locomotion: Wheelchair Distance: 120 (per PT note) Locomotion: Wheelchair: 6: Travels 150 ft or more, turns around, maneuvers to table, bed or toilet, negotiates 3% grade: maneuvers on rugs and over door sills independently FIM - Locomotion: Ambulation Locomotion: Ambulation Assistive Devices: Parallel bars;Orthosis Ambulation/Gait Assistance: 4: Min guard Locomotion: Ambulation: 1: Travels less than 50 ft with minimal assistance (Pt.>75%)  Comprehension Comprehension Mode: Auditory Comprehension: 7-Follows complex conversation/direction: With no assist  Expression Expression Mode: Verbal Expression: 7-Expresses complex ideas: With no assist  Social Interaction Social Interaction: 7-Interacts appropriately with others - No medications needed.  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  1. DVT Prophylaxis/Anticoagulation: Pharmaceutical: Lovenox  2. Pain Management:feeling  better today. Seems to be doing much better  -Continue neurontin for dysesthesias- .   -added low dose TCA at night  -prednisone taper for gout flare ---will increase back to 20mg  bid given sx and exam  -oxycontin cr 20mg  q12 3. Mood: Team to provide ego support for elevated levels of anxiety. :CSW to follow for evaluation and support.  4. Neuropsych: This patient is capable of making decisions on his own behalf.  5. Neurogenic bladder: Continue Flomax, Cardura, increase cardura back to 8mg   -outpt uro follow up  -continue I and O caths.    -having spontaneous voids, usually urgent and incontinent. Will dc urecholine  6. Neurogenic bowel:  re-establishing bowel program. Suppository daily in am.  7. Anemia: hgb 9.4---track serially 8. Skin: Candidal irritation perineal area, buttocks breakdown---improving  -continue local care, keep skin dry, clean, turn 9. ID: continue vanc/ctx  LOS (Days) 10 A FACE TO FACE EVALUATION WAS PERFORMED  Cloyce Paterson T 08/18/2013 8:38 AM

## 2013-08-18 NOTE — Progress Notes (Signed)
Patient preformed self-cath with a 16 french 1230 pm with assistance of nurse tech. Per nurse tech 4 small blood clots noted in urine, and when catheter removed a small amount of blood noted. Notified Dan A., PA stated continue using coude catheters and monitor results. Made patient aware. Will continue to monitor.

## 2013-08-18 NOTE — Progress Notes (Signed)
Recreational Therapy Session Note  Patient Details  Name: Ignacia MarvelRonald W Dacey MRN: [id number] Date of Birth: 01/08/1941 Today's Date: 08/18/2013  Pain: no c/o Skilled Therapeutic Interventions/Progress Updates:  Pt propelled w/c throughout hospital >1000' with Mod I negotiating obstacles.  Pt stood with min-mod assist to fill his yogurt cup with limited UE support on counter.  Discharge planning with pt including use of leisure time & community pursuits.  Tiawana Forgy 08/18/2013, 3:24 PM

## 2013-08-18 NOTE — Progress Notes (Signed)
Stable.continue with pt. Going home tomorrow. i will see him in my office in 4 to 6 weeks or before prn. Rehab is getting an appointment to see GU in [place].
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
## 2013-08-18 NOTE — Progress Notes (Signed)
Physical Therapy Session Note  Patient Details  Name: [name] MRN: [id number] Date of Birth: 01/09/1941  Today's Date: 08/18/2013 Time: 1340-1430 Time Calculation (min): 50 min  Short Term Goals: Week 2:  PT Short Term Goal 1 (Week 2): = LTGs  Skilled Therapeutic Interventions/Progress Updates:   Therapeutic activity to negotiate stairs with focus on coordination/control and strengthening of LE's up/down 3 steps (4") with mod A and navigate with w/c downstairs in Cafeteria to get frozen yogurt (standing at machine to dispense with +2 A due to no UE support), transfer to a regular chair, and education on community reintegration/activity modification recommendations.   Therapy Documentation Precautions:  Precautions Precautions: Back;Fall Precaution Comments: Verbalize and adheres to 3/3 back precautions Required Braces or Orthoses:  (patient wears binder and AFO(longstanding)) Restrictions Weight Bearing Restrictions: No Pain:  No complaints.  See FIM for current functional status  Therapy/Group: Individual Therapy and Co-Treatment with Recreational Therapy  Karolee StampsGray, Sabina Beavers [facility] 08/18/2013, 3:55 PM

## 2013-08-18 NOTE — Progress Notes (Signed)
Patient preformed self-cath with coude catheter. Patient with small amount of sediment in urine. No complaints of discomfort or difficulty with insertion. Will continue to monitor patient.

## 2013-08-18 NOTE — Progress Notes (Signed)
Physical Therapy Session Note  Patient Details  Name: [name] MRN: [id number] Date of Birth: 09/26/1940  Today's Date: 08/18/2013 Time: 0830-0927 Time Calculation (min): 57 min  Short Term Goals: Week 2:  PT Short Term Goal 1 (Week 2): = LTGs  Skilled Therapeutic Interventions/Progress Updates:    Session consisted of pt performing w/c propulsion to and from his room to the gym with Mod I and w/c-car squat pivot transfer with Mod I, a w/c-bed squat pivot transfer with Mod I, bed mobility with Mod I, gait on the parallel bars 3 x 10' with S with pt showing decreased hip flexion of both LE's and drop foot of the L foot (pt wearing L AFO) with patient also turning within the parallel bars during this activity, sit to stand activity where patient stood up and reached slightly outside his BOS in order to grab a horseshoe and then tossed it towards a target sat back down and repeated the task with S, and a dynamic sitting balance activity where pt reached slightly outside his BOS for a horseshoe and tossed it towards a target with Mod I in order to improve his functional independence. Pt continues to make good progress when working with PT.   Therapy Documentation Precautions:  Precautions Precautions: Back;Fall Precaution Comments: Verbalize and adheres to 3/3 back precautions Required Braces or Orthoses:  (patient wears binder and AFO(longstanding)) Restrictions Weight Bearing Restrictions: No Pain:  Pt complained of mild pain in lower back. Pt was repositioned in order to alleviate this pain.   See FIM for current functional status  Therapy/Group: Individual Therapy  [name] 08/18/2013, 10:58 AM

## 2013-08-18 NOTE — Progress Notes (Signed)
Occupational Therapy Session Notes & Discharge Summary  Patient Details  Name: [name] MRN: [id number] Date of Birth: 05-Jul-1941  Today's Date: 08/18/2013  SESSION NOTES  Session #1 [phone] - 20 Minutes Individual Therapy No complaints of pain Patient found in BR with NT upon entering room. From here, patient propelled self from room > ADL apartment for ADL retraining at shower level at overall mod I level for UB/LB bathing & UB/LB dressing. Patient requires supervision for shower transfers at this time; wife will be able to provide supervision. Patient propelled self back to room for grooming tasks. Left patient seated in w/c with all needed items within reach.   Session #2 [id number] - 25 Minutes Individual Therapy No complaints of pain Patient found seated in w/c. Focused skilled intervention on w/c mobility, furniture transfers and [place] strengthening exercises. Patient is pleased with his progress and eager to go home. At end of session, left patient seated in family room and notified RN and PT for next therapy session.  --------------------------------------------------------------------------------------------------------------------------   DISCHARGE SUMMARY Patient has met 10 of 10 long term goals due to improved activity tolerance, improved balance, postural control, ability to compensate for deficits, improved attention, improved awareness and improved coordination.  Patient to discharge at overall Modified Independent level.  Patient's care partner is independent to provide the necessary supervision prn at discharge.    Reasons goals not met: n/a, all goals met at this time.  Recommendation:  Patient will benefit from ongoing skilled OT services in home health setting to continue to advance functional skills in the area of BADL, iADL and for BUE strengthening/ROM.  Equipment: No equipment provided, patient has BSC and tub transfer bench from prior CIR  stay.  Reasons for discharge: treatment goals met and discharge from hospital  Patient/family agrees with progress made and goals achieved: Yes  Precautions/Restrictions  Precautions Precautions: Back;Fall Precaution Comments: Verbalize and adheres to 3/3 back precautions Required Braces or Orthoses:  (patient wears binder and AFO(longstanding)) Restrictions Weight Bearing Restrictions: No  Vital Signs Therapy Vitals Temp: 97.5 F (36.4 C) Temp src: Oral Pulse Rate: 68 Resp: 16 BP: 146/66 mmHg Patient Position, if appropriate: Lying Oxygen Therapy SpO2: 97 % O2 Device: None (Room air)  ADL - See FIM  Vision/Perception  Vision - History Baseline Vision: Wears glasses all the time Patient Visual Report: No change from baseline Vision - Assessment Eye Alignment: Within Functional Limits Perception Perception: Within Functional Limits Praxis Praxis: Intact   Cognition Overall Cognitive Status: Within Functional Limits for tasks assessed Orientation Level: Oriented X4 Memory: Appears intact Awareness: Appears intact Problem Solving: Appears intact Safety/Judgment: Appears intact  Sensation Sensation Light Touch: Impaired Detail (Impaired plantar surface of LLE) Light Touch Impaired Details: Impaired LLE  BUEs appear intact  Motor  Motor Motor: Ataxia  Trunk/Postural Assessment  Cervical Assessment Cervical Assessment: Within Functional Limits Thoracic Assessment Thoracic Assessment: Within Functional Limits Lumbar Assessment Lumbar Assessment: Exceptions to [facility] ((back precautions)) Postural Control Postural Control: Deficits on evaluation ((decreased standing balance))   Balance Balance Balance Assessed: Yes Static Sitting Balance Static Sitting - Level of Assistance: 6: Modified independent (Device/Increase time) Dynamic Sitting Balance Dynamic Sitting - Level of Assistance: 6: Modified independent (Device/Increase time) Static Standing  Balance Static Standing - Level of Assistance: 5: Stand by assistance (with the use of a RW) Dynamic Standing Balance Dynamic Standing - Level of Assistance: 5: Stand by assistance (with the use of a RW)  Extremity/Trunk Assessment RUE Assessment RUE Assessment:  Exceptions to [facility] RUE AROM (degrees) Overall AROM Right Upper Extremity: Deficits;Due to premorbid status (patient able to compensate for deficits, can continue to benefit from BUE strengthening) RUE PROM (degrees) Overall PROM Right Upper Extremity: Within functional limits for tasks performed LUE Assessment LUE Assessment: Exceptions to [facility] LUE AROM (degrees) Overall AROM Left Upper Extremity: Due to premorbid status;Deficits (patient able to compensate for deficits, can continue to benefit from BUE strengthening) LUE PROM (degrees) Overall PROM Left Upper Extremity: Within functional limits for tasks assessed  See FIM for current functional status  [name] 08/18/2013, 1:25 PM

## 2013-08-19 MED ORDER — OXYCODONE HCL 20 MG PO TABS: 20.0000 mg | ORAL_TABLET | Freq: Four times a day (QID) | ORAL | Status: DC | PRN

## 2013-08-19 MED ORDER — SULFAMETHOXAZOLE-TMP DS 800-160 MG PO TABS: 1.0000 | ORAL_TABLET | Freq: Two times a day (BID) | ORAL | Status: DC

## 2013-08-19 MED ORDER — PREDNISONE 20 MG PO TABS: 20.0000 mg | ORAL_TABLET | Freq: Two times a day (BID) | ORAL | Status: DC

## 2013-08-19 MED ORDER — CAMPHOR-MENTHOL 0.5-0.5 % EX LOTN: TOPICAL_LOTION | Freq: Three times a day (TID) | CUTANEOUS | Status: DC

## 2013-08-19 MED ORDER — VANCOMYCIN HCL IN DEXTROSE 1-5 GM/200ML-% IV SOLN: 1000.0000 mg | INTRAVENOUS | Status: DC

## 2013-08-19 MED ORDER — DEXTROSE 5 % IV SOLN: 2.0000 g | INTRAVENOUS | Status: DC

## 2013-08-19 MED ORDER — RIFAMPIN 300 MG PO CAPS: 300.0000 mg | ORAL_CAPSULE | Freq: Two times a day (BID) | ORAL | Status: DC

## 2013-08-19 MED ORDER — ACETAMINOPHEN 325 MG PO TABS: 325.0000 mg | ORAL_TABLET | ORAL | Status: DC | PRN

## 2013-08-19 MED ORDER — SULFAMETHOXAZOLE-TMP DS 800-160 MG PO TABS
1.0000 | ORAL_TABLET | Freq: Two times a day (BID) | ORAL | Status: DC
  Administered 2013-08-19: 1 via ORAL
  Filled 2013-08-19 (×3): qty 1

## 2013-08-19 MED ORDER — COLCHICINE 0.6 MG PO TABS: 0.6000 mg | ORAL_TABLET | Freq: Two times a day (BID) | ORAL | Status: DC

## 2013-08-19 MED ORDER — METHOCARBAMOL 500 MG PO TABS: 500.0000 mg | ORAL_TABLET | Freq: Four times a day (QID) | ORAL | Status: DC | PRN

## 2013-08-19 MED ORDER — NYSTATIN 100000 UNIT/GM EX CREA: TOPICAL_CREAM | Freq: Two times a day (BID) | CUTANEOUS | Status: DC

## 2013-08-19 MED ORDER — GABAPENTIN 300 MG PO CAPS: 600.0000 mg | ORAL_CAPSULE | Freq: Three times a day (TID) | ORAL | Status: DC

## 2013-08-19 MED ORDER — OXYCODONE HCL ER 20 MG PO T12A: 20.0000 mg | EXTENDED_RELEASE_TABLET | Freq: Two times a day (BID) | ORAL | Status: DC

## 2013-08-19 MED ORDER — NORTRIPTYLINE HCL 10 MG PO CAPS: 10.0000 mg | ORAL_CAPSULE | Freq: Every day | ORAL | Status: DC

## 2013-08-19 NOTE — Discharge Summary (Signed)
Physician Discharge Summary  Patient ID: [name] MRN: [id number] DOB/AGE: 71/09/1940 71 y.o.  Admit date: 08/08/2013 Discharge date: 08/19/2013  Discharge Diagnoses:  Principal Problem:   Cauda equina syndrome with neurogenic bladder Active Problems:   Neurogenic bowel   Gout flare   Postoperative wound infection   Discharged Condition: Stable.   Significant Diagnostic Studies:  Labs:  Basic Metabolic Panel:  Recent Labs Lab 08/18/13 0855  NA 141  K 4.5  CL 101  CO2 26  GLUCOSE 143*  BUN 16  CREATININE 1.46*  CALCIUM 9.1    CBC:  Recent Labs Lab 08/18/13 0855  WBC 7.5  NEUTROABS 4.2  HGB 10.9*  HCT 33.1*  MCV 89.0  PLT 368    CBG: No results found for this basename: GLUCAP,  in the last 168 hours  Brief HPI:   [name] is a 73 y.o. right-handed male well known to rehabilitation services from recent stay (11/26--07/08/2013) for lumbar L4-S1 decompression complicated by CSF leak and cauda equina syndrome. He was readmitted on 07/30/2013 with progressive low back pain X 3-4 days. MRI of the lumbar spine showed epidural fluid collection and some ring-enhancing fluid collections in the soft tissues. Patient underwent exploration of lumbar wound with removal of fibrosis and decompression of the thecal sac and the right side as well as lumbar L3 and L5 nerve root 08/03/2013 per Dr. Jeral FruitBotero.  Blood cultures, wound cultures negative but patient received antibiotics at home prior to admission and ID recommends continuing IV antibiotics X 6 weeks. Patient with LLE pain with edema as well as pain/difficulty with weight bearing. LLE dopplers negative for DVT. Ortho was consulted due to left shoulder pain. Shoulder aspirated and patient treated with steroids for exacerbated symptoms due to  L-RTC tear. CIR was recommended by rehab team.    Hospital Course: [name] was admitted to rehab 08/08/2013 for inpatient therapies to consist of PT, ST and OT at  least three hours five days a week. Past admission physiatrist, therapy team and rehab RN have worked together to provide customized collaborative inpatient rehab. Wound has been monitored daily. Drainage has resolved and incision is clean, dry and intact. Bowel program was done daily in am. Foley was discontinued and patient has required in and out cath due to retention as well as incontinence. Urecholine was discontinued and patient was advised to continue qid caths.  He is set up to follow with urology for further work up of neurogenic bladder.  Candidal irritation perineal area has improved with addition of diflucan X 5 days as well as local care.    High level of anxiety have improved with ego support by team.  Neurontin was increased to help with dysesthesias. He has had flare up of gout and was treated with a course of steroids. He did have recurrence of gout symptoms as well as pruritis once prednisone was tapered 10 mg/daily.  He was started on colchicine  bid and prednisone was increased to 20 mg bid. His symptoms greatly improved with this regimen and he was advised on slow taper by 5 mg every 5 days at discharge. His activity level and  mobility have improved to being modified independent at wheelchair level.  He has had worsening of renal function due to elevated vancomycin levels. He did express concerns about cost of antibiotics at discharge and Dr. Jeral FruitBotero contacted ID for input as he felt that patient with most likely inflammatory response to glue used during surgery.  She recommended  that given financial constraints and questionable post surgical infection; conservative management by oral antibiotics as well as recheckof inflammatory markers at end of course would be justified. He was changed to bactrim DS plus rifampin 300 mg bid and is to follow up with infectious disease MD on 09/05/13.  Anticipate renal status to improve off vancomycin. HHRN to draw labs next week with results to Dr Juanetta Gosling.     Rehab course: During patient's stay in rehab weekly team conferences were held to monitor patient's progress, set goals and discuss barriers to discharge. Patient has had improvement in activity tolerance, balance, postural control, as well as ability to compensate for deficits. He is independent for bathing and dressing tasks with use of AE. He is independent for transfers and wheelchair mobility. He will continue to receive HHPT and HHOT past discharge.    Disposition: 06-Home-Health Care Svc  Diet: Regular.   Special Instructions: 1. [facility] to provide PT,OT, RN. 2. Maintain back precautions.  3. Follow up with [name] for input on steroid taper.        Future Appointments Provider Department Dept Phone   09/05/2013 2:00 PM Ginnie Smart, MD [facility] [phone]   09/20/2013 10:20 AM Ranelle Oyster, MD [facility] Physical Medicine and Rehabilitation [phone]   11/03/2013 8:30 AM Vickki Hearing, MD [facility] [phone]       Medication List    STOP taking these medications       bethanechol 50 MG tablet  Commonly known as:  URECHOLINE     oxyCODONE 15 MG immediate release tablet  Commonly known as:  ROXICODONE  Replaced by:  OxyCODONE 20 mg T12a 12 hr tablet      TAKE these medications       acetaminophen 325 MG tablet  Commonly known as:  TYLENOL  Take 1-2 tablets (325-650 mg total) by mouth every 4 (four) hours as needed for mild pain.     atorvastatin 40 MG tablet  Commonly known as:  LIPITOR  Take 1 tablet (40 mg total) by mouth daily.     camphor-menthol lotion  Commonly known as:  SARNA  Apply topically 3 (three) times daily after meals.     citalopram 40 MG tablet  Commonly known as:  CELEXA  Take 40 mg by mouth daily.     colchicine 0.6 MG tablet  Take 1 tablet (0.6 mg total) by mouth 2 (two) times daily.     doxazosin 4 MG tablet  Commonly known  as:  CARDURA  Take 8 mg by mouth at bedtime.     gabapentin 300 MG capsule  Commonly known as:  NEURONTIN  Take 2 capsules (600 mg total) by mouth 3 (three) times daily.     methocarbamol 500 MG tablet  Commonly known as:  ROBAXIN  Take 1 tablet (500 mg total) by mouth 4 (four) times daily as needed for muscle spasms.     nitroGLYCERIN 0.4 MG SL tablet  Commonly known as:  NITROSTAT  Place 0.4 mg under the tongue every 5 (five) minutes as needed. For chest pain     nortriptyline 10 MG capsule  Commonly known as:  PAMELOR  Take 1 capsule (10 mg total) by mouth at bedtime.     nystatin cream  Commonly known as:  MYCOSTATIN  Apply topically 2 (two) times daily.     OxyCODONE 20 mg T12a 12 hr tablet--Rx #  60 pills  Commonly known as:  OXYCONTIN  Take 1 tablet (20 mg total) by mouth every 12 (twelve) hours.     Oxycodone HCl 20 MG Tabs--Rx # 120 pills  Take 1 tablet (20 mg total) by mouth every 6 (six) hours as needed for moderate pain.     predniSONE 20 MG tablet  Commonly known as:  DELTASONE  - Take 1 tablet (20 mg total) by mouth 2 (two) times daily with a meal. Start taper of one pill in am and 1/2 pill in pm for 5 days.  - Then decrease to one pill in am daily.     rifampin 300 MG capsule  Commonly known as:  RIFADIN  Take 1 capsule (300 mg total) by mouth every 12 (twelve) hours.     sulfamethoxazole-trimethoprim 800-160 MG per tablet  Commonly known as:  BACTRIM DS  Take 1 tablet by mouth every 12 (twelve) hours.     tamsulosin 0.4 MG Caps capsule  Commonly known as:  FLOMAX  Take 1 capsule (0.4 mg total) by mouth daily after breakfast.     VITAMIN B 12 PO  Take 1 tablet by mouth daily.     vitamin C 500 MG tablet  Commonly known as:  ASCORBIC ACID  Take 500 mg by mouth daily.     VITAMIN D (CHOLECALCIFEROL) PO  Take 2-3 tablets by mouth daily.       Follow-up Information   Follow up with Ranelle Oyster, MD On 09/20/2013. (Be there at 10 am for 10:30  appointment.)    Specialty:  Physical Medicine and Rehabilitation   Contact information:   [address] [place] [address] [phone]       Follow up with Judyann Munson, MD On 09/05/2013. (Be there at 2 pm--follow up on IV antibiotic treatment. )    Specialty:  Infectious Diseases   Contact information:   [address] [place] [address] [phone]       Follow up with Karn Cassis, MD. Call today. (for appointment)    Specialty:  Neurosurgery   Contact information:   [address] [place] [address] [phone]       Follow up with [facility] On 08/25/2013. (Appointment with Dr. Sherron Monday at 12:45 pm)    Contact information:   [address] [place] [address] [phone]       Signed: Jacquelynn Cree 08/22/2013, 7:55 PM

## 2013-08-19 NOTE — Discharge Instructions (Signed)
Inpatient Rehab Discharge Instructions  [name] [facility] Discharge date and time:    Activities/Precautions/ Functional Status: Activity: activity as tolerated--No bending, twisting or arching. No driving.  5 lbs weight limit.  Diet: regular diet  Wound Care: keep wound clean and dry Functional status:  ___ No restrictions     ___ Walk up steps independently ___ 24/7 supervision/assistance   ___ Walk up steps with assistance _X__ Intermittent supervision/assistance  ___ Bathe/dress independently ___ Walk with walker     ___ Bathe/dress with assistance ___ Walk Independently    ___ Shower independently ___ Walk with assistance    ___ Shower with assistance _X__ No alcohol     ___ Return to work/school ________   COMMUNITY REFERRALS UPON DISCHARGE:    Home Health:   PT     OT     RN                       Agency:  [facility] Phone: [phone]        Special Instructions:    My questions have been answered and I understand these instructions. I will adhere to these goals and the provided educational materials after my discharge from the hospital.  Patient/Caregiver Signature _______________________________ Date __________  Clinician Signature _______________________________________ Date __________  Please bring this form and your medication list with you to all your follow-up doctor's appointments.

## 2013-08-19 NOTE — Progress Notes (Signed)
Patient to go home today but according to him he has no $ to pay for iv ABS. I DID TALK WITH DR Ilsa IhaSNYDER who after reviewing the chart agrees with po ABS. I DID SPOKE WUITH MRand MRS Rashid about pt at home and later on at a facility as outpatient. Also about the care of his bladder. REHAB MED IS GETTING AN APPOITMNET WITH A LOCAL gu. i will call him in 3 weeks but they can call my office anytime. Also i gave the wife info about his condition so she can look it in the internet

## 2013-08-19 NOTE — Progress Notes (Signed)
Pt was discharged with family to home

## 2013-08-19 NOTE — Progress Notes (Signed)
ID Progress Note  Spoke with Dr. Jeral FruitBotero regarding [name]'s case. He feels that he didn't have infection but most likely inflammatory response to glue used during original surgery. The culture from his I x D were negative (however the patient had been on antibiotics for 2 days). In support of Dr. Cassandria SanteeBotero's assessment, the patient's inflammatory markers were not significantly elevated which you would suspect to be very increased in infection.  Given financial constraints and questionable post surgical infection, we can do conservative management by giving oral antibiotics to the patient, have him follow up in ID clinic and recheck his inflammatory markers at end of course.    Recommend oral antibiotics of bactrim DS 1 bid plus rifampin 300mg  BID. Continue to take rifampin on full stomach to minimize nausea  If questions, please call.  Duke Salviaynthia B. Drue SecondSnider MD MPH [facility] [phone]

## 2013-08-19 NOTE — Progress Notes (Signed)
Subjective/Complaints: Feeling better today. Pain controlled. Doing caths. Voiding some on his own. Ready to go home. A 12 point review of systems has been performed and if not noted above is otherwise negative.   Objective: Vital Signs: Blood pressure 178/80, pulse 71, temperature 97.6 F (36.4 C), temperature source Oral, resp. rate 18, height 6' (1.829 m), weight 81.602 kg (179 lb 14.4 oz), SpO2 96.00%. No results found.  Recent Labs  08/18/13 0855  WBC 7.5  HGB 10.9*  HCT 33.1*  PLT 368    Recent Labs  08/18/13 0855  NA 141  K 4.5  CL 101  GLUCOSE 143*  BUN 16  CREATININE 1.46*  CALCIUM 9.1   CBG (last 3)  No results found for this basename: GLUCAP,  in the last 72 hours  Wt Readings from Last 3 Encounters:  08/08/13 81.602 kg (179 lb 14.4 oz)  07/30/13 81.602 kg (179 lb 14.4 oz)  07/30/13 81.602 kg (179 lb 14.4 oz)    Physical Exam:  Constitutional: He is oriented to person, place, and time. He appears well-developed and well-nourished.  HENT:  Head: Normocephalic and atraumatic.  Right Ear: External ear normal.  Left Ear: External ear normal.  Eyes: Conjunctivae are normal. Pupils are equal, round, and reactive to light.  Neck: Normal range of motion. Neck supple. No JVD present. No tracheal deviation present. No thyromegaly present.  Cardiovascular: Normal rate, regular rhythm and normal heart sounds.  Respiratory: Effort normal and breath sounds normal. No respiratory distress. He has no wheezes. He has no rales.  GI: Soft. Bowel sounds are normal. He exhibits no distension. There is no tenderness. There is no rebound.     Musculoskeletal: He exhibits feet are tender, no obvious edema. Ankles and feet less tender. Right forearm normal Lymphadenopathy:  He has no cervical adenopathy.  Neurological: He is alert and oriented to person, place, and time.  4+/5 strength bilateral UE's proximal to distal. Normal sensation in bilateral upper  extremities Absent sensation left foot but intact above the ankle Left lower extremity strength 3 hip flexion, 3+ knee extension 1/5 ankle plantarflexion 0/5 ankle dorsiflexion Near normal sensation right foot and right lower extremity Right lower extremity strength is 4-/5 hip flexion knee extension, 4 minus ankle dorsiflexion and plantarflexion  Skin: Skin is warm.. Surgical site well approximated. No drainage, swelling, discoloration  Psychiatric: His speech is normal. Judgment and thought content normal.  Mood is improved Cognition and memory are normal.    Assessment/Plan: 1. Functional deficits secondary to cauda equina, wound infection s/p I and D which require 3+ hours per day of interdisciplinary therapy in a comprehensive inpatient rehab setting. Physiatrist is providing close team supervision and 24 hour management of active medical problems listed below. Physiatrist and rehab team continue to assess barriers to discharge/monitor patient progress toward functional and medical goals.  Home today. Follow up with pcp, ns, uro, pmr, hh services   FIM: FIM - Bathing Bathing Steps Patient Completed: Chest;Right Arm;Left Arm;Abdomen;Front perineal area;Right upper leg;Left upper leg;Right lower leg (including foot);Left lower leg (including foot);Buttocks Bathing: 6: Assistive device (Comment)  FIM - Upper Body Dressing/Undressing Upper body dressing/undressing steps patient completed: Thread/unthread right sleeve of pullover shirt/dresss;Thread/unthread left sleeve of pullover shirt/dress;Pull shirt over trunk;Put head through opening of pull over shirt/dress Upper body dressing/undressing: 7: Complete Independence: No helper FIM - Lower Body Dressing/Undressing Lower body dressing/undressing steps patient completed: Thread/unthread right pants leg;Thread/unthread left pants leg;Pull pants up/down;Don/Doff right shoe;Don/Doff left shoe;Fasten/unfasten pants;Fasten/unfasten right  shoe;Fasten/unfasten left shoe Lower body dressing/undressing: 6: Assistive device (Comment)  FIM - Toileting Toileting steps completed by patient: Adjust clothing prior to toileting;Performs perineal hygiene;Adjust clothing after toileting Toileting Assistive Devices: Grab bar or rail for support Toileting: 6: Assistive device: No helper  FIM - Diplomatic Services operational officer Devices: Elevated toilet seat;Grab bars Toilet Transfers: 6-Assistive device: No helper  FIM - Banker Devices: Arm rests Bed/Chair Transfer: 6: Supine > Sit: No assist;6: Bed > Chair or W/C: No assist;6: Chair or W/C > Bed: No assist;6: Sit > Supine: No assist  FIM - Locomotion: Wheelchair Distance: 120 (per PT note) Locomotion: Wheelchair: 6: Travels 150 ft or more, turns around, maneuvers to table, bed or toilet, negotiates 3% grade: maneuvers on rugs and over door sills independently FIM - Locomotion: Ambulation Locomotion: Ambulation Assistive Devices: Parallel bars;Orthosis Ambulation/Gait Assistance: 4: Min guard Locomotion: Ambulation: 1: Travels less than 50 ft with supervision/safety issues  Comprehension Comprehension Mode: Auditory Comprehension: 7-Follows complex conversation/direction: With no assist  Expression Expression Mode: Verbal Expression: 7-Expresses complex ideas: With no assist  Social Interaction Social Interaction: 7-Interacts appropriately with others - No medications needed.  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  1. DVT Prophylaxis/Anticoagulation: Pharmaceutical: Lovenox  2. Pain Management:feeling better today. Seems to be doing much better  -Continue neurontin for dysesthesias- .   -added low dose TCA at  night  -prednisone taper for gout flare ---will increase back to 20mg  bid given sx and exam  -oxycontin cr 20mg  q12 3. Mood: Team to provide ego support for elevated levels of anxiety. :CSW to follow for evaluation and support.  4. Neuropsych: This patient is capable of making decisions on his own behalf.  5. Neurogenic bladder: Continue Flomax, Cardura, increased cardura back to 8mg   -outpt uro follow up arranged  -continue I and O caths.    -having spontaneous voids, usually urgent and incontinent. dc'ed urecholine  6. Neurogenic bowel:  re-establishing bowel program. Suppository daily in am.  7. Anemia: hgb 9.4---track serially 8. Skin: Candidal irritation perineal area, buttocks breakdown---improving  -continue local care, keep skin dry, clean, turn 9. ID: continue vanc/ctx  LOS (Days) 11 A FACE TO FACE EVALUATION WAS PERFORMED  [name] T 08/19/2013 8:05 AM

## 2013-08-19 NOTE — Progress Notes (Signed)
Social Work Discharge Note  The overall goal for the admission was met for:   Discharge location: Yes - home with wife who can provide 24/7 assist if needed  Length of Stay: Yes - 11 days  Discharge activity level: Yes - modified independent w/c level  Home/community participation: Yes  Services provided included: MD, RD, PT, OT, RN, TR, Pharmacy and [place]: Medicare and Private Insurance: [place]  Follow-up services arranged: Home Health: RN, PT, OT via [place]  and Patient/Family has no preference for HH/DME agencies  Comments (or additional information):  Patient/Family verbalized understanding of follow-up arrangements: Yes  Individual responsible for coordination of the follow-up plan: patient  Confirmed correct DME delivered: NA - has needed DME    Alea Ryer

## 2013-08-20 DIAGNOSIS — K592 Neurogenic bowel, not elsewhere classified: Secondary | ICD-10-CM | POA: Diagnosis not present

## 2013-08-20 DIAGNOSIS — N319 Neuromuscular dysfunction of bladder, unspecified: Secondary | ICD-10-CM | POA: Diagnosis not present

## 2013-08-20 DIAGNOSIS — M4716 Other spondylosis with myelopathy, lumbar region: Secondary | ICD-10-CM | POA: Diagnosis not present

## 2013-08-21 DIAGNOSIS — I2510 Atherosclerotic heart disease of native coronary artery without angina pectoris: Secondary | ICD-10-CM | POA: Diagnosis not present

## 2013-08-21 DIAGNOSIS — S60929A Unspecified superficial injury of unspecified hand, initial encounter: Secondary | ICD-10-CM | POA: Diagnosis not present

## 2013-08-21 DIAGNOSIS — Z4789 Encounter for other orthopedic aftercare: Secondary | ICD-10-CM | POA: Diagnosis not present

## 2013-08-21 DIAGNOSIS — I10 Essential (primary) hypertension: Secondary | ICD-10-CM | POA: Diagnosis not present

## 2013-08-21 DIAGNOSIS — S50909A Unspecified superficial injury of unspecified elbow, initial encounter: Secondary | ICD-10-CM | POA: Diagnosis not present

## 2013-08-21 DIAGNOSIS — N319 Neuromuscular dysfunction of bladder, unspecified: Secondary | ICD-10-CM | POA: Diagnosis not present

## 2013-08-22 DIAGNOSIS — K592 Neurogenic bowel, not elsewhere classified: Secondary | ICD-10-CM | POA: Diagnosis present

## 2013-08-22 DIAGNOSIS — I259 Chronic ischemic heart disease, unspecified: Secondary | ICD-10-CM | POA: Diagnosis not present

## 2013-08-22 DIAGNOSIS — F3289 Other specified depressive episodes: Secondary | ICD-10-CM | POA: Diagnosis not present

## 2013-08-22 DIAGNOSIS — G834 Cauda equina syndrome: Secondary | ICD-10-CM | POA: Diagnosis present

## 2013-08-22 DIAGNOSIS — G061 Intraspinal abscess and granuloma: Secondary | ICD-10-CM | POA: Diagnosis not present

## 2013-08-22 DIAGNOSIS — T8149XA Infection following a procedure, other surgical site, initial encounter: Secondary | ICD-10-CM | POA: Diagnosis present

## 2013-08-22 DIAGNOSIS — F329 Major depressive disorder, single episode, unspecified: Secondary | ICD-10-CM | POA: Diagnosis not present

## 2013-08-22 DIAGNOSIS — I10 Essential (primary) hypertension: Secondary | ICD-10-CM | POA: Diagnosis not present

## 2013-08-22 DIAGNOSIS — M109 Gout, unspecified: Secondary | ICD-10-CM | POA: Diagnosis not present

## 2013-08-23 DIAGNOSIS — I10 Essential (primary) hypertension: Secondary | ICD-10-CM | POA: Diagnosis not present

## 2013-08-23 DIAGNOSIS — S60929A Unspecified superficial injury of unspecified hand, initial encounter: Secondary | ICD-10-CM | POA: Diagnosis not present

## 2013-08-23 DIAGNOSIS — S50909A Unspecified superficial injury of unspecified elbow, initial encounter: Secondary | ICD-10-CM | POA: Diagnosis not present

## 2013-08-23 DIAGNOSIS — Z4789 Encounter for other orthopedic aftercare: Secondary | ICD-10-CM | POA: Diagnosis not present

## 2013-08-23 DIAGNOSIS — N319 Neuromuscular dysfunction of bladder, unspecified: Secondary | ICD-10-CM | POA: Diagnosis not present

## 2013-08-23 DIAGNOSIS — I2510 Atherosclerotic heart disease of native coronary artery without angina pectoris: Secondary | ICD-10-CM | POA: Diagnosis not present

## 2013-08-24 DIAGNOSIS — N319 Neuromuscular dysfunction of bladder, unspecified: Secondary | ICD-10-CM | POA: Diagnosis not present

## 2013-08-24 DIAGNOSIS — I10 Essential (primary) hypertension: Secondary | ICD-10-CM | POA: Diagnosis not present

## 2013-08-24 DIAGNOSIS — S50909A Unspecified superficial injury of unspecified elbow, initial encounter: Secondary | ICD-10-CM | POA: Diagnosis not present

## 2013-08-24 DIAGNOSIS — S60919A Unspecified superficial injury of unspecified wrist, initial encounter: Secondary | ICD-10-CM | POA: Diagnosis not present

## 2013-08-24 DIAGNOSIS — I2510 Atherosclerotic heart disease of native coronary artery without angina pectoris: Secondary | ICD-10-CM | POA: Diagnosis not present

## 2013-08-24 DIAGNOSIS — S60929A Unspecified superficial injury of unspecified hand, initial encounter: Secondary | ICD-10-CM | POA: Diagnosis not present

## 2013-08-24 DIAGNOSIS — Z4789 Encounter for other orthopedic aftercare: Secondary | ICD-10-CM | POA: Diagnosis not present

## 2013-08-25 ENCOUNTER — Telehealth: Payer: Self-pay

## 2013-08-25 DIAGNOSIS — N319 Neuromuscular dysfunction of bladder, unspecified: Secondary | ICD-10-CM | POA: Diagnosis not present

## 2013-08-25 DIAGNOSIS — R339 Retention of urine, unspecified: Secondary | ICD-10-CM | POA: Diagnosis not present

## 2013-08-25 NOTE — Telephone Encounter (Signed)
[name] @ [facility] called to inform you that patient has a couple questions.   1.Patient wants to know why he is taking Rifampin 2.Patient also wants to know why he has to go to the infectious disease clinic 3. Patient is having excessive itching- he is also taking sulfa along with the rifampin.

## 2013-08-26 DIAGNOSIS — I10 Essential (primary) hypertension: Secondary | ICD-10-CM | POA: Diagnosis not present

## 2013-08-26 DIAGNOSIS — S50909A Unspecified superficial injury of unspecified elbow, initial encounter: Secondary | ICD-10-CM | POA: Diagnosis not present

## 2013-08-26 DIAGNOSIS — N319 Neuromuscular dysfunction of bladder, unspecified: Secondary | ICD-10-CM | POA: Diagnosis not present

## 2013-08-26 DIAGNOSIS — Z4789 Encounter for other orthopedic aftercare: Secondary | ICD-10-CM | POA: Diagnosis not present

## 2013-08-26 DIAGNOSIS — I2510 Atherosclerotic heart disease of native coronary artery without angina pectoris: Secondary | ICD-10-CM | POA: Diagnosis not present

## 2013-08-26 DIAGNOSIS — S60929A Unspecified superficial injury of unspecified hand, initial encounter: Secondary | ICD-10-CM | POA: Diagnosis not present

## 2013-08-26 NOTE — Telephone Encounter (Signed)
He's on those to cover back wound/ surgery. He has an appt on 2/9 with dr. Ilsa IhaSnyder with infectious disease who will discuss his ongoing needs. May use some benadryl or hydrocortisone cream for the itching for now.

## 2013-08-26 NOTE — Telephone Encounter (Signed)
Left a voicemail to return call to clinic.

## 2013-08-27 DIAGNOSIS — I2510 Atherosclerotic heart disease of native coronary artery without angina pectoris: Secondary | ICD-10-CM | POA: Diagnosis not present

## 2013-08-27 DIAGNOSIS — S50909A Unspecified superficial injury of unspecified elbow, initial encounter: Secondary | ICD-10-CM | POA: Diagnosis not present

## 2013-08-27 DIAGNOSIS — I10 Essential (primary) hypertension: Secondary | ICD-10-CM | POA: Diagnosis not present

## 2013-08-27 DIAGNOSIS — Z4789 Encounter for other orthopedic aftercare: Secondary | ICD-10-CM | POA: Diagnosis not present

## 2013-08-27 DIAGNOSIS — S60929A Unspecified superficial injury of unspecified hand, initial encounter: Secondary | ICD-10-CM | POA: Diagnosis not present

## 2013-08-27 DIAGNOSIS — N319 Neuromuscular dysfunction of bladder, unspecified: Secondary | ICD-10-CM | POA: Diagnosis not present

## 2013-08-29 DIAGNOSIS — S60929A Unspecified superficial injury of unspecified hand, initial encounter: Secondary | ICD-10-CM | POA: Diagnosis not present

## 2013-08-29 DIAGNOSIS — Z4789 Encounter for other orthopedic aftercare: Secondary | ICD-10-CM | POA: Diagnosis not present

## 2013-08-29 DIAGNOSIS — I2510 Atherosclerotic heart disease of native coronary artery without angina pectoris: Secondary | ICD-10-CM | POA: Diagnosis not present

## 2013-08-29 DIAGNOSIS — N319 Neuromuscular dysfunction of bladder, unspecified: Secondary | ICD-10-CM | POA: Diagnosis not present

## 2013-08-29 DIAGNOSIS — I10 Essential (primary) hypertension: Secondary | ICD-10-CM | POA: Diagnosis not present

## 2013-08-29 DIAGNOSIS — S50909A Unspecified superficial injury of unspecified elbow, initial encounter: Secondary | ICD-10-CM | POA: Diagnosis not present

## 2013-08-30 ENCOUNTER — Telehealth: Payer: Self-pay | Admitting: Licensed Clinical Social Worker

## 2013-08-30 ENCOUNTER — Emergency Department (HOSPITAL_COMMUNITY): Payer: Medicare Other

## 2013-08-30 ENCOUNTER — Emergency Department (HOSPITAL_COMMUNITY)
Admission: EM | Admit: 2013-08-30 | Discharge: 2013-08-31 | Disposition: A | Discharge status: Home / Self Care | Payer: Medicare Other | Attending: Emergency Medicine | Admitting: Emergency Medicine

## 2013-08-30 ENCOUNTER — Encounter (HOSPITAL_COMMUNITY): Payer: Self-pay | Admitting: Emergency Medicine

## 2013-08-30 DIAGNOSIS — S50909A Unspecified superficial injury of unspecified elbow, initial encounter: Secondary | ICD-10-CM | POA: Diagnosis not present

## 2013-08-30 DIAGNOSIS — Z87891 Personal history of nicotine dependence: Secondary | ICD-10-CM | POA: Diagnosis not present

## 2013-08-30 DIAGNOSIS — J8410 Pulmonary fibrosis, unspecified: Secondary | ICD-10-CM | POA: Diagnosis not present

## 2013-08-30 DIAGNOSIS — Z8719 Personal history of other diseases of the digestive system: Secondary | ICD-10-CM | POA: Diagnosis not present

## 2013-08-30 DIAGNOSIS — Z8614 Personal history of Methicillin resistant Staphylococcus aureus infection: Secondary | ICD-10-CM | POA: Diagnosis not present

## 2013-08-30 DIAGNOSIS — S60929A Unspecified superficial injury of unspecified hand, initial encounter: Secondary | ICD-10-CM | POA: Diagnosis not present

## 2013-08-30 DIAGNOSIS — S50919A Unspecified superficial injury of unspecified forearm, initial encounter: Secondary | ICD-10-CM | POA: Diagnosis not present

## 2013-08-30 DIAGNOSIS — E785 Hyperlipidemia, unspecified: Secondary | ICD-10-CM | POA: Diagnosis not present

## 2013-08-30 DIAGNOSIS — I10 Essential (primary) hypertension: Secondary | ICD-10-CM | POA: Insufficient documentation

## 2013-08-30 DIAGNOSIS — R609 Edema, unspecified: Secondary | ICD-10-CM | POA: Insufficient documentation

## 2013-08-30 DIAGNOSIS — IMO0002 Reserved for concepts with insufficient information to code with codable children: Secondary | ICD-10-CM | POA: Insufficient documentation

## 2013-08-30 DIAGNOSIS — Z9861 Coronary angioplasty status: Secondary | ICD-10-CM | POA: Insufficient documentation

## 2013-08-30 DIAGNOSIS — Z8739 Personal history of other diseases of the musculoskeletal system and connective tissue: Secondary | ICD-10-CM | POA: Insufficient documentation

## 2013-08-30 DIAGNOSIS — M109 Gout, unspecified: Secondary | ICD-10-CM | POA: Insufficient documentation

## 2013-08-30 DIAGNOSIS — Z4789 Encounter for other orthopedic aftercare: Secondary | ICD-10-CM | POA: Diagnosis not present

## 2013-08-30 DIAGNOSIS — Z8701 Personal history of pneumonia (recurrent): Secondary | ICD-10-CM | POA: Insufficient documentation

## 2013-08-30 DIAGNOSIS — Z862 Personal history of diseases of the blood and blood-forming organs and certain disorders involving the immune mechanism: Secondary | ICD-10-CM | POA: Insufficient documentation

## 2013-08-30 DIAGNOSIS — I2510 Atherosclerotic heart disease of native coronary artery without angina pectoris: Secondary | ICD-10-CM | POA: Insufficient documentation

## 2013-08-30 DIAGNOSIS — Z792 Long term (current) use of antibiotics: Secondary | ICD-10-CM | POA: Insufficient documentation

## 2013-08-30 DIAGNOSIS — Z951 Presence of aortocoronary bypass graft: Secondary | ICD-10-CM | POA: Insufficient documentation

## 2013-08-30 DIAGNOSIS — F411 Generalized anxiety disorder: Secondary | ICD-10-CM | POA: Insufficient documentation

## 2013-08-30 DIAGNOSIS — Z87448 Personal history of other diseases of urinary system: Secondary | ICD-10-CM | POA: Insufficient documentation

## 2013-08-30 DIAGNOSIS — Z79899 Other long term (current) drug therapy: Secondary | ICD-10-CM | POA: Insufficient documentation

## 2013-08-30 DIAGNOSIS — M7989 Other specified soft tissue disorders: Secondary | ICD-10-CM | POA: Diagnosis not present

## 2013-08-30 DIAGNOSIS — N319 Neuromuscular dysfunction of bladder, unspecified: Secondary | ICD-10-CM | POA: Diagnosis not present

## 2013-08-30 LAB — COMPREHENSIVE METABOLIC PANEL
ALT: 13 U/L (ref 0–53)
AST: 18 U/L (ref 0–37)
Albumin: 3.2 g/dL — ABNORMAL LOW (ref 3.5–5.2)
Alkaline Phosphatase: 66 U/L (ref 39–117)
BUN: 17 mg/dL (ref 6–23)
CO2: 29 meq/L (ref 19–32)
CREATININE: 1.39 mg/dL — AB (ref 0.50–1.35)
Calcium: 9.3 mg/dL (ref 8.4–10.5)
Chloride: 98 mEq/L (ref 96–112)
GFR calc Af Amer: 58 mL/min — ABNORMAL LOW (ref >90)
GFR, EST NON AFRICAN AMERICAN: 50 mL/min — AB (ref >90)
Glucose, Bld: 133 mg/dL — ABNORMAL HIGH (ref 70–99)
Potassium: 5.6 mEq/L — ABNORMAL HIGH (ref 3.7–5.3)
SODIUM: 137 meq/L (ref 137–147)
Total Bilirubin: <0.2 mg/dL — ABNORMAL LOW (ref 0.3–1.2)
Total Protein: 6.2 g/dL (ref 6.0–8.3)

## 2013-08-30 LAB — PRO B NATRIURETIC PEPTIDE: Pro B Natriuretic peptide (BNP): 738.4 pg/mL — ABNORMAL HIGH (ref 0–125)

## 2013-08-30 LAB — CBC WITH DIFFERENTIAL/PLATELET
Basophils Absolute: 0 10*3/uL (ref 0.0–0.1)
Basophils Relative: 0 % (ref 0–1)
Eosinophils Absolute: 1.2 10*3/uL — ABNORMAL HIGH (ref 0.0–0.7)
Eosinophils Relative: 17 % — ABNORMAL HIGH (ref 0–5)
HCT: 28.6 % — ABNORMAL LOW (ref 39.0–52.0)
HEMOGLOBIN: 9.5 g/dL — AB (ref 13.0–17.0)
Lymphocytes Relative: 18 % (ref 12–46)
Lymphs Abs: 1.3 10*3/uL (ref 0.7–4.0)
MCH: 30.2 pg (ref 26.0–34.0)
MCHC: 33.2 g/dL (ref 30.0–36.0)
MCV: 90.8 fL (ref 78.0–100.0)
Monocytes Absolute: 0.7 10*3/uL (ref 0.1–1.0)
Monocytes Relative: 11 % (ref 3–12)
NEUTROS ABS: 3.6 10*3/uL (ref 1.7–7.7)
NEUTROS PCT: 54 % (ref 43–77)
PLATELETS: 193 10*3/uL (ref 150–400)
RBC: 3.15 MIL/uL — ABNORMAL LOW (ref 4.22–5.81)
RDW: 16.1 % — AB (ref 11.5–15.5)
WBC: 6.8 10*3/uL (ref 4.0–10.5)

## 2013-08-30 LAB — TROPONIN I: Troponin I: <0.3 ng/mL (ref <0.30)

## 2013-08-30 MED ORDER — FUROSEMIDE 10 MG/ML IJ SOLN
40.0000 mg | Freq: Once | INTRAMUSCULAR | Status: AC
Start: 2013-08-30 — End: 2013-08-30
  Administered 2013-08-30: 40 mg via INTRAVENOUS
  Filled 2013-08-30: qty 4

## 2013-08-30 NOTE — Telephone Encounter (Signed)
Mandy, RN called stating that this patient stopped taking Rifampin last Friday because it made him feel weak and not feel good. RN wanted to know is there another medication he could change to? Please advise

## 2013-08-30 NOTE — ED Notes (Signed)
Recent back surgery in November.  Unable to ambulate since surgery.  New lower extremity  edema and redness.

## 2013-08-30 NOTE — Telephone Encounter (Signed)
I will notify the patient and RN

## 2013-08-30 NOTE — Discharge Instructions (Signed)

## 2013-08-30 NOTE — Telephone Encounter (Signed)
Nothing he can switch to. If he can't tolerate. Ok to stop

## 2013-08-30 NOTE — ED Provider Notes (Signed)
CSN: [id number]     Arrival date & time 08/30/13  2044 History  This chart was scribed for Gilda Crease, MD by Ardelia Mems, ED Scribe. This patient was seen in room APA17/APA17 and the patient's care was started at 8:57 PM.   Chief Complaint  Patient presents with  . Leg Swelling    The history is provided by the patient. No language interpreter was used.    HPI Comments: [name] is a 73 y.o. Male with a history of CAD, HTN and acute renal failure who presents to the Emergency Department complaining of bilateral lower leg swelling, right worse than left, that began 2 weeks ago and worsened over the past 2 days. He reports associated pain described as soreness to his bilateral lower legs. He reports that he is not on a fluid pill. He denies SOB, chest pain or any other symptoms.   Past Medical History  Diagnosis Date  . Hyperlipidemia   . Small bowel problem     HAD ALOT OF SCAR TISSUE FROM PREVIOUS SURGERIES..NG WAS INSERTED ...Marland KitchenMarland KitchenNO SURGERY NEEDED...Marland KitchenMarland KitchenIN FOR 8 DAYS  . Disorder of blood     BEEN TREATED BY DERMATOLOGIST X 4 YRS..."BLOOD BLISTERS"  . Arthritis   . Gout   . Anxiety   . Hx MRSA infection     rt shoulder  . Pneumonia     "I've had it 3-4 times"  . Coronary artery disease     IN 2000   STENT PLACED IN 2012 sees Dr. Dietrich Pates, saw last 2013  . HTN (hypertension)     sees [name] in [place]  . Small bowel obstruction   . Ventral hernia   . Acute renal failure 04/17/2013  . 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 Sternal surg  2000    HAS HAD 5-6 ON HIS STERNUM; "caught MRSA in it"  . Lumbar laminectomy/decompression microdiscectomy  06/13/2011    Procedure: LUMBAR LAMINECTOMY/DECOMPRESSION MICRODISCECTOMY;  Surgeon: Karn Cassis;  Location: [facility];  Service: Neurosurgery;  Laterality: N/A;  Lumbar three, lumbar four-five Laminectomy  . Eye surgery      bilateral  cataract  . Anterior cervical corpectomy  12/17/11  . Incision and drainage of wound  ~ 20ll; 12/03/11    "had infection in my right"  . Shoulder open rotator cuff repair  ~ 2011    right  . Peripherally inserted central catheter insertion  2011 & 11/2011  . Cataract extraction w/ intraocular lens  implant, bilateral  ? 2011  . Coronary artery bypass graft  2000    CABG X5  . Back surgery      lumbar  . Tonsillectomy  1949  . Cholecystectomy  2006 "or after"  . Coronary angioplasty with stent placement  2012  . Anterior cervical corpectomy  12/17/2011    Procedure: ANTERIOR CERVICAL CORPECTOMY;  Surgeon: Karn Cassis, MD;  Location: [facility];  Service: Neurosurgery;  Laterality: N/A;  Anterior Cervical Decompression Fusion Five to Thoracic Two with plating  . Lumbar laminectomy/decompression microdiscectomy Right 06/17/2013    Procedure: LUMBAR LAMINECTOMY/DECOMPRESSION MICRODISCECTOMY 1 LEVEL;  Surgeon: Karn Cassis, MD;  Location: [facility];  Service: Neurosurgery;  Laterality: Right;  Right L3-4 Microdiskectomy  . Lumbar wound debridement N/A 08/02/2013    Procedure: INCISION AND DRAINAGE OF LUMBAR WOUND DEBRIDEMENT;  Surgeon: Karn Cassis, MD;  Location: [facility];  Service: Neurosurgery;  Laterality: N/A;   Family History  Problem Relation Age of Onset  . Heart disease    . Hypertension Mother   . Hypertension Maternal Aunt   . Hypertension Maternal Uncle   . Anesthesia problems Neg Hx   . Hypotension Neg Hx   . Malignant hyperthermia Neg Hx   . Pseudochol deficiency Neg Hx    History  Substance Use Topics  . Smoking status: Former Smoker -- 1.00 packs/day for 1 years    Types: Cigarettes    Quit date: 12/27/1998  . Smokeless tobacco: Never Used  . Alcohol Use: No    Review of Systems  Respiratory: Negative for shortness of breath.   Cardiovascular: Positive for leg swelling (bilateral). Negative for chest pain.  All other systems reviewed and are  negative.   Allergies  Morphine; Metoprolol; and Rifampin  Home Medications   Current Outpatient Rx  Name  Route  Sig  Dispense  Refill  . acetaminophen (TYLENOL) 325 MG tablet   Oral   Take 1-2 tablets (325-650 mg total) by mouth every 4 (four) hours as needed for mild pain.         Marland Kitchen atorvastatin (LIPITOR) 40 MG tablet   Oral   Take 1 tablet (40 mg total) by mouth daily.   90 tablet   2   . camphor-menthol (SARNA) lotion   Topical   Apply topically 3 (three) times daily after meals.   222 mL   1   . citalopram (CELEXA) 40 MG tablet   Oral   Take 40 mg by mouth daily.         . colchicine 0.6 MG tablet   Oral   Take 1 tablet (0.6 mg total) by mouth 2 (two) times daily.   60 tablet   1   . Cyanocobalamin (VITAMIN B 12 PO)   Oral   Take 1 tablet by mouth daily.         Marland Kitchen doxazosin (CARDURA) 4 MG tablet   Oral   Take 8 mg by mouth at bedtime.         . gabapentin (NEURONTIN) 300 MG capsule   Oral   Take 2 capsules (600 mg total) by mouth 3 (three) times daily.   180 capsule   1   . methocarbamol (ROBAXIN) 500 MG tablet   Oral   Take 1 tablet (500 mg total) by mouth 4 (four) times daily as needed for muscle spasms.   75 tablet   1   . nitroGLYCERIN (NITROSTAT) 0.4 MG SL tablet   Sublingual   Place 0.4 mg under the tongue every 5 (five) minutes as needed. For chest pain         . nortriptyline (PAMELOR) 10 MG capsule   Oral   Take 1 capsule (10 mg total) by mouth at bedtime.   30 capsule   1   . nystatin cream (MYCOSTATIN)   Topical   Apply topically 2 (two) times daily.   30 g   1   . OxyCODONE (OXYCONTIN) 20 mg T12A 12 hr tablet   Oral   Take 1 tablet (20 mg total) by mouth every 12 (twelve) hours.   60 tablet   0   . oxyCODONE 20 MG TABS   Oral   Take 1 tablet (20 mg total) by mouth every 6 (six) hours as needed for moderate pain.   120 tablet   0   . predniSONE (DELTASONE) 20  MG tablet   Oral   Take 1 tablet (20 mg total)  by mouth 2 (two) times daily with a meal. Start taper of one pill in am and 1/2 pill in pm for 5 days. Then decrease to one pill in am daily.   45 tablet   0   . rifampin (RIFADIN) 300 MG capsule   Oral   Take 1 capsule (300 mg total) by mouth every 12 (twelve) hours.   60 capsule   1   . sulfamethoxazole-trimethoprim (BACTRIM DS) 800-160 MG per tablet   Oral   Take 1 tablet by mouth every 12 (twelve) hours.   60 tablet   1   . tamsulosin (FLOMAX) 0.4 MG CAPS capsule   Oral   Take 1 capsule (0.4 mg total) by mouth daily after breakfast.   30 capsule   1   . vitamin C (ASCORBIC ACID) 500 MG tablet   Oral   Take 500 mg by mouth daily.         Marland Kitchen. VITAMIN D, CHOLECALCIFEROL, PO   Oral   Take 2-3 tablets by mouth daily.          Triage Vitals: BP 152/65  Pulse 79  Temp(Src) 98.1 F (36.7 C) (Oral)  Resp 18  Ht 6' (1.829 m)  Wt 190 lb (86.183 kg)  BMI 25.76 kg/m2  SpO2 96%  Physical Exam  Constitutional: He is oriented to person, place, and time. He appears well-developed and well-nourished. No distress.  HENT:  Head: Normocephalic and atraumatic.  Right Ear: Hearing normal.  Left Ear: Hearing normal.  Nose: Nose normal.  Mouth/Throat: Oropharynx is clear and moist and mucous membranes are normal.  Eyes: Conjunctivae and EOM are normal. Pupils are equal, round, and reactive to light.  Neck: Normal range of motion. Neck supple.  Cardiovascular: Regular rhythm, S1 normal and S2 normal.  Exam reveals no gallop and no friction rub.   No murmur heard. Pulmonary/Chest: Effort normal and breath sounds normal. No respiratory distress. He exhibits no tenderness.  Abdominal: Soft. Normal appearance and bowel sounds are normal. There is no hepatosplenomegaly. There is no tenderness. There is no rebound, no guarding, no tenderness at McBurney's point and negative Murphy's sign. No hernia.  Musculoskeletal: Normal range of motion. He exhibits edema.  3+ pitting bilateral  lower extremity edema, right slightly worse than left  Neurological: He is alert and oriented to person, place, and time. He has normal strength. No cranial nerve deficit or sensory deficit. Coordination normal. GCS eye subscore is 4. GCS verbal subscore is 5. GCS motor subscore is 6.  Skin: Skin is warm, dry and intact. No rash noted. No cyanosis.  Psychiatric: He has a normal mood and affect. His speech is normal and behavior is normal. Thought content normal.    ED Course  Procedures (including critical care time)  DIAGNOSTIC STUDIES: Oxygen Saturation is 96% on RA, adequate by my interpretation.    COORDINATION OF CARE: 9:01 PM- Discussed plan to obtain diagnostic lab work and radiology. Pt advised of plan for treatment and pt agrees.  Labs Review Labs Reviewed  CBC WITH DIFFERENTIAL - Abnormal; Notable for the following:    RBC 3.15 (*)    Hemoglobin 9.5 (*)    HCT 28.6 (*)    RDW 16.1 (*)    Eosinophils Relative 17 (*)    Eosinophils Absolute 1.2 (*)    All other components within normal limits  COMPREHENSIVE METABOLIC PANEL - Abnormal; Notable for  the following:    Potassium 5.6 (*)    Glucose, Bld 133 (*)    Creatinine, Ser 1.39 (*)    Albumin 3.2 (*)    Total Bilirubin <0.2 (*)    GFR calc non Af Amer 50 (*)    GFR calc Af Amer 58 (*)    All other components within normal limits  PRO B NATRIURETIC PEPTIDE - Abnormal; Notable for the following:    Pro B Natriuretic peptide (BNP) 738.4 (*)    All other components within normal limits  TROPONIN I  URINALYSIS, ROUTINE W REFLEX MICROSCOPIC   Imaging Review Dg Chest 2 View  08/30/2013   CLINICAL DATA:  Bilateral lower extremity edema.  EXAM: CHEST  2 VIEW  COMPARISON:  Single view of the chest 08/04/2013. PA and lateral chest 06/17/2013 and 12/12/2011.  FINDINGS: Heart size is upper normal. There is no pulmonary edema, consolidative process or pneumothorax. Chronic blunting of right costophrenic angle is compatible with  scar. Calcified granulomata on the right are again seen.  IMPRESSION: No acute disease.  Stable compared to prior exams.   Electronically Signed   By: Drusilla Kanner M.D.   On: 08/30/2013 22:00     EKG Interpretation    Date/Time:  Tuesday August 30 2013 21:25:57 EST Ventricular Rate:  70 PR Interval:  152 QRS Duration: 86 QT Interval:  378 QTC Calculation: 408 R Axis:   55 Text Interpretation:  Normal sinus rhythm Normal ECG When compared with ECG of 17-Apr-2013 03:41, Premature ventricular complexes are no longer Present Confirmed by Shylo Zamor  MD, Rj Pedrosa ([id number]) on 08/30/2013 9:47:12 PM            MDM  Diagnosis: Lower extremity edema  Presents to the ER for evaluation of swelling of the legs. Patient has recently had lumbar surgery and now has not been ambulatory since November. Family is concerned about some redness of the legs. There is very faint erythema, but no one I do not suspect cellulitis at this time. I suspect that the redness is secondary to stasis dermatitis. His swelling is secondary to venous stasis from his immobility and lack of ambulating. He has been using compression stockings without improvement. Patient has followup with his primary care doctor, Doctor Juanetta Gosling in the morning. He was given IV Lasix here in the ER, will be allowed to diuresis, then discharged.   I personally performed the services described in this documentation, which was scribed in my presence. The recorded information has been reviewed and is accurate.    Gilda Crease, MD 08/30/13 [phone]

## 2013-08-31 DIAGNOSIS — I10 Essential (primary) hypertension: Secondary | ICD-10-CM | POA: Diagnosis not present

## 2013-08-31 DIAGNOSIS — R609 Edema, unspecified: Secondary | ICD-10-CM | POA: Diagnosis not present

## 2013-08-31 DIAGNOSIS — I509 Heart failure, unspecified: Secondary | ICD-10-CM | POA: Diagnosis not present

## 2013-08-31 DIAGNOSIS — I259 Chronic ischemic heart disease, unspecified: Secondary | ICD-10-CM | POA: Diagnosis not present

## 2013-08-31 NOTE — ED Provider Notes (Signed)
1:10 AM patient feels ready to home. He is in no distress. Appears comfortable.  Doug SouSam Delane Wessinger, MD 08/31/13 [phone]

## 2013-09-01 DIAGNOSIS — N319 Neuromuscular dysfunction of bladder, unspecified: Secondary | ICD-10-CM | POA: Diagnosis not present

## 2013-09-01 DIAGNOSIS — S60929A Unspecified superficial injury of unspecified hand, initial encounter: Secondary | ICD-10-CM | POA: Diagnosis not present

## 2013-09-01 DIAGNOSIS — I2510 Atherosclerotic heart disease of native coronary artery without angina pectoris: Secondary | ICD-10-CM | POA: Diagnosis not present

## 2013-09-01 DIAGNOSIS — I10 Essential (primary) hypertension: Secondary | ICD-10-CM | POA: Diagnosis not present

## 2013-09-01 DIAGNOSIS — S50909A Unspecified superficial injury of unspecified elbow, initial encounter: Secondary | ICD-10-CM | POA: Diagnosis not present

## 2013-09-01 DIAGNOSIS — Z4789 Encounter for other orthopedic aftercare: Secondary | ICD-10-CM | POA: Diagnosis not present

## 2013-09-02 ENCOUNTER — Encounter (HOSPITAL_COMMUNITY): Payer: Self-pay | Admitting: Emergency Medicine

## 2013-09-02 ENCOUNTER — Emergency Department (HOSPITAL_COMMUNITY)
Admission: EM | Admit: 2013-09-02 | Discharge: 2013-09-02 | Disposition: A | Discharge status: Home / Self Care | Payer: Medicare Other | Source: Home / Self Care | Attending: Emergency Medicine | Admitting: Emergency Medicine

## 2013-09-02 DIAGNOSIS — N179 Acute kidney failure, unspecified: Secondary | ICD-10-CM | POA: Diagnosis not present

## 2013-09-02 DIAGNOSIS — T8140XA Infection following a procedure, unspecified, initial encounter: Secondary | ICD-10-CM | POA: Diagnosis not present

## 2013-09-02 DIAGNOSIS — N398 Other specified disorders of urinary system: Secondary | ICD-10-CM | POA: Diagnosis not present

## 2013-09-02 DIAGNOSIS — Z951 Presence of aortocoronary bypass graft: Secondary | ICD-10-CM

## 2013-09-02 DIAGNOSIS — R300 Dysuria: Secondary | ICD-10-CM | POA: Insufficient documentation

## 2013-09-02 DIAGNOSIS — M8708 Idiopathic aseptic necrosis of bone, other site: Secondary | ICD-10-CM | POA: Diagnosis not present

## 2013-09-02 DIAGNOSIS — M25519 Pain in unspecified shoulder: Secondary | ICD-10-CM | POA: Diagnosis not present

## 2013-09-02 DIAGNOSIS — Z9861 Coronary angioplasty status: Secondary | ICD-10-CM

## 2013-09-02 DIAGNOSIS — R110 Nausea: Secondary | ICD-10-CM

## 2013-09-02 DIAGNOSIS — N319 Neuromuscular dysfunction of bladder, unspecified: Secondary | ICD-10-CM | POA: Diagnosis not present

## 2013-09-02 DIAGNOSIS — S50909A Unspecified superficial injury of unspecified elbow, initial encounter: Secondary | ICD-10-CM | POA: Diagnosis not present

## 2013-09-02 DIAGNOSIS — E785 Hyperlipidemia, unspecified: Secondary | ICD-10-CM | POA: Diagnosis not present

## 2013-09-02 DIAGNOSIS — R0989 Other specified symptoms and signs involving the circulatory and respiratory systems: Secondary | ICD-10-CM | POA: Diagnosis not present

## 2013-09-02 DIAGNOSIS — M19019 Primary osteoarthritis, unspecified shoulder: Secondary | ICD-10-CM | POA: Diagnosis not present

## 2013-09-02 DIAGNOSIS — I059 Rheumatic mitral valve disease, unspecified: Secondary | ICD-10-CM | POA: Diagnosis not present

## 2013-09-02 DIAGNOSIS — F411 Generalized anxiety disorder: Secondary | ICD-10-CM | POA: Insufficient documentation

## 2013-09-02 DIAGNOSIS — R0609 Other forms of dyspnea: Secondary | ICD-10-CM | POA: Diagnosis not present

## 2013-09-02 DIAGNOSIS — Z5189 Encounter for other specified aftercare: Secondary | ICD-10-CM | POA: Diagnosis not present

## 2013-09-02 DIAGNOSIS — R109 Unspecified abdominal pain: Secondary | ICD-10-CM | POA: Insufficient documentation

## 2013-09-02 DIAGNOSIS — N390 Urinary tract infection, site not specified: Secondary | ICD-10-CM | POA: Diagnosis not present

## 2013-09-02 DIAGNOSIS — M25439 Effusion, unspecified wrist: Secondary | ICD-10-CM | POA: Diagnosis not present

## 2013-09-02 DIAGNOSIS — A498 Other bacterial infections of unspecified site: Secondary | ICD-10-CM | POA: Diagnosis not present

## 2013-09-02 DIAGNOSIS — R509 Fever, unspecified: Secondary | ICD-10-CM | POA: Diagnosis not present

## 2013-09-02 DIAGNOSIS — Z8701 Personal history of pneumonia (recurrent): Secondary | ICD-10-CM | POA: Insufficient documentation

## 2013-09-02 DIAGNOSIS — R279 Unspecified lack of coordination: Secondary | ICD-10-CM | POA: Diagnosis not present

## 2013-09-02 DIAGNOSIS — D509 Iron deficiency anemia, unspecified: Secondary | ICD-10-CM | POA: Diagnosis not present

## 2013-09-02 DIAGNOSIS — M5126 Other intervertebral disc displacement, lumbar region: Secondary | ICD-10-CM | POA: Diagnosis not present

## 2013-09-02 DIAGNOSIS — S43429A Sprain of unspecified rotator cuff capsule, initial encounter: Secondary | ICD-10-CM | POA: Diagnosis not present

## 2013-09-02 DIAGNOSIS — Z8719 Personal history of other diseases of the digestive system: Secondary | ICD-10-CM

## 2013-09-02 DIAGNOSIS — Z79899 Other long term (current) drug therapy: Secondary | ICD-10-CM | POA: Insufficient documentation

## 2013-09-02 DIAGNOSIS — D649 Anemia, unspecified: Secondary | ICD-10-CM | POA: Diagnosis not present

## 2013-09-02 DIAGNOSIS — X58XXXA Exposure to other specified factors, initial encounter: Secondary | ICD-10-CM | POA: Diagnosis not present

## 2013-09-02 DIAGNOSIS — S4980XA Other specified injuries of shoulder and upper arm, unspecified arm, initial encounter: Secondary | ICD-10-CM | POA: Diagnosis not present

## 2013-09-02 DIAGNOSIS — M25419 Effusion, unspecified shoulder: Secondary | ICD-10-CM | POA: Diagnosis present

## 2013-09-02 DIAGNOSIS — J811 Chronic pulmonary edema: Secondary | ICD-10-CM | POA: Diagnosis not present

## 2013-09-02 DIAGNOSIS — S60929A Unspecified superficial injury of unspecified hand, initial encounter: Secondary | ICD-10-CM | POA: Diagnosis not present

## 2013-09-02 DIAGNOSIS — I25810 Atherosclerosis of coronary artery bypass graft(s) without angina pectoris: Secondary | ICD-10-CM | POA: Diagnosis not present

## 2013-09-02 DIAGNOSIS — Z8614 Personal history of Methicillin resistant Staphylococcus aureus infection: Secondary | ICD-10-CM | POA: Insufficient documentation

## 2013-09-02 DIAGNOSIS — R6889 Other general symptoms and signs: Secondary | ICD-10-CM | POA: Diagnosis not present

## 2013-09-02 DIAGNOSIS — R799 Abnormal finding of blood chemistry, unspecified: Secondary | ICD-10-CM | POA: Diagnosis not present

## 2013-09-02 DIAGNOSIS — I5041 Acute combined systolic (congestive) and diastolic (congestive) heart failure: Secondary | ICD-10-CM | POA: Diagnosis not present

## 2013-09-02 DIAGNOSIS — R339 Retention of urine, unspecified: Secondary | ICD-10-CM | POA: Diagnosis not present

## 2013-09-02 DIAGNOSIS — Z4789 Encounter for other orthopedic aftercare: Secondary | ICD-10-CM | POA: Diagnosis not present

## 2013-09-02 DIAGNOSIS — K592 Neurogenic bowel, not elsewhere classified: Secondary | ICD-10-CM | POA: Diagnosis not present

## 2013-09-02 DIAGNOSIS — Z87448 Personal history of other diseases of urinary system: Secondary | ICD-10-CM | POA: Insufficient documentation

## 2013-09-02 DIAGNOSIS — M6281 Muscle weakness (generalized): Secondary | ICD-10-CM | POA: Diagnosis not present

## 2013-09-02 DIAGNOSIS — Z87891 Personal history of nicotine dependence: Secondary | ICD-10-CM | POA: Diagnosis not present

## 2013-09-02 DIAGNOSIS — I2510 Atherosclerotic heart disease of native coronary artery without angina pectoris: Secondary | ICD-10-CM | POA: Diagnosis not present

## 2013-09-02 DIAGNOSIS — I10 Essential (primary) hypertension: Secondary | ICD-10-CM

## 2013-09-02 DIAGNOSIS — M1990 Unspecified osteoarthritis, unspecified site: Secondary | ICD-10-CM | POA: Diagnosis present

## 2013-09-02 DIAGNOSIS — I509 Heart failure, unspecified: Secondary | ICD-10-CM | POA: Diagnosis not present

## 2013-09-02 DIAGNOSIS — Z862 Personal history of diseases of the blood and blood-forming organs and certain disorders involving the immune mechanism: Secondary | ICD-10-CM

## 2013-09-02 DIAGNOSIS — M5137 Other intervertebral disc degeneration, lumbosacral region: Secondary | ICD-10-CM | POA: Diagnosis present

## 2013-09-02 DIAGNOSIS — M109 Gout, unspecified: Secondary | ICD-10-CM | POA: Diagnosis present

## 2013-09-02 DIAGNOSIS — Z981 Arthrodesis status: Secondary | ICD-10-CM | POA: Diagnosis not present

## 2013-09-02 DIAGNOSIS — IMO0002 Reserved for concepts with insufficient information to code with codable children: Secondary | ICD-10-CM

## 2013-09-02 DIAGNOSIS — M129 Arthropathy, unspecified: Secondary | ICD-10-CM

## 2013-09-02 DIAGNOSIS — R262 Difficulty in walking, not elsewhere classified: Secondary | ICD-10-CM | POA: Diagnosis not present

## 2013-09-02 DIAGNOSIS — I959 Hypotension, unspecified: Secondary | ICD-10-CM | POA: Diagnosis not present

## 2013-09-02 DIAGNOSIS — I5043 Acute on chronic combined systolic (congestive) and diastolic (congestive) heart failure: Secondary | ICD-10-CM | POA: Diagnosis not present

## 2013-09-02 DIAGNOSIS — N320 Bladder-neck obstruction: Secondary | ICD-10-CM | POA: Diagnosis present

## 2013-09-02 DIAGNOSIS — S46909A Unspecified injury of unspecified muscle, fascia and tendon at shoulder and upper arm level, unspecified arm, initial encounter: Secondary | ICD-10-CM | POA: Diagnosis not present

## 2013-09-02 DIAGNOSIS — B965 Pseudomonas (aeruginosa) (mallei) (pseudomallei) as the cause of diseases classified elsewhere: Secondary | ICD-10-CM | POA: Diagnosis not present

## 2013-09-02 DIAGNOSIS — M67919 Unspecified disorder of synovium and tendon, unspecified shoulder: Secondary | ICD-10-CM | POA: Diagnosis not present

## 2013-09-02 DIAGNOSIS — R39198 Other difficulties with micturition: Secondary | ICD-10-CM

## 2013-09-02 DIAGNOSIS — L089 Local infection of the skin and subcutaneous tissue, unspecified: Secondary | ICD-10-CM | POA: Diagnosis not present

## 2013-09-02 DIAGNOSIS — R4182 Altered mental status, unspecified: Secondary | ICD-10-CM | POA: Diagnosis not present

## 2013-09-02 DIAGNOSIS — Z8249 Family history of ischemic heart disease and other diseases of the circulatory system: Secondary | ICD-10-CM | POA: Diagnosis not present

## 2013-09-02 DIAGNOSIS — G834 Cauda equina syndrome: Secondary | ICD-10-CM | POA: Diagnosis not present

## 2013-09-02 LAB — URINALYSIS, ROUTINE W REFLEX MICROSCOPIC
Bilirubin Urine: NEGATIVE
GLUCOSE, UA: NEGATIVE mg/dL
Ketones, ur: NEGATIVE mg/dL
LEUKOCYTES UA: NEGATIVE
Nitrite: NEGATIVE
Protein, ur: NEGATIVE mg/dL
Specific Gravity, Urine: >1.03 — ABNORMAL HIGH (ref 1.005–1.030)
Urobilinogen, UA: 0.2 mg/dL (ref 0.0–1.0)
pH: 5 (ref 5.0–8.0)

## 2013-09-02 LAB — URINE MICROSCOPIC-ADD ON

## 2013-09-02 NOTE — Discharge Instructions (Signed)
RETURN FOR ANY NEW PAIN, NEW WEAKNESS, IF YOU ARE UNABLE TO PASS ANY URINE, FEVER OVER 100.79F, VOMITING OVER NEXT 12-24 HOURS

## 2013-09-02 NOTE — ED Provider Notes (Signed)
CSN: [id number]     Arrival date & time 09/02/13  4098 History  This chart was scribed for Joya Gaskins, MD by Ardelia Mems, ED Scribe. This patient was seen in room APA14/APA14 and the patient's care was started at 9:12 AM.   Chief Complaint  Patient presents with  . Urinary Retention    Patient is a 72 y.o. male presenting with general illness. The history is provided by the patient. No language interpreter was used.  Illness Location:  Urinary retention Severity:  Moderate Onset quality:  Gradual Duration:  7 hours Timing:  Constant Progression:  Worsening Chronicity:  Recurrent Associated symptoms: abdominal pain and nausea   Associated symptoms: no fever and no vomiting     HPI Comments: [name] is a 72 y.o. male who presents to the Emergency Department complaining of urinary retention since 2 AM this morning. He states that he has been self catheterizing for "a while", but was unable to complete catheterization this morning. He reports associated intermittent nausea today and a gradual onset of abdominal pain described as pressure, but denies any other symptoms. He reports that he had a back surgery recently, and that he is unable to walk currently but no new leg weakness. Although he states he believes he is healing normally for this. He denies fever, vomiting, weakness in arms or legs or any other symptoms.   Past Medical History  Diagnosis Date  . Hyperlipidemia   . Small bowel problem     HAD ALOT OF SCAR TISSUE FROM PREVIOUS SURGERIES..NG WAS INSERTED ...Marland KitchenMarland KitchenNO SURGERY NEEDED...Marland KitchenMarland KitchenIN FOR 8 DAYS  . Disorder of blood     BEEN TREATED BY DERMATOLOGIST X 4 YRS..."BLOOD BLISTERS"  . Arthritis   . Gout   . Anxiety   . Hx MRSA infection     rt shoulder  . Pneumonia     "I've had it 3-4 times"  . Coronary artery disease     IN 2000   STENT PLACED IN 2012 sees Dr. Dietrich Pates, saw last 2013  . HTN (hypertension)     sees [name] in [place]  . Small bowel  obstruction   . Ventral hernia   . Acute renal failure 04/17/2013  . 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 Sternal surg  2000    HAS HAD 5-6 ON HIS STERNUM; "caught MRSA in it"  . Lumbar laminectomy/decompression microdiscectomy  06/13/2011    Procedure: LUMBAR LAMINECTOMY/DECOMPRESSION MICRODISCECTOMY;  Surgeon: Karn Cassis;  Location: [facility];  Service: Neurosurgery;  Laterality: N/A;  Lumbar three, lumbar four-five Laminectomy  . Eye surgery      bilateral cataract  . Anterior cervical corpectomy  12/17/11  . Incision and drainage of wound  ~ 20ll; 12/03/11    "had infection in my right"  . Shoulder open rotator cuff repair  ~ 2011    right  . Peripherally inserted central catheter insertion  2011 & 11/2011  . Cataract extraction w/ intraocular lens  implant, bilateral  ? 2011  . Coronary artery bypass graft  2000    CABG X5  . Back surgery      lumbar  . Tonsillectomy  1949  . Cholecystectomy  2006 "or after"  . Coronary angioplasty with stent placement  2012  . Anterior cervical corpectomy  12/17/2011    Procedure: ANTERIOR CERVICAL CORPECTOMY;  Surgeon: Karn Cassis, MD;  [facility]: [facility];  Service: Neurosurgery;  Laterality: N/A;  Anterior Cervical Decompression Fusion Five to Thoracic Two with plating  . Lumbar laminectomy/decompression microdiscectomy Right 06/17/2013    Procedure: LUMBAR LAMINECTOMY/DECOMPRESSION MICRODISCECTOMY 1 LEVEL;  Surgeon: Karn CassisErnesto M Botero, MD;  Location: [facility];  Service: Neurosurgery;  Laterality: Right;  Right L3-4 Microdiskectomy  . Lumbar wound debridement N/A 08/02/2013    Procedure: INCISION AND DRAINAGE OF LUMBAR WOUND DEBRIDEMENT;  Surgeon: Karn CassisErnesto M Botero, MD;  Location: [facility];  Service: Neurosurgery;  Laterality: N/A;   Family History  Problem Relation Age of Onset  . Heart disease    . Hypertension Mother   . Hypertension Maternal Aunt    . Hypertension Maternal Uncle   . Anesthesia problems Neg Hx   . Hypotension Neg Hx   . Malignant hyperthermia Neg Hx   . Pseudochol deficiency Neg Hx    History  Substance Use Topics  . Smoking status: Former Smoker -- 1.00 packs/day for 1 years    Types: Cigarettes    Quit date: 12/27/1998  . Smokeless tobacco: Never Used  . Alcohol Use: No    Review of Systems  Constitutional: Negative for fever.  Gastrointestinal: Positive for nausea and abdominal pain. Negative for vomiting.  Genitourinary: Positive for decreased urine volume (urinary retention).  Neurological: Negative for weakness.  All other systems reviewed and are negative.   Allergies  Morphine; Metoprolol; and Rifampin  Home Medications   Current Outpatient Rx  Name  Route  Sig  Dispense  Refill  . acetaminophen (TYLENOL) 325 MG tablet   Oral   Take 1-2 tablets (325-650 mg total) by mouth every 4 (four) hours as needed for mild pain.         Marland Kitchen. atorvastatin (LIPITOR) 40 MG tablet   Oral   Take 1 tablet (40 mg total) by mouth daily.   90 tablet   2   . camphor-menthol (SARNA) lotion   Topical   Apply topically 3 (three) times daily after meals.   222 mL   1   . citalopram (CELEXA) 40 MG tablet   Oral   Take 40 mg by mouth daily.         . colchicine 0.6 MG tablet   Oral   Take 1 tablet (0.6 mg total) by mouth 2 (two) times daily.   60 tablet   1   . Cyanocobalamin (VITAMIN B 12 PO)   Oral   Take 1 tablet by mouth daily.         Marland Kitchen. doxazosin (CARDURA) 4 MG tablet   Oral   Take 8 mg by mouth at bedtime.         . gabapentin (NEURONTIN) 300 MG capsule   Oral   Take 2 capsules (600 mg total) by mouth 3 (three) times daily.   180 capsule   1   . methocarbamol (ROBAXIN) 500 MG tablet   Oral   Take 1 tablet (500 mg total) by mouth 4 (four) times daily as needed for muscle spasms.   75 tablet   1   . nitroGLYCERIN (NITROSTAT) 0.4 MG SL tablet   Sublingual   Place 0.4 mg under  the tongue every 5 (five) minutes as needed. For chest pain         . nystatin cream (MYCOSTATIN)   Topical   Apply topically 2 (two) times daily.   30 g   1   . OxyCODONE (OXYCONTIN) 20 mg T12A 12 hr tablet  Oral   Take 1 tablet (20 mg total) by mouth every 12 (twelve) hours.   60 tablet   0   . oxyCODONE 20 MG TABS   Oral   Take 1 tablet (20 mg total) by mouth every 6 (six) hours as needed for moderate pain.   120 tablet   0   . predniSONE (DELTASONE) 20 MG tablet   Oral   Take 1 tablet (20 mg total) by mouth 2 (two) times daily with a meal. Start taper of one pill in am and 1/2 pill in pm for 5 days. Then decrease to one pill in am daily.   45 tablet   0   . sulfamethoxazole-trimethoprim (BACTRIM DS) 800-160 MG per tablet   Oral   Take 1 tablet by mouth every 12 (twelve) hours.   60 tablet   1   . tamsulosin (FLOMAX) 0.4 MG CAPS capsule   Oral   Take 1 capsule (0.4 mg total) by mouth daily after breakfast.   30 capsule   1   . vitamin C (ASCORBIC ACID) 500 MG tablet   Oral   Take 500 mg by mouth daily.         Marland Kitchen VITAMIN D, CHOLECALCIFEROL, PO   Oral   Take 2-3 tablets by mouth daily.          Triage Vitals: BP 150/59  Pulse 71  Temp(Src) 98.3 F (36.8 C) (Oral)  Resp 19  Ht 6' (1.829 m)  Wt 190 lb (86.183 kg)  BMI 25.76 kg/m2  SpO2 96%  Physical Exam CONSTITUTIONAL: Well developed/well nourished HEAD: Normocephalic/atraumatic EYES: EOMI ENMT: Mucous membranes moist NECK: supple no meningeal signs CV: S1/S2 noted LUNGS: Lungs are clear to auscultation bilaterally, no apparent distress ABDOMEN: soft, mild suprapubic tenderness, no rebound or guarding NEURO: Pt is awake/alert, moves all extremitiesx4 EXTREMITIES: pulses normal, full ROM SKIN: warm, color normal PSYCH: no abnormalities of mood noted  ED Course  Procedures (including critical care time)  DIAGNOSTIC STUDIES: Oxygen Saturation is 96% on RA, normal by my interpretation.     COORDINATION OF CARE: 9:18 AM- Discussed plan to insert a foley catheter. Will also perform UA. Pt advised of plan for treatment and pt agrees. Pt with h/o neurogenic bladder.  He usually self caths at home He feels is otherwise at baseline, no new weakness or back pain reported  Pt with minimal urine output but nurse reports he did have urine in his diaper Pt feels improved and denies any other new complaints He would like to be discharge without foley and will continue self cath at home  Labs Review Labs Reviewed  URINE CULTURE  URINALYSIS, ROUTINE W REFLEX MICROSCOPIC   Imaging Review No results found.  EKG Interpretation   None       MDM  No diagnosis found. Nursing notes including past medical history and social history reviewed and considered in documentation  Labs/vital reviewed and considered Previous records reviewed and considered    I personally performed the services described in this documentation, which was scribed in my presence. The recorded information has been reviewed and is accurate.      Joya Gaskins, MD 09/02/13 [phone]

## 2013-09-02 NOTE — ED Notes (Signed)
Discharge instructions reviewed with pt, questions answered. Pt verbalized understanding.  

## 2013-09-02 NOTE — ED Notes (Signed)
Patient self-caths at home. Patient reports being unable to get any urine return since 6 this morning. Patient reports starting to feel pressure in abd.

## 2013-09-04 LAB — URINE CULTURE: Colony Count: >=100000

## 2013-09-05 ENCOUNTER — Encounter (HOSPITAL_COMMUNITY): Payer: Self-pay | Admitting: Emergency Medicine

## 2013-09-05 ENCOUNTER — Emergency Department (HOSPITAL_COMMUNITY): Payer: Medicare Other

## 2013-09-05 ENCOUNTER — Inpatient Hospital Stay (HOSPITAL_COMMUNITY): Payer: Medicare Other

## 2013-09-05 ENCOUNTER — Inpatient Hospital Stay: Payer: Medicare Other | Admitting: Infectious Diseases

## 2013-09-05 ENCOUNTER — Inpatient Hospital Stay (HOSPITAL_COMMUNITY)
Admission: EM | Admit: 2013-09-05 | Discharge: 2013-09-20 | DRG: 500 | Disposition: A | Discharge status: Skilled Nursing Facility | Payer: Medicare Other | Attending: Neurosurgery | Admitting: Neurosurgery

## 2013-09-05 DIAGNOSIS — S43429A Sprain of unspecified rotator cuff capsule, initial encounter: Principal | ICD-10-CM | POA: Diagnosis present

## 2013-09-05 DIAGNOSIS — N179 Acute kidney failure, unspecified: Secondary | ICD-10-CM

## 2013-09-05 DIAGNOSIS — Z9861 Coronary angioplasty status: Secondary | ICD-10-CM

## 2013-09-05 DIAGNOSIS — F411 Generalized anxiety disorder: Secondary | ICD-10-CM | POA: Diagnosis not present

## 2013-09-05 DIAGNOSIS — N320 Bladder-neck obstruction: Secondary | ICD-10-CM | POA: Diagnosis present

## 2013-09-05 DIAGNOSIS — M12811 Other specific arthropathies, not elsewhere classified, right shoulder: Secondary | ICD-10-CM

## 2013-09-05 DIAGNOSIS — S60929A Unspecified superficial injury of unspecified hand, initial encounter: Secondary | ICD-10-CM | POA: Diagnosis not present

## 2013-09-05 DIAGNOSIS — G834 Cauda equina syndrome: Secondary | ICD-10-CM | POA: Diagnosis present

## 2013-09-05 DIAGNOSIS — S40012A Contusion of left shoulder, initial encounter: Secondary | ICD-10-CM

## 2013-09-05 DIAGNOSIS — M5137 Other intervertebral disc degeneration, lumbosacral region: Secondary | ICD-10-CM | POA: Diagnosis present

## 2013-09-05 DIAGNOSIS — S60919A Unspecified superficial injury of unspecified wrist, initial encounter: Secondary | ICD-10-CM | POA: Diagnosis not present

## 2013-09-05 DIAGNOSIS — M25512 Pain in left shoulder: Secondary | ICD-10-CM

## 2013-09-05 DIAGNOSIS — Z8249 Family history of ischemic heart disease and other diseases of the circulatory system: Secondary | ICD-10-CM

## 2013-09-05 DIAGNOSIS — Z981 Arthrodesis status: Secondary | ICD-10-CM

## 2013-09-05 DIAGNOSIS — E785 Hyperlipidemia, unspecified: Secondary | ICD-10-CM

## 2013-09-05 DIAGNOSIS — R339 Retention of urine, unspecified: Secondary | ICD-10-CM | POA: Diagnosis present

## 2013-09-05 DIAGNOSIS — R509 Fever, unspecified: Secondary | ICD-10-CM

## 2013-09-05 DIAGNOSIS — R7989 Other specified abnormal findings of blood chemistry: Secondary | ICD-10-CM

## 2013-09-05 DIAGNOSIS — Z951 Presence of aortocoronary bypass graft: Secondary | ICD-10-CM

## 2013-09-05 DIAGNOSIS — I10 Essential (primary) hypertension: Secondary | ICD-10-CM | POA: Diagnosis not present

## 2013-09-05 DIAGNOSIS — M51379 Other intervertebral disc degeneration, lumbosacral region without mention of lumbar back pain or lower extremity pain: Secondary | ICD-10-CM | POA: Diagnosis present

## 2013-09-05 DIAGNOSIS — I959 Hypotension, unspecified: Secondary | ICD-10-CM

## 2013-09-05 DIAGNOSIS — N319 Neuromuscular dysfunction of bladder, unspecified: Secondary | ICD-10-CM | POA: Diagnosis not present

## 2013-09-05 DIAGNOSIS — X58XXXA Exposure to other specified factors, initial encounter: Secondary | ICD-10-CM

## 2013-09-05 DIAGNOSIS — R4182 Altered mental status, unspecified: Secondary | ICD-10-CM

## 2013-09-05 DIAGNOSIS — I509 Heart failure, unspecified: Secondary | ICD-10-CM

## 2013-09-05 DIAGNOSIS — Z87891 Personal history of nicotine dependence: Secondary | ICD-10-CM

## 2013-09-05 DIAGNOSIS — M1990 Unspecified osteoarthritis, unspecified site: Secondary | ICD-10-CM | POA: Diagnosis present

## 2013-09-05 DIAGNOSIS — M5136 Other intervertebral disc degeneration, lumbar region: Secondary | ICD-10-CM | POA: Diagnosis present

## 2013-09-05 DIAGNOSIS — R0600 Dyspnea, unspecified: Secondary | ICD-10-CM

## 2013-09-05 DIAGNOSIS — I5043 Acute on chronic combined systolic (congestive) and diastolic (congestive) heart failure: Secondary | ICD-10-CM | POA: Diagnosis present

## 2013-09-05 DIAGNOSIS — I2510 Atherosclerotic heart disease of native coronary artery without angina pectoris: Secondary | ICD-10-CM | POA: Diagnosis not present

## 2013-09-05 DIAGNOSIS — M109 Gout, unspecified: Secondary | ICD-10-CM | POA: Diagnosis present

## 2013-09-05 DIAGNOSIS — N390 Urinary tract infection, site not specified: Secondary | ICD-10-CM

## 2013-09-05 DIAGNOSIS — D509 Iron deficiency anemia, unspecified: Secondary | ICD-10-CM

## 2013-09-05 DIAGNOSIS — M51369 Other intervertebral disc degeneration, lumbar region without mention of lumbar back pain or lower extremity pain: Secondary | ICD-10-CM | POA: Diagnosis present

## 2013-09-05 DIAGNOSIS — I25810 Atherosclerosis of coronary artery bypass graft(s) without angina pectoris: Secondary | ICD-10-CM

## 2013-09-05 DIAGNOSIS — Z4789 Encounter for other orthopedic aftercare: Secondary | ICD-10-CM | POA: Diagnosis not present

## 2013-09-05 DIAGNOSIS — S50909A Unspecified superficial injury of unspecified elbow, initial encounter: Secondary | ICD-10-CM | POA: Diagnosis not present

## 2013-09-05 DIAGNOSIS — R778 Other specified abnormalities of plasma proteins: Secondary | ICD-10-CM | POA: Diagnosis present

## 2013-09-05 DIAGNOSIS — IMO0002 Reserved for concepts with insufficient information to code with codable children: Secondary | ICD-10-CM

## 2013-09-05 DIAGNOSIS — Y929 Unspecified place or not applicable: Secondary | ICD-10-CM

## 2013-09-05 DIAGNOSIS — M25419 Effusion, unspecified shoulder: Secondary | ICD-10-CM | POA: Diagnosis present

## 2013-09-05 DIAGNOSIS — M75101 Unspecified rotator cuff tear or rupture of right shoulder, not specified as traumatic: Secondary | ICD-10-CM

## 2013-09-05 DIAGNOSIS — I519 Heart disease, unspecified: Secondary | ICD-10-CM

## 2013-09-05 DIAGNOSIS — A498 Other bacterial infections of unspecified site: Secondary | ICD-10-CM | POA: Diagnosis present

## 2013-09-05 DIAGNOSIS — I059 Rheumatic mitral valve disease, unspecified: Secondary | ICD-10-CM | POA: Diagnosis present

## 2013-09-05 DIAGNOSIS — Z8614 Personal history of Methicillin resistant Staphylococcus aureus infection: Secondary | ICD-10-CM

## 2013-09-05 DIAGNOSIS — I5041 Acute combined systolic (congestive) and diastolic (congestive) heart failure: Secondary | ICD-10-CM

## 2013-09-05 HISTORY — DX: Iron deficiency anemia, unspecified: D50.9

## 2013-09-05 LAB — CBC WITH DIFFERENTIAL/PLATELET
Basophils Absolute: 0 10*3/uL (ref 0.0–0.1)
Basophils Relative: 0 % (ref 0–1)
EOS PCT: 2 % (ref 0–5)
Eosinophils Absolute: 0.2 10*3/uL (ref 0.0–0.7)
HEMATOCRIT: 25.9 % — AB (ref 39.0–52.0)
Hemoglobin: 8.7 g/dL — ABNORMAL LOW (ref 13.0–17.0)
Lymphocytes Relative: 4 % — ABNORMAL LOW (ref 12–46)
Lymphs Abs: 0.4 10*3/uL — ABNORMAL LOW (ref 0.7–4.0)
MCH: 29.7 pg (ref 26.0–34.0)
MCHC: 33.6 g/dL (ref 30.0–36.0)
MCV: 88.4 fL (ref 78.0–100.0)
MONO ABS: 1.2 10*3/uL — AB (ref 0.1–1.0)
Monocytes Relative: 11 % (ref 3–12)
Neutro Abs: 9.8 10*3/uL — ABNORMAL HIGH (ref 1.7–7.7)
Neutrophils Relative %: 84 % — ABNORMAL HIGH (ref 43–77)
Platelets: 141 10*3/uL — ABNORMAL LOW (ref 150–400)
RBC: 2.93 MIL/uL — AB (ref 4.22–5.81)
RDW: 16.1 % — ABNORMAL HIGH (ref 11.5–15.5)
WBC: 11.6 10*3/uL — AB (ref 4.0–10.5)

## 2013-09-05 LAB — URINALYSIS, ROUTINE W REFLEX MICROSCOPIC
BILIRUBIN URINE: NEGATIVE
Glucose, UA: NEGATIVE mg/dL
Hgb urine dipstick: NEGATIVE
Ketones, ur: NEGATIVE mg/dL
NITRITE: POSITIVE — AB
Protein, ur: NEGATIVE mg/dL
SPECIFIC GRAVITY, URINE: 1.008 (ref 1.005–1.030)
UROBILINOGEN UA: 0.2 mg/dL (ref 0.0–1.0)
pH: 6 (ref 5.0–8.0)

## 2013-09-05 LAB — COMPREHENSIVE METABOLIC PANEL
ALT: 11 U/L (ref 0–53)
AST: 17 U/L (ref 0–37)
Albumin: 2.9 g/dL — ABNORMAL LOW (ref 3.5–5.2)
Alkaline Phosphatase: 57 U/L (ref 39–117)
BUN: 13 mg/dL (ref 6–23)
CALCIUM: 8.6 mg/dL (ref 8.4–10.5)
CO2: 26 meq/L (ref 19–32)
Chloride: 97 mEq/L (ref 96–112)
Creatinine, Ser: 1.8 mg/dL — ABNORMAL HIGH (ref 0.50–1.35)
GFR, EST AFRICAN AMERICAN: 42 mL/min — AB (ref >90)
GFR, EST NON AFRICAN AMERICAN: 36 mL/min — AB (ref >90)
GLUCOSE: 115 mg/dL — AB (ref 70–99)
Potassium: 4.1 mEq/L (ref 3.7–5.3)
Sodium: 136 mEq/L — ABNORMAL LOW (ref 137–147)
Total Bilirubin: 0.3 mg/dL (ref 0.3–1.2)
Total Protein: 5.4 g/dL — ABNORMAL LOW (ref 6.0–8.3)

## 2013-09-05 LAB — CG4 I-STAT (LACTIC ACID): LACTIC ACID, VENOUS: 1.49 mmol/L (ref 0.5–2.2)

## 2013-09-05 LAB — URINE MICROSCOPIC-ADD ON

## 2013-09-05 MED ORDER — VANCOMYCIN HCL IN DEXTROSE 1-5 GM/200ML-% IV SOLN
1000.0000 mg | Freq: Once | INTRAVENOUS | Status: AC
  Administered 2013-09-05: 1000 mg via INTRAVENOUS
  Filled 2013-09-05: qty 200

## 2013-09-05 MED ORDER — DEXAMETHASONE SODIUM PHOSPHATE 4 MG/ML IJ SOLN
4.0000 mg | Freq: Four times a day (QID) | INTRAMUSCULAR | Status: DC
  Administered 2013-09-05 – 2013-09-07 (×7): 4 mg via INTRAVENOUS
  Filled 2013-09-05 (×12): qty 1

## 2013-09-05 MED ORDER — NITROGLYCERIN 0.4 MG SL SUBL: 0.4000 mg | SUBLINGUAL_TABLET | SUBLINGUAL | Status: DC | PRN

## 2013-09-05 MED ORDER — HEPARIN SODIUM (PORCINE) 5000 UNIT/ML IJ SOLN
5000.0000 [IU] | Freq: Three times a day (TID) | INTRAMUSCULAR | Status: DC
  Administered 2013-09-05 – 2013-09-07 (×6): 5000 [IU] via SUBCUTANEOUS
  Filled 2013-09-05 (×10): qty 1

## 2013-09-05 MED ORDER — METHOCARBAMOL 500 MG PO TABS
500.0000 mg | ORAL_TABLET | Freq: Four times a day (QID) | ORAL | Status: DC | PRN
  Administered 2013-09-06 – 2013-09-20 (×7): 500 mg via ORAL
  Filled 2013-09-05 (×8): qty 1

## 2013-09-05 MED ORDER — SULFAMETHOXAZOLE-TMP DS 800-160 MG PO TABS
1.0000 | ORAL_TABLET | Freq: Two times a day (BID) | ORAL | Status: DC
  Administered 2013-09-05 – 2013-09-06 (×2): 1 via ORAL
  Filled 2013-09-05 (×3): qty 1

## 2013-09-05 MED ORDER — COLCHICINE 0.6 MG PO TABS
0.6000 mg | ORAL_TABLET | Freq: Two times a day (BID) | ORAL | Status: DC
  Administered 2013-09-05 – 2013-09-20 (×29): 0.6 mg via ORAL
  Filled 2013-09-05 (×33): qty 1

## 2013-09-05 MED ORDER — SODIUM CHLORIDE 0.9 % IV SOLN
INTRAVENOUS | Status: DC
  Administered 2013-09-05 – 2013-09-06 (×2): via INTRAVENOUS

## 2013-09-05 MED ORDER — ATORVASTATIN CALCIUM 40 MG PO TABS
40.0000 mg | ORAL_TABLET | Freq: Every day | ORAL | Status: DC
  Administered 2013-09-06 – 2013-09-20 (×15): 40 mg via ORAL
  Filled 2013-09-05 (×15): qty 1

## 2013-09-05 MED ORDER — SODIUM CHLORIDE 0.9 % IV SOLN: INTRAVENOUS | Status: DC

## 2013-09-05 MED ORDER — ACETAMINOPHEN 325 MG PO TABS
325.0000 mg | ORAL_TABLET | ORAL | Status: DC | PRN
  Administered 2013-09-06 – 2013-09-11 (×4): 650 mg via ORAL
  Filled 2013-09-05 (×4): qty 2

## 2013-09-05 MED ORDER — DEXTROSE 5 % IV SOLN
1.0000 g | Freq: Once | INTRAVENOUS | Status: AC
  Administered 2013-09-05: 1 g via INTRAVENOUS
  Filled 2013-09-05: qty 10

## 2013-09-05 MED ORDER — SODIUM CHLORIDE 0.9 % IV BOLUS (SEPSIS)
1000.0000 mL | Freq: Once | INTRAVENOUS | Status: AC
  Administered 2013-09-05: 1000 mL via INTRAVENOUS

## 2013-09-05 MED ORDER — OXYCODONE HCL ER 10 MG PO T12A
20.0000 mg | EXTENDED_RELEASE_TABLET | Freq: Two times a day (BID) | ORAL | Status: DC
  Administered 2013-09-05 – 2013-09-20 (×30): 20 mg via ORAL
  Filled 2013-09-05 (×30): qty 2

## 2013-09-05 MED ORDER — TAMSULOSIN HCL 0.4 MG PO CAPS
0.4000 mg | ORAL_CAPSULE | Freq: Every day | ORAL | Status: DC
  Administered 2013-09-06 – 2013-09-20 (×15): 0.4 mg via ORAL
  Filled 2013-09-05 (×17): qty 1

## 2013-09-05 MED ORDER — GABAPENTIN 300 MG PO CAPS
600.0000 mg | ORAL_CAPSULE | Freq: Three times a day (TID) | ORAL | Status: DC
  Administered 2013-09-05 – 2013-09-20 (×44): 600 mg via ORAL
  Filled 2013-09-05 (×47): qty 2

## 2013-09-05 MED ORDER — CITALOPRAM HYDROBROMIDE 40 MG PO TABS
40.0000 mg | ORAL_TABLET | Freq: Every day | ORAL | Status: DC
  Administered 2013-09-05 – 2013-09-19 (×15): 40 mg via ORAL
  Filled 2013-09-05 (×18): qty 1

## 2013-09-05 MED ORDER — DOXAZOSIN MESYLATE 8 MG PO TABS
8.0000 mg | ORAL_TABLET | Freq: Every day | ORAL | Status: DC
  Administered 2013-09-05 – 2013-09-19 (×14): 8 mg via ORAL
  Filled 2013-09-05 (×17): qty 1

## 2013-09-05 MED ORDER — GADOBENATE DIMEGLUMINE 529 MG/ML IV SOLN
10.0000 mL | Freq: Once | INTRAVENOUS | Status: AC | PRN
  Administered 2013-09-05: 9 mL via INTRAVENOUS

## 2013-09-05 MED ORDER — VITAMIN C 500 MG PO TABS
500.0000 mg | ORAL_TABLET | Freq: Every day | ORAL | Status: DC
  Administered 2013-09-05 – 2013-09-20 (×16): 500 mg via ORAL
  Filled 2013-09-05 (×16): qty 1

## 2013-09-05 NOTE — ED Notes (Signed)
When asked pt if he is being treated for a UTI pt denies, pt states he's here for his nerves and shaking, states started this morning, when asked pt if he is cold or hot he states no.

## 2013-09-05 NOTE — ED Notes (Signed)
Patient transported to MRI 

## 2013-09-05 NOTE — Progress Notes (Signed)
ED Antimicrobial Stewardship Positive Culture Follow Up   [name] is an 73 y.o. male who presented to [facility] on 09/02/2013 with a chief complaint of  Chief Complaint  Patient presents with  . Urinary Retention    Recent Results (from the past 720 hour(s))  URINE CULTURE     Status: None   Collection Time    08/15/13 12:01 PM      Result Value Range Status   Specimen Description URINE, CLEAN CATCH   Final   Special Requests vancomcyin and rocephin Immunocompromised   Final   Culture  Setup Time     Final   Value: 08/15/2013 17:05     Performed at [facility]   Colony Count     Final   Value: NO GROWTH     Performed at [facility]   Culture     Final   Value: NO GROWTH     Performed at [facility]   Report Status 08/17/2013 FINAL   Final  URINE CULTURE     Status: None   Collection Time    09/02/13  9:51 AM      Result Value Range Status   Specimen Description URINE, CATHETERIZED   Final   Special Requests NONE   Final   Culture  Setup Time     Final   Value: 09/03/2013 00:31     Performed at [facility]   Colony Count     Final   Value: >=100,000 COLONIES/ML     Performed at [facility]   Culture     Final   Value: PSEUDOMONAS AERUGINOSA     Performed at [facility]   Report Status 09/04/2013 FINAL   Final   Organism ID, Bacteria PSEUDOMONAS AERUGINOSA   Final    73 yo who came into the ED with urinary retention. He has been self cathing at home. He was unable to cath this AM. His urine culture grew out pseudomonas that is resistant to multiple abx. UA looks neg. This likely asymptomatic bacteruria due to cath. Will hold treatment.   ED Provider: Coral CeoJessica Palmer, PA  [name], PharmD Pager: [phone] Infectious Diseases Pharmacist Phone# [phone]

## 2013-09-05 NOTE — H&P (Signed)
[name] is an 73 y.o. male.   Chief Complaint: decrease loc HPI: patient who this am started to complain of shakeness, diaphoresis, decrease of loc, increase ot temperature. [facility]  Where he had a mri of the lumbar spine. Started on abs and we [facility]. He does not know why is here, has some mild headache and some pain in the left foot(hx of gout).   Past Medical History  Diagnosis Date  . Hyperlipidemia   . Small bowel problem     HAD ALOT OF SCAR TISSUE FROM PREVIOUS SURGERIES..NG WAS INSERTED ...Marland KitchenMarland KitchenNO SURGERY NEEDED...Marland KitchenMarland KitchenIN FOR 8 DAYS  . Disorder of blood     BEEN TREATED BY DERMATOLOGIST X 4 YRS..."BLOOD BLISTERS"  . Arthritis   . Gout   . Anxiety   . Hx MRSA infection     rt shoulder  . Pneumonia     "I've had it 3-4 times"  . Coronary artery disease     IN 2000   STENT PLACED IN 2012 sees Dr. Dietrich Pates, saw last 2013  . HTN (hypertension)     sees [name] in [place]  . Small bowel obstruction   . Ventral hernia   . Acute renal failure 04/17/2013  . 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 Sternal surg  2000    HAS HAD 5-6 ON HIS STERNUM; "caught MRSA in it"  . Lumbar laminectomy/decompression microdiscectomy  06/13/2011    Procedure: LUMBAR LAMINECTOMY/DECOMPRESSION MICRODISCECTOMY;  Surgeon: Karn Cassis;  Location: [facility];  Service: Neurosurgery;  Laterality: N/A;  Lumbar three, lumbar four-five Laminectomy  . Eye surgery      bilateral cataract  . Anterior cervical corpectomy  12/17/11  . Incision and drainage of wound  ~ 20ll; 12/03/11    "had infection in my right"  . Shoulder open rotator cuff repair  ~ 2011    right  . Peripherally inserted central catheter insertion  2011 & 11/2011  . Cataract extraction w/ intraocular lens  implant, bilateral  ? 2011  . Coronary artery bypass graft  2000    CABG X5  . Back surgery      lumbar   . Tonsillectomy  1949  . Cholecystectomy  2006 "or after"  . Coronary angioplasty with stent placement  2012  . Anterior cervical corpectomy  12/17/2011    Procedure: ANTERIOR CERVICAL CORPECTOMY;  Surgeon: Karn Cassis, MD;  Location: [facility];  Service: Neurosurgery;  Laterality: N/A;  Anterior Cervical Decompression Fusion Five to Thoracic Two with plating  . Lumbar laminectomy/decompression microdiscectomy Right 06/17/2013    Procedure: LUMBAR LAMINECTOMY/DECOMPRESSION MICRODISCECTOMY 1 LEVEL;  Surgeon: Karn Cassis, MD;  Location: [facility];  Service: Neurosurgery;  Laterality: Right;  Right L3-4 Microdiskectomy  . Lumbar wound debridement N/A 08/02/2013    Procedure: INCISION AND DRAINAGE OF LUMBAR WOUND DEBRIDEMENT;  Surgeon: Karn Cassis, MD;  Location: [facility];  Service: Neurosurgery;  Laterality: N/A;    Family History  Problem Relation Age of Onset  . Heart disease    . Hypertension Mother   . Hypertension Maternal Aunt   . Hypertension Maternal Uncle   . Anesthesia problems Neg Hx   . Hypotension Neg Hx   . Malignant hyperthermia Neg Hx   . Pseudochol deficiency Neg Hx    Social History:  reports  that he quit smoking about 14 years ago. His smoking use included Cigarettes. He has a 1 pack-year smoking history. He has never used smokeless tobacco. He reports that he does not drink alcohol or use illicit drugs.  Allergies:  Allergies  Allergen Reactions  . Morphine Other (See Comments)    REACTION: made him go crazy; "I had fun w/it; my family didn't think it was too funny"  . Metoprolol Other (See Comments)    REACTION:  Tachycardia; "don't remember how bad it was; it was so long ago"  . Rifampin     Itch     Medications Prior to Admission  Medication Sig Dispense Refill  . acetaminophen (TYLENOL) 325 MG tablet Take 1-2 tablets (325-650 mg total) by mouth every 4 (four) hours as needed for mild pain.      Marland Kitchen atorvastatin (LIPITOR) 40 MG tablet  Take 1 tablet (40 mg total) by mouth daily.  90 tablet  2  . camphor-menthol (SARNA) lotion Apply topically 3 (three) times daily after meals.  222 mL  1  . citalopram (CELEXA) 40 MG tablet Take 40 mg by mouth daily.      . colchicine 0.6 MG tablet Take 1 tablet (0.6 mg total) by mouth 2 (two) times daily.  60 tablet  1  . Cyanocobalamin (VITAMIN B 12 PO) Take 1 tablet by mouth daily.      Marland Kitchen doxazosin (CARDURA) 4 MG tablet Take 8 mg by mouth at bedtime.      . gabapentin (NEURONTIN) 300 MG capsule Take 2 capsules (600 mg total) by mouth 3 (three) times daily.  180 capsule  1  . methocarbamol (ROBAXIN) 500 MG tablet Take 1 tablet (500 mg total) by mouth 4 (four) times daily as needed for muscle spasms.  75 tablet  1  . nitroGLYCERIN (NITROSTAT) 0.4 MG SL tablet Place 0.4 mg under the tongue every 5 (five) minutes as needed. For chest pain      . OxyCODONE (OXYCONTIN) 20 mg T12A 12 hr tablet Take 1 tablet (20 mg total) by mouth every 12 (twelve) hours.  60 tablet  0  . predniSONE (DELTASONE) 5 MG tablet Take 5 mg by mouth daily with breakfast.      . sulfamethoxazole-trimethoprim (BACTRIM DS) 800-160 MG per tablet Take 1 tablet by mouth every 12 (twelve) hours.  60 tablet  1  . tamsulosin (FLOMAX) 0.4 MG CAPS capsule Take 1 capsule (0.4 mg total) by mouth daily after breakfast.  30 capsule  1  . vitamin C (ASCORBIC ACID) 500 MG tablet Take 500 mg by mouth daily.      Marland Kitchen VITAMIN D, CHOLECALCIFEROL, PO Take 2-3 tablets by mouth daily.        Results for orders placed during the hospital encounter of 09/05/13 (from the past 48 hour(s))  CBC WITH DIFFERENTIAL     Status: Abnormal   Collection Time    09/05/13 12:52 PM      Result Value Range   WBC 11.6 (*) 4.0 - 10.5 K/uL   RBC 2.93 (*) 4.22 - 5.81 MIL/uL   Hemoglobin 8.7 (*) 13.0 - 17.0 g/dL   HCT 25.9 (*) 39.0 - 52.0 %   MCV 88.4  78.0 - 100.0 fL   MCH 29.7  26.0 - 34.0 pg   MCHC 33.6  30.0 - 36.0 g/dL   RDW 16.1 (*) 11.5 - 15.5 %    Platelets 141 (*) 150 - 400 K/uL   Neutrophils Relative % 84 (*)  43 - 77 %   Neutro Abs 9.8 (*) 1.7 - 7.7 K/uL   Lymphocytes Relative 4 (*) 12 - 46 %   Lymphs Abs 0.4 (*) 0.7 - 4.0 K/uL   Monocytes Relative 11  3 - 12 %   Monocytes Absolute 1.2 (*) 0.1 - 1.0 K/uL   Eosinophils Relative 2  0 - 5 %   Eosinophils Absolute 0.2  0.0 - 0.7 K/uL   Basophils Relative 0  0 - 1 %   Basophils Absolute 0.0  0.0 - 0.1 K/uL  COMPREHENSIVE METABOLIC PANEL     Status: Abnormal   Collection Time    09/05/13 12:52 PM      Result Value Range   Sodium 136 (*) 137 - 147 mEq/L   Potassium 4.1  3.7 - 5.3 mEq/L   Chloride 97  96 - 112 mEq/L   CO2 26  19 - 32 mEq/L   Glucose, Bld 115 (*) 70 - 99 mg/dL   BUN 13  6 - 23 mg/dL   Creatinine, Ser 1.80 (*) 0.50 - 1.35 mg/dL   Calcium 8.6  8.4 - 10.5 mg/dL   Total Protein 5.4 (*) 6.0 - 8.3 g/dL   Albumin 2.9 (*) 3.5 - 5.2 g/dL   AST 17  0 - 37 U/L   ALT 11  0 - 53 U/L   Alkaline Phosphatase 57  39 - 117 U/L   Total Bilirubin 0.3  0.3 - 1.2 mg/dL   GFR calc non Af Amer 36 (*) >90 mL/min   GFR calc Af Amer 42 (*) >90 mL/min   Comment: (NOTE)     The eGFR has been calculated using the CKD EPI equation.     This calculation has not been validated in all clinical situations.     eGFR's persistently <90 mL/min signify possible Chronic Kidney     Disease.  CG4 I-STAT (LACTIC ACID)     Status: None   Collection Time    09/05/13  1:10 PM      Result Value Range   Lactic Acid, Venous 1.49  0.5 - 2.2 mmol/L  URINALYSIS, ROUTINE W REFLEX MICROSCOPIC     Status: Abnormal   Collection Time    09/05/13  1:35 PM      Result Value Range   Color, Urine YELLOW  YELLOW   APPearance CLEAR  CLEAR   Specific Gravity, Urine 1.008  1.005 - 1.030   pH 6.0  5.0 - 8.0   Glucose, UA NEGATIVE  NEGATIVE mg/dL   Hgb urine dipstick NEGATIVE  NEGATIVE   Bilirubin Urine NEGATIVE  NEGATIVE   Ketones, ur NEGATIVE  NEGATIVE mg/dL   Protein, ur NEGATIVE  NEGATIVE mg/dL    Urobilinogen, UA 0.2  0.0 - 1.0 mg/dL   Nitrite POSITIVE (*) NEGATIVE   Leukocytes, UA SMALL (*) NEGATIVE  URINE MICROSCOPIC-ADD ON     Status: Abnormal   Collection Time    09/05/13  1:35 PM      Result Value Range   WBC, UA 3-6  <3 WBC/hpf   Bacteria, UA MANY (*) RARE   Mr Lumbar Spine W Wo Contrast  09/05/2013   CLINICAL DATA:  Fever and chills.  Recent epidural abscess.  EXAM: MRI LUMBAR SPINE WITHOUT AND WITH CONTRAST  TECHNIQUE: Multiplanar and multiecho pulse sequences of the lumbar spine were obtained without and with intravenous contrast.  CONTRAST:  30mL MULTIHANCE GADOBENATE DIMEGLUMINE 529 MG/ML IV SOLN  COMPARISON:  Radiographs dated 08/15/2013 and  lumbar MRI dated 07/29/2013 and 06/07/2013  FINDINGS: There is a tiny residual amount of fluid adjacent to the anterior medial aspect of the right facet joint at L3-4 seen on images 23 and 24 series 5. This fluid in appearance to extend into the facet joint at the level of the previous more prominent epidural abscess.  There are connecting midline fluid collections along the incision which could represent seroma but given the patient's history, this could represent pus in the posterior soft tissues. This is best seen on images 7 and 8 of series 4.  There are postsurgical changes at the disc spaces at L4-5 and L5-S1 without evidence of osteomyelitis or discitis. There is a tiny amount of fluid in the L5-S1 disc space. I doubt that this represents infection.  There is small broad-based soft disc protrusion at L3-4 which compresses the the thecal sac relatively severely. This is best seen on image 24 of series 5.  T12-L1 and L1-2:  Normal.  L2-3: Posterior decompression. Tiny broad-based disc bulge with no neural impingement. Tiny amount of fluid adjacent to the inferior aspect of the left facet joint at L2-3 which could represent pus and probably communicates with the other fluid collections. This is seen on image 11 of series 4 and image 19 of series  5.  IMPRESSION: 1. Tiny amount of residual fluid in the right posterior lateral aspect of the epidural space at L3-4. The majority of epidural fluid seen on the prior exam has been evacuated. 2. Multiple enhancing fluid collections in the posterior paraspinal soft tissues which could represent abscesses, as described. 3. Compression of the thecal sac at L3-4 due to a broad-based disc protrusion.   Electronically Signed   By: Rozetta Nunnery M.D.   On: 09/05/2013 15:14    Review of Systems  Constitutional: Positive for fever and diaphoresis.  Eyes: Negative.   Respiratory: Negative.   Cardiovascular: Negative.   Gastrointestinal:       Incontinence  Genitourinary:       Incontinence  Musculoskeletal: Positive for back pain and joint pain.  Neurological: Positive for tremors, weakness and headaches.       Confusion  Endo/Heme/Allergies: Negative.   Psychiatric/Behavioral: The patient is nervous/anxious.     Blood pressure 143/45, pulse 77, temperature 99.7 F (37.6 C), temperature source Oral, resp. rate 19, height 6' (1.829 m), weight 90 kg (198 lb 6.6 oz), SpO2 93.00%. Physical Exam hent, nl. Neck, full movement with no stiffness, cv, nl. Lungs ,some base rales. Abdomen, some tenderness in the hypogastric area. Extremities, grade 1 edema. NEURO oriented x 2. Strength moves right leg well. Left leg with old foot drop. Tenderness at the big toe area. Sensory, normal with feeling at the level of the scrotum and perineal area. Rectal tone with very minimal tone. i did review the mri done [facility]. Question of soft tissue abscess lumbar.  Assessment/Plan Urine culture done. To get blood cultures, chest x-ray, iv fluids, continuation of his medications, ID and rehab consults and interventional radiology consult for poss aspiration of fluid collection.  Micaella Gitto M 09/05/2013, 7:58 PM

## 2013-09-05 NOTE — ED Notes (Signed)
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

## 2013-09-05 NOTE — ED Notes (Signed)
Bed: WA01 Expected date: 09/05/13 Expected time: 11:42 AM Means of arrival:  Comments: EMS -73 y/o fever

## 2013-09-05 NOTE — ED Provider Notes (Signed)
CSN: [id number]     Arrival date & time 09/05/13  1158 History   First MD Initiated Contact with Patient 09/05/13 1229     Chief Complaint  Patient presents with  . Fever     (Consider location/radiation/quality/duration/timing/severity/associated sxs/prior Treatment) HPI Patient presents with altered mental status, fever. Patient has multiple medical problems, including recent urinary retention, recent lumbar epidural abscess. Patient has inability to accurately provide history of present illness, level V caveat. The patient himself states that he has pain in his back, his neck, feels poor all over, with nausea, no vomiting. Patient rambles about inability to urinate, generalized discomfort, shakiness that seems to have begun this morning.  Past Medical History  Diagnosis Date  . Hyperlipidemia   . Small bowel problem     HAD ALOT OF SCAR TISSUE FROM PREVIOUS SURGERIES..NG WAS INSERTED ...Marland Kitchen.Marland Kitchen.NO SURGERY NEEDED...Marland Kitchen.Marland Kitchen.IN FOR 8 DAYS  . Disorder of blood     BEEN TREATED BY DERMATOLOGIST X 4 YRS..."BLOOD BLISTERS"  . Arthritis   . Gout   . Anxiety   . Hx MRSA infection     rt shoulder  . Pneumonia     "I've had it 3-4 times"  . Coronary artery disease     IN 2000   STENT PLACED IN 2012 sees Dr. Dietrich Patesothbart, saw last 2013  . HTN (hypertension)     se[place] Dr. Juanetta [place]Hawkins in [place]  . Small bowel obstruction   . Ventral hernia   . Acute renal failure 04/17/2013  . 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 Sternal surg  2000    HAS HAD 5-6 ON HIS STERNUM; "caught MRSA in it"  . Lumbar laminectomy/decompression microdiscectomy  06/13/2011    Procedure: LUMBAR LAMINECTOMY/DECOMPRESSION MICRODISCECTOMY;  Surgeon: Karn CassisErnesto M Botero;  Location: [facility];  Service: Neurosurgery;  Laterality: N/A;  Lumbar three, lumbar four-five Laminectomy  . Eye surgery      bilateral cataract  . Anterior cervical corpectomy  12/17/11  .  Incision and drainage of wound  ~ 20ll; 12/03/11    "had infection in my right"  . Shoulder open rotator cuff repair  ~ 2011    right  . Peripherally inserted central catheter insertion  2011 & 11/2011  . Cataract extraction w/ intraocular lens  implant, bilateral  ? 2011  . Coronary artery bypass graft  2000    CABG X5  . Back surgery      lumbar  . Tonsillectomy  1949  . Cholecystectomy  2006 "or after"  . Coronary angioplasty with stent placement  2012  . Anterior cervical corpectomy  12/17/2011    Procedure: ANTERIOR CERVICAL CORPECTOMY;  Surgeon: Karn CassisErnesto M Botero, MD;  Location: [facility];  Service: Neurosurgery;  Laterality: N/A;  Anterior Cervical Decompression Fusion Five to Thoracic Two with plating  . Lumbar laminectomy/decompression microdiscectomy Right 06/17/2013    Procedure: LUMBAR LAMINECTOMY/DECOMPRESSION MICRODISCECTOMY 1 LEVEL;  Surgeon: Karn CassisErnesto M Botero, MD;  Location: [facility];  Service: Neurosurgery;  Laterality: Right;  Right L3-4 Microdiskectomy  . Lumbar wound debridement N/A 08/02/2013    Procedure: INCISION AND DRAINAGE OF LUMBAR WOUND DEBRIDEMENT;  Surgeon: Karn CassisErnesto M Botero, MD;  Location: [facility];  Service: Neurosurgery;  Laterality: N/A;   Family History  Problem Relation Age of Onset  . Heart disease    . Hypertension Mother   . Hypertension Maternal Aunt   . Hypertension Maternal Uncle   .  Anesthesia problems Neg Hx   . Hypotension Neg Hx   . Malignant hyperthermia Neg Hx   . Pseudochol deficiency Neg Hx    History  Substance Use Topics  . Smoking status: Former Smoker -- 1.00 packs/day for 1 years    Types: Cigarettes    Quit date: 12/27/1998  . Smokeless tobacco: Never Used  . Alcohol Use: No    Review of Systems  Unable to perform ROS: Mental status change      Allergies  Morphine; Metoprolol; and Rifampin  Home Medications   Current Outpatient Rx  Name  Route  Sig  Dispense  Refill  . acetaminophen (TYLENOL) 325 MG  tablet   Oral   Take 1-2 tablets (325-650 mg total) by mouth every 4 (four) hours as needed for mild pain.         Marland Kitchen atorvastatin (LIPITOR) 40 MG tablet   Oral   Take 1 tablet (40 mg total) by mouth daily.   90 tablet   2   . camphor-menthol (SARNA) lotion   Topical   Apply topically 3 (three) times daily after meals.   222 mL   1   . citalopram (CELEXA) 40 MG tablet   Oral   Take 40 mg by mouth daily.         . colchicine 0.6 MG tablet   Oral   Take 1 tablet (0.6 mg total) by mouth 2 (two) times daily.   60 tablet   1   . Cyanocobalamin (VITAMIN B 12 PO)   Oral   Take 1 tablet by mouth daily.         Marland Kitchen doxazosin (CARDURA) 4 MG tablet   Oral   Take 8 mg by mouth at bedtime.         . gabapentin (NEURONTIN) 300 MG capsule   Oral   Take 2 capsules (600 mg total) by mouth 3 (three) times daily.   180 capsule   1   . methocarbamol (ROBAXIN) 500 MG tablet   Oral   Take 1 tablet (500 mg total) by mouth 4 (four) times daily as needed for muscle spasms.   75 tablet   1   . nitroGLYCERIN (NITROSTAT) 0.4 MG SL tablet   Sublingual   Place 0.4 mg under the tongue every 5 (five) minutes as needed. For chest pain         . OxyCODONE (OXYCONTIN) 20 mg T12A 12 hr tablet   Oral   Take 1 tablet (20 mg total) by mouth every 12 (twelve) hours.   60 tablet   0   . predniSONE (DELTASONE) 5 MG tablet   Oral   Take 5 mg by mouth daily with breakfast.         . sulfamethoxazole-trimethoprim (BACTRIM DS) 800-160 MG per tablet   Oral   Take 1 tablet by mouth every 12 (twelve) hours.   60 tablet   1   . tamsulosin (FLOMAX) 0.4 MG CAPS capsule   Oral   Take 1 capsule (0.4 mg total) by mouth daily after breakfast.   30 capsule   1   . vitamin C (ASCORBIC ACID) 500 MG tablet   Oral   Take 500 mg by mouth daily.         Marland Kitchen VITAMIN D, CHOLECALCIFEROL, PO   Oral   Take 2-3 tablets by mouth daily.          BP 99/45  Pulse 70  Temp(Src) 101.4 F (38.6  C)  (Rectal)  Resp 14  SpO2 94% Physical Exam  Nursing note and vitals reviewed. Constitutional: He appears well-developed. He appears distressed.  HENT:  Head: Normocephalic and atraumatic.  Eyes: Conjunctivae and EOM are normal.  Neck:  The patient moves his neck freely in all dimensions, though he describes tenderness throughout the posterior neck.  Cardiovascular: Regular rhythm.  Tachycardia present.   Pulmonary/Chest: Effort normal. No stridor. No respiratory distress.  Abdominal: He exhibits no distension.  Musculoskeletal: He exhibits no edema.  Neurological: He is alert.  Patient is awake, alert, but disoriented to all but self, moves all extremities freely, but does not consistently begin with exam.  Patient was cerebellar deficits. Speech is clear, though repetitive, perseverant  Skin: Skin is warm. He is diaphoretic.  Psychiatric: His mood appears anxious. His affect is inappropriate. His speech is tangential. Cognition and memory are impaired.    ED Course  Procedures (including critical care time) Labs Review Labs Reviewed  CBC WITH DIFFERENTIAL - Abnormal; Notable for the following:    WBC 11.6 (*)    RBC 2.93 (*)    Hemoglobin 8.7 (*)    HCT 25.9 (*)    RDW 16.1 (*)    Platelets 141 (*)    Neutrophils Relative % 84 (*)    Neutro Abs 9.8 (*)    Lymphocytes Relative 4 (*)    Lymphs Abs 0.4 (*)    Monocytes Absolute 1.2 (*)    All other components within normal limits  COMPREHENSIVE METABOLIC PANEL - Abnormal; Notable for the following:    Sodium 136 (*)    Glucose, Bld 115 (*)    Creatinine, Ser 1.80 (*)    Total Protein 5.4 (*)    Albumin 2.9 (*)    GFR calc non Af Amer 36 (*)    GFR calc Af Amer 42 (*)    All other components within normal limits  URINALYSIS, ROUTINE W REFLEX MICROSCOPIC - Abnormal; Notable for the following:    Nitrite POSITIVE (*)    Leukocytes, UA SMALL (*)    All other components within normal limits  URINE MICROSCOPIC-ADD ON -  Abnormal; Notable for the following:    Bacteria, UA MANY (*)    All other components within normal limits  URINE CULTURE  CG4 I-STAT (LACTIC ACID)   Imaging Review Mr Lumbar Spine W Wo Contrast  09/05/2013   CLINICAL DATA:  Fever and chills.  Recent epidural abscess.  EXAM: MRI LUMBAR SPINE WITHOUT AND WITH CONTRAST  TECHNIQUE: Multiplanar and multiecho pulse sequences of the lumbar spine were obtained without and with intravenous contrast.  CONTRAST:  9mL MULTIHANCE GADOBENATE DIMEGLUMINE 529 MG/ML IV SOLN  COMPARISON:  Radiographs dated 08/15/2013 and lumbar MRI dated 07/29/2013 and 06/07/2013  FINDINGS: There is a tiny residual amount of fluid adjacent to the anterior medial aspect of the right facet joint at L3-4 seen on images 23 and 24 series 5. This fluid in appearance to extend into the facet joint at the level of the previous more prominent epidural abscess.  There are connecting midline fluid collections along the incision which could represent seroma but given the patient's history, this could represent pus in the posterior soft tissues. This is best seen on images 7 and 8 of series 4.  There are postsurgical changes at the disc spaces at L4-5 and L5-S1 without evidence of osteomyelitis or discitis. There is a tiny amount of fluid in the L5-S1 disc space. I doubt that this represents  infection.  There is small broad-based soft disc protrusion at L3-4 which compresses the the thecal sac relatively severely. This is best seen on image 24 of series 5.  T12-L1 and L1-2:  Normal.  L2-3: Posterior decompression. Tiny broad-based disc bulge with no neural impingement. Tiny amount of fluid adjacent to the inferior aspect of the left facet joint at L2-3 which could represent pus and probably communicates with the other fluid collections. This is seen on image 11 of series 4 and image 19 of series 5.  IMPRESSION: 1. Tiny amount of residual fluid in the right posterior lateral aspect of the epidural space at  L3-4. The majority of epidural fluid seen on the prior exam has been evacuated. 2. Multiple enhancing fluid collections in the posterior paraspinal soft tissues which could represent abscesses, as described. 3. Compression of the thecal sac at L3-4 due to a broad-based disc protrusion.   Electronically Signed   By: Geanie Cooley M.D.   On: 09/05/2013 15:14    EKG Interpretation    Date/Time:  Monday September 05 2013 12:21:41 EST Ventricular Rate:  75 PR Interval:  142 QRS Duration: 88 QT Interval:  376 QTC Calculation: 420 R Axis:   61 Text Interpretation:  Sinus rhythm RSR' in V1 or V2, probably normal variant Sinus rhythm RSR' or QR pattern in V1 suggests right ventricular conduction delay Left ventricular hypertrophy Abnormal ekg Confirmed by Gerhard Munch  MD [phone]) on 09/05/2013 1:22:49 PM          I reviewed the EMR  Update: On repeat exam the patient has persistent altered mental status. Patient's wife is now present, confirmed that the changes have occurred over the past day.  Update: I discussed the patient's MRI results, presentation with his neurosurgeon.  With the altered mental status, hypotension, fever, patient will be transferred to neurosurgical ICU for further evaluation and management.  MDM   This patient with multiple medical problems, including recent spinal repair completed by cauda equina, epidural abscess presents with altered mental status, fever, hypotension.  Patient's evaluation here demonstrates presence of fluid collections in the lumbar spine, as well as evidence of urinary tract infection.  With altered mental status, hypotension, fever, concern for infection, patient received IV antibiotics empirically, required admission to the ICU for further evaluation and management.  CRITICAL CARE Performed by: Gerhard Munch Total critical care time: 40 Critical care time was exclusive of separately billable procedures and treating other patients. Critical  care was necessary to treat or prevent imminent or life-threatening deterioration. Critical care was time spent personally by me on the following activities: development of treatment plan with patient and/or surrogate as well as nursing, discussions with consultants, evaluation of patient's response to treatment, examination of patient, obtaining history from patient or surrogate, ordering and performing treatments and interventions, ordering and review of laboratory studies, ordering and review of radiographic studies, pulse oximetry and re-evaluation of patient's condition.     Gerhard Munch, MD 09/05/13 [phone]

## 2013-09-05 NOTE — Progress Notes (Deleted)
Peripherally Inserted Central Catheter/Midline Placement  The IV Nurse has discussed with the patient and/or persons authorized to consent for the patient, the purpose of this procedure and the potential benefits and risks involved with this procedure.  The benefits include less needle sticks, lab draws from the catheter and patient may be discharged home with the catheter.  Risks include, but not limited to, infection, bleeding, blood clot (thrombus formation), and puncture of an artery; nerve damage and irregular heat beat.  Alternatives to this procedure were also discussed.  PICC/Midline Placement Documentation        Poff, Deatra RobinsonJessica Ann 09/05/2013, 6:42 PM

## 2013-09-06 ENCOUNTER — Ambulatory Visit (HOSPITAL_COMMUNITY): Payer: Medicare Other | Admitting: Physical Therapy

## 2013-09-06 DIAGNOSIS — R509 Fever, unspecified: Secondary | ICD-10-CM | POA: Diagnosis present

## 2013-09-06 DIAGNOSIS — A498 Other bacterial infections of unspecified site: Secondary | ICD-10-CM

## 2013-09-06 LAB — MRSA PCR SCREENING: MRSA by PCR: NEGATIVE

## 2013-09-06 MED ORDER — CEFEPIME HCL 1 G IJ SOLR
1.0000 g | Freq: Two times a day (BID) | INTRAMUSCULAR | Status: AC
  Administered 2013-09-06 – 2013-09-19 (×26): 1 g via INTRAVENOUS
  Filled 2013-09-06 (×27): qty 1

## 2013-09-06 NOTE — Consult Note (Signed)
[facility]    Date of Admission:  09/05/2013    Total days of antibiotics 39        Day 18 trimethoprim sulfamethoxazole               Reason for Consult: Fever and chills    Referring Physician: Dr. Hilda Lias Primary Care Physician: Dr. Shaune Pollack  Principal Problem:   Fever and chills Active Problems:   Lumbar degenerative disc disease   Cauda equina syndrome with neurogenic bladder   Altered mental status   . atorvastatin  40 mg Oral Daily  . citalopram  40 mg Oral Daily  . colchicine  0.6 mg Oral BID  . dexamethasone  4 mg Intravenous Q6H  . doxazosin  8 mg Oral QHS  . gabapentin  600 mg Oral TID  . heparin subcutaneous  5,000 Units Subcutaneous Q8H  . OxyCODONE  20 mg Oral Q12H  . sulfamethoxazole-trimethoprim  1 tablet Oral Q12H  . tamsulosin  0.4 mg Oral QPC breakfast  . vitamin C  500 mg Oral Daily    Recommendations: 1. Change trimethoprim-sulfamethoxazole desipramine pending final urine and blood cultures   Assessment: I suspect that his fever and chills may be due to a urinary tract infection. He grew Pseudomonas from a urine culture on February 6 that was resistant to oral agents and he is now growing Escherichia coli with sensitivities pending. I will change him to IV cefepime pending final urine culture results.    HPI: [name] is a 73 y.o. male with spinal stenosis and degenerative disc disease who underwent lumbar surgery in November. It was complicated by cauda equina syndrome and CSF leak. He was readmitted to the hospital in early January with severe, increased back pain. MRI revealed fluid in the operative bed. He underwent incision and drainage. Operative Gram stain and cultures were negative but he was on antibiotics prior to surgery. My partner, Dr. Jerolyn Center, initially recommended 6 weeks of IV vancomycin, cefepime and rifampin. However he did not feel he could afford the cost of outpatient IV  antibiotic therapy. This combined with the fact that there was no definitive evidence of infection led her to switch him to oral trimethoprim sulfamethoxazole and rifampin on January 23. He was feeling much better with decreased back pain on discharge. He had trouble tolerating rifampin and it was stopped on February 3 but he continued trimethoprim-sulfamethoxazole.  He has had bladder outlet obstruction and has been requiring self-catheterization at home. He recently developed some suprapubic discomfort and yesterday developed fever and chills leading to readmission. He has had no increase in back pain and no wound drainage. He feels better today.   Review of Systems: Pertinent items are noted in HPI.  Past Medical History  Diagnosis Date  . Hyperlipidemia   . Small bowel problem     HAD ALOT OF SCAR TISSUE FROM PREVIOUS SURGERIES..NG WAS INSERTED ...Marland KitchenMarland KitchenNO SURGERY NEEDED...Marland KitchenMarland KitchenIN FOR 8 DAYS  . Disorder of blood     BEEN TREATED BY DERMATOLOGIST X 4 YRS..."BLOOD BLISTERS"  . Arthritis   . Gout   . Anxiety   . Hx MRSA infection     rt shoulder  . Pneumonia     "I've had it 3-4 times"  . Coronary artery disease     IN 2000   STENT PLACED IN 2012 sees Dr. Dietrich Pates, saw last 2013  . HTN (hypertension)  se[place] Dr. Juanetta [place]lingHawkins in [place]  . Small bowel obstruction   . Ventral hernia   . Acute renal failure 04/17/2013  . 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 Smoking status: Former Smoker -- 1.00 packs/day for 1 years    Types: Cigarettes    Quit date: 12/27/1998  . Smokeless tobacco: Never Used  . Alcohol Use: No    Family History  Problem Relation Age of Onset  . Heart disease    . Hypertension Mother   . Hypertension Maternal Aunt   . Hypertension Maternal Uncle   . Anesthesia problems Neg Hx   . Hypotension Neg Hx   . Malignant hyperthermia Neg Hx   . Pseudochol deficiency Neg Hx    Allergies  Allergen Reactions  .  Morphine Other (See Comments)    REACTION: made him go crazy; "I had fun w/it; my family didn't think it was too funny"  . Metoprolol Other (See Comments)    REACTION:  Tachycardia; "don't remember how bad it was; it was so long ago"  . Rifampin     Itch     OBJECTIVE: Blood pressure 141/64, pulse 65, temperature 98.1 F (36.7 C), temperature source Oral, resp. rate 15, height 6' (1.829 m), weight 90 kg (198 lb 6.6 oz), SpO2 92.00%. General: He is sitting up in a chair joking with his wife Skin: No rash Lungs: Clear Cor: Regular S1 and S2 with no murmurs Abdomen: Nontender. No CVA or suprapubic tenderness Lumbar incision healing without evidence of infection  Lab Results Lab Results  Component Value Date   WBC 11.6* 09/05/2013   HGB 8.7* 09/05/2013   HCT 25.9* 09/05/2013   MCV 88.4 09/05/2013   PLT 141* 09/05/2013    Lab Results  Component Value Date   CREATININE 1.80* 09/05/2013   BUN 13 09/05/2013   NA 136* 09/05/2013   K 4.1 09/05/2013   CL 97 09/05/2013   CO2 26 09/05/2013    Lab Results  Component Value Date   ALT 11 09/05/2013   AST 17 09/05/2013   ALKPHOS 57 09/05/2013   BILITOT 0.3 09/05/2013     Microbiology: Recent Results (from the past 240 hour(s))  URINE CULTURE     Status: None   Collection Time    09/02/13  9:51 AM      Result Value Range Status   Specimen Description URINE, CATHETERIZED   Final   Special Requests NONE   Final   Culture  Setup Time     Final   Value: 09/03/2013 00:31     Performed at [facility]   Colony Count     Final   Value: >=100,000 COLONIES/ML     Performed at [facility]   Culture     Final   Value: PSEUDOMONAS AERUGINOSA     Performed at [facility]   Report Status 09/04/2013 FINAL   Final   Organism ID, Bacteria PSEUDOMONAS AERUGINOSA   Final  URINE CULTURE     Status: None   Collection Time    09/05/13  1:35 PM      Result Value Range Status   Specimen Description URINE, CATHETERIZED   Final   Special  Requests NONE   Final   Culture  Setup Time     Final   Value: 09/05/2013 21:02     Performed at [facility]   Colony Count  Final   Value: >=100,000 COLONIES/ML     Performed at [facility]   Culture     Final   Value: ESCHERICHIA COLI     Performed at [facility]   Report Status PENDING   Incomplete  MRSA PCR SCREENING     Status: None   Collection Time    09/05/13 11:50 PM      Result Value Range Status   MRSA by PCR NEGATIVE  NEGATIVE Final   Comment:            The GeneXpert MRSA Assay (FDA     approved for NASAL specimens     only), is one component of a     comprehensive MRSA colonization     surveillance program. It is not     intended to diagnose MRSA     infection nor to guide or     monitor treatment for     MRSA infections.    Cliffton Asters, MD [facility] [phone] pager   [phone] cell 09/06/2013, 3:37 PM

## 2013-09-06 NOTE — Progress Notes (Signed)
[facility]  Patient Status: Active (receiving services up to time of hospitalization)  [facility] is providing the following services: RN, PT and OT  If patient discharges after hours, please call [phone].   Jodene NamMary Hickling 09/06/2013, 2:37 PM

## 2013-09-06 NOTE — Progress Notes (Signed)
In and out cath done per order and 250 ml output noted. Tolerated well.

## 2013-09-06 NOTE — Progress Notes (Signed)
Patient ID: [name], male   DOB: 06/24/1941, 73 y.o.   MRN: [id number] Stable. Spoke with wife late last night. She told me he was unable to move both legs and had increase of temp. Now awafe. Neuro unchanged, had a bm on his own. ID to see and advise. Pt to see.

## 2013-09-06 NOTE — Plan of Care (Signed)
Problem: Phase III Progression Outcomes Goal: Voiding independently Outcome: Completed/Met Date Met:  09/06/13 At baseline

## 2013-09-06 NOTE — Progress Notes (Signed)
Pt awakens from sleep, complains of "gout pain". Stating " I'm going to need another pill or you need to call the MD because this isn't working". Dr. Jule SerNudleman notified of pt's pain, request, and nursing interventions. Informed to contact Pharmacy to ask about dosing of colchicine and if it could be changed.  Fayrene FearingJames, Pharmacist, inquired about medicine and informed that per pt dosing that twice a day is standard and can not be given more frequently. Talked about other medications in relation to gout. Will inform pt.

## 2013-09-06 NOTE — Progress Notes (Signed)
UR completed.  Raksha Wolfgang, RN BSN [facility] CCM Trauma/Neuro ICU Case Manager [phone]  

## 2013-09-06 NOTE — Evaluation (Signed)
Physical Therapy Evaluation Patient Details Name: [name] MRN: [id number] DOB: 08/26/1940 Today's Date: 09/06/2013 Time: 1150-1230 PT Time Calculation (min): 40 min  PT Assessment / Plan / Recommendation History of Present Illness  patient who this am started to complain of shakeness, diaphoresis, decrease of loc, increase ot temperature. [facility]  Where he had a mri of the lumbar spine. Started on abs and we [facility]. He does not know why is here, has some mild headache and some pain in the left foot(hx of gout  Clinical Impression  Pt functioning near PLOF. Pt with noted SOB during activity however SpO2 at >94% on RA. Pt uses w/c as primary mode of mobility and only amb with HHPT. Pt safe to d/c home once medically stable with con't HHPT services. Pt with supportive wife and excellent handicapped set up home.     PT Assessment  Patient needs continued PT services    Follow Up Recommendations  Home health PT;Supervision/Assistance - 24 hour (con't of HH services)    Does the patient have the potential to tolerate intense rehabilitation      Barriers to Discharge        Equipment Recommendations  None recommended by PT    Recommendations for Other Services     Frequency Min 3X/week    Precautions / Restrictions Precautions Precautions: Back;Fall Precaution Booklet Issued:  (surgery completed in November ) Restrictions Weight Bearing Restrictions: No   Pertinent Vitals/Pain Denies pain, c/o SOB with activity      Mobility  Bed Mobility Overal bed mobility: Modified Independent General bed mobility comments: increased time, using momentum to propel self forward Transfers Overall transfer level: Needs assistance Transfers: Sit to/from Stand Sit to Stand: Min assist;+2 physical assistance;+2 safety/equipment General transfer comment: pt uses rocking forward/momentum to bring self forward, assist to steady pt and  provide LE stability Ambulation/Gait Ambulation/Gait assistance: Mod assist (2nd person for line mangement and chair follow) Ambulation Distance (Feet): 10 Feet Assistive device: Rolling walker (2 wheeled) Gait Pattern/deviations: Step-to pattern Gait velocity: slow General Gait Details: L foot ace wrapped to assist with drop foot, pt with ataxic gait pattern, narrow base of support, pt shaky. pt had BM during ambulation requiring pt to sit on bed pan    Exercises     PT Diagnosis: Difficulty walking;Generalized weakness  PT Problem List: Decreased strength;Decreased activity tolerance;Decreased balance;Decreased mobility PT Treatment Interventions: Gait training;DME instruction;Stair training;Functional mobility training;Therapeutic activities;Therapeutic exercise;Balance training;Neuromuscular re-education     PT Goals(Current goals can be found in the care plan section) Acute Rehab PT Goals Patient Stated Goal: home PT Goal Formulation: With patient/family Time For Goal Achievement: 09/20/13 Potential to Achieve Goals: Good  Visit Information  Last PT Received On: 09/06/13 Assistance Needed: +2 History of Present Illness: patient who this am started to complain of shakeness, diaphoresis, decrease of loc, increase ot temperature. [facility]  Where he had a mri of the lumbar spine. Started on abs and we [facility]. He does not know why is here, has some mild headache and some pain in the left foot(hx of gout       Prior Functioning  Home Living Family/patient expects to be discharged to:: Private residence Living Arrangements: Spouse/significant other Available Help at Discharge: Family;Available 24 hours/day Type of Home: House Home Access: Ramped entrance Home Layout: One level Home Equipment: Wheelchair - [employer]manual;Walker - 2 wheels;Bedside  commode;Tub bench;Grab bars - tub/shower;Hand held shower head Prior Function Level of  Independence: Independent Comments: with increased time able to dress and bath with adaptive equip. pt was working with PT/OT at home. pt indep from w/c Communication Communication: No difficulties Dominant Hand: Left    Cognition  Cognition Arousal/Alertness: Awake/alert Behavior During Therapy: WFL for tasks assessed/performed Overall Cognitive Status: Within Functional Limits for tasks assessed    Extremity/Trunk Assessment Upper Extremity Assessment Upper Extremity Assessment: Generalized weakness Lower Extremity Assessment Lower Extremity Assessment: RLE deficits/detail;LLE deficits/detail RLE Deficits / Details: generalized weakness LLE Deficits / Details: drop foot, grossly 3-/5 LLE Sensation: decreased light touch Cervical / Trunk Assessment Cervical / Trunk Assessment: Normal   Balance Balance Overall balance assessment: Needs assistance Sitting-balance support: Feet supported Sitting balance-Leahy Scale: Fair Standing balance support: Bilateral upper extremity supported Standing balance-Leahy Scale: Poor Standing balance comment: requires use of RW  General Comments General comments (skin integrity, edema, etc.): pt stood x 1 min for hygiene s/p BM, pt relies heavily on bilat UEs  End of Session PT - End of Session Equipment Utilized During Treatment: Gait belt  GP     Cornie Mccomber, Becky Sax 09/06/2013, 3:50 PM   Lewis Shock, PT, DPT Pager #: [phone] Office #: [phone]

## 2013-09-06 NOTE — Progress Notes (Signed)
Pt arrived on unit via wheelchair at 1823. Pt A&O x4, vitals taken, oriented to room and call light within reach. Will continue to monitor quietly.

## 2013-09-06 NOTE — Progress Notes (Signed)
Patient ID: [name], male   DOB: 09/09/1940, 73 y.o.   MRN: [id number] Stable, seen by dr [name]

## 2013-09-07 ENCOUNTER — Inpatient Hospital Stay (HOSPITAL_COMMUNITY): Payer: Medicare Other

## 2013-09-07 DIAGNOSIS — R778 Other specified abnormalities of plasma proteins: Secondary | ICD-10-CM | POA: Diagnosis present

## 2013-09-07 DIAGNOSIS — R0600 Dyspnea, unspecified: Secondary | ICD-10-CM | POA: Diagnosis present

## 2013-09-07 DIAGNOSIS — B965 Pseudomonas (aeruginosa) (mallei) (pseudomallei) as the cause of diseases classified elsewhere: Secondary | ICD-10-CM

## 2013-09-07 DIAGNOSIS — R7989 Other specified abnormal findings of blood chemistry: Secondary | ICD-10-CM

## 2013-09-07 DIAGNOSIS — I509 Heart failure, unspecified: Secondary | ICD-10-CM | POA: Diagnosis present

## 2013-09-07 DIAGNOSIS — N390 Urinary tract infection, site not specified: Secondary | ICD-10-CM | POA: Diagnosis present

## 2013-09-07 LAB — CBC WITH DIFFERENTIAL/PLATELET
BASOS PCT: 0 % (ref 0–1)
Basophils Absolute: 0 10*3/uL (ref 0.0–0.1)
EOS ABS: 0 10*3/uL (ref 0.0–0.7)
Eosinophils Relative: 0 % (ref 0–5)
HCT: 24.9 % — ABNORMAL LOW (ref 39.0–52.0)
Hemoglobin: 8.4 g/dL — ABNORMAL LOW (ref 13.0–17.0)
Lymphocytes Relative: 3 % — ABNORMAL LOW (ref 12–46)
Lymphs Abs: 0.3 10*3/uL — ABNORMAL LOW (ref 0.7–4.0)
MCH: 29.6 pg (ref 26.0–34.0)
MCHC: 33.7 g/dL (ref 30.0–36.0)
MCV: 87.7 fL (ref 78.0–100.0)
Monocytes Absolute: 0.6 10*3/uL (ref 0.1–1.0)
Monocytes Relative: 7 % (ref 3–12)
NEUTROS ABS: 8.1 10*3/uL — AB (ref 1.7–7.7)
NEUTROS PCT: 90 % — AB (ref 43–77)
Platelets: 129 10*3/uL — ABNORMAL LOW (ref 150–400)
RBC: 2.84 MIL/uL — ABNORMAL LOW (ref 4.22–5.81)
RDW: 15.7 % — ABNORMAL HIGH (ref 11.5–15.5)
WBC: 9 10*3/uL (ref 4.0–10.5)

## 2013-09-07 LAB — URINE CULTURE: Colony Count: >=100000

## 2013-09-07 LAB — BASIC METABOLIC PANEL
BUN: 26 mg/dL — ABNORMAL HIGH (ref 6–23)
CO2: 23 mEq/L (ref 19–32)
Calcium: 8.8 mg/dL (ref 8.4–10.5)
Chloride: 100 mEq/L (ref 96–112)
Creatinine, Ser: 1.34 mg/dL (ref 0.50–1.35)
GFR calc Af Amer: 60 mL/min — ABNORMAL LOW (ref >90)
GFR, EST NON AFRICAN AMERICAN: 52 mL/min — AB (ref >90)
Glucose, Bld: 136 mg/dL — ABNORMAL HIGH (ref 70–99)
POTASSIUM: 4.5 meq/L (ref 3.7–5.3)
SODIUM: 136 meq/L — AB (ref 137–147)

## 2013-09-07 LAB — TSH: TSH: 2.362 u[IU]/mL (ref 0.350–4.500)

## 2013-09-07 LAB — TROPONIN I
TROPONIN I: 0.56 ng/mL — AB (ref <0.30)
TROPONIN I: 0.55 ng/mL — AB (ref <0.30)

## 2013-09-07 LAB — PRO B NATRIURETIC PEPTIDE: Pro B Natriuretic peptide (BNP): 23778 pg/mL — ABNORMAL HIGH (ref 0–125)

## 2013-09-07 MED ORDER — FUROSEMIDE 10 MG/ML IJ SOLN
40.0000 mg | Freq: Two times a day (BID) | INTRAMUSCULAR | Status: DC
  Administered 2013-09-08 – 2013-09-10 (×5): 40 mg via INTRAVENOUS
  Filled 2013-09-07 (×7): qty 4

## 2013-09-07 MED ORDER — ASPIRIN 81 MG PO CHEW
324.0000 mg | CHEWABLE_TABLET | Freq: Once | ORAL | Status: AC
  Administered 2013-09-07: 324 mg via ORAL
  Filled 2013-09-07: qty 4

## 2013-09-07 MED ORDER — CARVEDILOL 6.25 MG PO TABS
6.2500 mg | ORAL_TABLET | Freq: Two times a day (BID) | ORAL | Status: DC
  Administered 2013-09-07 – 2013-09-20 (×23): 6.25 mg via ORAL
  Filled 2013-09-07 (×28): qty 1

## 2013-09-07 MED ORDER — ENOXAPARIN SODIUM 100 MG/ML ~~LOC~~ SOLN
90.0000 mg | Freq: Two times a day (BID) | SUBCUTANEOUS | Status: DC
  Administered 2013-09-07 – 2013-09-12 (×10): 90 mg via SUBCUTANEOUS
  Filled 2013-09-07 (×12): qty 1

## 2013-09-07 MED ORDER — LORAZEPAM 1 MG PO TABS
1.0000 mg | ORAL_TABLET | Freq: Once | ORAL | Status: AC
  Administered 2013-09-07: 1 mg via ORAL
  Filled 2013-09-07: qty 1

## 2013-09-07 MED ORDER — ASPIRIN EC 81 MG PO TBEC
81.0000 mg | DELAYED_RELEASE_TABLET | Freq: Every day | ORAL | Status: DC
  Administered 2013-09-08 – 2013-09-20 (×12): 81 mg via ORAL
  Filled 2013-09-07 (×13): qty 1

## 2013-09-07 MED ORDER — FUROSEMIDE 10 MG/ML IJ SOLN
40.0000 mg | Freq: Once | INTRAMUSCULAR | Status: AC
  Administered 2013-09-07: 40 mg via INTRAVENOUS
  Filled 2013-09-07: qty 4

## 2013-09-07 NOTE — Progress Notes (Signed)
Patient ID: [name], male   DOB: 08/16/1940, 73 y.o.   MRN: [id number]         [facility]    Date of Admission:  09/05/2013    Total days of antibiotics 40        Day 2 cefepime         Principal Problem:   Fever and chills Active Problems:   Lumbar degenerative disc disease   Cauda equina syndrome with neurogenic bladder   HYPERTENSION   CAD in native artery   Altered mental status   Dyspnea   UTI (urinary tract infection)   . atorvastatin  40 mg Oral Daily  . ceFEPime (MAXIPIME) IV  1 g Intravenous Q12H  . citalopram  40 mg Oral Daily  . colchicine  0.6 mg Oral BID  . doxazosin  8 mg Oral QHS  . furosemide  40 mg Intravenous Once  . gabapentin  600 mg Oral TID  . heparin subcutaneous  5,000 Units Subcutaneous 3 times per day  . LORazepam  1 mg Oral Once  . OxyCODONE  20 mg Oral Q12H  . tamsulosin  0.4 mg Oral QPC breakfast  . vitamin C  500 mg Oral Daily    Subjective: He is feeling better. He is not having any back pain.  Past Medical History  Diagnosis Date  . Hyperlipidemia   . Small bowel problem     HAD ALOT OF SCAR TISSUE FROM PREVIOUS SURGERIES..NG WAS INSERTED ...Marland KitchenMarland KitchenNO SURGERY NEEDED...Marland KitchenMarland KitchenIN FOR 8 DAYS  . Disorder of blood     BEEN TREATED BY DERMATOLOGIST X 4 YRS..."BLOOD BLISTERS"  . Arthritis   . Gout   . Anxiety   . Hx MRSA infection     rt shoulder  . Pneumonia     "I've had it 3-4 times"  . Coronary artery disease     IN 2000   STENT PLACED IN 2012 sees Dr. Dietrich Pates, saw last 2013  . HTN (hypertension)     sees [name] in [place]  . Small bowel obstruction   . Ventral hernia   . Acute renal failure 04/17/2013  . 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 Smoking status: Former Smoker -- 1.00 packs/day for 1 years    Types: Cigarettes    Quit date: 12/27/1998  . Smokeless tobacco: Never Used  . Alcohol Use: No    Family History  Problem Relation  Age of Onset  . Heart disease    . Hypertension Mother   . Hypertension Maternal Aunt   . Hypertension Maternal Uncle   . Anesthesia problems Neg Hx   . Hypotension Neg Hx   . Malignant hyperthermia Neg Hx   . Pseudochol deficiency Neg Hx     Allergies  Allergen Reactions  . Morphine Other (See Comments)    REACTION: made him go crazy; "I had fun w/it; my family didn't think it was too funny"  . Metoprolol Other (See Comments)    REACTION:  Tachycardia; "don't remember how bad it was; it was so long ago"  . Rifampin     Itch     Objective: Temp:  [97 F (36.1 C)-98.7 F (37.1 C)] 98.3 F (36.8 C) (02/11 1000) Pulse Rate:  [65-78] 65 (02/11 1000) Resp:  [15-18] 18 (02/11 1000) BP: (129-169)/(60-79) 164/74 mmHg (02/11 1000) SpO2:  [90 %-96 %] 92 % (02/11 1000)  General: Alert and comfortable Skin: No rash Lungs: Clear Cor: Regular S1-S2 no murmurs Abdomen: Nontender. No suprapubic or CVA tenderness Lumbar incision healing well without evidence of infection  Lab Results Lab Results  Component Value Date   WBC 9.0 09/07/2013   HGB 8.4* 09/07/2013   HCT 24.9* 09/07/2013   MCV 87.7 09/07/2013   PLT 129* 09/07/2013    Lab Results  Component Value Date   CREATININE 1.80* 09/05/2013   BUN 13 09/05/2013   NA 136* 09/05/2013   K 4.1 09/05/2013   CL 97 09/05/2013   CO2 26 09/05/2013    Lab Results  Component Value Date   ALT 11 09/05/2013   AST 17 09/05/2013   ALKPHOS 57 09/05/2013   BILITOT 0.3 09/05/2013      Microbiology: Recent Results (from the past 240 hour(s))  URINE CULTURE     Status: None   Collection Time    09/02/13  9:51 AM      Result Value Ref Range Status   Specimen Description URINE, CATHETERIZED   Final   Special Requests NONE   Final   Culture  Setup Time     Final   Value: 09/03/2013 00:31     Performed at [facility] Count     Final   Value: >=100,000 COLONIES/ML     Performed at [facility]   Culture     Final   Value:  PSEUDOMONAS AERUGINOSA     Performed at [facility]   Report Status 09/04/2013 FINAL   Final   Organism ID, Bacteria PSEUDOMONAS AERUGINOSA   Final  URINE CULTURE     Status: None   Collection Time    09/05/13  1:35 PM      Result Value Ref Range Status   Specimen Description URINE, CATHETERIZED   Final   Special Requests NONE   Final   Culture  Setup Time     Final   Value: 09/05/2013 21:02     Performed at [facility] Count     Final   Value: >=100,000 COLONIES/ML     Performed at [facility]   Culture     Final   Value: ESCHERICHIA COLI     Performed at [facility]   Report Status 09/07/2013 FINAL   Final   Organism ID, Bacteria ESCHERICHIA COLI   Final  CULTURE, BLOOD (ROUTINE X 2)     Status: None   Collection Time    09/05/13 11:15 PM      Result Value Ref Range Status   Specimen Description BLOOD RIGHT ANTECUBITAL   Final   Special Requests BOTTLES DRAWN AEROBIC ONLY 10CC   Final   Culture  Setup Time     Final   Value: 09/06/2013 03:28     Performed at [facility]   Culture     Final   Value:        BLOOD CULTURE RECEIVED NO GROWTH TO DATE CULTURE WILL BE HELD FOR 5 DAYS BEFORE ISSUING A FINAL NEGATIVE REPORT     Performed at [facility]   Report Status PENDING   Incomplete  CULTURE, BLOOD (ROUTINE X 2)     Status: None   Collection Time    09/05/13 11:20 PM      Result Value Ref Range Status   Specimen Description BLOOD RIGHT HAND   Final   Special Requests BOTTLES DRAWN AEROBIC  ONLY 10CC   Final   Culture  Setup Time     Final   Value: 09/06/2013 03:28     [facility]   Culture     Final   Value:        BLOOD CULTURE RECEIVED NO GROWTH TO DATE CULTURE WILL BE HELD FOR 5 DAYS BEFORE ISSUING A FINAL NEGATIVE REPORT     [facility]   Report Status PENDING   Incomplete  MRSA PCR SCREENING     Status: None   Collection Time    09/05/13 11:50 PM      Result  Value Ref Range Status   MRSA by PCR NEGATIVE  NEGATIVE Final   Comment:            The GeneXpert MRSA Assay (FDA     approved for NASAL specimens     only), is one component of a     comprehensive MRSA colonization     surveillance program. It is not     intended to diagnose MRSA     infection nor to guide or     monitor treatment for     MRSA infections.    Studies/Results: Dg Chest 1 View  09/07/2013   CLINICAL DATA:  Increasing shortness of breath, being treated for infection  EXAM: CHEST - 1 VIEW  COMPARISON:  09/05/2013  FINDINGS: Status post CABG with mild stable cardiac enlargement. Moderate to severe vascular congestion. Decreased interstitial prominence. Small bilateral pleural effusions.  IMPRESSION: Stable vascular congestion with improving pulmonary edema.   Electronically Signed   By: Esperanza Heiraymond  Rubner M.D.   On: 09/07/2013 14:03   Dg Chest Port 1 View  09/05/2013   CLINICAL DATA:  Fever.  Short of breath.  EXAM: PORTABLE CHEST - 1 VIEW  COMPARISON:  08/30/2013.  FINDINGS: Mild enlargement of the cardiopericardial silhouette. Lower cervical ACDF. CABG. Calcified granuloma in the right lung. There is diffuse interstitial prominence, most compatible with interstitial pulmonary edema. Chronic blunting of the right costophrenic angle. There is no focal consolidation identified.  IMPRESSION: Cardiomegaly and interstitial pulmonary edema compatible with mild CHF.   Electronically Signed   By: Andreas NewportGeoffrey  Lamke M.D.   On: 09/05/2013 21:16    Assessment: I suspect that his recent fever was due to Pseudomonas and/or Escherichia coli UTI related to his bladder outlet obstruction and need for and out catheterization. The Pseudomonas is resistant to all oral antibiotic agents. He will need a midline catheter or PICC and 14 days of IV cefepime.  Plan: 1. Midline catheter placement 2. Continue cefepime 12 more days  Cliffton AstersJohn Sequoyah Counterman, MD [facility] [phone] pager   [phone] 09/07/2013, 4:35 PM

## 2013-09-07 NOTE — Consult Note (Signed)
[facility] Medical Consultation  JOANNA HALL ZOX:[id number] DOB: [birth year] DOA: 09/05/2013 PCP: Fredirick Maudlin, MD   Requesting physician: Jeral Fruit Date of consultation: 09/07/13 Reason for consultation: dyspnea  Impression/Recommendations   Dyspnea with chest discomfort: Suspect CHF related. Has had orthopnea, leg edema chest x-ray findings consistent. No h/o CHF. EKG shows nothing acute. Will give lasix, oxygen, proBNP, troponin, echo.  I see no echo in chart.  Had a stress test last year, but I am unable to see report. Recommend stopping the IV fluid. I discussed plans with Dr. Jeral Fruit.   CAD, stent in 2012. Previously followed by Dr. Dietrich Pates   HYPERTENSION   UTI (urinary tract infection): ID following   Lumbar degenerative disc disease   Cauda equina syndrome with neurogenic bladder: will order cath q4h per patient request   Altered mental status resolved  Thanks, Dr. Jeral Fruit for this consult.  [facility] will continue to follow.  HPI:  73 y.o. male with spinal stenosis and degenerative disc disease who underwent lumbar surgery in November. It was complicated by cauda equina syndrome and CSF leak. He was readmitted to the hospital in early January with severe, increased back pain. MRI revealed fluid in the operative bed. He underwent incision and drainage. Operative Gram stain and cultures were negative but he was on antibiotics prior to surgery. My partner, Dr. Jerolyn Center, initially recommended 6 weeks of IV vancomycin, cefepime and rifampin. However he did not feel he could afford the cost of outpatient IV antibiotic therapy. This combined with the fact that there was no definitive evidence of infection led her to switch him to oral trimethoprim sulfamethoxazole and rifampin on January 23. He was feeling much better with decreased back pain on discharge. He had trouble tolerating rifampin and it was stopped on February 3 but he continued trimethoprim-sulfamethoxazole.   He has had bladder outlet obstruction and has been requiring self-catheterization at home. He recently developed some suprapubic discomfort and yesterday developed fever and chills leading to readmission. 2/10.  He has had no increase in back pain and no wound drainage.   Today, patient developed dyspnea, anxiety, heart pounding and vague chest discomfort. Hospitalist consult was requested stat for evaluation. Patient reports that his legs have become more swollen recently. Since being admitted however, the swelling has improved. Last night he started feeling short of breath with orthopnea. He noticed that he felt better sitting upright for chest x-ray just now. He feels like he is having a panic attack. He's never had panic disorder before however. He has no history of heart failure. She had a nuclear stress test last year which was reportedly normal. I am unable to view the report in Epic. Stat EKG shows no acute changes. Patient has reportedly been in sinus rhythm on telemetry today. Chest x-ray on 09/05/2013 shows CHF. Stat chest x-ray today actually shows slight improvement.  Review of Systems:  Systems reviewed. As above otherwise negative.  Past Medical History  Diagnosis Date  . Hyperlipidemia   . Small bowel problem     HAD ALOT OF SCAR TISSUE FROM PREVIOUS SURGERIES..NG WAS INSERTED ...Marland KitchenMarland KitchenNO SURGERY NEEDED...Marland KitchenMarland KitchenIN FOR 8 DAYS  . Disorder of blood     BEEN TREATED BY DERMATOLOGIST X 4 YRS..."BLOOD BLISTERS"  . Arthritis   . Gout   . Anxiety   . Hx MRSA infection     rt shoulder  . Pneumonia     "I've had it 3-4 times"  . Coronary artery disease  IN 2000   STENT PLACED IN 2012 sees Dr. Dietrich Patesothbart, saw last 2013  . HTN (hypertension)     [place] Dr. [place]  . Small bowel obstruction   . Ventral hernia   . Acute renal failure 04/17/2013  . 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 Sternal surg  2000     HAS HAD 5-6 ON HIS STERNUM; "caught MRSA in it"  . Lumbar laminectomy/decompression microdiscectomy  06/13/2011    Procedure: LUMBAR LAMINECTOMY/DECOMPRESSION MICRODISCECTOMY;  Surgeon: Karn CassisErnesto M Botero;  Location: [facility];  Service: Neurosurgery;  Laterality: N/A;  Lumbar three, lumbar four-five Laminectomy  . Eye surgery      bilateral cataract  . Anterior cervical corpectomy  12/17/11  . Incision and drainage of wound  ~ 20ll; 12/03/11    "had infection in my right"  . Shoulder open rotator cuff repair  ~ 2011    right  . Peripherally inserted central catheter insertion  2011 & 11/2011  . Cataract extraction w/ intraocular lens  implant, bilateral  ? 2011  . Coronary artery bypass graft  2000    CABG X5  . Back surgery      lumbar  . Tonsillectomy  1949  . Cholecystectomy  2006 "or after"  . Coronary angioplasty with stent placement  2012  . Anterior cervical corpectomy  12/17/2011    Procedure: ANTERIOR CERVICAL CORPECTOMY;  Surgeon: Karn CassisErnesto M Botero, MD;  Location: [facility];  Service: Neurosurgery;  Laterality: N/A;  Anterior Cervical Decompression Fusion Five to Thoracic Two with plating  . Lumbar laminectomy/decompression microdiscectomy Right 06/17/2013    Procedure: LUMBAR LAMINECTOMY/DECOMPRESSION MICRODISCECTOMY 1 LEVEL;  Surgeon: Karn CassisErnesto M Botero, MD;  Location: [facility];  Service: Neurosurgery;  Laterality: Right;  Right L3-4 Microdiskectomy  . Lumbar wound debridement N/A 08/02/2013    Procedure: INCISION AND DRAINAGE OF LUMBAR WOUND DEBRIDEMENT;  Surgeon: Karn CassisErnesto M Botero, MD;  Location: [facility];  Service: Neurosurgery;  Laterality: N/A;   Social History:  reports that he quit smoking about 14 years ago. His smoking use included Cigarettes. He has a 1 pack-year smoking history. He has never used smokeless tobacco. He reports that he does not drink alcohol or use illicit drugs.  Allergies  Allergen Reactions  . Morphine Other (See Comments)    REACTION: made  him go crazy; "I had fun w/it; my family didn't think it was too funny"  . Metoprolol Other (See Comments)    REACTION:  Tachycardia; "don't remember how bad it was; it was so long ago"  . Rifampin     Itch    Family History  Problem Relation Age of Onset  . Heart disease    . Hypertension Mother   . Hypertension Maternal Aunt   . Hypertension Maternal Uncle   . Anesthesia problems Neg Hx   . Hypotension Neg Hx   . Malignant hyperthermia Neg Hx   . Pseudochol deficiency Neg Hx     Prior to Admission medications   Medication Sig Start Date End Date Taking? Authorizing Provider  acetaminophen (TYLENOL) 325 MG tablet Take 1-2 tablets (325-650 mg total) by mouth every 4 (four) hours as needed for mild pain. 08/19/13  Yes Evlyn KannerPamela S Love, PA-C  atorvastatin (LIPITOR) 40 MG tablet Take 1 tablet (40 mg total) by mouth daily. 04/27/13  Yes Jodelle GrossKathryn M Lawrence, NP  camphor-menthol [facility](SARNA) lotion Apply topically 3 (  three) times daily after meals. 08/19/13  Yes Evlyn Kanner Love, PA-C  citalopram (CELEXA) 40 MG tablet Take 40 mg by mouth daily.   Yes Historical Provider, MD  colchicine 0.6 MG tablet Take 1 tablet (0.6 mg total) by mouth 2 (two) times daily. 08/19/13  Yes Evlyn Kanner Love, PA-C  Cyanocobalamin (VITAMIN B 12 PO) Take 1 tablet by mouth daily.   Yes Historical Provider, MD  doxazosin (CARDURA) 4 MG tablet Take 8 mg by mouth at bedtime. 10/29/12  Yes Jodelle Gross, NP  gabapentin (NEURONTIN) 300 MG capsule Take 2 capsules (600 mg total) by mouth 3 (three) times daily. 08/19/13  Yes Evlyn Kanner Love, PA-C  methocarbamol (ROBAXIN) 500 MG tablet Take 1 tablet (500 mg total) by mouth 4 (four) times daily as needed for muscle spasms. 08/19/13  Yes Evlyn Kanner Love, PA-C  nitroGLYCERIN (NITROSTAT) 0.4 MG SL tablet Place 0.4 mg under the tongue every 5 (five) minutes as needed. For chest pain 01/09/11  Yes Jodelle Gross, NP  OxyCODONE (OXYCONTIN) 20 mg T12A 12 hr tablet Take 1 tablet (20 mg total) by mouth  every 12 (twelve) hours. 08/19/13  Yes Evlyn Kanner Love, PA-C  predniSONE (DELTASONE) 5 MG tablet Take 5 mg by mouth daily with breakfast.   Yes Historical Provider, MD  sulfamethoxazole-trimethoprim (BACTRIM DS) 800-160 MG per tablet Take 1 tablet by mouth every 12 (twelve) hours. 08/19/13  Yes Evlyn Kanner Love, PA-C  tamsulosin (FLOMAX) 0.4 MG CAPS capsule Take 1 capsule (0.4 mg total) by mouth daily after breakfast. 07/08/13  Yes Evlyn Kanner Love, PA-C  vitamin C (ASCORBIC ACID) 500 MG tablet Take 500 mg by mouth daily.   Yes Historical Provider, MD  VITAMIN D, CHOLECALCIFEROL, PO Take 2-3 tablets by mouth daily.   Yes Historical Provider, MD   Physical Exam: Blood pressure 164/74, pulse 65, temperature 98.3 F (36.8 C), temperature source Oral, resp. rate 18, height 6' (1.829 m), weight 90 kg (198 lb 6.6 oz), SpO2 92.00%. Filed Vitals:   09/07/13 1000  BP: 164/74  Pulse: 65  Temp: 98.3 F (36.8 C)  Resp: 18   BP 169/82  Pulse 67  Temp(Src) 98 F (36.7 C) (Oral)  Resp 18  Ht 6' (1.829 m)  Wt 90 kg (198 lb 6.6 oz)  BMI 26.90 kg/m2  SpO2 94%  General Appearance:    Alert, cooperative, slightly anxious. Able to speak in full sentences. Oriented   Head:    Normocephalic, without obvious abnormality, atraumatic  Eyes:    PERRL, conjunctiva/corneas clear, EOM's intact, fundi    benign, both eyes          Nose:   Nares normal, septum midline, mucosa normal, no drainage   or sinus tenderness  Throat:   Lips, mucosa, and tongue normal; teeth and gums normal  Neck:   Supple, no significant JVD   Back:     Symmetric, no curvature, ROM normal, no CVA tenderness  Lungs:     minimal occasional rales at bases. No wheezes or rhonchi. Good air movement. Breathing nonlabored   Chest wall:    No tenderness or deformity  Heart:    Regular rate and rhythm, S1 and S2 normal, no murmur, rub   or gallop  Abdomen:     Soft, non-tender, bowel sounds active all four quadrants,    no masses, no organomegaly   Genitalia:   deferred   Rectal:   deferred   Extremities:   Extremities normal, atraumatic,  no cyanosis or 1+ ankle and pedal edema.   Pulses:   2+ and symmetric all extremities  Skin:   Skin color, texture, turgor normal, no rashes or lesions  Lymph nodes:   Cervical, supraclavicular, and axillary nodes normal  Neurologic:   CNII-XII intact. Normal strength, sensation and reflexes      throughout    Psychiatric: Anxious appearing. Cooperative.  Labs on Admission:  Basic Metabolic Panel:  Recent Labs Lab 09/05/13 1252  NA 136*  K 4.1  CL 97  CO2 26  GLUCOSE 115*  BUN 13  CREATININE 1.80*  CALCIUM 8.6   Liver Function Tests:  Recent Labs Lab 09/05/13 1252  AST 17  ALT 11  ALKPHOS 57  BILITOT 0.3  PROT 5.4*  ALBUMIN 2.9*   No results found for this basename: LIPASE, AMYLASE,  in the last 168 hours No results found for this basename: AMMONIA,  in the last 168 hours CBC:  Recent Labs Lab 09/05/13 1252  WBC 11.6*  NEUTROABS 9.8*  HGB 8.7*  HCT 25.9*  MCV 88.4  PLT 141*   Cardiac Enzymes: No results found for this basename: CKTOTAL, CKMB, CKMBINDEX, TROPONINI,  in the last 168 hours BNP: No components found with this basename: POCBNP,  CBG: No results found for this basename: GLUCAP,  in the last 168 hours  Radiological Exams on Admission: Dg Chest 1 View  09/07/2013   CLINICAL DATA:  Increasing shortness of breath, being treated for infection  EXAM: CHEST - 1 VIEW  COMPARISON:  09/05/2013  FINDINGS: Status post CABG with mild stable cardiac enlargement. Moderate to severe vascular congestion. Decreased interstitial prominence. Small bilateral pleural effusions.  IMPRESSION: Stable vascular congestion with improving pulmonary edema.   Electronically Signed   By: Esperanza Heir M.D.   On: 09/07/2013 14:03   Mr Lumbar Spine W Wo Contrast  09/05/2013   CLINICAL DATA:  Fever and chills.  Recent epidural abscess.  EXAM: MRI LUMBAR SPINE WITHOUT AND WITH CONTRAST   TECHNIQUE: Multiplanar and multiecho pulse sequences of the lumbar spine were obtained without and with intravenous contrast.  CONTRAST:  9mL MULTIHANCE GADOBENATE DIMEGLUMINE 529 MG/ML IV SOLN  COMPARISON:  Radiographs dated 08/15/2013 and lumbar MRI dated 07/29/2013 and 06/07/2013  FINDINGS: There is a tiny residual amount of fluid adjacent to the anterior medial aspect of the right facet joint at L3-4 seen on images 23 and 24 series 5. This fluid in appearance to extend into the facet joint at the level of the previous more prominent epidural abscess.  There are connecting midline fluid collections along the incision which could represent seroma but given the patient's history, this could represent pus in the posterior soft tissues. This is best seen on images 7 and 8 of series 4.  There are postsurgical changes at the disc spaces at L4-5 and L5-S1 without evidence of osteomyelitis or discitis. There is a tiny amount of fluid in the L5-S1 disc space. I doubt that this represents infection.  There is small broad-based soft disc protrusion at L3-4 which compresses the the thecal sac relatively severely. This is best seen on image 24 of series 5.  T12-L1 and L1-2:  Normal.  L2-3: Posterior decompression. Tiny broad-based disc bulge with no neural impingement. Tiny amount of fluid adjacent to the inferior aspect of the left facet joint at L2-3 which could represent pus and probably communicates with the other fluid collections. This is seen on image 11 of series 4 and image 19 of  series 5.  IMPRESSION: 1. Tiny amount of residual fluid in the right posterior lateral aspect of the epidural space at L3-4. The majority of epidural fluid seen on the prior exam has been evacuated. 2. Multiple enhancing fluid collections in the posterior paraspinal soft tissues which could represent abscesses, as described. 3. Compression of the thecal sac at L3-4 due to a broad-based disc protrusion.   Electronically Signed   By: Geanie Cooley M.D.   On: 09/05/2013 15:14   Dg Chest Port 1 View  09/05/2013   CLINICAL DATA:  Fever.  Short of breath.  EXAM: PORTABLE CHEST - 1 VIEW  COMPARISON:  08/30/2013.  FINDINGS: Mild enlargement of the cardiopericardial silhouette. Lower cervical ACDF. CABG. Calcified granuloma in the right lung. There is diffuse interstitial prominence, most compatible with interstitial pulmonary edema. Chronic blunting of the right costophrenic angle. There is no focal consolidation identified.  IMPRESSION: Cardiomegaly and interstitial pulmonary edema compatible with mild CHF.   Electronically Signed   By: Andreas Newport M.D.   On: 09/05/2013 21:16    EKG: Normal sinus rhythm  Christiane Ha, M.D. [facility] Pager [phone]  If 7PM-7AM, please contact night-coverage [email or web address] Password [email or web address] 09/07/2013, 2:21 PM

## 2013-09-07 NOTE — Progress Notes (Signed)
Called about troponin 0.5. proBNP >20K. No CP. Feels better after lasix will give asa lovenox, beta blocker. Trend troponins and await echo. Continue lasix.  Crista Curborinna Bob Daversa, M.D. [phone]

## 2013-09-07 NOTE — Progress Notes (Signed)
Patient ID: [name], male   DOB: 01/10/1941, 73 y.o.   MRN: [id number] C/o chest pain with sob. hospitalist to help with his care. Had a chest x-ray and ekg. Waiting for input from ID. [facility]

## 2013-09-07 NOTE — Progress Notes (Signed)
ANTICOAGULATION CONSULT NOTE - Initial Consult  Pharmacy Consult for Lovenox Indication: chest pain/ACS  Allergies  Allergen Reactions  . Morphine Other (See Comments)    REACTION: made him go crazy; "I had fun w/it; my family didn't think it was too funny"  . Metoprolol Other (See Comments)    REACTION:  Tachycardia; "don't remember how bad it was; it was so long ago"  . Rifampin     Itch     Patient Measurements: Height: 6' (182.9 cm) Weight: 198 lb 6.6 oz (90 kg) IBW/kg (Calculated) : 77.6  Vital Signs: Temp: 98 F (36.7 C) (02/11 1400) Temp src: Oral (02/11 1400) BP: 169/82 mmHg (02/11 1400) Pulse Rate: 67 (02/11 1400)  Labs:  Recent Labs  09/05/13 1252 09/07/13 1547  HGB 8.7* 8.4*  HCT 25.9* 24.9*  PLT 141* 129*  CREATININE 1.80* 1.34  TROPONINI  --  0.56*    Estimated Creatinine Clearance: 56.3 ml/min (by C-G formula based on Cr of 1.34).   Medical History: Past Medical History  Diagnosis Date  . Hyperlipidemia   . Small bowel problem     HAD ALOT OF SCAR TISSUE FROM PREVIOUS SURGERIES..NG WAS INSERTED ...Marland Kitchen.Marland Kitchen.NO SURGERY NEEDED...Marland Kitchen.Marland Kitchen.IN FOR 8 DAYS  . Disorder of blood     BEEN TREATED BY DERMATOLOGIST X 4 YRS..."BLOOD BLISTERS"  . Arthritis   . Gout   . Anxiety   . Hx MRSA infection     rt shoulder  . Pneumonia     "I've had it 3-4 times"  . Coronary artery disease     IN 2000   STENT PLACED IN 2012 sees Dr. Dietrich Patesothbart, saw last 2013  . HTN (hypertension)     [place] Dr. Juanetta [place]Hawkins in [place]  . Small bowel obstruction   . Ventral hernia   . Acute renal failure 04/17/2013  . 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 [name], PharmD [phone]  [name],  [name] 09/07/2013,8:14 PM

## 2013-09-08 DIAGNOSIS — I2510 Atherosclerotic heart disease of native coronary artery without angina pectoris: Secondary | ICD-10-CM

## 2013-09-08 DIAGNOSIS — R0989 Other specified symptoms and signs involving the circulatory and respiratory systems: Secondary | ICD-10-CM

## 2013-09-08 DIAGNOSIS — R799 Abnormal finding of blood chemistry, unspecified: Secondary | ICD-10-CM

## 2013-09-08 DIAGNOSIS — I059 Rheumatic mitral valve disease, unspecified: Secondary | ICD-10-CM

## 2013-09-08 DIAGNOSIS — R0609 Other forms of dyspnea: Secondary | ICD-10-CM

## 2013-09-08 LAB — IRON AND TIBC
Iron: 15 ug/dL — ABNORMAL LOW (ref 42–135)
Saturation Ratios: 7 % — ABNORMAL LOW (ref 20–55)
TIBC: 202 ug/dL — ABNORMAL LOW (ref 215–435)
UIBC: 187 ug/dL (ref 125–400)

## 2013-09-08 LAB — FERRITIN: Ferritin: 11 ng/mL — ABNORMAL LOW (ref 22–322)

## 2013-09-08 LAB — BASIC METABOLIC PANEL
BUN: 29 mg/dL — ABNORMAL HIGH (ref 6–23)
CO2: 25 mEq/L (ref 19–32)
Calcium: 8.6 mg/dL (ref 8.4–10.5)
Chloride: 100 mEq/L (ref 96–112)
Creatinine, Ser: 1.35 mg/dL (ref 0.50–1.35)
GFR, EST AFRICAN AMERICAN: 60 mL/min — AB (ref >90)
GFR, EST NON AFRICAN AMERICAN: 52 mL/min — AB (ref >90)
Glucose, Bld: 116 mg/dL — ABNORMAL HIGH (ref 70–99)
POTASSIUM: 3.9 meq/L (ref 3.7–5.3)
SODIUM: 137 meq/L (ref 137–147)

## 2013-09-08 LAB — CBC
HCT: 27.7 % — ABNORMAL LOW (ref 39.0–52.0)
Hemoglobin: 9.1 g/dL — ABNORMAL LOW (ref 13.0–17.0)
MCH: 28.9 pg (ref 26.0–34.0)
MCHC: 32.9 g/dL (ref 30.0–36.0)
MCV: 87.9 fL (ref 78.0–100.0)
Platelets: 151 10*3/uL (ref 150–400)
RBC: 3.15 MIL/uL — AB (ref 4.22–5.81)
RDW: 15.7 % — ABNORMAL HIGH (ref 11.5–15.5)
WBC: 7.1 10*3/uL (ref 4.0–10.5)

## 2013-09-08 LAB — TROPONIN I: TROPONIN I: 0.51 ng/mL — AB (ref <0.30)

## 2013-09-08 LAB — RETICULOCYTES
RBC.: 3.15 MIL/uL — AB (ref 4.22–5.81)
RETIC CT PCT: 1.5 % (ref 0.4–3.1)
Retic Count, Absolute: 47.3 10*3/uL (ref 19.0–186.0)

## 2013-09-08 LAB — VITAMIN B12: VITAMIN B 12: 419 pg/mL (ref 211–911)

## 2013-09-08 LAB — FOLATE: FOLATE: 10 ng/mL

## 2013-09-08 LAB — PRO B NATRIURETIC PEPTIDE: PRO B NATRI PEPTIDE: 33476 pg/mL — AB (ref 0–125)

## 2013-09-08 MED ORDER — SODIUM CHLORIDE 0.9 % IJ SOLN
10.0000 mL | INTRAMUSCULAR | Status: DC | PRN
  Administered 2013-09-09: 10 mL
  Administered 2013-09-10: 20 mL
  Administered 2013-09-11: 10 mL
  Administered 2013-09-12: 20 mL
  Administered 2013-09-16 – 2013-09-18 (×3): 10 mL

## 2013-09-08 MED ORDER — PREDNISONE 10 MG PO TABS
10.0000 mg | ORAL_TABLET | Freq: Every day | ORAL | Status: DC
  Administered 2013-09-09 – 2013-09-20 (×12): 10 mg via ORAL
  Filled 2013-09-08 (×14): qty 1

## 2013-09-08 NOTE — Progress Notes (Signed)
Physical Therapy Treatment Patient Details Name: Jake MarvelRonald W Speedy MRN: [id number] DOB: 01/27/1941 Today's Date: 09/08/2013 Time: [id number] PT Time Calculation (min): 33 min  PT Assessment / Plan / Recommendation  History of Present Illness patient who this am started to complain of shakeness, diaphoresis, decrease of loc, increase ot temperature. [facility]  Where he had a mri of the lumbar spine. Started on abs and we [facility]. He does not know why is here, has some mild headache and some pain in the left foot(hx of gout   PT Comments   Patient agreeable to ambulation. Patient had to use bedpan on arrival and continued to soil uncontrolled with ambulation. Ace wrapped L foot as patient without AFO however patients wife in after session and bought AFO. Will use next session  Follow Up Recommendations  Home health PT;Supervision/Assistance - 24 hour     Does the patient have the potential to tolerate intense rehabilitation     Barriers to Discharge        Equipment Recommendations  None recommended by PT    Recommendations for Other Services    Frequency Min 3X/week   Progress towards PT Goals Progress towards PT goals: Progressing toward goals  Plan Current plan remains appropriate    Precautions / Restrictions Precautions Precautions: Back;Fall   Pertinent Vitals/Pain no apparent distress     Mobility  Bed Mobility Overal bed mobility: Modified Independent Rolling: Min assist General bed mobility comments: increased time, using momentum to propel self forward Transfers Overall transfer level: Needs assistance Equipment used: Rolling walker (2 wheeled) Sit to Stand: Min assist;+2 physical assistance;+2 safety/equipment General transfer comment: pt uses rocking forward/momentum to bring self forward, assist to steady pt and provide LE stability.  Ambulation/Gait Ambulation/Gait assistance: Mod assist Ambulation Distance (Feet):  15 Feet Assistive device: Rolling walker (2 wheeled) Gait velocity: slow General Gait Details: L foot ace wrapped to assist with drop foot, pt with ataxic gait pattern, narrow base of support, pt shaky.     Exercises     PT Diagnosis:    PT Problem List:   PT Treatment Interventions:     PT Goals (current goals can now be found in the care plan section)    Visit Information  Last PT Received On: 09/08/13 Assistance Needed: +2 History of Present Illness: patient who this am started to complain of shakeness, diaphoresis, decrease of loc, increase ot temperature. [facility]  Where he had a mri of the lumbar spine. Started on abs and we [facility]. He does not know why is here, has some mild headache and some pain in the left foot(hx of gout    Subjective Data      Cognition  Cognition Arousal/Alertness: Awake/alert Behavior During Therapy: WFL for tasks assessed/performed Overall Cognitive Status: Within Functional Limits for tasks assessed    Balance  Balance Sitting balance-Leahy Scale: Fair Standing balance-Leahy Scale: Poor  End of Session PT - End of Session Equipment Utilized During Treatment: Gait belt Activity Tolerance: Patient tolerated treatment well Patient left: in chair;with call bell/phone within reach Nurse Communication: Mobility status   GP     Fredrich BirksRobinette, Loveah Like Elizabeth 09/08/2013, 2:59 PM 09/08/2013 Fredrich Birksobinette, [place] PTA [phone] pager [phone] office

## 2013-09-08 NOTE — Progress Notes (Signed)
Patient ID: [name], male   DOB: 02/09/1941, 73 y.o.   MRN: [id number] Spoke with hospitalist.ID note read. Cardiologist to see him today. Clinically stable, no chest pain . No sob

## 2013-09-08 NOTE — Progress Notes (Addendum)
[facility] CONSULT NOTE/ PROGRESS NOTE  [name] ZOX:[id number] DOB: 08/21/1940 DOA: 09/05/2013 PCP: [name],[name], MD  Assessment/Plan: #1 acute CHF exacerbation Clinical improvement. Cardiac enzymes elevated. 2-D echo is pending. Pro BNP is elevated. I/O = -6L. Current weight is 90 kg. Continue IV Lasix. Due to elevated troponins we'll consult with cardiology for further evaluation and management.  #2 dyspnea Secondary to problem #1. Improved.  #3 fever/chills Likely secondary to Pseudomonas and/or Escherichia coli UTI related to bladder outlet obstruction or I D. Patient currently on IV cefepime. Per ID.  #4. Escherichia coli UTI On IV cefepime. Twice a day.  #5 elevated troponins May be secondary to problem #1. Patient currently chest pain-free. 2-D echo is pending. Continue aspirin, Lipitor, Coreg, IV Lasix. Follow.  #6 probable soft tissue abscess in the lumbar region Per primary team. Continue current IV antibiotics.  Code Status: Full Family Communication: Updated patient no family present. Disposition Plan: Per primary team   Consultants: [facility] : Dr. [name] 09/07/2013  Infectious diseases: Dr. [name] 09/06/2013  Cardiology pending  Procedures:  Chest x-ray 09/07/2013, 09/05/2013  MRI of the L-spine 09/05/2013  2-D echo pending  Antibiotics:  IV cefepime 09/06/2013  IV Rocephin t2/9/15-->09/05/13  Oral Bactrim 09/05/2013 --> 09/06/2013  IV vancomycin 09/05/2013----> 09/05/2013  HPI/Subjective: Patient states his breathing has improved since yesterday.  Objective: Filed Vitals:   09/08/13 0846  BP: 144/63  Pulse: 63  Temp: 97.6 F (36.4 C)  Resp: 18    Intake/Output Summary (Last 24 hours) at 09/08/13 1053 Last data filed at 09/08/13 0900  Gross per 24 hour  Intake    240 ml  Output   3775 ml  Net  -3535 ml   Filed Weights   09/05/13 1946  Weight: 90 kg (198 lb 6.6 oz)    Exam:   General:   NAD  Cardiovascular: Regular rate rhythm no murmurs rubs or gallops.  Respiratory: Bibasilar crackles  Abdomen: Soft, nontender, nondistended, positive bowel sounds  Musculoskeletal: No clubbing or cyanosis. 2+ bilateral lower extremity edema  Data Reviewed: Basic Metabolic Panel:  Recent Labs Lab 09/05/13 1252 09/07/13 1547  NA 136* 136*  K 4.1 4.5  CL 97 100  CO2 26 23  GLUCOSE 115* 136*  BUN 13 26*  CREATININE 1.80* 1.34  CALCIUM 8.6 8.8   Liver Function Tests:  Recent Labs Lab 09/05/13 1252  AST 17  ALT 11  ALKPHOS 57  BILITOT 0.3  PROT 5.4*  ALBUMIN 2.9*   No results found for this basename: LIPASE, AMYLASE,  in the last 168 hours No results found for this basename: AMMONIA,  in the last 168 hours CBC:  Recent Labs Lab 09/05/13 1252 09/07/13 1547 09/08/13 0837  WBC 11.6* 9.0 7.1  NEUTROABS 9.8* 8.1*  --   HGB 8.7* 8.4* 9.1*  HCT 25.9* 24.9* 27.7*  MCV 88.4 87.7 87.9  PLT 141* 129* 151   Cardiac Enzymes:  Recent Labs Lab 09/07/13 1547 09/07/13 1959 09/08/13 0448  TROPONINI 0.56* 0.55* 0.51*   BNP (last 3 results)  Recent Labs  08/30/13 2105 09/07/13 1547  PROBNP 738.4* 23778.0*   CBG: No results found for this basename: GLUCAP,  in the last 168 hours  Recent Results (from the past 240 hour(s))  URINE CULTURE     Status: None   Collection Time    09/02/13  9:51 AM      Result Value Ref Range Status   Specimen Description URINE, CATHETERIZED   Final  Special Requests NONE   Final   Culture  Setup Time     Final   Value: 09/03/2013 00:31     Performed at [facility]   Colony Count     Final   Value: >=100,000 COLONIES/ML     Performed at [facility]   Culture     Final   Value: PSEUDOMONAS AERUGINOSA     Performed at [facility]   Report Status 09/04/2013 FINAL   Final   Organism ID, Bacteria PSEUDOMONAS AERUGINOSA   Final  URINE CULTURE     Status: None   Collection Time    09/05/13  1:35 PM       Result Value Ref Range Status   Specimen Description URINE, CATHETERIZED   Final   Special Requests NONE   Final   Culture  Setup Time     Final   Value: 09/05/2013 21:02     Performed at [facility] Count     Final   Value: >=100,000 COLONIES/ML     Performed at [facility]   Culture     Final   Value: ESCHERICHIA COLI     Performed at [facility]   Report Status 09/07/2013 FINAL   Final   Organism ID, Bacteria ESCHERICHIA COLI   Final  CULTURE, BLOOD (ROUTINE X 2)     Status: None   Collection Time    09/05/13 11:15 PM      Result Value Ref Range Status   Specimen Description BLOOD RIGHT ANTECUBITAL   Final   Special Requests BOTTLES DRAWN AEROBIC ONLY 10CC   Final   Culture  Setup Time     Final   Value: 09/06/2013 03:28     Performed at [facility]   Culture     Final   Value:        BLOOD CULTURE RECEIVED NO GROWTH TO DATE CULTURE WILL BE HELD FOR 5 DAYS BEFORE ISSUING A FINAL NEGATIVE REPORT     Performed at [facility]   Report Status PENDING   Incomplete  CULTURE, BLOOD (ROUTINE X 2)     Status: None   Collection Time    09/05/13 11:20 PM      Result Value Ref Range Status   Specimen Description BLOOD RIGHT HAND   Final   Special Requests BOTTLES DRAWN AEROBIC ONLY 10CC   Final   Culture  Setup Time     Final   Value: 09/06/2013 03:28     Performed at [facility]   Culture     Final   Value:        BLOOD CULTURE RECEIVED NO GROWTH TO DATE CULTURE WILL BE HELD FOR 5 DAYS BEFORE ISSUING A FINAL NEGATIVE REPORT     Performed at [facility]   Report Status PENDING   Incomplete  MRSA PCR SCREENING     Status: None   Collection Time    09/05/13 11:50 PM      Result Value Ref Range Status   MRSA by PCR NEGATIVE  NEGATIVE Final   Comment:            The GeneXpert MRSA Assay (FDA     approved for NASAL specimens     only), is one component of a     comprehensive MRSA colonization      surveillance program. It is not     intended to diagnose  MRSA     infection nor to guide or     monitor treatment for     MRSA infections.     Studies: Dg Chest 1 View  09/07/2013   CLINICAL DATA:  Increasing shortness of breath, being treated for infection  EXAM: CHEST - 1 VIEW  COMPARISON:  09/05/2013  FINDINGS: Status post CABG with mild stable cardiac enlargement. Moderate to severe vascular congestion. Decreased interstitial prominence. Small bilateral pleural effusions.  IMPRESSION: Stable vascular congestion with improving pulmonary edema.   Electronically Signed   By: Esperanza Heir M.D.   On: 09/07/2013 14:03    Scheduled Meds: . aspirin EC  81 mg Oral Daily  . atorvastatin  40 mg Oral Daily  . carvedilol  6.25 mg Oral BID WC  . ceFEPime (MAXIPIME) IV  1 g Intravenous Q12H  . citalopram  40 mg Oral Daily  . colchicine  0.6 mg Oral BID  . doxazosin  8 mg Oral QHS  . enoxaparin (LOVENOX) injection  90 mg Subcutaneous Q12H  . furosemide  40 mg Intravenous BID  . gabapentin  600 mg Oral TID  . OxyCODONE  20 mg Oral Q12H  . [START ON 09/09/2013] predniSONE  10 mg Oral Q breakfast  . tamsulosin  0.4 mg Oral QPC breakfast  . vitamin C  500 mg Oral Daily   Continuous Infusions:   Principal Problem:   Fever and chills Active Problems:   HYPERTENSION   CAD in native artery   Lumbar degenerative disc disease   Cauda equina syndrome with neurogenic bladder   Altered mental status   Dyspnea   UTI (urinary tract infection)   CHF (congestive heart failure)   Elevated troponin    Time spent: 40 mins    [facility] MD [facility] Pager [phone]. If 7PM-7AM, please contact night-coverage at [email or web address], password [email or web address] 09/08/2013, 10:53 AM  LOS: 3 days

## 2013-09-08 NOTE — Progress Notes (Signed)
PT Cancellation Note  Patient Details Name: [name] MRN: [id number] DOB: 03/25/1941   Cancelled Treatment:    Reason Eval/Treat Not Completed: Patient at procedure or test/unavailable. Will follow up as able   Robinette, [name] 09/08/2013, 10:17 AM

## 2013-09-08 NOTE — Progress Notes (Signed)
Peripherally Inserted Central Catheter/Midline Placement  The IV Nurse has discussed with the patient and/or persons authorized to consent for the patient, the purpose of this procedure and the potential benefits and risks involved with this procedure.  The benefits include less needle sticks, lab draws from the catheter and patient may be discharged home with the catheter.  Risks include, but not limited to, infection, bleeding, blood clot (thrombus formation), and puncture of an artery; nerve damage and irregular heat beat.  Alternatives to this procedure were also discussed.  PICC/Midline Placement Documentation        Vevelyn PatDuncan, Sherylann Vangorden Jean 09/08/2013, 10:56 AM

## 2013-09-08 NOTE — Consult Note (Signed)
Admit date: 09/05/2013 Referring Physician  Dr. Jeral Fruit Primary Physician  [name] Primary Cardiologist  Dr. Verlin Grills. [name] Reason for Consultation  Elevated troponin  HPI: 73 y/o man with h/o CAD, s/p CABG nad PCI, most recently in 2012.  He has had a complicated course s/p back surgery in [place] 2014.  He has had some CP with dyspnea.  His troponin was found to be elevated.    Troponins have been in the 0.5 range.  BNP > 23,000.  We are asked to further evaluate.   Currently, breathing is much better.  He diuresed quite a bit yesterday.  No CP.  No sx like what he had prior to CABG or PCI.  He normally follows with Joni Reining in [place].     PMH:   Past Medical History  Diagnosis Date  . Hyperlipidemia   . Small bowel problem     HAD ALOT OF SCAR TISSUE FROM PREVIOUS SURGERIES..NG WAS INSERTED ...Marland KitchenMarland KitchenNO SURGERY NEEDED...Marland KitchenMarland KitchenIN FOR 8 DAYS  . Disorder of blood     BEEN TREATED BY DERMATOLOGIST X 4 YRS..."BLOOD BLISTERS"  . Arthritis   . Gout   . Anxiety   . Hx MRSA infection     rt shoulder  . Pneumonia     "I've had it 3-4 times"  . Coronary artery disease     IN 2000   STENT PLACED IN 2012 sees Dr. Dietrich Pates, saw last 2013  . HTN (hypertension)     sees [name] in [place]  . Small bowel obstruction   . Ventral hernia   . Acute renal failure 04/17/2013  . 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 Sternal surg  2000    HAS HAD 5-6 ON HIS STERNUM; "caught MRSA in it"  . Lumbar laminectomy/decompression microdiscectomy  06/13/2011    Procedure: LUMBAR LAMINECTOMY/DECOMPRESSION MICRODISCECTOMY;  Surgeon: Karn Cassis;  Location: [facility];  Service: Neurosurgery;  Laterality: N/A;  Lumbar three, lumbar four-five Laminectomy  . Eye surgery      bilateral cataract  . Anterior cervical corpectomy  12/17/11  . Incision and drainage of wound  ~ 20ll; 12/03/11    "had infection in  my right"  . Shoulder open rotator cuff repair  ~ 2011    right  . Peripherally inserted central catheter insertion  2011 & 11/2011  . Cataract extraction w/ intraocular lens  implant, bilateral  ? 2011  . Coronary artery bypass graft  2000    CABG X5  . Back surgery      lumbar  . Tonsillectomy  1949  . Cholecystectomy  2006 "or after"  . Coronary angioplasty with stent placement  2012  . Anterior cervical corpectomy  12/17/2011    Procedure: ANTERIOR CERVICAL CORPECTOMY;  Surgeon: Karn Cassis, MD;  Location: [facility];  Service: Neurosurgery;  Laterality: N/A;  Anterior Cervical Decompression Fusion Five to Thoracic Two with plating  . Lumbar laminectomy/decompression microdiscectomy Right 06/17/2013    Procedure: LUMBAR LAMINECTOMY/DECOMPRESSION MICRODISCECTOMY 1 LEVEL;  Surgeon: Karn Cassis, MD;  Location: [facility];  Service: Neurosurgery;  Laterality: Right;  Right L3-4 Microdiskectomy  . Lumbar wound debridement N/A 08/02/2013    Procedure: INCISION AND DRAINAGE OF LUMBAR WOUND DEBRIDEMENT;  Surgeon: Karn Cassis, MD;  Location: [facility];  Service: Neurosurgery;  Laterality: N/A;    Allergies:  Morphine; Metoprolol; and  Rifampin Prior to Admit Meds:   Prescriptions prior to admission  Medication Sig Dispense Refill  . acetaminophen (TYLENOL) 325 MG tablet Take 1-2 tablets (325-650 mg total) by mouth every 4 (four) hours as needed for mild pain.      Marland Kitchen atorvastatin (LIPITOR) 40 MG tablet Take 1 tablet (40 mg total) by mouth daily.  90 tablet  2  . camphor-menthol (SARNA) lotion Apply topically 3 (three) times daily after meals.  222 mL  1  . citalopram (CELEXA) 40 MG tablet Take 40 mg by mouth daily.      . colchicine 0.6 MG tablet Take 1 tablet (0.6 mg total) by mouth 2 (two) times daily.  60 tablet  1  . Cyanocobalamin (VITAMIN B 12 PO) Take 1 tablet by mouth daily.      Marland Kitchen doxazosin (CARDURA) 4 MG tablet Take 8 mg by mouth at bedtime.      . gabapentin  (NEURONTIN) 300 MG capsule Take 2 capsules (600 mg total) by mouth 3 (three) times daily.  180 capsule  1  . methocarbamol (ROBAXIN) 500 MG tablet Take 1 tablet (500 mg total) by mouth 4 (four) times daily as needed for muscle spasms.  75 tablet  1  . nitroGLYCERIN (NITROSTAT) 0.4 MG SL tablet Place 0.4 mg under the tongue every 5 (five) minutes as needed. For chest pain      . OxyCODONE (OXYCONTIN) 20 mg T12A 12 hr tablet Take 1 tablet (20 mg total) by mouth every 12 (twelve) hours.  60 tablet  0  . predniSONE (DELTASONE) 5 MG tablet Take 5 mg by mouth daily with breakfast.      . sulfamethoxazole-trimethoprim (BACTRIM DS) 800-160 MG per tablet Take 1 tablet by mouth every 12 (twelve) hours.  60 tablet  1  . tamsulosin (FLOMAX) 0.4 MG CAPS capsule Take 1 capsule (0.4 mg total) by mouth daily after breakfast.  30 capsule  1  . vitamin C (ASCORBIC ACID) 500 MG tablet Take 500 mg by mouth daily.      Marland Kitchen VITAMIN D, CHOLECALCIFEROL, PO Take 2-3 tablets by mouth daily.       Fam HX:    Family History  Problem Relation Age of Onset  . Heart disease    . Hypertension Mother   . Hypertension Maternal Aunt   . Hypertension Maternal Uncle   . Anesthesia problems Neg Hx   . Hypotension Neg Hx   . Malignant hyperthermia Neg Hx   . Pseudochol deficiency Neg Hx    Social HX:    History   Social History  . Marital Status: Married    Spouse Name: N/A    Number of Children: N/A  . Years of Education: N/A   Occupational History  . Not on file.   Social History Main Topics  . Smoking status: Former Smoker -- 1.00 packs/day for 1 years    Types: Cigarettes    Quit date: 12/27/1998  . Smokeless tobacco: Never Used  . Alcohol Use: No  . Drug Use: No  . Sexual Activity: Not Currently   Other Topics Concern  . Not on file   Social History Narrative  . No narrative on file     ROS:  All 11 ROS were addressed and are negative except what is stated in the HPI; Left foot weakness  Physical  Exam: Blood pressure 144/63, pulse 63, temperature 97.6 F (36.4 C), temperature source Oral, resp. rate 18, height 6' (1.829 m), weight 198 lb  6.6 oz (90 kg), SpO2 95.00%.    General: Well developed, well nourished, in no acute distress Head: Eyes PERRLA, No xanthomas.   Normal cephalic and atramatic  Lungs:  Scant basilar crackles Heart: HRRR S1 S2 Pulses are 2+ & equal.             No JVD.   Abdomen: Bowel sounds are positive, abdomen soft and non-tender without masses or                  Hernia's noted.  Extremities:  1+ bilateral pitting edema.  DP +1 Neuro: Alert and oriented X 3. Psych:  Normal affect, responds appropriately    Labs:   Lab Results  Component Value Date   WBC 7.1 09/08/2013   HGB 9.1* 09/08/2013   HCT 27.7* 09/08/2013   MCV 87.9 09/08/2013   PLT 151 09/08/2013    Recent Labs Lab 09/05/13 1252 09/07/13 1547  NA 136* 136*  K 4.1 4.5  CL 97 100  CO2 26 23  BUN 13 26*  CREATININE 1.80* 1.34  CALCIUM 8.6 8.8  PROT 5.4*  --   BILITOT 0.3  --   ALKPHOS 57  --   ALT 11  --   AST 17  --   GLUCOSE 115* 136*   No results found for this basename: PTT   Lab Results  Component Value Date   INR 1.11 08/02/2013   INR 0.91 06/09/2011   INR 1.04 12/31/2010   Lab Results  Component Value Date   CKTOTAL 63 12/30/2010   CKMB 6.2 RESULT REPEATED AND VERIFIED CRITICAL RESULT CALLED TO, READ BACK BY AND VERIFIED WITH: JOHNSTON,J ON 12/30/10 AT 1820 BY LOY,C* 12/30/2010   TROPONINI 0.51* 09/08/2013     Lab Results  Component Value Date   CHOL 135 10/19/2012   CHOL 190 12/31/2010   CHOL 131 01/09/2010   Lab Results  Component Value Date   HDL 45 10/19/2012   HDL 59 0/10/5407   HDL 42 01/09/2010   Lab Results  Component Value Date   LDLCALC 75 10/19/2012   LDLCALC  Value: 98        Total Cholesterol/HDL:CHD Risk Coronary Heart Disease Risk Table                     Men   Women  1/2 Average Risk   3.4   3.3  Average Risk       5.0   4.4  2 X Average Risk   9.6   7.1  3  X Average Risk  23.4   11.0        Use the calculated Patient Ratio above and the CHD Risk Table to determine the patient's CHD Risk.        ATP III CLASSIFICATION (LDL):  <100     mg/dL   Optimal  811-914  mg/dL   Near or Above                    Optimal  130-159  mg/dL   Borderline  782-956  mg/dL   High  >213     mg/dL   Very High 0/02/6577   LDLCALC 73 01/09/2010   Lab Results  Component Value Date   TRIG 75 10/19/2012   TRIG 163* 12/31/2010   TRIG 80 01/09/2010   Lab Results  Component Value Date   CHOLHDL 3.0 10/19/2012   CHOLHDL 3.2 12/31/2010   CHOLHDL 3.1 Ratio  01/09/2010   No results found for this basename: LDLDIRECT      Radiology:  Dg Chest 1 View  09/07/2013   CLINICAL DATA:  Increasing shortness of breath, being treated for infection  EXAM: CHEST - 1 VIEW  COMPARISON:  09/05/2013  FINDINGS: Status post CABG with mild stable cardiac enlargement. Moderate to severe vascular congestion. Decreased interstitial prominence. Small bilateral pleural effusions.  IMPRESSION: Stable vascular congestion with improving pulmonary edema.   Electronically Signed   By: Esperanza Heiraymond  Rubner M.D.   On: 09/07/2013 14:03    EKG:  ECG, nonspecific ST segment changes.  No obvious ischemia  ASSESSMENT: SHOB, abnormal troponin  PLAN:  Fluid overload due to CHF.  Either systolic or diastolic CHF.  Await echo results.  COntinue diuresis.  Anemia likely contributes as well.  Apparently, the patient had a stress test in the last year.  THe results are not available to me at this time. Normal EF in 2011.    ECG shows no obvious ischemia.  Would not heparinize as I think the troponin is from fluid overload rather than ACS.  Continue diuresis.    CAD: Continue secondary prevention including beta blocker and atorvastatin.  Corky CraftsVARANASI,JAYADEEP S., MD  09/08/2013  10:28 AM

## 2013-09-09 ENCOUNTER — Encounter (HOSPITAL_COMMUNITY): Payer: Self-pay | Admitting: Internal Medicine

## 2013-09-09 DIAGNOSIS — D509 Iron deficiency anemia, unspecified: Secondary | ICD-10-CM

## 2013-09-09 DIAGNOSIS — I509 Heart failure, unspecified: Secondary | ICD-10-CM

## 2013-09-09 DIAGNOSIS — N179 Acute kidney failure, unspecified: Secondary | ICD-10-CM

## 2013-09-09 DIAGNOSIS — R4182 Altered mental status, unspecified: Secondary | ICD-10-CM

## 2013-09-09 HISTORY — DX: Iron deficiency anemia, unspecified: D50.9

## 2013-09-09 LAB — CBC
HCT: 27.4 % — ABNORMAL LOW (ref 39.0–52.0)
Hemoglobin: 9.1 g/dL — ABNORMAL LOW (ref 13.0–17.0)
MCH: 28.8 pg (ref 26.0–34.0)
MCHC: 33.2 g/dL (ref 30.0–36.0)
MCV: 86.7 fL (ref 78.0–100.0)
Platelets: 164 10*3/uL (ref 150–400)
RBC: 3.16 MIL/uL — ABNORMAL LOW (ref 4.22–5.81)
RDW: 15.5 % (ref 11.5–15.5)
WBC: 4.1 10*3/uL (ref 4.0–10.5)

## 2013-09-09 LAB — BASIC METABOLIC PANEL
BUN: 28 mg/dL — ABNORMAL HIGH (ref 6–23)
CALCIUM: 8.4 mg/dL (ref 8.4–10.5)
CO2: 29 mEq/L (ref 19–32)
Chloride: 101 mEq/L (ref 96–112)
Creatinine, Ser: 1.42 mg/dL — ABNORMAL HIGH (ref 0.50–1.35)
GFR, EST AFRICAN AMERICAN: 56 mL/min — AB (ref >90)
GFR, EST NON AFRICAN AMERICAN: 49 mL/min — AB (ref >90)
Glucose, Bld: 85 mg/dL (ref 70–99)
Potassium: 4.1 mEq/L (ref 3.7–5.3)
SODIUM: 139 meq/L (ref 137–147)

## 2013-09-09 MED ORDER — WHITE PETROLATUM GEL
Status: AC
  Administered 2013-09-09: 19:00:00
  Filled 2013-09-09: qty 5

## 2013-09-09 MED ORDER — SODIUM CHLORIDE 0.9 % IV SOLN: 250.0000 mg | INTRAVENOUS | Status: DC

## 2013-09-09 NOTE — Progress Notes (Signed)
Subjective: Patient says breathing is better   No CP Objective: Filed Vitals:   09/08/13 2146 09/09/13 0134 09/09/13 0526 09/09/13 0755  BP: 139/55 143/66 129/47   Pulse: 56 63 58 61  Temp: 98 F (36.7 C) 98 F (36.7 C) 98.7 F (37.1 C)   TempSrc: Oral Oral Oral   Resp: 20 20 18    Height:      Weight:      SpO2: 99% 98% 98%    Weight change:   Intake/Output Summary (Last 24 hours) at 09/09/13 0819 Last data filed at 09/09/13 0800  Gross per 24 hour  Intake    220 ml  Output   5925 ml  Net  -5705 ml   I/O  (? Incomplete intake recording):  Net negative 12.4  L  General: Alert, awake, oriented x3, in no acute distress Neck:  JVP is normal Heart: Regular rate and rhythm, without murmurs, rubs, gallops.  Lungs: Crackles at bases. Exemities:  1+ edema.   Neuro: Grossly intact, nonfocal.   Lab Results: Results for orders placed during the hospital encounter of 09/05/13 (from the past 24 hour(s))  BASIC METABOLIC PANEL     Status: Abnormal   Collection Time    09/08/13  8:37 AM      Result Value Ref Range   Sodium 137  137 - 147 mEq/L   Potassium 3.9  3.7 - 5.3 mEq/L   Chloride 100  96 - 112 mEq/L   CO2 25  19 - 32 mEq/L   Glucose, Bld 116 (*) 70 - 99 mg/dL   BUN 29 (*) 6 - 23 mg/dL   Creatinine, Ser 1.611.35  0.50 - 1.35 mg/dL   Calcium 8.6  8.4 - 09.610.5 mg/dL   GFR calc non Af Amer 52 (*) >90 mL/min   GFR calc Af Amer 60 (*) >90 mL/min  CBC     Status: Abnormal   Collection Time    09/08/13  8:37 AM      Result Value Ref Range   WBC 7.1  4.0 - 10.5 K/uL   RBC 3.15 (*) 4.22 - 5.81 MIL/uL   Hemoglobin 9.1 (*) 13.0 - 17.0 g/dL   HCT 04.527.7 (*) 40.939.0 - 81.152.0 %   MCV 87.9  78.0 - 100.0 fL   MCH 28.9  26.0 - 34.0 pg   MCHC 32.9  30.0 - 36.0 g/dL   RDW 91.415.7 (*) 78.211.5 - 95.615.5 %   Platelets 151  150 - 400 K/uL  VITAMIN B12     Status: None   Collection Time    09/08/13  8:37 AM      Result Value Ref Range   Vitamin B-12 419  211 - 911 pg/mL  FOLATE     Status: None   Collection Time    09/08/13  8:37 AM      Result Value Ref Range   Folate 10.0    IRON AND TIBC     Status: Abnormal   Collection Time    09/08/13  8:37 AM      Result Value Ref Range   Iron 15 (*) 42 - 135 ug/dL   TIBC 213202 (*) 086215 - 578435 ug/dL   Saturation Ratios 7 (*) 20 - 55 %   UIBC 187  125 - 400 ug/dL  FERRITIN     Status: Abnormal   Collection Time    09/08/13  8:37 AM      Result Value Ref Range   Ferritin  11 (*) 22 - 322 ng/mL  RETICULOCYTES     Status: Abnormal   Collection Time    09/08/13  8:37 AM      Result Value Ref Range   Retic Ct Pct 1.5  0.4 - 3.1 %   RBC. 3.15 (*) 4.22 - 5.81 MIL/uL   Retic Count, Manual 47.3  19.0 - 186.0 K/uL  PRO B NATRIURETIC PEPTIDE     Status: Abnormal   Collection Time    09/08/13  9:18 AM      Result Value Ref Range   Pro B Natriuretic peptide (BNP) 33476.0 (*) 0 - 125 pg/mL  BASIC METABOLIC PANEL     Status: Abnormal   Collection Time    09/09/13  5:15 AM      Result Value Ref Range   Sodium 139  137 - 147 mEq/L   Potassium 4.1  3.7 - 5.3 mEq/L   Chloride 101  96 - 112 mEq/L   CO2 29  19 - 32 mEq/L   Glucose, Bld 85  70 - 99 mg/dL   BUN 28 (*) 6 - 23 mg/dL   Creatinine, Ser 9.60 (*) 0.50 - 1.35 mg/dL   Calcium 8.4  8.4 - 45.4 mg/dL   GFR calc non Af Amer 49 (*) >90 mL/min   GFR calc Af Amer 56 (*) >90 mL/min    Studies/Results: @RISRSLT24 @  Medications: Reviewed   @PROBHOSP @  1.  CHF  Combined systolic/diastolic  Acute.  Echo yesterday showes LVEF approx 45% with new hypokinesis of anteroseptum.  Mild MR.   I would continue current diuresis. BNP is signif increased Watch BUN/CR in AM 2  CAD  No symtoms to sugg active ischemia  3.  HL  Statin.    LOS: 4 days   Dietrich Pates 09/09/2013, 8:19 AM

## 2013-09-09 NOTE — Progress Notes (Signed)
Patient ID: [name], male   DOB: 06/25/1941, 73 y.o.   MRN: [id number] [name], to be seen by cariologist aftre echo. Still with diuresis. Spoke wth his son. It seems tht [name] is retaining urine because he is unable to selfcath secondary to some obstruction. Consult with GU done. Continue as per ID

## 2013-09-09 NOTE — Progress Notes (Signed)
[facility] CONSULT NOTE/ PROGRESS NOTE  [name] UJW:[id number] DOB: [birth year] DOA: 09/05/2013 PCP: Fredirick Maudlin, MD  Assessment/Plan: #1 acute combined systolic/diastolic CHF exacerbation Clinical improvement. Cardiac enzymes elevated. 2-D echo is pending. Pro BNP is elevated. I/O = -12.454 L. Current weight is 90 kg. Continue IV Lasix. Elevated troponins felt to be secondary to acute CHF exacerbation per cardiology. Patient with no active chest pain. 2-D echo with a 45% with new hypokinesis of the anterioroseptum. Continue IV Lasix. Cardiology following and appreciate input and recommendations.  #2 dyspnea Secondary to problem #1. Improved.  #3 fever/chills Likely secondary to Pseudomonas and/or Escherichia coli UTI related to bladder outlet obstruction or I D. Patient currently on IV cefepime. Per ID.  #4. Pseudomonas and Escherichia coli UTI On IV cefepime. Per infectious diseases.  #5 elevated troponins May be secondary to problem #1. Patient currently chest pain-free. 2-D echo with EF of 45% with new hypokinesis of the anterioroseptum and mild mitral regurgitation. Continue aspirin, Lipitor, Coreg, IV Lasix. Follow.  #6 probable soft tissue abscess in the lumbar region Per primary team. Continue current IV antibiotics.  #7 iron deficiency anemia Hemoglobin currently stable at 9.1. Anemia panel with iron level of 15 and a ferritin of 11. If patient has not had a colonoscopy done will likely benefit from one as outpatient. Will place on s IV iron while inpatient. Follow.  Code Status: Full Family Communication: Updated patient no family present. Disposition Plan: Per primary team   Consultants: [facility] : Dr. Lendell Caprice 09/07/2013  Infectious diseases: Dr. Orvan Falconer 09/06/2013  Cardiology Dr. Eldridge Dace 09/08/2013  Procedures:  Chest x-ray 09/07/2013, 09/05/2013  MRI of the L-spine 09/05/2013  2-D echo 09/08/13  Antibiotics:  IV cefepime  09/06/2013  IV Rocephin t2/9/15-->09/05/13  Oral Bactrim 09/05/2013 --> 09/06/2013  IV vancomycin 09/05/2013----> 09/05/2013  HPI/Subjective: Patient states his breathing has improved. No complaints.  Objective: Filed Vitals:   09/09/13 0958  BP: 137/53  Pulse: 57  Temp: 98.2 F (36.8 C)  Resp: 20    Intake/Output Summary (Last 24 hours) at 09/09/13 1354 Last data filed at 09/09/13 1200  Gross per 24 hour  Intake    480 ml  Output   5291 ml  Net  -4811 ml   Filed Weights   09/05/13 1946  Weight: 90 kg (198 lb 6.6 oz)    Exam:   General:  NAD  Cardiovascular: Regular rate rhythm no murmurs rubs or gallops.  Respiratory: Bibasilar crackles  Abdomen: Soft, nontender, nondistended, positive bowel sounds  Musculoskeletal: No clubbing or cyanosis. 1-2+ bilateral lower extremity edema  Data Reviewed: Basic Metabolic Panel:  Recent Labs Lab 09/05/13 1252 09/07/13 1547 09/08/13 0837 09/09/13 0515  NA 136* 136* 137 139  K 4.1 4.5 3.9 4.1  CL 97 100 100 101  CO2 26 23 25 29   GLUCOSE 115* 136* 116* 85  BUN 13 26* 29* 28*  CREATININE 1.80* 1.34 1.35 1.42*  CALCIUM 8.6 8.8 8.6 8.4   Liver Function Tests:  Recent Labs Lab 09/05/13 1252  AST 17  ALT 11  ALKPHOS 57  BILITOT 0.3  PROT 5.4*  ALBUMIN 2.9*   No results found for this basename: LIPASE, AMYLASE,  in the last 168 hours No results found for this basename: AMMONIA,  in the last 168 hours CBC:  Recent Labs Lab 09/05/13 1252 09/07/13 1547 09/08/13 0837 09/09/13 0850  WBC 11.6* 9.0 7.1 4.1  NEUTROABS 9.8* 8.1*  --   --   HGB 8.7*  8.4* 9.1* 9.1*  HCT 25.9* 24.9* 27.7* 27.4*  MCV 88.4 87.7 87.9 86.7  PLT 141* 129* 151 164   Cardiac Enzymes:  Recent Labs Lab 09/07/13 1547 09/07/13 1959 09/08/13 0448  TROPONINI 0.56* 0.55* 0.51*   BNP (last 3 results)  Recent Labs  08/30/13 2105 09/07/13 1547 09/08/13 0918  PROBNP 738.4* 23778.0* 33476.0*   CBG: No results found for this  basename: GLUCAP,  in the last 168 hours  Recent Results (from the past 240 hour(s))  URINE CULTURE     Status: None   Collection Time    09/02/13  9:51 AM      Result Value Ref Range Status   Specimen Description URINE, CATHETERIZED   Final   Special Requests NONE   Final   Culture  Setup Time     Final   Value: 09/03/2013 00:31     Performed at [facility]   Colony Count     Final   Value: >=100,000 COLONIES/ML     Performed at [facility]   Culture     Final   Value: PSEUDOMONAS AERUGINOSA     Performed at [facility]   Report Status 09/04/2013 FINAL   Final   Organism ID, Bacteria PSEUDOMONAS AERUGINOSA   Final  URINE CULTURE     Status: None   Collection Time    09/05/13  1:35 PM      Result Value Ref Range Status   Specimen Description URINE, CATHETERIZED   Final   Special Requests NONE   Final   Culture  Setup Time     Final   Value: 09/05/2013 21:02     Performed at [facility]   Colony Count     Final   Value: >=100,000 COLONIES/ML     Performed at [facility]   Culture     Final   Value: ESCHERICHIA COLI     Performed at [facility]   Report Status 09/07/2013 FINAL   Final   Organism ID, Bacteria ESCHERICHIA COLI   Final  CULTURE, BLOOD (ROUTINE X 2)     Status: None   Collection Time    09/05/13 11:15 PM      Result Value Ref Range Status   Specimen Description BLOOD RIGHT ANTECUBITAL   Final   Special Requests BOTTLES DRAWN AEROBIC ONLY 10CC   Final   Culture  Setup Time     Final   Value: 09/06/2013 03:28     Performed at [facility]   Culture     Final   Value:        BLOOD CULTURE RECEIVED NO GROWTH TO DATE CULTURE WILL BE HELD FOR 5 DAYS BEFORE ISSUING A FINAL NEGATIVE REPORT     Performed at [facility]   Report Status PENDING   Incomplete  CULTURE, BLOOD (ROUTINE X 2)     Status: None   Collection Time    09/05/13 11:20 PM      Result Value Ref Range Status   Specimen  Description BLOOD RIGHT HAND   Final   Special Requests BOTTLES DRAWN AEROBIC ONLY 10CC   Final   Culture  Setup Time     Final   Value: 09/06/2013 03:28     Performed at [facility]   Culture     Final   Value:        BLOOD CULTURE RECEIVED NO GROWTH TO DATE CULTURE WILL BE  HELD FOR 5 DAYS BEFORE ISSUING A FINAL NEGATIVE REPORT     Performed at [facility]   Report Status PENDING   Incomplete  MRSA PCR SCREENING     Status: None   Collection Time    09/05/13 11:50 PM      Result Value Ref Range Status   MRSA by PCR NEGATIVE  NEGATIVE Final   Comment:            The GeneXpert MRSA Assay (FDA     approved for NASAL specimens     only), is one component of a     comprehensive MRSA colonization     surveillance program. It is not     intended to diagnose MRSA     infection nor to guide or     monitor treatment for     MRSA infections.     Studies: Dg Chest 1 View  09/07/2013   CLINICAL DATA:  Increasing shortness of breath, being treated for infection  EXAM: CHEST - 1 VIEW  COMPARISON:  09/05/2013  FINDINGS: Status post CABG with mild stable cardiac enlargement. Moderate to severe vascular congestion. Decreased interstitial prominence. Small bilateral pleural effusions.  IMPRESSION: Stable vascular congestion with improving pulmonary edema.   Electronically Signed   By: Esperanza Heir M.D.   On: 09/07/2013 14:03    Scheduled Meds: . aspirin EC  81 mg Oral Daily  . atorvastatin  40 mg Oral Daily  . carvedilol  6.25 mg Oral BID WC  . ceFEPime (MAXIPIME) IV  1 g Intravenous Q12H  . citalopram  40 mg Oral Daily  . colchicine  0.6 mg Oral BID  . doxazosin  8 mg Oral QHS  . enoxaparin (LOVENOX) injection  90 mg Subcutaneous Q12H  . furosemide  40 mg Intravenous BID  . gabapentin  600 mg Oral TID  . OxyCODONE  20 mg Oral Q12H  . predniSONE  10 mg Oral Q breakfast  . tamsulosin  0.4 mg Oral QPC breakfast  . vitamin C  500 mg Oral Daily   Continuous Infusions:    Principal Problem:   Fever and chills Active Problems:   Acute CHF (congestive heart failure)   HYPERTENSION   CAD in native artery   Lumbar degenerative disc disease   Cauda equina syndrome with neurogenic bladder   Altered mental status   Dyspnea   UTI (urinary tract infection)   Elevated troponin   Anemia, iron deficiency    Time spent: 40 mins    [facility] MD [facility] Pager [phone]. If 7PM-7AM, please contact night-coverage at [email or web address], password [facility] 09/09/2013, 1:54 PM  LOS: 4 days

## 2013-09-09 NOTE — Progress Notes (Signed)
Patient ID: [name], male   DOB: 03/07/1941, 73 y.o.   MRN: [id number]         [facility]    Date of Admission:  09/05/2013    Total days of antibiotics 42        Day 4 cefepime         Principal Problem:   Fever and chills Active Problems:   Lumbar degenerative disc disease   Cauda equina syndrome with neurogenic bladder   HYPERTENSION   CAD in native artery   Altered mental status   Dyspnea   UTI (urinary tract infection)   Acute CHF (congestive heart failure)   Elevated troponin   Anemia, iron deficiency   . aspirin EC  81 mg Oral Daily  . atorvastatin  40 mg Oral Daily  . carvedilol  6.25 mg Oral BID WC  . ceFEPime (MAXIPIME) IV  1 g Intravenous Q12H  . citalopram  40 mg Oral Daily  . colchicine  0.6 mg Oral BID  . doxazosin  8 mg Oral QHS  . enoxaparin (LOVENOX) injection  90 mg Subcutaneous Q12H  . furosemide  40 mg Intravenous BID  . gabapentin  600 mg Oral TID  . OxyCODONE  20 mg Oral Q12H  . predniSONE  10 mg Oral Q breakfast  . tamsulosin  0.4 mg Oral QPC breakfast  . vitamin C  500 mg Oral Daily    Subjective: He is feeling better. He is having minimal back pain.  Past Medical History  Diagnosis Date  . Hyperlipidemia   . Small bowel problem     HAD ALOT OF SCAR TISSUE FROM PREVIOUS SURGERIES..NG WAS INSERTED ...Marland Kitchen.Marland Kitchen.NO SURGERY NEEDED...Marland Kitchen.Marland Kitchen.IN FOR 8 DAYS  . Disorder of blood     BEEN TREATED BY DERMATOLOGIST X 4 YRS..."BLOOD BLISTERS"  . Arthritis   . Gout   . Anxiety   . Hx MRSA infection     rt shoulder  . Pneumonia     "I've had it 3-4 times"  . Coronary artery disease     IN 2000   STENT PLACED IN 2012 sees Dr. Dietrich Patesothbart, saw last 2013  . HTN (hypertension)     [place] Dr. [place]  . Small bowel obstruction   . Ventral hernia   . Acute renal failure 04/17/2013  . 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 Anemia, iron deficiency 09/09/2013    History  Substance Use Topics  .  Smoking status: Former Smoker -- 1.00 packs/day for 1 years    Types: Cigarettes    Quit date: 12/27/1998  . Smokeless tobacco: Never Used  . Alcohol Use: No    Family History  Problem Relation Age of Onset  . Heart disease    . Hypertension Mother   . Hypertension Maternal Aunt   . Hypertension Maternal Uncle   . Anesthesia problems Neg Hx   . Hypotension Neg Hx   . Malignant hyperthermia Neg Hx   . Pseudochol deficiency Neg Hx     Allergies  Allergen Reactions  . Morphine Other (See Comments)    REACTION: made him go crazy; "I had fun w/it; my family didn't think it was too funny"  . Metoprolol Other (See Comments)    REACTION:  Tachycardia; "don't remember how bad it was; it was so long ago"  . Rifampin     Itch     Objective: Temp:  [97.6 F (36.4 C)-98.7  F (37.1 C)] 98.2 F (36.8 C) (02/13 0958) Pulse Rate:  [56-63] 57 (02/13 0958) Resp:  [18-20] 20 (02/13 0958) BP: (113-143)/(47-66) 137/53 mmHg (02/13 0958) SpO2:  [94 %-99 %] 96 % (02/13 0958)  General: He is in good spirits Skin: The right arm PICC  Lab Results Lab Results  Component Value Date   WBC 4.1 09/09/2013   HGB 9.1* 09/09/2013   HCT 27.4* 09/09/2013   MCV 86.7 09/09/2013   PLT 164 09/09/2013    Lab Results  Component Value Date   CREATININE 1.42* 09/09/2013   BUN 28* 09/09/2013   NA 139 09/09/2013   K 4.1 09/09/2013   CL 101 09/09/2013   CO2 29 09/09/2013    Lab Results  Component Value Date   ALT 11 09/05/2013   AST 17 09/05/2013   ALKPHOS 57 09/05/2013   BILITOT 0.3 09/05/2013      Microbiology: Recent Results (from the past 240 hour(s))  URINE CULTURE     Status: None   Collection Time    09/02/13  9:51 AM      Result Value Ref Range Status   Specimen Description URINE, CATHETERIZED   Final   Special Requests NONE   Final   Culture  Setup Time     Final   Value: 09/03/2013 00:31     Performed at [facility] Count     Final   Value: >=100,000 COLONIES/ML      Performed at [facility]   Culture     Final   Value: PSEUDOMONAS AERUGINOSA     Performed at [facility]   Report Status 09/04/2013 FINAL   Final   Organism ID, Bacteria PSEUDOMONAS AERUGINOSA   Final  URINE CULTURE     Status: None   Collection Time    09/05/13  1:35 PM      Result Value Ref Range Status   Specimen Description URINE, CATHETERIZED   Final   Special Requests NONE   Final   Culture  Setup Time     Final   Value: 09/05/2013 21:02     Performed at [facility] Count     Final   Value: >=100,000 COLONIES/ML     Performed at [facility]   Culture     Final   Value: ESCHERICHIA COLI     Performed at [facility]   Report Status 09/07/2013 FINAL   Final   Organism ID, Bacteria ESCHERICHIA COLI   Final  CULTURE, BLOOD (ROUTINE X 2)     Status: None   Collection Time    09/05/13 11:15 PM      Result Value Ref Range Status   Specimen Description BLOOD RIGHT ANTECUBITAL   Final   Special Requests BOTTLES DRAWN AEROBIC ONLY 10CC   Final   Culture  Setup Time     Final   Value: 09/06/2013 03:28     Performed at [facility]   Culture     Final   Value:        BLOOD CULTURE RECEIVED NO GROWTH TO DATE CULTURE WILL BE HELD FOR 5 DAYS BEFORE ISSUING A FINAL NEGATIVE REPORT     Performed at [facility]   Report Status PENDING   Incomplete  CULTURE, BLOOD (ROUTINE X 2)     Status: None   Collection Time    09/05/13 11:20 PM      Result Value  Ref Range Status   Specimen Description BLOOD RIGHT HAND   Final   Special Requests BOTTLES DRAWN AEROBIC ONLY 10CC   Final   Culture  Setup Time     Final   Value: 09/06/2013 03:28     Performed at [facility]   Culture     Final   Value:        BLOOD CULTURE RECEIVED NO GROWTH TO DATE CULTURE WILL BE HELD FOR 5 DAYS BEFORE ISSUING A FINAL NEGATIVE REPORT     Performed at [facility]   Report Status PENDING   Incomplete  MRSA PCR SCREENING      Status: None   Collection Time    09/05/13 11:50 PM      Result Value Ref Range Status   MRSA by PCR NEGATIVE  NEGATIVE Final   Comment:            The GeneXpert MRSA Assay (FDA     approved for NASAL specimens     only), is one component of a     comprehensive MRSA colonization     surveillance program. It is not     intended to diagnose MRSA     infection nor to guide or     monitor treatment for     MRSA infections.    Studies/Results: Dg Chest 1 View  09/07/2013   CLINICAL DATA:  Increasing shortness of breath, being treated for infection  EXAM: CHEST - 1 VIEW  COMPARISON:  09/05/2013  FINDINGS: Status post CABG with mild stable cardiac enlargement. Moderate to severe vascular congestion. Decreased interstitial prominence. Small bilateral pleural effusions.  IMPRESSION: Stable vascular congestion with improving pulmonary edema.   Electronically Signed   By: Esperanza Heir M.D.   On: 09/07/2013 14:03    Assessment: He is improved on therapy for Pseudomonas and Escherichia coli UTI. I will plan on 10 more days of IV cefepime as an outpatient.  Plan: 1. Continue cefepime through February 23. 2. I will arrange followup in our clinic  Cliffton Asters, MD [facility] [phone] pager   [phone] cell 09/09/2013, 11:29 AM

## 2013-09-09 NOTE — Progress Notes (Signed)
UR complete.  Saleena Tamas RN, MSN 

## 2013-09-09 NOTE — Care Management Note (Unsigned)
    Page 1 of 2   09/09/2013     11:37:15 AM   CARE MANAGEMENT NOTE 09/09/2013  Patient:  Jake MarvelHAMILTON,Jake W   Account Number:  [id number]  Date Initiated:  09/08/2013  Documentation initiated by:  Elmer BalesOBARGE,COURTNEY  Subjective/Objective Assessment:   Patient admitted for fever.  Lives at home, [facility]/RN prior to admit.     Action/Plan:   Will follow for discharge needs.  Anticipate IV ATB at discharge.   Anticipated DC Date:     Anticipated DC Plan:  HOME W HOME HEALTH SERVICES      DC Planning Services  CM consult      Choice offered to / List presented to:  C-1 Patient        HH arranged  HH-1 RN  HH-2 PT  HH[facility]3 OT      [facility]   Status of service:  Completed, signed off Medicare Important Message given?   (If response is "NO", the following Medicare IM given date fields will be blank) Date Medicare IM given:   Date Additional Medicare IM given:    Discharge Disposition:    Per UR Regulation:    If discussed at Long Length of Stay Meetings, dates discussed:    Comments:  09/09/13 1130 Elmer Balesourtney Robarge RN, MSN, CM- CM spoke with Dr [name] regarding home health orders.  Orders were recieved.  CM spoke with patient and son, who are agreeable to assuming care with [facility].  [name] was notified that orders were on the chart, but discharge date has yet to be determined.   09/08/13 Elmer Balesourtney Robarge RN, MSN, CM- Message left in chart requesting specific IV ATB orders as well as resumption of previous HH orders at discharge.  [name] was notified that patient will likely be discharging home with IV ATB when medically stable.

## 2013-09-09 NOTE — Consult Note (Signed)
Urology Consult   Physician requesting consult: Botero  Reason for consult: urinary retention  History of Present Illness: [name] is a 73 y.o. male with a history of spinal stenosis and degenerative disc status post lumbar surgery in November complicated by cauda equina syndrome and CSF leak.  He was readmitted in January with back pain.  An MRI showed fluid in the operative bed.  He was treated inpatient for the infection and sent home with oral SMZ TMP.  This series of events led him to have urinary retention.  He was placed on CIC q 4 hours.  Prior to this he voided every few hours with good flow and had nocturia x 1-3.  He is on daily doxazosin and tamsulosin.  He has been seen at [facility] by Dr. Sherron Monday in January who agreed with his current GU regimen.  Apparently, his primary physician, Dr. Jeral Fruit, was unaware of this appointment as he called for the consult today.  Since [name] was seen by Dr. Sherron Monday he has been in and out of the [facility] ER for retention due to difficulty catheterizing at home.  He has had residuals of 2200 cc of urine when presenting to the ER.    Earlier this week he presented to the ER with fever and chills thought to be related to a UTI.  A urine culture on 09/05/13 showed an e. Coli infection.  He is on IV maxipime per culture results. He is currently comfortable in bed in no acute distress.  He denies any pain, nausea, vomiting, fever, or chills.  He is tolerating CIC every 4 hours and notes no pain with catheterization.  He refuses an indwelling catheter.    Past Medical History  Diagnosis Date  . Hyperlipidemia   . Small bowel problem     HAD ALOT OF SCAR TISSUE FROM PREVIOUS SURGERIES..NG WAS INSERTED ...Marland KitchenMarland KitchenNO SURGERY NEEDED...Marland KitchenMarland KitchenIN FOR 8 DAYS  . Disorder of blood     BEEN TREATED BY DERMATOLOGIST X 4 YRS..."BLOOD BLISTERS"  . Arthritis   . Gout   . Anxiety   . Hx MRSA infection     rt shoulder  . Pneumonia     "I've had it  3-4 times"  . Coronary artery disease     IN 2000   STENT PLACED IN 2012 sees Dr. Dietrich Pates, saw last 2013  . HTN (hypertension)     sees [name] in [place]  . Small bowel obstruction   . Ventral hernia   . Acute renal failure 04/17/2013  . 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 Anemia, iron deficiency 09/09/2013    Past Surgical History  Procedure Laterality Date  . Sternal surg  2000    HAS HAD 5-6 ON HIS STERNUM; "caught MRSA in it"  . Lumbar laminectomy/decompression microdiscectomy  06/13/2011    Procedure: LUMBAR LAMINECTOMY/DECOMPRESSION MICRODISCECTOMY;  Surgeon: Karn Cassis;  Location: [facility];  Service: Neurosurgery;  Laterality: N/A;  Lumbar three, lumbar four-five Laminectomy  . Eye surgery      bilateral cataract  . Anterior cervical corpectomy  12/17/11  . Incision and drainage of wound  ~ 20ll; 12/03/11    "had infection in my right"  . Shoulder open rotator cuff repair  ~ 2011    right  . Peripherally inserted central catheter insertion  2011 & 11/2011  . Cataract extraction w/ intraocular lens  implant, bilateral  ? 2011  . Coronary artery bypass  graft  2000    CABG X5  . Back surgery      lumbar  . Tonsillectomy  1949  . Cholecystectomy  2006 "or after"  . Coronary angioplasty with stent placement  2012  . Anterior cervical corpectomy  12/17/2011    Procedure: ANTERIOR CERVICAL CORPECTOMY;  Surgeon: Karn Cassis, MD;  Location: [facility];  Service: Neurosurgery;  Laterality: N/A;  Anterior Cervical Decompression Fusion Five to Thoracic Two with plating  . Lumbar laminectomy/decompression microdiscectomy Right 06/17/2013    Procedure: LUMBAR LAMINECTOMY/DECOMPRESSION MICRODISCECTOMY 1 LEVEL;  Surgeon: Karn Cassis, MD;  Location: [facility];  Service: Neurosurgery;  Laterality: Right;  Right L3-4 Microdiskectomy  . Lumbar wound debridement N/A 08/02/2013    Procedure: INCISION AND DRAINAGE OF LUMBAR WOUND DEBRIDEMENT;   Surgeon: Karn Cassis, MD;  Location: [facility];  Service: Neurosurgery;  Laterality: N/A;    Current Hospital Medications:  Home Meds:    Medication List    ASK your doctor about these medications       acetaminophen 325 MG tablet  Commonly known as:  TYLENOL  Take 1-2 tablets (325-650 mg total) by mouth every 4 (four) hours as needed for mild pain.     atorvastatin 40 MG tablet  Commonly known as:  LIPITOR  Take 1 tablet (40 mg total) by mouth daily.     camphor-menthol lotion  Commonly known as:  SARNA  Apply topically 3 (three) times daily after meals.     citalopram 40 MG tablet  Commonly known as:  CELEXA  Take 40 mg by mouth daily.     colchicine 0.6 MG tablet  Take 1 tablet (0.6 mg total) by mouth 2 (two) times daily.     doxazosin 4 MG tablet  Commonly known as:  CARDURA  Take 8 mg by mouth at bedtime.     gabapentin 300 MG capsule  Commonly known as:  NEURONTIN  Take 2 capsules (600 mg total) by mouth 3 (three) times daily.     methocarbamol 500 MG tablet  Commonly known as:  ROBAXIN  Take 1 tablet (500 mg total) by mouth 4 (four) times daily as needed for muscle spasms.     nitroGLYCERIN 0.4 MG SL tablet  Commonly known as:  NITROSTAT  Place 0.4 mg under the tongue every 5 (five) minutes as needed. For chest pain     OxyCODONE 20 mg T12a 12 hr tablet  Commonly known as:  OXYCONTIN  Take 1 tablet (20 mg total) by mouth every 12 (twelve) hours.     predniSONE 5 MG tablet  Commonly known as:  DELTASONE  Take 5 mg by mouth daily with breakfast.     sulfamethoxazole-trimethoprim 800-160 MG per tablet  Commonly known as:  BACTRIM DS  Take 1 tablet by mouth every 12 (twelve) hours.     tamsulosin 0.4 MG Caps capsule  Commonly known as:  FLOMAX  Take 1 capsule (0.4 mg total) by mouth daily after breakfast.     VITAMIN B 12 PO  Take 1 tablet by mouth daily.     vitamin C 500 MG tablet  Commonly known as:  ASCORBIC ACID  Take 500 mg by mouth  daily.     VITAMIN D (CHOLECALCIFEROL) PO  Take 2-3 tablets by mouth daily.        Scheduled Meds: . aspirin EC  81 mg Oral Daily  . atorvastatin  40 mg Oral Daily  . carvedilol  6.25  mg Oral BID WC  . ceFEPime (MAXIPIME) IV  1 g Intravenous Q12H  . citalopram  40 mg Oral Daily  . colchicine  0.6 mg Oral BID  . doxazosin  8 mg Oral QHS  . enoxaparin (LOVENOX) injection  90 mg Subcutaneous Q12H  . furosemide  40 mg Intravenous BID  . gabapentin  600 mg Oral TID  . OxyCODONE  20 mg Oral Q12H  . predniSONE  10 mg Oral Q breakfast  . tamsulosin  0.4 mg Oral QPC breakfast  . vitamin C  500 mg Oral Daily   Continuous Infusions:  PRN Meds:.acetaminophen, methocarbamol, nitroGLYCERIN, sodium chloride  Allergies:  Allergies  Allergen Reactions  . Morphine Other (See Comments)    REACTION: made him go crazy; "I had fun w/it; my family didn't think it was too funny"  . Metoprolol Other (See Comments)    REACTION:  Tachycardia; "don't remember how bad it was; it was so long ago"  . Rifampin     Itch     Family History  Problem Relation Age of Onset  . Heart disease    . Hypertension Mother   . Hypertension Maternal Aunt   . Hypertension Maternal Uncle   . Anesthesia problems Neg Hx   . Hypotension Neg Hx   . Malignant hyperthermia Neg Hx   . Pseudochol deficiency Neg Hx     Social History:  reports that he quit smoking about 14 years ago. His smoking use included Cigarettes. He has a 1 pack-year smoking history. He has never used smokeless tobacco. He reports that he does not drink alcohol or use illicit drugs.  ROS: A complete review of systems was performed.  All systems are negative except for pertinent findings as noted.  Physical Exam:  Vital signs in last 24 hours: Temp:  [97.6 F (36.4 C)-98.7 F (37.1 C)] 98.2 F (36.8 C) (02/13 0958) Pulse Rate:  [56-63] 57 (02/13 0958) Resp:  [18-20] 20 (02/13 0958) BP: (113-143)/(47-66) 137/53 mmHg (02/13 0958) SpO2:   [96 %-99 %] 96 % (02/13 0958) Constitutional:  Alert and oriented, No acute distress Cardiovascular: Regular rate  Respiratory: Normal respiratory effort GI: Abdomen is soft, nontender, nondistended GU: No CVA tenderness Psychiatric: Normal mood and affect  Laboratory Data:   Recent Labs  09/07/13 1547 09/08/13 0837 09/09/13 0850  WBC 9.0 7.1 4.1  HGB 8.4* 9.1* 9.1*  HCT 24.9* 27.7* 27.4*  PLT 129* 151 164     Recent Labs  09/07/13 1547 09/08/13 0837 09/09/13 0515  NA 136* 137 139  K 4.5 3.9 4.1  CL 100 100 101  GLUCOSE 136* 116* 85  BUN 26* 29* 28*  CALCIUM 8.8 8.6 8.4  CREATININE 1.34 1.35 1.42*     Results for orders placed during the hospital encounter of 09/05/13 (from the past 24 hour(s))  BASIC METABOLIC PANEL     Status: Abnormal   Collection Time    09/09/13  5:15 AM      Result Value Ref Range   Sodium 139  137 - 147 mEq/L   Potassium 4.1  3.7 - 5.3 mEq/L   Chloride 101  96 - 112 mEq/L   CO2 29  19 - 32 mEq/L   Glucose, Bld 85  70 - 99 mg/dL   BUN 28 (*) 6 - 23 mg/dL   Creatinine, Ser 0.98 (*) 0.50 - 1.35 mg/dL   Calcium 8.4  8.4 - 11.9 mg/dL   GFR calc non Af Amer 49 (*) >  90 mL/min   GFR calc Af Amer 56 (*) >90 mL/min  CBC     Status: Abnormal   Collection Time    09/09/13  8:50 AM      Result Value Ref Range   WBC 4.1  4.0 - 10.5 K/uL   RBC 3.16 (*) 4.22 - 5.81 MIL/uL   Hemoglobin 9.1 (*) 13.0 - 17.0 g/dL   HCT 16.1 (*) 09.6 - 04.5 %   MCV 86.7  78.0 - 100.0 fL   MCH 28.8  26.0 - 34.0 pg   MCHC 33.2  30.0 - 36.0 g/dL   RDW 40.9  81.1 - 91.4 %   Platelets 164  150 - 400 K/uL   Recent Results (from the past 240 hour(s))  URINE CULTURE     Status: None   Collection Time    09/02/13  9:51 AM      Result Value Ref Range Status   Specimen Description URINE, CATHETERIZED   Final   Special Requests NONE   Final   Culture  Setup Time     Final   Value: 09/03/2013 00:31     Performed at [facility] Count     Final    Value: >=100,000 COLONIES/ML     Performed at [facility]   Culture     Final   Value: PSEUDOMONAS AERUGINOSA     Performed at [facility]   Report Status 09/04/2013 FINAL   Final   Organism ID, Bacteria PSEUDOMONAS AERUGINOSA   Final  URINE CULTURE     Status: None   Collection Time    09/05/13  1:35 PM      Result Value Ref Range Status   Specimen Description URINE, CATHETERIZED   Final   Special Requests NONE   Final   Culture  Setup Time     Final   Value: 09/05/2013 21:02     Performed at [facility] Count     Final   Value: >=100,000 COLONIES/ML     Performed at [facility]   Culture     Final   Value: ESCHERICHIA COLI     Performed at [facility]   Report Status 09/07/2013 FINAL   Final   Organism ID, Bacteria ESCHERICHIA COLI   Final  CULTURE, BLOOD (ROUTINE X 2)     Status: None   Collection Time    09/05/13 11:15 PM      Result Value Ref Range Status   Specimen Description BLOOD RIGHT ANTECUBITAL   Final   Special Requests BOTTLES DRAWN AEROBIC ONLY 10CC   Final   Culture  Setup Time     Final   Value: 09/06/2013 03:28     Performed at [facility]   Culture     Final   Value:        BLOOD CULTURE RECEIVED NO GROWTH TO DATE CULTURE WILL BE HELD FOR 5 DAYS BEFORE ISSUING A FINAL NEGATIVE REPORT     Performed at [facility]   Report Status PENDING   Incomplete  CULTURE, BLOOD (ROUTINE X 2)     Status: None   Collection Time    09/05/13 11:20 PM      Result Value Ref Range Status   Specimen Description BLOOD RIGHT HAND   Final   Special Requests BOTTLES DRAWN AEROBIC ONLY 10CC   Final   Culture  Setup Time  Final   Value: 09/06/2013 03:28     [facility]   Culture     Final   Value:        BLOOD CULTURE RECEIVED NO GROWTH TO DATE CULTURE WILL BE HELD FOR 5 DAYS BEFORE ISSUING A FINAL NEGATIVE REPORT     [facility] at [facility]   Report Status PENDING    Incomplete  MRSA PCR SCREENING     Status: None   Collection Time    09/05/13 11:50 PM      Result Value Ref Range Status   MRSA by PCR NEGATIVE  NEGATIVE Final   Comment:            The GeneXpert MRSA Assay (FDA     approved for NASAL specimens     only), is one component of a     comprehensive MRSA colonization     surveillance program. It is not     intended to diagnose MRSA     infection nor to guide or     monitor treatment for     MRSA infections.    Renal Function:  Recent Labs  09/05/13 1252 09/07/13 1547 09/08/13 0837 09/09/13 0515  CREATININE 1.80* 1.34 1.35 1.42*   Estimated Creatinine Clearance: 53.1 ml/min (by C-G formula based on Cr of 1.42).   Impression/Recommendation  [name] has urinary retention.  He is currently managing his retention with CIC q 4 hours and as needed.  Apparently he is having difficulty with this per Dr. Jeral FruitBotero and per his ER visits, but he reports that he personally reports that he is not having any difficulty.  He said when he initially learned CIC in the hospital, he was surprised upon going home that he was missing supplies.  This led him to have difficulty.  He says of late he has had the appropriate supplies and has been performing CIC adequately.  His fever and chills prompted him to proceed to the ER this most recent time.   After discussing the situation with [name] and Dr. Jeral FruitBotero, we have decided [name] will continue on CIC q 4 hours and PRN as he refuses indwelling catheterization.  This is reasonable.  The nurse is going to let him perform this independently but with supervision for the next few days (Dr. Jeral FruitBotero states he will be discharged perhaps on Tuesday 09/13/13).  This will allow for patient education if he is noted to not be performing CIC adequately.  Upon discharge if Dr. Jeral FruitBotero will decide if [name] is able to perform CIC independently at home and thus can be discharged without an indwelling catheter.   At this point we can continue to see [name] on an out patient basis.  I believe he is due to see Dr. Sherron MondayMacDiarmid in April.  Denna HaggardOASTER, CARY 09/09/2013, 4:09 PM   I have reviewed the history and physical exam and spoken with the patient about the plan to continue with CIC.   If there are problems we can always revert to a foley but I see no reason not to continue the CIC at this time.

## 2013-09-09 NOTE — Progress Notes (Signed)
Came to visit patient to offer and explain [facility]. Explained in detail [facility]. However, patient declined. States he manages fine at home. He accepted brochure and states he will call if he changes his mind in the future. Will make inpatient RNCM aware.  Raiford NobleAtika Hall, MSN, RN,BSN- [facility] Hospital [phone]

## 2013-09-10 DIAGNOSIS — I5041 Acute combined systolic (congestive) and diastolic (congestive) heart failure: Secondary | ICD-10-CM

## 2013-09-10 DIAGNOSIS — I25810 Atherosclerosis of coronary artery bypass graft(s) without angina pectoris: Secondary | ICD-10-CM

## 2013-09-10 DIAGNOSIS — D509 Iron deficiency anemia, unspecified: Secondary | ICD-10-CM

## 2013-09-10 DIAGNOSIS — I519 Heart disease, unspecified: Secondary | ICD-10-CM

## 2013-09-10 DIAGNOSIS — E785 Hyperlipidemia, unspecified: Secondary | ICD-10-CM

## 2013-09-10 DIAGNOSIS — N390 Urinary tract infection, site not specified: Secondary | ICD-10-CM

## 2013-09-10 LAB — BASIC METABOLIC PANEL
BUN: 24 mg/dL — AB (ref 6–23)
CO2: 31 mEq/L (ref 19–32)
CREATININE: 1.26 mg/dL (ref 0.50–1.35)
Calcium: 8.8 mg/dL (ref 8.4–10.5)
Chloride: 100 mEq/L (ref 96–112)
GFR calc Af Amer: 65 mL/min — ABNORMAL LOW (ref >90)
GFR, EST NON AFRICAN AMERICAN: 56 mL/min — AB (ref >90)
GLUCOSE: 88 mg/dL (ref 70–99)
Potassium: 3.6 mEq/L — ABNORMAL LOW (ref 3.7–5.3)
Sodium: 142 mEq/L (ref 137–147)

## 2013-09-10 LAB — CBC
HCT: 29.1 % — ABNORMAL LOW (ref 39.0–52.0)
Hemoglobin: 9.7 g/dL — ABNORMAL LOW (ref 13.0–17.0)
MCH: 29 pg (ref 26.0–34.0)
MCHC: 33.3 g/dL (ref 30.0–36.0)
MCV: 87.1 fL (ref 78.0–100.0)
PLATELETS: 212 10*3/uL (ref 150–400)
RBC: 3.34 MIL/uL — AB (ref 4.22–5.81)
RDW: 15.6 % — ABNORMAL HIGH (ref 11.5–15.5)
WBC: 4.4 10*3/uL (ref 4.0–10.5)

## 2013-09-10 LAB — PRO B NATRIURETIC PEPTIDE: Pro B Natriuretic peptide (BNP): 11292 pg/mL — ABNORMAL HIGH (ref 0–125)

## 2013-09-10 MED ORDER — FUROSEMIDE 40 MG PO TABS
40.0000 mg | ORAL_TABLET | Freq: Every day | ORAL | Status: DC
  Administered 2013-09-11 – 2013-09-13 (×3): 40 mg via ORAL
  Filled 2013-09-10 (×4): qty 1

## 2013-09-10 MED ORDER — POTASSIUM CHLORIDE CRYS ER 20 MEQ PO TBCR
40.0000 meq | EXTENDED_RELEASE_TABLET | Freq: Once | ORAL | Status: AC
  Administered 2013-09-10: 40 meq via ORAL
  Filled 2013-09-10: qty 2

## 2013-09-10 MED ORDER — PROMETHAZINE HCL 25 MG PO TABS
12.5000 mg | ORAL_TABLET | Freq: Four times a day (QID) | ORAL | Status: DC | PRN
  Administered 2013-09-10: 12.5 mg via ORAL
  Filled 2013-09-10: qty 1

## 2013-09-10 NOTE — Progress Notes (Signed)
No issues overnight. Pt seen by Urology. Pt reports feeling well, denies difficulty with cath. No c/o.  EXAM:  BP 149/59  Pulse 78  Temp(Src) 98 F (36.7 C) (Oral)  Resp 18  Ht 6' (1.829 m)  Wt 90 kg (198 lb 6.6 oz)  BMI 26.90 kg/m2  SpO2 96%  Awake, alert, oriented  Speech fluent, appropriate  CN grossly intact  5/5 BUE/BLE   IMPRESSION:  73 y.o. male admitted with UTI, doing well  PLAN: - Cont IV cefepime till Feb 23 per ID - CIC q4 hrs

## 2013-09-10 NOTE — Progress Notes (Signed)
ANTICOAGULATION CONSULT NOTE - Follow Up Consult  Pharmacy Consult for lovenox Indication: chest pain/ACS  Allergies  Allergen Reactions  . Morphine Other (See Comments)    REACTION: made him go crazy; "I had fun w/it; my family didn't think it was too funny"  . Metoprolol Other (See Comments)    REACTION:  Tachycardia; "don't remember how bad it was; it was so long ago"  . Rifampin     Itch     Patient Measurements: Height: 6' (182.9 cm) Weight: 198 lb 6.6 oz (90 kg) IBW/kg (Calculated) : 77.6  Vital Signs: Temp: 98.2 F (36.8 C) (02/14 1416) Temp src: Oral (02/14 1416) BP: 110/62 mmHg (02/14 1416) Pulse Rate: 61 (02/14 1416)  Labs:  Recent Labs  09/07/13 1547 09/07/13 1959 09/08/13 0448 09/08/13 0837 09/09/13 0515 09/09/13 0850 09/10/13 0620 09/10/13 0830  HGB 8.4*  --   --  9.1*  --  9.1*  --  9.7*  HCT 24.9*  --   --  27.7*  --  27.4*  --  29.1*  PLT 129*  --   --  151  --  164  --  212  CREATININE 1.34  --   --  1.35 1.42*  --  1.26  --   TROPONINI 0.56* 0.55* 0.51*  --   --   --   --   --     Estimated Creatinine Clearance: 59.9 ml/min (by C-G formula based on Cr of 1.26).   Medications:  Scheduled:  . aspirin EC  81 mg Oral Daily  . atorvastatin  40 mg Oral Daily  . carvedilol  6.25 mg Oral BID WC  . ceFEPime (MAXIPIME) IV  1 g Intravenous Q12H  . citalopram  40 mg Oral Daily  . colchicine  0.6 mg Oral BID  . doxazosin  8 mg Oral QHS  . enoxaparin (LOVENOX) injection  90 mg Subcutaneous Q12H  . [START ON 09/11/2013] furosemide  40 mg Oral Daily  . gabapentin  600 mg Oral TID  . OxyCODONE  20 mg Oral Q12H  . predniSONE  10 mg Oral Q breakfast  . tamsulosin  0.4 mg Oral QPC breakfast  . vitamin C  500 mg Oral Daily    Assessment: 73 yo male on lovenox for possible ACS and per cariology elevated troponin likely in setting of HF. SCr= 1.26 (CrCl ~ 60), Hg= 9.7, plt= 212.   Goal of Therapy:  Monitor platelets by anticoagulation protocol: Yes    Plan:  -No lovenox dose changes needed -Consider changing lovenox to DVT prophylaxis dosing?  Thank you, Harland Germanndrew Jorgina Binning, Pharm D 09/10/2013 3:47 PM

## 2013-09-10 NOTE — Progress Notes (Signed)
SUBJECTIVE: Denies chest pain and says breathing has markedly improved, as has leg swelling. Denies fevers, chills, dysuria.     Intake/Output Summary (Last 24 hours) at 09/10/13 1306 Last data filed at 09/10/13 0300  Gross per 24 hour  Intake      0 ml  Output   2651 ml  Net  -2651 ml    Current Facility-Administered Medications  Medication Dose Route Frequency Provider Last Rate Last Dose  . acetaminophen (TYLENOL) tablet 325-650 mg  325-650 mg Oral Q4H PRN Karn Cassis, MD   650 mg at 09/08/13 1653  . aspirin EC tablet 81 mg  81 mg Oral Daily Christiane Ha, MD   81 mg at 09/10/13 1116  . atorvastatin (LIPITOR) tablet 40 mg  40 mg Oral Daily Karn Cassis, MD   40 mg at 09/10/13 1115  . carvedilol (COREG) tablet 6.25 mg  6.25 mg Oral BID WC Christiane Ha, MD   6.25 mg at 09/10/13 0926  . ceFEPIme (MAXIPIME) 1 g in dextrose 5 % 50 mL IVPB  1 g Intravenous Q12H Cliffton Asters, MD   1 g at 09/10/13 1116  . citalopram (CELEXA) tablet 40 mg  40 mg Oral Daily Karn Cassis, MD   40 mg at 09/09/13 2049  . colchicine tablet 0.6 mg  0.6 mg Oral BID Karn Cassis, MD   0.6 mg at 09/10/13 0700  . doxazosin (CARDURA) tablet 8 mg  8 mg Oral QHS Karn Cassis, MD   8 mg at 09/09/13 2133  . enoxaparin (LOVENOX) injection 90 mg  90 mg Subcutaneous Q12H Karn Cassis, MD   90 mg at 09/10/13 1116  . [START ON 09/11/2013] furosemide (LASIX) tablet 40 mg  40 mg Oral Daily Laqueta Linden, MD      . gabapentin (NEURONTIN) capsule 600 mg  600 mg Oral TID Karn Cassis, MD   600 mg at 09/10/13 1114  . methocarbamol (ROBAXIN) tablet 500 mg  500 mg Oral QID PRN Karn Cassis, MD   500 mg at 09/08/13 2240  . nitroGLYCERIN (NITROSTAT) SL tablet 0.4 mg  0.4 mg Sublingual Q5 min PRN Karn Cassis, MD      . OxyCODONE (OXYCONTIN) 12 hr tablet 20 mg  20 mg Oral Q12H Karn Cassis, MD   20 mg at 09/10/13 1115  . predniSONE (DELTASONE) tablet 10 mg  10 mg Oral Q  breakfast Karn Cassis, MD   10 mg at 09/10/13 0926  . sodium chloride 0.9 % injection 10-40 mL  10-40 mL Intracatheter PRN Cliffton Asters, MD   20 mL at 09/10/13 0431  . tamsulosin (FLOMAX) capsule 0.4 mg  0.4 mg Oral QPC breakfast Karn Cassis, MD   0.4 mg at 09/10/13 1115  . vitamin C (ASCORBIC ACID) tablet 500 mg  500 mg Oral Daily Karn Cassis, MD   500 mg at 09/10/13 1114    Filed Vitals:   09/09/13 1807 09/09/13 2114 09/10/13 0456 09/10/13 1004  BP: 152/56 156/59 149/59 130/53  Pulse: 52 55 78 58  Temp: 97.1 F (36.2 C) 98 F (36.7 C) 98 F (36.7 C) 97.5 F (36.4 C)  TempSrc: Oral Oral Oral Oral  Resp: 18 20 18 18   Height:      Weight:      SpO2: 96% 96% 96% 95%    PHYSICAL EXAM General: NAD Neck: No JVD, no thyromegaly or thyroid  nodule.  Lungs: Clear to auscultation bilaterally with normal respiratory effort. CV: Nondisplaced PMI.  Regular rhythm, normal S1/S2, no S3/S4, no murmur.  No pretibial edema.  No carotid bruit.  Normal pedal pulses.  Abdomen: Soft, nontender, no hepatosplenomegaly, no distention.  Neurologic: Alert and oriented x 3.  Psych: Normal affect. Extremities: No clubbing or cyanosis.   TELEMETRY: Reviewed telemetry pt in normal sinus rhythm with PVC's.  LABS: Basic Metabolic Panel:  Recent Labs  16/10/96 0515 09/10/13 0620  NA 139 142  K 4.1 3.6*  CL 101 100  CO2 29 31  GLUCOSE 85 88  BUN 28* 24*  CREATININE 1.42* 1.26  CALCIUM 8.4 8.8   Liver Function Tests: No results found for this basename: AST, ALT, ALKPHOS, BILITOT, PROT, ALBUMIN,  in the last 72 hours No results found for this basename: LIPASE, AMYLASE,  in the last 72 hours CBC:  Recent Labs  09/07/13 1547  09/09/13 0850 09/10/13 0830  WBC 9.0  < > 4.1 4.4  NEUTROABS 8.1*  --   --   --   HGB 8.4*  < > 9.1* 9.7*  HCT 24.9*  < > 27.4* 29.1*  MCV 87.7  < > 86.7 87.1  PLT 129*  < > 164 212  < > = values in this interval not displayed. Cardiac  Enzymes:  Recent Labs  09/07/13 1547 09/07/13 1959 09/08/13 0448  TROPONINI 0.56* 0.55* 0.51*   BNP: No components found with this basename: POCBNP,  D-Dimer: No results found for this basename: DDIMER,  in the last 72 hours Hemoglobin A1C: No results found for this basename: HGBA1C,  in the last 72 hours Fasting Lipid Panel: No results found for this basename: CHOL, HDL, LDLCALC, TRIG, CHOLHDL, LDLDIRECT,  in the last 72 hours Thyroid Function Tests:  Recent Labs  09/07/13 1547  TSH 2.362   Anemia Panel:  Recent Labs  09/08/13 0837  VITAMINB12 419  FOLATE 10.0  FERRITIN 11*  TIBC 202*  IRON 15*  RETICCTPCT 1.5    RADIOLOGY: Dg Chest 1 View  09/07/2013   CLINICAL DATA:  Increasing shortness of breath, being treated for infection  EXAM: CHEST - 1 VIEW  COMPARISON:  09/05/2013  FINDINGS: Status post CABG with mild stable cardiac enlargement. Moderate to severe vascular congestion. Decreased interstitial prominence. Small bilateral pleural effusions.  IMPRESSION: Stable vascular congestion with improving pulmonary edema.   Electronically Signed   By: Esperanza Heir M.D.   On: 09/07/2013 14:03   Dg Chest 2 View  08/30/2013   CLINICAL DATA:  Bilateral lower extremity edema.  EXAM: CHEST  2 VIEW  COMPARISON:  Single view of the chest 08/04/2013. PA and lateral chest 06/17/2013 and 12/12/2011.  FINDINGS: Heart size is upper normal. There is no pulmonary edema, consolidative process or pneumothorax. Chronic blunting of right costophrenic angle is compatible with scar. Calcified granulomata on the right are again seen.  IMPRESSION: No acute disease.  Stable compared to prior exams.   Electronically Signed   By: Drusilla Kanner M.D.   On: 08/30/2013 22:00   Dg Lumbar Spine Complete  08/15/2013   CLINICAL DATA:  Worsening lower back pain. Status post lumbar decompression with infection.  EXAM: LUMBAR SPINE - COMPLETE 4+ VIEW  COMPARISON:  DG ABD 1 VIEW dated 08/08/2013; DG LUMBAR  SPINE COMPLETE dated 08/02/2013; MR L SPINE WO/W CM dated 07/29/2013  FINDINGS: The previously noted abscesses are not well characterized on radiograph. The patient is status post decompression along the  lower lumbar spine; poor definition of the remaining posterior elements is nonspecific. Lumbar spinal fusion hardware is again noted at L4-S1. The visualized bony foramina are grossly unremarkable, though not well assessed at the sites of surgery.  The visualized bowel gas pattern is grossly unremarkable. Abdominal wall mesh is noted.  IMPRESSION: The remaining posterior elements at the lower lumbar spine are poorly defined on radiograph; this is nonspecific. Status post decompression at the lower lumbar spine, with lumbar spinal fusion at L4-S1. Previously noted epidural abscesses are not well characterized on radiograph. If the patient's symptoms continue to worsen, repeat MRI could be considered for further evaluation.   Electronically Signed   By: Roanna Raider M.D.   On: 08/15/2013 21:20   Mr Lumbar Spine W Wo Contrast  09/05/2013   CLINICAL DATA:  Fever and chills.  Recent epidural abscess.  EXAM: MRI LUMBAR SPINE WITHOUT AND WITH CONTRAST  TECHNIQUE: Multiplanar and multiecho pulse sequences of the lumbar spine were obtained without and with intravenous contrast.  CONTRAST:  9mL MULTIHANCE GADOBENATE DIMEGLUMINE 529 MG/ML IV SOLN  COMPARISON:  Radiographs dated 08/15/2013 and lumbar MRI dated 07/29/2013 and 06/07/2013  FINDINGS: There is a tiny residual amount of fluid adjacent to the anterior medial aspect of the right facet joint at L3-4 seen on images 23 and 24 series 5. This fluid in appearance to extend into the facet joint at the level of the previous more prominent epidural abscess.  There are connecting midline fluid collections along the incision which could represent seroma but given the patient's history, this could represent pus in the posterior soft tissues. This is best seen on images 7 and 8 of  series 4.  There are postsurgical changes at the disc spaces at L4-5 and L5-S1 without evidence of osteomyelitis or discitis. There is a tiny amount of fluid in the L5-S1 disc space. I doubt that this represents infection.  There is small broad-based soft disc protrusion at L3-4 which compresses the the thecal sac relatively severely. This is best seen on image 24 of series 5.  T12-L1 and L1-2:  Normal.  L2-3: Posterior decompression. Tiny broad-based disc bulge with no neural impingement. Tiny amount of fluid adjacent to the inferior aspect of the left facet joint at L2-3 which could represent pus and probably communicates with the other fluid collections. This is seen on image 11 of series 4 and image 19 of series 5.  IMPRESSION: 1. Tiny amount of residual fluid in the right posterior lateral aspect of the epidural space at L3-4. The majority of epidural fluid seen on the prior exam has been evacuated. 2. Multiple enhancing fluid collections in the posterior paraspinal soft tissues which could represent abscesses, as described. 3. Compression of the thecal sac at L3-4 due to a broad-based disc protrusion.   Electronically Signed   By: Geanie Cooley M.D.   On: 09/05/2013 15:14   Dg Chest Port 1 View  09/05/2013   CLINICAL DATA:  Fever.  Short of breath.  EXAM: PORTABLE CHEST - 1 VIEW  COMPARISON:  08/30/2013.  FINDINGS: Mild enlargement of the cardiopericardial silhouette. Lower cervical ACDF. CABG. Calcified granuloma in the right lung. There is diffuse interstitial prominence, most compatible with interstitial pulmonary edema. Chronic blunting of the right costophrenic angle. There is no focal consolidation identified.  IMPRESSION: Cardiomegaly and interstitial pulmonary edema compatible with mild CHF.   Electronically Signed   By: Andreas Newport M.D.   On: 09/05/2013 21:16  ASSESSMENT AND PLAN: 1. Acute on chronic combined systolic and diastolic heart failure: EF by nuclear stress test in 09/2012 was  normal, now EF by echo 45% with wall motion abnormality. Given history of CAD/CABG, I would recommend outpatient repeat nuclear imaging study. He has had good urinary output with IV Lasix. BNP will be elevated given high filling pressures in context of grade III diastolic dysfunction. I will d/c IV Lasix and switch to p.o. Lasix 40 mg daily. Continue Coreg. I will add low-dose ACEI. BNP 33,476 (2/13)-->11,292 (2/14). 2. CAD s/p CABG: symptomatically stable, but given reduction in EF, would consider outpatient nuclear stress test. Continue ASA, beta blocker, statin, and I will add ACEI. Troponin elevation unlikely to represent ACS, favor acute decompensated heart failure as etiology. 3. HTN: current controlled. 4. UTI: continue antimicrobial therapy with IV cefepime.   Prentice DockerSuresh Koneswaran, M.D., F.A.C.C.

## 2013-09-10 NOTE — Progress Notes (Signed)
[facility] CONSULT NOTE/ PROGRESS NOTE  [name] CZY:[id number] DOB: Dec 15, 1940 DOA: 09/05/2013 PCP: Fredirick Maudlin, MD  Assessment/Plan: #1 acute combined systolic/diastolic CHF exacerbation Clinical improvement. Cardiac enzymes elevated. 2-D echo with EF of 45% with hypokinesis of the anteroseptal and mild mitral regurgitation.. Pro BNP is elevated. I/O = -12.454 L. Current weight is 90 kg. Continue IV Lasix. Elevated troponins felt to be secondary to acute CHF exacerbation per cardiology. Patient with no active chest pain. 2-D echo with a 45% with new hypokinesis of the anterioroseptum. Continue IV Lasix. Will defer transition of IV to oral Lasix per cardiology. Per cardiology.  #2 dyspnea Secondary to problem #1. Improved.  #3 fever/chills Likely secondary to Pseudomonas and/or Escherichia coli UTI related to bladder outlet obstruction or I D. Patient currently on IV cefepime. Per ID.  #4. Pseudomonas and Escherichia coli UTI On IV cefepime. Per infectious diseases.  #5 elevated troponins May be secondary to problem #1. Patient currently chest pain-free. 2-D echo with EF of 45% with new hypokinesis of the anterioroseptum and mild mitral regurgitation. Continue aspirin, Lipitor, Coreg, Lasix. Follow.  #6 probable soft tissue abscess in the lumbar region Per primary team. Continue current IV antibiotics.  #7 iron deficiency anemia Hemoglobin currently stable at 9.7. Anemia panel with iron level of 15 and a ferritin of 11. If patient has not had a colonoscopy done will likely benefit from one as outpatient. Will hold off on IV iron until patient recovers from acute infection. Follow.  Code Status: Full Family Communication: Updated patient no family present. Disposition Plan: Per primary team   Consultants: [facility] : Dr. Lendell Caprice 09/07/2013  Infectious diseases: Dr. Orvan Falconer 09/06/2013  Cardiology Dr. Eldridge Dace 09/08/2013  Procedures:  Chest x-ray  09/07/2013, 09/05/2013  MRI of the L-spine 09/05/2013  2-D echo 09/08/13  Antibiotics:  IV cefepime 09/06/2013  IV Rocephin t2/9/15-->09/05/13  Oral Bactrim 09/05/2013 --> 09/06/2013  IV vancomycin 09/05/2013----> 09/05/2013  HPI/Subjective: Patient states his breathing has improved. No complaints.  Objective: Filed Vitals:   09/10/13 1004  BP: 130/53  Pulse: 58  Temp: 97.5 F (36.4 C)  Resp: 18    Intake/Output Summary (Last 24 hours) at 09/10/13 1048 Last data filed at 09/10/13 0300  Gross per 24 hour  Intake      0 ml  Output   3641 ml  Net  -3641 ml   Filed Weights   09/05/13 1946  Weight: 90 kg (198 lb 6.6 oz)    Exam:   General:  NAD  Cardiovascular: Regular rate rhythm no murmurs rubs or gallops.  Respiratory: Bibasilar crackles  Abdomen: Soft, nontender, nondistended, positive bowel sounds  Musculoskeletal: No clubbing or cyanosis. Trace-1 + bilateral lower extremity edema  Data Reviewed: Basic Metabolic Panel:  Recent Labs Lab 09/05/13 1252 09/07/13 1547 09/08/13 0837 09/09/13 0515 09/10/13 0620  NA 136* 136* 137 139 142  K 4.1 4.5 3.9 4.1 3.6*  CL 97 100 100 101 100  CO2 26 23 25 29 31   GLUCOSE 115* 136* 116* 85 88  BUN 13 26* 29* 28* 24*  CREATININE 1.80* 1.34 1.35 1.42* 1.26  CALCIUM 8.6 8.8 8.6 8.4 8.8   Liver Function Tests:  Recent Labs Lab 09/05/13 1252  AST 17  ALT 11  ALKPHOS 57  BILITOT 0.3  PROT 5.4*  ALBUMIN 2.9*   No results found for this basename: LIPASE, AMYLASE,  in the last 168 hours No results found for this basename: AMMONIA,  in the last 168 hours  CBC:  Recent Labs Lab 09/05/13 1252 09/07/13 1547 09/08/13 0837 09/09/13 0850  WBC 11.6* 9.0 7.1 4.1  NEUTROABS 9.8* 8.1*  --   --   HGB 8.7* 8.4* 9.1* 9.1*  HCT 25.9* 24.9* 27.7* 27.4*  MCV 88.4 87.7 87.9 86.7  PLT 141* 129* 151 164   Cardiac Enzymes:  Recent Labs Lab 09/07/13 1547 09/07/13 1959 09/08/13 0448  TROPONINI 0.56* 0.55* 0.51*    BNP (last 3 results)  Recent Labs  09/07/13 1547 09/08/13 0918 09/10/13 0435  PROBNP 23778.0* 33476.0* 11292.0*   CBG: No results found for this basename: GLUCAP,  in the last 168 hours  Recent Results (from the past 240 hour(s))  URINE CULTURE     Status: None   Collection Time    09/02/13  9:51 AM      Result Value Ref Range Status   Specimen Description URINE, CATHETERIZED   Final   Special Requests NONE   Final   Culture  Setup Time     Final   Value: 09/03/2013 00:31     Performed at [facility] Count     Final   Value: >=100,000 COLONIES/ML     Performed at [facility]   Culture     Final   Value: PSEUDOMONAS AERUGINOSA     Performed at [facility]   Report Status 09/04/2013 FINAL   Final   Organism ID, Bacteria PSEUDOMONAS AERUGINOSA   Final  URINE CULTURE     Status: None   Collection Time    09/05/13  1:35 PM      Result Value Ref Range Status   Specimen Description URINE, CATHETERIZED   Final   Special Requests NONE   Final   Culture  Setup Time     Final   Value: 09/05/2013 21:02     Performed at [facility] Count     Final   Value: >=100,000 COLONIES/ML     Performed at [facility]   Culture     Final   Value: ESCHERICHIA COLI     Performed at [facility]   Report Status 09/07/2013 FINAL   Final   Organism ID, Bacteria ESCHERICHIA COLI   Final  CULTURE, BLOOD (ROUTINE X 2)     Status: None   Collection Time    09/05/13 11:15 PM      Result Value Ref Range Status   Specimen Description BLOOD RIGHT ANTECUBITAL   Final   Special Requests BOTTLES DRAWN AEROBIC ONLY 10CC   Final   Culture  Setup Time     Final   Value: 09/06/2013 03:28     Performed at [facility]   Culture     Final   Value:        BLOOD CULTURE RECEIVED NO GROWTH TO DATE CULTURE WILL BE HELD FOR 5 DAYS BEFORE ISSUING A FINAL NEGATIVE REPORT     Performed at [facility]   Report Status  PENDING   Incomplete  CULTURE, BLOOD (ROUTINE X 2)     Status: None   Collection Time    09/05/13 11:20 PM      Result Value Ref Range Status   Specimen Description BLOOD RIGHT HAND   Final   Special Requests BOTTLES DRAWN AEROBIC ONLY 10CC   Final   Culture  Setup Time     Final   Value: 09/06/2013 03:28     [facility]formed  [facility]   Culture     Final   Value:        BLOOD CULTURE RECEIVED NO GROWTH TO DATE CULTURE WILL BE HELD FOR 5 DAYS BEFORE ISSUING A FINAL NEGATIVE REPORT     [facility]formed at [facility]   Report Status PENDING   Incomplete  MRSA PCR SCREENING     Status: None   Collection Time    09/05/13 11:50 PM      Result Value Ref Range Status   MRSA by PCR NEGATIVE  NEGATIVE Final   Comment:            The GeneXpert MRSA Assay (FDA     approved for NASAL specimens     only), is one component of a     comprehensive MRSA colonization     surveillance program. It is not     intended to diagnose MRSA     infection nor to guide or     monitor treatment for     MRSA infections.     Studies: No results found.  Scheduled Meds: . aspirin EC  81 mg Oral Daily  . atorvastatin  40 mg Oral Daily  . carvedilol  6.25 mg Oral BID WC  . ceFEPime (MAXIPIME) IV  1 g Intravenous Q12H  . citalopram  40 mg Oral Daily  . colchicine  0.6 mg Oral BID  . doxazosin  8 mg Oral QHS  . enoxaparin (LOVENOX) injection  90 mg Subcutaneous Q12H  . furosemide  40 mg Intravenous BID  . gabapentin  600 mg Oral TID  . OxyCODONE  20 mg Oral Q12H  . potassium chloride  40 mEq Oral Once  . predniSONE  10 mg Oral Q breakfast  . tamsulosin  0.4 mg Oral QPC breakfast  . vitamin C  500 mg Oral Daily   Continuous Infusions:   Principal Problem:   Fever and chills Active Problems:   Acute CHF (congestive heart failure)   HYPERTENSION   CAD in native artery   Lumbar degenerative disc disease   Cauda equina syndrome with neurogenic bladder   Altered mental status   Dyspnea    UTI (urinary tract infection)   Elevated troponin   Anemia, iron deficiency    Time spent: 40 mins    [facility] Eye Surgery CenterHOMPSON,DANIEL MD [facility] Pager [phone]. If 7PM-7AM, please contact night-coverage at [email or web address], password [email or web address] 09/10/2013, 10:48 AM  LOS: 5 days

## 2013-09-11 DIAGNOSIS — I10 Essential (primary) hypertension: Secondary | ICD-10-CM

## 2013-09-11 LAB — BASIC METABOLIC PANEL
BUN: 25 mg/dL — ABNORMAL HIGH (ref 6–23)
CALCIUM: 8.5 mg/dL (ref 8.4–10.5)
CO2: 28 meq/L (ref 19–32)
CREATININE: 1.31 mg/dL (ref 0.50–1.35)
Chloride: 100 mEq/L (ref 96–112)
GFR calc Af Amer: 62 mL/min — ABNORMAL LOW (ref >90)
GFR calc non Af Amer: 54 mL/min — ABNORMAL LOW (ref >90)
Glucose, Bld: 90 mg/dL (ref 70–99)
Potassium: 3.9 mEq/L (ref 3.7–5.3)
Sodium: 141 mEq/L (ref 137–147)

## 2013-09-11 LAB — PRO B NATRIURETIC PEPTIDE: Pro B Natriuretic peptide (BNP): 3379 pg/mL — ABNORMAL HIGH (ref 0–125)

## 2013-09-11 LAB — CLOSTRIDIUM DIFFICILE BY PCR: Toxigenic C. Difficile by PCR: NEGATIVE

## 2013-09-11 MED ORDER — LOPERAMIDE HCL 2 MG PO CAPS
2.0000 mg | ORAL_CAPSULE | ORAL | Status: DC | PRN
  Filled 2013-09-11: qty 1

## 2013-09-11 MED ORDER — OXYCODONE HCL 5 MG PO TABS
2.5000 mg | ORAL_TABLET | Freq: Once | ORAL | Status: AC
  Administered 2013-09-11: 2.5 mg via ORAL
  Filled 2013-09-11: qty 1

## 2013-09-11 MED ORDER — OXYCODONE HCL 5 MG PO TABS
10.0000 mg | ORAL_TABLET | ORAL | Status: DC | PRN
  Administered 2013-09-11 – 2013-09-20 (×5): 10 mg via ORAL
  Filled 2013-09-11 (×6): qty 2

## 2013-09-11 MED ORDER — OXYCODONE HCL 5 MG PO TABS
7.5000 mg | ORAL_TABLET | ORAL | Status: DC | PRN
  Administered 2013-09-11 (×2): 7.5 mg via ORAL
  Filled 2013-09-11 (×2): qty 2

## 2013-09-11 NOTE — Progress Notes (Signed)
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 Course c/b a/c HF. EF by ECHO now 45% (was previously normal)   Denies chest pain or dyspnea. Edema resolved. On po lasix.    Intake/Output Summary (Last 24 hours) at 09/11/13 1243 Last data filed at 09/11/13 0530  Gross per 24 hour  Intake    120 ml  Output   1840 ml  Net  -1720 ml    Current Facility-Administered Medications  Medication Dose Route Frequency Provider Last Rate Last Dose  . acetaminophen (TYLENOL) tablet 325-650 mg  325-650 mg Oral Q4H PRN Karn Cassis, MD   650 mg at 09/11/13 1610  . aspirin EC tablet 81 mg  81 mg Oral Daily Christiane Ha, MD   81 mg at 09/11/13 1010  . atorvastatin (LIPITOR) tablet 40 mg  40 mg Oral Daily Karn Cassis, MD   40 mg at 09/11/13 1010  . carvedilol (COREG) tablet 6.25 mg  6.25 mg Oral BID WC Christiane Ha, MD   6.25 mg at 09/11/13 9604  . ceFEPIme (MAXIPIME) 1 g in dextrose 5 % 50 mL IVPB  1 g Intravenous Q12H Cliffton Asters, MD   1 g at 09/11/13 1010  . citalopram (CELEXA) tablet 40 mg  40 mg Oral Daily Karn Cassis, MD   40 mg at 09/10/13 2219  . colchicine tablet 0.6 mg  0.6 mg Oral BID Karn Cassis, MD   0.6 mg at 09/11/13 0550  . doxazosin (CARDURA) tablet 8 mg  8 mg Oral QHS Karn Cassis, MD   8 mg at 09/10/13 2220  . enoxaparin (LOVENOX) injection 90 mg  90 mg Subcutaneous Q12H Karn Cassis, MD   90 mg at 09/11/13 1010  . furosemide (LASIX) tablet 40 mg  40 mg Oral Daily Laqueta Linden, MD   40 mg at 09/11/13 1010  . gabapentin (NEURONTIN) capsule 600 mg  600 mg Oral TID Karn Cassis, MD   600 mg at 09/11/13 1010  . loperamide (IMODIUM) capsule 2 mg  2 mg Oral PRN Rodolph Bong, MD      . methocarbamol (ROBAXIN) tablet 500 mg  500 mg Oral QID PRN Karn Cassis, MD   500 mg at 09/08/13 2240  .  nitroGLYCERIN (NITROSTAT) SL tablet 0.4 mg  0.4 mg Sublingual Q5 min PRN Karn Cassis, MD      . OxyCODONE (OXYCONTIN) 12 hr tablet 20 mg  20 mg Oral Q12H Karn Cassis, MD   20 mg at 09/11/13 1010  . predniSONE (DELTASONE) tablet 10 mg  10 mg Oral Q breakfast Karn Cassis, MD   10 mg at 09/11/13 5409  . promethazine (PHENERGAN) tablet 12.5 mg  12.5 mg Oral Q6H PRN Reinaldo Meeker, MD   12.5 mg at 09/10/13 1710  . sodium chloride 0.9 % injection 10-40 mL  10-40 mL Intracatheter PRN Cliffton Asters, MD   10 mL at 09/11/13 0424  . tamsulosin (FLOMAX) capsule 0.4 mg  0.4 mg Oral QPC breakfast Karn Cassis, MD   0.4 mg at 09/11/13 1010  . vitamin C (ASCORBIC ACID) tablet 500 mg  500 mg Oral Daily Karn Cassis, MD   500 mg at 09/11/13 1010    Filed Vitals:   09/10/13 2122 09/11/13 0109 09/11/13 0536 09/11/13 1000  BP: 130/74 156/80 121/71 138/56  Pulse: 60 61 58 55  Temp: 97.8 F (36.6 C) 98 F (36.7 C) 98.1 F (36.7 C) 98.1 F (36.7 C)  TempSrc: Oral Oral Oral Oral  Resp: 18 18 18 18   Height:      Weight:      SpO2: 96% 95% 96% 99%    PHYSICAL EXAM General: NAD Neck: No JVD, no thyromegaly or thyroid nodule.  Lungs: Clear to auscultation bilaterally with normal respiratory effort. CV: Nondisplaced PMI.  Regular rhythm, normal S1/S2, no S3/S4, no murmur.  No pretibial edema.  No carotid bruit.  Normal pedal pulses.  Abdomen: Soft, nontender, no hepatosplenomegaly, no distention.  Neurologic: Alert and oriented x 3.  Psych: Normal affect. Extremities: No clubbing or cyanosis.   TELEMETRY: Reviewed telemetry pt in normal sinus rhythm with PVC's.  LABS: Basic Metabolic Panel:  Recent Labs  16/10/96 0620 09/11/13 0430  NA 142 141  K 3.6* 3.9  CL 100 100  CO2 31 28  GLUCOSE 88 90  BUN 24* 25*  CREATININE 1.26 1.31  CALCIUM 8.8 8.5   Liver Function Tests: No results found for this basename: AST, ALT, ALKPHOS, BILITOT, PROT, ALBUMIN,  in the last 72  hours No results found for this basename: LIPASE, AMYLASE,  in the last 72 hours CBC:  Recent Labs  09/09/13 0850 09/10/13 0830  WBC 4.1 4.4  HGB 9.1* 9.7*  HCT 27.4* 29.1*  MCV 86.7 87.1  PLT 164 212   Cardiac Enzymes: No results found for this basename: CKTOTAL, CKMB, CKMBINDEX, TROPONINI,  in the last 72 hours BNP: No components found with this basename: POCBNP,  D-Dimer: No results found for this basename: DDIMER,  in the last 72 hours Hemoglobin A1C: No results found for this basename: HGBA1C,  in the last 72 hours Fasting Lipid Panel: No results found for this basename: CHOL, HDL, LDLCALC, TRIG, CHOLHDL, LDLDIRECT,  in the last 72 hours Thyroid Function Tests: No results found for this basename: TSH, T4TOTAL, FREET3, T3FREE, THYROIDAB,  in the last 72 hours Anemia Panel: No results found for this basename: VITAMINB12, FOLATE, FERRITIN, TIBC, IRON, RETICCTPCT,  in the last 72 hours  RADIOLOGY: Dg Chest 1 View  09/07/2013   CLINICAL DATA:  Increasing shortness of breath, being treated for infection  EXAM: CHEST - 1 VIEW  COMPARISON:  09/05/2013  FINDINGS: Status post CABG with mild stable cardiac enlargement. Moderate to severe vascular congestion. Decreased interstitial prominence. Small bilateral pleural effusions.  IMPRESSION: Stable vascular congestion with improving pulmonary edema.   Electronically Signed   By: Esperanza Heir M.D.   On: 09/07/2013 14:03   Dg Chest 2 View  08/30/2013   CLINICAL DATA:  Bilateral lower extremity edema.  EXAM: CHEST  2 VIEW  COMPARISON:  Single view of the chest 08/04/2013. PA and lateral chest 06/17/2013 and 12/12/2011.  FINDINGS: Heart size is upper normal. There is no pulmonary edema, consolidative process or pneumothorax. Chronic blunting of right costophrenic angle is compatible with scar. Calcified granulomata on the right are again seen.  IMPRESSION: No acute disease.  Stable compared to prior exams.   Electronically Signed   By: Drusilla Kanner M.D.   On: 08/30/2013 22:00   Dg Lumbar Spine Complete  08/15/2013   CLINICAL DATA:  Worsening lower back pain. Status post lumbar decompression with infection.  EXAM: LUMBAR SPINE - COMPLETE 4+ VIEW  COMPARISON:  DG ABD 1 VIEW dated 08/08/2013; DG LUMBAR SPINE COMPLETE dated 08/02/2013;  MR L SPINE WO/W CM dated 07/29/2013  FINDINGS: The previously noted abscesses are not well characterized on radiograph. The patient is status post decompression along the lower lumbar spine; poor definition of the remaining posterior elements is nonspecific. Lumbar spinal fusion hardware is again noted at L4-S1. The visualized bony foramina are grossly unremarkable, though not well assessed at the sites of surgery.  The visualized bowel gas pattern is grossly unremarkable. Abdominal wall mesh is noted.  IMPRESSION: The remaining posterior elements at the lower lumbar spine are poorly defined on radiograph; this is nonspecific. Status post decompression at the lower lumbar spine, with lumbar spinal fusion at L4-S1. Previously noted epidural abscesses are not well characterized on radiograph. If the patient's symptoms continue to worsen, repeat MRI could be considered for further evaluation.   Electronically Signed   By: Roanna RaiderJeffery  Chang M.D.   On: 08/15/2013 21:20   Mr Lumbar Spine W Wo Contrast  09/05/2013   CLINICAL DATA:  Fever and chills.  Recent epidural abscess.  EXAM: MRI LUMBAR SPINE WITHOUT AND WITH CONTRAST  TECHNIQUE: Multiplanar and multiecho pulse sequences of the lumbar spine were obtained without and with intravenous contrast.  CONTRAST:  9mL MULTIHANCE GADOBENATE DIMEGLUMINE 529 MG/ML IV SOLN  COMPARISON:  Radiographs dated 08/15/2013 and lumbar MRI dated 07/29/2013 and 06/07/2013  FINDINGS: There is a tiny residual amount of fluid adjacent to the anterior medial aspect of the right facet joint at L3-4 seen on images 23 and 24 series 5. This fluid in appearance to extend into the facet joint at the level of  the previous more prominent epidural abscess.  There are connecting midline fluid collections along the incision which could represent seroma but given the patient's history, this could represent pus in the posterior soft tissues. This is best seen on images 7 and 8 of series 4.  There are postsurgical changes at the disc spaces at L4-5 and L5-S1 without evidence of osteomyelitis or discitis. There is a tiny amount of fluid in the L5-S1 disc space. I doubt that this represents infection.  There is small broad-based soft disc protrusion at L3-4 which compresses the the thecal sac relatively severely. This is best seen on image 24 of series 5.  T12-L1 and L1-2:  Normal.  L2-3: Posterior decompression. Tiny broad-based disc bulge with no neural impingement. Tiny amount of fluid adjacent to the inferior aspect of the left facet joint at L2-3 which could represent pus and probably communicates with the other fluid collections. This is seen on image 11 of series 4 and image 19 of series 5.  IMPRESSION: 1. Tiny amount of residual fluid in the right posterior lateral aspect of the epidural space at L3-4. The majority of epidural fluid seen on the prior exam has been evacuated. 2. Multiple enhancing fluid collections in the posterior paraspinal soft tissues which could represent abscesses, as described. 3. Compression of the thecal sac at L3-4 due to a broad-based disc protrusion.   Electronically Signed   By: Geanie CooleyJim  Maxwell M.D.   On: 09/05/2013 15:14   Dg Chest Port 1 View  09/05/2013   CLINICAL DATA:  Fever.  Short of breath.  EXAM: PORTABLE CHEST - 1 VIEW  COMPARISON:  08/30/2013.  FINDINGS: Mild enlargement of the cardiopericardial silhouette. Lower cervical ACDF. CABG. Calcified granuloma in the right lung. There is diffuse interstitial prominence, most compatible with interstitial pulmonary edema. Chronic blunting of the right costophrenic angle. There is no focal consolidation identified.  IMPRESSION: Cardiomegaly  and  interstitial pulmonary edema compatible with mild CHF.   Electronically Signed   By: Andreas Newport M.D.   On: 09/05/2013 21:16      ASSESSMENT AND PLAN: 1. Acute on chronic combined systolic and diastolic heart failure: EF by nuclear stress test in 09/2012 was normal, now EF by echo 45% with wall motion abnormality. Trop only 0.5. Will plan Lexiscan Myoview in am. Volume status looks good on po lasix. Renal function stable.    2. CAD s/p CABG (c/b sternal infection): symptomatically stable, but given reduction in EF, will get inpatient Myoview prior to d/c tomorrow. Continue ASA, beta blocker, statin, and add ACEI.   3. HTN: current controlled. 4. UTI: continue antimicrobial therapy with IV cefepime.   Daniel Bensimhon,MD 12:49 PM

## 2013-09-11 NOTE — Progress Notes (Signed)
Patient ID: [name], male   DOB: 12/02/1940, 73 y.o.   MRN: [id number] Afeb, vss Feeling steadily better. Home soon hopefully per Dr Jeral FruitBotero with home IV antibiotics.

## 2013-09-11 NOTE — Progress Notes (Signed)
[facility] CONSULT NOTE/ PROGRESS NOTE  [name] EAV:[id number] DOB: 05/11/1941 DOA: 09/05/2013 PCP: [name],[name], MD  Assessment/Plan: #1 acute combined systolic/diastolic CHF exacerbation Clinical improvement. Cardiac enzymes elevated. 2-D echo with EF of 45% with hypokinesis of the anteroseptal and mild mitral regurgitation.. Pro BNP is elevated. I/O = -12.454 L. Current weight is 90 kg. Continue IV Lasix. Elevated troponins felt to be secondary to acute CHF exacerbation per cardiology. Patient with no active chest pain. 2-D echo with a 45% with new hypokinesis of the anterioroseptum. Continue IV Lasix. Will defer transition of IV to oral Lasix per cardiology. Per cardiology.  #2 dyspnea Secondary to problem #1. Improved.  #3 fever/chills Likely secondary to Pseudomonas and/or Escherichia coli UTI related to bladder outlet obstruction or I D. Patient currently on IV cefepime. Per ID.  #4. Pseudomonas and Escherichia coli UTI On IV cefepime. Per infectious diseases.  #5 elevated troponins May be secondary to problem #1. Patient currently chest pain-free. 2-D echo with EF of 45% with new hypokinesis of the anterioroseptum and mild mitral regurgitation. Continue aspirin, Lipitor, Coreg, Lasix. Follow.  #6 probable soft tissue abscess in the lumbar region Per primary team. Continue current IV antibiotics.  #7 iron deficiency anemia Hemoglobin currently stable at 9.7. Anemia panel with iron level of 15 and a ferritin of 11. If patient has not had a colonoscopy done will likely benefit from one as outpatient. Will hold off on IV iron until patient recovers from acute infection. Follow.  #8 diarrhea C. difficile PCR negative. Imodium as needed.  Code Status: Full Family Communication: Updated patient no family present. Disposition Plan: Per primary team   Consultants: [facility] : Dr. [name] 09/07/2013  Infectious diseases: Dr. [name]  09/06/2013  Cardiology Dr. [name] 09/08/2013  Procedures:  Chest x-ray 09/07/2013, 09/05/2013  MRI of the L-spine 09/05/2013  2-D echo 09/08/13  Antibiotics:  IV cefepime 09/06/2013  IV Rocephin t2/9/15-->09/05/13  Oral Bactrim 09/05/2013 --> 09/06/2013  IV vancomycin 09/05/2013----> 09/05/2013  HPI/Subjective: Patient states his breathing has improved. No complaints. Patient with multiple loose stools overnight.  Objective: Filed Vitals:   09/11/13 1000  BP: 138/56  Pulse: 55  Temp: 98.1 F (36.7 C)  Resp: 18    Intake/Output Summary (Last 24 hours) at 09/11/13 1145 Last data filed at 09/11/13 0530  Gross per 24 hour  Intake    120 ml  Output   1840 ml  Net  -1720 ml   Filed Weights   09/05/13 1946  Weight: 90 kg (198 lb 6.6 oz)    Exam:   General:  NAD  Cardiovascular: Regular rate rhythm no murmurs rubs or gallops.  Respiratory: CTAB anterior lung fields.  Abdomen: Soft, nontender, nondistended, positive bowel sounds  Musculoskeletal: No clubbing or cyanosis. Trace + bilateral lower extremity edema  Data Reviewed: Basic Metabolic Panel:  Recent Labs Lab 09/07/13 1547 09/08/13 0837 09/09/13 0515 09/10/13 0620 09/11/13 0430  NA 136* 137 139 142 141  K 4.5 3.9 4.1 3.6* 3.9  CL 100 100 101 100 100  CO2 23 25 29 31 28   GLUCOSE 136* 116* 85 88 90  BUN 26* 29* 28* 24* 25*  CREATININE 1.34 1.35 1.42* 1.26 1.31  CALCIUM 8.8 8.6 8.4 8.8 8.5   Liver Function Tests:  Recent Labs Lab 09/05/13 1252  AST 17  ALT 11  ALKPHOS 57  BILITOT 0.3  PROT 5.4*  ALBUMIN 2.9*   No results found for this basename: LIPASE, AMYLASE,  in the  last 168 hours No results found for this basename: AMMONIA,  in the last 168 hours CBC:  Recent Labs Lab 09/05/13 1252 09/07/13 1547 09/08/13 0837 09/09/13 0850 09/10/13 0830  WBC 11.6* 9.0 7.1 4.1 4.4  NEUTROABS 9.8* 8.1*  --   --   --   HGB 8.7* 8.4* 9.1* 9.1* 9.7*  HCT 25.9* 24.9* 27.7* 27.4* 29.1*   MCV 88.4 87.7 87.9 86.7 87.1  PLT 141* 129* 151 164 212   Cardiac Enzymes:  Recent Labs Lab 09/07/13 1547 09/07/13 1959 09/08/13 0448  TROPONINI 0.56* 0.55* 0.51*   BNP (last 3 results)  Recent Labs  09/08/13 0918 09/10/13 0435 09/11/13 0430  PROBNP 33476.0* 11292.0* 3379.0*   CBG: No results found for this basename: GLUCAP,  in the last 168 hours  Recent Results (from the past 240 hour(s))  URINE CULTURE     Status: None   Collection Time    09/02/13  9:51 AM      Result Value Ref Range Status   Specimen Description URINE, CATHETERIZED   Final   Special Requests NONE   Final   Culture  Setup Time     Final   Value: 09/03/2013 00:31     Performed at [facility] Count     Final   Value: >=100,000 COLONIES/ML     Performed at [facility]   Culture     Final   Value: PSEUDOMONAS AERUGINOSA     Performed at [facility]   Report Status 09/04/2013 FINAL   Final   Organism ID, Bacteria PSEUDOMONAS AERUGINOSA   Final  URINE CULTURE     Status: None   Collection Time    09/05/13  1:35 PM      Result Value Ref Range Status   Specimen Description URINE, CATHETERIZED   Final   Special Requests NONE   Final   Culture  Setup Time     Final   Value: 09/05/2013 21:02     Performed at [facility] Count     Final   Value: >=100,000 COLONIES/ML     Performed at [facility]   Culture     Final   Value: ESCHERICHIA COLI     Performed at [facility]   Report Status 09/07/2013 FINAL   Final   Organism ID, Bacteria ESCHERICHIA COLI   Final  CULTURE, BLOOD (ROUTINE X 2)     Status: None   Collection Time    09/05/13 11:15 PM      Result Value Ref Range Status   Specimen Description BLOOD RIGHT ANTECUBITAL   Final   Special Requests BOTTLES DRAWN AEROBIC ONLY 10CC   Final   Culture  Setup Time     Final   Value: 09/06/2013 03:28     Performed at [facility]   Culture     Final   Value:         BLOOD CULTURE RECEIVED NO GROWTH TO DATE CULTURE WILL BE HELD FOR 5 DAYS BEFORE ISSUING A FINAL NEGATIVE REPORT     Performed at [facility]   Report Status PENDING   Incomplete  CULTURE, BLOOD (ROUTINE X 2)     Status: None   Collection Time    09/05/13 11:20 PM      Result Value Ref Range Status   Specimen Description BLOOD RIGHT HAND   Final   Special Requests BOTTLES DRAWN AEROBIC  ONLY 10CC   Final   Culture  Setup Time     Final   Value: 09/06/2013 03:28     Performed at [facility]   Culture     Final   Value:        BLOOD CULTURE RECEIVED NO GROWTH TO DATE CULTURE WILL BE HELD FOR 5 DAYS BEFORE ISSUING A FINAL NEGATIVE REPORT     Performed at [facility]   Report Status PENDING   Incomplete  MRSA PCR SCREENING     Status: None   Collection Time    09/05/13 11:50 PM      Result Value Ref Range Status   MRSA by PCR NEGATIVE  NEGATIVE Final   Comment:            The GeneXpert MRSA Assay (FDA     approved for NASAL specimens     only), is one component of a     comprehensive MRSA colonization     surveillance program. It is not     intended to diagnose MRSA     infection nor to guide or     monitor treatment for     MRSA infections.  CLOSTRIDIUM DIFFICILE BY PCR     Status: None   Collection Time    09/10/13 11:21 PM      Result Value Ref Range Status   C difficile by pcr NEGATIVE  NEGATIVE Final     Studies: No results found.  Scheduled Meds: . aspirin EC  81 mg Oral Daily  . atorvastatin  40 mg Oral Daily  . carvedilol  6.25 mg Oral BID WC  . ceFEPime (MAXIPIME) IV  1 g Intravenous Q12H  . citalopram  40 mg Oral Daily  . colchicine  0.6 mg Oral BID  . doxazosin  8 mg Oral QHS  . enoxaparin (LOVENOX) injection  90 mg Subcutaneous Q12H  . furosemide  40 mg Oral Daily  . gabapentin  600 mg Oral TID  . OxyCODONE  20 mg Oral Q12H  . predniSONE  10 mg Oral Q breakfast  . tamsulosin  0.4 mg Oral QPC breakfast  . vitamin C  500 mg Oral  Daily   Continuous Infusions:   Principal Problem:   Fever and chills Active Problems:   Acute CHF (congestive heart failure)   HYPERTENSION   CAD in native artery   Lumbar degenerative disc disease   Cauda equina syndrome with neurogenic bladder   Altered mental status   Dyspnea   UTI (urinary tract infection)   Elevated troponin   Anemia, iron deficiency    Time spent: 40 mins    [facility] MD [facility] Pager [phone]. If 7PM-7AM, please contact night-coverage at [email or web address], password [facility] 09/11/2013, 11:45 AM  LOS: 6 days

## 2013-09-12 ENCOUNTER — Inpatient Hospital Stay (HOSPITAL_COMMUNITY): Payer: Medicare Other

## 2013-09-12 DIAGNOSIS — M25519 Pain in unspecified shoulder: Secondary | ICD-10-CM

## 2013-09-12 DIAGNOSIS — M25512 Pain in left shoulder: Secondary | ICD-10-CM | POA: Diagnosis not present

## 2013-09-12 LAB — BASIC METABOLIC PANEL
BUN: 22 mg/dL (ref 6–23)
CO2: 28 mEq/L (ref 19–32)
Calcium: 8.6 mg/dL (ref 8.4–10.5)
Chloride: 98 mEq/L (ref 96–112)
Creatinine, Ser: 1.25 mg/dL (ref 0.50–1.35)
GFR calc Af Amer: 66 mL/min — ABNORMAL LOW (ref >90)
GFR, EST NON AFRICAN AMERICAN: 57 mL/min — AB (ref >90)
GLUCOSE: 110 mg/dL — AB (ref 70–99)
Potassium: 4.3 mEq/L (ref 3.7–5.3)
SODIUM: 136 meq/L — AB (ref 137–147)

## 2013-09-12 LAB — CBC
HEMATOCRIT: 28.4 % — AB (ref 39.0–52.0)
HEMOGLOBIN: 9.4 g/dL — AB (ref 13.0–17.0)
MCH: 28.8 pg (ref 26.0–34.0)
MCHC: 33.1 g/dL (ref 30.0–36.0)
MCV: 87.1 fL (ref 78.0–100.0)
Platelets: 239 10*3/uL (ref 150–400)
RBC: 3.26 MIL/uL — ABNORMAL LOW (ref 4.22–5.81)
RDW: 15.6 % — ABNORMAL HIGH (ref 11.5–15.5)
WBC: 9.1 10*3/uL (ref 4.0–10.5)

## 2013-09-12 LAB — CULTURE, BLOOD (ROUTINE X 2)
CULTURE: NO GROWTH
Culture: NO GROWTH

## 2013-09-12 MED ORDER — ENOXAPARIN SODIUM 40 MG/0.4ML ~~LOC~~ SOLN
40.0000 mg | SUBCUTANEOUS | Status: DC
  Filled 2013-09-12: qty 0.4

## 2013-09-12 MED ORDER — LIDOCAINE HCL 1 % IJ SOLN
5.0000 mL | Freq: Once | INTRAMUSCULAR | Status: AC
  Administered 2013-09-12: 5 mL via INTRADERMAL
  Filled 2013-09-12: qty 5

## 2013-09-12 MED ORDER — HYDROMORPHONE HCL PF 1 MG/ML IJ SOLN: 1.0000 mg | INTRAMUSCULAR | Status: DC | PRN

## 2013-09-12 MED ORDER — LIDOCAINE HCL (PF) 1 % IJ SOLN
5.0000 mL | Freq: Once | INTRAMUSCULAR | Status: AC
  Administered 2013-09-12: 5 mL via INTRADERMAL
  Filled 2013-09-12 (×2): qty 5

## 2013-09-12 MED ORDER — HYDROMORPHONE HCL PF 1 MG/ML IJ SOLN
1.0000 mg | INTRAMUSCULAR | Status: DC | PRN
  Administered 2013-09-12 – 2013-09-20 (×27): 1 mg via INTRAVENOUS
  Filled 2013-09-12 (×27): qty 1

## 2013-09-12 MED ORDER — REGADENOSON 0.4 MG/5ML IV SOLN
0.4000 mg | Freq: Once | INTRAVENOUS | Status: DC
  Filled 2013-09-12: qty 5

## 2013-09-12 NOTE — Progress Notes (Signed)
Pt in Nuc Med Stress Lab.  Complains of left shoulder pain, 9 out of 10.  Left shoulder with limited range of movement, very difficult time under camera for first image.  Abnormal appearance.  Ward Givenshris Berge NP called.  Pt refusing stress images today due to pain.

## 2013-09-12 NOTE — Progress Notes (Signed)
[facility] CONSULT NOTE/ PROGRESS NOTE  [name] ZOX:[id number] DOB: 1941-06-13 DOA: 09/05/2013 PCP: Fredirick Maudlin, MD  Assessment/Plan: #1 acute combined systolic/diastolic CHF exacerbation Clinical improvement. Cardiac enzymes elevated. 2-D echo with EF of 45% with hypokinesis of the anteroseptal and mild mitral regurgitation.. Pro BNP is elevated. I/O = -12.454 L. Current weight is 90 kg. Continue oral Lasix. Elevated troponins felt to be secondary to acute CHF exacerbation per cardiology. Patient with no active chest pain. 2-D echo with a 45% with new hypokinesis of the anterioroseptum. Per cardiology.  #2 dyspnea Secondary to problem #1. Improved.  #3 fever/chills Likely secondary to Pseudomonas and/or Escherichia coli UTI related to bladder outlet obstruction or I D. Patient currently on IV cefepime. Per ID.  #4. Pseudomonas and Escherichia coli UTI On IV cefepime. Per infectious diseases.  #5 elevated troponins May be secondary to problem #1. Patient currently chest pain-free. 2-D echo with EF of 45% with new hypokinesis of the anterioroseptum and mild mitral regurgitation. Continue aspirin, Lipitor, Coreg, Lasix. Follow.  #6 probable soft tissue abscess in the lumbar region Per primary team. Continue current IV antibiotics as per ID recommendations.  #7 iron deficiency anemia Hemoglobin currently stable at 9.7. Anemia panel with iron level of 15 and a ferritin of 11. If patient has not had a colonoscopy done will likely benefit from one as outpatient. Will hold off on IV iron until patient recovers from acute infection. H&H stable. Follow.  #8 diarrhea C. difficile PCR negative. Imodium as needed.  #9 left shoulder pain Will get plain films of the left shoulder. Patient is currently afebrile with a normal white count. Patient on empiric IV cefepime. Will also consult with orthopedics for further evaluation and management.  Code Status: Full Family  Communication: Updated patient no family present. Disposition Plan: Per primary team   Consultants: [facility] : Dr. Lendell Caprice 09/07/2013  Infectious diseases: Dr. Orvan Falconer 09/06/2013  Cardiology Dr. Eldridge Dace 09/08/2013  Procedures:  Chest x-ray 09/07/2013, 09/05/2013  MRI of the L-spine 09/05/2013  2-D echo 09/08/13  Antibiotics:  IV cefepime 09/06/2013  IV Rocephin t2/9/15-->09/05/13  Oral Bactrim 09/05/2013 --> 09/06/2013  IV vancomycin 09/05/2013----> 09/05/2013  HPI/Subjective: Patient states his breathing has improved. Patient complaining of left shoulder pain.  Objective: Filed Vitals:   09/12/13 1445  BP: 186/72  Pulse: 62  Temp: 97.9 F (36.6 C)  Resp: 18    Intake/Output Summary (Last 24 hours) at 09/12/13 1739 Last data filed at 09/12/13 8119  Gross per 24 hour  Intake    120 ml  Output    990 ml  Net   -870 ml   Filed Weights   09/05/13 1946  Weight: 90 kg (198 lb 6.6 oz)    Exam:   General:  NAD  Cardiovascular: Regular rate rhythm no murmurs rubs or gallops.  Respiratory: CTAB anterior lung fields.  Abdomen: Soft, nontender, nondistended, positive bowel sounds  Musculoskeletal: No clubbing or cyanosis, no edema. Left shoulder tender to palpation with some swelling.    Data Reviewed: Basic Metabolic Panel:  Recent Labs Lab 09/08/13 0837 09/09/13 0515 09/10/13 0620 09/11/13 0430 09/12/13 0555  NA 137 139 142 141 136*  K 3.9 4.1 3.6* 3.9 4.3  CL 100 101 100 100 98  CO2 25 29 31 28 28   GLUCOSE 116* 85 88 90 110*  BUN 29* 28* 24* 25* 22  CREATININE 1.35 1.42* 1.26 1.31 1.25  CALCIUM 8.6 8.4 8.8 8.5 8.6   Liver Function Tests: No  results found for this basename: AST, ALT, ALKPHOS, BILITOT, PROT, ALBUMIN,  in the last 168 hours No results found for this basename: LIPASE, AMYLASE,  in the last 168 hours No results found for this basename: AMMONIA,  in the last 168 hours CBC:  Recent Labs Lab 09/07/13 1547  09/08/13 0837 09/09/13 0850 09/10/13 0830 09/12/13 0555  WBC 9.0 7.1 4.1 4.4 9.1  NEUTROABS 8.1*  --   --   --   --   HGB 8.4* 9.1* 9.1* 9.7* 9.4*  HCT 24.9* 27.7* 27.4* 29.1* 28.4*  MCV 87.7 87.9 86.7 87.1 87.1  PLT 129* 151 164 212 239   Cardiac Enzymes:  Recent Labs Lab 09/07/13 1547 09/07/13 1959 09/08/13 0448  TROPONINI 0.56* 0.55* 0.51*   BNP (last 3 results)  Recent Labs  09/08/13 0918 09/10/13 0435 09/11/13 0430  PROBNP 33476.0* 11292.0* 3379.0*   CBG: No results found for this basename: GLUCAP,  in the last 168 hours  Recent Results (from the past 240 hour(s))  URINE CULTURE     Status: None   Collection Time    09/05/13  1:35 PM      Result Value Ref Range Status   Specimen Description URINE, CATHETERIZED   Final   Special Requests NONE   Final   Culture  Setup Time     Final   Value: 09/05/2013 21:02     Performed at [facility] Count     Final   Value: >=100,000 COLONIES/ML     Performed at [facility]   Culture     Final   Value: ESCHERICHIA COLI     Performed at [facility]   Report Status 09/07/2013 FINAL   Final   Organism ID, Bacteria ESCHERICHIA COLI   Final  CULTURE, BLOOD (ROUTINE X 2)     Status: None   Collection Time    09/05/13 11:15 PM      Result Value Ref Range Status   Specimen Description BLOOD RIGHT ANTECUBITAL   Final   Special Requests BOTTLES DRAWN AEROBIC ONLY 10CC   Final   Culture  Setup Time     Final   Value: 09/06/2013 03:28     Performed at [facility]   Culture     Final   Value: NO GROWTH 5 DAYS     Performed at [facility]   Report Status 09/12/2013 FINAL   Final  CULTURE, BLOOD (ROUTINE X 2)     Status: None   Collection Time    09/05/13 11:20 PM      Result Value Ref Range Status   Specimen Description BLOOD RIGHT HAND   Final   Special Requests BOTTLES DRAWN AEROBIC ONLY 10CC   Final   Culture  Setup Time     Final   Value: 09/06/2013 03:28      Performed at [facility]   Culture     Final   Value: NO GROWTH 5 DAYS     Performed at [facility]   Report Status 09/12/2013 FINAL   Final  MRSA PCR SCREENING     Status: None   Collection Time    09/05/13 11:50 PM      Result Value Ref Range Status   MRSA by PCR NEGATIVE  NEGATIVE Final   Comment:            The GeneXpert MRSA Assay (FDA     approved for NASAL  specimens     only), is one component of a     comprehensive MRSA colonization     surveillance program. It is not     intended to diagnose MRSA     infection nor to guide or     monitor treatment for     MRSA infections.  CLOSTRIDIUM DIFFICILE BY PCR     Status: None   Collection Time    09/10/13 11:21 PM      Result Value Ref Range Status   C difficile by pcr NEGATIVE  NEGATIVE Final     Studies: Dg Shoulder Left  09/12/2013   CLINICAL DATA:  Left shoulder pain for 2 days, no acute injury  EXAM: LEFT SHOULDER - 2+ VIEW  COMPARISON:  Left shoulder films of 11/06/2010  FINDINGS: There is considerable spurring from the acromion resulting in impingement possible chronic rotator cuff disease. Degenerative spurring also is noted involving the left [facility] joint which may impinge as well. No acute bony abnormality is seen. The left shoulder joint space is only minimally narrowed.  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   Electronically Signed   By: Dwyane DeePaul  Barry M.D.   On: 09/12/2013 15:18    Scheduled Meds: . aspirin EC  81 mg Oral Daily  . atorvastatin  40 mg Oral Daily  . carvedilol  6.25 mg Oral BID WC  . ceFEPime (MAXIPIME) IV  1 g Intravenous Q12H  . citalopram  40 mg Oral Daily  . colchicine  0.6 mg Oral BID  . doxazosin  8 mg Oral QHS  . [START ON 09/13/2013] enoxaparin (LOVENOX) injection  40 mg Subcutaneous Q24H  . furosemide  40 mg Oral Daily  . gabapentin  600 mg Oral TID  . OxyCODONE  20 mg Oral Q12H  .  predniSONE  10 mg Oral Q breakfast  . regadenoson  0.4 mg Intravenous Once  . tamsulosin  0.4 mg Oral QPC breakfast  . vitamin C  500 mg Oral Daily   Continuous Infusions:   Principal Problem:   Cauda equina syndrome with neurogenic bladder Active Problems:   Acute CHF (congestive heart failure)   HYPERTENSION   CAD in native artery   Lumbar degenerative disc disease   Altered mental status   Fever and chills   Dyspnea   UTI (urinary tract infection)   Elevated troponin   Anemia, iron deficiency   Shoulder pain, left    Time spent: 40 mins    [facility] ClearforkHOMPSON,DANIEL MD [facility] Pager [phone]. If 7PM-7AM, please contact night-coverage at [email or web address], password [email or web address] 09/12/2013, 5:39 PM  LOS: 7 days

## 2013-09-12 NOTE — Progress Notes (Signed)
PT Cancellation Note  Patient Details Name: [name] MRN: [id number] DOB: 07/14/1941   Cancelled Treatment:    Reason Eval/Treat Not Completed: Patient at procedure or test/unavailable. Patient at procedure this morning and going for xray this afternoon. Will follow up tomorrow,    Fredrich BirksRobinette, [facility] 09/12/2013, 1:59 PM

## 2013-09-12 NOTE — Consult Note (Signed)
Reason for Consult:left shoulder pain and deformity Referring Physician: [name] is an 73 y.o. male.   Date of Admission: 09/05/2013 fever and altered mental status  Recurrent left shoulder pain.  Unable to complete nuch med test today due to shoulder pain.  Also noticed a "deformity" to the shoulder.    He was consulted on left shoulder pain at a recent hospital admission review of his record shows he has know rotator cuff tear treated conservatively.  Last seen in our office by Dr. Mardelle Matte in Jan 2011.  Also with failed rotator cuff repair on the contralateral right shoulder.  Since lumbar surgery has been confined to bed or wheelchair with 2 person assist per nursing report.  Denies new injury or tramua states the last couple days has had intense pain inability to lift the left arm.  When last consulted believed to have exacerbation of pain due to overuse with transfers and elected to inject with corticosteroids which he did receive some temporary relief.    Patient with history multiple infections including MRSA postoperatively.  Likely current UTI and probable lumbar abscess.    HPI: [name] is a 73 y.o. male with spinal stenosis and degenerative disc disease who underwent lumbar surgery in November. It was complicated by cauda equina syndrome and CSF leak. He was readmitted to the hospital in early January with severe, increased back pain. MRI revealed fluid in the operative bed. He underwent incision and drainage. Operative Gram stain and cultures were negative but he was on antibiotics prior to surgery.  Has been on po abx TMPSMX and Rifampin, recently d/c Rifampin.  Followed by Infectious Disease Team.  He has had bladder outlet obstruction and has been requiring self-catheterization at home. Cultures positive for pseudomonas and e coli since admission.   Also with elevated Troponins and BNP, hx of stents in 2012.  Possible fluid overload.  Followed by Cardiology and  Medicine teams.     Past Medical History  Diagnosis Date  . Hyperlipidemia   . Small bowel problem     HAD ALOT OF SCAR TISSUE FROM PREVIOUS SURGERIES..NG WAS INSERTED ...Marland KitchenMarland KitchenNO SURGERY NEEDED...Marland KitchenMarland KitchenIN FOR 8 DAYS  . Disorder of blood     BEEN TREATED BY DERMATOLOGIST X 4 YRS..."BLOOD BLISTERS"  . Arthritis   . Gout   . Anxiety   . Hx MRSA infection     rt shoulder  . Pneumonia     "I've had it 3-4 times"  . Coronary artery disease     IN 2000   STENT PLACED IN 2012 sees Dr. Lattie Haw, saw last 2013  . HTN (hypertension)     sees Dr. [name] in [place]  . Small bowel obstruction   . Ventral hernia   . Acute renal failure 04/17/2013  . 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 Anemia, iron deficiency 09/09/2013    Past Surgical History  Procedure Laterality Date  . Sternal surg  2000    HAS HAD 5-6 ON HIS STERNUM; "caught MRSA in it"  . Lumbar laminectomy/decompression microdiscectomy  06/13/2011    Procedure: LUMBAR LAMINECTOMY/DECOMPRESSION MICRODISCECTOMY;  Surgeon: Floyce Stakes;  Location: [place] [facility];  Service: Neurosurgery;  Laterality: N/A;  Lumbar three, lumbar four-five Laminectomy  . Eye surgery      bilateral cataract  . Anterior cervical corpectomy  12/17/11  . Incision and drainage of wound  ~ 20ll; 12/03/11    "had infection in my right"  .  Shoulder open rotator cuff repair  ~ 2011    right  . Peripherally inserted central catheter insertion  2011 & 11/2011  . Cataract extraction w/ intraocular lens  implant, bilateral  ? 2011  . Coronary artery bypass graft  2000    CABG X5  . Back surgery      lumbar  . Tonsillectomy  1949  . Cholecystectomy  2006 "or after"  . Coronary angioplasty with stent placement  2012  . Anterior cervical corpectomy  12/17/2011    Procedure: ANTERIOR CERVICAL CORPECTOMY;  Surgeon: Floyce Stakes, MD;  Location: [place] [facility];  Service: Neurosurgery;  Laterality: N/A;  Anterior Cervical Decompression Fusion Five to  Thoracic Two with plating  . Lumbar laminectomy/decompression microdiscectomy Right 06/17/2013    Procedure: LUMBAR LAMINECTOMY/DECOMPRESSION MICRODISCECTOMY 1 LEVEL;  Surgeon: Floyce Stakes, MD;  Location: [place] [facility];  Service: Neurosurgery;  Laterality: Right;  Right L3-4 Microdiskectomy  . Lumbar wound debridement N/A 08/02/2013    Procedure: INCISION AND DRAINAGE OF LUMBAR WOUND DEBRIDEMENT;  Surgeon: Floyce Stakes, MD;  Location: [place] [facility];  Service: Neurosurgery;  Laterality: N/A;    Family History  Problem Relation Age of Onset  . Heart disease    . Hypertension Mother   . Hypertension Maternal Aunt   . Hypertension Maternal Uncle   . Anesthesia problems Neg Hx   . Hypotension Neg Hx   . Malignant hyperthermia Neg Hx   . Pseudochol deficiency Neg Hx     Social History:  reports that he quit smoking about 14 years ago. His smoking use included Cigarettes. He has a 1 pack-year smoking history. He has never used smokeless tobacco. He reports that he does not drink alcohol or use illicit drugs.  Allergies:  Allergies  Allergen Reactions  . Morphine Other (See Comments)    REACTION: made him go crazy; "I had fun w/it; my family didn't think it was too funny"  . Metoprolol Other (See Comments)    REACTION:  Tachycardia; "don't remember how bad it was; it was so long ago"  . Rifampin     Itch     Medications: I have reviewed the patient's current medications.  Results for orders placed during the hospital encounter of 09/05/13 (from the past 48 hour(s))  CLOSTRIDIUM DIFFICILE BY PCR     Status: None   Collection Time    09/10/13 11:21 PM      Result Value Ref Range   C difficile by pcr NEGATIVE  NEGATIVE  BASIC METABOLIC PANEL     Status: Abnormal   Collection Time    09/11/13  4:30 AM      Result Value Ref Range   Sodium 141  137 - 147 mEq/L   Potassium 3.9  3.7 - 5.3 mEq/L   Chloride 100  96 - 112 mEq/L   CO2 28  19 - 32 mEq/L   Glucose, Bld 90  70 - 99  mg/dL   BUN 25 (*) 6 - 23 mg/dL   Creatinine, Ser 1.31  0.50 - 1.35 mg/dL   Calcium 8.5  8.4 - 10.5 mg/dL   GFR calc non Af Amer 54 (*) >90 mL/min   GFR calc Af Amer 62 (*) >90 mL/min   Comment: (NOTE)     The eGFR has been calculated using the CKD EPI equation.     This calculation has not been validated in all clinical situations.     eGFR's persistently <90 mL/min signify possible  Chronic Kidney     Disease.  PRO B NATRIURETIC PEPTIDE     Status: Abnormal   Collection Time    09/11/13  4:30 AM      Result Value Ref Range   Pro B Natriuretic peptide (BNP) 3379.0 (*) 0 - 125 pg/mL  CBC     Status: Abnormal   Collection Time    09/12/13  5:55 AM      Result Value Ref Range   WBC 9.1  4.0 - 10.5 K/uL   RBC 3.26 (*) 4.22 - 5.81 MIL/uL   Hemoglobin 9.4 (*) 13.0 - 17.0 g/dL   HCT 28.4 (*) 39.0 - 52.0 %   MCV 87.1  78.0 - 100.0 fL   MCH 28.8  26.0 - 34.0 pg   MCHC 33.1  30.0 - 36.0 g/dL   RDW 15.6 (*) 11.5 - 15.5 %   Platelets 239  150 - 400 K/uL  BASIC METABOLIC PANEL     Status: Abnormal   Collection Time    09/12/13  5:55 AM      Result Value Ref Range   Sodium 136 (*) 137 - 147 mEq/L   Potassium 4.3  3.7 - 5.3 mEq/L   Chloride 98  96 - 112 mEq/L   CO2 28  19 - 32 mEq/L   Glucose, Bld 110 (*) 70 - 99 mg/dL   BUN 22  6 - 23 mg/dL   Creatinine, Ser 1.25  0.50 - 1.35 mg/dL   Calcium 8.6  8.4 - 10.5 mg/dL   GFR calc non Af Amer 57 (*) >90 mL/min   GFR calc Af Amer 66 (*) >90 mL/min   Comment: (NOTE)     The eGFR has been calculated using the CKD EPI equation.     This calculation has not been validated in all clinical situations.     eGFR's persistently <90 mL/min signify possible Chronic Kidney     Disease.    Dg Shoulder Left  09/12/2013   CLINICAL DATA:  Left shoulder pain for 2 days, no acute injury  EXAM: LEFT SHOULDER - 2+ VIEW  COMPARISON:  Left shoulder films of 11/06/2010  FINDINGS: There is considerable spurring from the acromion resulting in impingement  possible chronic rotator cuff disease. Degenerative spurring also is noted involving the left [facility] joint which may impinge as well. No acute bony abnormality is seen. The left shoulder joint space is only minimally narrowed.  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   Electronically Signed   By: Ivar Drape M.D.   On: 09/12/2013 15:18    Review of Systems  Constitutional: Positive for fever, weight loss and malaise/fatigue.  HENT: Negative for congestion and sore throat.   Eyes: Negative for pain, discharge and redness.  Cardiovascular: Negative for chest pain and palpitations.  Gastrointestinal: Negative for nausea, vomiting and abdominal pain.  Genitourinary:       Suprapubic pain  Musculoskeletal: Positive for joint pain. Negative for falls.  Skin: Negative for itching and rash.  Neurological: Positive for focal weakness and weakness.   Blood pressure 186/72, pulse 62, temperature 97.9 F (36.6 C), temperature source Oral, resp. rate 18, height 6' (1.829 m), weight 90 kg (198 lb 6.6 oz), SpO2 96.00%. Physical Exam  Constitutional: He appears cachectic. He is cooperative. He appears ill. No distress.  HENT:  Head: Normocephalic and atraumatic.  Nose: Nose normal.  Eyes: Conjunctivae and EOM are normal.  Neck:  Neck supple.  Cardiovascular: Normal rate and intact distal pulses.   Respiratory: Effort normal. No respiratory distress.  GI: Soft.  Musculoskeletal:       Left shoulder: He exhibits decreased range of motion, tenderness, swelling, effusion and pain.  General exam left shoulder shows large amount of swelling in deltoid and anterior shoulder distribution.  There is mild erythema, warmth, and fluctuance noted in the involved area appears superficial.  Patient with gaurding unable to lift arm more than 15 degrees actively much different than exam one month ago.  Significant tenderness to palpation  over the fluctuant area.  Severe pain with any attempt at passive motion of the shoulder.  Neurological: He is alert.  Skin: Skin is warm and dry. No rash noted. No erythema. There is pallor.  Psychiatric: He has a normal mood and affect. His behavior is normal.    Assessment/Plan: Known cuff disease left shoulder.  Possible infected left shoulder  Discussed aspiration left shoulder to try and obtain sample for culture/gram stain/crystals/cell count.  Risks and benefits discussed patient wishes to proceed see procedure note below.  Unable to obtain any purulent drainage or sample.  Recommend stat MRI left shoulder to determine any possible fluid collection or possible infection to determine need for surgical intervention.  Will make him NPO after midnight in anticipation of results.  Will need to discuss with medicine/cardiology to determine surgical risk if positive result.  Will continue to follow closely and await MRI results.   Procedure:  Left shoulder aspiration  Patient was supine in bed there was a large fluctuant area palpated over the left deltoid and over the [facility] joint.  Area cleaned and prepped with Betadine swab using a 23gauge needle skin injected locally using plain 1% lidocaine 66ml.  Skin reprepped with betadine and using an 18gauge needle aspirated approx 30ml of bloody fluid.  Specimen was not sent to lab.  Patient tolerated procedure well without complication.      Chriss Czar 09/12/2013, 6:52 PM

## 2013-09-12 NOTE — Progress Notes (Addendum)
Patient Name: [name] Date of Encounter: 09/12/2013     Principal Problem:   Cauda equina syndrome with neurogenic bladder Active Problems:   Acute CHF (congestive heart failure)   CAD in native artery   Lumbar degenerative disc disease   Fever and chills   Dyspnea   Elevated troponin   HYPERTENSION   Altered mental status   UTI (urinary tract infection)   Anemia, iron deficiency    SUBJECTIVE  No chest pain or sob.  C/O left shoulder pain r/t chronic bursitis.  For cardiolite today.  CURRENT MEDS . aspirin EC  81 mg Oral Daily  . atorvastatin  40 mg Oral Daily  . carvedilol  6.25 mg Oral BID WC  . ceFEPime (MAXIPIME) IV  1 g Intravenous Q12H  . citalopram  40 mg Oral Daily  . colchicine  0.6 mg Oral BID  . doxazosin  8 mg Oral QHS  . enoxaparin (LOVENOX) injection  90 mg Subcutaneous Q12H  . furosemide  40 mg Oral Daily  . gabapentin  600 mg Oral TID  . OxyCODONE  20 mg Oral Q12H  . predniSONE  10 mg Oral Q breakfast  . tamsulosin  0.4 mg Oral QPC breakfast  . vitamin C  500 mg Oral Daily    OBJECTIVE  Filed Vitals:   09/11/13 1835 09/11/13 2159 09/12/13 0110 09/12/13 0612  BP: 135/48 140/55 138/53 136/57  Pulse: 61 57 59 56  Temp: 98.5 F (36.9 C) 98.3 F (36.8 C) 98.5 F (36.9 C) 97.8 F (36.6 C)  TempSrc: Oral Oral Oral Oral  Resp: 18 18 18 18   Height:      Weight:      SpO2: 99% 98% 97% 95%    Intake/Output Summary (Last 24 hours) at 09/12/13 0934 Last data filed at 09/12/13 1610  Gross per 24 hour  Intake    470 ml  Output   1790 ml  Net  -1320 ml   Filed Weights   09/05/13 1946  Weight: 198 lb 6.6 oz (90 kg)    PHYSICAL EXAM  General: Pleasant, NAD. Neuro: Alert and oriented X 3. Moves all extremities spontaneously. Psych: Normal affect. HEENT:  Normal  Neck: Supple without bruits or JVD. Lungs:  Resp regular and unlabored, CTA. Heart: RRR no s3, s4, or murmurs. Abdomen: Soft, non-tender, non-distended, BS + x 4.    Extremities: No clubbing, cyanosis or edema. DP/PT/Radials 2+ and equal bilaterally.  Accessory Clinical Findings  CBC  Recent Labs  09/10/13 0830 09/12/13 0555  WBC 4.4 9.1  HGB 9.7* 9.4*  HCT 29.1* 28.4*  MCV 87.1 87.1  PLT 212 239   Basic Metabolic Panel  Recent Labs  09/11/13 0430 09/12/13 0555  NA 141 136*  K 3.9 4.3  CL 100 98  CO2 28 28  GLUCOSE 90 110*  BUN 25* 22  CREATININE 1.31 1.25  CALCIUM 8.5 8.6   Thyroid Function Tests Lab Results  Component Value Date   TSH 2.362 09/07/2013     TELE  Seen in nuc med.  Radiology/Studies  Dg Chest 1 View  09/07/2013   CLINICAL DATA:  Increasing shortness of breath, being treated for infection  EXAM: CHEST - 1 VIEW  COMPARISON:  09/05/2013  FINDINGS: Status post CABG with mild stable cardiac enlargement. Moderate to severe vascular congestion. Decreased interstitial prominence. Small bilateral pleural effusions.  IMPRESSION: Stable vascular congestion with improving pulmonary edema.   Electronically Signed   By: Esperanza Heir  M.D.   On: 09/07/2013 14:03   ASSESSMENT AND PLAN  1.  Acute systolic CHF:  EF 45% by echo this admission.  Breathing/volume stable.  -19L for this admission.  Hasn't been weighed since the 9th (198 lbs).  Cont coreg and PO lasix.  Consider ACEI.  For cardiolite today to r/o ischemia as cause of EF reduction/troponin elevation (peak of 0.56, flat trend).  2.  CAD: no chest pain.  Cont asa, statin, bb.  **Addendum:  Pt had significant L shoulder pain with rest imaging (positioning).  He does not wish to have any additional imaging today and has requested to be transported back to the floor.  Defer w/u of shoulder pain to IM.**  3.  HTN:  Stable.  Consider ACEI in setting of #1.  4.  UTI:  Per IM.  5.  Cauda Equina:  Per NSU/IM.  6.  Normocytic Anemia:  Stable.  Signed, Nicolasa Duckinghristopher Berge NP  I have examined the patient and reviewed assessment and plan and discussed with patient.   Agree with above as stated.  Severe left shoulder pain with some swelling.  Xray pending.  Unable to lie flat for stress part of nuclear test.  Rest images done today.  Will get the stress portion done when he can lie flat comfortably.  Heart failure sx seem compensated. Euvolemic.  Renal function stable.  Decrease lovenox.  He may need left shoulder aspiration.  Also, he is anemic.  Lauranne Beyersdorf S.

## 2013-09-12 NOTE — Progress Notes (Signed)
Patient was seen by GU last Friday. He is a patient of dr Jacquelyne BalintMcdermott. Today he is complaining of pain in the left shoulderl Clinically he has swelling with the possibility of fluid collection on it. Ortho to see and help us. On iv ABS. HE is going to get me some literature about the self-cath he wants to use at home and will find out if his insurance cover for it

## 2013-09-13 ENCOUNTER — Inpatient Hospital Stay (HOSPITAL_COMMUNITY): Payer: Medicare Other | Admitting: Anesthesiology

## 2013-09-13 ENCOUNTER — Encounter (HOSPITAL_COMMUNITY): Payer: Medicare Other | Admitting: Anesthesiology

## 2013-09-13 ENCOUNTER — Encounter (HOSPITAL_COMMUNITY)
Admission: EM | Disposition: A | Discharge status: Skilled Nursing Facility | Payer: Self-pay | Source: Home / Self Care | Attending: Neurosurgery

## 2013-09-13 ENCOUNTER — Inpatient Hospital Stay (HOSPITAL_COMMUNITY): Payer: Medicare Other

## 2013-09-13 HISTORY — PX: SHOULDER ARTHROSCOPY: SHX128

## 2013-09-13 LAB — BASIC METABOLIC PANEL
BUN: 24 mg/dL — AB (ref 6–23)
CO2: 27 mEq/L (ref 19–32)
CREATININE: 1.3 mg/dL (ref 0.50–1.35)
Calcium: 8.9 mg/dL (ref 8.4–10.5)
Chloride: 95 mEq/L — ABNORMAL LOW (ref 96–112)
GFR, EST AFRICAN AMERICAN: 63 mL/min — AB (ref >90)
GFR, EST NON AFRICAN AMERICAN: 54 mL/min — AB (ref >90)
Glucose, Bld: 99 mg/dL (ref 70–99)
Potassium: 5 mEq/L (ref 3.7–5.3)
Sodium: 134 mEq/L — ABNORMAL LOW (ref 137–147)

## 2013-09-13 LAB — CBC
HCT: 25.9 % — ABNORMAL LOW (ref 39.0–52.0)
Hemoglobin: 8.8 g/dL — ABNORMAL LOW (ref 13.0–17.0)
MCH: 29.5 pg (ref 26.0–34.0)
MCHC: 34 g/dL (ref 30.0–36.0)
MCV: 86.9 fL (ref 78.0–100.0)
Platelets: 235 10*3/uL (ref 150–400)
RBC: 2.98 MIL/uL — ABNORMAL LOW (ref 4.22–5.81)
RDW: 15.6 % — AB (ref 11.5–15.5)
WBC: 7.8 10*3/uL (ref 4.0–10.5)

## 2013-09-13 LAB — TYPE AND SCREEN
ABO/RH(D): O POS
Antibody Screen: NEGATIVE

## 2013-09-13 LAB — URIC ACID: Uric Acid, Serum: 8.1 mg/dL — ABNORMAL HIGH (ref 4.0–7.8)

## 2013-09-13 SURGERY — ARTHROSCOPY, SHOULDER
Anesthesia: General | Site: Shoulder | Laterality: Left

## 2013-09-13 MED ORDER — CHLORHEXIDINE GLUCONATE 4 % EX LIQD
60.0000 mL | Freq: Once | CUTANEOUS | Status: DC
  Filled 2013-09-13: qty 60

## 2013-09-13 MED ORDER — HYDROMORPHONE HCL PF 1 MG/ML IJ SOLN: 0.2500 mg | INTRAMUSCULAR | Status: DC | PRN

## 2013-09-13 MED ORDER — PROPOFOL 10 MG/ML IV BOLUS
INTRAVENOUS | Status: DC | PRN
  Administered 2013-09-13: 120 mg via INTRAVENOUS

## 2013-09-13 MED ORDER — GLYCOPYRROLATE 0.2 MG/ML IJ SOLN
INTRAMUSCULAR | Status: DC | PRN
  Administered 2013-09-13: .4 mg via INTRAVENOUS

## 2013-09-13 MED ORDER — ROCURONIUM BROMIDE 100 MG/10ML IV SOLN
INTRAVENOUS | Status: DC | PRN
  Administered 2013-09-13: 40 mg via INTRAVENOUS

## 2013-09-13 MED ORDER — NEOSTIGMINE METHYLSULFATE 1 MG/ML IJ SOLN
INTRAMUSCULAR | Status: AC
  Filled 2013-09-13: qty 10

## 2013-09-13 MED ORDER — FENTANYL CITRATE 0.05 MG/ML IJ SOLN
50.0000 ug | Freq: Once | INTRAMUSCULAR | Status: DC
Start: 2013-09-13 — End: 2013-09-13

## 2013-09-13 MED ORDER — SODIUM CHLORIDE 0.9 % IR SOLN
Status: DC | PRN
  Administered 2013-09-13: 6000 mL

## 2013-09-13 MED ORDER — ROCURONIUM BROMIDE 50 MG/5ML IV SOLN
INTRAVENOUS | Status: AC
  Filled 2013-09-13: qty 1

## 2013-09-13 MED ORDER — ENOXAPARIN SODIUM 40 MG/0.4ML ~~LOC~~ SOLN
40.0000 mg | SUBCUTANEOUS | Status: DC
  Administered 2013-09-14 – 2013-09-20 (×7): 40 mg via SUBCUTANEOUS
  Filled 2013-09-13 (×7): qty 0.4

## 2013-09-13 MED ORDER — FENTANYL CITRATE 0.05 MG/ML IJ SOLN
INTRAMUSCULAR | Status: AC
  Filled 2013-09-13: qty 5

## 2013-09-13 MED ORDER — OXYCODONE HCL 5 MG/5ML PO SOLN: 5.0000 mg | Freq: Once | ORAL | Status: DC | PRN

## 2013-09-13 MED ORDER — LIDOCAINE HCL (CARDIAC) 20 MG/ML IV SOLN
INTRAVENOUS | Status: AC
  Filled 2013-09-13: qty 5

## 2013-09-13 MED ORDER — GLYCOPYRROLATE 0.2 MG/ML IJ SOLN
INTRAMUSCULAR | Status: AC
  Filled 2013-09-13: qty 1

## 2013-09-13 MED ORDER — ONDANSETRON HCL 4 MG/2ML IJ SOLN
INTRAMUSCULAR | Status: DC | PRN
  Administered 2013-09-13: 4 mg via INTRAVENOUS

## 2013-09-13 MED ORDER — OXYCODONE HCL 5 MG PO TABS: 5.0000 mg | ORAL_TABLET | Freq: Once | ORAL | Status: DC | PRN

## 2013-09-13 MED ORDER — NEOSTIGMINE METHYLSULFATE 1 MG/ML IJ SOLN
INTRAMUSCULAR | Status: DC | PRN
  Administered 2013-09-13: 3 mg via INTRAVENOUS

## 2013-09-13 MED ORDER — MIDAZOLAM HCL 2 MG/2ML IJ SOLN: 1.0000 mg | INTRAMUSCULAR | Status: DC | PRN

## 2013-09-13 MED ORDER — PROPOFOL 10 MG/ML IV BOLUS
INTRAVENOUS | Status: AC
  Filled 2013-09-13: qty 20

## 2013-09-13 MED ORDER — LACTATED RINGERS IV SOLN
INTRAVENOUS | Status: DC | PRN
  Administered 2013-09-13 (×2): via INTRAVENOUS

## 2013-09-13 MED ORDER — LIDOCAINE HCL (CARDIAC) 20 MG/ML IV SOLN
INTRAVENOUS | Status: DC | PRN
  Administered 2013-09-13: 100 mg via INTRAVENOUS

## 2013-09-13 MED ORDER — PHENYLEPHRINE HCL 10 MG/ML IJ SOLN
10.0000 mg | INTRAVENOUS | Status: DC | PRN
  Administered 2013-09-13: 20 ug/min via INTRAVENOUS

## 2013-09-13 MED ORDER — FENTANYL CITRATE 0.05 MG/ML IJ SOLN
INTRAMUSCULAR | Status: DC | PRN
  Administered 2013-09-13: 50 ug via INTRAVENOUS

## 2013-09-13 MED ORDER — ONDANSETRON HCL 4 MG/2ML IJ SOLN
INTRAMUSCULAR | Status: AC
  Filled 2013-09-13: qty 2

## 2013-09-13 SURGICAL SUPPLY — 45 items
BIT DRILL TAK (DRILL) IMPLANT
BLADE CUDA 5.5 (BLADE) IMPLANT
BLADE GREAT WHITE 4.2 (BLADE) ×4 IMPLANT
BLADE GREAT WHITE 4.2MM (BLADE) ×2
BLADE SURG 11 STRL SS (BLADE) ×3 IMPLANT
BUR OVAL 6.0 (BURR) IMPLANT
CANNULA SHOULDER 7CM (CANNULA) ×6 IMPLANT
CLOTH BEACON ORANGE TIMEOUT ST (SAFETY) IMPLANT
DRAPE STERI 35X30 U-POUCH (DRAPES) ×3 IMPLANT
DRAPE SURG 17X23 STRL (DRAPES) ×3 IMPLANT
DRAPE U-SHAPE 47X51 STRL (DRAPES) ×3 IMPLANT
DRILL TAK (DRILL)
DRSG ADAPTIC 3X8 NADH LF (GAUZE/BANDAGES/DRESSINGS) ×3 IMPLANT
DRSG PAD ABDOMINAL 8X10 ST (GAUZE/BANDAGES/DRESSINGS) ×6 IMPLANT
DURAPREP 26ML APPLICATOR (WOUND CARE) ×3 IMPLANT
GLOVE BIOGEL PI IND STRL 8 (GLOVE) ×2 IMPLANT
GLOVE BIOGEL PI INDICATOR 8 (GLOVE) ×4
GLOVE ORTHO TXT STRL SZ7.5 (GLOVE) ×6 IMPLANT
GLOVE SURG ORTHO 8.0 STRL STRW (GLOVE) ×9 IMPLANT
GOWN PREVENTION PLUS XLARGE (GOWN DISPOSABLE) ×6 IMPLANT
GOWN STRL REIN XL XLG (GOWN DISPOSABLE) ×3 IMPLANT
KIT BASIN OR (CUSTOM PROCEDURE TRAY) ×3 IMPLANT
KIT ROOM TURNOVER OR (KITS) ×3 IMPLANT
MANIFOLD NEPTUNE II (INSTRUMENTS) ×3 IMPLANT
NEEDLE 22X1 1/2 (OR ONLY) (NEEDLE) ×3 IMPLANT
NEEDLE SPNL 18GX3.5 QUINCKE PK (NEEDLE) ×3 IMPLANT
NS IRRIG 1000ML POUR BTL (IV SOLUTION) ×3 IMPLANT
PACK SHOULDER (CUSTOM PROCEDURE TRAY) ×3 IMPLANT
PAD ABD 8X10 STRL (GAUZE/BANDAGES/DRESSINGS) ×3 IMPLANT
PAD ARMBOARD 7.5X6 YLW CONV (MISCELLANEOUS) ×6 IMPLANT
SET ARTHROSCOPY TUBING (MISCELLANEOUS) ×2
SET ARTHROSCOPY TUBING LN (MISCELLANEOUS) ×1 IMPLANT
SLING ARM FOAM STRAP XLG (SOFTGOODS) ×3 IMPLANT
SPEAR FASTAKII (SLEEVE) IMPLANT
SPONGE GAUZE 4X4 12PLY (GAUZE/BANDAGES/DRESSINGS) ×6 IMPLANT
SPONGE GAUZE 4X4 12PLY STER LF (GAUZE/BANDAGES/DRESSINGS) ×3 IMPLANT
SPONGE LAP 4X18 X RAY DECT (DISPOSABLE) IMPLANT
SUT ETHILON 3 0 PS 1 (SUTURE) ×3 IMPLANT
SYR 20ML ECCENTRIC (SYRINGE) ×3 IMPLANT
SYR CONTROL 10ML LL (SYRINGE) ×3 IMPLANT
TAPE CLOTH SURG 6X10 WHT LF (GAUZE/BANDAGES/DRESSINGS) ×3 IMPLANT
TOWEL OR 17X24 6PK STRL BLUE (TOWEL DISPOSABLE) ×3 IMPLANT
TOWEL OR 17X26 10 PK STRL BLUE (TOWEL DISPOSABLE) ×3 IMPLANT
WAND 90 DEG TURBOVAC W/CORD (SURGICAL WAND) IMPLANT
WATER STERILE IRR 1000ML POUR (IV SOLUTION) ×3 IMPLANT

## 2013-09-13 NOTE — Progress Notes (Signed)
PT Cancellation Note  Patient Details Name: Ignacia MarvelRonald W Konigsberg MRN: [id number] DOB: 11/20/1940   Cancelled Treatment:    Reason Eval/Treat Not Completed: Patient at procedure or test/unavailable. Patient scheduled for surgery this afternoon. Will hold PT and follow up as appropriate.    Fredrich BirksRobinette, Julia Elizabeth 09/13/2013, 10:08 AM

## 2013-09-13 NOTE — Op Note (Signed)
NAMAlphonzo Dublin:  Rock, Kidus             ACCOUNT NO.:  [id number]  MEDICAL RECORD NO.:  [id number]  LOCATION:  [id number]                        FACILITY:  [facility]  PHYSICIAN:  Dyke BrackettW. D. Euretha Najarro, M.D.    DATE OF BIRTH:  1940/11/04  DATE OF PROCEDURE: DATE OF DISCHARGE:                              OPERATIVE REPORT   PREOPERATIVE DIAGNOSES: 1. Massive rotator cuff tear. 2. Large hemorrhagic effusion, left shoulder.  POSTOPERATIVE DIAGNOSES: 1. Massive rotator cuff tear. 2. Large hemorrhagic effusion, left shoulder.  INDICATIONS:  A 73 year old, followed for intractable shoulder pain with history of staph infection elsewhere, developed more increasingly severe pain leading to an MRI, which showed complex effusion of his shoulder, all with the degree of immunosuppression from the antibiotics, etc., that cultures would not be, but so valid and based on the severity of his pain, I and D was indicated, accomplished with left shoulder arthroscopically.  OPERATION:  Arthroscopic debridement, extensive, with subtotal synovectomy all for the left shoulder.  SURGEON:  Dyke BrackettW. D. Orly Quimby, M.D.  ANESTHESIA:  General anesthetic.  BLOOD LOSS:  Minimal.  DESCRIPTION OF PROCEDURE:  Posterior and anterior portals were made medially into the shoulder.  We evacuated a large amount of hemorrhagic fluid.  Due to the degree of blood content, it was difficult to say whether there was any purulent material, but it did not appear grossly purulent.  We did send it for culture, aerobic and anaerobic, and operative deep cultures were sent.  There was subcutaneous involvement with the hemorrhage as well and due to the fact that the patient had a massive cuff tear with retraction to the glenoid noted.  Glenohumeral joint surfaces looked moderately degenerative, glenoid showing cobblestoning with grade 2 and 3 changes.  More extensive grade 3 with some areas of grade 4 changes on the humeral head.  Then, we evacuated the  hematoma, lavaged this with 6000 mL. Then, closed the incisions with interrupted nylon.  Lightly compressive sterile dressing shoulder sling applied.  Taken to the recovery room in stable condition.     Dyke BrackettW. D. Felita Bump, M.D.     WDC/MEDQ  D:  09/13/2013  T:  09/13/2013  Job:  [id number]

## 2013-09-13 NOTE — Transfer of Care (Signed)
Immediate Anesthesia Transfer of Care Note  Patient: [name]  Procedure(s) Performed: Procedure(s): ARTHROSCOPIC IRRIGATION AND DEBRIDEMENT, SYNOVECTOMY ,  LEFT SHOULDER  (Left)  Patient Location: PACU  Anesthesia Type:General  Level of Consciousness: awake, alert , sedated and patient cooperative  Airway & Oxygen Therapy: Patient Spontanous Breathing and Patient connected to face mask oxygen  Post-op Assessment: Report given to PACU RN, Post -op Vital signs reviewed and stable and Patient moving all extremities  Post vital signs: Reviewed and stable  Complications: No apparent anesthesia complications

## 2013-09-13 NOTE — Progress Notes (Signed)
INITIAL NUTRITION ASSESSMENT  DOCUMENTATION CODES Per approved criteria  -Not Applicable   INTERVENTION: Diet advancement per MD discretion Provide Ensure Complete BID when diet advanced Provide Multivitamin with minerals daily when diet advanced RD to continue to monitor  NUTRITION DIAGNOSIS: Inadequate oral intake related to decreased appetite as evidenced by varied PO intake 10-100% of meals.   Goal: Pt to meet >/= 90% of their estimated nutrition needs   Monitor:  Diet advancement/PO intake, weight, labs  Reason for Assessment: Malnutrition Screening Tool, score of 3  73 y.o. male  Admitting Dx: Cauda equina syndrome with neurogenic bladder  ASSESSMENT: 73 y.o. male with spinal stenosis and degenerative disc disease who underwent lumbar surgery in November. It was complicated by cauda equina syndrome and CSF leak. He was readmitted to the hospital in early January with severe, increased back pain. MRI revealed fluid in the operative bed. He underwent incision and drainage. Pt presented 2/9 with complaints shakeness, diaphoresis, decrease of loc, increase ot temperature.  Pt being transported out of room for surgery at time of visit. Pt re-screened with malnutrition screening tool (MST) and pt reported decreased appetite and unintentional weight loss. Per nursing notes pt's meal completion has varied from 10% to 100% of meals since admission; 50-75% of most meals. Per weight history pt's weight dropped to 179 lbs in January but, weight has been trending up since. Pt with stage 2 pressure ulcer on sacrum per nursing notes.    Height: Ht Readings from Last 1 Encounters:  09/05/13 6' (1.829 m)    Weight: Wt Readings from Last 1 Encounters:  09/05/13 198 lb 6.6 oz (90 kg)    Ideal Body Weight: 178 lbs  % Ideal Body Weight: 111%  Wt Readings from Last 10 Encounters:  09/05/13 198 lb 6.6 oz (90 kg)  09/05/13 198 lb 6.6 oz (90 kg)  09/02/13 190 lb (86.183 kg)  08/30/13  190 lb (86.183 kg)  08/08/13 179 lb 14.4 oz (81.602 kg)  07/30/13 179 lb 14.4 oz (81.602 kg)  07/30/13 179 lb 14.4 oz (81.602 kg)  07/06/13 185 lb (83.915 kg)  06/17/13 190 lb (86.183 kg)  06/17/13 190 lb (86.183 kg)    Usual Body Weight: unknown  % Usual Body Weight: NA  BMI:  Body mass index is 26.9 kg/(m^2).  Estimated Nutritional Needs: Kcal: 2100-2300 Protein: 105-115 grams Fluid: 2.1-2.3 L/day  Skin: +1 RLE and LLE edema; stage 2 pressure ulcer on sacrum  Diet Order: NPO  EDUCATION NEEDS: -No education needs identified at this time   Intake/Output Summary (Last 24 hours) at 09/13/13 1154 Last data filed at 09/13/13 1030  Gross per 24 hour  Intake    840 ml  Output   1100 ml  Net   -260 ml    Last BM: 2/15  Labs:   Recent Labs Lab 09/11/13 0430 09/12/13 0555 09/13/13 0521  NA 141 136* 134*  K 3.9 4.3 5.0  CL 100 98 95*  CO2 28 28 27   BUN 25* 22 24*  CREATININE 1.31 1.25 1.30  CALCIUM 8.5 8.6 8.9  GLUCOSE 90 110* 99    CBG (last 3)  No results found for this basename: GLUCAP,  in the last 72 hours  Scheduled Meds: . [facility] HOLD] aspirin EC  81 mg Oral Daily  . [facility] HOLD] atorvastatin  40 mg Oral Daily  . [facility] HOLD] carvedilol  6.25 mg Oral BID WC  . [MAR HOLD] ceFEPime (MAXIPIME) IV  1 g Intravenous Q12H  . [  MAR HOLD] citalopram  40 mg Oral Daily  . [facility] HOLD] colchicine  0.6 mg Oral BID  . [facility][MAR HOLD] doxazosin  8 mg Oral QHS  . [MAR HOLD] enoxaparin (LOVENOX) injection  40 mg Subcutaneous Q24H  . [facility][MAR HOLD] furosemide  40 mg Oral Daily  . [facility][MAR HOLD] gabapentin  600 mg Oral TID  . [MAR HOLD] OxyCODONE  20 mg Oral Q12H  . [MAR HOLD] predniSONE  10 mg Oral Q breakfast  . [MAR HOLD] regadenoson  0.4 mg Intravenous Once  . [facility] HOLD] tamsulosin  0.4 mg Oral QPC breakfast  . [MAR HOLD] vitamin C  500 mg Oral Daily    Continuous Infusions:   Past Medical History  Diagnosis Date  . Hyperlipidemia   . Small bowel problem     HAD ALOT OF  SCAR TISSUE FROM PREVIOUS SURGERIES..NG WAS INSERTED ...Marland Kitchen.Marland Kitchen.NO SURGERY NEEDED...Marland Kitchen.Marland Kitchen.IN FOR 8 DAYS  . Disorder of blood     BEEN TREATED BY DERMATOLOGIST X 4 YRS..."BLOOD BLISTERS"  . Arthritis   . Gout   . Anxiety   . Hx MRSA infection     rt shoulder  . Pneumonia     "I've had it 3-4 times"  . Coronary artery disease     IN 2000   STENT PLACED IN 2012 sees Dr. Dietrich Patesothbart, saw last 2013  . HTN (hypertension)     [place]. [place]  . Small bowel obstruction   . Ventral hernia   . Acute renal failure 04/17/2013  . 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 Anemia, iron deficiency 09/09/2013    Past Surgical History  Procedure Laterality Date  . Sternal surg  2000    HAS HAD 5-6 ON HIS STERNUM; "caught MRSA in it"  . Lumbar laminectomy/decompression microdiscectomy  06/13/2011    Procedure: LUMBAR LAMINECTOMY/DECOMPRESSION MICRODISCECTOMY;  Surgeon: Karn CassisErnesto M Botero;  Location: [facility];  Service: Neurosurgery;  Laterality: N/A;  Lumbar three, lumbar four-five Laminectomy  . Eye surgery      bilateral cataract  . Anterior cervical corpectomy  12/17/11  . Incision and drainage of wound  ~ 20ll; 12/03/11    "had infection in my right"  . Shoulder open rotator cuff repair  ~ 2011    right  . Peripherally inserted central catheter insertion  2011 & 11/2011  . Cataract extraction w/ intraocular lens  implant, bilateral  ? 2011  . Coronary artery bypass graft  2000    CABG X5  . Back surgery      lumbar  . Tonsillectomy  1949  . Cholecystectomy  2006 "or after"  . Coronary angioplasty with stent placement  2012  . Anterior cervical corpectomy  12/17/2011    Procedure: ANTERIOR CERVICAL CORPECTOMY;  Surgeon: Karn CassisErnesto M Botero, MD;  Location: [facility];  Service: Neurosurgery;  Laterality: N/A;  Anterior Cervical Decompression Fusion Five to Thoracic Two with plating  . Lumbar laminectomy/decompression microdiscectomy Right 06/17/2013    Procedure: LUMBAR  LAMINECTOMY/DECOMPRESSION MICRODISCECTOMY 1 LEVEL;  Surgeon: Karn CassisErnesto M Botero, MD;  Location: [facility];  Service: Neurosurgery;  Laterality: Right;  Right L3-4 Microdiskectomy  . Lumbar wound debridement N/A 08/02/2013    Procedure: INCISION AND DRAINAGE OF LUMBAR WOUND DEBRIDEMENT;  Surgeon: Karn CassisErnesto M Botero, MD;  Location: [facility];  Service: Neurosurgery;  Laterality: N/A;    Ian Malkineanne Barnett RD, LDN Inpatient Clinical Dietitian Pager: [phone] After Hours Pager: [phone]

## 2013-09-13 NOTE — Preoperative (Addendum)
Beta Blockers   Reason not to administer Beta Blockers:Not Applicable  Received BB @ 1030 today

## 2013-09-13 NOTE — Anesthesia Procedure Notes (Signed)
Procedure Name: Intubation Date/Time: 09/13/2013 1:06 PM Performed by: Coralee RudFLORES, Finnlee Silvernail Pre-anesthesia Checklist: Patient identified, Emergency Drugs available and Suction available Patient Re-evaluated:Patient Re-evaluated prior to inductionOxygen Delivery Method: Circle system utilized Preoxygenation: Pre-oxygenation with 100% oxygen Intubation Type: IV induction Ventilation: Mask ventilation without difficulty and Oral airway inserted - appropriate to patient size Laryngoscope Size: Miller and 3 Grade View: Grade I Tube type: Oral Tube size: 8.0 mm Number of attempts: 1 Airway Equipment and Method: Stylet Placement Confirmation: ETT inserted through vocal cords under direct vision and positive ETCO2 Secured at: 23 cm Tube secured with: Tape Dental Injury: Teeth and Oropharynx as per pre-operative assessment

## 2013-09-13 NOTE — Progress Notes (Signed)
Patient ID: [name], male   DOB: 05/14/1941, 73 y.o.   MRN: [id number] Patient to have left shoulder intervention today. i did speak with the floor RN MANAGER to see what kind of bladder caths are offered by his insurance company and see if we can get the same he is using at the hospital

## 2013-09-13 NOTE — Anesthesia Preprocedure Evaluation (Addendum)
Anesthesia Evaluation    Airway Mallampati: II TM Distance: >3 FB Neck ROM: Full    Dental   Pulmonary shortness of breath, former smoker,  + rhonchi         Cardiovascular hypertension, + CAD and + Cardiac Stents Rhythm:Regular Rate:Normal     Neuro/Psych Anxiety  Neuromuscular disease    GI/Hepatic GERD-  ,  Endo/Other    Renal/GU Renal InsufficiencyRenal disease     Musculoskeletal   Abdominal   Peds  Hematology  (+) anemia ,   Anesthesia Other Findings   Reproductive/Obstetrics                         Anesthesia Physical Anesthesia Plan  ASA: III  Anesthesia Plan: General   Post-op Pain Management:    Induction: Intravenous  Airway Management Planned: Oral ETT  Additional Equipment:   Intra-op Plan:   Post-operative Plan: Extubation in OR  Informed Consent: I have reviewed the patients History and Physical, chart, labs and discussed the procedure including the risks, benefits and alternatives for the proposed anesthesia with the patient or authorized representative who has indicated his/her understanding and acceptance.     Plan Discussed with: CRNA and Surgeon  Anesthesia Plan Comments:         Anesthesia Quick Evaluation

## 2013-09-13 NOTE — Anesthesia Postprocedure Evaluation (Signed)
  Anesthesia Post-op Note  Patient: Ignacia Marvelonald W Totton  Procedure(s) Performed: Procedure(s): ARTHROSCOPIC IRRIGATION AND DEBRIDEMENT, SYNOVECTOMY ,  LEFT SHOULDER  (Left)  Patient Location: PACU  Anesthesia Type:General  Level of Consciousness: awake  Airway and Oxygen Therapy: Patient Spontanous Breathing  Post-op Pain: mild  Post-op Assessment: Post-op Vital signs reviewed, Patient's Cardiovascular Status Stable, Respiratory Function Stable, Patent Airway, No signs of Nausea or vomiting and Pain level controlled  Post-op Vital Signs: Reviewed and stable  Complications: No apparent anesthesia complications

## 2013-09-13 NOTE — Progress Notes (Signed)
Patient ID: [name], male   DOB: 12/28/1940, 73 y.o.   MRN: [id number] Post-op left shoulder. More mobllity of it with less pain. Waiting for cultures

## 2013-09-13 NOTE — Progress Notes (Signed)
[facility] CONSULT NOTE/ PROGRESS NOTE  Jake MarvelRonald W Rolin ZOX:[id number] DOB: 10/09/1940 DOA: 09/05/2013 PCP: [name],[name], MD  Assessment/Plan: #1 acute combined systolic/diastolic CHF exacerbation Clinical improvement. Cardiac enzymes elevated. 2-D echo with EF of 45% with hypokinesis of the anteroseptal and mild mitral regurgitation.. Pro BNP is elevated. I/O = -19.885 L. Current weight is 90 kg. Continue oral Lasix. Elevated troponins felt to be secondary to acute CHF exacerbation per cardiology. Patient with no active chest pain. 2-D echo with a 45% with new hypokinesis of the anterioroseptum. Per cardiology.  #2 dyspnea Secondary to problem #1. Improved.  #3 fever/chills Likely secondary to Pseudomonas and/or Escherichia coli UTI related to bladder outlet obstruction or I D. Patient currently on IV cefepime. Per ID.  #4. Pseudomonas and Escherichia coli UTI On IV cefepime. Per infectious diseases.  #5 elevated troponins May be secondary to problem #1. Patient currently chest pain-free. 2-D echo with EF of 45% with new hypokinesis of the anterioroseptum and mild mitral regurgitation. Continue aspirin, Lipitor, Coreg, Lasix. Follow.  #6 probable soft tissue abscess in the lumbar region Per primary team. Continue current IV antibiotics as per ID recommendations.  #7 iron deficiency anemia Hemoglobin currently stable at 8.8 from 9.7. Anemia panel with iron level of 15 and a ferritin of 11. If patient has not had a colonoscopy done will likely benefit from one as outpatient. Will hold off on IV iron until patient recovers from acute infection. H&H stable. Follow.  #8 diarrhea C. difficile PCR negative. Imodium as needed.  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 MRI of left shoulder showing a large chronic appearing rotator cuff tear, extensive edema and possible hemorrhage anterior within the deltoid muscle, moderate-sized joint effusion, negative for  osteomyelitis. Patient has been seen by orthopedics and patient is to go to the operating room today for possible surgical intervention. Orthopedics was consulted and appreciate input and recommendations. Patient is currently afebrile with a normal white count. Patient on empiric IV cefepime.  Code Status: Full Family Communication: Updated patient no family present. Disposition Plan: Per primary team   Consultants: [facility] : Dr. [name] 09/07/2013  Infectious diseases: Dr. [name] 09/06/2013  Cardiology Dr. [name] 09/08/2013  Ortho : Dr Carloyn Manneraffrey Jr 09/13/13  Procedures:  Chest x-ray 09/07/2013, 09/05/2013  MRI of the L-spine 09/05/2013  2-D echo 09/08/13  XRAY L shoulder 09/12/13  MRI L shoulder 09/13/13  Antibiotics:  IV cefepime 09/06/2013  IV Rocephin t2/9/15-->09/05/13  Oral Bactrim 09/05/2013 --> 09/06/2013  IV vancomycin 09/05/2013----> 09/05/2013  HPI/Subjective: Patient states his breathing has improved. Patient complaining of left shoulder pain.  Objective: Filed Vitals:   09/13/13 1019  BP: 141/45  Pulse: 58  Temp: 98.1 F (36.7 C)  Resp: 18    Intake/Output Summary (Last 24 hours) at 09/13/13 1115 Last data filed at 09/13/13 1030  Gross per 24 hour  Intake    840 ml  Output   1100 ml  Net   -260 ml   Filed Weights   09/05/13 1946  Weight: 90 kg (198 lb 6.6 oz)    Exam:   General:  NAD  Cardiovascular: Regular rate rhythm no murmurs rubs or gallops.  Respiratory: CTAB anterior lung fields.  Abdomen: Soft, nontender, nondistended, positive bowel sounds  Musculoskeletal: No clubbing or cyanosis, no edema. Left shoulder tender to palpation with some swelling.    Data Reviewed: Basic Metabolic Panel:  Recent Labs Lab 09/09/13 0515 09/10/13 [id number] 09/11/13 0430 09/12/13 [id number]  09/13/13 0521  NA 139 142 141 136* 134*  K 4.1 3.6* 3.9 4.3 5.0  CL 101 100 100 98 95*  CO2 29 31 28 28 27   GLUCOSE 85 88 90 110* 99  BUN 28*  24* 25* 22 24*  CREATININE 1.42* 1.26 1.31 1.25 1.30  CALCIUM 8.4 8.8 8.5 8.6 8.9   Liver Function Tests: No results found for this basename: AST, ALT, ALKPHOS, BILITOT, PROT, ALBUMIN,  in the last 168 hours No results found for this basename: LIPASE, AMYLASE,  in the last 168 hours No results found for this basename: AMMONIA,  in the last 168 hours CBC:  Recent Labs Lab 09/07/13 1547 09/08/13 0837 09/09/13 0850 09/10/13 0830 09/12/13 0555 09/13/13 0521  WBC 9.0 7.1 4.1 4.4 9.1 7.8  NEUTROABS 8.1*  --   --   --   --   --   HGB 8.4* 9.1* 9.1* 9.7* 9.4* 8.8*  HCT 24.9* 27.7* 27.4* 29.1* 28.4* 25.9*  MCV 87.7 87.9 86.7 87.1 87.1 86.9  PLT 129* 151 164 212 239 235   Cardiac Enzymes:  Recent Labs Lab 09/07/13 1547 09/07/13 1959 09/08/13 0448  TROPONINI 0.56* 0.55* 0.51*   BNP (last 3 results)  Recent Labs  09/08/13 0918 09/10/13 0435 09/11/13 0430  PROBNP 33476.0* 11292.0* 3379.0*   CBG: No results found for this basename: GLUCAP,  in the last 168 hours  Recent Results (from the past 240 hour(s))  URINE CULTURE     Status: None   Collection Time    09/05/13  1:35 PM      Result Value Ref Range Status   Specimen Description URINE, CATHETERIZED   Final   Special Requests NONE   Final   Culture  Setup Time     Final   Value: 09/05/2013 21:02     Performed at [facility] Count     Final   Value: >=100,000 COLONIES/ML     Performed at [facility]   Culture     Final   Value: ESCHERICHIA COLI     Performed at [facility]   Report Status 09/07/2013 FINAL   Final   Organism ID, Bacteria ESCHERICHIA COLI   Final  CULTURE, BLOOD (ROUTINE X 2)     Status: None   Collection Time    09/05/13 11:15 PM      Result Value Ref Range Status   Specimen Description BLOOD RIGHT ANTECUBITAL   Final   Special Requests BOTTLES DRAWN AEROBIC ONLY 10CC   Final   Culture  Setup Time     Final   Value: 09/06/2013 03:28     Performed at [facility]   Culture     Final   Value: NO GROWTH 5 DAYS     Performed at [facility]   Report Status 09/12/2013 FINAL   Final  CULTURE, BLOOD (ROUTINE X 2)     Status: None   Collection Time    09/05/13 11:20 PM      Result Value Ref Range Status   Specimen Description BLOOD RIGHT HAND   Final   Special Requests BOTTLES DRAWN AEROBIC ONLY 10CC   Final   Culture  Setup Time     Final   Value: 09/06/2013 03:28     Performed at [facility]   Culture     Final   Value: NO GROWTH 5 DAYS     Performed at [facility]  Report Status 09/12/2013 FINAL   Final  MRSA PCR SCREENING     Status: None   Collection Time    09/05/13 11:50 PM      Result Value Ref Range Status   MRSA by PCR NEGATIVE  NEGATIVE Final   Comment:            The GeneXpert MRSA Assay (FDA     approved for NASAL specimens     only), is one component of a     comprehensive MRSA colonization     surveillance program. It is not     intended to diagnose MRSA     infection nor to guide or     monitor treatment for     MRSA infections.  CLOSTRIDIUM DIFFICILE BY PCR     Status: None   Collection Time    09/10/13 11:21 PM      Result Value Ref Range Status   C difficile by pcr NEGATIVE  NEGATIVE Final     Studies: Mr Shoulder Left Wo Contrast  09/13/2013   CLINICAL DATA:  Severe left shoulder pain. History of rotator cuff tear. Evaluate for possible infection.  EXAM: MRI OF THE LEFT SHOULDER WITHOUT CONTRAST  TECHNIQUE: Multiplanar, multisequence MR imaging of the shoulder was performed. No intravenous contrast was administered.  COMPARISON:  Radiographs 09/12/2013.  FINDINGS: Rotator cuff: There is a chronic appearing full-thickness rotator cuff tear associated with a high riding humeral head resulting in obliteration of the subacromial space appear There is associated retraction of the supraspinatus and infraspinous tendons. The subscapularis tendon also appears torn and partially retracted.   Muscles: There is diffuse atrophy of the rotator cuff musculature. There is some edema within the supraspinatus muscle. There is also extensive heterogeneous signal within the overlying deltoid muscle, especially anteriorly where there are foci of the low T1 and T2 signal. There is no corresponding calcification on the radiographs, and this may reflect hemorrhage. There is adjacent subcutaneous edema without focal extra-articular fluid collection.  Biceps long head: The intra-articular portion is poorly visualized and likely ruptured.  Acromioclavicular Joint: The acromion is type 2. There are moderate acromioclavicular degenerative changes. As above, the subacromial space is obliterated by the high riding humeral head. There is complex fluid within the subacromial -subdeltoid and the subcoracoid bursa.  Glenohumeral Joint: Moderate size complex joint effusion, likely in part due to synovitis. There are diffuse glenohumeral degenerative changes with osteophytes and chondral thinning.  Labrum: Diffuse degenerative tearing of the labrum, especially superiorly and posteriorly.  Bones: There is degenerative subchondral edema and cyst formation posteriorly in the glenoid. No bone destruction is demonstrated to suggest osteomyelitis. There is no evidence of acute fracture.  IMPRESSION: 1. Large chronic appearing rotator cuff tear as described with associated tendon retraction and muscular atrophy. 2. Extensive edema and possible hemorrhage anteriorly within the deltoid muscle. There is adjacent subcutaneous edema as well as associated bursal fluid and a moderate-sized joint effusion. Soft tissue infection cannot be excluded. 3. Glenohumeral degenerative changes with diffuse labral tearing and probable rupture of the intra-articular portion of the biceps tendon. 4. No evidence of osteomyelitis.   Electronically Signed   By: Roxy Horseman M.D.   On: 09/13/2013 08:29   Dg Shoulder Left  09/12/2013   CLINICAL DATA:  Left  shoulder pain for 2 days, no acute injury  EXAM: LEFT SHOULDER - 2+ VIEW  COMPARISON:  Left shoulder films of 11/06/2010  FINDINGS: There is considerable spurring from the acromion resulting  in impingement possible chronic rotator cuff disease. Degenerative spurring also is noted involving the left [facility] joint which may impinge as well. No acute bony abnormality is seen. The left shoulder joint space is only minimally narrowed.  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   Electronically Signed   By: Dwyane Dee M.D.   On: 09/12/2013 15:18    Scheduled Meds: . aspirin EC  81 mg Oral Daily  . atorvastatin  40 mg Oral Daily  . carvedilol  6.25 mg Oral BID WC  . ceFEPime (MAXIPIME) IV  1 g Intravenous Q12H  . citalopram  40 mg Oral Daily  . colchicine  0.6 mg Oral BID  . doxazosin  8 mg Oral QHS  . [START ON 09/14/2013] enoxaparin (LOVENOX) injection  40 mg Subcutaneous Q24H  . furosemide  40 mg Oral Daily  . gabapentin  600 mg Oral TID  . OxyCODONE  20 mg Oral Q12H  . predniSONE  10 mg Oral Q breakfast  . regadenoson  0.4 mg Intravenous Once  . tamsulosin  0.4 mg Oral QPC breakfast  . vitamin C  500 mg Oral Daily   Continuous Infusions:   Principal Problem:   Cauda equina syndrome with neurogenic bladder Active Problems:   Acute CHF (congestive heart failure)   HYPERTENSION   CAD in native artery   Lumbar degenerative disc disease   Altered mental status   Fever and chills   Dyspnea   UTI (urinary tract infection)   Elevated troponin   Anemia, iron deficiency   Shoulder pain, left    Time spent: 40 mins    [facility] MD [facility] Pager [phone]. If 7PM-7AM, please contact night-coverage at [email or web address], password [facility] 09/13/2013, 11:15 AM  LOS: 8 days

## 2013-09-13 NOTE — Consult Note (Signed)
  I have reviewed case and will proceed with surgery

## 2013-09-14 DIAGNOSIS — X58XXXA Exposure to other specified factors, initial encounter: Secondary | ICD-10-CM

## 2013-09-14 DIAGNOSIS — S43429A Sprain of unspecified rotator cuff capsule, initial encounter: Principal | ICD-10-CM

## 2013-09-14 LAB — CBC WITH DIFFERENTIAL/PLATELET
BASOS ABS: 0 10*3/uL (ref 0.0–0.1)
Basophils Relative: 0 % (ref 0–1)
Eosinophils Absolute: 0.2 10*3/uL (ref 0.0–0.7)
Eosinophils Relative: 2 % (ref 0–5)
HCT: 27.2 % — ABNORMAL LOW (ref 39.0–52.0)
Hemoglobin: 9.2 g/dL — ABNORMAL LOW (ref 13.0–17.0)
Lymphocytes Relative: 17 % (ref 12–46)
Lymphs Abs: 1.1 10*3/uL (ref 0.7–4.0)
MCH: 29.5 pg (ref 26.0–34.0)
MCHC: 33.8 g/dL (ref 30.0–36.0)
MCV: 87.2 fL (ref 78.0–100.0)
MONO ABS: 1 10*3/uL (ref 0.1–1.0)
Monocytes Relative: 14 % — ABNORMAL HIGH (ref 3–12)
NEUTROS PCT: 67 % (ref 43–77)
Neutro Abs: 4.5 10*3/uL (ref 1.7–7.7)
PLATELETS: 255 10*3/uL (ref 150–400)
RBC: 3.12 MIL/uL — ABNORMAL LOW (ref 4.22–5.81)
RDW: 15.5 % (ref 11.5–15.5)
WBC: 6.7 10*3/uL (ref 4.0–10.5)

## 2013-09-14 LAB — BASIC METABOLIC PANEL WITH GFR
BUN: 23 mg/dL (ref 6–23)
CO2: 28 meq/L (ref 19–32)
Calcium: 9 mg/dL (ref 8.4–10.5)
Chloride: 98 meq/L (ref 96–112)
Creatinine, Ser: 1.39 mg/dL — ABNORMAL HIGH (ref 0.50–1.35)
GFR calc Af Amer: 58 mL/min — ABNORMAL LOW
GFR calc non Af Amer: 50 mL/min — ABNORMAL LOW
Glucose, Bld: 96 mg/dL (ref 70–99)
Potassium: 4.3 meq/L (ref 3.7–5.3)
Sodium: 138 meq/L (ref 137–147)

## 2013-09-14 MED ORDER — FUROSEMIDE 20 MG PO TABS
20.0000 mg | ORAL_TABLET | Freq: Every day | ORAL | Status: DC
  Administered 2013-09-14 – 2013-09-20 (×7): 20 mg via ORAL
  Filled 2013-09-14 (×7): qty 1

## 2013-09-14 NOTE — Progress Notes (Addendum)
Patient ID: [name], male   DOB: 04/19/1941, 73 y.o.   MRN: [id number]         [facility]    Date of Admission:  09/05/2013    Total days of antibiotics 47        Day 9 cefepime        Principal Problem:   Cauda equina syndrome with neurogenic bladder Active Problems:   Fever and chills   Lumbar degenerative disc disease   HYPERTENSION   CAD in native artery   Altered mental status   Dyspnea   UTI (urinary tract infection)   Acute CHF (congestive heart failure)   Elevated troponin   Anemia, iron deficiency   Shoulder pain, left   . aspirin EC  81 mg Oral Daily  . atorvastatin  40 mg Oral Daily  . carvedilol  6.25 mg Oral BID WC  . ceFEPime (MAXIPIME) IV  1 g Intravenous Q12H  . citalopram  40 mg Oral Daily  . colchicine  0.6 mg Oral BID  . doxazosin  8 mg Oral QHS  . enoxaparin (LOVENOX) injection  40 mg Subcutaneous Q24H  . furosemide  20 mg Oral Daily  . gabapentin  600 mg Oral TID  . OxyCODONE  20 mg Oral Q12H  . predniSONE  10 mg Oral Q breakfast  . regadenoson  0.4 mg Intravenous Once  . tamsulosin  0.4 mg Oral QPC breakfast  . vitamin C  500 mg Oral Daily   Objective: Temp:  [97.9 F (36.6 C)-98.5 F (36.9 C)] 98.1 F (36.7 C) (02/18 1300) Pulse Rate:  [53-65] 54 (02/18 1300) Resp:  [18-20] 18 (02/18 1300) BP: (95-157)/(44-65) 118/62 mmHg (02/18 1300) SpO2:  [92 %-98 %] 92 % (02/18 1300)  Lab Results Lab Results  Component Value Date   WBC 6.7 09/14/2013   HGB 9.2* 09/14/2013   HCT 27.2* 09/14/2013   MCV 87.2 09/14/2013   PLT 255 09/14/2013    Lab Results  Component Value Date   CREATININE 1.39* 09/14/2013   BUN 23 09/14/2013   NA 138 09/14/2013   K 4.3 09/14/2013   CL 98 09/14/2013   CO2 28 09/14/2013    Lab Results  Component Value Date   ALT 11 09/05/2013   AST 17 09/05/2013   ALKPHOS 57 09/05/2013   BILITOT 0.3 09/05/2013      Microbiology: Recent Results (from the past 240 hour(s))  URINE CULTURE     Status:  None   Collection Time    09/05/13  1:35 PM      Result Value Ref Range Status   Specimen Description URINE, CATHETERIZED   Final   Special Requests NONE   Final   Culture  Setup Time     Final   Value: 09/05/2013 21:02     [facility]   Colony Count     Final   Value: >=100,000 COLONIES/ML     [facility]   Culture     Final   Value: ESCHERICHIA COLI     [facility]   Report Status 09/07/2013 FINAL   Final   Organism ID, Bacteria ESCHERICHIA COLI   Final  CULTURE, BLOOD (ROUTINE X 2)     Status: None   Collection Time    09/05/13 11:15 PM      Result Value Ref Range Status   Specimen Description BLOOD RIGHT ANTECUBITAL   Final  Special Requests BOTTLES DRAWN AEROBIC ONLY 10CC   Final   Culture  Setup Time     Final   Value: 09/06/2013 03:28     Performed at [facility]   Culture     Final   Value: NO GROWTH 5 DAYS     Performed at [facility]   Report Status 09/12/2013 FINAL   Final  CULTURE, BLOOD (ROUTINE X 2)     Status: None   Collection Time    09/05/13 11:20 PM      Result Value Ref Range Status   Specimen Description BLOOD RIGHT HAND   Final   Special Requests BOTTLES DRAWN AEROBIC ONLY 10CC   Final   Culture  Setup Time     Final   Value: 09/06/2013 03:28     Performed at [facility]   Culture     Final   Value: NO GROWTH 5 DAYS     Performed at [facility]   Report Status 09/12/2013 FINAL   Final  MRSA PCR SCREENING     Status: None   Collection Time    09/05/13 11:50 PM      Result Value Ref Range Status   MRSA by PCR NEGATIVE  NEGATIVE Final   Comment:            The GeneXpert MRSA Assay (FDA     approved for NASAL specimens     only), is one component of a     comprehensive MRSA colonization     surveillance program. It is not     intended to diagnose MRSA     infection nor to guide or     monitor treatment for     MRSA infections.  CLOSTRIDIUM  DIFFICILE BY PCR     Status: None   Collection Time    09/10/13 11:21 PM      Result Value Ref Range Status   C difficile by pcr NEGATIVE  NEGATIVE Final  BODY FLUID CULTURE     Status: None   Collection Time    09/13/13  1:34 PM      Result Value Ref Range Status   Specimen Description SYNOVIAL FLUID SHOULDER LEFT   Final   Special Requests PT ON MAXIPIME   Final   Gram Stain     Final   Value: NO WBC SEEN     NO ORGANISMS SEEN     Performed at [facility]   Culture     Final   Value: NO GROWTH 1 DAY     Performed at [facility]   Report Status PENDING   Incomplete  ANAEROBIC CULTURE     Status: None   Collection Time    09/13/13  1:34 PM      Result Value Ref Range Status   Specimen Description SYNOVIAL FLUID SHOULDER LEFT   Final   Special Requests PT ON MAXIPIME   Final   Gram Stain     Final   Value: NO WBC SEEN     NO ORGANISMS SEEN     Performed at [facility]   Culture     Final   Value: No Anaerobes Isolated; Culture in Progress for 14 Days     Performed at [facility]   Report Status PENDING   Incomplete    Studies/Results: Mr Shoulder Left Wo Contrast  09/13/2013   CLINICAL DATA:  Severe left shoulder pain. History of  rotator cuff tear. Evaluate for possible infection.  EXAM: MRI OF THE LEFT SHOULDER WITHOUT CONTRAST  TECHNIQUE: Multiplanar, multisequence MR imaging of the shoulder was performed. No intravenous contrast was administered.  COMPARISON:  Radiographs 09/12/2013.  FINDINGS: Rotator cuff: There is a chronic appearing full-thickness rotator cuff tear associated with a high riding humeral head resulting in obliteration of the subacromial space appear There is associated retraction of the supraspinatus and infraspinous tendons. The subscapularis tendon also appears torn and partially retracted.  Muscles: There is diffuse atrophy of the rotator cuff musculature. There is some edema within the supraspinatus muscle. There is  also extensive heterogeneous signal within the overlying deltoid muscle, especially anteriorly where there are foci of the low T1 and T2 signal. There is no corresponding calcification on the radiographs, and this may reflect hemorrhage. There is adjacent subcutaneous edema without focal extra-articular fluid collection.  Biceps long head: The intra-articular portion is poorly visualized and likely ruptured.  Acromioclavicular Joint: The acromion is type 2. There are moderate acromioclavicular degenerative changes. As above, the subacromial space is obliterated by the high riding humeral head. There is complex fluid within the subacromial -subdeltoid and the subcoracoid bursa.  Glenohumeral Joint: Moderate size complex joint effusion, likely in part due to synovitis. There are diffuse glenohumeral degenerative changes with osteophytes and chondral thinning.  Labrum: Diffuse degenerative tearing of the labrum, especially superiorly and posteriorly.  Bones: There is degenerative subchondral edema and cyst formation posteriorly in the glenoid. No bone destruction is demonstrated to suggest osteomyelitis. There is no evidence of acute fracture.  IMPRESSION: 1. Large chronic appearing rotator cuff tear as described with associated tendon retraction and muscular atrophy. 2. Extensive edema and possible hemorrhage anteriorly within the deltoid muscle. There is adjacent subcutaneous edema as well as associated bursal fluid and a moderate-sized joint effusion. Soft tissue infection cannot be excluded. 3. Glenohumeral degenerative changes with diffuse labral tearing and probable rupture of the intra-articular portion of the biceps tendon. 4. No evidence of osteomyelitis.   Electronically Signed   By: Roxy Horseman M.D.   On: 09/13/2013 08:29    Assessment: I was notified yesterday by Dr. Janee Morn of the surgery on his left shoulder. I reviewed the operative note and it indicated that a "massive rotator cuff tear with  hemorrhagic effusion" was encountered. There does not appear to be any evidence of septic arthritis. Gram stain revealed no white blood cells and no organisms. Cultures are negative at 24 hours. I would plan on continuing cefepime for his urinary tract infection for 5 more days.  Plan: 1. Continue cefepime  Cliffton Asters, MD [facility] [phone] pager   [phone] cell 09/14/2013, 3:08 PM

## 2013-09-14 NOTE — Progress Notes (Signed)
CONSULT NOTE/ PROGRESS NOTE  [name] AOZ:[id number] DOB: [birth year] DOA: 09/05/2013 PCP: Fredirick Maudlin, MD  Assessment/Plan:  acute combined systolic/diastolic CHF exacerbation Clinical improvement- cardiology following Cardiac enzymes elevated 2-D echo with EF of 45% with hypokinesis of the anteroseptal and mild mitral regurgitation Pro BNP is elevated. I/O = -19.885 L.  Lasix- will need to watch Cr as increasing- cut lasix dose in half Elevated troponins felt to be secondary to acute CHF exacerbation per cardiology.    Pseudomonas and Escherichia coli UTI On IV cefepime. Per infectious diseases.   probable soft tissue abscess in the lumbar region Per primary team. Continue current IV antibiotics as per ID recommendations.   iron deficiency anemia Hemoglobin currently stable at 8.8 from 9.7. Anemia panel with iron level of 15 and a ferritin of 11. If patient has not had a colonoscopy done will likely benefit from one as outpatient. Will hold off on IV iron until patient recovers from acute infection. H&H stable. Follow.   diarrhea C. difficile PCR negative. Imodium as needed.   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 MRI of left shoulder showing a large chronic appearing rotator cuff tear, extensive edema and possible hemorrhage anterior within the deltoid muscle, moderate-sized joint effusion, negative for osteomyelitis.  orthopedics  S/p I/D on 2/17   Code Status: Full Family Communication: Updated patient no family present. Disposition Plan: Per primary team   Consultants: [facility]   Infectious diseases: Dr. Orvan Falconer 09/06/2013  Cardiology Dr. Eldridge Dace 09/08/2013  Ortho : Dr Carloyn Manner 09/13/13  Procedures:  Chest x-ray 09/07/2013, 09/05/2013  MRI of the L-spine 09/05/2013  2-D echo 09/08/13  XRAY L shoulder 09/12/13  MRI L shoulder 09/13/13  Antibiotics:  IV cefepime 09/06/2013  IV Rocephin  t2/9/15-->09/05/13  Oral Bactrim 09/05/2013 --> 09/06/2013  IV vancomycin 09/05/2013----> 09/05/2013  HPI/Subjective: No new c/o No SOB, no CP  Objective: Filed Vitals:   09/14/13 0539  BP: 156/51  Pulse: 64  Temp: 97.9 F (36.6 C)  Resp: 20    Intake/Output Summary (Last 24 hours) at 09/14/13 0835 Last data filed at 09/13/13 2100  Gross per 24 hour  Intake   1750 ml  Output   2150 ml  Net   -400 ml   Filed Weights   09/05/13 1946  Weight: 90 kg (198 lb 6.6 oz)    Exam:   General:  NAD  Cardiovascular: Regular rate rhythm no murmurs rubs or gallops.  Respiratory: CTAB anterior lung fields.  Abdomen: Soft, nontender, nondistended, positive bowel sounds  Musculoskeletal: No clubbing or cyanosis, no edema. Left shoulder tender to palpation with some swelling.    Data Reviewed: Basic Metabolic Panel:  Recent Labs Lab 09/10/13 0620 09/11/13 0430 09/12/13 0555 09/13/13 0521 09/14/13 0400  NA 142 141 136* 134* 138  K 3.6* 3.9 4.3 5.0 4.3  CL 100 100 98 95* 98  CO2 31 28 28 27 28   GLUCOSE 88 90 110* 99 96  BUN 24* 25* 22 24* 23  CREATININE 1.26 1.31 1.25 1.30 1.39*  CALCIUM 8.8 8.5 8.6 8.9 9.0   Liver Function Tests: No results found for this basename: AST, ALT, ALKPHOS, BILITOT, PROT, ALBUMIN,  in the last 168 hours No results found for this basename: LIPASE, AMYLASE,  in the last 168 hours No results found for this basename: AMMONIA,  in the last 168 hours CBC:  Recent Labs Lab 09/07/13 1547  09/09/13 0850 09/10/13 0830 09/12/13 0555 09/13/13 9629  09/14/13 0400  WBC 9.0  < > 4.1 4.4 9.1 7.8 6.7  NEUTROABS 8.1*  --   --   --   --   --  4.5  HGB 8.4*  < > 9.1* 9.7* 9.4* 8.8* 9.2*  HCT 24.9*  < > 27.4* 29.1* 28.4* 25.9* 27.2*  MCV 87.7  < > 86.7 87.1 87.1 86.9 87.2  PLT 129*  < > 164 212 239 235 255  < > = values in this interval not displayed. Cardiac Enzymes:  Recent Labs Lab 09/07/13 1547 09/07/13 1959 09/08/13 0448  TROPONINI 0.56*  0.55* 0.51*   BNP (last 3 results)  Recent Labs  09/08/13 0918 09/10/13 0435 09/11/13 0430  PROBNP 33476.0* 11292.0* 3379.0*   CBG: No results found for this basename: GLUCAP,  in the last 168 hours  Recent Results (from the past 240 hour(s))  URINE CULTURE     Status: None   Collection Time    09/05/13  1:35 PM      Result Value Ref Range Status   Specimen Description URINE, CATHETERIZED   Final   Special Requests NONE   Final   Culture  Setup Time     Final   Value: 09/05/2013 21:02     Performed at [facility] Count     Final   Value: >=100,000 COLONIES/ML     Performed at [facility]   Culture     Final   Value: ESCHERICHIA COLI     Performed at [facility]   Report Status 09/07/2013 FINAL   Final   Organism ID, Bacteria ESCHERICHIA COLI   Final  CULTURE, BLOOD (ROUTINE X 2)     Status: None   Collection Time    09/05/13 11:15 PM      Result Value Ref Range Status   Specimen Description BLOOD RIGHT ANTECUBITAL   Final   Special Requests BOTTLES DRAWN AEROBIC ONLY 10CC   Final   Culture  Setup Time     Final   Value: 09/06/2013 03:28     Performed at [facility]   Culture     Final   Value: NO GROWTH 5 DAYS     Performed at [facility]   Report Status 09/12/2013 FINAL   Final  CULTURE, BLOOD (ROUTINE X 2)     Status: None   Collection Time    09/05/13 11:20 PM      Result Value Ref Range Status   Specimen Description BLOOD RIGHT HAND   Final   Special Requests BOTTLES DRAWN AEROBIC ONLY 10CC   Final   Culture  Setup Time     Final   Value: 09/06/2013 03:28     Performed at [facility]   Culture     Final   Value: NO GROWTH 5 DAYS     Performed at [facility]   Report Status 09/12/2013 FINAL   Final  MRSA PCR SCREENING     Status: None   Collection Time    09/05/13 11:50 PM      Result Value Ref Range Status   MRSA by PCR NEGATIVE  NEGATIVE Final   Comment:            The  GeneXpert MRSA Assay (FDA     approved for NASAL specimens     only), is one component of a     comprehensive MRSA colonization     surveillance program. It  is not     intended to diagnose MRSA     infection nor to guide or     monitor treatment for     MRSA infections.  CLOSTRIDIUM DIFFICILE BY PCR     Status: None   Collection Time    09/10/13 11:21 PM      Result Value Ref Range Status   C difficile by pcr NEGATIVE  NEGATIVE Final  BODY FLUID CULTURE     Status: None   Collection Time    09/13/13  1:34 PM      Result Value Ref Range Status   Specimen Description SYNOVIAL FLUID SHOULDER LEFT   Final   Special Requests PT ON MAXIPIME   Final   Gram Stain     Final   Value: NO WBC SEEN     NO ORGANISMS SEEN     Performed at [facility]   Culture     Final   Value: NO GROWTH     Performed at [facility]   Report Status PENDING   Incomplete  ANAEROBIC CULTURE     Status: None   Collection Time    09/13/13  1:34 PM      Result Value Ref Range Status   Specimen Description SYNOVIAL FLUID SHOULDER LEFT   Final   Special Requests PT ON MAXIPIME   Final   Gram Stain     Final   Value: NO WBC SEEN     NO ORGANISMS SEEN     Performed at [facility]   Culture PENDING   Incomplete   Report Status PENDING   Incomplete     Studies: Mr Shoulder Left Wo Contrast  09/13/2013   CLINICAL DATA:  Severe left shoulder pain. History of rotator cuff tear. Evaluate for possible infection.  EXAM: MRI OF THE LEFT SHOULDER WITHOUT CONTRAST  TECHNIQUE: Multiplanar, multisequence MR imaging of the shoulder was performed. No intravenous contrast was administered.  COMPARISON:  Radiographs 09/12/2013.  FINDINGS: Rotator cuff: There is a chronic appearing full-thickness rotator cuff tear associated with a high riding humeral head resulting in obliteration of the subacromial space appear There is associated retraction of the supraspinatus and infraspinous tendons. The  subscapularis tendon also appears torn and partially retracted.  Muscles: There is diffuse atrophy of the rotator cuff musculature. There is some edema within the supraspinatus muscle. There is also extensive heterogeneous signal within the overlying deltoid muscle, especially anteriorly where there are foci of the low T1 and T2 signal. There is no corresponding calcification on the radiographs, and this may reflect hemorrhage. There is adjacent subcutaneous edema without focal extra-articular fluid collection.  Biceps long head: The intra-articular portion is poorly visualized and likely ruptured.  Acromioclavicular Joint: The acromion is type 2. There are moderate acromioclavicular degenerative changes. As above, the subacromial space is obliterated by the high riding humeral head. There is complex fluid within the subacromial -subdeltoid and the subcoracoid bursa.  Glenohumeral Joint: Moderate size complex joint effusion, likely in part due to synovitis. There are diffuse glenohumeral degenerative changes with osteophytes and chondral thinning.  Labrum: Diffuse degenerative tearing of the labrum, especially superiorly and posteriorly.  Bones: There is degenerative subchondral edema and cyst formation posteriorly in the glenoid. No bone destruction is demonstrated to suggest osteomyelitis. There is no evidence of acute fracture.  IMPRESSION: 1. Large chronic appearing rotator cuff tear as described with associated tendon retraction and muscular atrophy. 2. Extensive edema and possible hemorrhage anteriorly  within the deltoid muscle. There is adjacent subcutaneous edema as well as associated bursal fluid and a moderate-sized joint effusion. Soft tissue infection cannot be excluded. 3. Glenohumeral degenerative changes with diffuse labral tearing and probable rupture of the intra-articular portion of the biceps tendon. 4. No evidence of osteomyelitis.   Electronically Signed   By: Roxy HorsemanBill  Veazey M.D.   On: 09/13/2013  08:29   Dg Shoulder Left  09/12/2013   CLINICAL DATA:  Left shoulder pain for 2 days, no acute injury  EXAM: LEFT SHOULDER - 2+ VIEW  COMPARISON:  Left shoulder films of 11/06/2010  FINDINGS: There is considerable spurring from the acromion resulting in impingement possible chronic rotator cuff disease. Degenerative spurring also is noted involving the left [facility] which may impinge as well. No acute bony abnormality is seen. The left shoulder joint space is only minimally narrowed.  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   Electronically Signed   By: Dwyane DeePaul  Barry M.D.   On: 09/12/2013 15:18    Scheduled Meds: . aspirin EC  81 mg Oral Daily  . atorvastatin  40 mg Oral Daily  . carvedilol  6.25 mg Oral BID WC  . ceFEPime (MAXIPIME) IV  1 g Intravenous Q12H  . citalopram  40 mg Oral Daily  . colchicine  0.6 mg Oral BID  . doxazosin  8 mg Oral QHS  . enoxaparin (LOVENOX) injection  40 mg Subcutaneous Q24H  . furosemide  40 mg Oral Daily  . gabapentin  600 mg Oral TID  . OxyCODONE  20 mg Oral Q12H  . predniSONE  10 mg Oral Q breakfast  . regadenoson  0.4 mg Intravenous Once  . tamsulosin  0.4 mg Oral QPC breakfast  . vitamin C  500 mg Oral Daily   Continuous Infusions:   Principal Problem:   Cauda equina syndrome with neurogenic bladder Active Problems:   HYPERTENSION   CAD in native artery   Lumbar degenerative disc disease   Altered mental status   Fever and chills   Dyspnea   UTI (urinary tract infection)   Acute CHF (congestive heart failure)   Elevated troponin   Anemia, iron deficiency   Shoulder pain, left    Time spent: 40 mins    Zayda Angell DO [facility] Pager [phone] If 7PM-7AM, please contact night-coverage at [email or web address], password [email or web address] 09/14/2013, 8:35 AM  LOS: 9 days

## 2013-09-14 NOTE — Progress Notes (Signed)
Subjective: 1 Day Post-Op Procedure(s) (LRB): ARTHROSCOPIC IRRIGATION AND DEBRIDEMENT, SYNOVECTOMY ,  LEFT SHOULDER  (Left) Patient reports pain as mild and moderate.   No growth on specimen from OR Patient does report improvement in shoulder pain although still sore.   Objective: Vital signs in last 24 hours: Temp:  [97.6 F (36.4 C)-98.5 F (36.9 C)] 98.2 F (36.8 C) (02/18 1031) Pulse Rate:  [50-65] 53 (02/18 1031) Resp:  [10-25] 18 (02/18 1031) BP: (95-157)/(44-93) 128/44 mmHg (02/18 1031) SpO2:  [93 %-100 %] 97 % (02/18 1031)  Intake/Output from previous day: 02/17 0701 - 02/18 0700 In: 1750 [P.O.:600; I.V.:1150] Out: 2150 [Urine:2150] Intake/Output this shift: Total I/O In: 360 [P.O.:360] Out: -    Recent Labs  09/12/13 0555 09/13/13 0521 09/14/13 0400  HGB 9.4* 8.8* 9.2*    Recent Labs  09/13/13 0521 09/14/13 0400  WBC 7.8 6.7  RBC 2.98* 3.12*  HCT 25.9* 27.2*  PLT 235 255    Recent Labs  09/13/13 0521 09/14/13 0400  NA 134* 138  K 5.0 4.3  CL 95* 98  CO2 27 28  BUN 24* 23  CREATININE 1.30 1.39*  GLUCOSE 99 96  CALCIUM 8.9 9.0   No results found for this basename: LABPT, INR,  in the last 72 hours  Sensation intact distally Intact pulses distally Incision: dressing C/D/I  Assessment/Plan: 1 Day Post-Op Procedure(s) (LRB): ARTHROSCOPIC IRRIGATION AND DEBRIDEMENT, SYNOVECTOMY ,  LEFT SHOULDER  (Left) Patient has extensive cuff tear and some degenerative joint disease do not anticipate him to regain full motion but will consult OT for assist with ADL's.  Will continue to follow while in house. Sutures may be removed in 5-7 days post op, if still inpatient will remove otherwise we would be happy to see him in clinic for removal or may be removed at one of his other scheduled doctor visits to reduce number of follow up appointments for the patient. Future treatment considerations for pain control could consider repeat corticosteroid injection  in 2-3 months.   Jake SicklesChadwell, [name] 09/14/2013, 12:17 PM

## 2013-09-14 NOTE — Progress Notes (Signed)
Subjective: No chest pain, only SOB while in MRI-he though it may have been anxiety.  Objective: Vital signs in last 24 hours: Temp:  [97.6 F (36.4 C)-98.5 F (36.9 C)] 97.9 F (36.6 C) (02/18 0539) Pulse Rate:  [50-65] 64 (02/18 0539) Resp:  [10-25] 20 (02/18 0539) BP: (95-157)/(44-93) 156/51 mmHg (02/18 0539) SpO2:  [93 %-100 %] 96 % (02/18 0539) Weight change:  Last BM Date: 09/11/13 Intake/Output from previous day: -400 02/17 0701 - 02/18 0700 In: 1750 [P.O.:600; I.V.:1150] Out: 2150 [Urine:2150] Intake/Output this shift: Total I/O In: 360 [P.O.:360] Out: -   PE: General:Pleasant affect, NAD Skin:Warm and dry, brisk capillary refill HEENT:normocephalic, sclera clear, mucus membranes moist Heart:S1S2 RRR without murmur, gallup, rub or click Lungs:clear ant.without rales, rhonchi, or wheezes ZOX:WRUE, non tender, + BS, do not palpate liver spleen or masses Ext:no lower ext edema,  2+ radial pulses, Lt shoulder with dressing Neuro:alert and oriented, MAE, follows commands, + facial symmetry   Lab Results:  Recent Labs  09/13/13 0521 09/14/13 0400  WBC 7.8 6.7  HGB 8.8* 9.2*  HCT 25.9* 27.2*  PLT 235 255   BMET  Recent Labs  09/13/13 0521 09/14/13 0400  NA 134* 138  K 5.0 4.3  CL 95* 98  CO2 27 28  GLUCOSE 99 96  BUN 24* 23  CREATININE 1.30 1.39*  CALCIUM 8.9 9.0   No results found for this basename: TROPONINI, CK, MB,  in the last 72 hours  Lab Results  Component Value Date   CHOL 135 10/19/2012   HDL 45 10/19/2012   LDLCALC 75 10/19/2012   TRIG 75 10/19/2012   CHOLHDL 3.0 10/19/2012   No results found for this basename: HGBA1C     Lab Results  Component Value Date   TSH 2.362 09/07/2013       Studies/Results: Mr Shoulder Left Wo Contrast  09/13/2013   CLINICAL DATA:  Severe left shoulder pain. History of rotator cuff tear. Evaluate for possible infection.  EXAM: MRI OF THE LEFT SHOULDER WITHOUT CONTRAST  TECHNIQUE:  Multiplanar, multisequence MR imaging of the shoulder was performed. No intravenous contrast was administered.  COMPARISON:  Radiographs 09/12/2013.  FINDINGS: Rotator cuff: There is a chronic appearing full-thickness rotator cuff tear associated with a high riding humeral head resulting in obliteration of the subacromial space appear There is associated retraction of the supraspinatus and infraspinous tendons. The subscapularis tendon also appears torn and partially retracted.  Muscles: There is diffuse atrophy of the rotator cuff musculature. There is some edema within the supraspinatus muscle. There is also extensive heterogeneous signal within the overlying deltoid muscle, especially anteriorly where there are foci of the low T1 and T2 signal. There is no corresponding calcification on the radiographs, and this may reflect hemorrhage. There is adjacent subcutaneous edema without focal extra-articular fluid collection.  Biceps long head: The intra-articular portion is poorly visualized and likely ruptured.  Acromioclavicular Joint: The acromion is type 2. There are moderate acromioclavicular degenerative changes. As above, the subacromial space is obliterated by the high riding humeral head. There is complex fluid within the subacromial -subdeltoid and the subcoracoid bursa.  Glenohumeral Joint: Moderate size complex joint effusion, likely in part due to synovitis. There are diffuse glenohumeral degenerative changes with osteophytes and chondral thinning.  Labrum: Diffuse degenerative tearing of the labrum, especially superiorly and posteriorly.  Bones: There is degenerative subchondral edema and cyst formation posteriorly in the glenoid. No bone destruction is demonstrated  to suggest osteomyelitis. There is no evidence of acute fracture.  IMPRESSION: 1. Large chronic appearing rotator cuff tear as described with associated tendon retraction and muscular atrophy. 2. Extensive edema and possible hemorrhage  anteriorly within the deltoid muscle. There is adjacent subcutaneous edema as well as associated bursal fluid and a moderate-sized joint effusion. Soft tissue infection cannot be excluded. 3. Glenohumeral degenerative changes with diffuse labral tearing and probable rupture of the intra-articular portion of the biceps tendon. 4. No evidence of osteomyelitis.   Electronically Signed   By: Roxy HorsemanBill  Veazey M.D.   On: 09/13/2013 08:29   Dg Shoulder Left  09/12/2013   CLINICAL DATA:  Left shoulder pain for 2 days, no acute injury  EXAM: LEFT SHOULDER - 2+ VIEW  COMPARISON:  Left shoulder films of 11/06/2010  FINDINGS: There is considerable spurring from the acromion resulting in impingement possible chronic rotator cuff disease. Degenerative spurring also is noted involving the [facility] which may impinge as well. No acute bony abnormality is seen. The left shoulder joint space is only minimally narrowed.  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   Electronically Signed   By: Dwyane DeePaul  Barry M.D.   On: 09/12/2013 15:18   ECHO: Left ventricle: Compared to the echo of 2012 the LVF has declined and the anteroseptum was normal at that time and now appears hypokinetic The cavity size was normal. There was mild focal basal hypertrophy of the septum. The estimated ejection fraction was 45%. Probable mild hypokinesis of the entireanteroseptal myocardium. There was a reduced contribution of atrial contraction to ventricular filling, due to increased ventricular diastolic pressure or atrial contractile dysfunction. Doppler parameters are consistent with a reversible restrictive pattern, indicative of decreased left ventricular diastolic compliance and/or increased left atrial pressure (grade 3 diastolic dysfunction). - Mitral valve: Mild regurgitation. - Left atrium: The atrium was mildly dilated.   Medications: I have reviewed  the patient's current medications. Scheduled Meds: . aspirin EC  81 mg Oral Daily  . atorvastatin  40 mg Oral Daily  . carvedilol  6.25 mg Oral BID WC  . ceFEPime (MAXIPIME) IV  1 g Intravenous Q12H  . citalopram  40 mg Oral Daily  . colchicine  0.6 mg Oral BID  . doxazosin  8 mg Oral QHS  . enoxaparin (LOVENOX) injection  40 mg Subcutaneous Q24H  . furosemide  20 mg Oral Daily  . gabapentin  600 mg Oral TID  . OxyCODONE  20 mg Oral Q12H  . predniSONE  10 mg Oral Q breakfast  . regadenoson  0.4 mg Intravenous Once  . tamsulosin  0.4 mg Oral QPC breakfast  . vitamin C  500 mg Oral Daily   Continuous Infusions:  PRN Meds:.acetaminophen, HYDROmorphone (DILAUDID) injection, loperamide, methocarbamol, nitroGLYCERIN, oxyCODONE, promethazine, sodium chloride  Assessment/Plan: Principal Problem:   Cauda equina syndrome with neurogenic bladder Active Problems:   HYPERTENSION   CAD in native artery   Lumbar degenerative disc disease   Altered mental status   Fever and chills   Dyspnea   UTI (urinary tract infection)   Acute CHF (congestive heart failure)   Elevated troponin   Anemia, iron deficiency   Shoulder pain, left  PLAN: POST OP day 1  Arthroscopic debridement, extensive, with subtotal  synovectomy all for the left shoulder.   Nuc study not done because pt could not lie flat. Resting portion was completed. To be done for new decreased EF to  45% with wall motion abnormality. Troponin elevated at 0.56 Now cannot raise Lt arm.   Pro BNP elevated now improved.  BNP (last 3 results)  Recent Labs  09/08/13 0918 09/10/13 0435 09/11/13 0430  PROBNP 33476.0* 11292.0* 3379.0*   BP elevated. MD to see for further recommendations.  LOS: 9 days   Time spent with pt. 15 minutes. [facility] R  Nurse Practitioner Certified Pager [phone] or after 5pm and on weekends call [phone] 09/14/2013, 9:06 AM   I have examined the patient and reviewed assessment and plan and  discussed with patient.  Agree with above as stated.  Fluid overload improved.  Lying flat without sx.  Now with shoulder problems, he will not be able to lift arm for stress part of nuclear study.  Would cancel nuclear test at this point.  Plan on further cardiac w/u as outpatient.  Can titrate lasix if [facility] returns. Call with questions.  Edder Bellanca S.

## 2013-09-14 NOTE — Progress Notes (Signed)
No pain in left shoulder, mentally more active as well as physical. Continue as per ID and ORTHO

## 2013-09-15 ENCOUNTER — Inpatient Hospital Stay: Payer: Medicare Other | Admitting: Internal Medicine

## 2013-09-15 LAB — CBC
HCT: 26 % — ABNORMAL LOW (ref 39.0–52.0)
Hemoglobin: 8.5 g/dL — ABNORMAL LOW (ref 13.0–17.0)
MCH: 28.5 pg (ref 26.0–34.0)
MCHC: 32.7 g/dL (ref 30.0–36.0)
MCV: 87.2 fL (ref 78.0–100.0)
Platelets: 287 10*3/uL (ref 150–400)
RBC: 2.98 MIL/uL — ABNORMAL LOW (ref 4.22–5.81)
RDW: 15.4 % (ref 11.5–15.5)
WBC: 7.9 10*3/uL (ref 4.0–10.5)

## 2013-09-15 MED ORDER — ENSURE COMPLETE PO LIQD
237.0000 mL | Freq: Two times a day (BID) | ORAL | Status: DC
  Administered 2013-09-15 – 2013-09-20 (×11): 237 mL via ORAL

## 2013-09-15 MED ORDER — ADULT MULTIVITAMIN W/MINERALS CH
1.0000 | ORAL_TABLET | Freq: Every day | ORAL | Status: DC
  Administered 2013-09-15 – 2013-09-20 (×6): 1 via ORAL
  Filled 2013-09-15 (×6): qty 1

## 2013-09-15 NOTE — Progress Notes (Signed)
OT Cancellation Note  Patient Details Name: Ignacia MarvelRonald W Desmith MRN: [id number] DOB: 06/27/1941   Cancelled Treatment:    Reason Eval/Treat Not Completed: Patient at procedure or test/ unavailable (Getting wound dressing change and awaiting ortho PA to return call regarding shoulder ROM precautions)  Blayde Bacigalupi A 09/15/2013, 11:13 AM

## 2013-09-15 NOTE — Progress Notes (Signed)
Subjective: No chest pain or  SHOB   Objective: Vital signs in last 24 hours: Temp:  [97.7 F (36.5 C)-99 F (37.2 C)] 98.7 F (37.1 C) (02/19 0817) Pulse Rate:  [53-95] 59 (02/19 0817) Resp:  [18-20] 20 (02/19 0817) BP: (109-168)/(42-83) 143/44 mmHg (02/19 0817) SpO2:  [92 %-100 %] 100 % (02/19 0817) Weight change:  Last BM Date: 09/14/13 Intake/Output from previous day: -400 02/18 0701 - 02/19 0700 In: 1230 [P.O.:980; IV Piggyback:250] Out: 1050 [Urine:1050] Intake/Output this shift:    PE: General:Pleasant affect, NAD Skin:Warm and dry, brisk capillary refill HEENT:normocephalic, sclera clear, mucus membranes moist Heart:S1S2 RRR without murmur, gallup, rub or click Lungs:clear ant.without rales, rhonchi, or wheezes KGM:WNUUAbd:soft, non tender, + BS,  Ext:no lower ext edema,  2+ radial pulses, Lt shoulder with dressing Neuro:alert and oriented, MAE, follows commands, + facial symmetry   Lab Results:  Recent Labs  09/14/13 0400 09/15/13 0450  WBC 6.7 7.9  HGB 9.2* 8.5*  HCT 27.2* 26.0*  PLT 255 287   BMET  Recent Labs  09/13/13 0521 09/14/13 0400  NA 134* 138  K 5.0 4.3  CL 95* 98  CO2 27 28  GLUCOSE 99 96  BUN 24* 23  CREATININE 1.30 1.39*  CALCIUM 8.9 9.0   No results found for this basename: TROPONINI, CK, MB,  in the last 72 hours  Lab Results  Component Value Date   CHOL 135 10/19/2012   HDL 45 10/19/2012   LDLCALC 75 10/19/2012   TRIG 75 10/19/2012   CHOLHDL 3.0 10/19/2012   No results found for this basename: HGBA1C     Lab Results  Component Value Date   TSH 2.362 09/07/2013       Studies/Results: No results found. ECHO: Left ventricle: Compared to the echo of 2012 the LVF has declined and the anteroseptum was normal at that time and now appears hypokinetic The cavity size was normal. There was mild focal basal hypertrophy of the septum. The estimated ejection fraction was 45%. Probable mild hypokinesis of the  entireanteroseptal myocardium. There was a reduced contribution of atrial contraction to ventricular filling, due to increased ventricular diastolic pressure or atrial contractile dysfunction. Doppler parameters are consistent with a reversible restrictive pattern, indicative of decreased left ventricular diastolic compliance and/or increased left atrial pressure (grade 3 diastolic dysfunction). - Mitral valve: Mild regurgitation. - Left atrium: The atrium was mildly dilated.   Medications: I have reviewed the patient's current medications. Scheduled Meds: . aspirin EC  81 mg Oral Daily  . atorvastatin  40 mg Oral Daily  . carvedilol  6.25 mg Oral BID WC  . ceFEPime (MAXIPIME) IV  1 g Intravenous Q12H  . citalopram  40 mg Oral Daily  . colchicine  0.6 mg Oral BID  . doxazosin  8 mg Oral QHS  . enoxaparin (LOVENOX) injection  40 mg Subcutaneous Q24H  . furosemide  20 mg Oral Daily  . gabapentin  600 mg Oral TID  . OxyCODONE  20 mg Oral Q12H  . predniSONE  10 mg Oral Q breakfast  . regadenoson  0.4 mg Intravenous Once  . tamsulosin  0.4 mg Oral QPC breakfast  . vitamin C  500 mg Oral Daily   Continuous Infusions:  PRN Meds:.acetaminophen, HYDROmorphone (DILAUDID) injection, loperamide, methocarbamol, nitroGLYCERIN, oxyCODONE, promethazine, sodium chloride  Assessment/Plan: Principal Problem:   Cauda equina syndrome with neurogenic bladder Active Problems:   HYPERTENSION   CAD in native artery  Lumbar degenerative disc disease   Altered mental status   Fever and chills   Dyspnea   UTI (urinary tract infection)   Acute CHF (congestive heart failure)   Elevated troponin   Anemia, iron deficiency   Shoulder pain, left  PLAN: POST OP day 1  Arthroscopic debridement, extensive, with subtotal  synovectomy all for the left shoulder.   Nuc study not done because pt could not lie flat. Resting portion was completed. To be done for new decreased EF to 45% with wall motion  abnormality. Troponin elevated at 0.56 Now cannot raise Lt arm.   Pro BNP elevated now improved.  BNP (last 3 results)  Recent Labs  09/08/13 0918 09/10/13 0435 09/11/13 0430  PROBNP 33476.0* 11292.0* 3379.0*   BP elevated mildly. COntinue current meds.   LOS: 10 days    I have examined the patient and reviewed assessment and plan and discussed with patient.  Agree with above as stated.  Fluid overload improved.  Lying flat without sx.  Now with shoulder problems, he will not be able to lift arm for stress part of nuclear study.  Would cancel nuclear test at this point.  Plan on further cardiac w/u as outpatient.  Can titrate lasix if [facility] returns. Worsening anemia today.  Mabrey Howland S.

## 2013-09-15 NOTE — Progress Notes (Signed)
Decrease pain in left shoulder. Case RN looking for new bladder caths. See cardiology consult

## 2013-09-15 NOTE — Progress Notes (Signed)
UR complete.  Shebra Muldrow RN, MSN 

## 2013-09-15 NOTE — Progress Notes (Signed)
Rehab Admissions Coordinator Note:  Patient was screened by Clois DupesBoyette, Elsie Sakuma Godwin for appropriateness for an Inpatient Acute Rehab Consult.  At this time, we are recommending Inpatient Rehab consult.  Clois DupesBoyette, Athina Fahey Godwin 09/15/2013, 2:12 PM  I can be reached at [phone].

## 2013-09-15 NOTE — Progress Notes (Addendum)
Physical Therapy Note  Pt requiring increased A today due to limitations with L UE.  At this time pt is not safe to return to home and would benefit from CIR at D/C to maximize independence prior to returning to home with wife.  Will continue to follow.     09/14/13 1400  PT Visit Information  Last PT Received On 09/15/13  Assistance Needed +2  History of Present Illness patient who this am started to complain of shakeness, diaphoresis, decrease of loc, increase ot temperature. [facility]  [facility] he had a mri of the lumbar spine. Started on abs and [facility]. He does not know why is here, has some mild headache and some pain in the left foot(hx of gout  PT Time Calculation  PT Start Time 1355  PT Stop Time 1426  PT Time Calculation (min) 31 min  Subjective Data  Subjective I can' do much with this arm.    Precautions  Precautions Back;Fall;Shoulder  Type of Shoulder Precautions Post Shoulder I+D  Shoulder Interventions Shoulder sling/immobilizer;For comfort  Precaution Comments Verbalize and adheres to 3/3 back precautions  Required Braces or Orthoses Sling  Restrictions  Weight Bearing Restrictions No  Other Position/Activity Restrictions None order, but discussed minimizing WBing on L shoulder.    Cognition  Arousal/Alertness Awake/alert  Behavior During Therapy WFL for tasks assessed/performed  Overall Cognitive Status Within Functional Limits for tasks assessed  Bed Mobility  Overal bed mobility Needs Assistance  Bed Mobility Rolling;Sidelying to Sit  Rolling Min assist  Sidelying to sit Mod assist  General bed mobility comments cues to minimize use of L shoulder with pulling up to sitting on bed rail.    Transfers  Overall transfer level Needs assistance  Equipment used 2 person hand held assist  Transfers Sit to/from BJ'sStand;Stand Pivot Transfers  Sit to Stand Mod assist;+2 physical assistance  Stand pivot transfers Mod assist;+2  physical assistance  General transfer comment pt uses momentum, but requires increased A today 2/2 decreased use of L UE.    Balance  Overall balance assessment Needs assistance  Sitting-balance support Feet supported;Single extremity supported  Sitting balance-Leahy Scale Fair  Standing balance support During functional activity  Standing balance-Leahy Scale Poor  PT - End of Session  Equipment Utilized During Treatment Gait belt  Activity Tolerance Patient tolerated treatment well  Patient left in chair;with call bell/phone within reach;with chair alarm set  Nurse Communication Mobility status  PT - Assessment/Plan  PT Plan Current plan remains appropriate  PT Frequency Min 3X/week  Follow Up Recommendations CIR  PT equipment None recommended by PT  PT Goal Progression  Progress towards PT goals Not progressing toward goals - comment  Acute Rehab PT Goals  Time For Goal Achievement 09/20/13  Potential to Achieve Goals Good  PT General Charges  $$ ACUTE PT VISIT 1 Procedure  PT Treatments  $Therapeutic Activity 23-37 mins   Dicky Boer, PT [phone]

## 2013-09-15 NOTE — Progress Notes (Signed)
Upon assessment, no foley catheter was noted. Report given that foley catheter was removed approximately at 1600. Patient report with history of self cath. Will continue to monitor,  Vick Filter, RN.

## 2013-09-15 NOTE — Consult Note (Signed)
Gar GibbonAshley Salahuddin Arismendez, Student Nurse [place] [employer] Marinda ElkSonja Wilson, RN, EdD  [employer]

## 2013-09-15 NOTE — Progress Notes (Signed)
Patient ID: [name], male   DOB: May 17, 1941, 73 y.o.   MRN: [id number]         [facility]    Date of Admission:  09/05/2013    Total days of antibiotics 48        Day 10 cefepime  Principal Problem:   Cauda equina syndrome with neurogenic bladder Active Problems:   Fever and chills   Lumbar degenerative disc disease   HYPERTENSION   CAD in native artery   Altered mental status   Dyspnea   UTI (urinary tract infection)   Acute CHF (congestive heart failure)   Elevated troponin   Anemia, iron deficiency   Shoulder pain, left   . aspirin EC  81 mg Oral Daily  . atorvastatin  40 mg Oral Daily  . carvedilol  6.25 mg Oral BID WC  . ceFEPime (MAXIPIME) IV  1 g Intravenous Q12H  . citalopram  40 mg Oral Daily  . colchicine  0.6 mg Oral BID  . doxazosin  8 mg Oral QHS  . enoxaparin (LOVENOX) injection  40 mg Subcutaneous Q24H  . feeding supplement (ENSURE COMPLETE)  237 mL Oral BID BM  . furosemide  20 mg Oral Daily  . gabapentin  600 mg Oral TID  . multivitamin with minerals  1 tablet Oral Daily  . OxyCODONE  20 mg Oral Q12H  . predniSONE  10 mg Oral Q breakfast  . regadenoson  0.4 mg Intravenous Once  . tamsulosin  0.4 mg Oral QPC breakfast  . vitamin C  500 mg Oral Daily   Objective: Temp:  [97.1 F (36.2 C)-99 F (37.2 C)] 97.1 F (36.2 C) (02/19 1007) Pulse Rate:  [54-95] 54 (02/19 1007) Resp:  [18-20] 18 (02/19 1007) BP: (109-168)/(42-83) 132/53 mmHg (02/19 1007) SpO2:  [92 %-100 %] 99 % (02/19 1007)  Lab Results Lab Results  Component Value Date   WBC 7.9 09/15/2013   HGB 8.5* 09/15/2013   HCT 26.0* 09/15/2013   MCV 87.2 09/15/2013   PLT 287 09/15/2013    Lab Results  Component Value Date   CREATININE 1.39* 09/14/2013   BUN 23 09/14/2013   NA 138 09/14/2013   K 4.3 09/14/2013   CL 98 09/14/2013   CO2 28 09/14/2013    Lab Results  Component Value Date   ALT 11 09/05/2013   AST 17 09/05/2013   ALKPHOS 57 09/05/2013   BILITOT  0.3 09/05/2013      Microbiology: Recent Results (from the past 240 hour(s))  URINE CULTURE     Status: None   Collection Time    09/05/13  1:35 PM      Result Value Ref Range Status   Specimen Description URINE, CATHETERIZED   Final   Special Requests NONE   Final   Culture  Setup Time     Final   Value: 09/05/2013 21:02     Performed at [facility] Count     Final   Value: >=100,000 COLONIES/ML     Performed at [facility]   Culture     Final   Value: ESCHERICHIA COLI     Performed at [facility]   Report Status 09/07/2013 FINAL   Final   Organism ID, Bacteria ESCHERICHIA COLI   Final  CULTURE, BLOOD (ROUTINE X 2)     Status: None   Collection Time    09/05/13 11:15 PM  Result Value Ref Range Status   Specimen Description BLOOD RIGHT ANTECUBITAL   Final   Special Requests BOTTLES DRAWN AEROBIC ONLY 10CC   Final   Culture  Setup Time     Final   Value: 09/06/2013 03:28     Performed at [facility]   Culture     Final   Value: NO GROWTH 5 DAYS     Performed at [facility]   Report Status 09/12/2013 FINAL   Final  CULTURE, BLOOD (ROUTINE X 2)     Status: None   Collection Time    09/05/13 11:20 PM      Result Value Ref Range Status   Specimen Description BLOOD RIGHT HAND   Final   Special Requests BOTTLES DRAWN AEROBIC ONLY 10CC   Final   Culture  Setup Time     Final   Value: 09/06/2013 03:28     Performed at [facility]   Culture     Final   Value: NO GROWTH 5 DAYS     Performed at [facility]   Report Status 09/12/2013 FINAL   Final  MRSA PCR SCREENING     Status: None   Collection Time    09/05/13 11:50 PM      Result Value Ref Range Status   MRSA by PCR NEGATIVE  NEGATIVE Final   Comment:            The GeneXpert MRSA Assay (FDA     approved for NASAL specimens     only), is one component of a     comprehensive MRSA colonization     surveillance program. It is not     intended to  diagnose MRSA     infection nor to guide or     monitor treatment for     MRSA infections.  CLOSTRIDIUM DIFFICILE BY PCR     Status: None   Collection Time    09/10/13 11:21 PM      Result Value Ref Range Status   C difficile by pcr NEGATIVE  NEGATIVE Final  BODY FLUID CULTURE     Status: None   Collection Time    09/13/13  1:34 PM      Result Value Ref Range Status   Specimen Description SYNOVIAL FLUID SHOULDER LEFT   Final   Special Requests PT ON MAXIPIME   Final   Gram Stain     Final   Value: NO WBC SEEN     NO ORGANISMS SEEN     Performed at [facility]   Culture     Final   Value: NO GROWTH 1 DAY     Performed at [facility]   Report Status PENDING   Incomplete  ANAEROBIC CULTURE     Status: None   Collection Time    09/13/13  1:34 PM      Result Value Ref Range Status   Specimen Description SYNOVIAL FLUID SHOULDER LEFT   Final   Special Requests PT ON MAXIPIME   Final   Gram Stain     Final   Value: NO WBC SEEN     NO ORGANISMS SEEN     Performed at [facility]   Culture     Final   Value: No Anaerobes Isolated; Culture in Progress for 14 Days     Performed at [facility]   Report Status PENDING   Incomplete    Studies/Results: No  results found.  Assessment:  There is no evidence that he has a septic left shoulder. Recommend continuing cefepime for 4 more days as planned for his urinary tract infection.  Plan: 1. Continue cefepime for 4 more days 2. I will sign off now  Cliffton Asters, MD [facility] for Infect[facility] Disease [facility] [phone] pager   [phone] cell 09/15/2013, 10:52 AM

## 2013-09-15 NOTE — Consult Note (Signed)
Removed old dressing (old bloody guaze). Dry, intact, no edema, no redness, no drainage. Cleaned entire area with sterile water. Applied guaze and abd pad to area. Pt tolerated procedure well. Gar GibbonAshley Collen Vincent, Student Nurse/ Marinda ElkSonja Wilson, RN, EDD, [place] [employer]

## 2013-09-16 ENCOUNTER — Encounter (HOSPITAL_COMMUNITY): Payer: Self-pay | Admitting: Orthopedic Surgery

## 2013-09-16 LAB — BASIC METABOLIC PANEL
BUN: 28 mg/dL — ABNORMAL HIGH (ref 6–23)
CO2: 29 mEq/L (ref 19–32)
Calcium: 9.4 mg/dL (ref 8.4–10.5)
Chloride: 98 mEq/L (ref 96–112)
Creatinine, Ser: 1.14 mg/dL (ref 0.50–1.35)
GFR, EST AFRICAN AMERICAN: 73 mL/min — AB (ref >90)
GFR, EST NON AFRICAN AMERICAN: 63 mL/min — AB (ref >90)
GLUCOSE: 114 mg/dL — AB (ref 70–99)
Potassium: 4.9 mEq/L (ref 3.7–5.3)
Sodium: 139 mEq/L (ref 137–147)

## 2013-09-16 NOTE — Clinical Social Work Placement (Signed)
Clinical Social Work Department CLINICAL SOCIAL WORK PLACEMENT NOTE 09/16/2013  Patient:  [name],[name]  Account Number:  [id number] Admit date:  09/05/2013  Clinical Social Worker:  Read DriversEGINA Jennessa Trigo, LCSWA  Date/time:  09/16/2013 02:38 PM  Clinical Social Work is seeking post-discharge placement for this patient at the following level of care:   SKILLED NURSING   (*CSW will update this form in Epic as items are completed)   09/16/2013  Patient/family provided with [facility] Department of Clinical Social Work's list of facilities offering this level of care within the geographic area requested by the patient (or if unable, by the patient's family).  09/16/2013  Patient/family informed of their freedom to choose among providers that offer the needed level of care, that participate in Medicare, Medicaid or managed care program needed by the patient, have an available bed and are willing to accept the patient.  09/16/2013  Patient/family informed of [facility]' ownership interest in [facility], as well as of the fact that they are under no obligation to receive care at this facility.  PASARR submitted to EDS on 09/16/2013 PASARR number received from EDS on 09/16/2013  FL2 transmitted to all facilities in geographic area requested by pt/family on  09/16/2013 FL2 transmitted to all facilities within larger geographic area on 09/16/2013  Patient informed that his/her managed care company has contracts with or will negotiate with  certain facilities, including the following:     Patient/family informed of bed offers received:   Patient chooses bed at  Physician recommends and patient chooses bed at    Patient to be transferred to  on   Patient to be transferred to facility by   The following physician request were entered in Epic:   Additional Comments:

## 2013-09-16 NOTE — Progress Notes (Signed)
Patient prefers to not seek readmission to inpt rehab at this time per PT. [facility]. RN CM aware. [phone]

## 2013-09-16 NOTE — Progress Notes (Signed)
Physical Therapy Treatment Patient Details Name: [name] MRN: [id number] DOB: June 04, 1941 Today's Date: 09/16/2013 Time: 4098-1191 PT Time Calculation (min): 17 min  PT Assessment / Plan / Recommendation  History of Present Illness patient who this am started to complain of shakeness, diaphoresis, decrease of loc, increase ot temperature. [facility]  [facility] he had a mri of the lumbar spine. Started on abs and we [facility]. He does not know why is here, has some mild headache and some pain in the left foot(hx of gout   PT Comments   Attempted to work on progressing gait with patient however within a couple feet of ambulation patient become shakey with BLE weakness and attempted to sit after becoming very fearful of falling. Attempted to reassure patient to attempt ambulation once more but patient became fearful and anxious despite +2 assistance. Patient has decided to not attempt to get into CIR her at [facility] but prefers SNF at [facility]. CIR RN and Case Manager made aware.   Follow Up Recommendations  SNF     Does the patient have the potential to tolerate intense rehabilitation     Barriers to Discharge        Equipment Recommendations  None recommended by PT    Recommendations for Other Services    Frequency Min 3X/week   Progress towards PT Goals Progress towards PT goals: Progressing toward goals  Plan Discharge plan needs to be updated    Precautions / Restrictions Precautions Precautions: Back;Fall Type of Shoulder Precautions: 2/17: Post Shoulder I+D without any restrictions post op per Leila Early, OTR/L speaking to [facility], PA-C (ortho) Shoulder Interventions: Shoulder sling/immobilizer;For comfort Precaution Comments: Verbalize and adheres to 3/3 back precautions Required Braces or Orthoses: Sling Restrictions LUE Weight Bearing: Weight bearing as tolerated Other Position/Activity Restrictions: None order, but  discussed minimizing WBing on L shoulder.     Pertinent Vitals/Pain Denied pain but became very fearful with ambulation   Mobility  Bed Mobility Overal bed mobility: Needs Assistance Rolling: Supervision Sidelying to sit: Min assist General bed mobility comments: Cues for correct and safe sequence. A from L side of bed this session Transfers Overall transfer level: Needs assistance Equipment used: Rolling walker (2 wheeled) Sit to Stand: Mod assist General transfer comment: pt uses momentum, but requires increased A today 2/2 decreased use of L UE.  Patient cued on best hand placement this session Ambulation/Gait Ambulation/Gait assistance: Mod assist;+2 safety/equipment Ambulation Distance (Feet): 6 Feet Assistive device: Rolling walker (2 wheeled) Gait Pattern/deviations: Step-to pattern;Narrow base of support;Drifts right/left;Ataxic Gait velocity: slow General Gait Details: Patient able to don L AFO and shoes this session. Patient with noted weakness in BLEs and very ataxic. Patient became very anxious about what appeared to be new weakness in R LEs and patient attempting to sit without chair behind him and was too anxious to attempt anymore ambulation.     Exercises     PT Diagnosis:    PT Problem List:   PT Treatment Interventions:     PT Goals (current goals can now be found in the care plan section)    Visit Information  Last PT Received On: 09/16/13 Assistance Needed: +2 History of Present Illness: patient who this am started to complain of shakeness, diaphoresis, decrease of loc, increase ot temperature. [facility]  [facility] he had a mri of the lumbar spine. Started on abs and we [facility]. He does not know why is here, has some mild headache and some pain in the left foot(hx of gout    Subjective Data      Cognition  Cognition Arousal/Alertness: Awake/alert Behavior During Therapy: Anxious Overall Cognitive Status: Within  Functional Limits for tasks assessed    Balance  Balance Sitting balance-Leahy Scale: Fair Standing balance-Leahy Scale: Poor  End of Session PT - End of Session Equipment Utilized During Treatment: Gait belt Activity Tolerance: Patient tolerated treatment well Patient left: in chair;with call bell/phone within reach;with chair alarm set Nurse Communication: Mobility status   GP     Fredrich BirksRobinette, Julia Elizabeth 09/16/2013, 10:47 AM 09/16/2013 Fredrich Birksobinette, Julia Elizabeth PTA [phone] pager [phone] office

## 2013-09-17 MED ORDER — ASPIRIN 81 MG PO CHEW
CHEWABLE_TABLET | ORAL | Status: AC
  Administered 2013-09-17: 81 mg
  Filled 2013-09-17: qty 1

## 2013-09-17 NOTE — Progress Notes (Signed)
Patient ID: [name], male   DOB: 02/24/1941, 73 y.o.   MRN: [id number] Much better. No pain in the left shoulder.neuro stable. Continue as per Ortho  And ID. REQUESTING A TRANSFER TO A [facility] rehab facility

## 2013-09-17 NOTE — Progress Notes (Signed)
C/O bladder fullness; small incontinence on the linen; can't void on his own; In and Out cath. Using sterile tech. Obtained clear yellow urine; tolerated without distress; reports self cath.s about every 4-6 hours.

## 2013-09-17 NOTE — Progress Notes (Signed)
Clinical Child psychotherapistocial Worker (CSW) gave patient bed offers for short term rehab. Patient has [facility] bed offers [facility]. Patient reported that he would only [facility] or [facility]. CSW explained that [facility] is not seeking admissions because per rehab admissions note patient would like to pursue [facility]. CSW encouraged patient to think about a back up plan if the [facility] did not have a bed available. Patient reported that he would go home with home health if he could not get a bed [facility] the [facility]. CSW left message for [facility] admissions coordinator.   Jetta LoutBailey [facility], LCSWA Weekend CSW [phone]

## 2013-09-17 NOTE — Progress Notes (Signed)
Agree with PTA.    Court Gracia, PT [phone]  

## 2013-09-18 LAB — CBC
HCT: 28.5 % — ABNORMAL LOW (ref 39.0–52.0)
HEMOGLOBIN: 9.1 g/dL — AB (ref 13.0–17.0)
MCH: 28.2 pg (ref 26.0–34.0)
MCHC: 31.9 g/dL (ref 30.0–36.0)
MCV: 88.2 fL (ref 78.0–100.0)
Platelets: 385 10*3/uL (ref 150–400)
RBC: 3.23 MIL/uL — ABNORMAL LOW (ref 4.22–5.81)
RDW: 15.5 % (ref 11.5–15.5)
WBC: 9.1 10*3/uL (ref 4.0–10.5)

## 2013-09-18 NOTE — Progress Notes (Signed)
Clinical Social Work Department BRIEF PSYCHOSOCIAL ASSESSMENT 09/18/2013  Patient:  [name], [name]     Account Number:  [id number]     Admit date:  09/05/2013  Clinical Social Worker:  Rolinda Roan  Date/Time:  09/18/2013 06:04 PM  Referred by:  Physician  Date Referred:  09/16/2013 Referred for  SNF Placement   Other Referral:   Interview type:  Patient Other interview type:    PSYCHOSOCIAL DATA Living Status:  WIFE Admitted from facility:   Level of care:   Primary support name:  Pam [phone] Primary support relationship to patient:  CHILD, ADULT Degree of support available:   Very supportive.    CURRENT CONCERNS  Other Concerns:    SOCIAL WORK ASSESSMENT / PLAN Clinical Social Worker (CSW) met with patient and daughter [name] at bedside. They reported that they want the [facility] or CIR. A referral was sent to the [facility] on Friday and resent on Saturday. CSW left 2 messages for the [facility] this weekend. Daughter reported that she would transport patient in her personal vehicle or her brother would on the day of D/C.   Assessment/plan status:  Psychosocial Support/Ongoing Assessment of Needs Other assessment/ plan:   Information/referral to community resources:    PATIENT'S/FAMILY'S RESPONSE TO PLAN OF CARE: Daughter reported that their family has been through a lot the last couple of months. Patient has been in an out of the hospital several times this year. CSW provided emotional support and answered all patient and daughter's questions.

## 2013-09-18 NOTE — Progress Notes (Signed)
Plans for transfer to SNF noted. Available as needed.  Crista Curborinna Lathen Seal, M.D. [facility] [phone]

## 2013-09-18 NOTE — Progress Notes (Signed)
Clinical Social Worker (CSW) received call from RN stating that patient's daughter [name] at [phone] wanted to speak to CSW. CSW contacted Pam and left a message.   Jetta LoutBailey Morgan, LCSWA Weekend CSW [phone]

## 2013-09-18 NOTE — Progress Notes (Signed)
Clinical Education officer, museum (CSW) received call from RN stating that patient's daughter Jeannene Patella is at bedside and would like to speak to [place]. CSW met with Pam and patient. Pam reported that she really wants the patient to go to [facility] or CIR. CSW explained that a referral via Arrie Senate was sent to the [facility] on Friday 09/16/13 and CSW resent the referral in carefinderpro on Saturday 2//21/15. CSW left 2 messages for admissions at [facility]. CSW asked RN to put in another CIR consult. Pam and patient reported that this has been a hard couple of months and patient has been in and out of the hospital several times. Daughter reported that patient cannot go to a [facility] because it would be too far away from the family. CSW explained to daughter and patient that since he has been to rehab before he might not have 100 full medicare days left unless he has had a 60 day wellness period. Patient reported that he has been in rehab for a total of 30 days this year and has not had a 60 day wellness period. Patient and daughter verbalized their understanding that patient does not have 100 full medicare days left for rehab and they will have to private pay once the medicare days run out. Weekday CSW will assist with D/C.  Daughter Pam [phone]   Blima Rich, Latanya Presser Weekend [place]

## 2013-09-18 NOTE — Progress Notes (Signed)
Subjective: Patient reports [facility]  Objective: Vital signs in last 24 hours: Temp:  [97.6 F (36.4 C)-98 F (36.7 C)] 97.8 F (36.6 C) (02/22 0950) Pulse Rate:  [50-89] 50 (02/22 0950) Resp:  [16-20] 18 (02/22 0950) BP: (90-149)/(38-73) 135/50 mmHg (02/22 0950) SpO2:  [97 %-100 %] 100 % (02/22 0950)  Intake/Output from previous day: 02/21 0701 - 02/22 0700 In: 310 [P.O.:300; I.V.:10] Out: 4100 [Urine:4100] Intake/Output this shift:    Physical Exam: Stable  Lab Results:  Recent Labs  09/18/13 0632  WBC 9.1  HGB 9.1*  HCT 28.5*  PLT 385   BMET  Recent Labs  09/16/13 0332  NA 139  K 4.9  CL 98  CO2 29  GLUCOSE 114*  BUN 28*  CREATININE 1.14  CALCIUM 9.4    Studies/Results: No results found.  Assessment/Plan: Continue with PT.  Awaiting Rehab transfer, hopefully in am.    LOS: 13 days    Faris Coolman D, MD 09/18/2013, 11:02 AM

## 2013-09-19 NOTE — Progress Notes (Signed)
Occupational Therapy Treatment Patient Details Name: [name] MRN: [id number] DOB: 10/18/1940 Today's Date: 09/19/2013 Time: [id number] OT Time Calculation (min): 32 min  OT Assessment / Plan / Recommendation  History of present illness patient who this am started to complain of shakeness, diaphoresis, decrease of loc, increase ot temperature. [facility]  [facility] a mri of the lumbar spine. Started on abs and [facility]. He does not know why is here, has some mild headache and some pain in the left foot(hx of gout   OT comments  Pt performed LUE exercises as well as ADLs during session. Pt progressing towards goals and planning to d/c to SNF.   Follow Up Recommendations  SNF    Barriers to Discharge       Equipment Recommendations  None recommended by OT    Recommendations for Other Services    Frequency Min 2X/week   Progress towards OT Goals Progress towards OT goals: Progressing toward goals  Plan Discharge plan needs to be updated    Precautions / Restrictions Precautions Precautions: Back;Fall Type of Shoulder Precautions: 2/17: Post Shoulder I+D without any restrictions post op per Leila Early, OTR/L speaking to [facility] [name], PA-C (ortho) Shoulder Interventions: Shoulder sling/immobilizer;For comfort Precaution Booklet Issued: No Precaution Comments: Reviewed precautions Required Braces or Orthoses: Sling (for comfort) Restrictions Weight Bearing Restrictions: Yes LUE Weight Bearing: Weight bearing as tolerated   Pertinent Vitals/Pain No pain reported.     ADL  Grooming: Teeth care;Min guard Where Assessed - Grooming: Supported standing;Supported sitting Upper Body Dressing: Minimal assistance Where Assessed - Upper Body Dressing: Supported sitting Lower Body Dressing: Minimal assistance Where Assessed - Lower Body Dressing: Supported sit to Pharmacist, hospitalstand Toilet Transfer: Min Sports administratorguard;Minimal assistance Toilet Transfer  Method: Sit to Baristastand Toilet Transfer Equipment: Bedside commode Equipment Used: Gait belt;Rolling walker;Reacher Transfers/Ambulation Related to ADLs: Min/Mod A for ambulation; Min A/Min guard for transfers. ADL Comments: Spoke about having reacher on walker (velcro). Recommended sitting for dressing. Pt practiced LB dressing with cues for precautions. Cues for dressing technique and told pt that button up shirts may be easier for him. Performed grooming at sink. Educated on use of cup for teeth care to help maintain precautions. Performed LUE exercises.   OT Diagnosis:    OT Problem List:   OT Treatment Interventions:     OT Goals(current goals can now be found in the care plan section) Acute Rehab OT Goals Patient Stated Goal: not stated OT Goal Formulation: With patient Time For Goal Achievement: 09/22/13 Potential to Achieve Goals: Good  Visit Information  Last OT Received On: 09/19/13 Assistance Needed: +2 (for safety with increased ambulation) History of Present Illness: patient who this am started to complain of shakeness, diaphoresis, decrease of loc, increase ot temperature. [facility]  [facility] he had a mri of the lumbar spine. Started on abs and [facility]. He does not know why is here, has some mild headache and some pain in the left foot(hx of gout    Subjective Data      Prior Functioning       Cognition  Cognition Arousal/Alertness: Awake/alert Behavior During Therapy: WFL for tasks assessed/performed Overall Cognitive Status: Within Functional Limits for tasks assessed    Mobility  Bed Mobility Overal bed mobility: Needs Assistance Bed Mobility: Rolling;Sidelying to Sit;Sit to Sidelying Rolling: Supervision;Min assist Sidelying to sit: Supervision Sit to sidelying:  Min assist General bed mobility comments: Assist with LE's when going to sidelying position. Transfers Overall transfer level: Needs assistance Equipment  used: Rolling walker (2 wheeled) Transfers: Sit to/from Stand Sit to Stand: Min guard;Min assist General transfer comment: Min A for stand to sit transfers and Min guard for sit to stand.    Exercises  Shoulder Exercises Shoulder Flexion: AAROM;Left;Supine (25) Shoulder Extension: AAROM;Left;Supine (25) Shoulder ABduction: AAROM;Left;20 reps;Seated;Supine Shoulder External Rotation: AROM;Left;10 reps;Supine Elbow Flexion: AROM;Left;10 reps;Supine Elbow Extension: AROM;Left;10 reps;Supine Wrist Flexion: AROM;Left;10 reps;Supine Wrist Extension: AROM;Left;10 reps;Supine Digit Composite Flexion: AROM;Left;10 reps;Supine Composite Extension: AROM;Left;10 reps;Supine   Balance    End of Session OT - End of Session Equipment Utilized During Treatment: Gait belt;Rolling walker Activity Tolerance: Patient tolerated treatment well Patient left: in bed;with call bell/phone within reach;with bed alarm set  GO     Earlie Raveling  OTR/L [phone] 09/19/2013, 4:17 PM

## 2013-09-19 NOTE — Progress Notes (Signed)
NUTRITION FOLLOW UP  Intervention:   Encourage PO intake Continue Ensure Complete BID Continue Multivitamin with minerals daily RD to continue to monitor  Nutrition Dx:   Inadequate oral intake related to decreased appetite as evidenced by varied PO intake 10-100% of meals; ongoing  Goal:   Pt to meet >/= 90% of their estimated nutrition needs; improving  Monitor:   Diet advancement/PO intake, weight, labs  Assessment:   73 y.o. male with spinal stenosis and degenerative disc disease who underwent lumbar surgery in November. It was complicated by cauda equina syndrome and CSF leak. He was readmitted to the hospital in early January with severe, increased back pain. MRI revealed fluid in the operative bed. He underwent incision and drainage. Pt presented 2/9 with complaints shakeness, diaphoresis, decrease of loc, increase ot temperature.  Pt's diet was advanced back to heart healthy diet on 2/17. Per nursing notes pt is eating 50-75% of most meals. No new weight on pt since 2/9. Pt states that his appetite is poor and he is eating less than usual. He likes the Ensure supplements and has been drinking them daily. Encouraged PO intake and continued use of Ensure.   Height: Ht Readings from Last 1 Encounters:  09/05/13 6' (1.829 m)    Weight Status:   Wt Readings from Last 1 Encounters:  09/05/13 198 lb 6.6 oz (90 kg)    Re-estimated needs:  Kcal: 2100-2300  Protein: 105-115 grams  Fluid: 2.1-2.3 L/day  Skin: Stage 2 pressure ulcer on sacrum   Diet Order: Cardiac   Intake/Output Summary (Last 24 hours) at 09/19/13 1055 Last data filed at 09/19/13 0820  Gross per 24 hour  Intake    720 ml  Output   2350 ml  Net  -1630 ml    Last BM: 2/20   Labs:   Recent Labs Lab 09/13/13 0521 09/14/13 0400 09/16/13 0332  NA 134* 138 139  K 5.0 4.3 4.9  CL 95* 98 98  CO2 27 28 29   BUN 24* 23 28*  CREATININE 1.30 1.39* 1.14  CALCIUM 8.9 9.0 9.4  GLUCOSE 99 96 114*     CBG (last 3)  No results found for this basename: GLUCAP,  in the last 72 hours  Scheduled Meds: . aspirin EC  81 mg Oral Daily  . atorvastatin  40 mg Oral Daily  . carvedilol  6.25 mg Oral BID WC  . citalopram  40 mg Oral Daily  . colchicine  0.6 mg Oral BID  . doxazosin  8 mg Oral QHS  . enoxaparin (LOVENOX) injection  40 mg Subcutaneous Q24H  . feeding supplement (ENSURE COMPLETE)  237 mL Oral BID BM  . furosemide  20 mg Oral Daily  . gabapentin  600 mg Oral TID  . multivitamin with minerals  1 tablet Oral Daily  . OxyCODONE  20 mg Oral Q12H  . predniSONE  10 mg Oral Q breakfast  . regadenoson  0.4 mg Intravenous Once  . tamsulosin  0.4 mg Oral QPC breakfast  . vitamin C  500 mg Oral Daily    Continuous Infusions:   Ian Malkineanne Barnett RD, LDN Inpatient Clinical Dietitian Pager: [phone] After Hours Pager: [phone]

## 2013-09-19 NOTE — Progress Notes (Signed)
09/15/13 1350  OT Visit Information  Assistance Needed +2 (If going any distance to follow wtih recliner)  History of Present Illness patient who this am started to complain of shakeness, diaphoresis, decrease of loc, increase ot temperature. [facility]  [facility] he had a mri of the lumbar spine. Started on abs and [facility]. He does not know why is here, has some mild headache and some pain in the left foot(hx of gout  Precautions  Precautions Back;Fall  Type of Shoulder Precautions 2/17: Post Shoulder I+D without any restrictions post op per Leila Early, OTR/L speaking to [facility], PA-C (ortho)  Shoulder Interventions Shoulder sling/immobilizer;For comfort  Precaution Comments Verbalize and adheres to 3/3 back precautions  Restrictions  Weight Bearing Restrictions No  Home Living  Family/patient expects to be discharged to: Private residence  Living Arrangements Spouse/significant other  Available Help at Discharge Family;Available 24 hours/day  Type of Home House  Home Access Ramped entrance  Entrance Stairs-Number of Steps 1 from back entrance; 3-4 from front entrance  Entrance Stairs-Rails None  Home Layout One level  Bathroom Shower/Tub Tub/shower unit;Curtain  Shower/tub characteristics Curtain  Horticulturist, commercial Yes  How Accessible Accessible via wheelchair  Home Equipment Wheelchair - manual;Walker - 2 wheels;BSC;Tub bench;Grab bars - tub/shower;Hand held Building control surveyor  Additional Comments Patient has hip kit at home  Lives With Spouse  Prior Function  Level of Independence Independent  Comments with increased time able to dress and bath with adaptive equip. pt was working with PT/OT at home. pt indep from w/c  Communication  Communication No difficulties  Cognition  Arousal/Alertness Awake/alert  Behavior During Therapy WFL for tasks assessed/performed  Overall  Cognitive Status Within Functional Limits for tasks assessed  Upper Extremity Assessment  Upper Extremity Assessment LUE deficits/detail  RUE Deficits / Details old shoulder surgery, but with full AROM  LUE Deficits / Details In supine, pt cannot flex shoulder or abduct shoulder without A, once placed in these positions pt needs Min A to maintain (no greater than 2 seconds); rest of UE 4-5/5  LUE Coordination decreased gross motor  ADL  Eating/Feeding Set up  Where Assessed - Eating/Feeding Bed level  Grooming Minimal assistance  Where Assessed - Grooming Unsupported sitting  Upper Body Bathing Minimal assistance  Where Assessed - Upper Body Bathing Unsupported sitting  Lower Body Bathing Minimal assistance  Where Assessed - Lower Body Bathing Supported sit to stand  Where Assessed - Upper Body Dressing Unsupported sitting  Lower Body Dressing Moderate assistance  Where Assessed - Lower Body Dressing Unsupported sit to stand  Toilet Transfer Moderate assistance  Toilet Transfer Method Stand pivot  Toilet Transfer Equipment (Bed>3 feet to recliner)  Toileting - Clothing Manipulation and Hygiene Minimal assistance  Where Assessed - Toileting Clothing Manipulation and Hygiene Standing  Transfers/Ambulation Related to ADLs Mod A sit<>stand and ambulation with RW  ADL Comments Pt can cross his legs to get to his feet, but still struggles for B/D  Vision - History  Patient Visual Report No change from baseline  Bed Mobility  Overal bed mobility Needs Assistance  Bed Mobility Sidelying to Sit;Rolling  Rolling Supervision  Sidelying to sit Supervision  General bed mobility comments Cues for correct and safe sequence  Transfers  Overall transfer level Needs assistance  Equipment used Rolling walker (2 wheeled)  Transfers Sit to/from Stand  Sit to Stand Mod assist  Stand pivot transfers Mod assist  OT - End of Session  Equipment Utilized During Treatment Gait belt;Rolling walker   Activity Tolerance Patient limited by fatigue  Patient left in chair;with call bell/phone within reach  Nurse Communication Mobility status  OT Assessment  OT Recommendation/Assessment Patient needs continued OT Services  OT Problem List Decreased strength;Decreased range of motion;Decreased activity tolerance;Impaired balance (sitting and/or standing);Impaired UE functional use  OT Therapy Diagnosis  Generalized weakness  OT Plan  OT Frequency Min 2X/week  OT Treatment/Interventions Self-care/ADL training;DME and/or AE instruction;Therapeutic activities;Patient/family education;Balance training  OT Recommendation  Recommendations for Other Services Rehab consult  Follow Up Recommendations CIR  OT Equipment None recommended by OT  Individuals Consulted  Consulted and Agree with Results and Recommendations Patient  Acute Rehab OT Goals  Patient Stated Goal rehab then home (hopeful for inpatient rehab here again)  OT Goal Formulation With patient  Time For Goal Achievement 09/22/13  Potential to Achieve Goals Good  OT Time Calculation  OT Start Time 5300  OT Stop Time 1334  OT Time Calculation (min) 36 min  OT General Charges  $OT Visit 1 Procedure  OT Evaluation  $Initial OT Evaluation Tier I 1 Procedure  OT Treatments  $Self Care/Home Management  8-22 mins  $Therapeutic Exercise 8-22 mins  Written Expression  Dominant Hand Left  Late entry for 09/15/2013 at 13:50 [place], OTR/L [phone] 09/19/2013

## 2013-09-19 NOTE — Progress Notes (Signed)
Physical Therapy Treatment Patient Details Name: [name] MRN: [id number] DOB: [birth year] Today's Date: 09/19/2013 Time: 0920-0936 PT Time Calculation (min): 16 min  PT Assessment / Plan / Recommendation  History of Present Illness patient who this am started to complain of shakeness, diaphoresis, decrease of loc, increase ot temperature. [facility]  [facility] he had a mri of the lumbar spine. Started on abs and [facility] He does not know why is here, has some mild headache and some pain in the left foot(hx of gout   PT Comments   Patient much more controlled with mobility this session. Limited by fatigue using RW with pressure through L arm. Does not want to attempt with HHA without RW. Continue to recommend SNF for ongoing Physical Therapy.     Follow Up Recommendations  SNF     Does the patient have the potential to tolerate intense rehabilitation     Barriers to Discharge        Equipment Recommendations  None recommended by PT    Recommendations for Other Services    Frequency Min 3X/week   Progress towards PT Goals Progress towards PT goals: Progressing toward goals  Plan Current plan remains appropriate    Precautions / Restrictions Precautions Precautions: Back;Fall Type of Shoulder Precautions: 2/17: Post Shoulder I+D without any restrictions post op per Leila Early, OTR/L speaking to [employer], PA-C (ortho) Precaution Comments: Verbalize and adheres to 3/3 back precautions Required Braces or Orthoses: Sling (for comfort) Restrictions Weight Bearing Restrictions: Yes LUE Weight Bearing: Weight bearing as tolerated Other Position/Activity Restrictions: None order, but discussed minimizing WBing on L shoulder.     Pertinent Vitals/Pain Complained of shoulder pain. patient repositioned for comfort     Mobility  Bed Mobility Rolling: Supervision Sidelying to sit: Supervision General bed mobility comments: s with  rails Transfers Overall transfer level: Needs assistance Equipment used: Rolling walker (2 wheeled) Sit to Stand: Min assist Stand pivot transfers: Min assist General transfer comment: Min A to power up into full standing position and to control descent onto recliner Ambulation/Gait Ambulation/Gait assistance: Min assist;+2 safety/equipment Ambulation Distance (Feet): 6 Feet Assistive device: Rolling walker (2 wheeled) Gait Pattern/deviations: Step-to pattern Gait velocity: slow General Gait Details: Patient able to take much better controlled steps this session. CUes for RW placement and postioning. A for steadiness and balance    Exercises     PT Diagnosis:    PT Problem List:   PT Treatment Interventions:     PT Goals (current goals can now be found in the care plan section)    Visit Information  Last PT Received On: 09/19/13 Assistance Needed: +2 (for safety) History of Present Illness: patient who this am started to complain of shakeness, diaphoresis, decrease of loc, increase ot temperature. [facility]  [facility] he had a mri of the lumbar spine. Started on abs and [facility]. He does not know why is here, has some mild headache and some pain in the left foot(hx of gout    Subjective Data      Cognition  Cognition Arousal/Alertness: Awake/alert Behavior During Therapy: WFL for tasks assessed/performed Overall Cognitive Status: Within Functional Limits for tasks assessed    Balance     End of Session PT - End of Session Equipment Utilized During Treatment: Gait belt Activity Tolerance: Patient tolerated treatment well;Patient limited by fatigue Patient left: in  chair;with call bell/phone within reach Nurse Communication: Mobility status   GP     Fredrich BirksRobinette, Julia Elizabeth 09/19/2013, 12:02 PM  09/19/2013 Fredrich Birksobinette, Julia Elizabeth PTA [phone] pager [phone] office

## 2013-09-19 NOTE — Progress Notes (Signed)
Thank you for consult on [name]. He's well known to CIR from prior stay for cauda equina syndrome with neurogenic B/B, recurrent gout flare as well as chronic DJD bilateral shoulder. Chart reviewed and note that patient was admitted with UTI as well as problems with in and out caths/urinary retention. He has had L-RTC repair with drainage of large hemorrhagic effusion on 09/13/13. He completes IV antibiotics today. Plans for d/c to SNF but family requesting CIR as back up to SNF.   Addendum:   Agree with the above. This patient is WELL KNOWN to me. I spoke with him at length today. There is not much "new" that we would really address on inpatient rehab at this time. His needs are more long term in nature. [facility] so that he can be closer to home. I think this is entirely appropriate.   I will continue to follow him on an outpatient basis. I would like to see him in about a month at my office. [phone])   Thanks Ranelle OysterZachary T. Cassian Torelli, MD, [facility] CenterLLCFAAPMR [facility]

## 2013-09-19 NOTE — Progress Notes (Signed)
Stable, no fever. ABS till today. To get case manager to help [facility] to [facility]

## 2013-09-19 NOTE — Social Work (Signed)
I have just received word from [facility] that they cannot accept patient due to mcare days being used and their concern that he will need more days than he has coverage/days-  I have advised patient's daughter via phone- she indicates he has not used any of his Medicare days and wants me to ask them to reconsider- [facility] and will await final word from them-  Jake LevyJanet Nykia [name], MSW, Theresia MajorsLCSWA [phone]

## 2013-09-20 ENCOUNTER — Encounter: Payer: Medicare Other | Admitting: Physical Medicine & Rehabilitation

## 2013-09-20 ENCOUNTER — Inpatient Hospital Stay
Admission: RE | Admit: 2013-09-20 | Discharge: 2013-11-19 | Disposition: A | Discharge status: Home / Self Care | Payer: No Typology Code available for payment source | Source: Ambulatory Visit | Attending: Pulmonary Disease | Admitting: Pulmonary Disease

## 2013-09-20 DIAGNOSIS — R279 Unspecified lack of coordination: Secondary | ICD-10-CM | POA: Diagnosis not present

## 2013-09-20 DIAGNOSIS — S46909A Unspecified injury of unspecified muscle, fascia and tendon at shoulder and upper arm level, unspecified arm, initial encounter: Secondary | ICD-10-CM | POA: Diagnosis not present

## 2013-09-20 DIAGNOSIS — S4980XA Other specified injuries of shoulder and upper arm, unspecified arm, initial encounter: Secondary | ICD-10-CM | POA: Diagnosis not present

## 2013-09-20 DIAGNOSIS — M5137 Other intervertebral disc degeneration, lumbosacral region: Secondary | ICD-10-CM | POA: Diagnosis not present

## 2013-09-20 DIAGNOSIS — G834 Cauda equina syndrome: Secondary | ICD-10-CM | POA: Diagnosis not present

## 2013-09-20 DIAGNOSIS — F329 Major depressive disorder, single episode, unspecified: Secondary | ICD-10-CM | POA: Diagnosis not present

## 2013-09-20 DIAGNOSIS — I2510 Atherosclerotic heart disease of native coronary artery without angina pectoris: Secondary | ICD-10-CM | POA: Diagnosis not present

## 2013-09-20 DIAGNOSIS — I25810 Atherosclerosis of coronary artery bypass graft(s) without angina pectoris: Secondary | ICD-10-CM | POA: Diagnosis not present

## 2013-09-20 DIAGNOSIS — N390 Urinary tract infection, site not specified: Secondary | ICD-10-CM | POA: Diagnosis not present

## 2013-09-20 DIAGNOSIS — I509 Heart failure, unspecified: Secondary | ICD-10-CM | POA: Diagnosis not present

## 2013-09-20 DIAGNOSIS — R262 Difficulty in walking, not elsewhere classified: Secondary | ICD-10-CM | POA: Diagnosis not present

## 2013-09-20 DIAGNOSIS — D509 Iron deficiency anemia, unspecified: Secondary | ICD-10-CM | POA: Diagnosis not present

## 2013-09-20 DIAGNOSIS — M6281 Muscle weakness (generalized): Secondary | ICD-10-CM | POA: Diagnosis not present

## 2013-09-20 DIAGNOSIS — I10 Essential (primary) hypertension: Secondary | ICD-10-CM | POA: Diagnosis not present

## 2013-09-20 DIAGNOSIS — F3289 Other specified depressive episodes: Secondary | ICD-10-CM | POA: Diagnosis not present

## 2013-09-20 NOTE — Progress Notes (Signed)
Patient ID: [name], male   DOB: 12/28/1940, 73 y.o.   MRN: [id number] [facility]. i gave a rx for COREG. ALSO WILL CALL id TO SEE IF WE CAN REMOVE picc line. To see me in 4 to 6 weeks

## 2013-09-20 NOTE — Social Work (Signed)
[facility]! Family is pleased and appreciative of CSW assistance in making this occur- Consulting civil engineerCharge RN is notifying MD for d/c summary and we are hopeful for d/c later today.  Reece LevyJanet Mima Cranmore, MSW, Theresia MajorsLCSWA [phone]

## 2013-09-20 NOTE — Progress Notes (Signed)
Report given to [facility], RN.

## 2013-09-20 NOTE — Social Work (Signed)
Clinical Social Work Department CLINICAL SOCIAL WORK PLACEMENT NOTE 09/20/2013  Patient:  Jake MarvelHAMILTON,Jake W  Account Number:  [id number] Admit date:  09/05/2013  Clinical Social Worker:  Read DriversEGINA INGLE, LCSWA  Date/time:  09/16/2013 02:38 PM  Clinical Social Work is seeking post-discharge placement for this patient at the following level of care:   SKILLED NURSING   (*CSW will update this form in Epic as items are completed)   09/16/2013  Patient/family provided with [facility] Department of Clinical Social Work's list of facilities offering this level of care within the geographic area requested by the patient (or if unable, by the patient's family).  09/16/2013  Patient/family informed of their freedom to choose among providers that offer the needed level of care, that participate in Medicare, Medicaid or managed care program needed by the patient, have an available bed and are willing to accept the patient.  09/16/2013  Patient/family informed of [facility]' ownership interest in [facility], as well as of the fact that they are under no obligation to receive care at this facility.  PASARR submitted to EDS on 09/16/2013 PASARR number received from [employer] on 09/16/2013  FL2 transmitted to all facilities in geographic area requested by pt/family on  09/16/2013 FL2 transmitted to all facilities within larger geographic area on 09/16/2013  Patient informed that his/her managed care company has contracts with or will negotiate with  certain facilities, including the following:     Patient/family informed of bed offers received:  09/20/2013 [facility] Physician recommends and patient chooses bed at    [facility] on  09/20/2013 Patient to be transferred to facility by [facility]  The following physician request were entered in Epic:   Additional Comments: Reece LevyJanet Rodriques Badie, MSW, Theresia MajorsLCSWA [phone]

## 2013-09-20 NOTE — Social Work (Signed)
Patient for d/c today to SNF bed [facility]- family and patient pleased and agreeable to this plan- plan transfer via family car. Reece LevyJanet Temperance Kelemen, MSW, Theresia MajorsLCSWA [phone]

## 2013-09-20 NOTE — Discharge Summary (Signed)
Physician Discharge Summary  Patient ID: [name] MRN: [id number] DOB/AGE: 1940/10/26 73 y.o.  Admit date: 09/05/2013 Discharge date: 09/20/2013  Admission Diagnoses:fever left rotator cuff injury  Discharge Diagnoses:  Principal Problem:   Cauda equina syndrome with neurogenic bladder Active Problems:   HYPERTENSION   CAD in native artery   Lumbar degenerative disc disease   Altered mental status   Fever and chills   Dyspnea   UTI (urinary tract infection)   Acute CHF (congestive heart failure)   Elevated troponin   Anemia, iron deficiency   Shoulder pain, left   Discharged Condition: [facility] Course:treatment for UTI. Surgery for left rotator cuff injury  Consults: ORTHO, ID, GU.   Significant Diagnostic Studies: MRI LEFT SHOLULDER  Treatments: LEFT SHOULDER SURGERY. uti CONTROL  Discharge Exam: Blood pressure 136/69, pulse 54, temperature 97.2 F (36.2 C), temperature source Oral, resp. rate 18, height 6' (1.829 m), weight 90 kg (198 lb 6.6 oz), SpO2 98.00%. NO pain in shoulder, normal temperature. Need self cath to empty bladder  Disposition: to [facility]   Future Appointments Provider Department Dept Phone   11/03/2013 8:30 AM Vickki Hearing, MD [facility] [phone]       Medication List    ASK your doctor about these medications       acetaminophen 325 MG tablet  Commonly known as:  TYLENOL  Take 1-2 tablets (325-650 mg total) by mouth every 4 (four) hours as needed for mild pain.     atorvastatin 40 MG tablet  Commonly known as:  LIPITOR  Take 1 tablet (40 mg total) by mouth daily.     camphor-menthol lotion  Commonly known as:  SARNA  Apply topically 3 (three) times daily after meals.     citalopram 40 MG tablet  Commonly known as:  CELEXA  Take 40 mg by mouth daily.     colchicine 0.6 MG tablet  Take 1 tablet (0.6 mg total) by mouth 2 (two) times daily.     doxazosin 4 MG tablet  Commonly  known as:  CARDURA  Take 8 mg by mouth at bedtime.     gabapentin 300 MG capsule  Commonly known as:  NEURONTIN  Take 2 capsules (600 mg total) by mouth 3 (three) times daily.     methocarbamol 500 MG tablet  Commonly known as:  ROBAXIN  Take 1 tablet (500 mg total) by mouth 4 (four) times daily as needed for muscle spasms.     nitroGLYCERIN 0.4 MG SL tablet  Commonly known as:  NITROSTAT  Place 0.4 mg under the tongue every 5 (five) minutes as needed. For chest pain     OxyCODONE 20 mg T12a 12 hr tablet  Commonly known as:  OXYCONTIN  Take 1 tablet (20 mg total) by mouth every 12 (twelve) hours.     predniSONE 5 MG tablet  Commonly known as:  DELTASONE  Take 5 mg by mouth daily with breakfast.     sulfamethoxazole-trimethoprim 800-160 MG per tablet  Commonly known as:  BACTRIM DS  Take 1 tablet by mouth every 12 (twelve) hours.     tamsulosin 0.4 MG Caps capsule  Commonly known as:  FLOMAX  Take 1 capsule (0.4 mg total) by mouth daily after breakfast.     VITAMIN B 12 PO  Take 1 tablet by mouth daily.     vitamin C 500 MG tablet  Commonly known as:  ASCORBIC ACID  Take 500 mg  by mouth daily.     VITAMIN D (CHOLECALCIFEROL) PO  Take 2-3 tablets by mouth daily.      To see me in 4 to 6 weeks or before prn continue with self cath to empty bladder and laxatives/suppositories prn    Signed: Karn CassisBOTERO,Selena Swaminathan M 09/20/2013, 11:45 AM

## 2013-09-25 DIAGNOSIS — G834 Cauda equina syndrome: Secondary | ICD-10-CM | POA: Diagnosis not present

## 2013-09-26 NOTE — H&P (Signed)
[name] MRN: [id number] DOB/AGE: 09/25/1940 73 y.o. Primary Care Physician:Nickson Middlesworth L, MD Admit date: 09/20/2013 Chief Complaint: Weakness/cauda equina syndrome HPI: I saw him yesterday at the nursing home. He has been in and out of the hospital with problems with his back. He had surgery on his back that apparently had an infection in that area and developed cauda equina syndrome. He has been treated he's had recurrent surgery to try to clear out the epidural abscess. He has multiple other medical problems as listed below. He says he still having to self catheterize but he has more sensation is going to have bowel movement or urinary movement. He still has weakness of his legs. He has no other new complaints. He says he needs something for pain in between his OxyContin.  Past Medical History  Diagnosis Date  . Hyperlipidemia   . Small bowel problem     HAD ALOT OF SCAR TISSUE FROM PREVIOUS SURGERIES..NG WAS INSERTED ...Marland KitchenMarland KitchenNO SURGERY NEEDED...Marland KitchenMarland KitchenIN FOR 8 DAYS  . Disorder of blood     BEEN TREATED BY DERMATOLOGIST X 4 YRS..."BLOOD BLISTERS"  . Arthritis   . Gout   . Anxiety   . Hx MRSA infection     rt shoulder  . Pneumonia     "I've had it 3-4 times"  . Coronary artery disease     IN 2000   STENT PLACED IN 2012 sees Dr. Dietrich Pates, saw last 2013  . HTN (hypertension)     sees [name] in [place]  . Small bowel obstruction   . Ventral hernia   . Acute renal failure 04/17/2013  . 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 Anemia, iron deficiency 09/09/2013   Past Surgical History  Procedure Laterality Date  . Sternal surg  2000    HAS HAD 5-6 ON HIS STERNUM; "caught MRSA in it"  . Lumbar laminectomy/decompression microdiscectomy  06/13/2011    Procedure: LUMBAR LAMINECTOMY/DECOMPRESSION MICRODISCECTOMY;  Surgeon: Karn Cassis;  Location: [facility];  Service: Neurosurgery;  Laterality: N/A;  Lumbar three, lumbar four-five Laminectomy  . Eye surgery       bilateral cataract  . Anterior cervical corpectomy  12/17/11  . Incision and drainage of wound  ~ 20ll; 12/03/11    "had infection in my right"  . Shoulder open rotator cuff repair  ~ 2011    right  . Peripherally inserted central catheter insertion  2011 & 11/2011  . Cataract extraction w/ intraocular lens  implant, bilateral  ? 2011  . Coronary artery bypass graft  2000    CABG X5  . Back surgery      lumbar  . Tonsillectomy  1949  . Cholecystectomy  2006 "or after"  . Coronary angioplasty with stent placement  2012  . Anterior cervical corpectomy  12/17/2011    Procedure: ANTERIOR CERVICAL CORPECTOMY;  Surgeon: Karn Cassis, MD;  Location: [facility];  Service: Neurosurgery;  Laterality: N/A;  Anterior Cervical Decompression Fusion Five to Thoracic Two with plating  . Lumbar laminectomy/decompression microdiscectomy Right 06/17/2013    Procedure: LUMBAR LAMINECTOMY/DECOMPRESSION MICRODISCECTOMY 1 LEVEL;  Surgeon: Karn Cassis, MD;  Location: [facility];  Service: Neurosurgery;  Laterality: Right;  Right L3-4 Microdiskectomy  . Lumbar wound debridement N/A 08/02/2013    Procedure: INCISION AND DRAINAGE OF LUMBAR WOUND DEBRIDEMENT;  Surgeon: Karn Cassis, MD;  Location: [facility];  Service: Neurosurgery;  Laterality: N/A;  . Shoulder arthroscopy Left 09/13/2013  Procedure: ARTHROSCOPIC IRRIGATION AND DEBRIDEMENT, SYNOVECTOMY ,  LEFT SHOULDER ;  Surgeon: Thera Flake., MD;  Location: [facility];  Service: Orthopedics;  Laterality: Left;        Family History  Problem Relation Age of Onset  . Heart disease    . Hypertension Mother   . Hypertension Maternal Aunt   . Hypertension Maternal Uncle   . Anesthesia problems Neg Hx   . Hypotension Neg Hx   . Malignant hyperthermia Neg Hx   . Pseudochol deficiency Neg Hx     Social History:  reports that he quit smoking about 14 years ago. His smoking use included Cigarettes. He has a 1 pack-year smoking history. He has  never used smokeless tobacco. He reports that he does not drink alcohol or use illicit drugs.   Allergies:  Allergies  Allergen Reactions  . Morphine Other (See Comments)    REACTION: made him go crazy; "I had fun w/it; my family didn't think it was too funny"  . Metoprolol Other (See Comments)    REACTION:  Tachycardia; "don't remember how bad it was; it was so long ago"  . Rifampin     Itch     Medications Prior to Admission  Medication Sig Dispense Refill  . acetaminophen (TYLENOL) 325 MG tablet Take 1-2 tablets (325-650 mg total) by mouth every 4 (four) hours as needed for mild pain.      Marland Kitchen atorvastatin (LIPITOR) 40 MG tablet Take 1 tablet (40 mg total) by mouth daily.  90 tablet  2  . camphor-menthol (SARNA) lotion Apply topically 3 (three) times daily after meals.  222 mL  1  . citalopram (CELEXA) 40 MG tablet Take 40 mg by mouth daily.      . colchicine 0.6 MG tablet Take 1 tablet (0.6 mg total) by mouth 2 (two) times daily.  60 tablet  1  . Cyanocobalamin (VITAMIN B 12 PO) Take 1 tablet by mouth daily.      Marland Kitchen doxazosin (CARDURA) 4 MG tablet Take 8 mg by mouth at bedtime.      . gabapentin (NEURONTIN) 300 MG capsule Take 2 capsules (600 mg total) by mouth 3 (three) times daily.  180 capsule  1  . methocarbamol (ROBAXIN) 500 MG tablet Take 1 tablet (500 mg total) by mouth 4 (four) times daily as needed for muscle spasms.  75 tablet  1  . nitroGLYCERIN (NITROSTAT) 0.4 MG SL tablet Place 0.4 mg under the tongue every 5 (five) minutes as needed. For chest pain      . OxyCODONE (OXYCONTIN) 20 mg T12A 12 hr tablet Take 1 tablet (20 mg total) by mouth every 12 (twelve) hours.  60 tablet  0  . predniSONE (DELTASONE) 5 MG tablet Take 5 mg by mouth daily with breakfast.      . sulfamethoxazole-trimethoprim (BACTRIM DS) 800-160 MG per tablet Take 1 tablet by mouth every 12 (twelve) hours.  60 tablet  1  . tamsulosin (FLOMAX) 0.4 MG CAPS capsule Take 1 capsule (0.4 mg total) by mouth daily  after breakfast.  30 capsule  1  . vitamin C (ASCORBIC ACID) 500 MG tablet Take 500 mg by mouth daily.      Marland Kitchen VITAMIN D, CHOLECALCIFEROL, PO Take 2-3 tablets by mouth daily.           XBJ:YNWGN from the symptoms mentioned above,there are no other symptoms referable to all systems reviewed.  Physical Exam: There were no vitals taken for this visit.  He is awake and alert. He is using a walker. He does not appear to be in any acute distress. His chest is clear. His heart is regular. He has previous midline sternotomy scar and scarring from mediastinal exploration. His pupils are reactive nose and throat clear mucous membranes are moist his abdomen is soft. His legs are weak. Extremities showed no edema. Central nervous system exam shows he is very weak legs.   No results found for this basename: WBC, NEUTROABS, HGB, HCT, MCV, PLT,  in the last 72 hours No results found for this basename: NA, K, CL, CO2, GLUCOSE, BUN, CREATININE, CALCIUM, MG, PHO,  in the last 72 hourslablast2(ast:2,ALT:2,alkphos:2,bilitot:2,prot:2,albumin:2)@    No results found for this or any previous visit (from the past 240 hour(s)).   Dg Chest 1 View  09/07/2013   CLINICAL DATA:  Increasing shortness of breath, being treated for infection  EXAM: CHEST - 1 VIEW  COMPARISON:  09/05/2013  FINDINGS: Status post CABG with mild stable cardiac enlargement. Moderate to severe vascular congestion. Decreased interstitial prominence. Small bilateral pleural effusions.  IMPRESSION: Stable vascular congestion with improving pulmonary edema.   Electronically Signed   By: Esperanza Heir M.D.   On: 09/07/2013 14:03   Dg Chest 2 View  08/30/2013   CLINICAL DATA:  Bilateral lower extremity edema.  EXAM: CHEST  2 VIEW  COMPARISON:  Single view of the chest 08/04/2013. PA and lateral chest 06/17/2013 and 12/12/2011.  FINDINGS: Heart size is upper normal. There is no pulmonary edema, consolidative process or pneumothorax. Chronic blunting of  right costophrenic angle is compatible with scar. Calcified granulomata on the right are again seen.  IMPRESSION: No acute disease.  Stable compared to prior exams.   Electronically Signed   By: Drusilla Kanner M.D.   On: 08/30/2013 22:00   Mr Lumbar Spine W Wo Contrast  09/05/2013   CLINICAL DATA:  Fever and chills.  Recent epidural abscess.  EXAM: MRI LUMBAR SPINE WITHOUT AND WITH CONTRAST  TECHNIQUE: Multiplanar and multiecho pulse sequences of the lumbar spine were obtained without and with intravenous contrast.  CONTRAST:  9mL MULTIHANCE GADOBENATE DIMEGLUMINE 529 MG/ML IV SOLN  COMPARISON:  Radiographs dated 08/15/2013 and lumbar MRI dated 07/29/2013 and 06/07/2013  FINDINGS: There is a tiny residual amount of fluid adjacent to the anterior medial aspect of the right facet joint at L3-4 seen on images 23 and 24 series 5. This fluid in appearance to extend into the facet joint at the level of the previous more prominent epidural abscess.  There are connecting midline fluid collections along the incision which could represent seroma but given the patient's history, this could represent pus in the posterior soft tissues. This is best seen on images 7 and 8 of series 4.  There are postsurgical changes at the disc spaces at L4-5 and L5-S1 without evidence of osteomyelitis or discitis. There is a tiny amount of fluid in the L5-S1 disc space. I doubt that this represents infection.  There is small broad-based soft disc protrusion at L3-4 which compresses the the thecal sac relatively severely. This is best seen on image 24 of series 5.  T12-L1 and L1-2:  Normal.  L2-3: Posterior decompression. Tiny broad-based disc bulge with no neural impingement. Tiny amount of fluid adjacent to the inferior aspect of the left facet joint at L2-3 which could represent pus and probably communicates with the other fluid collections. This is seen on image 11 of series 4 and image 19 of series  5.  IMPRESSION: 1. Tiny amount of  residual fluid in the right posterior lateral aspect of the epidural space at L3-4. The majority of epidural fluid seen on the prior exam has been evacuated. 2. Multiple enhancing fluid collections in the posterior paraspinal soft tissues which could represent abscesses, as described. 3. Compression of the thecal sac at L3-4 due to a broad-based disc protrusion.   Electronically Signed   By: Geanie CooleyJim  Maxwell M.D.   On: 09/05/2013 15:14   Mr Shoulder Left Wo Contrast  09/13/2013   CLINICAL DATA:  Severe left shoulder pain. History of rotator cuff tear. Evaluate for possible infection.  EXAM: MRI OF THE LEFT SHOULDER WITHOUT CONTRAST  TECHNIQUE: Multiplanar, multisequence MR imaging of the shoulder was performed. No intravenous contrast was administered.  COMPARISON:  Radiographs 09/12/2013.  FINDINGS: Rotator cuff: There is a chronic appearing full-thickness rotator cuff tear associated with a high riding humeral head resulting in obliteration of the subacromial space appear There is associated retraction of the supraspinatus and infraspinous tendons. The subscapularis tendon also appears torn and partially retracted.  Muscles: There is diffuse atrophy of the rotator cuff musculature. There is some edema within the supraspinatus muscle. There is also extensive heterogeneous signal within the overlying deltoid muscle, especially anteriorly where there are foci of the low T1 and T2 signal. There is no corresponding calcification on the radiographs, and this may reflect hemorrhage. There is adjacent subcutaneous edema without focal extra-articular fluid collection.  Biceps long head: The intra-articular portion is poorly visualized and likely ruptured.  Acromioclavicular Joint: The acromion is type 2. There are moderate acromioclavicular degenerative changes. As above, the subacromial space is obliterated by the high riding humeral head. There is complex fluid within the subacromial -subdeltoid and the subcoracoid bursa.   Glenohumeral Joint: Moderate size complex joint effusion, likely in part due to synovitis. There are diffuse glenohumeral degenerative changes with osteophytes and chondral thinning.  Labrum: Diffuse degenerative tearing of the labrum, especially superiorly and posteriorly.  Bones: There is degenerative subchondral edema and cyst formation posteriorly in the glenoid. No bone destruction is demonstrated to suggest osteomyelitis. There is no evidence of acute fracture.  IMPRESSION: 1. Large chronic appearing rotator cuff tear as described with associated tendon retraction and muscular atrophy. 2. Extensive edema and possible hemorrhage anteriorly within the deltoid muscle. There is adjacent subcutaneous edema as well as associated bursal fluid and a moderate-sized joint effusion. Soft tissue infection cannot be excluded. 3. Glenohumeral degenerative changes with diffuse labral tearing and probable rupture of the intra-articular portion of the biceps tendon. 4. No evidence of osteomyelitis.   Electronically Signed   By: Roxy HorsemanBill  Veazey M.D.   On: 09/13/2013 08:29   Dg Chest Port 1 View  09/05/2013   CLINICAL DATA:  Fever.  Short of breath.  EXAM: PORTABLE CHEST - 1 VIEW  COMPARISON:  08/30/2013.  FINDINGS: Mild enlargement of the cardiopericardial silhouette. Lower cervical ACDF. CABG. Calcified granuloma in the right lung. There is diffuse interstitial prominence, most compatible with interstitial pulmonary edema. Chronic blunting of the right costophrenic angle. There is no focal consolidation identified.  IMPRESSION: Cardiomegaly and interstitial pulmonary edema compatible with mild CHF.   Electronically Signed   By: Andreas NewportGeoffrey  Lamke M.D.   On: 09/05/2013 21:16   Dg Shoulder Left  09/12/2013   CLINICAL DATA:  Left shoulder pain for 2 days, no acute injury  EXAM: LEFT SHOULDER - 2+ VIEW  COMPARISON:  Left shoulder films of 11/06/2010  FINDINGS: There  is considerable spurring from the acromion resulting in  impingement possible chronic rotator cuff disease. Degenerative spurring also is noted involving the left [facility] joint which may impinge as well. No acute bony abnormality is seen. The left shoulder joint space is only minimally narrowed.  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   Electronically Signed   By: Dwyane Dee M.D.   On: 09/12/2013 15:18   Impression: He is recovering from cauda equina syndrome which is related to previous back surgery and infection. He has multiple other medical problems including coronary artery occlusive disease, COPD, previous MRSA infections previous episodes of small bowel obstruction and severe orthopedic abnormalities Active Problems:   * No active hospital problems. *     Plan: He's here for rehabilitation continue with PT et Karie Soda.      Kellsey Sansone L   09/26/2013, 8:02 AM

## 2013-09-28 LAB — BODY FLUID CULTURE
Culture: NO GROWTH
Gram Stain: NONE SEEN

## 2013-09-28 LAB — ANAEROBIC CULTURE: Gram Stain: NONE SEEN

## 2013-10-05 ENCOUNTER — Encounter: Payer: Self-pay | Admitting: *Deleted

## 2013-10-11 ENCOUNTER — Encounter: Payer: Self-pay | Admitting: Adult Health

## 2013-10-11 ENCOUNTER — Ambulatory Visit (INDEPENDENT_AMBULATORY_CARE_PROVIDER_SITE_OTHER): Payer: Medicare Other | Admitting: Adult Health

## 2013-10-11 VITALS — BP 145/54 | HR 69 | Ht 72.0 in | Wt 180.0 lb

## 2013-10-11 DIAGNOSIS — I2510 Atherosclerotic heart disease of native coronary artery without angina pectoris: Secondary | ICD-10-CM | POA: Diagnosis not present

## 2013-10-11 DIAGNOSIS — I25810 Atherosclerosis of coronary artery bypass graft(s) without angina pectoris: Secondary | ICD-10-CM | POA: Diagnosis not present

## 2013-10-11 DIAGNOSIS — I10 Essential (primary) hypertension: Secondary | ICD-10-CM | POA: Diagnosis not present

## 2013-10-11 DIAGNOSIS — D509 Iron deficiency anemia, unspecified: Secondary | ICD-10-CM | POA: Diagnosis not present

## 2013-10-11 NOTE — Assessment & Plan Note (Signed)
Followed by PCP labs are due.

## 2013-10-11 NOTE — Assessment & Plan Note (Signed)
He is no longer on DAPT with several surgeries and post-operative wound infections with debridements. He is without chest pain. He will be continued on statin and ASA. Will follow up in 6 months unless symptomatic.

## 2013-10-11 NOTE — Assessment & Plan Note (Addendum)
Overall BP is well controlled. He is medically compliant as a resident of the [facility]. He denies headache or dizziness.Continue on current medication regimen.

## 2013-10-11 NOTE — Progress Notes (Deleted)
Name: [name]    DOB: 15-Jan-1941  Age: 73 y.o.  MR#: [id number]       PCP:  Fredirick Maudlin, MD      Insurance: Payor: MEDICARE / Plan: MEDICARE PART A AND B / Product Type: *No Product type* /   CC:    Chief Complaint  Patient presents with  . Coronary Artery Disease  . Hypertension    VS Filed Vitals:   10/11/13 1521  BP: 145/54  Pulse: 69  Height: 6' (1.829 m)  Weight: 180 lb (81.647 kg)  SpO2: 97%    Weights Current Weight  10/11/13 180 lb (81.647 kg)  09/05/13 198 lb 6.6 oz (90 kg)  09/05/13 198 lb 6.6 oz (90 kg)    Blood Pressure  BP Readings from Last 3 Encounters:  10/11/13 145/54  09/20/13 136/69  09/20/13 136/69     Admit date:  (Not on file) Last encounter with RMR:  04/27/2013   Allergy Morphine; Metoprolol; and Rifampin  No current outpatient prescriptions on file.   No current facility-administered medications for this visit.    Discontinued Meds:   There are no discontinued medications.  Patient Active Problem List   Diagnosis Date Noted  . Shoulder pain, left 09/12/2013  . Anemia, iron deficiency 09/09/2013  . Dyspnea 09/07/2013  . UTI (urinary tract infection) 09/07/2013  . Acute CHF (congestive heart failure) 09/07/2013  . Elevated troponin 09/07/2013  . Fever and chills 09/06/2013  . Altered mental status 09/05/2013  . Neurogenic bowel 08/22/2013  . Cauda equina syndrome with neurogenic bladder 08/22/2013  . Gout flare 08/22/2013  . Postoperative wound infection 08/22/2013  . Lumbar degenerative disc disease 08/02/2013  . Post-operative pain 07/29/2013  . Osteonecrosis 05/05/2013  . Small bowel obstruction 04/17/2013  . Acute renal failure 04/17/2013  . Medial meniscus, posterior horn derangement 02/17/2013  . Knee sprain and strain 02/17/2013  . Trigger point with neck pain 03/18/2012  . Rotator cuff tear arthropathy of right shoulder 09/08/2011  . CAD in native artery 01/09/2011  . Ankle fracture 12/25/2010  .  Contusion of shoulder, left 12/25/2010  . PEMPHIGUS VULGARIS 07/02/2010  . MRSA 05/13/2010  . PRURITUS 05/13/2010  . SPINAL STENOSIS, LUMBAR 05/13/2010  . HYPERLIPIDEMIA 04/24/2010  . GASTROESOPHAGEAL REFLUX DISEASE 04/24/2010  . VENTRAL HERNIA, INCISIONAL 04/24/2010  . DEGENERATIVE JOINT DISEASE 04/24/2010  . PYOGENIC ARTHRITIS, SHOULDER REGION 02/04/2010  . HYPERTENSION 01/01/2010  . RUPTURE ROTATOR CUFF 02/19/2009    LABS    Component Value Date/Time   NA 139 09/16/2013 0332   NA 138 09/14/2013 0400   NA 134* 09/13/2013 0521   K 4.9 09/16/2013 0332   K 4.3 09/14/2013 0400   K 5.0 09/13/2013 0521   CL 98 09/16/2013 0332   CL 98 09/14/2013 0400   CL 95* 09/13/2013 0521   CO2 29 09/16/2013 0332   CO2 28 09/14/2013 0400   CO2 27 09/13/2013 0521   GLUCOSE 114* 09/16/2013 0332   GLUCOSE 96 09/14/2013 0400   GLUCOSE 99 09/13/2013 0521   BUN 28* 09/16/2013 0332   BUN 23 09/14/2013 0400   BUN 24* 09/13/2013 0521   CREATININE 1.14 09/16/2013 0332   CREATININE 1.39* 09/14/2013 0400   CREATININE 1.30 09/13/2013 0521   CALCIUM 9.4 09/16/2013 0332   CALCIUM 9.0 09/14/2013 0400   CALCIUM 8.9 09/13/2013 0521   GFRNONAA 63* 09/16/2013 0332   GFRNONAA 50* 09/14/2013 0400   GFRNONAA 54* 09/13/2013 0521   GFRAA 73* 09/16/2013 7829  GFRAA 58* 09/14/2013 0400   GFRAA 63* 09/13/2013 0521   CMP     Component Value Date/Time   NA 139 09/16/2013 0332   K 4.9 09/16/2013 0332   CL 98 09/16/2013 0332   CO2 29 09/16/2013 0332   GLUCOSE 114* 09/16/2013 0332   BUN 28* 09/16/2013 0332   CREATININE 1.14 09/16/2013 0332   CALCIUM 9.4 09/16/2013 0332   PROT 5.4* 09/05/2013 1252   ALBUMIN 2.9* 09/05/2013 1252   AST 17 09/05/2013 1252   ALT 11 09/05/2013 1252   ALKPHOS 57 09/05/2013 1252   BILITOT 0.3 09/05/2013 1252   GFRNONAA 63* 09/16/2013 0332   GFRAA 73* 09/16/2013 0332       Component Value Date/Time   WBC 9.1 09/18/2013 0632   WBC 7.9 09/15/2013 0450   WBC 6.7 09/14/2013 0400   HGB 9.1* 09/18/2013 0632   HGB 8.5* 09/15/2013  0450   HGB 9.2* 09/14/2013 0400   HCT 28.5* 09/18/2013 0632   HCT 26.0* 09/15/2013 0450   HCT 27.2* 09/14/2013 0400   MCV 88.2 09/18/2013 0632   MCV 87.2 09/15/2013 0450   MCV 87.2 09/14/2013 0400    Lipid Panel     Component Value Date/Time   CHOL 135 10/19/2012 0745   TRIG 75 10/19/2012 0745   HDL 45 10/19/2012 0745   CHOLHDL 3.0 10/19/2012 0745   VLDL 15 10/19/2012 0745   LDLCALC 75 10/19/2012 0745    ABG    Component Value Date/Time   HCO3 25.6* 06/13/2011 1041   TCO2 26 08/26/2012 1433   O2SAT 94.0 06/13/2011 1041     Lab Results  Component Value Date   TSH 2.362 09/07/2013   BNP (last 3 results)  Recent Labs  09/08/13 0918 09/10/13 0435 09/11/13 0430  PROBNP 33476.0* 11292.0* 3379.0*   Cardiac Panel (last 3 results) No results found for this basename: CKTOTAL, CKMB, TROPONINI, RELINDX,  in the last 72 hours  Iron/TIBC/Ferritin    Component Value Date/Time   IRON 15* 09/08/2013 0837   TIBC 202* 09/08/2013 0837   FERRITIN 11* 09/08/2013 0837     EKG Orders placed during the hospital encounter of 09/05/13  . EKG 12-LEAD  . EKG 12-LEAD  . EKG  . EKG 12-LEAD  . EKG 12-LEAD     Prior Assessment and Plan Problem List as of 10/11/2013     Cardiovascular and Mediastinum   CAD in native artery   Last Assessment & Plan   04/27/2013 Office Visit Written 04/27/2013  3:14 PM by Jodelle Gross, NP     He is without chest pain or DOE. He is medically compliant with the exception of lipitor. I will give him new Rx. He will have follow up labs in 6 months with new appt.    HYPERTENSION   Last Assessment & Plan   04/27/2013 Office Visit Written 04/27/2013  3:14 PM by Jodelle Gross, NP     Good control of BP.  No additions or changes at this time    Acute CHF (congestive heart failure)     Digestive   GASTROESOPHAGEAL REFLUX DISEASE   Small bowel obstruction   Neurogenic bowel     Nervous and Auditory   Cauda equina syndrome with neurogenic bladder      Musculoskeletal and Integument   PEMPHIGUS VULGARIS   PRURITUS   PYOGENIC ARTHRITIS, SHOULDER REGION   DEGENERATIVE JOINT DISEASE   RUPTURE ROTATOR CUFF   Ankle fracture   Rotator cuff tear arthropathy  of right shoulder   Medial meniscus, posterior horn derangement   Knee sprain and strain   Osteonecrosis   Lumbar degenerative disc disease     Genitourinary   Acute renal failure   UTI (urinary tract infection)     Other   Anemia, iron deficiency   MRSA   HYPERLIPIDEMIA   Last Assessment & Plan   10/05/2012 Office Visit Written 10/05/2012  3:09 PM by Jodelle Gross, NP     It is time to reassess his lipid status. We will plan on followup fasting lipids and LFTs can be done during the day he has not stress test.    VENTRAL HERNIA, INCISIONAL   SPINAL STENOSIS, LUMBAR   Contusion of shoulder, left   Trigger point with neck pain   Post-operative pain   Gout flare   Postoperative wound infection   Altered mental status   Fever and chills   Dyspnea   Elevated troponin   Shoulder pain, left       Imaging: Mr Shoulder Left Wo Contrast  09/13/2013   CLINICAL DATA:  Severe left shoulder pain. History of rotator cuff tear. Evaluate for possible infection.  EXAM: MRI OF THE LEFT SHOULDER WITHOUT CONTRAST  TECHNIQUE: Multiplanar, multisequence MR imaging of the shoulder was performed. No intravenous contrast was administered.  COMPARISON:  Radiographs 09/12/2013.  FINDINGS: Rotator cuff: There is a chronic appearing full-thickness rotator cuff tear associated with a high riding humeral head resulting in obliteration of the subacromial space appear There is associated retraction of the supraspinatus and infraspinous tendons. The subscapularis tendon also appears torn and partially retracted.  Muscles: There is diffuse atrophy of the rotator cuff musculature. There is some edema within the supraspinatus muscle. There is also extensive heterogeneous signal within the overlying deltoid  muscle, especially anteriorly where there are foci of the low T1 and T2 signal. There is no corresponding calcification on the radiographs, and this may reflect hemorrhage. There is adjacent subcutaneous edema without focal extra-articular fluid collection.  Biceps long head: The intra-articular portion is poorly visualized and likely ruptured.  Acromioclavicular Joint: The acromion is type 2. There are moderate acromioclavicular degenerative changes. As above, the subacromial space is obliterated by the high riding humeral head. There is complex fluid within the subacromial -subdeltoid and the subcoracoid bursa.  Glenohumeral Joint: Moderate size complex joint effusion, likely in part due to synovitis. There are diffuse glenohumeral degenerative changes with osteophytes and chondral thinning.  Labrum: Diffuse degenerative tearing of the labrum, especially superiorly and posteriorly.  Bones: There is degenerative subchondral edema and cyst formation posteriorly in the glenoid. No bone destruction is demonstrated to suggest osteomyelitis. There is no evidence of acute fracture.  IMPRESSION: 1. Large chronic appearing rotator cuff tear as described with associated tendon retraction and muscular atrophy. 2. Extensive edema and possible hemorrhage anteriorly within the deltoid muscle. There is adjacent subcutaneous edema as well as associated bursal fluid and a moderate-sized joint effusion. Soft tissue infection cannot be excluded. 3. Glenohumeral degenerative changes with diffuse labral tearing and probable rupture of the intra-articular portion of the biceps tendon. 4. No evidence of osteomyelitis.   Electronically Signed   By: Roxy Horseman M.D.   On: 09/13/2013 08:29   Dg Shoulder Left  09/12/2013   CLINICAL DATA:  Left shoulder pain for 2 days, no acute injury  EXAM: LEFT SHOULDER - 2+ VIEW  COMPARISON:  Left shoulder films of 11/06/2010  FINDINGS: There is considerable spurring from the acromion resulting  in  impingement possible chronic rotator cuff disease. Degenerative spurring also is noted involving the left [facility] which may impinge as well. No acute bony abnormality is seen. The left shoulder joint space is only minimally narrowed.  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   Electronically Signed   By: Dwyane DeePaul  Barry M.D.   On: 09/12/2013 15:18

## 2013-10-11 NOTE — Progress Notes (Signed)
HPI: [name] is a 73 year old patient to be est. with Dr. Beulah Gandy, we are following for ongoing assessment and management of CAD, status post CABG, with most recent cardiac catheterization 2014 requiring PTCA of the native circumflex with bare-metal stent.  He was continued on dual antiplatelet therapy.    The patient was admitted to [facility] in the setting of Cauda Equina Syndrome with neurogenic bladder.He self catheterizes. Other history during that admission was hypertension. The patient is a resident of a skilled nursing facility, [facility]   He comes today with continued frailty,but is working with PT for strengthening. He is doing well. He denies exertional chest pain, DOE. He is now off of DAPT after treatment for SBO and rotator cuff surgery with wound infection and debridement., and had not been restarted. He has iron deficiency edema.    He is without cardiac complaints today. Is anxious to become more mobile.      Allergies  Allergen Reactions  . Morphine Other (See Comments)    REACTION: made him go crazy; "I had fun w/it; my family didn't think it was too funny"  . Metoprolol Other (See Comments)    REACTION:  Tachycardia; "don't remember how bad it was; it was so long ago"  . Rifampin     Itch     No current outpatient prescriptions on file.   No current facility-administered medications for this visit.    Past Medical History  Diagnosis Date  . Hyperlipidemia   . Small bowel problem     HAD ALOT OF SCAR TISSUE FROM PREVIOUS SURGERIES..NG WAS INSERTED ...Marland KitchenMarland KitchenNO SURGERY NEEDED...Marland KitchenMarland KitchenIN FOR 8 DAYS  . Disorder of blood     BEEN TREATED BY DERMATOLOGIST X 4 YRS..."BLOOD BLISTERS"  . Arthritis   . Gout   . Anxiety   . Hx MRSA infection     rt shoulder  . Pneumonia     "I've had it 3-4 times"  . Coronary artery disease     IN 2000   STENT PLACED IN 2012 sees Dr. Dietrich Pates, saw last 2013  . HTN (hypertension)     sees [name] in [place]  . Small  bowel obstruction   . Ventral hernia   . Acute renal failure 04/17/2013  . 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 Anemia, iron deficiency 09/09/2013    Past Surgical History  Procedure Laterality Date  . Sternal surg  2000    HAS HAD 5-6 ON HIS STERNUM; "caught MRSA in it"  . Lumbar laminectomy/decompression microdiscectomy  06/13/2011    Procedure: LUMBAR LAMINECTOMY/DECOMPRESSION MICRODISCECTOMY;  Surgeon: Karn Cassis;  Location: [facility];  Service: Neurosurgery;  Laterality: N/A;  Lumbar three, lumbar four-five Laminectomy  . Eye surgery      bilateral cataract  . Anterior cervical corpectomy  12/17/11  . Incision and drainage of wound  ~ 20ll; 12/03/11    "had infection in my right"  . Shoulder open rotator cuff repair  ~ 2011    right  . Peripherally inserted central catheter insertion  2011 & 11/2011  . Cataract extraction w/ intraocular lens  implant, bilateral  ? 2011  . Coronary artery bypass graft  2000    CABG X5  . Back surgery      lumbar  . Tonsillectomy  1949  . Cholecystectomy  2006 "or after"  . Coronary angioplasty with stent placement  2012  . Anterior cervical corpectomy  12/17/2011  Procedure: ANTERIOR CERVICAL CORPECTOMY;  Surgeon: Karn CassisErnesto M Botero, MD;  Location: [facility];  Service: Neurosurgery;  Laterality: N/A;  Anterior Cervical Decompression Fusion Five to Thoracic Two with plating  . Lumbar laminectomy/decompression microdiscectomy Right 06/17/2013    Procedure: LUMBAR LAMINECTOMY/DECOMPRESSION MICRODISCECTOMY 1 LEVEL;  Surgeon: Karn CassisErnesto M Botero, MD;  Location: [facility];  Service: Neurosurgery;  Laterality: Right;  Right L3-4 Microdiskectomy  . Lumbar wound debridement N/A 08/02/2013    Procedure: INCISION AND DRAINAGE OF LUMBAR WOUND DEBRIDEMENT;  Surgeon: Karn CassisErnesto M Botero, MD;  Location: [facility];  Service: Neurosurgery;  Laterality: N/A;  . Shoulder arthroscopy Left 09/13/2013    Procedure: ARTHROSCOPIC IRRIGATION AND  DEBRIDEMENT, SYNOVECTOMY ,  LEFT SHOULDER ;  Surgeon: Thera FlakeW D Caffrey Jr., MD;  Location: [facility];  Service: Orthopedics;  Laterality: Left;    JXB:JYNWGNROS:Review of systems complete and found to be negative unless listed above  PHYSICAL EXAM BP 145/54  Pulse 69  Ht 6' (1.829 m)  Wt 180 lb (81.647 kg)  BMI 24.41 kg/m2  SpO2 97%  General: Well developed, well nourished, in no acute distress, sitting in a wheelchair. Frail. Head: Eyes PERRLA, No xanthomas.   Normal cephalic and atramatic  Lungs: Clear bilaterally to auscultation and percussion. Heart: HRRR S1 S2, with 1/6 systolic murmur.  Pulses are 2+ & equal.            No carotid bruit. No JVD.  No abdominal bruits. No femoral bruits. Abdomen: Bowel sounds are positive, abdomen soft and non-tender without masses or                  Hernia's noted. Msk:  Back normal, wearing a brace,wheel chair for ambulation,. Overall diminishedl strength and tone for age. Extremities: No clubbing, cyanosis or edema. Leg braces noted.  DP +1 Neuro: Alert and oriented X 3. Psych:  Good affect, responds appropriately    ASSESSMENT AND PLAN

## 2013-10-11 NOTE — Patient Instructions (Signed)
Your physician wants you to follow-up in: 6 months You will receive a reminder letter in the mail two months in advance. If you don't receive a letter, please call our office to schedule the follow-up appointment.     Your physician recommends that you continue on your current medications as directed. Please refer to the Current Medication list given to you today.      Thank you for choosing [facility] !        

## 2013-10-14 DIAGNOSIS — F329 Major depressive disorder, single episode, unspecified: Secondary | ICD-10-CM | POA: Diagnosis not present

## 2013-10-14 DIAGNOSIS — F3289 Other specified depressive episodes: Secondary | ICD-10-CM | POA: Diagnosis not present

## 2013-10-18 DIAGNOSIS — F3289 Other specified depressive episodes: Secondary | ICD-10-CM | POA: Diagnosis not present

## 2013-10-18 DIAGNOSIS — F329 Major depressive disorder, single episode, unspecified: Secondary | ICD-10-CM | POA: Diagnosis not present

## 2013-11-03 ENCOUNTER — Ambulatory Visit: Payer: Medicare Other | Admitting: Orthopedic Surgery

## 2013-11-06 DIAGNOSIS — G834 Cauda equina syndrome: Secondary | ICD-10-CM | POA: Diagnosis not present

## 2013-11-07 NOTE — Progress Notes (Signed)
This is documentation of my visit with him at his skilled care facility on 11/06/2013. He has had a urinary tract infection which is been treated. He has now finished the antibiotics. He still has weakness of his legs. He is doing better in general. He still has inability to sense the need to urinate and is self catheterizing. He would like to see if he can cut down on his pain medication. He has not had any chest pain or any other new complaints  He is awake and alert in a wheelchair. His legs are very thin. His chest is clear. His heart is regular. His abdomen is soft without masses.  He has cauda equina syndrome which seems to be related to an abscess in a previous surgical site. He's had trouble with not being able to sense the need to urinate and he has been self catheterizing. He is still weak in his legs. He has chronic pain in his back because of multiple surgeries. He has a history of coronary artery occlusive disease which is stable. He had a urinary tract infection which is treated  Continue current treatments. He is hopeful of being able to be transferred out of the skilled care facility soon. He is hoping to seek a second opinion from a different neurosurgeon. He will reduce his pain medication.

## 2013-11-21 ENCOUNTER — Ambulatory Visit (HOSPITAL_COMMUNITY)
Admission: RE | Admit: 2013-11-21 | Discharge: 2013-11-21 | Disposition: A | Discharge status: Home / Self Care | Payer: Medicare Other | Source: Ambulatory Visit | Attending: Pulmonary Disease | Admitting: Pulmonary Disease

## 2013-11-21 DIAGNOSIS — M545 Low back pain, unspecified: Secondary | ICD-10-CM | POA: Diagnosis not present

## 2013-11-21 DIAGNOSIS — R29898 Other symptoms and signs involving the musculoskeletal system: Secondary | ICD-10-CM | POA: Diagnosis not present

## 2013-11-21 DIAGNOSIS — R262 Difficulty in walking, not elsewhere classified: Secondary | ICD-10-CM | POA: Insufficient documentation

## 2013-11-21 DIAGNOSIS — IMO0001 Reserved for inherently not codable concepts without codable children: Secondary | ICD-10-CM | POA: Diagnosis not present

## 2013-11-21 DIAGNOSIS — I10 Essential (primary) hypertension: Secondary | ICD-10-CM | POA: Insufficient documentation

## 2013-11-21 DIAGNOSIS — R2689 Other abnormalities of gait and mobility: Secondary | ICD-10-CM

## 2013-11-21 NOTE — Evaluation (Addendum)
Physical Therapy Evaluation  Patient Details  Name: [name] MRN: [id number] Date of Birth: 12/25/1940  Today's Date: 11/21/2013 Time: [id number] PT Time Calculation (min): 45 min Charge:  Evaluation             Visit#: 1 of 12  Re-eval: 12/21/13 Assessment Diagnosis: HNP Surgical Date:  (multiple) Next MD Visit: [name] 2 weeks  Authorization: medicare      Past Medical History:  Past Medical History  Diagnosis Date  . Hyperlipidemia   . Small bowel problem     HAD ALOT OF SCAR TISSUE FROM PREVIOUS SURGERIES..NG WAS INSERTED ...Marland Kitchen.Marland Kitchen.NO SURGERY NEEDED...Marland Kitchen.Marland Kitchen.IN FOR 8 DAYS  . Disorder of blood     BEEN TREATED BY DERMATOLOGIST X 4 YRS..."BLOOD BLISTERS"  . Arthritis   . Gout   . Anxiety   . Hx MRSA infection     rt shoulder  . Pneumonia     "I've had it 3-4 times"  . Coronary artery disease     IN 2000   STENT PLACED IN 2012 sees Jake. Dietrich [name], saw last 2013  . HTN (hypertension)     [place] Jake. [place]etta [name]  . Small bowel obstruction   . Ventral hernia   . Acute renal failure 04/17/2013  . 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 Anemia, iron deficiency 09/09/2013   Past Surgical History:  Past Surgical History  Procedure Laterality Date  . Sternal surg  2000    HAS HAD 5-6 ON HIS STERNUM; "caught MRSA in it"  . Lumbar laminectomy/decompression microdiscectomy  06/13/2011    Procedure: LUMBAR LAMINECTOMY/DECOMPRESSION MICRODISCECTOMY;  Surgeon: [name];  Location: [facility];  Service: Neurosurgery;  Laterality: N/A;  Lumbar three, lumbar four-five Laminectomy  . Eye surgery      bilateral cataract  . Anterior cervical corpectomy  12/17/11  . Incision and drainage of wound  ~ 20ll; 12/03/11    "had infection in my right"  . Shoulder open rotator cuff repair  ~ 2011    right  . Peripherally inserted central catheter insertion  2011 & 11/2011  . Cataract extraction Jake/ intraocular lens  implant, bilateral  ? 2011  . Coronary artery  bypass graft  2000    CABG X5  . Back surgery      lumbar  . Tonsillectomy  1949  . Cholecystectomy  2006 "or after"  . Coronary angioplasty with stent placement  2012  . Anterior cervical corpectomy  12/17/2011    Procedure: ANTERIOR CERVICAL CORPECTOMY;  Surgeon: Jake CassisErnesto M Botero, MD;  Location: [facility];  Service: Neurosurgery;  Laterality: N/A;  Anterior Cervical Decompression Fusion Five to Thoracic Two with plating  . Lumbar laminectomy/decompression microdiscectomy Right 06/17/2013    Procedure: LUMBAR LAMINECTOMY/DECOMPRESSION MICRODISCECTOMY 1 LEVEL;  Surgeon: Jake CassisErnesto M Botero, MD;  Location: [facility];  Service: Neurosurgery;  Laterality: Right;  Right L3-4 Microdiskectomy  . Lumbar wound debridement N/A 08/02/2013    Procedure: INCISION AND DRAINAGE OF LUMBAR WOUND DEBRIDEMENT;  Surgeon: Jake CassisErnesto M Botero, MD;  Location: [facility];  Service: Neurosurgery;  Laterality: N/A;  . Shoulder arthroscopy Left 09/13/2013    Procedure: ARTHROSCOPIC IRRIGATION AND DEBRIDEMENT, SYNOVECTOMY ,  LEFT SHOULDER ;  Surgeon: Thera FlakeW D Caffrey Jr., MD;  Location: [facility];  Service: Orthopedics;  Laterality: Left;    Subjective Symptoms/Limitations Symptoms: [name] states he was initally hospitalized for back surgery 11/2012 with relief of pain but Lt drop foot.  He had a second surgery in November for his right side.  He was suppose to be in overnight but due to complications he ended up in the hospital for several days he went IP rehab and went home right before Christmas.  He then had a flare infection in January and was admitted for an UTI and a Lt rotator cuff surgery.  He  then went to the [facility].  He stayed at the [facility] for 9 weeks and then went home on 11/18/2013.     Since he has been home he is having difficulty walkkiing.  He is using a walker with wheels at home. Prior to this pt was walking with no assistive device and was working on the side cleaning offices.  Pertinent History:  Back surgery 11/2012;  05/2013; 2000 pt had MRSA after his open heart surgery. He states that he had his strenum removed and then they used mm from his right and left arm for flaps.   How long can you sit comfortably?: Pt states he is not comfortable sitting and begins to adjust himself after about five minutes.  How long can you stand comfortably?: Pt is able to stand for three seconds without a walker due to balance issues; with a walker three minutes  How long can you walk comfortably?: Pt is able to walk with the walker for less than three minutes. Patient Stated Goals: I want to drive.  Pain Assessment Currently in Pain?: Yes Pain Score: 3  Pain Location: Back Pain Orientation: Lower Pain Type: Chronic pain Pain Onset: More than a month ago Pain Frequency: Constant Pain Relieving Factors: lying down Effect of Pain on Daily Activities: increases Multiple Pain Sites: Yes  Precautions/Restrictions  Precautions Precautions: Back  Balance Screening Balance Screen Has the patient fallen in the past 6 months: Yes (prior to surgery in November.  He has not fallen since )     Cognition/Observation Cognition Overall Cognitive Status: Within Functional Limits for tasks assessed  Sensation/Coordination/Flexibility/Functional Tests Functional Tests Functional Tests: foto 35  Assessment RLE Strength Right Hip Flexion: 5/5 Right Hip Extension: 2/5 Right Hip ABduction: 2-/5 Right Knee Flexion: 2/5 Right Knee Extension: 5/5 Right Ankle Dorsiflexion: 3/5 LLE AROM (degrees) Left Knee Extension: 10 LLE Strength Left Hip Flexion: 3-/5 Left Hip Extension: 1/5 Left Hip ABduction: 2-/5 Left Knee Flexion: 2-/5 Left Knee Extension: 5/5 Left Ankle Dorsiflexion: 2-/5  Exercise/Treatments Mobility/Balance  Berg Balance Test Sit to Stand: Able to stand  independently using hands Standing Unsupported: Unable to stand 30 seconds unassisted Sitting with Back Unsupported but Feet  Supported on Floor or Stool: Able to sit safely and securely 2 minutes Stand to Sit: Uses backs of legs against chair to control descent Transfers: Able to transfer safely, definite need of hands Standing Unsupported with Eyes Closed: Needs help to keep from falling Standing Ubsupported with Feet Together: Needs help to attain position and unable to hold for 15 seconds From Standing, Reach Forward with Outstretched Arm: Can reach forward >5 cm safely (2") From Standing Position, Pick up Object from Floor: Unable to try/needs assist to keep balance (able to with walker not without) From Standing Position, Turn to Look Behind Over each Shoulder: Needs assist to keep from losing balance and falling Turn 360 Degrees: Needs assistance while turning Standing Unsupported, Alternately Place Feet on Step/Stool: Needs assistance to keep from falling or unable to try Standing Unsupported, One Foot in Front: Loses balance while stepping or standing Standing on One Leg:  Unable to try or needs assist to prevent fall Total Score: 14     Supine Heel Slides: 5 reps Hip Adduction Isometric: 5 reps Bridges: 5 reps Straight Leg Raises: 5 reps Other Supine Knee Exercises: clam; hip abduciton x 5      Physical Therapy Assessment and Plan PT Assessment and Plan Clinical Impression Statement: [name] is a 73 yo male with a long and complicated history.  He has recently been discharged from [facility] after having an infection in his back as well as a Lt rotator cuff tear.  He is now only able to walk short distances with a rolling walker prior to hospitalization pt was walking without an assistive device and was working part time.  Pt exam shows significant loss of balance as well as B LE weakness.  Pt will benefit from skilled therapy to maximize his functional ability and decrease his risk of falls. Pt will benefit from skilled therapeutic intervention in order to improve on the following  deficits: Decreased activity tolerance;Decreased balance;Decreased mobility;Decreased range of motion;Difficulty walking;Decreased strength Rehab Potential: Good PT Frequency: Min 3X/week PT Duration: 8 weeks PT Treatment/Interventions: Gait training;Therapeutic activities;Therapeutic exercise;Patient/family education;Neuromuscular re-education;Balance training PT Plan: begin balance and strengthening activities.  Manual stretching of Lt gastrocnemius mm  Goals Home Exercise Program Pt/caregiver will Perform Home Exercise Program: For increased strengthening PT Short Term Goals Time to Complete Short Term Goals: 2 weeks PT Short Term Goal 1: Pt to increase Berg balace by 5 to decrease risk of falling PT Short Term Goal 2: Pt to be walking for 10 mintues with rolling walker PT Short Term Goal 3: Pt to be able to stand for a minute with no assistive device to allow easier grooming  PT Long Term Goals Time to Complete Long Term Goals: 4 weeks PT Long Term Goal 1: Berg balance to be increased by 10 to allow decreased risk of falling PT Long Term Goal 2: Pt to be ambulating with walker x 30 minutes Long Term Goal 3: Pt to be able to stand without assistive device for five minutes to ease grooming Long Term Goal 4: Pt to be able to stand with walker for 15 minutes for socializing . PT Long Term Goal 5: strength of LE improved one grade to allow the above to occur  Problem List Patient Active Problem List   Diagnosis Date Noted  . Bilateral leg weakness 11/21/2013  . Poor balance 11/21/2013  . Difficulty in walking(719.7) 11/21/2013  . Shoulder pain, left 09/12/2013  . Anemia, iron deficiency 09/09/2013  . Dyspnea 09/07/2013  . UTI (urinary tract infection) 09/07/2013  . Acute CHF (congestive heart failure) 09/07/2013  . Elevated troponin 09/07/2013  . Fever and chills 09/06/2013  . Altered mental status 09/05/2013  . Neurogenic bowel 08/22/2013  . Cauda equina syndrome with neurogenic  bladder 08/22/2013  . Gout flare 08/22/2013  . Postoperative wound infection 08/22/2013  . Lumbar degenerative disc disease 08/02/2013  . Post-operative pain 07/29/2013  . Osteonecrosis 05/05/2013  . Small bowel obstruction 04/17/2013  . Acute renal failure 04/17/2013  . Medial meniscus, posterior horn derangement 02/17/2013  . Knee sprain and strain 02/17/2013  . Trigger point with neck pain 03/18/2012  . Rotator cuff tear arthropathy of right shoulder 09/08/2011  . CAD in native artery 01/09/2011  . Ankle fracture 12/25/2010  . Contusion of shoulder, left 12/25/2010  . PEMPHIGUS VULGARIS 07/02/2010  . MRSA 05/13/2010  . PRURITUS 05/13/2010  . SPINAL STENOSIS, LUMBAR 05/13/2010  .  HYPERLIPIDEMIA 04/24/2010  . GASTROESOPHAGEAL REFLUX DISEASE 04/24/2010  . VENTRAL HERNIA, INCISIONAL 04/24/2010  . DEGENERATIVE JOINT DISEASE 04/24/2010  . PYOGENIC ARTHRITIS, SHOULDER REGION 02/04/2010  . HYPERTENSION 01/01/2010  . RUPTURE ROTATOR CUFF 02/19/2009    PT - End of Session Equipment Utilized During Treatment: Gait belt General Behavior During Therapy: [facility] tasks assessed/performed PT Plan of Care PT Home Exercise Plan: given  GP Functional Assessment Tool Used: foto  Functional Limitation: Mobility: Walking and moving around Mobility: Walking and Moving Around Current Status ([id number](G8978): At least 60 percent but less than 80 percent impaired, limited or restricted Mobility: Walking and Moving Around Goal Status [phone](G8979): At least 40 percent but less than 60 percent impaired, limited or restricted  Bella KennedyCynthia J Caidyn Blossom 11/21/2013, 5:43 PM  Physician Documentation Your signature is required to indicate approval of the treatment plan as stated above.  Please sign and either send electronically or make a copy of this report for your files and return this physician signed original.   Please mark one 1.__approve of plan  2. ___approve of plan with the following  conditions.   ______________________________                                                          _____________________ Physician Signature                                                                                                             Date

## 2013-11-28 ENCOUNTER — Ambulatory Visit (HOSPITAL_COMMUNITY): Payer: Medicare Other | Admitting: Physical Therapy

## 2013-11-28 ENCOUNTER — Ambulatory Visit (HOSPITAL_COMMUNITY)
Admission: RE | Admit: 2013-11-28 | Discharge: 2013-11-28 | Disposition: A | Discharge status: Home / Self Care | Payer: Medicare Other | Source: Ambulatory Visit | Attending: Pulmonary Disease | Admitting: Pulmonary Disease

## 2013-11-28 DIAGNOSIS — M545 Low back pain, unspecified: Secondary | ICD-10-CM | POA: Diagnosis not present

## 2013-11-28 DIAGNOSIS — I10 Essential (primary) hypertension: Secondary | ICD-10-CM | POA: Insufficient documentation

## 2013-11-28 DIAGNOSIS — R262 Difficulty in walking, not elsewhere classified: Secondary | ICD-10-CM | POA: Insufficient documentation

## 2013-11-28 DIAGNOSIS — IMO0001 Reserved for inherently not codable concepts without codable children: Secondary | ICD-10-CM | POA: Diagnosis not present

## 2013-11-28 DIAGNOSIS — R29898 Other symptoms and signs involving the musculoskeletal system: Secondary | ICD-10-CM | POA: Diagnosis not present

## 2013-11-28 NOTE — Progress Notes (Signed)
Physical Therapy Treatment Patient Details  Name: [name] MRN: [id number] Date of Birth: 08/29/1940  Today's Date: 11/28/2013 Time: 4098-11911348-1428 PT Time Calculation (min): 40 min Charges: NMR x 10' Therex x 28'  Visit#: 2 of 12  Re-eval: 12/21/13  Authorization: medicare   Subjective: Symptoms/Limitations Symptoms: Pt states that he has been going to the gym and working on strengthening his thigh.  Pain Assessment Currently in Pain?: Yes Pain Score: 4  (After pain pill) Pain Location: Back Pain Orientation: Lower  Exercise/Treatments Standing Functional Squat: 10 reps Other Standing Knee Exercises: weight shift R/L A/P x 15 each; side stepping 3 RT at parallel bars  Other Standing Knee Exercises: standing without UE assistance x 10 max of 13" Seated Long Arc Quad: 10 reps;Both Other Seated Knee Exercises: Sit to stand x 10 with UE assist Supine Bridges: 10 reps Straight Leg Raises: 10 reps Other Supine Knee Exercises: bent knee raise x 10 bilateral  Physical Therapy Assessment and Plan PT Assessment and Plan Clinical Impression Statement: PTA facilitated exercises to improve functional strength, proprioceptive control and stability. Pt requires multimodal cueing to avoid hip abduction with sit to stand and functional squats secondary to hip weakness. Pt completes supine exercises with good form and minimal need for cueing. Pt appears to tolerate treatment well. Pt will benefit from skilled therapeutic intervention in order to improve on the following deficits: Decreased activity tolerance;Decreased balance;Decreased mobility;Decreased range of motion;Difficulty walking;Decreased strength Rehab Potential: Good PT Frequency: Min 3X/week PT Duration: 8 weeks PT Treatment/Interventions: Gait training;Therapeutic activities;Therapeutic exercise;Patient/family education;Neuromuscular re-education;Balance training PT Plan: Continue to progress functional strength and balance  per PT POC.     Problem List Patient Active Problem List   Diagnosis Date Noted  . Bilateral leg weakness 11/21/2013  . Poor balance 11/21/2013  . Difficulty in walking(719.7) 11/21/2013  . Shoulder pain, left 09/12/2013  . Anemia, iron deficiency 09/09/2013  . Dyspnea 09/07/2013  . UTI (urinary tract infection) 09/07/2013  . Acute CHF (congestive heart failure) 09/07/2013  . Elevated troponin 09/07/2013  . Fever and chills 09/06/2013  . Altered mental status 09/05/2013  . Neurogenic bowel 08/22/2013  . Cauda equina syndrome with neurogenic bladder 08/22/2013  . Gout flare 08/22/2013  . Postoperative wound infection 08/22/2013  . Lumbar degenerative disc disease 08/02/2013  . Post-operative pain 07/29/2013  . Osteonecrosis 05/05/2013  . Small bowel obstruction 04/17/2013  . Acute renal failure 04/17/2013  . Medial meniscus, posterior horn derangement 02/17/2013  . Knee sprain and strain 02/17/2013  . Trigger point with neck pain 03/18/2012  . Rotator cuff tear arthropathy of right shoulder 09/08/2011  . CAD in native artery 01/09/2011  . Ankle fracture 12/25/2010  . Contusion of shoulder, left 12/25/2010  . PEMPHIGUS VULGARIS 07/02/2010  . MRSA 05/13/2010  . PRURITUS 05/13/2010  . SPINAL STENOSIS, LUMBAR 05/13/2010  . HYPERLIPIDEMIA 04/24/2010  . GASTROESOPHAGEAL REFLUX DISEASE 04/24/2010  . VENTRAL HERNIA, INCISIONAL 04/24/2010  . DEGENERATIVE JOINT DISEASE 04/24/2010  . PYOGENIC ARTHRITIS, SHOULDER REGION 02/04/2010  . HYPERTENSION 01/01/2010  . RUPTURE ROTATOR CUFF 02/19/2009    PT - End of Session Equipment Utilized During Treatment: Gait belt General Behavior During Therapy: [facility] for tasks assessed/performed  Seth Bakeebekah Urvi Imes, PTA 11/28/2013, 3:52 PM

## 2013-11-30 ENCOUNTER — Ambulatory Visit (HOSPITAL_COMMUNITY)
Admission: RE | Admit: 2013-11-30 | Discharge: 2013-11-30 | Disposition: A | Discharge status: Home / Self Care | Payer: Medicare Other | Source: Ambulatory Visit | Attending: Pulmonary Disease | Admitting: Pulmonary Disease

## 2013-11-30 DIAGNOSIS — R2689 Other abnormalities of gait and mobility: Secondary | ICD-10-CM

## 2013-11-30 DIAGNOSIS — R262 Difficulty in walking, not elsewhere classified: Secondary | ICD-10-CM

## 2013-11-30 DIAGNOSIS — IMO0001 Reserved for inherently not codable concepts without codable children: Secondary | ICD-10-CM | POA: Diagnosis not present

## 2013-11-30 DIAGNOSIS — R29898 Other symptoms and signs involving the musculoskeletal system: Secondary | ICD-10-CM

## 2013-11-30 NOTE — Progress Notes (Signed)
Physical Therapy Treatment Patient Details  Name: [name] MRN: [id number] Date of Birth: 12/13/1940  Today's Date: 11/30/2013 Time: 1110-1210 PT Time Calculation (min): 60 min Charge: TE [id number]  Visit#: 3 of 12  Re-eval: 12/21/13 Assessment Diagnosis: HNP Surgical Date:  (multiple) Next MD Visit: Dr Juanetta GoslingHawkins 12/27/2013  Authorization: medicare  Authorization Time Period:    Authorization Visit#:   of     Subjective: Symptoms/Limitations Symptoms: Pt reports he has been walking with RW and going to gym on days he doesnt have OPPT Pain Assessment Currently in Pain?: No/denies  Precautions/Restrictions  Precautions Precautions: Back  Exercise/Treatments Standing Heel Raises: Limitations Heel Raises Limitations: toe raises Forward Lunges: 10 reps Functional Squat: 15 reps Rocker Board: 1 minute;Limitations Rocker Board Limitations: R/L Gait Training: RW 2 sets x 35 feet SBA cueing for posture Other Standing Knee Exercises: weight shift R/L A/P x 15 each; side stepping and retro gait 5RT at parallel bars  Other Standing Knee Exercises: standing without UE assistance x 10 max of 15" Seated Long Arc Quad: Both;15 reps Other Seated Knee Exercises: Adduction with ball 20x 5", Abduction with green tband 20x, plantar flexion 15x Supine Bridges: 15 reps Other Supine Knee Exercises: bent knee raise x 15 alternating bilateral Other Supine Knee Exercises: clam Bil with manual resistance 15x    Physical Therapy Assessment and Plan PT Assessment and Plan Clinical Impression Statement: Progressed standing activtiies for functional strengthening and to improve proprioception with balance activities to improve static standing and gait mechanics.  Added rockerboard to improve weight distribution with min assistance with HHA inside parallel bars.  Began lunges to improve funcitonal strengthening.  Added seated glut med strengthening exercises. PT Plan: Continue to progress  functional strength and balance per PT POC.  Begin Nustep for strengthening and activitiy tolerance and progress glut strengthening activtieis next session.    Goals Home Exercise Program Pt/caregiver will Perform Home Exercise Program: For increased strengthening PT Short Term Goals Time to Complete Short Term Goals: 2 weeks PT Short Term Goal 1: Pt to increase Berg balace by 5 to decrease risk of falling PT Short Term Goal 1 - Progress: Progressing toward goal PT Short Term Goal 2: Pt to be walking for 10 mintues with rolling walker PT Short Term Goal 2 - Progress: Progressing toward goal PT Short Term Goal 3: Pt to be able to stand for a minute with no assistive device to allow easier grooming  PT Short Term Goal 3 - Progress: Progressing toward goal PT Long Term Goals Time to Complete Long Term Goals: 4 weeks PT Long Term Goal 1: Berg balance to be increased by 10 to allow decreased risk of falling PT Long Term Goal 2: Pt to be ambulating with walker x 30 minutes Long Term Goal 3: Pt to be able to stand without assistive device for five minutes to ease grooming Long Term Goal 4: Pt to be able to stand with walker for 15 minutes for socializing . PT Long Term Goal 5: strength of LE improved one grade to allow the above to occur Long Term Goal 5 Progress: Progressing toward goal  Problem List Patient Active Problem List   Diagnosis Date Noted  . Bilateral leg weakness 11/21/2013  . Poor balance 11/21/2013  . Difficulty in walking(719.7) 11/21/2013  . Shoulder pain, left 09/12/2013  . Anemia, iron deficiency 09/09/2013  . Dyspnea 09/07/2013  . UTI (urinary tract infection) 09/07/2013  . Acute CHF (congestive heart failure) 09/07/2013  . Elevated troponin  09/07/2013  . Fever and chills 09/06/2013  . Altered mental status 09/05/2013  . Neurogenic bowel 08/22/2013  . Cauda equina syndrome with neurogenic bladder 08/22/2013  . Gout flare 08/22/2013  . Postoperative wound infection  08/22/2013  . Lumbar degenerative disc disease 08/02/2013  . Post-operative pain 07/29/2013  . Osteonecrosis 05/05/2013  . Small bowel obstruction 04/17/2013  . Acute renal failure 04/17/2013  . Medial meniscus, posterior horn derangement 02/17/2013  . Knee sprain and strain 02/17/2013  . Trigger point with neck pain 03/18/2012  . Rotator cuff tear arthropathy of right shoulder 09/08/2011  . CAD in native artery 01/09/2011  . Ankle fracture 12/25/2010  . Contusion of shoulder, left 12/25/2010  . PEMPHIGUS VULGARIS 07/02/2010  . MRSA 05/13/2010  . PRURITUS 05/13/2010  . SPINAL STENOSIS, LUMBAR 05/13/2010  . HYPERLIPIDEMIA 04/24/2010  . GASTROESOPHAGEAL REFLUX DISEASE 04/24/2010  . VENTRAL HERNIA, INCISIONAL 04/24/2010  . DEGENERATIVE JOINT DISEASE 04/24/2010  . PYOGENIC ARTHRITIS, SHOULDER REGION 02/04/2010  . HYPERTENSION 01/01/2010  . RUPTURE ROTATOR CUFF 02/19/2009    PT - End of Session Equipment Utilized During Treatment: Gait belt Activity Tolerance: Patient tolerated treatment well General Behavior During Therapy: [facility] for tasks assessed/performed  GP    [name] 11/30/2013, 12:23 PM

## 2013-12-02 ENCOUNTER — Ambulatory Visit (HOSPITAL_COMMUNITY)
Admission: RE | Admit: 2013-12-02 | Discharge: 2013-12-02 | Disposition: A | Discharge status: Home / Self Care | Payer: Medicare Other | Source: Ambulatory Visit | Attending: Pulmonary Disease | Admitting: Pulmonary Disease

## 2013-12-02 DIAGNOSIS — R262 Difficulty in walking, not elsewhere classified: Secondary | ICD-10-CM

## 2013-12-02 DIAGNOSIS — IMO0001 Reserved for inherently not codable concepts without codable children: Secondary | ICD-10-CM | POA: Diagnosis not present

## 2013-12-02 DIAGNOSIS — R29898 Other symptoms and signs involving the musculoskeletal system: Secondary | ICD-10-CM

## 2013-12-02 DIAGNOSIS — R2689 Other abnormalities of gait and mobility: Secondary | ICD-10-CM

## 2013-12-02 NOTE — Progress Notes (Signed)
Physical Therapy Treatment Patient Details  Name: [name] MRN: [id number] Date of Birth: 09/14/1940  Today's Date: 12/02/2013 Time: 1308-1350 PT Time Calculation (min): 42 min Charge:  Neuromuscular [id number]; there ex 1339-1350 Visit#: 4 of 12  Re-eval: 12/21/13        Authorization Visit#: 4 of 10   Subjective: Symptoms/Limitations Symptoms: Pt states he has been going to the gym  Pain Assessment Currently in Pain?: No/denies  Precautions/Restrictions     Exercise/Treatments      Balance Exercises Standing Standing Eyes Opened: Wide (BOA);Solid surface;3 reps (repeat with narrow base of support) Sidestepping: 2 reps Marching: 10 reps Sit to Stand: Standard surface (x10) Other Standing Exercises: wt shifting Rt/Lt; and anterior posteriro.      Seated Other Seated Exercises: hip in and out; Ankle dorsiflexion/ plantarflexion Other Seated Exercises: nustep L5 x 8 minutes.      Physical Therapy Assessment and Plan PT Assessment and Plan Clinical Impression Statement: All exercises facilitated by therapist.   Pt needed multiple rest breaks but recovered quickily PT Plan: begin PROM of ankles next session    Goals    Problem List Patient Active Problem List   Diagnosis Date Noted  . Bilateral leg weakness 11/21/2013  . Poor balance 11/21/2013  . Difficulty in walking(719.7) 11/21/2013  . Shoulder pain, left 09/12/2013  . Anemia, iron deficiency 09/09/2013  . Dyspnea 09/07/2013  . UTI (urinary tract infection) 09/07/2013  . Acute CHF (congestive heart failure) 09/07/2013  . Elevated troponin 09/07/2013  . Fever and chills 09/06/2013  . Altered mental status 09/05/2013  . Neurogenic bowel 08/22/2013  . Cauda equina syndrome with neurogenic bladder 08/22/2013  . Gout flare 08/22/2013  . Postoperative wound infection 08/22/2013  . Lumbar degenerative disc disease 08/02/2013  . Post-operative pain 07/29/2013  . Osteonecrosis 05/05/2013  . Small  bowel obstruction 04/17/2013  . Acute renal failure 04/17/2013  . Medial meniscus, posterior horn derangement 02/17/2013  . Knee sprain and strain 02/17/2013  . Trigger point with neck pain 03/18/2012  . Rotator cuff tear arthropathy of right shoulder 09/08/2011  . CAD in native artery 01/09/2011  . Ankle fracture 12/25/2010  . Contusion of shoulder, left 12/25/2010  . PEMPHIGUS VULGARIS 07/02/2010  . MRSA 05/13/2010  . PRURITUS 05/13/2010  . SPINAL STENOSIS, LUMBAR 05/13/2010  . HYPERLIPIDEMIA 04/24/2010  . GASTROESOPHAGEAL REFLUX DISEASE 04/24/2010  . VENTRAL HERNIA, INCISIONAL 04/24/2010  . DEGENERATIVE JOINT DISEASE 04/24/2010  . PYOGENIC ARTHRITIS, SHOULDER REGION 02/04/2010  . HYPERTENSION 01/01/2010  . RUPTURE ROTATOR CUFF 02/19/2009    PT - End of Session Equipment Utilized During Treatment: Gait belt Activity Tolerance: Patient tolerated treatment well General Behavior During Therapy: [facility] for tasks assessed/performed  GP    Bella KennedyCynthia J Russell 12/02/2013, 1:51 PM

## 2013-12-05 ENCOUNTER — Ambulatory Visit (HOSPITAL_COMMUNITY)
Admission: RE | Admit: 2013-12-05 | Discharge: 2013-12-05 | Disposition: A | Discharge status: Home / Self Care | Payer: Medicare Other | Source: Ambulatory Visit | Attending: Pulmonary Disease | Admitting: Pulmonary Disease

## 2013-12-05 DIAGNOSIS — M25612 Stiffness of left shoulder, not elsewhere classified: Secondary | ICD-10-CM | POA: Insufficient documentation

## 2013-12-05 DIAGNOSIS — M6289 Other specified disorders of muscle: Secondary | ICD-10-CM | POA: Insufficient documentation

## 2013-12-05 DIAGNOSIS — M629 Disorder of muscle, unspecified: Secondary | ICD-10-CM | POA: Insufficient documentation

## 2013-12-05 DIAGNOSIS — M25611 Stiffness of right shoulder, not elsewhere classified: Secondary | ICD-10-CM

## 2013-12-05 DIAGNOSIS — IMO0001 Reserved for inherently not codable concepts without codable children: Secondary | ICD-10-CM | POA: Diagnosis not present

## 2013-12-05 DIAGNOSIS — M6281 Muscle weakness (generalized): Secondary | ICD-10-CM | POA: Insufficient documentation

## 2013-12-05 NOTE — Evaluation (Signed)
Occupational Therapy Evaluation  Patient Details  Name: Jake MarvelRonald W Holycross MRN: [id number] Date of Birth: 11/27/1940  Today's Date: 12/05/2013 Time: [id number] OT Time Calculation (min): 39 min Eval 39'  Visit#:   of    Re-eval: 01/02/14  Assessment Diagnosis: Bilateral Upper Extremity Weakness Surgical Date:  (Back surgery in November 2014)  Authorization: Medicare  Authorization Time Period: Before 10th visit  Authorization Visit#: 1 of 10   Past Medical History:  Past Medical History  Diagnosis Date  . Hyperlipidemia   . Small bowel problem     HAD ALOT OF SCAR TISSUE FROM PREVIOUS SURGERIES..NG WAS INSERTED ...Marland Kitchen.Marland Kitchen.NO SURGERY NEEDED...Marland Kitchen.Marland Kitchen.IN FOR 8 DAYS  . Disorder of blood     BEEN TREATED BY DERMATOLOGIST X 4 YRS..."BLOOD BLISTERS"  . Arthritis   . Gout   . Anxiety   . Hx MRSA infection     rt shoulder  . Pneumonia     "I've had it 3-4 times"  . Coronary artery disease     IN 2000   STENT PLACED IN 2012 sees Dr. Dietrich Patesothbart, saw last 2013  . HTN (hypertension)     [place]. [place]  . Small bowel obstruction   . Ventral hernia   . Acute renal failure 04/17/2013  . 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 Anemia, iron deficiency 09/09/2013   Past Surgical History:  Past Surgical History  Procedure Laterality Date  . Sternal surg  2000    HAS HAD 5-6 ON HIS STERNUM; "caught MRSA in it"  . Lumbar laminectomy/decompression microdiscectomy  06/13/2011    Procedure: LUMBAR LAMINECTOMY/DECOMPRESSION MICRODISCECTOMY;  Surgeon: Karn CassisErnesto M Botero;  Location: [facility];  Service: Neurosurgery;  Laterality: N/A;  Lumbar three, lumbar four-five Laminectomy  . Eye surgery      bilateral cataract  . Anterior cervical corpectomy  12/17/11  . Incision and drainage of wound  ~ 20ll; 12/03/11    "had infection in my right"  . Shoulder open rotator cuff repair  ~ 2011    right  . Peripherally inserted central catheter insertion  2011 & 11/2011  . Cataract  extraction w/ intraocular lens  implant, bilateral  ? 2011  . Coronary artery bypass graft  2000    CABG X5  . Back surgery      lumbar  . Tonsillectomy  1949  . Cholecystectomy  2006 "or after"  . Coronary angioplasty with stent placement  2012  . Anterior cervical corpectomy  12/17/2011    Procedure: ANTERIOR CERVICAL CORPECTOMY;  Surgeon: Karn CassisErnesto M Botero, MD;  Location: [facility];  Service: Neurosurgery;  Laterality: N/A;  Anterior Cervical Decompression Fusion Five to Thoracic Two with plating  . Lumbar laminectomy/decompression microdiscectomy Right 06/17/2013    Procedure: LUMBAR LAMINECTOMY/DECOMPRESSION MICRODISCECTOMY 1 LEVEL;  Surgeon: Karn CassisErnesto M Botero, MD;  Location: [facility];  Service: Neurosurgery;  Laterality: Right;  Right L3-4 Microdiskectomy  . Lumbar wound debridement N/A 08/02/2013    Procedure: INCISION AND DRAINAGE OF LUMBAR WOUND DEBRIDEMENT;  Surgeon: Karn CassisErnesto M Botero, MD;  Location: [facility];  Service: Neurosurgery;  Laterality: N/A;  . Shoulder arthroscopy Left 09/13/2013    Procedure: ARTHROSCOPIC IRRIGATION AND DEBRIDEMENT, SYNOVECTOMY ,  LEFT SHOULDER ;  Surgeon: Thera FlakeW D Caffrey Jr., MD;  Location: [facility];  Service: Orthopedics;  Laterality: Left;    Subjective Symptoms/Limitations Symptoms: S: "Both rotator cuffs are gone." Pertinent History: Pt is 73 yo presenting to outpatient OT with bilateral  upper extremity weakness.  He had back surgery in November with several complications and was in rehab at the [facility] for 2 months.  His rotator cuffs were weak prior to suegery, but with increased disuse since his surgery have become increasingly weaker.  Pt does not want to have rotator cuff repair surgery.    Of note, pt also stated that with previous surgery for the removal of his sternum the MDs took muscles form his arms to close his chest incision, resulting in decreased strength. Limitations: Reaching, lifting Special Tests: FOTO Patient Stated Goals: To  get stronger, to help carry myself on my walker, end evenually a cane. Pain Assessment Currently in Pain?: Yes Pain Score: 3  Pain Location: Shoulder Pain Orientation: Right;Left Pain Type: Chronic pain  Precautions/Restrictions  Precautions Precautions: Back  Balance Screening Balance Screen Has the patient fallen in the past 6 months: Yes How many times?: Once, in november Has the patient had a decrease in activity level because of a fear of falling? : No Is the patient reluctant to leave their home because of a fear of falling? 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  Still has minor jobs prior to November. Leisure: Hobbies-yes (Comment)  Assessment ADL/Vision/Perception ADL ADL Comments: pt is able to dress and bathe with modified independence.  pt currenlty having difficulty with raising his arms and reaching in order to grasp objects needs fro grooming. Dominant Hand: Left Vision - History Baseline Vision: Wears glasses all the time  Cognition/Observation Cognition Overall Cognitive Status: Within Functional Limits for tasks assessed Arousal/Alertness: Awake/alert Orientation Level: Oriented X4 Observation/Other Assessments Observations: Pt arrived in wheelchair. Able to self-propel with both food and arms iinto OT eval room.  Sensation/Coordination/Edema Sensation Light Touch: Appears Intact Additional Comments: Pt has occassional numbness in LUE from shoulder to palm/fingertips.  Mostly occurs at night and resolves with repositioning. Coordination Gross  Motor Movements are Fluid and Coordinated: No Fine Motor Movements are Fluid and Coordinated: Yes  Additional Assessments RUE AROM (degrees) RUE Overall AROM Comments: Assessed in seated/supine, with ER/IR shoulder adducted Right Shoulder Flexion:  (36/153) Right Shoulder ABduction:  (35/35) Right Shoulder Internal Rotation:  (90/84) Right Shoulder External Rotation:  (42/81) RUE PROM (degrees) RUE Overall PROM Comments: Assessed in supine, with shoulder adducted ER/IR Right Shoulder Flexion: 154 Degrees RUE Strength RUE Overall Strength Comments: Assess in sitting, with shoulder adducted ER/IR Right Shoulder Flexion: 3-/5 Right Shoulder ABduction: 3-/5 Right Shoulder Internal Rotation:  (4-/5) Right Shoulder External Rotation: 3+/5 Grip (lbs): 46 LUE AROM (degrees) LUE Overall AROM Comments: Assessed in sitting/supine, with shoulder adducted ER/IR Left Shoulder Flexion:  (30/43) Left Shoulder ABduction:  (34/21) Left Shoulder Internal Rotation:  (73/79) Left Shoulder External Rotation:  (62/72) LUE PROM (degrees) LUE Overall PROM Comments: Assessed in supine, with shoulder adducted ER/IR Left Shoulder Flexion: 142 Degrees Left Shoulder ABduction: 95 Degrees Left Shoulder Internal Rotation: 84 Degrees Left Shoulder External Rotation: 81 Degrees LUE Strength LUE Overall Strength Comments: Assessed in sitting Left Shoulder Flexion: 3-/5 Left Shoulder ABduction: 3-/5 Left Shoulder Internal Rotation: 3+/5 Left Shoulder External Rotation: 3+/5 Grip (lbs): 39 Palpation Palpation: On left side pt has min fascial restrictions in his bicep and  anterior shoulder regions, and moderate restrictions in his scapular and upper trap region.  On his right pt has moderate fascial restricions in his bicep, scapular and trap regions.    Occupational Therapy Assessment and Plan OT Assessment and Plan Clinical Impression Statement: Pt is presenting to outpatient OT with BUE weakness. Pt has  decreased ROM in bilateral shoulders, decreased stength in bilateral shoulders and hands, increased pain with movement, increased fascial restrictions, and decreased ADL status as a result. Rehab Potential: Good OT Frequency: Min 2X/week OT Duration: 8 weeks OT Treatment/Interventions: Self-care/ADL training;Therapeutic exercise;Manual therapy;Modalities;Therapeutic activities;Patient/family education OT Plan: Pt will benefit from skilled OT services for increasing BUE ROM, decreasing fascial restrictions, improving strength, and improving overall BUE functional use.     Treatment Plan: Give HEP and DASH, PROM/AAROM/AROM, myofascial release and manual stretching, proximal shoulder strengthening, scapular stabilization, general  ROM strengthening   Goals Short Term Goals Short Term Goal 1: Pt wil be educated on HEP Short Term Goal 2: Pt will achieve BUE shoulder AROM WFL in supine in working towards improving his golf swing. Short Term Goal 3: Pt will improve BUE strength to atlesat 4-/5 for improved ability to reach for with shaving supplies during morning ADLs. Short Term Goal 4: Pt will have decreased fascial restrictions to min-mod in BUE for decrased pain with motion. Short Term Goal 5: Pt will improve bilateral grip strength by atleast 5# each. Long Term Goals Long Term Goal 1: Pt will achieve highest level of functioning with all ADLs, IADLs, and leisure tasks when using BUE. Long Term Goal 2: Pt will achieve BUE shoulder AROM WFL in standing in working towards inmprving his golf swing. Long Term Goal 3: Pt will improve BUE shoulder strength to atleast 4/5 for improved ability to reach for items off of shelves. Long Term Goal 4: Pt will have decreased fascial restictions to trace-min in BUE. Long Term Goal 5: Pt will improve bilateral grip strength by atleast 10# each. Additional Long Term Goals?: Yes Long Term Goal 6: Pt will verbalize less than 1/10 pain in his shoulder with  movement.  Problem List Patient Active Problem List   Diagnosis Date Noted  . Muscle weakness (generalized) 12/05/2013  . Tight fascia 12/05/2013  . Decreased range of motion of right shoulder 12/05/2013  . Decreased range of motion of left shoulder 12/05/2013  . Bilateral leg weakness 11/21/2013  . Poor balance 11/21/2013  . Difficulty in walking(719.7) 11/21/2013  . Shoulder pain, left 09/12/2013  . Anemia, iron deficiency 09/09/2013  . Dyspnea 09/07/2013  . UTI (urinary tract infection) 09/07/2013  . Acute CHF (congestive heart failure) 09/07/2013  . Elevated troponin 09/07/2013  . Fever and chills 09/06/2013  . Altered mental status 09/05/2013  . Neurogenic bowel 08/22/2013  . Cauda equina syndrome with neurogenic bladder 08/22/2013  . Gout flare 08/22/2013  . Postoperative wound infection 08/22/2013  . Lumbar degenerative disc disease 08/02/2013  . Post-operative pain 07/29/2013  . Osteonecrosis 05/05/2013  . Small bowel obstruction 04/17/2013  . Acute renal failure 04/17/2013  . Medial meniscus, posterior horn derangement 02/17/2013  . Knee sprain and strain 02/17/2013  . Trigger point with neck pain 03/18/2012  . Rotator cuff tear arthropathy of right shoulder 09/08/2011  . CAD in native artery 01/09/2011  . Ankle fracture 12/25/2010  . Contusion of shoulder, left 12/25/2010  . PEMPHIGUS VULGARIS 07/02/2010  . MRSA 05/13/2010  . PRURITUS 05/13/2010  . SPINAL STENOSIS, LUMBAR 05/13/2010  . HYPERLIPIDEMIA 04/24/2010  . GASTROESOPHAGEAL REFLUX  DISEASE 04/24/2010  . VENTRAL HERNIA, INCISIONAL 04/24/2010  . DEGENERATIVE JOINT DISEASE 04/24/2010  . PYOGENIC ARTHRITIS, SHOULDER REGION 02/04/2010  . HYPERTENSION 01/01/2010  . RUPTURE ROTATOR CUFF 02/19/2009    End of Session Activity Tolerance: Patient tolerated treatment well General Behavior During Therapy: [facility] for tasks assessed/performed  GO Functional Assessment Tool Used: Clinical Judgement Functional  Limitation: Carrying, moving and handling objects Carrying, Moving and Handling Objects Current Status (W0981): At least 60 percent but less than 80 percent impaired, limited or restricted Carrying, Moving and Handling Objects Goal Status [phone]): At least 1 percent but less than 20 percent impaired, limited or restricted  Marry Guan, MS, OTR/L [phone]  12/05/2013, 4:57 PM  Physician Documentation Your signature is required to indicate approval of the treatment plan as stated above.  Please sign and either send electronically or make a copy of this report for your files and return this physician signed original.  Please mark one 1.__approve of plan  2. ___approve of plan with the following conditions.   ______________________________                                                          _____________________ Physician Signature                                                                                                             Date

## 2013-12-05 NOTE — Progress Notes (Signed)
Physical Therapy Treatment Patient Details  Name: [name] MRN: [id number] Date of Birth: 08/06/1940  Today's Date: 12/05/2013 Time: 3086-57841109-1155 PT Time Calculation (min): 46 min Charges: NMR x 10'(1112-1122) Therex x 69'([id number]'(1124-11570  Visit#: 5 of 12  Re-eval: 12/21/13  Authorization: Medicare  Authorization Visit#: 5 of 10   Subjective: Symptoms/Limitations Symptoms: Pt states that he walked quite a bit yesterday. He reprots increased radicular sx in BLE last night. Pain Assessment Currently in Pain?: Yes Pain Score: 2  (Increased to 6/10 with standing) Pain Location: Back Pain Orientation: Right;Left   Exercise/Treatments Aerobic Stationary Bike: NuStep x10' L5 hills #3 to improve strength and activity tolerance Standing Other Standing Knee Exercises: weight shift right/left 2x5 Other Standing Knee Exercises: Sit to stand x 5 with moderate assistance to maintain balance Seated Long Arc Quad: 10 reps;Limitations Long Arc Quad Limitations: on dynadisc Other Seated Knee Exercises: seated march on dynadisc x 10 heel raise/toe raises x 10; left ankle PROM Other Seated Knee Exercises: Abduction with blue tband; heel roll outs adduction x 10  Physical Therapy Assessment and Plan PT Assessment and Plan Clinical Impression Statement: Treatment limited by back pain with standing activities. Pt unable to take any steps secondary to significant lumbar pain. Therapist adjusted treatment accordingly. Pt attributes back pain to increasing activity over the weekend. Began seated balance activities on dynadisc with LOB x1 with recovering using UE assistance. Pt tolerates seated exercises well.  PT Plan: Progress standing activities as pain allows.      Problem List Patient Active Problem List   Diagnosis Date Noted  . Muscle weakness (generalized) 12/05/2013  . Tight fascia 12/05/2013  . Decreased range of motion of right shoulder 12/05/2013  . Decreased range of motion of left  shoulder 12/05/2013  . Bilateral leg weakness 11/21/2013  . Poor balance 11/21/2013  . Difficulty in walking(719.7) 11/21/2013  . Shoulder pain, left 09/12/2013  . Anemia, iron deficiency 09/09/2013  . Dyspnea 09/07/2013  . UTI (urinary tract infection) 09/07/2013  . Acute CHF (congestive heart failure) 09/07/2013  . Elevated troponin 09/07/2013  . Fever and chills 09/06/2013  . Altered mental status 09/05/2013  . Neurogenic bowel 08/22/2013  . Cauda equina syndrome with neurogenic bladder 08/22/2013  . Gout flare 08/22/2013  . Postoperative wound infection 08/22/2013  . Lumbar degenerative disc disease 08/02/2013  . Post-operative pain 07/29/2013  . Osteonecrosis 05/05/2013  . Small bowel obstruction 04/17/2013  . Acute renal failure 04/17/2013  . Medial meniscus, posterior horn derangement 02/17/2013  . Knee sprain and strain 02/17/2013  . Trigger point with neck pain 03/18/2012  . Rotator cuff tear arthropathy of right shoulder 09/08/2011  . CAD in native artery 01/09/2011  . Ankle fracture 12/25/2010  . Contusion of shoulder, left 12/25/2010  . PEMPHIGUS VULGARIS 07/02/2010  . MRSA 05/13/2010  . PRURITUS 05/13/2010  . SPINAL STENOSIS, LUMBAR 05/13/2010  . HYPERLIPIDEMIA 04/24/2010  . GASTROESOPHAGEAL REFLUX DISEASE 04/24/2010  . VENTRAL HERNIA, INCISIONAL 04/24/2010  . DEGENERATIVE JOINT DISEASE 04/24/2010  . PYOGENIC ARTHRITIS, SHOULDER REGION 02/04/2010  . HYPERTENSION 01/01/2010  . RUPTURE ROTATOR CUFF 02/19/2009    PT - End of Session Equipment Utilized During Treatment: Gait belt Activity Tolerance: Patient tolerated treatment well General Behavior During Therapy: [facility] for tasks assessed/performed  Seth Bakeebekah Armanii Urbanik, PTA 12/05/2013, 12:02 PM

## 2013-12-07 ENCOUNTER — Ambulatory Visit (HOSPITAL_COMMUNITY)
Admission: RE | Admit: 2013-12-07 | Discharge: 2013-12-07 | Disposition: A | Discharge status: Home / Self Care | Payer: Medicare Other | Source: Ambulatory Visit | Attending: Pulmonary Disease | Admitting: Pulmonary Disease

## 2013-12-07 DIAGNOSIS — R29898 Other symptoms and signs involving the musculoskeletal system: Secondary | ICD-10-CM

## 2013-12-07 DIAGNOSIS — R262 Difficulty in walking, not elsewhere classified: Secondary | ICD-10-CM

## 2013-12-07 DIAGNOSIS — IMO0001 Reserved for inherently not codable concepts without codable children: Secondary | ICD-10-CM | POA: Diagnosis not present

## 2013-12-07 DIAGNOSIS — R2689 Other abnormalities of gait and mobility: Secondary | ICD-10-CM

## 2013-12-07 NOTE — Progress Notes (Addendum)
Physical Therapy Treatment Patient Details  Name: Jake MarvelRonald W [name] MRN: [id number] Date of Birth: 02/06/1941  Today's Date: 12/07/2013 Time: 1107-1155 PT Time Calculation (min): 48 min   Charges TherEx 1107-1130, therEact 1130- 1155 Visit#: 6 of 12  Re-eval: 12/21/13   Authorization: Medicare Authorization Visit#: 6 of 10   Subjective: Symptoms/Limitations Symptoms: Patient states he hasnt been walking lately due to increased back soreness, but he feels better today  Pertinent History: Back surgery 11/2012;  05/2013; 2000 pt had MRSA after his open heart surgery. He states that he had his strenum removed and then they used mm from his right and left arm for flaps.   Pain Assessment Currently in Pain?: No/denies  Exercise/Treatments Aerobic Stationary Bike: NuStep x10' L5 hills #3 to improve strength and activity tolerance Standing Forward Lunges: Limitations Forward Lunges Limitations: 10 reps;Step Height: 8";Both Other Standing Knee Exercises: split stance weight shift frontal plane and sagittal plane 10x,  3D hip excursions 10x  Sit to stand 4x 5 with moderate assistance to maintain balance Marching to 8" box 10x each  Side stepping in parallel bars 4x Standing hip abduction 10x each   Physical Therapy Assessment and Plan PT Assessment and Plan Clinical Impression Statement: Patient had no back pain this session but treament still limited  due to limited activtiy tolerance . Patient was able to toletate standing exercises with focus on increasing weight shifting from Rt to Lt LE and demonstrated gait twith walking with patient having limited ability to  ambulate with terminal knee extension. Patient tolerated exercises well and stated that it felt good at the end of the session.  Pt will benefit from skilled therapeutic intervention in order to improve on the following deficits: Impaired UE functional use;Increased fascial restricitons;Decreased range of motion;Decreased  strength;Pain Rehab Potential: Good PT Treatment/Interventions: Gait training;Therapeutic activities;Therapeutic exercise;Patient/family education;Neuromuscular re-education;Balance training PT Plan: Progress standing activities as pain allows with focus on increased glut strength and improvign ability to control weight shift    Goals PT Short Term Goals PT Short Term Goal 1: Pt to increase Berg balace by 5 to decrease risk of falling PT Short Term Goal 1 - Progress: Progressing toward goal PT Short Term Goal 2: Pt to be walking for 10 mintues with rolling walker PT Short Term Goal 2 - Progress: Progressing toward goal PT Short Term Goal 3: Pt to be able to stand for a minute with no assistive device to allow easier grooming  PT Short Term Goal 3 - Progress: Progressing toward goal PT Long Term Goals PT Long Term Goal 1: Berg balance to be increased by 10 to allow decreased risk of falling PT Long Term Goal 1 - Progress: Progressing toward goal PT Long Term Goal 2: Pt to be ambulating with walker x 30 minutes PT Long Term Goal 2 - Progress: Progressing toward goal Long Term Goal 3: Pt to be able to stand without assistive device for five minutes to ease grooming Long Term Goal 3 Progress: Progressing toward goal Long Term Goal 4: Pt to be able to stand with walker for 15 minutes for socializing . Long Term Goal 4 Progress: Progressing toward goal PT Long Term Goal 5: strength of LE improved one grade to allow the above to occur Long Term Goal 5 Progress: Progressing toward goal  Problem List Patient Active Problem List   Diagnosis Date Noted  . Muscle weakness (generalized) 12/05/2013  . Tight fascia 12/05/2013  . Decreased range of motion of right shoulder 12/05/2013  .  Decreased range of motion of left shoulder 12/05/2013  . Bilateral leg weakness 11/21/2013  . Poor balance 11/21/2013  . Difficulty in walking(719.7) 11/21/2013  . Shoulder pain, left 09/12/2013  . Anemia, iron  deficiency 09/09/2013  . Dyspnea 09/07/2013  . UTI (urinary tract infection) 09/07/2013  . Acute CHF (congestive heart failure) 09/07/2013  . Elevated troponin 09/07/2013  . Fever and chills 09/06/2013  . Altered mental status 09/05/2013  . Neurogenic bowel 08/22/2013  . Cauda equina syndrome with neurogenic bladder 08/22/2013  . Gout flare 08/22/2013  . Postoperative wound infection 08/22/2013  . Lumbar degenerative disc disease 08/02/2013  . Post-operative pain 07/29/2013  . Osteonecrosis 05/05/2013  . Small bowel obstruction 04/17/2013  . Acute renal failure 04/17/2013  . Medial meniscus, posterior horn derangement 02/17/2013  . Knee sprain and strain 02/17/2013  . Trigger point with neck pain 03/18/2012  . Rotator cuff tear arthropathy of right shoulder 09/08/2011  . CAD in native artery 01/09/2011  . Ankle fracture 12/25/2010  . Contusion of shoulder, left 12/25/2010  . PEMPHIGUS VULGARIS 07/02/2010  . MRSA 05/13/2010  . PRURITUS 05/13/2010  . SPINAL STENOSIS, LUMBAR 05/13/2010  . HYPERLIPIDEMIA 04/24/2010  . GASTROESOPHAGEAL REFLUX DISEASE 04/24/2010  . VENTRAL HERNIA, INCISIONAL 04/24/2010  . DEGENERATIVE JOINT DISEASE 04/24/2010  . PYOGENIC ARTHRITIS, SHOULDER REGION 02/04/2010  . HYPERTENSION 01/01/2010  . RUPTURE ROTATOR CUFF 02/19/2009    PT - End of Session Equipment Utilized During Treatment: Gait belt Activity Tolerance: Patient tolerated treatment well  GP    Xanthe Couillard R Walaa Carel PT DPT 12/07/2013, 12:04 PM

## 2013-12-09 ENCOUNTER — Ambulatory Visit (HOSPITAL_COMMUNITY)
Admission: RE | Admit: 2013-12-09 | Discharge: 2013-12-09 | Disposition: A | Discharge status: Home / Self Care | Payer: Medicare Other | Source: Ambulatory Visit | Attending: Pulmonary Disease | Admitting: Pulmonary Disease

## 2013-12-09 DIAGNOSIS — R262 Difficulty in walking, not elsewhere classified: Secondary | ICD-10-CM

## 2013-12-09 DIAGNOSIS — R29898 Other symptoms and signs involving the musculoskeletal system: Secondary | ICD-10-CM

## 2013-12-09 DIAGNOSIS — IMO0001 Reserved for inherently not codable concepts without codable children: Secondary | ICD-10-CM | POA: Diagnosis not present

## 2013-12-09 DIAGNOSIS — R2689 Other abnormalities of gait and mobility: Secondary | ICD-10-CM

## 2013-12-09 NOTE — Progress Notes (Signed)
  Physical Therapy Treatment Patient Details  Name: Jake MarvelRonald W Pinette MRN: [id number] Date of Birth: 03/17/1941  Today's Date: 12/09/2013 Time: 1305-1350 PT Time Calculation (min): 45 min Charge there ex [id number]; neuromuscular reed 1316-1350 Visit#: 8 of 12  Re-eval: 12/21/13    Authorization: Medicare  Authorization Visit#: 8 of 10   Subjective: Symptoms/Limitations Symptoms: I haven't been able to walk due to having out of town guests.      Exercise/Treatments Standing Standing Eyes Opened: Narrow base of support (BOS);Head turns;5 reps (standing playing catch x 10) Tandem Stance: Eyes open Sidestepping: 2 reps Marching: 10 reps Sit to Stand: Standard surface (5 x Rt back; 5 x Lt back) Other Standing Exercises: wt shifting side to side as well as forward and back x 10; hip 3 D excursion x 5       Seated Other Seated Exercises: nustep L5 x 10 minutes.       Physical Therapy Assessment and Plan PT Assessment and Plan Clinical Impression Statement: Pt exercises facilitated by therapist.  Pt needed multiple rest breaks throughout treatemt.  Pt needs cuing to keep good form ie with squats pt allows knees to go in; tandem stance pt will stand wth SB if not cued.  PT Plan: Continue with weight bearing exercises to improve activity tolerace.     Goals    Problem List Patient Active Problem List   Diagnosis Date Noted  . Muscle weakness (generalized) 12/05/2013  . Tight fascia 12/05/2013  . Decreased range of motion of right shoulder 12/05/2013  . Decreased range of motion of left shoulder 12/05/2013  . Bilateral leg weakness 11/21/2013  . Poor balance 11/21/2013  . Difficulty in walking(719.7) 11/21/2013  . Shoulder pain, left 09/12/2013  . Anemia, iron deficiency 09/09/2013  . Dyspnea 09/07/2013  . UTI (urinary tract infection) 09/07/2013  . Acute CHF (congestive heart failure) 09/07/2013  . Elevated troponin 09/07/2013  . Fever and chills 09/06/2013  . Altered  mental status 09/05/2013  . Neurogenic bowel 08/22/2013  . Cauda equina syndrome with neurogenic bladder 08/22/2013  . Gout flare 08/22/2013  . Postoperative wound infection 08/22/2013  . Lumbar degenerative disc disease 08/02/2013  . Post-operative pain 07/29/2013  . Osteonecrosis 05/05/2013  . Small bowel obstruction 04/17/2013  . Acute renal failure 04/17/2013  . Medial meniscus, posterior horn derangement 02/17/2013  . Knee sprain and strain 02/17/2013  . Trigger point with neck pain 03/18/2012  . Rotator cuff tear arthropathy of right shoulder 09/08/2011  . CAD in native artery 01/09/2011  . Ankle fracture 12/25/2010  . Contusion of shoulder, left 12/25/2010  . PEMPHIGUS VULGARIS 07/02/2010  . MRSA 05/13/2010  . PRURITUS 05/13/2010  . SPINAL STENOSIS, LUMBAR 05/13/2010  . HYPERLIPIDEMIA 04/24/2010  . GASTROESOPHAGEAL REFLUX DISEASE 04/24/2010  . VENTRAL HERNIA, INCISIONAL 04/24/2010  . DEGENERATIVE JOINT DISEASE 04/24/2010  . PYOGENIC ARTHRITIS, SHOULDER REGION 02/04/2010  . HYPERTENSION 01/01/2010  . RUPTURE ROTATOR CUFF 02/19/2009       GP    Bella Kennedyynthia J Russell 12/09/2013, 2:39 PM

## 2013-12-09 NOTE — Progress Notes (Signed)
Occupational Therapy Treatment Patient Details  Name: Jake MarvelRonald W Bartnick MRN: [id number] Date of Birth: 01/09/1941  Today's Date: 12/09/2013 Time: 1355-1440 OT Time Calculation (min): 45 min Manual 1355-1420 (25') Therapeutic Exercises 1420-1432 (12') Self Care 1432-1440 (8')  Visit#: 2 of 16  Re-eval: 01/02/14    Authorization: Medicare  Authorization Time Period: Before 10th visit  Authorization Visit#: 2 of 10  Subjective Symptoms/Limitations Symptoms: "Thye're just like always. I took a pian pill today." Pertinent History: Pt is 73 yo presenting to outpatient OT with bilateral upper extremity weakness.  He had back surgery in November with several complications and was in rehab at the [facility] for 2 months.  His rotator cuffs were weak prior to suegery, but with increased disuse since his surgery have become increasingly weaker.  Pt does not want to have rotator cuff repair surgery.    Of note, pt also stated that with previous surgery for the removal of his sternum the MDs took muscles form his arms to close his chest incision, resulting in decreased strength.      5/15 - Pt told OTR of left arthroscopic shoulder surgery in january. He stated he had no precautions after. Pain Assessment Currently in Pain?: No/denies  Precautions/Restrictions     Exercise/Treatments Supine Protraction: Both;PROM;AAROM;10 reps Horizontal ABduction: Both;PROM;AAROM;10 reps External Rotation: Both;PROM;AAROM;10 reps Internal Rotation: Both;PROM;AAROM;10 reps Flexion: Both;PROM;AAROM;10 reps ABduction: Both;PROM;AAROM;10 reps Seated Elevation: AROM;15 reps Extension: AROM;15 reps Retraction: AROM;15 reps      Manual Therapy Manual Therapy: Myofascial release Myofascial Release: Myofascial release (MFR) to bilateral upper arm, anterior and posterior shoulder regions to decrease fascial restrictions and promote improved, pain-free, range of motion.  Occupational Therapy Assessment and  Plan OT Assessment and Plan Clinical Impression Statement: Initiated supine PROM and AAROM this session.  Pt has decreased ROM and increased pain in left shoulder compared to right.  Pt had significanly decreased ROM with AAROM than PROM.  Toelrated well seated elevation/extension/row.  Eduacted pt on HEP with towel slides. OT Plan: Follow up on towel stretch HEP.  Add theraputty, and for HEP.  continue PROM, AAROM.  Add isometrics.   Goals Home Exercise Program Pt/caregiver will Perform Home Exercise Program: For increased ROM;For increased strengthening PT Goal: Perform Home Exercise Program - Progress: Goal set today Short Term Goals Short Term Goal 1: Pt will be educated on HEP Short Term Goal 1 Progress: Progressing toward goal Short Term Goal 2: Pt will achieve BUE shoulder AROM WFL in supine in working towards improving his golf swing. Short Term Goal 2 Progress: Progressing toward goal Short Term Goal 3: Pt will improve BUE strength to atlesat 4-/5 for improved ability to reach for with shaving supplies during morning ADLs. Short Term Goal 3 Progress: Progressing toward goal Short Term Goal 4: Pt will have decreased fascial restrictions to min-mod in BUE for decrased pain with motion. Short Term Goal 4 Progress: Progressing toward goal Short Term Goal 5: Pt will improve bilateral grip strength by atleast 5# each. Short Term Goal 5 Progress: Progressing toward goal Long Term Goals Long Term Goal 1: Pt will achieve highest level of functioning with all ADLs, IADLs, and leisure tasks when using BUE. Long Term Goal 1 Progress: Progressing toward goal Long Term Goal 2: Pt will achieve BUE shoulder AROM WFL in standing in working towards inmprving his golf swing. Long Term Goal 2 Progress: Progressing toward goal Long Term Goal 3: Pt will improve BUE shoulder strength to atleast 4/5 for improved ability to reach  for items off of shelves. Long Term Goal 3 Progress: Progressing toward  goal Long Term Goal 4: Pt will have decreased fascial restictions to trace-min in BUE. Long Term Goal 4 Progress: Progressing toward goal Long Term Goal 5: Pt will improve bilateral grip strength by atleast 10# each. Long Term Goal 5 Progress: Progressing toward goal Additional Long Term Goals?: Yes Long Term Goal 6: Pt will verbalize less than 1/10 pain in his shoulder with movement. Long Term Goal 6 Progress: Progressing toward goal  Problem List Patient Active Problem List   Diagnosis Date Noted  . Muscle weakness (generalized) 12/05/2013  . Tight fascia 12/05/2013  . Decreased range of motion of right shoulder 12/05/2013  . Decreased range of motion of left shoulder 12/05/2013  . Bilateral leg weakness 11/21/2013  . Poor balance 11/21/2013  . Difficulty in walking(719.7) 11/21/2013  . Shoulder pain, left 09/12/2013  . Anemia, iron deficiency 09/09/2013  . Dyspnea 09/07/2013  . UTI (urinary tract infection) 09/07/2013  . Acute CHF (congestive heart failure) 09/07/2013  . Elevated troponin 09/07/2013  . Fever and chills 09/06/2013  . Altered mental status 09/05/2013  . Neurogenic bowel 08/22/2013  . Cauda equina syndrome with neurogenic bladder 08/22/2013  . Gout flare 08/22/2013  . Postoperative wound infection 08/22/2013  . Lumbar degenerative disc disease 08/02/2013  . Post-operative pain 07/29/2013  . Osteonecrosis 05/05/2013  . Small bowel obstruction 04/17/2013  . Acute renal failure 04/17/2013  . Medial meniscus, posterior horn derangement 02/17/2013  . Knee sprain and strain 02/17/2013  . Trigger point with neck pain 03/18/2012  . Rotator cuff tear arthropathy of right shoulder 09/08/2011  . CAD in native artery 01/09/2011  . Ankle fracture 12/25/2010  . Contusion of shoulder, left 12/25/2010  . PEMPHIGUS VULGARIS 07/02/2010  . MRSA 05/13/2010  . PRURITUS 05/13/2010  . SPINAL STENOSIS, LUMBAR 05/13/2010  . HYPERLIPIDEMIA 04/24/2010  . GASTROESOPHAGEAL REFLUX  DISEASE 04/24/2010  . VENTRAL HERNIA, INCISIONAL 04/24/2010  . DEGENERATIVE JOINT DISEASE 04/24/2010  . PYOGENIC ARTHRITIS, SHOULDER REGION 02/04/2010  . HYPERTENSION 01/01/2010  . RUPTURE ROTATOR CUFF 02/19/2009    End of Session Activity Tolerance: Patient tolerated treatment well General Behavior During Therapy: [facility] tasks assessed/performed OT Plan of Care OT Home Exercise Plan: Towel slides  OT Patient Instructions: Explained and demonstrated to pt.  Pt verbalized understanding.  provided handout. Consulted and Agree with Plan of Care: Patient  GO Functional Assessment Tool Used: 5/15 DASH 54.54 (54% impaired)  Marry GuanMarie Rawlings, MS, OTR/L [phone]  12/09/2013, 3:03 PM

## 2013-12-12 ENCOUNTER — Ambulatory Visit (HOSPITAL_COMMUNITY)
Admission: RE | Admit: 2013-12-12 | Discharge: 2013-12-12 | Disposition: A | Discharge status: Home / Self Care | Payer: Medicare Other | Source: Ambulatory Visit | Attending: Pulmonary Disease | Admitting: Pulmonary Disease

## 2013-12-12 DIAGNOSIS — M6281 Muscle weakness (generalized): Secondary | ICD-10-CM

## 2013-12-12 DIAGNOSIS — IMO0001 Reserved for inherently not codable concepts without codable children: Secondary | ICD-10-CM | POA: Diagnosis not present

## 2013-12-12 DIAGNOSIS — M6289 Other specified disorders of muscle: Secondary | ICD-10-CM

## 2013-12-12 DIAGNOSIS — M25612 Stiffness of left shoulder, not elsewhere classified: Secondary | ICD-10-CM

## 2013-12-12 DIAGNOSIS — M25611 Stiffness of right shoulder, not elsewhere classified: Secondary | ICD-10-CM

## 2013-12-12 DIAGNOSIS — M629 Disorder of muscle, unspecified: Secondary | ICD-10-CM

## 2013-12-12 NOTE — Progress Notes (Signed)
Occupational Therapy Treatment Patient Details  Name: Jake MarvelRonald W Folds MRN: [id number] Date of Birth: 02/06/1941  Today's Date: 12/12/2013 Time: 7829-56211104-1145 OT Time Calculation (min): 41 min MFR [id number] 16' Therex [id number] 25'  Visit#: 3 of 16  Re-eval: 01/02/14    Authorization: Medicare  Authorization Time Period: Before 10th visit  Authorization Visit#: 3 of 10  Subjective Symptoms/Limitations Symptoms: S: My left arm is a little sore. That's probably the worst. Pain Assessment Currently in Pain?: No/denies  Precautions/Restrictions  Precautions Precautions: Back  Exercise/Treatments Supine Protraction: Both;PROM;AAROM;10 reps Horizontal ABduction: Both;PROM;AAROM;10 reps External Rotation: Both;PROM;AAROM;10 reps Internal Rotation: Both;PROM;AAROM;10 reps Flexion: Both;PROM;AAROM;10 reps ABduction: Both;PROM;AAROM;10 reps ROM / Strengthening / Isometric Strengthening   Flexion: 3X5" Extension: 3X5" External Rotation: 3X5" Internal Rotation: 3X5" ABduction: 3X5" ADduction: 3X5"    Manual Therapy Manual Therapy: Myofascial release Myofascial Release: Myofascial release (MFR) to bilateral upper arm, anterior and posterior shoulder regions to decrease fascial restrictions and promote improved, pain-free, range of motion  Occupational Therapy Assessment and Plan OT Assessment and Plan Clinical Impression Statement: A: Added isometric exercises. Pt tolerated well. Patient was given red theraputty and HEP this date. Reviewed exercises. Patient states he has not completed towel stretch HEP this past weekend as he was very busy. OT Plan: Follow up on towel stretch HEP and red theraputty. Increase isometrics to 3x10. Add thumb tacks, seated elevation, row, and extension.   Goals Short Term Goals Short Term Goal 1: Pt will be educated on HEP Short Term Goal 1 Progress: Progressing toward goal Short Term Goal 2: Pt will achieve BUE shoulder AROM WFL in supine in  working towards improving his golf swing. Short Term Goal 2 Progress: Progressing toward goal Short Term Goal 3: Pt will improve BUE strength to atlesat 4-/5 for improved ability to reach for with shaving supplies during morning ADLs. Short Term Goal 3 Progress: Progressing toward goal Short Term Goal 4: Pt will have decreased fascial restrictions to min-mod in BUE for decrased pain with motion. Short Term Goal 4 Progress: Progressing toward goal Short Term Goal 5: Pt will improve bilateral grip strength by atleast 5# each. Short Term Goal 5 Progress: Progressing toward goal Long Term Goals Long Term Goal 1: Pt will achieve highest level of functioning with all ADLs, IADLs, and leisure tasks when using BUE. Long Term Goal 1 Progress: Progressing toward goal Long Term Goal 2: Pt will achieve BUE shoulder AROM WFL in standing in working towards inmprving his golf swing. Long Term Goal 2 Progress: Progressing toward goal Long Term Goal 3: Pt will improve BUE shoulder strength to atleast 4/5 for improved ability to reach for items off of shelves. Long Term Goal 3 Progress: Progressing toward goal Long Term Goal 4: Pt will have decreased fascial restictions to trace-min in BUE. Long Term Goal 4 Progress: Progressing toward goal Long Term Goal 5: Pt will improve bilateral grip strength by atleast 10# each. Long Term Goal 5 Progress: Progressing toward goal Additional Long Term Goals?: Yes Long Term Goal 6: Pt will verbalize less than 1/10 pain in his shoulder with movement. Long Term Goal 6 Progress: Progressing toward goal  Problem List Patient Active Problem List   Diagnosis Date Noted  . Muscle weakness (generalized) 12/05/2013  . Tight fascia 12/05/2013  . Decreased range of motion of right shoulder 12/05/2013  . Decreased range of motion of left shoulder 12/05/2013  . Bilateral leg weakness 11/21/2013  . Poor balance 11/21/2013  . Difficulty in walking(719.7) 11/21/2013  . Shoulder  pain, left 09/12/2013  . Anemia, iron deficiency 09/09/2013  . Dyspnea 09/07/2013  . UTI (urinary tract infection) 09/07/2013  . Acute CHF (congestive heart failure) 09/07/2013  . Elevated troponin 09/07/2013  . Fever and chills 09/06/2013  . Altered mental status 09/05/2013  . Neurogenic bowel 08/22/2013  . Cauda equina syndrome with neurogenic bladder 08/22/2013  . Gout flare 08/22/2013  . Postoperative wound infection 08/22/2013  . Lumbar degenerative disc disease 08/02/2013  . Post-operative pain 07/29/2013  . Osteonecrosis 05/05/2013  . Small bowel obstruction 04/17/2013  . Acute renal failure 04/17/2013  . Medial meniscus, posterior horn derangement 02/17/2013  . Knee sprain and strain 02/17/2013  . Trigger point with neck pain 03/18/2012  . Rotator cuff tear arthropathy of right shoulder 09/08/2011  . CAD in native artery 01/09/2011  . Ankle fracture 12/25/2010  . Contusion of shoulder, left 12/25/2010  . PEMPHIGUS VULGARIS 07/02/2010  . MRSA 05/13/2010  . PRURITUS 05/13/2010  . SPINAL STENOSIS, LUMBAR 05/13/2010  . HYPERLIPIDEMIA 04/24/2010  . GASTROESOPHAGEAL REFLUX DISEASE 04/24/2010  . VENTRAL HERNIA, INCISIONAL 04/24/2010  . DEGENERATIVE JOINT DISEASE 04/24/2010  . PYOGENIC ARTHRITIS, SHOULDER REGION 02/04/2010  . HYPERTENSION 01/01/2010  . RUPTURE ROTATOR CUFF 02/19/2009    End of Session Activity Tolerance: Patient tolerated treatment well General Behavior During Therapy: [facility]FL for tasks assessed/performed OT Plan of Care OT Home Exercise Plan:  red theraputty HEP OT Patient Instructions: handout (scanned) Consulted and Agree with Plan of Care: Patient   Limmie PatriciaLaura Ronnetta Currington, OTR/L,CBIS   12/12/2013, 11:57 AM

## 2013-12-12 NOTE — Progress Notes (Signed)
Physical Therapy Treatment Patient Details  Name: Jake MarvelRonald W Spindle MRN: [id number] Date of Birth: 01/01/1941  Today's Date: 12/12/2013 Time: 1015-1100 PT Time Calculation (min): 45 min  Visit#: 9 of 12  Re-eval: 12/21/13 Authorization: Medicare  Authorization Visit#: 9 of 10  Charges:  therex 42  Subjective: Pt states he had a busy weekend.  States he thinks he's having an allergic reaction to one of his meds.  States he is itching and broken out on the inside of his Lt UE.  Advised patient to contact MD regarding this.  Currently without pain other than a little back discomfort.   Exercise/Treatments Aerobic Stationary Bike: NuStep x10' L5 hills #3 to improve strength and activity tolerance Standing Forward Lunges: Limitations Forward Lunges Limitations: 10 reps;Step Height: 8";Both Other Standing Knee Exercises: Sit to stand 4x 5 with min assistance no UE's, Marching to 8" box 10x2 sets, Side stepping in front of large mats 2RT  Physical Therapy Assessment and Plan PT Assessment and Plan Clinical Impression Statement: Pt with increased confidence today with sidestepping, however noted weakness and instabilitiy.  Verbal cues required for erect posture.  Able to complete sit to stands today with less assistance and increase to 8" box wtih toe tap/marching activity.  Pt overall progressing with increasing function. PT Plan: Continue to improve dynamic stability, LE strength, activity tolerance and independence with gait.    Problem List Patient Active Problem List   Diagnosis Date Noted  . Muscle weakness (generalized) 12/05/2013  . Tight fascia 12/05/2013  . Decreased range of motion of right shoulder 12/05/2013  . Decreased range of motion of left shoulder 12/05/2013  . Bilateral leg weakness 11/21/2013  . Poor balance 11/21/2013  . Difficulty in walking(719.7) 11/21/2013  . Shoulder pain, left 09/12/2013  . Anemia, iron deficiency 09/09/2013  . Dyspnea 09/07/2013  . UTI  (urinary tract infection) 09/07/2013  . Acute CHF (congestive heart failure) 09/07/2013  . Elevated troponin 09/07/2013  . Fever and chills 09/06/2013  . Altered mental status 09/05/2013  . Neurogenic bowel 08/22/2013  . Cauda equina syndrome with neurogenic bladder 08/22/2013  . Gout flare 08/22/2013  . Postoperative wound infection 08/22/2013  . Lumbar degenerative disc disease 08/02/2013  . Post-operative pain 07/29/2013  . Osteonecrosis 05/05/2013  . Small bowel obstruction 04/17/2013  . Acute renal failure 04/17/2013  . Medial meniscus, posterior horn derangement 02/17/2013  . Knee sprain and strain 02/17/2013  . Trigger point with neck pain 03/18/2012  . Rotator cuff tear arthropathy of right shoulder 09/08/2011  . CAD in native artery 01/09/2011  . Ankle fracture 12/25/2010  . Contusion of shoulder, left 12/25/2010  . PEMPHIGUS VULGARIS 07/02/2010  . MRSA 05/13/2010  . PRURITUS 05/13/2010  . SPINAL STENOSIS, LUMBAR 05/13/2010  . HYPERLIPIDEMIA 04/24/2010  . GASTROESOPHAGEAL REFLUX DISEASE 04/24/2010  . VENTRAL HERNIA, INCISIONAL 04/24/2010  . DEGENERATIVE JOINT DISEASE 04/24/2010  . PYOGENIC ARTHRITIS, SHOULDER REGION 02/04/2010  . HYPERTENSION 01/01/2010  . RUPTURE ROTATOR CUFF 02/19/2009      Lurena NidaAmy B Frazier, PTA/CLT 12/12/2013, 11:45 AM

## 2013-12-14 ENCOUNTER — Ambulatory Visit (HOSPITAL_COMMUNITY)
Admission: RE | Admit: 2013-12-14 | Discharge: 2013-12-14 | Disposition: A | Discharge status: Home / Self Care | Payer: Medicare Other | Source: Ambulatory Visit

## 2013-12-14 ENCOUNTER — Ambulatory Visit (HOSPITAL_COMMUNITY)
Admission: RE | Admit: 2013-12-14 | Discharge: 2013-12-14 | Disposition: A | Discharge status: Home / Self Care | Payer: Medicare Other | Source: Ambulatory Visit | Attending: Pulmonary Disease | Admitting: Pulmonary Disease

## 2013-12-14 DIAGNOSIS — IMO0001 Reserved for inherently not codable concepts without codable children: Secondary | ICD-10-CM | POA: Diagnosis not present

## 2013-12-14 NOTE — Progress Notes (Addendum)
Physical Therapy Treatment Patient Details  Name: [name] MRN: [id number] Date of Birth: 04/19/1941  Today's Date: 12/14/2013 Time: 1105-1145 PT Time Calculation (min): 40 min Charges: NMR x 38'  Visit#: 10 of 12  Re-eval: 12/21/13  Authorization: Medicare  Authorization Time Period:    Authorization Visit#: 10 of 10   Subjective: Symptoms/Limitations Symptoms: Pt states that he had increased pain this morning secondary to going to the gym yesterday. Pain has since decreased.  Pain Assessment Currently in Pain?: Yes Pain Score: 2  Pain Location: Back Pain Orientation: Right;Left  Exercise/Treatments Balance Exercises Standing Sidestepping: Limitations Sidestepping Limitations: 1RT at mat table with 1 HHA Sit to Stand: Standard surface;Limitations Sit to Stand Limitations: Sit to stand x 10 standing as long as possible with 1 HHA (1'30" max)  Other Standing Exercises: wt shifting side to side as well as forward and back    Physical Therapy Assessment and Plan PT Assessment and Plan Clinical Impression Statement: PTA facilitated activities to improve functional strength, standing tolerance and balance. Pt displays improved standing tolerance this session. Pt requires HHA for all standing activities. Attempted recumbent bike as NuStep was already use. Pt unable to keep feet on pedals due to LE weakness. Pt tolerates session well and appears to be progressing well toward all goals. PT Plan: Continue to improve dynamic stability, LE strength, activity tolerance and independence with gait.    Problem List Patient Active Problem List   Diagnosis Date Noted  . Muscle weakness (generalized) 12/05/2013  . Tight fascia 12/05/2013  . Decreased range of motion of right shoulder 12/05/2013  . Decreased range of motion of left shoulder 12/05/2013  . Bilateral leg weakness 11/21/2013  . Poor balance 11/21/2013  . Difficulty in walking(719.7) 11/21/2013  . Shoulder pain, left  09/12/2013  . Anemia, iron deficiency 09/09/2013  . Dyspnea 09/07/2013  . UTI (urinary tract infection) 09/07/2013  . Acute CHF (congestive heart failure) 09/07/2013  . Elevated troponin 09/07/2013  . Fever and chills 09/06/2013  . Altered mental status 09/05/2013  . Neurogenic bowel 08/22/2013  . Cauda equina syndrome with neurogenic bladder 08/22/2013  . Gout flare 08/22/2013  . Postoperative wound infection 08/22/2013  . Lumbar degenerative disc disease 08/02/2013  . Post-operative pain 07/29/2013  . Osteonecrosis 05/05/2013  . Small bowel obstruction 04/17/2013  . Acute renal failure 04/17/2013  . Medial meniscus, posterior horn derangement 02/17/2013  . Knee sprain and strain 02/17/2013  . Trigger point with neck pain 03/18/2012  . Rotator cuff tear arthropathy of right shoulder 09/08/2011  . CAD in native artery 01/09/2011  . Ankle fracture 12/25/2010  . Contusion of shoulder, left 12/25/2010  . PEMPHIGUS VULGARIS 07/02/2010  . MRSA 05/13/2010  . PRURITUS 05/13/2010  . SPINAL STENOSIS, LUMBAR 05/13/2010  . HYPERLIPIDEMIA 04/24/2010  . GASTROESOPHAGEAL REFLUX DISEASE 04/24/2010  . VENTRAL HERNIA, INCISIONAL 04/24/2010  . DEGENERATIVE JOINT DISEASE 04/24/2010  . PYOGENIC ARTHRITIS, SHOULDER REGION 02/04/2010  . HYPERTENSION 01/01/2010  . RUPTURE ROTATOR CUFF 02/19/2009    PT - End of Session Activity Tolerance: Patient tolerated treatment well General Behavior During Therapy: WFL for tasks assessed/performed  GP  Functional Assessment Tool Used: foto 37 (was 35 on 11/21/13) Functional Limitation: Mobility: Walking and moving around  Mobility: Walking and Moving Around Current Status [phone]([id number]): At least 60 percent but less than 80 percent impaired, limited or restricted  Mobility: Walking and Moving Around Goal Status [phone]([id number]): At least 40 percent but less than 60 percent impaired, limited or restricted  [name], PTA  12/14/2013, 12:05 PM

## 2013-12-14 NOTE — Progress Notes (Signed)
Occupational Therapy Treatment Patient Details  Name: [name] MRN: [id number] Date of Birth: 12/25/1940  Today's Date: 12/14/2013 Time: 1011-1100 OT Time Calculation (min): 49 min MFR 1011-1130 13' Therex 1130-1100 36'  Visit#: 4 of 16  Re-eval: 01/02/14    Authorization: Medicare  Authorization Time Period: Before 10th visit  Authorization Visit#: 4 of 10  Subjective Symptoms/Limitations Symptoms: S: I went to workout yesterday. I did some arms and legs exercises. It felt good. Pain Assessment Currently in Pain?: Yes Pain Score: 7  Pain Location: Back Pain Type: Chronic pain  Precautions/Restrictions  Precautions Precautions: Back  Exercise/Treatments Supine Protraction: Both;PROM;AAROM;10 reps Horizontal ABduction: Both;PROM;AAROM;10 reps External Rotation: Both;PROM;AAROM;10 reps Internal Rotation: Both;PROM;AAROM;10 reps Flexion: Both;PROM;AAROM;10 reps ABduction: Both;PROM;AAROM;10 reps Seated Elevation: AROM;15 reps ROM / Strengthening / Isometric Strengthening Thumb Tacks: 1' Each arm individually with mod support from therapist Proximal Shoulder Strengthening, Supine: 10X each arm individually. Active assist required on left arm. Prot/Ret//Elev/Dep: 1' mod assist given to left arm for support Flexion:  (3x10) Extension:  (3x10) External Rotation:  (3x10) Internal Rotation:  (3x10) ABduction:  (3x10) ADduction:  (3x10)    Manual Therapy Manual Therapy: Myofascial release Myofascial Release: Myofascial release (MFR) to bilateral upper arm, anterior and posterior shoulder regions to decrease fascial restrictions and promote improved, pain-free, range of motion  Occupational Therapy Assessment and Plan OT Assessment and Plan Clinical Impression Statement: A: Increased isometric hold count, added thumb tacks, and pro/ret/elev/dep. patient tolerated well with increased fatigue. Required some support for arms with new exercises. Patient states that  he did not complete stowel stretches or theraputty exercises that were given last session.  OT Plan: Follow up on towel stretch HEP and red theraputty. Increase isometrics to 5x10   Goals Short Term Goals Short Term Goal 1: Pt will be educated on HEP Short Term Goal 2: Pt will achieve BUE shoulder AROM WFL in supine in working towards improving his golf swing. Short Term Goal 3: Pt will improve BUE strength to atlesat 4-/5 for improved ability to reach for with shaving supplies during morning ADLs. Short Term Goal 4: Pt will have decreased fascial restrictions to min-mod in BUE for decrased pain with motion. Short Term Goal 5: Pt will improve bilateral grip strength by atleast 5# each. Long Term Goals Long Term Goal 1: Pt will achieve highest level of functioning with all ADLs, IADLs, and leisure tasks when using BUE. Long Term Goal 2: Pt will achieve BUE shoulder AROM WFL in standing in working towards inmprving his golf swing. Long Term Goal 3: Pt will improve BUE shoulder strength to atleast 4/5 for improved ability to reach for items off of shelves. Long Term Goal 4: Pt will have decreased fascial restictions to trace-min in BUE. Long Term Goal 5: Pt will improve bilateral grip strength by atleast 10# each. Additional Long Term Goals?: Yes Long Term Goal 6: Pt will verbalize less than 1/10 pain in his shoulder with movement.  Problem List Patient Active Problem List   Diagnosis Date Noted  . Muscle weakness (generalized) 12/05/2013  . Tight fascia 12/05/2013  . Decreased range of motion of right shoulder 12/05/2013  . Decreased range of motion of left shoulder 12/05/2013  . Bilateral leg weakness 11/21/2013  . Poor balance 11/21/2013  . Difficulty in walking(719.7) 11/21/2013  . Shoulder pain, left 09/12/2013  . Anemia, iron deficiency 09/09/2013  . Dyspnea 09/07/2013  . UTI (urinary tract infection) 09/07/2013  . Acute CHF (congestive heart failure) 09/07/2013  . Elevated  troponin 09/07/2013  . Fever and chills 09/06/2013  . Altered mental status 09/05/2013  . Neurogenic bowel 08/22/2013  . Cauda equina syndrome with neurogenic bladder 08/22/2013  . Gout flare 08/22/2013  . Postoperative wound infection 08/22/2013  . Lumbar degenerative disc disease 08/02/2013  . Post-operative pain 07/29/2013  . Osteonecrosis 05/05/2013  . Small bowel obstruction 04/17/2013  . Acute renal failure 04/17/2013  . Medial meniscus, posterior horn derangement 02/17/2013  . Knee sprain and strain 02/17/2013  . Trigger point with neck pain 03/18/2012  . Rotator cuff tear arthropathy of right shoulder 09/08/2011  . CAD in native artery 01/09/2011  . Ankle fracture 12/25/2010  . Contusion of shoulder, left 12/25/2010  . PEMPHIGUS VULGARIS 07/02/2010  . MRSA 05/13/2010  . PRURITUS 05/13/2010  . SPINAL STENOSIS, LUMBAR 05/13/2010  . HYPERLIPIDEMIA 04/24/2010  . GASTROESOPHAGEAL REFLUX DISEASE 04/24/2010  . VENTRAL HERNIA, INCISIONAL 04/24/2010  . DEGENERATIVE JOINT DISEASE 04/24/2010  . PYOGENIC ARTHRITIS, SHOULDER REGION 02/04/2010  . HYPERTENSION 01/01/2010  . RUPTURE ROTATOR CUFF 02/19/2009    End of Session Activity Tolerance: Patient tolerated treatment well General Behavior During Therapy: [facility] for tasks assessed/performed   Ailene Ravel, OTR/L,CBIS   12/14/2013, 11:51 AM

## 2013-12-16 ENCOUNTER — Ambulatory Visit (HOSPITAL_COMMUNITY)
Admission: RE | Admit: 2013-12-16 | Discharge: 2013-12-16 | Disposition: A | Discharge status: Home / Self Care | Payer: Medicare Other | Source: Ambulatory Visit | Attending: Pulmonary Disease | Admitting: Pulmonary Disease

## 2013-12-16 DIAGNOSIS — R29898 Other symptoms and signs involving the musculoskeletal system: Secondary | ICD-10-CM

## 2013-12-16 DIAGNOSIS — IMO0001 Reserved for inherently not codable concepts without codable children: Secondary | ICD-10-CM | POA: Diagnosis not present

## 2013-12-16 DIAGNOSIS — R262 Difficulty in walking, not elsewhere classified: Secondary | ICD-10-CM

## 2013-12-16 DIAGNOSIS — R2689 Other abnormalities of gait and mobility: Secondary | ICD-10-CM

## 2013-12-16 NOTE — Progress Notes (Signed)
Physical Therapy Treatment Patient Details  Name: [name] MRN: [id number] Date of Birth: 1941-07-16  Today's Date: 12/16/2013 Time: 1104-1200 PT Time Calculation (min): 56 min Charge: TE 1104-1200  Visit#: 11 of 12  Re-eval: 12/21/13 Assessment Diagnosis: HNP Next MD Visit: Dr Juanetta Gosling 12/27/2013  Authorization: Medicare  Authorization Time Period:    Authorization Visit#: 11 of 20   Subjective: Symptoms/Limitations Symptoms: Pain free today, feeling good Pain Assessment Currently in Pain?: No/denies  Precautions/Restrictions  Precautions Precautions: Back  Exercise/Treatments Aerobic Stationary Bike: NuStep x10' L5 hills #4 to improve strength and activity tolerance Machines for Strengthening Cybex Leg Press: 8 pl focus of keeping knees apart 15x Standing Functional Squat: 15 reps;Limitations;2 sets Functional Squat Limitations: frontal and sagital planes Other Standing Knee Exercises: split stance weight shift frontal plane and sagittal plane 10x, 3D hip excursions 10x Other Standing Knee Exercises: STS 10 reps no HHA from chair height with airex, min assistance; static standing 6 reps max 1'10" with SBA Balance Exercises Standing Sidestepping: Limitations Sidestepping Limitations: 3RT inside // bars Other Standing Exercises: wt shifting side to side as well as forward and back    Physical Therapy Assessment and Plan PT Assessment and Plan Clinical Impression Statement: Session focus on improving functional strenthening to improve functional tasks like sit to stand, standing tolerance and balance activtieis.  Pt able to static stand for 1'10" today following sit to stand activities.  Min assistance required with STS with cueing to increase trunk flexion and to reduce adduction with standing.  Pt limited by fatigue end of session, no reports of pain.  Added leg press for strengthening with cueing to reduce adduction due to weak glut med. PT Plan: Continue to  improve dynamic stability, LE strength, activity tolerance and independence with gait.  Next session begin sidelying adduction and clams     Goals PT Short Term Goals PT Short Term Goal 1: Pt to increase Berg balace by 5 to decrease risk of falling PT Short Term Goal 1 - Progress: Progressing toward goal PT Short Term Goal 2: Pt to be walking for 10 mintues with rolling walker PT Short Term Goal 2 - Progress: Progressing toward goal PT Short Term Goal 3: Pt to be able to stand for a minute with no assistive device to allow easier grooming  PT Short Term Goal 3 - Progress: Progressing toward goal PT Long Term Goals PT Long Term Goal 1: Berg balance to be increased by 10 to allow decreased risk of falling PT Long Term Goal 2: Pt to be ambulating with walker x 30 minutes Long Term Goal 3: Pt to be able to stand without assistive device for five minutes to ease grooming Long Term Goal 4: Pt to be able to stand with walker for 15 minutes for socializing . PT Long Term Goal 5: strength of LE improved one grade to allow the above to occur  Problem List Patient Active Problem List   Diagnosis Date Noted  . Muscle weakness (generalized) 12/05/2013  . Tight fascia 12/05/2013  . Decreased range of motion of right shoulder 12/05/2013  . Decreased range of motion of left shoulder 12/05/2013  . Bilateral leg weakness 11/21/2013  . Poor balance 11/21/2013  . Difficulty in walking(719.7) 11/21/2013  . Shoulder pain, left 09/12/2013  . Anemia, iron deficiency 09/09/2013  . Dyspnea 09/07/2013  . UTI (urinary tract infection) 09/07/2013  . Acute CHF (congestive heart failure) 09/07/2013  . Elevated troponin 09/07/2013  . Fever and chills 09/06/2013  .  Altered mental status 09/05/2013  . Neurogenic bowel 08/22/2013  . Cauda equina syndrome with neurogenic bladder 08/22/2013  . Gout flare 08/22/2013  . Postoperative wound infection 08/22/2013  . Lumbar degenerative disc disease 08/02/2013  .  Post-operative pain 07/29/2013  . Osteonecrosis 05/05/2013  . Small bowel obstruction 04/17/2013  . Acute renal failure 04/17/2013  . Medial meniscus, posterior horn derangement 02/17/2013  . Knee sprain and strain 02/17/2013  . Trigger point with neck pain 03/18/2012  . Rotator cuff tear arthropathy of right shoulder 09/08/2011  . CAD in native artery 01/09/2011  . Ankle fracture 12/25/2010  . Contusion of shoulder, left 12/25/2010  . PEMPHIGUS VULGARIS 07/02/2010  . MRSA 05/13/2010  . PRURITUS 05/13/2010  . SPINAL STENOSIS, LUMBAR 05/13/2010  . HYPERLIPIDEMIA 04/24/2010  . GASTROESOPHAGEAL REFLUX DISEASE 04/24/2010  . VENTRAL HERNIA, INCISIONAL 04/24/2010  . DEGENERATIVE JOINT DISEASE 04/24/2010  . PYOGENIC ARTHRITIS, SHOULDER REGION 02/04/2010  . HYPERTENSION 01/01/2010  . RUPTURE ROTATOR CUFF 02/19/2009    PT - End of Session Equipment Utilized During Treatment: Gait belt Activity Tolerance: Patient limited by fatigue;Patient tolerated treatment well General Behavior During Therapy: [facility] for tasks assessed/performed  GP    [name] 12/16/2013, 12:13 PM

## 2013-12-20 DIAGNOSIS — J45909 Unspecified asthma, uncomplicated: Secondary | ICD-10-CM | POA: Diagnosis not present

## 2013-12-20 DIAGNOSIS — I779 Disorder of arteries and arterioles, unspecified: Secondary | ICD-10-CM | POA: Diagnosis not present

## 2013-12-20 DIAGNOSIS — L299 Pruritus, unspecified: Secondary | ICD-10-CM | POA: Diagnosis not present

## 2013-12-21 ENCOUNTER — Ambulatory Visit (HOSPITAL_COMMUNITY): Payer: Medicare Other

## 2013-12-21 ENCOUNTER — Ambulatory Visit (HOSPITAL_COMMUNITY)
Admission: RE | Admit: 2013-12-21 | Discharge: 2013-12-21 | Disposition: A | Discharge status: Home / Self Care | Payer: Medicare Other | Source: Ambulatory Visit | Attending: Pulmonary Disease | Admitting: Pulmonary Disease

## 2013-12-21 DIAGNOSIS — R2689 Other abnormalities of gait and mobility: Secondary | ICD-10-CM

## 2013-12-21 DIAGNOSIS — R29898 Other symptoms and signs involving the musculoskeletal system: Secondary | ICD-10-CM

## 2013-12-21 DIAGNOSIS — R262 Difficulty in walking, not elsewhere classified: Secondary | ICD-10-CM

## 2013-12-21 DIAGNOSIS — IMO0001 Reserved for inherently not codable concepts without codable children: Secondary | ICD-10-CM | POA: Diagnosis not present

## 2013-12-21 NOTE — Progress Notes (Signed)
Physical Therapy Re-evaluation/Treatment note  Patient Details  Name: [name] MRN: [id number] Date of Birth: 17-Oct-1940  Today's Date: 12/21/2013 Time: 1352-1430 PT Time Calculation (min): 38 min Charge: Physical performance testing 1352-1415, MMT 1415-1420, TE 1420-1430              Visit#: 12 of 24  Re-eval: 01/18/14 Assessment Diagnosis: HNP Next MD Visit: [name] 12/27/2013  Authorization: Medicare    Authorization Time Period:    Authorization Visit#: 12 of 20   Subjective Symptoms/Limitations Symptoms: Pt reported LBP 4-5/10.   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 He states that he had his strenum removed and then they used mm from his right and left arm for flaps.   How long can you sit comfortably?: Has been sitting for several hours, does not like to sit around much (Pt states he is not comfortable sitting and begins to adjust himself after about five minutes. ) How long can you stand comfortably?: Pt is able to stand for three seconds without a walker due to balance issues; with a walker three minutes  How long can you walk comfortably?: Pt walking into nursing home and cookout 4-5 minutes (was able to walk with the walker for less than three minutes) Pain Assessment Currently in Pain?: Yes Pain Score: 4  Pain Location: Back Pain Orientation: Lower Pain Type: Chronic pain  Precautions/Restrictions  Precautions Precautions: Back  Objective:   Assessment RLE Strength Right Hip Flexion: 4/5 (was 5) Right Hip Extension: 2/5 (was 2/5) Right Hip ABduction: 2+/5 (was 2-/5) Right Knee Flexion: 2/5 (was 2/5) Right Knee Extension: 5/5 (was 5/5) Right Ankle Dorsiflexion: 3+/5 (was 3/5) LLE AROM (degrees) Left Knee Extension:  (was 10) LLE Strength Left Hip Flexion: 4/5 (was 3-/5) Left Hip Extension: 2/5 (was 1/5) Left Hip ABduction: 2/5 (was 2-/5) Left Knee Flexion: 2/5 (was 2-/5) Left Knee  Extension: 5/5 (was 5/5) Left Ankle Dorsiflexion: 3/5 (was 2-/5)  Exercise/Treatments Mobility/Balance  Berg Balance Test Sit to Stand: Able to stand  independently using hands (was 3) Standing Unsupported: Needs several tries to stand 30 seconds unsupported (was 0) Sitting with Back Unsupported but Feet Supported on Floor or Stool: Able to sit safely and securely 2 minutes (was 4) Stand to Sit: Controls descent by using hands (was 2) Transfers: Able to transfer safely, definite need of hands (was 3) Standing Unsupported with Eyes Closed: Able to stand 3 seconds (was 0) Standing Ubsupported with Feet Together: Needs help to attain position and unable to hold for 15 seconds (was 0) From Standing, Reach Forward with Outstretched Arm: Can reach forward >5 cm safely (2") (was 2) From Standing Position, Pick up Object from Floor: Unable to try/needs assist to keep balance (was 0) From Standing Position, Turn to Look Behind Over each Shoulder: Needs supervision when turning (was 0) Turn 360 Degrees: Needs assistance while turning (was0) Standing Unsupported, Alternately Place Feet on Step/Stool: Needs assistance to keep from falling or unable to try (was 0) Standing Unsupported, One Foot in Front: Loses balance while stepping or standing (was 0) Standing on One Leg: Unable to try or needs assist to prevent fall (was 0) Total Score: 19    12/21/13 1400  Knee/Hip Exercises: Standing  Other Standing Knee Exercises split stance weight shift frontal plane and sagittal plane 10x, 3D hip excursions 10x  Other Standing Knee Exercises STS 10 reps no HHA from chair height with airex, min assistance; static  standing 6 reps max 1'10" with SBA  Knee/Hip Exercises: Supine  Bridges 15 reps  Knee/Hip Exercises: Prone  Hip Extension Limitations  Hip Extension Limitations glut sets 10x   Physical Therapy Assessment and Plan PT Assessment and Plan Clinical Impression Statement: Re-assessment complete with  the following findings:  Pt stated he has increased his activity tolerance with increased walking for short time up to 5 minutes.  Improved BERG balance test by 5 points with score 19/56 which indicates high risk of falls without AD.  Pt able to stand without HHA with several attempts with max time for standing without UE assistance for $RemoveBefore'1\' 10"'emMrmEdsAqzgY$  max time with supervision.  Strength is improving slowly, pt encouraged to increase frequency with bed exercises for hip and gluteal strengthening and to continue working on increased gait time.  Recommend continuing OPPT for 3x a week for 4 more weeksfor strength, balance and gait training to reach goals.   PT Plan: Recommend continuing OPPT 3x week for 4 more weeks to improve balance, strength, and gait.  Continue with nustep for activity tolerance and mat activtiies for LE strengthening.  Progress to standings and gait as able.      Goals PT Short Term Goals PT Short Term Goal 1: Pt to increase Berg balace by 5 to decrease risk of falling PT Short Term Goal 1 - Progress: Met PT Short Term Goal 2: Pt to be walking for 10 mintues with rolling walker PT Short Term Goal 2 - Progress: Progressing toward goal PT Short Term Goal 3: Pt to be able to stand for a minute with no assistive device to allow easier grooming  PT Short Term Goal 3 - Progress: Partly met ($RemoveBef'1\' 15"'GcARsBoNVL$  max with supervision) PT Long Term Goals PT Long Term Goal 1: Berg balance to be increased by 10 to allow decreased risk of falling PT Long Term Goal 1 - Progress: Progressing toward goal PT Long Term Goal 2: Pt to be ambulating with walker x 30 minutes PT Long Term Goal 2 - Progress: Progressing toward goal Long Term Goal 3: Pt to be able to stand without assistive device for five minutes to ease grooming Long Term Goal 3 Progress: Progressing toward goal Long Term Goal 4: Pt to be able to stand with walker for 15 minutes for socializing . Long Term Goal 4 Progress: Progressing toward goal PT  Long Term Goal 5: strength of LE improved one grade to allow the above to occur Long Term Goal 5 Progress: Other (comment)  Problem List Patient Active Problem List   Diagnosis Date Noted  . Muscle weakness (generalized) 12/05/2013  . Tight fascia 12/05/2013  . Decreased range of motion of right shoulder 12/05/2013  . Decreased range of motion of left shoulder 12/05/2013  . Bilateral leg weakness 11/21/2013  . Poor balance 11/21/2013  . Difficulty in walking(719.7) 11/21/2013  . Shoulder pain, left 09/12/2013  . Anemia, iron deficiency 09/09/2013  . Dyspnea 09/07/2013  . UTI (urinary tract infection) 09/07/2013  . Acute CHF (congestive heart failure) 09/07/2013  . Elevated troponin 09/07/2013  . Fever and chills 09/06/2013  . Altered mental status 09/05/2013  . Neurogenic bowel 08/22/2013  . Cauda equina syndrome with neurogenic bladder 08/22/2013  . Gout flare 08/22/2013  . Postoperative wound infection 08/22/2013  . Lumbar degenerative disc disease 08/02/2013  . Post-operative pain 07/29/2013  . Osteonecrosis 05/05/2013  . Small bowel obstruction 04/17/2013  . Acute renal failure 04/17/2013  . Medial meniscus, posterior horn derangement 02/17/2013  .  Knee sprain and strain 02/17/2013  . Trigger point with neck pain 03/18/2012  . Rotator cuff tear arthropathy of right shoulder 09/08/2011  . CAD in native artery 01/09/2011  . Ankle fracture 12/25/2010  . Contusion of shoulder, left 12/25/2010  . PEMPHIGUS VULGARIS 07/02/2010  . MRSA 05/13/2010  . PRURITUS 05/13/2010  . SPINAL STENOSIS, LUMBAR 05/13/2010  . HYPERLIPIDEMIA 04/24/2010  . GASTROESOPHAGEAL REFLUX DISEASE 04/24/2010  . VENTRAL HERNIA, INCISIONAL 04/24/2010  . DEGENERATIVE JOINT DISEASE 04/24/2010  . PYOGENIC ARTHRITIS, SHOULDER REGION 02/04/2010  . HYPERTENSION 01/01/2010  . RUPTURE ROTATOR CUFF 02/19/2009    PT - End of Session Equipment Utilized During Treatment: Gait belt Activity Tolerance: Patient  limited by fatigue;Patient tolerated treatment well General Behavior During Therapy: [facility] for tasks assessed/performed  GP    Aldona Lento 12/21/2013, 3:33 PM  Physician Documentation Your signature is required to indicate approval of the treatment plan as stated above.  Please sign and either send electronically or make a copy of this report for your files and return this physician signed original.   Please mark one 1.__approve of plan  2. ___approve of plan with the following conditions.   ______________________________                                                          _____________________ Physician Signature                                                                                                             Date

## 2013-12-21 NOTE — Progress Notes (Signed)
Occupational Therapy Treatment Patient Details  Name: [name] MRN: [id number] Date of Birth: 03/29/1941  Today's Date: 12/21/2013 Time: 7741-4239 OT Time Calculation (min): 52 min Manual 1430-1450 20' Therapeutic Exercises [phone] (73')  Visit#: 5 of 16  Re-eval: 01/02/14    Authorization: Medicare  Authorization Time Period: Before 10th visit  Authorization Visit#: 5 of 10  Subjective Symptoms/Limitations Symptoms: "My back's sore. I worked with my putty yesterday." Pain Assessment Currently in Pain?: Yes Pain Score: 4  Pain Location: Back Pain Orientation: Lower Pain Type: Chronic pain  Precautions/Restrictions  Precautions Precautions: Back  Exercise/Treatments Supine Protraction: Both;PROM;AAROM;10 reps;12 reps Horizontal ABduction: Both;PROM;10 reps;AAROM;12 reps External Rotation: Both;PROM;10 reps;AAROM;12 reps Internal Rotation: Both;PROM;10 reps;AAROM;12 reps Flexion: PROM;10 reps;AAROM (mod faciliation at left elbow.) ABduction: Both;PROM;10 reps;AAROM;12 reps Seated  ROM / Strengthening / Isometric Strengthening Thumb Tacks: 55" each arm (1 min goal) with mod support  Prot/Ret//Elev/Dep: 1' mod assist given to left arm for support Flexion: 5X10" (both) Extension: 5X10" (both) External Rotation: 5X10" (both) Internal Rotation: 5X10" (both) ABduction: 5X10" (both) ADduction: 5X10" (both)    Manual Therapy Manual Therapy: Myofascial release Myofascial Release: Myofascial release (MFR) to bilateral upper arm, anterior and posterior shoulder regions to decrease fascial restrictions and promote improved, pain-free, range of motion.    Occupational Therapy Assessment and Plan OT Assessment and Plan Clinical Impression Statement: Pt had increased fatigue during supine AAROM this session - required fingertip assist during abduction and mod assist at elbow for flexion.  Increased isometric hold time with good tolerance.   Pt has used theraputty  since last session, but not towel slides (due to kitchen table being covered) OT Plan: Continue supine AAROM - re-attempt 12 reps of each.   Goals Short Term Goals Short Term Goal 1: Pt will be educated on HEP Short Term Goal 1 Progress: Progressing toward goal Short Term Goal 2: Pt will achieve BUE shoulder AROM WFL in supine in working towards improving his golf swing. Short Term Goal 2 Progress: Progressing toward goal Short Term Goal 3: Pt will improve BUE strength to atlesat 4-/5 for improved ability to reach for with shaving supplies during morning ADLs. Short Term Goal 3 Progress: Progressing toward goal Short Term Goal 4: Pt will have decreased fascial restrictions to min-mod in BUE for decrased pain with motion. Short Term Goal 4 Progress: Progressing toward goal Short Term Goal 5: Pt will improve bilateral grip strength by atleast 5# each. Short Term Goal 5 Progress: Progressing toward goal Long Term Goals Long Term Goal 1: Pt will achieve highest level of functioning with all ADLs, IADLs, and leisure tasks when using BUE. Long Term Goal 1 Progress: Progressing toward goal Long Term Goal 2: Pt will achieve BUE shoulder AROM WFL in standing in working towards inmprving his golf swing. Long Term Goal 2 Progress: Progressing toward goal Long Term Goal 3: Pt will improve BUE shoulder strength to atleast 4/5 for improved ability to reach for items off of shelves. Long Term Goal 3 Progress: Progressing toward goal Long Term Goal 4: Pt will have decreased fascial restictions to trace-min in BUE. Long Term Goal 4 Progress: Progressing toward goal Long Term Goal 5: Pt will improve bilateral grip strength by atleast 10# each. Long Term Goal 5 Progress: Progressing toward goal Long Term Goal 6: Pt will verbalize less than 1/10 pain in his shoulder with movement. Long Term Goal 6 Progress: Progressing toward goal  Problem List Patient Active Problem List   Diagnosis Date Noted  .  Muscle  weakness (generalized) 12/05/2013  . Tight fascia 12/05/2013  . Decreased range of motion of right shoulder 12/05/2013  . Decreased range of motion of left shoulder 12/05/2013  . Bilateral leg weakness 11/21/2013  . Poor balance 11/21/2013  . Difficulty in walking(719.7) 11/21/2013  . Shoulder pain, left 09/12/2013  . Anemia, iron deficiency 09/09/2013  . Dyspnea 09/07/2013  . UTI (urinary tract infection) 09/07/2013  . Acute CHF (congestive heart failure) 09/07/2013  . Elevated troponin 09/07/2013  . Fever and chills 09/06/2013  . Altered mental status 09/05/2013  . Neurogenic bowel 08/22/2013  . Cauda equina syndrome with neurogenic bladder 08/22/2013  . Gout flare 08/22/2013  . Postoperative wound infection 08/22/2013  . Lumbar degenerative disc disease 08/02/2013  . Post-operative pain 07/29/2013  . Osteonecrosis 05/05/2013  . Small bowel obstruction 04/17/2013  . Acute renal failure 04/17/2013  . Medial meniscus, posterior horn derangement 02/17/2013  . Knee sprain and strain 02/17/2013  . Trigger point with neck pain 03/18/2012  . Rotator cuff tear arthropathy of right shoulder 09/08/2011  . CAD in native artery 01/09/2011  . Ankle fracture 12/25/2010  . Contusion of shoulder, left 12/25/2010  . PEMPHIGUS VULGARIS 07/02/2010  . MRSA 05/13/2010  . PRURITUS 05/13/2010  . SPINAL STENOSIS, LUMBAR 05/13/2010  . HYPERLIPIDEMIA 04/24/2010  . GASTROESOPHAGEAL REFLUX DISEASE 04/24/2010  . VENTRAL HERNIA, INCISIONAL 04/24/2010  . DEGENERATIVE JOINT DISEASE 04/24/2010  . PYOGENIC ARTHRITIS, SHOULDER REGION 02/04/2010  . HYPERTENSION 01/01/2010  . RUPTURE ROTATOR CUFF 02/19/2009    End of Session Activity Tolerance: Patient tolerated treatment well General Behavior During Therapy: [facility] for tasks assessed/performed  GO    Bea Graff, MS, OTR/L [phone]  12/21/2013, 4:31 PM

## 2013-12-23 ENCOUNTER — Ambulatory Visit (HOSPITAL_COMMUNITY)
Admission: RE | Admit: 2013-12-23 | Discharge: 2013-12-23 | Disposition: A | Discharge status: Home / Self Care | Payer: Medicare Other | Source: Ambulatory Visit | Attending: Pulmonary Disease | Admitting: Pulmonary Disease

## 2013-12-23 DIAGNOSIS — IMO0001 Reserved for inherently not codable concepts without codable children: Secondary | ICD-10-CM | POA: Diagnosis not present

## 2013-12-23 NOTE — Progress Notes (Signed)
Occupational Therapy Treatment Patient Details  Name: [name] MRN: [id number] Date of Birth: 02-01-1941  Today's Date: 12/23/2013 Time: 5400-8676 OT Time Calculation (min): 43 min Manual 1405-1425 (20') Therapeutic Exerciss 1425-1448 (61')  Visit#: 6 of 35  Re-eval: 01/02/14    Authorization: Medicare  Authorization Time Period: Before 10th visit  Authorization Visit#: 6 of 10  Subjective Symptoms/Limitations Symptoms: "It was a little bit there- I took a pain pill earlier." Pain Assessment Currently in Pain?: Yes Pain Score: 5  Pain Location: Shoulder Pain Orientation: Right;Left Pain Type: Chronic pain  Precautions/Restrictions     Exercise/Treatments Supine Protraction: Both;PROM;AAROM;10 reps;12 reps Horizontal ABduction: Both;PROM;10 reps;AAROM;12 reps External Rotation: Both;PROM;10 reps;AAROM;12 reps Internal Rotation: Both;PROM;10 reps;AAROM;12 reps Flexion: Both;PROM;10 reps;AAROM;12 reps ABduction: Both;PROM;10 reps;AAROM;12 reps  ROM / Strengthening / Isometric Strengthening Thumb Tacks: 50" each arm (1 min goal) with mod support for left arm Prot/Ret//Elev/Dep: 1' mod assist given to left arm for support   Manual Therapy Manual Therapy: Myofascial release Myofascial Release: Myofascial release (MFR) to bilateral upper arm, anterior and posterior shoulder regions to decrease fascial restrictions and promote improved, pain-free, range of motion.    Occupational Therapy Assessment and Plan OT Assessment and Plan Clinical Impression Statement: Pt had improved tolerance of 12 reps supine AAROM this session.  Decreased time with thumbtacks, possibly due to fatigue from increaseds supine reps. OT Plan: Continue isometrics in supine.   Goals Short Term Goals Short Term Goal 1: Pt will be educated on HEP Short Term Goal 1 Progress: Progressing toward goal Short Term Goal 2: Pt will achieve BUE shoulder AROM WFL in supine in working towards improving  his golf swing. Short Term Goal 2 Progress: Progressing toward goal Short Term Goal 3: Pt will improve BUE strength to atlesat 4-/5 for improved ability to reach for with shaving supplies during morning ADLs. Short Term Goal 3 Progress: Progressing toward goal Short Term Goal 4: Pt will have decreased fascial restrictions to min-mod in BUE for decrased pain with motion. Short Term Goal 4 Progress: Progressing toward goal Short Term Goal 5: Pt will improve bilateral grip strength by atleast 5# each. Short Term Goal 5 Progress: Progressing toward goal Long Term Goals Long Term Goal 1: Pt will achieve highest level of functioning with all ADLs, IADLs, and leisure tasks when using BUE. Long Term Goal 1 Progress: Progressing toward goal Long Term Goal 2: Pt will achieve BUE shoulder AROM WFL in standing in working towards inmprving his golf swing. Long Term Goal 2 Progress: Progressing toward goal Long Term Goal 3: Pt will improve BUE shoulder strength to atleast 4/5 for improved ability to reach for items off of shelves. Long Term Goal 3 Progress: Progressing toward goal Long Term Goal 4: Pt will have decreased fascial restictions to trace-min in BUE. Long Term Goal 4 Progress: Progressing toward goal Long Term Goal 5: Pt will improve bilateral grip strength by atleast 10# each. Long Term Goal 5 Progress: Progressing toward goal Long Term Goal 6: Pt will verbalize less than 1/10 pain in his shoulder with movement. Long Term Goal 6 Progress: Progressing toward goal  Problem List Patient Active Problem List   Diagnosis Date Noted  . Muscle weakness (generalized) 12/05/2013  . Tight fascia 12/05/2013  . Decreased range of motion of right shoulder 12/05/2013  . Decreased range of motion of left shoulder 12/05/2013  . Bilateral leg weakness 11/21/2013  . Poor balance 11/21/2013  . Difficulty in walking(719.7) 11/21/2013  . Shoulder pain, left 09/12/2013  .  Anemia, iron deficiency 09/09/2013   . Dyspnea 09/07/2013  . UTI (urinary tract infection) 09/07/2013  . Acute CHF (congestive heart failure) 09/07/2013  . Elevated troponin 09/07/2013  . Fever and chills 09/06/2013  . Altered mental status 09/05/2013  . Neurogenic bowel 08/22/2013  . Cauda equina syndrome with neurogenic bladder 08/22/2013  . Gout flare 08/22/2013  . Postoperative wound infection 08/22/2013  . Lumbar degenerative disc disease 08/02/2013  . Post-operative pain 07/29/2013  . Osteonecrosis 05/05/2013  . Small bowel obstruction 04/17/2013  . Acute renal failure 04/17/2013  . Medial meniscus, posterior horn derangement 02/17/2013  . Knee sprain and strain 02/17/2013  . Trigger point with neck pain 03/18/2012  . Rotator cuff tear arthropathy of right shoulder 09/08/2011  . CAD in native artery 01/09/2011  . Ankle fracture 12/25/2010  . Contusion of shoulder, left 12/25/2010  . PEMPHIGUS VULGARIS 07/02/2010  . MRSA 05/13/2010  . PRURITUS 05/13/2010  . SPINAL STENOSIS, LUMBAR 05/13/2010  . HYPERLIPIDEMIA 04/24/2010  . GASTROESOPHAGEAL REFLUX DISEASE 04/24/2010  . VENTRAL HERNIA, INCISIONAL 04/24/2010  . DEGENERATIVE JOINT DISEASE 04/24/2010  . PYOGENIC ARTHRITIS, SHOULDER REGION 02/04/2010  . HYPERTENSION 01/01/2010  . RUPTURE ROTATOR CUFF 02/19/2009    End of Session Activity Tolerance: Patient tolerated treatment well General Behavior During Therapy: [facility] for tasks assessed/performed  GO    Bea Graff, MS, OTR/L [phone]  12/23/2013, 3:11 PM

## 2013-12-26 ENCOUNTER — Ambulatory Visit (HOSPITAL_COMMUNITY)
Admission: RE | Admit: 2013-12-26 | Discharge: 2013-12-26 | Disposition: A | Discharge status: Home / Self Care | Payer: Medicare Other | Source: Ambulatory Visit | Attending: Pulmonary Disease | Admitting: Pulmonary Disease

## 2013-12-26 ENCOUNTER — Ambulatory Visit (HOSPITAL_COMMUNITY)
Admission: RE | Admit: 2013-12-26 | Payer: Medicare Other | Source: Ambulatory Visit | Attending: Pulmonary Disease | Admitting: Pulmonary Disease

## 2013-12-26 DIAGNOSIS — R262 Difficulty in walking, not elsewhere classified: Secondary | ICD-10-CM | POA: Insufficient documentation

## 2013-12-26 DIAGNOSIS — M6281 Muscle weakness (generalized): Secondary | ICD-10-CM

## 2013-12-26 DIAGNOSIS — M545 Low back pain, unspecified: Secondary | ICD-10-CM | POA: Diagnosis not present

## 2013-12-26 DIAGNOSIS — M629 Disorder of muscle, unspecified: Secondary | ICD-10-CM

## 2013-12-26 DIAGNOSIS — IMO0001 Reserved for inherently not codable concepts without codable children: Secondary | ICD-10-CM | POA: Diagnosis not present

## 2013-12-26 DIAGNOSIS — I10 Essential (primary) hypertension: Secondary | ICD-10-CM | POA: Diagnosis not present

## 2013-12-26 DIAGNOSIS — M25611 Stiffness of right shoulder, not elsewhere classified: Secondary | ICD-10-CM

## 2013-12-26 DIAGNOSIS — M25612 Stiffness of left shoulder, not elsewhere classified: Secondary | ICD-10-CM

## 2013-12-26 DIAGNOSIS — M6289 Other specified disorders of muscle: Secondary | ICD-10-CM

## 2013-12-26 DIAGNOSIS — R29898 Other symptoms and signs involving the musculoskeletal system: Secondary | ICD-10-CM | POA: Insufficient documentation

## 2013-12-26 NOTE — Progress Notes (Signed)
Physical Therapy Treatment Patient Details  Name: Jake MarvelRonald W Tisdale MRN: [id number] Date of Birth: 06/13/1941  Today's Date: 12/26/2013 Time: 1302-1348 PT Time Calculation (min): 46 min Charge: [id number]   Visit#: 13 of 24  Re-eval: 01/18/14 Assessment Diagnosis: HNP Next MD Visit: Dr Juanetta GoslingHawkins 12/27/2013  Authorization: Medicare  Authorization Time Period:    Authorization Visit#: 13 of 20   Subjective: Symptoms/Limitations Symptoms: Pt walked with RW into dept today.  Reported walked a block and was able to get a  curb.  Pt stated he mowed yard 2.5 hours and has butt pain following due to muscle mass.  Still has no appetite, reported he ate 1st steak in 6 months last Saturday Pain Assessment Currently in Pain?: Yes Pain Score: 5  Pain Location: Buttocks (Feels like bones cut through butt due to no muscle mass) Pain Orientation: Right;Left  Precautions/Restrictions  Precautions Precautions: Back  Exercise/Treatments Aerobic Stationary Bike: NuStep x10' L5 hills #4 to improve strength and activity tolerance; SPM 118 Standing Forward Lunges: 10 reps;Both;Limitations Forward Lunges Limitations: onto 8in step Side Lunges: Both;10 reps Functional Squat: 15 reps;Limitations;2 sets Functional Squat Limitations: frontal and sagital planes Other Standing Knee Exercises: Static standing with no HHA max 10" today due to fatigue at end of session Other Standing Knee Exercises: Weight shifting 10 reps with 1 HHA; 5 STS no HHA from elevated mat height Supine Bridges: 15 reps Straight Leg Raises: 20 reps;Limitations Straight Leg Raises Limitations: cueing to improve quad extension lag Sidelying Clams: attempted with AAROM- too weak     Physical Therapy Assessment and Plan PT Assessment and Plan Clinical Impression Statement: Continued session focus on improving functional strengthening to assist with static standing, weight bearing with gait and quad/ gluteal strengthening.  Improved glut max strengthen noted today, pt able to complete full bridge today.  Cueing to improve knee extension with SLR, lacking extension due to weak quadriceps. 2 rest breaks required whole session.  Pt able to complete 10 minutes on Nustep wtih over 100 steps per minute.  Limited by fatigue end of session, no reports of pain through session. PT Plan: Continue with functional strengtheing exercises to improve gait mechanics, strength and balance.  Resume mat activities for hip, quad and gluteal strengthening.      Goals PT Short Term Goals PT Short Term Goal 1: Pt to increase Berg balace by 5 to decrease risk of falling PT Short Term Goal 2: Pt to be walking for 10 mintues with rolling walker PT Short Term Goal 2 - Progress: Progressing toward goal PT Short Term Goal 3: Pt to be able to stand for a minute with no assistive device to allow easier grooming  PT Short Term Goal 3 - Progress: Progressing toward goal PT Long Term Goals PT Long Term Goal 1: Berg balance to be increased by 10 to allow decreased risk of falling PT Long Term Goal 1 - Progress: Progressing toward goal PT Long Term Goal 2: Pt to be ambulating with walker x 30 minutes Long Term Goal 3: Pt to be able to stand without assistive device for five minutes to ease grooming Long Term Goal 4: Pt to be able to stand with walker for 15 minutes for socializing . PT Long Term Goal 5: strength of LE improved one grade to allow the above to occur  Problem List Patient Active Problem List   Diagnosis Date Noted  . Muscle weakness (generalized) 12/05/2013  . Tight fascia 12/05/2013  . Decreased range of motion of  right shoulder 12/05/2013  . Decreased range of motion of left shoulder 12/05/2013  . Bilateral leg weakness 11/21/2013  . Poor balance 11/21/2013  . Difficulty in walking(719.7) 11/21/2013  . Shoulder pain, left 09/12/2013  . Anemia, iron deficiency 09/09/2013  . Dyspnea 09/07/2013  . UTI (urinary tract infection)  09/07/2013  . Acute CHF (congestive heart failure) 09/07/2013  . Elevated troponin 09/07/2013  . Fever and chills 09/06/2013  . Altered mental status 09/05/2013  . Neurogenic bowel 08/22/2013  . Cauda equina syndrome with neurogenic bladder 08/22/2013  . Gout flare 08/22/2013  . Postoperative wound infection 08/22/2013  . Lumbar degenerative disc disease 08/02/2013  . Post-operative pain 07/29/2013  . Osteonecrosis 05/05/2013  . Small bowel obstruction 04/17/2013  . Acute renal failure 04/17/2013  . Medial meniscus, posterior horn derangement 02/17/2013  . Knee sprain and strain 02/17/2013  . Trigger point with neck pain 03/18/2012  . Rotator cuff tear arthropathy of right shoulder 09/08/2011  . CAD in native artery 01/09/2011  . Ankle fracture 12/25/2010  . Contusion of shoulder, left 12/25/2010  . PEMPHIGUS VULGARIS 07/02/2010  . MRSA 05/13/2010  . PRURITUS 05/13/2010  . SPINAL STENOSIS, LUMBAR 05/13/2010  . HYPERLIPIDEMIA 04/24/2010  . GASTROESOPHAGEAL REFLUX DISEASE 04/24/2010  . VENTRAL HERNIA, INCISIONAL 04/24/2010  . DEGENERATIVE JOINT DISEASE 04/24/2010  . PYOGENIC ARTHRITIS, SHOULDER REGION 02/04/2010  . HYPERTENSION 01/01/2010  . RUPTURE ROTATOR CUFF 02/19/2009    PT - End of Session Equipment Utilized During Treatment: Gait belt Activity Tolerance: Patient limited by fatigue;Patient tolerated treatment well General Behavior During Therapy: [facility] for tasks assessed/performed  GP    Juel Burrow 12/26/2013, 2:02 PM

## 2013-12-28 ENCOUNTER — Ambulatory Visit (HOSPITAL_COMMUNITY)
Admission: RE | Admit: 2013-12-28 | Discharge: 2013-12-28 | Disposition: A | Discharge status: Home / Self Care | Payer: Medicare Other | Source: Ambulatory Visit | Attending: Pulmonary Disease | Admitting: Pulmonary Disease

## 2013-12-28 DIAGNOSIS — M25612 Stiffness of left shoulder, not elsewhere classified: Secondary | ICD-10-CM

## 2013-12-28 DIAGNOSIS — M25611 Stiffness of right shoulder, not elsewhere classified: Secondary | ICD-10-CM

## 2013-12-28 DIAGNOSIS — M629 Disorder of muscle, unspecified: Secondary | ICD-10-CM

## 2013-12-28 DIAGNOSIS — M6281 Muscle weakness (generalized): Secondary | ICD-10-CM

## 2013-12-28 DIAGNOSIS — R29898 Other symptoms and signs involving the musculoskeletal system: Secondary | ICD-10-CM

## 2013-12-28 DIAGNOSIS — IMO0001 Reserved for inherently not codable concepts without codable children: Secondary | ICD-10-CM | POA: Diagnosis not present

## 2013-12-28 DIAGNOSIS — M6289 Other specified disorders of muscle: Secondary | ICD-10-CM

## 2013-12-28 DIAGNOSIS — R2689 Other abnormalities of gait and mobility: Secondary | ICD-10-CM

## 2013-12-28 DIAGNOSIS — R262 Difficulty in walking, not elsewhere classified: Secondary | ICD-10-CM

## 2013-12-28 NOTE — Progress Notes (Signed)
Occupational Therapy Treatment Patient Details  Name: [name] MRN: [id number] Date of Birth: [birth year]  Today's Date: 12/28/2013 Time: 4650-3546 OT Time Calculation (min): 39 min MFR 5681-2751 14' Therex 7001-7494 25'  Visit#: 8 of 16  Re-eval: 01/02/14    Authorization: Medicare  Authorization Time Period: Before 10th visit  Authorization Visit#: 8 of 10  Subjective Symptoms/Limitations Symptoms: S: My arms don't hurt today.  Pain Assessment Currently in Pain?: No/denies  Precautions/Restrictions  Precautions Precautions: Back  Exercise/Treatments Supine Protraction: Both;PROM;5 reps;AROM;10 reps Horizontal ABduction: Both;PROM;5 reps;AROM;10 reps External Rotation: Both;PROM;5 reps;AROM;10 reps Internal Rotation: Both;PROM;5 reps;AROM;10 reps Flexion: Both;PROM;5 reps;AROM;10 reps ABduction: Both;PROM;5 reps;AROM;10 reps;Limitations ABduction Limitations: only have to achieve to 90 degrees Seated Protraction: AAROM;10 reps Horizontal ABduction: AAROM;10 reps;Limitations Horizontal ABduction Limitations: needed assist from therapist to prevent dowel from droppping External Rotation: AAROM;10 reps Internal Rotation: AAROM;10 reps Flexion: AAROM;Limitations (3x) Flexion Limitations: needed assist to get past 90 degrees with left arm. Abduction: AAROM;10 reps ROM / Strengthening / Isometric Strengthening UBE (Upper Arm Bike): 3.0 3' forward 3' reverse Thumb Tacks: 1' with min-mod assist from therapist for arm suport       Manual Therapy Manual Therapy: Myofascial release Myofascial Release: Myofascial release (MFR) to bilateral upper arm, anterior and posterior shoulder regions to decrease fascial restrictions and promote improved, pain-free, range of motion.  Occupational Therapy Assessment and Plan OT Assessment and Plan Clinical Impression Statement: A: added AROM supine and UBE bike this date. patient tolerated well. Continues to require min-mod  assist with left shoulder during flexion and horizontal abduction with AAROM movements. Patient continues to have difficulty with thumb tacks due to shoulder fatigue.  OT Plan: P: Cont to work on increasing BUE shoulder strength needed to complete daily tasks.    Goals Short Term Goals Short Term Goal 1: Pt will be educated on HEP Short Term Goal 2: Pt will achieve BUE shoulder AROM WFL in supine in working towards improving his golf swing. Short Term Goal 3: Pt will improve BUE strength to atlesat 4-/5 for improved ability to reach for with shaving supplies during morning ADLs. Short Term Goal 4: Pt will have decreased fascial restrictions to min-mod in BUE for decrased pain with motion.  Problem List Patient Active Problem List   Diagnosis Date Noted  . Muscle weakness (generalized) 12/05/2013  . Tight fascia 12/05/2013  . Decreased range of motion of right shoulder 12/05/2013  . Decreased range of motion of left shoulder 12/05/2013  . Bilateral leg weakness 11/21/2013  . Poor balance 11/21/2013  . Difficulty in walking(719.7) 11/21/2013  . Shoulder pain, left 09/12/2013  . Anemia, iron deficiency 09/09/2013  . Dyspnea 09/07/2013  . UTI (urinary tract infection) 09/07/2013  . Acute CHF (congestive heart failure) 09/07/2013  . Elevated troponin 09/07/2013  . Fever and chills 09/06/2013  . Altered mental status 09/05/2013  . Neurogenic bowel 08/22/2013  . Cauda equina syndrome with neurogenic bladder 08/22/2013  . Gout flare 08/22/2013  . Postoperative wound infection 08/22/2013  . Lumbar degenerative disc disease 08/02/2013  . Post-operative pain 07/29/2013  . Osteonecrosis 05/05/2013  . Small bowel obstruction 04/17/2013  . Acute renal failure 04/17/2013  . Medial meniscus, posterior horn derangement 02/17/2013  . Knee sprain and strain 02/17/2013  . Trigger point with neck pain 03/18/2012  . Rotator cuff tear arthropathy of right shoulder 09/08/2011  . CAD in native artery  01/09/2011  . Ankle fracture 12/25/2010  . Contusion of shoulder, left 12/25/2010  . PEMPHIGUS VULGARIS  07/02/2010  . MRSA 05/13/2010  . PRURITUS 05/13/2010  . SPINAL STENOSIS, LUMBAR 05/13/2010  . HYPERLIPIDEMIA 04/24/2010  . GASTROESOPHAGEAL REFLUX DISEASE 04/24/2010  . VENTRAL HERNIA, INCISIONAL 04/24/2010  . DEGENERATIVE JOINT DISEASE 04/24/2010  . PYOGENIC ARTHRITIS, SHOULDER REGION 02/04/2010  . HYPERTENSION 01/01/2010  . RUPTURE ROTATOR CUFF 02/19/2009    End of Session Activity Tolerance: Patient tolerated treatment well General Behavior During Therapy: [facility] for tasks assessed/performed   Limmie PatriciaLaura Eleftheria Taborn, OTR/L,CBIS   12/28/2013, 1:44 PM

## 2013-12-28 NOTE — Progress Notes (Signed)
Occupational Therapy Treatment Patient Details  Name: [name] MRN: [id number] Date of Birth: August 27, 1940  Today's Date: 12/28/2013 Time: 9774-1423 OT Time Calculation (min): 32 min Manual therapy 9532-0233 17' Therapeutic exercises 1415-1430 15' Visit#: 7 of 16  Re-eval: 01/02/14    Authorization: Medicare  Authorization Time Period: Before 10th visit  Authorization Visit#: 7 of 10  Subjective  S: I am not sure how well these arms will do. Pain Assessment Currently in Pain?: No/denies   Exercise/Treatments Supine Protraction: Both;PROM;10 reps;AAROM;15 reps Horizontal ABduction: Both;PROM;10 reps;AAROM;15 reps External Rotation: Both;PROM;10 reps;AAROM;15 reps Internal Rotation: Both;PROM;10 reps;AAROM;15 reps Flexion: Both;PROM;10 reps;AAROM;15 reps ABduction: Both;PROM;10 reps;AAROM;15 reps Seated Protraction: AAROM;10 reps Horizontal ABduction: AAROM;10 reps External Rotation: AAROM;10 reps Internal Rotation: AAROM;10 reps Flexion: AAROM;10 reps Abduction: AAROM;10 reps    Manual Therapy Manual Therapy: Myofascial release Myofascial Release: Myofascial release (MFR) to bilateral upper arm, anterior and posterior shoulder regions to decrease fascial restrictions and promote improved, pain-free, range of motion.   Occupational Therapy Assessment and Plan OT Assessment and Plan Clinical Impression Statement: A:  Treatment session shortened today due to environmental concerns in department.  Added AAROM in seated this date.  OT Plan: P:  Attempt AROM of protraction and horizontal abduction in supine, attempt UBE for increase AAROM and strengthening of available range.    Goals    Problem List Patient Active Problem List   Diagnosis Date Noted  . Muscle weakness (generalized) 12/05/2013  . Tight fascia 12/05/2013  . Decreased range of motion of right shoulder 12/05/2013  . Decreased range of motion of left shoulder 12/05/2013  . Bilateral leg weakness  11/21/2013  . Poor balance 11/21/2013  . Difficulty in walking(719.7) 11/21/2013  . Shoulder pain, left 09/12/2013  . Anemia, iron deficiency 09/09/2013  . Dyspnea 09/07/2013  . UTI (urinary tract infection) 09/07/2013  . Acute CHF (congestive heart failure) 09/07/2013  . Elevated troponin 09/07/2013  . Fever and chills 09/06/2013  . Altered mental status 09/05/2013  . Neurogenic bowel 08/22/2013  . Cauda equina syndrome with neurogenic bladder 08/22/2013  . Gout flare 08/22/2013  . Postoperative wound infection 08/22/2013  . Lumbar degenerative disc disease 08/02/2013  . Post-operative pain 07/29/2013  . Osteonecrosis 05/05/2013  . Small bowel obstruction 04/17/2013  . Acute renal failure 04/17/2013  . Medial meniscus, posterior horn derangement 02/17/2013  . Knee sprain and strain 02/17/2013  . Trigger point with neck pain 03/18/2012  . Rotator cuff tear arthropathy of right shoulder 09/08/2011  . CAD in native artery 01/09/2011  . Ankle fracture 12/25/2010  . Contusion of shoulder, left 12/25/2010  . PEMPHIGUS VULGARIS 07/02/2010  . MRSA 05/13/2010  . PRURITUS 05/13/2010  . SPINAL STENOSIS, LUMBAR 05/13/2010  . HYPERLIPIDEMIA 04/24/2010  . GASTROESOPHAGEAL REFLUX DISEASE 04/24/2010  . VENTRAL HERNIA, INCISIONAL 04/24/2010  . DEGENERATIVE JOINT DISEASE 04/24/2010  . PYOGENIC ARTHRITIS, SHOULDER REGION 02/04/2010  . HYPERTENSION 01/01/2010  . RUPTURE ROTATOR CUFF 02/19/2009    End of Session Activity Tolerance: Patient tolerated treatment well General Behavior During Therapy: [facility] for tasks assessed/performed  GO    Shirlean Mylar, OTR/L [phone]  12/28/2013, 10:47 AM

## 2013-12-28 NOTE — Progress Notes (Signed)
Physical Therapy Treatment Patient Details  Name: KIYAN MCCLAVE MRN: [id number] Date of Birth: 04-09-1941  Today's Date: 12/28/2013 Time: 1350-1435 PT Time Calculation (min): 45 min Neuro reed 1350 1420; there ex 1420-1435 Visit#: 14 of 24  Re-eval: 01/18/14    Authorization: Medicare  Authorization Visit#: 14 of 20   Subjective: Symptoms/Limitations Symptoms: Patient states he is walking a lot more, and feels stronger.  Pain Assessment Currently in Pain?: No/denies  Precautions/Restrictions  Precautions Precautions: Back  Exercise/Treatments Mobility/Balance        Balance Exercises Standing Tandem Stance: Eyes open;Hand held assist (HHA) 1;15 secs Sidestepping: 2 reps (8 feet RT with (B) HHA) Sidestepping Limitations:  (VC to avoid compensation of lateral trunk lean) Marching: Solid surface;5 reps (With increased time in march position to tolerance) Sit to Stand: Standard surface Sit to Stand Limitations:  (Sit <-> stand x10) Other Standing Exercises: wt shifting side to side, forward and back     Seated Other Seated Exercises: NuStep L5 x10 minutes  Supine    Manual Therapy Manual Therapy: Myofascial release Myofascial Release: Myofascial release (MFR) to bilateral upper arm, anterior and posterior shoulder regions to decrease fascial restrictions and promote improved, pain-free, range of motion.  Physical Therapy Assessment and Plan PT Assessment and Plan Clinical Impression Statement: Pt completed balance activities with therapist facilitation to prevent fall. Pt continues to need multiple rest breaks.  Pt tends to lose balance with center of gravity behind base of support.   PT Plan: Continue with functional strengtheing exercises to improve gait mechanics, strength and balance.  Resume mat activities for hip, quad and gluteal strengthening.      Goals  progressing  Problem List Patient Active Problem List   Diagnosis Date Noted  . Muscle weakness  (generalized) 12/05/2013  . Tight fascia 12/05/2013  . Decreased range of motion of right shoulder 12/05/2013  . Decreased range of motion of left shoulder 12/05/2013  . Bilateral leg weakness 11/21/2013  . Poor balance 11/21/2013  . Difficulty in walking(719.7) 11/21/2013  . Shoulder pain, left 09/12/2013  . Anemia, iron deficiency 09/09/2013  . Dyspnea 09/07/2013  . UTI (urinary tract infection) 09/07/2013  . Acute CHF (congestive heart failure) 09/07/2013  . Elevated troponin 09/07/2013  . Fever and chills 09/06/2013  . Altered mental status 09/05/2013  . Neurogenic bowel 08/22/2013  . Cauda equina syndrome with neurogenic bladder 08/22/2013  . Gout flare 08/22/2013  . Postoperative wound infection 08/22/2013  . Lumbar degenerative disc disease 08/02/2013  . Post-operative pain 07/29/2013  . Osteonecrosis 05/05/2013  . Small bowel obstruction 04/17/2013  . Acute renal failure 04/17/2013  . Medial meniscus, posterior horn derangement 02/17/2013  . Knee sprain and strain 02/17/2013  . Trigger point with neck pain 03/18/2012  . Rotator cuff tear arthropathy of right shoulder 09/08/2011  . CAD in native artery 01/09/2011  . Ankle fracture 12/25/2010  . Contusion of shoulder, left 12/25/2010  . PEMPHIGUS VULGARIS 07/02/2010  . MRSA 05/13/2010  . PRURITUS 05/13/2010  . SPINAL STENOSIS, LUMBAR 05/13/2010  . HYPERLIPIDEMIA 04/24/2010  . GASTROESOPHAGEAL REFLUX DISEASE 04/24/2010  . VENTRAL HERNIA, INCISIONAL 04/24/2010  . DEGENERATIVE JOINT DISEASE 04/24/2010  . PYOGENIC ARTHRITIS, SHOULDER REGION 02/04/2010  . HYPERTENSION 01/01/2010  . RUPTURE ROTATOR CUFF 02/19/2009    PT - End of Session Equipment Utilized During Treatment: Gait belt Activity Tolerance: Patient limited by fatigue General Behavior During Therapy: [facility] for tasks assessed/performed  GP    Bella Kennedy 12/28/2013, 2:29 PM

## 2013-12-30 ENCOUNTER — Ambulatory Visit (HOSPITAL_COMMUNITY)
Admission: RE | Admit: 2013-12-30 | Discharge: 2013-12-30 | Disposition: A | Discharge status: Home / Self Care | Payer: Medicare Other | Source: Ambulatory Visit

## 2013-12-30 DIAGNOSIS — R2689 Other abnormalities of gait and mobility: Secondary | ICD-10-CM

## 2013-12-30 DIAGNOSIS — R29898 Other symptoms and signs involving the musculoskeletal system: Secondary | ICD-10-CM

## 2013-12-30 DIAGNOSIS — R262 Difficulty in walking, not elsewhere classified: Secondary | ICD-10-CM

## 2013-12-30 DIAGNOSIS — IMO0001 Reserved for inherently not codable concepts without codable children: Secondary | ICD-10-CM | POA: Diagnosis not present

## 2013-12-30 NOTE — Progress Notes (Signed)
Occupational Therapy Treatment Patient Details  Name: [name] MRN: [id number] Date of Birth: 1940-09-22  Today's Date: 12/30/2013 Time: 1305-1350 OT Time Calculation (min): 45 min Manual 1305-1325 (20') Therapeutic Exercises 1325-1350 (25')   Visit#: 9 of 16  Re-eval: 01/02/14    Authorization: Medicare  Authorization Time Period: Before 10th visit  Authorization Visit#: 9 of 10  Subjective Symptoms/Limitations Symptoms: S: We're in good shape I think. Pain Assessment Currently in Pain?: No/denies  Precautions/Restrictions     Exercise/Treatments Supine Protraction: Both;PROM;5 reps;AROM;10 reps Horizontal ABduction: Both;PROM;5 reps;AROM;10 reps External Rotation: Both;PROM;5 reps;AROM;10 reps Internal Rotation: Both;PROM;5 reps;AROM;10 reps Flexion: Both;PROM;5 reps;AROM;10 reps;Limitations Flexion Limitations: fingertip faciliation for left ABduction: Both;PROM;5 reps;AROM;10 reps;Limitations ABduction Limitations: fingertip faciliation bilaterally Seated Protraction: AAROM;10 reps Horizontal ABduction: AAROM;12 reps;Limitations Horizontal ABduction Limitations: needed assist from therapist to prevent dowel from droppping External Rotation: AAROM;12 reps Internal Rotation: AAROM;12 reps Flexion: AAROM;10 reps;Limitations Flexion Limitations: with faciliation to reach past 90 bilaterally Abduction: AAROM;Both;12 reps ROM / Strengthening / Isometric Strengthening UBE (Upper Arm Bike): 3.0 3' forward 3' reverse   Manual Therapy Manual Therapy: Myofascial release Myofascial Release: Myofascial release (MFR) to bilateral upper arm, anterior and posterior shoulder regions to decrease fascial restrictions and promote improved, pain-free, range of motion.    Occupational Therapy Assessment and Plan OT Assessment and Plan Clinical Impression Statement: Increased sitting AAROM reps this session, with good tolerance.  Pt still needing assist to reach through 90  degree range in flexion and abduction.   OT Plan: Re-Evaluation and G-codes.. continue increasing strenght through 90 degree range.   Goals Short Term Goals Short Term Goal 1: Pt will be educated on HEP Short Term Goal 1 Progress: Progressing toward goal Short Term Goal 2: Pt will achieve BUE shoulder AROM WFL in supine in working towards improving his golf swing. Short Term Goal 2 Progress: Progressing toward goal Short Term Goal 3: Pt will improve BUE strength to atlesat 4-/5 for improved ability to reach for with shaving supplies during morning ADLs. Short Term Goal 3 Progress: Progressing toward goal Short Term Goal 4: Pt will have decreased fascial restrictions to min-mod in BUE for decrased pain with motion. Short Term Goal 4 Progress: Progressing toward goal Short Term Goal 5: Pt will improve bilateral grip strength by atleast 5# each. Short Term Goal 5 Progress: Progressing toward goal Long Term Goals Long Term Goal 1: Pt will achieve highest level of functioning with all ADLs, IADLs, and leisure tasks when using BUE. Long Term Goal 1 Progress: Progressing toward goal Long Term Goal 2: Pt will achieve BUE shoulder AROM WFL in standing in working towards inmprving his golf swing. Long Term Goal 2 Progress: Progressing toward goal Long Term Goal 3: Pt will improve BUE shoulder strength to atleast 4/5 for improved ability to reach for items off of shelves. Long Term Goal 3 Progress: Progressing toward goal Long Term Goal 4: Pt will have decreased fascial restictions to trace-min in BUE. Long Term Goal 4 Progress: Progressing toward goal Long Term Goal 5: Pt will improve bilateral grip strength by atleast 10# each. Long Term Goal 5 Progress: Progressing toward goal Long Term Goal 6: Pt will verbalize less than 1/10 pain in his shoulder with movement. Long Term Goal 6 Progress: Progressing toward goal  Problem List Patient Active Problem List   Diagnosis Date Noted  . Muscle  weakness (generalized) 12/05/2013  . Tight fascia 12/05/2013  . Decreased range of motion of right shoulder 12/05/2013  . Decreased range  of motion of left shoulder 12/05/2013  . Bilateral leg weakness 11/21/2013  . Poor balance 11/21/2013  . Difficulty in walking(719.7) 11/21/2013  . Shoulder pain, left 09/12/2013  . Anemia, iron deficiency 09/09/2013  . Dyspnea 09/07/2013  . UTI (urinary tract infection) 09/07/2013  . Acute CHF (congestive heart failure) 09/07/2013  . Elevated troponin 09/07/2013  . Fever and chills 09/06/2013  . Altered mental status 09/05/2013  . Neurogenic bowel 08/22/2013  . Cauda equina syndrome with neurogenic bladder 08/22/2013  . Gout flare 08/22/2013  . Postoperative wound infection 08/22/2013  . Lumbar degenerative disc disease 08/02/2013  . Post-operative pain 07/29/2013  . Osteonecrosis 05/05/2013  . Small bowel obstruction 04/17/2013  . Acute renal failure 04/17/2013  . Medial meniscus, posterior horn derangement 02/17/2013  . Knee sprain and strain 02/17/2013  . Trigger point with neck pain 03/18/2012  . Rotator cuff tear arthropathy of right shoulder 09/08/2011  . CAD in native artery 01/09/2011  . Ankle fracture 12/25/2010  . Contusion of shoulder, left 12/25/2010  . PEMPHIGUS VULGARIS 07/02/2010  . MRSA 05/13/2010  . PRURITUS 05/13/2010  . SPINAL STENOSIS, LUMBAR 05/13/2010  . HYPERLIPIDEMIA 04/24/2010  . GASTROESOPHAGEAL REFLUX DISEASE 04/24/2010  . VENTRAL HERNIA, INCISIONAL 04/24/2010  . DEGENERATIVE JOINT DISEASE 04/24/2010  . PYOGENIC ARTHRITIS, SHOULDER REGION 02/04/2010  . HYPERTENSION 01/01/2010  . RUPTURE ROTATOR CUFF 02/19/2009    End of Session Activity Tolerance: Patient tolerated treatment well [facility] for tasks assessed/performed  GO    Marry GuanMarie Rawlings, MS, OTR/L [phone]  12/30/2013, 1:48 PM

## 2013-12-30 NOTE — Progress Notes (Signed)
Physical Therapy Treatment Patient Details  Name: [name] MRN: [id number] Date of Birth: 05/17/1941  Today's Date: 12/30/2013 Time: 4098-11911352-1432 PT Time Calculation (min): 40 min Charge: TE [id number]   Visit#: 15 of 24  Re-eval: 01/18/14 Assessment Diagnosis: HNP Next MD Visit: Dr Juanetta GoslingHawkins 3-4 weeks  Authorization: Medicare  Authorization Time Period:    Authorization Visit#: 15 of 20   Subjective: Symptoms/Limitations Symptoms: Pt stated he climbed stairs yesterday with increased difficulty descending stairs requiring help.  Pain free today. Pain Assessment Currently in Pain?: No/denies  Precautions/Restrictions  Precautions Precautions: Back  Exercise/Treatments Aerobic Stationary Bike: NuStep x10' L5 hills #4 to improve strength and activity tolerance; SPM 140 Standing Functional Squat: 15 reps;Limitations;2 sets Functional Squat Limitations:    Other Standing Knee Exercises: Static standing with no HHA max 10" today static standing abduction and extension with therapist faciliation 10x Bil LE Other Standing Knee Exercises: 3D hip excusion with 1 HHA   Balance Exercises Standing Sidestepping: 2 reps Marching: Solid surface;10 reps;Limitations Marching Limitations: HHA 5" holds Sit to Stand: Standard surface Sit to Stand Limitations: Sit to stand x 10 standing as long as possible with 1 HHA Other Standing Exercises: wt shifting side to side, forward and back   Physical Therapy Assessment and Plan PT Assessment and Plan Clinical Impression Statement: Continue balance activtieis with therapist faciliation to reduce risk of falls.  Pt with tendency to lean posterior and requires assistance to place center of gravity over base of support.  Added static standing hip abduction and extension for gluteal strengthening with therapist faciliation for proper form and technique.  Pt fatigued end of session, no reports of pain through session.   PT Plan: Continue with  functional strengtheing exercises to improve gait mechanics, strength and balance.  Resume mat activities for hip, quad and gluteal strengthening.      Goals PT Short Term Goals PT Short Term Goal 1: Pt to increase Berg balace by 5 to decrease risk of falling PT Short Term Goal 2: Pt to be walking for 10 mintues with rolling walker PT Short Term Goal 2 - Progress: Progressing toward goal PT Short Term Goal 3: Pt to be able to stand for a minute with no assistive device to allow easier grooming  PT Short Term Goal 3 - Progress: Progressing toward goal PT Long Term Goals PT Long Term Goal 1: Berg balance to be increased by 10 to allow decreased risk of falling PT Long Term Goal 2: Pt to be ambulating with walker x 30 minutes PT Long Term Goal 2 - Progress: Progressing toward goal Long Term Goal 3: Pt to be able to stand without assistive device for five minutes to ease grooming Long Term Goal 4: Pt to be able to stand with walker for 15 minutes for socializing . Long Term Goal 4 Progress: Progressing toward goal PT Long Term Goal 5: strength of LE improved one grade to allow the above to occur Long Term Goal 5 Progress: Progressing toward goal  Problem List Patient Active Problem List   Diagnosis Date Noted  . Muscle weakness (generalized) 12/05/2013  . Tight fascia 12/05/2013  . Decreased range of motion of right shoulder 12/05/2013  . Decreased range of motion of left shoulder 12/05/2013  . Bilateral leg weakness 11/21/2013  . Poor balance 11/21/2013  . Difficulty in walking(719.7) 11/21/2013  . Shoulder pain, left 09/12/2013  . Anemia, iron deficiency 09/09/2013  . Dyspnea 09/07/2013  . UTI (urinary tract infection) 09/07/2013  .  Acute CHF (congestive heart failure) 09/07/2013  . Elevated troponin 09/07/2013  . Fever and chills 09/06/2013  . Altered mental status 09/05/2013  . Neurogenic bowel 08/22/2013  . Cauda equina syndrome with neurogenic bladder 08/22/2013  . Gout flare  08/22/2013  . Postoperative wound infection 08/22/2013  . Lumbar degenerative disc disease 08/02/2013  . Post-operative pain 07/29/2013  . Osteonecrosis 05/05/2013  . Small bowel obstruction 04/17/2013  . Acute renal failure 04/17/2013  . Medial meniscus, posterior horn derangement 02/17/2013  . Knee sprain and strain 02/17/2013  . Trigger point with neck pain 03/18/2012  . Rotator cuff tear arthropathy of right shoulder 09/08/2011  . CAD in native artery 01/09/2011  . Ankle fracture 12/25/2010  . Contusion of shoulder, left 12/25/2010  . PEMPHIGUS VULGARIS 07/02/2010  . MRSA 05/13/2010  . PRURITUS 05/13/2010  . SPINAL STENOSIS, LUMBAR 05/13/2010  . HYPERLIPIDEMIA 04/24/2010  . GASTROESOPHAGEAL REFLUX DISEASE 04/24/2010  . VENTRAL HERNIA, INCISIONAL 04/24/2010  . DEGENERATIVE JOINT DISEASE 04/24/2010  . PYOGENIC ARTHRITIS, SHOULDER REGION 02/04/2010  . HYPERTENSION 01/01/2010  . RUPTURE ROTATOR CUFF 02/19/2009    PT - End of Session Equipment Utilized During Treatment: Gait belt Activity Tolerance: Patient limited by fatigue;Patient tolerated treatment well  GP    Juel Burrow 12/30/2013, 6:02 PM

## 2014-01-02 ENCOUNTER — Ambulatory Visit (HOSPITAL_COMMUNITY)
Admission: RE | Admit: 2014-01-02 | Discharge: 2014-01-02 | Disposition: A | Discharge status: Home / Self Care | Payer: Medicare Other | Source: Ambulatory Visit | Attending: Pulmonary Disease | Admitting: Pulmonary Disease

## 2014-01-02 DIAGNOSIS — R262 Difficulty in walking, not elsewhere classified: Secondary | ICD-10-CM

## 2014-01-02 DIAGNOSIS — R29898 Other symptoms and signs involving the musculoskeletal system: Secondary | ICD-10-CM

## 2014-01-02 DIAGNOSIS — R2689 Other abnormalities of gait and mobility: Secondary | ICD-10-CM

## 2014-01-02 DIAGNOSIS — IMO0001 Reserved for inherently not codable concepts without codable children: Secondary | ICD-10-CM | POA: Diagnosis not present

## 2014-01-02 NOTE — Progress Notes (Signed)
Physical Therapy Treatment Patient Details  Name: Jake MarvelRonald W Rackers MRN: [id number] Date of Birth: 01/21/1941  Today's Date: 01/02/2014 Time: 1300-1345 PT Time Calculation (min): 45 min Charge: TE [id number]  Visit#: 16 of 24  Re-eval: 01/18/14 Assessment Diagnosis: HNP Next MD Visit: Dr Juanetta GoslingHawkins 3-4 weeks  Authorization: Medicare  Authorization Time Period:    Authorization Visit#: 16 of 20   Subjective: Symptoms/Limitations Symptoms: Pain free, went to church last night.   Pain Assessment Currently in Pain?: No/denies  Precautions/Restrictions  Precautions Precautions: Back  Exercise/Treatments Aerobic Stationary Bike: NuStep x10' L5 hills #4 to improve strength and activity tolerance; SPM 140 OT time Standing Functional Squat: 20 reps Other Standing Knee Exercises: 3D hip excusion with 1 HHA Supine Short Arc Quad Sets: Both;15 reps;Limitations Short Arc Quad Sets Limitations: 5# Bridges: 15 reps Straight Leg Raises: 20 reps;Limitations Straight Leg Raises Limitations: cueing to improve quad extension lag Other Supine Knee Exercises: clam 10x 10" Other Supine Knee Exercises: Abduction supine with green tband 15xwith therapist under heel to reduce resistance Sidelying Clams: attempted with AAROM- too weak Prone  Hamstring Curl: Limitations Hamstring Curl Limitations: AAROM green tband for resistance with extension (quad strengthening ex) Hip Extension: AAROM;Both;10 reps;Limitations Hip Extension Limitations: with h/s curls pushing   Manual Therapy Manual Therapy: Myofascial release Myofascial Release: Myofascial release (MFR) to bilateral upper arm, anterior and posterior shoulder regions to decrease fascial restrictions and promote improved, pain-free, range of motion.  Physical Therapy Assessment and Plan PT Assessment and Plan Clinical Impression Statement: Resumed mat activtieis for specific hip, quad and hamstring strengtheing exercises with AAROM required  with prone exercises due to gluteal and hamstring weaknesses.  Able to add 5# with SAQ, pt continues to require cueing to reduce extensin lag with SLR due to weakness.  Cueing with functional squats to reduce adduction with improved mechnaics and control noted.  Pt limited by fatigue at end of session.   PT Plan: Continue with functional strengtheing exercises to improve gait mechanics, strength and balance.  Resume mat activities for hip, quad and gluteal strengthening.      Goals PT Short Term Goals PT Short Term Goal 1: Pt to increase Berg balace by 5 to decrease risk of falling PT Short Term Goal 2: Pt to be walking for 10 mintues with rolling walker PT Short Term Goal 3: Pt to be able to stand for a minute with no assistive device to allow easier grooming  PT Short Term Goal 3 - Progress: Progressing toward goal PT Long Term Goals PT Long Term Goal 1: Berg balance to be increased by 10 to allow decreased risk of falling PT Long Term Goal 2: Pt to be ambulating with walker x 30 minutes Long Term Goal 3: Pt to be able to stand without assistive device for five minutes to ease grooming Long Term Goal 4: Pt to be able to stand with walker for 15 minutes for socializing . PT Long Term Goal 5: strength of LE improved one grade to allow the above to occur Long Term Goal 5 Progress: Progressing toward goal  Problem List Patient Active Problem List   Diagnosis Date Noted  . Muscle weakness (generalized) 12/05/2013  . Tight fascia 12/05/2013  . Decreased range of motion of right shoulder 12/05/2013  . Decreased range of motion of left shoulder 12/05/2013  . Bilateral leg weakness 11/21/2013  . Poor balance 11/21/2013  . Difficulty in walking(719.7) 11/21/2013  . Shoulder pain, left 09/12/2013  . Anemia, iron deficiency 09/09/2013  .  Dyspnea 09/07/2013  . UTI (urinary tract infection) 09/07/2013  . Acute CHF (congestive heart failure) 09/07/2013  . Elevated troponin 09/07/2013  . Fever and  chills 09/06/2013  . Altered mental status 09/05/2013  . Neurogenic bowel 08/22/2013  . Cauda equina syndrome with neurogenic bladder 08/22/2013  . Gout flare 08/22/2013  . Postoperative wound infection 08/22/2013  . Lumbar degenerative disc disease 08/02/2013  . Post-operative pain 07/29/2013  . Osteonecrosis 05/05/2013  . Small bowel obstruction 04/17/2013  . Acute renal failure 04/17/2013  . Medial meniscus, posterior horn derangement 02/17/2013  . Knee sprain and strain 02/17/2013  . Trigger point with neck pain 03/18/2012  . Rotator cuff tear arthropathy of right shoulder 09/08/2011  . CAD in native artery 01/09/2011  . Ankle fracture 12/25/2010  . Contusion of shoulder, left 12/25/2010  . PEMPHIGUS VULGARIS 07/02/2010  . MRSA 05/13/2010  . PRURITUS 05/13/2010  . SPINAL STENOSIS, LUMBAR 05/13/2010  . HYPERLIPIDEMIA 04/24/2010  . GASTROESOPHAGEAL REFLUX DISEASE 04/24/2010  . VENTRAL HERNIA, INCISIONAL 04/24/2010  . DEGENERATIVE JOINT DISEASE 04/24/2010  . PYOGENIC ARTHRITIS, SHOULDER REGION 02/04/2010  . HYPERTENSION 01/01/2010  . RUPTURE ROTATOR CUFF 02/19/2009    PT - End of Session Equipment Utilized During Treatment: Gait belt Activity Tolerance: Patient limited by fatigue;Patient tolerated treatment well General Behavior During Therapy: [facility] for tasks assessed/performed  GP    Juel Burrow 01/02/2014, 2:52 PM

## 2014-01-02 NOTE — Evaluation (Addendum)
Occupational Therapy Reassessment and Treatment  Patient Details  Name: [name] MRN: [id number] Date of Birth: 06/08/1941  Today's Date: 01/02/2014 Time: 1345-1430 OT Time Calculation (min): 45 min Therex 1345-1355 10' Reassess 1355-1417 22' MFR 1417-1430 13'  Visit#: 10 of 24  Re-eval: 01/30/14  Assessment Diagnosis: Bilateral Upper Extremity Weakness  Authorization: Medicare  Authorization Time Period: Before 10th visit  Authorization Visit#: 10 of 10   Past Medical History:  Past Medical History  Diagnosis Date  . Hyperlipidemia   . Small bowel problem     HAD ALOT OF SCAR TISSUE FROM PREVIOUS SURGERIES..NG WAS INSERTED .....NO SURGERY NEEDED.....IN FOR 8 DAYS  . Disorder of blood     BEEN TREATED BY DERMATOLOGIST X 4 YRS..."BLOOD BLISTERS"  . Arthritis   . Gout   . Anxiety   . Hx MRSA infection     rt shoulder  . Pneumonia     "I've had it 3-4 times"  . Coronary artery disease     IN 2000   STENT PLACED IN 2012 sees Dr. Rothbart, saw last 2013  . HTN (hypertension)     sees Dr. Hawkins in [place]  . Small bowel obstruction   . Ventral hernia   . Acute renal failure 04/17/2013  . 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 Anemia, iron deficiency 09/09/2013   Past Surgical History:  Past Surgical History  Procedure Laterality Date  . Sternal surg  2000    HAS HAD 5-6 ON HIS STERNUM; "caught MRSA in it"  . Lumbar laminectomy/decompression microdiscectomy  06/13/2011    Procedure: LUMBAR LAMINECTOMY/DECOMPRESSION MICRODISCECTOMY;  Surgeon: Ernesto M Botero;  Location: [facility];  Service: Neurosurgery;  Laterality: N/A;  Lumbar three, lumbar four-five Laminectomy  . Eye surgery      bilateral cataract  . Anterior cervical corpectomy  12/17/11  . Incision and drainage of wound  ~ 20ll; 12/03/11    "had infection in my right"  . Shoulder open rotator cuff repair  ~ 2011    right  . Peripherally inserted central catheter insertion  2011 &  11/2011  . Cataract extraction w/ intraocular lens  implant, bilateral  ? 2011  . Coronary artery bypass graft  2000    CABG X5  . Back surgery      lumbar  . Tonsillectomy  1949  . Cholecystectomy  2006 "or after"  . Coronary angioplasty with stent placement  2012  . Anterior cervical corpectomy  12/17/2011    Procedure: ANTERIOR CERVICAL CORPECTOMY;  Surgeon: Ernesto M Botero, MD;  Location: [facility];  Service: Neurosurgery;  Laterality: N/A;  Anterior Cervical Decompression Fusion Five to Thoracic Two with plating  . Lumbar laminectomy/decompression microdiscectomy Right 06/17/2013    Procedure: LUMBAR LAMINECTOMY/DECOMPRESSION MICRODISCECTOMY 1 LEVEL;  Surgeon: Ernesto M Botero, MD;  Location: [facility];  Service: Neurosurgery;  Laterality: Right;  Right L3-4 Microdiskectomy  . Lumbar wound debridement N/A 08/02/2013    Procedure: INCISION AND DRAINAGE OF LUMBAR WOUND DEBRIDEMENT;  Surgeon: Ernesto M Botero, MD;  Location: [facility];  Service: Neurosurgery;  Laterality: N/A;  . Shoulder arthroscopy Left 09/13/2013    Procedure: ARTHROSCOPIC IRRIGATION AND DEBRIDEMENT, SYNOVECTOMY ,  LEFT SHOULDER ;  Surgeon: W D Caffrey Jr., MD;  Location: [facility];  Service: Orthopedics;  Laterality: Left;    Subjective Pain Assessment Currently in Pain?: No/denies  Precautions/Restrictions  Precautions Precautions: Back  Assessment Additional Assessments RUE AROM (degrees) RUE Overall   AROM Comments: Assessed in seated/supine, with ER/IR shoulder adducted Right Shoulder Flexion:  (Today: 150/165 (last progress note:36/153) Right Shoulder ABduction:  (Todat: 66/151 (last progress note: 35/35)) Right Shoulder Internal Rotation:  (Today: 90/90 (last progress note: 90/84)) Right Shoulder External Rotation:  (Today: 80/90 (last progress note: 42/81)) RUE Strength RUE Overall Strength Comments: Assess in sitting, with shoulder adducted ER/IR Right Shoulder Flexion:  (3-/5 (same at last progress  note)) Right Shoulder ABduction:  (3-/5 (same at last progress note)) Right Shoulder Internal Rotation:  (4-/5 (same at last progress note)) Right Shoulder External Rotation:  (4-/5 (last progress note: 3+/5)) Grip (lbs): 65 (last progress note: 46) LUE AROM (degrees) LUE Overall AROM Comments: Assessed in sitting/supine, with shoulder adducted ER/IR Left Shoulder Flexion:  (Today: 63/156 (last progress note: 30/43)) Left Shoulder ABduction:  (Today: 65/67 (last progress note: 34/21)) Left Shoulder Internal Rotation:  (Today: 90/90 (last progress note: 73/71)) Left Shoulder External Rotation:  (Today: 85/90 (last progress note: 62/72)) LUE Strength LUE Overall Strength Comments: Assessed in sitting Left Shoulder Flexion: 3-/5 (same) Left Shoulder ABduction: 3-/5 (same) Left Shoulder Internal Rotation:  (4-/5 (last progress note: 3+/5)) Left Shoulder External Rotation:  (4-/5 (last progress note: 3+/5)) Grip (lbs): 60 (last progress note: 39) Palpation Palpation: Min fascial restrictions on bilateral biceps, upper arms, trapezius and scapularis region.      Exercise/Treatments ROM / Strengthening / Isometric Strengthening Other ROM/Strengthening Exercises: Nustep to increase BUE strength and endurance; level 4, Hills profile 5; 10 minutes      Manual Therapy Manual Therapy: Myofascial release Myofascial Release: Myofascial release (MFR) to bilateral upper arm, anterior and posterior shoulder regions to decrease fascial restrictions and promote improved, pain-free, range of motion.  Occupational Therapy Assessment and Plan OT Assessment and Plan Clinical Impression Statement: A: Reassessment completed this date. Patient met 4/5 STGs and 2/6 LTGs. Patient is making steady progress towards goals.  OT Plan: P: Update G-code!!!! Cont to work on increasing strength through 90 degree range.   Goals Short Term Goals Short Term Goal 1: Pt will be educated on HEP Short Term Goal 1  Progress: Met Short Term Goal 2: Pt will achieve BUE shoulder AROM WFL in supine in working towards improving his golf swing. Short Term Goal 2 Progress: Met Short Term Goal 3: Pt will improve BUE strength to at least 4-/5 for improved ability to reach for with shaving supplies during morning ADLs. Short Term Goal 3 Progress: Progressing toward goal Short Term Goal 4: Pt will have decreased fascial restrictions to min-mod in BUE for decrased pain with motion. Short Term Goal 4 Progress: Met Short Term Goal 5: Pt will improve bilateral grip strength by at least 5# each. Short Term Goal 5 Progress: Met Long Term Goals Long Term Goal 1: Pt will achieve highest level of functioning with all ADLs, IADLs, and leisure tasks when using BUE. Long Term Goal 1 Progress: Progressing toward goal Long Term Goal 2: Pt will achieve BUE shoulder AROM WFL in standing in working towards improving his golf swing. Long Term Goal 2 Progress: Progressing toward goal Long Term Goal 3: Pt will improve BUE shoulder strength to at least 4/5 for improved ability to reach for items off of shelves. Long Term Goal 3 Progress: Progressing toward goal Long Term Goal 4: Pt will have decreased fascial restictions to trace-min in BUE. Long Term Goal 4 Progress: Progressing toward goal Long Term Goal 5: Pt will improve bilateral grip strength by at least 10# each. Long Term  Goal 5 Progress: Met Long Term Goal 6: Pt will verbalize less than 1/10 pain in his shoulder with movement. Long Term Goal 6 Progress: Met  Problem List Patient Active Problem List   Diagnosis Date Noted  . Muscle weakness (generalized) 12/05/2013  . Tight fascia 12/05/2013  . Decreased range of motion of right shoulder 12/05/2013  . Decreased range of motion of left shoulder 12/05/2013  . Bilateral leg weakness 11/21/2013  . Poor balance 11/21/2013  . Difficulty in walking(719.7) 11/21/2013  . Shoulder pain, left 09/12/2013  . Anemia, iron  deficiency 09/09/2013  . Dyspnea 09/07/2013  . UTI (urinary tract infection) 09/07/2013  . Acute CHF (congestive heart failure) 09/07/2013  . Elevated troponin 09/07/2013  . Fever and chills 09/06/2013  . Altered mental status 09/05/2013  . Neurogenic bowel 08/22/2013  . Cauda equina syndrome with neurogenic bladder 08/22/2013  . Gout flare 08/22/2013  . Postoperative wound infection 08/22/2013  . Lumbar degenerative disc disease 08/02/2013  . Post-operative pain 07/29/2013  . Osteonecrosis 05/05/2013  . Small bowel obstruction 04/17/2013  . Acute renal failure 04/17/2013  . Medial meniscus, posterior horn derangement 02/17/2013  . Knee sprain and strain 02/17/2013  . Trigger point with neck pain 03/18/2012  . Rotator cuff tear arthropathy of right shoulder 09/08/2011  . CAD in native artery 01/09/2011  . Ankle fracture 12/25/2010  . Contusion of shoulder, left 12/25/2010  . PEMPHIGUS VULGARIS 07/02/2010  . MRSA 05/13/2010  . PRURITUS 05/13/2010  . SPINAL STENOSIS, LUMBAR 05/13/2010  . HYPERLIPIDEMIA 04/24/2010  . GASTROESOPHAGEAL REFLUX DISEASE 04/24/2010  . VENTRAL HERNIA, INCISIONAL 04/24/2010  . DEGENERATIVE JOINT DISEASE 04/24/2010  . PYOGENIC ARTHRITIS, SHOULDER REGION 02/04/2010  . HYPERTENSION 01/01/2010  . RUPTURE ROTATOR CUFF 02/19/2009    End of Session Activity Tolerance: Patient tolerated treatment well General Behavior During Therapy: WFL for tasks assessed/performed    01/02/14 0845  OT G-codes  Functional Assessment Tool Used Dash was 54% impaired and is currently 29% impaired   Functional Limitation Carrying, moving and handling objects  Carrying, Moving and Handling Objects Current Status (G8984) CJ  Carrying, Moving and Handling Objects Goal Status (G8985) CI     , OTR/L,CBIS   01/02/2014, 2:47 PM  Physician Documentation Your signature is required to indicate approval of the treatment plan as stated above.  Please sign and either  send electronically or make a copy of this report for your files and return this physician signed original.  Please mark one 1.__approve of plan  2. ___approve of plan with the following conditions.   ______________________________                                                          _____________________ Physician Signature                                                                                                               Date

## 2014-01-04 ENCOUNTER — Inpatient Hospital Stay (HOSPITAL_COMMUNITY): Admission: RE | Admit: 2014-01-04 | Payer: Medicare Other | Source: Ambulatory Visit | Admitting: Specialist

## 2014-01-06 ENCOUNTER — Ambulatory Visit (HOSPITAL_COMMUNITY)
Admission: RE | Admit: 2014-01-06 | Discharge: 2014-01-06 | Disposition: A | Discharge status: Home / Self Care | Payer: Medicare Other | Source: Ambulatory Visit | Attending: Pulmonary Disease | Admitting: Pulmonary Disease

## 2014-01-06 DIAGNOSIS — IMO0001 Reserved for inherently not codable concepts without codable children: Secondary | ICD-10-CM | POA: Diagnosis not present

## 2014-01-06 DIAGNOSIS — M629 Disorder of muscle, unspecified: Secondary | ICD-10-CM

## 2014-01-06 DIAGNOSIS — M25612 Stiffness of left shoulder, not elsewhere classified: Secondary | ICD-10-CM

## 2014-01-06 DIAGNOSIS — R2689 Other abnormalities of gait and mobility: Secondary | ICD-10-CM

## 2014-01-06 DIAGNOSIS — R29898 Other symptoms and signs involving the musculoskeletal system: Secondary | ICD-10-CM

## 2014-01-06 DIAGNOSIS — M25611 Stiffness of right shoulder, not elsewhere classified: Secondary | ICD-10-CM

## 2014-01-06 DIAGNOSIS — R339 Retention of urine, unspecified: Secondary | ICD-10-CM | POA: Diagnosis not present

## 2014-01-06 DIAGNOSIS — R262 Difficulty in walking, not elsewhere classified: Secondary | ICD-10-CM

## 2014-01-06 DIAGNOSIS — N319 Neuromuscular dysfunction of bladder, unspecified: Secondary | ICD-10-CM | POA: Diagnosis not present

## 2014-01-06 DIAGNOSIS — M6289 Other specified disorders of muscle: Secondary | ICD-10-CM

## 2014-01-06 DIAGNOSIS — M6281 Muscle weakness (generalized): Secondary | ICD-10-CM

## 2014-01-06 NOTE — Progress Notes (Signed)
Occupational Therapy Treatment Patient Details  Name: [name] MRN: [id number] Date of Birth: 12/08/1940  Today's Date: 01/06/2014 Time: 4098-11911438-1515 OT Time Calculation (min): 37 min Manual therapy [id number] 22' Therapeutic exercises 1500-1515 15'  Visit#: 11 of 24  Re-eval: 01/30/14    Authorization: Medicare  Authorization Time Period: before 20th visit   Authorization Visit#: 11 of 20  Subjective  S:  I can do some better with my arm. Special Tests: DASH 29.54 with ideal score being 0 Pain Assessment Currently in Pain?: No/denies  Precautions/Restrictions   progress as tolerated  Exercise/Treatments Supine Protraction: Both;PROM;5 reps;AROM;10 reps Horizontal ABduction: Both;PROM;5 reps;AROM;10 reps External Rotation: Both;PROM;5 reps;AROM;10 reps Internal Rotation: Both;PROM;5 reps;AROM;10 reps Flexion: Both;PROM;5 reps;AROM;10 reps;Limitations Flexion Limitations: fingertip faciliation for left ABduction: Both;PROM;5 reps;AROM;10 reps;Limitations ABduction Limitations: fingertip faciliation bilaterally Seated Extension: Theraband;10 reps Theraband Level (Shoulder Extension): Level 2 (Red) Row: Theraband;10 reps Theraband Level (Shoulder Row): Level 2 (Red) Protraction: AROM;10 reps External Rotation: AROM;Theraband;10 reps Theraband Level (Shoulder External Rotation): Level 2 (Red) Internal Rotation: AROM;Theraband;10 reps Theraband Level (Shoulder Internal Rotation): Level 2 (Red) Flexion: AROM;10 reps (held hands together in midline, min pa from OT) Abduction:  (elbows flexed to 90 abducted to 90 min fac with left)     Manual Therapy Manual Therapy: Myofascial release Myofascial Release: Myofascial release (MFR) to bilateral upper arm, anterior and posterior shoulder region to decrease fascial restrictions and promotote improved pain free range of motion.  Occupational Therapy Assessment and Plan OT Assessment and Plan Clinical Impression Statement:  A: added theraband for scapular stability in seated.   OT Plan: P:  Improve independence with functional reaching to shoulder height for increased independence with daily tasks.    Goals Short Term Goals Short Term Goal 1: Pt will be educated on HEP Short Term Goal 2: Pt will achieve BUE shoulder AROM WFL in supine in working towards improving his golf swing. Short Term Goal 3: Pt will improve BUE strength to at least 4-/5 for improved ability to reach for with shaving supplies during morning ADLs. Short Term Goal 4: Pt will have decreased fascial restrictions to min-mod in BUE for decrased pain with motion. Short Term Goal 5: Pt will improve bilateral grip strength by at least 5# each. Long Term Goals Long Term Goal 1: Pt will achieve highest level of functioning with all ADLs, IADLs, and leisure tasks when using BUE. Long Term Goal 2: Pt will achieve BUE shoulder AROM WFL in standing in working towards improving his golf swing. Long Term Goal 3: Pt will improve BUE shoulder strength to at least 4/5 for improved ability to reach for items off of shelves. Long Term Goal 4: Pt will have decreased fascial restictions to trace-min in BUE. Long Term Goal 5: Pt will improve bilateral grip strength by at least 10# each. Long Term Goal 6: Pt will verbalize less than 1/10 pain in his shoulder with movement.  Problem List Patient Active Problem List   Diagnosis Date Noted  . Muscle weakness (generalized) 12/05/2013  . Tight fascia 12/05/2013  . Decreased range of motion of right shoulder 12/05/2013  . Decreased range of motion of left shoulder 12/05/2013  . Bilateral leg weakness 11/21/2013  . Poor balance 11/21/2013  . Difficulty in walking(719.7) 11/21/2013  . Shoulder pain, left 09/12/2013  . Anemia, iron deficiency 09/09/2013  . Dyspnea 09/07/2013  . UTI (urinary tract infection) 09/07/2013  . Acute CHF (congestive heart failure) 09/07/2013  . Elevated troponin 09/07/2013  . Fever and  chills 09/06/2013  . Altered mental status 09/05/2013  . Neurogenic bowel 08/22/2013  . Cauda equina syndrome with neurogenic bladder 08/22/2013  . Gout flare 08/22/2013  . Postoperative wound infection 08/22/2013  . Lumbar degenerative disc disease 08/02/2013  . Post-operative pain 07/29/2013  . Osteonecrosis 05/05/2013  . Small bowel obstruction 04/17/2013  . Acute renal failure 04/17/2013  . Medial meniscus, posterior horn derangement 02/17/2013  . Knee sprain and strain 02/17/2013  . Trigger point with neck pain 03/18/2012  . Rotator cuff tear arthropathy of right shoulder 09/08/2011  . CAD in native artery 01/09/2011  . Ankle fracture 12/25/2010  . Contusion of shoulder, left 12/25/2010  . PEMPHIGUS VULGARIS 07/02/2010  . MRSA 05/13/2010  . PRURITUS 05/13/2010  . SPINAL STENOSIS, LUMBAR 05/13/2010  . HYPERLIPIDEMIA 04/24/2010  . GASTROESOPHAGEAL REFLUX DISEASE 04/24/2010  . VENTRAL HERNIA, INCISIONAL 04/24/2010  . DEGENERATIVE JOINT DISEASE 04/24/2010  . PYOGENIC ARTHRITIS, SHOULDER REGION 02/04/2010  . HYPERTENSION 01/01/2010  . RUPTURE ROTATOR CUFF 02/19/2009    End of Session Activity Tolerance: Patient tolerated treatment well General Behavior During Therapy: [facility]FL for tasks assessed/performed  GO Functional Assessment Tool Used: Dash was 54% impaired and is currently 29% impaired   Shirlean MylarBethany H. Yannick Steuber, OTR/L [phone]  01/06/2014, 3:53 PM

## 2014-01-06 NOTE — Progress Notes (Signed)
Physical Therapy Treatment Patient Details  Name: Jake MarvelRonald W Coletti MRN: [id number] Date of Birth: 09/18/1940  Today's Date: 01/06/2014 Time: 4098-11911348-1430 PT Time Calculation (min): 42 min Charge:  There ex 1348-1430  Visit#: 17 of 24  Re-eval: 01/18/14    Authorization: Medicare  Authorization Visit#: 17 of 20   Subjective: Symptoms/Limitations Symptoms: Pt states he is not feeling well he had to get up early to go to MD and had various tests ran which were uncomfortable.  Pt requests not to do any standing activites  Special Tests: DASH 29.54 with ideal score being 0 Pain Assessment Currently in Pain?: No/denies Pain Score: 6  Pain Location: Back Pain Orientation: Lower      Exercise/Treatments Seated Long Arc Quad: Both;15 reps;Weights Long Arc Quad Weight: 7 lbs. Supine Bridges: 15 reps;Limitations Bridges Limitations: once up shift rt hip up then lt then return to resting postion  Straight Leg Raises: 15 reps;Both Sidelying Hip ABduction: AAROM;10 reps Clams: AAROM x 10 Prone  Hamstring Curl: 10 reps Hip Extension: Limitations;10 reps Hip Extension Limitations: with knee bent to 90 degrees  Other Prone Exercises: heel squeezes    Manual Therapy Manual Therapy: Myofascial release Myofascial Release: Myofascial release (MFR) to bilateral upper arm, anterior and posterior shoulder region to decrease fascial restrictions and promotote improved pain free range of motion.  Physical Therapy Assessment and Plan PT Assessment and Plan Clinical Impression Statement: Pt requested to do mat activities today.  Pt hip mm remain very weak with assistance needed for Abduction and extension.  All exercises facilitated by therapist to ensure proper form and technique. PT Plan: resume standing activites     Goals    Problem List Patient Active Problem List   Diagnosis Date Noted  . Muscle weakness (generalized) 12/05/2013  . Tight fascia 12/05/2013  . Decreased range of  motion of right shoulder 12/05/2013  . Decreased range of motion of left shoulder 12/05/2013  . Bilateral leg weakness 11/21/2013  . Poor balance 11/21/2013  . Difficulty in walking(719.7) 11/21/2013  . Shoulder pain, left 09/12/2013  . Anemia, iron deficiency 09/09/2013  . Dyspnea 09/07/2013  . UTI (urinary tract infection) 09/07/2013  . Acute CHF (congestive heart failure) 09/07/2013  . Elevated troponin 09/07/2013  . Fever and chills 09/06/2013  . Altered mental status 09/05/2013  . Neurogenic bowel 08/22/2013  . Cauda equina syndrome with neurogenic bladder 08/22/2013  . Gout flare 08/22/2013  . Postoperative wound infection 08/22/2013  . Lumbar degenerative disc disease 08/02/2013  . Post-operative pain 07/29/2013  . Osteonecrosis 05/05/2013  . Small bowel obstruction 04/17/2013  . Acute renal failure 04/17/2013  . Medial meniscus, posterior horn derangement 02/17/2013  . Knee sprain and strain 02/17/2013  . Trigger point with neck pain 03/18/2012  . Rotator cuff tear arthropathy of right shoulder 09/08/2011  . CAD in native artery 01/09/2011  . Ankle fracture 12/25/2010  . Contusion of shoulder, left 12/25/2010  . PEMPHIGUS VULGARIS 07/02/2010  . MRSA 05/13/2010  . PRURITUS 05/13/2010  . SPINAL STENOSIS, LUMBAR 05/13/2010  . HYPERLIPIDEMIA 04/24/2010  . GASTROESOPHAGEAL REFLUX DISEASE 04/24/2010  . VENTRAL HERNIA, INCISIONAL 04/24/2010  . DEGENERATIVE JOINT DISEASE 04/24/2010  . PYOGENIC ARTHRITIS, SHOULDER REGION 02/04/2010  . HYPERTENSION 01/01/2010  . RUPTURE ROTATOR CUFF 02/19/2009    General Behavior During Therapy: [facility]WFL for tasks assessed/performed  GP    Darean Rote,CINDY 01/06/2014, 5:27 PM

## 2014-01-09 ENCOUNTER — Ambulatory Visit (HOSPITAL_COMMUNITY)
Admission: RE | Admit: 2014-01-09 | Discharge: 2014-01-09 | Disposition: A | Discharge status: Home / Self Care | Payer: Medicare Other | Source: Ambulatory Visit | Attending: Pulmonary Disease | Admitting: Pulmonary Disease

## 2014-01-09 DIAGNOSIS — M6289 Other specified disorders of muscle: Secondary | ICD-10-CM

## 2014-01-09 DIAGNOSIS — M629 Disorder of muscle, unspecified: Secondary | ICD-10-CM

## 2014-01-09 DIAGNOSIS — M25612 Stiffness of left shoulder, not elsewhere classified: Secondary | ICD-10-CM

## 2014-01-09 DIAGNOSIS — IMO0001 Reserved for inherently not codable concepts without codable children: Secondary | ICD-10-CM | POA: Diagnosis not present

## 2014-01-09 DIAGNOSIS — M6281 Muscle weakness (generalized): Secondary | ICD-10-CM

## 2014-01-09 DIAGNOSIS — M25611 Stiffness of right shoulder, not elsewhere classified: Secondary | ICD-10-CM

## 2014-01-09 NOTE — Progress Notes (Signed)
Occupational Therapy Treatment Patient Details  Name: Jake MarvelRonald W Dillinger MRN: [id number] Date of Birth: 03/17/1941  Today's Date: 01/09/2014 Time: 1515-1600 OT Time Calculation (min): 45 min Therex 5621-30861515-1525 10' MFR 5784-69621525-1541 16' Therex 9528-41321541-1600 19'  Visit#: 12 of 24  Re-eval: 01/30/14    Authorization: Medicare  Authorization Time Period: before 20th visit   Authorization Visit#: 12 of 20  Subjective Symptoms/Limitations Symptoms: S: My shoulders are crunching today. Maybe it's from walking so much and putting weight on my arms.  Pain Assessment Currently in Pain?: No/denies  Precautions/Restrictions  Precautions Precautions: Back  Exercise/Treatments Supine Protraction: Both;PROM;5 reps;AROM;10 reps Horizontal ABduction: Both;PROM;5 reps;AROM;10 reps External Rotation: Both;PROM;5 reps;AROM;10 reps Internal Rotation: Both;PROM;5 reps;AROM;10 reps Flexion: Both;PROM;5 reps;AROM;10 reps;Limitations ABduction: Both;PROM;5 reps;AROM;10 reps;Limitations Seated Extension: Theraband;10 reps Theraband Level (Shoulder Extension): Level 2 (Red) Retraction: Theraband;10 reps Theraband Level (Shoulder Retraction): Level 2 (Red) Row: Theraband;10 reps Theraband Level (Shoulder Row): Level 2 (Red) Protraction: AROM;10 reps Horizontal ABduction: AROM;10 reps Horizontal ABduction Limitations: unable to keep shoulder at 90 degrees External Rotation: AROM;10 reps Internal Rotation: AROM;10 reps Flexion: AROM;10 reps Abduction: AAROM;10 reps;Limitations ABduction Limitations: therapist assisting ROM / Strengthening / Isometric Strengthening Wall Wash: 1' each arm Rhythmic Stabilization, Supine: 10X Other ROM/Strengthening Exercises: Nustep to increase BUE strength and endurance; level 4, [place] profile 5; 10 minutes      Manual Therapy Manual Therapy: Myofascial release Myofascial Release: Myofascial release (MFR) to bilateral upper arm, anterior and posterior shoulder region  to decrease fascial restrictions and promotote improved pain free range of motion  Occupational Therapy Assessment and Plan OT Assessment and Plan Clinical Impression Statement: A: Added proximal shoulder strengthening. Patient tolerated well. Continues to have difficulty with abduction with left arm supine and seated and right arm seated. OT Plan: P: Cont to work on increasing shoulder strength and stability to increase independence with reaching at shoulder height.    Goals Short Term Goals Short Term Goal 1: Pt will be educated on HEP Short Term Goal 2: Pt will achieve BUE shoulder AROM WFL in supine in working towards improving his golf swing. Short Term Goal 3: Pt will improve BUE strength to at least 4-/5 for improved ability to reach for with shaving supplies during morning ADLs. Short Term Goal 4: Pt will have decreased fascial restrictions to min-mod in BUE for decrased pain with motion. Short Term Goal 5: Pt will improve bilateral grip strength by at least 5# each. Long Term Goals Long Term Goal 1: Pt will achieve highest level of functioning with all ADLs, IADLs, and leisure tasks when using BUE. Long Term Goal 1 Progress: Progressing toward goal Long Term Goal 2: Pt will achieve BUE shoulder AROM WFL in standing in working towards improving his golf swing. Long Term Goal 2 Progress: Progressing toward goal Long Term Goal 3: Pt will improve BUE shoulder strength to at least 4/5 for improved ability to reach for items off of shelves. Long Term Goal 3 Progress: Progressing toward goal Long Term Goal 4: Pt will have decreased fascial restictions to trace-min in BUE. Long Term Goal 4 Progress: Progressing toward goal Long Term Goal 5: Pt will improve bilateral grip strength by at least 10# each. Long Term Goal 6: Pt will verbalize less than 1/10 pain in his shoulder with movement.  Problem List Patient Active Problem List   Diagnosis Date Noted  . Muscle weakness (generalized)  12/05/2013  . Tight fascia 12/05/2013  . Decreased range of motion of right shoulder 12/05/2013  . Decreased  range of motion of left shoulder 12/05/2013  . Bilateral leg weakness 11/21/2013  . Poor balance 11/21/2013  . Difficulty in walking(719.7) 11/21/2013  . Shoulder pain, left 09/12/2013  . Anemia, iron deficiency 09/09/2013  . Dyspnea 09/07/2013  . UTI (urinary tract infection) 09/07/2013  . Acute CHF (congestive heart failure) 09/07/2013  . Elevated troponin 09/07/2013  . Fever and chills 09/06/2013  . Altered mental status 09/05/2013  . Neurogenic bowel 08/22/2013  . Cauda equina syndrome with neurogenic bladder 08/22/2013  . Gout flare 08/22/2013  . Postoperative wound infection 08/22/2013  . Lumbar degenerative disc disease 08/02/2013  . Post-operative pain 07/29/2013  . Osteonecrosis 05/05/2013  . Small bowel obstruction 04/17/2013  . Acute renal failure 04/17/2013  . Medial meniscus, posterior horn derangement 02/17/2013  . Knee sprain and strain 02/17/2013  . Trigger point with neck pain 03/18/2012  . Rotator cuff tear arthropathy of right shoulder 09/08/2011  . CAD in native artery 01/09/2011  . Ankle fracture 12/25/2010  . Contusion of shoulder, left 12/25/2010  . PEMPHIGUS VULGARIS 07/02/2010  . MRSA 05/13/2010  . PRURITUS 05/13/2010  . SPINAL STENOSIS, LUMBAR 05/13/2010  . HYPERLIPIDEMIA 04/24/2010  . GASTROESOPHAGEAL REFLUX DISEASE 04/24/2010  . VENTRAL HERNIA, INCISIONAL 04/24/2010  . DEGENERATIVE JOINT DISEASE 04/24/2010  . PYOGENIC ARTHRITIS, SHOULDER REGION 02/04/2010  . HYPERTENSION 01/01/2010  . RUPTURE ROTATOR CUFF 02/19/2009    End of Session Activity Tolerance: Patient tolerated treatment well General Behavior During Therapy: [facility] tasks assessed/performed   Limmie PatriciaLaura Hien Cunliffe, OTR/L,CBIS   01/09/2014, 4:09 PM

## 2014-01-09 NOTE — Progress Notes (Signed)
Physical Therapy Treatment Patient Details  Name: [name] MRN: [id number] Date of Birth: 09/05/1940  Today's Date: 01/09/2014 Time: 9629-52841430-1515 PT Time Calculation (min): 45 min Visit#: 18 of 24  Re-eval: 01/18/14 Authorization: Medicare  Authorization Visit#: 18 of 20  Charges:  therex 42  Subjective: Symptoms/Limitations Symptoms: Pt states he is doing well today and feeling stronger than last visit.  REports compliance with HEP.   Exercise/Treatments Aerobic Stationary Bike: NuStep x10' L5 hills #4 to improve strength and activity tolerance; SPM 140 OT time Standing Functional Squat: 20 reps Seated Other Seated Knee Exercises: 5 STS with min assistance Supine Short Arc Quad Sets: Both;15 reps;Limitations Short Arc Quad Sets Limitations: 5# Bridges: 15 reps;Limitations Straight Leg Raises: 15 reps;Both Sidelying Hip ABduction: AAROM;10 reps Prone  Hamstring Curl: 10 reps Hip Extension: 10 reps Hip Extension Limitations: with knee bent to 90 degrees  Other Prone Exercises: reverse quad sets 20 reps     Physical Therapy Assessment and Plan PT Assessment and Plan Clinical Impression Statement: Resumed standing squats and STS activity today.  Pt requires therapist facilitation to isolate correct muscles and with controlled movements.  Encouarged to complete nustep with mostly LE's to help increase strength and activity tolerance.   PT Plan: continue to progress functional strength and mobility.  Progress to LRAD.     Problem List Patient Active Problem List   Diagnosis Date Noted  . Muscle weakness (generalized) 12/05/2013  . Tight fascia 12/05/2013  . Decreased range of motion of right shoulder 12/05/2013  . Decreased range of motion of left shoulder 12/05/2013  . Bilateral leg weakness 11/21/2013  . Poor balance 11/21/2013  . Difficulty in walking(719.7) 11/21/2013  . Shoulder pain, left 09/12/2013  . Anemia, iron deficiency 09/09/2013  . Dyspnea  09/07/2013  . UTI (urinary tract infection) 09/07/2013  . Acute CHF (congestive heart failure) 09/07/2013  . Elevated troponin 09/07/2013  . Fever and chills 09/06/2013  . Altered mental status 09/05/2013  . Neurogenic bowel 08/22/2013  . Cauda equina syndrome with neurogenic bladder 08/22/2013  . Gout flare 08/22/2013  . Postoperative wound infection 08/22/2013  . Lumbar degenerative disc disease 08/02/2013  . Post-operative pain 07/29/2013  . Osteonecrosis 05/05/2013  . Small bowel obstruction 04/17/2013  . Acute renal failure 04/17/2013  . Medial meniscus, posterior horn derangement 02/17/2013  . Knee sprain and strain 02/17/2013  . Trigger point with neck pain 03/18/2012  . Rotator cuff tear arthropathy of right shoulder 09/08/2011  . CAD in native artery 01/09/2011  . Ankle fracture 12/25/2010  . Contusion of shoulder, left 12/25/2010  . PEMPHIGUS VULGARIS 07/02/2010  . MRSA 05/13/2010  . PRURITUS 05/13/2010  . SPINAL STENOSIS, LUMBAR 05/13/2010  . HYPERLIPIDEMIA 04/24/2010  . GASTROESOPHAGEAL REFLUX DISEASE 04/24/2010  . VENTRAL HERNIA, INCISIONAL 04/24/2010  . DEGENERATIVE JOINT DISEASE 04/24/2010  . PYOGENIC ARTHRITIS, SHOULDER REGION 02/04/2010  . HYPERTENSION 01/01/2010  . RUPTURE ROTATOR CUFF 02/19/2009       Lurena NidaAmy B Frazier, PTA/CLT 01/09/2014, 3:14 PM

## 2014-01-11 ENCOUNTER — Ambulatory Visit (HOSPITAL_COMMUNITY)
Admission: RE | Admit: 2014-01-11 | Discharge: 2014-01-11 | Disposition: A | Discharge status: Home / Self Care | Payer: Medicare Other | Source: Ambulatory Visit | Attending: Pulmonary Disease | Admitting: Pulmonary Disease

## 2014-01-11 DIAGNOSIS — IMO0001 Reserved for inherently not codable concepts without codable children: Secondary | ICD-10-CM | POA: Diagnosis not present

## 2014-01-11 DIAGNOSIS — M25612 Stiffness of left shoulder, not elsewhere classified: Secondary | ICD-10-CM

## 2014-01-11 DIAGNOSIS — M6289 Other specified disorders of muscle: Secondary | ICD-10-CM

## 2014-01-11 DIAGNOSIS — M6281 Muscle weakness (generalized): Secondary | ICD-10-CM

## 2014-01-11 DIAGNOSIS — M629 Disorder of muscle, unspecified: Secondary | ICD-10-CM

## 2014-01-11 DIAGNOSIS — M25611 Stiffness of right shoulder, not elsewhere classified: Secondary | ICD-10-CM

## 2014-01-11 NOTE — Progress Notes (Signed)
Occupational Therapy Treatment Patient Details  Name: [name] MRN: [id number] Date of Birth: 07/17/1941  Today's Date: 01/11/2014 Time: 8657-84691524-1610 OT Time Calculation (min): 46 min MFR 6295-28411524-1533 9' Therex 3244-01021533-1610 37'  Visit#: 13 of 24  Re-eval: 01/30/14    Authorization: Medicare  Authorization Time Period: before 20th visit   Authorization Visit#: 13 of 20  Subjective Symptoms/Limitations Symptoms: S: The doctor told me that I wouldn't get all my strength back because of how much they had to cut out. Pain Assessment Currently in Pain?: No/denies  Precautions/Restrictions  Precautions Precautions: Back  Exercise/Treatments Supine Protraction: Both;PROM;5 reps;AROM;15 reps Horizontal ABduction: Both;PROM;5 reps;AROM;10 reps External Rotation: Both;PROM;5 reps;AROM;10 reps Internal Rotation: Both;PROM;5 reps;AROM;10 reps Flexion: Both;PROM;5 reps;AROM;10 reps;Limitations Flexion Limitations: fingertip faciliation for left ABduction: Both;PROM;5 reps;AROM;10 reps;Limitations ABduction Limitations: fingertip faciliation on left Seated Protraction: AROM;10 reps Horizontal ABduction: AROM;10 reps External Rotation: AROM;10 reps Internal Rotation: AROM;10 reps Flexion: AROM;10 reps Flexion Limitations: facilitation to reach past 90 degrees on left  Abduction: AAROM;10 reps Prone  Other Prone Exercises: [place] exercises completed each arm completed individually. No ER/IR completed this date. ROM / Strengthening / Isometric Strengthening UBE (Upper Arm Bike): Level 3 3' forward 3' reverse Wall Wash: 1' each arm Rhythmic Stabilization, Supine: 10X      Manual Therapy Manual Therapy: Myofascial release Myofascial Release: Myofascial release (MFR) to bilateral upper arm, anterior and posterior shoulder region to decrease fascial restrictions and promotote improved pain free range of motion  Occupational Therapy Assessment and Plan OT Assessment and  Plan Clinical Impression Statement: A: Patient showed slight improvement with shoulder strengthening this date. Continues to require some assistance for left UE. Added prone exercises. Prone was difficult and patient tolerated well.  OT Plan: P: Complete MFR PRN. Add proximal shoulder strengthening seated.   Goals Short Term Goals Short Term Goal 1: Pt will be educated on HEP Short Term Goal 2: Pt will achieve BUE shoulder AROM WFL in supine in working towards improving his golf swing. Short Term Goal 3: Pt will improve BUE strength to at least 4-/5 for improved ability to reach for with shaving supplies during morning ADLs. Short Term Goal 4: Pt will have decreased fascial restrictions to min-mod in BUE for decrased pain with motion. Short Term Goal 5: Pt will improve bilateral grip strength by at least 5# each. Long Term Goals Long Term Goal 1: Pt will achieve highest level of functioning with all ADLs, IADLs, and leisure tasks when using BUE. Long Term Goal 2: Pt will achieve BUE shoulder AROM WFL in standing in working towards improving his golf swing. Long Term Goal 3: Pt will improve BUE shoulder strength to at least 4/5 for improved ability to reach for items off of shelves. Long Term Goal 4: Pt will have decreased fascial restictions to trace-min in BUE. Long Term Goal 5: Pt will improve bilateral grip strength by at least 10# each. Long Term Goal 6: Pt will verbalize less than 1/10 pain in his shoulder with movement.  Problem List Patient Active Problem List   Diagnosis Date Noted  . Muscle weakness (generalized) 12/05/2013  . Tight fascia 12/05/2013  . Decreased range of motion of right shoulder 12/05/2013  . Decreased range of motion of left shoulder 12/05/2013  . Bilateral leg weakness 11/21/2013  . Poor balance 11/21/2013  . Difficulty in walking(719.7) 11/21/2013  . Shoulder pain, left 09/12/2013  . Anemia, iron deficiency 09/09/2013  . Dyspnea 09/07/2013  . UTI (urinary  tract infection) 09/07/2013  .  Acute CHF (congestive heart failure) 09/07/2013  . Elevated troponin 09/07/2013  . Fever and chills 09/06/2013  . Altered mental status 09/05/2013  . Neurogenic bowel 08/22/2013  . Cauda equina syndrome with neurogenic bladder 08/22/2013  . Gout flare 08/22/2013  . Postoperative wound infection 08/22/2013  . Lumbar degenerative disc disease 08/02/2013  . Post-operative pain 07/29/2013  . Osteonecrosis 05/05/2013  . Small bowel obstruction 04/17/2013  . Acute renal failure 04/17/2013  . Medial meniscus, posterior horn derangement 02/17/2013  . Knee sprain and strain 02/17/2013  . Trigger point with neck pain 03/18/2012  . Rotator cuff tear arthropathy of right shoulder 09/08/2011  . CAD in native artery 01/09/2011  . Ankle fracture 12/25/2010  . Contusion of shoulder, left 12/25/2010  . PEMPHIGUS VULGARIS 07/02/2010  . MRSA 05/13/2010  . PRURITUS 05/13/2010  . SPINAL STENOSIS, LUMBAR 05/13/2010  . HYPERLIPIDEMIA 04/24/2010  . GASTROESOPHAGEAL REFLUX DISEASE 04/24/2010  . VENTRAL HERNIA, INCISIONAL 04/24/2010  . DEGENERATIVE JOINT DISEASE 04/24/2010  . PYOGENIC ARTHRITIS, SHOULDER REGION 02/04/2010  . HYPERTENSION 01/01/2010  . RUPTURE ROTATOR CUFF 02/19/2009    End of Session Activity Tolerance: Patient tolerated treatment well General Behavior During Therapy: [facility] tasks assessed/performed   Limmie PatriciaLaura Essenmacher, OTR/L,CBIS   01/11/2014, 4:06 PM

## 2014-01-11 NOTE — Progress Notes (Addendum)
Physical Therapy Treatment Patient Details  Name: Jake MarvelRonald W Gervacio MRN: [id number] Date of Birth: 01/03/1941  Today's Date: 01/11/2014 Time: 8295-62131434-1520 PT Time Calculation (min): 46 min Visit#: 19 of 24  Re-eval: 01/18/14 Authorization: Medicare  Authorization Visit#: 19 of 20  Charges:  Gait 1434-1455 (16'), therex 1455-1520 (25')  Subjective:  PT states he is doing well today.  No complaints or pain.   Exercise/Treatments Supine Bridges: 15 reps;Limitations Straight Leg Raises: 15 reps;Both Prone  Hamstring Curl: 15 reps;Limitations Hamstring Curl Limitations: AAROM Hip Extension: 10 reps Hip Extension Limitations: AAROM with knee bent to 90 degrees      Physical Therapy Assessment and Plan PT Assessment and Plan Clinical Impression Statement: Began gait with Quad cane today requiring min-mod assist.  Less assistance needed today with prone exercises with noted hypertrophy of hamstrings bilaterally.  Pt encouraged to continue exercises at home.  PT Plan: continue to progress functional strength and mobility.  Gcode update due next visit.   Problem List Patient Active Problem List   Diagnosis Date Noted  . Muscle weakness (generalized) 12/05/2013  . Tight fascia 12/05/2013  . Decreased range of motion of right shoulder 12/05/2013  . Decreased range of motion of left shoulder 12/05/2013  . Bilateral leg weakness 11/21/2013  . Poor balance 11/21/2013  . Difficulty in walking(719.7) 11/21/2013  . Shoulder pain, left 09/12/2013  . Anemia, iron deficiency 09/09/2013  . Dyspnea 09/07/2013  . UTI (urinary tract infection) 09/07/2013  . Acute CHF (congestive heart failure) 09/07/2013  . Elevated troponin 09/07/2013  . Fever and chills 09/06/2013  . Altered mental status 09/05/2013  . Neurogenic bowel 08/22/2013  . Cauda equina syndrome with neurogenic bladder 08/22/2013  . Gout flare 08/22/2013  . Postoperative wound infection 08/22/2013  . Lumbar degenerative disc  disease 08/02/2013  . Post-operative pain 07/29/2013  . Osteonecrosis 05/05/2013  . Small bowel obstruction 04/17/2013  . Acute renal failure 04/17/2013  . Medial meniscus, posterior horn derangement 02/17/2013  . Knee sprain and strain 02/17/2013  . Trigger point with neck pain 03/18/2012  . Rotator cuff tear arthropathy of right shoulder 09/08/2011  . CAD in native artery 01/09/2011  . Ankle fracture 12/25/2010  . Contusion of shoulder, left 12/25/2010  . PEMPHIGUS VULGARIS 07/02/2010  . MRSA 05/13/2010  . PRURITUS 05/13/2010  . SPINAL STENOSIS, LUMBAR 05/13/2010  . HYPERLIPIDEMIA 04/24/2010  . GASTROESOPHAGEAL REFLUX DISEASE 04/24/2010  . VENTRAL HERNIA, INCISIONAL 04/24/2010  . DEGENERATIVE JOINT DISEASE 04/24/2010  . PYOGENIC ARTHRITIS, SHOULDER REGION 02/04/2010  . HYPERTENSION 01/01/2010  . RUPTURE ROTATOR CUFF 02/19/2009       GP    Lurena NidaAmy B Frazier, PTA/CLT 01/11/2014, 3:25 PM

## 2014-01-13 ENCOUNTER — Ambulatory Visit (HOSPITAL_COMMUNITY)
Admission: RE | Admit: 2014-01-13 | Discharge: 2014-01-13 | Disposition: A | Discharge status: Home / Self Care | Payer: Medicare Other | Source: Ambulatory Visit | Attending: Pulmonary Disease | Admitting: Pulmonary Disease

## 2014-01-13 DIAGNOSIS — R2689 Other abnormalities of gait and mobility: Secondary | ICD-10-CM

## 2014-01-13 DIAGNOSIS — R29898 Other symptoms and signs involving the musculoskeletal system: Secondary | ICD-10-CM

## 2014-01-13 DIAGNOSIS — IMO0001 Reserved for inherently not codable concepts without codable children: Secondary | ICD-10-CM | POA: Diagnosis not present

## 2014-01-13 DIAGNOSIS — R262 Difficulty in walking, not elsewhere classified: Secondary | ICD-10-CM

## 2014-01-13 NOTE — Progress Notes (Signed)
Occupational Therapy Treatment Patient Details  Name: [name] MRN: [id number] Date of Birth: 09-12-1940  Today's Date: 01/13/2014 Time: 4665-9935 OT Time Calculation (min): 38 min Manual 7017-7939 (10') Therapeutic Exercises 1537-1605 (28')  Visit#: 14 of 24  Re-eval: 01/30/14    Authorization: Medicare  Authorization Time Period: before 20th visit   Authorization Visit#: 14 of 20  Subjective Symptoms/Limitations Symptoms: "They're doing pretty good, that lefts ones a little sore." Pain Assessment Currently in Pain?: No/denies  Precautions/Restrictions  Precautions Precautions: Back  Exercise/Treatments Supine Protraction: Both;PROM;5 reps;AROM;15 reps Horizontal ABduction: Both;PROM;5 reps;AROM;10 reps External Rotation: Both;PROM;5 reps;AROM;10 reps Internal Rotation: Both;PROM;5 reps;AROM;10 reps Flexion: Both;PROM;5 reps;AROM;10 reps;Limitations Flexion Limitations: fingertip faciliation for left ABduction: Both;PROM;5 reps;AROM;10 reps;Limitations ABduction Limitations: fingertip faciliation on left Seated Protraction: AROM;10 reps Horizontal ABduction: AROM;10 reps Horizontal ABduction Limitations: unable to keep shoulder at 90 degrees External Rotation: AROM;10 reps Internal Rotation: AROM;10 reps Flexion: AROM;10 reps Flexion Limitations: facilitation to reach past 90 degrees on left  Abduction: AAROM;10 reps ABduction Limitations: faciliation for left ROM / Strengthening / Isometric Strengthening Proximal Shoulder Strengthening, Supine: 10x Proximal Shoulder Strengthening, Seated: 10x (completed at 45 degrees flexion with improved results)   Manual Therapy Manual Therapy: Myofascial release Myofascial Release: Myofascial release (MFR) to LEFT upper arm, anterior and posterior shoulder region to decrease fascial restrictions and promotote improved pain free range of motion.    Occupational Therapy Assessment and Plan OT Assessment and  Plan Clinical Impression Statement: Pt showing some improvements in shoulder strengthening. Good tolerance of supine AROM - some compensatory 'slinging' to iniatie movement on the right, and still requiring fingertip faciliation for flexion and abduction on the left.  Added proximal shoulder strengthening in sitting - pt had difficulty completing, but had more success with maintaining arms at 45 degrees rather than attempting to bring them to 90 degrees flexion. OT Plan: P: Complete MFR PRN. Resume prone exercises.   Goals Short Term Goals Short Term Goal 1: Pt will be educated on HEP Short Term Goal 1 Progress: Met Short Term Goal 2: Pt will achieve BUE shoulder AROM WFL in supine in working towards improving his golf swing. Short Term Goal 2 Progress: Met Short Term Goal 3: Pt will improve BUE strength to at least 4-/5 for improved ability to reach for with shaving supplies during morning ADLs. Short Term Goal 3 Progress: Progressing toward goal Short Term Goal 4: Pt will have decreased fascial restrictions to min-mod in BUE for decrased pain with motion. Short Term Goal 4 Progress: Met Short Term Goal 5: Pt will improve bilateral grip strength by at least 5# each. Short Term Goal 5 Progress: Met Long Term Goals Long Term Goal 1: Pt will achieve highest level of functioning with all ADLs, IADLs, and leisure tasks when using BUE. Long Term Goal 1 Progress: Progressing toward goal Long Term Goal 2: Pt will achieve BUE shoulder AROM WFL in standing in working towards improving his golf swing. Long Term Goal 2 Progress: Progressing toward goal Long Term Goal 3: Pt will improve BUE shoulder strength to at least 4/5 for improved ability to reach for items off of shelves. Long Term Goal 3 Progress: Progressing toward goal Long Term Goal 4: Pt will have decreased fascial restictions to trace-min in BUE. Long Term Goal 4 Progress: Progressing toward goal Long Term Goal 5: Pt will improve bilateral  grip strength by at least 10# each. Long Term Goal 5 Progress: Met Long Term Goal 6: Pt will verbalize less than 1/10  pain in his shoulder with movement. Long Term Goal 6 Progress: Met  Problem List Patient Active Problem List   Diagnosis Date Noted  . Muscle weakness (generalized) 12/05/2013  . Tight fascia 12/05/2013  . Decreased range of motion of right shoulder 12/05/2013  . Decreased range of motion of left shoulder 12/05/2013  . Bilateral leg weakness 11/21/2013  . Poor balance 11/21/2013  . Difficulty in walking(719.7) 11/21/2013  . Shoulder pain, left 09/12/2013  . Anemia, iron deficiency 09/09/2013  . Dyspnea 09/07/2013  . UTI (urinary tract infection) 09/07/2013  . Acute CHF (congestive heart failure) 09/07/2013  . Elevated troponin 09/07/2013  . Fever and chills 09/06/2013  . Altered mental status 09/05/2013  . Neurogenic bowel 08/22/2013  . Cauda equina syndrome with neurogenic bladder 08/22/2013  . Gout flare 08/22/2013  . Postoperative wound infection 08/22/2013  . Lumbar degenerative disc disease 08/02/2013  . Post-operative pain 07/29/2013  . Osteonecrosis 05/05/2013  . Small bowel obstruction 04/17/2013  . Acute renal failure 04/17/2013  . Medial meniscus, posterior horn derangement 02/17/2013  . Knee sprain and strain 02/17/2013  . Trigger point with neck pain 03/18/2012  . Rotator cuff tear arthropathy of right shoulder 09/08/2011  . CAD in native artery 01/09/2011  . Ankle fracture 12/25/2010  . Contusion of shoulder, left 12/25/2010  . PEMPHIGUS VULGARIS 07/02/2010  . MRSA 05/13/2010  . PRURITUS 05/13/2010  . SPINAL STENOSIS, LUMBAR 05/13/2010  . HYPERLIPIDEMIA 04/24/2010  . GASTROESOPHAGEAL REFLUX DISEASE 04/24/2010  . VENTRAL HERNIA, INCISIONAL 04/24/2010  . DEGENERATIVE JOINT DISEASE 04/24/2010  . PYOGENIC ARTHRITIS, SHOULDER REGION 02/04/2010  . HYPERTENSION 01/01/2010  . RUPTURE ROTATOR CUFF 02/19/2009    End of Session Activity  Tolerance: Patient tolerated treatment well General Behavior During Therapy: [facility] for tasks assessed/performed  GO    Bea Graff, [place], OTR/L [phone]  01/13/2014, 4:14 PM

## 2014-01-13 NOTE — Progress Notes (Signed)
Physical Therapy Treatment Patient Details  Name: [name] MRN: [id number] Date of Birth: 03/20/1941  Today's Date: 01/13/2014 Time: 4098-11911432-1520 PT Time Calculation (min): 48 min Charge :TE 1432-1510, Gait 1510-1520  Visit#: 20 of 24  Re-eval: 01/18/14 Assessment Diagnosis: HNP Next MD Visit: Dr Juanetta GoslingHawkins 3-4 weeks  Authorization: Medicare  Authorization Time Period:    Authorization Visit#: 20 of 30   Subjective: Symptoms/Limitations Symptoms: Pain free, pt feels his strength and balance are improving slowly. Pain Assessment Currently in Pain?: No/denies  Precautions/Restrictions  Precautions Precautions: Back  Exercise/Treatments Aerobic Stationary Bike: NuStep x10' L5 hills #4 to improve strength and activity tolerance; SPM 140  Standing Functional Squat: 20 reps Gait Training: gait with WBQC 40' X 3 bouts Other Standing Knee Exercises: Weight shifting, static standing dorsiflexion/plantarflexion 10x with  Other Standing Knee Exercises: Standing no HHA 55" max of 3 attempts Seated Other Seated Knee Exercises: 5 STS with min assistance Supine Bridges: 20 reps Straight Leg Raises: 15 reps;Both;Limitations Straight Leg Raises Limitations: 2# each LE Other Supine Knee Exercises: Clam 15x Prone  Hamstring Curl: 15 reps;Limitations Hamstring Curl Limitations: AAROM    Physical Therapy Assessment and Plan PT Assessment and Plan Clinical Impression Statement: Pt progressing well towards PT POC, improved static standing with no HHA for 55".  Contniued gait training with quad cane with min cueing for sequencing and min-mod assistance.  Overall musclature continues to be weak, required AA with prone exercises with visiable hyper trophy of hamstrings and gastrocnemius.  Improving hip flexion strength, able to add 2# with SLR today.  Improved percieived functional ability wtih FOTO score of 40% (was 35% initially).   PT Plan: continue to progress functional strength and  mobility to improve gait.     Goals PT Short Term Goals PT Short Term Goal 1: Pt to increase Berg balace by 5 to decrease risk of falling PT Short Term Goal 2: Pt to be walking for 10 mintues with rolling walker PT Short Term Goal 2 - Progress: Progressing toward goal PT Short Term Goal 3: Pt to be able to stand for a minute with no assistive device to allow easier grooming  PT Short Term Goal 3 - Progress: Progressing toward goal PT Long Term Goals PT Long Term Goal 1: Berg balance to be increased by 10 to allow decreased risk of falling PT Long Term Goal 1 - Progress: Progressing toward goal PT Long Term Goal 2: Pt to be ambulating with walker x 30 minutes PT Long Term Goal 2 - Progress: Progressing toward goal Long Term Goal 3: Pt to be able to stand without assistive device for five minutes to ease grooming Long Term Goal 3 Progress: Progressing toward goal Long Term Goal 4: Pt to be able to stand with walker for 15 minutes for socializing . PT Long Term Goal 5: strength of LE improved one grade to allow the above to occur Long Term Goal 5 Progress: Progressing toward goal  Problem List Patient Active Problem List   Diagnosis Date Noted  . Muscle weakness (generalized) 12/05/2013  . Tight fascia 12/05/2013  . Decreased range of motion of right shoulder 12/05/2013  . Decreased range of motion of left shoulder 12/05/2013  . Bilateral leg weakness 11/21/2013  . Poor balance 11/21/2013  . Difficulty in walking(719.7) 11/21/2013  . Shoulder pain, left 09/12/2013  . Anemia, iron deficiency 09/09/2013  . Dyspnea 09/07/2013  . UTI (urinary tract infection) 09/07/2013  . Acute CHF (congestive heart failure) 09/07/2013  .  Elevated troponin 09/07/2013  . Fever and chills 09/06/2013  . Altered mental status 09/05/2013  . Neurogenic bowel 08/22/2013  . Cauda equina syndrome with neurogenic bladder 08/22/2013  . Gout flare 08/22/2013  . Postoperative wound infection 08/22/2013  .  Lumbar degenerative disc disease 08/02/2013  . Post-operative pain 07/29/2013  . Osteonecrosis 05/05/2013  . Small bowel obstruction 04/17/2013  . Acute renal failure 04/17/2013  . Medial meniscus, posterior horn derangement 02/17/2013  . Knee sprain and strain 02/17/2013  . Trigger point with neck pain 03/18/2012  . Rotator cuff tear arthropathy of right shoulder 09/08/2011  . CAD in native artery 01/09/2011  . Ankle fracture 12/25/2010  . Contusion of shoulder, left 12/25/2010  . PEMPHIGUS VULGARIS 07/02/2010  . MRSA 05/13/2010  . PRURITUS 05/13/2010  . SPINAL STENOSIS, LUMBAR 05/13/2010  . HYPERLIPIDEMIA 04/24/2010  . GASTROESOPHAGEAL REFLUX DISEASE 04/24/2010  . VENTRAL HERNIA, INCISIONAL 04/24/2010  . DEGENERATIVE JOINT DISEASE 04/24/2010  . PYOGENIC ARTHRITIS, SHOULDER REGION 02/04/2010  . HYPERTENSION 01/01/2010  . RUPTURE ROTATOR CUFF 02/19/2009    PT - End of Session Equipment Utilized During Treatment: Gait belt Activity Tolerance: Patient limited by fatigue;Patient tolerated treatment well General Behavior During Therapy: [facility] for tasks assessed/performed  GP Functional Assessment Tool Used: foto  Functional Limitation: Mobility: Walking and moving around Mobility: Walking and Moving Around Current Status ([id number](G8978): At least 60 percent but less than 80 percent impaired, limited or restricted Mobility: Walking and Moving Around Goal Status [phone](G8979): At least 40 percent but less than 60 percent impaired, limited or restricted  Juel BurrowCockerham, Casey Jo 01/13/2014, 3:17 PM  Cash DeWitt PT DPT 01/13/14 4:39PM

## 2014-01-13 NOTE — Progress Notes (Addendum)
Physical Therapy Treatment Patient Details  Name: [name] MRN: [name] Date of Birth: 03/18/1941  Today's Date: 01/13/2014 Time: 6213-08651432-1520 PT Time Calculation (min): 48 min Charge :TE 1432-1510, Gait 1510-1520  Visit#: 20 of 24  Re-eval: 01/18/14 Assessment Diagnosis: HNP Next MD Visit: Dr Juanetta GoslingHawkins 3-4 weeks  Authorization: Medicare  Authorization Time Period:    Authorization Visit#: 20 of 30   Subjective: Symptoms/Limitations Symptoms: Pain free, pt feels his strength and balance are improving slowly. Pain Assessment Currently in Pain?: No/denies  Precautions/Restrictions  Precautions Precautions: Back  Exercise/Treatments Aerobic Stationary Bike: NuStep x10' L5 hills #4 to improve strength and activity tolerance; SPM 140  Standing Functional Squat: 20 reps Gait Training: gait with WBQC 40' X 3 bouts Other Standing Knee Exercises: Weight shifting, static standing dorsiflexion/plantarflexion 10x with  Other Standing Knee Exercises: Standing no HHA 55" max of 3 attempts Seated Other Seated Knee Exercises: 5 STS with min assistance Supine Bridges: 20 reps Straight Leg Raises: 15 reps;Both;Limitations Straight Leg Raises Limitations: 2# each LE Other Supine Knee Exercises: Clam 15x Prone  Hamstring Curl: 15 reps;Limitations Hamstring Curl Limitations: AAROM      Physical Therapy Assessment and Plan PT Assessment and Plan Clinical Impression Statement: Pt progressing well towards PT POC, improved static standing with no HHA for 55".  Contniued gait training with quad cane with min cueing for sequencing and min-mod assistance.  Overall musclature continues to be weak, required AA with prone exercises with visiable hyper trophy of hamstrings and gastrocnemius.  Improving hip flexion strength, able to add 2# with SLR today.  Improved percieived functional ability wtih FOTO score of 40% (was 35% initially).   PT Plan: continue to progress functional strength  and mobility.    Goals PT Short Term Goals PT Short Term Goal 1: Pt to increase Berg balace by 5 to decrease risk of falling PT Short Term Goal 2: Pt to be walking for 10 mintues with rolling walker PT Short Term Goal 2 - Progress: Progressing toward goal PT Short Term Goal 3: Pt to be able to stand for a minute with no assistive device to allow easier grooming  PT Short Term Goal 3 - Progress: Progressing toward goal PT Long Term Goals PT Long Term Goal 1: Berg balance to be increased by 10 to allow decreased risk of falling PT Long Term Goal 1 - Progress: Progressing toward goal PT Long Term Goal 2: Pt to be ambulating with walker x 30 minutes PT Long Term Goal 2 - Progress: Progressing toward goal Long Term Goal 3: Pt to be able to stand without assistive device for five minutes to ease grooming Long Term Goal 3 Progress: Progressing toward goal Long Term Goal 4: Pt to be able to stand with walker for 15 minutes for socializing . PT Long Term Goal 5: strength of LE improved one grade to allow the above to occur Long Term Goal 5 Progress: Progressing toward goal  Problem List Patient Active Problem List   Diagnosis Date Noted  . Muscle weakness (generalized) 12/05/2013  . Tight fascia 12/05/2013  . Decreased range of motion of right shoulder 12/05/2013  . Decreased range of motion of left shoulder 12/05/2013  . Bilateral leg weakness 11/21/2013  . Poor balance 11/21/2013  . Difficulty in walking(719.7) 11/21/2013  . Shoulder pain, left 09/12/2013  . Anemia, iron deficiency 09/09/2013  . Dyspnea 09/07/2013  . UTI (urinary tract infection) 09/07/2013  . Acute CHF (congestive heart failure) 09/07/2013  . Elevated troponin  09/07/2013  . Fever and chills 09/06/2013  . Altered mental status 09/05/2013  . Neurogenic bowel 08/22/2013  . Cauda equina syndrome with neurogenic bladder 08/22/2013  . Gout flare 08/22/2013  . Postoperative wound infection 08/22/2013  . Lumbar  degenerative disc disease 08/02/2013  . Post-operative pain 07/29/2013  . Osteonecrosis 05/05/2013  . Small bowel obstruction 04/17/2013  . Acute renal failure 04/17/2013  . Medial meniscus, posterior horn derangement 02/17/2013  . Knee sprain and strain 02/17/2013  . Trigger point with neck pain 03/18/2012  . Rotator cuff tear arthropathy of right shoulder 09/08/2011  . CAD in native artery 01/09/2011  . Ankle fracture 12/25/2010  . Contusion of shoulder, left 12/25/2010  . PEMPHIGUS VULGARIS 07/02/2010  . MRSA 05/13/2010  . PRURITUS 05/13/2010  . SPINAL STENOSIS, LUMBAR 05/13/2010  . HYPERLIPIDEMIA 04/24/2010  . GASTROESOPHAGEAL REFLUX DISEASE 04/24/2010  . VENTRAL HERNIA, INCISIONAL 04/24/2010  . DEGENERATIVE JOINT DISEASE 04/24/2010  . PYOGENIC ARTHRITIS, SHOULDER REGION 02/04/2010  . HYPERTENSION 01/01/2010  . RUPTURE ROTATOR CUFF 02/19/2009    PT - End of Session Equipment Utilized During Treatment: Gait belt Activity Tolerance: Patient limited by fatigue;Patient tolerated treatment well General Behavior During Therapy: [facility] for tasks assessed/performed  GP Functional Assessment Tool Used: foto  Functional Limitation: Mobility: Walking and moving around Mobility: Walking and Moving Around Current Status (F6213(G8978): At least 60 percent but less than 80 percent impaired, limited or restricted Mobility: Walking and Moving Around Goal Status [phone](G8979): At least 40 percent but less than 60 percent impaired, limited or restricted  [name] 01/13/2014, 3:17 PM

## 2014-01-16 ENCOUNTER — Ambulatory Visit (HOSPITAL_COMMUNITY)
Admission: RE | Admit: 2014-01-16 | Discharge: 2014-01-16 | Disposition: A | Discharge status: Home / Self Care | Payer: Medicare Other | Source: Ambulatory Visit | Attending: Pulmonary Disease | Admitting: Pulmonary Disease

## 2014-01-16 ENCOUNTER — Ambulatory Visit (HOSPITAL_COMMUNITY): Payer: Medicare Other

## 2014-01-16 DIAGNOSIS — IMO0001 Reserved for inherently not codable concepts without codable children: Secondary | ICD-10-CM | POA: Diagnosis not present

## 2014-01-16 DIAGNOSIS — M25612 Stiffness of left shoulder, not elsewhere classified: Secondary | ICD-10-CM

## 2014-01-16 DIAGNOSIS — M6289 Other specified disorders of muscle: Secondary | ICD-10-CM

## 2014-01-16 DIAGNOSIS — M25611 Stiffness of right shoulder, not elsewhere classified: Secondary | ICD-10-CM

## 2014-01-16 DIAGNOSIS — M629 Disorder of muscle, unspecified: Secondary | ICD-10-CM

## 2014-01-16 DIAGNOSIS — M6281 Muscle weakness (generalized): Secondary | ICD-10-CM

## 2014-01-16 NOTE — Progress Notes (Signed)
Occupational Therapy Treatment Patient Details  Name: [name] MRN: [id number] Date of Birth: 09/27/1940  Today's Date: 01/16/2014 Time: 6962-95281308-1348 OT Time Calculation (min): 40 min MFR 4132-44011308-1320 12' Therex 0272-53661320-1348 28'  Visit#: 15 of 24  Re-eval: 01/30/14    Authorization: Medicare  Authorization Time Period: before 20th visit   Authorization Visit#: 15 of 20  Subjective Symptoms/Limitations Symptoms: S: My arms are a little sore today.  Pain Assessment Currently in Pain?: Yes Pain Score: 3  Pain Location: Shoulder Pain Orientation: Right;Left Pain Type: Chronic pain  Precautions/Restrictions  Precautions Precautions: Back  Exercise/Treatments Supine Protraction: Both;AROM;15 reps Horizontal ABduction: Both;AROM;15 reps External Rotation: Both;AROM;15 reps Internal Rotation: Both;AROM;15 reps Flexion: Both;AROM;15 reps ABduction: Both;AROM;Right;15 reps;Left;10 reps;Limitations ABduction Limitations: fingertip faciliation on left Seated Protraction: AROM;10 reps Horizontal ABduction: AROM;10 reps Horizontal ABduction Limitations: unable to keep shoulder at 90 degrees External Rotation: AROM;10 reps Internal Rotation: AROM;10 reps Flexion: AROM;10 reps Flexion Limitations: facilitation to reach past 90 degrees on left  Abduction: AAROM;10 reps ABduction Limitations: faciliation for left Prone  Other Prone Exercises: [place] exercises completed each arm completed individually; 10X. No ER/IR completed this date. ROM / Strengthening / Isometric Strengthening UBE (Upper Arm Bike): Level 3 3' forward 3' reverse Wall Wash: 1' each arm Proximal Shoulder Strengthening, Supine: 15X with rest breaks Proximal Shoulder Strengthening, Seated: 10x (able to achieve 45 degrees flexion for movement)      Manual Therapy Manual Therapy: Myofascial release Myofascial Release: Myofascial release (MFR) to BILATERAL upper arm, anterior and posterior shoulder region to  decrease fascial restrictions and promotote improved pain free range of motion.  Occupational Therapy Assessment and Plan OT Assessment and Plan Clinical Impression Statement: A: Patient continuing to show progress towards goals. LUE struggles with many of the shoulder stability and strengthening exercises. Patient psuhes self each session to do more than requested. Patient requested MFR on both arm this date.  OT Plan: P: Complete MFR PRN. Resume theraband exercises.    Goals Short Term Goals Short Term Goal 1: Pt will be educated on HEP Short Term Goal 2: Pt will achieve BUE shoulder AROM WFL in supine in working towards improving his golf swing. Short Term Goal 3: Pt will improve BUE strength to at least 4-/5 for improved ability to reach for with shaving supplies during morning ADLs. Short Term Goal 4: Pt will have decreased fascial restrictions to min-mod in BUE for decrased pain with motion. Short Term Goal 5: Pt will improve bilateral grip strength by at least 5# each. Long Term Goals Long Term Goal 1: Pt will achieve highest level of functioning with all ADLs, IADLs, and leisure tasks when using BUE. Long Term Goal 1 Progress: Progressing toward goal Long Term Goal 2: Pt will achieve BUE shoulder AROM WFL in standing in working towards improving his golf swing. Long Term Goal 2 Progress: Progressing toward goal Long Term Goal 3: Pt will improve BUE shoulder strength to at least 4/5 for improved ability to reach for items off of shelves. Long Term Goal 3 Progress: Progressing toward goal Long Term Goal 4: Pt will have decreased fascial restictions to trace-min in BUE. Long Term Goal 4 Progress: Progressing toward goal Long Term Goal 5: Pt will improve bilateral grip strength by at least 10# each. Long Term Goal 6: Pt will verbalize less than 1/10 pain in his shoulder with movement.  Problem List Patient Active Problem List   Diagnosis Date Noted  . Muscle weakness (generalized)  12/05/2013  . Tight  fascia 12/05/2013  . Decreased range of motion of right shoulder 12/05/2013  . Decreased range of motion of left shoulder 12/05/2013  . Bilateral leg weakness 11/21/2013  . Poor balance 11/21/2013  . Difficulty in walking(719.7) 11/21/2013  . Shoulder pain, left 09/12/2013  . Anemia, iron deficiency 09/09/2013  . Dyspnea 09/07/2013  . UTI (urinary tract infection) 09/07/2013  . Acute CHF (congestive heart failure) 09/07/2013  . Elevated troponin 09/07/2013  . Fever and chills 09/06/2013  . Altered mental status 09/05/2013  . Neurogenic bowel 08/22/2013  . Cauda equina syndrome with neurogenic bladder 08/22/2013  . Gout flare 08/22/2013  . Postoperative wound infection 08/22/2013  . Lumbar degenerative disc disease 08/02/2013  . Post-operative pain 07/29/2013  . Osteonecrosis 05/05/2013  . Small bowel obstruction 04/17/2013  . Acute renal failure 04/17/2013  . Medial meniscus, posterior horn derangement 02/17/2013  . Knee sprain and strain 02/17/2013  . Trigger point with neck pain 03/18/2012  . Rotator cuff tear arthropathy of right shoulder 09/08/2011  . CAD in native artery 01/09/2011  . Ankle fracture 12/25/2010  . Contusion of shoulder, left 12/25/2010  . PEMPHIGUS VULGARIS 07/02/2010  . MRSA 05/13/2010  . PRURITUS 05/13/2010  . SPINAL STENOSIS, LUMBAR 05/13/2010  . HYPERLIPIDEMIA 04/24/2010  . GASTROESOPHAGEAL REFLUX DISEASE 04/24/2010  . VENTRAL HERNIA, INCISIONAL 04/24/2010  . DEGENERATIVE JOINT DISEASE 04/24/2010  . PYOGENIC ARTHRITIS, SHOULDER REGION 02/04/2010  . HYPERTENSION 01/01/2010  . RUPTURE ROTATOR CUFF 02/19/2009    End of Session Activity Tolerance: Patient tolerated treatment well General Behavior During Therapy: [facility] for tasks assessed/performed   Limmie PatriciaLaura Essenmacher, OTR/L,CBIS   01/16/2014, 1:56 PM

## 2014-01-18 ENCOUNTER — Ambulatory Visit (HOSPITAL_COMMUNITY): Payer: Medicare Other | Admitting: Specialist

## 2014-01-18 ENCOUNTER — Ambulatory Visit (HOSPITAL_COMMUNITY)
Admission: RE | Admit: 2014-01-18 | Discharge: 2014-01-18 | Disposition: A | Discharge status: Home / Self Care | Payer: Medicare Other | Source: Ambulatory Visit | Attending: Pulmonary Disease | Admitting: Pulmonary Disease

## 2014-01-18 DIAGNOSIS — M6281 Muscle weakness (generalized): Secondary | ICD-10-CM

## 2014-01-18 DIAGNOSIS — M25611 Stiffness of right shoulder, not elsewhere classified: Secondary | ICD-10-CM

## 2014-01-18 DIAGNOSIS — M6289 Other specified disorders of muscle: Secondary | ICD-10-CM

## 2014-01-18 DIAGNOSIS — IMO0001 Reserved for inherently not codable concepts without codable children: Secondary | ICD-10-CM | POA: Diagnosis not present

## 2014-01-18 DIAGNOSIS — M629 Disorder of muscle, unspecified: Secondary | ICD-10-CM

## 2014-01-18 DIAGNOSIS — M25612 Stiffness of left shoulder, not elsewhere classified: Secondary | ICD-10-CM

## 2014-01-18 NOTE — Progress Notes (Signed)
Occupational Therapy Treatment Patient Details  Name: [name] MRN: [id number] Date of Birth: 10/27/1940  Today's Date: 01/18/2014 Time: 2952-84131357-1435 OT Time Calculation (min): 38 min Manual therapy 2440-10271357-1411 14' Therapeutic exercises 1411-1435 24'  Visit#: 16 of 24  Re-eval: 01/30/14    Authorization: Medicare  Authorization Time Period: before 20th visit   Authorization Visit#: 16 of 20  Subjective  S:  I think my arm is a bit tight today. Pain Assessment Currently in Pain?: Yes Pain Score: 2  Pain Location: Shoulder Pain Orientation: Right;Left Pain Type: Chronic pain  Precautions/Restrictions   progress as tolerated  Exercise/Treatments Supine Protraction: Both;AROM;15 reps Horizontal ABduction: Both;AROM;15 reps External Rotation: Both;AROM;15 reps Internal Rotation: Both;AROM;15 reps Flexion: Both;AROM;15 reps ABduction: Both;AROM;Right;15 reps;Left;10 reps;Limitations ROM / Strengthening / Isometric Strengthening UBE (Upper Arm Bike): level 4 3' forward and 3' in reverse Cybex Press: 2 plate;15 reps Proximal Shoulder Strengthening, Seated: 10x      Manual Therapy Myofascial Release: Myofascial release (MFR) to left upper arm, anterior and posterior shoulder region to decrease fascial restrictions and promotote improved pain free range of motion. completed this date in fulture PRN  Occupational Therapy Assessment and Plan OT Assessment and Plan Clinical Impression Statement: A:  Added cybex press for increased shoulder strengthening.  Requested MFR on left shoulder only this date.  increased to level 4 for UBE. OT Plan: P:  Attempt row on cybex.   Goals Short Term Goals Short Term Goal 1: Pt will be educated on HEP Short Term Goal 2: Pt will achieve BUE shoulder AROM WFL in supine in working towards improving his golf swing. Short Term Goal 3: Pt will improve BUE strength to at least 4-/5 for improved ability to reach for with shaving supplies during  morning ADLs. Short Term Goal 4: Pt will have decreased fascial restrictions to min-mod in BUE for decrased pain with motion. Short Term Goal 5: Pt will improve bilateral grip strength by at least 5# each. Long Term Goals Long Term Goal 1: Pt will achieve highest level of functioning with all ADLs, IADLs, and leisure tasks when using BUE. Long Term Goal 2: Pt will achieve BUE shoulder AROM WFL in standing in working towards improving his golf swing. Long Term Goal 3: Pt will improve BUE shoulder strength to at least 4/5 for improved ability to reach for items off of shelves. Long Term Goal 4: Pt will have decreased fascial restictions to trace-min in BUE. Long Term Goal 5: Pt will improve bilateral grip strength by at least 10# each. Long Term Goal 6: Pt will verbalize less than 1/10 pain in his shoulder with movement.  Problem List Patient Active Problem List   Diagnosis Date Noted  . Muscle weakness (generalized) 12/05/2013  . Tight fascia 12/05/2013  . Decreased range of motion of right shoulder 12/05/2013  . Decreased range of motion of left shoulder 12/05/2013  . Bilateral leg weakness 11/21/2013  . Poor balance 11/21/2013  . Difficulty in walking(719.7) 11/21/2013  . Shoulder pain, left 09/12/2013  . Anemia, iron deficiency 09/09/2013  . Dyspnea 09/07/2013  . UTI (urinary tract infection) 09/07/2013  . Acute CHF (congestive heart failure) 09/07/2013  . Elevated troponin 09/07/2013  . Fever and chills 09/06/2013  . Altered mental status 09/05/2013  . Neurogenic bowel 08/22/2013  . Cauda equina syndrome with neurogenic bladder 08/22/2013  . Gout flare 08/22/2013  . Postoperative wound infection 08/22/2013  . Lumbar degenerative disc disease 08/02/2013  . Post-operative pain 07/29/2013  . Osteonecrosis 05/05/2013  .  Small bowel obstruction 04/17/2013  . Acute renal failure 04/17/2013  . Medial meniscus, posterior horn derangement 02/17/2013  . Knee sprain and strain  02/17/2013  . Trigger point with neck pain 03/18/2012  . Rotator cuff tear arthropathy of right shoulder 09/08/2011  . CAD in native artery 01/09/2011  . Ankle fracture 12/25/2010  . Contusion of shoulder, left 12/25/2010  . PEMPHIGUS VULGARIS 07/02/2010  . MRSA 05/13/2010  . PRURITUS 05/13/2010  . SPINAL STENOSIS, LUMBAR 05/13/2010  . HYPERLIPIDEMIA 04/24/2010  . GASTROESOPHAGEAL REFLUX DISEASE 04/24/2010  . VENTRAL HERNIA, INCISIONAL 04/24/2010  . DEGENERATIVE JOINT DISEASE 04/24/2010  . PYOGENIC ARTHRITIS, SHOULDER REGION 02/04/2010  . HYPERTENSION 01/01/2010  . RUPTURE ROTATOR CUFF 02/19/2009    End of Session Activity Tolerance: Patient tolerated treatment well General Behavior During Therapy: [facility]WFL for tasks assessed/performed  GO    Shirlean MylarBethany H. Murray, OTR/L [phone]  01/18/2014, 3:37 PM

## 2014-01-18 NOTE — Progress Notes (Signed)
Physical Therapy Treatment Patient Details  Name: [name] MRN: [id number] Date of Birth: 11/12/1940  Today's Date: 01/18/2014 Time: 1305-1355 PT Time Calculation (min): 50 min TE  Visit#: 21 of 24  Re-eval: 01/23/14    Authorization: Medicare  Authorization Time Period:    Authorization Visit#: 21 of 30   Subjective: Symptoms/Limitations Symptoms: Patient has no complaints today Pain Assessment Currently in Pain?: Yes Pain Score: 2  Pain Location: Shoulder Pain Orientation: Right;Left Pain Type: Chronic pain      Exercise/Treatments Mobility/Balance     Berg Balance Test Sit to Stand: Able to stand  independently using hands Standing Unsupported: Able to stand 30 seconds unsupported Sitting with Back Unsupported but Feet Supported on Floor or Stool: Able to sit safely and securely 2 minutes Stand to Sit: Controls descent by using hands Transfers: Able to transfer safely, definite need of hands Standing Unsupported with Eyes Closed: Unable to keep eyes closed 3 seconds but stays steady Standing Ubsupported with Feet Together: Able to place feet together independently and stand for 1 minute with supervision From Standing, Reach Forward with Outstretched Arm: Reaches forward but needs supervision From Standing Position, Pick up Object from Floor: Able to pick up shoe, needs supervision From Standing Position, Turn to Look Behind Over each Shoulder: Needs assist to keep from losing balance and falling Turn 360 Degrees: Needs assistance while turning Standing Unsupported, Alternately Place Feet on Step/Stool: Needs assistance to keep from falling or unable to try Standing Unsupported, One Foot in Front: Loses balance while stepping or standing Standing on One Leg: Unable to try or needs assist to prevent fall Total Score: 23  Aerobic Stationary Bike: NuStep x10' L5 hills #4 to improve strength and activity tolerance; SPM 140    Standing Other Standing Knee  Exercises: Weight shifting, static standing dorsiflexion/plantarflexion 10x with    Supine Straight Leg Raises: 15 reps;Both;Limitations Straight Leg Raises Limitations: 2# each LE   Prone  Hamstring Curl: 15 reps;Limitations Hamstring Curl Limitations: AAROM   Manual Therapy Myofascial Release: Myofascial release (MFR) to left upper arm, anterior and posterior shoulder region to decrease fascial restrictions and promotote improved pain free range of motion. completed this date in fulture PRN Afore mentioned Manual Therapy completed by OT.  Physical Therapy Assessment and Plan PT Assessment and Plan Clinical Impression Statement: Patient increased score on Berg today from 16 to 23 (last tested 5/27). Patient is unable to move right ankle into planterflexion (left not tested today in AFO) atrophied gastroc with no palpable contraction. Neuromuscular  re-ed with estim would be appropriated if not contraindicated (no history of cancer nor pacemaker per patient) to generated contraction/strength in an outpatient setting for education and supervision of proper technique and use of electrical stimulation and neuromuscular re-education.   PT Plan: continue to progress functional strength and mobility.Continue with PT POC    Goals    Problem List Patient Active Problem List   Diagnosis Date Noted  . Muscle weakness (generalized) 12/05/2013  . Tight fascia 12/05/2013  . Decreased range of motion of right shoulder 12/05/2013  . Decreased range of motion of left shoulder 12/05/2013  . Bilateral leg weakness 11/21/2013  . Poor balance 11/21/2013  . Difficulty in walking(719.7) 11/21/2013  . Shoulder pain, left 09/12/2013  . Anemia, iron deficiency 09/09/2013  . Dyspnea 09/07/2013  . UTI (urinary tract infection) 09/07/2013  . Acute CHF (congestive heart failure) 09/07/2013  . Elevated troponin 09/07/2013  . Fever and chills 09/06/2013  . Altered  mental status 09/05/2013  . Neurogenic  bowel 08/22/2013  . Cauda equina syndrome with neurogenic bladder 08/22/2013  . Gout flare 08/22/2013  . Postoperative wound infection 08/22/2013  . Lumbar degenerative disc disease 08/02/2013  . Post-operative pain 07/29/2013  . Osteonecrosis 05/05/2013  . Small bowel obstruction 04/17/2013  . Acute renal failure 04/17/2013  . Medial meniscus, posterior horn derangement 02/17/2013  . Knee sprain and strain 02/17/2013  . Trigger point with neck pain 03/18/2012  . Rotator cuff tear arthropathy of right shoulder 09/08/2011  . CAD in native artery 01/09/2011  . Ankle fracture 12/25/2010  . Contusion of shoulder, left 12/25/2010  . PEMPHIGUS VULGARIS 07/02/2010  . MRSA 05/13/2010  . PRURITUS 05/13/2010  . SPINAL STENOSIS, LUMBAR 05/13/2010  . HYPERLIPIDEMIA 04/24/2010  . GASTROESOPHAGEAL REFLUX DISEASE 04/24/2010  . VENTRAL HERNIA, INCISIONAL 04/24/2010  . DEGENERATIVE JOINT DISEASE 04/24/2010  . PYOGENIC ARTHRITIS, SHOULDER REGION 02/04/2010  . HYPERTENSION 01/01/2010  . RUPTURE ROTATOR CUFF 02/19/2009    PT - End of Session Equipment Utilized During Treatment: Gait belt Activity Tolerance: Patient tolerated treatment well;Patient limited by fatigue  GP    EAGLETON, REBECCA ATKINSO 01/18/2014, 2:30 PM

## 2014-01-20 ENCOUNTER — Ambulatory Visit (HOSPITAL_COMMUNITY): Payer: Medicare Other

## 2014-01-20 ENCOUNTER — Ambulatory Visit (HOSPITAL_COMMUNITY)
Admission: RE | Admit: 2014-01-20 | Discharge: 2014-01-20 | Disposition: A | Discharge status: Home / Self Care | Payer: Medicare Other | Source: Ambulatory Visit | Attending: Pulmonary Disease | Admitting: Pulmonary Disease

## 2014-01-20 DIAGNOSIS — IMO0001 Reserved for inherently not codable concepts without codable children: Secondary | ICD-10-CM | POA: Diagnosis not present

## 2014-01-20 DIAGNOSIS — M6281 Muscle weakness (generalized): Secondary | ICD-10-CM

## 2014-01-20 DIAGNOSIS — M25612 Stiffness of left shoulder, not elsewhere classified: Secondary | ICD-10-CM

## 2014-01-20 DIAGNOSIS — M25611 Stiffness of right shoulder, not elsewhere classified: Secondary | ICD-10-CM

## 2014-01-20 DIAGNOSIS — M629 Disorder of muscle, unspecified: Secondary | ICD-10-CM

## 2014-01-20 DIAGNOSIS — M6289 Other specified disorders of muscle: Secondary | ICD-10-CM

## 2014-01-20 NOTE — Progress Notes (Signed)
Occupational Therapy Treatment Patient Details  Name: Jake MarvelRonald W Barrows MRN: [id number] Date of Birth: 03/06/1941  Today's Date: 01/20/2014 Time: 7829-56211305-1350 OT Time Calculation (min): 45 min Manual therapy [id number] 30' Therapeutic exercises 1335-1350 15' Visit#: 17 of 24  Re-eval: 01/30/14    Authorization: Medicare  Authorization Time Period: before 20th visit   Authorization Visit#: 17 of 20  Subjective S:  I am getting a bit stronger. Pain Assessment Currently in Pain?: Yes Pain Score: 2  Pain Location: Shoulder Pain Orientation: Left  Precautions/Restrictions   progress as tolerated  Exercise/Treatments Supine Protraction: PROM;10 reps;Strengthening;15 reps Protraction Weight (lbs): 1 Horizontal ABduction: PROM;10 reps;Strengthening;15 reps Horizontal ABduction Weight (lbs): 1 External Rotation: PROM;10 reps;Strengthening;15 reps External Rotation Weight (lbs): 1 Internal Rotation: PROM;10 reps;Strengthening;15 reps Internal Rotation Weight (lbs): 1 Flexion: PROM;5 reps;Strengthening;15 reps Shoulder Flexion Weight (lbs): 1 ABduction: PROM;10 reps;Strengthening;15 reps Shoulder ABduction Weight (lbs): 1 Prone  Other Prone Exercises: [place] exercises completed each arm completed individually; 10X. No ER/IR completed this date. ROM / Strengthening / Isometric Strengthening UBE (Upper Arm Bike): level 4 3' forward and 3' in reverse Cybex Press: 2.5 plate;15 reps Wall Wash: 2' right arm, 1' left arm Proximal Shoulder Strengthening, Supine: 10 times with 1# without resting      Manual Therapy Myofascial Release: Myofascial release (MFR) to left upper arm, anterior and posterior shoulder region to decrease fascial restrictions and promotote improved pain free range of motion. completed this date in fulture PRN  Occupational Therapy Assessment and Plan OT Assessment and Plan Clinical Impression Statement: A:  requested MFR in left shoulder this date.  Time  limitations did not allow us to complete cybex row OT Plan: P:  Attempt row on cybex.   Goals    Problem List Patient Active Problem List   Diagnosis Date Noted  . Muscle weakness (generalized) 12/05/2013  . Tight fascia 12/05/2013  . Decreased range of motion of right shoulder 12/05/2013  . Decreased range of motion of left shoulder 12/05/2013  . Bilateral leg weakness 11/21/2013  . Poor balance 11/21/2013  . Difficulty in walking(719.7) 11/21/2013  . Shoulder pain, left 09/12/2013  . Anemia, iron deficiency 09/09/2013  . Dyspnea 09/07/2013  . UTI (urinary tract infection) 09/07/2013  . Acute CHF (congestive heart failure) 09/07/2013  . Elevated troponin 09/07/2013  . Fever and chills 09/06/2013  . Altered mental status 09/05/2013  . Neurogenic bowel 08/22/2013  . Cauda equina syndrome with neurogenic bladder 08/22/2013  . Gout flare 08/22/2013  . Postoperative wound infection 08/22/2013  . Lumbar degenerative disc disease 08/02/2013  . Post-operative pain 07/29/2013  . Osteonecrosis 05/05/2013  . Small bowel obstruction 04/17/2013  . Acute renal failure 04/17/2013  . Medial meniscus, posterior horn derangement 02/17/2013  . Knee sprain and strain 02/17/2013  . Trigger point with neck pain 03/18/2012  . Rotator cuff tear arthropathy of right shoulder 09/08/2011  . CAD in native artery 01/09/2011  . Ankle fracture 12/25/2010  . Contusion of shoulder, left 12/25/2010  . PEMPHIGUS VULGARIS 07/02/2010  . MRSA 05/13/2010  . PRURITUS 05/13/2010  . SPINAL STENOSIS, LUMBAR 05/13/2010  . HYPERLIPIDEMIA 04/24/2010  . GASTROESOPHAGEAL REFLUX DISEASE 04/24/2010  . VENTRAL HERNIA, INCISIONAL 04/24/2010  . DEGENERATIVE JOINT DISEASE 04/24/2010  . PYOGENIC ARTHRITIS, SHOULDER REGION 02/04/2010  . HYPERTENSION 01/01/2010  . RUPTURE ROTATOR CUFF 02/19/2009    End of Session Activity Tolerance: Patient tolerated treatment well General Behavior During Therapy: [facility] for tasks  assessed/performed  GO    Shirlean MylarBethany H. Murray, OTR/L  [phone]  01/20/2014, 2:13 PM

## 2014-01-23 ENCOUNTER — Ambulatory Visit (HOSPITAL_COMMUNITY)
Admission: RE | Admit: 2014-01-23 | Discharge: 2014-01-23 | Disposition: A | Discharge status: Home / Self Care | Payer: Medicare Other | Source: Ambulatory Visit | Attending: Pulmonary Disease | Admitting: Pulmonary Disease

## 2014-01-23 DIAGNOSIS — M25611 Stiffness of right shoulder, not elsewhere classified: Secondary | ICD-10-CM

## 2014-01-23 DIAGNOSIS — M6281 Muscle weakness (generalized): Secondary | ICD-10-CM

## 2014-01-23 DIAGNOSIS — M6289 Other specified disorders of muscle: Secondary | ICD-10-CM

## 2014-01-23 DIAGNOSIS — M629 Disorder of muscle, unspecified: Secondary | ICD-10-CM

## 2014-01-23 DIAGNOSIS — IMO0001 Reserved for inherently not codable concepts without codable children: Secondary | ICD-10-CM | POA: Diagnosis not present

## 2014-01-23 DIAGNOSIS — M25612 Stiffness of left shoulder, not elsewhere classified: Secondary | ICD-10-CM

## 2014-01-23 NOTE — Progress Notes (Signed)
Physical Therapy Re-evaluation  Patient Details  Name: [name] MRN: [id number] Date of Birth: 1941-05-18  Today's Date: 01/23/2014 Time: 0928-1018 PT Time Calculation (min): 50 min           Visit#: 22 of 24  Re-eval: 02/20/14 Assessment Diagnosis: HNP Authorization: Medicare    Authorization Visit#: 22 of 30    Subjective  Pt states he feels the weakness in his calf muscles is his major limitation.  Pt states his back hurts from time to time but not severe.  Currently without pain.    Objective RLE Strength Right Hip Flexion: 5/5 (was 4/5) Right Hip Extension: 2/5 (was 2/5) Right Hip ABduction: 3-/5 (was 2+/5) Right Knee Flexion: 2+/5 (was 2/5) Right Knee Extension: 5/5 (was 5/5) Right Ankle Dorsiflexion: 3+/5 (was 3+/5) Right Ankle Plantar Flexion: 1/5 (never formally assessed)  LLE AROM (degrees) Left Knee Extension: 0 (was 10 degrees)  LLE Strength Left Hip Flexion: 5/5 (was 4/5) Left Hip Extension: 2/5 (was 2/5) Left Hip ABduction: 2+/5 (was 2/5) Left Knee Flexion: 3+/5 (was 2/5) Left Knee Extension: 5/5 (was 5/5) Left Ankle Dorsiflexion: 0/5 (was 3/5) Left Ankle Plantar Flexion: 0/5 (never formally assessed)  Exercise/Treatments Sharlene Motts balance score 23/56 (was 19/56)    Physical Therapy Assessment and Plan PT Assessment and Plan Clinical Impression Statement: Pt has completed  22 visits s/p lumbar surgery and LE weakness.  Berg tested last visit with increase in 4 points since 5/27.  Pt has met all STG's and progressing towards LTG's.  LE strength has increased in 50% of groups with Greatest functional impairment is bilateral gastroc and Lt AT weakness.  Pt eager to try NMES to increase muscular response.  Discussed with evaluation therapist who will address with patient next visit.  PT Frequency: Min 3X/week PT Duration: 4 weeks PT Plan: Recommend continuing PT for 4 more weeks to work on remaining deficits.  Patient and evaluating therapist to discuss  electrical stimulation.     Goals Home Exercise Program PT Goal: Perform Home Exercise Program - Progress: Progressing toward goal PT Short Term Goals PT Short Term Goal 1: Pt to increase Berg balace by 5 to decrease risk of falling PT Short Term Goal 1 - Progress: Met PT Short Term Goal 2: Pt to be walking for 10 mintues with rolling walker PT Short Term Goal 2 - Progress: Met PT Short Term Goal 3: Pt to be able to stand for a minute with no assistive device to allow easier grooming  PT Short Term Goal 3 - Progress: Met PT Long Term Goals PT Long Term Goal 1: Berg balance to be increased by 10 to allow decreased risk of falling PT Long Term Goal 1 - Progress: Progressing toward goal PT Long Term Goal 2: Pt to be ambulating with walker x 30 minutes PT Long Term Goal 2 - Progress: Progressing toward goal Long Term Goal 3: Pt to be able to stand without assistive device for five minutes to ease grooming Long Term Goal 3 Progress: Progressing toward goal Long Term Goal 4: Pt to be able to stand with walker for 15 minutes for socializing . Long Term Goal 4 Progress: Progressing toward goal PT Long Term Goal 5: strength of LE improved one grade to allow the above to occur Long Term Goal 5 Progress: Partly met  Problem List Patient Active Problem List   Diagnosis Date Noted  . Muscle weakness (generalized) 12/05/2013  . Tight fascia 12/05/2013  . Decreased range of motion of  right shoulder 12/05/2013  . Decreased range of motion of left shoulder 12/05/2013  . Bilateral leg weakness 11/21/2013  . Poor balance 11/21/2013  . Difficulty in walking(719.7) 11/21/2013  . Shoulder pain, left 09/12/2013  . Anemia, iron deficiency 09/09/2013  . Dyspnea 09/07/2013  . UTI (urinary tract infection) 09/07/2013  . Acute CHF (congestive heart failure) 09/07/2013  . Elevated troponin 09/07/2013  . Fever and chills 09/06/2013  . Altered mental status 09/05/2013  . Neurogenic bowel 08/22/2013  .  Cauda equina syndrome with neurogenic bladder 08/22/2013  . Gout flare 08/22/2013  . Postoperative wound infection 08/22/2013  . Lumbar degenerative disc disease 08/02/2013  . Post-operative pain 07/29/2013  . Osteonecrosis 05/05/2013  . Small bowel obstruction 04/17/2013  . Acute renal failure 04/17/2013  . Medial meniscus, posterior horn derangement 02/17/2013  . Knee sprain and strain 02/17/2013  . Trigger point with neck pain 03/18/2012  . Rotator cuff tear arthropathy of right shoulder 09/08/2011  . CAD in native artery 01/09/2011  . Ankle fracture 12/25/2010  . Contusion of shoulder, left 12/25/2010  . PEMPHIGUS VULGARIS 07/02/2010  . MRSA 05/13/2010  . PRURITUS 05/13/2010  . SPINAL STENOSIS, LUMBAR 05/13/2010  . HYPERLIPIDEMIA 04/24/2010  . GASTROESOPHAGEAL REFLUX DISEASE 04/24/2010  . VENTRAL HERNIA, INCISIONAL 04/24/2010  . DEGENERATIVE JOINT DISEASE 04/24/2010  . PYOGENIC ARTHRITIS, SHOULDER REGION 02/04/2010  . HYPERTENSION 01/01/2010  . RUPTURE ROTATOR CUFF 02/19/2009    PT - End of Session Equipment Utilized During Treatment: Gait belt Activity Tolerance: Patient tolerated treatment well;Patient limited by fatigue   Teena Irani, PTA/CLT 01/23/2014, 10:32 AM

## 2014-01-23 NOTE — Progress Notes (Signed)
Occupational Therapy Treatment Patient Details  Name: [name] MRN: [id number] Date of Birth: 01/18/1941  Today's Date: 01/23/2014 Time: 1019-1100 OT Time Calculation (min): 41 min MFR [id number] 16' Therex 1035-1100 25'  Visit#: 18 of 24  Re-eval: 01/30/14    Authorization: Medicare  Authorization Time Period: before 20th visit   Authorization Visit#: 18 of 20  Subjective Symptoms/Limitations Symptoms: S: The right shoulder woke me up last night. It was hurting.  Pain Assessment Currently in Pain?: No/denies  Precautions/Restrictions  Precautions Precautions: Back  Exercise/Treatments Supine Protraction: PROM;10 reps;Strengthening;15 reps Protraction Weight (lbs): 1 Horizontal ABduction: PROM;10 reps;Strengthening;15 reps Horizontal ABduction Weight (lbs): 1 External Rotation: PROM;10 reps;Strengthening;15 reps External Rotation Weight (lbs): 1 Internal Rotation: PROM;10 reps;Strengthening;15 reps Internal Rotation Weight (lbs): 1 Flexion: PROM;5 reps;Strengthening;15 reps Shoulder Flexion Weight (lbs): 1 ABduction: PROM;10 reps;Strengthening;15 reps Shoulder ABduction Weight (lbs): 1 ROM / Strengthening / Isometric Strengthening Cybex Press: 2.5 plate;15 reps Cybex Row: 2.5 plate;20 reps Wall Wash: 2' right arm, 2' left arm Proximal Shoulder Strengthening, Supine: 15X with 1#      Manual Therapy Manual Therapy: Myofascial release Myofascial Release: Myofascial release (MFR) to BILATERAL upper arm, anterior and posterior shoulder region to decrease fascial restrictions and promotote improved pain free range of motion. completed this date in fulture PRN  Occupational Therapy Assessment and Plan OT Assessment and Plan Clinical Impression Statement: A: Added Cybex row. Patient tolerated well. Pt was unable to complete proximal shoulder strengthening seated due to shoulder fatigue. OT Plan: P: Cont to work on increase Bilateral shoulder strength and  stability needed to complete daily tasks.    Goals Home Exercise Program PT Goal: Perform Home Exercise Program - Progress: Progressing toward goal  Problem List Patient Active Problem List   Diagnosis Date Noted  . Muscle weakness (generalized) 12/05/2013  . Tight fascia 12/05/2013  . Decreased range of motion of right shoulder 12/05/2013  . Decreased range of motion of left shoulder 12/05/2013  . Bilateral leg weakness 11/21/2013  . Poor balance 11/21/2013  . Difficulty in walking(719.7) 11/21/2013  . Shoulder pain, left 09/12/2013  . Anemia, iron deficiency 09/09/2013  . Dyspnea 09/07/2013  . UTI (urinary tract infection) 09/07/2013  . Acute CHF (congestive heart failure) 09/07/2013  . Elevated troponin 09/07/2013  . Fever and chills 09/06/2013  . Altered mental status 09/05/2013  . Neurogenic bowel 08/22/2013  . Cauda equina syndrome with neurogenic bladder 08/22/2013  . Gout flare 08/22/2013  . Postoperative wound infection 08/22/2013  . Lumbar degenerative disc disease 08/02/2013  . Post-operative pain 07/29/2013  . Osteonecrosis 05/05/2013  . Small bowel obstruction 04/17/2013  . Acute renal failure 04/17/2013  . Medial meniscus, posterior horn derangement 02/17/2013  . Knee sprain and strain 02/17/2013  . Trigger point with neck pain 03/18/2012  . Rotator cuff tear arthropathy of right shoulder 09/08/2011  . CAD in native artery 01/09/2011  . Ankle fracture 12/25/2010  . Contusion of shoulder, left 12/25/2010  . PEMPHIGUS VULGARIS 07/02/2010  . MRSA 05/13/2010  . PRURITUS 05/13/2010  . SPINAL STENOSIS, LUMBAR 05/13/2010  . HYPERLIPIDEMIA 04/24/2010  . GASTROESOPHAGEAL REFLUX DISEASE 04/24/2010  . VENTRAL HERNIA, INCISIONAL 04/24/2010  . DEGENERATIVE JOINT DISEASE 04/24/2010  . PYOGENIC ARTHRITIS, SHOULDER REGION 02/04/2010  . HYPERTENSION 01/01/2010  . RUPTURE ROTATOR CUFF 02/19/2009    End of Session Activity Tolerance: Patient tolerated treatment  well General Behavior During Therapy: [facility] for tasks assessed/performed   Limmie PatriciaLaura Essenmacher, OTR/L,CBIS   01/23/2014, 12:10 PM

## 2014-01-25 ENCOUNTER — Ambulatory Visit (HOSPITAL_COMMUNITY)
Admission: RE | Admit: 2014-01-25 | Discharge: 2014-01-25 | Disposition: A | Discharge status: Home / Self Care | Payer: Medicare Other | Source: Ambulatory Visit | Attending: Pulmonary Disease | Admitting: Pulmonary Disease

## 2014-01-25 DIAGNOSIS — I10 Essential (primary) hypertension: Secondary | ICD-10-CM | POA: Diagnosis not present

## 2014-01-25 DIAGNOSIS — R29898 Other symptoms and signs involving the musculoskeletal system: Secondary | ICD-10-CM | POA: Diagnosis not present

## 2014-01-25 DIAGNOSIS — IMO0001 Reserved for inherently not codable concepts without codable children: Secondary | ICD-10-CM | POA: Insufficient documentation

## 2014-01-25 DIAGNOSIS — M545 Low back pain, unspecified: Secondary | ICD-10-CM | POA: Insufficient documentation

## 2014-01-25 DIAGNOSIS — R262 Difficulty in walking, not elsewhere classified: Secondary | ICD-10-CM | POA: Diagnosis not present

## 2014-01-25 DIAGNOSIS — R2689 Other abnormalities of gait and mobility: Secondary | ICD-10-CM

## 2014-01-25 NOTE — Progress Notes (Signed)
Occupational Therapy Treatment Patient Details  Name: [name] MRN: [id number] Date of Birth: 26-Feb-1941  Today's Date: 01/25/2014 Time: 2683-4196 OT Time Calculation (min): 47 min Manual 1101-1116 (15') Therapeutic Exercises 1116-1148 (1')  Visit#: 19 of 24  Re-eval: 01/30/14    Authorization: Medicare  Authorization Time Period: before 20th visit   Authorization Visit#: 19 of 20  Subjective Symptoms/Limitations Symptoms: "They're working a little better." Pain Assessment Currently in Pain?: Yes Pain Score: 2  Pain Location: Shoulder Pain Orientation: Left Pain Type: Chronic pain  Precautions/Restrictions     Exercise/Treatments Supine Protraction: PROM;10 reps;Strengthening;15 reps Protraction Weight (lbs): 1 Horizontal ABduction: PROM;10 reps;Strengthening;15 reps Horizontal ABduction Weight (lbs): 1 External Rotation: PROM;10 reps;Strengthening;15 reps External Rotation Weight (lbs): 1 Internal Rotation: PROM;10 reps;Strengthening;15 reps Internal Rotation Weight (lbs): 1 Flexion: PROM;5 reps;Strengthening;15 reps Shoulder Flexion Weight (lbs): 1 ABduction: PROM;10 reps;Strengthening;15 reps Shoulder ABduction Weight (lbs): 1   ROM / Strengthening / Isometric Strengthening UBE (Upper Arm Bike): level 4 3' forward and 3' in reverse Cybex Press: 3 plate;20 reps Cybex Row: 3 plate;20 reps Proximal Shoulder Strengthening, Supine: 15X with 1# Proximal Shoulder Strengthening, Seated: 10x wth 1# weight   Manual Therapy Manual Therapy: Myofascial release Myofascial Release: Myofascial release (MFR) to LEFT (this date requests just left) upper arm, anterior and posterior shoulder region to decrease fascial restrictions and promotote improved pain free range of motion. completed this date in fulture PRN.    Occupational Therapy Assessment and Plan OT Assessment and Plan Clinical Impression Statement: continued to focus on strengthennig bilateral UE.  Pt  requresed MFR to left shoulder this session.  Able to solerate seated proximal shoulder strengthening this session.  Good tolerance of cybex press/row and UBE at level 4. OT Plan: P: Cont to work on increase Bilateral shoulder strength and stability needed to complete daily tasks. Complete DASH for gcode update. Re-Eval.   Goals Short Term Goals Short Term Goal 1: Pt will be educated on HEP Short Term Goal 1 Progress: Met Short Term Goal 2: Pt will achieve BUE shoulder AROM WFL in supine in working towards improving his golf swing. Short Term Goal 2 Progress: Met Short Term Goal 3: Pt will improve BUE strength to at least 4-/5 for improved ability to reach for with shaving supplies during morning ADLs. Short Term Goal 3 Progress: Progressing toward goal Short Term Goal 4: Pt will have decreased fascial restrictions to min-mod in BUE for decrased pain with motion. Short Term Goal 4 Progress: Met Short Term Goal 5: Pt will improve bilateral grip strength by at least 5# each. Short Term Goal 5 Progress: Met Long Term Goals Long Term Goal 1: Pt will achieve highest level of functioning with all ADLs, IADLs, and leisure tasks when using BUE. Long Term Goal 1 Progress: Progressing toward goal Long Term Goal 2: Pt will achieve BUE shoulder AROM WFL in standing in working towards improving his golf swing. Long Term Goal 2 Progress: Progressing toward goal Long Term Goal 3: Pt will improve BUE shoulder strength to at least 4/5 for improved ability to reach for items off of shelves. Long Term Goal 3 Progress: Progressing toward goal Long Term Goal 4: Pt will have decreased fascial restictions to trace-min in BUE. Long Term Goal 4 Progress: Progressing toward goal Long Term Goal 5: Pt will improve bilateral grip strength by at least 10# each. Long Term Goal 5 Progress: Met Long Term Goal 6: Pt will verbalize less than 1/10 pain in his shoulder with movement.  Long Term Goal 6 Progress: Met  Problem  List Patient Active Problem List   Diagnosis Date Noted  . Muscle weakness (generalized) 12/05/2013  . Tight fascia 12/05/2013  . Decreased range of motion of right shoulder 12/05/2013  . Decreased range of motion of left shoulder 12/05/2013  . Bilateral leg weakness 11/21/2013  . Poor balance 11/21/2013  . Difficulty in walking(719.7) 11/21/2013  . Shoulder pain, left 09/12/2013  . Anemia, iron deficiency 09/09/2013  . Dyspnea 09/07/2013  . UTI (urinary tract infection) 09/07/2013  . Acute CHF (congestive heart failure) 09/07/2013  . Elevated troponin 09/07/2013  . Fever and chills 09/06/2013  . Altered mental status 09/05/2013  . Neurogenic bowel 08/22/2013  . Cauda equina syndrome with neurogenic bladder 08/22/2013  . Gout flare 08/22/2013  . Postoperative wound infection 08/22/2013  . Lumbar degenerative disc disease 08/02/2013  . Post-operative pain 07/29/2013  . Osteonecrosis 05/05/2013  . Small bowel obstruction 04/17/2013  . Acute renal failure 04/17/2013  . Medial meniscus, posterior horn derangement 02/17/2013  . Knee sprain and strain 02/17/2013  . Trigger point with neck pain 03/18/2012  . Rotator cuff tear arthropathy of right shoulder 09/08/2011  . CAD in native artery 01/09/2011  . Ankle fracture 12/25/2010  . Contusion of shoulder, left 12/25/2010  . PEMPHIGUS VULGARIS 07/02/2010  . MRSA 05/13/2010  . PRURITUS 05/13/2010  . SPINAL STENOSIS, LUMBAR 05/13/2010  . HYPERLIPIDEMIA 04/24/2010  . GASTROESOPHAGEAL REFLUX DISEASE 04/24/2010  . VENTRAL HERNIA, INCISIONAL 04/24/2010  . DEGENERATIVE JOINT DISEASE 04/24/2010  . PYOGENIC ARTHRITIS, SHOULDER REGION 02/04/2010  . HYPERTENSION 01/01/2010  . RUPTURE ROTATOR CUFF 02/19/2009    End of Session Activity Tolerance: Patient tolerated treatment well General Behavior During Therapy: [facility] for tasks assessed/performed  GO    Bea Graff, MS, OTR/L [phone]  01/25/2014, 11:54 AM

## 2014-01-25 NOTE — Progress Notes (Signed)
Physical Therapy Treatment Patient Details  Name: Jake MarvelRonald W Tappan MRN: [id number] Date of Birth: 09/14/1940  Today's Date: 01/25/2014 Time: 1015-1059 PT Time Calculation (min): 44 min Charge:  Self care 10:15-1032; electrical stimulation 1033-10:50; there ex 10:50-10:59 Visit#: 23 of 24  Re-eval: 02/20/14    Authorization: Medicare   Authorization Visit#: 23 of 30   Subjective: Symptoms/Limitations Symptoms: Pt states he is anxious to try the e-stim on his LE Pain Assessment Currently in Pain?: No/denies Pain Score: 2  Pain Location: Shoulder Pain Orientation: Left Pain Type: Chronic pain    Exercise/Treatments   Standing Other Standing Knee Exercises: standing no assistive device keeping wt =  Seated Other Seated Knee Exercises: 10 sit to stand Supine Other Supine Knee Exercises: ankle DF/PF with electrical stimulation B x 20 reps each    Manual Therapy Manual Therapy: Myofascial release Myofascial Release: Myofascial release (MFR) to LEFT (this date requests just left) upper arm, anterior and posterior shoulder region to decrease fascial restrictions and promotote improved pain free range of motion. completed this date in fulture PRN.    Physical Therapy Assessment and Plan PT Assessment and Plan Clinical Impression Statement: Instructed pt on the use of E-stim to stimulate B dorsiflexion and plantarflexion mm.  Pt able to eliicit motion with dorsiflexion and plantarflexion on his Rt LE; Lt LE able to elicit a contraction only.  [facility] . Pt will benefit from skilled therapeutic intervention in order to improve on the following deficits: Increased muscle spasms PT Plan: Electirical stimulation for home use; continue to work on balance and strength in department.      Problem List Patient Active Problem List   Diagnosis Date Noted  . Muscle weakness (generalized) 12/05/2013  . Tight fascia 12/05/2013  .  Decreased range of motion of right shoulder 12/05/2013  . Decreased range of motion of left shoulder 12/05/2013  . Bilateral leg weakness 11/21/2013  . Poor balance 11/21/2013  . Difficulty in walking(719.7) 11/21/2013  . Shoulder pain, left 09/12/2013  . Anemia, iron deficiency 09/09/2013  . Dyspnea 09/07/2013  . UTI (urinary tract infection) 09/07/2013  . Acute CHF (congestive heart failure) 09/07/2013  . Elevated troponin 09/07/2013  . Fever and chills 09/06/2013  . Altered mental status 09/05/2013  . Neurogenic bowel 08/22/2013  . Cauda equina syndrome with neurogenic bladder 08/22/2013  . Gout flare 08/22/2013  . Postoperative wound infection 08/22/2013  . Lumbar degenerative disc disease 08/02/2013  . Post-operative pain 07/29/2013  . Osteonecrosis 05/05/2013  . Small bowel obstruction 04/17/2013  . Acute renal failure 04/17/2013  . Medial meniscus, posterior horn derangement 02/17/2013  . Knee sprain and strain 02/17/2013  . Trigger point with neck pain 03/18/2012  . Rotator cuff tear arthropathy of right shoulder 09/08/2011  . CAD in native artery 01/09/2011  . Ankle fracture 12/25/2010  . Contusion of shoulder, left 12/25/2010  . PEMPHIGUS VULGARIS 07/02/2010  . MRSA 05/13/2010  . PRURITUS 05/13/2010  . SPINAL STENOSIS, LUMBAR 05/13/2010  . HYPERLIPIDEMIA 04/24/2010  . GASTROESOPHAGEAL REFLUX DISEASE 04/24/2010  . VENTRAL HERNIA, INCISIONAL 04/24/2010  . DEGENERATIVE JOINT DISEASE 04/24/2010  . PYOGENIC ARTHRITIS, SHOULDER REGION 02/04/2010  . HYPERTENSION 01/01/2010  . RUPTURE ROTATOR CUFF 02/19/2009    General Behavior During Therapy: [facility] for tasks assessed/performed  GP    RUSSELL,CINDY 01/25/2014, 12:20 PM

## 2014-01-30 ENCOUNTER — Ambulatory Visit (HOSPITAL_COMMUNITY)
Admission: RE | Admit: 2014-01-30 | Discharge: 2014-01-30 | Disposition: A | Discharge status: Home / Self Care | Payer: Medicare Other | Source: Ambulatory Visit | Attending: Pulmonary Disease | Admitting: Pulmonary Disease

## 2014-01-30 DIAGNOSIS — M6281 Muscle weakness (generalized): Secondary | ICD-10-CM

## 2014-01-30 DIAGNOSIS — M25612 Stiffness of left shoulder, not elsewhere classified: Secondary | ICD-10-CM

## 2014-01-30 DIAGNOSIS — M25611 Stiffness of right shoulder, not elsewhere classified: Secondary | ICD-10-CM

## 2014-01-30 DIAGNOSIS — R262 Difficulty in walking, not elsewhere classified: Secondary | ICD-10-CM

## 2014-01-30 DIAGNOSIS — M629 Disorder of muscle, unspecified: Secondary | ICD-10-CM

## 2014-01-30 DIAGNOSIS — R29898 Other symptoms and signs involving the musculoskeletal system: Secondary | ICD-10-CM

## 2014-01-30 DIAGNOSIS — IMO0001 Reserved for inherently not codable concepts without codable children: Secondary | ICD-10-CM | POA: Diagnosis not present

## 2014-01-30 DIAGNOSIS — M6289 Other specified disorders of muscle: Secondary | ICD-10-CM

## 2014-01-30 DIAGNOSIS — R2689 Other abnormalities of gait and mobility: Secondary | ICD-10-CM

## 2014-01-30 NOTE — Evaluation (Signed)
Occupational Therapy Reassessment, Treatment, and Discharge  Patient Details  Name: [name] MRN: [id number] Date of Birth: 11/29/1940  Today's Date: 01/30/2014 Time: 1010-1050 OT Time Calculation (min): 40 min Reassess 1010-1035 30' Therex 1035-1050  10' Visit#: 20 of 24  Re-eval:    Assessment Diagnosis: Bilateral Upper Extremity Weakness  Authorization: Medicare  Authorization Time Period: before 20th visit   Authorization Visit#: 34 of 28   Past Medical History:  Past Medical History  Diagnosis Date  . Hyperlipidemia   . Small bowel problem     HAD ALOT OF SCAR TISSUE FROM PREVIOUS SURGERIES..NG WAS INSERTED ...Marland KitchenMarland KitchenNO SURGERY NEEDED...Marland KitchenMarland KitchenIN FOR 8 DAYS  . Disorder of blood     BEEN TREATED BY DERMATOLOGIST X 4 YRS..."BLOOD BLISTERS"  . Arthritis   . Gout   . Anxiety   . Hx MRSA infection     rt shoulder  . Pneumonia     "I've had it 3-4 times"  . Coronary artery disease     IN 2000   STENT PLACED IN 2012 sees Dr. Lattie Haw, saw last 2013  . HTN (hypertension)     sees Dr. [name] in [place]  . Small bowel obstruction   . Ventral hernia   . Acute renal failure 04/17/2013  . 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 Anemia, iron deficiency 09/09/2013   Past Surgical History:  Past Surgical History  Procedure Laterality Date  . Sternal surg  2000    HAS HAD 5-6 ON HIS STERNUM; "caught MRSA in it"  . Lumbar laminectomy/decompression microdiscectomy  06/13/2011    Procedure: LUMBAR LAMINECTOMY/DECOMPRESSION MICRODISCECTOMY;  Surgeon: Floyce Stakes;  Location: [facility];  Service: Neurosurgery;  Laterality: N/A;  Lumbar three, lumbar four-five Laminectomy  . Eye surgery      bilateral cataract  . Anterior cervical corpectomy  12/17/11  . Incision and drainage of wound  ~ 20ll; 12/03/11    "had infection in my right"  . Shoulder open rotator cuff repair  ~ 2011    right  . Peripherally inserted central catheter insertion  2011 & 11/2011  .  Cataract extraction w/ intraocular lens  implant, bilateral  ? 2011  . Coronary artery bypass graft  2000    CABG X5  . Back surgery      lumbar  . Tonsillectomy  1949  . Cholecystectomy  2006 "or after"  . Coronary angioplasty with stent placement  2012  . Anterior cervical corpectomy  12/17/2011    Procedure: ANTERIOR CERVICAL CORPECTOMY;  Surgeon: Floyce Stakes, MD;  Location: [place] [facility];  Service: Neurosurgery;  Laterality: N/A;  Anterior Cervical Decompression Fusion Five to Thoracic Two with plating  . Lumbar laminectomy/decompression microdiscectomy Right 06/17/2013    Procedure: LUMBAR LAMINECTOMY/DECOMPRESSION MICRODISCECTOMY 1 LEVEL;  Surgeon: Floyce Stakes, MD;  Location: [place] [facility];  Service: Neurosurgery;  Laterality: Right;  Right L3-4 Microdiskectomy  . Lumbar wound debridement N/A 08/02/2013    Procedure: INCISION AND DRAINAGE OF LUMBAR WOUND DEBRIDEMENT;  Surgeon: Floyce Stakes, MD;  Location: [facility];  Service: Neurosurgery;  Laterality: N/A;  . Shoulder arthroscopy Left 09/13/2013    Procedure: ARTHROSCOPIC IRRIGATION AND DEBRIDEMENT, SYNOVECTOMY ,  LEFT SHOULDER ;  Surgeon: Yvette Rack., MD;  Location: [facility];  Service: Orthopedics;  Laterality: Left;    Subjective Symptoms/Limitations Symptoms: S: I feel like I have really improved.  Pain Assessment Currently in Pain?: No/denies  Precautions/Restrictions  Precautions  Precautions: Back   Assessment Additional Assessments RUE AROM (degrees) RUE Overall AROM Comments: assessed seated. IR/ER adducted Right Shoulder Flexion: 157 Degrees (last progress note: 150) Right Shoulder ABduction: 136 Degrees (last progress note: 66) Right Shoulder Internal Rotation: 90 Degrees (same on last progress note) Right Shoulder External Rotation: 90 Degrees (last progress note: 80) RUE Strength RUE Overall Strength Comments: Assess in sitting, with shoulder adducted ER/IR Right Shoulder Flexion: 3+/5 (last  progress note: 3-/5) Right Shoulder ABduction: 3+/5 (last progress note: 3-/5) Right Shoulder Internal Rotation:  (4+/5 (last progress note: 4-/5)) Right Shoulder External Rotation: 5/5 (last progress note: 4-/5) LUE AROM (degrees) LUE Overall AROM Comments: Assessed seated/ IR/ER adducted Left Shoulder Flexion: 125 Degrees (last progress note: 63) Left Shoulder ABduction: 70 Degrees (last progress note: 65) Left Shoulder Internal Rotation: 90 Degrees (same at last progress note) Left Shoulder External Rotation: 90 Degrees (last progress note: 85) LUE Strength LUE Overall Strength Comments: Assessed in sitting Left Shoulder Flexion: 3/5 (last progress note: 3-/5) Left Shoulder ABduction: 3/5 (last progress note: 3-/5) Left Shoulder Internal Rotation: 4/5 (last progress note: 4-/5) Left Shoulder External Rotation: 5/5 (last progress note: 4-/5) Palpation Palpation: Trace fascial restrictions on bilateral bicep region, upper arm, trapezius, and scapularis region.     Exercise/Treatments ROM / Strengthening / Isometric Strengthening UBE (Upper Arm Bike): Level 4 4' forward 4' reverse Cybex Press: 3 plate;20 reps Cybex Row: 3 plate;20 reps         Occupational Therapy Assessment and Plan OT Assessment and Plan Clinical Impression Statement: A: Reassessment and discharge completed this date. Patient has met 36/5 STGs with one other one partly met and 4/6 LTGs with 2 partly met. Patient was given a strengthening HEP and was educated on exercises to complete on own at the fitness center. Patient is agreeable to discharge.  OT Plan: P: D/C from therapy.   Goals Short Term Goals Short Term Goal 1: Pt will be educated on HEP Short Term Goal 2: Pt will achieve BUE shoulder AROM WFL in supine in working towards improving his golf swing. Short Term Goal 3: Pt will improve BUE strength to at least 4-/5 for improved ability to reach for with shaving supplies during morning ADLs. Short Term  Goal 3 Progress: Partly met Short Term Goal 4: Pt will have decreased fascial restrictions to min-mod in BUE for decrased pain with motion. Short Term Goal 5: Pt will improve bilateral grip strength by at least 5# each. Long Term Goals Long Term Goal 1: Pt will achieve highest level of functioning with all ADLs, IADLs, and leisure tasks when using BUE. Long Term Goal 1 Progress: Partly met Long Term Goal 2: Pt will achieve BUE shoulder AROM WFL in standing in working towards improving his golf swing. Long Term Goal 2 Progress: Met Long Term Goal 3: Pt will improve BUE shoulder strength to at least 4/5 for improved ability to reach for items off of shelves. Long Term Goal 3 Progress: Partly met Long Term Goal 4: Pt will have decreased fascial restictions to trace-min in BUE. Long Term Goal 4 Progress: Met Long Term Goal 5: Pt will improve bilateral grip strength by at least 10# each. Long Term Goal 6: Pt will verbalize less than 1/10 pain in his shoulder with movement.  Problem List Patient Active Problem List   Diagnosis Date Noted  . Muscle weakness (generalized) 12/05/2013  . Tight fascia 12/05/2013  . Decreased range of motion of right shoulder 12/05/2013  . Decreased range  of motion of left shoulder 12/05/2013  . Bilateral leg weakness 11/21/2013  . Poor balance 11/21/2013  . Difficulty in walking(719.7) 11/21/2013  . Shoulder pain, left 09/12/2013  . Anemia, iron deficiency 09/09/2013  . Dyspnea 09/07/2013  . UTI (urinary tract infection) 09/07/2013  . Acute CHF (congestive heart failure) 09/07/2013  . Elevated troponin 09/07/2013  . Fever and chills 09/06/2013  . Altered mental status 09/05/2013  . Neurogenic bowel 08/22/2013  . Cauda equina syndrome with neurogenic bladder 08/22/2013  . Gout flare 08/22/2013  . Postoperative wound infection 08/22/2013  . Lumbar degenerative disc disease 08/02/2013  . Post-operative pain 07/29/2013  . Osteonecrosis 05/05/2013  . Small  bowel obstruction 04/17/2013  . Acute renal failure 04/17/2013  . Medial meniscus, posterior horn derangement 02/17/2013  . Knee sprain and strain 02/17/2013  . Trigger point with neck pain 03/18/2012  . Rotator cuff tear arthropathy of right shoulder 09/08/2011  . CAD in native artery 01/09/2011  . Ankle fracture 12/25/2010  . Contusion of shoulder, left 12/25/2010  . PEMPHIGUS VULGARIS 07/02/2010  . MRSA 05/13/2010  . PRURITUS 05/13/2010  . SPINAL STENOSIS, LUMBAR 05/13/2010  . HYPERLIPIDEMIA 04/24/2010  . GASTROESOPHAGEAL REFLUX DISEASE 04/24/2010  . VENTRAL HERNIA, INCISIONAL 04/24/2010  . DEGENERATIVE JOINT DISEASE 04/24/2010  . PYOGENIC ARTHRITIS, SHOULDER REGION 02/04/2010  . HYPERTENSION 01/01/2010  . RUPTURE ROTATOR CUFF 02/19/2009    End of Session Activity Tolerance: Patient tolerated treatment well General Behavior During Therapy: [facility] for tasks assessed/performed OT Plan of Care OT Home Exercise Plan: Strengthening exercises  OT Patient Instructions: handout (scanned) Consulted and Agree with Plan of Care: Patient  GO Functional Assessment Tool Used: DASH score: 45.5 with ideal score of 0. Last progresss note: 29 Functional Limitation: Carrying, moving and handling objects Carrying, Moving and Handling Objects Current Status [phone]): At least 40 percent but less than 60 percent impaired, limited or restricted Carrying, Moving and Handling Objects Goal Status [phone]): At least 1 percent but less than 20 percent impaired, limited or restricted Carrying, Moving and Handling Objects Discharge Status [phone]): At least 40 percent but less than 60 percent impaired, limited or restricted  Ailene Ravel, OTR/L,CBIS   01/30/2014, 10:57 AM  Physician Documentation Your signature is required to indicate approval of the treatment plan as stated above.  Please sign and either send electronically or make a copy of this report for your files and return this physician signed  original.  Please mark one 1.__approve of plan  2. ___approve of plan with the following conditions.   ______________________________                                                          _____________________ Physician Signature  Date  

## 2014-01-30 NOTE — Progress Notes (Signed)
Physical Therapy Treatment Patient Details  Name: Jake MarvelRonald W Felan MRN: [id number] Date of Birth: 08/19/1940  Today's Date: 01/30/2014 Time: 1055-1200 PT Time Calculation (min): 65 min Charge: NMR 1055-1111, TE 7829-56211116-1200  Visit#: 24 of 30  Re-eval: 02/20/14 Assessment Diagnosis: HNP Next MD Visit: Dr Juanetta GoslingHawkins 3-4 weeks  Authorization: Medicare  Authorization Time Period:    Authorization Visit#: 24 of 30   Subjective: Symptoms/Limitations Symptoms: Pt stated he has not received call with estim, currently pain free today. Pain Assessment Currently in Pain?: No/denies  Precautions/Restrictions  Precautions Precautions: Back  Exercise/Treatments Aerobic Stationary Bike: NuStep x10' L5 hills #4 to improve strength and activity tolerance; SPM 140  Standing Forward Lunges: 10 reps;Both;Limitations Forward Lunges Limitations: onto 6in step Side Lunges: Both;10 reps;Limitations Side Lunges Limitations: on 6in step Functional Squat: 20 reps;Other (comment) (increase depth with squats for gluteal strengtheing) Other Standing Knee Exercises: Weight shifting 15x Other Standing Knee Exercises: standing no assistive device keeping wt =   Modalities Modalities: Electrical Stimulation Electrical Stimulation Electrical Stimulation Location: Rt gastrocnemius; Lt anterior tibalis and gastrocnemius Electrical Stimulation Action: Muscle re-education  Electrical Stimulation Parameters: Russian estim forRt plantar flexion cycle time: 10/20 single channel for Rt;  Co-contractio for Lt dorsi and plantarflexion cycle time 10/20 x 8 min each LE Electrical Stimulation Goals: Strength  Physical Therapy Assessment and Plan PT Assessment and Plan Clinical Impression Statement: Continued estim for NMR for Rt plantar flexion and Lt dorsiflexion and plantarflexion/.  Pt able to actively dorsiflexion independently, noted elicit motion with plantar flexion Rt LE with estim assistance.  Lt LE able to  elicit a contraction with dorsi and plantar flexion, AAROM.  Pt given contact information for [facility].  Session focus on functional strengthening and balance  with therapist faciliation for form and technique.   PT Plan: Electirical stimulation for home use; continue to work on balance and strength in department.     Goals PT Long Term Goals PT Long Term Goal 1: Berg balance to be increased by 10 to allow decreased risk of falling PT Long Term Goal 1 - Progress: Progressing toward goal PT Long Term Goal 2: Pt to be ambulating with walker x 30 minutes PT Long Term Goal 2 - Progress: Progressing toward goal Long Term Goal 3: Pt to be able to stand without assistive device for five minutes to ease grooming Long Term Goal 3 Progress: Progressing toward goal Long Term Goal 4: Pt to be able to stand with walker for 15 minutes for socializing . Long Term Goal 4 Progress: Progressing toward goal PT Long Term Goal 5: strength of LE improved one grade to allow the above to occur Long Term Goal 5 Progress: Progressing toward goal  Problem List Patient Active Problem List   Diagnosis Date Noted  . Muscle weakness (generalized) 12/05/2013  . Tight fascia 12/05/2013  . Decreased range of motion of right shoulder 12/05/2013  . Decreased range of motion of left shoulder 12/05/2013  . Bilateral leg weakness 11/21/2013  . Poor balance 11/21/2013  . Difficulty in walking(719.7) 11/21/2013  . Shoulder pain, left 09/12/2013  . Anemia, iron deficiency 09/09/2013  . Dyspnea 09/07/2013  . UTI (urinary tract infection) 09/07/2013  . Acute CHF (congestive heart failure) 09/07/2013  . Elevated troponin 09/07/2013  . Fever and chills 09/06/2013  . Altered mental status 09/05/2013  . Neurogenic bowel 08/22/2013  . Cauda equina syndrome with neurogenic bladder 08/22/2013  . Gout flare 08/22/2013  . Postoperative wound infection 08/22/2013  . Lumbar  degenerative disc disease 08/02/2013  . Post-operative  pain 07/29/2013  . Osteonecrosis 05/05/2013  . Small bowel obstruction 04/17/2013  . Acute renal failure 04/17/2013  . Medial meniscus, posterior horn derangement 02/17/2013  . Knee sprain and strain 02/17/2013  . Trigger point with neck pain 03/18/2012  . Rotator cuff tear arthropathy of right shoulder 09/08/2011  . CAD in native artery 01/09/2011  . Ankle fracture 12/25/2010  . Contusion of shoulder, left 12/25/2010  . PEMPHIGUS VULGARIS 07/02/2010  . MRSA 05/13/2010  . PRURITUS 05/13/2010  . SPINAL STENOSIS, LUMBAR 05/13/2010  . HYPERLIPIDEMIA 04/24/2010  . GASTROESOPHAGEAL REFLUX DISEASE 04/24/2010  . VENTRAL HERNIA, INCISIONAL 04/24/2010  . DEGENERATIVE JOINT DISEASE 04/24/2010  . PYOGENIC ARTHRITIS, SHOULDER REGION 02/04/2010  . HYPERTENSION 01/01/2010  . RUPTURE ROTATOR CUFF 02/19/2009    PT - End of Session Equipment Utilized During Treatment: Gait belt Activity Tolerance: Patient tolerated treatment well;Patient limited by fatigue General Behavior During Therapy: [facility] for tasks assessed/performed  GP    Juel BurrowCockerham, Kipp Shank Jo 01/30/2014, 12:04 PM

## 2014-02-01 ENCOUNTER — Telehealth (HOSPITAL_COMMUNITY): Payer: Self-pay

## 2014-02-01 ENCOUNTER — Inpatient Hospital Stay (HOSPITAL_COMMUNITY): Admission: RE | Admit: 2014-02-01 | Payer: Medicare Other | Source: Ambulatory Visit | Admitting: Physical Therapy

## 2014-02-01 DIAGNOSIS — N390 Urinary tract infection, site not specified: Secondary | ICD-10-CM | POA: Diagnosis not present

## 2014-02-03 ENCOUNTER — Ambulatory Visit (HOSPITAL_COMMUNITY)
Admission: RE | Admit: 2014-02-03 | Discharge: 2014-02-03 | Disposition: A | Discharge status: Home / Self Care | Payer: Medicare Other | Source: Ambulatory Visit | Attending: Pulmonary Disease | Admitting: Pulmonary Disease

## 2014-02-03 DIAGNOSIS — IMO0001 Reserved for inherently not codable concepts without codable children: Secondary | ICD-10-CM | POA: Diagnosis not present

## 2014-02-03 NOTE — Progress Notes (Signed)
Physical Therapy Treatment Patient Details  Name: [name] MRN: [id number] Date of Birth: 1941-04-25  Today's Date: 02/03/2014 Time: 4098-1191 PT Time Calculation (min): 46 min TE 4782-9562, NMR with estim 1308-6578  Visit#: 25 of 30  Re-eval: 02/20/14 Assessment Diagnosis: HNP   Subjective: Symptoms/Limitations Symptoms: Pt reports pain in the Lt ankle, without known injury.  He reports some swelling into the ankle, and has been wearing his compression stocking since yesterday.   Pain Assessment Currently in Pain?: Yes Pain Score: 5  Pain Location: Ankle Pain Orientation: Left Pain Type: Chronic pain   Exercise/Treatments Standing Forward Lunges: 10 reps;5 seconds;Both Forward Lunges Limitations: onto 6in step Side Lunges: 10 reps;5 seconds;Both Side Lunges Limitations: on 6in step Seated Other Seated Knee Exercises: Sit <-> Stand x10 Supine Straight Leg Raises: 2 sets;10 reps Straight Leg Raises Limitations: 2# each LE   Electrical Stimulation Electrical Stimulation Location: Rt gastrocnemius; Lt anterior tibalis and gastrocnemius Electrical Stimulation Action: Muscle re-education Electrical Stimulation Parameters: Russian estime for Rt in plantar flexion with cycle 10/20, Lt in co contraction for dorsi/plantarflexion with cycle 10/20 for 8 minutes each LE Electrical Stimulation Goals: Strength  Physical Therapy Assessment and Plan PT Assessment and Plan Clinical Impression Statement: VC with exercises for lunging to decrease use of UE for returning to stand to build up LE strength, with the addition of 5 second hold while in lunge position.  Pt reports mild complaints of pain in ankle today, with noted swelling; pt reports he has been wearing his compression stocking to decrease swelling.   Pt reports he feels like the estim is "shocking" his nerves into incrasing activiation and reports he feels like it is helping.   Pt will benefit from skilled therapeutic  intervention in order to improve on the following deficits: Increased muscle spasms;Abnormal gait;Decreased balance Rehab Potential: Good PT Frequency: Min 3X/week PT Duration: 4 weeks PT Treatment/Interventions: Gait training;Therapeutic activities;Therapeutic exercise;Patient/family education;Neuromuscular re-education;Balance training PT Plan: Electirical stimulation for home use as pt recieves unit; continue to work on balance and strength in department.      Problem List Patient Active Problem List   Diagnosis Date Noted  . Muscle weakness (generalized) 12/05/2013  . Tight fascia 12/05/2013  . Decreased range of motion of right shoulder 12/05/2013  . Decreased range of motion of left shoulder 12/05/2013  . Bilateral leg weakness 11/21/2013  . Poor balance 11/21/2013  . Difficulty in walking(719.7) 11/21/2013  . Shoulder pain, left 09/12/2013  . Anemia, iron deficiency 09/09/2013  . Dyspnea 09/07/2013  . UTI (urinary tract infection) 09/07/2013  . Acute CHF (congestive heart failure) 09/07/2013  . Elevated troponin 09/07/2013  . Fever and chills 09/06/2013  . Altered mental status 09/05/2013  . Neurogenic bowel 08/22/2013  . Cauda equina syndrome with neurogenic bladder 08/22/2013  . Gout flare 08/22/2013  . Postoperative wound infection 08/22/2013  . Lumbar degenerative disc disease 08/02/2013  . Post-operative pain 07/29/2013  . Osteonecrosis 05/05/2013  . Small bowel obstruction 04/17/2013  . Acute renal failure 04/17/2013  . Medial meniscus, posterior horn derangement 02/17/2013  . Knee sprain and strain 02/17/2013  . Trigger point with neck pain 03/18/2012  . Rotator cuff tear arthropathy of right shoulder 09/08/2011  . CAD in native artery 01/09/2011  . Ankle fracture 12/25/2010  . Contusion of shoulder, left 12/25/2010  . PEMPHIGUS VULGARIS 07/02/2010  . MRSA 05/13/2010  . PRURITUS 05/13/2010  . SPINAL STENOSIS, LUMBAR 05/13/2010  . HYPERLIPIDEMIA 04/24/2010   . GASTROESOPHAGEAL REFLUX DISEASE 04/24/2010  .  VENTRAL HERNIA, INCISIONAL 04/24/2010  . DEGENERATIVE JOINT DISEASE 04/24/2010  . PYOGENIC ARTHRITIS, SHOULDER REGION 02/04/2010  . HYPERTENSION 01/01/2010  . RUPTURE ROTATOR CUFF 02/19/2009    PT - End of Session Equipment Utilized During Treatment: Gait belt Activity Tolerance: Patient tolerated treatment well General Behavior During Therapy: [facility] for tasks assessed/performed   Shaely Gadberry 02/03/2014, 2:53 PM

## 2014-02-06 ENCOUNTER — Telehealth (HOSPITAL_COMMUNITY): Payer: Self-pay

## 2014-02-06 ENCOUNTER — Ambulatory Visit (HOSPITAL_COMMUNITY): Payer: Medicare Other

## 2014-02-08 ENCOUNTER — Telehealth (HOSPITAL_COMMUNITY): Payer: Self-pay

## 2014-02-08 ENCOUNTER — Inpatient Hospital Stay (HOSPITAL_COMMUNITY): Admission: RE | Admit: 2014-02-08 | Payer: Medicare Other | Source: Ambulatory Visit

## 2014-02-10 ENCOUNTER — Ambulatory Visit (HOSPITAL_COMMUNITY)
Admission: RE | Admit: 2014-02-10 | Discharge: 2014-02-10 | Disposition: A | Discharge status: Home / Self Care | Payer: Medicare Other | Source: Ambulatory Visit | Attending: Pulmonary Disease | Admitting: Pulmonary Disease

## 2014-02-10 DIAGNOSIS — IMO0001 Reserved for inherently not codable concepts without codable children: Secondary | ICD-10-CM | POA: Diagnosis not present

## 2014-02-10 DIAGNOSIS — R29898 Other symptoms and signs involving the musculoskeletal system: Secondary | ICD-10-CM

## 2014-02-10 DIAGNOSIS — R2689 Other abnormalities of gait and mobility: Secondary | ICD-10-CM

## 2014-02-10 DIAGNOSIS — R262 Difficulty in walking, not elsewhere classified: Secondary | ICD-10-CM

## 2014-02-10 NOTE — Progress Notes (Signed)
Physical Therapy Treatment Patient Details  Name: Jake MarvelRonald W Iyer MRN: [id number] Date of Birth: 12/21/1940  Today's Date: 02/10/2014 Time: 4098-11911304-1348 PT Time Calculation (min): 44 min Charge: TE [id number], NMR [id number]  Visit#: 26 of 30  Re-eval: 02/20/14 Assessment Diagnosis: HNP Next MD Visit: Dr Juanetta GoslingHawkins  Authorization: Medicare  Authorization Time Period:    Authorization Visit#: 26 of 30   Subjective: Symptoms/Limitations Symptoms: Pt stated he got bladder infection, given wrong medication.  Current LBP scale 4/10 Pain Assessment Currently in Pain?: Yes Pain Score: 4  Pain Location: Back  Precautions/Restrictions  Precautions Precautions: Back  Exercise/Treatments Standing Other Standing Knee Exercises: Weight shifting 15x Other Standing Knee Exercises: standing no assistive device keeping wt = max time 30" Seated Long Arc Quad: Both;15 reps;Weights Long Arc Quad Weight: 8 lbs. Other Seated Knee Exercises: Sit <-> Stand x10 Supine Bridges: 20 reps;Limitations Bridges Limitations: with blue tband around knees for abduction during bridge Other Supine Knee Exercises: Clam with redtband resistance for glut med 15x each Sidelying Clams: AROM 10x each LE with red tband    Electrical Stimulation Electrical Stimulation Location: Rt gastrocnemius; Lt anterior tibalis and gastrocnemius Electrical Stimulation Action: Muscle re-education Electrical Stimulation Parameters: Russian estime for Rt in plantar flexion with cycle 10/20, Lt in co contraction for dorsi/plantarflexion with cycle 10/20 for 8 minutes each LE Electrical Stimulation Goals: Neuromuscular facilitation;Strength  Physical Therapy Assessment and Plan PT Assessment and Plan Clinical Impression Statement: Pt with decreased overall tolerance for standing this session due to back pain.  Pt able to stand for 30" max with 5 attempt.  Session focus on gluteal strengthening and NMR for dorsi and plantarflexion  with [place] estim.  [facility]  Contacted company and should receive home unit next week.   PT Plan: Continue to work on balance and strengthening in dept per PT POC.  Should recieve home unit by Wednesday, educate pt. for home unit use when recieve.    Goals PT Long Term Goals PT Long Term Goal 1: Berg balance to be increased by 10 to allow decreased risk of falling PT Long Term Goal 1 - Progress: Progressing toward goal PT Long Term Goal 2: Pt to be ambulating with walker x 30 minutes PT Long Term Goal 2 - Progress: Progressing toward goal Long Term Goal 3: Pt to be able to stand without assistive device for five minutes to ease grooming Long Term Goal 3 Progress: Progressing toward goal Long Term Goal 4: Pt to be able to stand with walker for 15 minutes for socializing . PT Long Term Goal 5: strength of LE improved one grade to allow the above to occur Long Term Goal 5 Progress: Progressing toward goal  Problem List Patient Active Problem List   Diagnosis Date Noted  . Muscle weakness (generalized) 12/05/2013  . Tight fascia 12/05/2013  . Decreased range of motion of right shoulder 12/05/2013  . Decreased range of motion of left shoulder 12/05/2013  . Bilateral leg weakness 11/21/2013  . Poor balance 11/21/2013  . Difficulty in walking(719.7) 11/21/2013  . Shoulder pain, left 09/12/2013  . Anemia, iron deficiency 09/09/2013  . Dyspnea 09/07/2013  . UTI (urinary tract infection) 09/07/2013  . Acute CHF (congestive heart failure) 09/07/2013  . Elevated troponin 09/07/2013  . Fever and chills 09/06/2013  . Altered mental status 09/05/2013  . Neurogenic bowel 08/22/2013  . Cauda equina syndrome with neurogenic bladder 08/22/2013  . Gout flare 08/22/2013  .  Postoperative wound infection 08/22/2013  . Lumbar degenerative disc disease 08/02/2013  . Post-operative pain 07/29/2013  . Osteonecrosis 05/05/2013  . Small bowel  obstruction 04/17/2013  . Acute renal failure 04/17/2013  . Medial meniscus, posterior horn derangement 02/17/2013  . Knee sprain and strain 02/17/2013  . Trigger point with neck pain 03/18/2012  . Rotator cuff tear arthropathy of right shoulder 09/08/2011  . CAD in native artery 01/09/2011  . Ankle fracture 12/25/2010  . Contusion of shoulder, left 12/25/2010  . PEMPHIGUS VULGARIS 07/02/2010  . MRSA 05/13/2010  . PRURITUS 05/13/2010  . SPINAL STENOSIS, LUMBAR 05/13/2010  . HYPERLIPIDEMIA 04/24/2010  . GASTROESOPHAGEAL REFLUX DISEASE 04/24/2010  . VENTRAL HERNIA, INCISIONAL 04/24/2010  . DEGENERATIVE JOINT DISEASE 04/24/2010  . PYOGENIC ARTHRITIS, SHOULDER REGION 02/04/2010  . HYPERTENSION 01/01/2010  . RUPTURE ROTATOR CUFF 02/19/2009    PT - End of Session Equipment Utilized During Treatment: Gait belt Activity Tolerance: Patient limited by fatigue;Patient limited by pain;Patient tolerated treatment well General Behavior During Therapy: [facility] for tasks assessed/performed PT Plan of Care PT Patient Instructions: [employer] home estim unit contact #[phone]  GP    Juel Burrow 02/10/2014, 2:25 PM

## 2014-02-13 ENCOUNTER — Ambulatory Visit (HOSPITAL_COMMUNITY)
Admission: RE | Admit: 2014-02-13 | Discharge: 2014-02-13 | Disposition: A | Discharge status: Home / Self Care | Payer: Medicare Other | Source: Ambulatory Visit | Attending: Pulmonary Disease | Admitting: Pulmonary Disease

## 2014-02-13 DIAGNOSIS — IMO0001 Reserved for inherently not codable concepts without codable children: Secondary | ICD-10-CM | POA: Diagnosis not present

## 2014-02-13 NOTE — Progress Notes (Signed)
Physical Therapy Treatment Patient Details  Name: Jake MarvelRonald W Casad MRN: [id number] Date of Birth: 03/03/1941  Today's Date: 02/13/2014 Time: 0935-1020 PT Time Calculation (min): 45 min Visit#: 27 of 30  Re-eval: 02/20/14 Authorization: Medicare  Authorization Visit#: 27 of 30  Charges:  therex 40  Subjective: Symptoms/Limitations Symptoms: Pt states he stil feels weak.  Pt reports he still has not gotten his UTI under control and has been given another antibiotic.  Pain Assessment Currently in Pain?: Yes Pain Score: 4  Pain Location: Back Pain Orientation: Left  Precautions/Restrictions     Exercise/Treatments Aerobic Stationary Bike: NuStep x10' L5 hills #4 to improve strength and activity tolerance; SPM 140  Standing Forward Lunges: 10 reps;5 seconds;Both Forward Lunges Limitations: onto 8in step Side Lunges: 10 reps;5 seconds;Both Side Lunges Limitations: on 8in step Functional Squat: 20 reps;Other (comment) Other Standing Knee Exercises: standing no assistive device keeping wt = max time 15" Seated Long Arc Quad: Both;2 sets;10 reps;Limitations Long Arc Quad Weight: 10 lbs. Other Seated Knee Exercises: Sit <-> Stand x10    Physical Therapy Assessment and Plan PT Assessment and Plan Clinical Impression Statement: Pt contacted by [employer] for home estim unit and should receive tomorrow or Wednesday.  Pt with difficulty standing greater than 15" without UE assist.  continued to focus on increasing LE strength.  Pt with decreased activity tolerance requiring several rest breaks.  REsumed nustep to increase activity tolerance.   PT Plan: Continue to work on balance and strengthening in dept per PT POC.  Should recieve home unit by Wednesday, educate pt. for home unit use when recieve.     Problem List Patient Active Problem List   Diagnosis Date Noted  . Muscle weakness (generalized) 12/05/2013  . Tight fascia 12/05/2013  . Decreased range of motion of right shoulder  12/05/2013  . Decreased range of motion of left shoulder 12/05/2013  . Bilateral leg weakness 11/21/2013  . Poor balance 11/21/2013  . Difficulty in walking(719.7) 11/21/2013  . Shoulder pain, left 09/12/2013  . Anemia, iron deficiency 09/09/2013  . Dyspnea 09/07/2013  . UTI (urinary tract infection) 09/07/2013  . Acute CHF (congestive heart failure) 09/07/2013  . Elevated troponin 09/07/2013  . Fever and chills 09/06/2013  . Altered mental status 09/05/2013  . Neurogenic bowel 08/22/2013  . Cauda equina syndrome with neurogenic bladder 08/22/2013  . Gout flare 08/22/2013  . Postoperative wound infection 08/22/2013  . Lumbar degenerative disc disease 08/02/2013  . Post-operative pain 07/29/2013  . Osteonecrosis 05/05/2013  . Small bowel obstruction 04/17/2013  . Acute renal failure 04/17/2013  . Medial meniscus, posterior horn derangement 02/17/2013  . Knee sprain and strain 02/17/2013  . Trigger point with neck pain 03/18/2012  . Rotator cuff tear arthropathy of right shoulder 09/08/2011  . CAD in native artery 01/09/2011  . Ankle fracture 12/25/2010  . Contusion of shoulder, left 12/25/2010  . PEMPHIGUS VULGARIS 07/02/2010  . MRSA 05/13/2010  . PRURITUS 05/13/2010  . SPINAL STENOSIS, LUMBAR 05/13/2010  . HYPERLIPIDEMIA 04/24/2010  . GASTROESOPHAGEAL REFLUX DISEASE 04/24/2010  . VENTRAL HERNIA, INCISIONAL 04/24/2010  . DEGENERATIVE JOINT DISEASE 04/24/2010  . PYOGENIC ARTHRITIS, SHOULDER REGION 02/04/2010  . HYPERTENSION 01/01/2010  . RUPTURE ROTATOR CUFF 02/19/2009    PT - End of Session Equipment Utilized During Treatment: Gait belt Activity Tolerance: Patient limited by fatigue;Patient limited by pain;Patient tolerated treatment well General Behavior During Therapy: [facility] for tasks assessed/performed PT Plan of Care PT Patient Instructions: [employer] home estim unit contact #[phone]  GP  Lurena Nida, PTA/CLT 02/13/2014, 10:13 AM

## 2014-02-15 ENCOUNTER — Ambulatory Visit (HOSPITAL_COMMUNITY)
Admission: RE | Admit: 2014-02-15 | Discharge: 2014-02-15 | Disposition: A | Discharge status: Home / Self Care | Payer: Medicare Other | Source: Ambulatory Visit | Attending: Pulmonary Disease | Admitting: Pulmonary Disease

## 2014-02-15 DIAGNOSIS — IMO0001 Reserved for inherently not codable concepts without codable children: Secondary | ICD-10-CM | POA: Diagnosis not present

## 2014-02-15 NOTE — Progress Notes (Signed)
Physical Therapy Treatment Patient Details  Name: [name] MRN: [id number] Date of Birth: 02/03/1941  Today's Date: 02/15/2014 Time: 1020-1106 PT Time Calculation (min): 46 min  Visit#: 28 of 30  Re-eval: 02/20/14 Authorization: Medicare  Authorization Visit#: 28 of 30  Charges:  therex 1020-1055 (35'), self care 1055-1105 (10')  Subjective: Symptoms/Limitations Symptoms: Pt with wife today for appointment to instruct in electrode placement for new unit.  Pt reports no pain or discomfort.  Pain Assessment Currently in Pain?: No/denies   Exercise/Treatments Aerobic Stationary Bike: NuStep x10' L5 hills #4 to improve strength and activity tolerance; SPM 140  Standing Forward Lunges: 10 reps;5 seconds;Both Forward Lunges Limitations: onto 8in step Side Lunges: 10 reps;5 seconds;Both Side Lunges Limitations: on 8in step Functional Squat: 20 reps;Other (comment)     Physical Therapy Assessment and Plan PT Assessment and Plan Clinical Impression Statement: Unit not here today.  Educated wife on basics of unit (unsure if patient will receive same unit) and electrode placement for tibialis anterior and gastroc musculature.  Also given written illustration.  Company contacted regarding shipment and was told it would be here tomorrow.   PT Plan: Continue to work on balance and strengthening in dept per PT POC.  Should receive unit next visit; review set up with patient and educate spouse.  Re-eval X 2more visits.      Problem List Patient Active Problem List   Diagnosis Date Noted  . Muscle weakness (generalized) 12/05/2013  . Tight fascia 12/05/2013  . Decreased range of motion of right shoulder 12/05/2013  . Decreased range of motion of left shoulder 12/05/2013  . Bilateral leg weakness 11/21/2013  . Poor balance 11/21/2013  . Difficulty in walking(719.7) 11/21/2013  . Shoulder pain, left 09/12/2013  . Anemia, iron deficiency 09/09/2013  . Dyspnea 09/07/2013  . UTI  (urinary tract infection) 09/07/2013  . Acute CHF (congestive heart failure) 09/07/2013  . Elevated troponin 09/07/2013  . Fever and chills 09/06/2013  . Altered mental status 09/05/2013  . Neurogenic bowel 08/22/2013  . Cauda equina syndrome with neurogenic bladder 08/22/2013  . Gout flare 08/22/2013  . Postoperative wound infection 08/22/2013  . Lumbar degenerative disc disease 08/02/2013  . Post-operative pain 07/29/2013  . Osteonecrosis 05/05/2013  . Small bowel obstruction 04/17/2013  . Acute renal failure 04/17/2013  . Medial meniscus, posterior horn derangement 02/17/2013  . Knee sprain and strain 02/17/2013  . Trigger point with neck pain 03/18/2012  . Rotator cuff tear arthropathy of right shoulder 09/08/2011  . CAD in native artery 01/09/2011  . Ankle fracture 12/25/2010  . Contusion of shoulder, left 12/25/2010  . PEMPHIGUS VULGARIS 07/02/2010  . MRSA 05/13/2010  . PRURITUS 05/13/2010  . SPINAL STENOSIS, LUMBAR 05/13/2010  . HYPERLIPIDEMIA 04/24/2010  . GASTROESOPHAGEAL REFLUX DISEASE 04/24/2010  . VENTRAL HERNIA, INCISIONAL 04/24/2010  . DEGENERATIVE JOINT DISEASE 04/24/2010  . PYOGENIC ARTHRITIS, SHOULDER REGION 02/04/2010  . HYPERTENSION 01/01/2010  . RUPTURE ROTATOR CUFF 02/19/2009    PT - End of Session Equipment Utilized During Treatment: Gait belt Activity Tolerance: Patient limited by fatigue;Patient limited by pain;Patient tolerated treatment well General Behavior During Therapy: [facility] for tasks assessed/performed PT Plan of Care PT Patient Instructions: [employer] home estim unit contact #[phone]  GP    Lurena NidaAmy B Vash Quezada, PTA/CLT 02/15/2014, 3:23 PM

## 2014-02-17 ENCOUNTER — Ambulatory Visit (HOSPITAL_COMMUNITY)
Admission: RE | Admit: 2014-02-17 | Discharge: 2014-02-17 | Disposition: A | Discharge status: Home / Self Care | Payer: Medicare Other | Source: Ambulatory Visit | Attending: Pulmonary Disease | Admitting: Pulmonary Disease

## 2014-02-17 DIAGNOSIS — R29898 Other symptoms and signs involving the musculoskeletal system: Secondary | ICD-10-CM

## 2014-02-17 DIAGNOSIS — R262 Difficulty in walking, not elsewhere classified: Secondary | ICD-10-CM

## 2014-02-17 DIAGNOSIS — R2689 Other abnormalities of gait and mobility: Secondary | ICD-10-CM

## 2014-02-17 DIAGNOSIS — IMO0001 Reserved for inherently not codable concepts without codable children: Secondary | ICD-10-CM | POA: Diagnosis not present

## 2014-02-17 NOTE — Progress Notes (Signed)
Physical Therapy Treatment Patient Details  Name: [name] MRN: [id number] Date of Birth: 1941/05/20  Today's Date: 02/17/2014 Time: 1100-1145 PT Time Calculation (min): 45 min  Charges: Estim: 1, Self-care/homemanagement training [phone] Visit#: 29 of 30  Re-eval: 02/20/14 Assessment Diagnosis: HNP Next MD Visit: Dr Juanetta Gosling  Authorization: Medicare  Authorization Visit#: 29 of 30   Subjective: Symptoms/Limitations Symptoms: Patient arrives with wife statign his neuromuscular electrical stimulationmachine came in and he and his wife want to learn how to use it at home as he contineus to have difficulty plantar flexing and dorsiflexing his Lt foot and difficulty platar flexing his Rt foot.  Pain Assessment Currently in Pain?: No/denies  Precautions/Restrictions  Precautions Precautions: Back  Exercise/Treatments Museum/gallery exhibitions officer Stimulation Location: Rt gastrocnemius; Lt anterior tibalis and gastrocnemius Electrical Stimulation Action: Muscle re-education Electrical Stimulation Parameters: Russian estime for Rt in plantar flexion with cycle 10/20, Lt in co contraction for dorsi/plantarflexion with cycle 10/20 for 5 minutes each LE Electrical Stimulation Goals: Neuromuscular facilitation;Strength  Physical Therapy Assessment and Plan PT Assessment and Plan Clinical Impression Statement: This session focused on education of use of home neuromuscular Evlectrical stimulation (NMES) machine as patient recently purcased one and wants to be able to perform NMES independently at home prior to impending discharge as patient progresses towards his goals. correct pad placement  and setttings were demosntrated with patient and wife both displaying independence in performance of  NMES follwoing therapist education with correct demosntration.  PT Plan: Re-assessment next session for possible discharge.     Goals PT Long Term Goals PT Long Term Goal 1: Berg balance  to be increased by 10 to allow decreased risk of falling PT Long Term Goal 1 - Progress: Progressing toward goal PT Long Term Goal 2: Pt to be ambulating with walker x 30 minutes PT Long Term Goal 2 - Progress: Progressing toward goal Long Term Goal 3: Pt to be able to stand without assistive device for five minutes to ease grooming Long Term Goal 3 Progress: Progressing toward goal Long Term Goal 4: Pt to be able to stand with walker for 15 minutes for socializing . Long Term Goal 4 Progress: Progressing toward goal PT Long Term Goal 5: strength of LE improved one grade to allow the above to occur Long Term Goal 5 Progress: Progressing toward goal  Problem List Patient Active Problem List   Diagnosis Date Noted  . Muscle weakness (generalized) 12/05/2013  . Tight fascia 12/05/2013  . Decreased range of motion of right shoulder 12/05/2013  . Decreased range of motion of left shoulder 12/05/2013  . Bilateral leg weakness 11/21/2013  . Poor balance 11/21/2013  . Difficulty in walking(719.7) 11/21/2013  . Shoulder pain, left 09/12/2013  . Anemia, iron deficiency 09/09/2013  . Dyspnea 09/07/2013  . UTI (urinary tract infection) 09/07/2013  . Acute CHF (congestive heart failure) 09/07/2013  . Elevated troponin 09/07/2013  . Fever and chills 09/06/2013  . Altered mental status 09/05/2013  . Neurogenic bowel 08/22/2013  . Cauda equina syndrome with neurogenic bladder 08/22/2013  . Gout flare 08/22/2013  . Postoperative wound infection 08/22/2013  . Lumbar degenerative disc disease 08/02/2013  . Post-operative pain 07/29/2013  . Osteonecrosis 05/05/2013  . Small bowel obstruction 04/17/2013  . Acute renal failure 04/17/2013  . Medial meniscus, posterior horn derangement 02/17/2013  . Knee sprain and strain 02/17/2013  . Trigger point with neck pain 03/18/2012  . Rotator cuff tear arthropathy of right shoulder 09/08/2011  . CAD in native  artery 01/09/2011  . Ankle fracture  12/25/2010  . Contusion of shoulder, left 12/25/2010  . PEMPHIGUS VULGARIS 07/02/2010  . MRSA 05/13/2010  . PRURITUS 05/13/2010  . SPINAL STENOSIS, LUMBAR 05/13/2010  . HYPERLIPIDEMIA 04/24/2010  . GASTROESOPHAGEAL REFLUX DISEASE 04/24/2010  . VENTRAL HERNIA, INCISIONAL 04/24/2010  . DEGENERATIVE JOINT DISEASE 04/24/2010  . PYOGENIC ARTHRITIS, SHOULDER REGION 02/04/2010  . HYPERTENSION 01/01/2010  . RUPTURE ROTATOR CUFF 02/19/2009    PT - End of Session Activity Tolerance: Patient tolerated treatment well General Behavior During Therapy: [facility] for tasks assessed/performed  GP    Nichole Keltner R 02/17/2014, 2:56 PM

## 2014-02-20 ENCOUNTER — Ambulatory Visit (HOSPITAL_COMMUNITY)
Admission: RE | Admit: 2014-02-20 | Discharge: 2014-02-20 | Disposition: A | Discharge status: Home / Self Care | Payer: Medicare Other | Source: Ambulatory Visit | Attending: Pulmonary Disease | Admitting: Pulmonary Disease

## 2014-02-20 DIAGNOSIS — IMO0001 Reserved for inherently not codable concepts without codable children: Secondary | ICD-10-CM | POA: Diagnosis not present

## 2014-02-20 NOTE — Progress Notes (Signed)
Physical Therapy Treatment Patient Details  Name: [name] MRN: [id number] Date of Birth: 06/09/1941  Today's Date: 02/20/2014 Time: 1115-1200 PT Time Calculation (min): 45 min  Visit#: 30 of 30  Re-eval: 02/20/14 Authorization: Medicare  Authorization Visit#: 30 of 30  Charges:  Self care 40  Subjective: Symptoms/Limitations Symptoms: Pt and wife come in today with NMES unit.  Wife states she cannot get it to work right and needs help.  Patient reports no pain or difficulties today.  Physical Therapy Assessment and Plan PT Assessment and Plan Clinical Impression Statement: Educated wife and patient with unit and given step-by-step instructions for operation.  Had spouse set up treatment X 2 sessions and able to demonstrate correctly.  Unable to complete formal re-evaluation as took all session for patient education.  PT Plan: Re-assessment next session for possible discharge.  complete foto as well.   Problem List Patient Active Problem List   Diagnosis Date Noted  . Muscle weakness (generalized) 12/05/2013  . Tight fascia 12/05/2013  . Decreased range of motion of right shoulder 12/05/2013  . Decreased range of motion of left shoulder 12/05/2013  . Bilateral leg weakness 11/21/2013  . Poor balance 11/21/2013  . Difficulty in walking(719.7) 11/21/2013  . Shoulder pain, left 09/12/2013  . Anemia, iron deficiency 09/09/2013  . Dyspnea 09/07/2013  . UTI (urinary tract infection) 09/07/2013  . Acute CHF (congestive heart failure) 09/07/2013  . Elevated troponin 09/07/2013  . Fever and chills 09/06/2013  . Altered mental status 09/05/2013  . Neurogenic bowel 08/22/2013  . Cauda equina syndrome with neurogenic bladder 08/22/2013  . Gout flare 08/22/2013  . Postoperative wound infection 08/22/2013  . Lumbar degenerative disc disease 08/02/2013  . Post-operative pain 07/29/2013  . Osteonecrosis 05/05/2013  . Small bowel obstruction 04/17/2013  . Acute renal failure  04/17/2013  . Medial meniscus, posterior horn derangement 02/17/2013  . Knee sprain and strain 02/17/2013  . Trigger point with neck pain 03/18/2012  . Rotator cuff tear arthropathy of right shoulder 09/08/2011  . CAD in native artery 01/09/2011  . Ankle fracture 12/25/2010  . Contusion of shoulder, left 12/25/2010  . PEMPHIGUS VULGARIS 07/02/2010  . MRSA 05/13/2010  . PRURITUS 05/13/2010  . SPINAL STENOSIS, LUMBAR 05/13/2010  . HYPERLIPIDEMIA 04/24/2010  . GASTROESOPHAGEAL REFLUX DISEASE 04/24/2010  . VENTRAL HERNIA, INCISIONAL 04/24/2010  . DEGENERATIVE JOINT DISEASE 04/24/2010  . PYOGENIC ARTHRITIS, SHOULDER REGION 02/04/2010  . HYPERTENSION 01/01/2010  . RUPTURE ROTATOR CUFF 02/19/2009    PT - End of Session Activity Tolerance: Patient tolerated treatment well General Behavior During Therapy: WFL for tasks assessed/performed  GP Functional Assessment Tool Used: foto (not done this visit), therapist observation Functional Limitation: Mobility: Walking and moving around Mobility: Walking and Moving Around Current Status ([id number]): At least 40 percent but less than 60 percent impaired, limited or restricted Mobility: Walking and Moving Around Goal Status [phone](G8979): At least 40 percent but less than 60 percent impaired, limited or restricted  Lurena NidaAmy B Kimiah Hibner, PTA/CLT 02/20/2014, 2:35 PM

## 2014-02-22 ENCOUNTER — Telehealth (HOSPITAL_COMMUNITY): Payer: Self-pay

## 2014-02-22 ENCOUNTER — Ambulatory Visit (HOSPITAL_COMMUNITY): Payer: Medicare Other | Admitting: Physical Therapy

## 2014-02-24 ENCOUNTER — Ambulatory Visit (HOSPITAL_COMMUNITY): Payer: Medicare Other | Admitting: Physical Therapy

## 2014-02-24 ENCOUNTER — Telehealth (HOSPITAL_COMMUNITY): Payer: Self-pay

## 2014-03-07 ENCOUNTER — Ambulatory Visit (HOSPITAL_COMMUNITY): Payer: Medicare Other | Admitting: Physical Therapy

## 2014-03-15 ENCOUNTER — Ambulatory Visit (HOSPITAL_COMMUNITY)
Admission: RE | Admit: 2014-03-15 | Discharge: 2014-03-15 | Disposition: A | Discharge status: Home / Self Care | Payer: Medicare Other | Source: Ambulatory Visit | Attending: Pulmonary Disease | Admitting: Pulmonary Disease

## 2014-03-15 DIAGNOSIS — I10 Essential (primary) hypertension: Secondary | ICD-10-CM | POA: Diagnosis not present

## 2014-03-15 DIAGNOSIS — R262 Difficulty in walking, not elsewhere classified: Secondary | ICD-10-CM | POA: Insufficient documentation

## 2014-03-15 DIAGNOSIS — R2689 Other abnormalities of gait and mobility: Secondary | ICD-10-CM

## 2014-03-15 DIAGNOSIS — M545 Low back pain, unspecified: Secondary | ICD-10-CM | POA: Insufficient documentation

## 2014-03-15 DIAGNOSIS — R29898 Other symptoms and signs involving the musculoskeletal system: Secondary | ICD-10-CM | POA: Diagnosis not present

## 2014-03-15 DIAGNOSIS — IMO0001 Reserved for inherently not codable concepts without codable children: Secondary | ICD-10-CM | POA: Diagnosis not present

## 2014-03-15 NOTE — Evaluation (Signed)
Physical Therapy Reevaluation  Patient Details  Name: [name] MRN: [id number] Date of Birth: 06-Nov-1940  Today's Date: 03/15/2014 Time: 1300-1345 PT Time Calculation (min): 45 min Charge:  Mm test 3382-5053; there ex 1315-1330; self care [id number]             Visit#: 52 of 63  Re-eval: 02/20/14 Assessment Diagnosis: HNP Next MD Visit: [name]  Authorization: Medicare    Authorization Time Period:    Authorization Visit#: 45 of 81   Past Medical History:  Past Medical History  Diagnosis Date  . Hyperlipidemia   . Small bowel problem     HAD ALOT OF SCAR TISSUE FROM PREVIOUS SURGERIES..NG WAS INSERTED ...Marland KitchenMarland KitchenNO SURGERY NEEDED...Marland KitchenMarland KitchenIN FOR 8 DAYS  . Disorder of blood     BEEN TREATED BY DERMATOLOGIST X 4 YRS..."BLOOD BLISTERS"  . Arthritis   . Gout   . Anxiety   . Hx MRSA infection     rt shoulder  . Pneumonia     "I've had it 3-4 times"  . Coronary artery disease     IN 2000   STENT PLACED IN 2012 sees Dr. Lattie Haw, saw last 2013  . HTN (hypertension)     sees Dr. [name] in [place]  . Small bowel obstruction   . Ventral hernia   . Acute renal failure 04/17/2013  . 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 Anemia, iron deficiency 09/09/2013   Past Surgical History:  Past Surgical History  Procedure Laterality Date  . Sternal surg  2000    HAS HAD 5-6 ON HIS STERNUM; "caught MRSA in it"  . Lumbar laminectomy/decompression microdiscectomy  06/13/2011    Procedure: LUMBAR LAMINECTOMY/DECOMPRESSION MICRODISCECTOMY;  Surgeon: Floyce Stakes;  Location: [place] [facility];  Service: Neurosurgery;  Laterality: N/A;  Lumbar three, lumbar four-five Laminectomy  . Eye surgery      bilateral cataract  . Anterior cervical corpectomy  12/17/11  . Incision and drainage of wound  ~ 20ll; 12/03/11    "had infection in my right"  . Shoulder open rotator cuff repair  ~ 2011    right  . Peripherally inserted central catheter insertion  2011 & 11/2011  . Cataract  extraction w/ intraocular lens  implant, bilateral  ? 2011  . Coronary artery bypass graft  2000    CABG X5  . Back surgery      lumbar  . Tonsillectomy  1949  . Cholecystectomy  2006 "or after"  . Coronary angioplasty with stent placement  2012  . Anterior cervical corpectomy  12/17/2011    Procedure: ANTERIOR CERVICAL CORPECTOMY;  Surgeon: Floyce Stakes, MD;  Location: [place] [facility];  Service: Neurosurgery;  Laterality: N/A;  Anterior Cervical Decompression Fusion Five to Thoracic Two with plating  . Lumbar laminectomy/decompression microdiscectomy Right 06/17/2013    Procedure: LUMBAR LAMINECTOMY/DECOMPRESSION MICRODISCECTOMY 1 LEVEL;  Surgeon: Floyce Stakes, MD;  Location: [place] [facility];  Service: Neurosurgery;  Laterality: Right;  Right L3-4 Microdiskectomy  . Lumbar wound debridement N/A 08/02/2013    Procedure: INCISION AND DRAINAGE OF LUMBAR WOUND DEBRIDEMENT;  Surgeon: Floyce Stakes, MD;  Location: [place] [facility];  Service: Neurosurgery;  Laterality: N/A;  . Shoulder arthroscopy Left 09/13/2013    Procedure: ARTHROSCOPIC IRRIGATION AND DEBRIDEMENT, SYNOVECTOMY ,  LEFT SHOULDER ;  Surgeon: Yvette Rack., MD;  Location: [facility];  Service: Orthopedics;  Laterality: Left;    Subjective Symptoms/Limitations Symptoms: Pt states just getting up and  going is the hardest thing for him to do. Pt has not been going to the gym but states he is going to go back starting Friday.   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 He states that he had his strenum removed and then they used mm from his right and left arm for flaps.   How long can you sit comfortably?: Pt can sit for a long period of time  How long can you stand comfortably?: Pt is able to stand without the walker for four seconds was three seconds.   Pt is able to stand with the walker 6 minutes was three  How long can you walk comfortably?: Pt is able to walk for five minutes with  the walker was 4  Patient Stated Goals: I want to drive.  Pain Assessment Currently in Pain?: No/denies (was 4/10) Pain Score: 4  Pain Location: Back Pain Orientation: Left  Precautions/Restrictions  Precautions Precautions: Back  Assessment RLE Strength Right Hip Flexion: 5/5 (was 5/5) Right Hip Extension: 3-/5 (was 2/5) Right Hip ABduction: 3-/5 (was 3-/5) Right Knee Flexion: 2+/5 (was 2+/5) Right Knee Extension: 5/5 (was 5/5) Right Ankle Dorsiflexion: 3+/5 (was 3+/5) Right Ankle Plantar Flexion: 2-/5 (was 15) LLE Strength Left Hip Flexion: 5/5 (was 5/5) Left Hip Extension: 2+/5 (was 2/5) Left Hip ABduction: 2+/5 (was 2+5) Left Knee Flexion: 3+/5 ( was 3+/5) Left Knee Extension: 5/5 (was 5/5) Left Ankle Dorsiflexion: 1/5 (was 1/5) Left Ankle Plantar Flexion: 1/5 (was 0/5)  Exercise/Treatments Mobility/Balance  Berg Balance Test Sit to Stand: Able to stand  independently using hands Standing Unsupported: Able to stand 2 minutes with supervision Sitting with Back Unsupported but Feet Supported on Floor or Stool: Able to sit safely and securely 2 minutes Stand to Sit: Controls descent by using hands Transfers: Able to transfer safely, definite need of hands Standing Unsupported with Eyes Closed: Needs help to keep from falling Standing Ubsupported with Feet Together: Needs help to attain position and unable to hold for 15 seconds From Standing, Reach Forward with Outstretched Arm: Loses balance while trying/requires external support From Standing Position, Pick up Object from Floor: Able to pick up shoe, needs supervision From Standing Position, Turn to Look Behind Over each Shoulder: Needs assist to keep from losing balance and falling Turn 360 Degrees: Needs assistance while turning Standing Unsupported, Alternately Place Feet on Step/Stool: Needs assistance to keep from falling or unable to try Standing Unsupported, One Foot in Front: Loses balance while stepping or  standing Standing on One Leg: Unable to try or needs assist to prevent fall Total Score: 19       Physical Therapy Assessment and Plan PT Assessment and Plan Clinical Impression Statement: Pt has not been to therapy for 3 weeks. Pt states he became depressed and quit doing anything including going to the gym due to being depressed that his functional mobility was not improving greater than it was.  There has been limitied change secondary to pt not completing any exercises for three weeks but pt verbalized that he understands the importance and need to continue to exercise every day.  We will continue to see pt one more mont to see if he improves in function.  Pt was given a star survivor ship book even though he does not have cancer as this book is good for any person who has gone through a life altering illness.   PT Plan: Go over star answers,  stress the importance of increasing steps every weekl.  Work on [employer].    Goals Home Exercise Program Pt/caregiver will Perform Home Exercise Program: For increased ROM;For increased strengthening PT Goal: Perform Home Exercise Program - Progress: Progressing toward goal PT Short Term Goals Time to Complete Short Term Goals: 2 weeks PT Short Term Goal 1: Pt to increase Berg balace by 5 to decrease risk of falling PT Short Term Goal 1 - Progress: Not met PT Short Term Goal 2: Pt to be walking for 10 mintues with rolling walker PT Short Term Goal 2 - Progress: Not met PT Short Term Goal 3: Pt to be able to stand for a minute with no assistive device to allow easier grooming  PT Short Term Goal 3 - Progress: Not met PT Long Term Goals Time to Complete Long Term Goals: 4 weeks PT Long Term Goal 1: Berg balance to be increased by 10 to allow decreased risk of falling PT Long Term Goal 1 - Progress: Not met PT Long Term Goal 2: Pt to be ambulating with walker x 30 minutes PT Long Term Goal 2 - Progress: Not met Long Term Goal 3: Pt  to be able to stand without assistive device for five minutes to ease grooming Long Term Goal 3 Progress: Not met Long Term Goal 4: Pt to be able to stand with walker for 15 minutes for socializing . Long Term Goal 4 Progress: Not met PT Long Term Goal 5: strength of LE improved one grade to allow the above to occur Long Term Goal 5 Progress: Progressing toward goal  Problem List Patient Active Problem List   Diagnosis Date Noted  . Muscle weakness (generalized) 12/05/2013  . Tight fascia 12/05/2013  . Decreased range of motion of right shoulder 12/05/2013  . Decreased range of motion of left shoulder 12/05/2013  . Bilateral leg weakness 11/21/2013  . Poor balance 11/21/2013  . Difficulty in walking(719.7) 11/21/2013  . Shoulder pain, left 09/12/2013  . Anemia, iron deficiency 09/09/2013  . Dyspnea 09/07/2013  . UTI (urinary tract infection) 09/07/2013  . Acute CHF (congestive heart failure) 09/07/2013  . Elevated troponin 09/07/2013  . Fever and chills 09/06/2013  . Altered mental status 09/05/2013  . Neurogenic bowel 08/22/2013  . Cauda equina syndrome with neurogenic bladder 08/22/2013  . Gout flare 08/22/2013  . Postoperative wound infection 08/22/2013  . Lumbar degenerative disc disease 08/02/2013  . Post-operative pain 07/29/2013  . Osteonecrosis 05/05/2013  . Small bowel obstruction 04/17/2013  . Acute renal failure 04/17/2013  . Medial meniscus, posterior horn derangement 02/17/2013  . Knee sprain and strain 02/17/2013  . Trigger point with neck pain 03/18/2012  . Rotator cuff tear arthropathy of right shoulder 09/08/2011  . CAD in native artery 01/09/2011  . Ankle fracture 12/25/2010  . Contusion of shoulder, left 12/25/2010  . PEMPHIGUS VULGARIS 07/02/2010  . MRSA 05/13/2010  . PRURITUS 05/13/2010  . SPINAL STENOSIS, LUMBAR 05/13/2010  . HYPERLIPIDEMIA 04/24/2010  . GASTROESOPHAGEAL REFLUX DISEASE 04/24/2010  . VENTRAL HERNIA, INCISIONAL 04/24/2010  .  DEGENERATIVE JOINT DISEASE 04/24/2010  . PYOGENIC ARTHRITIS, SHOULDER REGION 02/04/2010  . HYPERTENSION 01/01/2010  . RUPTURE ROTATOR CUFF 02/19/2009       GP Functional Limitation: Mobility: Walking and moving around Mobility: Walking and Moving Around Current Status [id number]): At least 60 percent but less than 80 percent impaired, limited or restricted Mobility: Walking and Moving Around Goal Status [phone]): At least 40 percent but less than 60 percent  impaired, limited or restricted  RUSSELL,CINDY 03/15/2014, 5:11 PM  Physician Documentation Your signature is required to indicate approval of the treatment plan as stated above.  Please sign and either send electronically or make a copy of this report for your files and return this physician signed original.   Please mark one 1.__approve of plan  2. ___approve of plan with the following conditions.   ______________________________                                                          _____________________ Physician Signature                                                                                                             Date

## 2014-03-23 ENCOUNTER — Ambulatory Visit (HOSPITAL_COMMUNITY)
Admission: RE | Admit: 2014-03-23 | Discharge: 2014-03-23 | Disposition: A | Discharge status: Home / Self Care | Payer: Medicare Other | Source: Ambulatory Visit | Attending: Pulmonary Disease | Admitting: Pulmonary Disease

## 2014-03-23 DIAGNOSIS — R29898 Other symptoms and signs involving the musculoskeletal system: Secondary | ICD-10-CM

## 2014-03-23 DIAGNOSIS — IMO0001 Reserved for inherently not codable concepts without codable children: Secondary | ICD-10-CM | POA: Diagnosis not present

## 2014-03-23 DIAGNOSIS — M545 Low back pain, unspecified: Secondary | ICD-10-CM | POA: Diagnosis not present

## 2014-03-23 DIAGNOSIS — R262 Difficulty in walking, not elsewhere classified: Secondary | ICD-10-CM | POA: Diagnosis not present

## 2014-03-23 DIAGNOSIS — R2689 Other abnormalities of gait and mobility: Secondary | ICD-10-CM

## 2014-03-23 DIAGNOSIS — I10 Essential (primary) hypertension: Secondary | ICD-10-CM | POA: Diagnosis not present

## 2014-03-23 NOTE — Progress Notes (Signed)
Physical Therapy Treatment Patient Details  Name: [name] MRN: [id number] Date of Birth: 07/23/1941  Today's Date: 03/23/2014 Time: 1300-1344 PT Time Calculation (min): 44 min  Visit#: 32 of 43  Re-eval: 04/13/14    Authorization: Medicare   Authorization Visit#: 32 of 41   Subjective: Symptoms/Limitations Symptoms: Pt states that he went back to the gym and is sore in his mm Pain Assessment Currently in Pain?: No/denies Exercise/Treatments  Balance Exercises Standing Sidestepping: 2 reps Marching: Solid surface;Hand held assist (HHA) 2;10 reps Sit to Stand: Standard surface Sit to Stand Limitations: Sit to stand x 10 standing as long as possible with 1 HHA Other Standing Exercises: standing x 3  Other Standing Exercises: B hip abduction   Other Supine Exercises: pilates 100 with 2# wt; punch out with 2# weight.  Other Supine Exercises: prone rows, shld ext 2# x 10@; glut set/ heel squeezed together x 10      Physical Therapy Assessment and Plan PT Assessment and Plan Clinical Impression Statement: Pt has had decreased endurance needing mulltiple rest breaks throughout treatement.  Pt began core strengtheing as his balance has decreased significantly.  PT Plan: Go over star answers, stress the importance of increasing steps every weekl.  Work on Animator.       Problem List Patient Active Problem List   Diagnosis Date Noted  . Muscle weakness (generalized) 12/05/2013  . Tight fascia 12/05/2013  . Decreased range of motion of right shoulder 12/05/2013  . Decreased range of motion of left shoulder 12/05/2013  . Bilateral leg weakness 11/21/2013  . Poor balance 11/21/2013  . Difficulty in walking(719.7) 11/21/2013  . Shoulder pain, left 09/12/2013  . Anemia, iron deficiency 09/09/2013  . Dyspnea 09/07/2013  . UTI (urinary tract infection) 09/07/2013  . Acute CHF (congestive heart failure) 09/07/2013  . Elevated troponin 09/07/2013  .  Fever and chills 09/06/2013  . Altered mental status 09/05/2013  . Neurogenic bowel 08/22/2013  . Cauda equina syndrome with neurogenic bladder 08/22/2013  . Gout flare 08/22/2013  . Postoperative wound infection 08/22/2013  . Lumbar degenerative disc disease 08/02/2013  . Post-operative pain 07/29/2013  . Osteonecrosis 05/05/2013  . Small bowel obstruction 04/17/2013  . Acute renal failure 04/17/2013  . Medial meniscus, posterior horn derangement 02/17/2013  . Knee sprain and strain 02/17/2013  . Trigger point with neck pain 03/18/2012  . Rotator cuff tear arthropathy of right shoulder 09/08/2011  . CAD in native artery 01/09/2011  . Ankle fracture 12/25/2010  . Contusion of shoulder, left 12/25/2010  . PEMPHIGUS VULGARIS 07/02/2010  . MRSA 05/13/2010  . PRURITUS 05/13/2010  . SPINAL STENOSIS, LUMBAR 05/13/2010  . HYPERLIPIDEMIA 04/24/2010  . GASTROESOPHAGEAL REFLUX DISEASE 04/24/2010  . VENTRAL HERNIA, INCISIONAL 04/24/2010  . DEGENERATIVE JOINT DISEASE 04/24/2010  . PYOGENIC ARTHRITIS, SHOULDER REGION 02/04/2010  . HYPERTENSION 01/01/2010  . RUPTURE ROTATOR CUFF 02/19/2009       GP    RUSSELL,CINDY 03/23/2014, 4:44 PM

## 2014-03-28 ENCOUNTER — Ambulatory Visit (HOSPITAL_COMMUNITY)
Admission: RE | Admit: 2014-03-28 | Discharge: 2014-03-28 | Disposition: A | Discharge status: Home / Self Care | Payer: Medicare Other | Source: Ambulatory Visit | Attending: Pulmonary Disease | Admitting: Pulmonary Disease

## 2014-03-28 DIAGNOSIS — R262 Difficulty in walking, not elsewhere classified: Secondary | ICD-10-CM | POA: Insufficient documentation

## 2014-03-28 DIAGNOSIS — R2689 Other abnormalities of gait and mobility: Secondary | ICD-10-CM

## 2014-03-28 DIAGNOSIS — M545 Low back pain, unspecified: Secondary | ICD-10-CM | POA: Diagnosis not present

## 2014-03-28 DIAGNOSIS — I10 Essential (primary) hypertension: Secondary | ICD-10-CM | POA: Insufficient documentation

## 2014-03-28 DIAGNOSIS — G834 Cauda equina syndrome: Secondary | ICD-10-CM | POA: Diagnosis not present

## 2014-03-28 DIAGNOSIS — R29898 Other symptoms and signs involving the musculoskeletal system: Secondary | ICD-10-CM | POA: Insufficient documentation

## 2014-03-28 DIAGNOSIS — K5900 Constipation, unspecified: Secondary | ICD-10-CM | POA: Diagnosis not present

## 2014-03-28 DIAGNOSIS — I259 Chronic ischemic heart disease, unspecified: Secondary | ICD-10-CM | POA: Diagnosis not present

## 2014-03-28 DIAGNOSIS — IMO0001 Reserved for inherently not codable concepts without codable children: Secondary | ICD-10-CM | POA: Insufficient documentation

## 2014-03-28 DIAGNOSIS — R21 Rash and other nonspecific skin eruption: Secondary | ICD-10-CM | POA: Diagnosis not present

## 2014-03-28 NOTE — Progress Notes (Signed)
Physical Therapy Treatment Patient Details  Name: [name] MRN: [id number] Date of Birth: [birth year]  Today's Date: 03/28/2014 Time: 4098-1191 PT Time Calculation (min): 42 min Charge: TE 1348-1415, NMR 1415-1430   Visit#: 33 of 43  Re-eval: 04/13/14 Assessment Diagnosis: HNP Next MD Visit: Dr Juanetta Gosling  Authorization: Medicare  Authorization Time Period:    Authorization Visit#: 33 of 41   Subjective: Symptoms/Limitations Symptoms: Pt reported he has been more walking, bought pedometer but will not work.  Went to game on Friday and stood half or more of the game with back proped up but weight on feet. Pain Assessment Currently in Pain?: No/denies  Precautions/Restrictions  Precautions Precautions: Back  Exercise/Treatments Standing Functional Squat: 20 reps;Other (comment) Supine Bridges: 20 reps;Limitations Bridges Limitations: with blue tband around knees for abduction during bridge Sidelying Hip ABduction: AROM;Right;AAROM;Left;10 reps Clams: AROM Rt, AAROM Lt 2x 10 reps  Balance Exercises Standing Marching: Solid surface;Hand held assist (HHA) 2;10 reps Sit to Stand: Standard surface Sit to Stand Limitations: Sit to stand x 10 standing as long as possible with 1 HHA Other Standing Exercises: standing x 10 max amount 32" no HHA   Physical Therapy Assessment and Plan PT Assessment and Plan Clinical Impression Statement: Pt with limited activity tolerance requiring multple rest breaks during standing activities due to weakness.  Improved stabilty wtih ability to stand for 32" with no HHA with min to mod assistance to reduce risk of falls.  Continued with gluteal strengthenig mat activities to improve strength with standing.  Pt reported he has been spending more time on his feet standing. PT Plan: Go over star answers, stress the importance of increasing steps every weekl.  Work on Animator.    Goals PT Short Term Goals PT Short Term Goal  1: Pt to increase Berg balace by 5 to decrease risk of falling PT Short Term Goal 2: Pt to be walking for 10 mintues with rolling walker PT Short Term Goal 2 - Progress: Progressing toward goal PT Short Term Goal 3: Pt to be able to stand for a minute with no assistive device to allow easier grooming  PT Short Term Goal 3 - Progress: Progressing toward goal PT Long Term Goals PT Long Term Goal 1: Berg balance to be increased by 10 to allow decreased risk of falling PT Long Term Goal 2: Pt to be ambulating with walker x 30 minutes Long Term Goal 3: Pt to be able to stand without assistive device for five minutes to ease grooming Long Term Goal 4: Pt to be able to stand with walker for 15 minutes for socializing . PT Long Term Goal 5: strength of LE improved one grade to allow the above to occur  Problem List Patient Active Problem List   Diagnosis Date Noted  . Muscle weakness (generalized) 12/05/2013  . Tight fascia 12/05/2013  . Decreased range of motion of right shoulder 12/05/2013  . Decreased range of motion of left shoulder 12/05/2013  . Bilateral leg weakness 11/21/2013  . Poor balance 11/21/2013  . Difficulty in walking(719.7) 11/21/2013  . Shoulder pain, left 09/12/2013  . Anemia, iron deficiency 09/09/2013  . Dyspnea 09/07/2013  . UTI (urinary tract infection) 09/07/2013  . Acute CHF (congestive heart failure) 09/07/2013  . Elevated troponin 09/07/2013  . Fever and chills 09/06/2013  . Altered mental status 09/05/2013  . Neurogenic bowel 08/22/2013  . Cauda equina syndrome with neurogenic bladder 08/22/2013  . Gout flare 08/22/2013  . Postoperative wound infection  08/22/2013  . Lumbar degenerative disc disease 08/02/2013  . Post-operative pain 07/29/2013  . Osteonecrosis 05/05/2013  . Small bowel obstruction 04/17/2013  . Acute renal failure 04/17/2013  . Medial meniscus, posterior horn derangement 02/17/2013  . Knee sprain and strain 02/17/2013  . Trigger point with  neck pain 03/18/2012  . Rotator cuff tear arthropathy of right shoulder 09/08/2011  . CAD in native artery 01/09/2011  . Ankle fracture 12/25/2010  . Contusion of shoulder, left 12/25/2010  . PEMPHIGUS VULGARIS 07/02/2010  . MRSA 05/13/2010  . PRURITUS 05/13/2010  . SPINAL STENOSIS, LUMBAR 05/13/2010  . HYPERLIPIDEMIA 04/24/2010  . GASTROESOPHAGEAL REFLUX DISEASE 04/24/2010  . VENTRAL HERNIA, INCISIONAL 04/24/2010  . DEGENERATIVE JOINT DISEASE 04/24/2010  . PYOGENIC ARTHRITIS, SHOULDER REGION 02/04/2010  . HYPERTENSION 01/01/2010  . RUPTURE ROTATOR CUFF 02/19/2009    PT - End of Session Equipment Utilized During Treatment: Gait belt Activity Tolerance: Patient limited by fatigue;Patient tolerated treatment well General Behavior During Therapy: [facility] for tasks assessed/performed  GP    Juel Burrow 03/28/2014, 2:45 PM

## 2014-03-29 ENCOUNTER — Ambulatory Visit (HOSPITAL_COMMUNITY): Payer: Medicare Other | Admitting: Physical Therapy

## 2014-03-31 ENCOUNTER — Ambulatory Visit (HOSPITAL_COMMUNITY): Payer: Medicare Other | Admitting: Physical Therapy

## 2014-04-04 ENCOUNTER — Ambulatory Visit (HOSPITAL_COMMUNITY): Admission: RE | Admit: 2014-04-04 | Payer: Medicare Other | Source: Ambulatory Visit | Admitting: Physical Therapy

## 2014-04-05 ENCOUNTER — Ambulatory Visit (HOSPITAL_COMMUNITY): Payer: Medicare Other | Admitting: Physical Therapy

## 2014-04-06 DIAGNOSIS — M549 Dorsalgia, unspecified: Secondary | ICD-10-CM | POA: Diagnosis not present

## 2014-04-06 DIAGNOSIS — M6281 Muscle weakness (generalized): Secondary | ICD-10-CM | POA: Diagnosis not present

## 2014-04-06 DIAGNOSIS — R262 Difficulty in walking, not elsewhere classified: Secondary | ICD-10-CM | POA: Diagnosis not present

## 2014-04-07 ENCOUNTER — Ambulatory Visit (HOSPITAL_COMMUNITY): Admission: RE | Admit: 2014-04-07 | Payer: Medicare Other | Source: Ambulatory Visit

## 2014-04-17 ENCOUNTER — Encounter: Payer: Self-pay | Admitting: Cardiovascular Disease

## 2014-04-17 ENCOUNTER — Ambulatory Visit (INDEPENDENT_AMBULATORY_CARE_PROVIDER_SITE_OTHER): Payer: Medicare Other | Admitting: Cardiovascular Disease

## 2014-04-17 VITALS — BP 128/72 | HR 86 | Ht 72.0 in | Wt 181.0 lb

## 2014-04-17 DIAGNOSIS — I25810 Atherosclerosis of coronary artery bypass graft(s) without angina pectoris: Secondary | ICD-10-CM

## 2014-04-17 DIAGNOSIS — E785 Hyperlipidemia, unspecified: Secondary | ICD-10-CM | POA: Diagnosis not present

## 2014-04-17 DIAGNOSIS — I509 Heart failure, unspecified: Secondary | ICD-10-CM

## 2014-04-17 DIAGNOSIS — I10 Essential (primary) hypertension: Secondary | ICD-10-CM | POA: Diagnosis not present

## 2014-04-17 DIAGNOSIS — I5042 Chronic combined systolic (congestive) and diastolic (congestive) heart failure: Secondary | ICD-10-CM

## 2014-04-17 DIAGNOSIS — E782 Mixed hyperlipidemia: Secondary | ICD-10-CM

## 2014-04-17 MED ORDER — ATORVASTATIN CALCIUM 40 MG PO TABS: 40.0000 mg | ORAL_TABLET | Freq: Every day | ORAL | Status: DC

## 2014-04-17 NOTE — Progress Notes (Signed)
Patient ID: [name], male   DOB: [birth year], 73 y.o.   MRN: [id number]      SUBJECTIVE: The patient presents for followup for coronary artery disease and CABG as well as chronic systolic and diastolic heart failure. Had been on Lipitor, ASA, and Lasix earlier this year, now only on Coreg. He apparently decided to stop taking them when he got out of the hospital. He said aspirin makes him cold. He denies chest pain, palpitations, shortness of breath and leg swelling. He walks with a walker.   Review of Systems: As per "subjective", otherwise negative.  Allergies  Allergen Reactions  . Morphine Other (See Comments)    REACTION: made him go crazy; "I had fun w/it; my family didn't think it was too funny"  . Metoprolol Other (See Comments)    REACTION:  Tachycardia; "don't remember how bad it was; it was so long ago"  . Rifampin     Itch     Current Outpatient Prescriptions  Medication Sig Dispense Refill  . acetaminophen (TYLENOL) 325 MG tablet Take 1-2 tablets (325-650 mg total) by mouth every 4 (four) hours as needed for mild pain.      . carvedilol (COREG) 3.125 MG tablet Take 3.125 mg by mouth daily.      Marland Kitchen doxazosin (CARDURA) 8 MG tablet Take 8 mg by mouth daily.      . nitroGLYCERIN (NITROSTAT) 0.4 MG SL tablet Place 0.4 mg under the tongue every 5 (five) minutes as needed for chest pain. May take up to 3 doses per episode. Contact MD or 911 if unresolved chest pain continues.      . predniSONE (DELTASONE) 5 MG tablet Take 5 mg by mouth daily with breakfast.      . tamsulosin (FLOMAX) 0.4 MG CAPS capsule Take 1 capsule (0.4 mg total) by mouth daily after breakfast.  30 capsule  1  . vitamin C (ASCORBIC ACID) 500 MG tablet Take 500 mg by mouth daily.      Marland Kitchen VITAMIN D, CHOLECALCIFEROL, PO Take 1,000 Units by mouth 2 (two) times daily. For vitamin D deficiency       No current facility-administered medications for this visit.    Past Medical History  Diagnosis Date  .  Hyperlipidemia   . Small bowel problem     HAD ALOT OF SCAR TISSUE FROM PREVIOUS SURGERIES..NG WAS INSERTED ...Marland KitchenMarland KitchenNO SURGERY NEEDED...Marland KitchenMarland KitchenIN FOR 8 DAYS  . Disorder of blood     BEEN TREATED BY DERMATOLOGIST X 4 YRS..."BLOOD BLISTERS"  . Arthritis   . Gout   . Anxiety   . Hx MRSA infection     rt shoulder  . Pneumonia     "I've had it 3-4 times"  . Coronary artery disease     IN 2000   STENT PLACED IN 2012 sees Dr. Dietrich Pates, saw last 2013  . HTN (hypertension)     sees [name] in [place]  . Small bowel obstruction   . Ventral hernia   . Acute renal failure 04/17/2013  . 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 Anemia, iron deficiency 09/09/2013    Past Surgical History  Procedure Laterality Date  . Sternal surg  2000    HAS HAD 5-6 ON HIS STERNUM; "caught MRSA in it"  . Lumbar laminectomy/decompression microdiscectomy  06/13/2011    Procedure: LUMBAR LAMINECTOMY/DECOMPRESSION MICRODISCECTOMY;  Surgeon: Karn Cassis;  Location: [facility];  Service: Neurosurgery;  Laterality: N/A;  Lumbar  three, lumbar four-five Laminectomy  . Eye surgery      bilateral cataract  . Anterior cervical corpectomy  12/17/11  . Incision and drainage of wound  ~ 20ll; 12/03/11    "had infection in my right"  . Shoulder open rotator cuff repair  ~ 2011    right  . Peripherally inserted central catheter insertion  2011 & 11/2011  . Cataract extraction w/ intraocular lens  implant, bilateral  ? 2011  . Coronary artery bypass graft  2000    CABG X5  . Back surgery      lumbar  . Tonsillectomy  1949  . Cholecystectomy  2006 "or after"  . Coronary angioplasty with stent placement  2012  . Anterior cervical corpectomy  12/17/2011    Procedure: ANTERIOR CERVICAL CORPECTOMY;  Surgeon: Karn Cassis, MD;  Location: [facility];  Service: Neurosurgery;  Laterality: N/A;  Anterior Cervical Decompression Fusion Five to Thoracic Two with plating  . Lumbar laminectomy/decompression  microdiscectomy Right 06/17/2013    Procedure: LUMBAR LAMINECTOMY/DECOMPRESSION MICRODISCECTOMY 1 LEVEL;  Surgeon: Karn Cassis, MD;  Location: [facility];  Service: Neurosurgery;  Laterality: Right;  Right L3-4 Microdiskectomy  . Lumbar wound debridement N/A 08/02/2013    Procedure: INCISION AND DRAINAGE OF LUMBAR WOUND DEBRIDEMENT;  Surgeon: Karn Cassis, MD;  Location: [facility];  Service: Neurosurgery;  Laterality: N/A;  . Shoulder arthroscopy Left 09/13/2013    Procedure: ARTHROSCOPIC IRRIGATION AND DEBRIDEMENT, SYNOVECTOMY ,  LEFT SHOULDER ;  Surgeon: Thera Flake., MD;  Location: [facility];  Service: Orthopedics;  Laterality: Left;    History   Social History  . Marital Status: Married    Spouse Name: N/A    Number of Children: N/A  . Years of Education: N/A   Occupational History  . Not on file.   Social History Main Topics  . Smoking status: Former Smoker -- 1.00 packs/day for 1 years    Types: Cigarettes    Quit date: 12/27/1998  . Smokeless tobacco: Never Used  . Alcohol Use: No  . Drug Use: No  . Sexual Activity: Not Currently   Other Topics Concern  . Not on file   Social History Narrative  . No narrative on file     Filed Vitals:   04/17/14 1253  BP: 128/72  Pulse: 86  Height: 6' (1.829 m)  Weight: 181 lb (82.101 kg)  SpO2: 97%    PHYSICAL EXAM General: NAD HEENT: Normal. Neck: No JVD, no thyromegaly. Lungs: Clear to auscultation bilaterally with normal respiratory effort. CV: Nondisplaced PMI.  Regular rate and rhythm, normal S1/S2, no S3/S4, no murmur. No pretibial or periankle edema.   Abdomen: Soft, nontender, no distention.  Neurologic: Alert and oriented x 3.  Psych: Normal affect. Skin: Normal. Musculoskeletal: Normal range of motion, no gross deformities. Extremities: No clubbing or cyanosis.   ECG: Most recent ECG reviewed.      ASSESSMENT AND PLAN: 1. CAD/CABG: Stable ischemic heart disease. Will reinitiate ASA and  Lipitor 40 mg. Continue Coreg. 2. Chronic systolic and diastolic heart failure: No evidence of volume overload, and not on diuretics any longer. EF 45% earlier this year. 3. Essential HTN: Controlled on Coreg. 4. Hyperlipidemia: Restart Lipitor and check lipids/LFT's in 3 months.  Dispo: f/u 6 months.  Prentice Docker, M.D., F.A.C.C.

## 2014-04-17 NOTE — Patient Instructions (Signed)
Your physician wants you to follow-up in: 6 months with Dr. Reggy Eye will receive a reminder letter in the mail two months in advance. If you don't receive a letter, please call our office to schedule the follow-up appointment.  Your physician has recommended you make the following change in your medication:   START ASPIRIN 81 MG DAILY  START LIPITOR 40 MG DAILY  Your physician recommends that you return for lab work in: 3 months Lipids/LFT  Thank you for choosing [facility]!!

## 2014-04-26 ENCOUNTER — Encounter: Payer: Self-pay | Admitting: Orthopedic Surgery

## 2014-04-26 ENCOUNTER — Ambulatory Visit (INDEPENDENT_AMBULATORY_CARE_PROVIDER_SITE_OTHER): Payer: Medicare Other | Admitting: Orthopedic Surgery

## 2014-04-26 ENCOUNTER — Ambulatory Visit (INDEPENDENT_AMBULATORY_CARE_PROVIDER_SITE_OTHER): Payer: Medicare Other

## 2014-04-26 VITALS — BP 148/86 | Ht 72.0 in | Wt 181.0 lb

## 2014-04-26 DIAGNOSIS — M25569 Pain in unspecified knee: Secondary | ICD-10-CM

## 2014-04-26 DIAGNOSIS — M25562 Pain in left knee: Secondary | ICD-10-CM

## 2014-04-26 DIAGNOSIS — I25810 Atherosclerosis of coronary artery bypass graft(s) without angina pectoris: Secondary | ICD-10-CM | POA: Diagnosis not present

## 2014-04-26 NOTE — Progress Notes (Signed)
Chief Complaint  Patient presents with  . Knee Injury    left knee pain x 3 months, twisting injury... ref. j dabbs   Knee Pain Patient complains of left knee pain. This is evaluated as a personal injury. 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 He thinks he may have twisted his knee getting up. He complains of sharp deep pain in the left knee worse at night worse after activity and rates it 7/10 denies locking giving out swelling  The patient's past history, family history and social history have been reviewed. Review of systems as stated in the history also has some bladder issues related to the lumbar surgery denies shortness of breath or chest pain denies fever  VS BP 148/86  Ht 6' (1.829 m)  Wt 181 lb (82.101 kg)  BMI 24.54 kg/m2   APPEARANCE normal   ORIENTATION normal   MOOD normal   GAIT AFO with walker and    left Knee   Swelling: None  Effusion: Negative  Tenderness: Medial joint line and Lateral joint line     Range of Motion:     Extension: Normal    Flexion: Normal     McMurrays: Negative  Anterior Lachmann: Negative  Posterior Lachmann: Negative  Anterior Drawer: Negative  Posterior Drawer: Negative  Varus Stress Test: Negative  Valgus Stress Test: Negative  Pivot Shift: Negative  Patellar Apprehension: No  Pulses: Normal Sensation mild L5 sensory loss DTR diminished  LOWER EXTREMITY LYMPH NODES negative  Balance are to assess but seems normal even with walker  The opposite knee: Normal  IMAGING osteopenia in the femur mild degenerative changes nothing acute  DX: Osteoarthritis  PLAN: Injection  Procedure note left knee injection verbal consent was obtained to inject left knee joint  Timeout was completed to confirm the site of injection  The medications used were 40 mg of Depo-Medrol and 1% lidocaine 3 cc  Anesthesia was provided by ethyl chloride and  the skin was prepped with alcohol.  After cleaning the skin with alcohol a 20-gauge needle was used to inject the left knee joint. There were no complications. A sterile bandage was applied.

## 2014-04-27 ENCOUNTER — Ambulatory Visit: Payer: Medicare Other | Admitting: Orthopedic Surgery

## 2014-05-03 DIAGNOSIS — Z981 Arthrodesis status: Secondary | ICD-10-CM | POA: Diagnosis not present

## 2014-05-03 DIAGNOSIS — M21372 Foot drop, left foot: Secondary | ICD-10-CM | POA: Diagnosis not present

## 2014-05-03 DIAGNOSIS — M545 Low back pain: Secondary | ICD-10-CM | POA: Diagnosis not present

## 2014-05-03 DIAGNOSIS — M4319 Spondylolisthesis, multiple sites in spine: Secondary | ICD-10-CM | POA: Diagnosis not present

## 2014-05-31 DIAGNOSIS — M21372 Foot drop, left foot: Secondary | ICD-10-CM | POA: Diagnosis not present

## 2014-05-31 DIAGNOSIS — M4806 Spinal stenosis, lumbar region: Secondary | ICD-10-CM | POA: Diagnosis not present

## 2014-05-31 DIAGNOSIS — M4327 Fusion of spine, lumbosacral region: Secondary | ICD-10-CM | POA: Diagnosis not present

## 2014-05-31 DIAGNOSIS — G959 Disease of spinal cord, unspecified: Secondary | ICD-10-CM | POA: Diagnosis not present

## 2014-05-31 DIAGNOSIS — M5126 Other intervertebral disc displacement, lumbar region: Secondary | ICD-10-CM | POA: Diagnosis not present

## 2014-05-31 DIAGNOSIS — Z981 Arthrodesis status: Secondary | ICD-10-CM | POA: Diagnosis not present

## 2014-05-31 DIAGNOSIS — R32 Unspecified urinary incontinence: Secondary | ICD-10-CM | POA: Diagnosis not present

## 2014-05-31 DIAGNOSIS — M5136 Other intervertebral disc degeneration, lumbar region: Secondary | ICD-10-CM | POA: Diagnosis not present

## 2014-05-31 DIAGNOSIS — R531 Weakness: Secondary | ICD-10-CM | POA: Diagnosis not present

## 2014-05-31 DIAGNOSIS — R269 Unspecified abnormalities of gait and mobility: Secondary | ICD-10-CM | POA: Diagnosis not present

## 2014-06-06 ENCOUNTER — Other Ambulatory Visit: Payer: Self-pay | Admitting: Urology

## 2014-06-06 ENCOUNTER — Ambulatory Visit (INDEPENDENT_AMBULATORY_CARE_PROVIDER_SITE_OTHER): Payer: Medicare Other | Admitting: Urology

## 2014-06-06 DIAGNOSIS — R339 Retention of urine, unspecified: Secondary | ICD-10-CM

## 2014-06-06 DIAGNOSIS — N319 Neuromuscular dysfunction of bladder, unspecified: Secondary | ICD-10-CM

## 2014-06-06 DIAGNOSIS — Z23 Encounter for immunization: Secondary | ICD-10-CM | POA: Diagnosis not present

## 2014-07-06 ENCOUNTER — Telehealth: Payer: Self-pay | Admitting: *Deleted

## 2014-07-06 MED ORDER — CARVEDILOL 6.25 MG PO TABS: 6.2500 mg | ORAL_TABLET | Freq: Two times a day (BID) | ORAL | Status: DC

## 2014-07-06 NOTE — Telephone Encounter (Signed)
Per Dr. Purvis SheffieldKoneswaran, increased coreg to 6.25 mg twice daily. Pt made aware

## 2014-07-07 ENCOUNTER — Encounter: Payer: Self-pay | Admitting: Cardiovascular Disease

## 2014-07-14 ENCOUNTER — Other Ambulatory Visit (HOSPITAL_COMMUNITY): Payer: Self-pay | Admitting: Orthopedic Surgery

## 2014-07-14 DIAGNOSIS — G959 Disease of spinal cord, unspecified: Secondary | ICD-10-CM

## 2014-07-25 ENCOUNTER — Ambulatory Visit (HOSPITAL_COMMUNITY)
Admission: RE | Admit: 2014-07-25 | Discharge: 2014-07-25 | Disposition: A | Discharge status: Home / Self Care | Payer: Medicare Other | Source: Ambulatory Visit | Attending: Orthopedic Surgery | Admitting: Orthopedic Surgery

## 2014-07-25 DIAGNOSIS — Z981 Arthrodesis status: Secondary | ICD-10-CM | POA: Insufficient documentation

## 2014-07-25 DIAGNOSIS — M5124 Other intervertebral disc displacement, thoracic region: Secondary | ICD-10-CM | POA: Diagnosis not present

## 2014-07-25 DIAGNOSIS — M549 Dorsalgia, unspecified: Secondary | ICD-10-CM | POA: Insufficient documentation

## 2014-07-25 DIAGNOSIS — M5031 Other cervical disc degeneration,  high cervical region: Secondary | ICD-10-CM | POA: Insufficient documentation

## 2014-07-25 DIAGNOSIS — M4602 Spinal enthesopathy, cervical region: Secondary | ICD-10-CM | POA: Diagnosis not present

## 2014-07-25 DIAGNOSIS — M47814 Spondylosis without myelopathy or radiculopathy, thoracic region: Secondary | ICD-10-CM | POA: Diagnosis not present

## 2014-07-25 DIAGNOSIS — M47812 Spondylosis without myelopathy or radiculopathy, cervical region: Secondary | ICD-10-CM | POA: Diagnosis not present

## 2014-07-25 DIAGNOSIS — R262 Difficulty in walking, not elsewhere classified: Secondary | ICD-10-CM | POA: Insufficient documentation

## 2014-07-25 DIAGNOSIS — M542 Cervicalgia: Secondary | ICD-10-CM | POA: Insufficient documentation

## 2014-07-25 DIAGNOSIS — M5021 Other cervical disc displacement,  high cervical region: Secondary | ICD-10-CM | POA: Insufficient documentation

## 2014-07-25 DIAGNOSIS — M5032 Other cervical disc degeneration, mid-cervical region: Secondary | ICD-10-CM | POA: Diagnosis not present

## 2014-07-25 DIAGNOSIS — G959 Disease of spinal cord, unspecified: Secondary | ICD-10-CM

## 2014-07-31 ENCOUNTER — Ambulatory Visit (HOSPITAL_COMMUNITY): Admission: RE | Admit: 2014-07-31 | Payer: Medicare Other | Source: Ambulatory Visit

## 2014-08-07 DIAGNOSIS — N390 Urinary tract infection, site not specified: Secondary | ICD-10-CM | POA: Diagnosis not present

## 2014-09-06 DIAGNOSIS — M4310 Spondylolisthesis, site unspecified: Secondary | ICD-10-CM | POA: Diagnosis not present

## 2014-09-06 DIAGNOSIS — M4806 Spinal stenosis, lumbar region: Secondary | ICD-10-CM | POA: Diagnosis not present

## 2014-09-06 DIAGNOSIS — M5135 Other intervertebral disc degeneration, thoracolumbar region: Secondary | ICD-10-CM | POA: Diagnosis not present

## 2014-09-06 DIAGNOSIS — Z01818 Encounter for other preprocedural examination: Secondary | ICD-10-CM | POA: Diagnosis not present

## 2014-09-28 DIAGNOSIS — N390 Urinary tract infection, site not specified: Secondary | ICD-10-CM | POA: Diagnosis not present

## 2014-10-05 DIAGNOSIS — N390 Urinary tract infection, site not specified: Secondary | ICD-10-CM | POA: Diagnosis not present

## 2014-10-05 DIAGNOSIS — G834 Cauda equina syndrome: Secondary | ICD-10-CM | POA: Diagnosis not present

## 2014-10-09 DIAGNOSIS — J209 Acute bronchitis, unspecified: Secondary | ICD-10-CM | POA: Diagnosis not present

## 2014-10-09 DIAGNOSIS — M545 Low back pain: Secondary | ICD-10-CM | POA: Diagnosis not present

## 2014-10-11 ENCOUNTER — Ambulatory Visit (INDEPENDENT_AMBULATORY_CARE_PROVIDER_SITE_OTHER): Payer: Medicare Other | Admitting: Cardiovascular Disease

## 2014-10-11 ENCOUNTER — Encounter: Payer: Self-pay | Admitting: Cardiovascular Disease

## 2014-10-11 VITALS — BP 108/78 | HR 66 | Ht 72.0 in | Wt 175.0 lb

## 2014-10-11 DIAGNOSIS — E782 Mixed hyperlipidemia: Secondary | ICD-10-CM

## 2014-10-11 DIAGNOSIS — I25810 Atherosclerosis of coronary artery bypass graft(s) without angina pectoris: Secondary | ICD-10-CM | POA: Diagnosis not present

## 2014-10-11 DIAGNOSIS — Z01818 Encounter for other preprocedural examination: Secondary | ICD-10-CM

## 2014-10-11 DIAGNOSIS — I5042 Chronic combined systolic (congestive) and diastolic (congestive) heart failure: Secondary | ICD-10-CM

## 2014-10-11 DIAGNOSIS — I10 Essential (primary) hypertension: Secondary | ICD-10-CM

## 2014-10-11 NOTE — Progress Notes (Signed)
Patient ID: [name], male   DOB: 12/10/1940, 74 y.o.   MRN: [id number]      SUBJECTIVE: The patient presents for followup for coronary artery disease and CABG as well as chronic systolic and diastolic heart failure.  He denies chest pain, palpitations, shortness of breath and leg swelling. He walks with a walker. [facility] and L2-S1 decompression and fusion with removal of prior instrumentation is being planned. EF 45% in 08/2013. He denies bleeding problems with ASA, but has been holding it in anticipation of surgery.  ECG performed in the office today demonstrates sinus rhythm with frequent PVCs, heart rate 72 bpm.  He had a normal nuclear stress test in March 2014.    Review of Systems: As per "subjective", otherwise negative.  Allergies  Allergen Reactions  . Morphine Other (See Comments)    REACTION: made him go crazy; "I had fun w/it; my family didn't think it was too funny"  . Metoprolol Other (See Comments)    REACTION:  Tachycardia; "don't remember how bad it was; it was so long ago"  . Rifampin     Itch     Current Outpatient Prescriptions  Medication Sig Dispense Refill  . acetaminophen (TYLENOL) 325 MG tablet Take 1-2 tablets (325-650 mg total) by mouth every 4 (four) hours as needed for mild pain.    Marland Kitchen. atorvastatin (LIPITOR) 40 MG tablet Take 1 tablet (40 mg total) by mouth daily. 30 tablet 6  . carvedilol (COREG) 6.25 MG tablet Take 1 tablet (6.25 mg total) by mouth 2 (two) times daily. 180 tablet 3  . docusate sodium (COLACE) 100 MG capsule Take 100 mg by mouth daily.    Marland Kitchen. doxazosin (CARDURA) 8 MG tablet Take 8 mg by mouth daily.    . nitroGLYCERIN (NITROSTAT) 0.4 MG SL tablet Place 0.4 mg under the tongue every 5 (five) minutes as needed for chest pain. May take up to 3 doses per episode. Contact MD or 911 if unresolved chest pain continues.    Marland Kitchen. oxyCODONE-acetaminophen (PERCOCET) 10-325 MG per tablet Take 1 tablet by mouth  every 4 (four) hours as needed for pain.    . predniSONE (DELTASONE) 5 MG tablet Take 5 mg by mouth daily with breakfast.    . tamsulosin (FLOMAX) 0.4 MG CAPS capsule Take 1 capsule (0.4 mg total) by mouth daily after breakfast. 30 capsule 1  . vitamin C (ASCORBIC ACID) 500 MG tablet Take 500 mg by mouth daily.    Marland Kitchen. VITAMIN D, CHOLECALCIFEROL, PO Take 1,000 Units by mouth 2 (two) times daily. For vitamin D deficiency    . aspirin 81 MG tablet Take 81 mg by mouth daily.     No current facility-administered medications for this visit.    Past Medical History  Diagnosis Date  . Hyperlipidemia   . Small bowel problem     HAD ALOT OF SCAR TISSUE FROM PREVIOUS SURGERIES..NG WAS INSERTED ...Marland Kitchen.Marland Kitchen.NO SURGERY NEEDED...Marland Kitchen.Marland Kitchen.IN FOR 8 DAYS  . Disorder of blood     BEEN TREATED BY DERMATOLOGIST X 4 YRS..."BLOOD BLISTERS"  . Arthritis   . Gout   . Anxiety   . Hx MRSA infection     rt shoulder  . Pneumonia     "I've had it 3-4 times"  . Coronary artery disease     IN 2000   STENT PLACED IN 2012 sees Dr. Dietrich Patesothbart, saw last 2013  . HTN (hypertension)     sees Dr. Juanetta GoslingHawkins  in [place]  . Small bowel obstruction   . Ventral hernia   . Acute renal failure 04/17/2013  . 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 Anemia, iron deficiency 09/09/2013    Past Surgical History  Procedure Laterality Date  . Sternal surg  2000    HAS HAD 5-6 ON HIS STERNUM; "caught MRSA in it"  . Lumbar laminectomy/decompression microdiscectomy  06/13/2011    Procedure: LUMBAR LAMINECTOMY/DECOMPRESSION MICRODISCECTOMY;  Surgeon: Karn Cassis;  Location: [facility];  Service: Neurosurgery;  Laterality: N/A;  Lumbar three, lumbar four-five Laminectomy  . Eye surgery      bilateral cataract  . Anterior cervical corpectomy  12/17/11  . Incision and drainage of wound  ~ 20ll; 12/03/11    "had infection in my right"  . Shoulder open rotator cuff repair  ~ 2011    right  . Peripherally inserted central catheter  insertion  2011 & 11/2011  . Cataract extraction w/ intraocular lens  implant, bilateral  ? 2011  . Coronary artery bypass graft  2000    CABG X5  . Back surgery      lumbar  . Tonsillectomy  1949  . Cholecystectomy  2006 "or after"  . Coronary angioplasty with stent placement  2012  . Anterior cervical corpectomy  12/17/2011    Procedure: ANTERIOR CERVICAL CORPECTOMY;  Surgeon: Karn Cassis, MD;  Location: [facility];  Service: Neurosurgery;  Laterality: N/A;  Anterior Cervical Decompression Fusion Five to Thoracic Two with plating  . Lumbar laminectomy/decompression microdiscectomy Right 06/17/2013    Procedure: LUMBAR LAMINECTOMY/DECOMPRESSION MICRODISCECTOMY 1 LEVEL;  Surgeon: Karn Cassis, MD;  [facility]: [facility];  Service: Neurosurgery;  Laterality: Right;  Right L3-4 Microdiskectomy  . Lumbar wound debridement N/A 08/02/2013    Procedure: INCISION AND DRAINAGE OF LUMBAR WOUND DEBRIDEMENT;  Surgeon: Karn Cassis, MD;  [facility]: [facility];  Service: Neurosurgery;  Laterality: N/A;  . Shoulder arthroscopy Left 09/13/2013    Procedure: ARTHROSCOPIC IRRIGATION AND DEBRIDEMENT, SYNOVECTOMY ,  LEFT SHOULDER ;  Surgeon: Thera Flake., MD;  Location: [facility];  Service: Orthopedics;  Laterality: Left;    History   Social History  . Marital Status: Married    Spouse Name: N/A  . Number of Children: N/A  . Years of Education: N/A   Occupational History  . Not on file.   Social History Main Topics  . Smoking status: Former Smoker -- 1.00 packs/day for 1 years    Types: Cigarettes    Start date: 12/29/1959    Quit date: 12/27/1998  . Smokeless tobacco: Never Used  . Alcohol Use: No  . Drug Use: No  . Sexual Activity: Not Currently   Other Topics Concern  . Not on file   Social History Narrative     Filed Vitals:   10/11/14 1259  BP: 108/78  Pulse: 66  Height: 6' (1.829 m)  Weight: 175 lb (79.379 kg)  SpO2: 95%    PHYSICAL EXAM General: NAD HEENT:  Normal. Neck: No JVD, no thyromegaly. Lungs: Clear to auscultation bilaterally with normal respiratory effort. CV: Nondisplaced PMI.  Regular rate and rhythm with premature contractions appreciated, normal S1/S2, no S3/S4, no murmur. No pretibial or periankle edema.  Abdomen: Soft, nontender, no distention.  Neurologic: Alert and oriented x 3.  Psych: Normal affect. Skin: Normal. Musculoskeletal: No gross deformities. Extremities: No clubbing or cyanosis.   ECG: Most recent ECG reviewed.  ASSESSMENT AND PLAN: 1. CAD/CABG: Stable ischemic heart disease. Normal nuclear stress test 10/19/12. Continue ASA (after surgery), Lipitor, and Coreg. 2. Chronic systolic and diastolic heart failure: No evidence of volume overload, and not on diuretics any longer. EF 45% on 09/08/13. 3. Essential HTN: Controlled on Coreg. No changes. 4. Hyperlipidemia: Continue Lipitor and check lipids. 5. Preoperative risk stratification: Given his history of CAD and CABG with mildly reduced LV systolic function, he is at an intermediate risk for major adverse cardiac events in the perioperative period. He had a normal nuclear myocardial perfusion study in March 2014. There is no strong data to support that coronary revascularization prior to surgery would reduce hard events regardless. I recommend continuing carvedilol during the perioperative period and resuming aspirin when deemed feasible. No noninvasive testing is planned at this time.  Dispo: f/u 6 months.  Prentice Docker, M.D., F.A.C.C.

## 2014-10-11 NOTE — Patient Instructions (Signed)
Your physician wants you to follow-up in: 6 months with Koneswaran.  You will receive a reminder letter in the mail two months in advance. If you don't receive a letter, please call our office to schedule the follow-up appointment.  Your physician recommends that you return for lab work in: Fasting (Lipid)  Your physician recommends that you continue on your current medications as directed. Please refer to the Current Medication list given to you today.  Thank you for choosing [facility]!

## 2014-10-13 NOTE — Addendum Note (Signed)
Addended by: Marlyn CorporalARLTON, Kelvon Giannini A on: 10/13/2014 09:07 AM   Modules accepted: Orders

## 2014-10-18 DIAGNOSIS — M4806 Spinal stenosis, lumbar region: Secondary | ICD-10-CM | POA: Diagnosis not present

## 2014-10-19 DIAGNOSIS — Z981 Arthrodesis status: Secondary | ICD-10-CM | POA: Diagnosis not present

## 2014-10-19 DIAGNOSIS — R159 Full incontinence of feces: Secondary | ICD-10-CM | POA: Diagnosis present

## 2014-10-19 DIAGNOSIS — M4806 Spinal stenosis, lumbar region: Secondary | ICD-10-CM | POA: Diagnosis not present

## 2014-10-19 DIAGNOSIS — M4316 Spondylolisthesis, lumbar region: Secondary | ICD-10-CM | POA: Diagnosis not present

## 2014-10-19 DIAGNOSIS — R001 Bradycardia, unspecified: Secondary | ICD-10-CM | POA: Diagnosis not present

## 2014-10-19 DIAGNOSIS — R32 Unspecified urinary incontinence: Secondary | ICD-10-CM | POA: Diagnosis present

## 2014-10-19 DIAGNOSIS — Z4889 Encounter for other specified surgical aftercare: Secondary | ICD-10-CM | POA: Diagnosis not present

## 2014-10-19 DIAGNOSIS — R031 Nonspecific low blood-pressure reading: Secondary | ICD-10-CM | POA: Diagnosis not present

## 2014-10-19 DIAGNOSIS — M4726 Other spondylosis with radiculopathy, lumbar region: Secondary | ICD-10-CM | POA: Diagnosis present

## 2014-10-19 DIAGNOSIS — I519 Heart disease, unspecified: Secondary | ICD-10-CM | POA: Diagnosis present

## 2014-10-19 DIAGNOSIS — M109 Gout, unspecified: Secondary | ICD-10-CM | POA: Diagnosis present

## 2014-10-19 DIAGNOSIS — R262 Difficulty in walking, not elsewhere classified: Secondary | ICD-10-CM | POA: Diagnosis not present

## 2014-10-19 DIAGNOSIS — M5416 Radiculopathy, lumbar region: Secondary | ICD-10-CM | POA: Diagnosis not present

## 2014-11-01 DIAGNOSIS — I2510 Atherosclerotic heart disease of native coronary artery without angina pectoris: Secondary | ICD-10-CM | POA: Diagnosis not present

## 2014-11-01 DIAGNOSIS — M545 Low back pain: Secondary | ICD-10-CM | POA: Diagnosis not present

## 2014-11-01 DIAGNOSIS — G834 Cauda equina syndrome: Secondary | ICD-10-CM | POA: Diagnosis not present

## 2014-11-01 DIAGNOSIS — I10 Essential (primary) hypertension: Secondary | ICD-10-CM | POA: Diagnosis not present

## 2014-11-02 DIAGNOSIS — T148 Other injury of unspecified body region: Secondary | ICD-10-CM | POA: Diagnosis not present

## 2014-11-15 ENCOUNTER — Other Ambulatory Visit: Payer: Self-pay | Admitting: *Deleted

## 2014-11-15 NOTE — Patient Outreach (Signed)
[facility] Care Management  11/15/2014  Ignacia MarvelRonald W Dewolfe 10/29/1940 [id number]  I spoke with [name] by phone to follow up on [facility]. [name] says he is amazed at how well he is doing. He is no longer required to self cath and is voiding without difficulty. He has regained some lower extremity strength and is to follow up with his surgeon in 2 weeks when he might be able to begin physical therapy.   I scheduled a home visit with him for tomorrow, 11/16/14 at 11:00am and will perform a full assessment at that time.    Marja Kayslisa Posie Lillibridge MHA,BSN,RN,CCM [facility] Care Management  [phone]

## 2014-11-16 ENCOUNTER — Ambulatory Visit: Payer: Self-pay | Admitting: *Deleted

## 2014-11-17 ENCOUNTER — Other Ambulatory Visit: Payer: Self-pay | Admitting: *Deleted

## 2014-11-17 ENCOUNTER — Encounter: Payer: Self-pay | Admitting: *Deleted

## 2014-11-18 ENCOUNTER — Encounter: Payer: Self-pay | Admitting: *Deleted

## 2014-11-18 NOTE — Patient Outreach (Signed)
[facility]) Care Management   11/18/2014  [name] [facility] [birth year] [id number]  [name] is an 74 y.o. male  Subjective:  "I'm so happy about how this surgery came out. I can't believe how much better I'm doing already. I'm only having to cath myself once or twice a day. Before I was doing it 6 or 7 times. And I can stand up at the pew at church by myself without leaning on my cane. I can stand up out of this chair a little bit by myself too. I'm ready to start PT but she said not until about June."  Objective:    BP 134/67 mmHg  Pulse 64  Wt 170 lb (77.111 kg)  SpO2 95%  Review of Systems  Constitutional: Positive for weight loss.       Lost and regained 5 pounds over last 4 weeks during perioperative period.   HENT: Negative.   Eyes: Negative.   Respiratory: Negative.   Cardiovascular: Negative.   Gastrointestinal: Negative.   Genitourinary:       Since surgery patient only requiring 1-2 self cath daily as opposed to 6 times daily pre-operatively.  Musculoskeletal:       LE strength improved since surgical revision. Requires wheelchair for mobiity. Can now stand independently with cane.   Skin: Negative.   Neurological: Positive for sensory change and focal weakness.       Weakness BLE.  Endo/Heme/Allergies: Negative.   Psychiatric/Behavioral: Negative.     Physical Exam  Constitutional: He is oriented to person, place, and time. Vital signs are normal. He appears well-developed and well-nourished. He is active.  Cardiovascular: Normal rate, regular rhythm and normal heart sounds.   Respiratory: Effort normal and breath sounds normal.  GI: Soft. Bowel sounds are normal.  Neurological: He is alert and oriented to person, place, and time.  Skin: Skin is warm and dry.     Psychiatric: He has a normal mood and affect. His speech is normal and behavior is normal. Judgment and thought content normal. Cognition and memory are normal.      Current Medications:   Current Outpatient Prescriptions  Medication Sig Dispense Refill  . acetaminophen (TYLENOL) 325 MG tablet Take 1-2 tablets (325-650 mg total) by mouth every 4 (four) hours as needed for mild pain.    Marland Kitchen aspirin 81 MG tablet Take 81 mg by mouth daily.    Marland Kitchen atorvastatin (LIPITOR) 40 MG tablet Take 1 tablet (40 mg total) by mouth daily. 30 tablet 6  . carvedilol (COREG) 6.25 MG tablet Take 1 tablet (6.25 mg total) by mouth 2 (two) times daily. 180 tablet 3  . docusate sodium (COLACE) 100 MG capsule Take 100 mg by mouth daily.    Marland Kitchen doxazosin (CARDURA) 8 MG tablet Take 8 mg by mouth daily.    . nitroGLYCERIN (NITROSTAT) 0.4 MG SL tablet Place 0.4 mg under the tongue every 5 (five) minutes as needed for chest pain. May take up to 3 doses per episode. Contact MD or 911 if unresolved chest pain continues.    Marland Kitchen oxyCODONE-acetaminophen (PERCOCET) 10-325 MG per tablet Take 1 tablet by mouth every 4 (four) hours as needed for pain.    . predniSONE (DELTASONE) 5 MG tablet Take 5 mg by mouth daily with breakfast.    . tamsulosin (FLOMAX) 0.4 MG CAPS capsule Take 1 capsule (0.4 mg total) by mouth daily after breakfast. 30 capsule 1  . vitamin C (ASCORBIC ACID) 500 MG tablet Take  500 mg by mouth daily.    Marland Kitchen. VITAMIN D, CHOLECALCIFEROL, PO Take 1,000 Units by mouth 2 (two) times daily. For vitamin D deficiency     No current facility-administered medications for this visit.    Functional Status:   In your present state of health, do you have any difficulty performing the following activities: 11/18/2014  Hearing? N  Vision? N  Difficulty concentrating or making decisions? N  Walking or climbing stairs? Y  Dressing or bathing? N  Doing errands, shopping? Y  Preparing Food and eating ? N  Using the Toilet? N  In the past six months, have you accidently leaked urine? N  Do you have problems with loss of bowel control? Y  Managing your Medications? N  Managing your Finances? N   Housekeeping or managing your Housekeeping? Y    Depression Screening:    Depression screen [facility] 2/9 11/17/2014 02/04/2011 02/04/2011  Decreased Interest 0 0 0  Down, Depressed, Hopeless 0 0 0  PHQ - 2 Score 0 0 0    Assessment:  [name] looks great post surgically. He is thrilled with the outcome from surgery thus far and is looking forward to more progress. He has [facility]. He is anxious to start PT and understands from his surgeon that she may start PT at home in a few weeks then outpatient/intensive therapies around June.   Plan:  [name] is to call me after his surgery follow up appointment. We will review plan of care and provider instructions at that time.   Goals    . "I really want to be able to walk again or at least get more mobility back in my legs"     Patient notes more ease with ability to stand with out cane since his HNP revision. Sees surgeon in 2 weeks and hopes to be able to start HHPT in 4 weeks.         Marja Kayslisa Gerik Coberly MHA,BSN,RN,CCM [facility] Care Management  [phone]

## 2014-11-29 DIAGNOSIS — Z981 Arthrodesis status: Secondary | ICD-10-CM | POA: Diagnosis not present

## 2014-11-29 DIAGNOSIS — M549 Dorsalgia, unspecified: Secondary | ICD-10-CM | POA: Diagnosis not present

## 2014-11-29 DIAGNOSIS — M4316 Spondylolisthesis, lumbar region: Secondary | ICD-10-CM | POA: Diagnosis not present

## 2014-12-11 DIAGNOSIS — I10 Essential (primary) hypertension: Secondary | ICD-10-CM | POA: Diagnosis not present

## 2014-12-11 DIAGNOSIS — G834 Cauda equina syndrome: Secondary | ICD-10-CM | POA: Diagnosis not present

## 2014-12-11 DIAGNOSIS — I2510 Atherosclerotic heart disease of native coronary artery without angina pectoris: Secondary | ICD-10-CM | POA: Diagnosis not present

## 2014-12-11 DIAGNOSIS — N419 Inflammatory disease of prostate, unspecified: Secondary | ICD-10-CM | POA: Diagnosis not present

## 2014-12-12 ENCOUNTER — Ambulatory Visit (HOSPITAL_COMMUNITY): Payer: Medicare Other | Attending: Orthopedic Surgery | Admitting: Physical Therapy

## 2014-12-12 DIAGNOSIS — Z9889 Other specified postprocedural states: Secondary | ICD-10-CM | POA: Diagnosis not present

## 2014-12-12 DIAGNOSIS — M6281 Muscle weakness (generalized): Secondary | ICD-10-CM | POA: Diagnosis not present

## 2014-12-12 DIAGNOSIS — R2689 Other abnormalities of gait and mobility: Secondary | ICD-10-CM | POA: Diagnosis not present

## 2014-12-12 DIAGNOSIS — R262 Difficulty in walking, not elsewhere classified: Secondary | ICD-10-CM | POA: Insufficient documentation

## 2014-12-12 DIAGNOSIS — R29898 Other symptoms and signs involving the musculoskeletal system: Secondary | ICD-10-CM

## 2014-12-12 NOTE — Therapy (Signed)
[facility] [address] [place], [place], [address] Phone: [phone]   Fax:  [phone]  Physical Therapy Evaluation  Patient Details  Name: Jake MarvelRonald W [name] MRN: [id number] Date of Birth: 02/26/1941 Referring Provider:  Erick ColaceErickson, Melissa M, MD  Encounter Date: 12/12/2014      PT End of Session - 12/12/14 0929    Visit Number 1   Number of Visits 16   Date for PT Re-Evaluation 01/11/15   Authorization Type medicare    Authorization - Visit Number 1   Authorization - Number of Visits 10   PT Start Time [phone]   PT Stop Time 0930   PT Time Calculation (min) 55 min   Activity Tolerance Patient tolerated treatment well      Past Medical History  Diagnosis Date  . Hyperlipidemia   . Small bowel problem     HAD ALOT OF SCAR TISSUE FROM PREVIOUS SURGERIES..NG WAS INSERTED ...Marland Kitchen.Marland Kitchen.NO SURGERY NEEDED...Marland Kitchen.Marland Kitchen.IN FOR 8 DAYS  . Disorder of blood     BEEN TREATED BY DERMATOLOGIST X 4 YRS..."BLOOD BLISTERS"  . Arthritis   . Gout   . Anxiety   . Hx MRSA infection     rt shoulder  . Pneumonia     "I've had it 3-4 times"  . Coronary artery disease     IN 2000   STENT PLACED IN 2012 sees Dr. Dietrich Patesothbart, saw last 2013  . HTN (hypertension)     se[place] Dr. [place]etta [place]Hawkins in [place]  . Small bowel obstruction   . Ventral hernia   . Acute renal failure 04/17/2013  . 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 Anemia, iron deficiency 09/09/2013    Past Surgical History  Procedure Laterality Date  . Sternal surg  2000    HAS HAD 5-6 ON HIS STERNUM; "caught MRSA in it"  . Lumbar laminectomy/decompression microdiscectomy  06/13/2011    Procedure: LUMBAR LAMINECTOMY/DECOMPRESSION MICRODISCECTOMY;  Surgeon: Karn CassisErnesto M Botero;  Location: [facility];  Service: Neurosurgery;  Laterality: N/A;  Lumbar three, lumbar four-five Laminectomy  . Eye surgery      bilateral cataract  . Anterior cervical corpectomy  12/17/11  . Incision and drainage of  wound  ~ 20ll; 12/03/11    "had infection in my right"  . Shoulder open rotator cuff repair  ~ 2011    right  . Peripherally inserted central catheter insertion  2011 & 11/2011  . Cataract extraction w/ intraocular lens  implant, bilateral  ? 2011  . Coronary artery bypass graft  2000    CABG X5  . Back surgery      lumbar  . Tonsillectomy  1949  . Cholecystectomy  2006 "or after"  . Coronary angioplasty with stent placement  2012  . Anterior cervical corpectomy  12/17/2011    Procedure: ANTERIOR CERVICAL CORPECTOMY;  Surgeon: Karn CassisErnesto M Botero, MD;  Location: [facility];  Service: Neurosurgery;  Laterality: N/A;  Anterior Cervical Decompression Fusion Five to Thoracic Two with plating  . Lumbar laminectomy/decompression microdiscectomy Right 06/17/2013    Procedure: LUMBAR LAMINECTOMY/DECOMPRESSION MICRODISCECTOMY 1 LEVEL;  Surgeon: Karn CassisErnesto M Botero, MD;  Location: [facility];  Service: Neurosurgery;  Laterality: Right;  Right L3-4 Microdiskectomy  . Lumbar wound debridement N/A 08/02/2013    Procedure: INCISION AND DRAINAGE OF LUMBAR WOUND DEBRIDEMENT;  Surgeon: Karn CassisErnesto M Botero, MD;  Location: [facility];  Service: Neurosurgery;  Laterality: N/A;  . Shoulder arthroscopy Left 09/13/2013  Procedure: ARTHROSCOPIC IRRIGATION AND DEBRIDEMENT, SYNOVECTOMY ,  LEFT SHOULDER ;  Surgeon: Thera Flake., MD;  Location: [facility];  Service: Orthopedics;  Laterality: Left;    There were no vitals filed for this visit.  Visit Diagnosis:  Bilateral leg weakness  Poor balance  Muscle weakness (generalized)  Difficulty walking      Subjective Assessment - 12/12/14 0933    Subjective [name] states that he had another back surgery seven weeks ago.  He states that he has a urinary infection and if it wasn't for this he would have no pain.  He states that he is doing better than he has been but needs to work on his balance. He is now being referred to PT to assist in his ambulation.      Pertinent History Mr. Camilli  was hospitalized for back surgery 11/2012 with relief of pain but  developed Lt drop foot. He had a second surgery in November for his right side. This surgery was suppose to be in overnight but due to complications he ended up in the hospital for several days he went IP rehab and went home right before Christmas. He then had a flare infection in January and was admitted for an UTI and a Lt rotator cuff surgery. He then went to the [facility]. He stayed at the [facility] for 9 weeks and then went home on 11/18/2013.  He recieved extensive OP surgery after his discharge.  B RTC    How long can you sit comfortably? no problem    How long can you stand comfortably? unable to stand without holding onto walker.  With his walker he is able to stand for 5-10 minutes    How long can you walk comfortably? Pt states he uses a wheelchair in the home and a walker when he comes outside.  He has not walked greater than 3-5 minutes    Patient Stated Goals Pt would like to have better balance and to be able to walk without the walker    Currently in Pain? Yes   Pain Score 6    Pain Location Back   Pain Orientation Anterior   Pain Descriptors / Indicators Aching   Pain Onset More than a month ago   Pain Frequency Constant            [facility] PT Assessment - 12/12/14 0846    Assessment   Medical Diagnosis Lumbar fusion    Onset Date 10/19/14   Next MD Visit 01/11/2015   Prior Therapy prior to this surgery but none after.    Precautions   Precautions Fall   Required Braces or Orthoses Other Brace/Splint   Other Brace/Splint brace secondary to not having a sternum; Lt AFO    Restrictions   Weight Bearing Restrictions No   Balance Screen   Has the patient fallen in the past 6 months No   Has the patient had a decrease in activity level because of a fear of falling?  Yes   Is the patient reluctant to leave their home because of a fear of falling?  Yes   Home Environment    Living Enviornment Private residence   Living Arrangements Spouse/significant other   Type of Home House   Home Access Ramped entrance   Prior Function   Level of Independence Independent with basic ADLs   Vocation Retired   Leisure none; pt is going to start coaching basketball again    [employer]  Overall Cognitive Status Within Functional Limits for tasks assessed   Observation/Other Assessments   Focus on Therapeutic Outcomes (FOTO)  38   Functional Tests   Functional tests Sit to Stand  Standing with no UE support = 4 seconds;    Sit to Stand   Comments 6 in 30 seconds    Strength   Right Hip Flexion 5/5   Right Hip Extension 2/5   Right Hip ABduction 2+/5   Left Hip Flexion 5/5   Left Hip Extension 1/5   Left Hip ABduction 2-/5   Left Hip ADduction 3/5   Right Knee Flexion 1/5   Right Knee Extension 5/5   Left Knee Flexion 1/5   Left Knee Extension 5/5                   [facility] Adult PT Treatment/Exercise - 12/12/14 0001    Exercises   Exercises Knee/Hip   Knee/Hip Exercises: Standing   SLS --  stand without UE support x 3    Knee/Hip Exercises: Supine   Hip Adduction Isometric --  hip abduction for Lt LE; hamset B LE x 10; Clam x 10    Bridges 10 reps   Knee/Hip Exercises: Sidelying   Hip ABduction Strengthening;Right;10 reps                PT Education - 12/12/14 0929    Education provided Yes   Education Details HEP   Person(s) Educated Patient   Methods Explanation;Handout   Comprehension Verbalized understanding;Returned demonstration          PT Short Term Goals - 12/12/14 1259    PT SHORT TERM GOAL #1   Title I HEP   Time 1   Period Weeks   PT SHORT TERM GOAL #2   Title Pt to be able to stand x 2 minutes without UE support to decrease risk of falling    Time 4   Period Weeks   PT SHORT TERM GOAL #3   Title Pt mm strength to improve by once grade to allow pt to walk with walker x 20 mintues    Time 4   Period Weeks    PT SHORT TERM GOAL #4   Title Pain level no greeater than a 3/10    Time 4   Period Weeks           PT Long Term Goals - 12/12/14 1648    PT LONG TERM GOAL #1   Title I in advance HEP   Time 6   Period Weeks   PT LONG TERM GOAL #2   Title Pt to be able to stand for 5 minutes without UE assit to groom self    Time 6   Period Weeks   PT LONG TERM GOAL #3   Title Pt to be able to SLS x 10" to reduce risk of falling   Time 6   Period Weeks   PT LONG TERM GOAL #4   Title Pt to be walking with a cane inside his home   Time 6   Period Weeks               Plan - 12/12/14 1642    Clinical Impression Statement Pt is a 74 yo male who has had recent back surgery.  He had undergone previous failed back surgeries in the past and was walking only short distances with his walker.  He is now being referred to Pt to improve his  balance and strength and maximize his functional abilility.  Examination demonstrates that the pt most significant limitation is balance followed by decrease gluteal and hamsring strregth. Mr. Cessna will benefit  from skilled PT to maximize his functional abiltiy.    Pt will benefit from skilled therapeutic intervention in order to improve on the following deficits Abnormal gait;Decreased activity tolerance;Decreased balance;Difficulty walking;Impaired perceived functional ability;Decreased strength   Rehab Potential Good   PT Frequency 2x / week   PT Treatment/Interventions Therapeutic exercise;Balance training;Therapeutic activities;Functional mobility training;Stair training;Gait training;Patient/family education   PT Next Visit Plan functional squat, tandem stance, SLS, rockerboard, side step with t-band, marching, sit to stand    PT Home Exercise Plan given   Consulted and Agree with Plan of Care Patient          G-Codes - 01-04-2015 1650    Functional Assessment Tool Used foto    Functional Limitation Mobility: Walking and moving around    Mobility: Walking and Moving Around Current Status [phone]) At least 60 percent but less than 80 percent impaired, limited or restricted   Mobility: Walking and Moving Around Goal Status [phone]) At least 40 percent but less than 60 percent impaired, limited or restricted       Problem List Patient Active Problem List   Diagnosis Date Noted  . Muscle weakness (generalized) 12/05/2013  . Tight fascia 12/05/2013  . Decreased range of motion of right shoulder 12/05/2013  . Decreased range of motion of left shoulder 12/05/2013  . Bilateral leg weakness 11/21/2013  . Poor balance 11/21/2013  . Difficulty in walking(719.7) 11/21/2013  . Shoulder pain, left 09/12/2013  . Anemia, iron deficiency 09/09/2013  . Dyspnea 09/07/2013  . UTI (urinary tract infection) 09/07/2013  . Acute CHF (congestive heart failure) 09/07/2013  . Elevated troponin 09/07/2013  . Fever and chills 09/06/2013  . Altered mental status 09/05/2013  . Neurogenic bowel 08/22/2013  . Cauda equina syndrome with neurogenic bladder 08/22/2013  . Gout flare 08/22/2013  . Postoperative wound infection 08/22/2013  . Lumbar degenerative disc disease 08/02/2013  . Post-operative pain 07/29/2013  . Osteonecrosis 05/05/2013  . Small bowel obstruction 04/17/2013  . Acute renal failure 04/17/2013  . Medial meniscus, posterior horn derangement 02/17/2013  . Knee sprain and strain 02/17/2013  . Trigger point with neck pain 03/18/2012  . Rotator cuff tear arthropathy of right shoulder 09/08/2011  . CAD in native artery 01/09/2011  . Ankle fracture 12/25/2010  . Contusion of shoulder, left 12/25/2010  . PEMPHIGUS VULGARIS 07/02/2010  . MRSA 05/13/2010  . PRURITUS 05/13/2010  . SPINAL STENOSIS, LUMBAR 05/13/2010  . HYPERLIPIDEMIA 04/24/2010  . GASTROESOPHAGEAL REFLUX DISEASE 04/24/2010  . VENTRAL HERNIA, INCISIONAL 04/24/2010  . DEGENERATIVE JOINT DISEASE 04/24/2010  . PYOGENIC ARTHRITIS, SHOULDER REGION 02/04/2010  .  HYPERTENSION 01/01/2010  . RUPTURE ROTATOR CUFF 02/19/2009   Virgina Organ, PT CLT [phone]  2015-01-04, 4:52 PM  [facility] [name] [place], [place], [address] Phone: [phone]   Fax:  [phone]

## 2014-12-12 NOTE — Patient Instructions (Signed)
Isometric Abdominal   Lying on back with knees bent, tighten stomach by pressing elbows down. Hold ___5_ seconds. Repeat _10___ times per set. Do 1____ sets per session. Do __10__ sessions per day.  http://orth.exer.us/1086   Copyright  VHI. All rights reserved.  Bridging   Slowly raise buttocks from floor, keeping stomach tight. Repeat __10-15__ times per set. Do _1___ sets per session. Do __3__ sessions per day.  http://orth.exer.us/1096   Copyright  VHI. All rights reserved.  Isometric Gluteals   Tighten buttock muscles. Repeat _10___ times per set. Do _1___ sets per session. Do __5__ sessions per day.  http://orth.exer.us/1126   Copyright  VHI. All rights reserved.  Hip Abduction / Adduction: with Extended Knee (Supine)   Bring left leg out to side and return. Keep knee straight. Repeat __10-15__ times per set. Do __1__ sets per session. Do __3__ sessions per day.  http://orth.exer.us/680   Copyright  VHI. All rights reserved.  Strengthening: Hip Abduction (Side-Lying)   Tighten muscles on front of right thigh, then lift leg __8__ inches from surface, keeping knee locked.  Repeat _10-15___ times per set. Do ___1_ sets per session. Do ___3_ sessions per day.  http://orth.exer.us/622   Copyright  VHI. All rights reserved.  Hip Abduction / Adduction: with Knee Flexion (Supine)   With right knee bent, gently lower knee to side and return. Repeat __10__ times per set. Do __1__ sets per session. Do ___3_ sessions per day.  http://orth.exer.us/682   Copyright  VHI. All rights reserved.  Strengthening: Hamstring Set   With left foot turned in, tighten muscles on back of thigh by pulling heel down into surface. Hold __3-5__ seconds. Repeat _10___ times per set. Do _1___ sets per session. Do __3__ sessions per day.  http://orth.exer.us/604   Copyright  VHI. All rights reserved.  Strengthening: Hamstring Set   With left foot turned out, tighten  muscles on back of thigh by pulling heel down into surface. Hold 3-__5__ seconds. Repeat __10-15__ times per set. Do _1___ sets per session. Do __3__ sessions per day.  http://orth.exer.us/606   Copyright  VHI. All rights reserved.

## 2014-12-13 NOTE — Addendum Note (Signed)
Addended by: Bella KennedyUSSELL, Gabby Rackers J on: 12/13/2014 04:23 PM   Modules accepted: Orders

## 2014-12-15 DIAGNOSIS — N390 Urinary tract infection, site not specified: Secondary | ICD-10-CM | POA: Diagnosis not present

## 2014-12-15 DIAGNOSIS — N419 Inflammatory disease of prostate, unspecified: Secondary | ICD-10-CM | POA: Diagnosis not present

## 2014-12-20 ENCOUNTER — Ambulatory Visit (HOSPITAL_COMMUNITY): Payer: Medicare Other

## 2014-12-20 DIAGNOSIS — Z9889 Other specified postprocedural states: Secondary | ICD-10-CM | POA: Diagnosis not present

## 2014-12-20 DIAGNOSIS — M6281 Muscle weakness (generalized): Secondary | ICD-10-CM | POA: Diagnosis not present

## 2014-12-20 DIAGNOSIS — R2689 Other abnormalities of gait and mobility: Secondary | ICD-10-CM

## 2014-12-20 DIAGNOSIS — R262 Difficulty in walking, not elsewhere classified: Secondary | ICD-10-CM

## 2014-12-20 DIAGNOSIS — R29898 Other symptoms and signs involving the musculoskeletal system: Secondary | ICD-10-CM

## 2014-12-20 NOTE — Therapy (Signed)
[facility] [address] [place], [place], [address] Phone: [phone]   Fax:  [phone]  Physical Therapy Treatment  Patient Details  Name: [name] MRN: [id number] Date of Birth: 10-Dec-1940 Referring Provider:  [name], MD  Encounter Date: 12/20/2014      PT End of Session - 12/20/14 1747    Visit Number 2   Number of Visits 16   Date for PT Re-Evaluation 01/11/15   Authorization Type medicare    Authorization - Visit Number 2   Authorization - Number of Visits 10   PT Start Time 1652   PT Stop Time 1740   PT Time Calculation (min) 48 min   Activity Tolerance Patient tolerated treatment well   Behavior During Therapy [facility] for tasks assessed/performed      Past Medical History  Diagnosis Date  . Hyperlipidemia   . Small bowel problem     HAD ALOT OF SCAR TISSUE FROM PREVIOUS SURGERIES..NG WAS INSERTED ...Marland KitchenMarland KitchenNO SURGERY NEEDED...Marland KitchenMarland KitchenIN FOR 8 DAYS  . Disorder of blood     BEEN TREATED BY DERMATOLOGIST X 4 YRS..."BLOOD BLISTERS"  . Arthritis   . Gout   . Anxiety   . Hx MRSA infection     rt shoulder  . Pneumonia     "I've had it 3-4 times"  . Coronary artery disease     IN 2000   STENT PLACED IN 2012 sees Dr. Dietrich Pates, saw last 2013  . HTN (hypertension)     sees [name] in [place]  . Small bowel obstruction   . Ventral hernia   . Acute renal failure 04/17/2013  . 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 Anemia, iron deficiency 09/09/2013    Past Surgical History  Procedure Laterality Date  . Sternal surg  2000    HAS HAD 5-6 ON HIS STERNUM; "caught MRSA in it"  . Lumbar laminectomy/decompression microdiscectomy  06/13/2011    Procedure: LUMBAR LAMINECTOMY/DECOMPRESSION MICRODISCECTOMY;  Surgeon: Karn Cassis;  [facility]: [facility];  Service: Neurosurgery;  Laterality: N/A;  Lumbar three, lumbar four-five Laminectomy  . Eye surgery      bilateral cataract  . Anterior  cervical corpectomy  12/17/11  . Incision and drainage of wound  ~ 20ll; 12/03/11    "had infection in my right"  . Shoulder open rotator cuff repair  ~ 2011    right  . Peripherally inserted central catheter insertion  2011 & 11/2011  . Cataract extraction w/ intraocular lens  implant, bilateral  ? 2011  . Coronary artery bypass graft  2000    CABG X5  . Back surgery      lumbar  . Tonsillectomy  1949  . Cholecystectomy  2006 "or after"  . Coronary angioplasty with stent placement  2012  . Anterior cervical corpectomy  12/17/2011    Procedure: ANTERIOR CERVICAL CORPECTOMY;  Surgeon: Karn Cassis, MD;  [facility]: [facility];  Service: Neurosurgery;  Laterality: N/A;  Anterior Cervical Decompression Fusion Five to Thoracic Two with plating  . Lumbar laminectomy/decompression microdiscectomy Right 06/17/2013    Procedure: LUMBAR LAMINECTOMY/DECOMPRESSION MICRODISCECTOMY 1 LEVEL;  Surgeon: Karn Cassis, MD;  [facility]: [facility];  Service: Neurosurgery;  Laterality: Right;  Right L3-4 Microdiskectomy  . Lumbar wound debridement N/A 08/02/2013    Procedure: INCISION AND DRAINAGE OF LUMBAR WOUND DEBRIDEMENT;  Surgeon: Karn Cassis, MD;  [facility]: [facility];  Service: Neurosurgery;  Laterality:  N/A;  . Shoulder arthroscopy Left 09/13/2013    Procedure: ARTHROSCOPIC IRRIGATION AND DEBRIDEMENT, SYNOVECTOMY ,  LEFT SHOULDER ;  Surgeon: Thera FlakeW D Caffrey Jr., MD;  Location: [facility];  Service: Orthopedics;  Laterality: Left;    There were no vitals filed for this visit.  Visit Diagnosis:  Bilateral leg weakness  Poor balance  Muscle weakness (generalized)  Difficulty walking      Subjective Assessment - 12/20/14 1709    Subjective Pt stated he has a prostate infection, only pain he has is prostate pain today.     Currently in Pain? Yes   Pain Score 5    Pain Location --  Prostate              [facility] Adult PT Treatment/Exercise - 12/20/14 0001    Exercises   Exercises  Knee/Hip   Knee/Hip Exercises: Standing   Functional Squat 15 reps  10 STS 5x each LE back   Rocker Board 2 minutes   Rocker Board Limitations R/L and A/P   SLS 2x 30" with HHA   Other Standing Knee Exercises Tandem stance 3x 30"; marching 10x with HHA   Other Standing Knee Exercises Sidestepping 4RT inside // bars   Knee/Hip Exercises: Sidelying   Hip ABduction Both;10 reps   Clams 10x Bil with therapist facilitation             PT Short Term Goals - 12/20/14 1753    PT SHORT TERM GOAL #1   Title I HEP   Status On-going   PT SHORT TERM GOAL #2   Title Pt to be able to stand x 2 minutes without UE support to decrease risk of falling    PT SHORT TERM GOAL #3   Title Pt mm strength to improve by once grade to allow pt to walk with walker x 20 mintues    PT SHORT TERM GOAL #4   Title Pain level no greeater than a 3/10            PT Long Term Goals - 12/20/14 1753    PT LONG TERM GOAL #1   Title I in advance HEP   PT LONG TERM GOAL #2   Title Pt to be able to stand for 5 minutes without UE assit to groom self    PT LONG TERM GOAL #3   Title Pt to be able to SLS x 10" to reduce risk of falling   PT LONG TERM GOAL #4   Title Pt to be walking with a cane inside his home               Plan - 12/20/14 1748    Clinical Impression Statement Reviewed goals, compliance with HEP and copy of initial evaluation given.  Session focus on functional strengthening and static standing activiites to improve balance.  HHA required with balance activities due to weakness.  AAROM required with Lt LE abduciton due to weakness.  No reports of back pain through session just c/o prostate pain due to infections. Pt has MD apt to follow up on prostate pain on Friday.     PT Next Visit Plan Continue with current PT POC with exercises including: functional squat, tandem stance, SLS, rockerboard, side step with t-band, marching, sit to stand         Problem List Patient Active Problem  List   Diagnosis Date Noted  . Muscle weakness (generalized) 12/05/2013  . Tight fascia 12/05/2013  . Decreased range of  motion of right shoulder 12/05/2013  . Decreased range of motion of left shoulder 12/05/2013  . Bilateral leg weakness 11/21/2013  . Poor balance 11/21/2013  . Difficulty in walking(719.7) 11/21/2013  . Shoulder pain, left 09/12/2013  . Anemia, iron deficiency 09/09/2013  . Dyspnea 09/07/2013  . UTI (urinary tract infection) 09/07/2013  . Acute CHF (congestive heart failure) 09/07/2013  . Elevated troponin 09/07/2013  . Fever and chills 09/06/2013  . Altered mental status 09/05/2013  . Neurogenic bowel 08/22/2013  . Cauda equina syndrome with neurogenic bladder 08/22/2013  . Gout flare 08/22/2013  . Postoperative wound infection 08/22/2013  . Lumbar degenerative disc disease 08/02/2013  . Post-operative pain 07/29/2013  . Osteonecrosis 05/05/2013  . Small bowel obstruction 04/17/2013  . Acute renal failure 04/17/2013  . Medial meniscus, posterior horn derangement 02/17/2013  . Knee sprain and strain 02/17/2013  . Trigger point with neck pain 03/18/2012  . Rotator cuff tear arthropathy of right shoulder 09/08/2011  . CAD in native artery 01/09/2011  . Ankle fracture 12/25/2010  . Contusion of shoulder, left 12/25/2010  . PEMPHIGUS VULGARIS 07/02/2010  . MRSA 05/13/2010  . PRURITUS 05/13/2010  . SPINAL STENOSIS, LUMBAR 05/13/2010  . HYPERLIPIDEMIA 04/24/2010  . GASTROESOPHAGEAL REFLUX DISEASE 04/24/2010  . VENTRAL HERNIA, INCISIONAL 04/24/2010  . DEGENERATIVE JOINT DISEASE 04/24/2010  . PYOGENIC ARTHRITIS, SHOULDER REGION 02/04/2010  . HYPERTENSION 01/01/2010  . RUPTURE ROTATOR CUFF 02/19/2009   Juel Burrow, PTA   Juel Burrow 12/20/2014, 5:56 PM  [facility] [address] [place], [place], [address] Phone: [phone]   Fax:  [phone]

## 2014-12-22 ENCOUNTER — Ambulatory Visit (INDEPENDENT_AMBULATORY_CARE_PROVIDER_SITE_OTHER): Payer: Medicare Other | Admitting: Urology

## 2014-12-22 DIAGNOSIS — N319 Neuromuscular dysfunction of bladder, unspecified: Secondary | ICD-10-CM | POA: Diagnosis not present

## 2014-12-22 DIAGNOSIS — Z87440 Personal history of urinary (tract) infections: Secondary | ICD-10-CM | POA: Diagnosis not present

## 2014-12-22 DIAGNOSIS — M549 Dorsalgia, unspecified: Secondary | ICD-10-CM | POA: Diagnosis not present

## 2014-12-27 ENCOUNTER — Ambulatory Visit (HOSPITAL_COMMUNITY): Payer: Medicare Other | Attending: Orthopedic Surgery

## 2014-12-27 DIAGNOSIS — R262 Difficulty in walking, not elsewhere classified: Secondary | ICD-10-CM | POA: Insufficient documentation

## 2014-12-27 DIAGNOSIS — M6281 Muscle weakness (generalized): Secondary | ICD-10-CM | POA: Insufficient documentation

## 2014-12-27 DIAGNOSIS — Z9889 Other specified postprocedural states: Secondary | ICD-10-CM | POA: Diagnosis not present

## 2014-12-27 DIAGNOSIS — R2689 Other abnormalities of gait and mobility: Secondary | ICD-10-CM | POA: Insufficient documentation

## 2014-12-27 DIAGNOSIS — R29898 Other symptoms and signs involving the musculoskeletal system: Secondary | ICD-10-CM

## 2014-12-27 NOTE — Therapy (Signed)
[facility] [address] [place], [place], [address] Phone: [phone]   Fax:  [phone]  Physical Therapy Treatment  Patient Details  Name: [name] MRN: [id number] Date of Birth: 11/08/1940 Referring Provider:  Kari BaarsHawkins, Edward, MD  Encounter Date: 12/27/2014      PT End of Session - 12/27/14 1528    Visit Number 3   Number of Visits 16   Date for PT Re-Evaluation 01/11/15   Authorization Type medicare    Authorization - Visit Number 3   Authorization - Number of Visits 10   PT Start Time 1520   PT Stop Time 1602   PT Time Calculation (min) 42 min   Equipment Utilized During Treatment Gait belt   Activity Tolerance Patient tolerated treatment well   Behavior During Therapy [facility] for tasks assessed/performed      Past Medical History  Diagnosis Date  . Hyperlipidemia   . Small bowel problem     HAD ALOT OF SCAR TISSUE FROM PREVIOUS SURGERIES..NG WAS INSERTED ...Marland Kitchen.Marland Kitchen.NO SURGERY NEEDED...Marland Kitchen.Marland Kitchen.IN FOR 8 DAYS  . Disorder of blood     BEEN TREATED BY DERMATOLOGIST X 4 YRS..."BLOOD BLISTERS"  . Arthritis   . Gout   . Anxiety   . Hx MRSA infection     rt shoulder  . Pneumonia     "I've had it 3-4 times"  . Coronary artery disease     IN 2000   STENT PLACED IN 2012 sees Dr. Dietrich Patesothbart, saw last 2013  . HTN (hypertension)     [place]  . Small bowel obstruction   . Ventral hernia   . Acute renal failure 04/17/2013  . 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 Anemia, iron deficiency 09/09/2013    Past Surgical History  Procedure Laterality Date  . Sternal surg  2000    HAS HAD 5-6 ON HIS STERNUM; "caught MRSA in it"  . Lumbar laminectomy/decompression microdiscectomy  06/13/2011    Procedure: LUMBAR LAMINECTOMY/DECOMPRESSION MICRODISCECTOMY;  Surgeon: Karn CassisErnesto M Botero;  [facility]: [facility];  Service: Neurosurgery;  Laterality: N/A;  Lumbar three, lumbar four-five Laminectomy  . Eye  surgery      bilateral cataract  . Anterior cervical corpectomy  12/17/11  . Incision and drainage of wound  ~ 20ll; 12/03/11    "had infection in my right"  . Shoulder open rotator cuff repair  ~ 2011    right  . Peripherally inserted central catheter insertion  2011 & 11/2011  . Cataract extraction w/ intraocular lens  implant, bilateral  ? 2011  . Coronary artery bypass graft  2000    CABG X5  . Back surgery      lumbar  . Tonsillectomy  1949  . Cholecystectomy  2006 "or after"  . Coronary angioplasty with stent placement  2012  . Anterior cervical corpectomy  12/17/2011    Procedure: ANTERIOR CERVICAL CORPECTOMY;  Surgeon: Karn CassisErnesto M Botero, MD;  Location: [facility];  Service: Neurosurgery;  Laterality: N/A;  Anterior Cervical Decompression Fusion Five to Thoracic Two with plating  . Lumbar laminectomy/decompression microdiscectomy Right 06/17/2013    Procedure: LUMBAR LAMINECTOMY/DECOMPRESSION MICRODISCECTOMY 1 LEVEL;  Surgeon: Karn CassisErnesto M Botero, MD;  Location: [facility];  Service: Neurosurgery;  Laterality: Right;  Right L3-4 Microdiskectomy  . Lumbar wound debridement N/A 08/02/2013    Procedure: INCISION AND DRAINAGE OF LUMBAR WOUND DEBRIDEMENT;  Surgeon: Karn CassisErnesto M Botero, MD;  Location:  [facility];  Service: Neurosurgery;  Laterality: N/A;  . Shoulder arthroscopy Left 09/13/2013    Procedure: ARTHROSCOPIC IRRIGATION AND DEBRIDEMENT, SYNOVECTOMY ,  LEFT SHOULDER ;  Surgeon: Thera Flake., MD;  Location: [facility];  Service: Orthopedics;  Laterality: Left;    There were no vitals filed for this visit.  Visit Diagnosis:  Bilateral leg weakness  Poor balance  Muscle weakness (generalized)  Difficulty walking      Subjective Assessment - 12/27/14 1524    Subjective Pt stated he continues to have prostate pain, waiting on call from [facility] for apt.  Reported he did stairs on Monday with railing   Currently in Pain? Yes   Pain Score 5    Pain Location --  Prostate                          [facility] Adult PT Treatment/Exercise - 12/27/14 0001    Exercises   Exercises Knee/Hip   Knee/Hip Exercises: Standing   Functional Squat 15 reps   Rocker Board 2 minutes   Rocker Board Limitations R/L and A/P   SLS 3x 30" with HHA   Other Standing Knee Exercises Tandem stance 3x 30" with 1 HHA; marching 10x with Bil HHA   Other Standing Knee Exercises Sidestepping with GTB 4RT inside // bars; standing with no HHA 10x with 16" max wtihout HHA   Knee/Hip Exercises: Seated   Other Seated Knee Exercises 10 STS 5 reps with each foot behind   Knee/Hip Exercises: Sidelying   Hip ABduction Right;10 reps   Clams 10x Bil with therapist facilitation                  PT Short Term Goals - 12/27/14 1528    PT SHORT TERM GOAL #1   Title I HEP   Status On-going   PT SHORT TERM GOAL #2   Title Pt to be able to stand x 2 minutes without UE support to decrease risk of falling    Status On-going   PT SHORT TERM GOAL #3   Title Pt mm strength to improve by once grade to allow pt to walk with walker x 20 mintues    Status On-going   PT SHORT TERM GOAL #4   Title Pain level no greeater than a 3/10    Status On-going           PT Long Term Goals - 12/27/14 1528    PT LONG TERM GOAL #1   Title I in advance HEP   PT LONG TERM GOAL #2   Title Pt to be able to stand for 5 minutes without UE assit to groom self    PT LONG TERM GOAL #3   Title Pt to be able to SLS x 10" to reduce risk of falling   PT LONG TERM GOAL #4   Title Pt to be walking with a cane inside his home               Plan - 12/27/14 1548    Clinical Impression Statement Continued session focus to improve functional strengthening and improve static standing without HHA.  Pt able to stand 16" tops without HHA.  HHA was required for balance activities.  Added theraband with sidestepping for gluteal strengthening with HHA.  No reports of increased pain through session except for  prostate pain.     PT Next Visit Plan Continue with current PT POC  with exercises including: functional squat, tandem stance, SLS, rockerboard, side step with t-band, marching, sit to stand   Begin lateral stair training next sesson.        Problem List Patient Active Problem List   Diagnosis Date Noted  . Muscle weakness (generalized) 12/05/2013  . Tight fascia 12/05/2013  . Decreased range of motion of right shoulder 12/05/2013  . Decreased range of motion of left shoulder 12/05/2013  . Bilateral leg weakness 11/21/2013  . Poor balance 11/21/2013  . Difficulty in walking(719.7) 11/21/2013  . Shoulder pain, left 09/12/2013  . Anemia, iron deficiency 09/09/2013  . Dyspnea 09/07/2013  . UTI (urinary tract infection) 09/07/2013  . Acute CHF (congestive heart failure) 09/07/2013  . Elevated troponin 09/07/2013  . Fever and chills 09/06/2013  . Altered mental status 09/05/2013  . Neurogenic bowel 08/22/2013  . Cauda equina syndrome with neurogenic bladder 08/22/2013  . Gout flare 08/22/2013  . Postoperative wound infection 08/22/2013  . Lumbar degenerative disc disease 08/02/2013  . Post-operative pain 07/29/2013  . Osteonecrosis 05/05/2013  . Small bowel obstruction 04/17/2013  . Acute renal failure 04/17/2013  . Medial meniscus, posterior horn derangement 02/17/2013  . Knee sprain and strain 02/17/2013  . Trigger point with neck pain 03/18/2012  . Rotator cuff tear arthropathy of right shoulder 09/08/2011  . CAD in native artery 01/09/2011  . Ankle fracture 12/25/2010  . Contusion of shoulder, left 12/25/2010  . PEMPHIGUS VULGARIS 07/02/2010  . MRSA 05/13/2010  . PRURITUS 05/13/2010  . SPINAL STENOSIS, LUMBAR 05/13/2010  . HYPERLIPIDEMIA 04/24/2010  . GASTROESOPHAGEAL REFLUX DISEASE 04/24/2010  . VENTRAL HERNIA, INCISIONAL 04/24/2010  . DEGENERATIVE JOINT DISEASE 04/24/2010  . PYOGENIC ARTHRITIS, SHOULDER REGION 02/04/2010  . HYPERTENSION 01/01/2010  . RUPTURE ROTATOR  CUFF 02/19/2009   Juel Burrow, PTA  Juel Burrow 12/27/2014, 8:16 PM   [facility] [address] [place], [place], [address] Phone: [phone]   Fax:  [phone]

## 2014-12-29 ENCOUNTER — Ambulatory Visit (HOSPITAL_COMMUNITY): Payer: Medicare Other

## 2014-12-29 DIAGNOSIS — M25612 Stiffness of left shoulder, not elsewhere classified: Secondary | ICD-10-CM

## 2014-12-29 DIAGNOSIS — M6281 Muscle weakness (generalized): Secondary | ICD-10-CM | POA: Diagnosis not present

## 2014-12-29 DIAGNOSIS — R262 Difficulty in walking, not elsewhere classified: Secondary | ICD-10-CM

## 2014-12-29 DIAGNOSIS — R2689 Other abnormalities of gait and mobility: Secondary | ICD-10-CM

## 2014-12-29 DIAGNOSIS — M6289 Other specified disorders of muscle: Secondary | ICD-10-CM

## 2014-12-29 DIAGNOSIS — G834 Cauda equina syndrome: Secondary | ICD-10-CM

## 2014-12-29 DIAGNOSIS — M25611 Stiffness of right shoulder, not elsewhere classified: Secondary | ICD-10-CM

## 2014-12-29 DIAGNOSIS — Z9889 Other specified postprocedural states: Secondary | ICD-10-CM | POA: Diagnosis not present

## 2014-12-29 DIAGNOSIS — R29898 Other symptoms and signs involving the musculoskeletal system: Secondary | ICD-10-CM

## 2014-12-29 DIAGNOSIS — M629 Disorder of muscle, unspecified: Secondary | ICD-10-CM

## 2014-12-29 NOTE — Therapy (Signed)
[facility] [address] [place], [place], [address] Phone: [phone]   Fax:  [phone]  Physical Therapy Treatment  Patient Details  Name: [name] MRN: [id number] Date of Birth: 10/10/1940 Referring Provider:  Erick ColaceErickson, Melissa M, MD  Encounter Date: 12/29/2014      PT End of Session - 12/29/14 1019    Visit Number 4   Number of Visits 16   Date for PT Re-Evaluation 01/11/15   Authorization Type medicare    Authorization - Visit Number 4   Authorization - Number of Visits 10   PT Start Time [phone]   PT Stop Time 1017   PT Time Calculation (min) 41 min   Equipment Utilized During Treatment Gait belt   Activity Tolerance Patient tolerated treatment well   Behavior During Therapy [facility] for tasks assessed/performed      Past Medical History  Diagnosis Date  . Hyperlipidemia   . Small bowel problem     HAD ALOT OF SCAR TISSUE FROM PREVIOUS SURGERIES..NG WAS INSERTED ...Marland Kitchen.Marland Kitchen.NO SURGERY NEEDED...Marland Kitchen.Marland Kitchen.IN FOR 8 DAYS  . Disorder of blood     BEEN TREATED BY DERMATOLOGIST X 4 YRS..."BLOOD BLISTERS"  . Arthritis   . Gout   . Anxiety   . Hx MRSA infection     rt shoulder  . Pneumonia     "I've had it 3-4 times"  . Coronary artery disease     IN 2000   STENT PLACED IN 2012 sees Dr. Dietrich Patesothbart, saw last 2013  . HTN (hypertension)     [place]. [place]  . Small bowel obstruction   . Ventral hernia   . Acute renal failure 04/17/2013  . 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 Anemia, iron deficiency 09/09/2013    Past Surgical History  Procedure Laterality Date  . Sternal surg  2000    HAS HAD 5-6 ON HIS STERNUM; "caught MRSA in it"  . Lumbar laminectomy/decompression microdiscectomy  06/13/2011    Procedure: LUMBAR LAMINECTOMY/DECOMPRESSION MICRODISCECTOMY;  Surgeon: Karn CassisErnesto M Botero;  [facility];  Service: Neurosurgery;  Laterality: N/A;  Lumbar three, lumbar four-five Laminectomy  . Eye  surgery      bilateral cataract  . Anterior cervical corpectomy  12/17/11  . Incision and drainage of wound  ~ 20ll; 12/03/11    "had infection in my right"  . Shoulder open rotator cuff repair  ~ 2011    right  . Peripherally inserted central catheter insertion  2011 & 11/2011  . Cataract extraction w/ intraocular lens  implant, bilateral  ? 2011  . Coronary artery bypass graft  2000    CABG X5  . Back surgery      lumbar  . Tonsillectomy  1949  . Cholecystectomy  2006 "or after"  . Coronary angioplasty with stent placement  2012  . Anterior cervical corpectomy  12/17/2011    Procedure: ANTERIOR CERVICAL CORPECTOMY;  Surgeon: Karn CassisErnesto M Botero, MD;  [facility]: [facility];  Service: Neurosurgery;  Laterality: N/A;  Anterior Cervical Decompression Fusion Five to Thoracic Two with plating  . Lumbar laminectomy/decompression microdiscectomy Right 06/17/2013    Procedure: LUMBAR LAMINECTOMY/DECOMPRESSION MICRODISCECTOMY 1 LEVEL;  Surgeon: Karn CassisErnesto M Botero, MD;  [facility]: [facility];  Service: Neurosurgery;  Laterality: Right;  Right L3-4 Microdiskectomy  . Lumbar wound debridement N/A 08/02/2013    Procedure: INCISION AND DRAINAGE OF LUMBAR WOUND DEBRIDEMENT;  Surgeon: Karn CassisErnesto M Botero, MD;  [facility]: [facility];  Service: Neurosurgery;  Laterality: N/A;  . Shoulder arthroscopy Left 09/13/2013    Procedure: ARTHROSCOPIC IRRIGATION AND DEBRIDEMENT, SYNOVECTOMY ,  LEFT SHOULDER ;  Surgeon: Thera Flake., MD;  Location: [facility];  Service: Orthopedics;  Laterality: Left;    There were no vitals filed for this visit.  Visit Diagnosis:  Bilateral leg weakness  Poor balance  Muscle weakness (generalized)  Difficulty walking  Tight fascia  Decreased range of motion of right shoulder  Decreased range of motion of left shoulder  Cauda equina syndrome      Subjective Assessment - 12/29/14 0940    Subjective Pt stated he continues to have prostate pain 5/10, received call from [facility] for apt  (wed 6/8).  Reported he did stairs on wednesday with railing   Pertinent History [name]  was hospitalized for back surgery 11/2012 with relief of pain but  developed Lt drop foot. He had a second surgery in November for his right side. This surgery was suppose to be in overnight but due to complications he ended up in the hospital for several days he went IP rehab and went home right before Christmas. He then had a flare infection in January and was admitted for an UTI and a Lt rotator cuff surgery. He then went to the [facility]. He stayed at the [facility] for 9 weeks and then went home on 11/18/2013.  He recieved extensive OP surgery after his discharge.  B RTC    How long can you sit comfortably? no problem    How long can you stand comfortably? 30-minutes with walker (estimated)    How long can you walk comfortably? 5-8 minutes   Patient Stated Goals Pt would like to have better balance and to be able to walk without the walker    Currently in Pain? Yes   Pain Score 5    Pain Location --  prostate                         [facility] Adult PT Treatment/Exercise - 12/29/14 0001    Ambulation/Gait   Ambulation/Gait Yes   Ambulation/Gait Assistance 4: Min guard   Ambulation Distance (Feet) 450 Feet   Assistive device Rolling walker   Gait velocity 0.45   Knee/Hip Exercises: Standing   Functional Squat 15 reps   Other Standing Knee Exercises Tandem stance 3x 30" bilat with 1 HHA;    Knee/Hip Exercises: Supine   Bridges 10 reps  assisted lift for additional ROM   Other Supine Knee Exercises Short arc quads 2x10  4# on R, 3# on L    Knee/Hip Exercises: Sidelying   Hip ABduction 10 reps;Both  2x10 supine                  PT Short Term Goals - 12/27/14 1528    PT SHORT TERM GOAL #1   Title I HEP   Status On-going   PT SHORT TERM GOAL #2   Title Pt to be able to stand x 2 minutes without UE support to decrease risk of falling    Status On-going   PT  SHORT TERM GOAL #3   Title Pt mm strength to improve by once grade to allow pt to walk with walker x 20 mintues    Status On-going   PT SHORT TERM GOAL #4   Title Pain level no greeater than a 3/10    Status  On-going           PT Long Term Goals - 12/27/14 1528    PT LONG TERM GOAL #1   Title I in advance HEP   PT LONG TERM GOAL #2   Title Pt to be able to stand for 5 minutes without UE assit to groom self    PT LONG TERM GOAL #3   Title Pt to be able to SLS x 10" to reduce risk of falling   PT LONG TERM GOAL #4   Title Pt to be walking with a cane inside his home               Plan - 12/29/14 1020    Clinical Impression Statement Pt making progress toward goals as evdienced by improved activity tolerance and ambulation distance. Continue to focus on strengthening of hip stabilizers and quads to improve strength in fucntional mobility. Pt balance is improving but remains largely impaired by focal weakness.    Pt will benefit from skilled therapeutic intervention in order to improve on the following deficits Abnormal gait;Decreased activity tolerance;Decreased balance;Difficulty walking;Impaired perceived functional ability;Decreased strength   Rehab Potential Good   PT Frequency 2x / week   PT Treatment/Interventions Therapeutic exercise;Balance training;Therapeutic activities;Functional mobility training;Stair training;Gait training;Patient/family education   PT Next Visit Plan Continue with current PT POC with exercises including: functional squat, tandem stance, SLS, rockerboard, side step with t-band, marching, sit to stand   Begin lateral stair training next sesson.   PT Home Exercise Plan No changes; encouraged pt to remain consistent with HEP.    Consulted and Agree with Plan of Care Patient        Problem List Patient Active Problem List   Diagnosis Date Noted  . Muscle weakness (generalized) 12/05/2013  . Tight fascia 12/05/2013  . Decreased range of motion  of right shoulder 12/05/2013  . Decreased range of motion of left shoulder 12/05/2013  . Bilateral leg weakness 11/21/2013  . Poor balance 11/21/2013  . Difficulty in walking(719.7) 11/21/2013  . Shoulder pain, left 09/12/2013  . Anemia, iron deficiency 09/09/2013  . Dyspnea 09/07/2013  . UTI (urinary tract infection) 09/07/2013  . Acute CHF (congestive heart failure) 09/07/2013  . Elevated troponin 09/07/2013  . Fever and chills 09/06/2013  . Altered mental status 09/05/2013  . Neurogenic bowel 08/22/2013  . Cauda equina syndrome with neurogenic bladder 08/22/2013  . Gout flare 08/22/2013  . Postoperative wound infection 08/22/2013  . Lumbar degenerative disc disease 08/02/2013  . Post-operative pain 07/29/2013  . Osteonecrosis 05/05/2013  . Small bowel obstruction 04/17/2013  . Acute renal failure 04/17/2013  . Medial meniscus, posterior horn derangement 02/17/2013  . Knee sprain and strain 02/17/2013  . Trigger point with neck pain 03/18/2012  . Rotator cuff tear arthropathy of right shoulder 09/08/2011  . CAD in native artery 01/09/2011  . Ankle fracture 12/25/2010  . Contusion of shoulder, left 12/25/2010  . PEMPHIGUS VULGARIS 07/02/2010  . MRSA 05/13/2010  . PRURITUS 05/13/2010  . SPINAL STENOSIS, LUMBAR 05/13/2010  . HYPERLIPIDEMIA 04/24/2010  . GASTROESOPHAGEAL REFLUX DISEASE 04/24/2010  . VENTRAL HERNIA, INCISIONAL 04/24/2010  . DEGENERATIVE JOINT DISEASE 04/24/2010  . PYOGENIC ARTHRITIS, SHOULDER REGION 02/04/2010  . HYPERTENSION 01/01/2010  . RUPTURE ROTATOR CUFF 02/19/2009    Buccola,Allan C 12/29/2014, 10:27 AM  10:27 AM  Rosamaria Lints, PT, DPT [place] License # [id number]       [facility] [address] [place], [place],  [place] Phone: [phone]   Fax:  [phone]

## 2015-01-02 ENCOUNTER — Encounter (HOSPITAL_COMMUNITY): Payer: Medicare Other

## 2015-01-03 ENCOUNTER — Ambulatory Visit (HOSPITAL_COMMUNITY): Payer: Medicare Other | Admitting: Physical Therapy

## 2015-01-03 DIAGNOSIS — Z981 Arthrodesis status: Secondary | ICD-10-CM | POA: Diagnosis not present

## 2015-01-03 DIAGNOSIS — N411 Chronic prostatitis: Secondary | ICD-10-CM | POA: Diagnosis not present

## 2015-01-03 DIAGNOSIS — M549 Dorsalgia, unspecified: Secondary | ICD-10-CM | POA: Diagnosis not present

## 2015-01-04 ENCOUNTER — Encounter (HOSPITAL_COMMUNITY): Payer: Medicare Other

## 2015-01-05 ENCOUNTER — Ambulatory Visit (HOSPITAL_COMMUNITY): Payer: Medicare Other

## 2015-01-05 DIAGNOSIS — Z9889 Other specified postprocedural states: Secondary | ICD-10-CM | POA: Diagnosis not present

## 2015-01-05 DIAGNOSIS — R29898 Other symptoms and signs involving the musculoskeletal system: Secondary | ICD-10-CM

## 2015-01-05 DIAGNOSIS — M6281 Muscle weakness (generalized): Secondary | ICD-10-CM

## 2015-01-05 DIAGNOSIS — R262 Difficulty in walking, not elsewhere classified: Secondary | ICD-10-CM | POA: Diagnosis not present

## 2015-01-05 DIAGNOSIS — R2689 Other abnormalities of gait and mobility: Secondary | ICD-10-CM

## 2015-01-05 NOTE — Therapy (Signed)
[facility] [address] [place], [place], [address] Phone: [phone]   Fax:  [phone]  Physical Therapy Treatment  Patient Details  Name: [name] MRN: [id number] Date of Birth: 1941-04-30 Referring Provider:  Erick Colace, MD  Encounter Date: 01/05/2015      PT End of Session - 01/05/15 1357    Visit Number 5   Number of Visits 16   Date for PT Re-Evaluation 01/11/15   Authorization Type medicare    Authorization - Visit Number 5   Authorization - Number of Visits 10   PT Start Time 1300   PT Stop Time 1348   PT Time Calculation (min) 48 min   Equipment Utilized During Treatment Gait belt   Activity Tolerance Patient tolerated treatment well   Behavior During Therapy [facility] for tasks assessed/performed      Past Medical History  Diagnosis Date  . Hyperlipidemia   . Small bowel problem     HAD ALOT OF SCAR TISSUE FROM PREVIOUS SURGERIES..NG WAS INSERTED ...Marland KitchenMarland KitchenNO SURGERY NEEDED...Marland KitchenMarland KitchenIN FOR 8 DAYS  . Disorder of blood     BEEN TREATED BY DERMATOLOGIST X 4 YRS..."BLOOD BLISTERS"  . Arthritis   . Gout   . Anxiety   . Hx MRSA infection     rt shoulder  . Pneumonia     "I've had it 3-4 times"  . Coronary artery disease     IN 2000   STENT PLACED IN 2012 sees Dr. Dietrich Pates, saw last 2013  . HTN (hypertension)     sees [name] in [place]  . Small bowel obstruction   . Ventral hernia   . Acute renal failure 04/17/2013  . 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 Anemia, iron deficiency 09/09/2013    Past Surgical History  Procedure Laterality Date  . Sternal surg  2000    HAS HAD 5-6 ON HIS STERNUM; "caught MRSA in it"  . Lumbar laminectomy/decompression microdiscectomy  06/13/2011    Procedure: LUMBAR LAMINECTOMY/DECOMPRESSION MICRODISCECTOMY;  Surgeon: Karn Cassis;  Location: [facility];  Service: Neurosurgery;  Laterality: N/A;  Lumbar three, lumbar four-five Laminectomy  . Eye  surgery      bilateral cataract  . Anterior cervical corpectomy  12/17/11  . Incision and drainage of wound  ~ 20ll; 12/03/11    "had infection in my right"  . Shoulder open rotator cuff repair  ~ 2011    right  . Peripherally inserted central catheter insertion  2011 & 11/2011  . Cataract extraction w/ intraocular lens  implant, bilateral  ? 2011  . Coronary artery bypass graft  2000    CABG X5  . Back surgery      lumbar  . Tonsillectomy  1949  . Cholecystectomy  2006 "or after"  . Coronary angioplasty with stent placement  2012  . Anterior cervical corpectomy  12/17/2011    Procedure: ANTERIOR CERVICAL CORPECTOMY;  Surgeon: Karn Cassis, MD;  Location: [facility];  Service: Neurosurgery;  Laterality: N/A;  Anterior Cervical Decompression Fusion Five to Thoracic Two with plating  . Lumbar laminectomy/decompression microdiscectomy Right 06/17/2013    Procedure: LUMBAR LAMINECTOMY/DECOMPRESSION MICRODISCECTOMY 1 LEVEL;  Surgeon: Karn Cassis, MD;  Location: [facility];  Service: Neurosurgery;  Laterality: Right;  Right L3-4 Microdiskectomy  . Lumbar wound debridement N/A 08/02/2013    Procedure: INCISION AND DRAINAGE OF LUMBAR WOUND DEBRIDEMENT;  Surgeon: Karn Cassis, MD;  Location: [facility];  Service: Neurosurgery;  Laterality: N/A;  . Shoulder arthroscopy Left 09/13/2013    Procedure: ARTHROSCOPIC IRRIGATION AND DEBRIDEMENT, SYNOVECTOMY ,  LEFT SHOULDER ;  Surgeon: Thera Flake., MD;  Location: [facility];  Service: Orthopedics;  Laterality: Left;    There were no vitals filed for this visit.  Visit Diagnosis:  Bilateral leg weakness  Poor balance  Muscle weakness (generalized)  Difficulty walking      Subjective Assessment - 01/05/15 1307    Subjective Pt stated he continues to have prostate pain that is affecting his sleep at night.  Went to [facility] last Wednesday.  Pt entered dept walking with new "Nitro" rollator    Currently in Pain? Yes   Pain Score 6    Pain  Location --  Prostate pain             [facility] Adult PT Treatment/Exercise - 01/05/15 0001    Ambulation/Gait   Ambulation/Gait Yes   Ambulation/Gait Assistance 5: Supervision   Ambulation/Gait Assistance Details cueing for posture and to increase stride length   Ambulation Distance (Feet) 556 Feet   Assistive device Rolling walker   Gait Comments Adjusted new walker height for improve posture   Exercises   Exercises Knee/Hip;Lumbar   Lumbar Exercises: Standing   Row Both;10 reps;Theraband   Theraband Level (Row) Level 3 (Green)   Shoulder Extension Both;10 reps;Theraband   Theraband Level (Shoulder Extension) Level 3 (Green)   Knee/Hip Exercises: Standing   Lateral Step Up Both;10 reps;Hand Hold: 2;Step Height: 4"   Functional Squat 2 sets;10 seconds   Rocker Board 2 minutes   Rocker Board Limitations R/L and A/P   SLS 3x 30" with 1 HHA   Gait Training 556 feet with new walker height adjusted for appropriate posture   Other Standing Knee Exercises Tandem stance 3x 30" bilat with intermittent 1 HHA;    Other Standing Knee Exercises Sidestepping with GTB 4RT inside // bars   Knee/Hip Exercises: Seated   Other Seated Knee Exercises 10 STS 5 reps with each foot behind no HHA from lowest height on blue mat                  PT Short Term Goals - 01/05/15 1402    PT SHORT TERM GOAL #1   Title I HEP   Status On-going   PT SHORT TERM GOAL #2   Title Pt to be able to stand x 2 minutes without UE support to decrease risk of falling    Status On-going   PT SHORT TERM GOAL #3   Title Pt mm strength to improve by once grade to allow pt to walk with walker x 20 mintues    Status On-going   PT SHORT TERM GOAL #4   Title Pain level no greeater than a 3/10    Status On-going           PT Long Term Goals - 01/05/15 1402    PT LONG TERM GOAL #1   Title I in advance HEP   PT LONG TERM GOAL #2   Title Pt to be able to stand for 5 minutes without UE assit to groom self     PT LONG TERM GOAL #3   Title Pt to be able to SLS x 10" to reduce risk of falling   PT LONG TERM GOAL #4   Title Pt to be walking with a cane inside his home  Plan - 01/05/15 1358    Clinical Impression Statement New walker height adjusted to appropraite height to improve posture during gait  Session focus on improving functional strengthening and balance with minimal HHA.  Added lateral step ups for LE functional strengthening, HHA required due to LE weakness and poor balance.  No reports of pain through session, pt was limited by fatigue.     PT Next Visit Plan Continue with current PT POC with exercises including: functional squat, tandem stance, SLS, rockerboard, side step with t-band, marching, sit to stand and lateral stair training.  Begin forward step ups next session and progress to step down when able.          Problem List Patient Active Problem List   Diagnosis Date Noted  . Muscle weakness (generalized) 12/05/2013  . Tight fascia 12/05/2013  . Decreased range of motion of right shoulder 12/05/2013  . Decreased range of motion of left shoulder 12/05/2013  . Bilateral leg weakness 11/21/2013  . Poor balance 11/21/2013  . Difficulty in walking(719.7) 11/21/2013  . Shoulder pain, left 09/12/2013  . Anemia, iron deficiency 09/09/2013  . Dyspnea 09/07/2013  . UTI (urinary tract infection) 09/07/2013  . Acute CHF (congestive heart failure) 09/07/2013  . Elevated troponin 09/07/2013  . Fever and chills 09/06/2013  . Altered mental status 09/05/2013  . Neurogenic bowel 08/22/2013  . Cauda equina syndrome with neurogenic bladder 08/22/2013  . Gout flare 08/22/2013  . Postoperative wound infection 08/22/2013  . Lumbar degenerative disc disease 08/02/2013  . Post-operative pain 07/29/2013  . Osteonecrosis 05/05/2013  . Small bowel obstruction 04/17/2013  . Acute renal failure 04/17/2013  . Medial meniscus, posterior horn derangement 02/17/2013  .  Knee sprain and strain 02/17/2013  . Trigger point with neck pain 03/18/2012  . Rotator cuff tear arthropathy of right shoulder 09/08/2011  . CAD in native artery 01/09/2011  . Ankle fracture 12/25/2010  . Contusion of shoulder, left 12/25/2010  . PEMPHIGUS VULGARIS 07/02/2010  . MRSA 05/13/2010  . PRURITUS 05/13/2010  . SPINAL STENOSIS, LUMBAR 05/13/2010  . HYPERLIPIDEMIA 04/24/2010  . GASTROESOPHAGEAL REFLUX DISEASE 04/24/2010  . VENTRAL HERNIA, INCISIONAL 04/24/2010  . DEGENERATIVE JOINT DISEASE 04/24/2010  . PYOGENIC ARTHRITIS, SHOULDER REGION 02/04/2010  . HYPERTENSION 01/01/2010  . RUPTURE ROTATOR CUFF 02/19/2009   Juel Burrow, PTA  Juel Burrow 01/05/2015, 2:04 PM  [facility] [address] [place], [place], [address] Phone: [phone]   Fax:  [phone]

## 2015-01-09 ENCOUNTER — Other Ambulatory Visit: Payer: Self-pay | Admitting: *Deleted

## 2015-01-09 MED ORDER — ATORVASTATIN CALCIUM 40 MG PO TABS: 40.0000 mg | ORAL_TABLET | Freq: Every day | ORAL | Status: DC

## 2015-01-16 ENCOUNTER — Ambulatory Visit (HOSPITAL_COMMUNITY): Payer: Medicare Other | Admitting: Physical Therapy

## 2015-01-16 DIAGNOSIS — Z9889 Other specified postprocedural states: Secondary | ICD-10-CM | POA: Diagnosis not present

## 2015-01-16 DIAGNOSIS — R262 Difficulty in walking, not elsewhere classified: Secondary | ICD-10-CM | POA: Diagnosis not present

## 2015-01-16 DIAGNOSIS — R29898 Other symptoms and signs involving the musculoskeletal system: Secondary | ICD-10-CM

## 2015-01-16 DIAGNOSIS — M6281 Muscle weakness (generalized): Secondary | ICD-10-CM | POA: Diagnosis not present

## 2015-01-16 DIAGNOSIS — R2689 Other abnormalities of gait and mobility: Secondary | ICD-10-CM

## 2015-01-16 NOTE — Therapy (Signed)
[facility] [address] [place], [place], [address] Phone: [phone]   Fax:  [phone]  Physical Therapy Treatment  Patient Details  Name: [name] MRN: [id number] Date of Birth: 07-13-1941 Referring Provider:  Erick Colace, MD  Encounter Date: 01/16/2015      PT End of Session - 01/16/15 0851    Visit Number 6   Number of Visits 16   Date for PT Re-Evaluation 01/11/15   Authorization Type medicare    Authorization - Visit Number 6   Authorization - Number of Visits 10   PT Start Time 0801   PT Stop Time 0846   PT Time Calculation (min) 45 min   Activity Tolerance Patient tolerated treatment well      Past Medical History  Diagnosis Date  . Hyperlipidemia   . Small bowel problem     HAD ALOT OF SCAR TISSUE FROM PREVIOUS SURGERIES..NG WAS INSERTED ...Marland KitchenMarland KitchenNO SURGERY NEEDED...Marland KitchenMarland KitchenIN FOR 8 DAYS  . Disorder of blood     BEEN TREATED BY DERMATOLOGIST X 4 YRS..."BLOOD BLISTERS"  . Arthritis   . Gout   . Anxiety   . Hx MRSA infection     rt shoulder  . Pneumonia     "I've had it 3-4 times"  . Coronary artery disease     IN 2000   STENT PLACED IN 2012 sees Dr. Dietrich Pates, saw last 2013  . HTN (hypertension)     sees [name] in [place]  . Small bowel obstruction   . Ventral hernia   . Acute renal failure 04/17/2013  . 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 Anemia, iron deficiency 09/09/2013    Past Surgical History  Procedure Laterality Date  . Sternal surg  2000    HAS HAD 5-6 ON HIS STERNUM; "caught MRSA in it"  . Lumbar laminectomy/decompression microdiscectomy  06/13/2011    Procedure: LUMBAR LAMINECTOMY/DECOMPRESSION MICRODISCECTOMY;  Surgeon: Karn Cassis;  [facility]: [facility];  Service: Neurosurgery;  Laterality: N/A;  Lumbar three, lumbar four-five Laminectomy  . Eye surgery      bilateral cataract  . Anterior cervical corpectomy  12/17/11  . Incision and drainage of wound   ~ 20ll; 12/03/11    "had infection in my right"  . Shoulder open rotator cuff repair  ~ 2011    right  . Peripherally inserted central catheter insertion  2011 & 11/2011  . Cataract extraction w/ intraocular lens  implant, bilateral  ? 2011  . Coronary artery bypass graft  2000    CABG X5  . Back surgery      lumbar  . Tonsillectomy  1949  . Cholecystectomy  2006 "or after"  . Coronary angioplasty with stent placement  2012  . Anterior cervical corpectomy  12/17/2011    Procedure: ANTERIOR CERVICAL CORPECTOMY;  Surgeon: Karn Cassis, MD;  Location: [facility];  Service: Neurosurgery;  Laterality: N/A;  Anterior Cervical Decompression Fusion Five to Thoracic Two with plating  . Lumbar laminectomy/decompression microdiscectomy Right 06/17/2013    Procedure: LUMBAR LAMINECTOMY/DECOMPRESSION MICRODISCECTOMY 1 LEVEL;  Surgeon: Karn Cassis, MD;  Location: [facility];  Service: Neurosurgery;  Laterality: Right;  Right L3-4 Microdiskectomy  . Lumbar wound debridement N/A 08/02/2013    Procedure: INCISION AND DRAINAGE OF LUMBAR WOUND DEBRIDEMENT;  Surgeon: Karn Cassis, MD;  Location: [facility];  Service: Neurosurgery;  Laterality: N/A;  . Shoulder arthroscopy Left 09/13/2013  Procedure: ARTHROSCOPIC IRRIGATION AND DEBRIDEMENT, SYNOVECTOMY ,  LEFT SHOULDER ;  Surgeon: Thera Flake., MD;  Location: [facility];  Service: Orthopedics;  Laterality: Left;    There were no vitals filed for this visit.  Visit Diagnosis:  Bilateral leg weakness  Muscle weakness (generalized)  Poor balance  Difficulty walking      Subjective Assessment - 01/16/15 0803    Subjective Pt reports that he feels fine today. He is having some pain in his back.    Currently in Pain? Yes   Pain Score 5    Pain Location Back             [facility] Adult PT Treatment/Exercise - 01/16/15 0001    Ambulation/Gait   Ambulation/Gait Assistance 5: Supervision   Ambulation/Gait Assistance Details cueing for  upright posture   Ambulation Distance (Feet) 226 Feet   Assistive device 4-wheeled walker   Knee/Hip Exercises: Standing   Lateral Step Up Both;10 reps;Hand Hold: 2;Step Height: 4"   Forward Step Up Both;10 reps;Hand Hold: 2;Step Height: 4"   SLS 3x 30" with 2 HHA   Other Standing Knee Exercises Tandem stance 2x 30" bilat with intermittent 1 HHA;    Other Standing Knee Exercises Sidestepping with GTB 5RT inside // bars   Knee/Hip Exercises: Seated   Other Seated Knee/Hip Exercises STS x 15 reps   Knee/Hip Exercises: Supine   Bridges 2 sets;10 reps   Straight Leg Raises Both;2 sets;10 reps                PT Education - 01/16/15 0850    Education provided Yes   Education Details Instruction given for increasing step length with ambulation    Person(s) Educated Patient   Methods Explanation   Comprehension Verbalized understanding          PT Short Term Goals - 01/05/15 1402    PT SHORT TERM GOAL #1   Title I HEP   Status On-going   PT SHORT TERM GOAL #2   Title Pt to be able to stand x 2 minutes without UE support to decrease risk of falling    Status On-going   PT SHORT TERM GOAL #3   Title Pt mm strength to improve by once grade to allow pt to walk with walker x 20 mintues    Status On-going   PT SHORT TERM GOAL #4   Title Pain level no greeater than a 3/10    Status On-going           PT Long Term Goals - 01/05/15 1402    PT LONG TERM GOAL #1   Title I in advance HEP   PT LONG TERM GOAL #2   Title Pt to be able to stand for 5 minutes without UE assit to groom self    PT LONG TERM GOAL #3   Title Pt to be able to SLS x 10" to reduce risk of falling   PT LONG TERM GOAL #4   Title Pt to be walking with a cane inside his home               Plan - 01/16/15 0853    Clinical Impression Statement Pt demonstrates hyperextension of bilateral knees when performing STS, indicating quad weakness bilaterally. He responded well to step ups, but needed  verbal cueing to improve eccentric control. Pt required verbal cueing during sidestepping with green tband to prevent compensation, as well as during ambulation to increase step length.Marland Kitchen  He will benefit from progressing BLE strengthening to prevent compensation during functional tasks.    PT Next Visit Plan Progress quad strengthening to prevent knee hyperextension, continue with forward and lateral step ups, adding step downs when appropriate. Continue with STS, as pt continues to have difficulty completing these confidently.         Problem List Patient Active Problem List   Diagnosis Date Noted  . Muscle weakness (generalized) 12/05/2013  . Tight fascia 12/05/2013  . Decreased range of motion of right shoulder 12/05/2013  . Decreased range of motion of left shoulder 12/05/2013  . Bilateral leg weakness 11/21/2013  . Poor balance 11/21/2013  . Difficulty in walking(719.7) 11/21/2013  . Shoulder pain, left 09/12/2013  . Anemia, iron deficiency 09/09/2013  . Dyspnea 09/07/2013  . UTI (urinary tract infection) 09/07/2013  . Acute CHF (congestive heart failure) 09/07/2013  . Elevated troponin 09/07/2013  . Fever and chills 09/06/2013  . Altered mental status 09/05/2013  . Neurogenic bowel 08/22/2013  . Cauda equina syndrome with neurogenic bladder 08/22/2013  . Gout flare 08/22/2013  . Postoperative wound infection 08/22/2013  . Lumbar degenerative disc disease 08/02/2013  . Post-operative pain 07/29/2013  . Osteonecrosis 05/05/2013  . Small bowel obstruction 04/17/2013  . Acute renal failure 04/17/2013  . Medial meniscus, posterior horn derangement 02/17/2013  . Knee sprain and strain 02/17/2013  . Trigger point with neck pain 03/18/2012  . Rotator cuff tear arthropathy of right shoulder 09/08/2011  . CAD in native artery 01/09/2011  . Ankle fracture 12/25/2010  . Contusion of shoulder, left 12/25/2010  . PEMPHIGUS VULGARIS 07/02/2010  . MRSA 05/13/2010  . PRURITUS  05/13/2010  . SPINAL STENOSIS, LUMBAR 05/13/2010  . HYPERLIPIDEMIA 04/24/2010  . GASTROESOPHAGEAL REFLUX DISEASE 04/24/2010  . VENTRAL HERNIA, INCISIONAL 04/24/2010  . DEGENERATIVE JOINT DISEASE 04/24/2010  . PYOGENIC ARTHRITIS, SHOULDER REGION 02/04/2010  . HYPERTENSION 01/01/2010  . RUPTURE ROTATOR CUFF 02/19/2009    Leona Singleton, PT, DPT [phone] 01/16/2015, 9:01 AM  [facility] [address] [place], [place], [address] Phone: [phone]   Fax:  [phone]

## 2015-01-19 ENCOUNTER — Ambulatory Visit (HOSPITAL_COMMUNITY): Payer: Medicare Other | Admitting: Physical Therapy

## 2015-01-19 DIAGNOSIS — M6281 Muscle weakness (generalized): Secondary | ICD-10-CM | POA: Diagnosis not present

## 2015-01-19 DIAGNOSIS — M25612 Stiffness of left shoulder, not elsewhere classified: Secondary | ICD-10-CM

## 2015-01-19 DIAGNOSIS — R262 Difficulty in walking, not elsewhere classified: Secondary | ICD-10-CM

## 2015-01-19 DIAGNOSIS — R2689 Other abnormalities of gait and mobility: Secondary | ICD-10-CM | POA: Diagnosis not present

## 2015-01-19 DIAGNOSIS — Z9889 Other specified postprocedural states: Secondary | ICD-10-CM | POA: Diagnosis not present

## 2015-01-19 DIAGNOSIS — M25611 Stiffness of right shoulder, not elsewhere classified: Secondary | ICD-10-CM

## 2015-01-19 DIAGNOSIS — M6289 Other specified disorders of muscle: Secondary | ICD-10-CM

## 2015-01-19 DIAGNOSIS — M629 Disorder of muscle, unspecified: Secondary | ICD-10-CM

## 2015-01-19 DIAGNOSIS — R29898 Other symptoms and signs involving the musculoskeletal system: Secondary | ICD-10-CM

## 2015-01-19 NOTE — Therapy (Signed)
[facility] [address] [place], [place], [address] Phone: [phone]   Fax:  [phone]  Physical Therapy Treatment  Patient Details  Name: [name] MRN: [id number] Date of Birth: 11/19/1940 Referring Provider:  [name], MD  Encounter Date: 01/19/2015      PT End of Session - 01/19/15 1015    Visit Number 7   Number of Visits 16   Date for PT Re-Evaluation 01/11/15   Authorization Type medicare    Authorization - Visit Number 7   Authorization - Number of Visits 10   PT Start Time [phone]   PT Stop Time 1019   PT Time Calculation (min) 42 min   Activity Tolerance Patient limited by pain      Past Medical History  Diagnosis Date  . Hyperlipidemia   . Small bowel problem     HAD ALOT OF SCAR TISSUE FROM PREVIOUS SURGERIES..NG WAS INSERTED ...Marland KitchenMarland KitchenNO SURGERY NEEDED...Marland KitchenMarland KitchenIN FOR 8 DAYS  . Disorder of blood     BEEN TREATED BY DERMATOLOGIST X 4 YRS..."BLOOD BLISTERS"  . Arthritis   . Gout   . Anxiety   . Hx MRSA infection     rt shoulder  . Pneumonia     "I've had it 3-4 times"  . Coronary artery disease     IN 2000   STENT PLACED IN 2012 sees Dr. Dietrich Pates, saw last 2013  . HTN (hypertension)     sees [name] in [place]  . Small bowel obstruction   . Ventral hernia   . Acute renal failure 04/17/2013  . 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 Anemia, iron deficiency 09/09/2013    Past Surgical History  Procedure Laterality Date  . Sternal surg  2000    HAS HAD 5-6 ON HIS STERNUM; "caught MRSA in it"  . Lumbar laminectomy/decompression microdiscectomy  06/13/2011    Procedure: LUMBAR LAMINECTOMY/DECOMPRESSION MICRODISCECTOMY;  Surgeon: Karn Cassis;  Location: [facility];  Service: Neurosurgery;  Laterality: N/A;  Lumbar three, lumbar four-five Laminectomy  . Eye surgery      bilateral cataract  . Anterior cervical corpectomy  12/17/11  . Incision and drainage of wound  ~ 20ll;  12/03/11    "had infection in my right"  . Shoulder open rotator cuff repair  ~ 2011    right  . Peripherally inserted central catheter insertion  2011 & 11/2011  . Cataract extraction w/ intraocular lens  implant, bilateral  ? 2011  . Coronary artery bypass graft  2000    CABG X5  . Back surgery      lumbar  . Tonsillectomy  1949  . Cholecystectomy  2006 "or after"  . Coronary angioplasty with stent placement  2012  . Anterior cervical corpectomy  12/17/2011    Procedure: ANTERIOR CERVICAL CORPECTOMY;  Surgeon: Karn Cassis, MD;  Location: [facility];  Service: Neurosurgery;  Laterality: N/A;  Anterior Cervical Decompression Fusion Five to Thoracic Two with plating  . Lumbar laminectomy/decompression microdiscectomy Right 06/17/2013    Procedure: LUMBAR LAMINECTOMY/DECOMPRESSION MICRODISCECTOMY 1 LEVEL;  Surgeon: Karn Cassis, MD;  Location: [facility];  Service: Neurosurgery;  Laterality: Right;  Right L3-4 Microdiskectomy  . Lumbar wound debridement N/A 08/02/2013    Procedure: INCISION AND DRAINAGE OF LUMBAR WOUND DEBRIDEMENT;  Surgeon: Karn Cassis, MD;  Location: [facility];  Service: Neurosurgery;  Laterality: N/A;  . Shoulder arthroscopy Left 09/13/2013  Procedure: ARTHROSCOPIC IRRIGATION AND DEBRIDEMENT, SYNOVECTOMY ,  LEFT SHOULDER ;  Surgeon: Thera Flake., MD;  Location: [facility];  Service: Orthopedics;  Laterality: Left;    There were no vitals filed for this visit.  Visit Diagnosis:  Bilateral leg weakness  Muscle weakness (generalized)  Poor balance  Difficulty walking  Tight fascia  Decreased range of motion of right shoulder  Decreased range of motion of left shoulder      Subjective Assessment - 01/19/15 0937    Subjective I'm doing quite a bit everyday.  My balance is my biggest problem   Currently in Pain? No/denies   Pain Score 6    Pain Location Back              [facility] Adult PT Treatment/Exercise - 01/19/15 0001    Lumbar  Exercises: Standing   Other Standing Lumbar Exercises B hip ab/ extension x 10; standing no hand hold pull into extension x 5; wt shift no hands Rt/LT and anterior posterior.    Knee/Hip Exercises: Standing   Lateral Step Up Both;10 reps;Hand Hold: 2;Step Height: 4"   Forward Step Up Both;10 reps;Hand Hold: 2;Step Height: 4"   Functional Squat 10 reps   Other Standing Knee Exercises Tandem stance 2x 30" bilat with intermittent 1 HHA;    Other Standing Knee Exercises sidestepping at mat; marrching x 3 pt unable to lift Rt LE due to pain.    Knee/Hip Exercises: Seated   Other Seated Knee/Hip Exercises STS 5 x each leg                 PT Education - 01/19/15 1014    Education provided Yes   Education Details keep shoullders over hips during exercises   Person(s) Educated Patient   Methods Explanation   Comprehension Verbalized understanding          PT Short Term Goals - 01/05/15 1402    PT SHORT TERM GOAL #1   Title I HEP   Status On-going   PT SHORT TERM GOAL #2   Title Pt to be able to stand x 2 minutes without UE support to decrease risk of falling    Status On-going   PT SHORT TERM GOAL #3   Title Pt mm strength to improve by once grade to allow pt to walk with walker x 20 mintues    Status On-going   PT SHORT TERM GOAL #4   Title Pain level no greeater than a 3/10    Status On-going           PT Long Term Goals - 01/05/15 1402    PT LONG TERM GOAL #1   Title I in advance HEP   PT LONG TERM GOAL #2   Title Pt to be able to stand for 5 minutes without UE assit to groom self    PT LONG TERM GOAL #3   Title Pt to be able to SLS x 10" to reduce risk of falling   PT LONG TERM GOAL #4   Title Pt to be walking with a cane inside his home               Plan - 01/19/15 1015    Clinical Impression Statement Pt complains of pain in Lt Lower back which he contributes to his prostrate.  Pain is especally severe with use of his Lt LE>  Added more balance  activities.  Pt unable to balance without mod assist.  PT Next Visit Plan Focus on balance and strengthening         Problem List Patient Active Problem List   Diagnosis Date Noted  . Muscle weakness (generalized) 12/05/2013  . Tight fascia 12/05/2013  . Decreased range of motion of right shoulder 12/05/2013  . Decreased range of motion of left shoulder 12/05/2013  . Bilateral leg weakness 11/21/2013  . Poor balance 11/21/2013  . Difficulty in walking(719.7) 11/21/2013  . Shoulder pain, left 09/12/2013  . Anemia, iron deficiency 09/09/2013  . Dyspnea 09/07/2013  . UTI (urinary tract infection) 09/07/2013  . Acute CHF (congestive heart failure) 09/07/2013  . Elevated troponin 09/07/2013  . Fever and chills 09/06/2013  . Altered mental status 09/05/2013  . Neurogenic bowel 08/22/2013  . Cauda equina syndrome with neurogenic bladder 08/22/2013  . Gout flare 08/22/2013  . Postoperative wound infection 08/22/2013  . Lumbar degenerative disc disease 08/02/2013  . Post-operative pain 07/29/2013  . Osteonecrosis 05/05/2013  . Small bowel obstruction 04/17/2013  . Acute renal failure 04/17/2013  . Medial meniscus, posterior horn derangement 02/17/2013  . Knee sprain and strain 02/17/2013  . Trigger point with neck pain 03/18/2012  . Rotator cuff tear arthropathy of right shoulder 09/08/2011  . CAD in native artery 01/09/2011  . Ankle fracture 12/25/2010  . Contusion of shoulder, left 12/25/2010  . PEMPHIGUS VULGARIS 07/02/2010  . MRSA 05/13/2010  . PRURITUS 05/13/2010  . SPINAL STENOSIS, LUMBAR 05/13/2010  . HYPERLIPIDEMIA 04/24/2010  . GASTROESOPHAGEAL REFLUX DISEASE 04/24/2010  . VENTRAL HERNIA, INCISIONAL 04/24/2010  . DEGENERATIVE JOINT DISEASE 04/24/2010  . PYOGENIC ARTHRITIS, SHOULDER REGION 02/04/2010  . HYPERTENSION 01/01/2010  . RUPTURE ROTATOR CUFF 02/19/2009    Virgina Organ, PT CLT [phone] 01/19/2015, 10:21 AM  [facility] [address] [place], [place], [address] Phone: [phone]   Fax:  [phone]

## 2015-01-22 ENCOUNTER — Other Ambulatory Visit: Payer: Self-pay | Admitting: *Deleted

## 2015-01-23 ENCOUNTER — Encounter (HOSPITAL_COMMUNITY): Payer: Medicare Other | Admitting: Physical Therapy

## 2015-01-24 DIAGNOSIS — R351 Nocturia: Secondary | ICD-10-CM | POA: Diagnosis not present

## 2015-01-24 DIAGNOSIS — N401 Enlarged prostate with lower urinary tract symptoms: Secondary | ICD-10-CM | POA: Diagnosis not present

## 2015-01-24 DIAGNOSIS — R3915 Urgency of urination: Secondary | ICD-10-CM | POA: Diagnosis not present

## 2015-01-24 DIAGNOSIS — N390 Urinary tract infection, site not specified: Secondary | ICD-10-CM | POA: Diagnosis not present

## 2015-01-24 DIAGNOSIS — B952 Enterococcus as the cause of diseases classified elsewhere: Secondary | ICD-10-CM | POA: Diagnosis not present

## 2015-01-24 DIAGNOSIS — R339 Retention of urine, unspecified: Secondary | ICD-10-CM | POA: Diagnosis not present

## 2015-01-25 ENCOUNTER — Encounter (HOSPITAL_COMMUNITY): Payer: Medicare Other | Admitting: Physical Therapy

## 2015-01-31 ENCOUNTER — Ambulatory Visit (HOSPITAL_COMMUNITY): Payer: Medicare Other | Attending: Orthopedic Surgery | Admitting: Physical Therapy

## 2015-01-31 DIAGNOSIS — R2689 Other abnormalities of gait and mobility: Secondary | ICD-10-CM | POA: Diagnosis not present

## 2015-01-31 DIAGNOSIS — M7582 Other shoulder lesions, left shoulder: Secondary | ICD-10-CM | POA: Diagnosis not present

## 2015-01-31 DIAGNOSIS — M6281 Muscle weakness (generalized): Secondary | ICD-10-CM | POA: Diagnosis not present

## 2015-01-31 DIAGNOSIS — M629 Disorder of muscle, unspecified: Secondary | ICD-10-CM | POA: Diagnosis not present

## 2015-01-31 DIAGNOSIS — M6289 Other specified disorders of muscle: Secondary | ICD-10-CM

## 2015-01-31 DIAGNOSIS — G834 Cauda equina syndrome: Secondary | ICD-10-CM | POA: Diagnosis not present

## 2015-01-31 DIAGNOSIS — R262 Difficulty in walking, not elsewhere classified: Secondary | ICD-10-CM | POA: Diagnosis not present

## 2015-01-31 DIAGNOSIS — M25511 Pain in right shoulder: Secondary | ICD-10-CM | POA: Insufficient documentation

## 2015-01-31 DIAGNOSIS — M25611 Stiffness of right shoulder, not elsewhere classified: Secondary | ICD-10-CM

## 2015-01-31 DIAGNOSIS — M25612 Stiffness of left shoulder, not elsewhere classified: Secondary | ICD-10-CM

## 2015-01-31 DIAGNOSIS — R29898 Other symptoms and signs involving the musculoskeletal system: Secondary | ICD-10-CM | POA: Diagnosis not present

## 2015-01-31 NOTE — Therapy (Signed)
[facility] [address] [place], [place], [address] Phone: [phone]   Fax:  [phone]  Physical Therapy Treatment  Patient Details  Name: [name] MRN: [id number] Date of Birth: 07/09/1941 Referring Provider:  Erick ColaceErickson, Melissa M, MD  Encounter Date: 01/31/2015      PT End of Session - 01/31/15 1054    Visit Number 8   Number of Visits 16   Date for PT Re-Evaluation 01/11/15   Authorization Type medicare; cert until 6/967/17   Authorization - Visit Number 8   Authorization - Number of Visits 10   PT Start Time 0934   PT Stop Time 1025   PT Time Calculation (min) 51 min   Activity Tolerance Patient limited by fatigue;Patient tolerated treatment well   Behavior [facility] for tasks assessed/performed      Past Medical History  Diagnosis Date  . Hyperlipidemia   . Small bowel problem     HAD ALOT OF SCAR TISSUE FROM PREVIOUS SURGERIES..NG WAS INSERTED ...Marland Kitchen.Marland Kitchen.NO SURGERY NEEDED...Marland Kitchen.Marland Kitchen.IN FOR 8 DAYS  . Disorder of blood     BEEN TREATED BY DERMATOLOGIST X 4 YRS..."BLOOD BLISTERS"  . Arthritis   . Gout   . Anxiety   . Hx MRSA infection     rt shoulder  . Pneumonia     "I've had it 3-4 times"  . Coronary artery disease     IN 2000   STENT PLACED IN 2012 sees Dr. Dietrich Patesothbart, saw last 2013  . HTN (hypertension)     [place]  . Small bowel obstruction   . Ventral hernia   . Acute renal failure 04/17/2013  . 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 Anemia, iron deficiency 09/09/2013    Past Surgical History  Procedure Laterality Date  . Sternal surg  2000    HAS HAD 5-6 ON HIS STERNUM; "caught MRSA in it"  . Lumbar laminectomy/decompression microdiscectomy  06/13/2011    Procedure: LUMBAR LAMINECTOMY/DECOMPRESSION MICRODISCECTOMY;  Surgeon: Karn CassisErnesto M Botero;  Location: [facility];  Service: Neurosurgery;  Laterality: N/A;  Lumbar three, lumbar four-five Laminectomy  . Eye surgery       bilateral cataract  . Anterior cervical corpectomy  12/17/11  . Incision and drainage of wound  ~ 20ll; 12/03/11    "had infection in my right"  . Shoulder open rotator cuff repair  ~ 2011    right  . Peripherally inserted central catheter insertion  2011 & 11/2011  . Cataract extraction w/ intraocular lens  implant, bilateral  ? 2011  . Coronary artery bypass graft  2000    CABG X5  . Back surgery      lumbar  . Tonsillectomy  1949  . Cholecystectomy  2006 "or after"  . Coronary angioplasty with stent placement  2012  . Anterior cervical corpectomy  12/17/2011    Procedure: ANTERIOR CERVICAL CORPECTOMY;  Surgeon: Karn CassisErnesto M Botero, MD;  Location: [facility];  Service: Neurosurgery;  Laterality: N/A;  Anterior Cervical Decompression Fusion Five to Thoracic Two with plating  . Lumbar laminectomy/decompression microdiscectomy Right 06/17/2013    Procedure: LUMBAR LAMINECTOMY/DECOMPRESSION MICRODISCECTOMY 1 LEVEL;  Surgeon: Karn CassisErnesto M Botero, MD;  Location: [facility];  Service: Neurosurgery;  Laterality: Right;  Right L3-4 Microdiskectomy  . Lumbar wound debridement N/A 08/02/2013    Procedure: INCISION AND DRAINAGE OF LUMBAR WOUND DEBRIDEMENT;  Surgeon: Karn CassisErnesto M Botero, MD;  [facility]: [facility];  Service: Neurosurgery;  Laterality: N/A;  . Shoulder arthroscopy Left 09/13/2013    Procedure: ARTHROSCOPIC IRRIGATION AND DEBRIDEMENT, SYNOVECTOMY ,  LEFT SHOULDER ;  Surgeon: Thera Flake., MD;  Location: [facility];  Service: Orthopedics;  Laterality: Left;    There were no vitals filed for this visit.  Visit Diagnosis:  Bilateral leg weakness  Muscle weakness (generalized)  Poor balance  Difficulty walking  Tight fascia  Decreased range of motion of right shoulder  Decreased range of motion of left shoulder  Cauda equina syndrome      Subjective Assessment - 01/31/15 1058    Subjective Pt states he can tell he's getting stronger.  Reports he is having no pain.   Currently  in Pain? No/denies                         [facility] Adult PT Treatment/Exercise - 01/31/15 0938    Lumbar Exercises: Standing   Other Standing Lumbar Exercises B hip ab/ extension x 15;wt shift Rt/LT and anterior posterior.    Knee/Hip Exercises: Aerobic   Elliptical nustep 8 minutes hills #2, level 2 LE only   Knee/Hip Exercises: Standing   Lateral Step Up Both;15 reps;Hand Hold: 2;Step Height: 4"   Forward Step Up Both;15 reps;Hand Hold: 2;Step Height: 4"   Functional Squat 15 reps   SLS 3x 30" Lt, 2X60" Rt with 1 HHA   Other Standing Knee Exercises Tandem stance 2x 30" bilat with intermittent 1 HHA;    Other Standing Knee Exercises sidestepping on line with therapist HHA 2RT, marrching x 10 high holds 3" with bilateral UE's                  PT Short Term Goals - 01/05/15 1402    PT SHORT TERM GOAL #1   Title I HEP   Status On-going   PT SHORT TERM GOAL #2   Title Pt to be able to stand x 2 minutes without UE support to decrease risk of falling    Status On-going   PT SHORT TERM GOAL #3   Title Pt mm strength to improve by once grade to allow pt to walk with walker x 20 mintues    Status On-going   PT SHORT TERM GOAL #4   Title Pain level no greeater than a 3/10    Status On-going           PT Long Term Goals - 01/05/15 1402    PT LONG TERM GOAL #1   Title I in advance HEP   PT LONG TERM GOAL #2   Title Pt to be able to stand for 5 minutes without UE assit to groom self    PT LONG TERM GOAL #3   Title Pt to be able to SLS x 10" to reduce risk of falling   PT LONG TERM GOAL #4   Title Pt to be walking with a cane inside his home               Plan - 01/31/15 1056    Clinical Impression Statement No complaints of pain today and eager to complete exericses.  Pt only needed 2 seated rest breaks during session today.  Progressed to side stepping on the line with therapist HHA.  Pt wtih more difficulty goint to Rt than LT.  Added nustep to  further help increase LE strength at EOS.    PT Next Visit Plan Focus  on balance and strengthening progressing as able.         Problem List Patient Active Problem List   Diagnosis Date Noted  . Muscle weakness (generalized) 12/05/2013  . Tight fascia 12/05/2013  . Decreased range of motion of right shoulder 12/05/2013  . Decreased range of motion of left shoulder 12/05/2013  . Bilateral leg weakness 11/21/2013  . Poor balance 11/21/2013  . Difficulty in walking(719.7) 11/21/2013  . Shoulder pain, left 09/12/2013  . Anemia, iron deficiency 09/09/2013  . Dyspnea 09/07/2013  . UTI (urinary tract infection) 09/07/2013  . Acute CHF (congestive heart failure) 09/07/2013  . Elevated troponin 09/07/2013  . Fever and chills 09/06/2013  . Altered mental status 09/05/2013  . Neurogenic bowel 08/22/2013  . Cauda equina syndrome with neurogenic bladder 08/22/2013  . Gout flare 08/22/2013  . Postoperative wound infection 08/22/2013  . Lumbar degenerative disc disease 08/02/2013  . Post-operative pain 07/29/2013  . Osteonecrosis 05/05/2013  . Small bowel obstruction 04/17/2013  . Acute renal failure 04/17/2013  . Medial meniscus, posterior horn derangement 02/17/2013  . Knee sprain and strain 02/17/2013  . Trigger point with neck pain 03/18/2012  . Rotator cuff tear arthropathy of right shoulder 09/08/2011  . CAD in native artery 01/09/2011  . Ankle fracture 12/25/2010  . Contusion of shoulder, left 12/25/2010  . PEMPHIGUS VULGARIS 07/02/2010  . MRSA 05/13/2010  . PRURITUS 05/13/2010  . SPINAL STENOSIS, LUMBAR 05/13/2010  . HYPERLIPIDEMIA 04/24/2010  . GASTROESOPHAGEAL REFLUX DISEASE 04/24/2010  . VENTRAL HERNIA, INCISIONAL 04/24/2010  . DEGENERATIVE JOINT DISEASE 04/24/2010  . PYOGENIC ARTHRITIS, SHOULDER REGION 02/04/2010  . HYPERTENSION 01/01/2010  . RUPTURE ROTATOR CUFF 02/19/2009    Lurena Nida, PTA/CLT [phone]  01/31/2015, 10:58 AM  [facility] [address] [place], [place], [address] Phone: [phone]   Fax:  [phone]

## 2015-02-05 ENCOUNTER — Ambulatory Visit (HOSPITAL_COMMUNITY): Payer: Medicare Other | Admitting: Physical Therapy

## 2015-02-05 DIAGNOSIS — M629 Disorder of muscle, unspecified: Secondary | ICD-10-CM | POA: Diagnosis not present

## 2015-02-05 DIAGNOSIS — M6281 Muscle weakness (generalized): Secondary | ICD-10-CM

## 2015-02-05 DIAGNOSIS — R2689 Other abnormalities of gait and mobility: Secondary | ICD-10-CM | POA: Diagnosis not present

## 2015-02-05 DIAGNOSIS — M25511 Pain in right shoulder: Secondary | ICD-10-CM | POA: Diagnosis not present

## 2015-02-05 DIAGNOSIS — R262 Difficulty in walking, not elsewhere classified: Secondary | ICD-10-CM | POA: Diagnosis not present

## 2015-02-05 DIAGNOSIS — R29898 Other symptoms and signs involving the musculoskeletal system: Secondary | ICD-10-CM

## 2015-02-05 NOTE — Therapy (Signed)
[name] [address] [place], [place], [address] Phone: [phone]   Fax:  [phone]  Physical Therapy Treatment (Re-Assessment)  Patient Details  Name: Jake MarvelRonald W [name] MRN: [id number] Date of Birth: 04/14/1941 Referring Provider:  Kari BaarsHawkins, Edward, MD  Encounter Date: 02/05/2015      PT End of Session - 02/05/15 1339    Visit Number 9   Number of Visits 20   Date for PT Re-Evaluation 03/05/15   Authorization Type medicare; recert done 7/11   Authorization - Visit Number 9   Authorization - Number of Visits 19   PT Start Time 1301   PT Stop Time 1343   PT Time Calculation (min) 42 min   Activity Tolerance Patient tolerated treatment well;Patient limited by fatigue   Behavior During Therapy [name] for tasks assessed/performed      Past Medical History  Diagnosis Date  . Hyperlipidemia   . Small bowel problem     HAD ALOT OF SCAR TISSUE FROM PREVIOUS SURGERIES..NG WAS INSERTED ...[name].[name].NO SURGERY NEEDED...[name].[name].IN FOR 8 DAYS  . Disorder of blood     BEEN TREATED BY DERMATOLOGIST X 4 YRS..."BLOOD BLISTERS"  . Arthritis   . Gout   . Anxiety   . Hx MRSA infection     rt shoulder  . Pneumonia     "I've had it 3-4 times"  . Coronary artery disease     IN 2000   STENT PLACED IN 2012 sees Dr. Dietrich Patesothbart, saw last 2013  . HTN (hypertension)     [place]  . Small bowel obstruction   . Ventral hernia   . Acute renal failure 04/17/2013  . 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 Anemia, iron deficiency 09/09/2013    Past Surgical History  Procedure Laterality Date  . Sternal surg  2000    HAS HAD 5-6 ON HIS STERNUM; "caught MRSA in it"  . Lumbar laminectomy/decompression microdiscectomy  06/13/2011    Procedure: LUMBAR LAMINECTOMY/DECOMPRESSION MICRODISCECTOMY;  Surgeon: [name];  [facility]: [facility];  Service: Neurosurgery;  Laterality: N/A;  Lumbar three, lumbar four-five Laminectomy   . Eye surgery      bilateral cataract  . Anterior cervical corpectomy  12/17/11  . Incision and drainage of wound  ~ 20ll; 12/03/11    "had infection in my right"  . Shoulder open rotator cuff repair  ~ 2011    right  . Peripherally inserted central catheter insertion  2011 & 11/2011  . Cataract extraction w/ intraocular lens  implant, bilateral  ? 2011  . Coronary artery bypass graft  2000    CABG X5  . Back surgery      lumbar  . Tonsillectomy  1949  . Cholecystectomy  2006 "or after"  . Coronary angioplasty with stent placement  2012  . Anterior cervical corpectomy  12/17/2011    Procedure: ANTERIOR CERVICAL CORPECTOMY;  Surgeon: Jake CassisErnesto M Botero, MD;  Location: [facility];  Service: Neurosurgery;  Laterality: N/A;  Anterior Cervical Decompression Fusion Five to Thoracic Two with plating  . Lumbar laminectomy/decompression microdiscectomy Right 06/17/2013    Procedure: LUMBAR LAMINECTOMY/DECOMPRESSION MICRODISCECTOMY 1 LEVEL;  Surgeon: Jake CassisErnesto M Botero, MD;  [facility]: [facility];  Service: Neurosurgery;  Laterality: Right;  Right L3-4 Microdiskectomy  . Lumbar wound debridement N/A 08/02/2013    Procedure: INCISION AND DRAINAGE OF LUMBAR WOUND DEBRIDEMENT;  Surgeon: Jake CassisErnesto M Botero, MD;  [facility]: [facility];  Service: Neurosurgery;  Laterality: N/A;  . Shoulder arthroscopy Left 09/13/2013    Procedure: ARTHROSCOPIC IRRIGATION AND DEBRIDEMENT, SYNOVECTOMY ,  LEFT SHOULDER ;  Surgeon: [name]., MD;  Location: [facility] OR;  Service: Orthopedics;  Laterality: Left;    There were no vitals filed for this visit.  Visit Diagnosis:  Bilateral leg weakness - Plan: PT plan of care cert/re-cert  Muscle weakness (generalized) - Plan: PT plan of care cert/re-cert  Poor balance - Plan: PT plan of care cert/re-cert  Difficulty walking - Plan: PT plan of care cert/re-cert      Subjective Assessment - 02/05/15 1306    Subjective Patient reports that he has returned to his workout at home,  mostly working on his legs and arms. This was his first time getting back to his workout, and he expects he will be sore tomorrow.    Pertinent History [name]  was hospitalized for back surgery 11/2012 with relief of pain but  developed Lt drop foot. He had a second surgery in November for his right side. This surgery was suppose to be in overnight but due to complications he ended up in the hospital for several days he went IP rehab and went home right before Christmas. He then had a flare infection in January and was admitted for an UTI and a Lt rotator cuff surgery. He then went to the [facility]. He stayed at the [facility] for 9 weeks and then went home on 11/18/2013.  He recieved extensive OP surgery after his discharge.  B RTC    How long can you sit comfortably? no problem    How long can you stand comfortably? Patient not sure but states that he is getting better overall    How long can you walk comfortably? Patient not sure but states he is getting better overall    Currently in Pain? Yes   Pain Score 5    Pain Location Back            [facility] PT Assessment - 02/05/15 0001    Assessment   Medical Diagnosis Lumbar fusion    Onset Date/Surgical Date 10/19/14   Next MD Visit September with back surgeon    Precautions   Precautions Fall   Required Braces or Orthoses Other Brace/Splint   Other Brace/Splint brace secondary to not having a sternum; Lt AFO    Home Environment   Living Environment Private residence   Living Arrangements Spouse/significant other   Type of Home House   Home Access Ramped entrance   Prior Function   Level of Independence Independent with basic ADLs   Vocation Retired   Leisure none; pt is going to start coaching basketball again    Observation/Other Assessments   Focus on Therapeutic Outcomes (FOTO)  46% limited    Functional Tests   Functional tests Sit to Stand   Sit to Stand   Comments 10 in 30 seconds with hands; 4 in 30 seconds  without using hands    Strength   Right Hip Flexion 5/5   Right Hip Extension 2+/5   Right Hip ABduction 3-/5   Left Hip Flexion 5/5   Left Hip Extension 2-/5   Left Hip ABduction 2-/5   Right Knee Flexion 2-/5   Right Knee Extension 5/5   Left Knee Flexion 2+/5   Left Knee Extension 5/5   Right Ankle Dorsiflexion 4+/5   Left Ankle Dorsiflexion 1/5   6 Minute Walk- Baseline  6 Minute Walk- Baseline no   6 Minute walk- Post Test   6 Minute Walk Post Test no   6 minute walk test results    Aerobic Endurance Distance Walked 450   Endurance additional comments 0.70m/s                     [facility] Adult PT Treatment/Exercise - 02/05/15 0001    Knee/Hip Exercises: Aerobic   Elliptical Nustep 8 minutes level 2                 PT Education - 02/05/15 1339    Education provided Yes   Education Details plan of care moving forward, progress with skilled PT services    Person(s) Educated Patient   Methods Explanation   Comprehension Verbalized understanding          PT Short Term Goals - 02/05/15 1328    PT SHORT TERM GOAL #1   Title I HEP   Baseline 7/11- patient reports he is not doing them every day but has started working out again    Time 1   Period Weeks   Status On-going   PT SHORT TERM GOAL #2   Title Pt to be able to stand x 2 minutes without UE support to decrease risk of falling    Baseline 7/11- requires UE support to stand    Time 4   Period Weeks   Status On-going   PT SHORT TERM GOAL #3   Title Pt mm strength to improve by once grade to allow pt to walk with walker x 20 mintues    Time 4   Period Weeks   Status On-going   PT SHORT TERM GOAL #4   Title Pain level no greeater than a 3/10    Baseline 7/11- patient states pain still getting around 5/10   Time 4   Period Weeks   Status On-going           PT Long Term Goals - 02/05/15 1331    PT LONG TERM GOAL #1   Title I in advance HEP   Time 6   Period Weeks   Status  On-going   PT LONG TERM GOAL #2   Title Pt to be able to stand for 5 minutes without UE assit to groom self    Time 6   Period Weeks   Status On-going   PT LONG TERM GOAL #3   Title Pt to be able to SLS x 10" to reduce risk of falling   Time 6   Period Weeks   Status On-going   PT LONG TERM GOAL #4   Title Pt to be walking with a cane inside his home   Time 6   Period Weeks   Status On-going               Plan - 02/05/15 1340    Clinical Impression Statement Re-assessment performed today. Patient is improving but continues to demonstrate significant weakness in bilateral LEs and core, postural and gait impairments, reduced balance, impaired functional activity tolerance, and reduced functional task performance skills. Patient did state during session that balance is a main concern of his. Patient will benefit from a continuation of skilled PT services, at 2x/week for at least 4 more weeks, in order to address his deficits and assist him in reaching an optimal level of function.    Pt will benefit from skilled therapeutic intervention in order to improve on  the following deficits Abnormal gait;Decreased activity tolerance;Decreased balance;Difficulty walking;Impaired perceived functional ability;Decreased strength   Rehab Potential Good   PT Frequency 2x / week   PT Treatment/Interventions Therapeutic exercise;Balance training;Therapeutic activities;Functional mobility training;Stair training;Gait training;Patient/family education   PT Next Visit Plan Focus on balance and strengthening progressing as able.    PT Home Exercise Plan No changes; encouraged pt to remain consistent with HEP.    Consulted and Agree with Plan of Care Patient          G-Codes - February 18, 2015 1343    Functional Assessment Tool Used FOTO 46% limited    Functional Limitation Mobility: Walking and moving around   Mobility: Walking and Moving Around Current Status [phone]) At least 40 percent but less than 60  percent impaired, limited or restricted   Mobility: Walking and Moving Around Goal Status [phone]) At least 20 percent but less than 40 percent impaired, limited or restricted      Problem List Patient Active Problem List   Diagnosis Date Noted  . Muscle weakness (generalized) 12/05/2013  . Tight fascia 12/05/2013  . Decreased range of motion of right shoulder 12/05/2013  . Decreased range of motion of left shoulder 12/05/2013  . Bilateral leg weakness 11/21/2013  . Poor balance 11/21/2013  . Difficulty in walking(719.7) 11/21/2013  . Shoulder pain, left 09/12/2013  . Anemia, iron deficiency 09/09/2013  . Dyspnea 09/07/2013  . UTI (urinary tract infection) 09/07/2013  . Acute CHF (congestive heart failure) 09/07/2013  . Elevated troponin 09/07/2013  . Fever and chills 09/06/2013  . Altered mental status 09/05/2013  . Neurogenic bowel 08/22/2013  . Cauda equina syndrome with neurogenic bladder 08/22/2013  . Gout flare 08/22/2013  . Postoperative wound infection 08/22/2013  . Lumbar degenerative disc disease 08/02/2013  . Post-operative pain 07/29/2013  . Osteonecrosis 05/05/2013  . Small bowel obstruction 04/17/2013  . Acute renal failure 04/17/2013  . Medial meniscus, posterior horn derangement 02/17/2013  . Knee sprain and strain 02/17/2013  . Trigger point with neck pain 03/18/2012  . Rotator cuff tear arthropathy of right shoulder 09/08/2011  . CAD in native artery 01/09/2011  . Ankle fracture 12/25/2010  . Contusion of shoulder, left 12/25/2010  . PEMPHIGUS VULGARIS 07/02/2010  . MRSA 05/13/2010  . PRURITUS 05/13/2010  . SPINAL STENOSIS, LUMBAR 05/13/2010  . HYPERLIPIDEMIA 04/24/2010  . GASTROESOPHAGEAL REFLUX DISEASE 04/24/2010  . VENTRAL HERNIA, INCISIONAL 04/24/2010  . DEGENERATIVE JOINT DISEASE 04/24/2010  . PYOGENIC ARTHRITIS, SHOULDER REGION February 17, 2010  . HYPERTENSION 01/01/2010  . RUPTURE ROTATOR CUFF 02/19/2009     Physical Therapy Progress  Note  Dates of Reporting Period: 12/12/14  to 2005/02/17  Objective Reports of Subjective Statement: see above   Objective Measurements: see above   Goal Update: see above   Plan: see above   Reason Skilled Services are Required: generalized weakness, impaired functional activity tolerance, reduced balance, and gait/postural impairments    Nedra Hai PT, DPT [phone]  [facility] [address] [place], [place], [address] Phone: [phone]   Fax:  [phone]

## 2015-02-08 ENCOUNTER — Ambulatory Visit (HOSPITAL_COMMUNITY): Payer: Medicare Other

## 2015-02-08 DIAGNOSIS — M6281 Muscle weakness (generalized): Secondary | ICD-10-CM | POA: Diagnosis not present

## 2015-02-08 DIAGNOSIS — R262 Difficulty in walking, not elsewhere classified: Secondary | ICD-10-CM | POA: Diagnosis not present

## 2015-02-08 DIAGNOSIS — R29898 Other symptoms and signs involving the musculoskeletal system: Secondary | ICD-10-CM | POA: Diagnosis not present

## 2015-02-08 DIAGNOSIS — M25511 Pain in right shoulder: Secondary | ICD-10-CM | POA: Diagnosis not present

## 2015-02-08 DIAGNOSIS — M629 Disorder of muscle, unspecified: Secondary | ICD-10-CM | POA: Diagnosis not present

## 2015-02-08 DIAGNOSIS — R2689 Other abnormalities of gait and mobility: Secondary | ICD-10-CM | POA: Diagnosis not present

## 2015-02-08 NOTE — Therapy (Signed)
[facility] [address] [place], [place], [address] Phone: [phone]   Fax:  [phone]  Physical Therapy Treatment  Patient Details  Name: [name] MRN: [id number] Date of Birth: [birth year] Referring Provider:  Erick Colace, MD  Encounter Date: 02/08/2015      PT End of Session - 02/08/15 1657    Visit Number 10   Number of Visits 20   Date for PT Re-Evaluation 03/05/15   Authorization Type medicare; recert done 7/11   Authorization Time Period Gcode complete 9th session   Authorization - Visit Number 10   Authorization - Number of Visits 19   PT Start Time 1450   PT Stop Time 1535   PT Time Calculation (min) 45 min   Equipment Utilized During Treatment Gait belt   Activity Tolerance Patient tolerated treatment well;Patient limited by fatigue   Behavior During Therapy [facility] for tasks assessed/performed      Past Medical History  Diagnosis Date  . Hyperlipidemia   . Small bowel problem     HAD ALOT OF SCAR TISSUE FROM PREVIOUS SURGERIES..NG WAS INSERTED ...Marland KitchenMarland KitchenNO SURGERY NEEDED...Marland KitchenMarland KitchenIN FOR 8 DAYS  . Disorder of blood     BEEN TREATED BY DERMATOLOGIST X 4 YRS..."BLOOD BLISTERS"  . Arthritis   . Gout   . Anxiety   . Hx MRSA infection     rt shoulder  . Pneumonia     "I've had it 3-4 times"  . Coronary artery disease     IN 2000   STENT PLACED IN 2012 sees Dr. Dietrich Pates, saw last 2013  . HTN (hypertension)     sees [name] in [place]  . Small bowel obstruction   . Ventral hernia   . Acute renal failure 04/17/2013  . 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 Anemia, iron deficiency 09/09/2013    Past Surgical History  Procedure Laterality Date  . Sternal surg  2000    HAS HAD 5-6 ON HIS STERNUM; "caught MRSA in it"  . Lumbar laminectomy/decompression microdiscectomy  06/13/2011    Procedure: LUMBAR LAMINECTOMY/DECOMPRESSION MICRODISCECTOMY;  Surgeon: Karn Cassis;  [facility]: [facility];  Service: Neurosurgery;  Laterality: N/A;  Lumbar three, lumbar four-five Laminectomy  . Eye surgery      bilateral cataract  . Anterior cervical corpectomy  12/17/11  . Incision and drainage of wound  ~ 20ll; 12/03/11    "had infection in my right"  . Shoulder open rotator cuff repair  ~ 2011    right  . Peripherally inserted central catheter insertion  2011 & 11/2011  . Cataract extraction w/ intraocular lens  implant, bilateral  ? 2011  . Coronary artery bypass graft  2000    CABG X5  . Back surgery      lumbar  . Tonsillectomy  1949  . Cholecystectomy  2006 "or after"  . Coronary angioplasty with stent placement  2012  . Anterior cervical corpectomy  12/17/2011    Procedure: ANTERIOR CERVICAL CORPECTOMY;  Surgeon: Karn Cassis, MD;  Location: [facility];  Service: Neurosurgery;  Laterality: N/A;  Anterior Cervical Decompression Fusion Five to Thoracic Two with plating  . Lumbar laminectomy/decompression microdiscectomy Right 06/17/2013    Procedure: LUMBAR LAMINECTOMY/DECOMPRESSION MICRODISCECTOMY 1 LEVEL;  Surgeon: Karn Cassis, MD;  [facility]: [facility];  Service: Neurosurgery;  Laterality: Right;  Right L3-4 Microdiskectomy  . Lumbar wound debridement N/A 08/02/2013    Procedure:  INCISION AND DRAINAGE OF LUMBAR WOUND DEBRIDEMENT;  Surgeon: Karn CassisErnesto M Botero, MD;  Location: [facility];  Service: Neurosurgery;  Laterality: N/A;  . Shoulder arthroscopy Left 09/13/2013    Procedure: ARTHROSCOPIC IRRIGATION AND DEBRIDEMENT, SYNOVECTOMY ,  LEFT SHOULDER ;  Surgeon: Thera FlakeW D Caffrey Jr., MD;  Location: [facility];  Service: Orthopedics;  Laterality: Left;    There were no vitals filed for this visit.  Visit Diagnosis:  Bilateral leg weakness  Muscle weakness (generalized)  Poor balance  Difficulty walking      Subjective Assessment - 02/08/15 1654    Subjective Pt reports he has increased workout and walking in community, current pain scale 5/10 lower back   Currently  in Pain? Yes   Pain Score 5    Pain Location Back   Pain Orientation Lower             [facility] Adult PT Treatment/Exercise - 02/08/15 0001    Exercises   Exercises Knee/Hip;Lumbar   Knee/Hip Exercises: Aerobic   Elliptical Nustep 10 minutes hill level 3 resistance 4   Knee/Hip Exercises: Standing   Lateral Step Up Both;15 reps;Hand Hold: 2;Step Height: 4"   Forward Step Up Both;15 reps;Hand Hold: 2;Step Height: 6"   Functional Squat 15 reps   Other Standing Knee Exercises standing no HHA 5 sets with 65" max; Tandem stance 3x 45" intermittent 1 HHA   Other Standing Knee Exercises sidestepping on line with therapist HHA 2RT, marrching x 10 high holds 3" with bilateral UE's                  PT Short Term Goals - 02/05/15 1328    PT SHORT TERM GOAL #1   Title I HEP   Baseline 7/11- patient reports he is not doing them every day but has started working out again    Time 1   Period Weeks   Status On-going   PT SHORT TERM GOAL #2   Title Pt to be able to stand x 2 minutes without UE support to decrease risk of falling    Baseline 7/11- requires UE support to stand    Time 4   Period Weeks   Status On-going   PT SHORT TERM GOAL #3   Title Pt mm strength to improve by once grade to allow pt to walk with walker x 20 mintues    Time 4   Period Weeks   Status On-going   PT SHORT TERM GOAL #4   Title Pain level no greeater than a 3/10    Baseline 7/11- patient states pain still getting around 5/10   Time 4   Period Weeks   Status On-going           PT Long Term Goals - 02/05/15 1331    PT LONG TERM GOAL #1   Title I in advance HEP   Time 6   Period Weeks   Status On-going   PT LONG TERM GOAL #2   Title Pt to be able to stand for 5 minutes without UE assit to groom self    Time 6   Period Weeks   Status On-going   PT LONG TERM GOAL #3   Title Pt to be able to SLS x 10" to reduce risk of falling   Time 6   Period Weeks   Status On-going   PT LONG TERM  GOAL #4   Title Pt to be walking with a cane inside his home  Time 6   Period Weeks   Status On-going               Plan - 02/08/15 1727    Clinical Impression Statement Pt improving static balance with no HHA for 65" with min guard.  Session focus on improving functional strenghtening and balance.  Able to increase step up height to 6in for functional strengthening though pt does require 1 HHA for balance with stair training today.  Increased resistance and time with Nustep for strengthening and activity tolerance.  No reports of increased pain through session, pt was limited by fatigue.     PT Next Visit Plan Focus on balance and strengthening progressing as able.         Problem List Patient Active Problem List   Diagnosis Date Noted  . Muscle weakness (generalized) 12/05/2013  . Tight fascia 12/05/2013  . Decreased range of motion of right shoulder 12/05/2013  . Decreased range of motion of left shoulder 12/05/2013  . Bilateral leg weakness 11/21/2013  . Poor balance 11/21/2013  . Difficulty in walking(719.7) 11/21/2013  . Shoulder pain, left 09/12/2013  . Anemia, iron deficiency 09/09/2013  . Dyspnea 09/07/2013  . UTI (urinary tract infection) 09/07/2013  . Acute CHF (congestive heart failure) 09/07/2013  . Elevated troponin 09/07/2013  . Fever and chills 09/06/2013  . Altered mental status 09/05/2013  . Neurogenic bowel 08/22/2013  . Cauda equina syndrome with neurogenic bladder 08/22/2013  . Gout flare 08/22/2013  . Postoperative wound infection 08/22/2013  . Lumbar degenerative disc disease 08/02/2013  . Post-operative pain 07/29/2013  . Osteonecrosis 05/05/2013  . Small bowel obstruction 04/17/2013  . Acute renal failure 04/17/2013  . Medial meniscus, posterior horn derangement 02/17/2013  . Knee sprain and strain 02/17/2013  . Trigger point with neck pain 03/18/2012  . Rotator cuff tear arthropathy of right shoulder 09/08/2011  . CAD in native artery  01/09/2011  . Ankle fracture 12/25/2010  . Contusion of shoulder, left 12/25/2010  . PEMPHIGUS VULGARIS 07/02/2010  . MRSA 05/13/2010  . PRURITUS 05/13/2010  . SPINAL STENOSIS, LUMBAR 05/13/2010  . HYPERLIPIDEMIA 04/24/2010  . GASTROESOPHAGEAL REFLUX DISEASE 04/24/2010  . VENTRAL HERNIA, INCISIONAL 04/24/2010  . DEGENERATIVE JOINT DISEASE 04/24/2010  . PYOGENIC ARTHRITIS, SHOULDER REGION 02/04/2010  . HYPERTENSION 01/01/2010  . RUPTURE ROTATOR CUFF 02/19/2009   Becky Sax, LPTA; CBIS [phone]  Juel Burrow 02/08/2015, 6:39 PM  [facility] [address] [place], [place], [address] Phone: [phone]   Fax:  [phone]

## 2015-02-12 ENCOUNTER — Ambulatory Visit (HOSPITAL_COMMUNITY): Payer: Medicare Other | Admitting: Physical Therapy

## 2015-02-12 DIAGNOSIS — R262 Difficulty in walking, not elsewhere classified: Secondary | ICD-10-CM

## 2015-02-12 DIAGNOSIS — M25511 Pain in right shoulder: Secondary | ICD-10-CM | POA: Diagnosis not present

## 2015-02-12 DIAGNOSIS — M6281 Muscle weakness (generalized): Secondary | ICD-10-CM

## 2015-02-12 DIAGNOSIS — M629 Disorder of muscle, unspecified: Secondary | ICD-10-CM | POA: Diagnosis not present

## 2015-02-12 DIAGNOSIS — R29898 Other symptoms and signs involving the musculoskeletal system: Secondary | ICD-10-CM | POA: Diagnosis not present

## 2015-02-12 DIAGNOSIS — R2689 Other abnormalities of gait and mobility: Secondary | ICD-10-CM

## 2015-02-12 NOTE — Therapy (Signed)
[facility] [address] [place], [place], [address] Phone: [phone]   Fax:  [phone]  Physical Therapy Treatment  Patient Details  Name: [name] MRN: [id number] Date of Birth: [birth year] Referring Provider:  Erick Colace, MD  Encounter Date: 02/12/2015      PT End of Session - 02/12/15 1604    Visit Number 11   Number of Visits 20   Date for PT Re-Evaluation 03/05/15   Authorization Type medicare; recert done 7/11   Authorization Time Period Gcode complete 9th session   Authorization - Visit Number 11   Authorization - Number of Visits 19   PT Start Time 1515   PT Stop Time 1606   PT Time Calculation (min) 51 min   Equipment Utilized During Treatment Gait belt   Activity Tolerance Patient tolerated treatment well   Behavior During Therapy [facility] for tasks assessed/performed      Past Medical History  Diagnosis Date  . Hyperlipidemia   . Small bowel problem     HAD ALOT OF SCAR TISSUE FROM PREVIOUS SURGERIES..NG WAS INSERTED ...Marland KitchenMarland KitchenNO SURGERY NEEDED...Marland KitchenMarland KitchenIN FOR 8 DAYS  . Disorder of blood     BEEN TREATED BY DERMATOLOGIST X 4 YRS..."BLOOD BLISTERS"  . Arthritis   . Gout   . Anxiety   . Hx MRSA infection     rt shoulder  . Pneumonia     "I've had it 3-4 times"  . Coronary artery disease     IN 2000   STENT PLACED IN 2012 sees Dr. Dietrich Pates, saw last 2013  . HTN (hypertension)     sees [name] in [place]  . Small bowel obstruction   . Ventral hernia   . Acute renal failure 04/17/2013  . 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 Anemia, iron deficiency 09/09/2013    Past Surgical History  Procedure Laterality Date  . Sternal surg  2000    HAS HAD 5-6 ON HIS STERNUM; "caught MRSA in it"  . Lumbar laminectomy/decompression microdiscectomy  06/13/2011    Procedure: LUMBAR LAMINECTOMY/DECOMPRESSION MICRODISCECTOMY;  Surgeon: [place] Cassis;  Location: [facility];  Service:  Neurosurgery;  Laterality: N/A;  Lumbar three, lumbar four-five Laminectomy  . Eye surgery      bilateral cataract  . Anterior cervical corpectomy  12/17/11  . Incision and drainage of wound  ~ 20ll; 12/03/11    "had infection in my right"  . Shoulder open rotator cuff repair  ~ 2011    right  . Peripherally inserted central catheter insertion  2011 & 11/2011  . Cataract extraction w/ intraocular lens  implant, bilateral  ? 2011  . Coronary artery bypass graft  2000    CABG X5  . Back surgery      lumbar  . Tonsillectomy  1949  . Cholecystectomy  2006 "or after"  . Coronary angioplasty with stent placement  2012  . Anterior cervical corpectomy  12/17/2011    Procedure: ANTERIOR CERVICAL CORPECTOMY;  Surgeon: [place] Cassis, MD;  Location: [facility];  Service: Neurosurgery;  Laterality: N/A;  Anterior Cervical Decompression Fusion Five to Thoracic Two with plating  . Lumbar laminectomy/decompression microdiscectomy Right 06/17/2013    Procedure: LUMBAR LAMINECTOMY/DECOMPRESSION MICRODISCECTOMY 1 LEVEL;  Surgeon: [place] Cassis, MD;  Location: [facility];  Service: Neurosurgery;  Laterality: Right;  Right L3-4 Microdiskectomy  . Lumbar wound debridement N/A 08/02/2013    Procedure: INCISION AND DRAINAGE  OF LUMBAR WOUND DEBRIDEMENT;  Surgeon: [place] Cassis, MD;  Location: [facility];  Service: Neurosurgery;  Laterality: N/A;  . Shoulder arthroscopy Left 09/13/2013    Procedure: ARTHROSCOPIC IRRIGATION AND DEBRIDEMENT, SYNOVECTOMY ,  LEFT SHOULDER ;  Surgeon: Thera Flake., MD;  Location: [facility];  Service: Orthopedics;  Laterality: Left;    There were no vitals filed for this visit.  Visit Diagnosis:  Bilateral leg weakness  Muscle weakness (generalized)  Poor balance  Difficulty walking      Subjective Assessment - 02/12/15 1532    Subjective Pt reports that he has some slight pain today. He worked out this morning, so he is a little more tired today.    Currently in  Pain? Yes   Pain Score 5    Pain Location Back   Pain Orientation Lower           [facility] Adult PT Treatment/Exercise - 02/12/15 0001    Knee/Hip Exercises: Aerobic   Elliptical Nustep 10 minutes hill level 3 resistance 4   Knee/Hip Exercises: Standing   Lateral Step Up Both;15 reps;Step Height: 4";Hand Hold: 1   Forward Step Up Both;15 reps;Step Height: 6";Hand Hold: 1   SLS 30" L, 15"x2 R   Other Standing Knee Exercises standing no HHA 1 sets 45"; Tandem stance 3x 45" intermittent 1 HHA   Other Standing Knee Exercises sidestepping on line with therapist HHA 2RT, marrching x 10 high holds 3" with bilateral UE's, stepping over 4"hurdles in // bars forward and sideways x 5 RT                PT Education - 02/12/15 1603    Education provided No          PT Short Term Goals - 02/05/15 1328    PT SHORT TERM GOAL #1   Title I HEP   Baseline 7/11- patient reports he is not doing them every day but has started working out again    Time 1   Period Weeks   Status On-going   PT SHORT TERM GOAL #2   Title Pt to be able to stand x 2 minutes without UE support to decrease risk of falling    Baseline 7/11- requires UE support to stand    Time 4   Period Weeks   Status On-going   PT SHORT TERM GOAL #3   Title Pt mm strength to improve by once grade to allow pt to walk with walker x 20 mintues    Time 4   Period Weeks   Status On-going   PT SHORT TERM GOAL #4   Title Pain level no greeater than a 3/10    Baseline 7/11- patient states pain still getting around 5/10   Time 4   Period Weeks   Status On-going           PT Long Term Goals - 02/05/15 1331    PT LONG TERM GOAL #1   Title I in advance HEP   Time 6   Period Weeks   Status On-going   PT LONG TERM GOAL #2   Title Pt to be able to stand for 5 minutes without UE assit to groom self    Time 6   Period Weeks   Status On-going   PT LONG TERM GOAL #3   Title Pt to be able to SLS x 10" to reduce risk of  falling   Time 6   Period Weeks  Status On-going   PT LONG TERM GOAL #4   Title Pt to be walking with a cane inside his home   Time 6   Period Weeks   Status On-going               Plan - 02/12/15 1605    Clinical Impression Statement Pt had increased difficulty with static balance activities today due to increased fatigue. He was able to complete stepping over hurdles in // bars with 2 HHA without any LOB and with one occurance of catching hurdle on foot.    PT Next Visit Plan Continue with static and dynamic balance training        Problem List Patient Active Problem List   Diagnosis Date Noted  . Muscle weakness (generalized) 12/05/2013  . Tight fascia 12/05/2013  . Decreased range of motion of right shoulder 12/05/2013  . Decreased range of motion of left shoulder 12/05/2013  . Bilateral leg weakness 11/21/2013  . Poor balance 11/21/2013  . Difficulty in walking(719.7) 11/21/2013  . Shoulder pain, left 09/12/2013  . Anemia, iron deficiency 09/09/2013  . Dyspnea 09/07/2013  . UTI (urinary tract infection) 09/07/2013  . Acute CHF (congestive heart failure) 09/07/2013  . Elevated troponin 09/07/2013  . Fever and chills 09/06/2013  . Altered mental status 09/05/2013  . Neurogenic bowel 08/22/2013  . Cauda equina syndrome with neurogenic bladder 08/22/2013  . Gout flare 08/22/2013  . Postoperative wound infection 08/22/2013  . Lumbar degenerative disc disease 08/02/2013  . Post-operative pain 07/29/2013  . Osteonecrosis 05/05/2013  . Small bowel obstruction 04/17/2013  . Acute renal failure 04/17/2013  . Medial meniscus, posterior horn derangement 02/17/2013  . Knee sprain and strain 02/17/2013  . Trigger point with neck pain 03/18/2012  . Rotator cuff tear arthropathy of right shoulder 09/08/2011  . CAD in native artery 01/09/2011  . Ankle fracture 12/25/2010  . Contusion of shoulder, left 12/25/2010  . PEMPHIGUS VULGARIS 07/02/2010  . MRSA 05/13/2010   . PRURITUS 05/13/2010  . SPINAL STENOSIS, LUMBAR 05/13/2010  . HYPERLIPIDEMIA 04/24/2010  . GASTROESOPHAGEAL REFLUX DISEASE 04/24/2010  . VENTRAL HERNIA, INCISIONAL 04/24/2010  . DEGENERATIVE JOINT DISEASE 04/24/2010  . PYOGENIC ARTHRITIS, SHOULDER REGION 02/04/2010  . HYPERTENSION 01/01/2010  . RUPTURE ROTATOR CUFF 02/19/2009    Leona SingletonLauren Kollin Udell, PT, DPT [phone] 02/12/2015, 4:16 PM  [facility] [address] [place], [place], [address] Phone: [phone]   Fax:  [phone]

## 2015-02-14 ENCOUNTER — Ambulatory Visit (HOSPITAL_COMMUNITY): Payer: Medicare Other | Admitting: Physical Therapy

## 2015-02-14 DIAGNOSIS — M6281 Muscle weakness (generalized): Secondary | ICD-10-CM | POA: Diagnosis not present

## 2015-02-14 DIAGNOSIS — M629 Disorder of muscle, unspecified: Secondary | ICD-10-CM | POA: Diagnosis not present

## 2015-02-14 DIAGNOSIS — R2689 Other abnormalities of gait and mobility: Secondary | ICD-10-CM | POA: Diagnosis not present

## 2015-02-14 DIAGNOSIS — R29898 Other symptoms and signs involving the musculoskeletal system: Secondary | ICD-10-CM | POA: Diagnosis not present

## 2015-02-14 DIAGNOSIS — R262 Difficulty in walking, not elsewhere classified: Secondary | ICD-10-CM

## 2015-02-14 DIAGNOSIS — M25511 Pain in right shoulder: Secondary | ICD-10-CM | POA: Diagnosis not present

## 2015-02-14 NOTE — Therapy (Signed)
[facility] [address] [place], [place], [address] Phone: [phone]   Fax:  [phone]  Physical Therapy Treatment  Patient Details  Name: [name] MRN: [id number] Date of Birth: 11/05/1940 Referring Provider:  Erick Colace, MD  Encounter Date: 02/14/2015      PT End of Session - 02/14/15 1508    Visit Number 12   Number of Visits 20   Date for PT Re-Evaluation 03/05/15   Authorization Type medicare; recert done 7/11   Authorization Time Period Gcode complete 9th session   Authorization - Visit Number 12   Authorization - Number of Visits 19   PT Start Time 1433   PT Stop Time 1515   PT Time Calculation (min) 42 min   Equipment Utilized During Treatment Gait belt   Activity Tolerance Patient tolerated treatment well      Past Medical History  Diagnosis Date  . Hyperlipidemia   . Small bowel problem     HAD ALOT OF SCAR TISSUE FROM PREVIOUS SURGERIES..NG WAS INSERTED ...Marland KitchenMarland KitchenNO SURGERY NEEDED...Marland KitchenMarland KitchenIN FOR 8 DAYS  . Disorder of blood     BEEN TREATED BY DERMATOLOGIST X 4 YRS..."BLOOD BLISTERS"  . Arthritis   . Gout   . Anxiety   . Hx MRSA infection     rt shoulder  . Pneumonia     "I've had it 3-4 times"  . Coronary artery disease     IN 2000   STENT PLACED IN 2012 sees Dr. Dietrich Pates, saw last 2013  . HTN (hypertension)     sees [name] in [place]  . Small bowel obstruction   . Ventral hernia   . Acute renal failure 04/17/2013  . 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 Anemia, iron deficiency 09/09/2013    Past Surgical History  Procedure Laterality Date  . Sternal surg  2000    HAS HAD 5-6 ON HIS STERNUM; "caught MRSA in it"  . Lumbar laminectomy/decompression microdiscectomy  06/13/2011    Procedure: LUMBAR LAMINECTOMY/DECOMPRESSION MICRODISCECTOMY;  Surgeon: Karn Cassis;  [facility]: [facility];  Service: Neurosurgery;  Laterality: N/A;  Lumbar three, lumbar four-five  Laminectomy  . Eye surgery      bilateral cataract  . Anterior cervical corpectomy  12/17/11  . Incision and drainage of wound  ~ 20ll; 12/03/11    "had infection in my right"  . Shoulder open rotator cuff repair  ~ 2011    right  . Peripherally inserted central catheter insertion  2011 & 11/2011  . Cataract extraction w/ intraocular lens  implant, bilateral  ? 2011  . Coronary artery bypass graft  2000    CABG X5  . Back surgery      lumbar  . Tonsillectomy  1949  . Cholecystectomy  2006 "or after"  . Coronary angioplasty with stent placement  2012  . Anterior cervical corpectomy  12/17/2011    Procedure: ANTERIOR CERVICAL CORPECTOMY;  Surgeon: Karn Cassis, MD;  [facility]: [facility];  Service: Neurosurgery;  Laterality: N/A;  Anterior Cervical Decompression Fusion Five to Thoracic Two with plating  . Lumbar laminectomy/decompression microdiscectomy Right 06/17/2013    Procedure: LUMBAR LAMINECTOMY/DECOMPRESSION MICRODISCECTOMY 1 LEVEL;  Surgeon: Karn Cassis, MD;  [facility]: [facility];  Service: Neurosurgery;  Laterality: Right;  Right L3-4 Microdiskectomy  . Lumbar wound debridement N/A 08/02/2013    Procedure: INCISION AND DRAINAGE OF LUMBAR WOUND DEBRIDEMENT;  Surgeon: Karn Cassis,  MD;  [facility]: [facility];  Service: Neurosurgery;  Laterality: N/A;  . Shoulder arthroscopy Left 09/13/2013    Procedure: ARTHROSCOPIC IRRIGATION AND DEBRIDEMENT, SYNOVECTOMY ,  LEFT SHOULDER ;  Surgeon: Thera Flake., MD;  Location: [facility];  Service: Orthopedics;  Laterality: Left;    There were no vitals filed for this visit.  Visit Diagnosis:  Bilateral leg weakness  Muscle weakness (generalized)  Poor balance  Difficulty walking      Subjective Assessment - 02/14/15 1458    Subjective Pt states that he works out everyday now.    Currently in Pain? Yes   Pain Score 5    Pain Location Back   Pain Orientation Lower               [facility] Adult PT Treatment/Exercise -  02/14/15 0001    Lumbar Exercises: Standing   Functional Squats 10 reps   Other Standing Lumbar Exercises hip abduction with t-band x 2 RT    Other Standing Lumbar Exercises rockerboard x 2' ; cone rotation x 1 RT    Lumbar Exercises: Seated   Sit to Stand 10 reps   Knee/Hip Exercises: Standing   SLS B x 5 reps each    Other Standing Knee Exercises Marching x 10    Other Standing Knee Exercises tandem stance; narrow base support with no UE support                 PT Short Term Goals - 02/05/15 1328    PT SHORT TERM GOAL #1   Title I HEP   Baseline 7/11- patient reports he is not doing them every day but has started working out again    Time 1   Period Weeks   Status On-going   PT SHORT TERM GOAL #2   Title Pt to be able to stand x 2 minutes without UE support to decrease risk of falling    Baseline 7/11- requires UE support to stand    Time 4   Period Weeks   Status On-going   PT SHORT TERM GOAL #3   Title Pt mm strength to improve by once grade to allow pt to walk with walker x 20 mintues    Time 4   Period Weeks   Status On-going   PT SHORT TERM GOAL #4   Title Pain level no greeater than a 3/10    Baseline 7/11- patient states pain still getting around 5/10   Time 4   Period Weeks   Status On-going           PT Long Term Goals - 02/05/15 1331    PT LONG TERM GOAL #1   Title I in advance HEP   Time 6   Period Weeks   Status On-going   PT LONG TERM GOAL #2   Title Pt to be able to stand for 5 minutes without UE assit to groom self    Time 6   Period Weeks   Status On-going   PT LONG TERM GOAL #3   Title Pt to be able to SLS x 10" to reduce risk of falling   Time 6   Period Weeks   Status On-going   PT LONG TERM GOAL #4   Title Pt to be walking with a cane inside his home   Time 6   Period Weeks   Status On-going  Plan - 02/14/15 1509    Clinical Impression Statement Pt treatment focused primarily on balance as pt is  working out at the gym everyday.  Pt had difficulty with tandem stance. Pt needed several short rest breaks throughout treatment session.  All exercises were facilitated by therapist for patient safety.    PT Next Visit Plan Continue with static and dynamic balance training        Problem List Patient Active Problem List   Diagnosis Date Noted  . Muscle weakness (generalized) 12/05/2013  . Tight fascia 12/05/2013  . Decreased range of motion of right shoulder 12/05/2013  . Decreased range of motion of left shoulder 12/05/2013  . Bilateral leg weakness 11/21/2013  . Poor balance 11/21/2013  . Difficulty in walking(719.7) 11/21/2013  . Shoulder pain, left 09/12/2013  . Anemia, iron deficiency 09/09/2013  . Dyspnea 09/07/2013  . UTI (urinary tract infection) 09/07/2013  . Acute CHF (congestive heart failure) 09/07/2013  . Elevated troponin 09/07/2013  . Fever and chills 09/06/2013  . Altered mental status 09/05/2013  . Neurogenic bowel 08/22/2013  . Cauda equina syndrome with neurogenic bladder 08/22/2013  . Gout flare 08/22/2013  . Postoperative wound infection 08/22/2013  . Lumbar degenerative disc disease 08/02/2013  . Post-operative pain 07/29/2013  . Osteonecrosis 05/05/2013  . Small bowel obstruction 04/17/2013  . Acute renal failure 04/17/2013  . Medial meniscus, posterior horn derangement 02/17/2013  . Knee sprain and strain 02/17/2013  . Trigger point with neck pain 03/18/2012  . Rotator cuff tear arthropathy of right shoulder 09/08/2011  . CAD in native artery 01/09/2011  . Ankle fracture 12/25/2010  . Contusion of shoulder, left 12/25/2010  . PEMPHIGUS VULGARIS 07/02/2010  . MRSA 05/13/2010  . PRURITUS 05/13/2010  . SPINAL STENOSIS, LUMBAR 05/13/2010  . HYPERLIPIDEMIA 04/24/2010  . GASTROESOPHAGEAL REFLUX DISEASE 04/24/2010  . VENTRAL HERNIA, INCISIONAL 04/24/2010  . DEGENERATIVE JOINT DISEASE 04/24/2010  . PYOGENIC ARTHRITIS, SHOULDER REGION 02/04/2010  .  HYPERTENSION 01/01/2010  . RUPTURE ROTATOR CUFF 02/19/2009    Virgina Organynthia Lisaanne Lawrie, PT CLT [phone] 02/14/2015, 3:17 PM  [facility] [address] , [place], [address] Phone: [phone]   Fax:  [phone]

## 2015-02-15 ENCOUNTER — Other Ambulatory Visit: Payer: Self-pay | Admitting: *Deleted

## 2015-02-15 NOTE — Patient Outreach (Signed)
[employer] Customer service manager [facility]) Care Management  02/15/2015  Masson Nalepa [facility] [birth year] [phone]  Mr. Charlet has successfully achieved agreed upon [facility] Care Management goals and is in agreement to discontinue [facility] Care management services at this time. Mr. Filippi has my direct contact information and knows he can call me at any time should he be in need of our services.    Marja Kays MHA,BSN,RN,CCM [facility] Care Management  [phone]

## 2015-02-19 ENCOUNTER — Ambulatory Visit (HOSPITAL_COMMUNITY): Payer: Medicare Other | Admitting: Physical Therapy

## 2015-02-19 DIAGNOSIS — M25511 Pain in right shoulder: Secondary | ICD-10-CM | POA: Diagnosis not present

## 2015-02-19 DIAGNOSIS — R29898 Other symptoms and signs involving the musculoskeletal system: Secondary | ICD-10-CM | POA: Diagnosis not present

## 2015-02-19 DIAGNOSIS — M6281 Muscle weakness (generalized): Secondary | ICD-10-CM | POA: Diagnosis not present

## 2015-02-19 DIAGNOSIS — R2689 Other abnormalities of gait and mobility: Secondary | ICD-10-CM

## 2015-02-19 DIAGNOSIS — R262 Difficulty in walking, not elsewhere classified: Secondary | ICD-10-CM

## 2015-02-19 DIAGNOSIS — M629 Disorder of muscle, unspecified: Secondary | ICD-10-CM | POA: Diagnosis not present

## 2015-02-19 NOTE — Therapy (Signed)
[facility] [address] [place], [place], [address] Phone: [phone]   Fax:  [phone]  Physical Therapy Treatment  Patient Details  Name: [name] MRN: [id number] Date of Birth: 21-Feb-1941 Referring Provider:  Erick Colace, MD  Encounter Date: 02/19/2015      PT End of Session - 02/19/15 1458    Visit Number 13   Number of Visits 20   Date for PT Re-Evaluation 03/05/15   Authorization Type medicare; recert done 7/11   Authorization Time Period Gcode complete 9th session   Authorization - Visit Number 13   Authorization - Number of Visits 19   PT Start Time 1430   PT Stop Time 1500   PT Time Calculation (min) 30 min   Equipment Utilized During Treatment Gait belt   Activity Tolerance Treatment limited secondary to agitation  Pt was frustrated with his balance deficiet.  "Just let me go now I'm not worth a hill of beans".. Therapist tried to convince pt to stay but he refused       Past Medical History  Diagnosis Date  . Hyperlipidemia   . Small bowel problem     HAD ALOT OF SCAR TISSUE FROM PREVIOUS SURGERIES..NG WAS INSERTED ...Marland KitchenMarland KitchenNO SURGERY NEEDED...Marland KitchenMarland KitchenIN FOR 8 DAYS  . Disorder of blood     BEEN TREATED BY DERMATOLOGIST X 4 YRS..."BLOOD BLISTERS"  . Arthritis   . Gout   . Anxiety   . Hx MRSA infection     rt shoulder  . Pneumonia     "I've had it 3-4 times"  . Coronary artery disease     IN 2000   STENT PLACED IN 2012 sees Dr. Dietrich Pates, saw last 2013  . HTN (hypertension)     sees [name] in [place]  . Small bowel obstruction   . Ventral hernia   . Acute renal failure 04/17/2013  . 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 Anemia, iron deficiency 09/09/2013    Past Surgical History  Procedure Laterality Date  . Sternal surg  2000    HAS HAD 5-6 ON HIS STERNUM; "caught MRSA in it"  . Lumbar laminectomy/decompression microdiscectomy  06/13/2011    Procedure: LUMBAR  LAMINECTOMY/DECOMPRESSION MICRODISCECTOMY;  Surgeon: Karn Cassis;  Location: [facility];  Service: Neurosurgery;  Laterality: N/A;  Lumbar three, lumbar four-five Laminectomy  . Eye surgery      bilateral cataract  . Anterior cervical corpectomy  12/17/11  . Incision and drainage of wound  ~ 20ll; 12/03/11    "had infection in my right"  . Shoulder open rotator cuff repair  ~ 2011    right  . Peripherally inserted central catheter insertion  2011 & 11/2011  . Cataract extraction w/ intraocular lens  implant, bilateral  ? 2011  . Coronary artery bypass graft  2000    CABG X5  . Back surgery      lumbar  . Tonsillectomy  1949  . Cholecystectomy  2006 "or after"  . Coronary angioplasty with stent placement  2012  . Anterior cervical corpectomy  12/17/2011    Procedure: ANTERIOR CERVICAL CORPECTOMY;  Surgeon: Karn Cassis, MD;  Location: [facility];  Service: Neurosurgery;  Laterality: N/A;  Anterior Cervical Decompression Fusion Five to Thoracic Two with plating  . Lumbar laminectomy/decompression microdiscectomy Right 06/17/2013    Procedure: LUMBAR LAMINECTOMY/DECOMPRESSION MICRODISCECTOMY 1 LEVEL;  Surgeon: Karn Cassis, MD;  Location: [facility];  Service: Neurosurgery;  Laterality: Right;  Right L3-4 Microdiskectomy  . Lumbar wound debridement N/A 08/02/2013    Procedure: INCISION AND DRAINAGE OF LUMBAR WOUND DEBRIDEMENT;  Surgeon: Karn Cassis, MD;  Location: [facility];  Service: Neurosurgery;  Laterality: N/A;  . Shoulder arthroscopy Left 09/13/2013    Procedure: ARTHROSCOPIC IRRIGATION AND DEBRIDEMENT, SYNOVECTOMY ,  LEFT SHOULDER ;  Surgeon: Thera Flake., MD;  Location: [facility];  Service: Orthopedics;  Laterality: Left;    There were no vitals filed for this visit.  Visit Diagnosis:  Bilateral leg weakness  Muscle weakness (generalized)  Poor balance  Difficulty walking      Subjective Assessment - 02/19/15 1448    Subjective Back pain with certain  positions.    Pain Score 5    Pain Location Back   Pain Orientation Left   Pain Descriptors / Indicators Aching                         [facility] Adult PT Treatment/Exercise - 02/19/15 0001    Lumbar Exercises: Standing   Functional Squats    Other Standing Lumbar Exercises side step x 2 RT    Other Standing Lumbar Exercises rockerboard x 2' ; cone rotation x 1 RT , tandem stance x 4 ; stand unsupported with arm motiong, narrow base of support with head turns    Lumbar Exercises: Seated   Sit to Stand 10 reps   Knee/Hip Exercises: Standing   SLS B x 5 reps each    Other Standing Knee Exercises Marching x 10    Other Standing Knee Exercises                   PT Short Term Goals - 02/05/15 1328    PT SHORT TERM GOAL #1   Title I HEP   Baseline 7/11- patient reports he is not doing them every day but has started working out again    Time 1   Period Weeks   Status On-going   PT SHORT TERM GOAL #2   Title Pt to be able to stand x 2 minutes without UE support to decrease risk of falling    Baseline 7/11- requires UE support to stand    Time 4   Period Weeks   Status On-going   PT SHORT TERM GOAL #3   Title Pt mm strength to improve by once grade to allow pt to walk with walker x 20 mintues    Time 4   Period Weeks   Status On-going   PT SHORT TERM GOAL #4   Title Pain level no greeater than a 3/10    Baseline 7/11- patient states pain still getting around 5/10   Time 4   Period Weeks   Status On-going           PT Long Term Goals - 02/05/15 1331    PT LONG TERM GOAL #1   Title I in advance HEP   Time 6   Period Weeks   Status On-going   PT LONG TERM GOAL #2   Title Pt to be able to stand for 5 minutes without UE assit to groom self    Time 6   Period Weeks   Status On-going   PT LONG TERM GOAL #3   Title Pt to be able to SLS x 10" to reduce risk of falling   Time 6   Period Weeks  Status On-going   PT LONG TERM GOAL #4   Title Pt  to be walking with a cane inside his home   Time 6   Period Weeks   Status On-going               Plan - 02/19/15 1504    Clinical Impression Statement Pt may deficit remains balance although pt becomes very frustrated if his balance is challenged.  Attempted to explain to pt that challenging balance is the only way to improve balance but pt was too frustrated.     PT Next Visit Plan Continue with static and dynamic balance training        Problem List Patient Active Problem List   Diagnosis Date Noted  . Muscle weakness (generalized) 12/05/2013  . Tight fascia 12/05/2013  . Decreased range of motion of right shoulder 12/05/2013  . Decreased range of motion of left shoulder 12/05/2013  . Bilateral leg weakness 11/21/2013  . Poor balance 11/21/2013  . Difficulty in walking(719.7) 11/21/2013  . Shoulder pain, left 09/12/2013  . Anemia, iron deficiency 09/09/2013  . Dyspnea 09/07/2013  . UTI (urinary tract infection) 09/07/2013  . Acute CHF (congestive heart failure) 09/07/2013  . Elevated troponin 09/07/2013  . Fever and chills 09/06/2013  . Altered mental status 09/05/2013  . Neurogenic bowel 08/22/2013  . Cauda equina syndrome with neurogenic bladder 08/22/2013  . Gout flare 08/22/2013  . Postoperative wound infection 08/22/2013  . Lumbar degenerative disc disease 08/02/2013  . Post-operative pain 07/29/2013  . Osteonecrosis 05/05/2013  . Small bowel obstruction 04/17/2013  . Acute renal failure 04/17/2013  . Medial meniscus, posterior horn derangement 02/17/2013  . Knee sprain and strain 02/17/2013  . Trigger point with neck pain 03/18/2012  . Rotator cuff tear arthropathy of right shoulder 09/08/2011  . CAD in native artery 01/09/2011  . Ankle fracture 12/25/2010  . Contusion of shoulder, left 12/25/2010  . PEMPHIGUS VULGARIS 07/02/2010  . MRSA 05/13/2010  . PRURITUS 05/13/2010  . SPINAL STENOSIS, LUMBAR 05/13/2010  . HYPERLIPIDEMIA 04/24/2010  .  GASTROESOPHAGEAL REFLUX DISEASE 04/24/2010  . VENTRAL HERNIA, INCISIONAL 04/24/2010  . DEGENERATIVE JOINT DISEASE 04/24/2010  . PYOGENIC ARTHRITIS, SHOULDER REGION 02/04/2010  . HYPERTENSION 01/01/2010  . RUPTURE ROTATOR CUFF 02/19/2009    Virgina Organ, PT CLT [phone] 02/19/2015, 3:06 PM  [facility] [address] [place], [place], [address] Phone: [phone]   Fax:  [phone]

## 2015-02-20 NOTE — Patient Outreach (Signed)
[facility]) Care Management  02/20/2015  [name] [birth year] [id number]   Notification from Janalyn Shy, RN to close case due to patient met goals with [place] Management.  [name]. [place], [place] Management [place] Assistant Phone: [phone] Fax: [phone]

## 2015-02-21 ENCOUNTER — Ambulatory Visit (HOSPITAL_COMMUNITY): Payer: Medicare Other | Admitting: Physical Therapy

## 2015-02-21 DIAGNOSIS — M25511 Pain in right shoulder: Secondary | ICD-10-CM | POA: Diagnosis not present

## 2015-02-21 DIAGNOSIS — G834 Cauda equina syndrome: Secondary | ICD-10-CM

## 2015-02-21 DIAGNOSIS — R29898 Other symptoms and signs involving the musculoskeletal system: Secondary | ICD-10-CM | POA: Diagnosis not present

## 2015-02-21 DIAGNOSIS — R2689 Other abnormalities of gait and mobility: Secondary | ICD-10-CM

## 2015-02-21 DIAGNOSIS — M6281 Muscle weakness (generalized): Secondary | ICD-10-CM | POA: Diagnosis not present

## 2015-02-21 DIAGNOSIS — M629 Disorder of muscle, unspecified: Secondary | ICD-10-CM | POA: Diagnosis not present

## 2015-02-21 DIAGNOSIS — M6289 Other specified disorders of muscle: Secondary | ICD-10-CM

## 2015-02-21 DIAGNOSIS — R262 Difficulty in walking, not elsewhere classified: Secondary | ICD-10-CM

## 2015-02-21 DIAGNOSIS — M25612 Stiffness of left shoulder, not elsewhere classified: Secondary | ICD-10-CM

## 2015-02-21 DIAGNOSIS — M25611 Stiffness of right shoulder, not elsewhere classified: Secondary | ICD-10-CM

## 2015-02-21 NOTE — Therapy (Signed)
[facility] [address] [place], [place], [address] Phone: [phone]   Fax:  [phone]  Physical Therapy Treatment  Patient Details  Name: [name] MRN: [id number] Date of Birth: 08/29/1940 Referring Provider:  Erick Colace, MD  Encounter Date: 02/21/2015      PT End of Session - 02/21/15 0920    Visit Number 14   Number of Visits 20   Date for PT Re-Evaluation 03/05/15   Authorization Type medicare; recert done 7/11   Authorization Time Period Gcode complete 9th session   Authorization - Visit Number 14   Authorization - Number of Visits 19   PT Start Time 0848   PT Stop Time 0928   PT Time Calculation (min) 40 min   Equipment Utilized During Treatment Gait belt   Activity Tolerance Patient tolerated treatment well  Pt was frustrated with his balance deficiet.  "Just let me go now I'm not worth a hill of beans".. Therapist tried to convince pt to stay but he refused    Behavior During Therapy [facility] for tasks assessed/performed      Past Medical History  Diagnosis Date  . Hyperlipidemia   . Small bowel problem     HAD ALOT OF SCAR TISSUE FROM PREVIOUS SURGERIES..NG WAS INSERTED ...Marland KitchenMarland KitchenNO SURGERY NEEDED...Marland KitchenMarland KitchenIN FOR 8 DAYS  . Disorder of blood     BEEN TREATED BY DERMATOLOGIST X 4 YRS..."BLOOD BLISTERS"  . Arthritis   . Gout   . Anxiety   . Hx MRSA infection     rt shoulder  . Pneumonia     "I've had it 3-4 times"  . Coronary artery disease     IN 2000   STENT PLACED IN 2012 sees Dr. Dietrich Pates, saw last 2013  . HTN (hypertension)     sees [name] in [place]  . Small bowel obstruction   . Ventral hernia   . Acute renal failure 04/17/2013  . 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 Anemia, iron deficiency 09/09/2013    Past Surgical History  Procedure Laterality Date  . Sternal surg  2000    HAS HAD 5-6 ON HIS STERNUM; "caught MRSA in it"  . Lumbar laminectomy/decompression  microdiscectomy  06/13/2011    Procedure: LUMBAR LAMINECTOMY/DECOMPRESSION MICRODISCECTOMY;  Surgeon: Karn Cassis;  [facility]: [facility];  Service: Neurosurgery;  Laterality: N/A;  Lumbar three, lumbar four-five Laminectomy  . Eye surgery      bilateral cataract  . Anterior cervical corpectomy  12/17/11  . Incision and drainage of wound  ~ 20ll; 12/03/11    "had infection in my right"  . Shoulder open rotator cuff repair  ~ 2011    right  . Peripherally inserted central catheter insertion  2011 & 11/2011  . Cataract extraction w/ intraocular lens  implant, bilateral  ? 2011  . Coronary artery bypass graft  2000    CABG X5  . Back surgery      lumbar  . Tonsillectomy  1949  . Cholecystectomy  2006 "or after"  . Coronary angioplasty with stent placement  2012  . Anterior cervical corpectomy  12/17/2011    Procedure: ANTERIOR CERVICAL CORPECTOMY;  Surgeon: Karn Cassis, MD;  [facility]: [facility];  Service: Neurosurgery;  Laterality: N/A;  Anterior Cervical Decompression Fusion Five to Thoracic Two with plating  . Lumbar laminectomy/decompression microdiscectomy Right 06/17/2013    Procedure: LUMBAR LAMINECTOMY/DECOMPRESSION MICRODISCECTOMY 1 LEVEL;  Surgeon: Lynne Logan  Sandrea Matte, MD;  Location: [facility];  Service: Neurosurgery;  Laterality: Right;  Right L3-4 Microdiskectomy  . Lumbar wound debridement N/A 08/02/2013    Procedure: INCISION AND DRAINAGE OF LUMBAR WOUND DEBRIDEMENT;  Surgeon: Karn Cassis, MD;  [facility]: [facility];  Service: Neurosurgery;  Laterality: N/A;  . Shoulder arthroscopy Left 09/13/2013    Procedure: ARTHROSCOPIC IRRIGATION AND DEBRIDEMENT, SYNOVECTOMY ,  LEFT SHOULDER ;  Surgeon: Thera Flake., MD;  Location: [facility];  Service: Orthopedics;  Laterality: Left;    There were no vitals filed for this visit.  Visit Diagnosis:  Bilateral leg weakness  Muscle weakness (generalized)  Poor balance  Difficulty walking  Tight fascia  Decreased range of  motion of right shoulder  Decreased range of motion of left shoulder  Cauda equina syndrome      Subjective Assessment - 02/21/15 1003    Subjective Pt states he continues to go to [employer] for additional exercise.  Reports he is frustrated with his balance.   Currently in Pain? No/denies                         [facility] Adult PT Treatment/Exercise - 02/21/15 0919    Lumbar Exercises: Standing   Other Standing Lumbar Exercises side step x 2 RT    Lumbar Exercises: Seated   Sit to Stand 10 reps   Sit to Stand Limitations no UE's   Knee/Hip Exercises: Aerobic   Elliptical Nustep 10 minutes hill level 3 resistance 5 LE only   Knee/Hip Exercises: Standing   SLS B x 5 reps each    Other Standing Knee Exercises Marching x 10 1 HHA   Other Standing Knee Exercises tandem stance max of 17" without UE assistance; narrow base support with no UE support                     PT Short Term Goals - 02/05/15 1328    PT SHORT TERM GOAL #1   Title I HEP   Baseline 7/11- patient reports he is not doing them every day but has started working out again    Time 1   Period Weeks   Status On-going   PT SHORT TERM GOAL #2   Title Pt to be able to stand x 2 minutes without UE support to decrease risk of falling    Baseline 7/11- requires UE support to stand    Time 4   Period Weeks   Status On-going   PT SHORT TERM GOAL #3   Title Pt mm strength to improve by once grade to allow pt to walk with walker x 20 mintues    Time 4   Period Weeks   Status On-going   PT SHORT TERM GOAL #4   Title Pain level no greeater than a 3/10    Baseline 7/11- patient states pain still getting around 5/10   Time 4   Period Weeks   Status On-going           PT Long Term Goals - 02/05/15 1331    PT LONG TERM GOAL #1   Title I in advance HEP   Time 6   Period Weeks   Status On-going   PT LONG TERM GOAL #2   Title Pt to be able to stand for 5 minutes without UE assit to  groom self    Time 6   Period Weeks  Status On-going   PT LONG TERM GOAL #3   Title Pt to be able to SLS x 10" to reduce risk of falling   Time 6   Period Weeks   Status On-going   PT LONG TERM GOAL #4   Title Pt to be walking with a cane inside his home   Time 6   Period Weeks   Status On-going               Plan - 02/21/15 1610    Clinical Impression Statement continued to focus on improving balance with decreased dependence on UE's.  Noted improvement in LE strength, especially with STS without UE use.  Pt becomes easily frustrated with lack of stability with SLS and tandem stance actvities.  Encouraged patient to practice at home at kitchen sink with [facility] seat behind him.l   PT Next Visit Plan Progress balance activities.          Problem List Patient Active Problem List   Diagnosis Date Noted  . Muscle weakness (generalized) 12/05/2013  . Tight fascia 12/05/2013  . Decreased range of motion of right shoulder 12/05/2013  . Decreased range of motion of left shoulder 12/05/2013  . Bilateral leg weakness 11/21/2013  . Poor balance 11/21/2013  . Difficulty in walking(719.7) 11/21/2013  . Shoulder pain, left 09/12/2013  . Anemia, iron deficiency 09/09/2013  . Dyspnea 09/07/2013  . UTI (urinary tract infection) 09/07/2013  . Acute CHF (congestive heart failure) 09/07/2013  . Elevated troponin 09/07/2013  . Fever and chills 09/06/2013  . Altered mental status 09/05/2013  . Neurogenic bowel 08/22/2013  . Cauda equina syndrome with neurogenic bladder 08/22/2013  . Gout flare 08/22/2013  . Postoperative wound infection 08/22/2013  . Lumbar degenerative disc disease 08/02/2013  . Post-operative pain 07/29/2013  . Osteonecrosis 05/05/2013  . Small bowel obstruction 04/17/2013  . Acute renal failure 04/17/2013  . Medial meniscus, posterior horn derangement 02/17/2013  . Knee sprain and strain 02/17/2013  . Trigger point with neck pain 03/18/2012  . Rotator cuff  tear arthropathy of right shoulder 09/08/2011  . CAD in native artery 01/09/2011  . Ankle fracture 12/25/2010  . Contusion of shoulder, left 12/25/2010  . PEMPHIGUS VULGARIS 07/02/2010  . MRSA 05/13/2010  . PRURITUS 05/13/2010  . SPINAL STENOSIS, LUMBAR 05/13/2010  . HYPERLIPIDEMIA 04/24/2010  . GASTROESOPHAGEAL REFLUX DISEASE 04/24/2010  . VENTRAL HERNIA, INCISIONAL 04/24/2010  . DEGENERATIVE JOINT DISEASE 04/24/2010  . PYOGENIC ARTHRITIS, SHOULDER REGION 02/04/2010  . HYPERTENSION 01/01/2010  . RUPTURE ROTATOR CUFF 02/19/2009    Lurena Nida, PTA/CLT [phone]  02/21/2015, 10:04 AM  [facility] [address] [place], [place], [address] Phone: [phone]   Fax:  [phone]

## 2015-02-23 ENCOUNTER — Ambulatory Visit: Payer: Medicare Other | Admitting: Urology

## 2015-02-26 ENCOUNTER — Ambulatory Visit (HOSPITAL_COMMUNITY): Payer: Medicare Other | Attending: Orthopedic Surgery | Admitting: Physical Therapy

## 2015-02-26 DIAGNOSIS — M6281 Muscle weakness (generalized): Secondary | ICD-10-CM | POA: Insufficient documentation

## 2015-02-26 DIAGNOSIS — G834 Cauda equina syndrome: Secondary | ICD-10-CM | POA: Diagnosis not present

## 2015-02-26 DIAGNOSIS — M7582 Other shoulder lesions, left shoulder: Secondary | ICD-10-CM | POA: Diagnosis not present

## 2015-02-26 DIAGNOSIS — R2689 Other abnormalities of gait and mobility: Secondary | ICD-10-CM | POA: Insufficient documentation

## 2015-02-26 DIAGNOSIS — R262 Difficulty in walking, not elsewhere classified: Secondary | ICD-10-CM | POA: Insufficient documentation

## 2015-02-26 DIAGNOSIS — M25511 Pain in right shoulder: Secondary | ICD-10-CM | POA: Insufficient documentation

## 2015-02-26 DIAGNOSIS — R29898 Other symptoms and signs involving the musculoskeletal system: Secondary | ICD-10-CM | POA: Diagnosis not present

## 2015-02-26 DIAGNOSIS — M629 Disorder of muscle, unspecified: Secondary | ICD-10-CM | POA: Diagnosis not present

## 2015-02-26 DIAGNOSIS — R2681 Unsteadiness on feet: Secondary | ICD-10-CM | POA: Diagnosis not present

## 2015-02-26 NOTE — Therapy (Signed)
[facility] [address] [place], [place], [address] Phone: [phone]   Fax:  [phone]  Physical Therapy Treatment  Patient Details  Name: [name] MRN: [id number] Date of Birth: 1940-08-16 Referring Provider:  Erick Colace, MD  Encounter Date: 02/26/2015      PT End of Session - 02/26/15 0922    Visit Number 15   Number of Visits 20   Date for PT Re-Evaluation 03/05/15   Authorization Type medicare; recert done 7/11   Authorization Time Period Gcode complete 9th session   Authorization - Visit Number 15   Authorization - Number of Visits 19   PT Start Time 0837   PT Stop Time 0926   PT Time Calculation (min) 49 min      Past Medical History  Diagnosis Date  . Hyperlipidemia   . Small bowel problem     HAD ALOT OF SCAR TISSUE FROM PREVIOUS SURGERIES..NG WAS INSERTED ...Marland KitchenMarland KitchenNO SURGERY NEEDED...Marland KitchenMarland KitchenIN FOR 8 DAYS  . Disorder of blood     BEEN TREATED BY DERMATOLOGIST X 4 YRS..."BLOOD BLISTERS"  . Arthritis   . Gout   . Anxiety   . Hx MRSA infection     rt shoulder  . Pneumonia     "I've had it 3-4 times"  . Coronary artery disease     IN 2000   STENT PLACED IN 2012 sees Dr. Dietrich Pates, saw last 2013  . HTN (hypertension)     sees [name] in [place]  . Small bowel obstruction   . Ventral hernia   . Acute renal failure 04/17/2013  . 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 Anemia, iron deficiency 09/09/2013    Past Surgical History  Procedure Laterality Date  . Sternal surg  2000    HAS HAD 5-6 ON HIS STERNUM; "caught MRSA in it"  . Lumbar laminectomy/decompression microdiscectomy  06/13/2011    Procedure: LUMBAR LAMINECTOMY/DECOMPRESSION MICRODISCECTOMY;  Surgeon: Karn Cassis;  Location: [facility];  Service: Neurosurgery;  Laterality: N/A;  Lumbar three, lumbar four-five Laminectomy  . Eye surgery      bilateral cataract  . Anterior cervical corpectomy  12/17/11  . Incision  and drainage of wound  ~ 20ll; 12/03/11    "had infection in my right"  . Shoulder open rotator cuff repair  ~ 2011    right  . Peripherally inserted central catheter insertion  2011 & 11/2011  . Cataract extraction w/ intraocular lens  implant, bilateral  ? 2011  . Coronary artery bypass graft  2000    CABG X5  . Back surgery      lumbar  . Tonsillectomy  1949  . Cholecystectomy  2006 "or after"  . Coronary angioplasty with stent placement  2012  . Anterior cervical corpectomy  12/17/2011    Procedure: ANTERIOR CERVICAL CORPECTOMY;  Surgeon: Karn Cassis, MD;  Location: [facility];  Service: Neurosurgery;  Laterality: N/A;  Anterior Cervical Decompression Fusion Five to Thoracic Two with plating  . Lumbar laminectomy/decompression microdiscectomy Right 06/17/2013    Procedure: LUMBAR LAMINECTOMY/DECOMPRESSION MICRODISCECTOMY 1 LEVEL;  Surgeon: Karn Cassis, MD;  Location: [facility];  Service: Neurosurgery;  Laterality: Right;  Right L3-4 Microdiskectomy  . Lumbar wound debridement N/A 08/02/2013    Procedure: INCISION AND DRAINAGE OF LUMBAR WOUND DEBRIDEMENT;  Surgeon: Karn Cassis, MD;  Location: [facility];  Service: Neurosurgery;  Laterality: N/A;  . Shoulder arthroscopy  Left 09/13/2013    Procedure: ARTHROSCOPIC IRRIGATION AND DEBRIDEMENT, SYNOVECTOMY ,  LEFT SHOULDER ;  Surgeon: Thera Flake., MD;  Location: [facility];  Service: Orthopedics;  Laterality: Left;    There were no vitals filed for this visit.  Visit Diagnosis:  Bilateral leg weakness  Poor balance  Difficulty walking      Subjective Assessment - 02/26/15 0834    Subjective Pt states that when he puts weight on his Lt leg he has quite a bit of pain.  Pt states that he is walking more.    Pertinent History Mr. Spangler  was hospitalized for back surgery 11/2012 with relief of pain but  developed Lt drop foot. He had a second surgery in November for his right side. This surgery was suppose to be in  overnight but due to complications he ended up in the hospital for several days he went IP rehab and went home right before Christmas. He then had a flare infection in January and was admitted for an UTI and a Lt rotator cuff surgery. He then went to the [facility]. He stayed at the [facility] for 9 weeks and then went home on 11/18/2013.  He recieved extensive OP surgery after his discharge.  B RTC    Currently in Pain? Yes   Pain Score 6   when putting weight on his Lt leg.                 Balance Exercises - 02/26/15 0847    Balance Exercises: Standing   Standing Eyes Opened Narrow base of support (BOS)  one hand hold; normal stance no hand hold    Tandem Stance --   SLS Eyes open;5 reps   Wall Bumps Shoulder;Hip   Wall Bumps-Shoulders Eyes opened;10 reps   Stepping Strategy Anterior;Posterior;Lateral;5 reps   Partial Tandem Stance Eyes open;2 reps   Sidestepping 2 reps   Cone Rotation Solid surface;Right turn;Left turn   Marching Limitations 10 reps    Sit to Stand Time 10           PT Education - 02/26/15 0921    Education provided Yes   Education Details Just stand with walker in front of pt and couch behind multiple times a day to improve balance.    Person(s) Educated Patient   Methods Explanation   Comprehension Verbalized understanding          PT Short Term Goals - 02/05/15 1328    PT SHORT TERM GOAL #1   Title I HEP   Baseline 7/11- patient reports he is not doing them every day but has started working out again    Time 1   Period Weeks   Status On-going   PT SHORT TERM GOAL #2   Title Pt to be able to stand x 2 minutes without UE support to decrease risk of falling    Baseline 7/11- requires UE support to stand    Time 4   Period Weeks   Status On-going   PT SHORT TERM GOAL #3   Title Pt mm strength to improve by once grade to allow pt to walk with walker x 20 mintues    Time 4   Period Weeks   Status On-going   PT SHORT TERM GOAL #4    Title Pain level no greeater than a 3/10    Baseline 7/11- patient states pain still getting around 5/10   Time 4   Period Weeks  Status On-going           PT Long Term Goals - 02/05/15 1331    PT LONG TERM GOAL #1   Title I in advance HEP   Time 6   Period Weeks   Status On-going   PT LONG TERM GOAL #2   Title Pt to be able to stand for 5 minutes without UE assit to groom self    Time 6   Period Weeks   Status On-going   PT LONG TERM GOAL #3   Title Pt to be able to SLS x 10" to reduce risk of falling   Time 6   Period Weeks   Status On-going   PT LONG TERM GOAL #4   Title Pt to be walking with a cane inside his home   Time 6   Period Weeks   Status On-going               Plan - 02/26/15 1324    Clinical Impression Statement Today's treatment focused on balance.   Balance is very slow to improve due to pt wanting to use hands to pull himself into balance rather than using core mm to self correct.  Therapist explained this to pt and stressed the importance of not using his hands to gain his balance back.    PT Treatment/Interventions Therapeutic exercise;Balance training;Therapeutic activities;Functional mobility training;Stair training;Gait training;Patient/family education   PT Next Visit Plan Progress balance activities.          Problem List Patient Active Problem List   Diagnosis Date Noted  . Muscle weakness (generalized) 12/05/2013  . Tight fascia 12/05/2013  . Decreased range of motion of right shoulder 12/05/2013  . Decreased range of motion of left shoulder 12/05/2013  . Bilateral leg weakness 11/21/2013  . Poor balance 11/21/2013  . Difficulty in walking(719.7) 11/21/2013  . Shoulder pain, left 09/12/2013  . Anemia, iron deficiency 09/09/2013  . Dyspnea 09/07/2013  . UTI (urinary tract infection) 09/07/2013  . Acute CHF (congestive heart failure) 09/07/2013  . Elevated troponin 09/07/2013  . Fever and chills 09/06/2013  . Altered  mental status 09/05/2013  . Neurogenic bowel 08/22/2013  . Cauda equina syndrome with neurogenic bladder 08/22/2013  . Gout flare 08/22/2013  . Postoperative wound infection 08/22/2013  . Lumbar degenerative disc disease 08/02/2013  . Post-operative pain 07/29/2013  . Osteonecrosis 05/05/2013  . Small bowel obstruction 04/17/2013  . Acute renal failure 04/17/2013  . Medial meniscus, posterior horn derangement 02/17/2013  . Knee sprain and strain 02/17/2013  . Trigger point with neck pain 03/18/2012  . Rotator cuff tear arthropathy of right shoulder 09/08/2011  . CAD in native artery 01/09/2011  . Ankle fracture 12/25/2010  . Contusion of shoulder, left 12/25/2010  . PEMPHIGUS VULGARIS 07/02/2010  . MRSA 05/13/2010  . PRURITUS 05/13/2010  . SPINAL STENOSIS, LUMBAR 05/13/2010  . HYPERLIPIDEMIA 04/24/2010  . GASTROESOPHAGEAL REFLUX DISEASE 04/24/2010  . VENTRAL HERNIA, INCISIONAL 04/24/2010  . DEGENERATIVE JOINT DISEASE 04/24/2010  . PYOGENIC ARTHRITIS, SHOULDER REGION 02/04/2010  . HYPERTENSION 01/01/2010  . RUPTURE ROTATOR CUFF 02/19/2009    Virgina Organ, PT CLT [phone]  02/26/2015, 9:25 AM  [facility] [address] [place], [place], [address] Phone: [phone]   Fax:  [phone]

## 2015-02-27 ENCOUNTER — Other Ambulatory Visit: Payer: Self-pay | Admitting: Urology

## 2015-02-27 DIAGNOSIS — R339 Retention of urine, unspecified: Secondary | ICD-10-CM

## 2015-02-27 DIAGNOSIS — N319 Neuromuscular dysfunction of bladder, unspecified: Secondary | ICD-10-CM

## 2015-02-28 ENCOUNTER — Ambulatory Visit (HOSPITAL_COMMUNITY): Payer: Medicare Other | Admitting: Physical Therapy

## 2015-02-28 DIAGNOSIS — R2689 Other abnormalities of gait and mobility: Secondary | ICD-10-CM

## 2015-02-28 DIAGNOSIS — M629 Disorder of muscle, unspecified: Secondary | ICD-10-CM | POA: Diagnosis not present

## 2015-02-28 DIAGNOSIS — R29898 Other symptoms and signs involving the musculoskeletal system: Secondary | ICD-10-CM

## 2015-02-28 DIAGNOSIS — R2681 Unsteadiness on feet: Secondary | ICD-10-CM | POA: Diagnosis not present

## 2015-02-28 DIAGNOSIS — R262 Difficulty in walking, not elsewhere classified: Secondary | ICD-10-CM | POA: Diagnosis not present

## 2015-02-28 DIAGNOSIS — M6281 Muscle weakness (generalized): Secondary | ICD-10-CM | POA: Diagnosis not present

## 2015-02-28 NOTE — Therapy (Signed)
[place] [facility] [address] [place], [place], [address] Phone: [phone]   Fax:  [phone]  Physical Therapy Treatment  Patient Details  Name: [name] MRN: [id number] Date of Birth: 1940-09-08 Referring Provider:  Erick Colace, MD  Encounter Date: 02/28/2015      PT End of Session - 02/28/15 0927    Visit Number 16   Number of Visits 20   Date for PT Re-Evaluation 03/05/15   Authorization Type medicare; recert done 7/11   Authorization Time Period Gcode complete 9th session   Authorization - Visit Number 16   Authorization - Number of Visits 19   PT Start Time 0846   PT Stop Time 0932   PT Time Calculation (min) 46 min   Equipment Utilized During Treatment Gait belt   Activity Tolerance Patient tolerated treatment well   Behavior During Therapy [facility] for tasks assessed/performed      Past Medical History  Diagnosis Date  . Hyperlipidemia   . Small bowel problem     HAD ALOT OF SCAR TISSUE FROM PREVIOUS SURGERIES..NG WAS INSERTED ...Marland KitchenMarland KitchenNO SURGERY NEEDED...Marland KitchenMarland KitchenIN FOR 8 DAYS  . Disorder of blood     BEEN TREATED BY DERMATOLOGIST X 4 YRS..."BLOOD BLISTERS"  . Arthritis   . Gout   . Anxiety   . Hx MRSA infection     rt shoulder  . Pneumonia     "I've had it 3-4 times"  . Coronary artery disease     IN 2000   STENT PLACED IN 2012 sees Dr. Dietrich Pates, saw last 2013  . HTN (hypertension)     sees [name] in [place]  . Small bowel obstruction   . Ventral hernia   . Acute renal failure 04/17/2013  . 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 Anemia, iron deficiency 09/09/2013    Past Surgical History  Procedure Laterality Date  . Sternal surg  2000    HAS HAD 5-6 ON HIS STERNUM; "caught MRSA in it"  . Lumbar laminectomy/decompression microdiscectomy  06/13/2011    Procedure: LUMBAR LAMINECTOMY/DECOMPRESSION MICRODISCECTOMY;  Surgeon: Karn Cassis;  Location: [facility];  Service:  Neurosurgery;  Laterality: N/A;  Lumbar three, lumbar four-five Laminectomy  . Eye surgery      bilateral cataract  . Anterior cervical corpectomy  12/17/11  . Incision and drainage of wound  ~ 20ll; 12/03/11    "had infection in my right"  . Shoulder open rotator cuff repair  ~ 2011    right  . Peripherally inserted central catheter insertion  2011 & 11/2011  . Cataract extraction w/ intraocular lens  implant, bilateral  ? 2011  . Coronary artery bypass graft  2000    CABG X5  . Back surgery      lumbar  . Tonsillectomy  1949  . Cholecystectomy  2006 "or after"  . Coronary angioplasty with stent placement  2012  . Anterior cervical corpectomy  12/17/2011    Procedure: ANTERIOR CERVICAL CORPECTOMY;  Surgeon: Karn Cassis, MD;  Location: [facility];  Service: Neurosurgery;  Laterality: N/A;  Anterior Cervical Decompression Fusion Five to Thoracic Two with plating  . Lumbar laminectomy/decompression microdiscectomy Right 06/17/2013    Procedure: LUMBAR LAMINECTOMY/DECOMPRESSION MICRODISCECTOMY 1 LEVEL;  Surgeon: Karn Cassis, MD;  Location: [facility];  Service: Neurosurgery;  Laterality: Right;  Right L3-4 Microdiskectomy  . Lumbar wound debridement N/A 08/02/2013    Procedure: INCISION AND DRAINAGE  OF LUMBAR WOUND DEBRIDEMENT;  Surgeon: Karn Cassis, MD;  Location: [facility];  Service: Neurosurgery;  Laterality: N/A;  . Shoulder arthroscopy Left 09/13/2013    Procedure: ARTHROSCOPIC IRRIGATION AND DEBRIDEMENT, SYNOVECTOMY ,  LEFT SHOULDER ;  Surgeon: Thera Flake., MD;  Location: [facility];  Service: Orthopedics;  Laterality: Left;    There were no vitals filed for this visit.  Visit Diagnosis:  Bilateral leg weakness  Poor balance  Difficulty walking  Muscle weakness (generalized)      Subjective Assessment - 02/28/15 0851    Subjective Pt reports that he still has pain when he puts weight on his L leg, but he reports that that is normal.              [facility]  Adult PT Treatment/Exercise - 02/28/15 0001    Lumbar Exercises: Standing   Other Standing Lumbar Exercises side step x 2 RT    Other Standing Lumbar Exercises rockerboard x 2' ; cone rotation x 1 RT , tandem stance x 4 ; stand unsupported with arm motiong, narrow base of support with head turns    Lumbar Exercises: Seated   Sit to Stand 10 reps   Sit to Stand Limitations no UE's   Knee/Hip Exercises: Aerobic   Stationary Bike 10 minutes level 3             Balance Exercises - 02/28/15 0900    Balance Exercises: Standing   Standing Eyes Opened Narrow base of support (BOS)  x30 seconds on foam with normal BOS, one UE support   Tandem Stance Upper extremity support 1;2 reps;20 secs   SLS Eyes open;Upper extremity support 2;5 reps;10 secs   Wall Bumps-Shoulders Eyes opened;10 reps   Cone Rotation Solid surface   Marching Limitations 10 reps             PT Short Term Goals - 02/05/15 1328    PT SHORT TERM GOAL #1   Title I HEP   Baseline 7/11- patient reports he is not doing them every day but has started working out again    Time 1   Period Weeks   Status On-going   PT SHORT TERM GOAL #2   Title Pt to be able to stand x 2 minutes without UE support to decrease risk of falling    Baseline 7/11- requires UE support to stand    Time 4   Period Weeks   Status On-going   PT SHORT TERM GOAL #3   Title Pt mm strength to improve by once grade to allow pt to walk with walker x 20 mintues    Time 4   Period Weeks   Status On-going   PT SHORT TERM GOAL #4   Title Pain level no greeater than a 3/10    Baseline 7/11- patient states pain still getting around 5/10   Time 4   Period Weeks   Status On-going           PT Long Term Goals - 02/05/15 1331    PT LONG TERM GOAL #1   Title I in advance HEP   Time 6   Period Weeks   Status On-going   PT LONG TERM GOAL #2   Title Pt to be able to stand for 5 minutes without UE assit to groom self    Time 6   Period Weeks    Status On-going   PT LONG TERM GOAL #3   Title  Pt to be able to SLS x 10" to reduce risk of falling   Time 6   Period Weeks   Status On-going   PT LONG TERM GOAL #4   Title Pt to be walking with a cane inside his home   Time 6   Period Weeks   Status On-going               Plan - 02/28/15 1610    Clinical Impression Statement Treatment session focused on static and dynamic balance to decrease fall risk. Pt continues to require UE support during all standing balance activities. Verbal cueing was given during wall bumps and sit to stands to decrease UE support on RW.    PT Next Visit Plan Continue with balance training, attempt balance activities outside of bars to decrease ability to use UE support        Problem List Patient Active Problem List   Diagnosis Date Noted  . Muscle weakness (generalized) 12/05/2013  . Tight fascia 12/05/2013  . Decreased range of motion of right shoulder 12/05/2013  . Decreased range of motion of left shoulder 12/05/2013  . Bilateral leg weakness 11/21/2013  . Poor balance 11/21/2013  . Difficulty in walking(719.7) 11/21/2013  . Shoulder pain, left 09/12/2013  . Anemia, iron deficiency 09/09/2013  . Dyspnea 09/07/2013  . UTI (urinary tract infection) 09/07/2013  . Acute CHF (congestive heart failure) 09/07/2013  . Elevated troponin 09/07/2013  . Fever and chills 09/06/2013  . Altered mental status 09/05/2013  . Neurogenic bowel 08/22/2013  . Cauda equina syndrome with neurogenic bladder 08/22/2013  . Gout flare 08/22/2013  . Postoperative wound infection 08/22/2013  . Lumbar degenerative disc disease 08/02/2013  . Post-operative pain 07/29/2013  . Osteonecrosis 05/05/2013  . Small bowel obstruction 04/17/2013  . Acute renal failure 04/17/2013  . Medial meniscus, posterior horn derangement 02/17/2013  . Knee sprain and strain 02/17/2013  . Trigger point with neck pain 03/18/2012  . Rotator cuff tear arthropathy of right  shoulder 09/08/2011  . CAD in native artery 01/09/2011  . Ankle fracture 12/25/2010  . Contusion of shoulder, left 12/25/2010  . PEMPHIGUS VULGARIS 07/02/2010  . MRSA 05/13/2010  . PRURITUS 05/13/2010  . SPINAL STENOSIS, LUMBAR 05/13/2010  . HYPERLIPIDEMIA 04/24/2010  . GASTROESOPHAGEAL REFLUX DISEASE 04/24/2010  . VENTRAL HERNIA, INCISIONAL 04/24/2010  . DEGENERATIVE JOINT DISEASE 04/24/2010  . PYOGENIC ARTHRITIS, SHOULDER REGION 02/04/2010  . HYPERTENSION 01/01/2010  . RUPTURE ROTATOR CUFF 02/19/2009    Leona Singleton, PT, DPT [phone] 02/28/2015, 9:33 AM  [facility] [address] [place], [place], [address] Phone: [phone]   Fax:  [phone]

## 2015-03-05 ENCOUNTER — Ambulatory Visit (HOSPITAL_COMMUNITY): Payer: Medicare Other | Admitting: Physical Therapy

## 2015-03-05 DIAGNOSIS — G834 Cauda equina syndrome: Secondary | ICD-10-CM

## 2015-03-05 DIAGNOSIS — R2681 Unsteadiness on feet: Secondary | ICD-10-CM | POA: Diagnosis not present

## 2015-03-05 DIAGNOSIS — M629 Disorder of muscle, unspecified: Secondary | ICD-10-CM | POA: Diagnosis not present

## 2015-03-05 DIAGNOSIS — M6289 Other specified disorders of muscle: Secondary | ICD-10-CM

## 2015-03-05 DIAGNOSIS — R2689 Other abnormalities of gait and mobility: Secondary | ICD-10-CM | POA: Diagnosis not present

## 2015-03-05 DIAGNOSIS — R262 Difficulty in walking, not elsewhere classified: Secondary | ICD-10-CM

## 2015-03-05 DIAGNOSIS — M6281 Muscle weakness (generalized): Secondary | ICD-10-CM | POA: Diagnosis not present

## 2015-03-05 DIAGNOSIS — M25611 Stiffness of right shoulder, not elsewhere classified: Secondary | ICD-10-CM

## 2015-03-05 DIAGNOSIS — R29898 Other symptoms and signs involving the musculoskeletal system: Secondary | ICD-10-CM | POA: Diagnosis not present

## 2015-03-05 DIAGNOSIS — M25612 Stiffness of left shoulder, not elsewhere classified: Secondary | ICD-10-CM

## 2015-03-05 NOTE — Therapy (Signed)
[facility] [address] [place], [place], [address] Phone: [phone]   Fax:  [phone]  Physical Therapy Treatment  Patient Details  Name: [name] MRN: [id number] Date of Birth: 26-Jan-1941 Referring Provider:  Erick Colace, MD  Encounter Date: 03/05/2015      PT End of Session - 03/05/15 0923    Visit Number 17   Number of Visits 20   Date for PT Re-Evaluation 03/05/15   Authorization Type medicare; recert done 7/11   Authorization Time Period Gcode complete 9th session   Authorization - Visit Number 17   Authorization - Number of Visits 19   PT Start Time 0848   PT Stop Time 0930   PT Time Calculation (min) 42 min   Equipment Utilized During Treatment Gait belt   Activity Tolerance Patient tolerated treatment well      Past Medical History  Diagnosis Date  . Hyperlipidemia   . Small bowel problem     HAD ALOT OF SCAR TISSUE FROM PREVIOUS SURGERIES..NG WAS INSERTED ...Marland KitchenMarland KitchenNO SURGERY NEEDED...Marland KitchenMarland KitchenIN FOR 8 DAYS  . Disorder of blood     BEEN TREATED BY DERMATOLOGIST X 4 YRS..."BLOOD BLISTERS"  . Arthritis   . Gout   . Anxiety   . Hx MRSA infection     rt shoulder  . Pneumonia     "I've had it 3-4 times"  . Coronary artery disease     IN 2000   STENT PLACED IN 2012 sees Dr. Dietrich Pates, saw last 2013  . HTN (hypertension)     sees [name] in [place]  . Small bowel obstruction   . Ventral hernia   . Acute renal failure 04/17/2013  . 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 Anemia, iron deficiency 09/09/2013    Past Surgical History  Procedure Laterality Date  . Sternal surg  2000    HAS HAD 5-6 ON HIS STERNUM; "caught MRSA in it"  . Lumbar laminectomy/decompression microdiscectomy  06/13/2011    Procedure: LUMBAR LAMINECTOMY/DECOMPRESSION MICRODISCECTOMY;  Surgeon: Karn Cassis;  Location: [facility];  Service: Neurosurgery;  Laterality: N/A;  Lumbar three, lumbar four-five  Laminectomy  . Eye surgery      bilateral cataract  . Anterior cervical corpectomy  12/17/11  . Incision and drainage of wound  ~ 20ll; 12/03/11    "had infection in my right"  . Shoulder open rotator cuff repair  ~ 2011    right  . Peripherally inserted central catheter insertion  2011 & 11/2011  . Cataract extraction w/ intraocular lens  implant, bilateral  ? 2011  . Coronary artery bypass graft  2000    CABG X5  . Back surgery      lumbar  . Tonsillectomy  1949  . Cholecystectomy  2006 "or after"  . Coronary angioplasty with stent placement  2012  . Anterior cervical corpectomy  12/17/2011    Procedure: ANTERIOR CERVICAL CORPECTOMY;  Surgeon: Karn Cassis, MD;  Location: [facility];  Service: Neurosurgery;  Laterality: N/A;  Anterior Cervical Decompression Fusion Five to Thoracic Two with plating  . Lumbar laminectomy/decompression microdiscectomy Right 06/17/2013    Procedure: LUMBAR LAMINECTOMY/DECOMPRESSION MICRODISCECTOMY 1 LEVEL;  Surgeon: Karn Cassis, MD;  Location: [facility];  Service: Neurosurgery;  Laterality: Right;  Right L3-4 Microdiskectomy  . Lumbar wound debridement N/A 08/02/2013    Procedure: INCISION AND DRAINAGE OF LUMBAR WOUND DEBRIDEMENT;  Surgeon: Karn Cassis,  MD;  Location: [facility];  Service: Neurosurgery;  Laterality: N/A;  . Shoulder arthroscopy Left 09/13/2013    Procedure: ARTHROSCOPIC IRRIGATION AND DEBRIDEMENT, SYNOVECTOMY ,  LEFT SHOULDER ;  Surgeon: Thera Flake., MD;  Location: [facility];  Service: Orthopedics;  Laterality: Left;    There were no vitals filed for this visit.  Visit Diagnosis:  Bilateral leg weakness  Poor balance  Difficulty walking  Muscle weakness (generalized)  Tight fascia  Decreased range of motion of right shoulder  Decreased range of motion of left shoulder  Cauda equina syndrome      Subjective Assessment - 03/05/15 0848    Subjective Pt continues to do his exercises at home.    Currently in  Pain? Yes   Pain Score 5    Pain Location Back   Pain Orientation Left   Pain Descriptors / Indicators Aching   Pain Type Chronic pain            Balance Exercises - 03/05/15 0859    Balance Exercises: Standing   Standing Eyes Opened Head turns;3 reps;30 secs  x30 seconds on foam with normal BOS, one UE support   Tandem Stance Eyes open;2 reps   SLS Eyes open;Upper extremity support 2;5 reps;10 secs   Wall Bumps Shoulder;Hip   Wall Bumps-Shoulders Eyes opened;10 reps   Stepping Strategy Anterior;Posterior;Lateral;5 reps   Sidestepping 2 reps   Cone Rotation Solid surface;Upper extremity assist 1;Right turn;Left turn   Marching Limitations 10 reps   Other Standing Exercises normal stance eyes closed              PT Short Term Goals - 02/05/15 1328    PT SHORT TERM GOAL #1   Title I HEP   Baseline 7/11- patient reports he is not doing them every day but has started working out again    Time 1   Period Weeks   Status On-going   PT SHORT TERM GOAL #2   Title Pt to be able to stand x 2 minutes without UE support to decrease risk of falling    Baseline 7/11- requires UE support to stand    Time 4   Period Weeks   Status On-going   PT SHORT TERM GOAL #3   Title Pt mm strength to improve by once grade to allow pt to walk with walker x 20 mintues    Time 4   Period Weeks   Status On-going   PT SHORT TERM GOAL #4   Title Pain level no greeater than a 3/10    Baseline 7/11- patient states pain still getting around 5/10   Time 4   Period Weeks   Status On-going           PT Long Term Goals - 02/05/15 1331    PT LONG TERM GOAL #1   Title I in advance HEP   Time 6   Period Weeks   Status On-going   PT LONG TERM GOAL #2   Title Pt to be able to stand for 5 minutes without UE assit to groom self    Time 6   Period Weeks   Status On-going   PT LONG TERM GOAL #3   Title Pt to be able to SLS x 10" to reduce risk of falling   Time 6   Period Weeks   Status  On-going   PT LONG TERM GOAL #4   Title Pt to be walking with a cane inside  his home   Time 6   Period Weeks   Status On-going               Plan - 03/05/15 4098    Clinical Impression Statement Pt with overall improvement in balance exerceses but continues to need UE and therapist facilitation for safety.  Pt needs constant cuing to decrease reliance on UE support to challenge balance.    Pt will benefit from skilled therapeutic intervention in order to improve on the following deficits Abnormal gait;Decreased activity tolerance;Decreased balance;Difficulty walking;Impaired perceived functional ability;Decreased strength   Rehab Potential Good   PT Frequency 2x / week   PT Treatment/Interventions Therapeutic exercise;Balance training;Therapeutic activities;Functional mobility training;Stair training;Gait training;Patient/family education   PT Next Visit Plan Continue with balance training, attempt balance activities outside of bars to decrease ability to use UE support        Problem List Patient Active Problem List   Diagnosis Date Noted  . Muscle weakness (generalized) 12/05/2013  . Tight fascia 12/05/2013  . Decreased range of motion of right shoulder 12/05/2013  . Decreased range of motion of left shoulder 12/05/2013  . Bilateral leg weakness 11/21/2013  . Poor balance 11/21/2013  . Difficulty in walking(719.7) 11/21/2013  . Shoulder pain, left 09/12/2013  . Anemia, iron deficiency 09/09/2013  . Dyspnea 09/07/2013  . UTI (urinary tract infection) 09/07/2013  . Acute CHF (congestive heart failure) 09/07/2013  . Elevated troponin 09/07/2013  . Fever and chills 09/06/2013  . Altered mental status 09/05/2013  . Neurogenic bowel 08/22/2013  . Cauda equina syndrome with neurogenic bladder 08/22/2013  . Gout flare 08/22/2013  . Postoperative wound infection 08/22/2013  . Lumbar degenerative disc disease 08/02/2013  . Post-operative pain 07/29/2013  . Osteonecrosis  05/05/2013  . Small bowel obstruction 04/17/2013  . Acute renal failure 04/17/2013  . Medial meniscus, posterior horn derangement 02/17/2013  . Knee sprain and strain 02/17/2013  . Trigger point with neck pain 03/18/2012  . Rotator cuff tear arthropathy of right shoulder 09/08/2011  . CAD in native artery 01/09/2011  . Ankle fracture 12/25/2010  . Contusion of shoulder, left 12/25/2010  . PEMPHIGUS VULGARIS 07/02/2010  . MRSA 05/13/2010  . PRURITUS 05/13/2010  . SPINAL STENOSIS, LUMBAR 05/13/2010  . HYPERLIPIDEMIA 04/24/2010  . GASTROESOPHAGEAL REFLUX DISEASE 04/24/2010  . VENTRAL HERNIA, INCISIONAL 04/24/2010  . DEGENERATIVE JOINT DISEASE 04/24/2010  . PYOGENIC ARTHRITIS, SHOULDER REGION 02/04/2010  . HYPERTENSION 01/01/2010  . RUPTURE ROTATOR CUFF 02/19/2009   Virgina Organ, PT CLT [phone] 03/05/2015, 9:31 AM  [facility] [address] [place], [place], [address] Phone: [phone]   Fax:  [phone]

## 2015-03-07 ENCOUNTER — Encounter (HOSPITAL_COMMUNITY): Payer: Medicare Other | Admitting: Physical Therapy

## 2015-03-08 ENCOUNTER — Ambulatory Visit (HOSPITAL_COMMUNITY): Admission: RE | Admit: 2015-03-08 | Payer: Medicare Other | Source: Ambulatory Visit

## 2015-03-12 ENCOUNTER — Ambulatory Visit (HOSPITAL_COMMUNITY): Payer: Medicare Other | Admitting: Physical Therapy

## 2015-03-12 DIAGNOSIS — R29898 Other symptoms and signs involving the musculoskeletal system: Secondary | ICD-10-CM | POA: Diagnosis not present

## 2015-03-12 DIAGNOSIS — R2689 Other abnormalities of gait and mobility: Secondary | ICD-10-CM

## 2015-03-12 DIAGNOSIS — M629 Disorder of muscle, unspecified: Secondary | ICD-10-CM | POA: Diagnosis not present

## 2015-03-12 DIAGNOSIS — M6281 Muscle weakness (generalized): Secondary | ICD-10-CM | POA: Diagnosis not present

## 2015-03-12 DIAGNOSIS — R262 Difficulty in walking, not elsewhere classified: Secondary | ICD-10-CM | POA: Diagnosis not present

## 2015-03-12 DIAGNOSIS — R2681 Unsteadiness on feet: Secondary | ICD-10-CM | POA: Diagnosis not present

## 2015-03-12 NOTE — Therapy (Signed)
[facility] [address] [place], [place], [address] Phone: [phone]   Fax:  [phone]  Physical Therapy Treatment  Patient Details  Name: [name] MRN: [id number] Date of Birth: 05-04-1941 Referring Provider:  Erick Colace, MD  Encounter Date: 03/12/2015      PT End of Session - 03/12/15 0842    Visit Number 18   Number of Visits 20   Authorization Type medicare; recert done 7/11   Authorization - Visit Number 18   Authorization - Number of Visits 19   PT Start Time 0802   PT Stop Time 0846   PT Time Calculation (min) 44 min   Equipment Utilized During Treatment Gait belt   Activity Tolerance Patient tolerated treatment well      Past Medical History  Diagnosis Date  . Hyperlipidemia   . Small bowel problem     HAD ALOT OF SCAR TISSUE FROM PREVIOUS SURGERIES..NG WAS INSERTED ...Marland KitchenMarland KitchenNO SURGERY NEEDED...Marland KitchenMarland KitchenIN FOR 8 DAYS  . Disorder of blood     BEEN TREATED BY DERMATOLOGIST X 4 YRS..."BLOOD BLISTERS"  . Arthritis   . Gout   . Anxiety   . Hx MRSA infection     rt shoulder  . Pneumonia     "I've had it 3-4 times"  . Coronary artery disease     IN 2000   STENT PLACED IN 2012 sees Dr. Dietrich Pates, saw last 2013  . HTN (hypertension)     sees [name] in [place]  . Small bowel obstruction   . Ventral hernia   . Acute renal failure 04/17/2013  . 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 Anemia, iron deficiency 09/09/2013    Past Surgical History  Procedure Laterality Date  . Sternal surg  2000    HAS HAD 5-6 ON HIS STERNUM; "caught MRSA in it"  . Lumbar laminectomy/decompression microdiscectomy  06/13/2011    Procedure: LUMBAR LAMINECTOMY/DECOMPRESSION MICRODISCECTOMY;  Surgeon: Karn Cassis;  Location: [facility];  Service: Neurosurgery;  Laterality: N/A;  Lumbar three, lumbar four-five Laminectomy  . Eye surgery      bilateral cataract  . Anterior cervical corpectomy  12/17/11  .  Incision and drainage of wound  ~ 20ll; 12/03/11    "had infection in my right"  . Shoulder open rotator cuff repair  ~ 2011    right  . Peripherally inserted central catheter insertion  2011 & 11/2011  . Cataract extraction w/ intraocular lens  implant, bilateral  ? 2011  . Coronary artery bypass graft  2000    CABG X5  . Back surgery      lumbar  . Tonsillectomy  1949  . Cholecystectomy  2006 "or after"  . Coronary angioplasty with stent placement  2012  . Anterior cervical corpectomy  12/17/2011    Procedure: ANTERIOR CERVICAL CORPECTOMY;  Surgeon: Karn Cassis, MD;  Location: [facility];  Service: Neurosurgery;  Laterality: N/A;  Anterior Cervical Decompression Fusion Five to Thoracic Two with plating  . Lumbar laminectomy/decompression microdiscectomy Right 06/17/2013    Procedure: LUMBAR LAMINECTOMY/DECOMPRESSION MICRODISCECTOMY 1 LEVEL;  Surgeon: Karn Cassis, MD;  Location: [facility];  Service: Neurosurgery;  Laterality: Right;  Right L3-4 Microdiskectomy  . Lumbar wound debridement N/A 08/02/2013    Procedure: INCISION AND DRAINAGE OF LUMBAR WOUND DEBRIDEMENT;  Surgeon: Karn Cassis, MD;  Location: [facility];  Service: Neurosurgery;  Laterality: N/A;  . Shoulder arthroscopy  Left 09/13/2013    Procedure: ARTHROSCOPIC IRRIGATION AND DEBRIDEMENT, SYNOVECTOMY ,  LEFT SHOULDER ;  Surgeon: Thera Flake., MD;  Location: [facility];  Service: Orthopedics;  Laterality: Left;    There were no vitals filed for this visit.  Visit Diagnosis:  Bilateral leg weakness  Poor balance      Subjective Assessment - 03/12/15 0800    Subjective Pt continues to feel that balance is what he needs to work on the most at this time.    Pertinent History Mr. Rosello  was hospitalized for back surgery 11/2012 with relief of pain but  developed Lt drop foot. He had a second surgery in November for his right side. This surgery was suppose to be in overnight but due to complications he ended up  in the hospital for several days he went IP rehab and went home right before Christmas. He then had a flare infection in January and was admitted for an UTI and a Lt rotator cuff surgery. He then went to the [facility]. He stayed at the [facility] for 9 weeks and then went home on 11/18/2013.  He recieved extensive OP surgery after his discharge.  B RTC             Balance Exercises - 03/12/15 0810    Balance Exercises: Standing   Standing Eyes Opened 2 reps;Time  60 second hold; narrow base of support.   Tandem Stance Eyes open;Upper extremity support 1;2 reps   Wall Bumps Shoulder;Hip   Wall Bumps-Shoulders Eyes opened;10 reps   Stepping Strategy Anterior;Posterior;Lateral;5 reps  3 times each then change to opposite leg x 5 reps    Sidestepping 2 reps   Cone Rotation Solid surface;Upper extremity assist 1;Right turn;Left turn   Marching Limitations 10 reps            PT Education - 03/12/15 0841    Education provided Yes   Education Details To stand at couch pt states he does not do this due to getting frustrated.    Person(s) Educated Patient   Methods Explanation;Demonstration   Comprehension Verbalized understanding          PT Short Term Goals - 02/05/15 1328    PT SHORT TERM GOAL #1   Title I HEP   Baseline 7/11- patient reports he is not doing them every day but has started working out again    Time 1   Period Weeks   Status On-going   PT SHORT TERM GOAL #2   Title Pt to be able to stand x 2 minutes without UE support to decrease risk of falling    Baseline 7/11- requires UE support to stand    Time 4   Period Weeks   Status On-going   PT SHORT TERM GOAL #3   Title Pt mm strength to improve by once grade to allow pt to walk with walker x 20 mintues    Time 4   Period Weeks   Status On-going   PT SHORT TERM GOAL #4   Title Pain level no greeater than a 3/10    Baseline 7/11- patient states pain still getting around 5/10   Time 4   Period Weeks    Status On-going           PT Long Term Goals - 02/05/15 1331    PT LONG TERM GOAL #1   Title I in advance HEP   Time 6   Period Weeks  Status On-going   PT LONG TERM GOAL #2   Title Pt to be able to stand for 5 minutes without UE assit to groom self    Time 6   Period Weeks   Status On-going   PT LONG TERM GOAL #3   Title Pt to be able to SLS x 10" to reduce risk of falling   Time 6   Period Weeks   Status On-going   PT LONG TERM GOAL #4   Title Pt to be walking with a cane inside his home   Time 6   Period Weeks   Status On-going               Plan - 03/12/15 0843    Clinical Impression Statement Pt continues to have pain if he puts too much weight on his LT LE, (pain is in his back).  Pt very frustrated with progress   Pt will benefit from skilled therapeutic intervention in order to improve on the following deficits Abnormal gait;Decreased activity tolerance;Decreased balance;Difficulty walking;Impaired perceived functional ability;Decreased strength   PT Treatment/Interventions Therapeutic exercise;Balance training;Therapeutic activities;Functional mobility training;Stair training;Gait training;Patient/family education   PT Next Visit Plan reasses next treatment.  G-codes due.         Problem List Patient Active Problem List   Diagnosis Date Noted  . Muscle weakness (generalized) 12/05/2013  . Tight fascia 12/05/2013  . Decreased range of motion of right shoulder 12/05/2013  . Decreased range of motion of left shoulder 12/05/2013  . Bilateral leg weakness 11/21/2013  . Poor balance 11/21/2013  . Difficulty in walking(719.7) 11/21/2013  . Shoulder pain, left 09/12/2013  . Anemia, iron deficiency 09/09/2013  . Dyspnea 09/07/2013  . UTI (urinary tract infection) 09/07/2013  . Acute CHF (congestive heart failure) 09/07/2013  . Elevated troponin 09/07/2013  . Fever and chills 09/06/2013  . Altered mental status 09/05/2013  . Neurogenic bowel  08/22/2013  . Cauda equina syndrome with neurogenic bladder 08/22/2013  . Gout flare 08/22/2013  . Postoperative wound infection 08/22/2013  . Lumbar degenerative disc disease 08/02/2013  . Post-operative pain 07/29/2013  . Osteonecrosis 05/05/2013  . Small bowel obstruction 04/17/2013  . Acute renal failure 04/17/2013  . Medial meniscus, posterior horn derangement 02/17/2013  . Knee sprain and strain 02/17/2013  . Trigger point with neck pain 03/18/2012  . Rotator cuff tear arthropathy of right shoulder 09/08/2011  . CAD in native artery 01/09/2011  . Ankle fracture 12/25/2010  . Contusion of shoulder, left 12/25/2010  . PEMPHIGUS VULGARIS 07/02/2010  . MRSA 05/13/2010  . PRURITUS 05/13/2010  . SPINAL STENOSIS, LUMBAR 05/13/2010  . HYPERLIPIDEMIA 04/24/2010  . GASTROESOPHAGEAL REFLUX DISEASE 04/24/2010  . VENTRAL HERNIA, INCISIONAL 04/24/2010  . DEGENERATIVE JOINT DISEASE 04/24/2010  . PYOGENIC ARTHRITIS, SHOULDER REGION 02/04/2010  . HYPERTENSION 01/01/2010  . RUPTURE ROTATOR CUFF 02/19/2009    Virgina Organ, PT CLT [phone] 03/12/2015, 8:45 AM  [facility] [address] [place], [place], [address] Phone: [phone]   Fax:  [phone]

## 2015-03-14 ENCOUNTER — Ambulatory Visit (HOSPITAL_COMMUNITY): Payer: Medicare Other | Admitting: Physical Therapy

## 2015-03-14 DIAGNOSIS — M6281 Muscle weakness (generalized): Secondary | ICD-10-CM | POA: Diagnosis not present

## 2015-03-14 DIAGNOSIS — R262 Difficulty in walking, not elsewhere classified: Secondary | ICD-10-CM | POA: Diagnosis not present

## 2015-03-14 DIAGNOSIS — R2681 Unsteadiness on feet: Secondary | ICD-10-CM | POA: Diagnosis not present

## 2015-03-14 DIAGNOSIS — R29898 Other symptoms and signs involving the musculoskeletal system: Secondary | ICD-10-CM | POA: Diagnosis not present

## 2015-03-14 DIAGNOSIS — R2689 Other abnormalities of gait and mobility: Secondary | ICD-10-CM | POA: Diagnosis not present

## 2015-03-14 DIAGNOSIS — M629 Disorder of muscle, unspecified: Secondary | ICD-10-CM | POA: Diagnosis not present

## 2015-03-14 NOTE — Therapy (Signed)
[place] [facility] [address] [place], [place], [address] Phone: [phone]   Fax:  [phone]  Physical Therapy Treatment (Re-Assessment)  Patient Details  Name: [name] MRN: [id number] Date of Birth: 21-Sep-1940 Referring Provider:  Erick Colace, MD  Encounter Date: 03/14/2015      PT End of Session - 03/14/15 0915    Visit Number 19   Number of Visits 21   Date for PT Re-Evaluation 04/11/15   Authorization Type medicare; recert done 7/11   Authorization Time Period Gcode complete 19th session   Authorization - Visit Number 19   Authorization - Number of Visits 29   PT Start Time 0800   PT Stop Time 0838   PT Time Calculation (min) 38 min   Activity Tolerance Patient tolerated treatment well   Behavior During Therapy [facility] for tasks assessed/performed      Past Medical History  Diagnosis Date  . Hyperlipidemia   . Small bowel problem     HAD ALOT OF SCAR TISSUE FROM PREVIOUS SURGERIES..NG WAS INSERTED ...Marland KitchenMarland KitchenNO SURGERY NEEDED...Marland KitchenMarland KitchenIN FOR 8 DAYS  . Disorder of blood     BEEN TREATED BY DERMATOLOGIST X 4 YRS..."BLOOD BLISTERS"  . Arthritis   . Gout   . Anxiety   . Hx MRSA infection     rt shoulder  . Pneumonia     "I've had it 3-4 times"  . Coronary artery disease     IN 2000   STENT PLACED IN 2012 sees Dr. Dietrich [name], saw last 2013  . HTN (hypertension)     sees Dr. Juanetta [name] in [place]  . Small bowel obstruction   . Ventral hernia   . Acute renal failure 04/17/2013  . 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 Anemia, iron deficiency 09/09/2013    Past Surgical History  Procedure Laterality Date  . Sternal surg  2000    HAS HAD 5-6 ON HIS STERNUM; "caught MRSA in it"  . Lumbar laminectomy/decompression microdiscectomy  06/13/2011    Procedure: LUMBAR LAMINECTOMY/DECOMPRESSION MICRODISCECTOMY;  Surgeon: [name];  Location: [facility];  Service: Neurosurgery;  Laterality: N/A;  Lumbar  three, lumbar four-five Laminectomy  . Eye surgery      bilateral cataract  . Anterior cervical corpectomy  12/17/11  . Incision and drainage of wound  ~ 20ll; 12/03/11    "had infection in my right"  . Shoulder open rotator cuff repair  ~ 2011    right  . Peripherally inserted central catheter insertion  2011 & 11/2011  . Cataract extraction w/ intraocular lens  implant, bilateral  ? 2011  . Coronary artery bypass graft  2000    CABG X5  . Back surgery      lumbar  . Tonsillectomy  1949  . Cholecystectomy  2006 "or after"  . Coronary angioplasty with stent placement  2012  . Anterior cervical corpectomy  12/17/2011    Procedure: ANTERIOR CERVICAL CORPECTOMY;  Surgeon: Jake Cassis, MD;  Location: [facility];  Service: Neurosurgery;  Laterality: N/A;  Anterior Cervical Decompression Fusion Five to Thoracic Two with plating  . Lumbar laminectomy/decompression microdiscectomy Right 06/17/2013    Procedure: LUMBAR LAMINECTOMY/DECOMPRESSION MICRODISCECTOMY 1 LEVEL;  Surgeon: Jake Cassis, MD;  Location: [facility];  Service: Neurosurgery;  Laterality: Right;  Right L3-4 Microdiskectomy  . Lumbar wound debridement N/A 08/02/2013    Procedure: INCISION AND DRAINAGE OF LUMBAR WOUND DEBRIDEMENT;  Surgeon: Jake Logan  Sandrea Matte, MD;  Location: [facility];  Service: Neurosurgery;  Laterality: N/A;  . Shoulder arthroscopy Left 09/13/2013    Procedure: ARTHROSCOPIC IRRIGATION AND DEBRIDEMENT, SYNOVECTOMY ,  LEFT SHOULDER ;  Surgeon: Jake Flake., MD;  Location: [facility] OR;  Service: Orthopedics;  Laterality: Left;    There were no vitals filed for this visit.  Visit Diagnosis:  Bilateral leg weakness  Difficulty walking  Muscle weakness (generalized)  Unsteadiness      Subjective Assessment - 03/14/15 0803    Subjective Patient reports that he is having some pain in his back right now, nothing major going on otherwise    Pertinent History [name]  was hospitalized for back surgery  11/2012 with relief of pain but  developed Lt drop foot. He had a second surgery in November for his right side. This surgery was suppose to be in overnight but due to complications he ended up in the hospital for several days he went IP rehab and went home right before Christmas. He then had a flare infection in January and was admitted for an UTI and a Lt rotator cuff surgery. He then went to the [facility]. He stayed at the [facility] for 9 weeks and then went home on 11/18/2013.  He recieved extensive OP surgery after his discharge.  B RTC    How long can you stand comfortably? 8/17- patient reports that he still feels like he is getting stronger    How long can you walk comfortably? 8/17- patient reports that he still feels like he is getting stronger and better at walking    Patient Stated Goals Pt would like to have better balance and to be able to walk without the walker    Currently in Pain? Yes   Pain Score 5    Pain Location Back            [facility] PT Assessment - 03/14/15 0001    Observation/Other Assessments   Focus on Therapeutic Outcomes (FOTO)  47% limited    Functional Tests   Functional tests Sit to Stand   Sit to Stand   Comments 10 in 30 seconds using hands    Strength   Right Hip Flexion 4+/5   Right Hip Extension 2/5   Right Hip ABduction 3-/5   Left Hip Flexion 4+/5   Left Hip Extension 2/5   Left Hip ABduction 2/5   Right Knee Flexion 2-/5   Right Knee Extension 4+/5   Left Knee Flexion 2+/5   Left Knee Extension 4+/5   Right Ankle Dorsiflexion 4/5   Left Ankle Dorsiflexion 1/5   6 minute walk test results    Aerobic Endurance Distance Walked 601   Endurance additional comments 0.96m/s                          Balance Exercises - 03/14/15 0914    Balance Exercises: Standing   Standing Eyes Closed Narrow base of support (BOS);Foam/compliant surface;3 reps;20 secs   Tandem Stance Eyes open;3 reps;10 secs;Other (comment)  no HHA     Standing, One Foot on a Step Eyes closed;6 inch;3 reps;10 secs           PT Education - 03/14/15 0914    Education provided Yes   Education Details plan of care moving forward, progress with skilled PT services    Person(s) Educated Patient   Methods Explanation   Comprehension Verbalized understanding  PT Short Term Goals - 02/05/15 1328    PT SHORT TERM GOAL #1   Title I HEP   Baseline 7/11- patient reports he is not doing them every day but has started working out again    Time 1   Period Weeks   Status On-going   PT SHORT TERM GOAL #2   Title Pt to be able to stand x 2 minutes without UE support to decrease risk of falling    Baseline 7/11- requires UE support to stand    Time 4   Period Weeks   Status On-going   PT SHORT TERM GOAL #3   Title Pt mm strength to improve by once grade to allow pt to walk with walker x 20 mintues    Time 4   Period Weeks   Status On-going   PT SHORT TERM GOAL #4   Title Pain level no greeater than a 3/10    Baseline 7/11- patient states pain still getting around 5/10   Time 4   Period Weeks   Status On-going           PT Long Term Goals - 02/05/15 1331    PT LONG TERM GOAL #1   Title I in advance HEP   Time 6   Period Weeks   Status On-going   PT LONG TERM GOAL #2   Title Pt to be able to stand for 5 minutes without UE assit to groom self    Time 6   Period Weeks   Status On-going   PT LONG TERM GOAL #3   Title Pt to be able to SLS x 10" to reduce risk of falling   Time 6   Period Weeks   Status On-going   PT LONG TERM GOAL #4   Title Pt to be walking with a cane inside his home   Time 6   Period Weeks   Status On-going               Plan - 04/01/15 0916    Clinical Impression Statement Re-Asssessment performed today. Patient continues to experience back pain and also continues to demonstrate generalized weakness, postural and gait deficits, impaired balance, reduced functional activity  tolerance, and reduced functional task performance at this time. WIth the exception of improved 6 minute walk distance and gait speed, patient does not show significant improvement with skilled PT services at this time. He will benefit most from 1-2 more sessions to develop appropriate advanced HEP  and to address any remaining concerns with physcial function at home/in community. Patient appeared generally very frustrated with progress today with reduced motivation to continue participating in skilled PT services at this time.    Pt will benefit from skilled therapeutic intervention in order to improve on the following deficits Abnormal gait;Decreased activity tolerance;Decreased balance;Difficulty walking;Impaired perceived functional ability;Decreased strength   Rehab Potential Good   PT Frequency 2x / week   PT Treatment/Interventions Therapeutic exercise;Balance training;Therapeutic activities;Functional mobility training;Stair training;Gait training;Patient/family education   PT Next Visit Plan Focus on developing advanced HEP, DC within next 1-2 sessions.    PT Home Exercise Plan No changes; encouraged pt to remain consistent with HEP.    Consulted and Agree with Plan of Care Patient          G-Codes - 04-01-15 0920    Functional Assessment Tool Used FOTO 47% limited    Functional Limitation Mobility: Walking and moving around   Mobility: Walking and Moving Around  Current Status [phone]) At least 40 percent but less than 60 percent impaired, limited or restricted   Mobility: Walking and Moving Around Goal Status [phone]) At least 20 percent but less than 40 percent impaired, limited or restricted      Problem List Patient Active Problem List   Diagnosis Date Noted  . Muscle weakness (generalized) 12/05/2013  . Tight fascia 12/05/2013  . Decreased range of motion of right shoulder 12/05/2013  . Decreased range of motion of left shoulder 12/05/2013  . Bilateral leg weakness 11/21/2013   . Poor balance 11/21/2013  . Difficulty in walking(719.7) 11/21/2013  . Shoulder pain, left 09/12/2013  . Anemia, iron deficiency 09/09/2013  . Dyspnea 09/07/2013  . UTI (urinary tract infection) 09/07/2013  . Acute CHF (congestive heart failure) 09/07/2013  . Elevated troponin 09/07/2013  . Fever and chills 09/06/2013  . Altered mental status 09/05/2013  . Neurogenic bowel 08/22/2013  . Cauda equina syndrome with neurogenic bladder 08/22/2013  . Gout flare 08/22/2013  . Postoperative wound infection 08/22/2013  . Lumbar degenerative disc disease 08/02/2013  . Post-operative pain 07/29/2013  . Osteonecrosis 05/05/2013  . Small bowel obstruction 04/17/2013  . Acute renal failure 04/17/2013  . Medial meniscus, posterior horn derangement 02/17/2013  . Knee sprain and strain 02/17/2013  . Trigger point with neck pain 03/18/2012  . Rotator cuff tear arthropathy of right shoulder 09/08/2011  . CAD in native artery 01/09/2011  . Ankle fracture 12/25/2010  . Contusion of shoulder, left 12/25/2010  . PEMPHIGUS VULGARIS 07/02/2010  . MRSA 05/13/2010  . PRURITUS 05/13/2010  . SPINAL STENOSIS, LUMBAR 05/13/2010  . HYPERLIPIDEMIA 04/24/2010  . GASTROESOPHAGEAL REFLUX DISEASE 04/24/2010  . VENTRAL HERNIA, INCISIONAL 04/24/2010  . DEGENERATIVE JOINT DISEASE 04/24/2010  . PYOGENIC ARTHRITIS, SHOULDER REGION 02/04/2010  . HYPERTENSION 01/01/2010  . RUPTURE ROTATOR CUFF 02/19/2009   Physical Therapy Progress Note  Dates of Reporting Period: 02/05/15 to 03/14/15  Objective Reports of Subjective Statement: patient appears frustrated with his progress with skilled PT services, also appears to have reduced motivation to continue with therapy   Objective Measurements: see above   Goal Update: see above   Plan: see above   Reason Skilled Services are Required: development of appropriate advanced HEP that will be well tolerated by patient before DC after 1-2 additional  sessions    Nedra Hai PT, DPT [phone]  [facility] [address] [place], [place], [address] Phone: [phone]   Fax:  [phone]

## 2015-03-19 ENCOUNTER — Ambulatory Visit (HOSPITAL_COMMUNITY): Payer: Medicare Other | Admitting: Physical Therapy

## 2015-03-21 ENCOUNTER — Telehealth (HOSPITAL_COMMUNITY): Payer: Self-pay | Admitting: Physical Therapy

## 2015-03-21 ENCOUNTER — Ambulatory Visit (HOSPITAL_COMMUNITY): Payer: Medicare Other | Admitting: Physical Therapy

## 2015-03-21 NOTE — Telephone Encounter (Signed)
Front desk staff called and left patient a message offering an open appointment. If patient does not return phone call today or on 8/25, he is to be discharged as PT and patient had already discussed upcoming DC within 1-2 sessions, and this is 2 consecutive no-shows.  Nedra Hai PT, DPT [phone]

## 2015-03-22 DIAGNOSIS — I10 Essential (primary) hypertension: Secondary | ICD-10-CM | POA: Diagnosis not present

## 2015-03-22 DIAGNOSIS — I2510 Atherosclerotic heart disease of native coronary artery without angina pectoris: Secondary | ICD-10-CM | POA: Diagnosis not present

## 2015-03-22 DIAGNOSIS — M5030 Other cervical disc degeneration, unspecified cervical region: Secondary | ICD-10-CM | POA: Diagnosis not present

## 2015-03-22 DIAGNOSIS — G834 Cauda equina syndrome: Secondary | ICD-10-CM | POA: Diagnosis not present

## 2015-03-26 ENCOUNTER — Ambulatory Visit (HOSPITAL_COMMUNITY): Payer: Medicare Other | Admitting: Physical Therapy

## 2015-03-26 ENCOUNTER — Telehealth (HOSPITAL_COMMUNITY): Payer: Self-pay | Admitting: Physical Therapy

## 2015-03-26 NOTE — Telephone Encounter (Signed)
He stated that the last visit  of 8/17 that the PT told him he had gone a far as he could go and therefore no need to come back.

## 2015-03-28 ENCOUNTER — Encounter (HOSPITAL_COMMUNITY): Payer: Medicare Other | Admitting: Physical Therapy

## 2015-03-30 DIAGNOSIS — R2689 Other abnormalities of gait and mobility: Secondary | ICD-10-CM | POA: Diagnosis not present

## 2015-04-04 DIAGNOSIS — R2689 Other abnormalities of gait and mobility: Secondary | ICD-10-CM | POA: Diagnosis not present

## 2015-04-12 DIAGNOSIS — R2689 Other abnormalities of gait and mobility: Secondary | ICD-10-CM | POA: Diagnosis not present

## 2015-04-13 DIAGNOSIS — M545 Low back pain: Secondary | ICD-10-CM | POA: Diagnosis not present

## 2015-04-13 DIAGNOSIS — M5136 Other intervertebral disc degeneration, lumbar region: Secondary | ICD-10-CM | POA: Diagnosis not present

## 2015-04-13 DIAGNOSIS — Z981 Arthrodesis status: Secondary | ICD-10-CM | POA: Diagnosis not present

## 2015-04-17 DIAGNOSIS — R2689 Other abnormalities of gait and mobility: Secondary | ICD-10-CM | POA: Diagnosis not present

## 2015-04-20 DIAGNOSIS — R2689 Other abnormalities of gait and mobility: Secondary | ICD-10-CM | POA: Diagnosis not present

## 2015-04-26 DIAGNOSIS — Z87440 Personal history of urinary (tract) infections: Secondary | ICD-10-CM | POA: Diagnosis not present

## 2015-04-26 DIAGNOSIS — N401 Enlarged prostate with lower urinary tract symptoms: Secondary | ICD-10-CM | POA: Diagnosis not present

## 2015-04-26 DIAGNOSIS — R339 Retention of urine, unspecified: Secondary | ICD-10-CM | POA: Diagnosis not present

## 2015-04-30 NOTE — Telephone Encounter (Signed)
Called PT RE: missed appointment.  Left message  Virgina Organ, PT CLT [phone]

## 2015-05-03 DIAGNOSIS — R2689 Other abnormalities of gait and mobility: Secondary | ICD-10-CM | POA: Diagnosis not present

## 2015-06-08 DIAGNOSIS — B9629 Other Escherichia coli [E. coli] as the cause of diseases classified elsewhere: Secondary | ICD-10-CM | POA: Diagnosis not present

## 2015-06-08 DIAGNOSIS — R350 Frequency of micturition: Secondary | ICD-10-CM | POA: Diagnosis not present

## 2015-06-08 DIAGNOSIS — N390 Urinary tract infection, site not specified: Secondary | ICD-10-CM | POA: Diagnosis not present

## 2015-06-08 DIAGNOSIS — R351 Nocturia: Secondary | ICD-10-CM | POA: Diagnosis not present

## 2015-06-08 DIAGNOSIS — R300 Dysuria: Secondary | ICD-10-CM | POA: Diagnosis not present

## 2015-06-18 NOTE — Therapy (Signed)
[place], [place], [address] Phone: [phone]   Fax:  [phone]  Patient Details  Name: [name] MRN: [id number] Date of Birth: 10-25-1940 Referring Provider:  Bubba Camp, MD  Encounter Date: 06/18/2015  PHYSICAL THERAPY DISCHARGE SUMMARY  Visits from Start of Care: 19  Current functional level related to goals / functional outcomes: Patient has not returned since last session in August    Remaining deficits: Unable to assess    Education / Equipment: N/A  Plan: Patient agrees to discharge.  Patient goals were not met. Patient is being discharged due to not returning since the last visit.  ?????       Deniece Ree PT, DPT [place] [address] [place], [place], [address] Phone: [phone]   Fax:  [phone]

## 2015-06-19 DIAGNOSIS — N419 Inflammatory disease of prostate, unspecified: Secondary | ICD-10-CM | POA: Diagnosis not present

## 2015-06-19 DIAGNOSIS — Z23 Encounter for immunization: Secondary | ICD-10-CM | POA: Diagnosis not present

## 2015-06-19 DIAGNOSIS — G834 Cauda equina syndrome: Secondary | ICD-10-CM | POA: Diagnosis not present

## 2015-06-19 DIAGNOSIS — I10 Essential (primary) hypertension: Secondary | ICD-10-CM | POA: Diagnosis not present

## 2015-06-19 DIAGNOSIS — I2510 Atherosclerotic heart disease of native coronary artery without angina pectoris: Secondary | ICD-10-CM | POA: Diagnosis not present

## 2015-06-20 IMAGING — CR DG LUMBAR SPINE COMPLETE 4+V
6 series · 6 of 6 positions shown · non-contrast
Comparison: DG ABD 1 VIEW dated 08/08/2013;

CLINICAL DATA: Worsening lower back pain. Status post lumbar
decompression with infection.

EXAM:
LUMBAR SPINE - COMPLETE 4+ VIEW

[t l-spine a.p.]
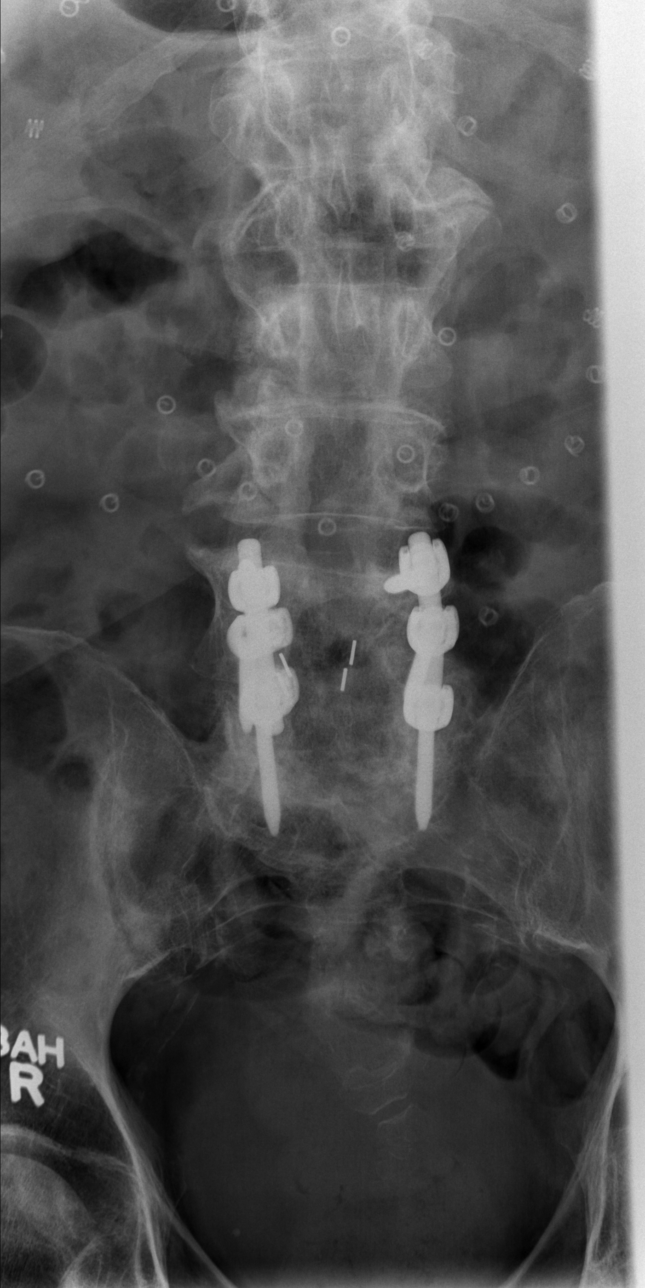

[t l-spine oblique exposure (1 of 2)]
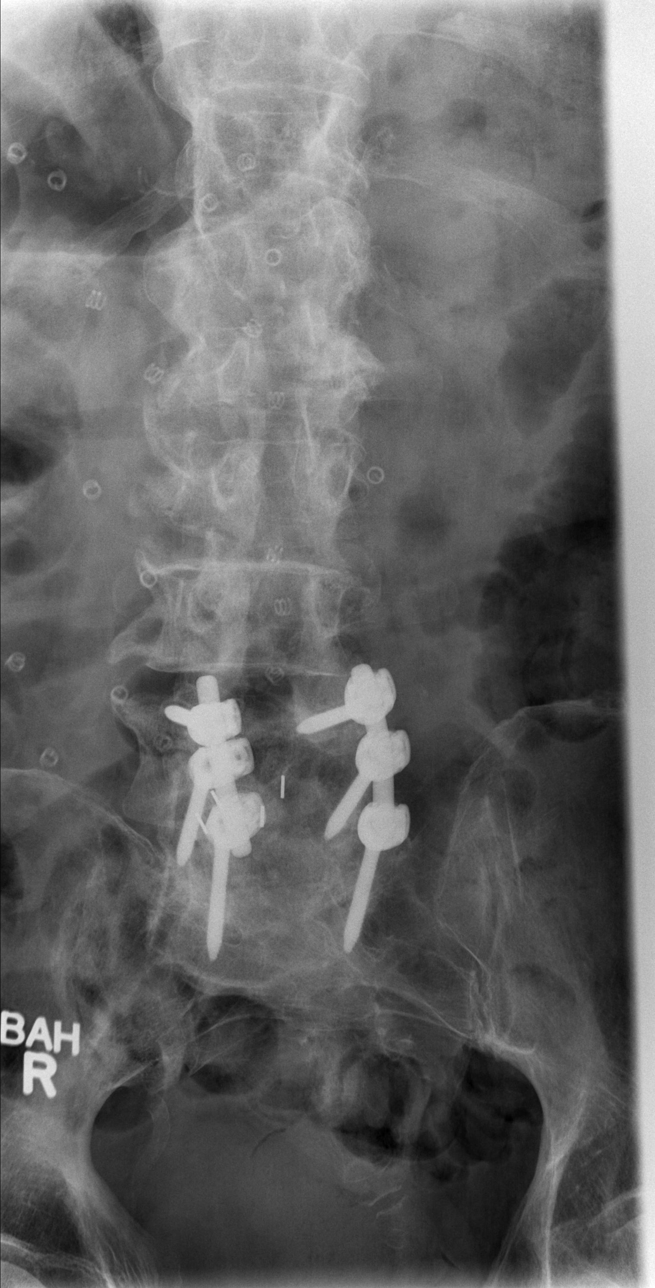

[t l-spine oblique exposure (2 of 2)]
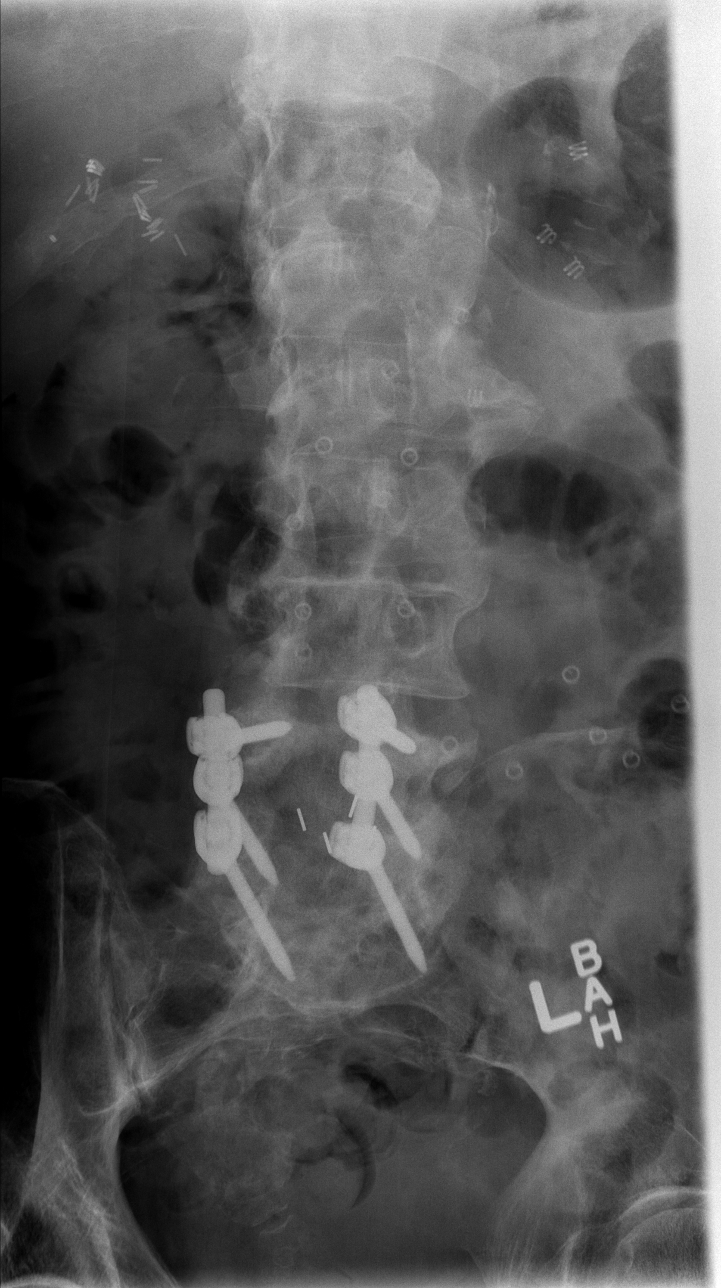

[t l-spine lat (1 of 2)]
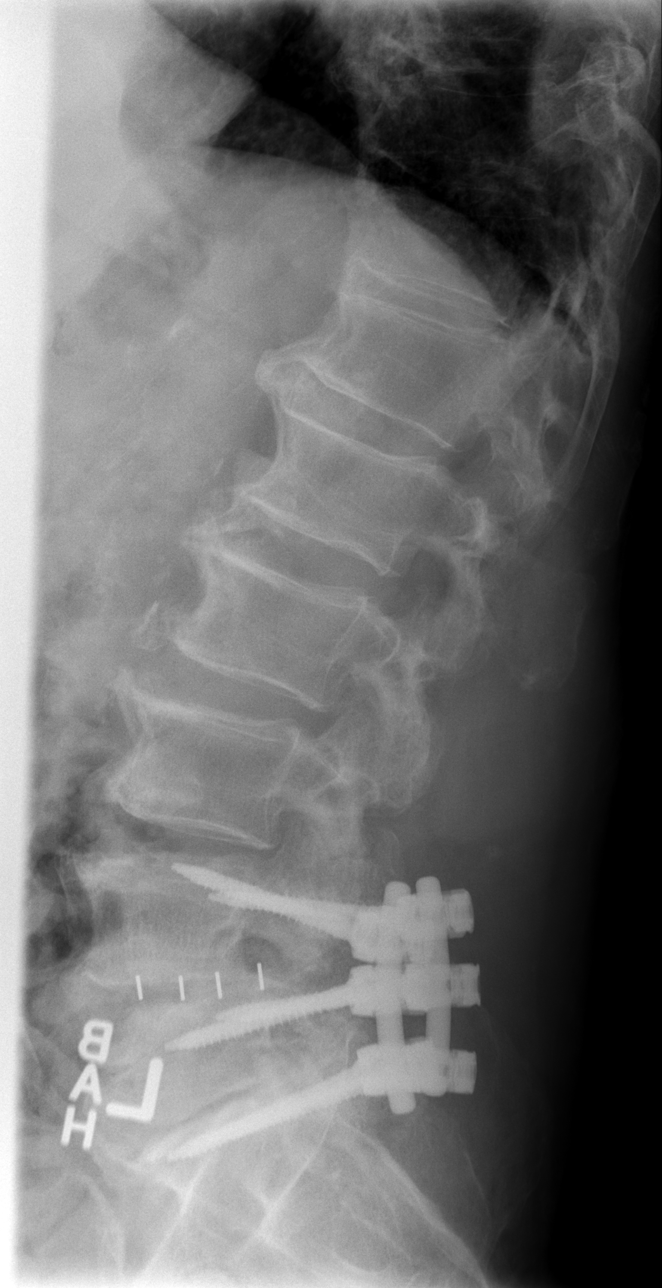

[t l-spine lat (2 of 2)]
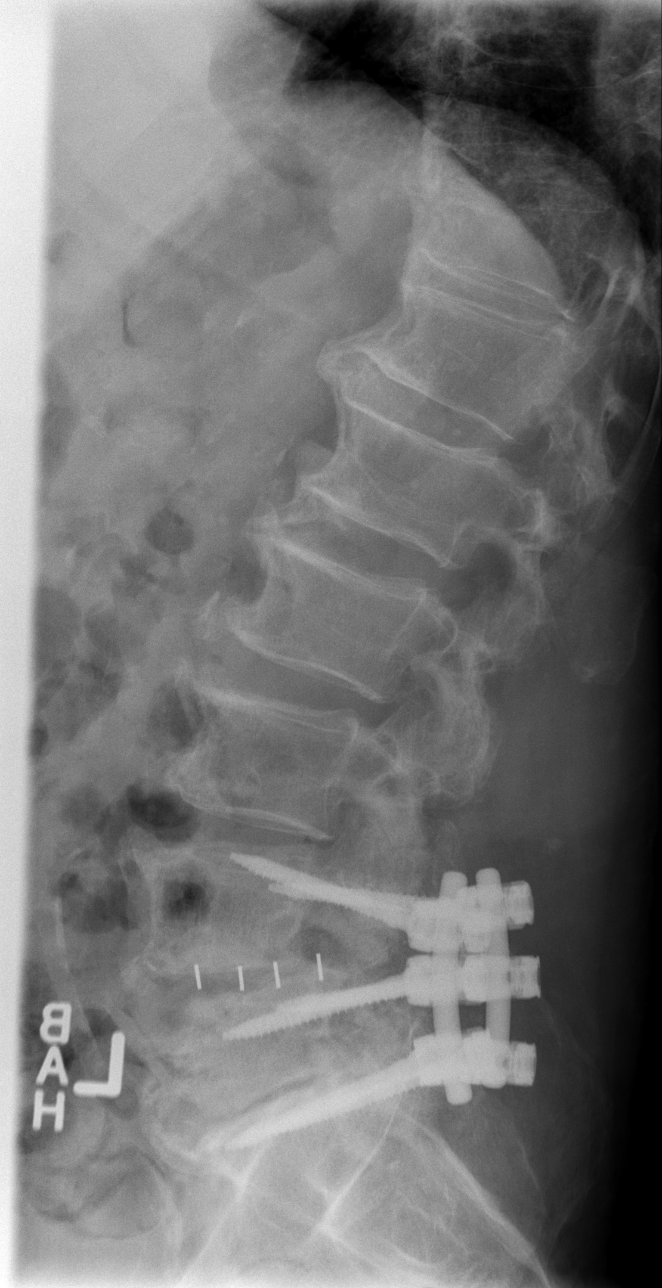

[t l-spine l5-s1 spot]
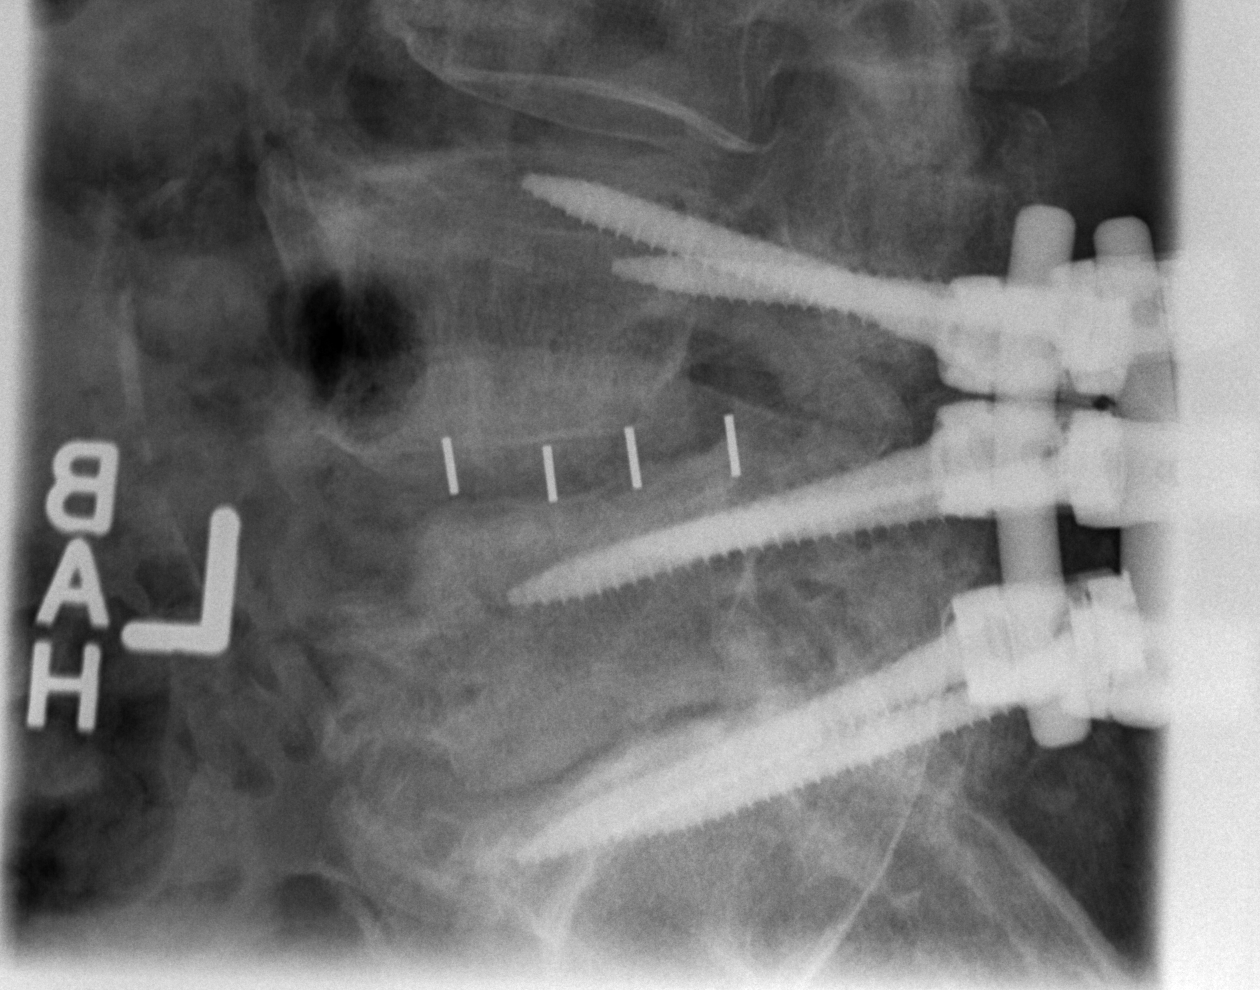

[6 of 6 positions shown; findings below may reference images not displayed]

DG LUMBAR SPINE COMPLETE
dated 08/02/2013; MR MISHUMO SPINE WO/W CM dated 07/29/2013
FINDINGS: The previously noted abscesses are not well characterized on
radiograph. The patient is status post decompression along the lower
lumbar spine; poor definition of the remaining posterior elements is
nonspecific. Lumbar spinal fusion hardware is again noted at L4-S1.
The visualized bony foramina are grossly unremarkable, though not
well assessed at the sites of surgery.

The visualized bowel gas pattern is grossly unremarkable. Abdominal
wall mesh is noted.
IMPRESSION: The remaining posterior elements at the lower lumbar spine are
poorly defined on radiograph; this is nonspecific. Status post
decompression at the lower lumbar spine, with lumbar spinal fusion
at L4-S1. Previously noted epidural abscesses are not well
characterized on radiograph. If the patient's symptoms continue to
worsen, repeat MRI could be considered for further evaluation.

## 2015-06-27 DIAGNOSIS — R351 Nocturia: Secondary | ICD-10-CM | POA: Diagnosis not present

## 2015-06-27 DIAGNOSIS — N390 Urinary tract infection, site not specified: Secondary | ICD-10-CM | POA: Diagnosis not present

## 2015-06-27 DIAGNOSIS — N401 Enlarged prostate with lower urinary tract symptoms: Secondary | ICD-10-CM | POA: Diagnosis not present

## 2015-06-27 DIAGNOSIS — R339 Retention of urine, unspecified: Secondary | ICD-10-CM | POA: Diagnosis not present

## 2015-06-27 DIAGNOSIS — B961 Klebsiella pneumoniae [K. pneumoniae] as the cause of diseases classified elsewhere: Secondary | ICD-10-CM | POA: Diagnosis not present

## 2015-07-11 DIAGNOSIS — M21372 Foot drop, left foot: Secondary | ICD-10-CM | POA: Diagnosis not present

## 2015-07-11 DIAGNOSIS — M5416 Radiculopathy, lumbar region: Secondary | ICD-10-CM | POA: Diagnosis not present

## 2015-09-03 ENCOUNTER — Other Ambulatory Visit: Payer: Self-pay | Admitting: Cardiovascular Disease

## 2015-10-03 ENCOUNTER — Telehealth: Payer: Self-pay | Admitting: Cardiovascular Disease

## 2015-10-03 MED ORDER — CARVEDILOL 6.25 MG PO TABS: 6.2500 mg | ORAL_TABLET | Freq: Two times a day (BID) | ORAL | Status: DC

## 2015-10-03 NOTE — Telephone Encounter (Signed)
Please refill patient's Carvedilol. He has scheduled his follow up. / tg

## 2015-10-11 DIAGNOSIS — G834 Cauda equina syndrome: Secondary | ICD-10-CM | POA: Diagnosis not present

## 2015-10-11 DIAGNOSIS — I10 Essential (primary) hypertension: Secondary | ICD-10-CM | POA: Diagnosis not present

## 2015-10-11 DIAGNOSIS — I2510 Atherosclerotic heart disease of native coronary artery without angina pectoris: Secondary | ICD-10-CM | POA: Diagnosis not present

## 2015-10-11 DIAGNOSIS — M545 Low back pain: Secondary | ICD-10-CM | POA: Diagnosis not present

## 2015-10-17 DIAGNOSIS — M4326 Fusion of spine, lumbar region: Secondary | ICD-10-CM | POA: Diagnosis not present

## 2015-10-17 DIAGNOSIS — M545 Low back pain: Secondary | ICD-10-CM | POA: Diagnosis not present

## 2015-10-17 DIAGNOSIS — Z981 Arthrodesis status: Secondary | ICD-10-CM | POA: Diagnosis not present

## 2015-10-22 ENCOUNTER — Inpatient Hospital Stay (HOSPITAL_COMMUNITY)
Admission: EM | Admit: 2015-10-22 | Discharge: 2015-10-29 | DRG: 552 | Disposition: A | Discharge status: Skilled Nursing Facility | Payer: Medicare Other | Attending: Pulmonary Disease | Admitting: Pulmonary Disease

## 2015-10-22 ENCOUNTER — Emergency Department (HOSPITAL_COMMUNITY): Payer: Medicare Other

## 2015-10-22 ENCOUNTER — Encounter (HOSPITAL_COMMUNITY): Payer: Self-pay | Admitting: *Deleted

## 2015-10-22 DIAGNOSIS — Z7952 Long term (current) use of systemic steroids: Secondary | ICD-10-CM

## 2015-10-22 DIAGNOSIS — M5442 Lumbago with sciatica, left side: Secondary | ICD-10-CM | POA: Diagnosis not present

## 2015-10-22 DIAGNOSIS — M549 Dorsalgia, unspecified: Secondary | ICD-10-CM | POA: Diagnosis not present

## 2015-10-22 DIAGNOSIS — S343XXD Injury of cauda equina, subsequent encounter: Secondary | ICD-10-CM

## 2015-10-22 DIAGNOSIS — G8929 Other chronic pain: Secondary | ICD-10-CM | POA: Diagnosis present

## 2015-10-22 DIAGNOSIS — Z981 Arthrodesis status: Secondary | ICD-10-CM

## 2015-10-22 DIAGNOSIS — N390 Urinary tract infection, site not specified: Secondary | ICD-10-CM | POA: Diagnosis not present

## 2015-10-22 DIAGNOSIS — G834 Cauda equina syndrome: Secondary | ICD-10-CM | POA: Diagnosis not present

## 2015-10-22 DIAGNOSIS — I10 Essential (primary) hypertension: Secondary | ICD-10-CM | POA: Diagnosis present

## 2015-10-22 DIAGNOSIS — E785 Hyperlipidemia, unspecified: Secondary | ICD-10-CM | POA: Diagnosis not present

## 2015-10-22 DIAGNOSIS — S343XXA Injury of cauda equina, initial encounter: Secondary | ICD-10-CM | POA: Diagnosis present

## 2015-10-22 DIAGNOSIS — R200 Anesthesia of skin: Secondary | ICD-10-CM | POA: Diagnosis not present

## 2015-10-22 DIAGNOSIS — K219 Gastro-esophageal reflux disease without esophagitis: Secondary | ICD-10-CM | POA: Diagnosis not present

## 2015-10-22 DIAGNOSIS — Z8249 Family history of ischemic heart disease and other diseases of the circulatory system: Secondary | ICD-10-CM

## 2015-10-22 DIAGNOSIS — Z87891 Personal history of nicotine dependence: Secondary | ICD-10-CM

## 2015-10-22 DIAGNOSIS — M4326 Fusion of spine, lumbar region: Secondary | ICD-10-CM | POA: Diagnosis not present

## 2015-10-22 DIAGNOSIS — M545 Low back pain: Secondary | ICD-10-CM | POA: Diagnosis not present

## 2015-10-22 DIAGNOSIS — B961 Klebsiella pneumoniae [K. pneumoniae] as the cause of diseases classified elsewhere: Secondary | ICD-10-CM | POA: Diagnosis present

## 2015-10-22 DIAGNOSIS — M5116 Intervertebral disc disorders with radiculopathy, lumbar region: Secondary | ICD-10-CM | POA: Diagnosis not present

## 2015-10-22 DIAGNOSIS — M4806 Spinal stenosis, lumbar region: Secondary | ICD-10-CM | POA: Diagnosis present

## 2015-10-22 DIAGNOSIS — I2510 Atherosclerotic heart disease of native coronary artery without angina pectoris: Secondary | ICD-10-CM | POA: Diagnosis present

## 2015-10-22 DIAGNOSIS — Z7982 Long term (current) use of aspirin: Secondary | ICD-10-CM

## 2015-10-22 DIAGNOSIS — T8189XA Other complications of procedures, not elsewhere classified, initial encounter: Secondary | ICD-10-CM | POA: Diagnosis not present

## 2015-10-22 LAB — URINALYSIS, ROUTINE W REFLEX MICROSCOPIC
BILIRUBIN URINE: NEGATIVE
GLUCOSE, UA: NEGATIVE mg/dL
KETONES UR: NEGATIVE mg/dL
Nitrite: NEGATIVE
PROTEIN: NEGATIVE mg/dL
Specific Gravity, Urine: 1.005 (ref 1.005–1.030)
pH: 5 (ref 5.0–8.0)

## 2015-10-22 LAB — CBC WITH DIFFERENTIAL/PLATELET
Basophils Absolute: 0 10*3/uL (ref 0.0–0.1)
Basophils Relative: 0 %
EOS ABS: 0.2 10*3/uL (ref 0.0–0.7)
EOS PCT: 3 %
HCT: 38.1 % — ABNORMAL LOW (ref 39.0–52.0)
Hemoglobin: 12.8 g/dL — ABNORMAL LOW (ref 13.0–17.0)
LYMPHS ABS: 1.4 10*3/uL (ref 0.7–4.0)
LYMPHS PCT: 17 %
MCH: 30.9 pg (ref 26.0–34.0)
MCHC: 33.6 g/dL (ref 30.0–36.0)
MCV: 92 fL (ref 78.0–100.0)
MONO ABS: 0.8 10*3/uL (ref 0.1–1.0)
Monocytes Relative: 10 %
Neutro Abs: 5.7 10*3/uL (ref 1.7–7.7)
Neutrophils Relative %: 70 %
PLATELETS: 190 10*3/uL (ref 150–400)
RBC: 4.14 MIL/uL — ABNORMAL LOW (ref 4.22–5.81)
RDW: 14.1 % (ref 11.5–15.5)
WBC: 8.1 10*3/uL (ref 4.0–10.5)

## 2015-10-22 LAB — BASIC METABOLIC PANEL
Anion gap: 7 (ref 5–15)
BUN: 26 mg/dL — AB (ref 6–20)
CALCIUM: 8.9 mg/dL (ref 8.9–10.3)
CO2: 25 mmol/L (ref 22–32)
CREATININE: 1.03 mg/dL (ref 0.61–1.24)
Chloride: 106 mmol/L (ref 101–111)
GFR calc Af Amer: >60 mL/min (ref >60)
GLUCOSE: 109 mg/dL — AB (ref 65–99)
Potassium: 3.8 mmol/L (ref 3.5–5.1)
SODIUM: 138 mmol/L (ref 135–145)

## 2015-10-22 LAB — URINE MICROSCOPIC-ADD ON

## 2015-10-22 MED ORDER — ADULT MULTIVITAMIN W/MINERALS CH
1.0000 | ORAL_TABLET | Freq: Every day | ORAL | Status: DC
  Administered 2015-10-23 – 2015-10-29 (×7): 1 via ORAL
  Filled 2015-10-22 (×7): qty 1

## 2015-10-22 MED ORDER — ACETAMINOPHEN 325 MG PO TABS: 650.0000 mg | ORAL_TABLET | Freq: Four times a day (QID) | ORAL | Status: DC | PRN

## 2015-10-22 MED ORDER — CARVEDILOL 3.125 MG PO TABS
6.2500 mg | ORAL_TABLET | Freq: Two times a day (BID) | ORAL | Status: DC
  Administered 2015-10-23 – 2015-10-27 (×9): 6.25 mg via ORAL
  Filled 2015-10-22 (×9): qty 2

## 2015-10-22 MED ORDER — OXYCODONE-ACETAMINOPHEN 5-325 MG PO TABS
1.0000 | ORAL_TABLET | ORAL | Status: DC | PRN
  Administered 2015-10-22: 2 via ORAL
  Administered 2015-10-23: 1 via ORAL
  Administered 2015-10-25 – 2015-10-29 (×6): 2 via ORAL
  Filled 2015-10-22 (×3): qty 2
  Filled 2015-10-22: qty 1
  Filled 2015-10-22 (×2): qty 2
  Filled 2015-10-22: qty 1
  Filled 2015-10-22 (×3): qty 2

## 2015-10-22 MED ORDER — SODIUM CHLORIDE 0.9 % IV SOLN
INTRAVENOUS | Status: DC
Start: 2015-10-22 — End: 2015-10-27
  Administered 2015-10-22 – 2015-10-26 (×8): via INTRAVENOUS

## 2015-10-22 MED ORDER — ACETAMINOPHEN 650 MG RE SUPP: 650.0000 mg | Freq: Four times a day (QID) | RECTAL | Status: DC | PRN

## 2015-10-22 MED ORDER — POLYETHYLENE GLYCOL 3350 17 G PO PACK
17.0000 g | PACK | Freq: Every day | ORAL | Status: DC
  Administered 2015-10-23 – 2015-10-28 (×6): 17 g via ORAL
  Filled 2015-10-22 (×7): qty 1

## 2015-10-22 MED ORDER — DOXAZOSIN MESYLATE 2 MG PO TABS
8.0000 mg | ORAL_TABLET | Freq: Every day | ORAL | Status: DC
  Administered 2015-10-23 – 2015-10-29 (×7): 8 mg via ORAL
  Filled 2015-10-22 (×7): qty 4

## 2015-10-22 MED ORDER — DEXAMETHASONE SODIUM PHOSPHATE 4 MG/ML IJ SOLN
10.0000 mg | Freq: Once | INTRAMUSCULAR | Status: AC
  Administered 2015-10-22: 10 mg via INTRAVENOUS
  Filled 2015-10-22: qty 3

## 2015-10-22 MED ORDER — PREDNISONE 10 MG PO TABS
5.0000 mg | ORAL_TABLET | Freq: Every day | ORAL | Status: DC
Start: 2015-10-23 — End: 2015-10-23

## 2015-10-22 MED ORDER — HYDROMORPHONE HCL 1 MG/ML IJ SOLN
1.0000 mg | INTRAMUSCULAR | Status: DC | PRN
  Administered 2015-10-22: 1 mg via INTRAVENOUS
  Filled 2015-10-22: qty 1

## 2015-10-22 MED ORDER — METHOCARBAMOL 1000 MG/10ML IJ SOLN
INTRAMUSCULAR | Status: AC
  Filled 2015-10-22: qty 10

## 2015-10-22 MED ORDER — HYDROMORPHONE HCL 1 MG/ML IJ SOLN
1.0000 mg | Freq: Once | INTRAMUSCULAR | Status: AC
  Administered 2015-10-22: 1 mg via INTRAVENOUS
  Filled 2015-10-22: qty 1

## 2015-10-22 MED ORDER — ONDANSETRON HCL 4 MG PO TABS: 4.0000 mg | ORAL_TABLET | Freq: Four times a day (QID) | ORAL | Status: DC | PRN

## 2015-10-22 MED ORDER — BISACODYL 10 MG RE SUPP
10.0000 mg | Freq: Once | RECTAL | Status: DC
  Filled 2015-10-22 (×2): qty 1

## 2015-10-22 MED ORDER — DOCUSATE SODIUM 100 MG PO CAPS
100.0000 mg | ORAL_CAPSULE | Freq: Every day | ORAL | Status: DC
  Administered 2015-10-23 – 2015-10-29 (×7): 100 mg via ORAL
  Filled 2015-10-22 (×7): qty 1

## 2015-10-22 MED ORDER — HYDROMORPHONE HCL 1 MG/ML IJ SOLN
0.5000 mg | Freq: Once | INTRAMUSCULAR | Status: AC
  Administered 2015-10-22: 0.5 mg via INTRAVENOUS
  Filled 2015-10-22: qty 1

## 2015-10-22 MED ORDER — ASPIRIN EC 81 MG PO TBEC
81.0000 mg | DELAYED_RELEASE_TABLET | Freq: Every day | ORAL | Status: DC
  Administered 2015-10-23 – 2015-10-29 (×7): 81 mg via ORAL
  Filled 2015-10-22 (×7): qty 1

## 2015-10-22 MED ORDER — NITROGLYCERIN 0.4 MG SL SUBL: 0.4000 mg | SUBLINGUAL_TABLET | SUBLINGUAL | Status: DC | PRN

## 2015-10-22 MED ORDER — ATORVASTATIN CALCIUM 40 MG PO TABS
40.0000 mg | ORAL_TABLET | Freq: Every day | ORAL | Status: DC
  Administered 2015-10-23 – 2015-10-29 (×7): 40 mg via ORAL
  Filled 2015-10-22 (×7): qty 1

## 2015-10-22 MED ORDER — HYDROMORPHONE HCL 1 MG/ML IJ SOLN
1.0000 mg | INTRAMUSCULAR | Status: DC | PRN
  Administered 2015-10-22 – 2015-10-23 (×4): 2 mg via INTRAVENOUS
  Administered 2015-10-23: 1 mg via INTRAVENOUS
  Administered 2015-10-23 (×2): 2 mg via INTRAVENOUS
  Administered 2015-10-23: 1 mg via INTRAVENOUS
  Administered 2015-10-24 (×3): 2 mg via INTRAVENOUS
  Administered 2015-10-24: 1 mg via INTRAVENOUS
  Administered 2015-10-24 (×2): 2 mg via INTRAVENOUS
  Administered 2015-10-24 (×2): 1 mg via INTRAVENOUS
  Administered 2015-10-25 (×3): 2 mg via INTRAVENOUS
  Administered 2015-10-25 (×3): 1 mg via INTRAVENOUS
  Administered 2015-10-26 – 2015-10-27 (×11): 2 mg via INTRAVENOUS
  Administered 2015-10-27: 1 mg via INTRAVENOUS
  Administered 2015-10-28 – 2015-10-29 (×10): 2 mg via INTRAVENOUS
  Filled 2015-10-22 (×6): qty 2
  Filled 2015-10-22: qty 1
  Filled 2015-10-22 (×2): qty 2
  Filled 2015-10-22 (×2): qty 1
  Filled 2015-10-22 (×2): qty 2
  Filled 2015-10-22: qty 1
  Filled 2015-10-22 (×2): qty 2
  Filled 2015-10-22 (×2): qty 1
  Filled 2015-10-22 (×3): qty 2
  Filled 2015-10-22 (×2): qty 1
  Filled 2015-10-22 (×20): qty 2
  Filled 2015-10-22: qty 1
  Filled 2015-10-22: qty 2
  Filled 2015-10-22: qty 1
  Filled 2015-10-22: qty 2

## 2015-10-22 MED ORDER — ONDANSETRON HCL 4 MG/2ML IJ SOLN
4.0000 mg | Freq: Four times a day (QID) | INTRAMUSCULAR | Status: DC | PRN
  Administered 2015-10-23 – 2015-10-25 (×5): 4 mg via INTRAVENOUS
  Filled 2015-10-22 (×5): qty 2

## 2015-10-22 MED ORDER — ENOXAPARIN SODIUM 40 MG/0.4ML ~~LOC~~ SOLN
40.0000 mg | SUBCUTANEOUS | Status: DC
  Administered 2015-10-23 – 2015-10-29 (×7): 40 mg via SUBCUTANEOUS
  Filled 2015-10-22 (×7): qty 0.4

## 2015-10-22 MED ORDER — TAMSULOSIN HCL 0.4 MG PO CAPS
0.4000 mg | ORAL_CAPSULE | Freq: Every day | ORAL | Status: DC
  Administered 2015-10-23 – 2015-10-29 (×7): 0.4 mg via ORAL
  Filled 2015-10-22 (×7): qty 1

## 2015-10-22 MED ORDER — METHOCARBAMOL 1000 MG/10ML IJ SOLN
500.0000 mg | Freq: Four times a day (QID) | INTRAVENOUS | Status: DC | PRN
  Administered 2015-10-22 – 2015-10-25 (×3): 500 mg via INTRAVENOUS
  Filled 2015-10-22 (×4): qty 5

## 2015-10-22 MED ORDER — KETOROLAC TROMETHAMINE 30 MG/ML IJ SOLN
15.0000 mg | Freq: Once | INTRAMUSCULAR | Status: AC
  Administered 2015-10-22: 15 mg via INTRAVENOUS
  Filled 2015-10-22: qty 1

## 2015-10-22 MED ORDER — FINASTERIDE 5 MG PO TABS
5.0000 mg | ORAL_TABLET | Freq: Every day | ORAL | Status: DC
  Administered 2015-10-23 – 2015-10-29 (×7): 5 mg via ORAL
  Filled 2015-10-22 (×12): qty 1

## 2015-10-22 MED ORDER — DIAZEPAM 5 MG/ML IJ SOLN
2.5000 mg | Freq: Once | INTRAMUSCULAR | Status: AC
  Administered 2015-10-22: 2.5 mg via INTRAVENOUS
  Filled 2015-10-22: qty 2

## 2015-10-22 NOTE — ED Notes (Addendum)
Pt c/o lower back pain that became worse yesterday, has hx of back problems that required surgery, had xrays performed this past Wednesday for follow up and was told that everything was "fine" pt hypertensive with ems, denies any injury,

## 2015-10-22 NOTE — ED Provider Notes (Signed)
Pt still in significant pain despite meds Medications  HYDROmorphone (DILAUDID) injection 1 mg (not administered)  HYDROmorphone (DILAUDID) injection 0.5 mg (0.5 mg Intravenous Given 10/22/15 0317)  HYDROmorphone (DILAUDID) injection 1 mg (1 mg Intravenous Given 10/22/15 0432)  dexamethasone (DECADRON) injection 10 mg (10 mg Intravenous Given 10/22/15 0622)  diazepam (VALIUM) injection 2.5 mg (2.5 mg Intravenous Given 10/22/15 0622)  HYDROmorphone (DILAUDID) injection 1 mg (1 mg Intravenous Given 10/22/15 0757)  HYDROmorphone (DILAUDID) injection 1 mg (1 mg Intravenous Given 10/22/15 0847)  ketorolac (TORADOL) 30 MG/ML injection 15 mg (15 mg Intravenous Given 10/22/15 0847)   Will admit D/w dr Kerry Houghmemon for triad Admit for dr Juanetta Goslinghawkins   Zadie Rhineonald Matisyn Cabeza, MD 10/22/15 1041

## 2015-10-22 NOTE — ED Notes (Signed)
Pt doing self cath.

## 2015-10-22 NOTE — ED Notes (Signed)
Pt states he has to have something else for pain. States lying on the table for his MRI made his back pain  Worse. EDP aware

## 2015-10-22 NOTE — ED Notes (Signed)
Nurse not available to take report at present

## 2015-10-22 NOTE — ED Notes (Signed)
Pt lying on right talking with visitor. Pt still rates his pain 6/10. Skin warm and dry . Pt alert and oriented

## 2015-10-22 NOTE — ED Notes (Signed)
Tech asked pt if he would be able to ambulate and pt states " I usually walk with a walker but I can't walk im in too much pain."

## 2015-10-22 NOTE — ED Notes (Signed)
Pt ambulated with walker but says was painful.

## 2015-10-22 NOTE — ED Provider Notes (Signed)
MRI shows central disc protrusion Pt still in pain D/w his orthopedic surgeon at [facility] dr Erlene Sentersmelissa erickson We discussed MR findings and his exam He is s/p lumbar fusion She does not recommend emergent operative management She recommends pain control and f/u as outpatient She requests he obtain copy of his MRI  Zadie Rhineonald Chianna Spirito, MD 10/22/15 [phone]

## 2015-10-22 NOTE — ED Provider Notes (Signed)
CSN: [id number]     Arrival date & time 10/22/15  0217 History   First MD Initiated Contact with Patient 10/22/15 [phone]     Chief Complaint  Patient presents with  . Back Pain     (Consider location/radiation/quality/duration/timing/severity/associated sxs/prior Treatment) HPI  This is a 75 year-old male who presents with back pain. Onset of symptoms yesterday. He has an extensive history of back surgeries both at [facility] and at [facility]. 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 Patient states that he normally has well-controlled pain on oxycodone at home. However, he had onset of worsening pain yesterday. It is just left of midline. It is 10 out of 10. It radiates into the left buttock. He reports increasing pain with ambulation. At baseline he ambulates with a walker. He denies any difficulty with his bowel or bladder but states at baseline he sometimes has to in and out cath himself.  He feels like his left thigh is numb. He denies any abdominal pain, nausea, vomiting.  Patient is followed at [facility]. Was seen earlier this week for routine follow-up. Has a history of lumbar fusion. He has at baseline a left foot drop and does in and out caths.  Past Medical History  Diagnosis Date  . Hyperlipidemia   . Small bowel problem     HAD ALOT OF SCAR TISSUE FROM PREVIOUS SURGERIES..NG WAS INSERTED ...Marland KitchenMarland KitchenNO SURGERY NEEDED...Marland KitchenMarland KitchenIN FOR 8 DAYS  . Disorder of blood     BEEN TREATED BY DERMATOLOGIST X 4 YRS..."BLOOD BLISTERS"  . Arthritis   . Gout   . Anxiety   . Hx MRSA infection     rt shoulder  . Pneumonia     "I've had it 3-4 times"  . Coronary artery disease     IN 2000   STENT PLACED IN 2012 sees Dr. Dietrich Pates, saw last 2013  . HTN (hypertension)     sees [name] in [place]  . Small bowel obstruction (HCC)   . Ventral hernia   . Acute renal failure (HCC) 04/17/2013  . 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  Anemia, iron deficiency 09/09/2013   Past Surgical History  Procedure Laterality Date  . Sternal surg  2000    HAS HAD 5-6 ON HIS STERNUM; "caught MRSA in it"  . Lumbar laminectomy/decompression microdiscectomy  06/13/2011    Procedure: LUMBAR LAMINECTOMY/DECOMPRESSION MICRODISCECTOMY;  Surgeon: Karn Cassis;  Location: [facility];  Service: Neurosurgery;  Laterality: N/A;  Lumbar three, lumbar four-five Laminectomy  . Eye surgery      bilateral cataract  . Anterior cervical corpectomy  12/17/11  . Incision and drainage of wound  ~ 20ll; 12/03/11    "had infection in my right"  . Shoulder open rotator cuff repair  ~ 2011    right  . Peripherally inserted central catheter insertion  2011 & 11/2011  . Cataract extraction w/ intraocular lens  implant, bilateral  ? 2011  . Coronary artery bypass graft  2000    CABG X5  . Back surgery      lumbar  . Tonsillectomy  1949  . Cholecystectomy  2006 "or after"  . Coronary angioplasty with stent placement  2012  . Anterior cervical corpectomy  12/17/2011    Procedure: ANTERIOR CERVICAL CORPECTOMY;  Surgeon: Karn Cassis, MD;  Location: [facility];  Service: Neurosurgery;  Laterality: N/A;  Anterior Cervical Decompression Fusion  Five to Thoracic Two with plating  . Lumbar laminectomy/decompression microdiscectomy Right 06/17/2013    Procedure: LUMBAR LAMINECTOMY/DECOMPRESSION MICRODISCECTOMY 1 LEVEL;  Surgeon: Karn CassisErnesto M Botero, MD;  Location: [facility];  Service: Neurosurgery;  Laterality: Right;  Right L3-4 Microdiskectomy  . Lumbar wound debridement N/A 08/02/2013    Procedure: INCISION AND DRAINAGE OF LUMBAR WOUND DEBRIDEMENT;  Surgeon: Karn CassisErnesto M Botero, MD;  Location: [facility];  Service: Neurosurgery;  Laterality: N/A;  . Shoulder arthroscopy Left 09/13/2013    Procedure: ARTHROSCOPIC IRRIGATION AND DEBRIDEMENT, SYNOVECTOMY ,  LEFT SHOULDER ;  Surgeon: Thera FlakeW D Caffrey Jr., MD;  Location: [facility] OR;  Service: Orthopedics;  Laterality: Left;    Family History  Problem Relation Age of Onset  . Heart disease    . Hypertension Mother   . Hypertension Maternal Aunt   . Hypertension Maternal Uncle   . Anesthesia problems Neg Hx   . Hypotension Neg Hx   . Malignant hyperthermia Neg Hx   . Pseudochol deficiency Neg Hx    Social History  Substance Use Topics  . Smoking status: Former Smoker -- 1.00 packs/day for 1 years    Types: Cigarettes    Start date: 12/29/1959    Quit date: 12/27/1998  . Smokeless tobacco: Never Used  . Alcohol Use: No    Review of Systems  Constitutional: Negative for fever.  Respiratory: Negative for shortness of breath.   Cardiovascular: Negative for chest pain.  Gastrointestinal: Negative for nausea, vomiting, abdominal pain and diarrhea.  Genitourinary: Negative for difficulty urinating.  Musculoskeletal: Positive for back pain. Negative for gait problem.  Neurological: Positive for numbness. Negative for weakness.  All other systems reviewed and are negative.     Allergies  Morphine; Metoprolol; and Rifampin  Home Medications   Prior to Admission medications   Medication Sig Start Date End Date Taking? Authorizing Provider  acetaminophen (TYLENOL) 325 MG tablet Take 1-2 tablets (325-650 mg total) by mouth every 4 (four) hours as needed for mild pain. 08/19/13  Yes Evlyn KannerPamela S Love, PA-C  aspirin 81 MG tablet Take 81 mg by mouth daily.   Yes Historical Provider, MD  atorvastatin (LIPITOR) 40 MG tablet TAKE 1 TABLET BY MOUTH EVERY DAY 09/03/15  Yes Laqueta LindenSuresh A Koneswaran, MD  carvedilol (COREG) 6.25 MG tablet Take 1 tablet (6.25 mg total) by mouth 2 (two) times daily. 10/03/15  Yes Laqueta LindenSuresh A Koneswaran, MD  docusate sodium (COLACE) 100 MG capsule Take 100 mg by mouth daily.   Yes Historical Provider, MD  doxazosin (CARDURA) 8 MG tablet Take 8 mg by mouth daily.   Yes Historical Provider, MD  finasteride (PROSCAR) 5 MG tablet Take 5 mg by mouth daily.   Yes Historical Provider, MD  nitroGLYCERIN  (NITROSTAT) 0.4 MG SL tablet Place 0.4 mg under the tongue every 5 (five) minutes as needed for chest pain. May take up to 3 doses per episode. Contact MD or 911 if unresolved chest pain continues. 01/09/11  Yes Jodelle GrossKathryn M Lawrence, NP  predniSONE (DELTASONE) 5 MG tablet Take 5 mg by mouth daily with breakfast.   Yes Historical Provider, MD  tamsulosin (FLOMAX) 0.4 MG CAPS capsule Take 1 capsule (0.4 mg total) by mouth daily after breakfast. 07/08/13  Yes Evlyn KannerPamela S Love, PA-C  oxyCODONE-acetaminophen (PERCOCET) 10-325 MG per tablet Take 1 tablet by mouth every 4 (four) hours as needed for pain.    Historical Provider, MD  vitamin C (ASCORBIC ACID) 500 MG tablet Take 500 mg by mouth daily.  Historical Provider, MD  VITAMIN D, CHOLECALCIFEROL, PO Take 1,000 Units by mouth 2 (two) times daily. For vitamin D deficiency    Historical Provider, MD   BP 137/59 mmHg  Pulse 65  Temp(Src) 97.7 F (36.5 C) (Oral)  Resp 18  Ht 6' (1.829 m)  Wt 180 lb (81.647 kg)  BMI 24.41 kg/m2  SpO2 95% Physical Exam  Constitutional: He is oriented to person, place, and time. He appears well-developed and well-nourished.  Uncomfortable appearing, no acute distress  HENT:  Head: Normocephalic and atraumatic.  Cardiovascular: Normal rate, regular rhythm and normal heart sounds.   No murmur heard. Pulmonary/Chest: Effort normal and breath sounds normal. No respiratory distress. He has no wheezes.  Abdominal: Soft. Bowel sounds are normal. There is no tenderness. There is no rebound.  Musculoskeletal: He exhibits no edema.  Tenderness to palpation over the left SI joint and lower lumbar musculature, negative straight leg raise, baseline foot drop noted left foot, otherwise no acute abnormalities  Neurological: He is alert and oriented to person, place, and time.  Skin: Skin is warm and dry.  Psychiatric: He has a normal mood and affect.  Nursing note and vitals reviewed.   ED Course  Procedures (including critical  care time) Labs Review Labs Reviewed  CBC WITH DIFFERENTIAL/PLATELET - Abnormal; Notable for the following:    RBC 4.14 (*)    Hemoglobin 12.8 (*)    HCT 38.1 (*)    All other components within normal limits  BASIC METABOLIC PANEL - Abnormal; Notable for the following:    Glucose, Bld 109 (*)    BUN 26 (*)    All other components within normal limits  URINALYSIS, ROUTINE W REFLEX MICROSCOPIC (NOT AT [facility]) - Abnormal; Notable for the following:    Hgb urine dipstick TRACE (*)    Leukocytes, UA MODERATE (*)    All other components within normal limits  URINE MICROSCOPIC-ADD ON - Abnormal; Notable for the following:    Squamous Epithelial / LPF 0-5 (*)    Bacteria, UA MANY (*)    All other components within normal limits  URINE CULTURE    Imaging Review Ct Lumbar Spine Wo Contrast  10/22/2015  CLINICAL DATA:  Acute onset of worsening lower back pain. Numbness, burning and pain along the left leg and foot. Initial encounter. EXAM: CT LUMBAR SPINE WITHOUT CONTRAST TECHNIQUE: Multidetector CT imaging of the lumbar spine was performed without intravenous contrast administration. Multiplanar CT image reconstructions were also generated. COMPARISON:  MRI of the lumbar spine performed 09/05/2013 FINDINGS: There is no evidence of fracture or subluxation along the lumbar spine. Vertebral bodies demonstrate normal height. The patient is status post lumbar spinal fusion at L2-S1, with underlying decompression. There is mild grade 1 retrolisthesis of L2 on L3, and disc space narrowing is noted at L4-L5 and L5-S1, with scattered anterior and lateral osteophytes. There is underlying bony foraminal narrowing bilaterally at L5-S1. Diffuse vascular calcifications are seen. The paraspinal musculature appears grossly intact. Vascular perinephric stranding is noted bilaterally. Scattered calcified granulomata are seen within the spleen. IMPRESSION: 1. No acute abnormality seen to explain the patient's symptoms. 2.  Status post lumbar spinal fusion at L2-S1, with underlying decompression. 3. Mild degenerative change along the lumbar spine. 4. Diffuse vascular calcifications seen. Electronically Signed   By: Roanna Raider M.D.   On: 10/22/2015 05:27   I have personally reviewed and evaluated these images and lab results as part of my medical decision-making.   EKG Interpretation  None      MDM   Final diagnoses:  Back pain    Patient presents with back pain. It is reproducible on exam. Extensive history of lower back surgeries and pain at baseline. He also has foot drop and does self cath. Patient was unable to produce urine for [place]. In and out catheter with 900 mL. Patient reports that is more than normal for him. He has required multiple doses of pain medication. His neurological exam appears to be at baseline. It is somewhat limited given his known neurologic deficits.  CT scan is without gross abnormality. Basic lab work is largely reassuring. He does have moderate leuks and many bacteria in his urine. He does not have any urinary symptoms. Urine culture was sent.    Symptoms appear musculoskeletal in origin. He has no abdominal pain and pain is reproducible on exam. Doubt AAA. He denies urinary symptoms but does have some urinary retention. If no other etiology of his acute pain is clear, would treat for urinary tract infection.   5:45 AM On reevaluation, patient continues to endorse pain but has had some improvement of pain. Discussed with the patient staying for MRI to further evaluate. Again at this time it does not appear that he has any new neurologic deficits but given his history, feel it is prudent to rule out cord impingement. No obvious signs of cauda equina at this time. Patient was additionally given Decadron and Valium. MRI ordered. Will sign out to oncoming physician.    Shon Baton, MD 10/22/15 [phone]

## 2015-10-22 NOTE — H&P (Signed)
[facility] History and Physical  [name]:[id number] DOB: October 15, 1940 DOA: 10/22/2015  Referring physician: Dr. Bebe [name], ER PCP: Jake Maudlin, MD   Chief Complaint: back pain  HPI: [name] is a 75 y.o. male who presents to the hospital with complaints of back pain. Patient is a very functional 74 year old gentleman who works out at [employer] regularly and is independent/driving. Patient reports that yesterday while he was laying down he developed left lower back pain. It was shooting down his left leg. He denies any trauma to his back, didn't denies lifting any heavy objects. He did not exert himself out of the ordinary. He has chronic urinary retention and self caths. He also has issues with constipation and often requires suppositories and manual disimpactions. The patient does have chronic back issues and has undergone back surgery in the past at [facility]. Due to intractable pain, he's been referred for admission.   Review of Systems:  Pertinent positives as per HPI, otherwise negative  Past Medical History  Diagnosis Date  . Hyperlipidemia   . Small bowel problem     HAD ALOT OF SCAR TISSUE FROM PREVIOUS SURGERIES..NG WAS INSERTED ...Marland KitchenMarland KitchenNO SURGERY NEEDED...Marland KitchenMarland KitchenIN FOR 8 DAYS  . Disorder of blood     BEEN TREATED BY DERMATOLOGIST X 4 YRS..."BLOOD BLISTERS"  . Arthritis   . Gout   . Anxiety   . Hx MRSA infection     rt shoulder  . Pneumonia     "I've had it 3-4 times"  . Coronary artery disease     IN 2000   STENT PLACED IN 2012 sees Dr. Dietrich Pates, saw last 2013  . HTN (hypertension)     sees [name] in [place]  . Small bowel obstruction (HCC)   . Ventral hernia   . Acute renal failure (HCC) 04/17/2013  . 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 Anemia, iron deficiency 09/09/2013   Past Surgical History  Procedure Laterality Date  . Sternal surg  2000    HAS HAD 5-6 ON HIS STERNUM; "caught MRSA in it"  . Lumbar  laminectomy/decompression microdiscectomy  06/13/2011    Procedure: LUMBAR LAMINECTOMY/DECOMPRESSION MICRODISCECTOMY;  Surgeon: Karn Cassis;  Location: [facility];  Service: Neurosurgery;  Laterality: N/A;  Lumbar three, lumbar four-five Laminectomy  . Eye surgery      bilateral cataract  . Anterior cervical corpectomy  12/17/11  . Incision and drainage of wound  ~ 20ll; 12/03/11    "had infection in my right"  . Shoulder open rotator cuff repair  ~ 2011    right  . Peripherally inserted central catheter insertion  2011 & 11/2011  . Cataract extraction w/ intraocular lens  implant, bilateral  ? 2011  . Coronary artery bypass graft  2000    CABG X5  . Back surgery      lumbar  . Tonsillectomy  1949  . Cholecystectomy  2006 "or after"  . Coronary angioplasty with stent placement  2012  . Anterior cervical corpectomy  12/17/2011    Procedure: ANTERIOR CERVICAL CORPECTOMY;  Surgeon: Karn Cassis, MD;  Location: [facility];  Service: Neurosurgery;  Laterality: N/A;  Anterior Cervical Decompression Fusion Five to Thoracic Two with plating  . Lumbar laminectomy/decompression microdiscectomy Right 06/17/2013    Procedure: LUMBAR LAMINECTOMY/DECOMPRESSION MICRODISCECTOMY 1 LEVEL;  Surgeon: Karn Cassis, MD;  Location: [facility];  Service: Neurosurgery;  Laterality: Right;  Right L3-4 Microdiskectomy  .  Lumbar wound debridement N/A 08/02/2013    Procedure: INCISION AND DRAINAGE OF LUMBAR WOUND DEBRIDEMENT;  Surgeon: Karn CassisErnesto M Botero, MD;  Location: [facility];  Service: Neurosurgery;  Laterality: N/A;  . Shoulder arthroscopy Left 09/13/2013    Procedure: ARTHROSCOPIC IRRIGATION AND DEBRIDEMENT, SYNOVECTOMY ,  LEFT SHOULDER ;  Surgeon: Thera FlakeW D Caffrey Jr., MD;  Location: [facility] OR;  Service: Orthopedics;  Laterality: Left;   Social History:  reports that he quit smoking about 16 years ago. His smoking use included Cigarettes. He started smoking about 55 years ago. He has a 1 pack-year smoking  history. He has never used smokeless tobacco. He reports that he does not drink alcohol or use illicit drugs.  Allergies  Allergen Reactions  . Morphine Other (See Comments)    REACTION: made him go crazy; "I had fun w/it; my family didn't think it was too funny"  . Metoprolol Other (See Comments)    REACTION:  Tachycardia; "don't remember how bad it was; it was so long ago"  . Rifampin     Itch     Family History  Problem Relation Age of Onset  . Heart disease    . Hypertension Mother   . Hypertension Maternal Aunt   . Hypertension Maternal Uncle   . Anesthesia problems Neg Hx   . Hypotension Neg Hx   . Malignant hyperthermia Neg Hx   . Pseudochol deficiency Neg Hx     Prior to Admission medications   Medication Sig Start Date End Date Taking? Authorizing Provider  acetaminophen (TYLENOL) 325 MG tablet Take 1-2 tablets (325-650 mg total) by mouth every 4 (four) hours as needed for mild pain. 08/19/13  Yes Evlyn KannerPamela S Love, PA-C  aspirin EC 81 MG tablet Take 81 mg by mouth daily.   Yes Historical Provider, MD  atorvastatin (LIPITOR) 40 MG tablet TAKE 1 TABLET BY MOUTH EVERY DAY 09/03/15  Yes Laqueta LindenSuresh A Koneswaran, MD  carvedilol (COREG) 6.25 MG tablet Take 1 tablet (6.25 mg total) by mouth 2 (two) times daily. 10/03/15  Yes Laqueta LindenSuresh A Koneswaran, MD  docusate sodium (COLACE) 100 MG capsule Take 100 mg by mouth daily.   Yes Historical Provider, MD  doxazosin (CARDURA) 8 MG tablet Take 8 mg by mouth daily.   Yes Historical Provider, MD  finasteride (PROSCAR) 5 MG tablet Take 5 mg by mouth daily.   Yes Historical Provider, MD  Multiple Vitamin (MULTIVITAMIN) tablet Take 1 tablet by mouth daily.   Yes Historical Provider, MD  nitroGLYCERIN (NITROSTAT) 0.4 MG SL tablet Place 0.4 mg under the tongue every 5 (five) minutes as needed for chest pain. May take up to 3 doses per episode. Contact MD or 911 if unresolved chest pain continues. 01/09/11  Yes Jodelle GrossKathryn M Lawrence, NP  oxyCODONE-acetaminophen  (PERCOCET/ROXICET) 5-325 MG tablet Take 1 tablet by mouth 4 (four) times daily as needed. pain 10/16/15  Yes Historical Provider, MD  predniSONE (DELTASONE) 5 MG tablet Take 5 mg by mouth daily with breakfast.   Yes Historical Provider, MD  tamsulosin (FLOMAX) 0.4 MG CAPS capsule Take 1 capsule (0.4 mg total) by mouth daily after breakfast. 07/08/13  Yes Jacquelynn CreePamela S Love, PA-C   Physical Exam: Filed Vitals:   10/22/15 0629 10/22/15 0846 10/22/15 1154 10/22/15 1334  BP: 131/57 180/74 158/68 146/64  Pulse: 66 70 79 74  Temp: 97.5 F (36.4 C)   98.4 F (36.9 C)  TempSrc: Oral   Oral  Resp: 18 18 18 20   Height:  Weight:      SpO2: 93% 98% 95% 97%    Wt Readings from Last 3 Encounters:  10/22/15 81.647 kg (180 lb)  11/17/14 77.111 kg (170 lb)  10/11/14 79.379 kg (175 lb)    General:  Appears calm and comfortable Eyes: PERRL, normal lids, irises & conjunctiva ENT: grossly normal hearing, lips & tongue Neck: no LAD, masses or thyromegaly Cardiovascular: RRR, no m/r/g. No LE edema. Telemetry: SR, no arrhythmias  Respiratory: CTA bilaterally, no w/r/r. Normal respiratory effort. Abdomen: soft, ntnd Skin: no rash or induration seen on limited exam Musculoskeletal: tenderness to palpation over left lower back and left SI Psychiatric: grossly normal mood and affect, speech fluent and appropriate Neurologic: grossly non-focal.          Labs on Admission:  Basic Metabolic Panel:  Recent Labs Lab 10/22/15 0318  NA 138  K 3.8  CL 106  CO2 25  GLUCOSE 109*  BUN 26*  CREATININE 1.03  CALCIUM 8.9   Liver Function Tests: No results for input(s): AST, ALT, ALKPHOS, BILITOT, PROT, ALBUMIN in the last 168 hours. No results for input(s): LIPASE, AMYLASE in the last 168 hours. No results for input(s): AMMONIA in the last 168 hours. CBC:  Recent Labs Lab 10/22/15 0318  WBC 8.1  NEUTROABS 5.7  HGB 12.8*  HCT 38.1*  MCV 92.0  PLT 190   Cardiac Enzymes: No results for  input(s): CKTOTAL, CKMB, CKMBINDEX, TROPONINI in the last 168 hours.  BNP (last 3 results) No results for input(s): BNP in the last 8760 hours.  ProBNP (last 3 results) No results for input(s): PROBNP in the last 8760 hours.  CBG: No results for input(s): GLUCAP in the last 168 hours.  Radiological Exams on Admission: Ct Lumbar Spine Wo Contrast  10/22/2015  CLINICAL DATA:  Acute onset of worsening lower back pain. Numbness, burning and pain along the left leg and foot. Initial encounter. EXAM: CT LUMBAR SPINE WITHOUT CONTRAST TECHNIQUE: Multidetector CT imaging of the lumbar spine was performed without intravenous contrast administration. Multiplanar CT image reconstructions were also generated. COMPARISON:  MRI of the lumbar spine performed 09/05/2013 FINDINGS: There is no evidence of fracture or subluxation along the lumbar spine. Vertebral bodies demonstrate normal height. The patient is status post lumbar spinal fusion at L2-S1, with underlying decompression. There is mild grade 1 retrolisthesis of L2 on L3, and disc space narrowing is noted at L4-L5 and L5-S1, with scattered anterior and lateral osteophytes. There is underlying bony foraminal narrowing bilaterally at L5-S1. Diffuse vascular calcifications are seen. The paraspinal musculature appears grossly intact. Vascular perinephric stranding is noted bilaterally. Scattered calcified granulomata are seen within the spleen. IMPRESSION: 1. No acute abnormality seen to explain the patient's symptoms. 2. Status post lumbar spinal fusion at L2-S1, with underlying decompression. 3. Mild degenerative change along the lumbar spine. 4. Diffuse vascular calcifications seen. Electronically Signed   By: Roanna Raider M.D.   On: 10/22/2015 05:27   Mr Lumbar Spine Wo Contrast  10/22/2015  CLINICAL DATA:  Severe low back pain. Lumbar surgery x 3. The examination had to be discontinued prior to completion due to patient refusal for further imaging. EXAM:  MRI LUMBAR SPINE WITHOUT CONTRAST TECHNIQUE: Multiplanar, multisequence MR imaging of the lumbar spine was performed. No intravenous contrast was administered. COMPARISON:  CT of the lumbar spine 10/22/2015. MRI of the lumbar spine 09/05/2013. FINDINGS: Study is moderately degraded by patient motion. Normal signal is present in the conus medullaris which terminates at L1,  within normal limits. Pedicle screw and rod fixation is evident L2-S1. Bilateral pedicle screws are present with the exception of L4 on the right. Fusion at L4-5 is stable. There is some exposure of the disc spacer on the left, unchanged. The axial images are nondiagnostic. No significant interval change is a prominent inferior disc extrusion at L2-3. This extends 13 mm below the superior endplate of L3. Moderate central and bilateral foraminal stenosis results. Severe left and moderate right foraminal stenosis is again noted L3-4. Moderate left foraminal stenosis is again noted at L4-5 and L5-S1. The L1-2 level is unremarkable. IMPRESSION: 1. New inferior disc extrusion centrally at L2-3 resulting in moderate central and bilateral foraminal stenosis, new since prior exam. 2. Moderate to severe left foraminal stenosis at L3-4 and L4-5. Moderate left foraminal narrowing at L5-S1. 3. Posterior pedicle screw and rod fixation L2-S1. Electronically Signed   By: Marin Roberts M.D.   On: 10/22/2015 07:35    Assessment/Plan Active Problems:   HLD (hyperlipidemia)   Essential hypertension   GASTROESOPHAGEAL REFLUX DISEASE   Intractable back pain   Back pain   Back pain. Patient had MRI of his lumbar spine that showed New inferior disc extrusion centrally at L2-3 resulting in moderate central and bilateral foraminal stenosis, new since prior exam. Case was discussed by EDP with his primary orthopedic surgeon at [facility] and MRI was reviewed. Recommendations were for pain management at this time with outpatient follow-up. It was not  felt the patient needed any emergent surgery at this time. Start the patient on narcotic pain medicine as well as muscle relaxants. Physical therapy consultation  GERD. Continue PPI  Hypertension. Continue outpatient regimen  Hyperlipidemia. Continue statin  Code Status: full code DVT Prophylaxis: lovenox Family Communication: discussed with patient Disposition Plan: discharge home once improved  Time spent:  [facility] Pager [phone]

## 2015-10-23 DIAGNOSIS — S343XXD Injury of cauda equina, subsequent encounter: Secondary | ICD-10-CM | POA: Diagnosis not present

## 2015-10-23 DIAGNOSIS — D509 Iron deficiency anemia, unspecified: Secondary | ICD-10-CM | POA: Diagnosis not present

## 2015-10-23 DIAGNOSIS — M5116 Intervertebral disc disorders with radiculopathy, lumbar region: Secondary | ICD-10-CM | POA: Diagnosis present

## 2015-10-23 DIAGNOSIS — M549 Dorsalgia, unspecified: Secondary | ICD-10-CM | POA: Diagnosis not present

## 2015-10-23 DIAGNOSIS — R29898 Other symptoms and signs involving the musculoskeletal system: Secondary | ICD-10-CM | POA: Diagnosis not present

## 2015-10-23 DIAGNOSIS — M545 Low back pain: Secondary | ICD-10-CM | POA: Diagnosis not present

## 2015-10-23 DIAGNOSIS — K219 Gastro-esophageal reflux disease without esophagitis: Secondary | ICD-10-CM | POA: Diagnosis present

## 2015-10-23 DIAGNOSIS — G834 Cauda equina syndrome: Secondary | ICD-10-CM | POA: Diagnosis present

## 2015-10-23 DIAGNOSIS — Z8249 Family history of ischemic heart disease and other diseases of the circulatory system: Secondary | ICD-10-CM | POA: Diagnosis not present

## 2015-10-23 DIAGNOSIS — G8929 Other chronic pain: Secondary | ICD-10-CM | POA: Diagnosis present

## 2015-10-23 DIAGNOSIS — K592 Neurogenic bowel, not elsewhere classified: Secondary | ICD-10-CM | POA: Diagnosis not present

## 2015-10-23 DIAGNOSIS — M6281 Muscle weakness (generalized): Secondary | ICD-10-CM | POA: Diagnosis not present

## 2015-10-23 DIAGNOSIS — R262 Difficulty in walking, not elsewhere classified: Secondary | ICD-10-CM | POA: Diagnosis not present

## 2015-10-23 DIAGNOSIS — M4806 Spinal stenosis, lumbar region: Secondary | ICD-10-CM | POA: Diagnosis present

## 2015-10-23 DIAGNOSIS — M1990 Unspecified osteoarthritis, unspecified site: Secondary | ICD-10-CM | POA: Diagnosis not present

## 2015-10-23 DIAGNOSIS — Z7952 Long term (current) use of systemic steroids: Secondary | ICD-10-CM | POA: Diagnosis not present

## 2015-10-23 DIAGNOSIS — Z981 Arthrodesis status: Secondary | ICD-10-CM | POA: Diagnosis not present

## 2015-10-23 DIAGNOSIS — R278 Other lack of coordination: Secondary | ICD-10-CM | POA: Diagnosis not present

## 2015-10-23 DIAGNOSIS — I10 Essential (primary) hypertension: Secondary | ICD-10-CM | POA: Diagnosis present

## 2015-10-23 DIAGNOSIS — I2510 Atherosclerotic heart disease of native coronary artery without angina pectoris: Secondary | ICD-10-CM | POA: Diagnosis present

## 2015-10-23 DIAGNOSIS — N390 Urinary tract infection, site not specified: Secondary | ICD-10-CM | POA: Diagnosis present

## 2015-10-23 DIAGNOSIS — Z7982 Long term (current) use of aspirin: Secondary | ICD-10-CM | POA: Diagnosis not present

## 2015-10-23 DIAGNOSIS — E785 Hyperlipidemia, unspecified: Secondary | ICD-10-CM | POA: Diagnosis present

## 2015-10-23 DIAGNOSIS — Z87891 Personal history of nicotine dependence: Secondary | ICD-10-CM | POA: Diagnosis not present

## 2015-10-23 DIAGNOSIS — B961 Klebsiella pneumoniae [K. pneumoniae] as the cause of diseases classified elsewhere: Secondary | ICD-10-CM | POA: Diagnosis present

## 2015-10-23 LAB — BASIC METABOLIC PANEL
ANION GAP: 10 (ref 5–15)
BUN: 36 mg/dL — ABNORMAL HIGH (ref 6–20)
CHLORIDE: 102 mmol/L (ref 101–111)
CO2: 28 mmol/L (ref 22–32)
CREATININE: 1.18 mg/dL (ref 0.61–1.24)
Calcium: 9.1 mg/dL (ref 8.9–10.3)
GFR calc non Af Amer: 60 mL/min — ABNORMAL LOW (ref >60)
Glucose, Bld: 106 mg/dL — ABNORMAL HIGH (ref 65–99)
POTASSIUM: 4.3 mmol/L (ref 3.5–5.1)
SODIUM: 140 mmol/L (ref 135–145)

## 2015-10-23 LAB — GLUCOSE, CAPILLARY
GLUCOSE-CAPILLARY: 119 mg/dL — AB (ref 65–99)
GLUCOSE-CAPILLARY: 129 mg/dL — AB (ref 65–99)
Glucose-Capillary: 186 mg/dL — ABNORMAL HIGH (ref 65–99)

## 2015-10-23 LAB — CBC
HEMATOCRIT: 39 % (ref 39.0–52.0)
HEMOGLOBIN: 12.9 g/dL — AB (ref 13.0–17.0)
MCH: 30.7 pg (ref 26.0–34.0)
MCHC: 33.1 g/dL (ref 30.0–36.0)
MCV: 92.9 fL (ref 78.0–100.0)
Platelets: 212 10*3/uL (ref 150–400)
RBC: 4.2 MIL/uL — AB (ref 4.22–5.81)
RDW: 14.2 % (ref 11.5–15.5)
WBC: 10.8 10*3/uL — AB (ref 4.0–10.5)

## 2015-10-23 MED ORDER — METHYLPREDNISOLONE SODIUM SUCC 125 MG IJ SOLR
125.0000 mg | Freq: Four times a day (QID) | INTRAMUSCULAR | Status: DC
  Administered 2015-10-23 – 2015-10-26 (×13): 125 mg via INTRAVENOUS
  Filled 2015-10-23 (×12): qty 2

## 2015-10-23 MED ORDER — INSULIN ASPART 100 UNIT/ML ~~LOC~~ SOLN
0.0000 [IU] | Freq: Three times a day (TID) | SUBCUTANEOUS | Status: DC
  Administered 2015-10-23 – 2015-10-24 (×4): 3 [IU] via SUBCUTANEOUS
  Administered 2015-10-25: 5 [IU] via SUBCUTANEOUS
  Administered 2015-10-25 – 2015-10-27 (×5): 2 [IU] via SUBCUTANEOUS
  Administered 2015-10-28 (×3): 3 [IU] via SUBCUTANEOUS

## 2015-10-23 MED ORDER — DEXTROSE 5 % IV SOLN
1.0000 g | INTRAVENOUS | Status: DC
  Administered 2015-10-23 – 2015-10-29 (×7): 1 g via INTRAVENOUS
  Filled 2015-10-23 (×8): qty 10

## 2015-10-23 NOTE — Progress Notes (Signed)
Subjective: He is here with intractable back pain. He is known to have had multiple surgeries on his back. He has cauda equina syndrome which I think is from a hematoma after one of his surgeries. He is doing better with that after another surgery done at [facility]. He just saw his neurosurgeon at [facility] and no further surgery has been recommended. His MRI shows a new ruptured disc plus some spinal stenosis plus some facet joint arthritis. I think he also has a urinary tract infection. He has urinary incontinence and retention and self catheterizes at home.  Objective: Vital signs in last 24 hours: Temp:  [97.4 F (36.3 C)-98.4 F (36.9 C)] 97.4 F (36.3 C) (03/28 0500) Pulse Rate:  [67-79] 67 (03/28 0500) Resp:  [18-20] 20 (03/28 0500) BP: (138-180)/(59-74) 172/59 mmHg (03/28 0500) SpO2:  [95 %-100 %] 100 % (03/28 0500) Weight change:  Last BM Date: 10/20/15  Intake/Output from previous day: 03/27 0701 - 03/28 0700 In: 240 [P.O.:240] Out: -   PHYSICAL EXAM General appearance: alert, cooperative and moderate distress Resp: clear to auscultation bilaterally Cardio: regular rate and rhythm, S1, S2 normal, no murmur, click, rub or gallop GI: soft, non-tender; bowel sounds normal; no masses,  no organomegaly Extremities: venous stasis dermatitis noted  Lab Results:  Results for orders placed or performed during the hospital encounter of 10/22/15 (from the past 48 hour(s))  CBC with Differential     Status: Abnormal   Collection Time: 10/22/15  3:18 AM  Result Value Ref Range   WBC 8.1 4.0 - 10.5 K/uL   RBC 4.14 (L) 4.22 - 5.81 MIL/uL   Hemoglobin 12.8 (L) 13.0 - 17.0 g/dL   HCT 95.7 (L) 90.0 - 92.0 %   MCV 92.0 78.0 - 100.0 fL   MCH 30.9 26.0 - 34.0 pg   MCHC 33.6 30.0 - 36.0 g/dL   RDW 04.1 59.3 - 01.2 %   Platelets 190 150 - 400 K/uL   Neutrophils Relative % 70 %   Neutro Abs 5.7 1.7 - 7.7 K/uL   Lymphocytes Relative 17 %   Lymphs Abs 1.4 0.7 - 4.0 K/uL   Monocytes Relative 10 %    Monocytes Absolute 0.8 0.1 - 1.0 K/uL   Eosinophils Relative 3 %   Eosinophils Absolute 0.2 0.0 - 0.7 K/uL   Basophils Relative 0 %   Basophils Absolute 0.0 0.0 - 0.1 K/uL  Basic metabolic panel     Status: Abnormal   Collection Time: 10/22/15  3:18 AM  Result Value Ref Range   Sodium 138 135 - 145 mmol/L   Potassium 3.8 3.5 - 5.1 mmol/L   Chloride 106 101 - 111 mmol/L   CO2 25 22 - 32 mmol/L   Glucose, Bld 109 (H) 65 - 99 mg/dL   BUN 26 (H) 6 - 20 mg/dL   Creatinine, Ser 3.79 0.61 - 1.24 mg/dL   Calcium 8.9 8.9 - 90.9 mg/dL   GFR calc non Af Amer >60 >60 mL/min   GFR calc Af Amer >60 >60 mL/min    Comment: (NOTE) The eGFR has been calculated using the CKD EPI equation. This calculation has not been validated in all clinical situations. eGFR's persistently <60 mL/min signify possible Chronic Kidney Disease.    Anion gap 7 5 - 15  Urinalysis, Routine w reflex microscopic (not at [facility])     Status: Abnormal   Collection Time: 10/22/15  4:47 AM  Result Value Ref Range   Color, Urine YELLOW YELLOW  APPearance CLEAR CLEAR   Specific Gravity, Urine 1.005 1.005 - 1.030   pH 5.0 5.0 - 8.0   Glucose, UA NEGATIVE NEGATIVE mg/dL   Hgb urine dipstick TRACE (A) NEGATIVE   Bilirubin Urine NEGATIVE NEGATIVE   Ketones, ur NEGATIVE NEGATIVE mg/dL   Protein, ur NEGATIVE NEGATIVE mg/dL   Nitrite NEGATIVE NEGATIVE   Leukocytes, UA MODERATE (A) NEGATIVE  Urine microscopic-add on     Status: Abnormal   Collection Time: 10/22/15  4:47 AM  Result Value Ref Range   Squamous Epithelial / LPF 0-5 (A) NONE SEEN   WBC, UA 6-30 0 - 5 WBC/hpf   RBC / HPF 0-5 0 - 5 RBC/hpf   Bacteria, UA MANY (A) NONE SEEN  CBC     Status: Abnormal   Collection Time: 10/23/15  6:41 AM  Result Value Ref Range   WBC 10.8 (H) 4.0 - 10.5 K/uL   RBC 4.20 (L) 4.22 - 5.81 MIL/uL   Hemoglobin 12.9 (L) 13.0 - 17.0 g/dL   HCT 73.3 10.7 - 88.1 %   MCV 92.9 78.0 - 100.0 fL   MCH 30.7 26.0 - 34.0 pg   MCHC 33.1 30.0 -  36.0 g/dL   RDW 17.9 30.5 - 01.5 %   Platelets 212 150 - 400 K/uL  Basic metabolic panel     Status: Abnormal   Collection Time: 10/23/15  6:41 AM  Result Value Ref Range   Sodium 140 135 - 145 mmol/L   Potassium 4.3 3.5 - 5.1 mmol/L   Chloride 102 101 - 111 mmol/L   CO2 28 22 - 32 mmol/L   Glucose, Bld 106 (H) 65 - 99 mg/dL   BUN 36 (H) 6 - 20 mg/dL   Creatinine, Ser 9.91 0.61 - 1.24 mg/dL   Calcium 9.1 8.9 - 48.4 mg/dL   GFR calc non Af Amer 60 (L) >60 mL/min   GFR calc Af Amer >60 >60 mL/min    Comment: (NOTE) The eGFR has been calculated using the CKD EPI equation. This calculation has not been validated in all clinical situations. eGFR's persistently <60 mL/min signify possible Chronic Kidney Disease.    Anion gap 10 5 - 15    ABGS No results for input(s): PHART, PO2ART, TCO2, HCO3 in the last 72 hours.  Invalid input(s): PCO2 CULTURES No results found for this or any previous visit (from the past 240 hour(s)). Studies/Results: Ct Lumbar Spine Wo Contrast  10/22/2015  CLINICAL DATA:  Acute onset of worsening lower back pain. Numbness, burning and pain along the left leg and foot. Initial encounter. EXAM: CT LUMBAR SPINE WITHOUT CONTRAST TECHNIQUE: Multidetector CT imaging of the lumbar spine was performed without intravenous contrast administration. Multiplanar CT image reconstructions were also generated. COMPARISON:  MRI of the lumbar spine performed 09/05/2013 FINDINGS: There is no evidence of fracture or subluxation along the lumbar spine. Vertebral bodies demonstrate normal height. The patient is status post lumbar spinal fusion at L2-S1, with underlying decompression. There is mild grade 1 retrolisthesis of L2 on L3, and disc space narrowing is noted at L4-L5 and L5-S1, with scattered anterior and lateral osteophytes. There is underlying bony foraminal narrowing bilaterally at L5-S1. Diffuse vascular calcifications are seen. The paraspinal musculature appears grossly  intact. Vascular perinephric stranding is noted bilaterally. Scattered calcified granulomata are seen within the spleen. IMPRESSION: 1. No acute abnormality seen to explain the patient's symptoms. 2. Status post lumbar spinal fusion at L2-S1, with underlying decompression. 3. Mild degenerative change along the  lumbar spine. 4. Diffuse vascular calcifications seen. Electronically Signed   By: Garald Balding M.D.   On: 10/22/2015 05:27   Mr Lumbar Spine Wo Contrast  10/22/2015  CLINICAL DATA:  Severe low back pain. Lumbar surgery x 3. The examination had to be discontinued prior to completion due to patient refusal for further imaging. EXAM: MRI LUMBAR SPINE WITHOUT CONTRAST TECHNIQUE: Multiplanar, multisequence MR imaging of the lumbar spine was performed. No intravenous contrast was administered. COMPARISON:  CT of the lumbar spine 10/22/2015. MRI of the lumbar spine 09/05/2013. FINDINGS: Study is moderately degraded by patient motion. Normal signal is present in the conus medullaris which terminates at L1, within normal limits. Pedicle screw and rod fixation is evident L2-S1. Bilateral pedicle screws are present with the exception of L4 on the right. Fusion at L4-5 is stable. There is some exposure of the disc spacer on the left, unchanged. The axial images are nondiagnostic. No significant interval change is a prominent inferior disc extrusion at L2-3. This extends 13 mm below the superior endplate of L3. Moderate central and bilateral foraminal stenosis results. Severe left and moderate right foraminal stenosis is again noted L3-4. Moderate left foraminal stenosis is again noted at L4-5 and L5-S1. The L1-2 level is unremarkable. IMPRESSION: 1. New inferior disc extrusion centrally at L2-3 resulting in moderate central and bilateral foraminal stenosis, new since prior exam. 2. Moderate to severe left foraminal stenosis at L3-4 and L4-5. Moderate left foraminal narrowing at L5-S1. 3. Posterior pedicle screw and  rod fixation L2-S1. Electronically Signed   By: San Morelle M.D.   On: 10/22/2015 07:35    Medications:  Prior to Admission:  Prescriptions prior to admission  Medication Sig Dispense Refill Last Dose  . acetaminophen (TYLENOL) 325 MG tablet Take 1-2 tablets (325-650 mg total) by mouth every 4 (four) hours as needed for mild pain.   unknown  . aspirin EC 81 MG tablet Take 81 mg by mouth daily.   10/21/2015 at Unknown time  . atorvastatin (LIPITOR) 40 MG tablet TAKE 1 TABLET BY MOUTH EVERY DAY 30 tablet 6 10/21/2015 at Unknown time  . carvedilol (COREG) 6.25 MG tablet Take 1 tablet (6.25 mg total) by mouth 2 (two) times daily. 180 tablet 3 10/21/2015 at 800  . docusate sodium (COLACE) 100 MG capsule Take 100 mg by mouth daily.   10/21/2015 at Unknown time  . doxazosin (CARDURA) 8 MG tablet Take 8 mg by mouth daily.   10/21/2015 at Unknown time  . finasteride (PROSCAR) 5 MG tablet Take 5 mg by mouth daily.   10/21/2015 at Unknown time  . Multiple Vitamin (MULTIVITAMIN) tablet Take 1 tablet by mouth daily.   10/21/2015 at Unknown time  . nitroGLYCERIN (NITROSTAT) 0.4 MG SL tablet Place 0.4 mg under the tongue every 5 (five) minutes as needed for chest pain. May take up to 3 doses per episode. Contact MD or 911 if unresolved chest pain continues.   unknown  . oxyCODONE-acetaminophen (PERCOCET/ROXICET) 5-325 MG tablet Take 1 tablet by mouth 4 (four) times daily as needed. pain  0 10/22/2015 at 100am  . predniSONE (DELTASONE) 5 MG tablet Take 5 mg by mouth daily with breakfast.   10/21/2015 at Unknown time  . tamsulosin (FLOMAX) 0.4 MG CAPS capsule Take 1 capsule (0.4 mg total) by mouth daily after breakfast. 30 capsule 1 10/21/2015 at Unknown time   Scheduled: . aspirin EC  81 mg Oral Daily  . atorvastatin  40 mg Oral q1800  .  bisacodyl  10 mg Rectal Once  . carvedilol  6.25 mg Oral BID WC  . cefTRIAXone (ROCEPHIN)  IV  1 g Intravenous Q24H  . docusate sodium  100 mg Oral Daily  . doxazosin  8  mg Oral Daily  . enoxaparin (LOVENOX) injection  40 mg Subcutaneous Q24H  . finasteride  5 mg Oral Daily  . insulin aspart  0-15 Units Subcutaneous TID WC  . methylPREDNISolone (SOLU-MEDROL) injection  125 mg Intravenous Q6H  . multivitamin with minerals  1 tablet Oral Daily  . polyethylene glycol  17 g Oral Daily  . tamsulosin  0.4 mg Oral QPC breakfast   Continuous: . sodium chloride 75 mL/hr at 10/23/15 0903   OBO:[id number] **OR** acetaminophen, HYDROmorphone (DILAUDID) injection, methocarbamol (ROBAXIN)  IV, nitroGLYCERIN, ondansetron **OR** ondansetron (ZOFRAN) IV, oxyCODONE-acetaminophen  Assesment: On his physical examination above it should say that he has wasting of the lower extremities for his extremity exam  He has intractable back pain likely related to his previous multiple surgeries, cauda equina syndrome and a new ruptured disc with spinal stenosis. His neurosurgeon does not think he is a candidate for more surgery  He has multiple other medical problems. He's been on chronic prednisone because of a chronic rash.  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 I'm going to put him on IV steroids. He may need injections in his back depending on how he responds to all of this. He is going to need to be on sliding scale insulin.      Mickenzie Stolar  L 10/23/2015, 8:29 AM

## 2015-10-23 NOTE — Plan of Care (Signed)
Problem: Acute Rehab PT Goals(only PT should resolve) Goal: Pt will Roll Supine to Side Demonstrating proper log rolling technique to decrease stress to his spine.

## 2015-10-23 NOTE — Evaluation (Signed)
Physical Therapy Evaluation Patient Details Name: [name] MRN: [id number] DOB: 12-25-1940 Today's Date: 10/23/2015   History of Present Illness  [name] is a 75 y.o. male who presents to the hospital with complaints of back pain. Patient is a very functional 74 year old gentleman who works out at [employer] regularly and is independent/driving. Patient reports that yesterday while he was laying down he developed left lower back pain. It was shooting down his left leg. He denies any trauma to his back, didn't denies lifting any heavy objects. He did not exert himself out of the ordinary. He has chronic urinary retention and self caths. He also has issues with constipation and often requires suppositories and manual disimpactions. The patient does have chronic back issues and has undergone back surgery in the past at [facility]. Due to intractable pain, he's been referred for admission.  Clinical Impression  Pt found in sidelying position in his bed upon therapist arrival. He was A&Ox3 and reporting high levels of pain in his back and stating he isn't sure how he will be able to get up with his LLE feeling as weak as it does. Pt demonstrating independence with bed mobility, however he has poor tolerance to sitting EOB secondary to pain in his back. He is also able to transfer sit to stand with supervision using RW for support. Therapist noting pt to be somewhat impulsive with his activity in order to find comfort from his back pain. Pt was able to take 2 steps with CGA, noting increased difficulty advancing his LLE, however pt was unwilling to ambulate any further, requesting that he return back to bed to relieve his pain. Pt then was able to take 2 steps backwards with RW and CGA. He demonstrates 4/5 strength throughout his RLE, however he is significantly weak in his LLE. Therapist reviewed proper log rolling technique with the pt to decrease stress to his spine, but pt unwilling to  demonstrate stating he does what will get the job done. At this time, it is difficult to determine how much weakness the pt experienced prior to this hospitalization with inconsistent report from him concerning his PLOF. According to him, he uses the w/c for his primary mode of transportation at home, however he would use his rollator for the community. At this time, he would benefit from continued PT services here in the hospital as well as after d/c to address his impairments in strength, mobility and function. Will continue to assess pt progress while at [facility] and update POC as necessary.    Follow Up Recommendations Outpatient PT;Supervision for mobility/OOB (Pt )    Equipment Recommendations  None recommended by PT (Pt reporting he feels much more safe with his walker from home. )    Recommendations for Other Services       Precautions / Restrictions        Mobility  Bed Mobility Overal bed mobility: Independent             General bed mobility comments: increased pain noted  Transfers Overall transfer level: Needs assistance Equipment used: Rolling walker (2 wheeled) Transfers: Sit to/from Stand Sit to Stand: Supervision         General transfer comment: Pt impulsively standing from EOB reporting "I have to stand bc my back is hurting"   Ambulation/Gait Ambulation/Gait assistance: Min guard Ambulation Distance (Feet): 2 Feet Assistive device: Rolling walker (2 wheeled) Gait Pattern/deviations: Step-to pattern;Decreased stride length;Decreased step length - right;Decreased stance time -  left (L step to )   Gait velocity interpretation: <1.8 ft/sec, indicative of risk for recurrent falls General Gait Details: Pt frantic and with increased weight through BUE for support during standing/ambulation. Unwilling to take more than 2 steps with RW before requesting to return to bed  Stairs            Wheelchair Mobility    Modified Rankin (Stroke Patients Only)        Balance Overall balance assessment: Needs assistance Sitting-balance support: No upper extremity supported;Feet supported Sitting balance-Leahy Scale: Good     Standing balance support: Bilateral upper extremity supported Standing balance-Leahy Scale: Fair Standing balance comment: unable to accept full weight into LLE due to weakness.                              Pertinent Vitals/Pain Pain Assessment: 0-10 Pain Score: 7  Pain Location: Low back Pain Descriptors / Indicators: Aching Pain Intervention(s): Patient requesting pain meds-RN notified;Limited activity within patient's tolerance    Home Living Family/patient expects to be discharged to:: Private residence Living Arrangements: Spouse/significant other Available Help at Discharge: Family Type of Home: House Home Access: Ramped entrance     Home Layout: One level Home Equipment: Environmental consultantWalker - 2 wheels;Walker - 4 wheels;Wheelchair - manual;Shower seat;Grab bars - tub/shower;Grab bars - toilet      Prior Function Level of Independence: Independent with assistive device(s)         Comments: Pt reports he would use w/c at home and his rollator in the community because it was easier to put in his trunk      Hand Dominance        Extremity/Trunk Assessment   Upper Extremity Assessment: Overall WFL for tasks assessed           Lower Extremity Assessment: Generalized weakness (L hip ABD: 2-/5, L knee flex/ext: 3/5, L hip ext: 2+/5; RLE grossly 4/5 (assessed in bed))         Communication   Communication: No difficulties  Cognition Arousal/Alertness: Awake/alert Behavior During Therapy: Impulsive (secondary to pain and fear of falling) Overall Cognitive Status: No family/caregiver present to determine baseline cognitive functioning                      General Comments      Exercises General Exercises - Lower Extremity Heel Slides: AROM;Both;5 reps (in sidelying secondary to  pain) Hip ABduction/ADduction: AROM;Both;5 reps (clamshells in sidelying)      Assessment/Plan    PT Assessment Patient needs continued PT services  PT Diagnosis Difficulty walking;Abnormality of gait;Generalized weakness;Acute pain   PT Problem List Decreased strength;Decreased activity tolerance;Decreased balance;Decreased safety awareness;Pain  PT Treatment Interventions Gait training;Functional mobility training;Manual techniques;Balance training;Patient/family education;Therapeutic exercise;Therapeutic activities;Modalities;DME instruction   PT Goals (Current goals can be found in the Care Plan section) Acute Rehab PT Goals Patient Stated Goal: Decrease pain and improve strength PT Goal Formulation: With patient Time For Goal Achievement: 11/06/15 Potential to Achieve Goals: Fair    Frequency Min 3X/week   Barriers to discharge        Co-evaluation               End of Session Equipment Utilized During Treatment: Gait belt Activity Tolerance: Patient limited by pain Patient left: in bed;with call bell/phone within reach Nurse Communication: Mobility status (Spoke with nurse tech; informed her that pt requires supervision for OOB activity at this  time for safety)         Time: 4401-0272 PT Time Calculation (min) (ACUTE ONLY): 25 min   Charges:   PT Evaluation $PT Eval Moderate Complexity: 1 Procedure PT Treatments $Therapeutic Exercise: 8-22 mins $Therapeutic Activity: 8-22 mins   PT G Codes:       2:58 PM,Nov 15, 2015 Marylyn Ishihara PT, DPT [facility] Outpatient Physical Therapy [phone]

## 2015-10-23 NOTE — Progress Notes (Signed)
Nutrition Brief Note  Patient identified on the Malnutrition Screening Tool (MST) Report  Wt Readings from Last 15 Encounters:  10/22/15 180 lb (81.647 kg)  11/17/14 170 lb (77.111 kg)  10/11/14 175 lb (79.379 kg)  04/26/14 181 lb (82.101 kg)  04/17/14 181 lb (82.101 kg)  10/11/13 180 lb (81.647 kg)  09/05/13 198 lb 6.6 oz (90 kg)  09/02/13 190 lb (86.183 kg)  08/30/13 190 lb (86.183 kg)  08/08/13 179 lb 14.4 oz (81.602 kg)  07/30/13 179 lb 14.4 oz (81.602 kg)  07/06/13 185 lb (83.915 kg)  06/17/13 190 lb (86.183 kg)  05/05/13 187 lb (84.823 kg)  04/27/13 190 lb (86.183 kg)    Body mass index is 24.41 kg/(m^2). Patient meets criteria for Healthy Ht to wt based on current BMI.   Patient says the wt loss that he reported on MST screen was from 2 years ago when he had multiple back surgeries. Recently his weight has been stable and he has been eating well. He is very active and goes to the gym 3x per week. He took a multivitamin.   No malnutrition detected.   Pt is currently is very severe pain and reports not having much of an appetite. Requested soup.   Current diet order is Carb modified and patient is consuming approximately 75% of meals at this time.   Will pass meal requests to dietary. If nutrition issues arise, please consult RD.   Christophe LouisNathan Franks RD, LDN Clinical Nutrition Pager: [phone] 10/23/2015 12:22 PM

## 2015-10-24 LAB — GLUCOSE, CAPILLARY
GLUCOSE-CAPILLARY: 155 mg/dL — AB (ref 65–99)
GLUCOSE-CAPILLARY: 184 mg/dL — AB (ref 65–99)
Glucose-Capillary: 160 mg/dL — ABNORMAL HIGH (ref 65–99)

## 2015-10-24 LAB — HEMOGLOBIN A1C
Hgb A1c MFr Bld: 6 % — ABNORMAL HIGH (ref 4.8–5.6)
Mean Plasma Glucose: 126 mg/dL

## 2015-10-24 NOTE — Progress Notes (Signed)
Subjective: He was admitted with chronic pain which is intractable. This is acute on chronic. He was doing much better with high-dose steroids then he developed much worse problems after he had been sitting on a bedside commode. He has difficulty with the strength in his left leg now.  Objective: Vital signs in last 24 hours: Temp:  [97.2 F (36.2 C)-98.3 F (36.8 C)] 98.3 F (36.8 C) (03/29 0828) Pulse Rate:  [68-78] 72 (03/29 0828) Resp:  [20] 20 (03/29 0828) BP: (152-180)/(47-70) 173/51 mmHg (03/29 0828) SpO2:  [95 %-98 %] 97 % (03/29 0828) Weight change:  Last BM Date: 10/20/15  Intake/Output from previous day: 03/28 0701 - 03/29 0700 In: 1815 [P.O.:720; I.V.:1045; IV Piggyback:50] Out: 650 [Urine:650]  PHYSICAL EXAM General appearance: alert, cooperative and severe distress Resp: clear to auscultation bilaterally Cardio: regular rate and rhythm, S1, S2 normal, no murmur, click, rub or gallop GI: soft, non-tender; bowel sounds normal; no masses,  no organomegaly Extremities: Wasting of the extremities  Lab Results:  Results for orders placed or performed during the hospital encounter of 10/22/15 (from the past 48 hour(s))  CBC     Status: Abnormal   Collection Time: 10/23/15  6:41 AM  Result Value Ref Range   WBC 10.8 (H) 4.0 - 10.5 K/uL   RBC 4.20 (L) 4.22 - 5.81 MIL/uL   Hemoglobin 12.9 (L) 13.0 - 17.0 g/dL   HCT 39.0 39.0 - 52.0 %   MCV 92.9 78.0 - 100.0 fL   MCH 30.7 26.0 - 34.0 pg   MCHC 33.1 30.0 - 36.0 g/dL   RDW 14.2 11.5 - 15.5 %   Platelets 212 150 - 400 K/uL  Basic metabolic panel     Status: Abnormal   Collection Time: 10/23/15  6:41 AM  Result Value Ref Range   Sodium 140 135 - 145 mmol/L   Potassium 4.3 3.5 - 5.1 mmol/L   Chloride 102 101 - 111 mmol/L   CO2 28 22 - 32 mmol/L   Glucose, Bld 106 (H) 65 - 99 mg/dL   BUN 36 (H) 6 - 20 mg/dL   Creatinine, Ser 1.18 0.61 - 1.24 mg/dL   Calcium 9.1 8.9 - 10.3 mg/dL   GFR calc non Af Amer 60 (L) >60  mL/min   GFR calc Af Amer >60 >60 mL/min    Comment: (NOTE) The eGFR has been calculated using the CKD EPI equation. This calculation has not been validated in all clinical situations. eGFR's persistently <60 mL/min signify possible Chronic Kidney Disease.    Anion gap 10 5 - 15  Hemoglobin A1c     Status: Abnormal   Collection Time: 10/23/15  6:41 AM  Result Value Ref Range   Hgb A1c MFr Bld 6.0 (H) 4.8 - 5.6 %    Comment: (NOTE)         Pre-diabetes: 5.7 - 6.4         Diabetes: >6.4         Glycemic control for adults with diabetes: <7.0    Mean Plasma Glucose 126 mg/dL    Comment: (NOTE) Performed At: [facility] [address] [place], [place] [id number] Lindon Romp MD GG:[id number]   Glucose, capillary     Status: Abnormal   Collection Time: 10/23/15  2:12 PM  Result Value Ref Range   Glucose-Capillary 119 (H) 65 - 99 mg/dL  Glucose, capillary     Status: Abnormal   Collection Time: 10/23/15  5:20 PM  Result Value  Ref Range   Glucose-Capillary 186 (H) 65 - 99 mg/dL   Comment 1 Notify RN    Comment 2 Document in Chart   Glucose, capillary     Status: Abnormal   Collection Time: 10/23/15  8:46 PM  Result Value Ref Range   Glucose-Capillary 129 (H) 65 - 99 mg/dL   Comment 1 Notify RN    Comment 2 Document in Chart   Glucose, capillary     Status: Abnormal   Collection Time: 10/24/15  7:06 AM  Result Value Ref Range   Glucose-Capillary 155 (H) 65 - 99 mg/dL   Comment 1 Notify RN    Comment 2 Document in Chart     ABGS No results for input(s): PHART, PO2ART, TCO2, HCO3 in the last 72 hours.  Invalid input(s): PCO2 CULTURES Recent Results (from the past 240 hour(s))  Urine culture     Status: None (Preliminary result)   Collection Time: 10/22/15  4:47 AM  Result Value Ref Range Status   Specimen Description URINE, CLEAN CATCH  Final   Special Requests NONE  Final   Culture   Final    >=100,000 COLONIES/mL GRAM NEGATIVE RODS CULTURE REINCUBATED  FOR BETTER GROWTH Performed at [facility]    Report Status PENDING  Incomplete   Studies/Results: No results found.  Medications:  Prior to Admission:  Prescriptions prior to admission  Medication Sig Dispense Refill Last Dose  . acetaminophen (TYLENOL) 325 MG tablet Take 1-2 tablets (325-650 mg total) by mouth every 4 (four) hours as needed for mild pain.   unknown  . aspirin EC 81 MG tablet Take 81 mg by mouth daily.   10/21/2015 at Unknown time  . atorvastatin (LIPITOR) 40 MG tablet TAKE 1 TABLET BY MOUTH EVERY DAY 30 tablet 6 10/21/2015 at Unknown time  . carvedilol (COREG) 6.25 MG tablet Take 1 tablet (6.25 mg total) by mouth 2 (two) times daily. 180 tablet 3 10/21/2015 at 800  . docusate sodium (COLACE) 100 MG capsule Take 100 mg by mouth daily.   10/21/2015 at Unknown time  . doxazosin (CARDURA) 8 MG tablet Take 8 mg by mouth daily.   10/21/2015 at Unknown time  . finasteride (PROSCAR) 5 MG tablet Take 5 mg by mouth daily.   10/21/2015 at Unknown time  . Multiple Vitamin (MULTIVITAMIN) tablet Take 1 tablet by mouth daily.   10/21/2015 at Unknown time  . nitroGLYCERIN (NITROSTAT) 0.4 MG SL tablet Place 0.4 mg under the tongue every 5 (five) minutes as needed for chest pain. May take up to 3 doses per episode. Contact MD or 911 if unresolved chest pain continues.   unknown  . oxyCODONE-acetaminophen (PERCOCET/ROXICET) 5-325 MG tablet Take 1 tablet by mouth 4 (four) times daily as needed. pain  0 10/22/2015 at 100am  . predniSONE (DELTASONE) 5 MG tablet Take 5 mg by mouth daily with breakfast.   10/21/2015 at Unknown time  . tamsulosin (FLOMAX) 0.4 MG CAPS capsule Take 1 capsule (0.4 mg total) by mouth daily after breakfast. 30 capsule 1 10/21/2015 at Unknown time   Scheduled: . aspirin EC  81 mg Oral Daily  . atorvastatin  40 mg Oral q1800  . bisacodyl  10 mg Rectal Once  . carvedilol  6.25 mg Oral BID WC  . cefTRIAXone (ROCEPHIN)  IV  1 g Intravenous Q24H  . docusate sodium  100  mg Oral Daily  . doxazosin  8 mg Oral Daily  . enoxaparin (LOVENOX) injection  40 mg  Subcutaneous Q24H  . finasteride  5 mg Oral Daily  . insulin aspart  0-15 Units Subcutaneous TID WC  . methylPREDNISolone (SOLU-MEDROL) injection  125 mg Intravenous Q6H  . multivitamin with minerals  1 tablet Oral Daily  . polyethylene glycol  17 g Oral Daily  . tamsulosin  0.4 mg Oral QPC breakfast   Continuous: . sodium chloride 75 mL/hr at 10/24/15 0911   THY:[id number] **OR** acetaminophen, HYDROmorphone (DILAUDID) injection, methocarbamol (ROBAXIN)  IV, nitroGLYCERIN, ondansetron **OR** ondansetron (ZOFRAN) IV, oxyCODONE-acetaminophen  Assesment: He has intractable back pain. He has what looks like a new ruptured disc on his MRI. I'm concerned that he may have done more damage yesterday. He has multiple other medical problems including hypertension which is stable elevated blood sugar cardiac disease and history of mediastinitis Active Problems:   HLD (hyperlipidemia)   Essential hypertension   GASTROESOPHAGEAL REFLUX DISEASE   Intractable back pain   Back pain    Plan: I'm going to discuss with interventional radiology to see if they think they have anything that they can do to help him. If not I will discuss with his neurosurgeon    LOS: 1 day   [name] L 10/24/2015, 9:14 AM

## 2015-10-25 LAB — GLUCOSE, CAPILLARY
GLUCOSE-CAPILLARY: 138 mg/dL — AB (ref 65–99)
GLUCOSE-CAPILLARY: 132 mg/dL — AB (ref 65–99)
Glucose-Capillary: 204 mg/dL — ABNORMAL HIGH (ref 65–99)
Glucose-Capillary: 67 mg/dL (ref 65–99)
Glucose-Capillary: 96 mg/dL (ref 65–99)
Glucose-Capillary: 130 mg/dL — ABNORMAL HIGH (ref 65–99)

## 2015-10-25 LAB — URINE CULTURE: Culture: >=100000

## 2015-10-25 MED ORDER — METHOCARBAMOL 1000 MG/10ML IJ SOLN
INTRAMUSCULAR | Status: AC
  Filled 2015-10-25: qty 10

## 2015-10-25 NOTE — Consult Note (Signed)
   [facility]   10/25/2015  [name] 10/30/1940 [id number]   Patient screened for potential [facility] Care Management services. Patient is eligible for [facility] Care Management Services. Epic reviewed there were no identifiable [facility] needs at this time. [facility] services not appropriate at this time. If patient's post hospital needs change please place a [facility] consult. For questions please contact:    Alben SpittleMary E. Albertha GheeNiemczura, RN, BSN, [facility]   Hospital Liaison [facility] [phone]) Business Cell  [phone]) Toll Free Office

## 2015-10-25 NOTE — Plan of Care (Signed)
Problem: Acute Rehab PT Goals(only PT should resolve) Goal: Pt Will Ambulate Outcome: Not Met (add Reason) Limited by pain

## 2015-10-25 NOTE — Progress Notes (Signed)
Subjective: He feels a little better today. He has less pain and his leg is not as weak and numb.  Objective: Vital signs in last 24 hours: Temp:  [97.9 F (36.6 C)-98.2 F (36.8 C)] 98.2 F (36.8 C) (03/30 0500) Pulse Rate:  [67-85] 71 (03/30 0500) Resp:  [18-20] 18 (03/30 0500) BP: (151-170)/(64-66) 168/64 mmHg (03/30 0500) SpO2:  [96 %-97 %] 97 % (03/30 0500) Weight change:  Last BM Date: 10/20/15  Intake/Output from previous day: 03/29 0701 - 03/30 0700 In: 720 [P.O.:720] Out: 700 [Urine:700]  PHYSICAL EXAM General appearance: alert, cooperative and mild distress Resp: clear to auscultation bilaterally Cardio: regular rate and rhythm, S1, S2 normal, no murmur, click, rub or gallop GI: soft, non-tender; bowel sounds normal; no masses,  no organomegaly Extremities: He has wasting of the lower extremities but he can move his legs better  Lab Results:  Results for orders placed or performed during the hospital encounter of 10/22/15 (from the past 48 hour(s))  Glucose, capillary     Status: Abnormal   Collection Time: 10/23/15  2:12 PM  Result Value Ref Range   Glucose-Capillary 119 (H) 65 - 99 mg/dL  Glucose, capillary     Status: Abnormal   Collection Time: 10/23/15  5:20 PM  Result Value Ref Range   Glucose-Capillary 186 (H) 65 - 99 mg/dL   Comment 1 Notify RN    Comment 2 Document in Chart   Glucose, capillary     Status: Abnormal   Collection Time: 10/23/15  8:46 PM  Result Value Ref Range   Glucose-Capillary 129 (H) 65 - 99 mg/dL   Comment 1 Notify RN    Comment 2 Document in Chart   Glucose, capillary     Status: Abnormal   Collection Time: 10/24/15  7:06 AM  Result Value Ref Range   Glucose-Capillary 155 (H) 65 - 99 mg/dL   Comment 1 Notify RN    Comment 2 Document in Chart   Glucose, capillary     Status: Abnormal   Collection Time: 10/24/15 11:26 AM  Result Value Ref Range   Glucose-Capillary 184 (H) 65 - 99 mg/dL   Comment 1 Notify RN    Comment 2  Document in Chart   Glucose, capillary     Status: Abnormal   Collection Time: 10/24/15  4:10 PM  Result Value Ref Range   Glucose-Capillary 160 (H) 65 - 99 mg/dL   Comment 1 Notify RN    Comment 2 Document in Chart   Glucose, capillary     Status: Abnormal   Collection Time: 10/24/15  8:17 PM  Result Value Ref Range   Glucose-Capillary 138 (H) 65 - 99 mg/dL   Comment 1 Notify RN    Comment 2 Document in Chart   Glucose, capillary     Status: Abnormal   Collection Time: 10/25/15  7:56 AM  Result Value Ref Range   Glucose-Capillary 132 (H) 65 - 99 mg/dL   Comment 1 Notify RN     ABGS No results for input(s): PHART, PO2ART, TCO2, HCO3 in the last 72 hours.  Invalid input(s): PCO2 CULTURES Recent Results (from the past 240 hour(s))  Urine culture     Status: None (Preliminary result)   Collection Time: 10/22/15  4:47 AM  Result Value Ref Range Status   Specimen Description URINE, CLEAN CATCH  Final   Special Requests NONE  Final   Culture   Final    >=100,000 COLONIES/mL GRAM NEGATIVE RODS IDENTIFICATION  AND SUSCEPTIBILITIES TO FOLLOW Performed at [facility]    Report Status PENDING  Incomplete   Studies/Results: No results found.  Medications:  Prior to Admission:  Prescriptions prior to admission  Medication Sig Dispense Refill Last Dose  . acetaminophen (TYLENOL) 325 MG tablet Take 1-2 tablets (325-650 mg total) by mouth every 4 (four) hours as needed for mild pain.   unknown  . aspirin EC 81 MG tablet Take 81 mg by mouth daily.   10/21/2015 at Unknown time  . atorvastatin (LIPITOR) 40 MG tablet TAKE 1 TABLET BY MOUTH EVERY DAY 30 tablet 6 10/21/2015 at Unknown time  . carvedilol (COREG) 6.25 MG tablet Take 1 tablet (6.25 mg total) by mouth 2 (two) times daily. 180 tablet 3 10/21/2015 at 800  . docusate sodium (COLACE) 100 MG capsule Take 100 mg by mouth daily.   10/21/2015 at Unknown time  . doxazosin (CARDURA) 8 MG tablet Take 8 mg by mouth daily.   10/21/2015  at Unknown time  . finasteride (PROSCAR) 5 MG tablet Take 5 mg by mouth daily.   10/21/2015 at Unknown time  . Multiple Vitamin (MULTIVITAMIN) tablet Take 1 tablet by mouth daily.   10/21/2015 at Unknown time  . nitroGLYCERIN (NITROSTAT) 0.4 MG SL tablet Place 0.4 mg under the tongue every 5 (five) minutes as needed for chest pain. May take up to 3 doses per episode. Contact MD or 911 if unresolved chest pain continues.   unknown  . oxyCODONE-acetaminophen (PERCOCET/ROXICET) 5-325 MG tablet Take 1 tablet by mouth 4 (four) times daily as needed. pain  0 10/22/2015 at 100am  . predniSONE (DELTASONE) 5 MG tablet Take 5 mg by mouth daily with breakfast.   10/21/2015 at Unknown time  . tamsulosin (FLOMAX) 0.4 MG CAPS capsule Take 1 capsule (0.4 mg total) by mouth daily after breakfast. 30 capsule 1 10/21/2015 at Unknown time   Scheduled: . aspirin EC  81 mg Oral Daily  . atorvastatin  40 mg Oral q1800  . bisacodyl  10 mg Rectal Once  . carvedilol  6.25 mg Oral BID WC  . cefTRIAXone (ROCEPHIN)  IV  1 g Intravenous Q24H  . docusate sodium  100 mg Oral Daily  . doxazosin  8 mg Oral Daily  . enoxaparin (LOVENOX) injection  40 mg Subcutaneous Q24H  . finasteride  5 mg Oral Daily  . insulin aspart  0-15 Units Subcutaneous TID WC  . methylPREDNISolone (SOLU-MEDROL) injection  125 mg Intravenous Q6H  . multivitamin with minerals  1 tablet Oral Daily  . polyethylene glycol  17 g Oral Daily  . tamsulosin  0.4 mg Oral QPC breakfast   Continuous: . sodium chloride 75 mL/hr at 10/25/15 0029   ZOX:[id number] **OR** acetaminophen, HYDROmorphone (DILAUDID) injection, methocarbamol (ROBAXIN)  IV, nitroGLYCERIN, ondansetron **OR** ondansetron (ZOFRAN) IV, oxyCODONE-acetaminophen  Assesment: He was admitted with intractable back pain and he has another ruptured disc. He had a severe exacerbation yesterday with more problem with numbness. He seems to be improving with high-dose steroids. I discussed the situation  with his neurosurgeon yesterday and she is requesting the films to be sent to her so she can review them.  He has urinary tract infection which seems to be improving on Rocephin. He chronically self catheterizes and that's probably the source of the urinary tract infection Active Problems:   HLD (hyperlipidemia)   Essential hypertension   GASTROESOPHAGEAL REFLUX DISEASE   Intractable back pain   Back pain    Plan: Continue current  treatments. No change today.    LOS: 2 days   Makell Drohan L 10/25/2015, 9:02 AM

## 2015-10-25 NOTE — Progress Notes (Addendum)
Physical Therapy Treatment Patient Details Name: [name] MRN: [id number] DOB: 04/05/1941 Today's Date: 10/25/2015    History of Present Illness [name] is a 75 y.o. male who presents to the hospital with complaints of back pain. Patient is a very functional 75 year old gentleman who works out at [employer] regularly and is independent/driving. Patient reports that yesterday while he was laying down he developed left lower back pain. It was shooting down his left leg. He denies any trauma to his back, didn't denies lifting any heavy objects. He did not exert himself out of the ordinary. He has chronic urinary retention and self caths. He also has issues with constipation and often requires suppositories and manual disimpactions. The patient does have chronic back issues and [facility] Due to intractable pain, he's been referred for admission.    PT Comments    Upon entrance pt sidelying in bed, pt alert and willing to participate with therapist.  Pt limited by back pain especially in seated position, pain scale 7/10 lower back.  Pt premedicated.  Session focus on improving LE and functional strengthening.  Bed exercises complete with minimal difficutly and AROM.  Sit to stand with min assistance and verbal cueing for handplacement prior standing.  Pt's pain limited ability to complete gait training, noted decreased weight bearing Lt LE.  Weight shifting exercises complete and minisquats for gluteal strengthening.  End of session pt left in bed with call bell within reach.  No reports of increased pain through session.    10/25/15 1751  Pain Assessment  Pain Assessment 0-10  Pain Score 7  Pain Type Chronic pain  Pain Location Back  Pain Descriptors / Indicators Aching  Pain Frequency Constant  Pain Onset On-going  Patients Stated Pain Goal 2  Pain Intervention(s) Medication (See eMAR);RN made aware     Follow Up  Recommendations        Equipment Recommendations       Recommendations for Other Services       Precautions / Restrictions      Mobility  Bed Mobility Overal bed mobility: Independent             General bed mobility comments: increased pain noted  Transfers Overall transfer level: Modified independent Equipment used: Rolling walker (2 wheeled) Transfers: Sit to/from Stand Sit to Stand: Supervision         General transfer comment: Safe mechanics noted, minimal cueing for hand placement.  Decreased weight bearing Lt LE due to pain and weakness  Ambulation/Gait                 Stairs            Wheelchair Mobility    Modified Rankin (Stroke Patients Only)       Balance                                    Cognition Arousal/Alertness: Awake/alert Behavior During Therapy: WFL for tasks assessed/performed Overall Cognitive Status: Within Functional Limits for tasks assessed                      Exercises General Exercises - Lower Extremity Ankle Circles/Pumps: AROM;Both;10 reps;Supine Quad Sets: AROM;Both;10 reps;Supine Short Arc Quad: AROM;Both;10 reps;Supine Heel Slides: AROM;Both;10 reps;Supine Hip ABduction/ADduction: AROM;Both;10 reps;Supine Straight Leg Raises: AROM;Both;10 reps;Supine Mini-Sqauts: AROM;Both (8 reps)  General Comments        Pertinent Vitals/Pain Pain Assessment: No/denies pain Pain Score: 7  Pain Location: Lower back Pain Descriptors / Indicators: Aching Pain Intervention(s): Premedicated before session;Repositioned;Limited activity within patient's tolerance    Home Living                      Prior Function            PT Goals (current goals can now be found in the care plan section) Acute Rehab PT Goals Patient Stated Goal: Decrease pain and improve strength Progress towards PT goals: Progressing toward goals    Frequency       PT Plan Current plan remains  appropriate    Co-evaluation             End of Session Equipment Utilized During Treatment: Gait belt Activity Tolerance: Patient limited by pain Patient left: in bed;with call bell/phone within reach     Time: 1015-1037 PT Time Calculation (min) (ACUTE ONLY): 22 min  Charges:  $Therapeutic Exercise: 8-22 mins $Therapeutic Activity: 8-22 mins                    G Codes:     Becky Sax, LPTA; CBIS [phone]  Juel Burrow 10/25/2015, 6:33 PM

## 2015-10-26 DIAGNOSIS — N390 Urinary tract infection, site not specified: Secondary | ICD-10-CM | POA: Diagnosis present

## 2015-10-26 DIAGNOSIS — S343XXA Injury of cauda equina, initial encounter: Secondary | ICD-10-CM | POA: Diagnosis present

## 2015-10-26 LAB — GLUCOSE, CAPILLARY
GLUCOSE-CAPILLARY: 88 mg/dL (ref 65–99)
GLUCOSE-CAPILLARY: 96 mg/dL (ref 65–99)

## 2015-10-26 MED ORDER — METHYLPREDNISOLONE SODIUM SUCC 125 MG IJ SOLR
60.0000 mg | Freq: Four times a day (QID) | INTRAMUSCULAR | Status: DC
  Administered 2015-10-26 – 2015-10-28 (×7): 60 mg via INTRAVENOUS
  Filled 2015-10-26 (×7): qty 2

## 2015-10-26 NOTE — Progress Notes (Signed)
Subjective: He is slowly improving. He has more sensation in his leg. His pain is better. He is growing Klebsiella in his urine which is sensitive to Rocephin  Objective: Vital signs in last 24 hours: Temp:  [97.7 F (36.5 C)-98 F (36.7 C)] 97.7 F (36.5 C) (03/31 0437) Pulse Rate:  [60-68] 68 (03/31 0437) Resp:  [18-20] 18 (03/31 0437) BP: (169-183)/(58-73) 169/73 mmHg (03/31 0437) SpO2:  [96 %-98 %] 98 % (03/31 0437) Weight change:  Last BM Date: 10/20/15  Intake/Output from previous day: 03/30 0701 - 03/31 0700 In: 940 [P.O.:240; I.V.:600; IV Piggyback:100] Out: 1350 [Urine:1350]  PHYSICAL EXAM General appearance: alert, cooperative and mild distress Resp: clear to auscultation bilaterally Cardio: regular rate and rhythm, S1, S2 normal, no murmur, click, rub or gallop GI: soft, non-tender; bowel sounds normal; no masses,  no organomegaly Extremities: wasting of legs  Lab Results:  Results for orders placed or performed during the hospital encounter of 10/22/15 (from the past 48 hour(s))  Glucose, capillary     Status: Abnormal   Collection Time: 10/24/15 11:26 AM  Result Value Ref Range   Glucose-Capillary 184 (H) 65 - 99 mg/dL   Comment 1 Notify RN    Comment 2 Document in Chart   Glucose, capillary     Status: Abnormal   Collection Time: 10/24/15  4:10 PM  Result Value Ref Range   Glucose-Capillary 160 (H) 65 - 99 mg/dL   Comment 1 Notify RN    Comment 2 Document in Chart   Glucose, capillary     Status: Abnormal   Collection Time: 10/24/15  8:17 PM  Result Value Ref Range   Glucose-Capillary 138 (H) 65 - 99 mg/dL   Comment 1 Notify RN    Comment 2 Document in Chart   Glucose, capillary     Status: Abnormal   Collection Time: 10/25/15  7:56 AM  Result Value Ref Range   Glucose-Capillary 132 (H) 65 - 99 mg/dL   Comment 1 Notify RN   Glucose, capillary     Status: Abnormal   Collection Time: 10/25/15 11:09 AM  Result Value Ref Range   Glucose-Capillary 204  (H) 65 - 99 mg/dL   Comment 1 Notify RN   Glucose, capillary     Status: None   Collection Time: 10/25/15  4:50 PM  Result Value Ref Range   Glucose-Capillary 67 65 - 99 mg/dL  Glucose, capillary     Status: None   Collection Time: 10/25/15  5:39 PM  Result Value Ref Range   Glucose-Capillary 96 65 - 99 mg/dL  Glucose, capillary     Status: Abnormal   Collection Time: 10/25/15  7:56 PM  Result Value Ref Range   Glucose-Capillary 130 (H) 65 - 99 mg/dL    ABGS No results for input(s): PHART, PO2ART, TCO2, HCO3 in the last 72 hours.  Invalid input(s): PCO2 CULTURES Recent Results (from the past 240 hour(s))  Urine culture     Status: None   Collection Time: 10/22/15  4:47 AM  Result Value Ref Range Status   Specimen Description URINE, CLEAN CATCH  Final   Special Requests NONE  Final   Culture   Final    >=100,000 COLONIES/mL KLEBSIELLA PNEUMONIAE Performed at [facility]    Report Status 10/25/2015 FINAL  Final   Organism ID, Bacteria KLEBSIELLA PNEUMONIAE  Final      Susceptibility   Klebsiella pneumoniae - MIC*    AMPICILLIN >=32 RESISTANT Resistant  CEFAZOLIN <=4 SENSITIVE Sensitive     CEFTRIAXONE <=1 SENSITIVE Sensitive     CIPROFLOXACIN <=0.25 SENSITIVE Sensitive     GENTAMICIN <=1 SENSITIVE Sensitive     IMIPENEM <=0.25 SENSITIVE Sensitive     NITROFURANTOIN 64 INTERMEDIATE Intermediate     TRIMETH/SULFA <=20 SENSITIVE Sensitive     AMPICILLIN/SULBACTAM 4 SENSITIVE Sensitive     PIP/TAZO <=4 SENSITIVE Sensitive     * >=100,000 COLONIES/mL KLEBSIELLA PNEUMONIAE   Studies/Results: No results found.  Medications:  Prior to Admission:  Prescriptions prior to admission  Medication Sig Dispense Refill Last Dose  . acetaminophen (TYLENOL) 325 MG tablet Take 1-2 tablets (325-650 mg total) by mouth every 4 (four) hours as needed for mild pain.   unknown  . aspirin EC 81 MG tablet Take 81 mg by mouth daily.   10/21/2015 at Unknown time  . atorvastatin  (LIPITOR) 40 MG tablet TAKE 1 TABLET BY MOUTH EVERY DAY 30 tablet 6 10/21/2015 at Unknown time  . carvedilol (COREG) 6.25 MG tablet Take 1 tablet (6.25 mg total) by mouth 2 (two) times daily. 180 tablet 3 10/21/2015 at 800  . docusate sodium (COLACE) 100 MG capsule Take 100 mg by mouth daily.   10/21/2015 at Unknown time  . doxazosin (CARDURA) 8 MG tablet Take 8 mg by mouth daily.   10/21/2015 at Unknown time  . finasteride (PROSCAR) 5 MG tablet Take 5 mg by mouth daily.   10/21/2015 at Unknown time  . Multiple Vitamin (MULTIVITAMIN) tablet Take 1 tablet by mouth daily.   10/21/2015 at Unknown time  . nitroGLYCERIN (NITROSTAT) 0.4 MG SL tablet Place 0.4 mg under the tongue every 5 (five) minutes as needed for chest pain. May take up to 3 doses per episode. Contact MD or 911 if unresolved chest pain continues.   unknown  . oxyCODONE-acetaminophen (PERCOCET/ROXICET) 5-325 MG tablet Take 1 tablet by mouth 4 (four) times daily as needed. pain  0 10/22/2015 at 100am  . predniSONE (DELTASONE) 5 MG tablet Take 5 mg by mouth daily with breakfast.   10/21/2015 at Unknown time  . tamsulosin (FLOMAX) 0.4 MG CAPS capsule Take 1 capsule (0.4 mg total) by mouth daily after breakfast. 30 capsule 1 10/21/2015 at Unknown time   Scheduled: . aspirin EC  81 mg Oral Daily  . atorvastatin  40 mg Oral q1800  . bisacodyl  10 mg Rectal Once  . carvedilol  6.25 mg Oral BID WC  . cefTRIAXone (ROCEPHIN)  IV  1 g Intravenous Q24H  . docusate sodium  100 mg Oral Daily  . doxazosin  8 mg Oral Daily  . enoxaparin (LOVENOX) injection  40 mg Subcutaneous Q24H  . finasteride  5 mg Oral Daily  . insulin aspart  0-15 Units Subcutaneous TID WC  . methylPREDNISolone (SOLU-MEDROL) injection  60 mg Intravenous Q6H  . multivitamin with minerals  1 tablet Oral Daily  . polyethylene glycol  17 g Oral Daily  . tamsulosin  0.4 mg Oral QPC breakfast   Continuous: . sodium chloride 75 mL/hr at 10/26/15 [id number]   WGN:[id number]:acetaminophen **OR**  acetaminophen, HYDROmorphone (DILAUDID) injection, methocarbamol (ROBAXIN)  IV, nitroGLYCERIN, ondansetron **OR** ondansetron (ZOFRAN) IV, oxyCODONE-acetaminophen  Assesment: He has intractable back pain. He has severe disc disease. He had cauda equina syndrome at baseline. He is improving. Active Problems:   HLD (hyperlipidemia)   Essential hypertension   GASTROESOPHAGEAL REFLUX DISEASE   Intractable back pain   Back pain    Plan: Continue steroids but in lower  dose. Continue other treatments. Continue PT. Continue Rocephin for his UTI    LOS: 3 days   Dorothea Yow L 10/26/2015, 8:45 AM

## 2015-10-26 NOTE — Care Management Important Message (Signed)
Important Message  Patient Details  Name: [name] MRN: [id number] Date of Birth: 05/04/1941   Medicare Important Message Given:  Yes    Adonis HugueninBerkhead, Ridhi Hoffert L, RN 10/26/2015, 8:47 AM

## 2015-10-27 LAB — GLUCOSE, CAPILLARY
GLUCOSE-CAPILLARY: 145 mg/dL — AB (ref 65–99)
GLUCOSE-CAPILLARY: 121 mg/dL — AB (ref 65–99)
Glucose-Capillary: 150 mg/dL — ABNORMAL HIGH (ref 65–99)
Glucose-Capillary: 199 mg/dL — ABNORMAL HIGH (ref 65–99)

## 2015-10-27 MED ORDER — CARVEDILOL 12.5 MG PO TABS
12.5000 mg | ORAL_TABLET | Freq: Two times a day (BID) | ORAL | Status: DC
Start: 2015-10-27 — End: 2015-10-29
  Administered 2015-10-27 – 2015-10-29 (×5): 12.5 mg via ORAL
  Filled 2015-10-27 (×5): qty 1

## 2015-10-27 NOTE — Progress Notes (Signed)
Subjective: He is still having significant pain and weakness. He wants to try to see if he can stand and perhaps walk a little bit today.  Objective: Vital signs in last 24 hours: Temp:  [97.9 F (36.6 C)] 97.9 F (36.6 C) (03/31 1754) Pulse Rate:  [74-75] 75 (04/01 0840) BP: (180-194)/(70-107) 194/107 mmHg (04/01 0840) SpO2:  [99 %] 99 % (03/31 1754) Weight change:  Last BM Date: 10/20/15  Intake/Output from previous day: 03/31 0701 - 04/01 0700 In: 3693.8 [P.O.:720; I.V.:2923.8; IV Piggyback:50] Out: 2100 [Urine:2100]  PHYSICAL EXAM General appearance: alert, cooperative and no distress Resp: clear to auscultation bilaterally Cardio: regular rate and rhythm, S1, S2 normal, no murmur, click, rub or gallop GI: soft, non-tender; bowel sounds normal; no masses,  no organomegaly Extremities: He has wasting of the lower extremities  Lab Results:  Results for orders placed or performed during the hospital encounter of 10/22/15 (from the past 48 hour(s))  Glucose, capillary     Status: Abnormal   Collection Time: 10/25/15 11:09 AM  Result Value Ref Range   Glucose-Capillary 204 (H) 65 - 99 mg/dL   Comment 1 Notify RN   Glucose, capillary     Status: None   Collection Time: 10/25/15  4:50 PM  Result Value Ref Range   Glucose-Capillary 67 65 - 99 mg/dL  Glucose, capillary     Status: None   Collection Time: 10/25/15  5:39 PM  Result Value Ref Range   Glucose-Capillary 96 65 - 99 mg/dL  Glucose, capillary     Status: Abnormal   Collection Time: 10/25/15  7:56 PM  Result Value Ref Range   Glucose-Capillary 130 (H) 65 - 99 mg/dL  Glucose, capillary     Status: None   Collection Time: 10/26/15  1:02 PM  Result Value Ref Range   Glucose-Capillary 88 65 - 99 mg/dL  Glucose, capillary     Status: None   Collection Time: 10/26/15  4:17 PM  Result Value Ref Range   Glucose-Capillary 96 65 - 99 mg/dL  Glucose, capillary     Status: Abnormal   Collection Time: 10/27/15  7:35 AM   Result Value Ref Range   Glucose-Capillary 150 (H) 65 - 99 mg/dL    ABGS No results for input(s): PHART, PO2ART, TCO2, HCO3 in the last 72 hours.  Invalid input(s): PCO2 CULTURES Recent Results (from the past 240 hour(s))  Urine culture     Status: None   Collection Time: 10/22/15  4:47 AM  Result Value Ref Range Status   Specimen Description URINE, CLEAN CATCH  Final   Special Requests NONE  Final   Culture   Final    >=100,000 COLONIES/mL KLEBSIELLA PNEUMONIAE Performed at [facility]    Report Status 10/25/2015 FINAL  Final   Organism ID, Bacteria KLEBSIELLA PNEUMONIAE  Final      Susceptibility   Klebsiella pneumoniae - MIC*    AMPICILLIN >=32 RESISTANT Resistant     CEFAZOLIN <=4 SENSITIVE Sensitive     CEFTRIAXONE <=1 SENSITIVE Sensitive     CIPROFLOXACIN <=0.25 SENSITIVE Sensitive     GENTAMICIN <=1 SENSITIVE Sensitive     IMIPENEM <=0.25 SENSITIVE Sensitive     NITROFURANTOIN 64 INTERMEDIATE Intermediate     TRIMETH/SULFA <=20 SENSITIVE Sensitive     AMPICILLIN/SULBACTAM 4 SENSITIVE Sensitive     PIP/TAZO <=4 SENSITIVE Sensitive     * >=100,000 COLONIES/mL KLEBSIELLA PNEUMONIAE   Studies/Results: No results found.  Medications:  Prior to Admission:  Prescriptions  prior to admission  Medication Sig Dispense Refill Last Dose  . acetaminophen (TYLENOL) 325 MG tablet Take 1-2 tablets (325-650 mg total) by mouth every 4 (four) hours as needed for mild pain.   unknown  . aspirin EC 81 MG tablet Take 81 mg by mouth daily.   10/21/2015 at Unknown time  . atorvastatin (LIPITOR) 40 MG tablet TAKE 1 TABLET BY MOUTH EVERY DAY 30 tablet 6 10/21/2015 at Unknown time  . carvedilol (COREG) 6.25 MG tablet Take 1 tablet (6.25 mg total) by mouth 2 (two) times daily. 180 tablet 3 10/21/2015 at 800  . docusate sodium (COLACE) 100 MG capsule Take 100 mg by mouth daily.   10/21/2015 at Unknown time  . doxazosin (CARDURA) 8 MG tablet Take 8 mg by mouth daily.   10/21/2015 at  Unknown time  . finasteride (PROSCAR) 5 MG tablet Take 5 mg by mouth daily.   10/21/2015 at Unknown time  . Multiple Vitamin (MULTIVITAMIN) tablet Take 1 tablet by mouth daily.   10/21/2015 at Unknown time  . nitroGLYCERIN (NITROSTAT) 0.4 MG SL tablet Place 0.4 mg under the tongue every 5 (five) minutes as needed for chest pain. May take up to 3 doses per episode. Contact MD or 911 if unresolved chest pain continues.   unknown  . oxyCODONE-acetaminophen (PERCOCET/ROXICET) 5-325 MG tablet Take 1 tablet by mouth 4 (four) times daily as needed. pain  0 10/22/2015 at 100am  . predniSONE (DELTASONE) 5 MG tablet Take 5 mg by mouth daily with breakfast.   10/21/2015 at Unknown time  . tamsulosin (FLOMAX) 0.4 MG CAPS capsule Take 1 capsule (0.4 mg total) by mouth daily after breakfast. 30 capsule 1 10/21/2015 at Unknown time   Scheduled: . aspirin EC  81 mg Oral Daily  . atorvastatin  40 mg Oral q1800  . bisacodyl  10 mg Rectal Once  . carvedilol  12.5 mg Oral BID WC  . cefTRIAXone (ROCEPHIN)  IV  1 g Intravenous Q24H  . docusate sodium  100 mg Oral Daily  . doxazosin  8 mg Oral Daily  . enoxaparin (LOVENOX) injection  40 mg Subcutaneous Q24H  . finasteride  5 mg Oral Daily  . insulin aspart  0-15 Units Subcutaneous TID WC  . methylPREDNISolone (SOLU-MEDROL) injection  60 mg Intravenous Q6H  . multivitamin with minerals  1 tablet Oral Daily  . polyethylene glycol  17 g Oral Daily  . tamsulosin  0.4 mg Oral QPC breakfast   Continuous:  ZOX:[id number]:acetaminophen **OR** acetaminophen, HYDROmorphone (DILAUDID) injection, methocarbamol (ROBAXIN)  IV, nitroGLYCERIN, ondansetron **OR** ondansetron (ZOFRAN) IV, oxyCODONE-acetaminophen  Assesment: He was admitted with intractable back pain. He is known to have cauda equina spinal cord injury. He has a urinary tract infection which is being treated. His blood pressure was up last night. Active Problems:   HLD (hyperlipidemia)   Essential hypertension    GASTROESOPHAGEAL REFLUX DISEASE   Intractable back pain   Back pain   Cauda equina spinal cord injury (HCC)   Infection of urinary tract    Plan: I'm not sure at this point if he is going to be able to go home or not. If he cannot ambulate he may have to stay in the hospital and be transferred to skilled care facility. I will increase his carvedilol for blood pressure but probably will have to increase it again    LOS: 4 days   Richardine Peppers L 10/27/2015, 9:51 AM

## 2015-10-27 NOTE — Progress Notes (Signed)
First shift RN states pt to be NPO after midnight per MD verbal order for EGD in the a.m.

## 2015-10-28 LAB — GLUCOSE, CAPILLARY
GLUCOSE-CAPILLARY: 161 mg/dL — AB (ref 65–99)
GLUCOSE-CAPILLARY: 126 mg/dL — AB (ref 65–99)
Glucose-Capillary: 166 mg/dL — ABNORMAL HIGH (ref 65–99)
Glucose-Capillary: 190 mg/dL — ABNORMAL HIGH (ref 65–99)

## 2015-10-28 MED ORDER — AMLODIPINE BESYLATE 5 MG PO TABS
5.0000 mg | ORAL_TABLET | Freq: Once | ORAL | Status: AC
  Administered 2015-10-28: 5 mg via ORAL
  Filled 2015-10-28: qty 1

## 2015-10-28 MED ORDER — PREDNISONE 20 MG PO TABS
60.0000 mg | ORAL_TABLET | Freq: Every day | ORAL | Status: DC
  Administered 2015-10-28 – 2015-10-29 (×2): 60 mg via ORAL
  Filled 2015-10-28 (×2): qty 3

## 2015-10-28 NOTE — Progress Notes (Signed)
Subjective: He says he feels better. The strength in his leg is improving. He still has significant pain but that's better also.  Objective: Vital signs in last 24 hours: Temp:  [98 F (36.7 C)-98.3 F (36.8 C)] 98 F (36.7 C) (04/02 0722) Pulse Rate:  [61-75] 65 (04/02 0722) Resp:  [20] 20 (04/02 0722) BP: (169-194)/(95-107) 175/95 mmHg (04/02 0722) SpO2:  [95 %-100 %] 100 % (04/02 0722) Weight change:  Last BM Date: 10/26/15  Intake/Output from previous day: 04/01 0701 - 04/02 0700 In: 480 [P.O.:480] Out: 1300 [Urine:1300]  PHYSICAL EXAM General appearance: alert, cooperative and mild distress Resp: clear to auscultation bilaterally Cardio: regular rate and rhythm, S1, S2 normal, no murmur, click, rub or gallop GI: soft, non-tender; bowel sounds normal; no masses,  no organomegaly Extremities: Wasting of both lower extremities. Strength and motion of his left leg is much improved  Lab Results:  Results for orders placed or performed during the hospital encounter of 10/22/15 (from the past 48 hour(s))  Glucose, capillary     Status: None   Collection Time: 10/26/15  1:02 PM  Result Value Ref Range   Glucose-Capillary 88 65 - 99 mg/dL  Glucose, capillary     Status: None   Collection Time: 10/26/15  4:17 PM  Result Value Ref Range   Glucose-Capillary 96 65 - 99 mg/dL  Glucose, capillary     Status: Abnormal   Collection Time: 10/27/15  7:35 AM  Result Value Ref Range   Glucose-Capillary 150 (H) 65 - 99 mg/dL  Glucose, capillary     Status: Abnormal   Collection Time: 10/27/15 11:33 AM  Result Value Ref Range   Glucose-Capillary 145 (H) 65 - 99 mg/dL  Glucose, capillary     Status: Abnormal   Collection Time: 10/27/15  4:43 PM  Result Value Ref Range   Glucose-Capillary 121 (H) 65 - 99 mg/dL  Glucose, capillary     Status: Abnormal   Collection Time: 10/27/15 10:14 PM  Result Value Ref Range   Glucose-Capillary 199 (H) 65 - 99 mg/dL   Comment 1 Notify RN    Comment 2 Document in Chart     ABGS No results for input(s): PHART, PO2ART, TCO2, HCO3 in the last 72 hours.  Invalid input(s): PCO2 CULTURES Recent Results (from the past 240 hour(s))  Urine culture     Status: None   Collection Time: 10/22/15  4:47 AM  Result Value Ref Range Status   Specimen Description URINE, CLEAN CATCH  Final   Special Requests NONE  Final   Culture   Final    >=100,000 COLONIES/mL KLEBSIELLA PNEUMONIAE Performed at [facility]    Report Status 10/25/2015 FINAL  Final   Organism ID, Bacteria KLEBSIELLA PNEUMONIAE  Final      Susceptibility   Klebsiella pneumoniae - MIC*    AMPICILLIN >=32 RESISTANT Resistant     CEFAZOLIN <=4 SENSITIVE Sensitive     CEFTRIAXONE <=1 SENSITIVE Sensitive     CIPROFLOXACIN <=0.25 SENSITIVE Sensitive     GENTAMICIN <=1 SENSITIVE Sensitive     IMIPENEM <=0.25 SENSITIVE Sensitive     NITROFURANTOIN 64 INTERMEDIATE Intermediate     TRIMETH/SULFA <=20 SENSITIVE Sensitive     AMPICILLIN/SULBACTAM 4 SENSITIVE Sensitive     PIP/TAZO <=4 SENSITIVE Sensitive     * >=100,000 COLONIES/mL KLEBSIELLA PNEUMONIAE   Studies/Results: No results found.  Medications:  Prior to Admission:  Prescriptions prior to admission  Medication Sig Dispense Refill Last Dose  .  acetaminophen (TYLENOL) 325 MG tablet Take 1-2 tablets (325-650 mg total) by mouth every 4 (four) hours as needed for mild pain.   unknown  . aspirin EC 81 MG tablet Take 81 mg by mouth daily.   10/21/2015 at Unknown time  . atorvastatin (LIPITOR) 40 MG tablet TAKE 1 TABLET BY MOUTH EVERY DAY 30 tablet 6 10/21/2015 at Unknown time  . carvedilol (COREG) 6.25 MG tablet Take 1 tablet (6.25 mg total) by mouth 2 (two) times daily. 180 tablet 3 10/21/2015 at 800  . docusate sodium (COLACE) 100 MG capsule Take 100 mg by mouth daily.   10/21/2015 at Unknown time  . doxazosin (CARDURA) 8 MG tablet Take 8 mg by mouth daily.   10/21/2015 at Unknown time  . finasteride (PROSCAR) 5 MG  tablet Take 5 mg by mouth daily.   10/21/2015 at Unknown time  . Multiple Vitamin (MULTIVITAMIN) tablet Take 1 tablet by mouth daily.   10/21/2015 at Unknown time  . nitroGLYCERIN (NITROSTAT) 0.4 MG SL tablet Place 0.4 mg under the tongue every 5 (five) minutes as needed for chest pain. May take up to 3 doses per episode. Contact MD or 911 if unresolved chest pain continues.   unknown  . oxyCODONE-acetaminophen (PERCOCET/ROXICET) 5-325 MG tablet Take 1 tablet by mouth 4 (four) times daily as needed. pain  0 10/22/2015 at 100am  . predniSONE (DELTASONE) 5 MG tablet Take 5 mg by mouth daily with breakfast.   10/21/2015 at Unknown time  . tamsulosin (FLOMAX) 0.4 MG CAPS capsule Take 1 capsule (0.4 mg total) by mouth daily after breakfast. 30 capsule 1 10/21/2015 at Unknown time   Scheduled: . aspirin EC  81 mg Oral Daily  . atorvastatin  40 mg Oral q1800  . bisacodyl  10 mg Rectal Once  . carvedilol  12.5 mg Oral BID WC  . cefTRIAXone (ROCEPHIN)  IV  1 g Intravenous Q24H  . docusate sodium  100 mg Oral Daily  . doxazosin  8 mg Oral Daily  . enoxaparin (LOVENOX) injection  40 mg Subcutaneous Q24H  . finasteride  5 mg Oral Daily  . insulin aspart  0-15 Units Subcutaneous TID WC  . multivitamin with minerals  1 tablet Oral Daily  . polyethylene glycol  17 g Oral Daily  . predniSONE  60 mg Oral Q breakfast  . tamsulosin  0.4 mg Oral QPC breakfast   Continuous:  ZOX:[id number]:acetaminophen **OR** acetaminophen, HYDROmorphone (DILAUDID) injection, methocarbamol (ROBAXIN)  IV, nitroGLYCERIN, ondansetron **OR** ondansetron (ZOFRAN) IV, oxyCODONE-acetaminophen  Assesment: He was admitted with intractable back pain. He has a history of multiple surgeries. He has had a cauda equina injury. He is doing better. His pain is better and the strength in his leg is better Active Problems:   HLD (hyperlipidemia)   Essential hypertension   GASTROESOPHAGEAL REFLUX DISEASE   Intractable back pain   Back pain   Cauda equina  spinal cord injury (HCC)   Infection of urinary tract    Plan: Change him to prednisone. Try again to see if he can get up. Potential discharge tomorrow    LOS: 5 days   Elsie Sakuma L 10/28/2015, 8:14 AM

## 2015-10-28 NOTE — Progress Notes (Signed)
2149 BP-172/92 Dr.DonDiego (on call) notified and order given for Norvasc 5mg  PO x 1 time only for high BP.

## 2015-10-29 ENCOUNTER — Inpatient Hospital Stay
Admission: RE | Admit: 2015-10-29 | Discharge: 2015-11-21 | Disposition: A | Discharge status: Nursing Home (Includes ICF) | Payer: No Typology Code available for payment source | Source: Ambulatory Visit | Attending: Pulmonary Disease | Admitting: Pulmonary Disease

## 2015-10-29 DIAGNOSIS — T17990A Other foreign object in respiratory tract, part unspecified in causing asphyxiation, initial encounter: Secondary | ICD-10-CM | POA: Diagnosis not present

## 2015-10-29 DIAGNOSIS — M545 Low back pain: Secondary | ICD-10-CM | POA: Diagnosis not present

## 2015-10-29 DIAGNOSIS — M109 Gout, unspecified: Secondary | ICD-10-CM | POA: Diagnosis present

## 2015-10-29 DIAGNOSIS — M5416 Radiculopathy, lumbar region: Secondary | ICD-10-CM | POA: Diagnosis present

## 2015-10-29 DIAGNOSIS — D509 Iron deficiency anemia, unspecified: Secondary | ICD-10-CM | POA: Diagnosis not present

## 2015-10-29 DIAGNOSIS — F419 Anxiety disorder, unspecified: Secondary | ICD-10-CM | POA: Diagnosis not present

## 2015-10-29 DIAGNOSIS — R0902 Hypoxemia: Secondary | ICD-10-CM | POA: Diagnosis not present

## 2015-10-29 DIAGNOSIS — Z7982 Long term (current) use of aspirin: Secondary | ICD-10-CM | POA: Diagnosis not present

## 2015-10-29 DIAGNOSIS — K592 Neurogenic bowel, not elsewhere classified: Secondary | ICD-10-CM | POA: Diagnosis not present

## 2015-10-29 DIAGNOSIS — M4806 Spinal stenosis, lumbar region: Secondary | ICD-10-CM | POA: Diagnosis present

## 2015-10-29 DIAGNOSIS — T84226A Displacement of internal fixation device of vertebrae, initial encounter: Secondary | ICD-10-CM | POA: Diagnosis present

## 2015-10-29 DIAGNOSIS — Y33XXXD Other specified events, undetermined intent, subsequent encounter: Secondary | ICD-10-CM | POA: Diagnosis not present

## 2015-10-29 DIAGNOSIS — M4316 Spondylolisthesis, lumbar region: Secondary | ICD-10-CM | POA: Diagnosis present

## 2015-10-29 DIAGNOSIS — N400 Enlarged prostate without lower urinary tract symptoms: Secondary | ICD-10-CM | POA: Diagnosis present

## 2015-10-29 DIAGNOSIS — I10 Essential (primary) hypertension: Secondary | ICD-10-CM | POA: Diagnosis not present

## 2015-10-29 DIAGNOSIS — G8918 Other acute postprocedural pain: Secondary | ICD-10-CM | POA: Diagnosis not present

## 2015-10-29 DIAGNOSIS — M1990 Unspecified osteoarthritis, unspecified site: Secondary | ICD-10-CM | POA: Diagnosis not present

## 2015-10-29 DIAGNOSIS — D638 Anemia in other chronic diseases classified elsewhere: Secondary | ICD-10-CM | POA: Diagnosis present

## 2015-10-29 DIAGNOSIS — R278 Other lack of coordination: Secondary | ICD-10-CM | POA: Diagnosis not present

## 2015-10-29 DIAGNOSIS — N390 Urinary tract infection, site not specified: Secondary | ICD-10-CM | POA: Diagnosis not present

## 2015-10-29 DIAGNOSIS — S343XXD Injury of cauda equina, subsequent encounter: Secondary | ICD-10-CM | POA: Diagnosis not present

## 2015-10-29 DIAGNOSIS — F4320 Adjustment disorder, unspecified: Secondary | ICD-10-CM | POA: Diagnosis not present

## 2015-10-29 DIAGNOSIS — K219 Gastro-esophageal reflux disease without esophagitis: Secondary | ICD-10-CM | POA: Diagnosis not present

## 2015-10-29 DIAGNOSIS — M21372 Foot drop, left foot: Secondary | ICD-10-CM | POA: Diagnosis present

## 2015-10-29 DIAGNOSIS — S32029A Unspecified fracture of second lumbar vertebra, initial encounter for closed fracture: Secondary | ICD-10-CM | POA: Diagnosis present

## 2015-10-29 DIAGNOSIS — M549 Dorsalgia, unspecified: Secondary | ICD-10-CM | POA: Diagnosis not present

## 2015-10-29 DIAGNOSIS — M5116 Intervertebral disc disorders with radiculopathy, lumbar region: Secondary | ICD-10-CM | POA: Diagnosis present

## 2015-10-29 DIAGNOSIS — Z79899 Other long term (current) drug therapy: Secondary | ICD-10-CM | POA: Diagnosis not present

## 2015-10-29 DIAGNOSIS — M6281 Muscle weakness (generalized): Secondary | ICD-10-CM | POA: Diagnosis not present

## 2015-10-29 DIAGNOSIS — R32 Unspecified urinary incontinence: Secondary | ICD-10-CM | POA: Diagnosis not present

## 2015-10-29 DIAGNOSIS — G9741 Accidental puncture or laceration of dura during a procedure: Secondary | ICD-10-CM | POA: Diagnosis not present

## 2015-10-29 DIAGNOSIS — M5136 Other intervertebral disc degeneration, lumbar region: Secondary | ICD-10-CM | POA: Diagnosis not present

## 2015-10-29 DIAGNOSIS — Z981 Arthrodesis status: Secondary | ICD-10-CM | POA: Diagnosis not present

## 2015-10-29 DIAGNOSIS — S32029D Unspecified fracture of second lumbar vertebra, subsequent encounter for fracture with routine healing: Secondary | ICD-10-CM | POA: Diagnosis not present

## 2015-10-29 DIAGNOSIS — R262 Difficulty in walking, not elsewhere classified: Secondary | ICD-10-CM | POA: Diagnosis not present

## 2015-10-29 DIAGNOSIS — R29898 Other symptoms and signs involving the musculoskeletal system: Secondary | ICD-10-CM | POA: Diagnosis not present

## 2015-10-29 DIAGNOSIS — E785 Hyperlipidemia, unspecified: Secondary | ICD-10-CM | POA: Diagnosis not present

## 2015-10-29 DIAGNOSIS — F329 Major depressive disorder, single episode, unspecified: Secondary | ICD-10-CM | POA: Diagnosis not present

## 2015-10-29 DIAGNOSIS — I2510 Atherosclerotic heart disease of native coronary artery without angina pectoris: Secondary | ICD-10-CM | POA: Diagnosis present

## 2015-10-29 DIAGNOSIS — G8929 Other chronic pain: Secondary | ICD-10-CM | POA: Diagnosis present

## 2015-10-29 DIAGNOSIS — M5126 Other intervertebral disc displacement, lumbar region: Secondary | ICD-10-CM | POA: Diagnosis not present

## 2015-10-29 LAB — GLUCOSE, CAPILLARY: GLUCOSE-CAPILLARY: 89 mg/dL (ref 65–99)

## 2015-10-29 LAB — CREATININE, SERUM
CREATININE: 1 mg/dL (ref 0.61–1.24)
GFR calc non Af Amer: >60 mL/min (ref >60)

## 2015-10-29 MED ORDER — CEFUROXIME AXETIL 250 MG PO TABS: 250.0000 mg | ORAL_TABLET | Freq: Two times a day (BID) | ORAL | Status: DC

## 2015-10-29 MED ORDER — PREDNISONE 20 MG PO TABS: 60.0000 mg | ORAL_TABLET | Freq: Every day | ORAL | Status: DC

## 2015-10-29 MED ORDER — CARVEDILOL 25 MG PO TABS: 25.0000 mg | ORAL_TABLET | Freq: Two times a day (BID) | ORAL | Status: DC

## 2015-10-29 NOTE — Care Management (Signed)
Called to room. Patient and spouse present. Patient sitting in wheelchair. Spouse and patient stated to me that that he cannot go home and that his leg doesn't work. Patient spouse stated that she cannot take care of him like this. Told patient that I would contact Dr Juanetta GoslingHawkins and see if we could get a PT evaluation. Dr Juanetta GoslingHawkins held discharge for PT evaluation.  More to follow.

## 2015-10-29 NOTE — Progress Notes (Signed)
Norvasc 5mg  PO x 1 time order given as ordered for BP-172/92, f/u BP 164/67.

## 2015-10-29 NOTE — Discharge Summary (Addendum)
Physician Discharge Summary  Patient ID: [name] MRN: [id number] DOB/AGE: November 21, 1940 75 y.o. Primary Care Physician:Morene Cecilio L, MD Admit date: 10/22/2015 Discharge date: 10/29/2015    Discharge Diagnoses:   Active Problems:   HLD (hyperlipidemia)   Essential hypertension   GASTROESOPHAGEAL REFLUX DISEASE   Intractable back pain   Back pain   Cauda equina spinal cord injury (HCC)   Infection of urinary tract     Medication List    TAKE these medications        acetaminophen 325 MG tablet  Commonly known as:  TYLENOL  Take 1-2 tablets (325-650 mg total) by mouth every 4 (four) hours as needed for mild pain.     aspirin EC 81 MG tablet  Take 81 mg by mouth daily.     atorvastatin 40 MG tablet  Commonly known as:  LIPITOR  TAKE 1 TABLET BY MOUTH EVERY DAY     carvedilol 25 MG tablet  Commonly known as:  COREG  Take 1 tablet (25 mg total) by mouth 2 (two) times daily with a meal.     cefUROXime 250 MG tablet  Commonly known as:  CEFTIN  Take 1 tablet (250 mg total) by mouth 2 (two) times daily with a meal.     docusate sodium 100 MG capsule  Commonly known as:  COLACE  Take 100 mg by mouth daily.     doxazosin 8 MG tablet  Commonly known as:  CARDURA  Take 8 mg by mouth daily.     finasteride 5 MG tablet  Commonly known as:  PROSCAR  Take 5 mg by mouth daily.     multivitamin tablet  Take 1 tablet by mouth daily.     nitroGLYCERIN 0.4 MG SL tablet  Commonly known as:  NITROSTAT  Place 0.4 mg under the tongue every 5 (five) minutes as needed for chest pain. May take up to 3 doses per episode. Contact MD or 911 if unresolved chest pain continues.     oxyCODONE-acetaminophen 5-325 MG tablet  Commonly known as:  PERCOCET/ROXICET  Take 1 tablet by mouth 4 (four) times daily as needed. pain     predniSONE 20 MG tablet  Commonly known as:  DELTASONE  Take 3 tablets (60 mg total) by mouth daily with breakfast.     tamsulosin 0.4 MG Caps capsule   Commonly known as:  FLOMAX  Take 1 capsule (0.4 mg total) by mouth daily after breakfast.        Discharged Condition:Improved    Consults: None  Significant Diagnostic Studies: Ct Lumbar Spine Wo Contrast  10/22/2015  CLINICAL DATA:  Acute onset of worsening lower back pain. Numbness, burning and pain along the left leg and foot. Initial encounter. EXAM: CT LUMBAR SPINE WITHOUT CONTRAST TECHNIQUE: Multidetector CT imaging of the lumbar spine was performed without intravenous contrast administration. Multiplanar CT image reconstructions were also generated. COMPARISON:  MRI of the lumbar spine performed 09/05/2013 FINDINGS: There is no evidence of fracture or subluxation along the lumbar spine. Vertebral bodies demonstrate normal height. The patient is status post lumbar spinal fusion at L2-S1, with underlying decompression. There is mild grade 1 retrolisthesis of L2 on L3, and disc space narrowing is noted at L4-L5 and L5-S1, with scattered anterior and lateral osteophytes. There is underlying bony foraminal narrowing bilaterally at L5-S1. Diffuse vascular calcifications are seen. The paraspinal musculature appears grossly intact. Vascular perinephric stranding is noted bilaterally. Scattered calcified granulomata are seen within the spleen. IMPRESSION: 1. No  acute abnormality seen to explain the patient's symptoms. 2. Status post lumbar spinal fusion at L2-S1, with underlying decompression. 3. Mild degenerative change along the lumbar spine. 4. Diffuse vascular calcifications seen. Electronically Signed   By: Roanna Raider M.D.   On: 10/22/2015 05:27   Mr Lumbar Spine Wo Contrast  10/22/2015  CLINICAL DATA:  Severe low back pain. Lumbar surgery x 3. The examination had to be discontinued prior to completion due to patient refusal for further imaging. EXAM: MRI LUMBAR SPINE WITHOUT CONTRAST TECHNIQUE: Multiplanar, multisequence MR imaging of the lumbar spine was performed. No intravenous  contrast was administered. COMPARISON:  CT of the lumbar spine 10/22/2015. MRI of the lumbar spine 09/05/2013. FINDINGS: Study is moderately degraded by patient motion. Normal signal is present in the conus medullaris which terminates at L1, within normal limits. Pedicle screw and rod fixation is evident L2-S1. Bilateral pedicle screws are present with the exception of L4 on the right. Fusion at L4-5 is stable. There is some exposure of the disc spacer on the left, unchanged. The axial images are nondiagnostic. No significant interval change is a prominent inferior disc extrusion at L2-3. This extends 13 mm below the superior endplate of L3. Moderate central and bilateral foraminal stenosis results. Severe left and moderate right foraminal stenosis is again noted L3-4. Moderate left foraminal stenosis is again noted at L4-5 and L5-S1. The L1-2 level is unremarkable. IMPRESSION: 1. New inferior disc extrusion centrally at L2-3 resulting in moderate central and bilateral foraminal stenosis, new since prior exam. 2. Moderate to severe left foraminal stenosis at L3-4 and L4-5. Moderate left foraminal narrowing at L5-S1. 3. Posterior pedicle screw and rod fixation L2-S1. Electronically Signed   By: Marin Roberts M.D.   On: 10/22/2015 07:35    Lab Results: Basic Metabolic Panel:  Recent Labs  16/10/96 0511  CREATININE 1.00   Liver Function Tests: No results for input(s): AST, ALT, ALKPHOS, BILITOT, PROT, ALBUMIN in the last 72 hours.   CBC: No results for input(s): WBC, NEUTROABS, HGB, HCT, MCV, PLT in the last 72 hours.  Recent Results (from the past 240 hour(s))  Urine culture     Status: None   Collection Time: 10/22/15  4:47 AM  Result Value Ref Range Status   Specimen Description URINE, CLEAN CATCH  Final   Special Requests NONE  Final   Culture   Final    >=100,000 COLONIES/mL KLEBSIELLA PNEUMONIAE Performed at [facility]    Report Status 10/25/2015 FINAL  Final    Organism ID, Bacteria KLEBSIELLA PNEUMONIAE  Final      Susceptibility   Klebsiella pneumoniae - MIC*    AMPICILLIN >=32 RESISTANT Resistant     CEFAZOLIN <=4 SENSITIVE Sensitive     CEFTRIAXONE <=1 SENSITIVE Sensitive     CIPROFLOXACIN <=0.25 SENSITIVE Sensitive     GENTAMICIN <=1 SENSITIVE Sensitive     IMIPENEM <=0.25 SENSITIVE Sensitive     NITROFURANTOIN 64 INTERMEDIATE Intermediate     TRIMETH/SULFA <=20 SENSITIVE Sensitive     AMPICILLIN/SULBACTAM 4 SENSITIVE Sensitive     PIP/TAZO <=4 SENSITIVE Sensitive     * >=100,000 COLONIES/mL KLEBSIELLA PNEUMONIAE     Hospital Course: This is a 75 year old who came to the emergency department with intractable back pain. 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 He's had multiple surgeries and had a cauda equina injury from which he has been recovering. He developed weakness of his left leg as well. MRI showed a new  ruptured disc. Telephone consultation with his neurosurgeon showed that she did not feel like he was an operative candidate at this time. He was started on IV steroids and improved. He had a urinary tract infection which was treated. His blood pressure has been up which I think is a combination of steroids and pain that has been treated he is improving as far as his weakness is concerned and ready for discharge pain is well controlled  Discharge Exam: Blood pressure 178/61, pulse 57, temperature 98.7 F (37.1 C), temperature source Oral, resp. rate 20, height 6' (1.829 m), weight 81.647 kg (180 lb), SpO2 96 %. He is awake and alert. He still has weakness in his legs bilaterally the left is worse than the right  Disposition: Home with home health services      Discharge Instructions    Face-to-face encounter (required for Medicare/Medicaid patients)    Complete by:  As directed   I Raheem Kolbe L certify that this patient is under my care and that I, or a nurse practitioner or physician's assistant working with me,  had a face-to-face encounter that meets the physician face-to-face encounter requirements with this patient on 10/29/2015. The encounter with the patient was in whole, or in part for the following medical condition(s) which is the primary reason for home health care (List medical condition): Intractable back pain  The encounter with the patient was in whole, or in part, for the following medical condition, which is the primary reason for home health care:  Intractable back pain  I certify that, based on my findings, the following services are medically necessary home health services:   Physical therapy Nursing    Reason for Medically Necessary Home Health Services:  Skilled Nursing- Change/Decline in Patient Status  My clinical findings support the need for the above services:  Pain interferes with ambulation/mobility  Further, I certify that my clinical findings support that this patient is homebound due to:  Pain interferes with ambulation/mobility     Home Health    Complete by:  As directed   To provide the following care/treatments:   PT RN               Signed: Karsen Nakanishi L   10/29/2015, 8:41 AM  After this was done he tried to get up but was very weak. Discharge cancelled. He will be going to SNF now.

## 2015-10-29 NOTE — Progress Notes (Addendum)
Patient accepted to [facility] Patient can discharge today to the facility per facility. Patient is agreeable and wife was contacted and made aware of plan. MD to update DC order.  All clinicals sent to facility.  Patient will be transported by hospital staff.   Patient was a planned discharge home today, but when patient was leaving in wheelchair he reported he could not walk. Wife reported she was not able to take care of him and patient began refusing to go home.  PT has already been ordered and evaluated recommending OPT physical therapy.  PT has been reordered and DC held today per CM.  LCSW did work patient up for potential placement: SNF. [facility] complete FL2 complete, but needs to be signed by MD. [facility] [place] [facility] [facility].   Disposition is pending at this time.  Deretha EmoryHannah Liannah Yarbough LCSW, MSW Clinical Social Work: [employer] [phone]

## 2015-10-29 NOTE — Care Management Note (Addendum)
Case Management Note  Patient Details  Name: Ignacia MarvelRonald W Weible MRN: [id number] Date of Birth: 10/02/1940  Subjective/Objective:      Spoke with patient who is from home with spouse and alert oriented. Patient deconditioned and will be home bound at discharge. Reports that he has a walker and a wheelchair at home. Patient denies difficulty with medications.   [facility] to [facility] prior.             Action/Plan: Home with Home health PT RN   Expected Discharge Date:                  Expected Discharge Plan:  Home w Home Health Services  In-House Referral:     Discharge planning Services  CM Consult  Post Acute Care Choice:    Choice offered to:  Patient  DME Arranged:    DME Agency:     HH Arranged:  RN, PT HH Agency:  [facility]  Status of Service:  Completed, signed off  Medicare Important Message Given:  Yes Date Medicare IM Given:    Medicare IM give by:    Date Additional Medicare IM Given:    Additional Medicare Important Message give by:     If discussed at Long Length of Stay Meetings, dates discussed:    Additional Comments:  Adonis HugueninBerkhead, Keonia Pasko L, RN 10/29/2015, 9:22 AM

## 2015-10-29 NOTE — Progress Notes (Signed)
Subjective: He feels better and wants to go home.  Objective: Vital signs in last 24 hours: Temp:  [98.6 F (37 C)-98.8 F (37.1 C)] 98.7 F (37.1 C) (04/03 0529) Pulse Rate:  [57-72] 57 (04/03 0529) Resp:  [17-20] 20 (04/03 0529) BP: (164-184)/(61-92) 178/61 mmHg (04/03 0529) SpO2:  [96 %-98 %] 96 % (04/03 0529) Weight change:  Last BM Date: 10/26/15  Intake/Output from previous day: 04/02 0701 - 04/03 0700 In: 770 [P.O.:720; IV Piggyback:50] Out: 6433 [Urine:1275]  PHYSICAL EXAM General appearance: alert, cooperative and no distress Resp: clear to auscultation bilaterally Cardio: regular rate and rhythm, S1, S2 normal, no murmur, click, rub or gallop GI: soft, non-tender; bowel sounds normal; no masses,  no organomegaly Extremities: Wasting of the extremities in his legs and he has weakness of his left leg  Lab Results:  Results for orders placed or performed during the hospital encounter of 10/22/15 (from the past 48 hour(s))  Glucose, capillary     Status: Abnormal   Collection Time: 10/27/15 11:33 AM  Result Value Ref Range   Glucose-Capillary 145 (H) 65 - 99 mg/dL  Glucose, capillary     Status: Abnormal   Collection Time: 10/27/15  4:43 PM  Result Value Ref Range   Glucose-Capillary 121 (H) 65 - 99 mg/dL  Glucose, capillary     Status: Abnormal   Collection Time: 10/27/15 10:14 PM  Result Value Ref Range   Glucose-Capillary 199 (H) 65 - 99 mg/dL   Comment 1 Notify RN    Comment 2 Document in Chart   Glucose, capillary     Status: Abnormal   Collection Time: 10/28/15  8:06 AM  Result Value Ref Range   Glucose-Capillary 166 (H) 65 - 99 mg/dL  Glucose, capillary     Status: Abnormal   Collection Time: 10/28/15 11:23 AM  Result Value Ref Range   Glucose-Capillary 190 (H) 65 - 99 mg/dL  Glucose, capillary     Status: Abnormal   Collection Time: 10/28/15  4:34 PM  Result Value Ref Range   Glucose-Capillary 161 (H) 65 - 99 mg/dL  Glucose, capillary     Status:  Abnormal   Collection Time: 10/28/15 10:04 PM  Result Value Ref Range   Glucose-Capillary 126 (H) 65 - 99 mg/dL   Comment 1 Notify RN    Comment 2 Document in Chart   Creatinine, serum     Status: None   Collection Time: 10/29/15  5:11 AM  Result Value Ref Range   Creatinine, Ser 1.00 0.61 - 1.24 mg/dL   GFR calc non Af Amer >60 >60 mL/min   GFR calc Af Amer >60 >60 mL/min    Comment: (NOTE) The eGFR has been calculated using the CKD EPI equation. This calculation has not been validated in all clinical situations. eGFR's persistently <60 mL/min signify possible Chronic Kidney Disease.   Glucose, capillary     Status: None   Collection Time: 10/29/15  7:53 AM  Result Value Ref Range   Glucose-Capillary 89 65 - 99 mg/dL    ABGS No results for input(s): PHART, PO2ART, TCO2, HCO3 in the last 72 hours.  Invalid input(s): PCO2 CULTURES Recent Results (from the past 240 hour(s))  Urine culture     Status: None   Collection Time: 10/22/15  4:47 AM  Result Value Ref Range Status   Specimen Description URINE, CLEAN CATCH  Final   Special Requests NONE  Final   Culture   Final    >=100,000 COLONIES/mL  KLEBSIELLA PNEUMONIAE Performed at [facility]    Report Status 10/25/2015 FINAL  Final   Organism ID, Bacteria KLEBSIELLA PNEUMONIAE  Final      Susceptibility   Klebsiella pneumoniae - MIC*    AMPICILLIN >=32 RESISTANT Resistant     CEFAZOLIN <=4 SENSITIVE Sensitive     CEFTRIAXONE <=1 SENSITIVE Sensitive     CIPROFLOXACIN <=0.25 SENSITIVE Sensitive     GENTAMICIN <=1 SENSITIVE Sensitive     IMIPENEM <=0.25 SENSITIVE Sensitive     NITROFURANTOIN 64 INTERMEDIATE Intermediate     TRIMETH/SULFA <=20 SENSITIVE Sensitive     AMPICILLIN/SULBACTAM 4 SENSITIVE Sensitive     PIP/TAZO <=4 SENSITIVE Sensitive     * >=100,000 COLONIES/mL KLEBSIELLA PNEUMONIAE   Studies/Results: No results found.  Medications:  Prior to Admission:  Prescriptions prior to admission   Medication Sig Dispense Refill Last Dose  . acetaminophen (TYLENOL) 325 MG tablet Take 1-2 tablets (325-650 mg total) by mouth every 4 (four) hours as needed for mild pain.   unknown  . aspirin EC 81 MG tablet Take 81 mg by mouth daily.   10/21/2015 at Unknown time  . atorvastatin (LIPITOR) 40 MG tablet TAKE 1 TABLET BY MOUTH EVERY DAY 30 tablet 6 10/21/2015 at Unknown time  . carvedilol (COREG) 6.25 MG tablet Take 1 tablet (6.25 mg total) by mouth 2 (two) times daily. 180 tablet 3 10/21/2015 at 800  . docusate sodium (COLACE) 100 MG capsule Take 100 mg by mouth daily.   10/21/2015 at Unknown time  . doxazosin (CARDURA) 8 MG tablet Take 8 mg by mouth daily.   10/21/2015 at Unknown time  . finasteride (PROSCAR) 5 MG tablet Take 5 mg by mouth daily.   10/21/2015 at Unknown time  . Multiple Vitamin (MULTIVITAMIN) tablet Take 1 tablet by mouth daily.   10/21/2015 at Unknown time  . nitroGLYCERIN (NITROSTAT) 0.4 MG SL tablet Place 0.4 mg under the tongue every 5 (five) minutes as needed for chest pain. May take up to 3 doses per episode. Contact MD or 911 if unresolved chest pain continues.   unknown  . oxyCODONE-acetaminophen (PERCOCET/ROXICET) 5-325 MG tablet Take 1 tablet by mouth 4 (four) times daily as needed. pain  0 10/22/2015 at 100am  . predniSONE (DELTASONE) 5 MG tablet Take 5 mg by mouth daily with breakfast.   10/21/2015 at Unknown time  . tamsulosin (FLOMAX) 0.4 MG CAPS capsule Take 1 capsule (0.4 mg total) by mouth daily after breakfast. 30 capsule 1 10/21/2015 at Unknown time   Scheduled: . aspirin EC  81 mg Oral Daily  . atorvastatin  40 mg Oral q1800  . bisacodyl  10 mg Rectal Once  . carvedilol  12.5 mg Oral BID WC  . cefTRIAXone (ROCEPHIN)  IV  1 g Intravenous Q24H  . docusate sodium  100 mg Oral Daily  . doxazosin  8 mg Oral Daily  . enoxaparin (LOVENOX) injection  40 mg Subcutaneous Q24H  . finasteride  5 mg Oral Daily  . insulin aspart  0-15 Units Subcutaneous TID WC  . multivitamin  with minerals  1 tablet Oral Daily  . polyethylene glycol  17 g Oral Daily  . predniSONE  60 mg Oral Q breakfast  . tamsulosin  0.4 mg Oral QPC breakfast   Continuous:  PFX:[id number] **OR** acetaminophen, HYDROmorphone (DILAUDID) injection, methocarbamol (ROBAXIN)  IV, nitroGLYCERIN, ondansetron **OR** ondansetron (ZOFRAN) IV, oxyCODONE-acetaminophen  Assesment: He had intractable back pain related to disc disease and spinal stenosis. He had previous  back surgery and had a cauda equina injury. He has a UTI which is improving. He is improved enough I think he can go home now Active Problems:   HLD (hyperlipidemia)   Essential hypertension   GASTROESOPHAGEAL REFLUX DISEASE   Intractable back pain   Back pain   Cauda equina spinal cord injury ([place])   Infection of urinary tract    Plan: Discharge home with home health services    LOS: 6 days   Christopherjohn Schiele L 10/29/2015, 8:35 AM

## 2015-10-29 NOTE — Care Management (Signed)
[facility]

## 2015-10-29 NOTE — Care Management Important Message (Signed)
Important Message  Patient Details  Name: [name] MRN: [id number] Date of Birth: 10/21/1940   Medicare Important Message Given:  Yes    Adonis HugueninBerkhead, Anasofia Micallef L, RN 10/29/2015, 10:03 AM

## 2015-10-29 NOTE — Progress Notes (Addendum)
Physical Therapy Treatment Patient Details Name: [name] MRN: [id number] DOB: November 13, 1940 Today's Date: 10/29/2015    History of Present Illness [name] is a 75 y.o. male who presents to the hospital with complaints of back pain. Patient is a very functional 75 year old gentleman who works out at [employer] regularly and is independent/driving. Patient reports that yesterday while he was laying down he developed left lower back pain. It was shooting down his left leg. He denies any trauma to his back, didn't denies lifting any heavy objects. He did not exert himself out of the ordinary. He has chronic urinary retention and self caths. He also has issues with constipation and often requires suppositories and manual disimpactions. The patient does have chronic back issues and has undergone back surgery in the past at [facility]. Due to intractable pain, he's been referred for admission.    PT Comments    Pt able to transfer with modified independence due to weakness in lt LE and inability to move it without UE assistance.  Pt with difficulty transferring to stand and ambulating today also due to Lt LE weakness.  Pt required min assist to stand to walker and able to take 1 step forward before requesting to sit back down.  Pt with buckling noted in Lt LE with weight bearing and voiced he was fearful to attempt any further ambulation until Lt LE is stronger.  Pt also expressed concern with ability of wife to help if he returns home.  Discussed with acute therapist and case manager and agree on SNF placement at this time to improve strength before returning home.     Follow Up Recommendations  SNF     Equipment Recommendations       Recommendations for Other Services       Precautions / Restrictions Precautions Precautions: Fall    Mobility  Bed Mobility Overal bed mobility: Modified Independent             General bed mobility comments: increased time and use of UE  to assist Lt LE  Transfers Overall transfer level: Needs assistance Equipment used: Rolling walker (2 wheeled) Transfers: Sit to/from Stand Sit to Stand: Min assist         General transfer comment: Pt requiring increased assistance today as Lt LE is weak and "giving way" with activity  Ambulation/Gait Ambulation/Gait assistance: Mod assist Ambulation Distance (Feet): 2 Feet Assistive device: Rolling walker (2 wheeled) Gait Pattern/deviations: Step-to pattern;Decreased stance time - left   Gait velocity interpretation: <1.8 ft/sec, indicative of risk for recurrent falls     Stairs            Wheelchair Mobility    Modified Rankin (Stroke Patients Only)       Balance                                    Cognition Arousal/Alertness: Awake/alert Behavior During Therapy: WFL for tasks assessed/performed Overall Cognitive Status: Within Functional Limits for tasks assessed                      Exercises      General Comments        Pertinent Vitals/Pain Pain Assessment: 0-10 Pain Score: 7  Pain Location: lower back Pain Descriptors / Indicators: Aching Pain Intervention(s): Premedicated before session;Repositioned    Home Living  Prior Function            PT Goals (current goals can now be found in the care plan section)      Frequency       PT Plan Discharge plan needs to be updated for change of venue to SNF    Co-evaluation             End of Session Equipment Utilized During Treatment: Gait belt Activity Tolerance: Other (comment) (limited by weakness) Patient left: in bed     Time: 1500-1530 PT Time Calculation (min) (ACUTE ONLY): 30 min  Charges:  $Therapeutic Activity: 8-22 mins                    G CodesLurena Nida:      Amy B Frazier, PTA/CLT [phone] 10/29/2015, 4:12 PM    As the supervising physical therapist, I have review the above information and am agreeable with the  updated discharge recommendations as described.  6:17 PM, 10/29/2015 Rosamaria LintsAllan C Ramzi Brathwaite, PT, DPT PRN Physical Therapist at [facility] License # [id number] [phone] (wireless)  [phone] (mobile)

## 2015-10-29 NOTE — [place] FL2 (Signed)
[place] MEDICAID FL2 LEVEL OF CARE SCREENING TOOL     IDENTIFICATION  Patient Name: Jake MarvelRonald W Droz Birthdate: 09/05/1940 Sex: male Admission Date (Current Location): 10/22/2015  [facility] [place]  [place]   Facility and Address:  [facility],  [address], [place] [id number]      Provider Number: [id number]  Attending Physician Name and Address:  Kari BaarsEdward Hawkins, MD  Relative Name and Phone Number:       Current Level of Care: Hospital Recommended Level of Care: Skilled Nursing Facility Prior Approval Number:    Date Approved/Denied:   PASRR Number:   SS# [id number] [place] Num:  [id number]  Discharge Plan: SNF    Current Diagnoses: Patient Active Problem List   Diagnosis Date Noted  . 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 Infection of urinary tract 10/26/2015  . Intractable back pain 10/22/2015  . Back pain 10/22/2015  . Muscle weakness (generalized) 12/05/2013  . Tight fascia 12/05/2013  . Decreased range of motion of right shoulder 12/05/2013  . Decreased range of motion of left shoulder 12/05/2013  . Bilateral leg weakness 11/21/2013  . Poor balance 11/21/2013  . Difficulty in walking(719.7) 11/21/2013  . Shoulder pain, left 09/12/2013  . Anemia, iron deficiency 09/09/2013  . Dyspnea 09/07/2013  . UTI (urinary tract infection) 09/07/2013  . Acute CHF (congestive heart failure) (HCC) 09/07/2013  . Elevated troponin 09/07/2013  . Fever and chills 09/06/2013  . Altered mental status 09/05/2013  . Neurogenic bowel 08/22/2013  . 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 Gout flare 08/22/2013  . Postoperative wound infection 08/22/2013  . Lumbar degenerative disc disease 08/02/2013  . Post-operative pain 07/29/2013  . Osteonecrosis (HCC) 05/05/2013  . Small bowel obstruction (HCC) 04/17/2013  . Acute renal failure (HCC) 04/17/2013  . Medial meniscus, posterior horn derangement 02/17/2013   . Knee sprain and strain 02/17/2013  . Trigger point with neck pain 03/18/2012  . Rotator cuff tear arthropathy of right shoulder 09/08/2011  . CAD in native artery 01/09/2011  . Ankle fracture 12/25/2010  . Contusion of shoulder, left 12/25/2010  . PEMPHIGUS VULGARIS 07/02/2010  . MRSA 05/13/2010  . PRURITUS 05/13/2010  . SPINAL STENOSIS, LUMBAR 05/13/2010  . HLD (hyperlipidemia) 04/24/2010  . GASTROESOPHAGEAL REFLUX DISEASE 04/24/2010  . VENTRAL HERNIA, INCISIONAL 04/24/2010  . DEGENERATIVE JOINT DISEASE 04/24/2010  . PYOGENIC ARTHRITIS, SHOULDER REGION 02/04/2010  . Essential hypertension 01/01/2010  . RUPTURE ROTATOR CUFF 02/19/2009    Orientation RESPIRATION BLADDER Height & Weight     Self, Time, Situation  Normal Continent Weight: 180 lb (81.647 kg) Height:  6' (182.9 cm)  BEHAVIORAL SYMPTOMS/MOOD NEUROLOGICAL BOWEL NUTRITION STATUS  Other (Comment) (none)   Continent Diet (regular)  AMBULATORY STATUS COMMUNICATION OF NEEDS Skin   Extensive Assist Verbally Normal                       Personal Care Assistance Level of Assistance  Bathing, Feeding, Dressing Bathing Assistance: Limited assistance Feeding assistance: Limited assistance Dressing Assistance: Limited assistance     Functional Limitations Info  Sight, Hearing, Speech Sight Info: Adequate Hearing Info: Adequate Speech Info: Adequate    SPECIAL CARE FACTORS FREQUENCY  PT (By licensed PT), OT (By licensed OT)     PT Frequency: 5 OT Frequency: 5            Contractures Contractures Info: Not present    Additional Factors Info  Code Status, Allergies,  Psychotropic, Insulin Sliding Scale, Isolation Precautions Code Status Info: Full Allergies Info: Morphine, Metoprolol, Rifampin Psychotropic Info: none Insulin Sliding Scale Info: 0-15 units 3x a day Isolation Precautions Info: none     Current Medications (10/29/2015):  This is the current hospital active medication list Current  Facility-Administered Medications  Medication Dose Route Frequency Provider Last Rate Last Dose  . acetaminophen (TYLENOL) tablet 650 mg  650 mg Oral Q6H PRN Erick Blinks, MD       Or  . acetaminophen (TYLENOL) suppository 650 mg  650 mg Rectal Q6H PRN Erick Blinks, MD      . aspirin EC tablet 81 mg  81 mg Oral Daily Erick Blinks, MD   81 mg at 10/29/15 1610  . atorvastatin (LIPITOR) tablet 40 mg  40 mg Oral q1800 Erick Blinks, MD   40 mg at 10/28/15 1710  . bisacodyl (DULCOLAX) suppository 10 mg  10 mg Rectal Once Erick Blinks, MD   10 mg at 10/23/15 1745  . carvedilol (COREG) tablet 12.5 mg  12.5 mg Oral BID WC [name], MD   12.5 mg at 10/29/15 0839  . cefTRIAXone (ROCEPHIN) 1 g in dextrose 5 % 50 mL IVPB  1 g Intravenous Q24H [name], MD   1 g at 10/29/15 9604  . docusate sodium (COLACE) capsule 100 mg  100 mg Oral Daily Erick Blinks, MD   100 mg at 10/29/15 0839  . doxazosin (CARDURA) tablet 8 mg  8 mg Oral Daily Erick Blinks, MD   8 mg at 10/29/15 0840  . enoxaparin (LOVENOX) injection 40 mg  40 mg Subcutaneous Q24H Erick Blinks, MD   40 mg at 10/28/15 1710  . finasteride (PROSCAR) tablet 5 mg  5 mg Oral Daily Erick Blinks, MD   5 mg at 10/29/15 0839  . HYDROmorphone (DILAUDID) injection 1-2 mg  1-2 mg Intravenous Q2H PRN Erick Blinks, MD   2 mg at 10/29/15 1136  . insulin aspart (novoLOG) injection 0-15 Units  0-15 Units Subcutaneous TID WC [name], MD   3 Units at 10/28/15 1709  . methocarbamol (ROBAXIN) 500 mg in dextrose 5 % 50 mL IVPB  500 mg Intravenous Q6H PRN Erick Blinks, MD   500 mg at 10/25/15 0056  . multivitamin with minerals tablet 1 tablet  1 tablet Oral Daily Erick Blinks, MD   1 tablet at 10/29/15 0839  . nitroGLYCERIN (NITROSTAT) SL tablet 0.4 mg  0.4 mg Sublingual Q5 min PRN Erick Blinks, MD      . ondansetron (ZOFRAN) tablet 4 mg  4 mg Oral Q6H PRN Erick Blinks, MD       Or  . ondansetron (ZOFRAN) injection 4 mg  4 mg  Intravenous Q6H PRN Erick Blinks, MD   4 mg at 10/25/15 1706  . oxyCODONE-acetaminophen (PERCOCET/ROXICET) 5-325 MG per tablet 1-2 tablet  1-2 tablet Oral Q4H PRN Erick Blinks, MD   2 tablet at 10/28/15 0155  . polyethylene glycol (MIRALAX / GLYCOLAX) packet 17 g  17 g Oral Daily Erick Blinks, MD   17 g at 10/28/15 1027  . predniSONE (DELTASONE) tablet 60 mg  60 mg Oral Q breakfast [name], MD   60 mg at 10/29/15 5409  . tamsulosin (FLOMAX) capsule 0.4 mg  0.4 mg Oral QPC breakfast Erick Blinks, MD   0.4 mg at 10/29/15 8119     Discharge Medications: Please see discharge summary for a list of discharge medications.  Relevant Imaging Results:  Relevant Lab Results:  Additional Information    Raye Sorrow, LCSW

## 2015-10-30 LAB — GLUCOSE, CAPILLARY: Glucose-Capillary: 169 mg/dL — ABNORMAL HIGH (ref 65–99)

## 2015-11-03 NOTE — Discharge Summary (Signed)
Jake Coca:  [name], [name]             ACCOUNT NO.:  [id number]  MEDICAL RECORD NO.:  [id number]  LOCATION:  S155                          FACILITY:  [facility]  PHYSICIAN:  Cordell Coke L. Juanetta GoslingHawkins, M.D.DATE OF BIRTH:  1941/02/01  DATE OF ADMISSION:  10/29/2015 DATE OF DISCHARGE:  04/03/2017LH                              DISCHARGE SUMMARY   ADDENDUM:  FINAL DISCHARGE DIAGNOSES:  Intractable pain due to combination of lumbar disk disease with radiculopathy and lumbar spinal stenosis.     Roizy Harold L. Juanetta GoslingHawkins, M.D.     ELH/MEDQ  D:  11/03/2015  T:  11/03/2015  Job:  [id number]

## 2015-11-04 DIAGNOSIS — M545 Low back pain: Secondary | ICD-10-CM | POA: Diagnosis not present

## 2015-11-04 DIAGNOSIS — N390 Urinary tract infection, site not specified: Secondary | ICD-10-CM | POA: Diagnosis not present

## 2015-11-05 NOTE — H&P (Signed)
Jake MarvelRonald W [name] MRN: [id number] DOB/AGE: 73/09/1940 73 y.o. Primary Care Physician:Tabrina Esty L, MD Admit date: 10/29/2015 Chief Complaint: Back pain leg pain HPI: This is documentation of the history and physical that I performed at the skilled care facility on 11/04/2015. He is a 75 year old who came to the hospital with intractable back pain. He has multiple other medical problems as listed below. He was found to have a new herniated disc. He's had multiple surgeries on his back. He was treated with IV steroids and pain medications and improved but he is still having a lot of trouble with pain and has trouble with weakness of his left leg. He is unable to stand unassisted.  Past Medical History  Diagnosis Date  . Hyperlipidemia   . Small bowel problem     HAD ALOT OF SCAR TISSUE FROM PREVIOUS SURGERIES..NG WAS INSERTED ...Marland Kitchen.Marland Kitchen.NO SURGERY NEEDED...Marland Kitchen.Marland Kitchen.IN FOR 8 DAYS  . Disorder of blood     BEEN TREATED BY DERMATOLOGIST X 4 YRS..."BLOOD BLISTERS"  . Arthritis   . Gout   . Anxiety   . Hx MRSA infection     rt shoulder  . Pneumonia     "I've had it 3-4 times"  . Coronary artery disease     IN 2000   STENT PLACED IN 2012 sees Dr. Dietrich Patesothbart, saw last 2013  . HTN (hypertension)     [place] Dr. [place]etta [place]  . Small bowel obstruction (HCC)   . Ventral hernia   . Acute renal failure (HCC) 04/17/2013  . 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 Anemia, iron deficiency 09/09/2013   Past Surgical History  Procedure Laterality Date  . Sternal surg  2000    HAS HAD 5-6 ON HIS STERNUM; "caught MRSA in it"  . Lumbar laminectomy/decompression microdiscectomy  06/13/2011    Procedure: LUMBAR LAMINECTOMY/DECOMPRESSION MICRODISCECTOMY;  Surgeon: Karn CassisErnesto M Botero;  Location: [facility];  Service: Neurosurgery;  Laterality: N/A;  Lumbar three, lumbar four-five Laminectomy  . Eye surgery      bilateral cataract  . Anterior cervical corpectomy  12/17/11  . Incision and  drainage of wound  ~ 20ll; 12/03/11    "had infection in my right"  . Shoulder open rotator cuff repair  ~ 2011    right  . Peripherally inserted central catheter insertion  2011 & 11/2011  . Cataract extraction w/ intraocular lens  implant, bilateral  ? 2011  . Coronary artery bypass graft  2000    CABG X5  . Back surgery      lumbar  . Tonsillectomy  1949  . Cholecystectomy  2006 "or after"  . Coronary angioplasty with stent placement  2012  . Anterior cervical corpectomy  12/17/2011    Procedure: ANTERIOR CERVICAL CORPECTOMY;  Surgeon: Karn CassisErnesto M Botero, MD;  Location: [facility];  Service: Neurosurgery;  Laterality: N/A;  Anterior Cervical Decompression Fusion Five to Thoracic Two with plating  . Lumbar laminectomy/decompression microdiscectomy Right 06/17/2013    Procedure: LUMBAR LAMINECTOMY/DECOMPRESSION MICRODISCECTOMY 1 LEVEL;  Surgeon: Karn CassisErnesto M Botero, MD;  Location: [facility];  Service: Neurosurgery;  Laterality: Right;  Right L3-4 Microdiskectomy  . Lumbar wound debridement N/A 08/02/2013    Procedure: INCISION AND DRAINAGE OF LUMBAR WOUND DEBRIDEMENT;  Surgeon: Karn CassisErnesto M Botero, MD;  Location: [facility];  Service: Neurosurgery;  Laterality: N/A;  . Shoulder arthroscopy Left 09/13/2013    Procedure: ARTHROSCOPIC IRRIGATION AND DEBRIDEMENT, SYNOVECTOMY ,  LEFT SHOULDER ;  Surgeon:  Thera Flake., MD;  Location: [facility];  Service: Orthopedics;  Laterality: Left;        Family History  Problem Relation Age of Onset  . Heart disease    . Hypertension Mother   . Hypertension Maternal Aunt   . Hypertension Maternal Uncle   . Anesthesia problems Neg Hx   . Hypotension Neg Hx   . Malignant hyperthermia Neg Hx   . Pseudochol deficiency Neg Hx     Social History:  reports that he quit smoking about 16 years ago. His smoking use included Cigarettes. He started smoking about 55 years ago. He has a 1 pack-year smoking history. He has never used smokeless tobacco. He reports that  he does not drink alcohol or use illicit drugs.   Allergies:  Allergies  Allergen Reactions  . Morphine Other (See Comments)    REACTION: made him go crazy; "I had fun w/it; my family didn't think it was too funny"  . Metoprolol Other (See Comments)    REACTION:  Tachycardia; "don't remember how bad it was; it was so long ago"  . Rifampin     Itch     Medications Prior to Admission  Medication Sig Dispense Refill  . acetaminophen (TYLENOL) 325 MG tablet Take 1-2 tablets (325-650 mg total) by mouth every 4 (four) hours as needed for mild pain.    Marland Kitchen aspirin EC 81 MG tablet Take 81 mg by mouth daily.    Marland Kitchen atorvastatin (LIPITOR) 40 MG tablet TAKE 1 TABLET BY MOUTH EVERY DAY 30 tablet 6  . carvedilol (COREG) 25 MG tablet Take 1 tablet (25 mg total) by mouth 2 (two) times daily with a meal. 60 tablet 12  . cefUROXime (CEFTIN) 250 MG tablet Take 1 tablet (250 mg total) by mouth 2 (two) times daily with a meal. 14 tablet 0  . docusate sodium (COLACE) 100 MG capsule Take 100 mg by mouth daily.    Marland Kitchen doxazosin (CARDURA) 8 MG tablet Take 8 mg by mouth daily.    . finasteride (PROSCAR) 5 MG tablet Take 5 mg by mouth daily.    . Multiple Vitamin (MULTIVITAMIN) tablet Take 1 tablet by mouth daily.    . nitroGLYCERIN (NITROSTAT) 0.4 MG SL tablet Place 0.4 mg under the tongue every 5 (five) minutes as needed for chest pain. May take up to 3 doses per episode. Contact MD or 911 if unresolved chest pain continues.    Marland Kitchen oxyCODONE-acetaminophen (PERCOCET/ROXICET) 5-325 MG tablet Take 1 tablet by mouth 4 (four) times daily as needed. pain  0  . predniSONE (DELTASONE) 20 MG tablet Take 3 tablets (60 mg total) by mouth daily with breakfast. 100 tablet 0  . tamsulosin (FLOMAX) 0.4 MG CAPS capsule Take 1 capsule (0.4 mg total) by mouth daily after breakfast. 30 capsule 1       ZOX:WRUEA from the symptoms mentioned above,there are no other symptoms referable to all systems reviewed.  Physical Exam: There  were no vitals taken for this visit. He is awake and alert. He is in a wheelchair. His pupils are reactive nodes and throat are clear mucous membranes are moist his neck is supple without masses. His chest is clear. He has significant scarring of his anterior chest from previous cardiac surgeries. His heart is regular without gallop. His abdomen is soft without masses. He has wasting of the lower extremities bilaterally and has significant weakness of his left leg.   No results for input(s): WBC, NEUTROABS, HGB,  HCT, MCV, PLT in the last 72 hours. No results for input(s): NA, K, CL, CO2, GLUCOSE, BUN, CREATININE, CALCIUM, MG in the last 72 hours.  Invalid input(s): PHOlablast2(ast:2,ALT:2,alkphos:2,bilitot:2,prot:2,albumin:2)@    No results found for this or any previous visit (from the past 240 hour(s)).   Ct Lumbar Spine Wo Contrast  10/22/2015  CLINICAL DATA:  Acute onset of worsening lower back pain. Numbness, burning and pain along the left leg and foot. Initial encounter. EXAM: CT LUMBAR SPINE WITHOUT CONTRAST TECHNIQUE: Multidetector CT imaging of the lumbar spine was performed without intravenous contrast administration. Multiplanar CT image reconstructions were also generated. COMPARISON:  MRI of the lumbar spine performed 09/05/2013 FINDINGS: There is no evidence of fracture or subluxation along the lumbar spine. Vertebral bodies demonstrate normal height. The patient is status post lumbar spinal fusion at L2-S1, with underlying decompression. There is mild grade 1 retrolisthesis of L2 on L3, and disc space narrowing is noted at L4-L5 and L5-S1, with scattered anterior and lateral osteophytes. There is underlying bony foraminal narrowing bilaterally at L5-S1. Diffuse vascular calcifications are seen. The paraspinal musculature appears grossly intact. Vascular perinephric stranding is noted bilaterally. Scattered calcified granulomata are seen within the spleen. IMPRESSION: 1. No acute  abnormality seen to explain the patient's symptoms. 2. Status post lumbar spinal fusion at L2-S1, with underlying decompression. 3. Mild degenerative change along the lumbar spine. 4. Diffuse vascular calcifications seen. Electronically Signed   By: Roanna Raider M.D.   On: 10/22/2015 05:27   Mr Lumbar Spine Wo Contrast  10/22/2015  CLINICAL DATA:  Severe low back pain. Lumbar surgery x 3. The examination had to be discontinued prior to completion due to patient refusal for further imaging. EXAM: MRI LUMBAR SPINE WITHOUT CONTRAST TECHNIQUE: Multiplanar, multisequence MR imaging of the lumbar spine was performed. No intravenous contrast was administered. COMPARISON:  CT of the lumbar spine 10/22/2015. MRI of the lumbar spine 09/05/2013. FINDINGS: Study is moderately degraded by patient motion. Normal signal is present in the conus medullaris which terminates at L1, within normal limits. Pedicle screw and rod fixation is evident L2-S1. Bilateral pedicle screws are present with the exception of L4 on the right. Fusion at L4-5 is stable. There is some exposure of the disc spacer on the left, unchanged. The axial images are nondiagnostic. No significant interval change is a prominent inferior disc extrusion at L2-3. This extends 13 mm below the superior endplate of L3. Moderate central and bilateral foraminal stenosis results. Severe left and moderate right foraminal stenosis is again noted L3-4. Moderate left foraminal stenosis is again noted at L4-5 and L5-S1. The L1-2 level is unremarkable. IMPRESSION: 1. New inferior disc extrusion centrally at L2-3 resulting in moderate central and bilateral foraminal stenosis, new since prior exam. 2. Moderate to severe left foraminal stenosis at L3-4 and L4-5. Moderate left foraminal narrowing at L5-S1. 3. Posterior pedicle screw and rod fixation L2-S1. Electronically Signed   By: Marin Roberts M.D.   On: 10/22/2015 07:35   Impression: He has had cauda equina  syndrome. He had been improving but now has worsening problems after a new ruptured disc. He has leg weakness. He has multiple other medical problems including cardiac disease which is pretty stable hypertension which is doing okay. Active Problems:   * No active hospital problems. *     Plan: He will remain on high-dose oral steroids. He has pain medication ordered which is more effective now. He will continue with everything else. I'm going to discuss again  with his neurosurgeon in [place]      Kaytelyn Glore L   11/05/2015, 7:58 AM

## 2015-11-09 ENCOUNTER — Ambulatory Visit: Payer: Medicare Other | Admitting: Cardiovascular Disease

## 2015-11-09 ENCOUNTER — Encounter: Payer: Self-pay | Admitting: Cardiovascular Disease

## 2015-11-21 DIAGNOSIS — G8918 Other acute postprocedural pain: Secondary | ICD-10-CM | POA: Diagnosis not present

## 2015-11-21 DIAGNOSIS — J9601 Acute respiratory failure with hypoxia: Secondary | ICD-10-CM | POA: Diagnosis not present

## 2015-11-21 DIAGNOSIS — D638 Anemia in other chronic diseases classified elsewhere: Secondary | ICD-10-CM | POA: Diagnosis present

## 2015-11-21 DIAGNOSIS — Y33XXXD Other specified events, undetermined intent, subsequent encounter: Secondary | ICD-10-CM | POA: Diagnosis not present

## 2015-11-21 DIAGNOSIS — M4800 Spinal stenosis, site unspecified: Secondary | ICD-10-CM | POA: Diagnosis not present

## 2015-11-21 DIAGNOSIS — Z48811 Encounter for surgical aftercare following surgery on the nervous system: Secondary | ICD-10-CM | POA: Diagnosis not present

## 2015-11-21 DIAGNOSIS — S343XXD Injury of cauda equina, subsequent encounter: Secondary | ICD-10-CM | POA: Diagnosis not present

## 2015-11-21 DIAGNOSIS — S32029D Unspecified fracture of second lumbar vertebra, subsequent encounter for fracture with routine healing: Secondary | ICD-10-CM | POA: Diagnosis not present

## 2015-11-21 DIAGNOSIS — Y831 Surgical operation with implant of artificial internal device as the cause of abnormal reaction of the patient, or of later complication, without mention of misadventure at the time of the procedure: Secondary | ICD-10-CM | POA: Diagnosis not present

## 2015-11-21 DIAGNOSIS — Z981 Arthrodesis status: Secondary | ICD-10-CM | POA: Diagnosis not present

## 2015-11-21 DIAGNOSIS — F322 Major depressive disorder, single episode, severe without psychotic features: Secondary | ICD-10-CM | POA: Diagnosis not present

## 2015-11-21 DIAGNOSIS — Z7409 Other reduced mobility: Secondary | ICD-10-CM | POA: Diagnosis not present

## 2015-11-21 DIAGNOSIS — Z7982 Long term (current) use of aspirin: Secondary | ICD-10-CM | POA: Diagnosis not present

## 2015-11-21 DIAGNOSIS — Z789 Other specified health status: Secondary | ICD-10-CM | POA: Diagnosis not present

## 2015-11-21 DIAGNOSIS — R32 Unspecified urinary incontinence: Secondary | ICD-10-CM | POA: Diagnosis not present

## 2015-11-21 DIAGNOSIS — K592 Neurogenic bowel, not elsewhere classified: Secondary | ICD-10-CM | POA: Diagnosis not present

## 2015-11-21 DIAGNOSIS — Z862 Personal history of diseases of the blood and blood-forming organs and certain disorders involving the immune mechanism: Secondary | ICD-10-CM | POA: Diagnosis not present

## 2015-11-21 DIAGNOSIS — I2510 Atherosclerotic heart disease of native coronary artery without angina pectoris: Secondary | ICD-10-CM | POA: Diagnosis not present

## 2015-11-21 DIAGNOSIS — F5102 Adjustment insomnia: Secondary | ICD-10-CM | POA: Diagnosis not present

## 2015-11-21 DIAGNOSIS — E785 Hyperlipidemia, unspecified: Secondary | ICD-10-CM | POA: Diagnosis not present

## 2015-11-21 DIAGNOSIS — M5136 Other intervertebral disc degeneration, lumbar region: Secondary | ICD-10-CM | POA: Diagnosis not present

## 2015-11-21 DIAGNOSIS — I10 Essential (primary) hypertension: Secondary | ICD-10-CM | POA: Diagnosis not present

## 2015-11-21 DIAGNOSIS — S32029S Unspecified fracture of second lumbar vertebra, sequela: Secondary | ICD-10-CM | POA: Diagnosis not present

## 2015-11-21 DIAGNOSIS — K219 Gastro-esophageal reflux disease without esophagitis: Secondary | ICD-10-CM | POA: Diagnosis not present

## 2015-11-21 DIAGNOSIS — N400 Enlarged prostate without lower urinary tract symptoms: Secondary | ICD-10-CM | POA: Diagnosis present

## 2015-11-21 DIAGNOSIS — Z9089 Acquired absence of other organs: Secondary | ICD-10-CM | POA: Diagnosis not present

## 2015-11-21 DIAGNOSIS — T17990A Other foreign object in respiratory tract, part unspecified in causing asphyxiation, initial encounter: Secondary | ICD-10-CM | POA: Diagnosis not present

## 2015-11-21 DIAGNOSIS — S32029A Unspecified fracture of second lumbar vertebra, initial encounter for closed fracture: Secondary | ICD-10-CM | POA: Diagnosis not present

## 2015-11-21 DIAGNOSIS — D509 Iron deficiency anemia, unspecified: Secondary | ICD-10-CM | POA: Diagnosis not present

## 2015-11-21 DIAGNOSIS — J90 Pleural effusion, not elsewhere classified: Secondary | ICD-10-CM | POA: Diagnosis not present

## 2015-11-21 DIAGNOSIS — F4321 Adjustment disorder with depressed mood: Secondary | ICD-10-CM | POA: Diagnosis not present

## 2015-11-21 DIAGNOSIS — G8929 Other chronic pain: Secondary | ICD-10-CM | POA: Diagnosis present

## 2015-11-21 DIAGNOSIS — M109 Gout, unspecified: Secondary | ICD-10-CM | POA: Diagnosis present

## 2015-11-21 DIAGNOSIS — N390 Urinary tract infection, site not specified: Secondary | ICD-10-CM | POA: Diagnosis not present

## 2015-11-21 DIAGNOSIS — T84226A Displacement of internal fixation device of vertebrae, initial encounter: Secondary | ICD-10-CM | POA: Diagnosis present

## 2015-11-21 DIAGNOSIS — M5126 Other intervertebral disc displacement, lumbar region: Secondary | ICD-10-CM | POA: Diagnosis not present

## 2015-11-21 DIAGNOSIS — M6281 Muscle weakness (generalized): Secondary | ICD-10-CM | POA: Diagnosis not present

## 2015-11-21 DIAGNOSIS — R1312 Dysphagia, oropharyngeal phase: Secondary | ICD-10-CM | POA: Diagnosis not present

## 2015-11-21 DIAGNOSIS — M4316 Spondylolisthesis, lumbar region: Secondary | ICD-10-CM | POA: Diagnosis not present

## 2015-11-21 DIAGNOSIS — M1990 Unspecified osteoarthritis, unspecified site: Secondary | ICD-10-CM | POA: Diagnosis not present

## 2015-11-21 DIAGNOSIS — F329 Major depressive disorder, single episode, unspecified: Secondary | ICD-10-CM | POA: Diagnosis not present

## 2015-11-21 DIAGNOSIS — F419 Anxiety disorder, unspecified: Secondary | ICD-10-CM | POA: Diagnosis not present

## 2015-11-21 DIAGNOSIS — M549 Dorsalgia, unspecified: Secondary | ICD-10-CM | POA: Diagnosis not present

## 2015-11-21 DIAGNOSIS — Z951 Presence of aortocoronary bypass graft: Secondary | ICD-10-CM | POA: Diagnosis not present

## 2015-11-21 DIAGNOSIS — R262 Difficulty in walking, not elsewhere classified: Secondary | ICD-10-CM | POA: Diagnosis not present

## 2015-11-21 DIAGNOSIS — F4320 Adjustment disorder, unspecified: Secondary | ICD-10-CM | POA: Diagnosis not present

## 2015-11-21 DIAGNOSIS — M21372 Foot drop, left foot: Secondary | ICD-10-CM | POA: Diagnosis present

## 2015-11-21 DIAGNOSIS — M5416 Radiculopathy, lumbar region: Secondary | ICD-10-CM | POA: Diagnosis not present

## 2015-11-21 DIAGNOSIS — R278 Other lack of coordination: Secondary | ICD-10-CM | POA: Diagnosis not present

## 2015-11-21 DIAGNOSIS — R0902 Hypoxemia: Secondary | ICD-10-CM | POA: Diagnosis not present

## 2015-11-21 DIAGNOSIS — R918 Other nonspecific abnormal finding of lung field: Secondary | ICD-10-CM | POA: Diagnosis not present

## 2015-11-21 DIAGNOSIS — Z79899 Other long term (current) drug therapy: Secondary | ICD-10-CM | POA: Diagnosis not present

## 2015-11-21 DIAGNOSIS — M5116 Intervertebral disc disorders with radiculopathy, lumbar region: Secondary | ICD-10-CM | POA: Diagnosis not present

## 2015-11-21 DIAGNOSIS — T84218A Breakdown (mechanical) of internal fixation device of other bones, initial encounter: Secondary | ICD-10-CM | POA: Diagnosis not present

## 2015-11-21 DIAGNOSIS — G9741 Accidental puncture or laceration of dura during a procedure: Secondary | ICD-10-CM | POA: Diagnosis not present

## 2015-11-21 DIAGNOSIS — M4806 Spinal stenosis, lumbar region: Secondary | ICD-10-CM | POA: Diagnosis not present

## 2015-11-21 DIAGNOSIS — R29898 Other symptoms and signs involving the musculoskeletal system: Secondary | ICD-10-CM | POA: Diagnosis not present

## 2015-12-03 DIAGNOSIS — N390 Urinary tract infection, site not specified: Secondary | ICD-10-CM | POA: Diagnosis not present

## 2015-12-03 DIAGNOSIS — F329 Major depressive disorder, single episode, unspecified: Secondary | ICD-10-CM | POA: Diagnosis not present

## 2015-12-03 DIAGNOSIS — E785 Hyperlipidemia, unspecified: Secondary | ICD-10-CM | POA: Diagnosis not present

## 2015-12-03 DIAGNOSIS — R278 Other lack of coordination: Secondary | ICD-10-CM | POA: Diagnosis not present

## 2015-12-03 DIAGNOSIS — M4800 Spinal stenosis, site unspecified: Secondary | ICD-10-CM | POA: Diagnosis not present

## 2015-12-03 DIAGNOSIS — R262 Difficulty in walking, not elsewhere classified: Secondary | ICD-10-CM | POA: Diagnosis not present

## 2015-12-03 DIAGNOSIS — K592 Neurogenic bowel, not elsewhere classified: Secondary | ICD-10-CM | POA: Diagnosis not present

## 2015-12-03 DIAGNOSIS — M1990 Unspecified osteoarthritis, unspecified site: Secondary | ICD-10-CM | POA: Diagnosis not present

## 2015-12-03 DIAGNOSIS — I10 Essential (primary) hypertension: Secondary | ICD-10-CM | POA: Diagnosis not present

## 2015-12-03 DIAGNOSIS — R29898 Other symptoms and signs involving the musculoskeletal system: Secondary | ICD-10-CM | POA: Diagnosis not present

## 2015-12-03 DIAGNOSIS — G4700 Insomnia, unspecified: Secondary | ICD-10-CM | POA: Diagnosis not present

## 2015-12-03 DIAGNOSIS — Z981 Arthrodesis status: Secondary | ICD-10-CM | POA: Diagnosis not present

## 2015-12-03 DIAGNOSIS — M6281 Muscle weakness (generalized): Secondary | ICD-10-CM | POA: Diagnosis not present

## 2015-12-03 DIAGNOSIS — M4806 Spinal stenosis, lumbar region: Secondary | ICD-10-CM | POA: Diagnosis not present

## 2015-12-03 DIAGNOSIS — M545 Low back pain: Secondary | ICD-10-CM | POA: Diagnosis not present

## 2015-12-03 DIAGNOSIS — K219 Gastro-esophageal reflux disease without esophagitis: Secondary | ICD-10-CM | POA: Diagnosis not present

## 2015-12-03 DIAGNOSIS — L989 Disorder of the skin and subcutaneous tissue, unspecified: Secondary | ICD-10-CM | POA: Diagnosis not present

## 2015-12-03 DIAGNOSIS — R1312 Dysphagia, oropharyngeal phase: Secondary | ICD-10-CM | POA: Diagnosis not present

## 2015-12-03 DIAGNOSIS — I2510 Atherosclerotic heart disease of native coronary artery without angina pectoris: Secondary | ICD-10-CM | POA: Diagnosis not present

## 2015-12-03 DIAGNOSIS — S343XXD Injury of cauda equina, subsequent encounter: Secondary | ICD-10-CM | POA: Diagnosis not present

## 2015-12-03 DIAGNOSIS — D509 Iron deficiency anemia, unspecified: Secondary | ICD-10-CM | POA: Diagnosis not present

## 2015-12-03 DIAGNOSIS — M549 Dorsalgia, unspecified: Secondary | ICD-10-CM | POA: Diagnosis not present

## 2015-12-03 DIAGNOSIS — F29 Unspecified psychosis not due to a substance or known physiological condition: Secondary | ICD-10-CM | POA: Diagnosis not present

## 2015-12-03 DIAGNOSIS — Z7409 Other reduced mobility: Secondary | ICD-10-CM | POA: Diagnosis not present

## 2015-12-03 DIAGNOSIS — Z48811 Encounter for surgical aftercare following surgery on the nervous system: Secondary | ICD-10-CM | POA: Diagnosis not present

## 2015-12-03 DIAGNOSIS — S32029D Unspecified fracture of second lumbar vertebra, subsequent encounter for fracture with routine healing: Secondary | ICD-10-CM | POA: Diagnosis not present

## 2015-12-16 DIAGNOSIS — M545 Low back pain: Secondary | ICD-10-CM | POA: Diagnosis not present

## 2015-12-16 DIAGNOSIS — N390 Urinary tract infection, site not specified: Secondary | ICD-10-CM | POA: Diagnosis not present

## 2015-12-17 NOTE — H&P (Signed)
[name]:  Dubuc, Nellie             ACCOUNT NO.:  [id number]  MEDICAL RECORD NO.:  [id number]  LOCATION:  S155                          FACILITY:  [facility]  PHYSICIAN:  Debora Stockdale L. Juanetta GoslingHawkins, M.D.DATE OF BIRTH:  11-11-1940  DATE OF ADMISSION:  10/29/2015 DATE OF DISCHARGE:  04/26/2017LH                             HISTORY & PHYSICAL   The patient is at the [facility].  HISTORY OF PRESENT ILLNESS:  [name] is a 75 year old, who had been at the nursing center, but had increasing problems with pain in his back.  He eventually went back to his neurosurgeon at [facility] and had extensive back surgery.  He has come back for rehabilitation.  He had complications after his surgery, and had what appeared to be some cardiac palpitations as well as anemia.  He is doing better now.  He still has weakness of his left more than his right leg, but the pain is better.  He has a previous history of cauda equina syndrome and that does seem to be improving.  PAST MEDICAL HISTORY:  Otherwise is positive for coronary artery occlusive disease.  He has had bypass grafting and had complication of mediastinitis from that.  He has hypertension which is pretty well controlled.  He has had multiple orthopedic procedures.  He has a chronic rash and has been on chronic prednisone.  SOCIAL HISTORY:  He is a nonsmoker.  He has been at the nursing facility for about 2 months.  He is married and normally lives at home with his wife.  FAMILY HISTORY:  Noncontributory to current illness.  REVIEW OF SYSTEMS:  Except as mentioned, he feels like he is doing a little bit better.  He has chronic neurogenic bladder and does self catheterization.  He is not having any chest pain.  No abdominal pain. His pain in his leg is much improved.  PHYSICAL EXAMINATION:  GENERAL:  He is in a wheelchair. HEENT:  His pupils are reactive.  Nose and throat are clear.  Mucous membranes are moist. NECK:  Supple. CHEST:   Clear. HEART:  Regular without gallop. ABDOMEN:  Soft.  No masses are felt. EXTREMITIES:  No edema, but he has wasting of both lower extremities.  ASSESSMENT:  He has had extensive back surgery.  I forgot to mention that he had a very long surgical scar on his lumbar spine area.  This is healing well.  No signs of infection.  PLAN:  Continue with his rehabilitation and hopefully home fairly soon.     Taraoluwa Thakur L. Juanetta GoslingHawkins, M.D.     ELH/MEDQ  D:  12/16/2015  T:  12/16/2015  Job:  [id number]

## 2015-12-22 ENCOUNTER — Encounter (HOSPITAL_COMMUNITY)
Admission: RE | Admit: 2015-12-22 | Discharge: 2015-12-22 | Disposition: A | Discharge status: Home / Self Care | Payer: Medicare Other | Source: Skilled Nursing Facility | Attending: Pulmonary Disease | Admitting: Pulmonary Disease

## 2015-12-22 DIAGNOSIS — Z48811 Encounter for surgical aftercare following surgery on the nervous system: Secondary | ICD-10-CM | POA: Insufficient documentation

## 2015-12-23 ENCOUNTER — Encounter (HOSPITAL_COMMUNITY)
Admission: RE | Admit: 2015-12-23 | Discharge: 2015-12-23 | Disposition: A | Discharge status: Home / Self Care | Payer: Medicare Other | Source: Skilled Nursing Facility | Attending: Pulmonary Disease | Admitting: Pulmonary Disease

## 2015-12-26 DIAGNOSIS — F329 Major depressive disorder, single episode, unspecified: Secondary | ICD-10-CM | POA: Diagnosis not present

## 2015-12-26 DIAGNOSIS — G4700 Insomnia, unspecified: Secondary | ICD-10-CM | POA: Diagnosis not present

## 2015-12-26 DIAGNOSIS — F29 Unspecified psychosis not due to a substance or known physiological condition: Secondary | ICD-10-CM | POA: Diagnosis not present

## 2015-12-26 LAB — AEROBIC CULTURE  (SUPERFICIAL SPECIMEN)

## 2015-12-26 LAB — AEROBIC CULTURE W GRAM STAIN (SUPERFICIAL SPECIMEN): Gram Stain: NONE SEEN

## 2016-01-03 DIAGNOSIS — N390 Urinary tract infection, site not specified: Secondary | ICD-10-CM | POA: Diagnosis not present

## 2016-01-03 DIAGNOSIS — M545 Low back pain: Secondary | ICD-10-CM | POA: Diagnosis not present

## 2016-01-04 DIAGNOSIS — Z981 Arthrodesis status: Secondary | ICD-10-CM | POA: Diagnosis not present

## 2016-01-04 DIAGNOSIS — M4806 Spinal stenosis, lumbar region: Secondary | ICD-10-CM | POA: Diagnosis not present

## 2016-01-07 NOTE — Progress Notes (Signed)
[name]:  Dorn, Deckard             ACCOUNT NO.:  [id number]  MEDICAL RECORD NO.:  [id number]  LOCATION:                                 FACILITY:  PHYSICIAN:  Irlanda Croghan L. Juanetta GoslingHawkins, M.D.DATE OF BIRTH:  1941-05-28  DATE OF PROCEDURE: DATE OF DISCHARGE:                                PROGRESS NOTE   SUBJECTIVE:  The patient at the skilled care facility.  This is documentation my visit of January 03, 2016.  [name] is a 75 year old who has had multiple problems with his back.  He had recent back surgeries with significant multilevel degenerative disc disease.  He had what appeared to be some inflammation around his wound site.  This did culture out MRSA.  It is looking much better.  He has an appointment with his neurosurgeon tomorrow.  OBJECTIVE:  GENERAL:  He is awake and alert.  He still has weakness of his lower extremities and he has some problems now with flexion of his left knee.  He has a long surgical scar on his back with some superficial inflammation. CHEST:  Clear. HEART:  Regular.  ASSESSMENT:  He has what I think is mostly skin infection, but he is going to see his neurosurgeon tomorrow for further evaluation.  He has finished the antibiotics.  The wound looks cleaner.  PLAN:  Continue with current treatments and follow.     Ayari Liwanag L. Juanetta GoslingHawkins, M.D.   ______________________________ Oneal DeputyEdward L. Juanetta GoslingHawkins, M.D.    ELH/MEDQ  D:  01/04/2016  T:  01/05/2016  Job:  [id number]

## 2016-01-08 DIAGNOSIS — G4700 Insomnia, unspecified: Secondary | ICD-10-CM | POA: Diagnosis not present

## 2016-01-08 DIAGNOSIS — F329 Major depressive disorder, single episode, unspecified: Secondary | ICD-10-CM | POA: Diagnosis not present

## 2016-01-08 DIAGNOSIS — F29 Unspecified psychosis not due to a substance or known physiological condition: Secondary | ICD-10-CM | POA: Diagnosis not present

## 2016-01-15 DIAGNOSIS — L989 Disorder of the skin and subcutaneous tissue, unspecified: Secondary | ICD-10-CM | POA: Diagnosis not present

## 2016-01-21 NOTE — Progress Notes (Signed)
[name]:  [name], [name]             ACCOUNT NO.:  [id number]  MEDICAL RECORD NO.:  [id number]  LOCATION:                                 FACILITY:  PHYSICIAN:  Briston Lax L. Juanetta GoslingHawkins, M.D.DATE OF BIRTH:  1940/12/06  DATE OF PROCEDURE:  01/15/2016 DATE OF DISCHARGE:                                PROGRESS NOTE   SUBJECTIVE:  The patient is at the skilled care facility.  This is documentation of my visit of January 15, 2016 at the skilled care facility. I am unable to dictate that note into __________.  I was asked to see [name] because he has a skin lesion on his left leg.  His left leg is the one that has been so weak from his back problems.  He is hopeful of going home soon.  He has no other new complaints.  PHYSICAL EXAMINATION:  GENERAL:  He is in his wheelchair, out moving around in the facility.  He has what appears to be a sunburn on his left leg with an area that I think has blistered and opened up.  There is not any surrounding cellulitis. CHEST:  Clear. HEART:  Regular.  He looks pretty comfortable.  ASSESSMENT:  I do not think he needs anything else and my plan is to continue with his current treatments, hopefully discharge soon.     Shakima Nisley L. Juanetta GoslingHawkins, M.D.   ______________________________ Oneal DeputyEdward L. Juanetta GoslingHawkins, M.D.    ELH/MEDQ  D:  01/19/2016  T:  01/19/2016  Job:  [id number]

## 2016-02-05 DIAGNOSIS — G834 Cauda equina syndrome: Secondary | ICD-10-CM | POA: Diagnosis not present

## 2016-02-05 DIAGNOSIS — I10 Essential (primary) hypertension: Secondary | ICD-10-CM | POA: Diagnosis not present

## 2016-02-05 DIAGNOSIS — K219 Gastro-esophageal reflux disease without esophagitis: Secondary | ICD-10-CM | POA: Diagnosis not present

## 2016-02-05 DIAGNOSIS — M545 Low back pain: Secondary | ICD-10-CM | POA: Diagnosis not present

## 2016-02-06 ENCOUNTER — Ambulatory Visit (HOSPITAL_COMMUNITY): Payer: Medicare Other | Attending: Pulmonary Disease | Admitting: Physical Therapy

## 2016-02-06 DIAGNOSIS — R262 Difficulty in walking, not elsewhere classified: Secondary | ICD-10-CM | POA: Insufficient documentation

## 2016-02-06 DIAGNOSIS — M6281 Muscle weakness (generalized): Secondary | ICD-10-CM | POA: Diagnosis not present

## 2016-02-06 DIAGNOSIS — R29898 Other symptoms and signs involving the musculoskeletal system: Secondary | ICD-10-CM | POA: Insufficient documentation

## 2016-02-06 DIAGNOSIS — R2681 Unsteadiness on feet: Secondary | ICD-10-CM | POA: Insufficient documentation

## 2016-02-06 NOTE — Therapy (Signed)
[facility] [address] [place], [place], [address] Phone: [phone]   Fax:  [phone]  Physical Therapy Evaluation  Patient Details  Name: [name] MRN: [id number] Date of Birth: 03-31-1941 Referring Provider: [name]   Encounter Date: 02/06/2016      PT End of Session - 02/06/16 1054    Visit Number 1   Number of Visits 12   Date for PT Re-Evaluation 02/27/16   Authorization Type Medicare/[employer]    Authorization Time Period 02/06/16 to 03/19/16   Authorization - Visit Number 1   Authorization - Number of Visits 10   PT Start Time 0817   PT Stop Time 0857   PT Time Calculation (min) 40 min   Activity Tolerance Patient tolerated treatment well   Behavior During Therapy [facility] for tasks assessed/performed      Past Medical History  Diagnosis Date  . Hyperlipidemia   . Small bowel problem     HAD ALOT OF SCAR TISSUE FROM PREVIOUS SURGERIES..NG WAS INSERTED ...Marland KitchenMarland KitchenNO SURGERY NEEDED...Marland KitchenMarland KitchenIN FOR 8 DAYS  . Disorder of blood     BEEN TREATED BY DERMATOLOGIST X 4 YRS..."BLOOD BLISTERS"  . Arthritis   . Gout   . Anxiety   . Hx MRSA infection     rt shoulder  . Pneumonia     "I've had it 3-4 times"  . Coronary artery disease     IN 2000   STENT PLACED IN 2012 sees Dr. Dietrich Pates, saw last 2013  . HTN (hypertension)     sees [name] in [place]  . Small bowel obstruction (HCC)   . Ventral hernia   . Acute renal failure (HCC) 04/17/2013  . 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 Anemia, iron deficiency 09/09/2013    Past Surgical History  Procedure Laterality Date  . Sternal surg  2000    HAS HAD 5-6 ON HIS STERNUM; "caught MRSA in it"  . Lumbar laminectomy/decompression microdiscectomy  06/13/2011    Procedure: LUMBAR LAMINECTOMY/DECOMPRESSION MICRODISCECTOMY;  Surgeon: Karn Cassis;  Location: [facility];  Service: Neurosurgery;  Laterality: N/A;  Lumbar three, lumbar  four-five Laminectomy  . Eye surgery      bilateral cataract  . Anterior cervical corpectomy  12/17/11  . Incision and drainage of wound  ~ 20ll; 12/03/11    "had infection in my right"  . Shoulder open rotator cuff repair  ~ 2011    right  . Peripherally inserted central catheter insertion  2011 & 11/2011  . Cataract extraction w/ intraocular lens  implant, bilateral  ? 2011  . Coronary artery bypass graft  2000    CABG X5  . Back surgery      lumbar  . Tonsillectomy  1949  . Cholecystectomy  2006 "or after"  . Coronary angioplasty with stent placement  2012  . Anterior cervical corpectomy  12/17/2011    Procedure: ANTERIOR CERVICAL CORPECTOMY;  Surgeon: Karn Cassis, MD;  Location: [facility];  Service: Neurosurgery;  Laterality: N/A;  Anterior Cervical Decompression Fusion Five to Thoracic Two with plating  . Lumbar laminectomy/decompression microdiscectomy Right 06/17/2013    Procedure: LUMBAR LAMINECTOMY/DECOMPRESSION MICRODISCECTOMY 1 LEVEL;  Surgeon: Karn Cassis, MD;  Location: [facility];  Service: Neurosurgery;  Laterality: Right;  Right L3-4 Microdiskectomy  . Lumbar wound debridement N/A 08/02/2013    Procedure: INCISION AND DRAINAGE OF LUMBAR WOUND DEBRIDEMENT;  Surgeon: Tanya Nones  Jeral Fruit, MD;  Location: [facility];  Service: Neurosurgery;  Laterality: N/A;  . Shoulder arthroscopy Left 09/13/2013    Procedure: ARTHROSCOPIC IRRIGATION AND DEBRIDEMENT, SYNOVECTOMY ,  LEFT SHOULDER ;  Surgeon: Thera Flake., MD;  Location: [facility] OR;  Service: Orthopedics;  Laterality: Left;    There were no vitals filed for this visit.       Subjective Assessment - 02/06/16 0821    Subjective Patient reports that one day his leg just gave way and he had a lot of pain, he had to have surgery done to correct this (unable to find surgical notes in time period patient is referring to to confirm, MD order says L2 fracture). He went to the [facility] center for PT after being DC-ed from hospital.  He now has a brace for his knee to support him during extension.He states that walking is still difficult for him to do, he does have trouble with his balance without his walker as well; no falls but plenty of close calls recenttly. He rates himself as a 10/100 on a subjetive scale.    Pertinent History beta blockers, CHF/CAD, HTN, history of lumbar laminectomy, CABG, history of ventral hernia, history of AVN B hips, history of cervical corpectomy, brace secondary to no sternum   How long can you sit comfortably? 90 minutes    How long can you stand comfortably? not sure    How long can you walk comfortably? not sure    Patient Stated Goals get leg working better, get walking    Currently in Pain? Yes   Pain Score 3    Pain Location Back   Pain Orientation Lower   Pain Descriptors / Indicators Sharp   Pain Type Chronic pain   Pain Radiating Towards down legs    Pain Onset More than a month ago   Pain Frequency Constant   Aggravating Factors  not sure    Pain Relieving Factors nothing    Effect of Pain on Daily Activities "just hurts"             [facility] PT Assessment - 02/06/16 0001    Assessment   Medical Diagnosis back fractuer of second lumbar vertebra    Referring Provider [name]    Onset Date/Surgical Date --  about 3 months ago    Next MD Visit [name] in 6 weeks    Precautions   Precaution Comments no lifting anything heavy; otherwise stay within pain limits per patient but MD may update precautions next week    Balance Screen   Has the patient fallen in the past 6 months --  can't remember    Has the patient had a decrease in activity level because of a fear of falling?  No   Is the patient reluctant to leave their home because of a fear of falling?  No   Prior Function   Level of Independence Independent;Independent with basic ADLs;Independent with gait;Independent with transfers   Vocation Retired   Leisure none, just get back to walking    Strength    Right Hip Flexion 4/5   Right Hip ABduction 4-/5  measured in sitting    Left Hip Flexion 2/5   Left Hip ABduction 3-/5  mesaured in sitting    Right Knee Flexion 4/5   Right Knee Extension 4+/5   Left Knee Flexion 1/5   Left Knee Extension 3/5   Right Ankle Dorsiflexion 4+/5   Left Ankle Dorsiflexion 2/5  Ambulation/Gait   Gait Comments use of KAFO throughout gait as patient''s L LE buckles without; reduced heel-toe pattern and gait speed, flexed at hips, excessive use of UEs on walker    6 minute walk test results    Aerobic Endurance Distance Walked 216   Endurance additional comments 3MWT, rollator, KAFO    High Level Balance   High Level Balance Comments TUG 36.6 with KAFO and rollator                            PT Education - 02/06/16 1053    Education provided Yes   Education Details prognosis, POC, HEP to be given next session    Person(s) Educated Patient   Methods Explanation   Comprehension Verbalized understanding          PT Short Term Goals - 02/06/16 1057    PT SHORT TERM GOAL #1   Title Patient to be able to ambulate at least 42250ft with rollator and KAFO in order to demonstrate improvement in mobilty    Time 3   Period Weeks   Status New   PT SHORT TERM GOAL #2   Title Patient to be able to tolerate CKC exercise with KAFO unlocked and minimal unsteadiness/instability in order to demonstrate improvement in condition    Time 3   Period Weeks   Status New   PT SHORT TERM GOAL #3   Title Patient to be able to statically stand with KAFO locked and no UE support in order to demonstrate improved balance    Time 3   Period Weeks   Status New   PT SHORT TERM GOAL #4   Title Patient will  consistetly perform appropriate HEP correctly, to be updated PRN    Time 3   Period Weeks   Status New           PT Long Term Goals - 02/06/16 1100    PT LONG TERM GOAL #1   Title Patient will demonstrate L LE strength at least 4-/5 and 5/5  muslce strength in all other tested groups in order to assist in improving gait and stablity    Time 6   Period Weeks   Status New   PT LONG TERM GOAL #2   Title Patient to be able to ambulate and perform functional mobility with AFO and rollator rather than KAFO in order to demonstrate improved strength and mobiltiy    Time 6   Period Weeks   Status New   PT LONG TERM GOAL #3   Title Patient to be able to complete TUG balance test in 18 seconds or less wtih rollator in order to demonstrate improved mobility and reduced fall risk    Time 6   Period Weeks   Status New   PT LONG TERM GOAL #4   Title Patient to be able to ambulate at least 61950ft with  rollator during 3MWT in order to demonstrate improved overall mobilty    Time 6   Period Weeks   Status New               Plan - 02/06/16 1055    Clinical Impression Statement Patient arrives stating that his main concern is difficulty walking, as his L LE is quite weak and he now has to use KAFO for mobility as his L knee will buckle without it. Upon examination, patient demonstrates significant functional weakness especially throughout L LE, marked  unsteadiness, and reduced gait tolerance and speed. He states that as far as he is aware he cannot lift anything heavy and his MD has told him to just do things within his pain limitations. Patient frequently required cues and explanation to participate in activities and objective measures as he frequently stated, "I can't do that". At this point patient will benefit from a trail of skilled PT services in order to attempt to address functional limitations, reduce fall risk and unsteadiness, and optimize overall level of function.    Rehab Potential Good   PT Frequency 2x / week   PT Duration 6 weeks   PT Treatment/Interventions ADLs/Self Care Home Management;Electrical Stimulation;Gait training;Stair training;Functional mobility training;Therapeutic activities;Therapeutic exercise;Balance  training;Neuromuscular re-education;Patient/family education;Manual techniques;Energy conservation;Taping   PT Next Visit Plan ASSIGN HEP; functional strengthening, balance and gait training.    PT Home Exercise Plan give 2nd session    Consulted and Agree with Plan of Care Patient      Patient will benefit from skilled therapeutic intervention in order to improve the following deficits and impairments:  Abnormal gait, Decreased activity tolerance, Decreased strength, Decreased balance, Decreased mobility, Difficulty walking, Improper body mechanics, Decreased coordination, Impaired flexibility, Postural dysfunction  Visit Diagnosis: Difficulty in walking, not elsewhere classified - Plan: PT plan of care cert/re-cert  Muscle weakness (generalized) - Plan: PT plan of care cert/re-cert  Unsteadiness on feet - Plan: PT plan of care cert/re-cert      G-Codes - February 13, 2016 1104    Functional Assessment Tool Used Based on skilled clinical assessment of strength, gait, balance   Functional Limitation Mobility: Walking and moving around   Mobility: Walking and Moving Around Current Status (Z6109) At least 60 percent but less than 80 percent impaired, limited or restricted   Mobility: Walking and Moving Around Goal Status (U0454) At least 40 percent but less than 60 percent impaired, limited or restricted       Problem List Patient Active Problem List   Diagnosis Date Noted  . 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 Infection of urinary tract 10/26/2015  . Intractable back pain 10/22/2015  . Back pain 10/22/2015  . Muscle weakness (generalized) 12/05/2013  . Tight fascia 12/05/2013  . Decreased range of motion of right shoulder 12/05/2013  . Decreased range of motion of left shoulder 12/05/2013  . Bilateral leg weakness 11/21/2013  . Poor balance 11/21/2013  . Difficulty in walking(719.7) 11/21/2013  . Shoulder pain, left 09/12/2013  . Anemia, iron deficiency 09/09/2013  .  Dyspnea 09/07/2013  . UTI (urinary tract infection) 09/07/2013  . Acute CHF (congestive heart failure) (HCC) 09/07/2013  . Elevated troponin 09/07/2013  . Fever and chills 09/06/2013  . Altered mental status 09/05/2013  . Neurogenic bowel 08/22/2013  . 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 Gout flare 08/22/2013  . Postoperative wound infection 08/22/2013  . Lumbar degenerative disc disease 08/02/2013  . Post-operative pain 07/29/2013  . Osteonecrosis (HCC) 05/05/2013  . Small bowel obstruction (HCC) 04/17/2013  . Acute renal failure (HCC) 04/17/2013  . Medial meniscus, posterior horn derangement 02/17/2013  . Knee sprain and strain 02/17/2013  . Trigger point with neck pain 03/18/2012  . Rotator cuff tear arthropathy of right shoulder 09/08/2011  . CAD in native artery 01/09/2011  . Ankle fracture 12/25/2010  . Contusion of shoulder, left 12/25/2010  . PEMPHIGUS VULGARIS 07/02/2010  . MRSA 05/13/2010  . PRURITUS 05/13/2010  . SPINAL STENOSIS, LUMBAR 05/13/2010  . HLD (hyperlipidemia) 04/24/2010  .  GASTROESOPHAGEAL REFLUX DISEASE 04/24/2010  . VENTRAL HERNIA, INCISIONAL 04/24/2010  . DEGENERATIVE JOINT DISEASE 04/24/2010  . PYOGENIC ARTHRITIS, SHOULDER REGION 02/04/2010  . Essential hypertension 01/01/2010  . RUPTURE ROTATOR CUFF 02/19/2009    Nedra Hai PT, DPT [id number]  [facility] [address] [place], [place], [address] Phone: [phone]   Fax:  [phone]  Name: [name] MRN: [id number] Date of Birth: 30-Mar-1941

## 2016-02-11 ENCOUNTER — Ambulatory Visit (HOSPITAL_COMMUNITY): Payer: Medicare Other | Admitting: Physical Therapy

## 2016-02-11 DIAGNOSIS — R29898 Other symptoms and signs involving the musculoskeletal system: Secondary | ICD-10-CM | POA: Diagnosis not present

## 2016-02-11 DIAGNOSIS — R2681 Unsteadiness on feet: Secondary | ICD-10-CM

## 2016-02-11 DIAGNOSIS — R262 Difficulty in walking, not elsewhere classified: Secondary | ICD-10-CM | POA: Diagnosis not present

## 2016-02-11 DIAGNOSIS — M6281 Muscle weakness (generalized): Secondary | ICD-10-CM

## 2016-02-11 NOTE — Therapy (Signed)
[facility] [address] [place], [place], [address] Phone: [phone]   Fax:  [phone]  Physical Therapy Treatment  Patient Details  Name: [name] MRN: [id number] Date of Birth: 04-Aug-1940 Referring Provider: [name]   Encounter Date: 02/11/2016      PT End of Session - 02/11/16 1013    Visit Number 2   Number of Visits 12   Date for PT Re-Evaluation 02/27/16   Authorization Type Medicare/[employer]    Authorization Time Period 02/06/16 to 03/19/16   Authorization - Visit Number 2   Authorization - Number of Visits 10   PT Start Time 0817   PT Stop Time 0916   PT Time Calculation (min) 59 min   Activity Tolerance Patient tolerated treatment well   Behavior During Therapy [facility] for tasks assessed/performed      Past Medical History  Diagnosis Date  . Hyperlipidemia   . Small bowel problem     HAD ALOT OF SCAR TISSUE FROM PREVIOUS SURGERIES..NG WAS INSERTED ...Marland KitchenMarland KitchenNO SURGERY NEEDED...Marland KitchenMarland KitchenIN FOR 8 DAYS  . Disorder of blood     BEEN TREATED BY DERMATOLOGIST X 4 YRS..."BLOOD BLISTERS"  . Arthritis   . Gout   . Anxiety   . Hx MRSA infection     rt shoulder  . Pneumonia     "I've had it 3-4 times"  . Coronary artery disease     IN 2000   STENT PLACED IN 2012 sees Dr. Dietrich Pates, saw last 2013  . HTN (hypertension)     sees [name] in [place]  . Small bowel obstruction (HCC)   . Ventral hernia   . Acute renal failure (HCC) 04/17/2013  . 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 Anemia, iron deficiency 09/09/2013    Past Surgical History  Procedure Laterality Date  . Sternal surg  2000    HAS HAD 5-6 ON HIS STERNUM; "caught MRSA in it"  . Lumbar laminectomy/decompression microdiscectomy  06/13/2011    Procedure: LUMBAR LAMINECTOMY/DECOMPRESSION MICRODISCECTOMY;  Surgeon: Karn Cassis;  Location: [facility];  Service: Neurosurgery;  Laterality: N/A;  Lumbar three, lumbar  four-five Laminectomy  . Eye surgery      bilateral cataract  . Anterior cervical corpectomy  12/17/11  . Incision and drainage of wound  ~ 20ll; 12/03/11    "had infection in my right"  . Shoulder open rotator cuff repair  ~ 2011    right  . Peripherally inserted central catheter insertion  2011 & 11/2011  . Cataract extraction w/ intraocular lens  implant, bilateral  ? 2011  . Coronary artery bypass graft  2000    CABG X5  . Back surgery      lumbar  . Tonsillectomy  1949  . Cholecystectomy  2006 "or after"  . Coronary angioplasty with stent placement  2012  . Anterior cervical corpectomy  12/17/2011    Procedure: ANTERIOR CERVICAL CORPECTOMY;  Surgeon: Karn Cassis, MD;  Location: [facility];  Service: Neurosurgery;  Laterality: N/A;  Anterior Cervical Decompression Fusion Five to Thoracic Two with plating  . Lumbar laminectomy/decompression microdiscectomy Right 06/17/2013    Procedure: LUMBAR LAMINECTOMY/DECOMPRESSION MICRODISCECTOMY 1 LEVEL;  Surgeon: Karn Cassis, MD;  Location: [facility];  Service: Neurosurgery;  Laterality: Right;  Right L3-4 Microdiskectomy  . Lumbar wound debridement N/A 08/02/2013    Procedure: INCISION AND DRAINAGE OF LUMBAR WOUND DEBRIDEMENT;  Surgeon: Tanya Nones  Jeral Fruit, MD;  Location: [facility];  Service: Neurosurgery;  Laterality: N/A;  . Shoulder arthroscopy Left 09/13/2013    Procedure: ARTHROSCOPIC IRRIGATION AND DEBRIDEMENT, SYNOVECTOMY ,  LEFT SHOULDER ;  Surgeon: Thera Flake., MD;  Location: [facility] OR;  Service: Orthopedics;  Laterality: Left;    There were no vitals filed for this visit.      Subjective Assessment - 02/11/16 0827    Subjective Pt states he was doing better when his Lt knee gave way and that is why he is here.  He states he is unable to lock out his Lt leg this is his main problem as far as he is concerned.   Pertinent History beta blockers, CHF/CAD, HTN, history of lumbar laminectomy, CABG, history of ventral hernia,  history of AVN B hips, history of cervical corpectomy, brace secondary to no sternum   How long can you sit comfortably? 90 minutes    How long can you stand comfortably? not sure    How long can you walk comfortably? not sure    Patient Stated Goals get leg working better, get walking    Currently in Pain? No/denies   Pain Onset More than a month ago                         [facility] Adult PT Treatment/Exercise - 02/11/16 0001    Exercises   Exercises Knee/Hip   Knee/Hip Exercises: Seated   Long Arc Quad Left;10 reps   Marching Both;10 reps   Knee/Hip Exercises: Supine   Short Arc Quad Sets Limitations  for 20:00    Short Arc Quad Sets Limitations with heelslides    Bridges 10 reps   Other Supine Knee/Hip Exercises marching and clam x 10 each    Other Supine Knee/Hip Exercises hamset x 10    Knee/Hip Exercises: Sidelying   Hip ABduction 10 reps                PT Education - 02/11/16 1012    Education provided Yes   Education Details given HEP    Person(s) Educated Patient   Methods Explanation;Verbal cues;Handout   Comprehension Verbalized understanding;Returned demonstration          PT Short Term Goals - 02/11/16 1016    PT SHORT TERM GOAL #1   Title Patient to be able to ambulate at least 439ft with rollator and KAFO in order to demonstrate improvement in mobilty    Time 3   Period Weeks   Status On-going   PT SHORT TERM GOAL #2   Title Patient to be able to tolerate CKC exercise with KAFO unlocked and minimal unsteadiness/instability in order to demonstrate improvement in condition    Time 3   Period Weeks   Status On-going   PT SHORT TERM GOAL #3   Title Patient to be able to statically stand with KAFO locked and no UE support in order to demonstrate improved balance    Time 3   Period Weeks   Status On-going   PT SHORT TERM GOAL #4   Title Patient will  consistetly perform appropriate HEP correctly, to be updated PRN    Time 3    Period Weeks   Status On-going           PT Long Term Goals - 02/11/16 1016    PT LONG TERM GOAL #1   Title Patient will demonstrate L LE strength at least 4-/5 and  5/5 muslce strength in all other tested groups in order to assist in improving gait and stablity    Time 6   Period Weeks   Status On-going   PT LONG TERM GOAL #2   Title Patient to be able to ambulate and perform functional mobility with AFO and rollator rather than KAFO in order to demonstrate improved strength and mobiltiy    Time 6   Period Weeks   Status On-going   PT LONG TERM GOAL #3   Title Patient to be able to complete TUG balance test in 18 seconds or less wtih rollator in order to demonstrate improved mobility and reduced fall risk    Time 6   Period Weeks   Status On-going   PT LONG TERM GOAL #4   Title Patient to be able to ambulate at least 667ft with  rollator during in order to demonstrate improved overall mobilty    Time 6   Period Weeks   Status On-going               Plan - 02/11/16 1013    Clinical Impression Statement Therapist reviewed evaluation and goals with patient.  Pt given a  HEP.  Pt started on e-stim with co-contraction on quads for improved strengthening.     Rehab Potential Good   PT Frequency 2x / week   PT Duration 6 weeks   PT Treatment/Interventions ADLs/Self Care Home Management;Electrical Stimulation;Gait training;Stair training;Functional mobility training;Therapeutic activities;Therapeutic exercise;Balance training;Neuromuscular re-education;Patient/family education;Manual techniques;Energy conservation;Taping   PT Next Visit Plan  functional strengthening, balance and gait training.    PT Home Exercise Plan give 2nd session    Consulted and Agree with Plan of Care Patient      Patient will benefit from skilled therapeutic intervention in order to improve the following deficits and impairments:  Abnormal gait, Decreased activity tolerance, Decreased  strength, Decreased balance, Decreased mobility, Difficulty walking, Improper body mechanics, Decreased coordination, Impaired flexibility, Postural dysfunction  Visit Diagnosis: Difficulty in walking, not elsewhere classified  Muscle weakness (generalized)  Unsteadiness on feet     Problem List Patient Active Problem List   Diagnosis Date Noted  . 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 Infection of urinary tract 10/26/2015  . Intractable back pain 10/22/2015  . Back pain 10/22/2015  . Muscle weakness (generalized) 12/05/2013  . Tight fascia 12/05/2013  . Decreased range of motion of right shoulder 12/05/2013  . Decreased range of motion of left shoulder 12/05/2013  . Bilateral leg weakness 11/21/2013  . Poor balance 11/21/2013  . Difficulty in walking(719.7) 11/21/2013  . Shoulder pain, left 09/12/2013  . Anemia, iron deficiency 09/09/2013  . Dyspnea 09/07/2013  . UTI (urinary tract infection) 09/07/2013  . Acute CHF (congestive heart failure) (HCC) 09/07/2013  . Elevated troponin 09/07/2013  . Fever and chills 09/06/2013  . Altered mental status 09/05/2013  . Neurogenic bowel 08/22/2013  . 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 Gout flare 08/22/2013  . Postoperative wound infection 08/22/2013  . Lumbar degenerative disc disease 08/02/2013  . Post-operative pain 07/29/2013  . Osteonecrosis (HCC) 05/05/2013  . Small bowel obstruction (HCC) 04/17/2013  . Acute renal failure (HCC) 04/17/2013  . Medial meniscus, posterior horn derangement 02/17/2013  . Knee sprain and strain 02/17/2013  . Trigger point with neck pain 03/18/2012  . Rotator cuff tear arthropathy of right shoulder 09/08/2011  . CAD in native artery 01/09/2011  . Ankle fracture 12/25/2010  . Contusion  of shoulder, left 12/25/2010  . PEMPHIGUS VULGARIS 07/02/2010  . MRSA 05/13/2010  . PRURITUS 05/13/2010  . SPINAL STENOSIS, LUMBAR 05/13/2010  . HLD (hyperlipidemia)  04/24/2010  . GASTROESOPHAGEAL REFLUX DISEASE 04/24/2010  . VENTRAL HERNIA, INCISIONAL 04/24/2010  . DEGENERATIVE JOINT DISEASE 04/24/2010  . PYOGENIC ARTHRITIS, SHOULDER REGION 02/04/2010  . Essential hypertension 01/01/2010  . RUPTURE ROTATOR CUFF 02/19/2009    Virgina Organynthia Athea Haley, PT CLT [phone] 02/11/2016, 10:17 AM  [facility] [address] [place], [place], [address] Phone: [phone]   Fax:  [phone]  Name: [name] MRN: [id number] Date of Birth: 10/01/1940

## 2016-02-11 NOTE — Patient Instructions (Addendum)
Knee Extension (Sitting)    Place _0___ pound weight on left ankle and straighten knee fully, lower slowly. Repeat _10___ times per set. Do __1__ sets per session. Do __3-4__ sessions per day.  http://orth.exer.us/732   Copyright  VHI. All rights reserved.  Bent Leg Lift (Hook-Lying)    Tighten stomach and slowly raise right leg _2___ inches from floor. Keep trunk rigid. Hold _3___ seconds. Repeat _10___ times per set. Do __1__ sets per session. Do _3___ sessions per day.  http://orth.exer.us/1090   Copyright  VHI. All rights reserved.  Strengthening: Hip Abduction (Side-Lying)   HOLD FOR NOW Tighten muscles on front of left thigh, then lift leg ____ inches from surface, keeping knee locked.  Repeat ____ times per set. Do ____ sets per session. Do ____ sessions per day.  http://orth.exer.us/622   Copyright  VHI. All rights reserved.  Isometric Gluteals    Tighten buttock muscles. Repeat _10___ times per set. Do _1___ sets per session. Do __4__ sessions per day.  http://orth.exer.us/1126   Copyright  VHI. All rights reserved.  Strengthening: Hamstring Set    With left foot bent , tighten muscles on back of thigh by pulling heel down into surface. Hold __5__ seconds. Repeat _10___ times per set. Do ___1_ sets per session. Do _2___ sessions per day.  http://orth.exer.us/604   Copyright  VHI. All rights reserved.  Strengthening: Quadriceps Set    Tighten muscles on top of thighs by pushing knees down into surface. Hold __5__ seconds. Repeat __10__ times per set. Do _1___ sets per session. Do ___2 sessions per day.  http://orth.exer.us/602   Copyright  VHI. All rights reserved.  Hip Abduction / Adduction: with Knee Flexion (Supine)    With left  knee bent, gently lower knee to side and return. Repeat __10__ times per set. Do __1__ sets per session. Do _3___ sessions per day.  http://orth.exer.us/682   Copyright  VHI. All rights reserved.

## 2016-02-13 ENCOUNTER — Encounter (INDEPENDENT_AMBULATORY_CARE_PROVIDER_SITE_OTHER): Payer: Self-pay | Admitting: Internal Medicine

## 2016-02-13 ENCOUNTER — Ambulatory Visit (HOSPITAL_COMMUNITY): Payer: Medicare Other

## 2016-02-13 DIAGNOSIS — R29898 Other symptoms and signs involving the musculoskeletal system: Secondary | ICD-10-CM | POA: Diagnosis not present

## 2016-02-13 DIAGNOSIS — R262 Difficulty in walking, not elsewhere classified: Secondary | ICD-10-CM

## 2016-02-13 DIAGNOSIS — M6281 Muscle weakness (generalized): Secondary | ICD-10-CM | POA: Diagnosis not present

## 2016-02-13 DIAGNOSIS — R2681 Unsteadiness on feet: Secondary | ICD-10-CM

## 2016-02-13 NOTE — Therapy (Signed)
[facility] [address] [place], [place], [address] Phone: [phone]   Fax:  [phone]  Physical Therapy Treatment  Patient Details  Name: [name] MRN: [id number] Date of Birth: Nov 12, 1940 Referring Provider: [name]   Encounter Date: 02/13/2016      PT End of Session - 02/13/16 1151    Visit Number 3   Number of Visits 12   Date for PT Re-Evaluation 02/27/16   Authorization Type Medicare/[employer]    Authorization Time Period 02/06/16 to 03/19/16   Authorization - Visit Number 3   Authorization - Number of Visits 10   PT Start Time 1129   PT Stop Time 1205   PT Time Calculation (min) 36 min   Activity Tolerance Patient tolerated treatment well   Behavior During Therapy [facility] for tasks assessed/performed      Past Medical History  Diagnosis Date  . Hyperlipidemia   . Small bowel problem     HAD ALOT OF SCAR TISSUE FROM PREVIOUS SURGERIES..NG WAS INSERTED ...Marland KitchenMarland KitchenNO SURGERY NEEDED...Marland KitchenMarland KitchenIN FOR 8 DAYS  . Disorder of blood     BEEN TREATED BY DERMATOLOGIST X 4 YRS..."BLOOD BLISTERS"  . Arthritis   . Gout   . Anxiety   . Hx MRSA infection     rt shoulder  . Pneumonia     "I've had it 3-4 times"  . Coronary artery disease     IN 2000   STENT PLACED IN 2012 sees Dr. Dietrich Pates, saw last 2013  . HTN (hypertension)     sees [name] in [place]  . Small bowel obstruction (HCC)   . Ventral hernia   . Acute renal failure (HCC) 04/17/2013  . 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 Anemia, iron deficiency 09/09/2013    Past Surgical History  Procedure Laterality Date  . Sternal surg  2000    HAS HAD 5-6 ON HIS STERNUM; "caught MRSA in it"  . Lumbar laminectomy/decompression microdiscectomy  06/13/2011    Procedure: LUMBAR LAMINECTOMY/DECOMPRESSION MICRODISCECTOMY;  Surgeon: Karn Cassis;  Location: [facility];  Service: Neurosurgery;  Laterality: N/A;  Lumbar three, lumbar  four-five Laminectomy  . Eye surgery      bilateral cataract  . Anterior cervical corpectomy  12/17/11  . Incision and drainage of wound  ~ 20ll; 12/03/11    "had infection in my right"  . Shoulder open rotator cuff repair  ~ 2011    right  . Peripherally inserted central catheter insertion  2011 & 11/2011  . Cataract extraction w/ intraocular lens  implant, bilateral  ? 2011  . Coronary artery bypass graft  2000    CABG X5  . Back surgery      lumbar  . Tonsillectomy  1949  . Cholecystectomy  2006 "or after"  . Coronary angioplasty with stent placement  2012  . Anterior cervical corpectomy  12/17/2011    Procedure: ANTERIOR CERVICAL CORPECTOMY;  Surgeon: Karn Cassis, MD;  Location: [facility];  Service: Neurosurgery;  Laterality: N/A;  Anterior Cervical Decompression Fusion Five to Thoracic Two with plating  . Lumbar laminectomy/decompression microdiscectomy Right 06/17/2013    Procedure: LUMBAR LAMINECTOMY/DECOMPRESSION MICRODISCECTOMY 1 LEVEL;  Surgeon: Karn Cassis, MD;  Location: [facility];  Service: Neurosurgery;  Laterality: Right;  Right L3-4 Microdiskectomy  . Lumbar wound debridement N/A 08/02/2013    Procedure: INCISION AND DRAINAGE OF LUMBAR WOUND DEBRIDEMENT;  Surgeon: Tanya Nones  Jeral FruitBotero, MD;  Location: [facility];  Service: Neurosurgery;  Laterality: N/A;  . Shoulder arthroscopy Left 09/13/2013    Procedure: ARTHROSCOPIC IRRIGATION AND DEBRIDEMENT, SYNOVECTOMY ,  LEFT SHOULDER ;  Surgeon: Thera FlakeW D Caffrey Jr., MD;  Location: [facility];  Service: Orthopedics;  Laterality: Left;    There were no vitals filed for this visit.      Subjective Assessment - 02/13/16 1130    Subjective Pt stated he is feeling good today, reports compliance with HEP and stated he was sore yesterday.  No reoprts of pain today.   Pertinent History beta blockers, CHF/CAD, HTN, history of lumbar laminectomy, CABG, history of ventral hernia, history of AVN B hips, history of cervical corpectomy, brace  secondary to no sternum   Patient Stated Goals get leg working better, get walking    Currently in Pain? No/denies             [facility] Adult PT Treatment/Exercise - 02/13/16 0001    Knee/Hip Exercises: Seated   Sit to Sand 10 reps;with UE support   Knee/Hip Exercises: Supine   Short Arc Quad Sets AAROM  10 min with estim   Bridges 2 sets;10 reps   Other Supine Knee/Hip Exercises marching and clam x 10 each    Other Supine Knee/Hip Exercises hamset x 10    Knee/Hip Exercises: Sidelying   Hip ABduction 2 sets;10 reps   Modalities   Modalities Electrical Stimulation   Electrical Stimulation   Electrical Stimulation Location Lt quadriceps   Electrical Stimulation Action strengthening   Electrical Stimulation Parameters 19.5 mA CC   Electrical Stimulation Goals Strength                  PT Short Term Goals - 02/11/16 1016    PT SHORT TERM GOAL #1   Title Patient to be able to ambulate at least 48050ft with rollator and KAFO in order to demonstrate improvement in mobilty    Time 3   Period Weeks   Status On-going   PT SHORT TERM GOAL #2   Title Patient to be able to tolerate CKC exercise with KAFO unlocked and minimal unsteadiness/instability in order to demonstrate improvement in condition    Time 3   Period Weeks   Status On-going   PT SHORT TERM GOAL #3   Title Patient to be able to statically stand with KAFO locked and no UE support in order to demonstrate improved balance    Time 3   Period Weeks   Status On-going   PT SHORT TERM GOAL #4   Title Patient will  consistetly perform appropriate HEP correctly, to be updated PRN    Time 3   Period Weeks   Status On-going           PT Long Term Goals - 02/11/16 1016    PT LONG TERM GOAL #1   Title Patient will demonstrate L LE strength at least 4-/5 and 5/5 muslce strength in all other tested groups in order to assist in improving gait and stablity    Time 6   Period Weeks   Status On-going   PT LONG TERM  GOAL #2   Title Patient to be able to ambulate and perform functional mobility with AFO and rollator rather than KAFO in order to demonstrate improved strength and mobiltiy    Time 6   Period Weeks   Status On-going   PT LONG TERM GOAL #3   Title Patient to be able  to complete TUG balance test in 18 seconds or less wtih rollator in order to demonstrate improved mobility and reduced fall risk    Time 6   Period Weeks   Status On-going   PT LONG TERM GOAL #4   Title Patient to be able to ambulate at least 657ft with  rollator during in order to demonstrate improved overall mobilty    Time 6   Period Weeks   Status On-going               Plan - 02/13/16 1152    Clinical Impression Statement Pt late for apt today, unable to complete alll POC.  Session focus on improving Lt quad strengthening with estim co-contractions and active OKC.  Upon removal of electrodes good skin integrity with no signs of irritation.  No reports of pain through session.   Rehab Potential Good   PT Frequency 2x / week   PT Duration 6 weeks   PT Treatment/Interventions ADLs/Self Care Home Management;Electrical Stimulation;Gait training;Stair training;Functional mobility training;Therapeutic activities;Therapeutic exercise;Balance training;Neuromuscular re-education;Patient/family education;Manual techniques;Energy conservation;Taping   PT Next Visit Plan Progress functional strengthening, balance and gait training with CKC and OKC.      Patient will benefit from skilled therapeutic intervention in order to improve the following deficits and impairments:     Visit Diagnosis: Difficulty in walking, not elsewhere classified  Muscle weakness (generalized)  Unsteadiness on feet     Problem List Patient Active Problem List   Diagnosis Date Noted  . 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 Infection of urinary tract 10/26/2015  . Intractable back pain 10/22/2015  . Back pain 10/22/2015   . Muscle weakness (generalized) 12/05/2013  . Tight fascia 12/05/2013  . Decreased range of motion of right shoulder 12/05/2013  . Decreased range of motion of left shoulder 12/05/2013  . Bilateral leg weakness 11/21/2013  . Poor balance 11/21/2013  . Difficulty in walking(719.7) 11/21/2013  . Shoulder pain, left 09/12/2013  . Anemia, iron deficiency 09/09/2013  . Dyspnea 09/07/2013  . UTI (urinary tract infection) 09/07/2013  . Acute CHF (congestive heart failure) (HCC) 09/07/2013  . Elevated troponin 09/07/2013  . Fever and chills 09/06/2013  . Altered mental status 09/05/2013  . Neurogenic bowel 08/22/2013  . 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 Gout flare 08/22/2013  . Postoperative wound infection 08/22/2013  . Lumbar degenerative disc disease 08/02/2013  . Post-operative pain 07/29/2013  . Osteonecrosis (HCC) 05/05/2013  . Small bowel obstruction (HCC) 04/17/2013  . Acute renal failure (HCC) 04/17/2013  . Medial meniscus, posterior horn derangement 02/17/2013  . Knee sprain and strain 02/17/2013  . Trigger point with neck pain 03/18/2012  . Rotator cuff tear arthropathy of right shoulder 09/08/2011  . CAD in native artery 01/09/2011  . Ankle fracture 12/25/2010  . Contusion of shoulder, left 12/25/2010  . PEMPHIGUS VULGARIS 07/02/2010  . MRSA 05/13/2010  . PRURITUS 05/13/2010  . SPINAL STENOSIS, LUMBAR 05/13/2010  . HLD (hyperlipidemia) 04/24/2010  . GASTROESOPHAGEAL REFLUX DISEASE 04/24/2010  . VENTRAL HERNIA, INCISIONAL 04/24/2010  . DEGENERATIVE JOINT DISEASE 04/24/2010  . PYOGENIC ARTHRITIS, SHOULDER REGION 02/04/2010  . Essential hypertension 01/01/2010  . RUPTURE ROTATOR CUFF 02/19/2009   Becky Sax, LPTA; CBIS [phone]  Juel Burrow 02/13/2016, 12:11 PM  [facility] [address] [place], [place], [address] Phone: [phone]   Fax:  [phone]  Name: IVORY BAIL MRN: [id number] Date of Birth: 08/11/1940

## 2016-02-18 ENCOUNTER — Ambulatory Visit (HOSPITAL_COMMUNITY): Payer: Medicare Other | Admitting: Physical Therapy

## 2016-02-18 DIAGNOSIS — M6281 Muscle weakness (generalized): Secondary | ICD-10-CM | POA: Diagnosis not present

## 2016-02-18 DIAGNOSIS — R29898 Other symptoms and signs involving the musculoskeletal system: Secondary | ICD-10-CM | POA: Diagnosis not present

## 2016-02-18 DIAGNOSIS — R262 Difficulty in walking, not elsewhere classified: Secondary | ICD-10-CM

## 2016-02-18 DIAGNOSIS — R2681 Unsteadiness on feet: Secondary | ICD-10-CM | POA: Diagnosis not present

## 2016-02-18 NOTE — Therapy (Signed)
[facility] [address] [place], [place], [address] Phone: [phone]   Fax:  [phone]  Physical Therapy Treatment  Patient Details  Name: [name] MRN: [id number] Date of Birth: 1941-02-18 Referring Provider: [name]   Encounter Date: 02/18/2016      PT End of Session - 02/18/16 0929    Visit Number 4   Number of Visits 12   Date for PT Re-Evaluation 02/27/16   Authorization Type Medicare/[employer]    Authorization Time Period 02/06/16 to 03/19/16   Authorization - Visit Number 4   Authorization - Number of Visits 10   PT Start Time 0818   PT Stop Time 0858   PT Time Calculation (min) 40 min   Activity Tolerance Patient tolerated treatment well   Behavior During Therapy [facility] for tasks assessed/performed      Past Medical History:  Diagnosis Date  . Acute renal failure (HCC) 04/17/2013  . Anemia, iron deficiency 09/09/2013  . Anxiety   . Arthritis   . 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 Coronary artery disease    IN 2000   STENT PLACED IN 2012 sees Dr. Dietrich Pates, saw last 2013  . Disorder of blood    BEEN TREATED BY DERMATOLOGIST X 4 YRS..."BLOOD BLISTERS"  . Gout   . HTN (hypertension)    sees [name] in [place]  . Hx MRSA infection    rt shoulder  . Hyperlipidemia   . Pneumonia    "I've had it 3-4 times"  . Small bowel obstruction (HCC)   . Small bowel problem    HAD ALOT OF SCAR TISSUE FROM PREVIOUS SURGERIES..NG WAS INSERTED ...Marland KitchenMarland KitchenNO SURGERY NEEDED...Marland KitchenMarland KitchenIN FOR 8 DAYS  . Ventral hernia     Past Surgical History:  Procedure Laterality Date  . ANTERIOR CERVICAL CORPECTOMY  12/17/11  . ANTERIOR CERVICAL CORPECTOMY  12/17/2011   Procedure: ANTERIOR CERVICAL CORPECTOMY;  Surgeon: Karn Cassis, MD;  Location: [facility];  Service: Neurosurgery;  Laterality: N/A;  Anterior Cervical Decompression Fusion Five to Thoracic Two with plating  . BACK SURGERY     lumbar   . CATARACT EXTRACTION W/ INTRAOCULAR LENS  IMPLANT, BILATERAL  ? 2011  . CHOLECYSTECTOMY  2006 "or after"  . CORONARY ANGIOPLASTY WITH STENT PLACEMENT  2012  . CORONARY ARTERY BYPASS GRAFT  2000   CABG X5  . EYE SURGERY     bilateral cataract  . INCISION AND DRAINAGE OF WOUND  ~ 20ll; 12/03/11   "had infection in my right"  . LUMBAR LAMINECTOMY/DECOMPRESSION MICRODISCECTOMY  06/13/2011   Procedure: LUMBAR LAMINECTOMY/DECOMPRESSION MICRODISCECTOMY;  Surgeon: Karn Cassis;  [facility]: [facility];  Service: Neurosurgery;  Laterality: N/A;  Lumbar three, lumbar four-five Laminectomy  . LUMBAR LAMINECTOMY/DECOMPRESSION MICRODISCECTOMY Right 06/17/2013   Procedure: LUMBAR LAMINECTOMY/DECOMPRESSION MICRODISCECTOMY 1 LEVEL;  Surgeon: Karn Cassis, MD;  Location: [facility];  Service: Neurosurgery;  Laterality: Right;  Right L3-4 Microdiskectomy  . LUMBAR WOUND DEBRIDEMENT N/A 08/02/2013   Procedure: INCISION AND DRAINAGE OF LUMBAR WOUND DEBRIDEMENT;  Surgeon: Karn Cassis, MD;  Location: [facility];  Service: Neurosurgery;  Laterality: N/A;  . PERIPHERALLY INSERTED CENTRAL CATHETER INSERTION  2011 & 11/2011  . SHOULDER ARTHROSCOPY Left 09/13/2013   Procedure: ARTHROSCOPIC IRRIGATION AND DEBRIDEMENT, SYNOVECTOMY ,  LEFT SHOULDER ;  Surgeon: Thera Flake., MD;  Location: [facility];  Service: Orthopedics;  Laterality: Left;  . SHOULDER OPEN  ROTATOR CUFF REPAIR  ~ 2011   right  . STERNAL SURG  2000   HAS HAD 5-6 ON HIS STERNUM; "caught MRSA in it"  . TONSILLECTOMY  1949    There were no vitals filed for this visit.      Subjective Assessment - 02/18/16 0823    Subjective Patient arrives reporting that he is just doing OK today, did not have a great weekend    Pertinent History beta blockers, CHF/CAD, HTN, history of lumbar laminectomy, CABG, history of ventral hernia, history of AVN B hips, history of cervical corpectomy, brace secondary to no sternum   Currently in Pain? Yes   Pain  Score 3                          [facility] Adult PT Treatment/Exercise - 02/18/16 0001      Knee/Hip Exercises: Standing   Forward Lunges --   Forward Lunges Limitations attempted    Forward Step Up --   Forward Step Up Limitations attempted      Knee/Hip Exercises: Seated   Long Arc Quad Both;1 set;10 reps   Sit to [employer] with UE support;5 reps  knee unsteadiness/instability noted      Knee/Hip Exercises: Supine   Short Arc Quad Sets Both;1 set;Strengthening;10 reps   Bridges 2 sets;15 reps   Straight Leg Raises Both;10 reps;2 sets   Other Supine Knee/Hip Exercises marching and clam x 10 each    Other Supine Knee/Hip Exercises supine hip ABD 2x10              Balance Exercises - 02/18/16 0857      Balance Exercises: Standing   Standing Eyes Opened Narrow base of support (BOS);3 reps;10 secs;Other (comment)  U HHA    Tandem Stance Eyes open;2 reps;10 secs;Limitations  U HHA            PT Education - 02/18/16 [phone]    Education provided Yes   Education Details importance of participating in challenging tasks during therapy to foster progression    Person(s) Educated Patient   Methods Explanation   Comprehension Verbalized understanding          PT Short Term Goals - 02/11/16 1016      PT SHORT TERM GOAL #1   Title Patient to be able to ambulate at least 427ft with rollator and KAFO in order to demonstrate improvement in mobilty    Time 3   Period Weeks   Status On-going     PT SHORT TERM GOAL #2   Title Patient to be able to tolerate CKC exercise with KAFO unlocked and minimal unsteadiness/instability in order to demonstrate improvement in condition    Time 3   Period Weeks   Status On-going     PT SHORT TERM GOAL #3   Title Patient to be able to statically stand with KAFO locked and no UE support in order to demonstrate improved balance    Time 3   Period Weeks   Status On-going     PT SHORT TERM GOAL #4   Title Patient will   consistetly perform appropriate HEP correctly, to be updated PRN    Time 3   Period Weeks   Status On-going           PT Long Term Goals - 02/11/16 1016      PT LONG TERM GOAL #1   Title Patient will demonstrate L LE strength at least  4-/5 and 5/5 muslce strength in all other tested groups in order to assist in improving gait and stablity    Time 6   Period Weeks   Status On-going     PT LONG TERM GOAL #2   Title Patient to be able to ambulate and perform functional mobility with AFO and rollator rather than KAFO in order to demonstrate improved strength and mobiltiy    Time 6   Period Weeks   Status On-going     PT LONG TERM GOAL #3   Title Patient to be able to complete TUG balance test in 18 seconds or less wtih rollator in order to demonstrate improved mobility and reduced fall risk    Time 6   Period Weeks   Status On-going     PT LONG TERM GOAL #4   Title Patient to be able to ambulate at least 654ft with  rollator during in order to demonstrate improved overall mobilty    Time 6   Period Weeks   Status On-going               Plan - 02/18/16 0929    Clinical Impression Statement Continued working on functional strength  today, with patient able to complete full active SAQ today with L LE, demonstrating improved functional performance. Introduced CKC exercises today as well with brace on and rest breaks provided PRN; ended session with balance work  in parallel bars with min guard for safety. Patient did experience "swimmy-headed feeling" during PT today, BP 115/67, pulse 79 per auto-cuff and symptoms resolved with extended position in sitting. Attempted to trial CKC step ups and lunges, and did introduce some balance work however patient continues to state "I can't do that" to some exercises without fully attempting.    Rehab Potential Good   PT Frequency 2x / week   PT Duration 6 weeks   PT Treatment/Interventions ADLs/Self Care Home Management;Electrical  Stimulation;Gait training;Stair training;Functional mobility training;Therapeutic activities;Therapeutic exercise;Balance training;Neuromuscular re-education;Patient/family education;Manual techniques;Energy conservation;Taping   PT Next Visit Plan Progress functional strengthening, balance and gait training with CKC and OKC.   Consulted and Agree with Plan of Care Patient      Patient will benefit from skilled therapeutic intervention in order to improve the following deficits and impairments:  Abnormal gait, Decreased activity tolerance, Decreased strength, Decreased balance, Decreased mobility, Difficulty walking, Improper body mechanics, Decreased coordination, Impaired flexibility, Postural dysfunction  Visit Diagnosis: Difficulty in walking, not elsewhere classified  Muscle weakness (generalized)  Unsteadiness on feet     Problem List Patient Active Problem List   Diagnosis Date Noted  . 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 Infection of urinary tract 10/26/2015  . Intractable back pain 10/22/2015  . Back pain 10/22/2015  . Muscle weakness (generalized) 12/05/2013  . Tight fascia 12/05/2013  . Decreased range of motion of right shoulder 12/05/2013  . Decreased range of motion of left shoulder 12/05/2013  . Bilateral leg weakness 11/21/2013  . Poor balance 11/21/2013  . Difficulty in walking(719.7) 11/21/2013  . Shoulder pain, left 09/12/2013  . Anemia, iron deficiency 09/09/2013  . Dyspnea 09/07/2013  . UTI (urinary tract infection) 09/07/2013  . Acute CHF (congestive heart failure) (HCC) 09/07/2013  . Elevated troponin 09/07/2013  . Fever and chills 09/06/2013  . Altered mental status 09/05/2013  . Neurogenic bowel 08/22/2013  . 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 Gout flare 08/22/2013  . Postoperative wound infection 08/22/2013  .  Lumbar degenerative disc disease 08/02/2013  . Post-operative pain 07/29/2013  .  Osteonecrosis (HCC) 05/05/2013  . Small bowel obstruction (HCC) 04/17/2013  . Acute renal failure (HCC) 04/17/2013  . Medial meniscus, posterior horn derangement 02/17/2013  . Knee sprain and strain 02/17/2013  . Trigger point with neck pain 03/18/2012  . Rotator cuff tear arthropathy of right shoulder 09/08/2011  . CAD in native artery 01/09/2011  . Ankle fracture 12/25/2010  . Contusion of shoulder, left 12/25/2010  . PEMPHIGUS VULGARIS 07/02/2010  . MRSA 05/13/2010  . PRURITUS 05/13/2010  . SPINAL STENOSIS, LUMBAR 05/13/2010  . HLD (hyperlipidemia) 04/24/2010  . GASTROESOPHAGEAL REFLUX DISEASE 04/24/2010  . VENTRAL HERNIA, INCISIONAL 04/24/2010  . DEGENERATIVE JOINT DISEASE 04/24/2010  . PYOGENIC ARTHRITIS, SHOULDER REGION 02/04/2010  . Essential hypertension 01/01/2010  . RUPTURE ROTATOR CUFF 02/19/2009    Nedra Hai PT, DPT [id number]  [facility] [address] [place], [place], [address] Phone: [phone]   Fax:  [phone]  Name: MARSDEN ZAINO MRN: [id number] Date of Birth: 1941/01/21

## 2016-02-19 DIAGNOSIS — Z981 Arthrodesis status: Secondary | ICD-10-CM | POA: Diagnosis not present

## 2016-02-19 DIAGNOSIS — M4326 Fusion of spine, lumbar region: Secondary | ICD-10-CM | POA: Diagnosis not present

## 2016-02-19 DIAGNOSIS — M545 Low back pain: Secondary | ICD-10-CM | POA: Diagnosis not present

## 2016-02-20 ENCOUNTER — Ambulatory Visit (HOSPITAL_COMMUNITY): Payer: Medicare Other | Admitting: Physical Therapy

## 2016-02-20 DIAGNOSIS — R262 Difficulty in walking, not elsewhere classified: Secondary | ICD-10-CM | POA: Diagnosis not present

## 2016-02-20 DIAGNOSIS — M6281 Muscle weakness (generalized): Secondary | ICD-10-CM | POA: Diagnosis not present

## 2016-02-20 DIAGNOSIS — R2681 Unsteadiness on feet: Secondary | ICD-10-CM

## 2016-02-20 DIAGNOSIS — R29898 Other symptoms and signs involving the musculoskeletal system: Secondary | ICD-10-CM

## 2016-02-20 NOTE — Therapy (Signed)
[facility] [address] [place], [place], [address] Phone: [phone]   Fax:  [phone]  Physical Therapy Treatment  Patient Details  Name: [name] MRN: [id number] Date of Birth: 1940-12-31 Referring Provider: [name]   Encounter Date: 02/20/2016      PT End of Session - 02/20/16 0855    Visit Number 5   Number of Visits 12   Date for PT Re-Evaluation 02/27/16   Authorization Type Medicare/[employer]    Authorization Time Period 02/06/16 to 03/19/16   Authorization - Visit Number 5   Authorization - Number of Visits 10   PT Start Time 0817   PT Stop Time 0858   PT Time Calculation (min) 41 min   Activity Tolerance Patient tolerated treatment well   Behavior During Therapy [facility] for tasks assessed/performed      Past Medical History:  Diagnosis Date  . Acute renal failure (HCC) 04/17/2013  . Anemia, iron deficiency 09/09/2013  . Anxiety   . Arthritis   . 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 Coronary artery disease    IN 2000   STENT PLACED IN 2012 sees Dr. Dietrich Pates, saw last 2013  . Disorder of blood    BEEN TREATED BY DERMATOLOGIST X 4 YRS..."BLOOD BLISTERS"  . Gout   . HTN (hypertension)    sees [name] in [place]  . Hx MRSA infection    rt shoulder  . Hyperlipidemia   . Pneumonia    "I've had it 3-4 times"  . Small bowel obstruction (HCC)   . Small bowel problem    HAD ALOT OF SCAR TISSUE FROM PREVIOUS SURGERIES..NG WAS INSERTED ...Marland KitchenMarland KitchenNO SURGERY NEEDED...Marland KitchenMarland KitchenIN FOR 8 DAYS  . Ventral hernia     Past Surgical History:  Procedure Laterality Date  . ANTERIOR CERVICAL CORPECTOMY  12/17/11  . ANTERIOR CERVICAL CORPECTOMY  12/17/2011   Procedure: ANTERIOR CERVICAL CORPECTOMY;  Surgeon: Karn Cassis, MD;  Location: [facility];  Service: Neurosurgery;  Laterality: N/A;  Anterior Cervical Decompression Fusion Five to Thoracic Two with plating  . BACK SURGERY     lumbar   . CATARACT EXTRACTION W/ INTRAOCULAR LENS  IMPLANT, BILATERAL  ? 2011  . CHOLECYSTECTOMY  2006 "or after"  . CORONARY ANGIOPLASTY WITH STENT PLACEMENT  2012  . CORONARY ARTERY BYPASS GRAFT  2000   CABG X5  . EYE SURGERY     bilateral cataract  . INCISION AND DRAINAGE OF WOUND  ~ 20ll; 12/03/11   "had infection in my right"  . LUMBAR LAMINECTOMY/DECOMPRESSION MICRODISCECTOMY  06/13/2011   Procedure: LUMBAR LAMINECTOMY/DECOMPRESSION MICRODISCECTOMY;  Surgeon: Karn Cassis;  Location: [facility];  Service: Neurosurgery;  Laterality: N/A;  Lumbar three, lumbar four-five Laminectomy  . LUMBAR LAMINECTOMY/DECOMPRESSION MICRODISCECTOMY Right 06/17/2013   Procedure: LUMBAR LAMINECTOMY/DECOMPRESSION MICRODISCECTOMY 1 LEVEL;  Surgeon: Karn Cassis, MD;  Location: [facility];  Service: Neurosurgery;  Laterality: Right;  Right L3-4 Microdiskectomy  . LUMBAR WOUND DEBRIDEMENT N/A 08/02/2013   Procedure: INCISION AND DRAINAGE OF LUMBAR WOUND DEBRIDEMENT;  Surgeon: Karn Cassis, MD;  Location: [facility];  Service: Neurosurgery;  Laterality: N/A;  . PERIPHERALLY INSERTED CENTRAL CATHETER INSERTION  2011 & 11/2011  . SHOULDER ARTHROSCOPY Left 09/13/2013   Procedure: ARTHROSCOPIC IRRIGATION AND DEBRIDEMENT, SYNOVECTOMY ,  LEFT SHOULDER ;  Surgeon: Thera Flake., MD;  Location: [facility];  Service: Orthopedics;  Laterality: Left;  . SHOULDER OPEN  ROTATOR CUFF REPAIR  ~ 2011   right  . STERNAL SURG  2000   HAS HAD 5-6 ON HIS STERNUM; "caught MRSA in it"  . TONSILLECTOMY  1949    There were no vitals filed for this visit.      Subjective Assessment - 02/20/16 0821    Subjective Pt states he has not been doing his quad sets like he should    Pertinent History beta blockers, CHF/CAD, HTN, history of lumbar laminectomy, CABG, history of ventral hernia, history of AVN B hips, history of cervical corpectomy, brace secondary to no sternum   Currently in Pain? No/denies                          [facility] Adult PT Treatment/Exercise - 02/20/16 0001      Knee/Hip Exercises: Standing   Other Standing Knee Exercises weight shift x 10   Other Standing Knee Exercises side step at mat x 2 RT      Knee/Hip Exercises: Seated   Long Arc Quad 10 reps  AAROM at end range      Knee/Hip Exercises: Supine   Quad Sets 10 reps   Short Arc Quad Sets Strengthening;Both;15 reps   Short Arc Quad Sets Limitations Rt 6#; Lt 0#    Bridges 15 reps   Other Supine Knee/Hip Exercises hip abduction Lt x 15                   PT Short Term Goals - 02/20/16 0853      PT SHORT TERM GOAL #1   Title Patient to be able to ambulate at least 442ft with rollator and KAFO in order to demonstrate improvement in mobilty    Time 3   Period Weeks   Status On-going     PT SHORT TERM GOAL #2   Title Patient to be able to tolerate CKC exercise with KAFO unlocked and minimal unsteadiness/instability in order to demonstrate improvement in condition    Time 3   Period Weeks   Status On-going     PT SHORT TERM GOAL #3   Title Patient to be able to statically stand with KAFO locked and no UE support in order to demonstrate improved balance    Time 3   Period Weeks   Status On-going     PT SHORT TERM GOAL #4   Title Patient will  consistetly perform appropriate HEP correctly, to be updated PRN    Time 3   Period Weeks   Status On-going           PT Long Term Goals - 02/20/16 1607      PT LONG TERM GOAL #1   Title Patient will demonstrate L LE strength at least 4-/5 and 5/5 muslce strength in all other tested groups in order to assist in improving gait and stablity    Time 6   Period Weeks   Status On-going     PT LONG TERM GOAL #2   Title Patient to be able to ambulate and perform functional mobility with AFO and rollator rather than KAFO in order to demonstrate improved strength and mobiltiy    Time 6   Period Weeks   Status On-going     PT LONG  TERM GOAL #3   Title Patient to be able to complete TUG balance test in 18 seconds or less wtih rollator in order to demonstrate improved mobility and reduced fall risk  Time 6   Period Weeks   Status On-going     PT LONG TERM GOAL #4   Title Patient to be able to ambulate at least 624ft with  rollator during in order to demonstrate improved overall mobilty    Time 6   Period Weeks   Status On-going               Plan - 02/20/16 1610    Clinical Impression Statement Pt apprehensive to place wt on Lt LE when standing but able to do so momentarily with verbal and manual cuing.   Noted improvement in strength although subtle.  Pt will continue to need skilled threapy for strengthening and balance    Rehab Potential Good   PT Frequency 2x / week   PT Duration 6 weeks   PT Treatment/Interventions ADLs/Self Care Home Management;Electrical Stimulation;Gait training;Stair training;Functional mobility training;Therapeutic activities;Therapeutic exercise;Balance training;Neuromuscular re-education;Patient/family education;Manual techniques;Energy conservation;Taping   PT Next Visit Plan Progress functional strengthening, balance and gait training with CKC and OKC.   Consulted and Agree with Plan of Care Patient      Patient will benefit from skilled therapeutic intervention in order to improve the following deficits and impairments:  Abnormal gait, Decreased activity tolerance, Decreased strength, Decreased balance, Decreased mobility, Difficulty walking, Improper body mechanics, Decreased coordination, Impaired flexibility, Postural dysfunction  Visit Diagnosis: Difficulty in walking, not elsewhere classified  Muscle weakness (generalized)  Unsteadiness on feet  Bilateral leg weakness     Problem List Patient Active Problem List   Diagnosis Date Noted  . 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 Infection of urinary tract 10/26/2015  . Intractable back pain  10/22/2015  . Back pain 10/22/2015  . Muscle weakness (generalized) 12/05/2013  . Tight fascia 12/05/2013  . Decreased range of motion of right shoulder 12/05/2013  . Decreased range of motion of left shoulder 12/05/2013  . Bilateral leg weakness 11/21/2013  . Poor balance 11/21/2013  . Difficulty in walking(719.7) 11/21/2013  . Shoulder pain, left 09/12/2013  . Anemia, iron deficiency 09/09/2013  . Dyspnea 09/07/2013  . UTI (urinary tract infection) 09/07/2013  . Acute CHF (congestive heart failure) (HCC) 09/07/2013  . Elevated troponin 09/07/2013  . Fever and chills 09/06/2013  . Altered mental status 09/05/2013  . Neurogenic bowel 08/22/2013  . 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 Gout flare 08/22/2013  . Postoperative wound infection 08/22/2013  . Lumbar degenerative disc disease 08/02/2013  . Post-operative pain 07/29/2013  . Osteonecrosis (HCC) 05/05/2013  . Small bowel obstruction (HCC) 04/17/2013  . Acute renal failure (HCC) 04/17/2013  . Medial meniscus, posterior horn derangement 02/17/2013  . Knee sprain and strain 02/17/2013  . Trigger point with neck pain 03/18/2012  . Rotator cuff tear arthropathy of right shoulder 09/08/2011  . CAD in native artery 01/09/2011  . Ankle fracture 12/25/2010  . Contusion of shoulder, left 12/25/2010  . PEMPHIGUS VULGARIS 07/02/2010  . MRSA 05/13/2010  . PRURITUS 05/13/2010  . SPINAL STENOSIS, LUMBAR 05/13/2010  . HLD (hyperlipidemia) 04/24/2010  . GASTROESOPHAGEAL REFLUX DISEASE 04/24/2010  . VENTRAL HERNIA, INCISIONAL 04/24/2010  . DEGENERATIVE JOINT DISEASE 04/24/2010  . PYOGENIC ARTHRITIS, SHOULDER REGION 02/04/2010  . Essential hypertension 01/01/2010  . RUPTURE ROTATOR CUFF 02/19/2009   Virgina Organ, PT CLT [phone] 02/20/2016, 9:00 AM  [facility] [address] [place], [place], [address] Phone: [phone]   Fax:  [phone]  Name:  [name] MRN:  [id number] Date of Birth: [birth year]

## 2016-02-25 ENCOUNTER — Telehealth (HOSPITAL_COMMUNITY): Payer: Self-pay | Admitting: Physical Therapy

## 2016-02-25 ENCOUNTER — Ambulatory Visit (HOSPITAL_COMMUNITY): Payer: Medicare Other | Admitting: Physical Therapy

## 2016-02-25 NOTE — Telephone Encounter (Signed)
Called pt re missed appointment.  Pt states that he cancelled this appointment last week as he had a previous engagement.  Pt will Be here on Wednesday.   Virgina Organ, PT CLT [phone]

## 2016-02-27 ENCOUNTER — Ambulatory Visit (HOSPITAL_COMMUNITY): Payer: Medicare Other | Attending: Pulmonary Disease | Admitting: Physical Therapy

## 2016-02-27 DIAGNOSIS — M6281 Muscle weakness (generalized): Secondary | ICD-10-CM | POA: Diagnosis not present

## 2016-02-27 DIAGNOSIS — R262 Difficulty in walking, not elsewhere classified: Secondary | ICD-10-CM | POA: Diagnosis not present

## 2016-02-27 DIAGNOSIS — R2681 Unsteadiness on feet: Secondary | ICD-10-CM | POA: Insufficient documentation

## 2016-02-27 NOTE — Therapy (Signed)
[facility] [address] [place], [place], [address] Phone: [phone]   Fax:  [phone]  Physical Therapy Treatment  Patient Details  Name: [name] MRN: [id number] Date of Birth: Apr 08, 1941 Referring Provider: [name]   Encounter Date: 02/27/2016      PT End of Session - 02/27/16 0856    Visit Number 6   Number of Visits 12   Date for PT Re-Evaluation 02/27/16   Authorization Type Medicare/[employer]    Authorization Time Period 02/06/16 to 03/19/16   Authorization - Visit Number 6   Authorization - Number of Visits 10   PT Start Time 0815   PT Stop Time 0855   PT Time Calculation (min) 40 min   Activity Tolerance Patient tolerated treatment well   Behavior During Therapy [facility] for tasks assessed/performed      Past Medical History:  Diagnosis Date  . Acute renal failure (HCC) 04/17/2013  . Anemia, iron deficiency 09/09/2013  . Anxiety   . Arthritis   . 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 Coronary artery disease    IN 2000   STENT PLACED IN 2012 sees Dr. Dietrich Pates, saw last 2013  . Disorder of blood    BEEN TREATED BY DERMATOLOGIST X 4 YRS..."BLOOD BLISTERS"  . Gout   . HTN (hypertension)    sees [name] in [place]  . Hx MRSA infection    rt shoulder  . Hyperlipidemia   . Pneumonia    "I've had it 3-4 times"  . Small bowel obstruction (HCC)   . Small bowel problem    HAD ALOT OF SCAR TISSUE FROM PREVIOUS SURGERIES..NG WAS INSERTED ...Marland KitchenMarland KitchenNO SURGERY NEEDED...Marland KitchenMarland KitchenIN FOR 8 DAYS  . Ventral hernia     Past Surgical History:  Procedure Laterality Date  . ANTERIOR CERVICAL CORPECTOMY  12/17/11  . ANTERIOR CERVICAL CORPECTOMY  12/17/2011   Procedure: ANTERIOR CERVICAL CORPECTOMY;  Surgeon: Karn Cassis, MD;  Location: [facility];  Service: Neurosurgery;  Laterality: N/A;  Anterior Cervical Decompression Fusion Five to Thoracic Two with plating  . BACK SURGERY     lumbar   . CATARACT EXTRACTION W/ INTRAOCULAR LENS  IMPLANT, BILATERAL  ? 2011  . CHOLECYSTECTOMY  2006 "or after"  . CORONARY ANGIOPLASTY WITH STENT PLACEMENT  2012  . CORONARY ARTERY BYPASS GRAFT  2000   CABG X5  . EYE SURGERY     bilateral cataract  . INCISION AND DRAINAGE OF WOUND  ~ 20ll; 12/03/11   "had infection in my right"  . LUMBAR LAMINECTOMY/DECOMPRESSION MICRODISCECTOMY  06/13/2011   Procedure: LUMBAR LAMINECTOMY/DECOMPRESSION MICRODISCECTOMY;  Surgeon: Karn Cassis;  Location: [facility];  Service: Neurosurgery;  Laterality: N/A;  Lumbar three, lumbar four-five Laminectomy  . LUMBAR LAMINECTOMY/DECOMPRESSION MICRODISCECTOMY Right 06/17/2013   Procedure: LUMBAR LAMINECTOMY/DECOMPRESSION MICRODISCECTOMY 1 LEVEL;  Surgeon: Karn Cassis, MD;  Location: [facility];  Service: Neurosurgery;  Laterality: Right;  Right L3-4 Microdiskectomy  . LUMBAR WOUND DEBRIDEMENT N/A 08/02/2013   Procedure: INCISION AND DRAINAGE OF LUMBAR WOUND DEBRIDEMENT;  Surgeon: Karn Cassis, MD;  Location: [facility];  Service: Neurosurgery;  Laterality: N/A;  . PERIPHERALLY INSERTED CENTRAL CATHETER INSERTION  2011 & 11/2011  . SHOULDER ARTHROSCOPY Left 09/13/2013   Procedure: ARTHROSCOPIC IRRIGATION AND DEBRIDEMENT, SYNOVECTOMY ,  LEFT SHOULDER ;  Surgeon: Thera Flake., MD;  Location: [facility];  Service: Orthopedics;  Laterality: Left;  . SHOULDER OPEN  ROTATOR CUFF REPAIR  ~ 2011   right  . STERNAL SURG  2000   HAS HAD 5-6 ON HIS STERNUM; "caught MRSA in it"  . TONSILLECTOMY  1949    There were no vitals filed for this visit.      Subjective Assessment - 02/27/16 0818    Subjective Pt states that he was very sore after last treatment and had mm spasms    Pertinent History beta blockers, CHF/CAD, HTN, history of lumbar laminectomy, CABG, history of ventral hernia, history of AVN B hips, history of cervical corpectomy, brace secondary to no sternum   Pain Score 4    Pain Location Back   Pain  Orientation Lower   Pain Descriptors / Indicators Sharp   Pain Type Chronic pain   Pain Onset More than a month ago   Pain Frequency Intermittent   Aggravating Factors  weight bearing activity   Pain Relieving Factors nothing   Effect of Pain on Daily Activities increases                         [facility] Adult PT Treatment/Exercise - 02/27/16 0001      Exercises   Exercises Lumbar     Lumbar Exercises: Quadruped   Other Quadruped Lumbar Exercises crawling x 4 reps    Other Quadruped Lumbar Exercises tall kneeling holding balance and weight shifting      Knee/Hip Exercises: Stretches   Other Knee/Hip Stretches child pose x 4 x 20"     Knee/Hip Exercises: Standing   Other Standing Knee Exercises weight shift x 10   Other Standing Knee Exercises abduct Rt LE; then Lt x 5; stand as tall as pt can.       Knee/Hip Exercises: Seated   Long Arc Quad 10 reps   Long Arc Quad Limitations AROM until -20 then [employer]                  PT Short Term Goals - 02/27/16 0902      PT SHORT TERM GOAL #1   Title Patient to be able to ambulate at least 49ft with rollator and KAFO in order to demonstrate improvement in mobilty    Time 3   Period Weeks   Status On-going     PT SHORT TERM GOAL #2   Title Patient to be able to tolerate CKC exercise with KAFO unlocked and minimal unsteadiness/instability in order to demonstrate improvement in condition    Time 3   Period Weeks   Status On-going     PT SHORT TERM GOAL #3   Title Patient to be able to statically stand with KAFO locked and no UE support in order to demonstrate improved balance    Time 3   Period Weeks   Status On-going     PT SHORT TERM GOAL #4   Title Patient will  consistetly perform appropriate HEP correctly, to be updated PRN    Time 3   Period Weeks   Status On-going           PT Long Term Goals - 02/27/16 0902      PT LONG TERM GOAL #1   Title Patient will demonstrate L LE strength at  least 4-/5 and 5/5 muslce strength in all other tested groups in order to assist in improving gait and stablity    Time 6   Period Weeks   Status On-going     PT LONG TERM  GOAL #2   Title Patient to be able to ambulate and perform functional mobility with AFO and rollator rather than KAFO in order to demonstrate improved strength and mobiltiy    Time 6   Period Weeks   Status On-going     PT LONG TERM GOAL #3   Title Patient to be able to complete TUG balance test in 18 seconds or less wtih rollator in order to demonstrate improved mobility and reduced fall risk    Time 6   Period Weeks   Status On-going     PT LONG TERM GOAL #4   Title Patient to be able to ambulate at least 681ft with  rollator during in order to demonstrate improved overall mobilty    Time 6   Period Weeks   Status On-going               Plan - 02/27/16 0856    Clinical Impression Statement Began quadriped and tall kneeling activity to promote core and hip stability.  D/C side stepping as this increases back pain instead completed standing abduction activity.    Rehab Potential Good   PT Frequency 2x / week   PT Duration 6 weeks   PT Treatment/Interventions ADLs/Self Care Home Management;Electrical Stimulation;Gait training;Stair training;Functional mobility training;Therapeutic activities;Therapeutic exercise;Balance training;Neuromuscular re-education;Patient/family education;Manual techniques;Energy conservation;Taping   PT Next Visit Plan Progress functional strengthening, balance and gait training with CKC and OKC.   Consulted and Agree with Plan of Care Patient      Patient will benefit from skilled therapeutic intervention in order to improve the following deficits and impairments:  Abnormal gait, Decreased activity tolerance, Decreased strength, Decreased balance, Decreased mobility, Difficulty walking, Improper body mechanics, Decreased coordination, Impaired flexibility, Postural  dysfunction  Visit Diagnosis: Difficulty in walking, not elsewhere classified  Muscle weakness (generalized)  Unsteadiness on feet     Problem List Patient Active Problem List   Diagnosis Date Noted  . 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 Infection of urinary tract 10/26/2015  . Intractable back pain 10/22/2015  . Back pain 10/22/2015  . Muscle weakness (generalized) 12/05/2013  . Tight fascia 12/05/2013  . Decreased range of motion of right shoulder 12/05/2013  . Decreased range of motion of left shoulder 12/05/2013  . Bilateral leg weakness 11/21/2013  . Poor balance 11/21/2013  . Difficulty in walking(719.7) 11/21/2013  . Shoulder pain, left 09/12/2013  . Anemia, iron deficiency 09/09/2013  . Dyspnea 09/07/2013  . UTI (urinary tract infection) 09/07/2013  . Acute CHF (congestive heart failure) (HCC) 09/07/2013  . Elevated troponin 09/07/2013  . Fever and chills 09/06/2013  . Altered mental status 09/05/2013  . Neurogenic bowel 08/22/2013  . 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 Gout flare 08/22/2013  . Postoperative wound infection 08/22/2013  . Lumbar degenerative disc disease 08/02/2013  . Post-operative pain 07/29/2013  . Osteonecrosis (HCC) 05/05/2013  . Small bowel obstruction (HCC) 04/17/2013  . Acute renal failure (HCC) 04/17/2013  . Medial meniscus, posterior horn derangement 02/17/2013  . Knee sprain and strain 02/17/2013  . Trigger point with neck pain 03/18/2012  . Rotator cuff tear arthropathy of right shoulder 09/08/2011  . CAD in native artery 01/09/2011  . Ankle fracture 12/25/2010  . Contusion of shoulder, left 12/25/2010  . PEMPHIGUS VULGARIS 07/02/2010  . MRSA 05/13/2010  . PRURITUS 05/13/2010  . SPINAL STENOSIS, LUMBAR 05/13/2010  . HLD (hyperlipidemia) 04/24/2010  . GASTROESOPHAGEAL REFLUX DISEASE 04/24/2010  . VENTRAL HERNIA,  INCISIONAL 04/24/2010  . DEGENERATIVE JOINT DISEASE 04/24/2010  .  PYOGENIC ARTHRITIS, SHOULDER REGION 02/04/2010  . Essential hypertension 01/01/2010  . RUPTURE ROTATOR CUFF 02/19/2009    Virgina Organ, PT CLT [phone] 02/27/2016, 9:02 AM  [facility] [address] [place], [place], [address] Phone: [phone]   Fax:  [phone]  Name: [name] MRN: [id number] Date of Birth: 1940-09-17

## 2016-02-28 ENCOUNTER — Encounter (HOSPITAL_COMMUNITY): Payer: Medicare Other

## 2016-02-28 ENCOUNTER — Ambulatory Visit (INDEPENDENT_AMBULATORY_CARE_PROVIDER_SITE_OTHER): Payer: Medicare Other | Admitting: Internal Medicine

## 2016-02-29 ENCOUNTER — Other Ambulatory Visit: Payer: Self-pay | Admitting: Internal Medicine

## 2016-03-03 ENCOUNTER — Ambulatory Visit (HOSPITAL_COMMUNITY): Payer: Medicare Other | Admitting: Physical Therapy

## 2016-03-03 DIAGNOSIS — M6281 Muscle weakness (generalized): Secondary | ICD-10-CM | POA: Diagnosis not present

## 2016-03-03 DIAGNOSIS — R262 Difficulty in walking, not elsewhere classified: Secondary | ICD-10-CM

## 2016-03-03 DIAGNOSIS — R2681 Unsteadiness on feet: Secondary | ICD-10-CM

## 2016-03-03 NOTE — Therapy (Signed)
[name] [address] [place], [place], [address] Phone: [phone]   Fax:  [phone]  Physical Therapy Treatment  Patient Details  Name: [name] MRN: [id number] Date of Birth: [birth year] Referring Provider: [name]   Encounter Date: 03/03/2016      PT End of Session - 03/03/16 0935    Visit Number 7   Number of Visits 12   Date for PT Re-Evaluation 02/27/16   Authorization Type Medicare/[employer]    Authorization Time Period 02/06/16 to 03/19/16   Authorization - Visit Number 7   Authorization - Number of Visits 10   PT Start Time 0900   PT Stop Time 0943   PT Time Calculation (min) 43 min   Activity Tolerance Patient tolerated treatment well   Behavior During Therapy [facility] for tasks assessed/performed      Past Medical History:  Diagnosis Date  . Acute renal failure (HCC) 04/17/2013  . Anemia, iron deficiency 09/09/2013  . Anxiety   . Arthritis   . 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 Coronary artery disease    IN 2000   STENT PLACED IN 2012 sees Dr. Dietrich Pates, saw last 2013  . Disorder of blood    BEEN TREATED BY DERMATOLOGIST X 4 YRS..."BLOOD BLISTERS"  . Gout   . HTN (hypertension)    sees [name] in [place]  . Hx MRSA infection    rt shoulder  . Hyperlipidemia   . Pneumonia    "I've had it 3-4 times"  . Small bowel obstruction (HCC)   . Small bowel problem    HAD ALOT OF SCAR TISSUE FROM PREVIOUS SURGERIES..NG WAS INSERTED ...Marland KitchenMarland KitchenNO SURGERY NEEDED...Marland KitchenMarland KitchenIN FOR 8 DAYS  . Ventral hernia     Past Surgical History:  Procedure Laterality Date  . ANTERIOR CERVICAL CORPECTOMY  12/17/11  . ANTERIOR CERVICAL CORPECTOMY  12/17/2011   Procedure: ANTERIOR CERVICAL CORPECTOMY;  Surgeon: Karn Cassis, MD;  [facility]: [facility];  Service: Neurosurgery;  Laterality: N/A;  Anterior Cervical Decompression Fusion Five to Thoracic Two with plating  . BACK SURGERY     lumbar   . CATARACT EXTRACTION W/ INTRAOCULAR LENS  IMPLANT, BILATERAL  ? 2011  . CHOLECYSTECTOMY  2006 "or after"  . CORONARY ANGIOPLASTY WITH STENT PLACEMENT  2012  . CORONARY ARTERY BYPASS GRAFT  2000   CABG X5  . EYE SURGERY     bilateral cataract  . INCISION AND DRAINAGE OF WOUND  ~ 20ll; 12/03/11   "had infection in my right"  . LUMBAR LAMINECTOMY/DECOMPRESSION MICRODISCECTOMY  06/13/2011   Procedure: LUMBAR LAMINECTOMY/DECOMPRESSION MICRODISCECTOMY;  Surgeon: Karn Cassis;  Location: [facility];  Service: Neurosurgery;  Laterality: N/A;  Lumbar three, lumbar four-five Laminectomy  . LUMBAR LAMINECTOMY/DECOMPRESSION MICRODISCECTOMY Right 06/17/2013   Procedure: LUMBAR LAMINECTOMY/DECOMPRESSION MICRODISCECTOMY 1 LEVEL;  Surgeon: Karn Cassis, MD;  [facility]: [facility];  Service: Neurosurgery;  Laterality: Right;  Right L3-4 Microdiskectomy  . LUMBAR WOUND DEBRIDEMENT N/A 08/02/2013   Procedure: INCISION AND DRAINAGE OF LUMBAR WOUND DEBRIDEMENT;  Surgeon: Karn Cassis, MD;  [facility]: [facility];  Service: Neurosurgery;  Laterality: N/A;  . PERIPHERALLY INSERTED CENTRAL CATHETER INSERTION  2011 & 11/2011  . SHOULDER ARTHROSCOPY Left 09/13/2013   Procedure: ARTHROSCOPIC IRRIGATION AND DEBRIDEMENT, SYNOVECTOMY ,  LEFT SHOULDER ;  Surgeon: Thera Flake., MD;  Location: [facility];  Service: Orthopedics;  Laterality: Left;  . SHOULDER OPEN  ROTATOR CUFF REPAIR  ~ 2011   right  . STERNAL SURG  2000   HAS HAD 5-6 ON HIS STERNUM; "caught MRSA in it"  . TONSILLECTOMY  1949    There were no vitals filed for this visit.      Subjective Assessment - 03/03/16 0905    Subjective Pt states that he had less spasms after crawling and stretching out.    Pertinent History beta blockers, CHF/CAD, HTN, history of lumbar laminectomy, CABG, history of ventral hernia, history of AVN B hips, history of cervical corpectomy, brace secondary to no sternum   Pain Score 1    Pain Location Back   Pain  Orientation Lower   Pain Descriptors / Indicators Aching;Tightness   Pain Onset More than a month ago                         [facility] - 03/03/16 0001      Exercises   Exercises Lumbar;Knee/Hip     Lumbar Exercises: Stretches   Passive Hamstring Stretch 3 reps;30 seconds     Lumbar Exercises: Supine   Other Supine Lumbar Exercises dead bug x 10     Lumbar Exercises: Quadruped   Madcat/Old Horse 5 reps   Other Quadruped Lumbar Exercises crawling x 4 reps    Other Quadruped Lumbar Exercises tall kneeling holding balance and weight shifting      Knee/Hip Exercises: Stretches   Other Knee/Hip Stretches child pose x 4 x 20"     Knee/Hip Exercises: Seated   Sit to Sand 4 reps     Knee/Hip Exercises: Sidelying   Hip ABduction Strengthening;Both;10 reps   Hip ABduction Limitations AA on LT      Knee/Hip Exercises: Prone   Hamstring Curl 10 reps   Hamstring Curl Limitations Lt AA   Hip Extension Both;10 reps   Hip Extension Limitations knee bent at 90 degrees                   PT Short Term Goals - 03/03/16 0934      PT SHORT TERM GOAL #1   Title Patient to be able to ambulate at least 46750ft with rollator and KAFO in order to demonstrate improvement in mobilty    Time 3   Period Weeks   Status On-going     PT SHORT TERM GOAL #2   Title Patient to be able to tolerate CKC exercise with KAFO unlocked and minimal unsteadiness/instability in order to demonstrate improvement in condition    Time 3   Period Weeks   Status On-going     PT SHORT TERM GOAL #3   Title Patient to be able to statically stand with KAFO locked and no UE support in order to demonstrate improved balance    Time 3   Period Weeks   Status On-going     PT SHORT TERM GOAL #4   Title Patient will  consistetly perform appropriate HEP correctly, to be updated PRN    Time 3   Period Weeks   Status On-going           PT Long Term Goals - 03/03/16  0934      PT LONG TERM GOAL #1   Title Patient will demonstrate L LE strength at least 4-/5 and 5/5 muslce strength in all other tested groups in order to assist in improving gait and stablity    Time 6   Period  Weeks   Status On-going     PT LONG TERM GOAL #2   Title Patient to be able to ambulate and perform functional mobility with AFO and rollator rather than KAFO in order to demonstrate improved strength and mobiltiy    Time 6   Period Weeks   Status On-going     PT LONG TERM GOAL #3   Title Patient to be able to complete TUG balance test in 18 seconds or less wtih rollator in order to demonstrate improved mobility and reduced fall risk    Time 6   Period Weeks   Status On-going     PT LONG TERM GOAL #4   Title Patient to be able to ambulate at least 618ft with  rollator during in order to demonstrate improved overall mobilty    Time 6   Period Weeks   Status On-going               Plan - 03/03/16 0917    Clinical Impression Statement Pt  improved in quadriped exercises.  Added prone SLR and supine dead bug to improve core and gluteal strength.    Rehab Potential Good   PT Frequency 2x / week   PT Duration 6 weeks   PT Treatment/Interventions ADLs/Self Care Home Management;Electrical Stimulation;Gait training;Stair training;Functional mobility training;Therapeutic activities;Therapeutic exercise;Balance training;Neuromuscular re-education;Patient/family education;Manual techniques;Energy conservation;Taping   PT Next Visit Plan Progress functional strengthening, balance and gait training with CKC and OKC.   Consulted and Agree with Plan of Care Patient      Patient will benefit from skilled therapeutic intervention in order to improve the following deficits and impairments:  Abnormal gait, Decreased activity tolerance, Decreased strength, Decreased balance, Decreased mobility, Difficulty walking, Improper body mechanics, Decreased coordination, Impaired  flexibility, Postural dysfunction  Visit Diagnosis: Difficulty in walking, not elsewhere classified  Muscle weakness (generalized)  Unsteadiness on feet     Problem List Patient Active Problem List   Diagnosis Date Noted  . 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 Infection of urinary tract 10/26/2015  . Intractable back pain 10/22/2015  . Back pain 10/22/2015  . Muscle weakness (generalized) 12/05/2013  . Tight fascia 12/05/2013  . Decreased range of motion of right shoulder 12/05/2013  . Decreased range of motion of left shoulder 12/05/2013  . Bilateral leg weakness 11/21/2013  . Poor balance 11/21/2013  . Difficulty in walking(719.7) 11/21/2013  . Shoulder pain, left 09/12/2013  . Anemia, iron deficiency 09/09/2013  . Dyspnea 09/07/2013  . UTI (urinary tract infection) 09/07/2013  . Acute CHF (congestive heart failure) (HCC) 09/07/2013  . Elevated troponin 09/07/2013  . Fever and chills 09/06/2013  . Altered mental status 09/05/2013  . Neurogenic bowel 08/22/2013  . 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 Gout flare 08/22/2013  . Postoperative wound infection 08/22/2013  . Lumbar degenerative disc disease 08/02/2013  . Post-operative pain 07/29/2013  . Osteonecrosis (HCC) 05/05/2013  . Small bowel obstruction (HCC) 04/17/2013  . Acute renal failure (HCC) 04/17/2013  . Medial meniscus, posterior horn derangement 02/17/2013  . Knee sprain and strain 02/17/2013  . Trigger point with neck pain 03/18/2012  . Rotator cuff tear arthropathy of right shoulder 09/08/2011  . CAD in native artery 01/09/2011  . Ankle fracture 12/25/2010  . Contusion of shoulder, left 12/25/2010  . PEMPHIGUS VULGARIS 07/02/2010  . MRSA 05/13/2010  . PRURITUS 05/13/2010  . SPINAL STENOSIS, LUMBAR 05/13/2010  . HLD (hyperlipidemia) 04/24/2010  . GASTROESOPHAGEAL REFLUX  DISEASE 04/24/2010  . VENTRAL HERNIA, INCISIONAL 04/24/2010  . DEGENERATIVE JOINT  DISEASE 04/24/2010  . PYOGENIC ARTHRITIS, SHOULDER REGION 02/04/2010  . Essential hypertension 01/01/2010  . RUPTURE ROTATOR CUFF 02/19/2009    Virgina Organ, PT CLT [phone] 03/03/2016, 9:45 AM  [facility] [address] [place], [place], [address] Phone: [phone]   Fax:  [phone]  Name: [name] MRN: [id number] Date of Birth: 1940-12-12

## 2016-03-06 ENCOUNTER — Ambulatory Visit (HOSPITAL_COMMUNITY): Payer: Medicare Other | Admitting: Physical Therapy

## 2016-03-06 DIAGNOSIS — M6281 Muscle weakness (generalized): Secondary | ICD-10-CM | POA: Diagnosis not present

## 2016-03-06 DIAGNOSIS — R262 Difficulty in walking, not elsewhere classified: Secondary | ICD-10-CM

## 2016-03-06 DIAGNOSIS — R2681 Unsteadiness on feet: Secondary | ICD-10-CM | POA: Diagnosis not present

## 2016-03-06 NOTE — Therapy (Signed)
[facility] [address] [place], [place], [address] Phone: [phone]   Fax:  [phone]  Physical Therapy Treatment  Patient Details  Name: [name] MRN: [id number] Date of Birth: 03/31/1941 Referring Provider: Sinda Du   Encounter Date: 03/06/2016      PT End of Session - 03/06/16 1055    Visit Number 10   Number of Visits 20   Date for PT Re-Evaluation 02/27/16   Authorization Type Medicare/[employer]    Authorization Time Period 03/06/2016 to 04/10/16   g code done on visit 10    Authorization - Visit Number 10   Authorization - Number of Visits 20   PT Start Time 0905   PT Stop Time 0948   PT Time Calculation (min) 43 min   Equipment Utilized During Treatment Gait belt   Activity Tolerance Patient tolerated treatment well   Behavior During Therapy [facility] for tasks assessed/performed      Past Medical History:  Diagnosis Date  . Acute renal failure ([place]) 04/17/2013  . Anemia, iron deficiency 09/09/2013  . Anxiety   . Arthritis   . 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 Coronary artery disease    IN 2000   STENT PLACED IN 2012 sees Dr. Lattie Haw, saw last 2013  . Disorder of blood    BEEN TREATED BY DERMATOLOGIST X 4 YRS..."BLOOD BLISTERS"  . Gout   . HTN (hypertension)    sees Dr. [name] in [place]  . Hx MRSA infection    rt shoulder  . Hyperlipidemia   . Pneumonia    "I've had it 3-4 times"  . Small bowel obstruction ([place])   . Small bowel problem    HAD ALOT OF SCAR TISSUE FROM PREVIOUS SURGERIES..NG WAS INSERTED ...Marland KitchenMarland KitchenNO SURGERY NEEDED...Marland KitchenMarland KitchenIN FOR 8 DAYS  . Ventral hernia     Past Surgical History:  Procedure Laterality Date  . ANTERIOR CERVICAL CORPECTOMY  12/17/11  . ANTERIOR CERVICAL CORPECTOMY  12/17/2011   Procedure: ANTERIOR CERVICAL CORPECTOMY;  Surgeon: Floyce Stakes, MD;  Location: [place] [facility];  Service: Neurosurgery;  Laterality: N/A;  Anterior Cervical  Decompression Fusion Five to Thoracic Two with plating  . BACK SURGERY     lumbar  . CATARACT EXTRACTION W/ INTRAOCULAR LENS  IMPLANT, BILATERAL  ? 2011  . CHOLECYSTECTOMY  2006 "or after"  . CORONARY ANGIOPLASTY WITH STENT PLACEMENT  2012  . CORONARY ARTERY BYPASS GRAFT  2000   CABG X5  . EYE SURGERY     bilateral cataract  . INCISION AND DRAINAGE OF WOUND  ~ 20ll; 12/03/11   "had infection in my right"  . LUMBAR LAMINECTOMY/DECOMPRESSION MICRODISCECTOMY  06/13/2011   Procedure: LUMBAR LAMINECTOMY/DECOMPRESSION MICRODISCECTOMY;  Surgeon: Floyce Stakes;  Location: [place] [facility] ORS;  Service: Neurosurgery;  Laterality: N/A;  Lumbar three, lumbar four-five Laminectomy  . LUMBAR LAMINECTOMY/DECOMPRESSION MICRODISCECTOMY Right 06/17/2013   Procedure: LUMBAR LAMINECTOMY/DECOMPRESSION MICRODISCECTOMY 1 LEVEL;  Surgeon: Floyce Stakes, MD;  Location: [place] [place] [facility];  Service: Neurosurgery;  Laterality: Right;  Right L3-4 Microdiskectomy  . LUMBAR WOUND DEBRIDEMENT N/A 08/02/2013   Procedure: INCISION AND DRAINAGE OF LUMBAR WOUND DEBRIDEMENT;  Surgeon: Floyce Stakes, MD;  [facility];  Service: Neurosurgery;  Laterality: N/A;  . [place] CATHETER INSERTION  2011 & 11/2011  . SHOULDER ARTHROSCOPY Left 09/13/2013   Procedure: ARTHROSCOPIC IRRIGATION AND DEBRIDEMENT, SYNOVECTOMY ,  LEFT SHOULDER ;  Surgeon: Patricia Nettle  Valeta Harms., MD;  Location: [facility];  Service: Orthopedics;  Laterality: Left;  . SHOULDER OPEN ROTATOR CUFF REPAIR  ~ 2011   right  . STERNAL SURG  2000   HAS HAD 5-6 ON HIS STERNUM; "caught MRSA in it"  . TONSILLECTOMY  1949    There were no vitals filed for this visit.      Subjective Assessment - 03/06/16 0914    Pertinent History beta blockers, CHF/CAD, HTN, history of lumbar laminectomy, CABG, history of ventral hernia, history of AVN B hips, history of cervical corpectomy, brace secondary to no sternum   Pain Onset In the past 7 days             [facility] PT Assessment - 03/06/16 0001      Assessment   Medical Diagnosis back fractuer of second lumbar vertebra    Referring Provider Sinda Du    Onset Date/Surgical Date --  about 3 months ago    Next MD Visit Dr. [name] in 6 weeks      Precautions   Precaution Comments no lifting anything heavy; otherwise stay within pain limits per patient but MD may update precautions next week      Balance Screen   Has the patient had a decrease in activity level because of a fear of falling?  No   Is the patient reluctant to leave their home because of a fear of falling?  No     Prior Function   Level of Independence Independent;Independent with basic ADLs;Independent with gait;Independent with transfers   Vocation Retired   Leisure none, just get back to walking      Strength   Right Hip Flexion 5/5  was 4/5    Right Hip Extension 3/5   Right Hip ABduction 3-/5  measured in sitting    Left Hip Flexion 3/5  was 2/5    Left Hip Extension 3/5   Left Hip ABduction 3-/5  was 3-/5    Right Knee Flexion 3/5   Right Knee Extension 5/5  was 4+/5    Left Knee Flexion 2/5  was 1/5    Left Knee Extension 3+/5  was 3/5    Right Ankle Dorsiflexion 3+/5   Left Ankle Dorsiflexion 1/5     Ambulation/Gait   Gait Comments use of KAFO throughout gait as patient''s L LE buckles without; reduced heel-toe pattern and gait speed, flexed at hips, excessive use of UEs on walker      6 minute walk test results    Aerobic Endurance Distance Walked 226   Endurance additional comments 2:30MWT, rollator, KAFO   fatigued at this point and needed to rest      High Level Balance   High Level Balance Comments TUG 36.6 with KAFO and rollator                      [facility] Adult PT Treatment/Exercise - 03/06/16 0001      Exercises   Exercises Lumbar;Knee/Hip     Lumbar Exercises: Stretches   Passive Hamstring Stretch 3 reps;30 seconds   Prone Mid Back Stretch 3 reps;30 seconds      Lumbar Exercises: Supine   Other Supine Lumbar Exercises dead bug x 10     Lumbar Exercises: Sidelying   Hip Abduction 10 reps     Lumbar Exercises: Prone   Other Prone Lumbar Exercises bilateral knee flexion x 10     Lumbar Exercises: Quadruped   Madcat/Old Horse 5  reps   Other Quadruped Lumbar Exercises quadriped unweight UE x 10, crawling x 4 reps    Other Quadruped Lumbar Exercises tall kneeling holding balance and weight shifting      Knee/Hip Exercises: Stretches   Other Knee/Hip Stretches child pose x 4 x 20"     Knee/Hip Exercises: Seated   Sit to [employer] 10 reps     Knee/Hip Exercises: Sidelying   Hip ABduction Strengthening;Both;10 reps   Hip ABduction Limitations AA on LT      Knee/Hip Exercises: Prone   Hamstring Curl 10 reps   Hamstring Curl Limitations Lt AA   Hip Extension Both;10 reps   Hip Extension Limitations knee bent at 90 degrees                   PT Short Term Goals - 03/06/16 0945      PT SHORT TERM GOAL #1   Title Patient to be able to ambulate at least 462f with rollator and KAFO in order to demonstrate improvement in mobilty    Time 3   Period Weeks   Status On-going     PT SHORT TERM GOAL #2   Title Patient to be able to tolerate CKC exercise with KAFO unlocked and minimal unsteadiness/instability in order to demonstrate improvement in condition    Time 3   Period Weeks   Status Partially Met     PT SHORT TERM GOAL #3   Title Patient to be able to statically stand with KAFO locked and no UE support in order to demonstrate improved balance    Time 3   Period Weeks   Status On-going     PT SHORT TERM GOAL #4   Title Patient will  consistetly perform appropriate HEP correctly, to be updated PRN    Time 3   Period Weeks   Status Achieved           PT Long Term Goals - 03/06/16 0946      PT LONG TERM GOAL #1   Title Patient will demonstrate L LE strength at least 4-/5 and 5/5 muslce strength in all other tested groups in  order to assist in improving gait and stablity    Time 6   Period Weeks   Status On-going     PT LONG TERM GOAL #2   Title Patient to be able to ambulate and perform functional mobility with AFO and rollator rather than KAFO in order to demonstrate improved strength and mobiltiy    Time 6   Period Weeks   Status On-going     PT LONG TERM GOAL #3   Title Patient to be able to complete TUG balance test in 18 seconds or less wtih rollator in order to demonstrate improved mobility and reduced fall risk    Baseline 8/10: 26 seconds ( significant time to unlock and lock brace   Time 6   Period Weeks   Status On-going     PT LONG TERM GOAL #4   Title Patient to be able to ambulate at least 6566fwith  rollator during 3MWT in order to demonstrate improved overall mobilty    Time 6   Period Weeks   Status On-going               Plan - 03/06/16 1131    Clinical Impression Statement Pt reassessed today.  Pt has increased muscular strength in almost all mm except Dorsiflexion and hamstring mm. Hamstring mm was tested prone  and not sitting this time which may account for discrepency.  Pt velocity in walking has increased but he still needs to rest at 225' due to back pain and fatigue.  PT will continue to benefit from skilled physical therapy to continue strengthening of core and LE mm and to continue to work on balance.     Rehab Potential Good   PT Frequency 2x / week   PT Duration --  increase from 6 to 10 weeks.    PT Treatment/Interventions ADLs/Self Care Home Management;Electrical Stimulation;Gait training;Stair training;Functional mobility training;Therapeutic activities;Therapeutic exercise;Balance training;Neuromuscular re-education;Patient/family education;Manual techniques;Energy conservation;Taping   PT Next Visit Plan Progress functional strengthening, balance and gait training with CKC and OKC.   Consulted and Agree with Plan of Care Patient      Patient will benefit  from skilled therapeutic intervention in order to improve the following deficits and impairments:  Abnormal gait, Decreased activity tolerance, Decreased strength, Decreased balance, Decreased mobility, Difficulty walking, Improper body mechanics, Decreased coordination, Impaired flexibility, Postural dysfunction  Visit Diagnosis: Difficulty in walking, not elsewhere classified  Muscle weakness (generalized)  Unsteadiness on feet       G-Codes - 03-19-2016 1028    Functional Assessment Tool Used based on clinical assessment of strength, gait and balance    Functional Limitation Mobility: Walking and moving around   Mobility: Walking and Moving Around Current Status [phone]) At least 60 percent but less than 80 percent impaired, limited or restricted   Mobility: Walking and Moving Around Goal Status [phone]) At least 40 percent but less than 60 percent impaired, limited or restricted      Problem List Patient Active Problem List   Diagnosis Date Noted  . Cauda equina spinal cord injury ([place]) 10/26/2015  . Infection of urinary tract 10/26/2015  . Intractable back pain 10/22/2015  . Back pain 10/22/2015  . Muscle weakness (generalized) 12/05/2013  . Tight fascia 12/05/2013  . Decreased range of motion of right shoulder 12/05/2013  . Decreased range of motion of left shoulder 12/05/2013  . Bilateral leg weakness 11/21/2013  . Poor balance 11/21/2013  . Difficulty in walking(719.7) 11/21/2013  . Shoulder pain, left 09/12/2013  . Anemia, iron deficiency 09/09/2013  . Dyspnea 09/07/2013  . UTI (urinary tract infection) 09/07/2013  . Acute CHF (congestive heart failure) ([place]) 09/07/2013  . Elevated troponin 09/07/2013  . Fever and chills 09/06/2013  . Altered mental status 09/05/2013  . Neurogenic bowel 08/22/2013  . Cauda equina syndrome with neurogenic bladder ([place]) 08/22/2013  . Gout flare 08/22/2013  . Postoperative wound infection 08/22/2013  . Lumbar degenerative disc disease  08/02/2013  . Post-operative pain 07/29/2013  . Osteonecrosis ([place]) 05/05/2013  . Small bowel obstruction ([place]) 04/17/2013  . Acute renal failure ([place]) 04/17/2013  . Medial meniscus, posterior horn derangement 02/17/2013  . Knee sprain and strain 02/17/2013  . Trigger point with neck pain 03/18/2012  . Rotator cuff tear arthropathy of right shoulder 09/08/2011  . CAD in native artery 01/09/2011  . Ankle fracture 12/25/2010  . Contusion of shoulder, left 12/25/2010  . PEMPHIGUS VULGARIS 07/02/2010  . MRSA 05/13/2010  . PRURITUS 05/13/2010  . SPINAL STENOSIS, LUMBAR 05/13/2010  . HLD (hyperlipidemia) 04/24/2010  . GASTROESOPHAGEAL REFLUX DISEASE 04/24/2010  . VENTRAL HERNIA, INCISIONAL 04/24/2010  . DEGENERATIVE JOINT DISEASE 04/24/2010  . PYOGENIC ARTHRITIS, SHOULDER REGION 02/04/2010  . Essential hypertension 01/01/2010  . RUPTURE ROTATOR CUFF 02/19/2009   Rayetta Humphrey, PT CLT [phone] 03/19/2016, 11:40 AM  [facility]  [place], [place], [address] Phone: [phone]   Fax:  [phone]  Name: DAHLTON HINDE MRN: [id number] Date of Birth: [birth year]

## 2016-03-10 ENCOUNTER — Ambulatory Visit (HOSPITAL_COMMUNITY): Payer: Medicare Other | Admitting: Physical Therapy

## 2016-03-10 DIAGNOSIS — R2681 Unsteadiness on feet: Secondary | ICD-10-CM

## 2016-03-10 DIAGNOSIS — R262 Difficulty in walking, not elsewhere classified: Secondary | ICD-10-CM

## 2016-03-10 DIAGNOSIS — M6281 Muscle weakness (generalized): Secondary | ICD-10-CM | POA: Diagnosis not present

## 2016-03-10 NOTE — Patient Instructions (Addendum)
Heel Squeeze (Prone)    Abdomen supported, bend knees and gently squeeze heels together. Hold _3-5___ seconds. Repeat _10___ times per set. Do ___1_ sets per session. Do __10__ sessions per day.  http://orth.exer.us/1080   Copyright  VHI. All rights reserved.  Gluteal Sets    Tighten buttocks while pressing pelvis to floor. Hold __3-5__ seconds. Repeat10 ____ times per set. Do __1__ sets per session. Do _2___ sessions per day.  http://orth.exer.us/104   Copyright  VHI. All rights reserved.  Bridging    Slowly raise buttocks from floor, keeping stomach tight. Repeat 15____ times per set. Do ___1_ sets per session. Do __2__ sessions per day.  http://orth.exer.us/1096   Copyright  VHI. All rights reserved.  Combination (Hook-Lying)    Tighten stomach and slowly raise left leg and lower opposite arm over head. Keep trunk rigid. Repeat _15___ times per set. Do __1__ sets per session. Do _2___ sessions per day.  http://orth.exer.us/1092   Copyright  VHI. All rights reserved.

## 2016-03-10 NOTE — Therapy (Signed)
[place], [place], [address] Phone: [phone]   Fax:  [phone]  Physical Therapy Treatment  Patient Details  Name: [name] MRN: [id number] Date of Birth: Sep 16, 1940 Referring Provider: Sinda Du   Encounter Date: 03/10/2016      PT End of Session - 03/10/16 0940    Visit Number 11   Number of Visits 20   Date for PT Re-Evaluation 02/27/16   Authorization Type Medicare/[employer]    Authorization Time Period 02/06/16 to 03/19/16   g code done on visit 10    Authorization - Visit Number 11   Authorization - Number of Visits 20   PT Start Time 0905   PT Stop Time 0945   PT Time Calculation (min) 40 min   Activity Tolerance Patient tolerated treatment well   Behavior During Therapy [facility] for tasks assessed/performed      Past Medical History:  Diagnosis Date  . Acute renal failure ([place]) 04/17/2013  . Anemia, iron deficiency 09/09/2013  . Anxiety   . Arthritis   . 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 Coronary artery disease    IN 2000   STENT PLACED IN 2012 sees Dr. Lattie Haw, saw last 2013  . Disorder of blood    BEEN TREATED BY DERMATOLOGIST X 4 YRS..."BLOOD BLISTERS"  . Gout   . HTN (hypertension)    sees Dr. [name] in [place]  . Hx MRSA infection    rt shoulder  . Hyperlipidemia   . Pneumonia    "I've had it 3-4 times"  . Small bowel obstruction ([place])   . Small bowel problem    HAD ALOT OF SCAR TISSUE FROM PREVIOUS SURGERIES..NG WAS INSERTED ...Marland KitchenMarland KitchenNO SURGERY NEEDED...Marland KitchenMarland KitchenIN FOR 8 DAYS  . Ventral hernia     Past Surgical History:  Procedure Laterality Date  . ANTERIOR CERVICAL CORPECTOMY  12/17/11  . ANTERIOR CERVICAL CORPECTOMY  12/17/2011   Procedure: ANTERIOR CERVICAL CORPECTOMY;  Surgeon: Floyce Stakes, MD;  Location: [place] [facility];  Service: Neurosurgery;  Laterality: N/A;  Anterior Cervical Decompression Fusion Five to Thoracic Two with plating   . BACK SURGERY     lumbar  . CATARACT EXTRACTION W/ INTRAOCULAR LENS  IMPLANT, BILATERAL  ? 2011  . CHOLECYSTECTOMY  2006 "or after"  . CORONARY ANGIOPLASTY WITH STENT PLACEMENT  2012  . CORONARY ARTERY BYPASS GRAFT  2000   CABG X5  . EYE SURGERY     bilateral cataract  . INCISION AND DRAINAGE OF WOUND  ~ 20ll; 12/03/11   "had infection in my right"  . LUMBAR LAMINECTOMY/DECOMPRESSION MICRODISCECTOMY  06/13/2011   Procedure: LUMBAR LAMINECTOMY/DECOMPRESSION MICRODISCECTOMY;  Surgeon: Floyce Stakes;  Location: [place] [facility];  Service: Neurosurgery;  Laterality: N/A;  Lumbar three, lumbar four-five Laminectomy  . LUMBAR LAMINECTOMY/DECOMPRESSION MICRODISCECTOMY Right 06/17/2013   Procedure: LUMBAR LAMINECTOMY/DECOMPRESSION MICRODISCECTOMY 1 LEVEL;  Surgeon: Floyce Stakes, MD;  Location: [place] [facility]S;  Service: Neurosurgery;  Laterality: Right;  Right L3-4 Microdiskectomy  . LUMBAR WOUND DEBRIDEMENT N/A 08/02/2013   Procedure: INCISION AND DRAINAGE OF LUMBAR WOUND DEBRIDEMENT;  Surgeon: Floyce Stakes, MD;  Location: [facility];  Service: Neurosurgery;  Laterality: N/A;  . [place] CATHETER INSERTION  2011 & 11/2011  . SHOULDER ARTHROSCOPY Left 09/13/2013   Procedure: ARTHROSCOPIC IRRIGATION AND DEBRIDEMENT, SYNOVECTOMY ,  LEFT SHOULDER ;  Surgeon: Yvette Rack., MD;  Location: [place];  Service: Orthopedics;  Laterality: Left;  . SHOULDER OPEN ROTATOR CUFF REPAIR  ~ 2011   right  . STERNAL SURG  2000   HAS HAD 5-6 ON HIS STERNUM; "caught MRSA in it"  . TONSILLECTOMY  1949    There were no vitals filed for this visit.      Subjective Assessment - 03/10/16 0909    Subjective Pt states that is in no pain today; states he has been doing his exercises at least once a day    Pertinent History beta blockers, CHF/CAD, HTN, history of lumbar laminectomy, CABG, history of ventral hernia, history of AVN B hips, history of cervical corpectomy, brace secondary to no  sternum   Currently in Pain? No/denies   Pain Onset In the past 7 days                         [facility] Adult PT Treatment/Exercise - 03/10/16 0001      Ambulation/Gait   Gait Comments use of KAFO throughout gait as patient''s L LE buckles without; reduced heel-toe pattern and gait speed, flexed at hips, excessive use of UEs on walker      Exercises   Exercises Lumbar;Knee/Hip     Lumbar Exercises: Stretches   Passive Hamstring Stretch 3 reps;30 seconds   Prone Mid Back Stretch 3 reps;30 seconds     Lumbar Exercises: Supine   Bridge 10 reps   Other Supine Lumbar Exercises dead bug x 10     Lumbar Exercises: Sidelying   Hip Abduction 10 reps     Lumbar Exercises: Prone   Straight Leg Raise 10 reps  knee flexed to 90 degrees; AROM Rt; AAROM LT    Other Prone Lumbar Exercises bilateral knee flexion x 10; Rt AROM; LT AA    Other Prone Lumbar Exercises heel squeeze,glut sets x 15      Lumbar Exercises: Quadruped   [place] 5 reps   Other Quadruped Lumbar Exercises quadriped unweight UE x 10, crawling x 4 reps    Other Quadruped Lumbar Exercises --     Knee/Hip Exercises: Stretches   Other Knee/Hip Stretches child pose x 4 x 20"     Knee/Hip Exercises: Seated   Sit to [employer] 10 reps     Knee/Hip Exercises: Sidelying   Hip ABduction Strengthening;Both;10 reps   Hip ABduction Limitations AA on LT      Knee/Hip Exercises: Prone   Hamstring Curl 10 reps   Hamstring Curl Limitations Lt AA   Hip Extension Both;10 reps   Hip Extension Limitations knee bent at 90 degrees                   PT Short Term Goals - 03/10/16 7793      PT SHORT TERM GOAL #1   Title Patient to be able to ambulate at least 465f with rollator and KAFO in order to demonstrate improvement in mobilty    Time 3   Period Weeks   Status On-going     PT SHORT TERM GOAL #2   Title Patient to be able to tolerate CKC exercise with KAFO unlocked and minimal  unsteadiness/instability in order to demonstrate improvement in condition    Time 3   Period Weeks   Status Partially Met     PT SHORT TERM GOAL #3   Title Patient to be able to statically stand with KAFO locked and no UE support in order to demonstrate improved balance  Time 3   Period Weeks   Status On-going     PT SHORT TERM GOAL #4   Title Patient will  consistetly perform appropriate HEP correctly, to be updated PRN    Time 3   Period Weeks   Status Achieved           PT Long Term Goals - 03/10/16 2778      PT LONG TERM GOAL #1   Title Patient will demonstrate L LE strength at least 4-/5 and 5/5 muslce strength in all other tested groups in order to assist in improving gait and stablity    Time 6   Period Weeks   Status On-going     PT LONG TERM GOAL #2   Title Patient to be able to ambulate and perform functional mobility with AFO and rollator rather than KAFO in order to demonstrate improved strength and mobiltiy    Time 6   Period Weeks   Status On-going     PT LONG TERM GOAL #3   Title Patient to be able to complete TUG balance test in 18 seconds or less wtih rollator in order to demonstrate improved mobility and reduced fall risk    Baseline 8/10: 26 seconds ( significant time to unlock and lock brace   Time 6   Period Weeks   Status On-going     PT LONG TERM GOAL #4   Title Patient to be able to ambulate at least 634f with  rollator during 3MWT in order to demonstrate improved overall mobilty    Time 6   Period Weeks   Status On-going               Plan - 03/10/16 0941    Clinical Impression Statement Added prone glut sets and heel squeezes to pt routine to improve muscular strength and stabiltiy.  Pt home exercise program was updated.  Pt continues to need skilled physcial therapy to improve stability and safety of gait.    Rehab Potential Good   PT Frequency 2x / week   PT Duration --  increase from 6 to 10 weeks.    PT  Treatment/Interventions ADLs/Self Care Home Management;Electrical Stimulation;Gait training;Stair training;Functional mobility training;Therapeutic activities;Therapeutic exercise;Balance training;Neuromuscular re-education;Patient/family education;Manual techniques;Energy conservation;Taping   PT Next Visit Plan Progress functional strengthening, balance and gait training with CKC and [place].   Consulted and Agree with Plan of Care Patient      Patient will benefit from skilled therapeutic intervention in order to improve the following deficits and impairments:  Abnormal gait, Decreased activity tolerance, Decreased strength, Decreased balance, Decreased mobility, Difficulty walking, Improper body mechanics, Decreased coordination, Impaired flexibility, Postural dysfunction  Visit Diagnosis: Difficulty in walking, not elsewhere classified  Muscle weakness (generalized)  Unsteadiness on feet     Problem List Patient Active Problem List   Diagnosis Date Noted  . 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 Infection of urinary tract 10/26/2015  . Intractable back pain 10/22/2015  . Back pain 10/22/2015  . Muscle weakness (generalized) 12/05/2013  . Tight fascia 12/05/2013  . Decreased range of motion of right shoulder 12/05/2013  . Decreased range of motion of left shoulder 12/05/2013  . Bilateral leg weakness 11/21/2013  . Poor balance 11/21/2013  . Difficulty in walking(719.7) 11/21/2013  . Shoulder pain, left 09/12/2013  . Anemia, iron deficiency 09/09/2013  . Dyspnea 09/07/2013  . UTI (urinary tract infection) 09/07/2013  . Acute CHF (congestive heart failure) ([place] 09/07/2013  . Elevated  troponin 09/07/2013  . Fever and chills 09/06/2013  . Altered mental status 09/05/2013  . Neurogenic bowel 08/22/2013  . Cauda equina syndrome with neurogenic bladder ([place]) 08/22/2013  . Gout flare 08/22/2013  . Postoperative wound infection 08/22/2013  . Lumbar degenerative disc  disease 08/02/2013  . Post-operative pain 07/29/2013  . Osteonecrosis ([place]) 05/05/2013  . Small bowel obstruction ([place]) 04/17/2013  . Acute renal failure ([place]) 04/17/2013  . Medial meniscus, posterior horn derangement 02/17/2013  . Knee sprain and strain 02/17/2013  . Trigger point with neck pain 03/18/2012  . Rotator cuff tear arthropathy of right shoulder 09/08/2011  . CAD in native artery 01/09/2011  . Ankle fracture 12/25/2010  . Contusion of shoulder, left 12/25/2010  . PEMPHIGUS VULGARIS 07/02/2010  . MRSA 05/13/2010  . PRURITUS 05/13/2010  . SPINAL STENOSIS, LUMBAR 05/13/2010  . HLD (hyperlipidemia) 04/24/2010  . GASTROESOPHAGEAL REFLUX DISEASE 04/24/2010  . VENTRAL HERNIA, INCISIONAL 04/24/2010  . DEGENERATIVE JOINT DISEASE 04/24/2010  . PYOGENIC ARTHRITIS, SHOULDER REGION 02/04/2010  . Essential hypertension 01/01/2010  . RUPTURE ROTATOR CUFF 02/19/2009    Rayetta Humphrey, PT CLT [phone] 03/10/2016, 9:48 AM  [place] [address] [place], [place], [address] Phone: [phone]   Fax:  [phone]  Name: [name] MRN: [id number] Date of Birth: [birth year]

## 2016-03-13 ENCOUNTER — Telehealth (HOSPITAL_COMMUNITY): Payer: Self-pay

## 2016-03-13 ENCOUNTER — Ambulatory Visit (HOSPITAL_COMMUNITY): Payer: Medicare Other

## 2016-03-13 NOTE — Telephone Encounter (Signed)
Left a message that he was in a lot of pain

## 2016-03-17 ENCOUNTER — Ambulatory Visit (HOSPITAL_COMMUNITY): Payer: Medicare Other | Admitting: Physical Therapy

## 2016-03-17 DIAGNOSIS — R262 Difficulty in walking, not elsewhere classified: Secondary | ICD-10-CM | POA: Diagnosis not present

## 2016-03-17 DIAGNOSIS — R2681 Unsteadiness on feet: Secondary | ICD-10-CM

## 2016-03-17 DIAGNOSIS — M6281 Muscle weakness (generalized): Secondary | ICD-10-CM

## 2016-03-17 NOTE — Therapy (Signed)
[facility] [address] [place], [place], [address] Phone: [phone]   Fax:  [phone]  Physical Therapy Treatment  Patient Details  Name: [name] MRN: [id number] Date of Birth: [birth year] Referring Provider: [name]   Encounter Date: 03/17/2016      PT End of Session - 03/17/16 0946    Visit Number 12   Number of Visits 20   Date for PT Re-Evaluation 04/03/16   Authorization Type Medicare/[employer]    Authorization Time Period 02/06/16 to 03/19/16   g code done on visit 10    Authorization - Visit Number 12   Authorization - Number of Visits 20   PT Start Time 0902   PT Stop Time 0938  ended on Nustep, not billed    PT Time Calculation (min) 36 min   Activity Tolerance Patient tolerated treatment well   Behavior During Therapy [facility] for tasks assessed/performed      Past Medical History:  Diagnosis Date  . Acute renal failure (HCC) 04/17/2013  . Anemia, iron deficiency 09/09/2013  . Anxiety   . Arthritis   . 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 Coronary artery disease    IN 2000   STENT PLACED IN 2012 sees Dr. Dietrich Pates, saw last 2013  . Disorder of blood    BEEN TREATED BY DERMATOLOGIST X 4 YRS..."BLOOD BLISTERS"  . Gout   . HTN (hypertension)    sees [name] in [place]  . Hx MRSA infection    rt shoulder  . Hyperlipidemia   . Pneumonia    "I've had it 3-4 times"  . Small bowel obstruction (HCC)   . Small bowel problem    HAD ALOT OF SCAR TISSUE FROM PREVIOUS SURGERIES..NG WAS INSERTED ...Marland KitchenMarland KitchenNO SURGERY NEEDED...Marland KitchenMarland KitchenIN FOR 8 DAYS  . Ventral hernia     Past Surgical History:  Procedure Laterality Date  . ANTERIOR CERVICAL CORPECTOMY  12/17/11  . ANTERIOR CERVICAL CORPECTOMY  12/17/2011   Procedure: ANTERIOR CERVICAL CORPECTOMY;  Surgeon: Karn Cassis, MD;  Location: [facility];  Service: Neurosurgery;  Laterality: N/A;  Anterior Cervical Decompression Fusion Five  to Thoracic Two with plating  . BACK SURGERY     lumbar  . CATARACT EXTRACTION W/ INTRAOCULAR LENS  IMPLANT, BILATERAL  ? 2011  . CHOLECYSTECTOMY  2006 "or after"  . CORONARY ANGIOPLASTY WITH STENT PLACEMENT  2012  . CORONARY ARTERY BYPASS GRAFT  2000   CABG X5  . EYE SURGERY     bilateral cataract  . INCISION AND DRAINAGE OF WOUND  ~ 20ll; 12/03/11   "had infection in my right"  . LUMBAR LAMINECTOMY/DECOMPRESSION MICRODISCECTOMY  06/13/2011   Procedure: LUMBAR LAMINECTOMY/DECOMPRESSION MICRODISCECTOMY;  Surgeon: Karn Cassis;  Location: [facility];  Service: Neurosurgery;  Laterality: N/A;  Lumbar three, lumbar four-five Laminectomy  . LUMBAR LAMINECTOMY/DECOMPRESSION MICRODISCECTOMY Right 06/17/2013   Procedure: LUMBAR LAMINECTOMY/DECOMPRESSION MICRODISCECTOMY 1 LEVEL;  Surgeon: Karn Cassis, MD;  Location: [facility];  Service: Neurosurgery;  Laterality: Right;  Right L3-4 Microdiskectomy  . LUMBAR WOUND DEBRIDEMENT N/A 08/02/2013   Procedure: INCISION AND DRAINAGE OF LUMBAR WOUND DEBRIDEMENT;  Surgeon: Karn Cassis, MD;  Location: [facility];  Service: Neurosurgery;  Laterality: N/A;  . PERIPHERALLY INSERTED CENTRAL CATHETER INSERTION  2011 & 11/2011  . SHOULDER ARTHROSCOPY Left 09/13/2013   Procedure: ARTHROSCOPIC IRRIGATION AND DEBRIDEMENT, SYNOVECTOMY ,  LEFT SHOULDER ;  Surgeon: Dyke Brackett  Brooke Bonito., MD;  Location: [place];  Service: Orthopedics;  Laterality: Left;  . SHOULDER OPEN ROTATOR CUFF REPAIR  ~ 2011   right  . STERNAL SURG  2000   HAS HAD 5-6 ON HIS STERNUM; "caught MRSA in it"  . TONSILLECTOMY  1949    There were no vitals filed for this visit.      Subjective Assessment - 03/17/16 0905    Subjective Patient arrives stating that he is sore, mostly from being in quadruped as this puts a lot of pressure on his shoulders and arms and he is still feeling the effects from last session. No falls or close calls recently.    Pertinent History beta blockers, CHF/CAD,  HTN, history of lumbar laminectomy, CABG, history of ventral hernia, history of AVN B hips, history of cervical corpectomy, brace secondary to no sternum   Currently in Pain? Yes   Pain Score 7    Pain Location Other (Comment)  patient, not specific, states "its sharp pains, its hard to explain the location"                          [facility] Adult PT Treatment/Exercise - 03/17/16 0001      Lumbar Exercises: Aerobic   Stationary Bike Nustep x6 minutes level 2 hills 0, distant supervision   not included in billing      Lumbar Exercises: Seated   Long Arc Quad on Chair Strengthening;Both;2 sets;10 reps   LAQ on Chair Weights (lbs) --  1# L LE, 2# R LE    Sit to Stand 10 reps   Sit to Stand Limitations B UEs no brace, min guard; hyperextension noted L knee      Lumbar Exercises: Supine   Ab Set 15 reps   AB Set Limitations 2 second holds    Heel Slides 15 reps   Bridge 10 reps   Other Supine Lumbar Exercises supine hip ABD with red TB 1x10     Lumbar Exercises: Sidelying   Clam 10 reps   Clam Limitations red TB      Lumbar Exercises: Prone   Straight Leg Raise 10 reps  mild trunk rotation compensation noted due to weakness      Knee/Hip Exercises: Seated   Hamstring Curl Both;Strengthening;1 set;10 reps   Hamstring Limitations 1# L LE, 1# R LE                 PT Education - 03/17/16 0944    Education provided Yes   Education Details DOMS and ways to manage it    Person(s) Educated Patient   Methods Explanation   Comprehension Verbalized understanding          PT Short Term Goals - 03/10/16 0942      PT SHORT TERM GOAL #1   Title Patient to be able to ambulate at least 427f with rollator and KAFO in order to demonstrate improvement in mobilty    Time 3   Period Weeks   Status On-going     PT SHORT TERM GOAL #2   Title Patient to be able to tolerate CKC exercise with KAFO unlocked and minimal unsteadiness/instability in order to  demonstrate improvement in condition    Time 3   Period Weeks   Status Partially Met     PT SHORT TERM GOAL #3   Title Patient to be able to statically stand with KAFO locked and no UE support in order to demonstrate  improved balance    Time 3   Period Weeks   Status On-going     PT SHORT TERM GOAL #4   Title Patient will  consistetly perform appropriate HEP correctly, to be updated PRN    Time 3   Period Weeks   Status Achieved           PT Long Term Goals - 03/10/16 6026      PT LONG TERM GOAL #1   Title Patient will demonstrate L LE strength at least 4-/5 and 5/5 muslce strength in all other tested groups in order to assist in improving gait and stablity    Time 6   Period Weeks   Status On-going     PT LONG TERM GOAL #2   Title Patient to be able to ambulate and perform functional mobility with AFO and rollator rather than KAFO in order to demonstrate improved strength and mobiltiy    Time 6   Period Weeks   Status On-going     PT LONG TERM GOAL #3   Title Patient to be able to complete TUG balance test in 18 seconds or less wtih rollator in order to demonstrate improved mobility and reduced fall risk    Baseline 8/10: 26 seconds ( significant time to unlock and lock brace   Time 6   Period Weeks   Status On-going     PT LONG TERM GOAL #4   Title Patient to be able to ambulate at least 641ft with  rollator during in order to demonstrate improved overall mobilty    Time 6   Period Weeks   Status On-going               Plan - 03/17/16 0946    Clinical Impression Statement Patient arrives today stating he is having shoulder pain he believes from being on his hands so much in quadruped position; adjusted session for reduced focus on quadruped activities today due to pain. In general performed functional exercises on mat table and standing at side of mat table today for proximal and LE strengthening with occasional cues and encouragement provided by PT.  Trialed sit to stand without brace today and min guard/blocking of knee, noting hyperextension due to weakness and required cues for form for increased weight bearing L LE. Ended session on Nustep for further LE strengthening, not included in billing.    Rehab Potential Good   PT Frequency 2x / week   PT Treatment/Interventions ADLs/Self Care Home Management;Electrical Stimulation;Gait training;Stair training;Functional mobility training;Therapeutic activities;Therapeutic exercise;Balance training;Neuromuscular re-education;Patient/family education;Manual techniques;Energy conservation;Taping   PT Next Visit Plan continue functional strength, try to progress out of brace for CKC strength; continue Nustep    Consulted and Agree with Plan of Care Patient      Patient will benefit from skilled therapeutic intervention in order to improve the following deficits and impairments:  Abnormal gait, Decreased activity tolerance, Decreased strength, Decreased balance, Decreased mobility, Difficulty walking, Improper body mechanics, Decreased coordination, Impaired flexibility, Postural dysfunction  Visit Diagnosis: Difficulty in walking, not elsewhere classified  Muscle weakness (generalized)  Unsteadiness on feet     Problem List Patient Active Problem List   Diagnosis Date Noted  . 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 Infection of urinary tract 10/26/2015  . Intractable back pain 10/22/2015  . Back pain 10/22/2015  . Muscle weakness (generalized) 12/05/2013  . Tight fascia 12/05/2013  . Decreased range of motion of right shoulder 12/05/2013  .  Decreased range of motion of left shoulder 12/05/2013  . Bilateral leg weakness 11/21/2013  . Poor balance 11/21/2013  . Difficulty in walking(719.7) 11/21/2013  . Shoulder pain, left 09/12/2013  . Anemia, iron deficiency 09/09/2013  . Dyspnea 09/07/2013  . UTI (urinary tract infection) 09/07/2013  . Acute CHF (congestive heart  failure) ([place]) 09/07/2013  . Elevated troponin 09/07/2013  . Fever and chills 09/06/2013  . Altered mental status 09/05/2013  . Neurogenic bowel 08/22/2013  . Cauda equina syndrome with neurogenic bladder ([place]) 08/22/2013  . Gout flare 08/22/2013  . Postoperative wound infection 08/22/2013  . Lumbar degenerative disc disease 08/02/2013  . Post-operative pain 07/29/2013  . Osteonecrosis ([place]) 05/05/2013  . Small bowel obstruction ([place]) 04/17/2013  . Acute renal failure ([place]) 04/17/2013  . Medial meniscus, posterior horn derangement 02/17/2013  . Knee sprain and strain 02/17/2013  . Trigger point with neck pain 03/18/2012  . Rotator cuff tear arthropathy of right shoulder 09/08/2011  . CAD in native artery 01/09/2011  . Ankle fracture 12/25/2010  . Contusion of shoulder, left 12/25/2010  . PEMPHIGUS VULGARIS 07/02/2010  . MRSA 05/13/2010  . PRURITUS 05/13/2010  . SPINAL STENOSIS, LUMBAR 05/13/2010  . HLD (hyperlipidemia) 04/24/2010  . GASTROESOPHAGEAL REFLUX DISEASE 04/24/2010  . VENTRAL HERNIA, INCISIONAL 04/24/2010  . DEGENERATIVE JOINT DISEASE 04/24/2010  . PYOGENIC ARTHRITIS, SHOULDER REGION 02/04/2010  . Essential hypertension 01/01/2010  . RUPTURE ROTATOR CUFF 02/19/2009    Deniece Ree PT, DPT [facility] [address] [place], [place], [address] Phone: [phone]   Fax:  [phone]  Name: DUWAN ADRIAN MRN: [id number] Date of Birth: [birth year]

## 2016-03-19 ENCOUNTER — Other Ambulatory Visit: Payer: Self-pay | Admitting: Internal Medicine

## 2016-03-20 ENCOUNTER — Ambulatory Visit (HOSPITAL_COMMUNITY): Payer: Medicare Other

## 2016-03-20 DIAGNOSIS — R262 Difficulty in walking, not elsewhere classified: Secondary | ICD-10-CM

## 2016-03-20 DIAGNOSIS — R2681 Unsteadiness on feet: Secondary | ICD-10-CM | POA: Diagnosis not present

## 2016-03-20 DIAGNOSIS — M6281 Muscle weakness (generalized): Secondary | ICD-10-CM

## 2016-03-20 NOTE — Therapy (Signed)
[place], [place], [address] Phone: [phone]   Fax:  [phone]  Physical Therapy Treatment  Patient Details  Name: [name] MRN: [id number] Date of Birth: [birth year] Referring Provider: Sinda Du   Encounter Date: 03/20/2016      PT End of Session - 03/20/16 0909    Visit Number 13   Number of Visits 20   Date for PT Re-Evaluation 04/03/16   Authorization Type Medicare/[employer]    Authorization Time Period 02/06/16 to 03/19/16   g code done on visit 10    Authorization - Visit Number 13   Authorization - Number of Visits 20   PT Start Time 0902   PT Stop Time 0943   PT Time Calculation (min) 41 min   Equipment Utilized During Treatment Gait belt   Activity Tolerance Patient tolerated treatment well   Behavior During Therapy [facility] for tasks assessed/performed      Past Medical History:  Diagnosis Date  . Acute renal failure ([place]) 04/17/2013  . Anemia, iron deficiency 09/09/2013  . Anxiety   . Arthritis   . 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 Coronary artery disease    IN 2000   STENT PLACED IN 2012 sees Dr. Lattie Haw, saw last 2013  . Disorder of blood    BEEN TREATED BY DERMATOLOGIST X 4 YRS..."BLOOD BLISTERS"  . Gout   . HTN (hypertension)    sees Dr. [name] in [place]  . Hx MRSA infection    rt shoulder  . Hyperlipidemia   . Pneumonia    "I've had it 3-4 times"  . Small bowel obstruction ([place])   . Small bowel problem    HAD ALOT OF SCAR TISSUE FROM PREVIOUS SURGERIES..NG WAS INSERTED ...Marland KitchenMarland KitchenNO SURGERY NEEDED...Marland KitchenMarland KitchenIN FOR 8 DAYS  . Ventral hernia     Past Surgical History:  Procedure Laterality Date  . ANTERIOR CERVICAL CORPECTOMY  12/17/11  . ANTERIOR CERVICAL CORPECTOMY  12/17/2011   Procedure: ANTERIOR CERVICAL CORPECTOMY;  Surgeon: Floyce Stakes, MD;  Location: [facility];  Service: Neurosurgery;  Laterality: N/A;  Anterior Cervical  Decompression Fusion Five to Thoracic Two with plating  . BACK SURGERY     lumbar  . CATARACT EXTRACTION W/ INTRAOCULAR LENS  IMPLANT, BILATERAL  ? 2011  . CHOLECYSTECTOMY  2006 "or after"  . CORONARY ANGIOPLASTY WITH STENT PLACEMENT  2012  . CORONARY ARTERY BYPASS GRAFT  2000   CABG X5  . EYE SURGERY     bilateral cataract  . INCISION AND DRAINAGE OF WOUND  ~ 20ll; 12/03/11   "had infection in my right"  . LUMBAR LAMINECTOMY/DECOMPRESSION MICRODISCECTOMY  06/13/2011   Procedure: LUMBAR LAMINECTOMY/DECOMPRESSION MICRODISCECTOMY;  Surgeon: Floyce Stakes;  Location: [place] [facility];  Service: Neurosurgery;  Laterality: N/A;  Lumbar three, lumbar four-five Laminectomy  . LUMBAR LAMINECTOMY/DECOMPRESSION MICRODISCECTOMY Right 06/17/2013   Procedure: LUMBAR LAMINECTOMY/DECOMPRESSION MICRODISCECTOMY 1 LEVEL;  Surgeon: Floyce Stakes, MD;  Location: [facility];  Service: Neurosurgery;  Laterality: Right;  Right L3-4 Microdiskectomy  . LUMBAR WOUND DEBRIDEMENT N/A 08/02/2013   Procedure: INCISION AND DRAINAGE OF LUMBAR WOUND DEBRIDEMENT;  Surgeon: Floyce Stakes, MD;  Location: [facility];  Service: Neurosurgery;  Laterality: N/A;  . [place] CATHETER INSERTION  2011 & 11/2011  . SHOULDER ARTHROSCOPY Left 09/13/2013   Procedure: ARTHROSCOPIC IRRIGATION AND DEBRIDEMENT, SYNOVECTOMY ,  LEFT SHOULDER ;  Surgeon: Patricia Nettle  Valeta Harms., MD;  Location: [facility];  Service: Orthopedics;  Laterality: Left;  . SHOULDER OPEN ROTATOR CUFF REPAIR  ~ 2011   right  . STERNAL SURG  2000   HAS HAD 5-6 ON HIS STERNUM; "caught MRSA in it"  . TONSILLECTOMY  1949    There were no vitals filed for this visit.      Subjective Assessment - 03/20/16 0905    Subjective Pt stated ha has a little pain in lower back, 4/10 for Lt LBP pain scale.  Pt stated he wishes not to put a lot of pressure on his shoulder due to increased pain following this session.  No reports of recent falls.     Pertinent History  beta blockers, CHF/CAD, HTN, history of lumbar laminectomy, CABG, history of ventral hernia, history of AVN B hips, history of cervical corpectomy, brace secondary to no sternum   Patient Stated Goals get leg working better, get walking    Currently in Pain? Yes   Pain Score 4    Pain Location Back   Pain Orientation Lower;Left   Pain Descriptors / Indicators Sore   Pain Type Chronic pain   Pain Radiating Towards no radiation   Pain Onset More than a month ago   Pain Frequency Intermittent   Aggravating Factors  working out at the gym   Pain Relieving Factors stretching, pain med   Effect of Pain on Daily Activities increase                         [facility] Adult PT Treatment/Exercise - 03/20/16 0001      Ambulation/Gait   Ambulation/Gait --   Ambulation/Gait Assistance --   Ambulation Distance (Feet) --     Lumbar Exercises: Aerobic   Stationary Bike Nustep unavailable, resume next session     Lumbar Exercises: Supine   Bridge --   Other Supine Lumbar Exercises dead bug x 10     Lumbar Exercises: Sidelying   Hip Abduction 10 reps     Lumbar Exercises: Quadruped   Other Quadruped Lumbar Exercises requested hold due to increased shoulder pain in quadruped posiition     Knee/Hip Exercises: Seated   Sit to Sand 10 reps  elevated surface     Knee/Hip Exercises: Supine   Bridges 15 reps   Other Supine Knee/Hip Exercises dead bug x 10 with AAROM end range Lt shoulder flexion     Knee/Hip Exercises: Sidelying   Hip ABduction Both;2 sets;10 reps   Hip ABduction Limitations AA on LT      Knee/Hip Exercises: Prone   Hamstring Curl 2 sets;10 reps   Hamstring Curl Limitations Lt AA   Hip Extension 2 sets;10 reps   Hip Extension Limitations knee bent at 90 degrees, AA with Lt LE             Balance Exercises - 03/20/16 0944      Balance Exercises: Standing   Sidestepping 1 rep  front of mat with therapist for UE support cueing for postur              PT Short Term Goals - 03/10/16 0942      PT SHORT TERM GOAL #1   Title Patient to be able to ambulate at least 480f with rollator and KAFO in order to demonstrate improvement in mobilty    Time 3   Period Weeks   Status On-going     PT SHORT TERM GOAL #2  Title Patient to be able to tolerate CKC exercise with KAFO unlocked and minimal unsteadiness/instability in order to demonstrate improvement in condition    Time 3   Period Weeks   Status Partially Met     PT SHORT TERM GOAL #3   Title Patient to be able to statically stand with KAFO locked and no UE support in order to demonstrate improved balance    Time 3   Period Weeks   Status On-going     PT SHORT TERM GOAL #4   Title Patient will  consistetly perform appropriate HEP correctly, to be updated PRN    Time 3   Period Weeks   Status Achieved           PT Long Term Goals - 03/10/16 9438      PT LONG TERM GOAL #1   Title Patient will demonstrate L LE strength at least 4-/5 and 5/5 muslce strength in all other tested groups in order to assist in improving gait and stablity    Time 6   Period Weeks   Status On-going     PT LONG TERM GOAL #2   Title Patient to be able to ambulate and perform functional mobility with AFO and rollator rather than KAFO in order to demonstrate improved strength and mobiltiy    Time 6   Period Weeks   Status On-going     PT LONG TERM GOAL #3   Title Patient to be able to complete TUG balance test in 18 seconds or less wtih rollator in order to demonstrate improved mobility and reduced fall risk    Baseline 8/10: 26 seconds ( significant time to unlock and lock brace   Time 6   Period Weeks   Status On-going     PT LONG TERM GOAL #4   Title Patient to be able to ambulate at least 636ft with  rollator during in order to demonstrate improved overall mobilty    Time 6   Period Weeks   Status On-going               Plan - 03/20/16 1030    Clinical  Impression Statement Held quadruped exercises this session due to reports of increased shoulder pain.  Session focus on improving LE strengthening.  Completed all mat activities with 2 sets for strengthneing with AA required with prone exercises for hamstring and glut strengthening.  Pt continues to have difficulty with Lt LE weight bearing, increased A required with sidestepping this session.  No reports of pain through session, was limited by fatigue with activities.     Rehab Potential Good   PT Frequency 2x / week   PT Duration --  increased from 6 to 10 weeks   PT Treatment/Interventions ADLs/Self Care Home Management;Electrical Stimulation;Gait training;Stair training;Functional mobility training;Therapeutic activities;Therapeutic exercise;Balance training;Neuromuscular re-education;Patient/family education;Manual techniques;Energy conservation;Taping   PT Next Visit Plan continue functional strength, try to progress out of brace for CKC strength; continue Nustep       Patient will benefit from skilled therapeutic intervention in order to improve the following deficits and impairments:  Abnormal gait, Decreased activity tolerance, Decreased strength, Decreased balance, Decreased mobility, Difficulty walking, Improper body mechanics, Decreased coordination, Impaired flexibility, Postural dysfunction  Visit Diagnosis: Difficulty in walking, not elsewhere classified  Muscle weakness (generalized)  Unsteadiness on feet     Problem List Patient Active Problem List   Diagnosis Date Noted  . 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 Infection  of urinary tract 10/26/2015  . Intractable back pain 10/22/2015  . Back pain 10/22/2015  . Muscle weakness (generalized) 12/05/2013  . Tight fascia 12/05/2013  . Decreased range of motion of right shoulder 12/05/2013  . Decreased range of motion of left shoulder 12/05/2013  . Bilateral leg weakness 11/21/2013  . Poor balance 11/21/2013   . Difficulty in walking(719.7) 11/21/2013  . Shoulder pain, left 09/12/2013  . Anemia, iron deficiency 09/09/2013  . Dyspnea 09/07/2013  . UTI (urinary tract infection) 09/07/2013  . Acute CHF (congestive heart failure) ([place]) 09/07/2013  . Elevated troponin 09/07/2013  . Fever and chills 09/06/2013  . Altered mental status 09/05/2013  . Neurogenic bowel 08/22/2013  . Cauda equina syndrome with neurogenic bladder ([place]) 08/22/2013  . Gout flare 08/22/2013  . Postoperative wound infection 08/22/2013  . Lumbar degenerative disc disease 08/02/2013  . Post-operative pain 07/29/2013  . Osteonecrosis ([place]) 05/05/2013  . Small bowel obstruction ([place]) 04/17/2013  . Acute renal failure ([place]) 04/17/2013  . Medial meniscus, posterior horn derangement 02/17/2013  . Knee sprain and strain 02/17/2013  . Trigger point with neck pain 03/18/2012  . Rotator cuff tear arthropathy of right shoulder 09/08/2011  . CAD in native artery 01/09/2011  . Ankle fracture 12/25/2010  . Contusion of shoulder, left 12/25/2010  . PEMPHIGUS VULGARIS 07/02/2010  . MRSA 05/13/2010  . PRURITUS 05/13/2010  . SPINAL STENOSIS, LUMBAR 05/13/2010  . HLD (hyperlipidemia) 04/24/2010  . GASTROESOPHAGEAL REFLUX DISEASE 04/24/2010  . VENTRAL HERNIA, INCISIONAL 04/24/2010  . DEGENERATIVE JOINT DISEASE 04/24/2010  . PYOGENIC ARTHRITIS, SHOULDER REGION 02/04/2010  . Essential hypertension 01/01/2010  . RUPTURE ROTATOR CUFF 02/19/2009   Ihor Austin, [place]; [place]  Aldona Lento 03/20/2016, 1:01 PM  [facility] [address] [place], [place], [address] Phone: [phone]   Fax:  [phone]  Name: [name] MRN: [id number] Date of Birth: 09-15-1940

## 2016-03-27 ENCOUNTER — Ambulatory Visit (HOSPITAL_COMMUNITY): Payer: Medicare Other

## 2016-03-27 DIAGNOSIS — M6281 Muscle weakness (generalized): Secondary | ICD-10-CM

## 2016-03-27 DIAGNOSIS — R262 Difficulty in walking, not elsewhere classified: Secondary | ICD-10-CM | POA: Diagnosis not present

## 2016-03-27 DIAGNOSIS — R2681 Unsteadiness on feet: Secondary | ICD-10-CM | POA: Diagnosis not present

## 2016-03-27 NOTE — Therapy (Signed)
[place], [place], [address] Phone: [phone]   Fax:  [phone]  Physical Therapy Treatment  Patient Details  Name: [name] MRN: [id number] Date of Birth: Jul 08, 1941 Referring Provider: Sinda Du   Encounter Date: 03/27/2016      PT End of Session - 03/27/16 0907    Visit Number 14   Number of Visits 20   Date for PT Re-Evaluation 04/03/16   Authorization Type Medicare/[employer]    Authorization Time Period 02/06/16 to 03/19/16   g code done on visit 10    Authorization - Visit Number 14   Authorization - Number of Visits 20   PT Start Time 0902   PT Stop Time 0953   PT Time Calculation (min) 51 min   Equipment Utilized During Treatment Gait belt   Activity Tolerance Patient tolerated treatment well;No increased pain   Behavior During Therapy [facility] for tasks assessed/performed      Past Medical History:  Diagnosis Date  . Acute renal failure ([place]) 04/17/2013  . Anemia, iron deficiency 09/09/2013  . Anxiety   . Arthritis   . 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 Coronary artery disease    IN 2000   STENT PLACED IN 2012 sees Dr. Lattie Haw, saw last 2013  . Disorder of blood    BEEN TREATED BY DERMATOLOGIST X 4 YRS..."BLOOD BLISTERS"  . Gout   . HTN (hypertension)    sees Dr. [name] in [place]  . Hx MRSA infection    rt shoulder  . Hyperlipidemia   . Pneumonia    "I've had it 3-4 times"  . Small bowel obstruction ([place])   . Small bowel problem    HAD ALOT OF SCAR TISSUE FROM PREVIOUS SURGERIES..NG WAS INSERTED ...Marland KitchenMarland KitchenNO SURGERY NEEDED...Marland KitchenMarland KitchenIN FOR 8 DAYS  . Ventral hernia     Past Surgical History:  Procedure Laterality Date  . ANTERIOR CERVICAL CORPECTOMY  12/17/11  . ANTERIOR CERVICAL CORPECTOMY  12/17/2011   Procedure: ANTERIOR CERVICAL CORPECTOMY;  Surgeon: Floyce Stakes, MD;  Location: [facility];  Service: Neurosurgery;  Laterality: N/A;   Anterior Cervical Decompression Fusion Five to Thoracic Two with plating  . BACK SURGERY     lumbar  . CATARACT EXTRACTION W/ INTRAOCULAR LENS  IMPLANT, BILATERAL  ? 2011  . CHOLECYSTECTOMY  2006 "or after"  . CORONARY ANGIOPLASTY WITH STENT PLACEMENT  2012  . CORONARY ARTERY BYPASS GRAFT  2000   CABG X5  . EYE SURGERY     bilateral cataract  . INCISION AND DRAINAGE OF WOUND  ~ 20ll; 12/03/11   "had infection in my right"  . LUMBAR LAMINECTOMY/DECOMPRESSION MICRODISCECTOMY  06/13/2011   Procedure: LUMBAR LAMINECTOMY/DECOMPRESSION MICRODISCECTOMY;  Surgeon: Floyce Stakes;  Location: [facility];  Service: Neurosurgery;  Laterality: N/A;  Lumbar three, lumbar four-five Laminectomy  . LUMBAR LAMINECTOMY/DECOMPRESSION MICRODISCECTOMY Right 06/17/2013   Procedure: LUMBAR LAMINECTOMY/DECOMPRESSION MICRODISCECTOMY 1 LEVEL;  Surgeon: Floyce Stakes, MD;  Location: [facility];  Service: Neurosurgery;  Laterality: Right;  Right L3-4 Microdiskectomy  . LUMBAR WOUND DEBRIDEMENT N/A 08/02/2013   Procedure: INCISION AND DRAINAGE OF LUMBAR WOUND DEBRIDEMENT;  Surgeon: Floyce Stakes, MD;  Location: [facility];  Service: Neurosurgery;  Laterality: N/A;  . [place] CATHETER INSERTION  2011 & 11/2011  . SHOULDER ARTHROSCOPY Left 09/13/2013   Procedure: ARTHROSCOPIC IRRIGATION AND DEBRIDEMENT, SYNOVECTOMY ,  LEFT SHOULDER ;  Surgeon:  Yvette Rack., MD;  Location: [facility];  Service: Orthopedics;  Laterality: Left;  . SHOULDER OPEN ROTATOR CUFF REPAIR  ~ 2011   right  . STERNAL SURG  2000   HAS HAD 5-6 ON HIS STERNUM; "caught MRSA in it"  . TONSILLECTOMY  1949    There were no vitals filed for this visit.      Subjective Assessment - 03/27/16 0903    Subjective Pt stated [facility] and HEP occasionally.  Current pain scale 3/10 Lt LBP and Lt LE.   Pertinent History beta blockers, CHF/CAD, HTN, history of lumbar laminectomy, CABG, history of  ventral hernia, history of AVN B hips, history of cervical corpectomy, brace secondary to no sternum   Patient Stated Goals get leg working better, get walking    Currently in Pain? Yes   Pain Score 3    Pain Location Back   Pain Orientation Lower;Left   Pain Descriptors / Indicators --  "Grabbing"   Pain Type Chronic pain   Pain Radiating Towards no radiation   Pain Onset More than a month ago   Pain Frequency Intermittent   Aggravating Factors  working out at the gym   Pain Relieving Factors stretching, paid med   Effect of Pain on Daily Activities increase                         [facility] Adult PT Treatment/Exercise - 03/27/16 0001      Lumbar Exercises: Stretches   Prone Mid Back Stretch 3 reps;30 seconds     Lumbar Exercises: Aerobic   Stationary Bike Nustep EOS no charge x 8 min hill level 3, resistnace 3     Lumbar Exercises: Seated   Sit to Stand 10 reps   Sit to Stand Limitations B UEs no brace, min guard; hyperextension noted L knee      Lumbar Exercises: Supine   Bridge 20 reps   Other Supine Lumbar Exercises dead bug x 15     Lumbar Exercises: Prone   Straight Leg Raise 10 reps  trunk rotation noted due to weakness   Other Prone Lumbar Exercises hamstring curls 10x AAROM Lt LE     Lumbar Exercises: Quadruped   [place] 5 reps   Straight Leg Raise 10 reps   Other Quadruped Lumbar Exercises quadruped unilateral UE flexion 10x each; crawling 4 RT     Knee/Hip Exercises: Stretches   Other Knee/Hip Stretches child pose x 4 x 20"     Knee/Hip Exercises: Standing   Hip Abduction Both;10 reps   Other Standing Knee Exercises sidestep inside // bars 3RT   Other Standing Knee Exercises standing no UE A 5 attempts with best 23"                  PT Short Term Goals - 03/10/16 7026      PT SHORT TERM GOAL #1   Title Patient to be able to ambulate at least 410f with rollator and KAFO in order to demonstrate improvement in mobilty     Time 3   Period Weeks   Status On-going     PT SHORT TERM GOAL #2   Title Patient to be able to tolerate CKC exercise with KAFO unlocked and minimal unsteadiness/instability in order to demonstrate improvement in condition    Time 3   Period Weeks   Status Partially Met  PT SHORT TERM GOAL #3   Title Patient to be able to statically stand with KAFO locked and no UE support in order to demonstrate improved balance    Time 3   Period Weeks   Status On-going     PT SHORT TERM GOAL #4   Title Patient will  consistetly perform appropriate HEP correctly, to be updated PRN    Time 3   Period Weeks   Status Achieved           PT Long Term Goals - 03/10/16 3570      PT LONG TERM GOAL #1   Title Patient will demonstrate L LE strength at least 4-/5 and 5/5 muslce strength in all other tested groups in order to assist in improving gait and stablity    Time 6   Period Weeks   Status On-going     PT LONG TERM GOAL #2   Title Patient to be able to ambulate and perform functional mobility with AFO and rollator rather than KAFO in order to demonstrate improved strength and mobiltiy    Time 6   Period Weeks   Status On-going     PT LONG TERM GOAL #3   Title Patient to be able to complete TUG balance test in 18 seconds or less wtih rollator in order to demonstrate improved mobility and reduced fall risk    Baseline 8/10: 26 seconds ( significant time to unlock and lock brace   Time 6   Period Weeks   Status On-going     PT LONG TERM GOAL #4   Title Patient to be able to ambulate at least 643f with  rollator during 3MWT in order to demonstrate improved overall mobilty    Time 6   Period Weeks   Status On-going               Plan - 03/27/16 1205    Clinical Impression Statement Continued session focus on improving LE strengthening wiht therapist facilitation for proper form and technique with majority of exercises.  Pt continues to required AAROM with Lt LE hamstring  curls and gluteal strengthening.  Sit to stand complete without brace for strengthening wiht min A required for safety.  No reports of increased pain through session, was limited by fatigue with activities.     Rehab Potential Good   PT Frequency 2x / week   PT Duration --  increase from 6 to 10 weeks   PT Treatment/Interventions ADLs/Self Care Home Management;Electrical Stimulation;Gait training;Stair training;Functional mobility training;Therapeutic activities;Therapeutic exercise;Balance training;Neuromuscular re-education;Patient/family education;Manual techniques;Energy conservation;Taping   PT Next Visit Plan continue functional strength, try to progress out of brace for CKC strength; continue Nustep       Patient will benefit from skilled therapeutic intervention in order to improve the following deficits and impairments:  Abnormal gait, Decreased activity tolerance, Decreased strength, Decreased balance, Decreased mobility, Difficulty walking, Improper body mechanics, Decreased coordination, Impaired flexibility, Postural dysfunction  Visit Diagnosis: Difficulty in walking, not elsewhere classified  Muscle weakness (generalized)  Unsteadiness on feet     Problem List Patient Active Problem List   Diagnosis Date Noted  . Cauda equina spinal cord injury ([facility] 10/26/2015  . Infection of urinary tract 10/26/2015  . Intractable back pain 10/22/2015  . Back pain 10/22/2015  . Muscle weakness (generalized) 12/05/2013  . Tight fascia 12/05/2013  . Decreased range of motion of right shoulder 12/05/2013  . Decreased range of motion of left shoulder 12/05/2013  . Bilateral  leg weakness 11/21/2013  . Poor balance 11/21/2013  . Difficulty in walking(719.7) 11/21/2013  . Shoulder pain, left 09/12/2013  . Anemia, iron deficiency 09/09/2013  . Dyspnea 09/07/2013  . UTI (urinary tract infection) 09/07/2013  . Acute CHF (congestive heart failure) ([place] [facility]) 09/07/2013  . Elevated troponin  09/07/2013  . Fever and chills 09/06/2013  . Altered mental status 09/05/2013  . Neurogenic bowel 08/22/2013  . Cauda equina syndrome with neurogenic bladder ([facility]) 08/22/2013  . Gout flare 08/22/2013  . Postoperative wound infection 08/22/2013  . Lumbar degenerative disc disease 08/02/2013  . Post-operative pain 07/29/2013  . Osteonecrosis ([place]) 05/05/2013  . Small bowel obstruction ([place]) 04/17/2013  . Acute renal failure ([place]) 04/17/2013  . Medial meniscus, posterior horn derangement 02/17/2013  . Knee sprain and strain 02/17/2013  . Trigger point with neck pain 03/18/2012  . Rotator cuff tear arthropathy of right shoulder 09/08/2011  . CAD in native artery 01/09/2011  . Ankle fracture 12/25/2010  . Contusion of shoulder, left 12/25/2010  . PEMPHIGUS VULGARIS 07/02/2010  . MRSA 05/13/2010  . PRURITUS 05/13/2010  . SPINAL STENOSIS, LUMBAR 05/13/2010  . HLD (hyperlipidemia) 04/24/2010  . GASTROESOPHAGEAL REFLUX DISEASE 04/24/2010  . VENTRAL HERNIA, INCISIONAL 04/24/2010  . DEGENERATIVE JOINT DISEASE 04/24/2010  . PYOGENIC ARTHRITIS, SHOULDER REGION 02/04/2010  . Essential hypertension 01/01/2010  . RUPTURE ROTATOR CUFF 02/19/2009   Ihor Austin, [place]; [place]  Aldona Lento 03/27/2016, 12:10 PM  [place], [place], [address] Phone: [phone]   Fax:  [phone]  Name: [name] MRN: [id number] Date of Birth: 1940/08/06

## 2016-04-01 ENCOUNTER — Ambulatory Visit (HOSPITAL_COMMUNITY): Payer: Medicare Other | Attending: Pulmonary Disease | Admitting: Physical Therapy

## 2016-04-01 DIAGNOSIS — M6281 Muscle weakness (generalized): Secondary | ICD-10-CM

## 2016-04-01 DIAGNOSIS — M629 Disorder of muscle, unspecified: Secondary | ICD-10-CM | POA: Insufficient documentation

## 2016-04-01 DIAGNOSIS — M25611 Stiffness of right shoulder, not elsewhere classified: Secondary | ICD-10-CM | POA: Insufficient documentation

## 2016-04-01 DIAGNOSIS — M7582 Other shoulder lesions, left shoulder: Secondary | ICD-10-CM | POA: Diagnosis not present

## 2016-04-01 DIAGNOSIS — R262 Difficulty in walking, not elsewhere classified: Secondary | ICD-10-CM | POA: Diagnosis not present

## 2016-04-01 DIAGNOSIS — M25612 Stiffness of left shoulder, not elsewhere classified: Secondary | ICD-10-CM | POA: Insufficient documentation

## 2016-04-01 DIAGNOSIS — R2689 Other abnormalities of gait and mobility: Secondary | ICD-10-CM | POA: Diagnosis not present

## 2016-04-01 DIAGNOSIS — G834 Cauda equina syndrome: Secondary | ICD-10-CM | POA: Diagnosis not present

## 2016-04-01 DIAGNOSIS — R29898 Other symptoms and signs involving the musculoskeletal system: Secondary | ICD-10-CM | POA: Insufficient documentation

## 2016-04-01 DIAGNOSIS — M25511 Pain in right shoulder: Secondary | ICD-10-CM | POA: Diagnosis not present

## 2016-04-01 DIAGNOSIS — R2681 Unsteadiness on feet: Secondary | ICD-10-CM | POA: Insufficient documentation

## 2016-04-01 NOTE — Therapy (Signed)
[place], [place], [address] Phone: [phone]   Fax:  [phone]  Physical Therapy Treatment  Patient Details  Name: [name] MRN: [id number] Date of Birth: [birth year] Referring Provider: Sinda Du   Encounter Date: 04/01/2016      Jake End of Session - 04/01/16 1016    Visit Number 15   Number of Visits 20   Date for Jake Re-Evaluation 04/03/16   Authorization Type Medicare/[employer]    Authorization Time Period 02/06/16 to 03/19/16   g code done on visit 10    Authorization - Visit Number 15   Authorization - Number of Visits 20   Jake Start Time 0945   Jake Stop Time 1030   Jake Time Calculation (min) 45 min   Equipment Utilized During Treatment Gait belt   Activity Tolerance Patient tolerated treatment well;No increased pain   Behavior During Therapy WFL for tasks assessed/performed      Past Medical History:  Diagnosis Date  . Acute renal failure ([place]) 04/17/2013  . Anemia, iron deficiency 09/09/2013  . Anxiety   . Arthritis   . 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 Coronary artery disease    IN 2000   STENT PLACED IN 2012 sees Dr. Lattie Haw, saw last 2013  . Disorder of blood    BEEN TREATED BY DERMATOLOGIST X 4 YRS..."BLOOD BLISTERS"  . Gout   . HTN (hypertension)    sees Dr. [name] in [place]  . Hx MRSA infection    rt shoulder  . Hyperlipidemia   . Pneumonia    "I've had it 3-4 times"  . Small bowel obstruction ([place])   . Small bowel problem    HAD ALOT OF SCAR TISSUE FROM PREVIOUS SURGERIES..NG WAS INSERTED ...Marland KitchenMarland KitchenNO SURGERY NEEDED...Marland KitchenMarland KitchenIN FOR 8 DAYS  . Ventral hernia     Past Surgical History:  Procedure Laterality Date  . ANTERIOR CERVICAL CORPECTOMY  12/17/11  . ANTERIOR CERVICAL CORPECTOMY  12/17/2011   Procedure: ANTERIOR CERVICAL CORPECTOMY;  Surgeon: Floyce Stakes, MD;  Location: [facility];  Service: Neurosurgery;  Laterality: N/A;  Anterior  Cervical Decompression Fusion Five to Thoracic Two with plating  . BACK SURGERY     lumbar  . CATARACT EXTRACTION W/ INTRAOCULAR LENS  IMPLANT, BILATERAL  ? 2011  . CHOLECYSTECTOMY  2006 "or after"  . CORONARY ANGIOPLASTY WITH STENT PLACEMENT  2012  . CORONARY ARTERY BYPASS GRAFT  2000   CABG X5  . EYE SURGERY     bilateral cataract  . INCISION AND DRAINAGE OF WOUND  ~ 20ll; 12/03/11   "had infection in my right"  . LUMBAR LAMINECTOMY/DECOMPRESSION MICRODISCECTOMY  06/13/2011   Procedure: LUMBAR LAMINECTOMY/DECOMPRESSION MICRODISCECTOMY;  Surgeon: Floyce Stakes;  Location: [facility];  Service: Neurosurgery;  Laterality: N/A;  Lumbar three, lumbar four-five Laminectomy  . LUMBAR LAMINECTOMY/DECOMPRESSION MICRODISCECTOMY Right 06/17/2013   Procedure: LUMBAR LAMINECTOMY/DECOMPRESSION MICRODISCECTOMY 1 LEVEL;  Surgeon: Floyce Stakes, MD;  Location: [facility];  Service: Neurosurgery;  Laterality: Right;  Right L3-4 Microdiskectomy  . LUMBAR WOUND DEBRIDEMENT N/A 08/02/2013   Procedure: INCISION AND DRAINAGE OF LUMBAR WOUND DEBRIDEMENT;  Surgeon: Floyce Stakes, MD;  Location: [facility];  Service: Neurosurgery;  Laterality: N/A;  . [place] CATHETER INSERTION  2011 & 11/2011  . SHOULDER ARTHROSCOPY Left 09/13/2013   Procedure: ARTHROSCOPIC IRRIGATION AND DEBRIDEMENT, SYNOVECTOMY ,  LEFT SHOULDER ;  Surgeon:  Yvette Rack., MD;  Location: [facility];  Service: Orthopedics;  Laterality: Left;  . SHOULDER OPEN ROTATOR CUFF REPAIR  ~ 2011   right  . STERNAL SURG  2000   HAS HAD 5-6 ON HIS STERNUM; "caught MRSA in it"  . TONSILLECTOMY  1949    There were no vitals filed for this visit.      Subjective Assessment - 04/01/16 0950    Subjective Jake states that he went to a cook out yesterday and is having increased pain on the left side of his back.     Pertinent History beta blockers, CHF/CAD, HTN, history of lumbar laminectomy, CABG, history of ventral hernia, history  of AVN B hips, history of cervical corpectomy, brace secondary to no sternum   Patient Stated Goals get leg working better, get walking    Currently in Pain? Yes   Pain Score 5    Pain Location Back   Pain Orientation Left   Pain Descriptors / Indicators Aching;Burning   Pain Onset More than a month ago   Pain Frequency Constant                         [facility] Adult Jake Treatment/Exercise - 04/01/16 0001      Exercises   Exercises Lumbar     Lumbar Exercises: Stretches   Prone Mid Back Stretch 3 reps;30 seconds     Lumbar Exercises: Aerobic   Stationary Bike Nuestep x 10:00 after session was over      Lumbar Exercises: Standing   Functional Squats 10 reps   Other Standing Lumbar Exercises step out to the left z 10      Lumbar Exercises: Prone   Straight Leg Raise 10 reps   Other Prone Lumbar Exercises bilateral knee flexion Bilateral    Other Prone Lumbar Exercises heel squeeze,glut sets x 15      Lumbar Exercises: Quadruped   [place] 5 reps   Straight Leg Raise 10 reps   Straight Leg Raises Limitations Lt AA    Other Quadruped Lumbar Exercises crawling    Other Quadruped Lumbar Exercises tall kneeling balance activity      Knee/Hip Exercises: Stretches   Other Knee/Hip Stretches child pose x 4 x 20"     Knee/Hip Exercises: Sidelying   Hip ABduction Both;2 sets;10 reps   Hip ABduction Limitations AA on LT      Modalities   Modalities Moist Heat                  Jake Short Term Goals - 03/10/16 7858      Jake SHORT TERM GOAL #1   Title Patient to be able to ambulate at least 427f with rollator and KAFO in order to demonstrate improvement in mobilty    Time 3   Period Weeks   Status On-going     Jake SHORT TERM GOAL #2   Title Patient to be able to tolerate CKC exercise with KAFO unlocked and minimal unsteadiness/instability in order to demonstrate improvement in condition    Time 3   Period Weeks   Status Partially Met     Jake  SHORT TERM GOAL #3   Title Patient to be able to statically stand with KAFO locked and no UE support in order to demonstrate improved balance    Time 3   Period Weeks   Status On-going     Jake SHORT TERM GOAL #4  Title Patient will  consistetly perform appropriate HEP correctly, to be updated PRN    Time 3   Period Weeks   Status Achieved           Jake Long Term Goals - 03/10/16 3039      Jake LONG TERM GOAL #1   Title Patient will demonstrate L LE strength at least 4-/5 and 5/5 muslce strength in all other tested groups in order to assist in improving gait and stablity    Time 6   Period Weeks   Status On-going     Jake LONG TERM GOAL #2   Title Patient to be able to ambulate and perform functional mobility with AFO and rollator rather than KAFO in order to demonstrate improved strength and mobiltiy    Time 6   Period Weeks   Status On-going     Jake LONG TERM GOAL #3   Title Patient to be able to complete TUG balance test in 18 seconds or less wtih rollator in order to demonstrate improved mobility and reduced fall risk    Baseline 8/10: 26 seconds ( significant time to unlock and lock brace   Time 6   Period Weeks   Status On-going     Jake LONG TERM GOAL #4   Title Patient to be able to ambulate at least 656ft with  rollator during in order to demonstrate improved overall mobilty    Time 6   Period Weeks   Status On-going               Plan - 04/01/16 1016    Clinical Impression Statement Jake had increased back pain therefore placed HMP on low back throughout prone and quadriped exercises, no charge.  Jake improved greatly in crawling abiliity but has significant decreased tolerance for tall kneeling positioning.    Rehab Potential Good   Jake Frequency 2x / week   Jake Duration --  increase from 6 to 10 weeks   Jake Treatment/Interventions ADLs/Self Care Home Management;Electrical Stimulation;Gait training;Stair training;Functional mobility training;Therapeutic  activities;Therapeutic exercise;Balance training;Neuromuscular re-education;Patient/family education;Manual techniques;Energy conservation;Taping   Jake Next Visit Plan continue functional strength, try to progress out of brace for CKC strength; continue Nustep       Patient will benefit from skilled therapeutic intervention in order to improve the following deficits and impairments:  Abnormal gait, Decreased activity tolerance, Decreased strength, Decreased balance, Decreased mobility, Difficulty walking, Improper body mechanics, Decreased coordination, Impaired flexibility, Postural dysfunction  Visit Diagnosis: Difficulty in walking, not elsewhere classified  Muscle weakness (generalized)  Unsteadiness on feet     Problem List Patient Active Problem List   Diagnosis Date Noted  . 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 Infection of urinary tract 10/26/2015  . Intractable back pain 10/22/2015  . Back pain 10/22/2015  . Muscle weakness (generalized) 12/05/2013  . Tight fascia 12/05/2013  . Decreased range of motion of right shoulder 12/05/2013  . Decreased range of motion of left shoulder 12/05/2013  . Bilateral leg weakness 11/21/2013  . Poor balance 11/21/2013  . Difficulty in walking(719.7) 11/21/2013  . Shoulder pain, left 09/12/2013  . Anemia, iron deficiency 09/09/2013  . Dyspnea 09/07/2013  . UTI (urinary tract infection) 09/07/2013  . Acute CHF (congestive heart failure) (HCC) 09/07/2013  . Elevated troponin 09/07/2013  . Fever and chills 09/06/2013  . Altered mental status 09/05/2013  . Neurogenic bowel 08/22/2013  . 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 Gout  flare 08/22/2013  . Postoperative wound infection 08/22/2013  . Lumbar degenerative disc disease 08/02/2013  . Post-operative pain 07/29/2013  . Osteonecrosis ([place]) 05/05/2013  . Small bowel obstruction ([place]) 04/17/2013  . Acute renal failure ([place]) 04/17/2013  .  Medial meniscus, posterior horn derangement 02/17/2013  . Knee sprain and strain 02/17/2013  . Trigger point with neck pain 03/18/2012  . Rotator cuff tear arthropathy of right shoulder 09/08/2011  . CAD in native artery 01/09/2011  . Ankle fracture 12/25/2010  . Contusion of shoulder, left 12/25/2010  . PEMPHIGUS VULGARIS 07/02/2010  . MRSA 05/13/2010  . PRURITUS 05/13/2010  . SPINAL STENOSIS, LUMBAR 05/13/2010  . HLD (hyperlipidemia) 04/24/2010  . GASTROESOPHAGEAL REFLUX DISEASE 04/24/2010  . VENTRAL HERNIA, INCISIONAL 04/24/2010  . DEGENERATIVE JOINT DISEASE 04/24/2010  . PYOGENIC ARTHRITIS, SHOULDER REGION 02/04/2010  . Essential hypertension 01/01/2010  . RUPTURE ROTATOR CUFF 02/19/2009    [name], [name] [phone] 04/01/2016, 11:00 AM  [place], [place], [address] Phone: [phone]   Fax:  [phone]  Name: [name] MRN: [id number] Date of Birth: 21-Mar-1941

## 2016-04-03 ENCOUNTER — Ambulatory Visit (HOSPITAL_COMMUNITY): Payer: Medicare Other | Admitting: Physical Therapy

## 2016-04-03 ENCOUNTER — Telehealth (HOSPITAL_COMMUNITY): Payer: Self-pay

## 2016-04-03 NOTE — Telephone Encounter (Signed)
97/17 I called to offer him a 4pm appt today since Arline AspCindy was out ... He cuoldn't take it and said he would just be here at the Monday appt

## 2016-04-07 ENCOUNTER — Ambulatory Visit (HOSPITAL_COMMUNITY): Payer: Medicare Other | Admitting: Physical Therapy

## 2016-04-07 DIAGNOSIS — R2681 Unsteadiness on feet: Secondary | ICD-10-CM

## 2016-04-07 DIAGNOSIS — M629 Disorder of muscle, unspecified: Secondary | ICD-10-CM | POA: Diagnosis not present

## 2016-04-07 DIAGNOSIS — M6281 Muscle weakness (generalized): Secondary | ICD-10-CM | POA: Diagnosis not present

## 2016-04-07 DIAGNOSIS — R262 Difficulty in walking, not elsewhere classified: Secondary | ICD-10-CM | POA: Diagnosis not present

## 2016-04-07 DIAGNOSIS — M25611 Stiffness of right shoulder, not elsewhere classified: Secondary | ICD-10-CM | POA: Diagnosis not present

## 2016-04-07 DIAGNOSIS — R2689 Other abnormalities of gait and mobility: Secondary | ICD-10-CM | POA: Diagnosis not present

## 2016-04-07 NOTE — Therapy (Signed)
[facility] [address] [place], [place], [address] Phone: [phone]   Fax:  [phone]  Physical Therapy Treatment  Patient Details  Name: [name] MRN: [id number] Date of Birth: 02-22-1941 Referring Provider: [name]   Encounter Date: 04/07/2016      PT End of Session - 04/07/16 1231    Visit Number 16   Number of Visits 32   Date for PT Re-Evaluation 05/07/16   Authorization Type Medicare/[employer]    Authorization Time Period 9/11/20017-06/06/2016   g code done on visit 16   Authorization - Visit Number 15   Authorization - Number of Visits 20   PT Start Time 0900   PT Stop Time 0944   PT Time Calculation (min) 44 min   Equipment Utilized During Treatment Gait belt   Activity Tolerance Patient tolerated treatment well;No increased pain   Behavior During Therapy WFL for tasks assessed/performed      Past Medical History:  Diagnosis Date  . Acute renal failure (HCC) 04/17/2013  . Anemia, iron deficiency 09/09/2013  . Anxiety   . Arthritis   . 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 Coronary artery disease    IN 2000   STENT PLACED IN 2012 sees Dr. Dietrich Pates, saw last 2013  . Disorder of blood    BEEN TREATED BY DERMATOLOGIST X 4 YRS..."BLOOD BLISTERS"  . Gout   . HTN (hypertension)    sees [name] in [place]  . Hx MRSA infection    rt shoulder  . Hyperlipidemia   . Pneumonia    "I've had it 3-4 times"  . Small bowel obstruction ([facility])   . Small bowel problem    HAD ALOT OF SCAR TISSUE FROM PREVIOUS SURGERIES..NG WAS INSERTED ...Marland KitchenMarland KitchenNO SURGERY NEEDED...Marland KitchenMarland KitchenIN FOR 8 DAYS  . Ventral hernia     Past Surgical History:  Procedure Laterality Date  . ANTERIOR CERVICAL CORPECTOMY  12/17/11  . ANTERIOR CERVICAL CORPECTOMY  12/17/2011   Procedure: ANTERIOR CERVICAL CORPECTOMY;  Surgeon: Karn Cassis, MD;  Location: [facility];  Service: Neurosurgery;  Laterality: N/A;   Anterior Cervical Decompression Fusion Five to Thoracic Two with plating  . BACK SURGERY     lumbar  . CATARACT EXTRACTION W/ INTRAOCULAR LENS  IMPLANT, BILATERAL  ? 2011  . CHOLECYSTECTOMY  2006 "or after"  . CORONARY ANGIOPLASTY WITH STENT PLACEMENT  2012  . CORONARY ARTERY BYPASS GRAFT  2000   CABG X5  . EYE SURGERY     bilateral cataract  . INCISION AND DRAINAGE OF WOUND  ~ 20ll; 12/03/11   "had infection in my right"  . LUMBAR LAMINECTOMY/DECOMPRESSION MICRODISCECTOMY  06/13/2011   Procedure: LUMBAR LAMINECTOMY/DECOMPRESSION MICRODISCECTOMY;  Surgeon: Karn Cassis;  Location: [facility];  Service: Neurosurgery;  Laterality: N/A;  Lumbar three, lumbar four-five Laminectomy  . LUMBAR LAMINECTOMY/DECOMPRESSION MICRODISCECTOMY Right 06/17/2013   Procedure: LUMBAR LAMINECTOMY/DECOMPRESSION MICRODISCECTOMY 1 LEVEL;  Surgeon: Karn Cassis, MD;  Location: [facility];  Service: Neurosurgery;  Laterality: Right;  Right L3-4 Microdiskectomy  . LUMBAR WOUND DEBRIDEMENT N/A 08/02/2013   Procedure: INCISION AND DRAINAGE OF LUMBAR WOUND DEBRIDEMENT;  Surgeon: Karn Cassis, MD;  Location: [facility];  Service: Neurosurgery;  Laterality: N/A;  . PERIPHERALLY INSERTED CENTRAL CATHETER INSERTION  2011 & 11/2011  . SHOULDER ARTHROSCOPY Left 09/13/2013   Procedure: ARTHROSCOPIC IRRIGATION AND DEBRIDEMENT, SYNOVECTOMY ,  LEFT SHOULDER ;  Surgeon: Dyke Brackett  Montez Hageman., MD;  Location: [facility];  Service: Orthopedics;  Laterality: Left;  . SHOULDER OPEN ROTATOR CUFF REPAIR  ~ 2011   right  . STERNAL SURG  2000   HAS HAD 5-6 ON HIS STERNUM; "caught MRSA in it"  . TONSILLECTOMY  1949    There were no vitals filed for this visit.      Subjective Assessment - 04/07/16 0902    Subjective [name] states that he can tell that he is walking better and getting stronger.  He had to go up steps at his church due to the elevator breaking    Pertinent History beta blockers, CHF/CAD, HTN, history of lumbar  laminectomy, CABG, history of ventral hernia, history of AVN B hips, history of cervical corpectomy, brace secondary to no sternum   How long can you sit comfortably? no problem    How long can you stand comfortably? 10 minutes max   How long can you walk comfortably? walking with rollator for less than five minutes at a time    Patient Stated Goals get leg working better, get walking    Currently in Pain? Yes   Pain Score 2    Pain Location Back   Pain Orientation Right   Pain Descriptors / Indicators Aching   Pain Onset More than a month ago   Pain Frequency Constant   Aggravating Factors  weight bearing    Pain Relieving Factors stretching, heat and medication    Effect of Pain on Daily Activities increases pain             [facility] PT Assessment - 04/07/16 0001      Assessment   Medical Diagnosis back fractuer of second lumbar vertebra    Referring Provider [name]    Onset Date/Surgical Date --  approximately 12/07/2015   Next MD Visit [name] in 6 weeks      Precautions   Precaution Comments no lifting anything heavy; otherwise stay within pain limits per patient but MD may update precautions next week      Balance Screen   Has the patient fallen in the past 6 months No   Has the patient had a decrease in activity level because of a fear of falling?  No   Is the patient reluctant to leave their home because of a fear of falling?  No     Prior Function   Level of Independence Independent;Independent with basic ADLs;Independent with gait;Independent with transfers   Vocation Retired   Leisure none, just get back to walking      Strength   Right Hip Flexion 5/5  was 5/5   Right Hip Extension 3/5  was 3/5    Right Hip ABduction 4-/5  was 3-/5    Left Hip Flexion 4/5  was 3/5 last re-evaluation    Left Hip Extension 3-/5   Left Hip ABduction 3-/5  was 3-/5    Right Knee Flexion 3+/5  was a 3/5 last re-evaluation    Right Knee Extension 5/5  was 5/5    Left Knee Flexion 2+/5  was 2/5 at last reassessment    Left Knee Extension 4-/5  was 3+/5   Right Ankle Dorsiflexion 3+/5  was 3+/5    Left Ankle Dorsiflexion 1/5  was 1/5      Ambulation/Gait   Gait Comments use of KAFO throughout gait as patient''s L LE buckles without; reduced heel-toe pattern and gait speed, flexed at hips, excessive use of UEs on  walker      6 minute walk test results    Aerobic Endurance Distance Walked 226   Endurance additional comments 2:10MWT, rollator, KAFO   was 2:30 and had to stop; pt able to continue 2:40for 264'     High Level Balance   High Level Balance Comments TUG 24.78 with KAFO and rollator   was 36.6 seconds                      [facility] Adult PT Treatment/Exercise - 04/07/16 0001      Knee/Hip Exercises: Standing   Gait Training 264' with rollator      Knee/Hip Exercises: Sidelying   Hip ABduction Both;10 reps   Hip ABduction Limitations knee bent at 90 degrees      Knee/Hip Exercises: Prone   Hamstring Curl 10 reps  Rt wint 2#    Hamstring Curl Limitations Lt done sidelying with 4# wt                 PT Education - 04/07/16 1230    Education provided Yes   Education Details To complete knee flexion for Lt LE sidelying; hip extension with knee flexed while prone.     Person(s) Educated Patient   Methods Explanation   Comprehension Verbalized understanding;Returned demonstration          PT Short Term Goals - 04/07/16 1236      PT SHORT TERM GOAL #1   Title Patient to be able to ambulate at least 473ft with rollator and KAFO in order to demonstrate improvement in mobilty    Time 3   Period Weeks   Status On-going     PT SHORT TERM GOAL #2   Title Patient to be able to tolerate CKC exercise with KAFO unlocked and minimal unsteadiness/instability in order to demonstrate improvement in condition    Time 3   Period Weeks   Status Achieved     PT SHORT TERM GOAL #3   Title Patient to be able to  statically stand with KAFO locked and no UE support in order to demonstrate improved balance    Time 3   Period Weeks   Status On-going     PT SHORT TERM GOAL #4   Title Patient will  consistetly perform appropriate HEP correctly, to be updated PRN    Time 3   Period Weeks   Status Achieved           PT Long Term Goals - 04/07/16 1237      PT LONG TERM GOAL #1   Title Patient will demonstrate L LE strength at least 4-/5 and 5/5 muslce strength in all other tested groups in order to assist in improving gait and stablity    Time 6   Period Weeks   Status On-going     PT LONG TERM GOAL #2   Title Patient to be able to ambulate and perform functional mobility with AFO and rollator rather than KAFO in order to demonstrate improved strength and mobiltiy    Time 6   Period Weeks   Status On-going     PT LONG TERM GOAL #3   Title Patient to be able to complete TUG balance test in 18 seconds or less wtih rollator in order to demonstrate improved mobility and reduced fall risk    Time 6   Period Weeks   Status On-going     PT LONG TERM GOAL #4   Title Patient  to be able to ambulate at least 615ft with  rollator during in order to demonstrate improved overall mobilty    Time 6   Period Weeks   Status On-going               Plan - 04-21-2016 1233    Clinical Impression Statement Pt reassessed with noted improvement in speed of ambulation, mm strength continues to improve as well as stability of walking.  Therapist recommends continuing skilled physical therapy to improve balance and strength twice a week for eight more weeks due to pt complicated situation.    Rehab Potential Good   PT Frequency 2x / week   PT Duration Other (comment)  increase to total of 16 weeks    PT Treatment/Interventions ADLs/Self Care Home Management;Electrical Stimulation;Gait training;Stair training;Functional mobility training;Therapeutic activities;Therapeutic exercise;Balance  training;Neuromuscular re-education;Patient/family education;Manual techniques;Energy conservation;Taping   PT Next Visit Plan continue functional strength, try to progress out of brace for CKC strength; continue Nustep and balance activity.   Consulted and Agree with Plan of Care Patient      Patient will benefit from skilled therapeutic intervention in order to improve the following deficits and impairments:  Abnormal gait, Decreased activity tolerance, Decreased strength, Decreased balance, Decreased mobility, Difficulty walking, Improper body mechanics, Decreased coordination, Impaired flexibility, Postural dysfunction  Visit Diagnosis: Difficulty in walking, not elsewhere classified - Plan: PT plan of care cert/re-cert  Muscle weakness (generalized) - Plan: PT plan of care cert/re-cert  Unsteadiness on feet - Plan: PT plan of care cert/re-cert       G-Codes - 04-21-16 1238    Functional Assessment Tool Used Clinical testing of walk test    Functional Limitation Mobility: Walking and moving around   Mobility: Walking and Moving Around Current Status [phone]) At least 60 percent but less than 80 percent impaired, limited or restricted   Mobility: Walking and Moving Around Goal Status [phone]) At least 40 percent but less than 60 percent impaired, limited or restricted      Problem List Patient Active Problem List   Diagnosis Date Noted  . 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 Infection of urinary tract 10/26/2015  . Intractable back pain 10/22/2015  . Back pain 10/22/2015  . Muscle weakness (generalized) 12/05/2013  . Tight fascia 12/05/2013  . Decreased range of motion of right shoulder 12/05/2013  . Decreased range of motion of left shoulder 12/05/2013  . Bilateral leg weakness 11/21/2013  . Poor balance 11/21/2013  . Difficulty in walking(719.7) 11/21/2013  . Shoulder pain, left 09/12/2013  . Anemia, iron deficiency 09/09/2013  . Dyspnea 09/07/2013  . UTI  (urinary tract infection) 09/07/2013  . Acute CHF (congestive heart failure) (HCC) 09/07/2013  . Elevated troponin 09/07/2013  . Fever and chills 09/06/2013  . Altered mental status 09/05/2013  . Neurogenic bowel 08/22/2013  . 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 Gout flare 08/22/2013  . Postoperative wound infection 08/22/2013  . Lumbar degenerative disc disease 08/02/2013  . Post-operative pain 07/29/2013  . Osteonecrosis (HCC) 05/05/2013  . Small bowel obstruction (HCC) 04/17/2013  . Acute renal failure (HCC) 04/17/2013  . Medial meniscus, posterior horn derangement 02/17/2013  . Knee sprain and strain 02/17/2013  . Trigger point with neck pain 03/18/2012  . Rotator cuff tear arthropathy of right shoulder 09/08/2011  . CAD in native artery 01/09/2011  . Ankle fracture 12/25/2010  . Contusion of shoulder, left 12/25/2010  . PEMPHIGUS VULGARIS 07/02/2010  . MRSA  05/13/2010  . PRURITUS 05/13/2010  . SPINAL STENOSIS, LUMBAR 05/13/2010  . HLD (hyperlipidemia) 04/24/2010  . GASTROESOPHAGEAL REFLUX DISEASE 04/24/2010  . VENTRAL HERNIA, INCISIONAL 04/24/2010  . DEGENERATIVE JOINT DISEASE 04/24/2010  . PYOGENIC ARTHRITIS, SHOULDER REGION 02/04/2010  . Essential hypertension 01/01/2010  . RUPTURE ROTATOR CUFF 02/19/2009   Virgina Organynthia Russell, PT CLT [phone] 04/07/2016, 12:42 PM  [facility] [address] [place], [place], [address] Phone: [phone]   Fax:  [phone]  Name: [name] MRN: [id number] Date of Birth: 12/01/1940

## 2016-04-10 ENCOUNTER — Telehealth (HOSPITAL_COMMUNITY): Payer: Self-pay

## 2016-04-10 ENCOUNTER — Ambulatory Visit (HOSPITAL_COMMUNITY): Payer: Medicare Other | Admitting: Physical Therapy

## 2016-04-10 NOTE — Telephone Encounter (Signed)
04/10/16 pt left a messge at 8:52 to cx - he was having stomach issues

## 2016-04-11 DIAGNOSIS — N390 Urinary tract infection, site not specified: Secondary | ICD-10-CM | POA: Diagnosis not present

## 2016-04-14 ENCOUNTER — Encounter: Payer: Self-pay | Admitting: Cardiovascular Disease

## 2016-04-14 ENCOUNTER — Ambulatory Visit (HOSPITAL_COMMUNITY): Payer: Medicare Other | Admitting: Physical Therapy

## 2016-04-14 ENCOUNTER — Ambulatory Visit: Payer: Medicare Other | Admitting: Cardiovascular Disease

## 2016-04-14 ENCOUNTER — Ambulatory Visit (INDEPENDENT_AMBULATORY_CARE_PROVIDER_SITE_OTHER): Payer: Medicare Other | Admitting: Cardiovascular Disease

## 2016-04-14 VITALS — BP 176/66 | HR 64 | Ht 72.0 in | Wt 176.0 lb

## 2016-04-14 DIAGNOSIS — I25810 Atherosclerosis of coronary artery bypass graft(s) without angina pectoris: Secondary | ICD-10-CM | POA: Diagnosis not present

## 2016-04-14 DIAGNOSIS — M25611 Stiffness of right shoulder, not elsewhere classified: Secondary | ICD-10-CM | POA: Diagnosis not present

## 2016-04-14 DIAGNOSIS — E782 Mixed hyperlipidemia: Secondary | ICD-10-CM | POA: Diagnosis not present

## 2016-04-14 DIAGNOSIS — I10 Essential (primary) hypertension: Secondary | ICD-10-CM

## 2016-04-14 DIAGNOSIS — R262 Difficulty in walking, not elsewhere classified: Secondary | ICD-10-CM

## 2016-04-14 DIAGNOSIS — R2689 Other abnormalities of gait and mobility: Secondary | ICD-10-CM | POA: Diagnosis not present

## 2016-04-14 DIAGNOSIS — R2681 Unsteadiness on feet: Secondary | ICD-10-CM

## 2016-04-14 DIAGNOSIS — M629 Disorder of muscle, unspecified: Secondary | ICD-10-CM | POA: Diagnosis not present

## 2016-04-14 DIAGNOSIS — M6281 Muscle weakness (generalized): Secondary | ICD-10-CM

## 2016-04-14 DIAGNOSIS — I5042 Chronic combined systolic (congestive) and diastolic (congestive) heart failure: Secondary | ICD-10-CM | POA: Diagnosis not present

## 2016-04-14 NOTE — Patient Instructions (Signed)
Your physician wants you to follow-up in: 1 Year Dr Reggy Eyekoneswaran You will receive a reminder letter in the mail two months in advance. If you don't receive a letter, please call our office to schedule the follow-up appointment.     Your physician recommends that you continue on your current medications as directed. Please refer to the Current Medication list given to you today.    If you need a refill on your cardiac medications before your next appointment, please call your pharmacy.   Thank you for choosing [facility] !

## 2016-04-14 NOTE — Therapy (Signed)
[facility] [address] [place], [place], [address] Phone: [phone]   Fax:  [phone]  Physical Therapy Treatment  Patient Details  Name: [name] MRN: [id number] Date of Birth: 1941/02/19 Referring Provider: [name]   Encounter Date: 04/14/2016      PT End of Session - 04/14/16 0949    Visit Number 17   Number of Visits 32   Date for PT Re-Evaluation 05/07/16   Authorization Type Medicare/[employer]    Authorization Time Period 9/11/20017-06/06/2016   g code done on visit 16   Authorization - Visit Number 16   Authorization - Number of Visits 20   PT Start Time 0901   PT Stop Time 0943   PT Time Calculation (min) 42 min   Equipment Utilized During Treatment Gait belt   Activity Tolerance Patient tolerated treatment well;No increased pain   Behavior During Therapy WFL for tasks assessed/performed      Past Medical History:  Diagnosis Date  . Acute renal failure (HCC) 04/17/2013  . Anemia, iron deficiency 09/09/2013  . Anxiety   . Arthritis   . 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 Coronary artery disease    IN 2000   STENT PLACED IN 2012 sees Dr. Dietrich Pates, saw last 2013  . Disorder of blood    BEEN TREATED BY DERMATOLOGIST X 4 YRS..."BLOOD BLISTERS"  . Gout   . HTN (hypertension)    sees [name] in [place]  . Hx MRSA infection    rt shoulder  . Hyperlipidemia   . Pneumonia    "I've had it 3-4 times"  . Small bowel obstruction (HCC)   . Small bowel problem    HAD ALOT OF SCAR TISSUE FROM PREVIOUS SURGERIES..NG WAS INSERTED ...Marland KitchenMarland KitchenNO SURGERY NEEDED...Marland KitchenMarland KitchenIN FOR 8 DAYS  . Ventral hernia     Past Surgical History:  Procedure Laterality Date  . ANTERIOR CERVICAL CORPECTOMY  12/17/11  . ANTERIOR CERVICAL CORPECTOMY  12/17/2011   Procedure: ANTERIOR CERVICAL CORPECTOMY;  Surgeon: Karn Cassis, MD;  Location: [facility];  Service: Neurosurgery;  Laterality: N/A;   Anterior Cervical Decompression Fusion Five to Thoracic Two with plating  . BACK SURGERY     lumbar  . CATARACT EXTRACTION W/ INTRAOCULAR LENS  IMPLANT, BILATERAL  ? 2011  . CHOLECYSTECTOMY  2006 "or after"  . CORONARY ANGIOPLASTY WITH STENT PLACEMENT  2012  . CORONARY ARTERY BYPASS GRAFT  2000   CABG X5  . EYE SURGERY     bilateral cataract  . INCISION AND DRAINAGE OF WOUND  ~ 20ll; 12/03/11   "had infection in my right"  . LUMBAR LAMINECTOMY/DECOMPRESSION MICRODISCECTOMY  06/13/2011   Procedure: LUMBAR LAMINECTOMY/DECOMPRESSION MICRODISCECTOMY;  Surgeon: Karn Cassis;  Location: [facility];  Service: Neurosurgery;  Laterality: N/A;  Lumbar three, lumbar four-five Laminectomy  . LUMBAR LAMINECTOMY/DECOMPRESSION MICRODISCECTOMY Right 06/17/2013   Procedure: LUMBAR LAMINECTOMY/DECOMPRESSION MICRODISCECTOMY 1 LEVEL;  Surgeon: Karn Cassis, MD;  Location: [facility];  Service: Neurosurgery;  Laterality: Right;  Right L3-4 Microdiskectomy  . LUMBAR WOUND DEBRIDEMENT N/A 08/02/2013   Procedure: INCISION AND DRAINAGE OF LUMBAR WOUND DEBRIDEMENT;  Surgeon: Karn Cassis, MD;  Location: [facility];  Service: Neurosurgery;  Laterality: N/A;  . PERIPHERALLY INSERTED CENTRAL CATHETER INSERTION  2011 & 11/2011  . SHOULDER ARTHROSCOPY Left 09/13/2013   Procedure: ARTHROSCOPIC IRRIGATION AND DEBRIDEMENT, SYNOVECTOMY ,  LEFT SHOULDER ;  Surgeon: Dyke Brackett  Montez Hageman., MD;  Location: [facility];  Service: Orthopedics;  Laterality: Left;  . SHOULDER OPEN ROTATOR CUFF REPAIR  ~ 2011   right  . STERNAL SURG  2000   HAS HAD 5-6 ON HIS STERNUM; "caught MRSA in it"  . TONSILLECTOMY  1949    There were no vitals filed for this visit.      Subjective Assessment - 04/14/16 0904    Subjective Patient states that he is feeling good, his BP was a little high at (176/66) the MD this morning but he feels alright. His grand-daughter ran over his L foot in teh golf cart but it feels OK.    Pertinent History beta  blockers, CHF/CAD, HTN, history of lumbar laminectomy, CABG, history of ventral hernia, history of AVN B hips, history of cervical corpectomy, brace secondary to no sternum   Currently in Pain? Yes   Pain Score 3    Pain Location Back   Pain Orientation Left   Pain Descriptors / Indicators Discomfort   Pain Type Chronic pain   Pain Radiating Towards none    Pain Onset More than a month ago   Pain Frequency Constant   Aggravating Factors  none    Pain Relieving Factors nothing, goes away on its own    Effect of Pain on Daily Activities doesn't last very long, but when it does hit it is quite limiting                          [facility] Adult PT Treatment/Exercise - 04/14/16 0001      Lumbar Exercises: Standing   Forward Lunge 10 reps   Forward Lunge Limitations 6 inch box    Side Lunge 5 reps  L LE only, L knee block    Other Standing Lumbar Exercises unsupported standing in parallel bars with progressively decreasing BOS, L knee block      Lumbar Exercises: Supine   Bridge 15 reps   Bridge Limitations 2 second holds    Other Supine Lumbar Exercises supine hip ABD with red TB 1x15 B    Other Supine Lumbar Exercises SAQs with 2.5# 1x15 R, with 1.75# L      Lumbar Exercises: Prone   Straight Leg Raise 10 reps     Lumbar Exercises: Quadruped   Madcat/Old Horse 5 reps           Other Quadruped Lumbar Exercises tall kneeling balance activity                 PT Education - 04/14/16 0949    Education provided Yes   Education Details safety at home, recommended return to manufacturer of brace for repair    Person(s) Educated Patient   Methods Explanation   Comprehension Verbalized understanding          PT Short Term Goals - 04/07/16 1236      PT SHORT TERM GOAL #1   Title Patient to be able to ambulate at least 484ft with rollator and KAFO in order to demonstrate improvement in mobilty    Time 3   Period Weeks   Status On-going     PT SHORT TERM  GOAL #2   Title Patient to be able to tolerate CKC exercise with KAFO unlocked and minimal unsteadiness/instability in order to demonstrate improvement in condition    Time 3   Period Weeks   Status Achieved     PT SHORT TERM GOAL #3  Title Patient to be able to statically stand with KAFO locked and no UE support in order to demonstrate improved balance    Time 3   Period Weeks   Status On-going     PT SHORT TERM GOAL #4   Title Patient will  consistetly perform appropriate HEP correctly, to be updated PRN    Time 3   Period Weeks   Status Achieved           PT Long Term Goals - 04/07/16 1237      PT LONG TERM GOAL #1   Title Patient will demonstrate L LE strength at least 4-/5 and 5/5 muslce strength in all other tested groups in order to assist in improving gait and stablity    Time 6   Period Weeks   Status On-going     PT LONG TERM GOAL #2   Title Patient to be able to ambulate and perform functional mobility with AFO and rollator rather than KAFO in order to demonstrate improved strength and mobiltiy    Time 6   Period Weeks   Status On-going     PT LONG TERM GOAL #3   Title Patient to be able to complete TUG balance test in 18 seconds or less wtih rollator in order to demonstrate improved mobility and reduced fall risk    Time 6   Period Weeks   Status On-going     PT LONG TERM GOAL #4   Title Patient to be able to ambulate at least 677ft with  rollator during in order to demonstrate improved overall mobilty    Time 6   Period Weeks   Status On-going               Plan - 04/14/16 0950    Clinical Impression Statement Continued work with functional strengthening in supine and prone positions, also performed quadruped and then progressed to standing work without brace, min guard provided to prevent L knee blocking/for safety. Patient's brace appears to have lost a screw near the locking mechanism; educated patient to return to manufacturer of brace  for repair. While attempting functional sideways lunges, patient's L knee did buckle, however PT and PTA able to return patient to safety without harm to patient or patient touching the ground. Finished session with unsupported standing in parallel bars with L knee block.    Rehab Potential Good   PT Frequency 2x / week   PT Duration Other (comment)  increase to total of 16 weeks    PT Treatment/Interventions ADLs/Self Care Home Management;Electrical Stimulation;Gait training;Stair training;Functional mobility training;Therapeutic activities;Therapeutic exercise;Balance training;Neuromuscular re-education;Patient/family education;Manual techniques;Energy conservation;Taping   PT Next Visit Plan continue functional strength, try to progress out of brace for CKC strength; continue Nustep and balance activity. Gait in tall walking on mat table. Close guarding with CKC work out of brace due to risk of knee buckling.    Consulted and Agree with Plan of Care Patient      Patient will benefit from skilled therapeutic intervention in order to improve the following deficits and impairments:  Abnormal gait, Decreased activity tolerance, Decreased strength, Decreased balance, Decreased mobility, Difficulty walking, Improper body mechanics, Decreased coordination, Impaired flexibility, Postural dysfunction  Visit Diagnosis: Difficulty in walking, not elsewhere classified  Muscle weakness (generalized)  Unsteadiness on feet     Problem List Patient Active Problem List   Diagnosis Date Noted  . 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 Infection of urinary tract 10/26/2015  .  Intractable back pain 10/22/2015  . Back pain 10/22/2015  . Muscle weakness (generalized) 12/05/2013  . Tight fascia 12/05/2013  . Decreased range of motion of right shoulder 12/05/2013  . Decreased range of motion of left shoulder 12/05/2013  . Bilateral leg weakness 11/21/2013  . Poor balance 11/21/2013  .  Difficulty in walking(719.7) 11/21/2013  . Shoulder pain, left 09/12/2013  . Anemia, iron deficiency 09/09/2013  . Dyspnea 09/07/2013  . UTI (urinary tract infection) 09/07/2013  . Acute CHF (congestive heart failure) (HCC) 09/07/2013  . Elevated troponin 09/07/2013  . Fever and chills 09/06/2013  . Altered mental status 09/05/2013  . Neurogenic bowel 08/22/2013  . 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 Gout flare 08/22/2013  . Postoperative wound infection 08/22/2013  . Lumbar degenerative disc disease 08/02/2013  . Post-operative pain 07/29/2013  . Osteonecrosis (HCC) 05/05/2013  . Small bowel obstruction (HCC) 04/17/2013  . Acute renal failure (HCC) 04/17/2013  . Medial meniscus, posterior horn derangement 02/17/2013  . Knee sprain and strain 02/17/2013  . Trigger point with neck pain 03/18/2012  . Rotator cuff tear arthropathy of right shoulder 09/08/2011  . CAD in native artery 01/09/2011  . Ankle fracture 12/25/2010  . Contusion of shoulder, left 12/25/2010  . PEMPHIGUS VULGARIS 07/02/2010  . MRSA 05/13/2010  . PRURITUS 05/13/2010  . SPINAL STENOSIS, LUMBAR 05/13/2010  . HLD (hyperlipidemia) 04/24/2010  . GASTROESOPHAGEAL REFLUX DISEASE 04/24/2010  . VENTRAL HERNIA, INCISIONAL 04/24/2010  . DEGENERATIVE JOINT DISEASE 04/24/2010  . PYOGENIC ARTHRITIS, SHOULDER REGION 02/04/2010  . Essential hypertension 01/01/2010  . RUPTURE ROTATOR CUFF 02/19/2009    Nedra HaiKristen Nickole Adamek PT, DPT [phone]  [facility] [address] [place], [place], [address] Phone: [phone]   Fax:  [phone]  Name: [name] MRN: [id number] Date of Birth: 05/07/1941

## 2016-04-14 NOTE — Progress Notes (Signed)
SUBJECTIVE: The patient presents for followup for coronary artery disease and CABG as well as chronic systolic and diastolic heart failure.  EF 45% in 08/2013. He had a normal nuclear stress test in March 2014.  He denies chest pain, palpitations, shortness of breath and leg swelling. He walks with a walker.  He had back surgery and spent 2 months recovering at the [facility].   Review of Systems: As per "subjective", otherwise negative.  Allergies  Allergen Reactions  . Morphine Other (See Comments)    REACTION: made him go crazy; "I had fun w/it; my family didn't think it was too funny"  . Metoprolol Other (See Comments)    REACTION:  Tachycardia; "don't remember how bad it was; it was so long ago"  . Rifampin     Itch     Current Outpatient Prescriptions  Medication Sig Dispense Refill  . acetaminophen (TYLENOL) 325 MG tablet Take 1-2 tablets (325-650 mg total) by mouth every 4 (four) hours as needed for mild pain.    Marland Kitchen aspirin EC 81 MG tablet Take 81 mg by mouth daily.    Marland Kitchen atorvastatin (LIPITOR) 40 MG tablet TAKE 1 TABLET BY MOUTH EVERY DAY 30 tablet 6  . carvedilol (COREG) 25 MG tablet Take 1 tablet (25 mg total) by mouth 2 (two) times daily with a meal. 60 tablet 12  . cefUROXime (CEFTIN) 250 MG tablet Take 1 tablet (250 mg total) by mouth 2 (two) times daily with a meal. 14 tablet 0  . docusate sodium (COLACE) 100 MG capsule Take 100 mg by mouth daily.    Marland Kitchen doxazosin (CARDURA) 8 MG tablet Take 8 mg by mouth daily.    . finasteride (PROSCAR) 5 MG tablet Take 5 mg by mouth daily.    . Multiple Vitamin (MULTIVITAMIN) tablet Take 1 tablet by mouth daily.    . nitroGLYCERIN (NITROSTAT) 0.4 MG SL tablet Place 0.4 mg under the tongue every 5 (five) minutes as needed for chest pain. May take up to 3 doses per episode. Contact MD or 911 if unresolved chest pain continues.    . predniSONE (DELTASONE) 20 MG tablet Take 3 tablets (60 mg total) by mouth daily with breakfast.  100 tablet 0  . tamsulosin (FLOMAX) 0.4 MG CAPS capsule Take 1 capsule (0.4 mg total) by mouth daily after breakfast. 30 capsule 1   No current facility-administered medications for this visit.     Past Medical History:  Diagnosis Date  . Acute renal failure (HCC) 04/17/2013  . Anemia, iron deficiency 09/09/2013  . Anxiety   . Arthritis   . 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 Coronary artery disease    IN 2000   STENT PLACED IN 2012 sees Dr. Dietrich Pates, saw last 2013  . Disorder of blood    BEEN TREATED BY DERMATOLOGIST X 4 YRS..."BLOOD BLISTERS"  . Gout   . HTN (hypertension)    sees [name] in [place]  . Hx MRSA infection    rt shoulder  . Hyperlipidemia   . Pneumonia    "I've had it 3-4 times"  . Small bowel obstruction (HCC)   . Small bowel problem    HAD ALOT OF SCAR TISSUE FROM PREVIOUS SURGERIES..NG WAS INSERTED ...Marland KitchenMarland KitchenNO SURGERY NEEDED...Marland KitchenMarland KitchenIN FOR 8 DAYS  . Ventral hernia     Past Surgical History:  Procedure Laterality Date  . ANTERIOR CERVICAL CORPECTOMY  12/17/11  . ANTERIOR CERVICAL CORPECTOMY  12/17/2011   Procedure: ANTERIOR CERVICAL CORPECTOMY;  Surgeon: Karn Cassis, MD;  Location: [facility];  Service: Neurosurgery;  Laterality: N/A;  Anterior Cervical Decompression Fusion Five to Thoracic Two with plating  . BACK SURGERY     lumbar  . CATARACT EXTRACTION W/ INTRAOCULAR LENS  IMPLANT, BILATERAL  ? 2011  . CHOLECYSTECTOMY  2006 "or after"  . CORONARY ANGIOPLASTY WITH STENT PLACEMENT  2012  . CORONARY ARTERY BYPASS GRAFT  2000   CABG X5  . EYE SURGERY     bilateral cataract  . INCISION AND DRAINAGE OF WOUND  ~ 20ll; 12/03/11   "had infection in my right"  . LUMBAR LAMINECTOMY/DECOMPRESSION MICRODISCECTOMY  06/13/2011   Procedure: LUMBAR LAMINECTOMY/DECOMPRESSION MICRODISCECTOMY;  Surgeon: Karn Cassis;  [facility]: [facility];  Service: Neurosurgery;  Laterality: N/A;  Lumbar three, lumbar four-five Laminectomy  . LUMBAR  LAMINECTOMY/DECOMPRESSION MICRODISCECTOMY Right 06/17/2013   Procedure: LUMBAR LAMINECTOMY/DECOMPRESSION MICRODISCECTOMY 1 LEVEL;  Surgeon: Karn Cassis, MD;  [facility]: [facility];  Service: Neurosurgery;  Laterality: Right;  Right L3-4 Microdiskectomy  . LUMBAR WOUND DEBRIDEMENT N/A 08/02/2013   Procedure: INCISION AND DRAINAGE OF LUMBAR WOUND DEBRIDEMENT;  Surgeon: Karn Cassis, MD;  Location: [facility];  Service: Neurosurgery;  Laterality: N/A;  . PERIPHERALLY INSERTED CENTRAL CATHETER INSERTION  2011 & 11/2011  . SHOULDER ARTHROSCOPY Left 09/13/2013   Procedure: ARTHROSCOPIC IRRIGATION AND DEBRIDEMENT, SYNOVECTOMY ,  LEFT SHOULDER ;  Surgeon: Thera Flake., MD;  Location: [facility];  Service: Orthopedics;  Laterality: Left;  . SHOULDER OPEN ROTATOR CUFF REPAIR  ~ 2011   right  . STERNAL SURG  2000   HAS HAD 5-6 ON HIS STERNUM; "caught MRSA in it"  . TONSILLECTOMY  1949    Social History   Social History  . Marital status: Married    Spouse name: N/A  . Number of children: N/A  . Years of education: N/A   Occupational History  . Not on file.   Social History Main Topics  . Smoking status: Former Smoker    Packs/day: 1.00    Years: 1.00    Types: Cigarettes    Start date: 12/29/1959    Quit date: 12/27/1998  . Smokeless tobacco: Never Used  . Alcohol use No  . Drug use: No  . Sexual activity: Not Currently   Other Topics Concern  . Not on file   Social History Narrative  . No narrative on file     Vitals:   04/14/16 0822  BP: (!) 176/66  Pulse: 64  SpO2: 96%  Weight: 176 lb (79.8 kg)  Height: 6' (1.829 m)    PHYSICAL EXAM General: NAD HEENT: Normal. Neck: No JVD, no thyromegaly. Lungs: Clear to auscultation bilaterally with normal respiratory effort. CV: Nondisplaced PMI.  Regular rate and rhythm, normal S1/S2, no S3/S4, no murmur. No pretibial or periankle edema.  No carotid bruit.   Abdomen: Soft, nontender, no distention.  Neurologic: Alert and  oriented.  Psych: Normal affect.    ECG: Most recent ECG reviewed.      ASSESSMENT AND PLAN: 1. CAD/CABG: Stable ischemic heart disease. Normal nuclear stress test 10/19/12. Continue ASA, Lipitor, and Coreg.  2. Chronic systolic and diastolic heart failure: No evidence of volume overload, and not on diuretics. EF 45% on 09/08/13. No changes.  3. Essential HTN: Markedly elevated but reportedly normal at home. He will check it routinely and if it remains elevated, I will add losartan 25 mg.  4. Hyperlipidemia: Continue Lipitor.  Dispo: fu 1 year.  Prentice DockerSuresh Frank Pilger, M.D., F.A.C.C.

## 2016-04-14 NOTE — Addendum Note (Signed)
Addended by: Marlyn CorporalARLTON, CATHERINE A on: 04/14/2016 08:48 AM   Modules accepted: Orders

## 2016-04-15 DIAGNOSIS — N390 Urinary tract infection, site not specified: Secondary | ICD-10-CM | POA: Diagnosis not present

## 2016-04-17 ENCOUNTER — Ambulatory Visit (HOSPITAL_COMMUNITY): Payer: Medicare Other | Admitting: Physical Therapy

## 2016-04-17 DIAGNOSIS — M25611 Stiffness of right shoulder, not elsewhere classified: Secondary | ICD-10-CM | POA: Diagnosis not present

## 2016-04-17 DIAGNOSIS — R2681 Unsteadiness on feet: Secondary | ICD-10-CM | POA: Diagnosis not present

## 2016-04-17 DIAGNOSIS — M6281 Muscle weakness (generalized): Secondary | ICD-10-CM

## 2016-04-17 DIAGNOSIS — R262 Difficulty in walking, not elsewhere classified: Secondary | ICD-10-CM

## 2016-04-17 DIAGNOSIS — R2689 Other abnormalities of gait and mobility: Secondary | ICD-10-CM | POA: Diagnosis not present

## 2016-04-17 DIAGNOSIS — R29898 Other symptoms and signs involving the musculoskeletal system: Secondary | ICD-10-CM

## 2016-04-17 DIAGNOSIS — M629 Disorder of muscle, unspecified: Secondary | ICD-10-CM | POA: Diagnosis not present

## 2016-04-17 NOTE — Therapy (Signed)
[facility] [address] [place], [place], [address] Phone: [phone]   Fax:  [phone]  Physical Therapy Treatment  Patient Details  Name: [name] MRN: [id number] Date of Birth: 11/11/1940 Referring Provider: Kari BaarsEdward Hawkins   Encounter Date: 04/17/2016      PT End of Session - 04/17/16 0941    Visit Number 18   Number of Visits 32   Date for PT Re-Evaluation 05/07/16   Authorization Type Medicare/[employer]    Authorization Time Period 9/11/20017-06/06/2016   g code done on visit 16   Authorization - Visit Number 18   Authorization - Number of Visits 20   PT Start Time 0903   PT Stop Time 0944   PT Time Calculation (min) 41 min   Equipment Utilized During Treatment Gait belt   Activity Tolerance Patient tolerated treatment well;No increased pain   Behavior During Therapy WFL for tasks assessed/performed      Past Medical History:  Diagnosis Date  . Acute renal failure (HCC) 04/17/2013  . Anemia, iron deficiency 09/09/2013  . Anxiety   . Arthritis   . 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 Coronary artery disease    IN 2000   STENT PLACED IN 2012 sees Dr. Dietrich Patesothbart, saw last 2013  . Disorder of blood    BEEN TREATED BY DERMATOLOGIST X 4 YRS..."BLOOD BLISTERS"  . Gout   . HTN (hypertension)    [place]. [place]Hawkins [place]  . Hx MRSA infection    rt shoulder  . Hyperlipidemia   . Pneumonia    "I've had it 3-4 times"  . Small bowel obstruction (HCC)   . Small bowel problem    HAD ALOT OF SCAR TISSUE FROM PREVIOUS SURGERIES..NG WAS INSERTED ...Marland Kitchen.Marland Kitchen.NO SURGERY NEEDED...Marland Kitchen.Marland Kitchen.IN FOR 8 DAYS  . Ventral hernia     Past Surgical History:  Procedure Laterality Date  . ANTERIOR CERVICAL CORPECTOMY  12/17/11  . ANTERIOR CERVICAL CORPECTOMY  12/17/2011   Procedure: ANTERIOR CERVICAL CORPECTOMY;  Surgeon: Karn CassisErnesto M Botero, MD;  Location: [facility];  Service: Neurosurgery;  Laterality: N/A;   Anterior Cervical Decompression Fusion Five to Thoracic Two with plating  . BACK SURGERY     lumbar  . CATARACT EXTRACTION W/ INTRAOCULAR LENS  IMPLANT, BILATERAL  ? 2011  . CHOLECYSTECTOMY  2006 "or after"  . CORONARY ANGIOPLASTY WITH STENT PLACEMENT  2012  . CORONARY ARTERY BYPASS GRAFT  2000   CABG X5  . EYE SURGERY     bilateral cataract  . INCISION AND DRAINAGE OF WOUND  ~ 20ll; 12/03/11   "had infection in my right"  . LUMBAR LAMINECTOMY/DECOMPRESSION MICRODISCECTOMY  06/13/2011   Procedure: LUMBAR LAMINECTOMY/DECOMPRESSION MICRODISCECTOMY;  Surgeon: Karn CassisErnesto M Botero;  Location: [facility];  Service: Neurosurgery;  Laterality: N/A;  Lumbar three, lumbar four-five Laminectomy  . LUMBAR LAMINECTOMY/DECOMPRESSION MICRODISCECTOMY Right 06/17/2013   Procedure: LUMBAR LAMINECTOMY/DECOMPRESSION MICRODISCECTOMY 1 LEVEL;  Surgeon: Karn CassisErnesto M Botero, MD;  Location: [facility];  Service: Neurosurgery;  Laterality: Right;  Right L3-4 Microdiskectomy  . LUMBAR WOUND DEBRIDEMENT N/A 08/02/2013   Procedure: INCISION AND DRAINAGE OF LUMBAR WOUND DEBRIDEMENT;  Surgeon: Karn CassisErnesto M Botero, MD;  Location: [facility];  Service: Neurosurgery;  Laterality: N/A;  . PERIPHERALLY INSERTED CENTRAL CATHETER INSERTION  2011 & 11/2011  . SHOULDER ARTHROSCOPY Left 09/13/2013   Procedure: ARTHROSCOPIC IRRIGATION AND DEBRIDEMENT, SYNOVECTOMY ,  LEFT SHOULDER ;  Surgeon: Dyke BrackettW D Caffrey  Montez Hageman., MD;  Location: [facility];  Service: Orthopedics;  Laterality: Left;  . SHOULDER OPEN ROTATOR CUFF REPAIR  ~ 2011   right  . STERNAL SURG  2000   HAS HAD 5-6 ON HIS STERNUM; "caught MRSA in it"  . TONSILLECTOMY  1949    There were no vitals filed for this visit.      Subjective Assessment - 04/17/16 0909    Subjective Pt states that he has been doing his exercises    Pertinent History beta blockers, CHF/CAD, HTN, history of lumbar laminectomy, CABG, history of ventral hernia, history of AVN B hips, history of cervical corpectomy,  brace secondary to no sternum   How long can you sit comfortably? no problem    How long can you stand comfortably? 10 minutes max   How long can you walk comfortably? walking with rollator for less than five minutes at a time    Patient Stated Goals get leg working better, get walking    Currently in Pain? Yes   Pain Score 2    Pain Location Back   Pain Orientation Left   Pain Descriptors / Indicators Sore   Pain Type Chronic pain   Pain Onset More than a month ago   Pain Frequency Intermittent   Aggravating Factors  weight bearing                          [facility] Adult PT Treatment/Exercise - 04/17/16 0001      Lumbar Exercises: Stretches   Prone Mid Back Stretch 3 reps;30 seconds   Quad Stretch 3 reps;30 seconds     Lumbar Exercises: Standing   Other Standing Lumbar Exercises unsupported standing x 5; Rt foot to the Rt; Lt foot to the LT x 10      Lumbar Exercises: Seated   Long Arc Quad on Programme researcher, broadcasting/film/video;Both   LAQ on Chair Weights (lbs) 2# on Lt ; 5# on RT    Sit to Stand 10 reps     Lumbar Exercises: Sidelying   Hip Abduction 10 reps     Lumbar Exercises: Prone   Straight Leg Raise 10 reps   Straight Leg Raises Limitations Lt with knee bent to 90 degrees;    Other Prone Lumbar Exercises heel squeeze bilateral knee flexion Bilateral    Other Prone Lumbar Exercises Tall kneelingx 3 with assist; crawling x 3 reps                   PT Short Term Goals - 04/07/16 1236      PT SHORT TERM GOAL #1   Title Patient to be able to ambulate at least 448ft with rollator and KAFO in order to demonstrate improvement in mobilty    Time 3   Period Weeks   Status On-going     PT SHORT TERM GOAL #2   Title Patient to be able to tolerate CKC exercise with KAFO unlocked and minimal unsteadiness/instability in order to demonstrate improvement in condition    Time 3   Period Weeks   Status Achieved     PT SHORT TERM GOAL #3   Title Patient to be  able to statically stand with KAFO locked and no UE support in order to demonstrate improved balance    Time 3   Period Weeks   Status On-going     PT SHORT TERM GOAL #4   Title Patient will  consistetly perform appropriate HEP correctly,  to be updated PRN    Time 3   Period Weeks   Status Achieved           PT Long Term Goals - 04/07/16 1237      PT LONG TERM GOAL #1   Title Patient will demonstrate L LE strength at least 4-/5 and 5/5 muslce strength in all other tested groups in order to assist in improving gait and stablity    Time 6   Period Weeks   Status On-going     PT LONG TERM GOAL #2   Title Patient to be able to ambulate and perform functional mobility with AFO and rollator rather than KAFO in order to demonstrate improved strength and mobiltiy    Time 6   Period Weeks   Status On-going     PT LONG TERM GOAL #3   Title Patient to be able to complete TUG balance test in 18 seconds or less wtih rollator in order to demonstrate improved mobility and reduced fall risk    Time 6   Period Weeks   Status On-going     PT LONG TERM GOAL #4   Title Patient to be able to ambulate at least 661ft with  rollator during in order to demonstrate improved overall mobilty    Time 6   Period Weeks   Status On-going               Plan - 04/17/16 0941    Clinical Impression Statement Session focused on both strengthening and balance.  Pt needs assist with his Lt LE during exercises to keep in proper form.  Pt was only able to tolerate tall kneeling postition for five seconds at a time.  Pt fatigued after treatment.   Rehab Potential Good   PT Frequency 2x / week   PT Duration Other (comment)  increase to total of 16 weeks    PT Treatment/Interventions ADLs/Self Care Home Management;Electrical Stimulation;Gait training;Stair training;Functional mobility training;Therapeutic activities;Therapeutic exercise;Balance training;Neuromuscular re-education;Patient/family  education;Manual techniques;Energy conservation;Taping   PT Next Visit Plan Progress tall kneeling tolerance.  Once pt is able to hold for 30 seconds begin tall kneeling walking.     Consulted and Agree with Plan of Care Patient      Patient will benefit from skilled therapeutic intervention in order to improve the following deficits and impairments:  Abnormal gait, Decreased activity tolerance, Decreased strength, Decreased balance, Decreased mobility, Difficulty walking, Improper body mechanics, Decreased coordination, Impaired flexibility, Postural dysfunction  Visit Diagnosis: Difficulty in walking, not elsewhere classified  Muscle weakness (generalized)  Unsteadiness on feet  Bilateral leg weakness     Problem List Patient Active Problem List   Diagnosis Date Noted  . 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 Infection of urinary tract 10/26/2015  . Intractable back pain 10/22/2015  . Back pain 10/22/2015  . Muscle weakness (generalized) 12/05/2013  . Tight fascia 12/05/2013  . Decreased range of motion of right shoulder 12/05/2013  . Decreased range of motion of left shoulder 12/05/2013  . Bilateral leg weakness 11/21/2013  . Poor balance 11/21/2013  . Difficulty in walking(719.7) 11/21/2013  . Shoulder pain, left 09/12/2013  . Anemia, iron deficiency 09/09/2013  . Dyspnea 09/07/2013  . UTI (urinary tract infection) 09/07/2013  . Acute CHF (congestive heart failure) (HCC) 09/07/2013  . Elevated troponin 09/07/2013  . Fever and chills 09/06/2013  . Altered mental status 09/05/2013  . Neurogenic bowel 08/22/2013  . 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 Gout flare 08/22/2013  . Postoperative wound infection 08/22/2013  . Lumbar degenerative disc disease 08/02/2013  . Post-operative pain 07/29/2013  . Osteonecrosis (HCC) 05/05/2013  . Small bowel obstruction (HCC) 04/17/2013  . Acute renal failure (HCC) 04/17/2013  . Medial  meniscus, posterior horn derangement 02/17/2013  . Knee sprain and strain 02/17/2013  . Trigger point with neck pain 03/18/2012  . Rotator cuff tear arthropathy of right shoulder 09/08/2011  . CAD in native artery 01/09/2011  . Ankle fracture 12/25/2010  . Contusion of shoulder, left 12/25/2010  . PEMPHIGUS VULGARIS 07/02/2010  . MRSA 05/13/2010  . PRURITUS 05/13/2010  . SPINAL STENOSIS, LUMBAR 05/13/2010  . HLD (hyperlipidemia) 04/24/2010  . GASTROESOPHAGEAL REFLUX DISEASE 04/24/2010  . VENTRAL HERNIA, INCISIONAL 04/24/2010  . DEGENERATIVE JOINT DISEASE 04/24/2010  . PYOGENIC ARTHRITIS, SHOULDER REGION 02/04/2010  . Essential hypertension 01/01/2010  . RUPTURE ROTATOR CUFF 02/19/2009  Virgina Organ, PT CLT [phone] 04/17/2016, 9:47 AM  [facility] [address] [place], [place], [address] Phone: [phone]   Fax:  [phone]  Name: [name] MRN: [id number] Date of Birth: 1941/06/27

## 2016-04-21 ENCOUNTER — Ambulatory Visit (HOSPITAL_COMMUNITY): Payer: Medicare Other | Admitting: Physical Therapy

## 2016-04-21 DIAGNOSIS — M25611 Stiffness of right shoulder, not elsewhere classified: Secondary | ICD-10-CM | POA: Diagnosis not present

## 2016-04-21 DIAGNOSIS — R2681 Unsteadiness on feet: Secondary | ICD-10-CM | POA: Diagnosis not present

## 2016-04-21 DIAGNOSIS — M6281 Muscle weakness (generalized): Secondary | ICD-10-CM | POA: Diagnosis not present

## 2016-04-21 DIAGNOSIS — R262 Difficulty in walking, not elsewhere classified: Secondary | ICD-10-CM | POA: Diagnosis not present

## 2016-04-21 DIAGNOSIS — M629 Disorder of muscle, unspecified: Secondary | ICD-10-CM | POA: Diagnosis not present

## 2016-04-21 DIAGNOSIS — R2689 Other abnormalities of gait and mobility: Secondary | ICD-10-CM | POA: Diagnosis not present

## 2016-04-21 NOTE — Therapy (Signed)
[facility] [address] [place], [place], [address] Phone: [phone]   Fax:  [phone]  Physical Therapy Treatment  Patient Details  Name: [name] MRN: [id number] Date of Birth: 04/16/1941 Referring Provider: [name]   Encounter Date: 04/21/2016      PT End of Session - 04/21/16 0901    Visit Number 19   Number of Visits 32   Date for PT Re-Evaluation 05/07/16   Authorization Type Medicare/[employer]    Authorization Time Period 9/11/20017-06/06/2016   g code done on visit 16   Authorization - Visit Number 19   Authorization - Number of Visits 26   PT Start Time 0818   PT Stop Time 0858   PT Time Calculation (min) 40 min   Equipment Utilized During Treatment Gait belt   Activity Tolerance Patient tolerated treatment well   Behavior During Therapy [facility] for tasks assessed/performed      Past Medical History:  Diagnosis Date  . Acute renal failure (HCC) 04/17/2013  . Anemia, iron deficiency 09/09/2013  . Anxiety   . Arthritis   . 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 Coronary artery disease    IN 2000   STENT PLACED IN 2012 sees Dr. Dietrich Pates, saw last 2013  . Disorder of blood    BEEN TREATED BY DERMATOLOGIST X 4 YRS..."BLOOD BLISTERS"  . Gout   . HTN (hypertension)    sees [name] in [place]  . Hx MRSA infection    rt shoulder  . Hyperlipidemia   . Pneumonia    "I've had it 3-4 times"  . Small bowel obstruction (HCC)   . Small bowel problem    HAD ALOT OF SCAR TISSUE FROM PREVIOUS SURGERIES..NG WAS INSERTED ...Marland KitchenMarland KitchenNO SURGERY NEEDED...Marland KitchenMarland KitchenIN FOR 8 DAYS  . Ventral hernia     Past Surgical History:  Procedure Laterality Date  . ANTERIOR CERVICAL CORPECTOMY  12/17/11  . ANTERIOR CERVICAL CORPECTOMY  12/17/2011   Procedure: ANTERIOR CERVICAL CORPECTOMY;  Surgeon: Karn Cassis, MD;  [facility]: [facility];  Service: Neurosurgery;  Laterality: N/A;  Anterior Cervical  Decompression Fusion Five to Thoracic Two with plating  . BACK SURGERY     lumbar  . CATARACT EXTRACTION W/ INTRAOCULAR LENS  IMPLANT, BILATERAL  ? 2011  . CHOLECYSTECTOMY  2006 "or after"  . CORONARY ANGIOPLASTY WITH STENT PLACEMENT  2012  . CORONARY ARTERY BYPASS GRAFT  2000   CABG X5  . EYE SURGERY     bilateral cataract  . INCISION AND DRAINAGE OF WOUND  ~ 20ll; 12/03/11   "had infection in my right"  . LUMBAR LAMINECTOMY/DECOMPRESSION MICRODISCECTOMY  06/13/2011   Procedure: LUMBAR LAMINECTOMY/DECOMPRESSION MICRODISCECTOMY;  Surgeon: Karn Cassis;  Location: [facility];  Service: Neurosurgery;  Laterality: N/A;  Lumbar three, lumbar four-five Laminectomy  . LUMBAR LAMINECTOMY/DECOMPRESSION MICRODISCECTOMY Right 06/17/2013   Procedure: LUMBAR LAMINECTOMY/DECOMPRESSION MICRODISCECTOMY 1 LEVEL;  Surgeon: Karn Cassis, MD;  [facility]: [facility];  Service: Neurosurgery;  Laterality: Right;  Right L3-4 Microdiskectomy  . LUMBAR WOUND DEBRIDEMENT N/A 08/02/2013   Procedure: INCISION AND DRAINAGE OF LUMBAR WOUND DEBRIDEMENT;  Surgeon: Karn Cassis, MD;  Location: [facility];  Service: Neurosurgery;  Laterality: N/A;  . PERIPHERALLY INSERTED CENTRAL CATHETER INSERTION  2011 & 11/2011  . SHOULDER ARTHROSCOPY Left 09/13/2013   Procedure: ARTHROSCOPIC IRRIGATION AND DEBRIDEMENT, SYNOVECTOMY ,  LEFT SHOULDER ;  Surgeon: Thera Flake., MD;  Location: [facility];  Service: Orthopedics;  Laterality: Left;  . SHOULDER OPEN ROTATOR CUFF REPAIR  ~ 2011   right  . STERNAL SURG  2000   HAS HAD 5-6 ON HIS STERNUM; "caught MRSA in it"  . TONSILLECTOMY  1949    There were no vitals filed for this visit.      Subjective Assessment - 04/21/16 0820    Subjective Patient arrives stating he is doing well, no major changes since last session and no pain today    Pertinent History beta blockers, CHF/CAD, HTN, history of lumbar laminectomy, CABG, history of ventral hernia, history of AVN B hips,  history of cervical corpectomy, brace secondary to no sternum   Patient Stated Goals get leg working better, get walking    Currently in Pain? Yes   Pain Score 2    Pain Location Back   Pain Orientation Right   Pain Descriptors / Indicators Sore   Pain Type Chronic pain   Pain Radiating Towards down L LE    Pain Onset More than a month ago   Pain Frequency Intermittent   Aggravating Factors  weight bearing    Pain Relieving Factors nothing    Effect of Pain on Daily Activities doesn't last very long but when it happens it can be very limiting                          [facility] Adult PT Treatment/Exercise - 04/21/16 0001      Lumbar Exercises: Standing   Other Standing Lumbar Exercises unsupported standing with min guard, some posterior lean noted     Lumbar Exercises: Seated   Long Arc Quad on Chair Strengthening;Both;1 set;15 reps   LAQ on Chair Weights (lbs) 2# on Lt ; 5# on RT   2.5# seconds set L LE    Sit to Stand 10 reps   Sit to Stand Limitations U UEs, brace, min guard; twisting of trunk noted due to fatigue      Lumbar Exercises: Supine   Bridge 15 reps   Bridge Limitations 2 second holds    Other Supine Lumbar Exercises supine hip ABD with red TB 1x15 B    Other Supine Lumbar Exercises SAQs with 2.5# 1x15 R, with 1.75# L      Lumbar Exercises: Prone   Straight Leg Raise 15 reps   Straight Leg Raises Limitations Lt with knee bent to 90 degrees;    Other Prone Lumbar Exercises heel squeeze bilateral knee flexion Bilateral    Other Prone Lumbar Exercises Tall kneelingx 3 with assist; crawling x 3 reps      Lumbar Exercises: Quadruped   Other Quadruped Lumbar Exercises hip walks at edge of mat table x3                 PT Education - 04/21/16 0901    Education provided No          PT Short Term Goals - 04/07/16 1236      PT SHORT TERM GOAL #1   Title Patient to be able to ambulate at least 41ft with rollator and KAFO in order to  demonstrate improvement in mobilty    Time 3   Period Weeks   Status On-going     PT SHORT TERM GOAL #2   Title Patient to be able to tolerate CKC exercise with KAFO unlocked and minimal unsteadiness/instability in order to demonstrate improvement in condition  Time 3   Period Weeks   Status Achieved     PT SHORT TERM GOAL #3   Title Patient to be able to statically stand with KAFO locked and no UE support in order to demonstrate improved balance    Time 3   Period Weeks   Status On-going     PT SHORT TERM GOAL #4   Title Patient will  consistetly perform appropriate HEP correctly, to be updated PRN    Time 3   Period Weeks   Status Achieved           PT Long Term Goals - 04/07/16 1237      PT LONG TERM GOAL #1   Title Patient will demonstrate L LE strength at least 4-/5 and 5/5 muslce strength in all other tested groups in order to assist in improving gait and stablity    Time 6   Period Weeks   Status On-going     PT LONG TERM GOAL #2   Title Patient to be able to ambulate and perform functional mobility with AFO and rollator rather than KAFO in order to demonstrate improved strength and mobiltiy    Time 6   Period Weeks   Status On-going     PT LONG TERM GOAL #3   Title Patient to be able to complete TUG balance test in 18 seconds or less wtih rollator in order to demonstrate improved mobility and reduced fall risk    Time 6   Period Weeks   Status On-going     PT LONG TERM GOAL #4   Title Patient to be able to ambulate at least 680ft with  rollator during in order to demonstrate improved overall mobilty    Time 6   Period Weeks   Status On-going               Plan - 04/21/16 0902    Clinical Impression Statement Continued focus on functional strengthening today, primarily on mat table however also worked on standing tolerance in unsupported position as well. Patient continues to be challenged by a variety of exercises/positions on table,  however does continue to be limited by functional weakness in his core with tall kneeling based activities, did show improvement in crawling based tasks this session as well.  Fatigue noted in trunk and LE musculature as well as evidenced by twisting motion with sit to stands as fatigue increased. Some mild posterior lean noted with unsupported standing but patient mostly able to self-correct with verbal cues.    Rehab Potential Good   PT Frequency 2x / week   PT Duration Other (comment)  increase to total of 16 weeks    PT Treatment/Interventions ADLs/Self Care Home Management;Electrical Stimulation;Gait training;Stair training;Functional mobility training;Therapeutic activities;Therapeutic exercise;Balance training;Neuromuscular re-education;Patient/family education;Manual techniques;Energy conservation;Taping   PT Next Visit Plan Progress tall kneeling tolerance.  Once pt is able to hold for 30 seconds begin tall kneeling walking.     Consulted and Agree with Plan of Care Patient      Patient will benefit from skilled therapeutic intervention in order to improve the following deficits and impairments:  Abnormal gait, Decreased activity tolerance, Decreased strength, Decreased balance, Decreased mobility, Difficulty walking, Improper body mechanics, Decreased coordination, Impaired flexibility, Postural dysfunction  Visit Diagnosis: Difficulty in walking, not elsewhere classified  Muscle weakness (generalized)  Unsteadiness on feet     Problem List Patient Active Problem List   Diagnosis Date Noted  . Cauda equina spinal cord injury (  HCC) 10/26/2015  . Infection of urinary tract 10/26/2015  . Intractable back pain 10/22/2015  . Back pain 10/22/2015  . Muscle weakness (generalized) 12/05/2013  . Tight fascia 12/05/2013  . Decreased range of motion of right shoulder 12/05/2013  . Decreased range of motion of left shoulder 12/05/2013  . Bilateral leg weakness 11/21/2013  . Poor  balance 11/21/2013  . Difficulty in walking(719.7) 11/21/2013  . Shoulder pain, left 09/12/2013  . Anemia, iron deficiency 09/09/2013  . Dyspnea 09/07/2013  . UTI (urinary tract infection) 09/07/2013  . Acute CHF (congestive heart failure) (HCC) 09/07/2013  . Elevated troponin 09/07/2013  . Fever and chills 09/06/2013  . Altered mental status 09/05/2013  . Neurogenic bowel 08/22/2013  . 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 Gout flare 08/22/2013  . Postoperative wound infection 08/22/2013  . Lumbar degenerative disc disease 08/02/2013  . Post-operative pain 07/29/2013  . Osteonecrosis (HCC) 05/05/2013  . Small bowel obstruction (HCC) 04/17/2013  . Acute renal failure (HCC) 04/17/2013  . Medial meniscus, posterior horn derangement 02/17/2013  . Knee sprain and strain 02/17/2013  . Trigger point with neck pain 03/18/2012  . Rotator cuff tear arthropathy of right shoulder 09/08/2011  . CAD in native artery 01/09/2011  . Ankle fracture 12/25/2010  . Contusion of shoulder, left 12/25/2010  . PEMPHIGUS VULGARIS 07/02/2010  . MRSA 05/13/2010  . PRURITUS 05/13/2010  . SPINAL STENOSIS, LUMBAR 05/13/2010  . HLD (hyperlipidemia) 04/24/2010  . GASTROESOPHAGEAL REFLUX DISEASE 04/24/2010  . VENTRAL HERNIA, INCISIONAL 04/24/2010  . DEGENERATIVE JOINT DISEASE 04/24/2010  . PYOGENIC ARTHRITIS, SHOULDER REGION 02/04/2010  . Essential hypertension 01/01/2010  . RUPTURE ROTATOR CUFF 02/19/2009    Nedra HaiKristen Unger PT, DPT [phone]  [facility] [address] [place], [place], [address] Phone: [phone]   Fax:  [phone]  Name: [name] MRN: [id number] Date of Birth: 06/23/1941

## 2016-04-24 ENCOUNTER — Ambulatory Visit (HOSPITAL_COMMUNITY): Payer: Medicare Other

## 2016-04-24 DIAGNOSIS — R2689 Other abnormalities of gait and mobility: Secondary | ICD-10-CM | POA: Diagnosis not present

## 2016-04-24 DIAGNOSIS — R2681 Unsteadiness on feet: Secondary | ICD-10-CM | POA: Diagnosis not present

## 2016-04-24 DIAGNOSIS — M25611 Stiffness of right shoulder, not elsewhere classified: Secondary | ICD-10-CM

## 2016-04-24 DIAGNOSIS — M25612 Stiffness of left shoulder, not elsewhere classified: Secondary | ICD-10-CM

## 2016-04-24 DIAGNOSIS — R262 Difficulty in walking, not elsewhere classified: Secondary | ICD-10-CM

## 2016-04-24 DIAGNOSIS — M629 Disorder of muscle, unspecified: Secondary | ICD-10-CM | POA: Diagnosis not present

## 2016-04-24 DIAGNOSIS — M6281 Muscle weakness (generalized): Secondary | ICD-10-CM

## 2016-04-24 DIAGNOSIS — M6289 Other specified disorders of muscle: Secondary | ICD-10-CM

## 2016-04-24 DIAGNOSIS — G834 Cauda equina syndrome: Secondary | ICD-10-CM

## 2016-04-24 DIAGNOSIS — R29898 Other symptoms and signs involving the musculoskeletal system: Secondary | ICD-10-CM

## 2016-04-24 NOTE — Therapy (Signed)
[facility] [address] [place], [place], [address] Phone: [phone]   Fax:  [phone]  Physical Therapy Treatment  Patient Details  Name: [name] MRN: [id number] Date of Birth: 1941/01/14 Referring Provider: [name]  Encounter Date: 04/24/2016      PT End of Session - 04/24/16 0857    Visit Number 20   Number of Visits 32   Date for PT Re-Evaluation 05/07/16   Authorization Type Medicare/[employer]    Authorization Time Period 9/11/20017-06/06/2016   g code done on visit 16   Authorization - Visit Number 20   Authorization - Number of Visits 26   PT Start Time 0814   PT Stop Time 0904   PT Time Calculation (min) 50 min   Equipment Utilized During Treatment Other (comment)  TB and ankle weights   Activity Tolerance Patient tolerated treatment well   Behavior During Therapy [facility] for tasks assessed/performed      Past Medical History:  Diagnosis Date  . Acute renal failure (HCC) 04/17/2013  . Anemia, iron deficiency 09/09/2013  . Anxiety   . Arthritis   . 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 Coronary artery disease    IN 2000   STENT PLACED IN 2012 sees Dr. Dietrich Pates, saw last 2013  . Disorder of blood    BEEN TREATED BY DERMATOLOGIST X 4 YRS..."BLOOD BLISTERS"  . Gout   . HTN (hypertension)    sees [name] in [place]  . Hx MRSA infection    rt shoulder  . Hyperlipidemia   . Pneumonia    "I've had it 3-4 times"  . Small bowel obstruction (HCC)   . Small bowel problem    HAD ALOT OF SCAR TISSUE FROM PREVIOUS SURGERIES..NG WAS INSERTED ...Marland KitchenMarland KitchenNO SURGERY NEEDED...Marland KitchenMarland KitchenIN FOR 8 DAYS  . Ventral hernia     Past Surgical History:  Procedure Laterality Date  . ANTERIOR CERVICAL CORPECTOMY  12/17/11  . ANTERIOR CERVICAL CORPECTOMY  12/17/2011   Procedure: ANTERIOR CERVICAL CORPECTOMY;  Surgeon: Karn Cassis, MD;  Location: [facility];  Service: Neurosurgery;  Laterality:  N/A;  Anterior Cervical Decompression Fusion Five to Thoracic Two with plating  . BACK SURGERY     lumbar  . CATARACT EXTRACTION W/ INTRAOCULAR LENS  IMPLANT, BILATERAL  ? 2011  . CHOLECYSTECTOMY  2006 "or after"  . CORONARY ANGIOPLASTY WITH STENT PLACEMENT  2012  . CORONARY ARTERY BYPASS GRAFT  2000   CABG X5  . EYE SURGERY     bilateral cataract  . INCISION AND DRAINAGE OF WOUND  ~ 20ll; 12/03/11   "had infection in my right"  . LUMBAR LAMINECTOMY/DECOMPRESSION MICRODISCECTOMY  06/13/2011   Procedure: LUMBAR LAMINECTOMY/DECOMPRESSION MICRODISCECTOMY;  Surgeon: Karn Cassis;  Location: [facility];  Service: Neurosurgery;  Laterality: N/A;  Lumbar three, lumbar four-five Laminectomy  . LUMBAR LAMINECTOMY/DECOMPRESSION MICRODISCECTOMY Right 06/17/2013   Procedure: LUMBAR LAMINECTOMY/DECOMPRESSION MICRODISCECTOMY 1 LEVEL;  Surgeon: Karn Cassis, MD;  Location: [facility];  Service: Neurosurgery;  Laterality: Right;  Right L3-4 Microdiskectomy  . LUMBAR WOUND DEBRIDEMENT N/A 08/02/2013   Procedure: INCISION AND DRAINAGE OF LUMBAR WOUND DEBRIDEMENT;  Surgeon: Karn Cassis, MD;  Location: [facility];  Service: Neurosurgery;  Laterality: N/A;  . PERIPHERALLY INSERTED CENTRAL CATHETER INSERTION  2011 & 11/2011  . SHOULDER ARTHROSCOPY Left 09/13/2013   Procedure: ARTHROSCOPIC IRRIGATION AND DEBRIDEMENT, SYNOVECTOMY ,  LEFT SHOULDER ;  Surgeon: Lacretia Nicks  D Carloyn Manneraffrey Jr., MD;  Location: [facility];  Service: Orthopedics;  Laterality: Left;  . SHOULDER OPEN ROTATOR CUFF REPAIR  ~ 2011   right  . STERNAL SURG  2000   HAS HAD 5-6 ON HIS STERNUM; "caught MRSA in it"  . TONSILLECTOMY  1949    There were no vitals filed for this visit.      Subjective Assessment - 04/24/16 0816    Subjective Pt arrived using his rollator walker, and L LE KAFO, which was doffed for mat exercises today.  Pt reports no falls or close calls.  Pt states he feels like he is getting stonger.     Pertinent History beta  blockers, CHF/CAD, HTN, history of lumbar laminectomy, CABG, history of ventral hernia, history of AVN B hips, history of cervical corpectomy, brace secondary to no sternum   How long can you sit comfortably? no problem    How long can you stand comfortably? 10 minutes max   How long can you walk comfortably? walking with rollator for less than five minutes at a time    Patient Stated Goals get leg working better, get walking    Currently in Pain? Yes   Pain Score 2    Pain Location Back   Pain Orientation Right   Pain Descriptors / Indicators Sore   Pain Type Chronic pain   Pain Radiating Towards down L LE   Pain Onset More than a month ago   Pain Frequency Intermittent   Aggravating Factors  weight bearing   Pain Relieving Factors nothing   Effect of Pain on Daily Activities doesnt last very long but when it happens it can be very limiting.             [facility] - 04/24/16 0001      Assessment   Medical Diagnosis back fractuer of second lumbar vertebra    Referring Provider Kari BaarsEdward Hawkins   Onset Date/Surgical Date --  ~12/07/2015   Next MD Visit Dr. Juanetta GoslingHawkins in 6 weeks      Precautions   Precaution Comments no lifting anything heavy; otherwise stay within pain limits per patient but MD may update precautions next week                      [facility] Treatment/Exercise - 04/24/16 0001      Lumbar Exercises: Stretches   Quad Stretch 2 reps;30 seconds  in prone     Lumbar Exercises: Seated   Long Arc Quad on Chair Strengthening;2 sets  3# on the L and 5 # on the R with 5 second hold.      Lumbar Exercises: Supine   Bridge 15 reps  x 2 sets   Bridge Limitations 5 second hold with G TB on the 2nd set.    Other Supine Lumbar Exercises supine hip ABD with red TB 1x15 B - vc's to keep toes up, comp with L hip Flexors     Lumbar Exercises: Sidelying   Clam 10 reps;Other (comment)  x 2 with manual assist on the L for correct  technique.        Lumbar Exercises: Prone   Other Prone Lumbar Exercises HS curl 2 x 10 with 5# on right, and 3 on the L - but reduced to no weight                 PT Education - 04/24/16 0856    Education provided No  Education Details Pt states he needs to call the manufacturer of the brace due to bruising on lateral aspect of L knee   Person(s) Educated Patient   Methods Explanation;Demonstration;Tactile cues   Comprehension Verbalized understanding;Need further instruction;Tactile cues required          PT Short Term Goals - 04/24/16 0858      PT SHORT TERM GOAL #1   Title Patient to be able to ambulate at least 423ft with rollator and KAFO in order to demonstrate improvement in mobilty    Baseline 7/11- patient reports he is not doing them every day but has started working out again    Time 3   Period Weeks   Status On-going     PT SHORT TERM GOAL #2   Title Patient to be able to tolerate CKC exercise with KAFO unlocked and minimal unsteadiness/instability in order to demonstrate improvement in condition    Baseline 7/11- requires UE support to stand    Time 3   Period Weeks   Status Achieved     PT SHORT TERM GOAL #3   Title Patient to be able to statically stand with KAFO locked and no UE support in order to demonstrate improved balance    Time 3   Period Weeks   Status On-going     PT SHORT TERM GOAL #4   Title Patient will  consistetly perform appropriate HEP correctly, to be updated PRN    Baseline 7/11- patient states pain still getting around 5/10   Time 3   Period Weeks   Status Achieved           PT Long Term Goals - 04/24/16 1610      PT LONG TERM GOAL #1   Title Patient will demonstrate L LE strength at least 4-/5 and 5/5 muslce strength in all other tested groups in order to assist in improving gait and stablity    Time 6   Period Weeks   Status On-going     PT LONG TERM GOAL #2   Title Patient to be able to ambulate and perform functional mobility  with AFO and rollator rather than KAFO in order to demonstrate improved strength and mobiltiy    Time 6   Period Weeks   Status On-going     PT LONG TERM GOAL #3   Title Patient to be able to complete TUG balance test in 18 seconds or less wtih rollator in order to demonstrate improved mobility and reduced fall risk    Baseline 8/10: 26 seconds ( significant time to unlock and lock brace   Time 6   Period Weeks   Status On-going     PT LONG TERM GOAL #4   Title Patient to be able to ambulate at least 651ft with  rollator during in order to demonstrate improved overall mobilty    Time 6   Period Weeks   Status On-going               Plan - 04/24/16 1024    Clinical Impression Statement Continued functional strength training with focus on LE's - glutes, hamstrings, and hip abductors.  Worked on all of these on the mat table today for safety due to continued weakness.  Pt expressed greatest difficulty in quadruped and tall kneeling positions due to weakness in core and glutes/hip extensors.  Pt continues to demonstrate core weakness with trunk rotation during exercises that required manual assistance to correct.  Pt continues to  have rubbing on the lateral aspect of L knee from his KAFO, and states that he still needs to call the manufacturer regarding this.  Pt continues to demonstrate great gains today - tall kneeling for 1 min with intermittent UE support.     Rehab Potential Good   PT Frequency 2x / week   PT Duration Other (comment)  increased to a total of 16 weeks.    PT Treatment/Interventions ADLs/Self Care Home Management;Electrical Stimulation;Gait training;Stair training;Functional mobility training;Therapeutic activities;Therapeutic exercise;Balance training;Neuromuscular re-education;Patient/family education;Manual techniques;Energy conservation;Taping   PT Next Visit Plan Continue to progress tall kneeling tolerance to 1 min with 1 UE support, then adding rotation  in tall kneeling, and tall kneel walking.  Continue focus on functional activation of hip extensors, and Abductors   PT Home Exercise Plan No changes made 9/28   Consulted and Agree with Plan of Care Patient      Patient will benefit from skilled therapeutic intervention in order to improve the following deficits and impairments:  Abnormal gait, Decreased activity tolerance, Decreased strength, Decreased balance, Decreased mobility, Difficulty walking, Improper body mechanics, Decreased coordination, Impaired flexibility, Postural dysfunction  Visit Diagnosis: Difficulty in walking, not elsewhere classified  Muscle weakness (generalized)  Unsteadiness on feet  Bilateral leg weakness  Difficulty walking  Unsteadiness  Poor balance  Tight fascia  Decreased range of motion of right shoulder  Decreased range of motion of left shoulder  Cauda equina syndrome [facility])     Problem List Patient Active Problem List   Diagnosis Date Noted  . 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 Infection of urinary tract 10/26/2015  . Intractable back pain 10/22/2015  . Back pain 10/22/2015  . Muscle weakness (generalized) 12/05/2013  . Tight fascia 12/05/2013  . Decreased range of motion of right shoulder 12/05/2013  . Decreased range of motion of left shoulder 12/05/2013  . Bilateral leg weakness 11/21/2013  . Poor balance 11/21/2013  . Difficulty in walking(719.7) 11/21/2013  . Shoulder pain, left 09/12/2013  . Anemia, iron deficiency 09/09/2013  . Dyspnea 09/07/2013  . UTI (urinary tract infection) 09/07/2013  . Acute CHF (congestive heart failure) (HCC) 09/07/2013  . Elevated troponin 09/07/2013  . Fever and chills 09/06/2013  . Altered mental status 09/05/2013  . Neurogenic bowel 08/22/2013  . 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 Gout flare 08/22/2013  . Postoperative wound infection 08/22/2013  . Lumbar degenerative disc disease  08/02/2013  . Post-operative pain 07/29/2013  . Osteonecrosis (HCC) 05/05/2013  . Small bowel obstruction (HCC) 04/17/2013  . Acute renal failure (HCC) 04/17/2013  . Medial meniscus, posterior horn derangement 02/17/2013  . Knee sprain and strain 02/17/2013  . Trigger point with neck pain 03/18/2012  . Rotator cuff tear arthropathy of right shoulder 09/08/2011  . CAD in native artery 01/09/2011  . Ankle fracture 12/25/2010  . Contusion of shoulder, left 12/25/2010  . PEMPHIGUS VULGARIS 07/02/2010  . MRSA 05/13/2010  . PRURITUS 05/13/2010  . SPINAL STENOSIS, LUMBAR 05/13/2010  . HLD (hyperlipidemia) 04/24/2010  . GASTROESOPHAGEAL REFLUX DISEASE 04/24/2010  . VENTRAL HERNIA, INCISIONAL 04/24/2010  . DEGENERATIVE JOINT DISEASE 04/24/2010  . PYOGENIC ARTHRITIS, SHOULDER REGION 02/04/2010  . Essential hypertension 01/01/2010  . RUPTURE ROTATOR CUFF 02/19/2009   Beth Roczen Waymire, PT, DPT X: [phone]   [facility] [address] [place], [place], [address] Phone: [phone]   Fax:  [phone]  Name: [name] MRN: [id number] Date of Birth: [birth year]

## 2016-04-28 ENCOUNTER — Ambulatory Visit (HOSPITAL_COMMUNITY): Payer: Medicare Other | Attending: Pulmonary Disease | Admitting: Physical Therapy

## 2016-04-28 DIAGNOSIS — R29898 Other symptoms and signs involving the musculoskeletal system: Secondary | ICD-10-CM

## 2016-04-28 DIAGNOSIS — R262 Difficulty in walking, not elsewhere classified: Secondary | ICD-10-CM | POA: Diagnosis not present

## 2016-04-28 DIAGNOSIS — M6281 Muscle weakness (generalized): Secondary | ICD-10-CM | POA: Diagnosis not present

## 2016-04-28 DIAGNOSIS — R2681 Unsteadiness on feet: Secondary | ICD-10-CM | POA: Insufficient documentation

## 2016-04-28 NOTE — Therapy (Signed)
[facility] [address] [place], [place], [address] Phone: [phone]   Fax:  [phone]  Physical Therapy Treatment  Patient Details  Name: Jake MarvelRonald W Madani MRN: [id number] Date of Birth: 08/17/1940 Referring Provider: Kari BaarsEdward Hawkins  Encounter Date: 04/28/2016      PT End of Session - 04/28/16 0900    Visit Number 21   Number of Visits 32   Date for PT Re-Evaluation 05/07/16   Authorization Type Medicare/[employer]    Authorization Time Period 9/11/20017-06/06/2016   g code done on visit 16   Authorization - Visit Number 21   Authorization - Number of Visits 26   PT Start Time [phone]   PT Stop Time 0900   PT Time Calculation (min) 43 min   Equipment Utilized During Treatment Other (comment)  TB and ankle weights   Activity Tolerance Patient tolerated treatment well   Behavior During Therapy [facility] for tasks assessed/performed      Past Medical History:  Diagnosis Date  . Acute renal failure (HCC) 04/17/2013  . Anemia, iron deficiency 09/09/2013  . Anxiety   . Arthritis   . 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 Coronary artery disease    IN 2000   STENT PLACED IN 2012 sees Dr. Dietrich Patesothbart, saw last 2013  . Disorder of blood    BEEN TREATED BY DERMATOLOGIST X 4 YRS..."BLOOD BLISTERS"  . Gout   . HTN (hypertension)    [place]  . Hx MRSA infection    rt shoulder  . Hyperlipidemia   . Pneumonia    "I've had it 3-4 times"  . Small bowel obstruction   . Small bowel problem    HAD ALOT OF SCAR TISSUE FROM PREVIOUS SURGERIES..NG WAS INSERTED ...Marland Kitchen.Marland Kitchen.NO SURGERY NEEDED...Marland Kitchen.Marland Kitchen.IN FOR 8 DAYS  . Ventral hernia     Past Surgical History:  Procedure Laterality Date  . ANTERIOR CERVICAL CORPECTOMY  12/17/11  . ANTERIOR CERVICAL CORPECTOMY  12/17/2011   Procedure: ANTERIOR CERVICAL CORPECTOMY;  Surgeon: Karn CassisErnesto M Botero, MD;  Location: [facility];  Service: Neurosurgery;  Laterality: N/A;   Anterior Cervical Decompression Fusion Five to Thoracic Two with plating  . BACK SURGERY     lumbar  . CATARACT EXTRACTION W/ INTRAOCULAR LENS  IMPLANT, BILATERAL  ? 2011  . CHOLECYSTECTOMY  2006 "or after"  . CORONARY ANGIOPLASTY WITH STENT PLACEMENT  2012  . CORONARY ARTERY BYPASS GRAFT  2000   CABG X5  . EYE SURGERY     bilateral cataract  . INCISION AND DRAINAGE OF WOUND  ~ 20ll; 12/03/11   "had infection in my right"  . LUMBAR LAMINECTOMY/DECOMPRESSION MICRODISCECTOMY  06/13/2011   Procedure: LUMBAR LAMINECTOMY/DECOMPRESSION MICRODISCECTOMY;  Surgeon: Karn CassisErnesto M Botero;  Location: [facility];  Service: Neurosurgery;  Laterality: N/A;  Lumbar three, lumbar four-five Laminectomy  . LUMBAR LAMINECTOMY/DECOMPRESSION MICRODISCECTOMY Right 06/17/2013   Procedure: LUMBAR LAMINECTOMY/DECOMPRESSION MICRODISCECTOMY 1 LEVEL;  Surgeon: Karn CassisErnesto M Botero, MD;  Location: [facility];  Service: Neurosurgery;  Laterality: Right;  Right L3-4 Microdiskectomy  . LUMBAR WOUND DEBRIDEMENT N/A 08/02/2013   Procedure: INCISION AND DRAINAGE OF LUMBAR WOUND DEBRIDEMENT;  Surgeon: Karn CassisErnesto M Botero, MD;  Location: [facility];  Service: Neurosurgery;  Laterality: N/A;  . PERIPHERALLY INSERTED CENTRAL CATHETER INSERTION  2011 & 11/2011  . SHOULDER ARTHROSCOPY Left 09/13/2013   Procedure: ARTHROSCOPIC IRRIGATION AND DEBRIDEMENT, SYNOVECTOMY ,  LEFT SHOULDER ;  Surgeon: Margarito LinerW D  Carloyn Manner., MD;  Location: [facility];  Service: Orthopedics;  Laterality: Left;  . SHOULDER OPEN ROTATOR CUFF REPAIR  ~ 2011   right  . STERNAL SURG  2000   HAS HAD 5-6 ON HIS STERNUM; "caught MRSA in it"  . TONSILLECTOMY  1949    There were no vitals filed for this visit.      Subjective Assessment - 04/28/16 0822    Subjective Pt states he feels that he is getting stronger    Currently in Pain? Yes   Pain Score 2    Pain Location Back   Pain Orientation Lower   Pain Descriptors / Indicators Aching;Throbbing   Pain Onset More than a month  ago   Pain Frequency Constant   Aggravating Factors  weight bearing    Pain Relieving Factors medication    Effect of Pain on Daily Activities increases                         [facility] Adult PT Treatment/Exercise - 04/28/16 0001      Lumbar Exercises: Stretches   Prone on Elbows Stretch 2 reps;30 seconds   Prone Mid Back Stretch 2 reps;30 seconds   Quad Stretch 2 reps;30 seconds     Lumbar Exercises: Machines for Strengthening   Leg Press new step x 10:00 hills 3 level 3; done after session and not counted towards time      Lumbar Exercises: Standing   Other Standing Lumbar Exercises unsupported standing with min guard, some posterior lean noted   Other Standing Lumbar Exercises step forward with left then back ; forward with right then back step out to right  back then left back      Lumbar Exercises: Seated   LAQ on Chair Weights (lbs) 5# on Rt with AA for terminal extension and pt hold x 10    Sit to Stand 10 reps     Lumbar Exercises: Sidelying   Hip Abduction 10 reps   Hip Abduction Weights (lbs) AA LT x 2 rep      Lumbar Exercises: Prone   Straight Leg Raise 10 reps   Straight Leg Raises Limitations Lt with knee bent to 90 degrees;    Other Prone Lumbar Exercises HS curl AA Lt ; AROM Rt x sq0      Lumbar Exercises: Quadruped   Opposite Arm/Leg Raise Limitations axial extension x 10    Other Quadruped Lumbar Exercises crawling x 2    Other Quadruped Lumbar Exercises tall kneeling balance activity                   PT Short Term Goals - 04/24/16 0858      PT SHORT TERM GOAL #1   Title Patient to be able to ambulate at least 484ft with rollator and KAFO in order to demonstrate improvement in mobilty    Baseline 7/11- patient reports he is not doing them every day but has started working out again    Time 3   Period Weeks   Status On-going     PT SHORT TERM GOAL #2   Title Patient to be able to tolerate CKC exercise with KAFO unlocked and  minimal unsteadiness/instability in order to demonstrate improvement in condition    Baseline 7/11- requires UE support to stand    Time 3   Period Weeks   Status Achieved     PT SHORT TERM GOAL #3   Title Patient to  be able to statically stand with KAFO locked and no UE support in order to demonstrate improved balance    Time 3   Period Weeks   Status On-going     PT SHORT TERM GOAL #4   Title Patient will  consistetly perform appropriate HEP correctly, to be updated PRN    Baseline 7/11- patient states pain still getting around 5/10   Time 3   Period Weeks   Status Achieved           PT Long Term Goals - 04/24/16 1610      PT LONG TERM GOAL #1   Title Patient will demonstrate L LE strength at least 4-/5 and 5/5 muslce strength in all other tested groups in order to assist in improving gait and stablity    Time 6   Period Weeks   Status On-going     PT LONG TERM GOAL #2   Title Patient to be able to ambulate and perform functional mobility with AFO and rollator rather than KAFO in order to demonstrate improved strength and mobiltiy    Time 6   Period Weeks   Status On-going     PT LONG TERM GOAL #3   Title Patient to be able to complete TUG balance test in 18 seconds or less wtih rollator in order to demonstrate improved mobility and reduced fall risk    Baseline 8/10: 26 seconds ( significant time to unlock and lock brace   Time 6   Period Weeks   Status On-going     PT LONG TERM GOAL #4   Title Patient to be able to ambulate at least 635ft with  rollator during in order to demonstrate improved overall mobilty    Time 6   Period Weeks   Status On-going               Plan - 04/28/16 0913    Clinical Impression Statement Pt continues to slowly increase in strength. Worked on weight shifting without UE assist.   This task is very difficult for pt and is only able to complete one or two reps without UE assist.  PT will continue to benefit from skilled  therapy to promote weightbearing and balance actitivites.    Rehab Potential Good   PT Frequency 2x / week   PT Duration Other (comment)  increased to a total of 16 weeks.    PT Treatment/Interventions ADLs/Self Care Home Management;Electrical Stimulation;Gait training;Stair training;Functional mobility training;Therapeutic activities;Therapeutic exercise;Balance training;Neuromuscular re-education;Patient/family education;Manual techniques;Energy conservation;Taping   PT Next Visit Plan Continue to progress tall kneeling tolerance to 1 min with 1 UE support, then adding rotation in tall kneeling, and tall kneel walking.  Continue focus on functional activation of hip extensors, and Abductors   PT Home Exercise Plan No changes made 9/28   Consulted and Agree with Plan of Care Patient      Patient will benefit from skilled therapeutic intervention in order to improve the following deficits and impairments:  Abnormal gait, Decreased activity tolerance, Decreased strength, Decreased balance, Decreased mobility, Difficulty walking, Improper body mechanics, Decreased coordination, Impaired flexibility, Postural dysfunction  Visit Diagnosis: Difficulty in walking, not elsewhere classified  Muscle weakness (generalized)  Unsteadiness on feet  Bilateral leg weakness     Problem List Patient Active Problem List   Diagnosis Date Noted  . 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 Infection of urinary tract 10/26/2015  . Intractable back pain 10/22/2015  . Back pain  10/22/2015  . Muscle weakness (generalized) 12/05/2013  . Tight fascia 12/05/2013  . Decreased range of motion of right shoulder 12/05/2013  . Decreased range of motion of left shoulder 12/05/2013  . Bilateral leg weakness 11/21/2013  . Poor balance 11/21/2013  . Difficulty in walking(719.7) 11/21/2013  . Shoulder pain, left 09/12/2013  . Anemia, iron deficiency 09/09/2013  . Dyspnea 09/07/2013  . UTI (urinary  tract infection) 09/07/2013  . Acute CHF (congestive heart failure) (HCC) 09/07/2013  . Elevated troponin 09/07/2013  . Fever and chills 09/06/2013  . Altered mental status 09/05/2013  . Neurogenic bowel 08/22/2013  . 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 Gout flare 08/22/2013  . Postoperative wound infection 08/22/2013  . Lumbar degenerative disc disease 08/02/2013  . Post-operative pain 07/29/2013  . Osteonecrosis (HCC) 05/05/2013  . Small bowel obstruction 04/17/2013  . Acute renal failure (HCC) 04/17/2013  . Medial meniscus, posterior horn derangement 02/17/2013  . Knee sprain and strain 02/17/2013  . Trigger point with neck pain 03/18/2012  . Rotator cuff tear arthropathy of right shoulder 09/08/2011  . CAD in native artery 01/09/2011  . Ankle fracture 12/25/2010  . Contusion of shoulder, left 12/25/2010  . PEMPHIGUS VULGARIS 07/02/2010  . MRSA 05/13/2010  . PRURITUS 05/13/2010  . SPINAL STENOSIS, LUMBAR 05/13/2010  . HLD (hyperlipidemia) 04/24/2010  . GASTROESOPHAGEAL REFLUX DISEASE 04/24/2010  . VENTRAL HERNIA, INCISIONAL 04/24/2010  . DEGENERATIVE JOINT DISEASE 04/24/2010  . PYOGENIC ARTHRITIS, SHOULDER REGION 02/04/2010  . Essential hypertension 01/01/2010  . RUPTURE ROTATOR CUFF 02/19/2009    Virgina Organ, PT CLT [phone] 04/28/2016, 9:17 AM  [facility] [address] [place], [place], [address] Phone: [phone]   Fax:  [phone]  Name: KAMARIE PALMA MRN: [id number] Date of Birth: [birth year]

## 2016-04-30 DIAGNOSIS — I10 Essential (primary) hypertension: Secondary | ICD-10-CM | POA: Diagnosis not present

## 2016-04-30 DIAGNOSIS — R040 Epistaxis: Secondary | ICD-10-CM | POA: Diagnosis not present

## 2016-04-30 DIAGNOSIS — Z23 Encounter for immunization: Secondary | ICD-10-CM | POA: Diagnosis not present

## 2016-05-01 ENCOUNTER — Ambulatory Visit (HOSPITAL_COMMUNITY): Payer: Medicare Other

## 2016-05-01 DIAGNOSIS — M6281 Muscle weakness (generalized): Secondary | ICD-10-CM

## 2016-05-01 DIAGNOSIS — R2681 Unsteadiness on feet: Secondary | ICD-10-CM | POA: Diagnosis not present

## 2016-05-01 DIAGNOSIS — R29898 Other symptoms and signs involving the musculoskeletal system: Secondary | ICD-10-CM | POA: Diagnosis not present

## 2016-05-01 DIAGNOSIS — R262 Difficulty in walking, not elsewhere classified: Secondary | ICD-10-CM | POA: Diagnosis not present

## 2016-05-01 NOTE — Therapy (Signed)
[facility] [address] [place], [place], [address] Phone: [phone]   Fax:  [phone]  Physical Therapy Treatment  Patient Details  Name: [name] MRN: [id number] Date of Birth: 08/28/1940 Referring Provider: Kari BaarsEdward Hawkins  Encounter Date: 05/01/2016      PT End of Session - 05/01/16 0828    Visit Number 22   Number of Visits 32   Date for PT Re-Evaluation 05/07/16   Authorization Type Medicare/[employer]    Authorization Time Period 9/11/20017-06/06/2016   g code done on visit 16   Authorization - Visit Number 22   Authorization - Number of Visits 26   PT Start Time 0814   PT Stop Time 0910   PT Time Calculation (min) 56 min   Equipment Utilized During Treatment Gait belt  knee brace   Activity Tolerance Patient tolerated treatment well   Behavior [facility] Therapy [facility] for tasks assessed/performed      Past Medical History:  Diagnosis Date  . Acute renal failure (HCC) 04/17/2013  . Anemia, iron deficiency 09/09/2013  . Anxiety   . Arthritis   . 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 Coronary artery disease    IN 2000   STENT PLACED IN 2012 sees Dr. Dietrich Patesothbart, saw last 2013  . Disorder of blood    BEEN TREATED BY DERMATOLOGIST X 4 YRS..."BLOOD BLISTERS"  . Gout   . HTN (hypertension)    [place]. [place]  . Hx MRSA infection    rt shoulder  . Hyperlipidemia   . Pneumonia    "I've had it 3-4 times"  . Small bowel obstruction   . Small bowel problem    HAD ALOT OF SCAR TISSUE FROM PREVIOUS SURGERIES..NG WAS INSERTED ...Marland Kitchen.Marland Kitchen.NO SURGERY NEEDED...Marland Kitchen.Marland Kitchen.IN FOR 8 DAYS  . Ventral hernia     Past Surgical History:  Procedure Laterality Date  . ANTERIOR CERVICAL CORPECTOMY  12/17/11  . ANTERIOR CERVICAL CORPECTOMY  12/17/2011   Procedure: ANTERIOR CERVICAL CORPECTOMY;  Surgeon: Karn CassisErnesto M Botero, MD;  Location: [facility];  Service: Neurosurgery;  Laterality: N/A;  Anterior Cervical  Decompression Fusion Five to Thoracic Two with plating  . BACK SURGERY     lumbar  . CATARACT EXTRACTION W/ INTRAOCULAR LENS  IMPLANT, BILATERAL  ? 2011  . CHOLECYSTECTOMY  2006 "or after"  . CORONARY ANGIOPLASTY WITH STENT PLACEMENT  2012  . CORONARY ARTERY BYPASS GRAFT  2000   CABG X5  . EYE SURGERY     bilateral cataract  . INCISION AND DRAINAGE OF WOUND  ~ 20ll; 12/03/11   "had infection in my right"  . LUMBAR LAMINECTOMY/DECOMPRESSION MICRODISCECTOMY  06/13/2011   Procedure: LUMBAR LAMINECTOMY/DECOMPRESSION MICRODISCECTOMY;  Surgeon: Karn CassisErnesto M Botero;  Location: [facility];  Service: Neurosurgery;  Laterality: N/A;  Lumbar three, lumbar four-five Laminectomy  . LUMBAR LAMINECTOMY/DECOMPRESSION MICRODISCECTOMY Right 06/17/2013   Procedure: LUMBAR LAMINECTOMY/DECOMPRESSION MICRODISCECTOMY 1 LEVEL;  Surgeon: Karn CassisErnesto M Botero, MD;  [facility]: [facility];  Service: Neurosurgery;  Laterality: Right;  Right L3-4 Microdiskectomy  . LUMBAR WOUND DEBRIDEMENT N/A 08/02/2013   Procedure: INCISION AND DRAINAGE OF LUMBAR WOUND DEBRIDEMENT;  Surgeon: Karn CassisErnesto M Botero, MD;  Location: [facility];  Service: Neurosurgery;  Laterality: N/A;  . PERIPHERALLY INSERTED CENTRAL CATHETER INSERTION  2011 & 11/2011  . SHOULDER ARTHROSCOPY Left 09/13/2013   Procedure: ARTHROSCOPIC IRRIGATION AND DEBRIDEMENT, SYNOVECTOMY ,  LEFT SHOULDER ;  Surgeon: Thera FlakeW D Caffrey Jr.,  MD;  Location: [facility];  Service: Orthopedics;  Laterality: Left;  . SHOULDER OPEN ROTATOR CUFF REPAIR  ~ 2011   right  . STERNAL SURG  2000   HAS HAD 5-6 ON HIS STERNUM; "caught MRSA in it"  . TONSILLECTOMY  1949    There were no vitals filed for this visit.      Subjective Assessment - 05/01/16 0811    Subjective Pt stated his back is bothering him today, pain scale 5/10.   Pertinent History beta blockers, CHF/CAD, HTN, history of lumbar laminectomy, CABG, history of ventral hernia, history of AVN B hips, history of cervical corpectomy, brace  secondary to no sternum   Patient Stated Goals get leg working better, get walking    Currently in Pain? Yes   Pain Score 5    Pain Location Back   Pain Orientation Lower   Pain Descriptors / Indicators Throbbing   Pain Type Chronic pain   Pain Radiating Towards no radicular symptoms   Pain Onset More than a month ago   Pain Frequency Constant   Aggravating Factors  weight bearing   Pain Relieving Factors medication   Effect of Pain on Daily Activities increases                         [facility] Adult PT Treatment/Exercise - 05/01/16 0001      Lumbar Exercises: Stretches   Prone on Elbows Stretch 2 reps;30 seconds   Prone Mid Back Stretch 2 reps;30 seconds   Quad Stretch 2 reps;30 seconds     Lumbar Exercises: Aerobic   Stationary Bike Nustep x 10:00; hill L3 at end of session unsupervised     Lumbar Exercises: Standing   Other Standing Lumbar Exercises unsupported standing with min guard, some posterior lean noted   Other Standing Lumbar Exercises Hip ABD 2x 10; sidestep 2RT     Lumbar Exercises: Seated   LAQ on Chair Weights (lbs) 5# on Rt with AA for terminal extension and pt hold x 10    Sit to Stand 10 reps     Lumbar Exercises: Prone   Straight Leg Raise 10 reps   Straight Leg Raises Limitations Lt with knee bent to 90 degrees;    Other Prone Lumbar Exercises HS curl AA Lt ; AROM Rt x sq0      Lumbar Exercises: Quadruped   Opposite Arm/Leg Raise Limitations axial extension x 10    Other Quadruped Lumbar Exercises crawling x 2    Other Quadruped Lumbar Exercises tall kneeling balance activity                   PT Short Term Goals - 04/24/16 0858      PT SHORT TERM GOAL #1   Title Patient to be able to ambulate at least 465ft with rollator and KAFO in order to demonstrate improvement in mobilty    Baseline 7/11- patient reports he is not doing them every day but has started working out again    Time 3   Period Weeks   Status On-going      PT SHORT TERM GOAL #2   Title Patient to be able to tolerate CKC exercise with KAFO unlocked and minimal unsteadiness/instability in order to demonstrate improvement in condition    Baseline 7/11- requires UE support to stand    Time 3   Period Weeks   Status Achieved     PT SHORT TERM GOAL #3  Title Patient to be able to statically stand with KAFO locked and no UE support in order to demonstrate improved balance    Time 3   Period Weeks   Status On-going     PT SHORT TERM GOAL #4   Title Patient will  consistetly perform appropriate HEP correctly, to be updated PRN    Baseline 7/11- patient states pain still getting around 5/10   Time 3   Period Weeks   Status Achieved           PT Long Term Goals - 04/24/16 1610      PT LONG TERM GOAL #1   Title Patient will demonstrate L LE strength at least 4-/5 and 5/5 muslce strength in all other tested groups in order to assist in improving gait and stablity    Time 6   Period Weeks   Status On-going     PT LONG TERM GOAL #2   Title Patient to be able to ambulate and perform functional mobility with AFO and rollator rather than KAFO in order to demonstrate improved strength and mobiltiy    Time 6   Period Weeks   Status On-going     PT LONG TERM GOAL #3   Title Patient to be able to complete TUG balance test in 18 seconds or less wtih rollator in order to demonstrate improved mobility and reduced fall risk    Baseline 8/10: 26 seconds ( significant time to unlock and lock brace   Time 6   Period Weeks   Status On-going     PT LONG TERM GOAL #4   Title Patient to be able to ambulate at least 665ft with  rollator during in order to demonstrate improved overall mobilty    Time 6   Period Weeks   Status On-going               Plan - 05/01/16 0911    Clinical Impression Statement Continued session foucs on functional strengthening.  Worked on weight shifting with UE assistance, pt continues to present with  extreme difficulty requiring assistance for safety.  Pt improving tolerance with tall kneeling activities with abiltiy to reduce UE support though unable to tolerate for 1 minute this session.  No reports of increased pain through session, did require frequent cueing for posture.   Rehab Potential Good   PT Frequency 2x / week   PT Duration --  16 weeks   PT Treatment/Interventions ADLs/Self Care Home Management;Electrical Stimulation;Gait training;Stair training;Functional mobility training;Therapeutic activities;Therapeutic exercise;Balance training;Neuromuscular re-education;Patient/family education;Manual techniques;Energy conservation;Taping   PT Next Visit Plan Continue to progress tall kneeling tolerance to 1 min with 1 UE support, then adding rotation in tall kneeling, and tall kneel walking.  Continue focus on functional activation of hip extensors, and Abductors      Patient will benefit from skilled therapeutic intervention in order to improve the following deficits and impairments:  Abnormal gait, Decreased activity tolerance, Decreased strength, Decreased balance, Decreased mobility, Difficulty walking, Improper body mechanics, Decreased coordination, Impaired flexibility, Postural dysfunction  Visit Diagnosis: Difficulty in walking, not elsewhere classified  Muscle weakness (generalized)  Unsteadiness on feet     Problem List Patient Active Problem List   Diagnosis Date Noted  . 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 Infection of urinary tract 10/26/2015  . Intractable back pain 10/22/2015  . Back pain 10/22/2015  . Muscle weakness (generalized) 12/05/2013  . Tight fascia 12/05/2013  . Decreased range of motion  of right shoulder 12/05/2013  . Decreased range of motion of left shoulder 12/05/2013  . Bilateral leg weakness 11/21/2013  . Poor balance 11/21/2013  . Difficulty in walking(719.7) 11/21/2013  . Shoulder pain, left 09/12/2013  . Anemia, iron  deficiency 09/09/2013  . Dyspnea 09/07/2013  . UTI (urinary tract infection) 09/07/2013  . Acute CHF (congestive heart failure) (HCC) 09/07/2013  . Elevated troponin 09/07/2013  . Fever and chills 09/06/2013  . Altered mental status 09/05/2013  . Neurogenic bowel 08/22/2013  . 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 Gout flare 08/22/2013  . Postoperative wound infection 08/22/2013  . Lumbar degenerative disc disease 08/02/2013  . Post-operative pain 07/29/2013  . Osteonecrosis (HCC) 05/05/2013  . Small bowel obstruction 04/17/2013  . Acute renal failure (HCC) 04/17/2013  . Medial meniscus, posterior horn derangement 02/17/2013  . Knee sprain and strain 02/17/2013  . Trigger point with neck pain 03/18/2012  . Rotator cuff tear arthropathy of right shoulder 09/08/2011  . CAD in native artery 01/09/2011  . Ankle fracture 12/25/2010  . Contusion of shoulder, left 12/25/2010  . PEMPHIGUS VULGARIS 07/02/2010  . MRSA 05/13/2010  . PRURITUS 05/13/2010  . SPINAL STENOSIS, LUMBAR 05/13/2010  . HLD (hyperlipidemia) 04/24/2010  . GASTROESOPHAGEAL REFLUX DISEASE 04/24/2010  . VENTRAL HERNIA, INCISIONAL 04/24/2010  . DEGENERATIVE JOINT DISEASE 04/24/2010  . PYOGENIC ARTHRITIS, SHOULDER REGION 02/04/2010  . Essential hypertension 01/01/2010  . RUPTURE ROTATOR CUFF 02/19/2009   Becky Sax, LPTA; CBIS [phone]  Juel Burrow 05/01/2016, 9:29 AM  [facility] [address] [place], [place], [address] Phone: [phone]   Fax:  [phone]  Name: [name] MRN: [id number] Date of Birth: [birth year]

## 2016-05-05 ENCOUNTER — Ambulatory Visit (HOSPITAL_COMMUNITY): Payer: Medicare Other | Admitting: Physical Therapy

## 2016-05-05 ENCOUNTER — Telehealth (HOSPITAL_COMMUNITY): Payer: Self-pay

## 2016-05-05 NOTE — Telephone Encounter (Signed)
10/9 he left a message that he was sick today and couldn't come in

## 2016-05-08 ENCOUNTER — Telehealth (HOSPITAL_COMMUNITY): Payer: Self-pay | Admitting: Physical Therapy

## 2016-05-08 ENCOUNTER — Ambulatory Visit (HOSPITAL_COMMUNITY): Payer: Medicare Other | Admitting: Physical Therapy

## 2016-05-08 NOTE — Telephone Encounter (Signed)
Pt did not show for appointment.  Called and left message that his next appointment will be on Monday at 8:15.  Virgina Organynthia Iley Breeden, PT CLT [phone]

## 2016-05-12 ENCOUNTER — Telehealth (HOSPITAL_COMMUNITY): Payer: Self-pay

## 2016-05-12 ENCOUNTER — Ambulatory Visit (HOSPITAL_COMMUNITY): Payer: Medicare Other | Admitting: Physical Therapy

## 2016-05-12 NOTE — Telephone Encounter (Signed)
10/16 he left a messge that he was still sick

## 2016-05-13 DIAGNOSIS — R197 Diarrhea, unspecified: Secondary | ICD-10-CM | POA: Diagnosis not present

## 2016-05-13 DIAGNOSIS — I10 Essential (primary) hypertension: Secondary | ICD-10-CM | POA: Diagnosis not present

## 2016-05-14 ENCOUNTER — Telehealth (HOSPITAL_COMMUNITY): Payer: Self-pay

## 2016-05-14 NOTE — Telephone Encounter (Signed)
10/18 Pt called and said he couldn't come tomorrow... He is still sick.  I asked him what was wrong and he said that he has had diarrhea for the last 2 weeks and has barely eaten anything.

## 2016-05-15 ENCOUNTER — Ambulatory Visit (HOSPITAL_COMMUNITY): Payer: Medicare Other | Admitting: Physical Therapy

## 2016-05-19 ENCOUNTER — Ambulatory Visit (HOSPITAL_COMMUNITY): Payer: Medicare Other | Admitting: Physical Therapy

## 2016-05-19 DIAGNOSIS — R262 Difficulty in walking, not elsewhere classified: Secondary | ICD-10-CM | POA: Diagnosis not present

## 2016-05-19 DIAGNOSIS — M6281 Muscle weakness (generalized): Secondary | ICD-10-CM

## 2016-05-19 DIAGNOSIS — R29898 Other symptoms and signs involving the musculoskeletal system: Secondary | ICD-10-CM | POA: Diagnosis not present

## 2016-05-19 DIAGNOSIS — R2681 Unsteadiness on feet: Secondary | ICD-10-CM | POA: Diagnosis not present

## 2016-05-19 NOTE — Therapy (Signed)
[facility] [address] [place], [place], [address] Phone: [phone]   Fax:  [phone]  Physical Therapy Treatment  Patient Details  Name: [name] MRN: [id number] Date of Birth: 07-12-1941 Referring Provider: Kari [name]   Encounter Date: 05/19/2016      PT End of Session - 05/19/16 0858    Visit Number 21   Number of Visits 32   Date for PT Re-Evaluation 06/18/16   Authorization Type Medicare/[employer]    Authorization Time Period 9/11/20017-06/06/2016   g code done on visit 16   Authorization - Visit Number 21   Authorization - Number of Visits 26   PT Start Time [phone]   PT Stop Time 0858   PT Time Calculation (min) 42 min   Equipment Utilized During Treatment Gait belt  knee brace   Activity Tolerance Patient tolerated treatment well   Behavior During Therapy WFL for tasks assessed/performed      Past Medical History:  Diagnosis Date  . Acute renal failure (HCC) 04/17/2013  . Anemia, iron deficiency 09/09/2013  . Anxiety   . Arthritis   . 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 Coronary artery disease    IN 2000   STENT PLACED IN 2012 sees Dr. Dietrich [name], saw last 2013  . Disorder of blood    BEEN TREATED BY DERMATOLOGIST X 4 YRS..."BLOOD BLISTERS"  . Gout   . HTN (hypertension)    sees Dr. Juanetta [name] in [place]  . Hx MRSA infection    rt shoulder  . Hyperlipidemia   . Pneumonia    "I've had it 3-4 times"  . Small bowel obstruction   . Small bowel problem    HAD ALOT OF SCAR TISSUE FROM PREVIOUS SURGERIES..NG WAS INSERTED ...Marland KitchenMarland KitchenNO SURGERY NEEDED...Marland KitchenMarland KitchenIN FOR 8 DAYS  . Ventral hernia     Past Surgical History:  Procedure Laterality Date  . ANTERIOR CERVICAL CORPECTOMY  12/17/11  . ANTERIOR CERVICAL CORPECTOMY  12/17/2011   Procedure: ANTERIOR CERVICAL CORPECTOMY;  Surgeon: Jake Cassis, MD;  Location: [facility];  Service: Neurosurgery;  Laterality: N/A;  Anterior  Cervical Decompression Fusion Five to Thoracic Two with plating  . BACK SURGERY     lumbar  . CATARACT EXTRACTION W/ INTRAOCULAR LENS  IMPLANT, BILATERAL  ? 2011  . CHOLECYSTECTOMY  2006 "or after"  . CORONARY ANGIOPLASTY WITH STENT PLACEMENT  2012  . CORONARY ARTERY BYPASS GRAFT  2000   CABG X5  . EYE SURGERY     bilateral cataract  . INCISION AND DRAINAGE OF WOUND  ~ 20ll; 12/03/11   "had infection in my right"  . LUMBAR LAMINECTOMY/DECOMPRESSION MICRODISCECTOMY  06/13/2011   Procedure: LUMBAR LAMINECTOMY/DECOMPRESSION MICRODISCECTOMY;  Surgeon: [name];  Location: [facility];  Service: Neurosurgery;  Laterality: N/A;  Lumbar three, lumbar four-five Laminectomy  . LUMBAR LAMINECTOMY/DECOMPRESSION MICRODISCECTOMY Right 06/17/2013   Procedure: LUMBAR LAMINECTOMY/DECOMPRESSION MICRODISCECTOMY 1 LEVEL;  Surgeon: Jake Cassis, MD;  Location: [facility];  Service: Neurosurgery;  Laterality: Right;  Right L3-4 Microdiskectomy  . LUMBAR WOUND DEBRIDEMENT N/A 08/02/2013   Procedure: INCISION AND DRAINAGE OF LUMBAR WOUND DEBRIDEMENT;  Surgeon: Jake Cassis, MD;  Location: [facility];  Service: Neurosurgery;  Laterality: N/A;  . PERIPHERALLY INSERTED CENTRAL CATHETER INSERTION  2011 & 11/2011  . SHOULDER ARTHROSCOPY Left 09/13/2013   Procedure: ARTHROSCOPIC IRRIGATION AND DEBRIDEMENT, SYNOVECTOMY ,  LEFT SHOULDER ;  Surgeon: Jake Brackett  Montez HagemanJr., MD;  Location: [facility];  Service: Orthopedics;  Laterality: Left;  . SHOULDER OPEN ROTATOR CUFF REPAIR  ~ 2011   right  . STERNAL SURG  2000   HAS HAD 5-6 ON HIS STERNUM; "caught MRSA in it"  . TONSILLECTOMY  1949    There were no vitals filed for this visit.      Subjective Assessment - 05/19/16 0817    Subjective Pt has not been to therapy since the 5th due to illness.     Pertinent History beta blockers, CHF/CAD, HTN, history of lumbar laminectomy, CABG, history of ventral hernia, history of AVN B hips, history of cervical corpectomy,  brace secondary to no sternum   Limitations Standing;Lifting;House hold activities   How long can you sit comfortably? no problem    How long can you stand comfortably? with walker he is able to stand quite a while but has not measured    How long can you walk comfortably? with walker he is able to walk for 10-15 minutes    Patient Stated Goals get leg working better, get walking    Currently in Pain? Yes   Pain Score 5    Pain Location Back   Pain Orientation Lower   Pain Type Chronic pain   Pain Onset More than a month ago   Aggravating Factors  weight bearing    Multiple Pain Sites No            [facility] PT Assessment - 05/19/16 0001      Assessment   Medical Diagnosis back fractuer of second lumbar vertebra    Referring Provider [name]    Onset Date/Surgical Date --  approximately 12/07/2015   Next MD Visit Dr. Juanetta [name] in 6 weeks      Precautions   Precaution Comments no lifting anything heavy; otherwise stay within pain limits per patient but MD may update precautions next week      Balance Screen   Has the patient fallen in the past 6 months No   Has the patient had a decrease in activity level because of a fear of falling?  No   Is the patient reluctant to leave their home because of a fear of falling?  No     Prior Function   Level of Independence Independent;Independent with basic ADLs;Independent with gait;Independent with transfers   Vocation Retired   Leisure none, just get back to walking      Strength   Right Hip Flexion 5/5  was 5/5   Right Hip Extension 2+/5  was 3/5    Right Hip ABduction 3-/5  was 4-/5 : pt has been sick for 2 weeks .    Left Hip Flexion 4/5  was 4/5 last re-evaluation    Left Hip Extension 2/5  3-/5    Left Hip ABduction 3-/5  was 3-/5    Right Knee Flexion 3-/5  was a 3+/5 last re-evaluation    Right Knee Extension 5/5  was 5/5   Left Knee Flexion 2/5  was 2+/5 at last reassessment    Left Knee Extension 4/5  was  4-/5    Right Ankle Dorsiflexion 4-/5  was 3+/5   Left Ankle Dorsiflexion 1/5  was 1/5      Ambulation/Gait   Gait Comments use of KAFO throughout gait as patient''s L LE buckles without; reduced heel-toe pattern and gait speed, flexed at hips, excessive use of UEs on walker      6 minute walk  test results    Aerobic Endurance Distance Walked 226   Endurance additional comments 2:10MWT, rollator, KAFO   was 2:30 and had to stop; pt able to continue 2:44for 264'     High Level Balance   High Level Balance Comments TUG 24.78 with KAFO and rollator   was 36.6 seconds                      [facility] Adult PT Treatment/Exercise - 05/19/16 0001      Ambulation/Gait   Ambulation/Gait Yes   Ambulation/Gait Assistance 6: Modified independent (Device/Increase time)   Ambulation Distance (Feet) 225 Feet   Assistive device Rolling walker     Lumbar Exercises: Supine   Other Supine Lumbar Exercises ankle dorsi/plantar flexion x 10      Lumbar Exercises: Sidelying   Hip Abduction 10 reps   Hip Abduction Weights (lbs) AA on both    Other Sidelying Lumbar Exercises Lt knee flexion and hip extension x 10      Lumbar Exercises: Prone   Straight Leg Raise 10 reps   Straight Leg Raises Limitations Lt with knee bent to 90 degrees;    Other Prone Lumbar Exercises AA ham curl B x 10                   PT Short Term Goals - 04/24/16 0858      PT SHORT TERM GOAL #1   Title Patient to be able to ambulate at least 475ft with rollator and KAFO in order to demonstrate improvement in mobilty    Baseline 7/11- patient reports he is not doing them every day but has started working out again    Time 3   Period Weeks   Status On-going     PT SHORT TERM GOAL #2   Title Patient to be able to tolerate CKC exercise with KAFO unlocked and minimal unsteadiness/instability in order to demonstrate improvement in condition    Baseline 7/11- requires UE support to stand    Time 3   Period  Weeks   Status Achieved     PT SHORT TERM GOAL #3   Title Patient to be able to statically stand with KAFO locked and no UE support in order to demonstrate improved balance    Time 3   Period Weeks   Status On-going     PT SHORT TERM GOAL #4   Title Patient will  consistetly perform appropriate HEP correctly, to be updated PRN    Baseline 7/11- patient states pain still getting around 5/10   Time 3   Period Weeks   Status Achieved           PT Long Term Goals - 04/24/16 1610      PT LONG TERM GOAL #1   Title Patient will demonstrate L LE strength at least 4-/5 and 5/5 muslce strength in all other tested groups in order to assist in improving gait and stablity    Time 6   Period Weeks   Status On-going     PT LONG TERM GOAL #2   Title Patient to be able to ambulate and perform functional mobility with AFO and rollator rather than KAFO in order to demonstrate improved strength and mobiltiy    Time 6   Period Weeks   Status On-going     PT LONG TERM GOAL #3   Title Patient to be able to complete TUG balance test in 18 seconds or less  wtih rollator in order to demonstrate improved mobility and reduced fall risk    Baseline 8/10: 26 seconds ( significant time to unlock and lock brace   Time 6   Period Weeks   Status On-going     PT LONG TERM GOAL #4   Title Patient to be able to ambulate at least 620ft with  rollator during in order to demonstrate improved overall mobilty    Time 6   Period Weeks   Status On-going               Plan - 05/19/16 0859    Clinical Impression Statement Pt has not been to treatment since 05/01/2016 due to being ill.  Pt states he has not been able to eat in two weeks and had his first meal on Saturday.  Pt reassessed with anticipated decreased strength and endurance noted    Rehab Potential Good   PT Frequency 2x / week   PT Duration --  16 weeks   PT Treatment/Interventions ADLs/Self Care Home Management;Electrical  Stimulation;Gait training;Stair training;Functional mobility training;Therapeutic activities;Therapeutic exercise;Balance training;Neuromuscular re-education;Patient/family education;Manual techniques;Energy conservation;Taping   PT Next Visit Plan begin quadriped activtiy next treatment       Patient will benefit from skilled therapeutic intervention in order to improve the following deficits and impairments:  Abnormal gait, Decreased activity tolerance, Decreased strength, Decreased balance, Decreased mobility, Difficulty walking, Improper body mechanics, Decreased coordination, Impaired flexibility, Postural dysfunction  Visit Diagnosis: Difficulty in walking, not elsewhere classified  Muscle weakness (generalized)  Unsteadiness on feet     Problem List Patient Active Problem List   Diagnosis Date Noted  . 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 Infection of urinary tract 10/26/2015  . Intractable back pain 10/22/2015  . Back pain 10/22/2015  . Muscle weakness (generalized) 12/05/2013  . Tight fascia 12/05/2013  . Decreased range of motion of right shoulder 12/05/2013  . Decreased range of motion of left shoulder 12/05/2013  . Bilateral leg weakness 11/21/2013  . Poor balance 11/21/2013  . Difficulty in walking(719.7) 11/21/2013  . Shoulder pain, left 09/12/2013  . Anemia, iron deficiency 09/09/2013  . Dyspnea 09/07/2013  . UTI (urinary tract infection) 09/07/2013  . Acute CHF (congestive heart failure) (HCC) 09/07/2013  . Elevated troponin 09/07/2013  . Fever and chills 09/06/2013  . Altered mental status 09/05/2013  . Neurogenic bowel 08/22/2013  . 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 Gout flare 08/22/2013  . Postoperative wound infection 08/22/2013  . Lumbar degenerative disc disease 08/02/2013  . Post-operative pain 07/29/2013  . Osteonecrosis (HCC) 05/05/2013  . Small bowel obstruction 04/17/2013  . Acute renal failure  (HCC) 04/17/2013  . Medial meniscus, posterior horn derangement 02/17/2013  . Knee sprain and strain 02/17/2013  . Trigger point with neck pain 03/18/2012  . Rotator cuff tear arthropathy of right shoulder 09/08/2011  . CAD in native artery 01/09/2011  . Ankle fracture 12/25/2010  . Contusion of shoulder, left 12/25/2010  . PEMPHIGUS VULGARIS 07/02/2010  . MRSA 05/13/2010  . PRURITUS 05/13/2010  . SPINAL STENOSIS, LUMBAR 05/13/2010  . HLD (hyperlipidemia) 04/24/2010  . GASTROESOPHAGEAL REFLUX DISEASE 04/24/2010  . VENTRAL HERNIA, INCISIONAL 04/24/2010  . DEGENERATIVE JOINT DISEASE 04/24/2010  . PYOGENIC ARTHRITIS, SHOULDER REGION 02/04/2010  . Essential hypertension 01/01/2010  . RUPTURE ROTATOR CUFF 02/19/2009   Virgina Organ, PT CLT [phone] 05/19/2016, 9:02 AM  [facility] [address] [place], [place], [address] Phone:  [phone]   Fax:  [phone]  Name: [name] MRN: [id number] Date of Birth: 1941-04-20

## 2016-05-22 ENCOUNTER — Ambulatory Visit (HOSPITAL_COMMUNITY): Payer: Medicare Other | Admitting: Physical Therapy

## 2016-05-22 DIAGNOSIS — M6281 Muscle weakness (generalized): Secondary | ICD-10-CM | POA: Diagnosis not present

## 2016-05-22 DIAGNOSIS — R2681 Unsteadiness on feet: Secondary | ICD-10-CM | POA: Diagnosis not present

## 2016-05-22 DIAGNOSIS — R262 Difficulty in walking, not elsewhere classified: Secondary | ICD-10-CM

## 2016-05-22 DIAGNOSIS — R29898 Other symptoms and signs involving the musculoskeletal system: Secondary | ICD-10-CM | POA: Diagnosis not present

## 2016-05-22 NOTE — Therapy (Signed)
[facility] [address] [place], [place], [address] Phone: [phone]   Fax:  [phone]  Physical Therapy Treatment  Patient Details  Name: [name] MRN: [id number] Date of Birth: [birth year] Referring Provider: [name]   Encounter Date: 05/22/2016      PT End of Session - 05/22/16 0859    Visit Number 22   Number of Visits 32   Date for PT Re-Evaluation 06/18/16   Authorization Type Medicare/[employer]    Authorization Time Period 9/11/20017-06/06/2016   g code done on visit 16   Authorization - Visit Number 22   Authorization - Number of Visits 26   PT Start Time [phone]   PT Stop Time 0859   PT Time Calculation (min) 43 min   Equipment Utilized During Treatment Gait belt  knee brace   Activity Tolerance Patient tolerated treatment well   Behavior During Therapy WFL for tasks assessed/performed      Past Medical History:  Diagnosis Date  . Acute renal failure (HCC) 04/17/2013  . Anemia, iron deficiency 09/09/2013  . Anxiety   . Arthritis   . 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 Coronary artery disease    IN 2000   STENT PLACED IN 2012 sees Dr. Dietrich Pates, saw last 2013  . Disorder of blood    BEEN TREATED BY DERMATOLOGIST X 4 YRS..."BLOOD BLISTERS"  . Gout   . HTN (hypertension)    sees [name] in [place]  . Hx MRSA infection    rt shoulder  . Hyperlipidemia   . Pneumonia    "I've had it 3-4 times"  . Small bowel obstruction   . Small bowel problem    HAD ALOT OF SCAR TISSUE FROM PREVIOUS SURGERIES..NG WAS INSERTED ...Marland KitchenMarland KitchenNO SURGERY NEEDED...Marland KitchenMarland KitchenIN FOR 8 DAYS  . Ventral hernia     Past Surgical History:  Procedure Laterality Date  . ANTERIOR CERVICAL CORPECTOMY  12/17/11  . ANTERIOR CERVICAL CORPECTOMY  12/17/2011   Procedure: ANTERIOR CERVICAL CORPECTOMY;  Surgeon: Karn Cassis, MD;  Location: [facility];  Service: Neurosurgery;  Laterality: N/A;  Anterior  Cervical Decompression Fusion Five to Thoracic Two with plating  . BACK SURGERY     lumbar  . CATARACT EXTRACTION W/ INTRAOCULAR LENS  IMPLANT, BILATERAL  ? 2011  . CHOLECYSTECTOMY  2006 "or after"  . CORONARY ANGIOPLASTY WITH STENT PLACEMENT  2012  . CORONARY ARTERY BYPASS GRAFT  2000   CABG X5  . EYE SURGERY     bilateral cataract  . INCISION AND DRAINAGE OF WOUND  ~ 20ll; 12/03/11   "had infection in my right"  . LUMBAR LAMINECTOMY/DECOMPRESSION MICRODISCECTOMY  06/13/2011   Procedure: LUMBAR LAMINECTOMY/DECOMPRESSION MICRODISCECTOMY;  Surgeon: Karn Cassis;  Location: [facility];  Service: Neurosurgery;  Laterality: N/A;  Lumbar three, lumbar four-five Laminectomy  . LUMBAR LAMINECTOMY/DECOMPRESSION MICRODISCECTOMY Right 06/17/2013   Procedure: LUMBAR LAMINECTOMY/DECOMPRESSION MICRODISCECTOMY 1 LEVEL;  Surgeon: Karn Cassis, MD;  Location: [facility];  Service: Neurosurgery;  Laterality: Right;  Right L3-4 Microdiskectomy  . LUMBAR WOUND DEBRIDEMENT N/A 08/02/2013   Procedure: INCISION AND DRAINAGE OF LUMBAR WOUND DEBRIDEMENT;  Surgeon: Karn Cassis, MD;  Location: [facility];  Service: Neurosurgery;  Laterality: N/A;  . PERIPHERALLY INSERTED CENTRAL CATHETER INSERTION  2011 & 11/2011  . SHOULDER ARTHROSCOPY Left 09/13/2013   Procedure: ARTHROSCOPIC IRRIGATION AND DEBRIDEMENT, SYNOVECTOMY ,  LEFT SHOULDER ;  Surgeon: Dyke Brackett  Montez Hageman., MD;  Location: [facility];  Service: Orthopedics;  Laterality: Left;  . SHOULDER OPEN ROTATOR CUFF REPAIR  ~ 2011   right  . STERNAL SURG  2000   HAS HAD 5-6 ON HIS STERNUM; "caught MRSA in it"  . TONSILLECTOMY  1949    There were no vitals filed for this visit.      Subjective Assessment - 05/22/16 0817    Subjective PT states that he has been doing okay but has not gotten back into the routine of doing his exercises    Pertinent History beta blockers, CHF/CAD, HTN, history of lumbar laminectomy, CABG, history of ventral hernia, history of  AVN B hips, history of cervical corpectomy, brace secondary to no sternum   Limitations Standing;Lifting;House hold activities   How long can you sit comfortably? no problem    How long can you stand comfortably? with walker he is able to stand quite a while but has not measured    How long can you walk comfortably? with walker he is able to walk for 10-15 minutes    Patient Stated Goals get leg working better, get walking    Pain Score 3    Pain Location Back   Pain Orientation Right   Pain Descriptors / Indicators Burning;Throbbing   Pain Type Chronic pain   Pain Radiating Towards Rt buttock   Pain Onset More than a month ago   Pain Frequency Constant   Aggravating Factors  weight bearing    Pain Relieving Factors heat    Effect of Pain on Daily Activities increases                          [facility] Adult PT Treatment/Exercise - 05/22/16 0001      Lumbar Exercises: Stretches   Prone Mid Back Stretch 2 reps;30 seconds   Quad Stretch 2 reps;30 seconds     Lumbar Exercises: Seated   Sit to Stand 5 reps     Lumbar Exercises: Supine   Bridge 15 reps  x 2 sets   Bridge Limitations 5 second hold with G TB on the 2nd set.    Other Supine Lumbar Exercises adductionx10     Lumbar Exercises: Sidelying   Hip Abduction 10 reps   Hip Abduction Weights (lbs) AA on Lt ; AROM on Rt   Other Sidelying Lumbar Exercises knee flexion B Rt with 2#; hip extension B Rt with 2 # x 10 reps each ; Ankle AAROM x 10 for Dorsi/plantarflexion      Lumbar Exercises: Prone   Straight Leg Raise 10 reps   Straight Leg Raises Limitations Lt with knee bent to 90 degrees;    Other Prone Lumbar Exercises AA ham curl B x 10    Other Prone Lumbar Exercises w-back; heel squeeze x 10      Lumbar Exercises: Quadruped   Other Quadruped Lumbar Exercises crawling x 2    Other Quadruped Lumbar Exercises tall kneeling balance activity      Moist Heat Therapy   Number Minutes Moist Heat 10 Minutes    Moist Heat Location Lumbar Spine                  PT Short Term Goals - 04/24/16 1610      PT SHORT TERM GOAL #1   Title Patient to be able to ambulate at least 448ft with rollator and KAFO in order to demonstrate improvement in mobilty  Baseline 7/11- patient reports he is not doing them every day but has started working out again    Time 3   Period Weeks   Status On-going     PT SHORT TERM GOAL #2   Title Patient to be able to tolerate CKC exercise with KAFO unlocked and minimal unsteadiness/instability in order to demonstrate improvement in condition    Baseline 7/11- requires UE support to stand    Time 3   Period Weeks   Status Achieved     PT SHORT TERM GOAL #3   Title Patient to be able to statically stand with KAFO locked and no UE support in order to demonstrate improved balance    Time 3   Period Weeks   Status On-going     PT SHORT TERM GOAL #4   Title Patient will  consistetly perform appropriate HEP correctly, to be updated PRN    Baseline 7/11- patient states pain still getting around 5/10   Time 3   Period Weeks   Status Achieved           PT Long Term Goals - 04/24/16 [id number]      PT LONG TERM GOAL #1   Title Patient will demonstrate L LE strength at least 4-/5 and 5/5 muslce strength in all other tested groups in order to assist in improving gait and stablity    Time 6   Period Weeks   Status On-going     PT LONG TERM GOAL #2   Title Patient to be able to ambulate and perform functional mobility with AFO and rollator rather than KAFO in order to demonstrate improved strength and mobiltiy    Time 6   Period Weeks   Status On-going     PT LONG TERM GOAL #3   Title Patient to be able to complete TUG balance test in 18 seconds or less wtih rollator in order to demonstrate improved mobility and reduced fall risk    Baseline 8/10: 26 seconds ( significant time to unlock and lock brace   Time 6   Period Weeks   Status On-going     PT LONG  TERM GOAL #4   Title Patient to be able to ambulate at least 63650ft with  rollator during 3MWT in order to demonstrate improved overall mobilty    Time 6   Period Weeks   Status On-going               Plan - 05/22/16 0859    Clinical Impression Statement Attempted tall kneeling this session but this was to advanced for patient at this time but pt was able to complete crawling activity with ease.  Prone exercises were difficulty for pt.  HMP placed on low back with prone activity.  Pt needs assist with Lt LE.    Rehab Potential Good   PT Frequency 2x / week   PT Duration --  16 weeks   PT Treatment/Interventions ADLs/Self Care Home Management;Electrical Stimulation;Gait training;Stair training;Functional mobility training;Therapeutic activities;Therapeutic exercise;Balance training;Neuromuscular re-education;Patient/family education;Manual techniques;Energy conservation;Taping   PT Next Visit Plan begin quadriped activtiy next treatment       Patient will benefit from skilled therapeutic intervention in order to improve the following deficits and impairments:  Abnormal gait, Decreased activity tolerance, Decreased strength, Decreased balance, Decreased mobility, Difficulty walking, Improper body mechanics, Decreased coordination, Impaired flexibility, Postural dysfunction  Visit Diagnosis: Difficulty in walking, not elsewhere classified  Muscle weakness (generalized)  Unsteadiness on feet  Problem List Patient Active Problem List   Diagnosis Date Noted  . 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 Infection of urinary tract 10/26/2015  . Intractable back pain 10/22/2015  . Back pain 10/22/2015  . Muscle weakness (generalized) 12/05/2013  . Tight fascia 12/05/2013  . Decreased range of motion of right shoulder 12/05/2013  . Decreased range of motion of left shoulder 12/05/2013  . Bilateral leg weakness 11/21/2013  . Poor balance 11/21/2013  . Difficulty in  walking(719.7) 11/21/2013  . Shoulder pain, left 09/12/2013  . Anemia, iron deficiency 09/09/2013  . Dyspnea 09/07/2013  . UTI (urinary tract infection) 09/07/2013  . Acute CHF (congestive heart failure) (HCC) 09/07/2013  . Elevated troponin 09/07/2013  . Fever and chills 09/06/2013  . Altered mental status 09/05/2013  . Neurogenic bowel 08/22/2013  . 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 Gout flare 08/22/2013  . Postoperative wound infection 08/22/2013  . Lumbar degenerative disc disease 08/02/2013  . Post-operative pain 07/29/2013  . Osteonecrosis (HCC) 05/05/2013  . Small bowel obstruction 04/17/2013  . Acute renal failure (HCC) 04/17/2013  . Medial meniscus, posterior horn derangement 02/17/2013  . Knee sprain and strain 02/17/2013  . Trigger point with neck pain 03/18/2012  . Rotator cuff tear arthropathy of right shoulder 09/08/2011  . CAD in native artery 01/09/2011  . Ankle fracture 12/25/2010  . Contusion of shoulder, left 12/25/2010  . PEMPHIGUS VULGARIS 07/02/2010  . MRSA 05/13/2010  . PRURITUS 05/13/2010  . SPINAL STENOSIS, LUMBAR 05/13/2010  . HLD (hyperlipidemia) 04/24/2010  . GASTROESOPHAGEAL REFLUX DISEASE 04/24/2010  . VENTRAL HERNIA, INCISIONAL 04/24/2010  . DEGENERATIVE JOINT DISEASE 04/24/2010  . PYOGENIC ARTHRITIS, SHOULDER REGION 02/04/2010  . Essential hypertension 01/01/2010  . RUPTURE ROTATOR CUFF 02/19/2009    Virgina Organ, PT CLT [phone] 05/22/2016, 9:02 AM  [facility] [address] [place], [place], [address] Phone: [phone]   Fax:  [phone]  Name: [name] MRN: [id number] Date of Birth: [birth year]

## 2016-05-26 ENCOUNTER — Encounter (HOSPITAL_COMMUNITY): Payer: Medicare Other | Admitting: Physical Therapy

## 2016-05-26 DIAGNOSIS — R3915 Urgency of urination: Secondary | ICD-10-CM | POA: Diagnosis not present

## 2016-05-26 DIAGNOSIS — K5901 Slow transit constipation: Secondary | ICD-10-CM | POA: Diagnosis not present

## 2016-05-26 DIAGNOSIS — N319 Neuromuscular dysfunction of bladder, unspecified: Secondary | ICD-10-CM | POA: Diagnosis not present

## 2016-05-26 DIAGNOSIS — N390 Urinary tract infection, site not specified: Secondary | ICD-10-CM | POA: Diagnosis not present

## 2016-05-27 ENCOUNTER — Ambulatory Visit (HOSPITAL_COMMUNITY): Payer: Medicare Other | Admitting: Physical Therapy

## 2016-05-27 ENCOUNTER — Telehealth (HOSPITAL_COMMUNITY): Payer: Self-pay | Admitting: Pulmonary Disease

## 2016-05-27 NOTE — Telephone Encounter (Signed)
05/27/16 he said he couldn't come in today so we rescheduled the appt.

## 2016-05-29 ENCOUNTER — Ambulatory Visit (HOSPITAL_COMMUNITY): Payer: Medicare Other | Attending: Pulmonary Disease

## 2016-05-29 ENCOUNTER — Telehealth (HOSPITAL_COMMUNITY): Payer: Self-pay

## 2016-05-29 DIAGNOSIS — R2681 Unsteadiness on feet: Secondary | ICD-10-CM | POA: Insufficient documentation

## 2016-05-29 DIAGNOSIS — M6281 Muscle weakness (generalized): Secondary | ICD-10-CM | POA: Insufficient documentation

## 2016-05-29 DIAGNOSIS — R262 Difficulty in walking, not elsewhere classified: Secondary | ICD-10-CM | POA: Insufficient documentation

## 2016-05-29 DIAGNOSIS — R29898 Other symptoms and signs involving the musculoskeletal system: Secondary | ICD-10-CM | POA: Insufficient documentation

## 2016-05-29 NOTE — Telephone Encounter (Signed)
No show, called and spoke to pt. who stated he though next apt was on Monday.  Pt reminded of next apt which is scheduled for Wednesday 8th, at 9:00.  contact info given.  [address]Wyoma Genson, LPTA; CBIS [phone]

## 2016-06-04 ENCOUNTER — Ambulatory Visit (HOSPITAL_COMMUNITY): Payer: Medicare Other

## 2016-06-04 DIAGNOSIS — R262 Difficulty in walking, not elsewhere classified: Secondary | ICD-10-CM

## 2016-06-04 DIAGNOSIS — M6281 Muscle weakness (generalized): Secondary | ICD-10-CM | POA: Diagnosis not present

## 2016-06-04 DIAGNOSIS — R2681 Unsteadiness on feet: Secondary | ICD-10-CM

## 2016-06-04 DIAGNOSIS — R29898 Other symptoms and signs involving the musculoskeletal system: Secondary | ICD-10-CM | POA: Diagnosis not present

## 2016-06-04 NOTE — Therapy (Signed)
[facility] [address] [place], [place], [address] Phone: [phone]   Fax:  [phone]  Physical Therapy Treatment  Patient Details  Name: [name] MRN: [id number] Date of Birth: 07/25/1941 Referring Provider: Kari BaarsEdward Hawkins  Encounter Date: 06/04/2016      PT End of Session - 06/04/16 0909    Visit Number 23   Number of Visits 32   Date for PT Re-Evaluation 06/18/16   Authorization Type Medicare/[employer]    Authorization Time Period 9/11/20017-06/06/2016   g code done on visit 16   Authorization - Visit Number 23   Authorization - Number of Visits 26   PT Start Time 0903   PT Stop Time 0942   PT Time Calculation (min) 39 min   Equipment Utilized During Treatment Gait belt   Activity Tolerance Patient tolerated treatment well   Behavior During Therapy [facility] for tasks assessed/performed      Past Medical History:  Diagnosis Date  . Acute renal failure (HCC) 04/17/2013  . Anemia, iron deficiency 09/09/2013  . Anxiety   . Arthritis   . 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 Coronary artery disease    IN 2000   STENT PLACED IN 2012 sees Dr. Dietrich Patesothbart, saw last 2013  . Disorder of blood    BEEN TREATED BY DERMATOLOGIST X 4 YRS..."BLOOD BLISTERS"  . Gout   . HTN (hypertension)    [place]  . Hx MRSA infection    rt shoulder  . Hyperlipidemia   . Pneumonia    "I've had it 3-4 times"  . Small bowel obstruction   . Small bowel problem    HAD ALOT OF SCAR TISSUE FROM PREVIOUS SURGERIES..NG WAS INSERTED ...Marland Kitchen.Marland Kitchen.NO SURGERY NEEDED...Marland Kitchen.Marland Kitchen.IN FOR 8 DAYS  . Ventral hernia     Past Surgical History:  Procedure Laterality Date  . ANTERIOR CERVICAL CORPECTOMY  12/17/11  . ANTERIOR CERVICAL CORPECTOMY  12/17/2011   Procedure: ANTERIOR CERVICAL CORPECTOMY;  Surgeon: Karn CassisErnesto M Botero, MD;  Location: [facility];  Service: Neurosurgery;  Laterality: N/A;  Anterior Cervical  Decompression Fusion Five to Thoracic Two with plating  . BACK SURGERY     lumbar  . CATARACT EXTRACTION W/ INTRAOCULAR LENS  IMPLANT, BILATERAL  ? 2011  . CHOLECYSTECTOMY  2006 "or after"  . CORONARY ANGIOPLASTY WITH STENT PLACEMENT  2012  . CORONARY ARTERY BYPASS GRAFT  2000   CABG X5  . EYE SURGERY     bilateral cataract  . INCISION AND DRAINAGE OF WOUND  ~ 20ll; 12/03/11   "had infection in my right"  . LUMBAR LAMINECTOMY/DECOMPRESSION MICRODISCECTOMY  06/13/2011   Procedure: LUMBAR LAMINECTOMY/DECOMPRESSION MICRODISCECTOMY;  Surgeon: Karn CassisErnesto M Botero;  Location: [facility];  Service: Neurosurgery;  Laterality: N/A;  Lumbar three, lumbar four-five Laminectomy  . LUMBAR LAMINECTOMY/DECOMPRESSION MICRODISCECTOMY Right 06/17/2013   Procedure: LUMBAR LAMINECTOMY/DECOMPRESSION MICRODISCECTOMY 1 LEVEL;  Surgeon: Karn CassisErnesto M Botero, MD;  Location: [facility];  Service: Neurosurgery;  Laterality: Right;  Right L3-4 Microdiskectomy  . LUMBAR WOUND DEBRIDEMENT N/A 08/02/2013   Procedure: INCISION AND DRAINAGE OF LUMBAR WOUND DEBRIDEMENT;  Surgeon: Karn CassisErnesto M Botero, MD;  Location: [facility];  Service: Neurosurgery;  Laterality: N/A;  . PERIPHERALLY INSERTED CENTRAL CATHETER INSERTION  2011 & 11/2011  . SHOULDER ARTHROSCOPY Left 09/13/2013   Procedure: ARTHROSCOPIC IRRIGATION AND DEBRIDEMENT, SYNOVECTOMY ,  LEFT SHOULDER ;  Surgeon: Thera FlakeW D Caffrey Jr., MD;  Location:  [facility];  Service: Orthopedics;  Laterality: Left;  . SHOULDER OPEN ROTATOR CUFF REPAIR  ~ 2011   right  . STERNAL SURG  2000   HAS HAD 5-6 ON HIS STERNUM; "caught MRSA in it"  . TONSILLECTOMY  1949    There were no vitals filed for this visit.      Subjective Assessment - 06/04/16 0902    Subjective Pt stated he is feeling very weak, LBP scale 5/10.  Pt stated he has not returned to doing all his HEP since getting sick, still on antibiotics   Pertinent History beta blockers, CHF/CAD, HTN, history of lumbar laminectomy, CABG, history  of ventral hernia, history of AVN B hips, history of cervical corpectomy, brace secondary to no sternum   Patient Stated Goals get leg working better, get walking    Currently in Pain? Yes   Pain Score 5    Pain Location Back   Pain Orientation Left   Pain Descriptors / Indicators Sharp   Pain Type Chronic pain   Pain Onset More than a month ago   Pain Frequency Constant   Aggravating Factors  weight bearing   Pain Relieving Factors heat   Effect of Pain on Daily Activities increases            [facility] PT Assessment - 06/04/16 0001      Assessment   Medical Diagnosis back fractuer of second lumbar vertebra    Referring Provider [name]   Onset Date/Surgical Date --  about 3 months ago   Next MD Visit [name] in 6 weeks              [facility] Adult PT Treatment/Exercise - 06/04/16 0001      Lumbar Exercises: Stretches   Prone on Elbows Stretch 2 reps;30 seconds   Quad Stretch 3 reps;30 seconds     Lumbar Exercises: Supine   Bridge 15 reps  feet on [employer] Limitations 5 second hold with G TB on the 2nd set.      Lumbar Exercises: Quadruped   Single Arm Raise Right;Left;5 reps   Straight Leg Raise 5 reps;3 seconds  AA with Lt LE   Other Quadruped Lumbar Exercises crawling x 5   Other Quadruped Lumbar Exercises tall kneeling balance activity 2 sets 1 min each with intermittent HHA     Moist Heat Therapy   Number Minutes Moist Heat 10 Minutes   Moist Heat Location Lumbar Spine  during prone therex                  PT Short Term Goals - 04/24/16 1610      PT SHORT TERM GOAL #1   Title Patient to be able to ambulate at least 442ft with rollator and KAFO in order to demonstrate improvement in mobilty    Baseline 7/11- patient reports he is not doing them every day but has started working out again    Time 3   Period Weeks   Status On-going     PT SHORT TERM GOAL #2   Title Patient to be able to tolerate CKC exercise with KAFO  unlocked and minimal unsteadiness/instability in order to demonstrate improvement in condition    Baseline 7/11- requires UE support to stand    Time 3   Period Weeks   Status Achieved     PT SHORT TERM GOAL #3   Title Patient to be able to statically stand with KAFO locked and no UE  support in order to demonstrate improved balance    Time 3   Period Weeks   Status On-going     PT SHORT TERM GOAL #4   Title Patient will  consistetly perform appropriate HEP correctly, to be updated PRN    Baseline 7/11- patient states pain still getting around 5/10   Time 3   Period Weeks   Status Achieved           PT Long Term Goals - 04/24/16 7829      PT LONG TERM GOAL #1   Title Patient will demonstrate L LE strength at least 4-/5 and 5/5 muslce strength in all other tested groups in order to assist in improving gait and stablity    Time 6   Period Weeks   Status On-going     PT LONG TERM GOAL #2   Title Patient to be able to ambulate and perform functional mobility with AFO and rollator rather than KAFO in order to demonstrate improved strength and mobiltiy    Time 6   Period Weeks   Status On-going     PT LONG TERM GOAL #3   Title Patient to be able to complete TUG balance test in 18 seconds or less wtih rollator in order to demonstrate improved mobility and reduced fall risk    Baseline 8/10: 26 seconds ( significant time to unlock and lock brace   Time 6   Period Weeks   Status On-going     PT LONG TERM GOAL #4   Title Patient to be able to ambulate at least 661ft with  rollator during in order to demonstrate improved overall mobilty    Time 6   Period Weeks   Status On-going               Plan - 06/04/16 0930    Clinical Impression Statement Progressed to quadruped exercises this session, cueing for form and AA with Lt LE due to weakness.  MHP placed on lower back during prone activities today for pain control with passive stretches to LE.  Pt able to  tolerate 1 min tall kneeling with intermittent HHA required.  EOS pt reports pain reduced   Rehab Potential Good   PT Frequency 2x / week   PT Treatment/Interventions ADLs/Self Care Home Management;Electrical Stimulation;Gait training;Stair training;Functional mobility training;Therapeutic activities;Therapeutic exercise;Balance training;Neuromuscular re-education;Patient/family education;Manual techniques;Energy conservation;Taping   PT Next Visit Plan continue quadruped and tall kneeling activities      Patient will benefit from skilled therapeutic intervention in order to improve the following deficits and impairments:  Abnormal gait, Decreased activity tolerance, Decreased strength, Decreased balance, Decreased mobility, Difficulty walking, Improper body mechanics, Decreased coordination, Impaired flexibility, Postural dysfunction  Visit Diagnosis: Difficulty in walking, not elsewhere classified  Muscle weakness (generalized)  Unsteadiness on feet     Problem List Patient Active Problem List   Diagnosis Date Noted  . 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 Infection of urinary tract 10/26/2015  . Intractable back pain 10/22/2015  . Back pain 10/22/2015  . Muscle weakness (generalized) 12/05/2013  . Tight fascia 12/05/2013  . Decreased range of motion of right shoulder 12/05/2013  . Decreased range of motion of left shoulder 12/05/2013  . Bilateral leg weakness 11/21/2013  . Poor balance 11/21/2013  . Difficulty in walking(719.7) 11/21/2013  . Shoulder pain, left 09/12/2013  . Anemia, iron deficiency 09/09/2013  . Dyspnea 09/07/2013  . UTI (urinary tract infection) 09/07/2013  . Acute CHF (  congestive heart failure) (HCC) 09/07/2013  . Elevated troponin 09/07/2013  . Fever and chills 09/06/2013  . Altered mental status 09/05/2013  . Neurogenic bowel 08/22/2013  . 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 Gout flare 08/22/2013  .  Postoperative wound infection 08/22/2013  . Lumbar degenerative disc disease 08/02/2013  . Post-operative pain 07/29/2013  . Osteonecrosis (HCC) 05/05/2013  . Small bowel obstruction 04/17/2013  . Acute renal failure (HCC) 04/17/2013  . Medial meniscus, posterior horn derangement 02/17/2013  . Knee sprain and strain 02/17/2013  . Trigger point with neck pain 03/18/2012  . Rotator cuff tear arthropathy of right shoulder 09/08/2011  . CAD in native artery 01/09/2011  . Ankle fracture 12/25/2010  . Contusion of shoulder, left 12/25/2010  . PEMPHIGUS VULGARIS 07/02/2010  . MRSA 05/13/2010  . PRURITUS 05/13/2010  . SPINAL STENOSIS, LUMBAR 05/13/2010  . HLD (hyperlipidemia) 04/24/2010  . GASTROESOPHAGEAL REFLUX DISEASE 04/24/2010  . VENTRAL HERNIA, INCISIONAL 04/24/2010  . DEGENERATIVE JOINT DISEASE 04/24/2010  . PYOGENIC ARTHRITIS, SHOULDER REGION 02/04/2010  . Essential hypertension 01/01/2010  . RUPTURE ROTATOR CUFF 02/19/2009   Becky Saxasey Mercede Rollo, LPTA; CBIS [phone]  Juel BurrowCockerham, Orvis Stann Jo 06/04/2016, 9:45 AM  [facility] [address] [place], [place], [address] Phone: [phone]   Fax:  [phone]  Name: [name] MRN: [id number] Date of Birth: 04/18/1941

## 2016-06-06 ENCOUNTER — Ambulatory Visit (HOSPITAL_COMMUNITY): Payer: Medicare Other

## 2016-06-06 DIAGNOSIS — R262 Difficulty in walking, not elsewhere classified: Secondary | ICD-10-CM | POA: Diagnosis not present

## 2016-06-06 DIAGNOSIS — K5900 Constipation, unspecified: Secondary | ICD-10-CM | POA: Diagnosis not present

## 2016-06-06 DIAGNOSIS — N390 Urinary tract infection, site not specified: Secondary | ICD-10-CM | POA: Diagnosis not present

## 2016-06-06 DIAGNOSIS — R2681 Unsteadiness on feet: Secondary | ICD-10-CM | POA: Diagnosis not present

## 2016-06-06 DIAGNOSIS — K5901 Slow transit constipation: Secondary | ICD-10-CM | POA: Diagnosis not present

## 2016-06-06 DIAGNOSIS — R29898 Other symptoms and signs involving the musculoskeletal system: Secondary | ICD-10-CM

## 2016-06-06 DIAGNOSIS — M6281 Muscle weakness (generalized): Secondary | ICD-10-CM

## 2016-06-06 NOTE — Therapy (Signed)
[facility] [address] [place], [place], [address] Phone: [phone]   Fax:  [phone]  Physical Therapy Treatment  Patient Details  Name: [name] MRN: [id number] Date of Birth: [birth year] Referring Provider: [name]  Encounter Date: 06/06/2016      PT End of Session - 06/06/16 1024    Visit Number 24   Number of Visits 32   Date for PT Re-Evaluation 06/18/16   Authorization Type Medicare/[employer]    Authorization Time Period 9/11/20017-06/06/2016   g code done on visit 16   Authorization - Visit Number 24   Authorization - Number of Visits 26   PT Start Time [phone]   PT Stop Time 1026   PT Time Calculation (min) 40 min   Activity Tolerance Patient tolerated treatment well   Behavior During Therapy [facility] for tasks assessed/performed      Past Medical History:  Diagnosis Date  . Acute renal failure (HCC) 04/17/2013  . Anemia, iron deficiency 09/09/2013  . Anxiety   . Arthritis   . 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 Coronary artery disease    IN 2000   STENT PLACED IN 2012 sees Dr. Dietrich Pates, saw last 2013  . Disorder of blood    BEEN TREATED BY DERMATOLOGIST X 4 YRS..."BLOOD BLISTERS"  . Gout   . HTN (hypertension)    sees [name] in [place]  . Hx MRSA infection    rt shoulder  . Hyperlipidemia   . Pneumonia    "I've had it 3-4 times"  . Small bowel obstruction   . Small bowel problem    HAD ALOT OF SCAR TISSUE FROM PREVIOUS SURGERIES..NG WAS INSERTED ...Marland KitchenMarland KitchenNO SURGERY NEEDED...Marland KitchenMarland KitchenIN FOR 8 DAYS  . Ventral hernia     Past Surgical History:  Procedure Laterality Date  . ANTERIOR CERVICAL CORPECTOMY  12/17/11  . ANTERIOR CERVICAL CORPECTOMY  12/17/2011   Procedure: ANTERIOR CERVICAL CORPECTOMY;  Surgeon: Karn Cassis, MD;  Location: [facility];  Service: Neurosurgery;  Laterality: N/A;  Anterior Cervical Decompression Fusion Five to Thoracic Two with plating  .  BACK SURGERY     lumbar  . CATARACT EXTRACTION W/ INTRAOCULAR LENS  IMPLANT, BILATERAL  ? 2011  . CHOLECYSTECTOMY  2006 "or after"  . CORONARY ANGIOPLASTY WITH STENT PLACEMENT  2012  . CORONARY ARTERY BYPASS GRAFT  2000   CABG X5  . EYE SURGERY     bilateral cataract  . INCISION AND DRAINAGE OF WOUND  ~ 20ll; 12/03/11   "had infection in my right"  . LUMBAR LAMINECTOMY/DECOMPRESSION MICRODISCECTOMY  06/13/2011   Procedure: LUMBAR LAMINECTOMY/DECOMPRESSION MICRODISCECTOMY;  Surgeon: Karn Cassis;  [facility]: [facility];  Service: Neurosurgery;  Laterality: N/A;  Lumbar three, lumbar four-five Laminectomy  . LUMBAR LAMINECTOMY/DECOMPRESSION MICRODISCECTOMY Right 06/17/2013   Procedure: LUMBAR LAMINECTOMY/DECOMPRESSION MICRODISCECTOMY 1 LEVEL;  Surgeon: Karn Cassis, MD;  Location: [facility];  Service: Neurosurgery;  Laterality: Right;  Right L3-4 Microdiskectomy  . LUMBAR WOUND DEBRIDEMENT N/A 08/02/2013   Procedure: INCISION AND DRAINAGE OF LUMBAR WOUND DEBRIDEMENT;  Surgeon: Karn Cassis, MD;  [facility]: [facility];  Service: Neurosurgery;  Laterality: N/A;  . PERIPHERALLY INSERTED CENTRAL CATHETER INSERTION  2011 & 11/2011  . SHOULDER ARTHROSCOPY Left 09/13/2013   Procedure: ARTHROSCOPIC IRRIGATION AND DEBRIDEMENT, SYNOVECTOMY ,  LEFT SHOULDER ;  Surgeon: Thera Flake., MD;  Location: [facility];  Service: Orthopedics;  Laterality: Left;  .  SHOULDER OPEN ROTATOR CUFF REPAIR  ~ 2011   right  . STERNAL SURG  2000   HAS HAD 5-6 ON HIS STERNUM; "caught MRSA in it"  . TONSILLECTOMY  1949    There were no vitals filed for this visit.      Subjective Assessment - 06/06/16 0951    Subjective Pt is doing ok today, some mild back pain, but nothing too bad. He is getting used to the time change and is still workign out at [employer]. He says he has one more day on the ABX.   Pertinent History beta blockers, CHF/CAD, HTN, history of lumbar laminectomy, CABG, history of ventral hernia,  history of AVN B hips, history of cervical corpectomy, brace secondary to no sternum   Currently in Pain? Yes   Pain Score 5    Pain Location Back   Pain Orientation Left   Pain Descriptors / Indicators Sharp                         [facility] Adult PT Treatment/Exercise - 06/06/16 0001      Bed Mobility   Bed Mobility --  prone to quadruped-7x supervision level     Lumbar Exercises: Stretches   Prone on Elbows Stretch 2 reps;30 seconds   Quad Stretch 3 reps;30 seconds   Piriformis Stretch Limitations Prone quads stretch- PROM  5x30sec bilat     Lumbar Exercises: Prone   Other Prone Lumbar Exercises tall kneeling quads stretch: 5x30sec  double towel roll under ankles, wedge under butt     Knee/Hip Exercises: Seated   Sit to Sand 2 sets;5 reps                  PT Short Term Goals - 04/24/16 0858      PT SHORT TERM GOAL #1   Title Patient to be able to ambulate at least 480[address]ft with rollator and KAFO in order to demonstrate improvement in mobilty    Baseline 7/11- patient reports he is not doing them every day but has started working out again    Time 3   Period Weeks   Status On-going     PT SHORT TERM GOAL #2   Title Patient to be able to tolerate CKC exercise with KAFO unlocked and minimal unsteadiness/instability in order to demonstrate improvement in condition    Baseline 7/11- requires UE support to stand    Time 3   Period Weeks   Status Achieved     PT SHORT TERM GOAL #3   Title Patient to be able to statically stand with KAFO locked and no UE support in order to demonstrate improved balance    Time 3   Period Weeks   Status On-going     PT SHORT TERM GOAL #4   Title Patient will  consistetly perform appropriate HEP correctly, to be updated PRN    Baseline 7/11- patient states pain still getting around 5/10   Time 3   Period Weeks   Status Achieved           PT Long Term Goals - 04/24/16 [id number]      PT LONG TERM GOAL #1   Title  Patient will demonstrate L LE strength at least 4-/5 and 5/5 muslce strength in all other tested groups in order to assist in improving gait and stablity    Time 6   Period Weeks   Status On-going     PT  LONG TERM GOAL #2   Title Patient to be able to ambulate and perform functional mobility with AFO and rollator rather than KAFO in order to demonstrate improved strength and mobiltiy    Time 6   Period Weeks   Status On-going     PT LONG TERM GOAL #3   Title Patient to be able to complete TUG balance test in 18 seconds or less wtih rollator in order to demonstrate improved mobility and reduced fall risk    Baseline 8/10: 26 seconds ( significant time to unlock and lock brace   Time 6   Period Weeks   Status On-going     PT LONG TERM GOAL #4   Title Patient to be able to ambulate at least 62750ft with  rollator during 3MWT in order to demonstrate improved overall mobilty    Time 6   Period Weeks   Status On-going               Plan - 06/06/16 1025    Clinical Impression Statement Continued with streetching, core strengthening, and postural control in prone, quadruped, and half kneeling. Supervision to modA needed throughout. Pt continues to make progress toward goals, but remain very much challenged with kneeling/half kneeling activity.     Rehab Potential Good   PT Frequency 2x / week   PT Duration --  16 weeks   PT Next Visit Plan continue quadruped and tall kneeling activities   Consulted and Agree with Plan of Care Patient      Patient will benefit from skilled therapeutic intervention in order to improve the following deficits and impairments:  Abnormal gait, Decreased activity tolerance, Decreased strength, Decreased balance, Decreased mobility, Difficulty walking, Improper body mechanics, Decreased coordination, Impaired flexibility, Postural dysfunction  Visit Diagnosis: Difficulty in walking, not elsewhere classified  Muscle weakness  (generalized)  Unsteadiness on feet  Bilateral leg weakness     Problem List Patient Active Problem List   Diagnosis Date Noted  . 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 Infection of urinary tract 10/26/2015  . Intractable back pain 10/22/2015  . Back pain 10/22/2015  . Muscle weakness (generalized) 12/05/2013  . Tight fascia 12/05/2013  . Decreased range of motion of right shoulder 12/05/2013  . Decreased range of motion of left shoulder 12/05/2013  . Bilateral leg weakness 11/21/2013  . Poor balance 11/21/2013  . Difficulty in walking(719.7) 11/21/2013  . Shoulder pain, left 09/12/2013  . Anemia, iron deficiency 09/09/2013  . Dyspnea 09/07/2013  . UTI (urinary tract infection) 09/07/2013  . Acute CHF (congestive heart failure) (HCC) 09/07/2013  . Elevated troponin 09/07/2013  . Fever and chills 09/06/2013  . Altered mental status 09/05/2013  . Neurogenic bowel 08/22/2013  . 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 Gout flare 08/22/2013  . Postoperative wound infection 08/22/2013  . Lumbar degenerative disc disease 08/02/2013  . Post-operative pain 07/29/2013  . Osteonecrosis (HCC) 05/05/2013  . Small bowel obstruction 04/17/2013  . Acute renal failure (HCC) 04/17/2013  . Medial meniscus, posterior horn derangement 02/17/2013  . Knee sprain and strain 02/17/2013  . Trigger point with neck pain 03/18/2012  . Rotator cuff tear arthropathy of right shoulder 09/08/2011  . CAD in native artery 01/09/2011  . Ankle fracture 12/25/2010  . Contusion of shoulder, left 12/25/2010  . PEMPHIGUS VULGARIS 07/02/2010  . MRSA 05/13/2010  . PRURITUS 05/13/2010  . SPINAL STENOSIS, LUMBAR 05/13/2010  . HLD (hyperlipidemia) 04/24/2010  . GASTROESOPHAGEAL REFLUX  DISEASE 04/24/2010  . VENTRAL HERNIA, INCISIONAL 04/24/2010  . DEGENERATIVE JOINT DISEASE 04/24/2010  . PYOGENIC ARTHRITIS, SHOULDER REGION 02/04/2010  . Essential hypertension  01/01/2010  . RUPTURE ROTATOR CUFF 02/19/2009   10:32 AM, 06/06/16 Rosamaria Lints, PT, DPT Physical Therapist at [facility] [phone] (office)     [facility] [address] [place], [place], [address] Phone: [phone]   Fax:  [phone]  Name: [name] MRN: [id number] Date of Birth: 01-07-1941

## 2016-06-09 ENCOUNTER — Ambulatory Visit (HOSPITAL_COMMUNITY): Payer: Medicare Other | Admitting: Physical Therapy

## 2016-06-09 DIAGNOSIS — M6281 Muscle weakness (generalized): Secondary | ICD-10-CM | POA: Diagnosis not present

## 2016-06-09 DIAGNOSIS — R29898 Other symptoms and signs involving the musculoskeletal system: Secondary | ICD-10-CM

## 2016-06-09 DIAGNOSIS — R262 Difficulty in walking, not elsewhere classified: Secondary | ICD-10-CM | POA: Diagnosis not present

## 2016-06-09 DIAGNOSIS — R2681 Unsteadiness on feet: Secondary | ICD-10-CM

## 2016-06-09 NOTE — Therapy (Signed)
[place] [facility] [address] [place], [place], [address] Phone: [phone]   Fax:  [phone]  Physical Therapy Treatment  Patient Details  Name: [name] MRN: [id number] Date of Birth: Mar 18, 1941 Referring Provider: Guy Sandifer   Encounter Date: 06/09/2016      PT End of Session - 06/09/16 0934    Visit Number 25   Number of Visits 32   Date for PT Re-Evaluation 06/18/16   Authorization Type Medicare/[employer]    Authorization Time Period 9/11/20017-06/06/2016   g code done on visit 16   Authorization - Visit Number 25   Authorization - Number of Visits 32   PT Start Time 0900   PT Stop Time 0945   PT Time Calculation (min) 45 min   Activity Tolerance Patient tolerated treatment well   Behavior During Therapy [facility] for tasks assessed/performed      Past Medical History:  Diagnosis Date  . Acute renal failure (HCC) 04/17/2013  . Anemia, iron deficiency 09/09/2013  . Anxiety   . Arthritis   . 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 Coronary artery disease    IN 2000   STENT PLACED IN 2012 sees Dr. Dietrich Pates, saw last 2013  . Disorder of blood    BEEN TREATED BY DERMATOLOGIST X 4 YRS..."BLOOD BLISTERS"  . Gout   . HTN (hypertension)    sees [name] in [place]  . Hx MRSA infection    rt shoulder  . Hyperlipidemia   . Pneumonia    "I've had it 3-4 times"  . Small bowel obstruction   . Small bowel problem    HAD ALOT OF SCAR TISSUE FROM PREVIOUS SURGERIES..NG WAS INSERTED ...Marland KitchenMarland KitchenNO SURGERY NEEDED...Marland KitchenMarland KitchenIN FOR 8 DAYS  . Ventral hernia     Past Surgical History:  Procedure Laterality Date  . ANTERIOR CERVICAL CORPECTOMY  12/17/11  . ANTERIOR CERVICAL CORPECTOMY  12/17/2011   Procedure: ANTERIOR CERVICAL CORPECTOMY;  Surgeon: Karn Cassis, MD;  Location: [facility];  Service: Neurosurgery;  Laterality: N/A;  Anterior Cervical Decompression Fusion Five to Thoracic Two with plating  .  BACK SURGERY     lumbar  . CATARACT EXTRACTION W/ INTRAOCULAR LENS  IMPLANT, BILATERAL  ? 2011  . CHOLECYSTECTOMY  2006 "or after"  . CORONARY ANGIOPLASTY WITH STENT PLACEMENT  2012  . CORONARY ARTERY BYPASS GRAFT  2000   CABG X5  . EYE SURGERY     bilateral cataract  . INCISION AND DRAINAGE OF WOUND  ~ 20ll; 12/03/11   "had infection in my right"  . LUMBAR LAMINECTOMY/DECOMPRESSION MICRODISCECTOMY  06/13/2011   Procedure: LUMBAR LAMINECTOMY/DECOMPRESSION MICRODISCECTOMY;  Surgeon: Karn Cassis;  Location: [facility];  Service: Neurosurgery;  Laterality: N/A;  Lumbar three, lumbar four-five Laminectomy  . LUMBAR LAMINECTOMY/DECOMPRESSION MICRODISCECTOMY Right 06/17/2013   Procedure: LUMBAR LAMINECTOMY/DECOMPRESSION MICRODISCECTOMY 1 LEVEL;  Surgeon: Karn Cassis, MD;  Location: [facility];  Service: Neurosurgery;  Laterality: Right;  Right L3-4 Microdiskectomy  . LUMBAR WOUND DEBRIDEMENT N/A 08/02/2013   Procedure: INCISION AND DRAINAGE OF LUMBAR WOUND DEBRIDEMENT;  Surgeon: Karn Cassis, MD;  Location: [facility];  Service: Neurosurgery;  Laterality: N/A;  . PERIPHERALLY INSERTED CENTRAL CATHETER INSERTION  2011 & 11/2011  . SHOULDER ARTHROSCOPY Left 09/13/2013   Procedure: ARTHROSCOPIC IRRIGATION AND DEBRIDEMENT, SYNOVECTOMY ,  LEFT SHOULDER ;  Surgeon: Thera Flake., MD;  Location: [facility] OR;  Service: Orthopedics;  Laterality:  Left;  . SHOULDER OPEN ROTATOR CUFF REPAIR  ~ 2011   right  . STERNAL SURG  2000   HAS HAD 5-6 ON HIS STERNUM; "caught MRSA in it"  . TONSILLECTOMY  1949    There were no vitals filed for this visit.      Subjective Assessment - 06/09/16 0906    Subjective Pt states that he is still feeling weak, (pt had a virus several weeks ago).   Pertinent History beta blockers, CHF/CAD, HTN, history of lumbar laminectomy, CABG, history of ventral hernia, history of AVN B hips, history of cervical corpectomy, brace secondary to no sternum   How long can you  sit comfortably? no problem    Currently in Pain? Yes   Pain Score 5    Pain Location Back   Pain Orientation Left   Pain Descriptors / Indicators Aching   Pain Type Chronic pain   Pain Onset More than a month ago   Pain Frequency Constant   Aggravating Factors  weight bearing    Pain Relieving Factors sitting down    Effect of Pain on Daily Activities increases             [facility] PT Assessment - 06/09/16 0001      Assessment   Medical Diagnosis back fractuer of second lumbar vertebra    Referring Provider Guy SandiferEdward Hawkings    Onset Date/Surgical Date --  about 3 months ago   Next MD Visit Dr. Juanetta GoslingHawkins in 6 weeks      Prior Function   Level of Independence Independent;Independent with basic ADLs;Independent with gait;Independent with transfers   Vocation Retired   Leisure none, just get back to walking      Strength   Right Hip Flexion 5/5   Right Hip Extension 3-/5  was 2+/5   Right Hip ABduction 3-/5  was 4-/5 : pt has been sick for 3 weeks .    Left Hip Flexion 4+/5  was 4/5    Left Hip Extension 2/5  was 2/5   Left Hip ABduction 3-/5  was 3-/5    Right Knee Flexion 3/5  was 3-/5    Left Knee Flexion 2/5   Left Knee Extension 4/5  was 4/5   Right Ankle Dorsiflexion 3+/5  was 4-//5   Right Ankle Plantar Flexion 2+/5   Left Ankle Dorsiflexion 1/5  was 1/5    Left Ankle Plantar Flexion 2/5     6 minute walk test results    Aerobic Endurance Distance Walked 324  Pt walked a total of 365 but 3' walk test was 67324'   Endurance additional comments 3:00                     [facility] Adult PT Treatment/Exercise - 06/09/16 0001      Lumbar Exercises: Seated   Sit to Stand 5 reps     Lumbar Exercises: Supine   Bridge 15 reps  feet on dynadisk   Bridge Limitations 5 second hold with G TB on the 2nd set.      Lumbar Exercises: Sidelying   Hip Abduction 10 reps   Hip Abduction Weights (lbs) AA on Lt ; AROM on Rt   Hip Abduction Limitations 15 Lt hip  abduction isometric into the mat      Lumbar Exercises: Prone   Straight Leg Raise 15 reps   Straight Leg Raises Limitations Lt knee bent to 90 degrees; Rt straight    Other Prone  Lumbar Exercises AA ham curl on Lt LE; AROM for RT x 15 each                   PT Short Term Goals - 06/09/16 0938      PT SHORT TERM GOAL #1   Title Patient to be able to ambulate at least 467ft with rollator and KAFO in order to demonstrate improvement in mobilty    Baseline 7/11- patient reports he is not doing them every day but has started working out again    Time 3   Period Weeks   Status On-going     PT SHORT TERM GOAL #2   Title Patient to be able to tolerate CKC exercise with KAFO unlocked and minimal unsteadiness/instability in order to demonstrate improvement in condition    Baseline 7/11- requires UE support to stand    Time 3   Period Weeks   Status Achieved     PT SHORT TERM GOAL #3   Title Patient to be able to statically stand with KAFO locked and no UE support in order to demonstrate improved balance    Time 3   Period Weeks   Status On-going     PT SHORT TERM GOAL #4   Title Patient will  consistetly perform appropriate HEP correctly, to be updated PRN    Baseline 7/11- patient states pain still getting around 5/10   Time 3   Period Weeks   Status Achieved           PT Long Term Goals - 06/09/16 4098      PT LONG TERM GOAL #1   Title Patient will demonstrate L LE strength at least 4-/5 and 5/5 muslce strength in all other tested groups in order to assist in improving gait and stablity    Time 6   Period Weeks   Status On-going     PT LONG TERM GOAL #2   Title Patient to be able to ambulate and perform functional mobility with AFO and rollator rather than KAFO in order to demonstrate improved strength and mobiltiy    Time 6   Period Weeks   Status On-going     PT LONG TERM GOAL #3   Title Patient to be able to complete TUG balance test in 18 seconds or  less wtih rollator in order to demonstrate improved mobility and reduced fall risk    Baseline 8/10: 26 seconds ( significant time to unlock and lock brace   Time 6   Period Weeks   Status On-going     PT LONG TERM GOAL #4   Title Patient to be able to ambulate at least 66ft with  rollator during in order to demonstrate improved overall mobilty    Time 6   Period Weeks   Status On-going               Plan - 06/09/16 0935    Clinical Impression Statement Pt was gone for two weeks came back one time the next two weeks due to not feeling well.  Pt continues to not feel well and has not returned to eating normally since his illness.  We will continue to see him and reassess his gains in strength in three more weeks but most likely Mr. Neal will be ready for discharge at that time.     Rehab Potential Good   PT Frequency 2x / week   PT Duration --  16 weeks  PT Treatment/Interventions Gait training;Functional mobility training;Therapeutic activities;Therapeutic exercise;Balance training;Patient/family education;Manual techniques   PT Next Visit Plan continue quadruped and tall kneeling activities   Consulted and Agree with Plan of Care Patient      Patient will benefit from skilled therapeutic intervention in order to improve the following deficits and impairments:  Abnormal gait, Decreased activity tolerance, Decreased strength, Decreased balance, Decreased mobility, Difficulty walking, Improper body mechanics, Decreased coordination, Impaired flexibility, Postural dysfunction  Visit Diagnosis: Difficulty in walking, not elsewhere classified - Plan: PT plan of care cert/re-cert  Muscle weakness (generalized) - Plan: PT plan of care cert/re-cert  Unsteadiness on feet - Plan: PT plan of care cert/re-cert  Bilateral leg weakness - Plan: PT plan of care cert/re-cert     Problem List Patient Active Problem List   Diagnosis Date Noted  . Cauda equina spinal cord  injury (HCC) 10/26/2015  . Infection of urinary tract 10/26/2015  . Intractable back pain 10/22/2015  . Back pain 10/22/2015  . Muscle weakness (generalized) 12/05/2013  . Tight fascia 12/05/2013  . Decreased range of motion of right shoulder 12/05/2013  . Decreased range of motion of left shoulder 12/05/2013  . Bilateral leg weakness 11/21/2013  . Poor balance 11/21/2013  . Difficulty in walking(719.7) 11/21/2013  . Shoulder pain, left 09/12/2013  . Anemia, iron deficiency 09/09/2013  . Dyspnea 09/07/2013  . UTI (urinary tract infection) 09/07/2013  . Acute CHF (congestive heart failure) (HCC) 09/07/2013  . Elevated troponin 09/07/2013  . Fever and chills 09/06/2013  . Altered mental status 09/05/2013  . Neurogenic bowel 08/22/2013  . 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 Gout flare 08/22/2013  . Postoperative wound infection 08/22/2013  . Lumbar degenerative disc disease 08/02/2013  . Post-operative pain 07/29/2013  . Osteonecrosis (HCC) 05/05/2013  . Small bowel obstruction 04/17/2013  . Acute renal failure (HCC) 04/17/2013  . Medial meniscus, posterior horn derangement 02/17/2013  . Knee sprain and strain 02/17/2013  . Trigger point with neck pain 03/18/2012  . Rotator cuff tear arthropathy of right shoulder 09/08/2011  . CAD in native artery 01/09/2011  . Ankle fracture 12/25/2010  . Contusion of shoulder, left 12/25/2010  . PEMPHIGUS VULGARIS 07/02/2010  . MRSA 05/13/2010  . PRURITUS 05/13/2010  . SPINAL STENOSIS, LUMBAR 05/13/2010  . HLD (hyperlipidemia) 04/24/2010  . GASTROESOPHAGEAL REFLUX DISEASE 04/24/2010  . VENTRAL HERNIA, INCISIONAL 04/24/2010  . DEGENERATIVE JOINT DISEASE 04/24/2010  . PYOGENIC ARTHRITIS, SHOULDER REGION 02/04/2010  . Essential hypertension 01/01/2010  . RUPTURE ROTATOR CUFF 02/19/2009    Virgina Organynthia Fernandez Kenley, PT CLT [phone] 06/09/2016, 9:52 AM  [facility] [address] [place], [place], [address] Phone: [phone]   Fax:  [phone]  Name: [name] MRN: [id number] Date of Birth: 09/10/1940

## 2016-06-10 DIAGNOSIS — R3915 Urgency of urination: Secondary | ICD-10-CM | POA: Diagnosis not present

## 2016-06-10 DIAGNOSIS — K5901 Slow transit constipation: Secondary | ICD-10-CM | POA: Diagnosis not present

## 2016-06-10 DIAGNOSIS — R350 Frequency of micturition: Secondary | ICD-10-CM | POA: Diagnosis not present

## 2016-06-12 ENCOUNTER — Ambulatory Visit (HOSPITAL_COMMUNITY): Payer: Medicare Other

## 2016-06-12 DIAGNOSIS — R262 Difficulty in walking, not elsewhere classified: Secondary | ICD-10-CM

## 2016-06-12 DIAGNOSIS — R2681 Unsteadiness on feet: Secondary | ICD-10-CM

## 2016-06-12 DIAGNOSIS — M6281 Muscle weakness (generalized): Secondary | ICD-10-CM | POA: Diagnosis not present

## 2016-06-12 DIAGNOSIS — R29898 Other symptoms and signs involving the musculoskeletal system: Secondary | ICD-10-CM | POA: Diagnosis not present

## 2016-06-12 NOTE — Therapy (Addendum)
[place] [facility] [address] [place], [place], [address] Phone: [phone]   Fax:  [phone]  Physical Therapy Treatment  Patient Details  Name: Jake MarvelRonald W Milham MRN: [id number] Date of Birth: 10/05/1940 Referring Provider: Guy SandiferEdward Hawkings   Encounter Date: 06/12/2016      PT End of Session - 06/12/16 0954    Visit Number 26   Number of Visits 32   Date for PT Re-Evaluation 06/18/16   Authorization Type Medicare/[employer]    Authorization Time Period 9/11/20017-06/06/2016   g code done on visit 16   Authorization - Visit Number 26   Authorization - Number of Visits 32   PT Start Time [phone]   PT Stop Time 1030   PT Time Calculation (min) 42 min   Equipment Utilized During Treatment Gait belt   Activity Tolerance Patient tolerated treatment well;Patient limited by fatigue   Behavior [facility] for tasks assessed/performed      Past Medical History:  Diagnosis Date  . Acute renal failure (HCC) 04/17/2013  . Anemia, iron deficiency 09/09/2013  . Anxiety   . Arthritis   . 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 Coronary artery disease    IN 2000   STENT PLACED IN 2012 sees Dr. Dietrich Patesothbart, saw last 2013  . Disorder of blood    BEEN TREATED BY DERMATOLOGIST X 4 YRS..."BLOOD BLISTERS"  . Gout   . HTN (hypertension)    [place] Dr. [place]etta [place]Hawkins in [place]  . Hx MRSA infection    rt shoulder  . Hyperlipidemia   . Pneumonia    "I've had it 3-4 times"  . Small bowel obstruction   . Small bowel problem    HAD ALOT OF SCAR TISSUE FROM PREVIOUS SURGERIES..NG WAS INSERTED ...Marland Kitchen.Marland Kitchen.NO SURGERY NEEDED...Marland Kitchen.Marland Kitchen.IN FOR 8 DAYS  . Ventral hernia     Past Surgical History:  Procedure Laterality Date  . ANTERIOR CERVICAL CORPECTOMY  12/17/11  . ANTERIOR CERVICAL CORPECTOMY  12/17/2011   Procedure: ANTERIOR CERVICAL CORPECTOMY;  Surgeon: Karn CassisErnesto M Botero, MD;  Location: [facility];  Service: Neurosurgery;  Laterality: N/A;   Anterior Cervical Decompression Fusion Five to Thoracic Two with plating  . BACK SURGERY     lumbar  . CATARACT EXTRACTION W/ INTRAOCULAR LENS  IMPLANT, BILATERAL  ? 2011  . CHOLECYSTECTOMY  2006 "or after"  . CORONARY ANGIOPLASTY WITH STENT PLACEMENT  2012  . CORONARY ARTERY BYPASS GRAFT  2000   CABG X5  . EYE SURGERY     bilateral cataract  . INCISION AND DRAINAGE OF WOUND  ~ 20ll; 12/03/11   "had infection in my right"  . LUMBAR LAMINECTOMY/DECOMPRESSION MICRODISCECTOMY  06/13/2011   Procedure: LUMBAR LAMINECTOMY/DECOMPRESSION MICRODISCECTOMY;  Surgeon: Karn CassisErnesto M Botero;  Location: [facility];  Service: Neurosurgery;  Laterality: N/A;  Lumbar three, lumbar four-five Laminectomy  . LUMBAR LAMINECTOMY/DECOMPRESSION MICRODISCECTOMY Right 06/17/2013   Procedure: LUMBAR LAMINECTOMY/DECOMPRESSION MICRODISCECTOMY 1 LEVEL;  Surgeon: Karn CassisErnesto M Botero, MD;  [facility]: [facility];  Service: Neurosurgery;  Laterality: Right;  Right L3-4 Microdiskectomy  . LUMBAR WOUND DEBRIDEMENT N/A 08/02/2013   Procedure: INCISION AND DRAINAGE OF LUMBAR WOUND DEBRIDEMENT;  Surgeon: Karn CassisErnesto M Botero, MD;  [facility]: [facility];  Service: Neurosurgery;  Laterality: N/A;  . PERIPHERALLY INSERTED CENTRAL CATHETER INSERTION  2011 & 11/2011  . SHOULDER ARTHROSCOPY Left 09/13/2013   Procedure: ARTHROSCOPIC IRRIGATION AND DEBRIDEMENT, SYNOVECTOMY ,  LEFT SHOULDER ;  Surgeon: Dyke BrackettW D Caffrey  Montez HagemanJr., MD;  Location: [facility];  Service: Orthopedics;  Laterality: Left;  . SHOULDER OPEN ROTATOR CUFF REPAIR  ~ 2011   right  . STERNAL SURG  2000   HAS HAD 5-6 ON HIS STERNUM; "caught MRSA in it"  . TONSILLECTOMY  1949    There were no vitals filed for this visit.      Subjective Assessment - 06/12/16 0953    Subjective Pt stated he feels like he is getting his strength back, has returned to [employer] gym 3x/wk   Pertinent History beta blockers, CHF/CAD, HTN, history of lumbar laminectomy, CABG, history of ventral hernia, history  of AVN B hips, history of cervical corpectomy, brace secondary to no sternum   Patient Stated Goals get leg working better, get walking    Currently in Pain? No/denies                         [facility] Treatment/Exercise - 06/12/16 0001      Lumbar Exercises: Stretches   Prone on Elbows Stretch 3 reps;30 seconds   Quad Stretch 3 reps;30 seconds     Lumbar Exercises: Seated   Sit to Stand 10 reps   Sit to Stand Limitations U UEs, brace, min guard; twisting of trunk noted due to fatigue      Lumbar Exercises: Supine   Bridge 20 reps;5 seconds  feet on dynadisk 5" holds 2 sets   Bridge Limitations 5 second hold with G TB on the 2nd set. feet on dynadisk     Lumbar Exercises: Sidelying   Hip Abduction 10 reps   Hip Abduction Weights (lbs) AA on Lt ; AROM on Rt   Hip Abduction Limitations 15 Lt hip abduction isometric into the mat      Lumbar Exercises: Quadruped   Straight Leg Raise 10 reps;3 seconds  quadruped position AA with Lt LE   Other Quadruped Lumbar Exercises crawling x 5   Other Quadruped Lumbar Exercises tall kneeling balance activity 2 sets 1 min each with intermittent HHA; able to tall kneel for 10" with no HHA prior fatigue                  PT Short Term Goals - 06/09/16 0938      PT SHORT TERM GOAL #1   Title Patient to be able to ambulate at least 47550ft with rollator and KAFO in order to demonstrate improvement in mobilty    Baseline 7/11- patient reports he is not doing them every day but has started working out again    Time 3   Period Weeks   Status On-going     PT SHORT TERM GOAL #2   Title Patient to be able to tolerate CKC exercise with KAFO unlocked and minimal unsteadiness/instability in order to demonstrate improvement in condition    Baseline 7/11- requires UE support to stand    Time 3   Period Weeks   Status Achieved     PT SHORT TERM GOAL #3   Title Patient to be able to statically stand with KAFO locked and no  UE support in order to demonstrate improved balance    Time 3   Period Weeks   Status On-going     PT SHORT TERM GOAL #4   Title Patient will  consistetly perform appropriate HEP correctly, to be updated PRN    Baseline 7/11- patient states pain still getting around 5/10   Time 3  Period Weeks   Status Achieved           PT Long Term Goals - 06/09/16 4098      PT LONG TERM GOAL #1   Title Patient will demonstrate L LE strength at least 4-/5 and 5/5 muslce strength in all other tested groups in order to assist in improving gait and stablity    Time 6   Period Weeks   Status On-going     PT LONG TERM GOAL #2   Title Patient to be able to ambulate and perform functional mobility with AFO and rollator rather than KAFO in order to demonstrate improved strength and mobiltiy    Time 6   Period Weeks   Status On-going     PT LONG TERM GOAL #3   Title Patient to be able to complete TUG balance test in 18 seconds or less wtih rollator in order to demonstrate improved mobility and reduced fall risk    Baseline 8/10: 26 seconds ( significant time to unlock and lock brace   Time 6   Period Weeks   Status On-going     PT LONG TERM GOAL #4   Title Patient to be able to ambulate at least 644ft with  rollator during in order to demonstrate improved overall mobilty    Time 6   Period Weeks   Status On-going               Plan - 06/12/16 1534    Clinical Impression Statement Pt reports return to gym and improved appetite since sickness 2-3 weeks.  Continued session foucs on improving proximal muscualture and functional strengthening.  Resumed quadruped position and tall kneeling activities with therapist facilitation for safety and assistnace with Lt LE due to weakness.  Pt able to tall kneel wiht no HHA for 10" prior fatigue.  no reports of pain through session     Rehab Potential Good   PT Frequency 2x / week   PT Duration 2 weeks  16 weeks   PT Treatment/Interventions  Gait training;Functional mobility training;Therapeutic activities;Therapeutic exercise;Balance training;Patient/family education;Manual techniques   PT Next Visit Plan continue quadruped and tall kneeling activities      Patient will benefit from skilled therapeutic intervention in order to improve the following deficits and impairments:  Abnormal gait, Decreased activity tolerance, Decreased strength, Decreased balance, Decreased mobility, Difficulty walking, Improper body mechanics, Decreased coordination, Impaired flexibility, Postural dysfunction  Visit Diagnosis: Difficulty in walking, not elsewhere classified  Muscle weakness (generalized)  Unsteadiness on feet  G8978 CL G8979 CK Problem List Patient Active Problem List   Diagnosis Date Noted  . 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 Infection of urinary tract 10/26/2015  . Intractable back pain 10/22/2015  . Back pain 10/22/2015  . Muscle weakness (generalized) 12/05/2013  . Tight fascia 12/05/2013  . Decreased range of motion of right shoulder 12/05/2013  . Decreased range of motion of left shoulder 12/05/2013  . Bilateral leg weakness 11/21/2013  . Poor balance 11/21/2013  . Difficulty in walking(719.7) 11/21/2013  . Shoulder pain, left 09/12/2013  . Anemia, iron deficiency 09/09/2013  . Dyspnea 09/07/2013  . UTI (urinary tract infection) 09/07/2013  . Acute CHF (congestive heart failure) (HCC) 09/07/2013  . Elevated troponin 09/07/2013  . Fever and chills 09/06/2013  . Altered mental status 09/05/2013  . Neurogenic bowel 08/22/2013  . 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 Gout flare 08/22/2013  . Postoperative wound infection  08/22/2013  . Lumbar degenerative disc disease 08/02/2013  . Post-operative pain 07/29/2013  . Osteonecrosis (HCC) 05/05/2013  . Small bowel obstruction 04/17/2013  . Acute renal failure (HCC) 04/17/2013  . Medial meniscus, posterior horn  derangement 02/17/2013  . Knee sprain and strain 02/17/2013  . Trigger point with neck pain 03/18/2012  . Rotator cuff tear arthropathy of right shoulder 09/08/2011  . CAD in native artery 01/09/2011  . Ankle fracture 12/25/2010  . Contusion of shoulder, left 12/25/2010  . PEMPHIGUS VULGARIS 07/02/2010  . MRSA 05/13/2010  . PRURITUS 05/13/2010  . SPINAL STENOSIS, LUMBAR 05/13/2010  . HLD (hyperlipidemia) 04/24/2010  . GASTROESOPHAGEAL REFLUX DISEASE 04/24/2010  . VENTRAL HERNIA, INCISIONAL 04/24/2010  . DEGENERATIVE JOINT DISEASE 04/24/2010  . PYOGENIC ARTHRITIS, SHOULDER REGION 02/04/2010  . Essential hypertension 01/01/2010  . RUPTURE ROTATOR CUFF 02/19/2009   Becky Sax, LPTA; CBIS [phone]  Juel Burrow 06/12/2016, 5:20 PM Virgina Organ, PT CLT [phone] [address] [facility] [address] [place], [place], [address] Phone: [phone]   Fax:  [phone]  Name: KMARION RAWL MRN: [id number] Date of Birth: May 13, 1941

## 2016-06-16 ENCOUNTER — Ambulatory Visit (HOSPITAL_COMMUNITY): Payer: Medicare Other | Admitting: Physical Therapy

## 2016-06-16 DIAGNOSIS — R2681 Unsteadiness on feet: Secondary | ICD-10-CM

## 2016-06-16 DIAGNOSIS — M6281 Muscle weakness (generalized): Secondary | ICD-10-CM | POA: Diagnosis not present

## 2016-06-16 DIAGNOSIS — R29898 Other symptoms and signs involving the musculoskeletal system: Secondary | ICD-10-CM | POA: Diagnosis not present

## 2016-06-16 DIAGNOSIS — R262 Difficulty in walking, not elsewhere classified: Secondary | ICD-10-CM | POA: Diagnosis not present

## 2016-06-16 NOTE — Therapy (Addendum)
[facility] [address] [place], [place], [address] Phone: [phone]   Fax:  [phone]  Physical Therapy Treatment  Patient Details  Name: [name] MRN: [id number] Date of Birth: 04/02/1941 Referring Provider: Guy SandiferEdward Hawkings   Encounter Date: 06/16/2016      PT End of Session - 06/16/16 0939    Visit Number 27   Number of Visits 32   Date for PT Re-Evaluation 06/18/16   Authorization Type Medicare/[employer]    Authorization Time Period 06/09/2016-07/09/2016   g code done on visit 26   Authorization - Visit Number 27   Authorization - Number of Visits 32   PT Start Time 0903   PT Stop Time 0925   PT Time Calculation (min) 22 min   Equipment Utilized During Treatment Gait belt   Activity Tolerance Patient tolerated treatment well;Patient limited by fatigue   Behavior During [facility] for tasks assessed/performed      Past Medical History:  Diagnosis Date  . Acute renal failure (HCC) 04/17/2013  . Anemia, iron deficiency 09/09/2013  . Anxiety   . Arthritis   . 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 Coronary artery disease    IN 2000   STENT PLACED IN 2012 sees Dr. Dietrich Patesothbart, saw last 2013  . Disorder of blood    BEEN TREATED BY DERMATOLOGIST X 4 YRS..."BLOOD BLISTERS"  . Gout   . HTN (hypertension)    [place]  . Hx MRSA infection    rt shoulder  . Hyperlipidemia   . Pneumonia    "I've had it 3-4 times"  . Small bowel obstruction   . Small bowel problem    HAD ALOT OF SCAR TISSUE FROM PREVIOUS SURGERIES..NG WAS INSERTED ...Marland Kitchen.Marland Kitchen.NO SURGERY NEEDED...Marland Kitchen.Marland Kitchen.IN FOR 8 DAYS  . Ventral hernia     Past Surgical History:  Procedure Laterality Date  . ANTERIOR CERVICAL CORPECTOMY  12/17/11  . ANTERIOR CERVICAL CORPECTOMY  12/17/2011   Procedure: ANTERIOR CERVICAL CORPECTOMY;  Surgeon: Karn CassisErnesto M Botero, MD;  Location: [facility];  Service: Neurosurgery;  Laterality: N/A;   Anterior Cervical Decompression Fusion Five to Thoracic Two with plating  . BACK SURGERY     lumbar  . CATARACT EXTRACTION W/ INTRAOCULAR LENS  IMPLANT, BILATERAL  ? 2011  . CHOLECYSTECTOMY  2006 "or after"  . CORONARY ANGIOPLASTY WITH STENT PLACEMENT  2012  . CORONARY ARTERY BYPASS GRAFT  2000   CABG X5  . EYE SURGERY     bilateral cataract  . INCISION AND DRAINAGE OF WOUND  ~ 20ll; 12/03/11   "had infection in my right"  . LUMBAR LAMINECTOMY/DECOMPRESSION MICRODISCECTOMY  06/13/2011   Procedure: LUMBAR LAMINECTOMY/DECOMPRESSION MICRODISCECTOMY;  Surgeon: Karn CassisErnesto M Botero;  Location: [facility];  Service: Neurosurgery;  Laterality: N/A;  Lumbar three, lumbar four-five Laminectomy  . LUMBAR LAMINECTOMY/DECOMPRESSION MICRODISCECTOMY Right 06/17/2013   Procedure: LUMBAR LAMINECTOMY/DECOMPRESSION MICRODISCECTOMY 1 LEVEL;  Surgeon: Karn CassisErnesto M Botero, MD;  Location: [facility];  Service: Neurosurgery;  Laterality: Right;  Right L3-4 Microdiskectomy  . LUMBAR WOUND DEBRIDEMENT N/A 08/02/2013   Procedure: INCISION AND DRAINAGE OF LUMBAR WOUND DEBRIDEMENT;  Surgeon: Karn CassisErnesto M Botero, MD;  [facility]: [facility];  Service: Neurosurgery;  Laterality: N/A;  . PERIPHERALLY INSERTED CENTRAL CATHETER INSERTION  2011 & 11/2011  . SHOULDER ARTHROSCOPY Left 09/13/2013   Procedure: ARTHROSCOPIC IRRIGATION AND DEBRIDEMENT, SYNOVECTOMY ,  LEFT SHOULDER ;  Surgeon: Dyke BrackettW D Caffrey  Montez HagemanJr., MD;  Location: [facility];  Service: Orthopedics;  Laterality: Left;  . SHOULDER OPEN ROTATOR CUFF REPAIR  ~ 2011   right  . STERNAL SURG  2000   HAS HAD 5-6 ON HIS STERNUM; "caught MRSA in it"  . TONSILLECTOMY  1949    There were no vitals filed for this visit.      Subjective Assessment - 06/16/16 0906    Subjective Pt states he is still having stomach problems.  Not eating well yet so he has not energy    Pertinent History beta blockers, CHF/CAD, HTN, history of lumbar laminectomy, CABG, history of ventral hernia, history of AVN B  hips, history of cervical corpectomy, brace secondary to no sternum   Patient Stated Goals get leg working better, get walking    Pain Score 4    Pain Location Back   Pain Orientation Left   Pain Descriptors / Indicators Aching   Pain Type Chronic pain   Pain Onset More than a month ago   Pain Frequency Constant   Aggravating Factors  weight bearing    Pain Relieving Factors sitting                          [facility] Adult PT Treatment/Exercise - 06/16/16 0001      Lumbar Exercises: Seated   Sit to Stand 10 reps     Lumbar Exercises: Sidelying   Hip Abduction 15 reps   Hip Abduction Weights (lbs) isometricLt; active RT    Other Sidelying Lumbar Exercises Lt knee flexion with 5# x 15;    Other Sidelying Lumbar Exercises Lt hip extension x 15      Lumbar Exercises: Prone   Other Prone Lumbar Exercises heel squeeze x 10                   PT Short Term Goals - 06/09/16 0938      PT SHORT TERM GOAL #1   Title Patient to be able to ambulate at least 48450ft with rollator and KAFO in order to demonstrate improvement in mobilty    Baseline 7/11- patient reports he is not doing them every day but has started working out again    Time 3   Period Weeks   Status On-going     PT SHORT TERM GOAL #2   Title Patient to be able to tolerate CKC exercise with KAFO unlocked and minimal unsteadiness/instability in order to demonstrate improvement in condition    Baseline 7/11- requires UE support to stand    Time 3   Period Weeks   Status Achieved     PT SHORT TERM GOAL #3   Title Patient to be able to statically stand with KAFO locked and no UE support in order to demonstrate improved balance    Time 3   Period Weeks   Status On-going     PT SHORT TERM GOAL #4   Title Patient will  consistetly perform appropriate HEP correctly, to be updated PRN    Baseline 7/11- patient states pain still getting around 5/10   Time 3   Period Weeks   Status Achieved            PT Long Term Goals - 06/09/16 [id number]      PT LONG TERM GOAL #1   Title Patient will demonstrate L LE strength at least 4-/5 and 5/5 muslce strength in all other tested groups in order to assist in  improving gait and stablity    Time 6   Period Weeks   Status On-going     PT LONG TERM GOAL #2   Title Patient to be able to ambulate and perform functional mobility with AFO and rollator rather than KAFO in order to demonstrate improved strength and mobiltiy    Time 6   Period Weeks   Status On-going     PT LONG TERM GOAL #3   Title Patient to be able to complete TUG balance test in 18 seconds or less wtih rollator in order to demonstrate improved mobility and reduced fall risk    Baseline 8/10: 26 seconds ( significant time to unlock and lock brace   Time 6   Period Weeks   Status On-going     PT LONG TERM GOAL #4   Title Patient to be able to ambulate at least 616ft with  rollator during in order to demonstrate improved overall mobilty    Time 6   Period Weeks   Status On-going               Plan - 06/16/16 0941    Clinical Impression Statement PT states that his stomach is still not right.  Pt stopped session early stating that he needed to go home secondary to stomach issues.      Rehab Potential Good   PT Frequency 2x / week   PT Duration 2 weeks  16 weeks   PT Treatment/Interventions Gait training;Functional mobility training;Therapeutic activities;Therapeutic exercise;Balance training;Patient/family education;Manual techniques   PT Next Visit Plan advance pt as able.  Speak to pt about possibly going on hold until his stomach issues are rectified.        Patient will benefit from skilled therapeutic intervention in order to improve the following deficits and impairments:  Abnormal gait, Decreased activity tolerance, Decreased strength, Decreased balance, Decreased mobility, Difficulty walking, Improper body mechanics, Decreased coordination, Impaired  flexibility, Postural dysfunction  Visit Diagnosis: Difficulty in walking, not elsewhere classified  Muscle weakness (generalized)  Unsteadiness on feet  Bilateral leg weakness     Problem List Patient Active Problem List   Diagnosis Date Noted  . 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 Infection of urinary tract 10/26/2015  . Intractable back pain 10/22/2015  . Back pain 10/22/2015  . Muscle weakness (generalized) 12/05/2013  . Tight fascia 12/05/2013  . Decreased range of motion of right shoulder 12/05/2013  . Decreased range of motion of left shoulder 12/05/2013  . Bilateral leg weakness 11/21/2013  . Poor balance 11/21/2013  . Difficulty in walking(719.7) 11/21/2013  . Shoulder pain, left 09/12/2013  . Anemia, iron deficiency 09/09/2013  . Dyspnea 09/07/2013  . UTI (urinary tract infection) 09/07/2013  . Acute CHF (congestive heart failure) (HCC) 09/07/2013  . Elevated troponin 09/07/2013  . Fever and chills 09/06/2013  . Altered mental status 09/05/2013  . Neurogenic bowel 08/22/2013  . 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 Gout flare 08/22/2013  . Postoperative wound infection 08/22/2013  . Lumbar degenerative disc disease 08/02/2013  . Post-operative pain 07/29/2013  . Osteonecrosis (HCC) 05/05/2013  . Small bowel obstruction 04/17/2013  . Acute renal failure (HCC) 04/17/2013  . Medial meniscus, posterior horn derangement 02/17/2013  . Knee sprain and strain 02/17/2013  . Trigger point with neck pain 03/18/2012  . Rotator cuff tear arthropathy of right shoulder 09/08/2011  . CAD in native artery 01/09/2011  . Ankle fracture 12/25/2010  . Contusion  of shoulder, left 12/25/2010  . PEMPHIGUS VULGARIS 07/02/2010  . MRSA 05/13/2010  . PRURITUS 05/13/2010  . SPINAL STENOSIS, LUMBAR 05/13/2010  . HLD (hyperlipidemia) 04/24/2010  . GASTROESOPHAGEAL REFLUX DISEASE 04/24/2010  . VENTRAL HERNIA, INCISIONAL 04/24/2010  .  DEGENERATIVE JOINT DISEASE 04/24/2010  . PYOGENIC ARTHRITIS, SHOULDER REGION 02/04/2010  . Essential hypertension 01/01/2010  . RUPTURE ROTATOR CUFF 02/19/2009   Virgina Organ, PT CLT [phone] 06/16/2016, 9:43 AM  [place] [address] [place], [place], [address] Phone: [phone]   Fax:  [phone]  Name: [name] MRN: [id number] Date of Birth: May 17, 1941

## 2016-06-18 ENCOUNTER — Ambulatory Visit (HOSPITAL_COMMUNITY): Payer: Medicare Other | Admitting: Physical Therapy

## 2016-06-25 ENCOUNTER — Ambulatory Visit (HOSPITAL_COMMUNITY): Payer: Medicare Other | Admitting: Physical Therapy

## 2016-06-25 DIAGNOSIS — M6281 Muscle weakness (generalized): Secondary | ICD-10-CM

## 2016-06-25 DIAGNOSIS — R29898 Other symptoms and signs involving the musculoskeletal system: Secondary | ICD-10-CM

## 2016-06-25 DIAGNOSIS — R262 Difficulty in walking, not elsewhere classified: Secondary | ICD-10-CM

## 2016-06-25 DIAGNOSIS — R2681 Unsteadiness on feet: Secondary | ICD-10-CM | POA: Diagnosis not present

## 2016-06-25 NOTE — Therapy (Signed)
[facility] [address] [place], [place], [address] Phone: [phone]   Fax:  [phone]  Physical Therapy Treatment  Patient Details  Name: [name] MRN: [id number] Date of Birth: 02/10/1941 Referring Provider: Guy SandiferEdward Hawkings   Encounter Date: 06/25/2016      PT End of Session - 06/25/16 1030    Visit Number 28   Number of Visits 32   Date for PT Re-Evaluation 06/18/16   Authorization Type Medicare/[employer]    Authorization Time Period 06/09/2016-07/09/2016   g code done on visit 26   Authorization - Visit Number 28   Authorization - Number of Visits 32   PT Start Time 0902   PT Stop Time 0948   PT Time Calculation (min) 46 min   Equipment Utilized During Treatment Gait belt   Activity Tolerance Patient tolerated treatment well;Patient limited by fatigue   Behavior During Therapy [facility] for tasks assessed/performed      Past Medical History:  Diagnosis Date  . Acute renal failure (HCC) 04/17/2013  . Anemia, iron deficiency 09/09/2013  . Anxiety   . Arthritis   . 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 Coronary artery disease    IN 2000   STENT PLACED IN 2012 sees Dr. Dietrich Patesothbart, saw last 2013  . Disorder of blood    BEEN TREATED BY DERMATOLOGIST X 4 YRS..."BLOOD BLISTERS"  . Gout   . HTN (hypertension)    [place]. [place]  . Hx MRSA infection    rt shoulder  . Hyperlipidemia   . Pneumonia    "I've had it 3-4 times"  . Small bowel obstruction   . Small bowel problem    HAD ALOT OF SCAR TISSUE FROM PREVIOUS SURGERIES..NG WAS INSERTED ...Marland Kitchen.Marland Kitchen.NO SURGERY NEEDED...Marland Kitchen.Marland Kitchen.IN FOR 8 DAYS  . Ventral hernia     Past Surgical History:  Procedure Laterality Date  . ANTERIOR CERVICAL CORPECTOMY  12/17/11  . ANTERIOR CERVICAL CORPECTOMY  12/17/2011   Procedure: ANTERIOR CERVICAL CORPECTOMY;  Surgeon: Karn CassisErnesto M Botero, MD;  [facility]: [facility];  Service: Neurosurgery;  Laterality: N/A;   Anterior Cervical Decompression Fusion Five to Thoracic Two with plating  . BACK SURGERY     lumbar  . CATARACT EXTRACTION W/ INTRAOCULAR LENS  IMPLANT, BILATERAL  ? 2011  . CHOLECYSTECTOMY  2006 "or after"  . CORONARY ANGIOPLASTY WITH STENT PLACEMENT  2012  . CORONARY ARTERY BYPASS GRAFT  2000   CABG X5  . EYE SURGERY     bilateral cataract  . INCISION AND DRAINAGE OF WOUND  ~ 20ll; 12/03/11   "had infection in my right"  . LUMBAR LAMINECTOMY/DECOMPRESSION MICRODISCECTOMY  06/13/2011   Procedure: LUMBAR LAMINECTOMY/DECOMPRESSION MICRODISCECTOMY;  Surgeon: Karn CassisErnesto M Botero;  [facility]: [facility];  Service: Neurosurgery;  Laterality: N/A;  Lumbar three, lumbar four-five Laminectomy  . LUMBAR LAMINECTOMY/DECOMPRESSION MICRODISCECTOMY Right 06/17/2013   Procedure: LUMBAR LAMINECTOMY/DECOMPRESSION MICRODISCECTOMY 1 LEVEL;  Surgeon: Karn CassisErnesto M Botero, MD;  [facility]: [facility];  Service: Neurosurgery;  Laterality: Right;  Right L3-4 Microdiskectomy  . LUMBAR WOUND DEBRIDEMENT N/A 08/02/2013   Procedure: INCISION AND DRAINAGE OF LUMBAR WOUND DEBRIDEMENT;  Surgeon: Karn CassisErnesto M Botero, MD;  [facility]: [facility];  Service: Neurosurgery;  Laterality: N/A;  . PERIPHERALLY INSERTED CENTRAL CATHETER INSERTION  2011 & 11/2011  . SHOULDER ARTHROSCOPY Left 09/13/2013   Procedure: ARTHROSCOPIC IRRIGATION AND DEBRIDEMENT, SYNOVECTOMY ,  LEFT SHOULDER ;  Surgeon: Dyke BrackettW D Caffrey  Montez HagemanJr., MD;  Location: [facility];  Service: Orthopedics;  Laterality: Left;  . SHOULDER OPEN ROTATOR CUFF REPAIR  ~ 2011   right  . STERNAL SURG  2000   HAS HAD 5-6 ON HIS STERNUM; "caught MRSA in it"  . TONSILLECTOMY  1949    There were no vitals filed for this visit.      Subjective Assessment - 06/25/16 0909    Subjective Pt states he is doing a little better today.  Energy is still low but working on it.   Currently in Pain? No/denies                         [facility] Adult PT Treatment/Exercise - 06/25/16 0001       Lumbar Exercises: Seated   Sit to Stand 10 reps  with 1 UE on walker     Lumbar Exercises: Supine   Bridge 20 reps;5 seconds   Bridge Limitations 5 second hold with G TB on the 2nd set. feet on dynadisk     Lumbar Exercises: Sidelying   Clam 15 reps   Clam Limitations Lt only with holds   Hip Abduction 15 reps   Hip Abduction Weights (lbs) isometricLt; active RT      Lumbar Exercises: Prone   Other Prone Lumbar Exercises heel squeeze x 10    Other Prone Lumbar Exercises hamstring curls AAROM bilaterally 10 reps, PROM quad stretch 3X30"                  PT Short Term Goals - 06/09/16 0938      PT SHORT TERM GOAL #1   Title Patient to be able to ambulate at least 448[address]ft with rollator and KAFO in order to demonstrate improvement in mobilty    Baseline 7/11- patient reports he is not doing them every day but has started working out again    Time 3   Period Weeks   Status On-going     PT SHORT TERM GOAL #2   Title Patient to be able to tolerate CKC exercise with KAFO unlocked and minimal unsteadiness/instability in order to demonstrate improvement in condition    Baseline 7/11- requires UE support to stand    Time 3   Period Weeks   Status Achieved     PT SHORT TERM GOAL #3   Title Patient to be able to statically stand with KAFO locked and no UE support in order to demonstrate improved balance    Time 3   Period Weeks   Status On-going     PT SHORT TERM GOAL #4   Title Patient will  consistetly perform appropriate HEP correctly, to be updated PRN    Baseline 7/11- patient states pain still getting around 5/10   Time 3   Period Weeks   Status Achieved           PT Long Term Goals - 06/09/16 [id number]      PT LONG TERM GOAL #1   Title Patient will demonstrate L LE strength at least 4-/5 and 5/5 muslce strength in all other tested groups in order to assist in improving gait and stablity    Time 6   Period Weeks   Status On-going     PT LONG TERM GOAL #2    Title Patient to be able to ambulate and perform functional mobility with AFO and rollator rather than KAFO in order to demonstrate improved strength and mobiltiy  Time 6   Period Weeks   Status On-going     PT LONG TERM GOAL #3   Title Patient to be able to complete TUG balance test in 18 seconds or less wtih rollator in order to demonstrate improved mobility and reduced fall risk    Baseline 8/10: 26 seconds ( significant time to unlock and lock brace   Time 6   Period Weeks   Status On-going     PT LONG TERM GOAL #4   Title Patient to be able to ambulate at least 654ft with  rollator during in order to demonstrate improved overall mobilty    Time 6   Period Weeks   Status On-going               Plan - 06/25/16 1031    Clinical Impression Statement Pt able to complete full session today without complaints.  Added prone AAROM hamstring curls.  Pt also requires AAROM with prone heelsqueeze and sidelying exercises with Lt LE for correct form and reduced substitution.  Resumed clams for Lt LE as well for further sterngthening of weak hip abductor.  Pt continues to go to [employer] in the morning to additionally work on weak musculature.     Rehab Potential Good   PT Frequency 2x / week   PT Duration 2 weeks  16 weeks   PT Treatment/Interventions Gait training;Functional mobility training;Therapeutic activities;Therapeutic exercise;Balance training;Patient/family education;Manual techniques   PT Next Visit Plan Continue to progress towards goals with LE strengthening as primary focus.       Patient will benefit from skilled therapeutic intervention in order to improve the following deficits and impairments:  Abnormal gait, Decreased activity tolerance, Decreased strength, Decreased balance, Decreased mobility, Difficulty walking, Improper body mechanics, Decreased coordination, Impaired flexibility, Postural dysfunction  Visit Diagnosis: Difficulty in walking, not  elsewhere classified  Muscle weakness (generalized)  Unsteadiness on feet  Bilateral leg weakness     Problem List Patient Active Problem List   Diagnosis Date Noted  . 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 Infection of urinary tract 10/26/2015  . Intractable back pain 10/22/2015  . Back pain 10/22/2015  . Muscle weakness (generalized) 12/05/2013  . Tight fascia 12/05/2013  . Decreased range of motion of right shoulder 12/05/2013  . Decreased range of motion of left shoulder 12/05/2013  . Bilateral leg weakness 11/21/2013  . Poor balance 11/21/2013  . Difficulty in walking(719.7) 11/21/2013  . Shoulder pain, left 09/12/2013  . Anemia, iron deficiency 09/09/2013  . Dyspnea 09/07/2013  . UTI (urinary tract infection) 09/07/2013  . Acute CHF (congestive heart failure) (HCC) 09/07/2013  . Elevated troponin 09/07/2013  . Fever and chills 09/06/2013  . Altered mental status 09/05/2013  . Neurogenic bowel 08/22/2013  . 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 Gout flare 08/22/2013  . Postoperative wound infection 08/22/2013  . Lumbar degenerative disc disease 08/02/2013  . Post-operative pain 07/29/2013  . Osteonecrosis (HCC) 05/05/2013  . Small bowel obstruction 04/17/2013  . Acute renal failure (HCC) 04/17/2013  . Medial meniscus, posterior horn derangement 02/17/2013  . Knee sprain and strain 02/17/2013  . Trigger point with neck pain 03/18/2012  . Rotator cuff tear arthropathy of right shoulder 09/08/2011  . CAD in native artery 01/09/2011  . Ankle fracture 12/25/2010  . Contusion of shoulder, left 12/25/2010  . PEMPHIGUS VULGARIS 07/02/2010  . MRSA 05/13/2010  . PRURITUS 05/13/2010  . SPINAL STENOSIS, LUMBAR 05/13/2010  .  HLD (hyperlipidemia) 04/24/2010  . GASTROESOPHAGEAL REFLUX DISEASE 04/24/2010  . VENTRAL HERNIA, INCISIONAL 04/24/2010  . DEGENERATIVE JOINT DISEASE 04/24/2010  . PYOGENIC ARTHRITIS, SHOULDER REGION  02/04/2010  . Essential hypertension 01/01/2010  . RUPTURE ROTATOR CUFF 02/19/2009    [name], PTA/CLT [phone]  06/25/2016, 10:34 AM  [facility] [address] [place], [place], [address] Phone: [phone]   Fax:  [phone]  Name: [name] MRN: [id number] Date of Birth: May 07, 1941

## 2016-06-27 ENCOUNTER — Ambulatory Visit (HOSPITAL_COMMUNITY): Payer: Medicare Other | Admitting: Physical Therapy

## 2016-06-27 ENCOUNTER — Telehealth (HOSPITAL_COMMUNITY): Payer: Self-pay | Admitting: Pulmonary Disease

## 2016-06-27 NOTE — Telephone Encounter (Signed)
06/27/16  Pt left a message to cx today because he is sick

## 2016-07-01 ENCOUNTER — Ambulatory Visit (HOSPITAL_COMMUNITY): Payer: Medicare Other | Attending: Pulmonary Disease | Admitting: Physical Therapy

## 2016-07-01 DIAGNOSIS — R262 Difficulty in walking, not elsewhere classified: Secondary | ICD-10-CM | POA: Insufficient documentation

## 2016-07-01 DIAGNOSIS — M6281 Muscle weakness (generalized): Secondary | ICD-10-CM | POA: Insufficient documentation

## 2016-07-01 DIAGNOSIS — R2681 Unsteadiness on feet: Secondary | ICD-10-CM | POA: Diagnosis not present

## 2016-07-01 NOTE — Therapy (Signed)
[facility] [address] [place], [place], [address] Phone: [phone]   Fax:  [phone]  Physical Therapy Treatment  Patient Details  Name: [name] MRN: [id number] Date of Birth: 08-Aug-1940 Referring Provider: Guy Sandifer   Encounter Date: 07/01/2016      PT End of Session - 07/01/16 1020    Visit Number 29   Number of Visits 32   Date for PT Re-Evaluation 06/18/16   Authorization Type Medicare/[employer]    Authorization Time Period 06/09/2016-07/09/2016   g code done on visit 26   Authorization - Visit Number 29   Authorization - Number of Visits 32   PT Start Time [phone]   PT Stop Time 1030   PT Time Calculation (min) 38 min   Equipment Utilized During Treatment Gait belt   Activity Tolerance Patient tolerated treatment well;Patient limited by fatigue   Behavior During Therapy WFL for tasks assessed/performed      Past Medical History:  Diagnosis Date  . Acute renal failure (HCC) 04/17/2013  . Anemia, iron deficiency 09/09/2013  . Anxiety   . Arthritis   . 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 Coronary artery disease    IN 2000   STENT PLACED IN 2012 sees Dr. Dietrich Pates, saw last 2013  . Disorder of blood    BEEN TREATED BY DERMATOLOGIST X 4 YRS..."BLOOD BLISTERS"  . Gout   . HTN (hypertension)    sees [name] in [place]  . Hx MRSA infection    rt shoulder  . Hyperlipidemia   . Pneumonia    "I've had it 3-4 times"  . Small bowel obstruction   . Small bowel problem    HAD ALOT OF SCAR TISSUE FROM PREVIOUS SURGERIES..NG WAS INSERTED ...Marland KitchenMarland KitchenNO SURGERY NEEDED...Marland KitchenMarland KitchenIN FOR 8 DAYS  . Ventral hernia     Past Surgical History:  Procedure Laterality Date  . ANTERIOR CERVICAL CORPECTOMY  12/17/11  . ANTERIOR CERVICAL CORPECTOMY  12/17/2011   Procedure: ANTERIOR CERVICAL CORPECTOMY;  Surgeon: Karn Cassis, MD;  Location: [facility];  Service: Neurosurgery;  Laterality: N/A;   Anterior Cervical Decompression Fusion Five to Thoracic Two with plating  . BACK SURGERY     lumbar  . CATARACT EXTRACTION W/ INTRAOCULAR LENS  IMPLANT, BILATERAL  ? 2011  . CHOLECYSTECTOMY  2006 "or after"  . CORONARY ANGIOPLASTY WITH STENT PLACEMENT  2012  . CORONARY ARTERY BYPASS GRAFT  2000   CABG X5  . EYE SURGERY     bilateral cataract  . INCISION AND DRAINAGE OF WOUND  ~ 20ll; 12/03/11   "had infection in my right"  . LUMBAR LAMINECTOMY/DECOMPRESSION MICRODISCECTOMY  06/13/2011   Procedure: LUMBAR LAMINECTOMY/DECOMPRESSION MICRODISCECTOMY;  Surgeon: Karn Cassis;  Location: [facility];  Service: Neurosurgery;  Laterality: N/A;  Lumbar three, lumbar four-five Laminectomy  . LUMBAR LAMINECTOMY/DECOMPRESSION MICRODISCECTOMY Right 06/17/2013   Procedure: LUMBAR LAMINECTOMY/DECOMPRESSION MICRODISCECTOMY 1 LEVEL;  Surgeon: Karn Cassis, MD;  Location: [facility];  Service: Neurosurgery;  Laterality: Right;  Right L3-4 Microdiskectomy  . LUMBAR WOUND DEBRIDEMENT N/A 08/02/2013   Procedure: INCISION AND DRAINAGE OF LUMBAR WOUND DEBRIDEMENT;  Surgeon: Karn Cassis, MD;  Location: [facility];  Service: Neurosurgery;  Laterality: N/A;  . PERIPHERALLY INSERTED CENTRAL CATHETER INSERTION  2011 & 11/2011  . SHOULDER ARTHROSCOPY Left 09/13/2013   Procedure: ARTHROSCOPIC IRRIGATION AND DEBRIDEMENT, SYNOVECTOMY ,  LEFT SHOULDER ;  Surgeon: Dyke Brackett  Montez HagemanJr., MD;  Location: [facility];  Service: Orthopedics;  Laterality: Left;  . SHOULDER OPEN ROTATOR CUFF REPAIR  ~ 2011   right  . STERNAL SURG  2000   HAS HAD 5-6 ON HIS STERNUM; "caught MRSA in it"  . TONSILLECTOMY  1949    There were no vitals filed for this visit.      Subjective Assessment - 07/01/16 0956    Subjective [facility]  STates he is not having any pain or difficulties today.   Currently in Pain? No/denies                         [facility]/Exercise - 07/01/16  0001      Lumbar Exercises: Seated   Sit to Stand 10 reps     Lumbar Exercises: Supine   Bridge 20 reps;5 seconds   Bridge Limitations 2 sets 5 second hold with G TB on the 2nd set. feet on dynadisk   Straight Leg Raise 10 reps   Straight Leg Raises Limitations 2 sets     Lumbar Exercises: Sidelying   Clam 15 reps   Clam Limitations 2 sets   Hip Abduction 15 reps   Hip Abduction Weights (lbs) 2 sets     Lumbar Exercises: Prone   Other Prone Lumbar Exercises heel squeeze x 15 2 sets   Other Prone Lumbar Exercises hamstring curls AAROM bilaterally 10 reps, PROM quad stretch 3X30"                  PT Short Term Goals - 06/09/16 0938      PT SHORT TERM GOAL #1   Title Patient to be able to ambulate at least 41550ft with rollator and KAFO in order to demonstrate improvement in mobilty    Baseline 7/11- patient reports he is not doing them every day but has started working out again    Time 3   Period Weeks   Status On-going     PT SHORT TERM GOAL #2   Title Patient to be able to tolerate CKC exercise with KAFO unlocked and minimal unsteadiness/instability in order to demonstrate improvement in condition    Baseline 7/11- requires UE support to stand    Time 3   Period Weeks   Status Achieved     PT SHORT TERM GOAL #3   Title Patient to be able to statically stand with KAFO locked and no UE support in order to demonstrate improved balance    Time 3   Period Weeks   Status On-going     PT SHORT TERM GOAL #4   Title Patient will  consistetly perform appropriate HEP correctly, to be updated PRN    Baseline 7/11- patient states pain still getting around 5/10   Time 3   Period Weeks   Status Achieved           PT Long Term Goals - 06/09/16 [id number]      PT LONG TERM GOAL #1   Title Patient will demonstrate L LE strength at least 4-/5 and 5/5 muslce strength in all other tested groups in order to assist in improving gait and stablity    Time 6   Period Weeks    Status On-going     PT LONG TERM GOAL #2   Title Patient to be able to ambulate and perform functional mobility with AFO and rollator rather than KAFO in order to  demonstrate improved strength and mobiltiy    Time 6   Period Weeks   Status On-going     PT LONG TERM GOAL #3   Title Patient to be able to complete TUG balance test in 18 seconds or less wtih rollator in order to demonstrate improved mobility and reduced fall risk    Baseline 8/10: 26 seconds ( significant time to unlock and lock brace   Time 6   Period Weeks   Status On-going     PT LONG TERM GOAL #4   Title Patient to be able to ambulate at least 64850ft with  rollator during 3MWT in order to demonstrate improved overall mobilty    Time 6   Period Weeks   Status On-going               Plan - 07/01/16 1042    Clinical Impression Statement Focus on LE strengtheing and bed mobiltiy this session.  Continued assist needed with prone Hamstring curls for Lt LE, however noted reduced assistance this session.  Able to add additional set of therex and work on bed mobility while completing.  Improved sit to stand this session as well.     Rehab Potential Good   PT Frequency 2x / week   PT Duration 2 weeks  16 weeks   PT Treatment/Interventions Gait training;Functional mobility training;Therapeutic activities;Therapeutic exercise;Balance training;Patient/family education;Manual techniques   PT Next Visit Plan Continue to progress towards goals with LE strengthening as primary focus.       Patient will benefit from skilled therapeutic intervention in order to improve the following deficits and impairments:  Abnormal gait, Decreased activity tolerance, Decreased strength, Decreased balance, Decreased mobility, Difficulty walking, Improper body mechanics, Decreased coordination, Impaired flexibility, Postural dysfunction  Visit Diagnosis: Difficulty in walking, not elsewhere classified  Muscle weakness  (generalized)  Unsteadiness on feet     Problem List Patient Active Problem List   Diagnosis Date Noted  . 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 Infection of urinary tract 10/26/2015  . Intractable back pain 10/22/2015  . Back pain 10/22/2015  . Muscle weakness (generalized) 12/05/2013  . Tight fascia 12/05/2013  . Decreased range of motion of right shoulder 12/05/2013  . Decreased range of motion of left shoulder 12/05/2013  . Bilateral leg weakness 11/21/2013  . Poor balance 11/21/2013  . Difficulty in walking(719.7) 11/21/2013  . Shoulder pain, left 09/12/2013  . Anemia, iron deficiency 09/09/2013  . Dyspnea 09/07/2013  . UTI (urinary tract infection) 09/07/2013  . Acute CHF (congestive heart failure) (HCC) 09/07/2013  . Elevated troponin 09/07/2013  . Fever and chills 09/06/2013  . Altered mental status 09/05/2013  . Neurogenic bowel 08/22/2013  . 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 Gout flare 08/22/2013  . Postoperative wound infection 08/22/2013  . Lumbar degenerative disc disease 08/02/2013  . Post-operative pain 07/29/2013  . Osteonecrosis (HCC) 05/05/2013  . Small bowel obstruction 04/17/2013  . Acute renal failure (HCC) 04/17/2013  . Medial meniscus, posterior horn derangement 02/17/2013  . Knee sprain and strain 02/17/2013  . Trigger point with neck pain 03/18/2012  . Rotator cuff tear arthropathy of right shoulder 09/08/2011  . CAD in native artery 01/09/2011  . Ankle fracture 12/25/2010  . Contusion of shoulder, left 12/25/2010  . PEMPHIGUS VULGARIS 07/02/2010  . MRSA 05/13/2010  . PRURITUS 05/13/2010  . SPINAL STENOSIS, LUMBAR 05/13/2010  . HLD (hyperlipidemia) 04/24/2010  . GASTROESOPHAGEAL REFLUX DISEASE 04/24/2010  . VENTRAL HERNIA,  INCISIONAL 04/24/2010  . DEGENERATIVE JOINT DISEASE 04/24/2010  . PYOGENIC ARTHRITIS, SHOULDER REGION 02/04/2010  . Essential hypertension 01/01/2010  . RUPTURE ROTATOR  CUFF 02/19/2009    Lurena Nida, PTA/CLT [phone]  07/01/2016, 10:47 AM  [facility] [address] [place], [place], [address] Phone: [phone]   Fax:  [phone]  Name: [name] MRN: [id number] Date of Birth: 1941-02-02

## 2016-07-03 ENCOUNTER — Ambulatory Visit (HOSPITAL_COMMUNITY): Payer: Medicare Other | Admitting: Physical Therapy

## 2016-07-03 DIAGNOSIS — R262 Difficulty in walking, not elsewhere classified: Secondary | ICD-10-CM | POA: Diagnosis not present

## 2016-07-03 DIAGNOSIS — N319 Neuromuscular dysfunction of bladder, unspecified: Secondary | ICD-10-CM | POA: Diagnosis not present

## 2016-07-03 DIAGNOSIS — R3915 Urgency of urination: Secondary | ICD-10-CM | POA: Diagnosis not present

## 2016-07-03 DIAGNOSIS — K5901 Slow transit constipation: Secondary | ICD-10-CM | POA: Diagnosis not present

## 2016-07-03 DIAGNOSIS — M6281 Muscle weakness (generalized): Secondary | ICD-10-CM

## 2016-07-03 DIAGNOSIS — R2681 Unsteadiness on feet: Secondary | ICD-10-CM | POA: Diagnosis not present

## 2016-07-03 DIAGNOSIS — N390 Urinary tract infection, site not specified: Secondary | ICD-10-CM | POA: Diagnosis not present

## 2016-07-03 NOTE — Therapy (Signed)
[place], [place], [address] Phone: [phone]   Fax:  [phone]  Physical Therapy Treatment  Patient Details  Name: DARCY CORDNER MRN: [id number] Date of Birth: 1940/08/03 Referring Provider: Sinda Du  Encounter Date: 07/03/2016      PT End of Session - 07/03/16 0954    Visit Number 30   Number of Visits 30   Date for PT Re-Evaluation 06/18/16   Authorization Type Medicare/[employer]    Authorization Time Period 06/09/2016-07/09/2016   g code done on visit 26   Authorization - Visit Number 30   Authorization - Number of Visits 30   PT Start Time 0945   PT Stop Time 1030   PT Time Calculation (min) 45 min   Equipment Utilized During Treatment Gait belt   Activity Tolerance Patient tolerated treatment well;Patient limited by fatigue   Behavior During Therapy [facility] for tasks assessed/performed      Past Medical History:  Diagnosis Date  . Acute renal failure ([place]) 04/17/2013  . Anemia, iron deficiency 09/09/2013  . Anxiety   . Arthritis   . 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 Coronary artery disease    IN 2000   STENT PLACED IN 2012 sees Dr. Lattie Haw, saw last 2013  . Disorder of blood    BEEN TREATED BY DERMATOLOGIST X 4 YRS..."BLOOD BLISTERS"  . Gout   . HTN (hypertension)    sees Dr. [name] in [place]  . Hx MRSA infection    rt shoulder  . Hyperlipidemia   . Pneumonia    "I've had it 3-4 times"  . Small bowel obstruction   . Small bowel problem    HAD ALOT OF SCAR TISSUE FROM PREVIOUS SURGERIES..NG WAS INSERTED ...Marland KitchenMarland KitchenNO SURGERY NEEDED...Marland KitchenMarland KitchenIN FOR 8 DAYS  . Ventral hernia     Past Surgical History:  Procedure Laterality Date  . ANTERIOR CERVICAL CORPECTOMY  12/17/11  . ANTERIOR CERVICAL CORPECTOMY  12/17/2011   Procedure: ANTERIOR CERVICAL CORPECTOMY;  Surgeon: Floyce Stakes, MD;  Location: [place] [facility];  Service: Neurosurgery;  Laterality: N/A;   Anterior Cervical Decompression Fusion Five to Thoracic Two with plating  . BACK SURGERY     lumbar  . CATARACT EXTRACTION W/ INTRAOCULAR LENS  IMPLANT, BILATERAL  ? 2011  . CHOLECYSTECTOMY  2006 "or after"  . CORONARY ANGIOPLASTY WITH STENT PLACEMENT  2012  . CORONARY ARTERY BYPASS GRAFT  2000   CABG X5  . EYE SURGERY     bilateral cataract  . INCISION AND DRAINAGE OF WOUND  ~ 20ll; 12/03/11   "had infection in my right"  . LUMBAR LAMINECTOMY/DECOMPRESSION MICRODISCECTOMY  06/13/2011   Procedure: LUMBAR LAMINECTOMY/DECOMPRESSION MICRODISCECTOMY;  Surgeon: Floyce Stakes;  Location: [place] [facility] ORS;  Service: Neurosurgery;  Laterality: N/A;  Lumbar three, lumbar four-five Laminectomy  . LUMBAR LAMINECTOMY/DECOMPRESSION MICRODISCECTOMY Right 06/17/2013   Procedure: LUMBAR LAMINECTOMY/DECOMPRESSION MICRODISCECTOMY 1 LEVEL;  Surgeon: Floyce Stakes, MD;  Location: [place] [facility] ORS;  Service: Neurosurgery;  Laterality: Right;  Right L3-4 Microdiskectomy  . LUMBAR WOUND DEBRIDEMENT N/A 08/02/2013   Procedure: INCISION AND DRAINAGE OF LUMBAR WOUND DEBRIDEMENT;  Surgeon: Floyce Stakes, MD;  Location: [facility];  Service: Neurosurgery;  Laterality: N/A;  . [place] CATHETER INSERTION  2011 & 11/2011  . SHOULDER ARTHROSCOPY Left 09/13/2013   Procedure: ARTHROSCOPIC IRRIGATION AND DEBRIDEMENT, SYNOVECTOMY ,  LEFT SHOULDER ;  Surgeon: Yvette Rack.,  MD;  Location: [place];  Service: Orthopedics;  Laterality: Left;  . SHOULDER OPEN ROTATOR CUFF REPAIR  ~ 2011   right  . STERNAL SURG  2000   HAS HAD 5-6 ON HIS STERNUM; "caught MRSA in it"  . TONSILLECTOMY  1949    There were no vitals filed for this visit.      Subjective Assessment - 07/03/16 0950    Subjective Pt has started going back to the gym but he does not have a lot of energy.  His stomach is still giving him problems    Currently in Pain? Yes   Pain Score 5    Pain Location Back   Pain Orientation Left   Pain  Descriptors / Indicators Throbbing   Pain Type Chronic pain   Pain Radiating Towards to foot    Pain Onset More than a month ago   Pain Frequency Constant   Aggravating Factors  activity    Pain Relieving Factors sitting    Effect of Pain on Daily Activities increases             [facility] PT Assessment - 07/03/16 0001      Assessment   Medical Diagnosis back fractuer of second lumbar vertebra    Referring Provider Nelia Shi    Onset Date/Surgical Date --  about 3 months ago     Prior Function   Level of Independence Independent;Independent with basic ADLs;Independent with gait;Independent with transfers   Vocation Retired   Leisure none, just get back to walking      Strength   Right Hip Flexion 5/5   Right Hip Extension 2+/5  was 2+/5   Right Hip ABduction 2+/5  was 3-/5    Left Hip Flexion --  5-/5 was 4+/5   Left Hip Extension 2/5  was 2/5   Left Hip ABduction 3-/5  was 3-/5    Right Knee Flexion 3-/5  was 3/5   Right Knee Extension 5/5   Left Knee Flexion 2/5   Left Knee Extension 3+/5  was 4/5    Right Ankle Dorsiflexion 3+/5  was 3+/5    Right Ankle Plantar Flexion 3-/5  was 2+/5    Left Ankle Dorsiflexion 1/5  was 1/5    Left Ankle Plantar Flexion 2/5  was 2/5     6 minute walk test results    Aerobic Endurance Distance Walked 296  Pt walked a total of 452 but 3' walk test was 67'   Endurance additional comments 3:00                     [facility] Adult PT Treatment/Exercise - 07/03/16 0001      Lumbar Exercises: Standing   Functional Squats 10 reps   Other Standing Lumbar Exercises stand at walker and let go try x 5 reps      Lumbar Exercises: Prone   Straight Leg Raise 5 reps   Straight Leg Raises Limitations AA began to bother neck    Other Prone Lumbar Exercises AA ham curl B x 10;                 PT Education - 07/03/16 1038    Education provided Yes   Education Details reviewed that pt needs to try and bear  weight equally on Rt and LT LE;  Pt needs to work on standing with no UE assist.    Person(s) Educated Patient   Methods Explanation   Comprehension Verbalized  understanding;Returned demonstration          PT Short Term Goals - 07/03/16 0954      PT SHORT TERM GOAL #1   Title Patient to be able to ambulate at least 471f with rollator and KAFO in order to demonstrate improvement in mobilty    Baseline 7/11- patient reports he is not doing them every day but has started working out again    Time 3   Period Weeks   Status On-going     PT SHORT TERM GOAL #2   Title Patient to be able to tolerate CKC exercise with KAFO unlocked and minimal unsteadiness/instability in order to demonstrate improvement in condition    Baseline 7/11- requires UE support to stand    Time 3   Period Weeks   Status Achieved     PT SHORT TERM GOAL #3   Title Patient to be able to statically stand with KAFO locked and no UE support in order to demonstrate improved balance    Time 3   Period Weeks   Status Not Met     PT SHORT TERM GOAL #4   Title Patient will  consistetly perform appropriate HEP correctly, to be updated PRN    Baseline 7/11- patient states pain still getting around 5/10   Time 3   Period Weeks   Status Achieved           PT Long Term Goals - 07/03/16 0955      PT LONG TERM GOAL #1   Title Patient will demonstrate L LE strength at least 4-/5 and 5/5 muslce strength in all other tested groups in order to assist in improving gait and stablity    Time 6   Period Weeks   Status Not Met     PT LONG TERM GOAL #2   Title Patient to be able to ambulate and perform functional mobility with AFO and rollator rather than KAFO in order to demonstrate improved strength and mobiltiy    Time 6   Period Weeks   Status On-going     PT LONG TERM GOAL #3   Title Patient to be able to complete TUG balance test in 18 seconds or less wtih rollator in order to demonstrate improved mobility and  reduced fall risk    Baseline 8/10: 26 seconds ( significant time to unlock and lock brace   Time 6   Period Weeks   Status Achieved     PT LONG TERM GOAL #4   Title Patient to be able to ambulate at least 6582fwith  rollator during 3MWT in order to demonstrate improved overall mobilty    Time 6   Period Weeks   Status Not Met               Plan - 07/03/16 1039    Clinical Impression Statement Pt  reassessed to day.  Pt has been ill with GI difficulty for the past 6 weeks.  Overall pt has lost mm strength from last evaluation.   Therapist explained that he is now in a maintenace phase and needs to complete his exercises on his own at home.  He is also staying in his wheelchair at home which therapist spoke to pt about getting out of the wheelchair and using his rollator while in the house.     Rehab Potential Good   PT Frequency 2x / week   PT Duration 2 weeks  16 weeks   PT Treatment/Interventions Gait  training;Functional mobility training;Therapeutic activities;Therapeutic exercise;Balance training;Patient/family education;Manual techniques   PT Next Visit Plan Pt is discharged at this time to home exercise program for lack of progress.    Consulted and Agree with Plan of Care Patient      Patient will benefit from skilled therapeutic intervention in order to improve the following deficits and impairments:  Abnormal gait, Decreased activity tolerance, Decreased strength, Decreased balance, Decreased mobility, Difficulty walking, Improper body mechanics, Decreased coordination, Impaired flexibility, Postural dysfunction  Visit Diagnosis: Difficulty walking  Muscle weakness (generalized)       G-Codes - 17-Jul-2016 1043    Functional Assessment Tool Used 3 minute walk test    Functional Limitation Mobility: Walking and moving around   Mobility: Walking and Moving Around Goal Status [phone]) At least 40 percent but less than 60 percent impaired, limited or restricted    Mobility: Walking and Moving Around Discharge Status [phone]) At least 60 percent but less than 80 percent impaired, limited or restricted      Problem List Patient Active Problem List   Diagnosis Date Noted  . Cauda equina spinal cord injury ([place]) 10/26/2015  . Infection of urinary tract 10/26/2015  . Intractable back pain 10/22/2015  . Back pain 10/22/2015  . Muscle weakness (generalized) 12/05/2013  . Tight fascia 12/05/2013  . Decreased range of motion of right shoulder 12/05/2013  . Decreased range of motion of left shoulder 12/05/2013  . Bilateral leg weakness 11/21/2013  . Poor balance 11/21/2013  . Difficulty in walking(719.7) 11/21/2013  . Shoulder pain, left 09/12/2013  . Anemia, iron deficiency 09/09/2013  . Dyspnea 09/07/2013  . UTI (urinary tract infection) 09/07/2013  . Acute CHF (congestive heart failure) ([place]) 09/07/2013  . Elevated troponin 09/07/2013  . Fever and chills 09/06/2013  . Altered mental status 09/05/2013  . Neurogenic bowel 08/22/2013  . Cauda equina syndrome with neurogenic bladder ([place]) 08/22/2013  . Gout flare 08/22/2013  . Postoperative wound infection 08/22/2013  . Lumbar degenerative disc disease 08/02/2013  . Post-operative pain 07/29/2013  . Osteonecrosis ([place]) 05/05/2013  . Small bowel obstruction 04/17/2013  . Acute renal failure ([place]) 04/17/2013  . Medial meniscus, posterior horn derangement 02/17/2013  . Knee sprain and strain 02/17/2013  . Trigger point with neck pain 03/18/2012  . Rotator cuff tear arthropathy of right shoulder 09/08/2011  . CAD in native artery 01/09/2011  . Ankle fracture 12/25/2010  . Contusion of shoulder, left 12/25/2010  . PEMPHIGUS VULGARIS 07/02/2010  . MRSA 05/13/2010  . PRURITUS 05/13/2010  . SPINAL STENOSIS, LUMBAR 05/13/2010  . HLD (hyperlipidemia) 04/24/2010  . GASTROESOPHAGEAL REFLUX DISEASE 04/24/2010  . VENTRAL HERNIA, INCISIONAL 04/24/2010  . DEGENERATIVE JOINT DISEASE 04/24/2010  . PYOGENIC  ARTHRITIS, SHOULDER REGION 02/04/2010  . Essential hypertension 01/01/2010  . RUPTURE ROTATOR CUFF 02/19/2009   Rayetta Humphrey, PT CLT [phone] 07/17/16, 10:45 AM  [place], [place], [address] Phone: [phone]   Fax:  [phone]  Name: BRISCOE DANIELLO MRN: [id number] Date of Birth: [birth year]  PHYSICAL THERAPY DISCHARGE SUMMARY  Visits from Start of Care: 30  Current functional level related to goals / functional outcomes: See above   Remaining deficits: See above    Education / Equipment: Updated HEP, need to stop using wheelchair in his home and to begin balance activities Plan: Patient agrees to discharge.  Patient goals were not met. Patient is being discharged due to lack of progress.  ?????  Rayetta Humphrey, [place] CLT [phone]

## 2016-07-03 NOTE — Patient Instructions (Addendum)
Functional Quadriceps: Chair Squat    Keeping feet flat on floor, shoulder width apart, squat as low as is comfortable. Use support as necessary. Repeat __10__ times per set. Do __1__ sets per session. Do _3___ sessions per day.  http://orth.exer.us/736   Copyright  VHI. All rights reserved.  Dorsiflexion: Resisted    Facing anchor, tubing around right foot, pull toward face.  Repeat _10___ times per set. Do _1___ sets per session. Do _2___ sessions per day.  http://orth.exer.us/8   Copyright  VHI. All rights reserved.  Alternating Arm Swings    Standing in neutral posture, on floor, Try to keep body still. Hold _5-15___ seconds. Do __5__ repetitions, ___2_ sets.   Copyright  VHI. All rights reserved.  Bridging    Slowly raise buttocks from floor, keeping stomach tight. Repeat _10___ times per set. Do _1___ sets per session. Do _2___ sessions per day.  http://orth.exer.us/1096   Copyright  VHI. All rights reserved.

## 2016-07-07 ENCOUNTER — Ambulatory Visit (HOSPITAL_COMMUNITY): Payer: Medicare Other | Admitting: Physical Therapy

## 2016-07-09 ENCOUNTER — Ambulatory Visit (HOSPITAL_COMMUNITY): Payer: Medicare Other

## 2016-07-15 ENCOUNTER — Encounter (HOSPITAL_COMMUNITY): Payer: Medicare Other

## 2016-07-18 DIAGNOSIS — N319 Neuromuscular dysfunction of bladder, unspecified: Secondary | ICD-10-CM | POA: Diagnosis not present

## 2016-07-18 DIAGNOSIS — K5901 Slow transit constipation: Secondary | ICD-10-CM | POA: Diagnosis not present

## 2016-07-18 DIAGNOSIS — R3915 Urgency of urination: Secondary | ICD-10-CM | POA: Diagnosis not present

## 2016-08-21 DIAGNOSIS — K5901 Slow transit constipation: Secondary | ICD-10-CM | POA: Diagnosis not present

## 2016-08-21 DIAGNOSIS — N319 Neuromuscular dysfunction of bladder, unspecified: Secondary | ICD-10-CM | POA: Diagnosis not present

## 2016-08-21 DIAGNOSIS — N390 Urinary tract infection, site not specified: Secondary | ICD-10-CM | POA: Diagnosis not present

## 2016-08-21 DIAGNOSIS — R3989 Other symptoms and signs involving the genitourinary system: Secondary | ICD-10-CM | POA: Diagnosis not present

## 2016-08-21 DIAGNOSIS — R3915 Urgency of urination: Secondary | ICD-10-CM | POA: Diagnosis not present

## 2016-09-01 ENCOUNTER — Other Ambulatory Visit (HOSPITAL_COMMUNITY): Payer: Self-pay | Admitting: Pulmonary Disease

## 2016-09-01 DIAGNOSIS — R200 Anesthesia of skin: Secondary | ICD-10-CM | POA: Diagnosis not present

## 2016-09-01 DIAGNOSIS — M545 Low back pain: Secondary | ICD-10-CM | POA: Diagnosis not present

## 2016-09-01 DIAGNOSIS — I2510 Atherosclerotic heart disease of native coronary artery without angina pectoris: Secondary | ICD-10-CM | POA: Diagnosis not present

## 2016-09-02 DIAGNOSIS — H31011 Macula scars of posterior pole (postinflammatory) (post-traumatic), right eye: Secondary | ICD-10-CM | POA: Diagnosis not present

## 2016-09-02 DIAGNOSIS — H5203 Hypermetropia, bilateral: Secondary | ICD-10-CM | POA: Diagnosis not present

## 2016-09-02 DIAGNOSIS — H31003 Unspecified chorioretinal scars, bilateral: Secondary | ICD-10-CM | POA: Diagnosis not present

## 2016-09-02 DIAGNOSIS — H52223 Regular astigmatism, bilateral: Secondary | ICD-10-CM | POA: Diagnosis not present

## 2016-09-11 ENCOUNTER — Ambulatory Visit (HOSPITAL_COMMUNITY)
Admission: RE | Admit: 2016-09-11 | Discharge: 2016-09-11 | Disposition: A | Discharge status: Home / Self Care | Payer: Medicare Other | Source: Ambulatory Visit | Attending: Pulmonary Disease | Admitting: Pulmonary Disease

## 2016-09-11 ENCOUNTER — Ambulatory Visit (HOSPITAL_COMMUNITY): Payer: Medicare Other

## 2016-09-11 DIAGNOSIS — R200 Anesthesia of skin: Secondary | ICD-10-CM | POA: Diagnosis not present

## 2016-10-28 DIAGNOSIS — M545 Low back pain: Secondary | ICD-10-CM | POA: Diagnosis not present

## 2016-10-28 DIAGNOSIS — G834 Cauda equina syndrome: Secondary | ICD-10-CM | POA: Diagnosis not present

## 2016-10-28 DIAGNOSIS — I10 Essential (primary) hypertension: Secondary | ICD-10-CM | POA: Diagnosis not present

## 2016-10-28 DIAGNOSIS — I2510 Atherosclerotic heart disease of native coronary artery without angina pectoris: Secondary | ICD-10-CM | POA: Diagnosis not present

## 2016-11-07 DIAGNOSIS — Z981 Arthrodesis status: Secondary | ICD-10-CM | POA: Diagnosis not present

## 2016-11-07 DIAGNOSIS — M545 Low back pain: Secondary | ICD-10-CM | POA: Diagnosis not present

## 2016-11-07 DIAGNOSIS — M5136 Other intervertebral disc degeneration, lumbar region: Secondary | ICD-10-CM | POA: Diagnosis not present

## 2016-12-09 ENCOUNTER — Other Ambulatory Visit (HOSPITAL_COMMUNITY): Payer: Self-pay | Admitting: Pulmonary Disease

## 2016-12-09 DIAGNOSIS — I10 Essential (primary) hypertension: Secondary | ICD-10-CM | POA: Diagnosis not present

## 2016-12-09 DIAGNOSIS — I2510 Atherosclerotic heart disease of native coronary artery without angina pectoris: Secondary | ICD-10-CM | POA: Diagnosis not present

## 2016-12-09 DIAGNOSIS — G834 Cauda equina syndrome: Secondary | ICD-10-CM | POA: Diagnosis not present

## 2016-12-09 DIAGNOSIS — M542 Cervicalgia: Secondary | ICD-10-CM

## 2016-12-09 DIAGNOSIS — M545 Low back pain: Secondary | ICD-10-CM | POA: Diagnosis not present

## 2016-12-09 DIAGNOSIS — R739 Hyperglycemia, unspecified: Secondary | ICD-10-CM | POA: Diagnosis not present

## 2016-12-16 ENCOUNTER — Ambulatory Visit (HOSPITAL_COMMUNITY)
Admission: RE | Admit: 2016-12-16 | Discharge: 2016-12-16 | Disposition: A | Discharge status: Home / Self Care | Payer: Medicare Other | Source: Ambulatory Visit | Attending: Pulmonary Disease | Admitting: Pulmonary Disease

## 2016-12-16 DIAGNOSIS — M542 Cervicalgia: Secondary | ICD-10-CM

## 2016-12-24 ENCOUNTER — Ambulatory Visit (HOSPITAL_COMMUNITY): Payer: Medicare Other

## 2016-12-24 ENCOUNTER — Encounter (HOSPITAL_COMMUNITY): Payer: Self-pay

## 2016-12-30 ENCOUNTER — Ambulatory Visit (INDEPENDENT_AMBULATORY_CARE_PROVIDER_SITE_OTHER): Payer: Medicare Other | Admitting: Urology

## 2016-12-30 DIAGNOSIS — N319 Neuromuscular dysfunction of bladder, unspecified: Secondary | ICD-10-CM | POA: Diagnosis not present

## 2017-01-17 DIAGNOSIS — L299 Pruritus, unspecified: Secondary | ICD-10-CM | POA: Diagnosis not present

## 2017-01-17 DIAGNOSIS — L239 Allergic contact dermatitis, unspecified cause: Secondary | ICD-10-CM | POA: Diagnosis not present

## 2017-01-20 ENCOUNTER — Encounter: Payer: Self-pay | Admitting: *Deleted

## 2017-01-20 ENCOUNTER — Other Ambulatory Visit (HOSPITAL_COMMUNITY)
Admission: RE | Admit: 2017-01-20 | Discharge: 2017-01-20 | Disposition: A | Discharge status: Home / Self Care | Payer: Medicare Other | Source: Ambulatory Visit | Attending: Adult Health | Admitting: Adult Health

## 2017-01-20 ENCOUNTER — Encounter: Payer: Self-pay | Admitting: Adult Health

## 2017-01-20 ENCOUNTER — Ambulatory Visit (INDEPENDENT_AMBULATORY_CARE_PROVIDER_SITE_OTHER): Payer: Medicare Other | Admitting: Adult Health

## 2017-01-20 VITALS — BP 144/76 | HR 64 | Ht 72.0 in | Wt 167.0 lb

## 2017-01-20 DIAGNOSIS — R5383 Other fatigue: Secondary | ICD-10-CM

## 2017-01-20 DIAGNOSIS — E784 Other hyperlipidemia: Secondary | ICD-10-CM | POA: Diagnosis not present

## 2017-01-20 DIAGNOSIS — M7918 Myalgia, other site: Secondary | ICD-10-CM

## 2017-01-20 DIAGNOSIS — R079 Chest pain, unspecified: Secondary | ICD-10-CM

## 2017-01-20 DIAGNOSIS — I2510 Atherosclerotic heart disease of native coronary artery without angina pectoris: Secondary | ICD-10-CM | POA: Diagnosis not present

## 2017-01-20 DIAGNOSIS — M791 Myalgia: Secondary | ICD-10-CM

## 2017-01-20 DIAGNOSIS — E7849 Other hyperlipidemia: Secondary | ICD-10-CM

## 2017-01-20 LAB — BASIC METABOLIC PANEL
Anion gap: 10 (ref 5–15)
BUN: 28 mg/dL — AB (ref 6–20)
CALCIUM: 9.4 mg/dL (ref 8.9–10.3)
CHLORIDE: 101 mmol/L (ref 101–111)
CO2: 24 mmol/L (ref 22–32)
CREATININE: 1.29 mg/dL — AB (ref 0.61–1.24)
GFR calc non Af Amer: 53 mL/min — ABNORMAL LOW (ref >60)
GLUCOSE: 137 mg/dL — AB (ref 65–99)
Potassium: 4.8 mmol/L (ref 3.5–5.1)
Sodium: 135 mmol/L (ref 135–145)

## 2017-01-20 LAB — CBC WITH DIFFERENTIAL/PLATELET
BASOS PCT: 0 %
Basophils Absolute: 0 10*3/uL (ref 0.0–0.1)
EOS ABS: 0 10*3/uL (ref 0.0–0.7)
EOS PCT: 0 %
HCT: 40.2 % (ref 39.0–52.0)
HEMOGLOBIN: 13.4 g/dL (ref 13.0–17.0)
LYMPHS ABS: 1 10*3/uL (ref 0.7–4.0)
Lymphocytes Relative: 10 %
MCH: 30.9 pg (ref 26.0–34.0)
MCHC: 33.3 g/dL (ref 30.0–36.0)
MCV: 92.8 fL (ref 78.0–100.0)
MONO ABS: 0.5 10*3/uL (ref 0.1–1.0)
MONOS PCT: 5 %
NEUTROS PCT: 85 %
Neutro Abs: 7.9 10*3/uL — ABNORMAL HIGH (ref 1.7–7.7)
Platelets: 255 10*3/uL (ref 150–400)
RBC: 4.33 MIL/uL (ref 4.22–5.81)
RDW: 14.5 % (ref 11.5–15.5)
WBC: 9.4 10*3/uL (ref 4.0–10.5)

## 2017-01-20 LAB — LIPID PANEL
CHOLESTEROL: 117 mg/dL (ref 0–200)
HDL: 47 mg/dL (ref >40)
LDL Cholesterol: 52 mg/dL (ref 0–99)
TRIGLYCERIDES: 89 mg/dL (ref <150)
Total CHOL/HDL Ratio: 2.5 RATIO
VLDL: 18 mg/dL (ref 0–40)

## 2017-01-20 NOTE — Progress Notes (Signed)
Cardiology Office Note   Date:  01/20/2017   ID:  Jake MarvelRonald W Pietrzak, DOB 03/08/1941, MRN [id number]  PCP:  Kari BaarsHawkins, Edward, MD  Cardiologist:  Purvis SheffieldKoneswaran  Chief Complaint  Patient presents with  . Coronary Artery Disease  . Chest Pain     History of Present Illness: [name] is a 76 y.o. male who presents for ongoing assessment and management of coronary artery disease, history of coronary artery bypass grafting, chronic systolic and diastolic heart failure, hypertension, and hyperlipidemia. The patient was last seen in the office by  Dr. Purvis SheffieldKoneswaran, on 04/14/2016. The patient was found to be stable, most recent nuclear medicine stress test was in 2014 and negative for ischemia. Continued on current medication regimen. He was to follow-up in one year.  The patient called requesting appointment as he was having fatigue and chest pain.He states that he said having left-sided chest pressure, sometimes awakening him at night. He takes aspirin and it goes away. He's also noticed a change in his energy level. He goes to the gym 3 times a week and works out on the machines. He denies chest discomfort associated with this type of exertion but is noticing that his stamina is decreasing. The patient states that he was having ongoing intermittent chest discomfort for the last several days, before coming today, but it suddenly went away over the last 2 days. He has not had to take nitroglycerin for discomfort. He does his best to remain active.  Past Medical History:  Diagnosis Date  . Acute renal failure (HCC) 04/17/2013  . Anemia, iron deficiency 09/09/2013  . Anxiety   . Arthritis   . 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 Coronary artery disease    IN 2000   STENT PLACED IN 2012 sees Dr. Dietrich Patesothbart, saw last 2013  . Disorder of blood    BEEN TREATED BY DERMATOLOGIST X 4 YRS..."BLOOD BLISTERS"  . Gout   . HTN (hypertension)    [place] Dr. Juanetta [place]Hawkins in [place]  . Hx MRSA  infection    rt shoulder  . Hyperlipidemia   . Pneumonia    "I've had it 3-4 times"  . Small bowel obstruction (HCC)   . Small bowel problem    HAD ALOT OF SCAR TISSUE FROM PREVIOUS SURGERIES..NG WAS INSERTED ...Marland Kitchen.Marland Kitchen.NO SURGERY NEEDED...Marland Kitchen.Marland Kitchen.IN FOR 8 DAYS  . Ventral hernia     Past Surgical History:  Procedure Laterality Date  . ANTERIOR CERVICAL CORPECTOMY  12/17/11  . ANTERIOR CERVICAL CORPECTOMY  12/17/2011   Procedure: ANTERIOR CERVICAL CORPECTOMY;  Surgeon: Karn CassisErnesto M Botero, MD;  Location: [facility];  Service: Neurosurgery;  Laterality: N/A;  Anterior Cervical Decompression Fusion Five to Thoracic Two with plating  . BACK SURGERY     lumbar  . CATARACT EXTRACTION W/ INTRAOCULAR LENS  IMPLANT, BILATERAL  ? 2011  . CHOLECYSTECTOMY  2006 "or after"  . CORONARY ANGIOPLASTY WITH STENT PLACEMENT  2012  . CORONARY ARTERY BYPASS GRAFT  2000   CABG X5  . EYE SURGERY     bilateral cataract  . INCISION AND DRAINAGE OF WOUND  ~ 20ll; 12/03/11   "had infection in my right"  . LUMBAR LAMINECTOMY/DECOMPRESSION MICRODISCECTOMY  06/13/2011   Procedure: LUMBAR LAMINECTOMY/DECOMPRESSION MICRODISCECTOMY;  Surgeon: Karn CassisErnesto M Botero;  Location: [facility];  Service: Neurosurgery;  Laterality: N/A;  Lumbar three, lumbar four-five Laminectomy  . LUMBAR LAMINECTOMY/DECOMPRESSION MICRODISCECTOMY Right 06/17/2013   Procedure: LUMBAR LAMINECTOMY/DECOMPRESSION MICRODISCECTOMY 1 LEVEL;  Surgeon: Lynne LoganErnesto  Sandrea Matte, MD;  Location: [facility];  Service: Neurosurgery;  Laterality: Right;  Right L3-4 Microdiskectomy  . LUMBAR WOUND DEBRIDEMENT N/A 08/02/2013   Procedure: INCISION AND DRAINAGE OF LUMBAR WOUND DEBRIDEMENT;  Surgeon: Karn Cassis, MD;  Location: [facility];  Service: Neurosurgery;  Laterality: N/A;  . PERIPHERALLY INSERTED CENTRAL CATHETER INSERTION  2011 & 11/2011  . SHOULDER ARTHROSCOPY Left 09/13/2013   Procedure: ARTHROSCOPIC IRRIGATION AND DEBRIDEMENT, SYNOVECTOMY ,  LEFT SHOULDER ;  Surgeon: Thera Flake., MD;  Location: [facility];  Service: Orthopedics;  Laterality: Left;  . SHOULDER OPEN ROTATOR CUFF REPAIR  ~ 2011   right  . STERNAL SURG  2000   HAS HAD 5-6 ON HIS STERNUM; "caught MRSA in it"  . TONSILLECTOMY  1949     Current Outpatient Prescriptions  Medication Sig Dispense Refill  . acetaminophen (TYLENOL) 325 MG tablet Take 1-2 tablets (325-650 mg total) by mouth every 4 (four) hours as needed for mild pain.    Marland Kitchen aspirin EC 81 MG tablet Take 81 mg by mouth daily.    Marland Kitchen atorvastatin (LIPITOR) 40 MG tablet TAKE 1 TABLET BY MOUTH EVERY DAY 30 tablet 6  . carvedilol (COREG) 25 MG tablet Take 1 tablet (25 mg total) by mouth 2 (two) times daily with a meal. 60 tablet 12  . docusate sodium (COLACE) 100 MG capsule Take 100 mg by mouth daily.    Marland Kitchen doxazosin (CARDURA) 8 MG tablet Take 8 mg by mouth daily.    . finasteride (PROSCAR) 5 MG tablet Take 5 mg by mouth daily.    . Multiple Vitamin (MULTIVITAMIN) tablet Take 1 tablet by mouth daily.    . nitroGLYCERIN (NITROSTAT) 0.4 MG SL tablet Place 0.4 mg under the tongue every 5 (five) minutes as needed for chest pain. May take up to 3 doses per episode. Contact MD or 911 if unresolved chest pain continues.    . predniSONE (DELTASONE) 20 MG tablet Take 3 tablets (60 mg total) by mouth daily with breakfast. 100 tablet 0  . tamsulosin (FLOMAX) 0.4 MG CAPS capsule Take 1 capsule (0.4 mg total) by mouth daily after breakfast. 30 capsule 1   No current facility-administered medications for this visit.     Allergies:   Morphine; Metoprolol; and Rifampin    Social History:  The patient  reports that he quit smoking about 18 years ago. His smoking use included Cigarettes. He started smoking about 57 years ago. He has a 1.00 pack-year smoking history. He has never used smokeless tobacco. He reports that he does not drink alcohol or use drugs.   Family History:  The patient's family history includes Hypertension in his maternal aunt, maternal  uncle, and mother.    ROS: All other systems are reviewed and negative. Unless otherwise mentioned in H&P    PHYSICAL EXAM: VS:  BP (!) 144/76   Pulse 64   Ht 6' (1.829 m)   Wt 167 lb (75.8 kg)   SpO2 97%   BMI 22.65 kg/m  , BMI Body mass index is 22.65 kg/m. GEN: Well nourished, well developed, in no acute distress  HEENT: normal  Neck: no JVD, carotid bruits, or masses Cardiac: RRR; no murmurs, rubs, or gallops,no edema  Respiratory:  clear to auscultation bilaterally, normal work of breathing GI: soft, nontender, nondistended, + BS MS: no deformity or atrophy he has a leg brace to his right leg and knee. Skin: warm and dry, no rash Neuro:  Strength and sensation are intact Psych: euthymic mood, full affect   EKG: Normal sinus rhythm with nonspecific ST depression, and T-wave abnormality. Heart of 64 bpm   Recent Labs: No results found for requested labs within last 8760 hours.    Lipid Panel    Component Value Date/Time   CHOL 135 10/19/2012 0745   TRIG 75 10/19/2012 0745   HDL 45 10/19/2012 0745   CHOLHDL 3.0 10/19/2012 0745   VLDL 15 10/19/2012 0745   LDLCALC 75 10/19/2012 0745      Wt Readings from Last 3 Encounters:  01/20/17 167 lb (75.8 kg)  04/14/16 176 lb (79.8 kg)  10/22/15 180 lb (81.6 kg)      Other studies Reviewed: Echocardiogram 2013-09-30 Left ventricle: Compared to the echo of 2012 the LVF has declined and the anteroseptum was normal at that time and now appears hypokinetic The cavity size was normal. There was mild focal basal hypertrophy of the septum. The estimated ejection fraction was 45%. Probable mild hypokinesis of the entireanteroseptal myocardium. There was a reduced contribution of atrial contraction to ventricular filling, due to increased ventricular diastolic pressure or atrial contractile dysfunction. Doppler parameters are consistent with a reversible restrictive pattern, indicative of decreased  left ventricular diastolic compliance and/or increased left atrial pressure (grade 3 diastolic dysfunction). - Mitral valve: Mild regurgitation. - Left atrium: The atrium was mildly dilated.   ASSESSMENT AND PLAN:  1. Coronary artery disease: History of coronary artery bypass graft in 2000, with follow-up coronary artery angiography and stent placement in 2012. He has been having some chest discomfort on the left, sometimes awakening him at night. He'll take an aspirin makes him feel better. He is also noticed some increasing fatigue. He denies frank chest pain, dyspnea on exertion, or dizziness. He works out 3 times a week at [employer]. This does not elicit chest pain however he is noticing that his stamina is decreasing. With known history of CAD we will check a Lexiscan Myoview to evaluate for progressive CAD as he has had intervention in 2012 and bypass in 2000.  2. Hypertension: Blood pressure is moderately controlled. He will remain on carvedilol, and aspirin. We checked BMET. CBC.  3. Hypercholesterolemia: Continue statin therapy. Lipids will be drawn.  4. Chronic musculoskeletal abnormalities: Patient has had lumbar laminectomy, degenerative disc disease, wears a brace to his left leg. He tries to remain active so that he "won't stiffen up" and does go to the gym several times a week. He denies any worsening muscle aches and pains. Doubt myalgias from statin therapy.   Current medicines are reviewed at length with the patient today.    Labs/ tests ordered today include: Echocardiogram, stress Lexiscan Myoview, CBC, BMET, lipids.   Bettey Mare. Liborio Nixon, ANP, AACC   01/20/2017 3:52 PM    [facility] [address] [place], [place] [address] Phone: [phone]; Fax: [phone]

## 2017-01-20 NOTE — Patient Instructions (Signed)
Your physician recommends that you schedule a follow-up appointment in: 2-4 Weeks.  Your physician recommends that you continue on your current medications as directed. Please refer to the Current Medication list given to you today.  Your physician has requested that you have an echocardiogram. Echocardiography is a painless test that uses sound waves to create images of your heart. It provides your doctor with information about the size and shape of your heart and how well your heart's chambers and valves are working. This procedure takes approximately one hour. There are no restrictions for this procedure.  Your physician has requested that you have a lexiscan myoview. For further information please visit [email or web address]. Please follow instruction sheet, as given.  Your physician recommends that you return for lab work in: Today  If you need a refill on your cardiac medications before your next appointment, please call your pharmacy.  Thank you for choosing [facility]!

## 2017-01-30 ENCOUNTER — Inpatient Hospital Stay (HOSPITAL_COMMUNITY): Admission: RE | Admit: 2017-01-30 | Payer: Medicare Other | Source: Ambulatory Visit

## 2017-01-30 ENCOUNTER — Encounter (HOSPITAL_COMMUNITY): Payer: Medicare Other

## 2017-01-30 DIAGNOSIS — M545 Low back pain: Secondary | ICD-10-CM | POA: Diagnosis not present

## 2017-01-30 DIAGNOSIS — R21 Rash and other nonspecific skin eruption: Secondary | ICD-10-CM | POA: Diagnosis not present

## 2017-01-30 DIAGNOSIS — G834 Cauda equina syndrome: Secondary | ICD-10-CM | POA: Diagnosis not present

## 2017-01-30 DIAGNOSIS — I10 Essential (primary) hypertension: Secondary | ICD-10-CM | POA: Diagnosis not present

## 2017-01-30 DIAGNOSIS — I5032 Chronic diastolic (congestive) heart failure: Secondary | ICD-10-CM | POA: Diagnosis not present

## 2017-02-05 ENCOUNTER — Encounter: Payer: Self-pay | Admitting: Adult Health

## 2017-02-05 ENCOUNTER — Encounter: Payer: Medicare Other | Admitting: Adult Health

## 2017-02-05 NOTE — Progress Notes (Signed)
Cardiology Office Note   Error.

## 2017-02-10 ENCOUNTER — Ambulatory Visit (HOSPITAL_COMMUNITY)
Admission: RE | Admit: 2017-02-10 | Discharge: 2017-02-10 | Disposition: A | Discharge status: Home / Self Care | Payer: Medicare Other | Source: Ambulatory Visit | Attending: Adult Health | Admitting: Adult Health

## 2017-02-10 ENCOUNTER — Telehealth: Payer: Self-pay | Admitting: *Deleted

## 2017-02-10 DIAGNOSIS — R5383 Other fatigue: Secondary | ICD-10-CM | POA: Insufficient documentation

## 2017-02-10 DIAGNOSIS — I2510 Atherosclerotic heart disease of native coronary artery without angina pectoris: Secondary | ICD-10-CM | POA: Diagnosis not present

## 2017-02-10 DIAGNOSIS — E784 Other hyperlipidemia: Secondary | ICD-10-CM | POA: Insufficient documentation

## 2017-02-10 DIAGNOSIS — I509 Heart failure, unspecified: Secondary | ICD-10-CM | POA: Diagnosis not present

## 2017-02-10 DIAGNOSIS — I110 Hypertensive heart disease with heart failure: Secondary | ICD-10-CM | POA: Insufficient documentation

## 2017-02-10 DIAGNOSIS — E7849 Other hyperlipidemia: Secondary | ICD-10-CM

## 2017-02-10 DIAGNOSIS — I77810 Thoracic aortic ectasia: Secondary | ICD-10-CM | POA: Diagnosis not present

## 2017-02-10 DIAGNOSIS — I081 Rheumatic disorders of both mitral and tricuspid valves: Secondary | ICD-10-CM | POA: Insufficient documentation

## 2017-02-10 DIAGNOSIS — K219 Gastro-esophageal reflux disease without esophagitis: Secondary | ICD-10-CM | POA: Diagnosis not present

## 2017-02-10 NOTE — Telephone Encounter (Signed)
Called patient with test results. No answer. Left message to call back.  

## 2017-02-10 NOTE — Progress Notes (Signed)
*  PRELIMINARY RESULTS* Echocardiogram 2D Echocardiogram has been performed.  Jeryl Columbialliott, Laqueshia Cihlar 02/10/2017, 10:10 AM

## 2017-02-10 NOTE — Telephone Encounter (Signed)
-----   Message from Jodelle GrossKathryn M Lawrence, NP sent at 02/10/2017 12:54 PM EDT ----- Patient echo is essentially normal with mild heart valve calcium noted, none of it severe. No changes in her regmine  Thank you!

## 2017-02-13 ENCOUNTER — Encounter (HOSPITAL_BASED_OUTPATIENT_CLINIC_OR_DEPARTMENT_OTHER)
Admission: RE | Admit: 2017-02-13 | Discharge: 2017-02-13 | Disposition: A | Discharge status: Home / Self Care | Payer: Medicare Other | Source: Ambulatory Visit | Attending: Adult Health | Admitting: Adult Health

## 2017-02-13 ENCOUNTER — Other Ambulatory Visit: Payer: Self-pay

## 2017-02-13 ENCOUNTER — Ambulatory Visit (HOSPITAL_COMMUNITY)
Admission: RE | Admit: 2017-02-13 | Discharge: 2017-02-13 | Disposition: A | Discharge status: Home / Self Care | Payer: Medicare Other | Source: Ambulatory Visit | Attending: Adult Health | Admitting: Adult Health

## 2017-02-13 ENCOUNTER — Encounter (HOSPITAL_COMMUNITY): Payer: Self-pay

## 2017-02-13 DIAGNOSIS — E784 Other hyperlipidemia: Secondary | ICD-10-CM

## 2017-02-13 DIAGNOSIS — R5383 Other fatigue: Secondary | ICD-10-CM

## 2017-02-13 DIAGNOSIS — I2510 Atherosclerotic heart disease of native coronary artery without angina pectoris: Secondary | ICD-10-CM | POA: Insufficient documentation

## 2017-02-13 LAB — NM MYOCAR MULTI W/SPECT W/WALL MOTION / EF
CHL CUP NUCLEAR SSS: 1
LHR: 0.29
LV sys vol: 45 mL
LVDIAVOL: 113 mL (ref 62–150)
NUC STRESS TID: 0.96
Peak HR: 70 {beats}/min
Rest HR: 53 {beats}/min
SDS: 1
SRS: 0

## 2017-02-13 MED ORDER — TECHNETIUM TC 99M TETROFOSMIN IV KIT
30.0000 | PACK | Freq: Once | INTRAVENOUS | Status: AC | PRN
  Administered 2017-02-13: 30.5 via INTRAVENOUS

## 2017-02-13 MED ORDER — TECHNETIUM TC 99M TETROFOSMIN IV KIT
10.0000 | PACK | Freq: Once | INTRAVENOUS | Status: AC | PRN
  Administered 2017-02-13: 10.5 via INTRAVENOUS

## 2017-02-13 MED ORDER — SODIUM CHLORIDE 0.9% FLUSH
INTRAVENOUS | Status: AC
  Administered 2017-02-13: 10 mL via INTRAVENOUS
  Filled 2017-02-13: qty 10

## 2017-02-13 MED ORDER — REGADENOSON 0.4 MG/5ML IV SOLN
INTRAVENOUS | Status: AC
  Administered 2017-02-13: 0.4 mg via INTRAVENOUS
  Filled 2017-02-13: qty 5

## 2017-02-17 ENCOUNTER — Telehealth: Payer: Self-pay | Admitting: *Deleted

## 2017-02-17 DIAGNOSIS — R739 Hyperglycemia, unspecified: Secondary | ICD-10-CM | POA: Diagnosis not present

## 2017-02-17 DIAGNOSIS — I10 Essential (primary) hypertension: Secondary | ICD-10-CM | POA: Diagnosis not present

## 2017-02-17 DIAGNOSIS — I2510 Atherosclerotic heart disease of native coronary artery without angina pectoris: Secondary | ICD-10-CM | POA: Diagnosis not present

## 2017-02-17 DIAGNOSIS — I5032 Chronic diastolic (congestive) heart failure: Secondary | ICD-10-CM | POA: Diagnosis not present

## 2017-02-17 DIAGNOSIS — G834 Cauda equina syndrome: Secondary | ICD-10-CM | POA: Diagnosis not present

## 2017-02-17 NOTE — Telephone Encounter (Signed)
Called patient with test results. No answer. Left message to call back.  

## 2017-02-17 NOTE — Telephone Encounter (Signed)
-----   Message from Jodelle GrossKathryn M Lawrence, NP sent at 02/13/2017  4:20 PM EDT ----- Normal stress test. No changes in regimen. No further testing at this time.

## 2017-02-20 ENCOUNTER — Encounter: Payer: Self-pay | Admitting: Adult Health

## 2017-02-20 ENCOUNTER — Ambulatory Visit (INDEPENDENT_AMBULATORY_CARE_PROVIDER_SITE_OTHER): Payer: Medicare Other | Admitting: Adult Health

## 2017-02-20 VITALS — BP 160/70 | HR 60 | Ht 72.0 in | Wt 169.0 lb

## 2017-02-20 DIAGNOSIS — I2510 Atherosclerotic heart disease of native coronary artery without angina pectoris: Secondary | ICD-10-CM | POA: Diagnosis not present

## 2017-02-20 DIAGNOSIS — I10 Essential (primary) hypertension: Secondary | ICD-10-CM | POA: Diagnosis not present

## 2017-02-20 DIAGNOSIS — E7800 Pure hypercholesterolemia, unspecified: Secondary | ICD-10-CM | POA: Diagnosis not present

## 2017-02-20 NOTE — Patient Instructions (Signed)
Medication Instructions:  Your physician recommends that you continue on your current medications as directed. Please refer to the Current Medication list given to you today.   Labwork: NONE  Testing/Procedures: NONE  Follow-Up: Your physician wants you to follow-up in: 1 Year with Dr. Abigail ButtsKonewaran.  You will receive a reminder letter in the mail two months in advance. If you don't receive a letter, please call our office to schedule the follow-up appointment.   Any Other Special Instructions Will Be Listed Below (If Applicable).     If you need a refill on your cardiac medications before your next appointment, please call your pharmacy.

## 2017-02-20 NOTE — Progress Notes (Signed)
Cardiology Office Note   Date:  02/20/2017   ID:  [name], DOB 06/22/1941, MRN [id number]  PCP:  Kari BaarsHawkins, Edward, MD  Cardiologist:  Purvis SheffieldKoneswaran Chief Complaint  Patient presents with  . Congestive Heart Failure  . Hypertension  . Coronary Artery Disease      History of Present Illness: Jake MarvelRonald W [name] is a 76 y.o. male who presents for ongoing assessment and management of chronic systolic and diastolic heart failure, coronary artery disease with history of CABG, hypertension, and hyperlipidemia. We will last saw the patient on 01/20/2017 at his request for complaints of fatigue and chest pain along with left-sided chest pressure causing him to awaken at nighttime. [facility] evaluate progression of CAD. He was started on aspirin daily.  Study Result 02/13/2017   Clinically negative for ischemia Normal perfusion  The study is normal.  This is a low risk study.  Nuclear stress EF: 61%.   He is doing very well. He is going to a gym 3 times a week walking on a treadmill and using weight machines. He states is beginning to feel stronger and feeling better. He has a significant fear of being put back in a nursing home due to deconditioning. He is working hard at keeping his muscles strong and stay active.   Past Medical History:  Diagnosis Date  . Acute renal failure (HCC) 04/17/2013  . Anemia, iron deficiency 09/09/2013  . Anxiety   . Arthritis   . 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 Coronary artery disease    IN 2000   STENT PLACED IN 2012 sees Dr. Dietrich Patesothbart, saw last 2013  . Disorder of blood    BEEN TREATED BY DERMATOLOGIST X 4 YRS..."BLOOD BLISTERS"  . Gout   . HTN (hypertension)    se[place] Dr. Juanetta [place]Hawkins in [place]  . Hx MRSA infection    rt shoulder  . Hyperlipidemia   . Pneumonia    "I've had it 3-4 times"  . Small bowel obstruction (HCC)   . Small bowel problem    HAD ALOT OF SCAR TISSUE FROM  PREVIOUS SURGERIES..NG WAS INSERTED ...Marland Kitchen.Marland Kitchen.NO SURGERY NEEDED...Marland Kitchen.Marland Kitchen.IN FOR 8 DAYS  . Ventral hernia     Past Surgical History:  Procedure Laterality Date  . ANTERIOR CERVICAL CORPECTOMY  12/17/11  . ANTERIOR CERVICAL CORPECTOMY  12/17/2011   Procedure: ANTERIOR CERVICAL CORPECTOMY;  Surgeon: Karn CassisErnesto M Botero, MD;  Location: [facility];  Service: Neurosurgery;  Laterality: N/A;  Anterior Cervical Decompression Fusion Five to Thoracic Two with plating  . BACK SURGERY     lumbar  . CATARACT EXTRACTION W/ INTRAOCULAR LENS  IMPLANT, BILATERAL  ? 2011  . CHOLECYSTECTOMY  2006 "or after"  . CORONARY ANGIOPLASTY WITH STENT PLACEMENT  2012  . CORONARY ARTERY BYPASS GRAFT  2000   CABG X5  . EYE SURGERY     bilateral cataract  . INCISION AND DRAINAGE OF WOUND  ~ 20ll; 12/03/11   "had infection in my right"  . LUMBAR LAMINECTOMY/DECOMPRESSION MICRODISCECTOMY  06/13/2011   Procedure: LUMBAR LAMINECTOMY/DECOMPRESSION MICRODISCECTOMY;  Surgeon: Karn CassisErnesto M Botero;  Location: [facility];  Service: Neurosurgery;  Laterality: N/A;  Lumbar three, lumbar four-five Laminectomy  . LUMBAR LAMINECTOMY/DECOMPRESSION MICRODISCECTOMY Right 06/17/2013   Procedure: LUMBAR LAMINECTOMY/DECOMPRESSION MICRODISCECTOMY 1 LEVEL;  Surgeon: Karn CassisErnesto M Botero, MD;  Location: [facility];  Service: Neurosurgery;  Laterality: Right;  Right L3-4 Microdiskectomy  . LUMBAR WOUND DEBRIDEMENT N/A 08/02/2013  Procedure: INCISION AND DRAINAGE OF LUMBAR WOUND DEBRIDEMENT;  Surgeon: Karn CassisErnesto M Botero, MD;  Location: [facility];  Service: Neurosurgery;  Laterality: N/A;  . PERIPHERALLY INSERTED CENTRAL CATHETER INSERTION  2011 & 11/2011  . SHOULDER ARTHROSCOPY Left 09/13/2013   Procedure: ARTHROSCOPIC IRRIGATION AND DEBRIDEMENT, SYNOVECTOMY ,  LEFT SHOULDER ;  Surgeon: Thera FlakeW D Caffrey Jr., MD;  Location: [facility];  Service: Orthopedics;  Laterality: Left;  . SHOULDER OPEN ROTATOR CUFF REPAIR  ~ 2011   right  . STERNAL SURG  2000   HAS HAD 5-6 ON HIS  STERNUM; "caught MRSA in it"  . TONSILLECTOMY  1949     Current Outpatient Prescriptions  Medication Sig Dispense Refill  . acetaminophen (TYLENOL) 325 MG tablet Take 1-2 tablets (325-650 mg total) by mouth every 4 (four) hours as needed for mild pain.    Marland Kitchen. aspirin EC 81 MG tablet Take 81 mg by mouth daily.    Marland Kitchen. atorvastatin (LIPITOR) 40 MG tablet TAKE 1 TABLET BY MOUTH EVERY DAY 30 tablet 6  . carvedilol (COREG) 25 MG tablet Take 1 tablet (25 mg total) by mouth 2 (two) times daily with a meal. 60 tablet 12  . docusate sodium (COLACE) 100 MG capsule Take 100 mg by mouth daily.    Marland Kitchen. doxazosin (CARDURA) 8 MG tablet Take 8 mg by mouth daily.    . finasteride (PROSCAR) 5 MG tablet Take 5 mg by mouth daily.    . Multiple Vitamin (MULTIVITAMIN) tablet Take 1 tablet by mouth daily.    . nitroGLYCERIN (NITROSTAT) 0.4 MG SL tablet Place 0.4 mg under the tongue every 5 (five) minutes as needed for chest pain. May take up to 3 doses per episode. Contact MD or 911 if unresolved chest pain continues.    . predniSONE (DELTASONE) 20 MG tablet Take 3 tablets (60 mg total) by mouth daily with breakfast. 100 tablet 0  . tamsulosin (FLOMAX) 0.4 MG CAPS capsule Take 1 capsule (0.4 mg total) by mouth daily after breakfast. 30 capsule 1   No current facility-administered medications for this visit.     Allergies:   Morphine; Metoprolol; and Rifampin    Social History:  The patient  reports that he quit smoking about 18 years ago. His smoking use included Cigarettes. He started smoking about 57 years ago. He has a 1.00 pack-year smoking history. He has never used smokeless tobacco. He reports that he does not drink alcohol or use drugs.   Family History:  The patient's family history includes Heart disease in his unknown relative; Hypertension in his maternal aunt, maternal uncle, and mother.    ROS: All other systems are reviewed and negative. Unless otherwise mentioned in H&P    PHYSICAL EXAM: VS:  BP  (!) 160/70   Pulse 60   Ht 6' (1.829 m)   Wt 169 lb (76.7 kg)   SpO2 97%   BMI 22.92 kg/m  , BMI Body mass index is 22.92 kg/m. GEN: Well nourished, well developed, in no acute distress  HEENT: normal  Neck: no JVD, carotid bruits, or masses Cardiac: RRR; no murmurs, rubs, or gallops,no edema  Respiratory:  clear to auscultation bilaterally, normal work of breathing GI: soft, nontender, nondistended, + BS MS: no deformity or atrophy  Skin: warm and dry, no rash Neuro:  Strength and sensation are intact Psych: euthymic mood, full affect  Recent Labs: 01/20/2017: BUN 28; Creatinine, Ser 1.29; Hemoglobin 13.4; Platelets 255; Potassium 4.8; Sodium 135    Lipid  Panel    Component Value Date/Time   CHOL 117 01/20/2017 1630   TRIG 89 01/20/2017 1630   HDL 47 01/20/2017 1630   CHOLHDL 2.5 01/20/2017 1630   VLDL 18 01/20/2017 1630   LDLCALC 52 01/20/2017 1630      Wt Readings from Last 3 Encounters:  02/20/17 169 lb (76.7 kg)  02/05/17 168 lb (76.2 kg)  01/20/17 167 lb (75.8 kg)      Other studies Reviewed: NM stress test 02/22/17 Study Result    Clinically negative for ischemia Normal perfusion  The study is normal.  This is a low risk study.  Nuclear stress EF: 61%.     ASSESSMENT AND PLAN:  1. Coronary artery disease: Status post coronary artery bypass grafting. Stress Myoview was reassuring, we discussed the results and he is pleased. We'll continue him on his current medication regimen which includes carvedilol, statin therapy, aspirin.  2. History of chronic systolic and diastolic heart failure: Ejection fraction is normalized. There is no evidence of fluid retention or decompensation. He has become more active and is going to the gym 3 times a week. He signed a work himself up concerning time on a treadmill.  3. Hypertension: Blood pressure is elevated today but he has not yet taken his antihypertensive meds. The patient will continue this regimen. He is  advised to take medications as directed   Current medicines are reviewed at length with the patient today.    Labs/ tests ordered today include:  Bettey Mare. Liborio Nixon, ANP, AACC   02/20/2017 3:12 PM    [facility] [address], [place], [place] [address] Phone: [phone]; Fax: [phone]

## 2017-04-30 DIAGNOSIS — Z23 Encounter for immunization: Secondary | ICD-10-CM | POA: Diagnosis not present

## 2017-05-05 DIAGNOSIS — H3530 Unspecified macular degeneration: Secondary | ICD-10-CM | POA: Diagnosis not present

## 2017-05-06 NOTE — Progress Notes (Signed)
[facility] Note  05/07/2017     CHIEF COMPLAINT Patient presents for Retina Evaluation   HISTORY OF PRESENT ILLNESS: [name] is a 76 y.o. male who presents to the clinic today for:   HPI    Retina Evaluation  In both eyes.  This started 1 week ago.  Duration of 1 week.  Associated Symptoms Floaters, Flashes, Blind Spot, Pain, Distortion and Glare.  Negative for Redness, Trauma, Shoulder/Hip pain, Fatigue, Jaw Claudication, Photophobia, Scalp Tenderness and Fever.  Context:  distance vision, mid-range vision and near vision.  Treatments tried include no treatments.  I, the attending physician,  performed the HPI with the patient and updated documentation appropriately.        Comments  Referred by Dr. Judi Saa retina / Vitreitis ; Patient states he got an eye disease 35-40 yrs ago and laser tx  was done and it has came back . He has cloudy vision in right  eye x 4-5 days . He states OD has blind spots due to laser Tx.Marland Kitchen He has occasional  floaters OU. He does not use eye gtts. He occasionally takes multivitamins.     Last edited by Bernarda Caffey, MD on 05/07/2017  3:56 PM. (History)      Referring physician: Madelin Headings, DO [address] [place], [place] [address]  HISTORICAL INFORMATION:   Selected notes from the MEDICAL RECORD NUMBER Referral from Dr. 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 (Ophthalmic Drugs)   No current facility-administered medications for this visit.  (Ophthalmic Drugs)   Current Outpatient Prescriptions (Other)  Medication Sig  . acetaminophen (TYLENOL) 325 MG tablet Take 1-2 tablets (325-650 mg total) by mouth every 4 (four) hours as needed for mild  pain.  Marland Kitchen aspirin EC 81 MG tablet Take 81 mg by mouth daily.  Marland Kitchen atorvastatin (LIPITOR) 40 MG tablet TAKE 1 TABLET BY MOUTH EVERY DAY  . carvedilol (COREG) 25 MG tablet Take 1 tablet (25 mg total) by mouth 2 (two) times daily with a meal.  . docusate sodium (COLACE) 100 MG capsule Take 100 mg by mouth daily.  Marland Kitchen doxazosin (CARDURA) 8 MG tablet Take 8 mg by mouth daily.  . finasteride (PROSCAR) 5 MG tablet Take 5 mg by mouth daily.  Marland Kitchen HYDROcodone-acetaminophen (NORCO) 10-325 MG tablet TK 1 T PO QID  . Multiple Vitamin (MULTIVITAMIN) tablet Take 1 tablet by mouth daily.  Marland Kitchen MYRBETRIQ 25 MG TB24 tablet TK 1 T PO QD  . nitrofurantoin, macrocrystal-monohydrate, (MACROBID) 100 MG capsule TK 1 C PO BID FOR 7 DAYS  . nitroGLYCERIN (NITROSTAT) 0.4 MG SL tablet Place 0.4 mg under the tongue every 5 (five) minutes as needed for chest pain. May take up to 3 doses per episode. Contact MD or 911 if unresolved chest pain continues.  . predniSONE (DELTASONE) 20 MG tablet Take 3 tablets (60 mg total) by mouth daily with breakfast.  . tamsulosin (FLOMAX) 0.4 MG CAPS capsule Take 1 capsule (0.4 mg total) by mouth daily after breakfast.   No current facility-administered medications for this visit.  (Other)      REVIEW OF SYSTEMS: ROS    Positive for: Gastrointestinal, Neurological, Genitourinary, Musculoskeletal, HENT, Cardiovascular, Eyes, Respiratory, Psychiatric, Allergic/Imm, Heme/Lymph  Negative for: Constitutional, Skin   Last edited by Zenovia Jordan, LPN on 62/13/0865  7:84 AM. (History)       ALLERGIES Allergies  Allergen Reactions  . Morphine Other (See Comments)    REACTION: made him go crazy; "I had fun w/it; my family didn't think it was too funny"  . Metoprolol Other (See Comments)    REACTION:  Tachycardia; "don't remember how bad it was; it was so long ago"  . Rifampin     Itch     PAST MEDICAL HISTORY Past Medical History:  Diagnosis Date  . Acute renal failure ([place])  04/17/2013  . Anemia, iron deficiency 09/09/2013  . Anxiety   . Arthritis   . 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 Coronary artery disease    IN 2000   STENT PLACED IN 2012 sees Dr. Lattie Haw, saw last 2013  . Disorder of blood    BEEN TREATED BY DERMATOLOGIST X 4 YRS..."BLOOD BLISTERS"  . Gout   . HTN (hypertension)    sees Dr. [name] in [place]  . Hx MRSA infection    rt shoulder  . Hyperlipidemia   . Pneumonia    "I've had it 3-4 times"  . Small bowel obstruction ([place])   . Small bowel problem    HAD ALOT OF SCAR TISSUE FROM PREVIOUS SURGERIES..NG WAS INSERTED ...Marland KitchenMarland KitchenNO SURGERY NEEDED...Marland KitchenMarland KitchenIN FOR 8 DAYS  . Ventral hernia    Past Surgical History:  Procedure Laterality Date  . ANTERIOR CERVICAL CORPECTOMY  12/17/11  . ANTERIOR CERVICAL CORPECTOMY  12/17/2011   Procedure: ANTERIOR CERVICAL CORPECTOMY;  Surgeon: Floyce Stakes, MD;  Location: [facility];  Service: Neurosurgery;  Laterality: N/A;  Anterior Cervical Decompression Fusion Five to Thoracic Two with plating  . BACK SURGERY     lumbar  . CATARACT EXTRACTION W/ INTRAOCULAR LENS  IMPLANT, BILATERAL  ? 2011  . CHOLECYSTECTOMY  2006 "or after"  . CORONARY ANGIOPLASTY WITH STENT PLACEMENT  2012  . CORONARY ARTERY BYPASS GRAFT  2000   CABG X5  . EYE SURGERY     bilateral cataract  . INCISION AND DRAINAGE OF WOUND  ~ 20ll; 12/03/11   "had infection in my right"  . LUMBAR LAMINECTOMY/DECOMPRESSION MICRODISCECTOMY  06/13/2011   Procedure: LUMBAR LAMINECTOMY/DECOMPRESSION MICRODISCECTOMY;  Surgeon: Floyce Stakes;  Location: [facility];  Service: Neurosurgery;  Laterality: N/A;  Lumbar three, lumbar four-five Laminectomy  . LUMBAR LAMINECTOMY/DECOMPRESSION MICRODISCECTOMY Right 06/17/2013   Procedure: LUMBAR LAMINECTOMY/DECOMPRESSION MICRODISCECTOMY 1 LEVEL;  Surgeon: Floyce Stakes, MD;  Location: [facility];  Service: Neurosurgery;  Laterality: Right;  Right L3-4 Microdiskectomy  . LUMBAR WOUND  DEBRIDEMENT N/A 08/02/2013   Procedure: INCISION AND DRAINAGE OF LUMBAR WOUND DEBRIDEMENT;  Surgeon: Floyce Stakes, MD;  Location: [facility];  Service: Neurosurgery;  Laterality: N/A;  . [place] CATHETER INSERTION  2011 & 11/2011  . SHOULDER ARTHROSCOPY Left 09/13/2013   Procedure: ARTHROSCOPIC IRRIGATION AND DEBRIDEMENT, SYNOVECTOMY ,  LEFT SHOULDER ;  Surgeon: Yvette Rack., MD;  Location: [facility];  Service: Orthopedics;  Laterality: Left;  . SHOULDER OPEN ROTATOR CUFF REPAIR  ~ 2011   right  . STERNAL SURG  2000   HAS HAD 5-6 ON HIS STERNUM; "caught MRSA in it"  . TONSILLECTOMY  1949    FAMILY HISTORY Family History  Problem Relation Age of Onset  . Heart disease Unknown   . Hypertension Mother   . Hypertension  Maternal Aunt   . Hypertension Maternal Uncle   . Anesthesia problems Neg Hx   . Hypotension Neg Hx   . Malignant hyperthermia Neg Hx   . Pseudochol deficiency Neg Hx     SOCIAL HISTORY Social History  Substance Use Topics  . Smoking status: Former Smoker    Packs/day: 1.00    Years: 1.00    Types: Cigarettes    Start date: 12/29/1959    Quit date: 12/27/1998  . Smokeless tobacco: Never Used  . Alcohol use No         OPHTHALMIC EXAM:  Base Eye Exam    Visual Acuity (Snellen - Linear)      Right Left   Dist cc CF at 3' 20/30 +2   Dist ph cc NI NI       Tonometry (Tonopen, 9:22 AM)      Right Left   Pressure 14 14       Pupils      Dark Light Shape React APD   Right 3  Round Minimal None   Left 3 2 Round Slow None       Visual Fields (Counting fingers)      Left Right    Full Full       Extraocular Movement      Right Left    Full Full       Neuro/Psych    Oriented x3:  Yes   Mood/Affect:  Normal       Dilation    Both eyes:  1.0% Mydriacyl, 2.5% Phenylephrine @ 9:22 AM        Slit Lamp and Fundus Exam    External Exam      Right Left   External Brow ptosis Brow ptosis       Slit Lamp Exam      Right  Left   Lids/Lashes Dermatochalasis - upper lid Dermatochalasis - upper lid   Conjunctiva/Sclera White and quiet White and quiet   Cornea Arcus Arcus   Anterior Chamber 1-2+ Cell / pigment Deep and quiet   Iris Round and moderate dilation to 5.7 mm; iridodenisis Round dilated to 7 mm; iridodenesis;    Lens Posterior chamber intraocular lens - displaced nasally; pseudophakodensis;  Posterior chamber intraocular lens - in good postion; pseudophakodenesis; , Posterior capsular opacification - trace   Vitreous Vitreous syneresis, Posterior vitreous detachment; debris Vitreous syneresis       Fundus Exam      Right Left   Disc Peripapillary atrophy 360 Peripapillary atrophy 360   C/D Ratio 0.7 0.5   Macula Large chorioretinal scar abutting fovea; 2 small histospots - inferior macula; confluent patches of CR scarring inferiorly; no heme or CNVM ERM; small histo spots inferior macula; small RPE changes inferiorly;    Vessels Normal Normal   Periphery Attached; scatterd pigmented CR scars 360 Attached; Scattered pigmented CR scarring 360          IMAGING AND PROCEDURES  Imaging and Procedures for 05/07/17  OCT, Retina - OU - Both Eyes     Right Eye Quality was good. Central Foveal Thickness: 291. Progression has no prior data. Findings include abnormal foveal contour, central atrophy, outer retinal atrophy, no IRF, no SRF (IRHM; thin choroid; ).   Left Eye Quality was good. Central Foveal Thickness: 317. Progression has no prior data. Findings include normal foveal contour, no IRF, no SRF, epiretinal membrane (Thin choroid; scattered outer retinal atrophy).   Notes Images taken, stored on drive  Diagnosis / Impression:  OD: extensive macular scarring and atrophy 2/2 POHS; no IRF/SRF OS: normal foveal contour; no IRF/SRF; mild ERM  Clinical management:  See below  Abbreviations: NFP - Normal foveal profile. CME - cystoid macular edema. PED - pigment epithelial detachment. IRF -  intraretinal fluid. SRF - subretinal fluid. EZ - ellipsoid zone. ERM - epiretinal membrane. ORA - outer retinal atrophy. ORT - outer retinal tubulation. 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 Pseudophakia of both eyes Z96.1   4. 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 Posterior vitreous detachment of both eyes H43.813     1,2. POHS OU with macular scar OD  - diagnosed ~35 yrs ago with history of macular laser OD  - large macular scar OD involving fovea -- reports history of low vision OD since receiving laser  - no active heme or CNVM noted today  - will monitor for now  3, 4, 5. Pseudophakia OU w/ subluxed IOL OD, pseudophakodonesis OS  - discussed findings and prognosis, and treatment options for dislocated IOL including observation, pars plana vitrectomy, lens explantation, lens implantation in either the anterior chamber or scleral fixated intraocular lens.   - Discussed risks of surgery including but not limited to bleeding, infection, corneal swelling, retinal tear, detachment, loss of vision, loss of eye.  - discussed likely limited visual benefit of surgery given extensive macular scar  - All questions answered for patient  - IOP okay and no UGH syndrome at this point  - pt wishes to observe for now given likely limited visual benefit and his medical comorbidities -- reasonable  - will f/u in 1 month, sooner prn  6. PVD OU  - vitreous clear with debris likely from IOL dislocation in addition to PVD/vitreous syneresis -- no vitritis  - Discussed findings and prognosis  - No RT or RD on 360 exam  - Reviewed s/s of RT/RD  - Strict return precautions for any such RT/RD signs/symptoms    Ophthalmic Meds Ordered this visit:  No orders  of the defined types were placed in this encounter.      Return in about 1 month (around 06/07/2017) for F/U displaced lens OD.  There are no Patient Instructions on file for this visit.   Explained the diagnoses, plan, and follow up with the patient and they expressed understanding.  Patient expressed understanding of the importance of proper follow up care.   Gardiner Sleeper, M.D., Ph.D. Vitreoretinal Surgeon [facility] 05/07/17     Abbreviations: M myopia (nearsighted); A astigmatism; H hyperopia (farsighted); P presbyopia; Mrx spectacle prescription;  CTL contact lenses; OD right eye; OS left eye; OU both eyes  XT exotropia; ET esotropia; PEK punctate epithelial keratitis; PEE punctate epithelial erosions; DES dry eye syndrome; MGD meibomian gland dysfunction; ATs artificial tears; PFAT's preservative free artificial tears; [place] nuclear sclerotic cataract; PSC posterior subcapsular cataract; ERM epi-retinal membrane; PVD posterior vitreous detachment; RD retinal detachment; DM diabetes mellitus; DR diabetic retinopathy; NPDR non-proliferative diabetic retinopathy; PDR proliferative diabetic retinopathy; CSME clinically significant macular edema; DME diabetic macular edema; dbh dot blot hemorrhages; CWS cotton wool spot; POAG primary open  angle glaucoma; C/D cup-to-disc ratio; HVF humphrey visual field; GVF goldmann visual field; OCT optical coherence tomography; IOP intraocular pressure; BRVO Branch retinal vein occlusion; CRVO central retinal vein occlusion; CRAO central retinal artery occlusion; BRAO branch retinal artery occlusion; RT retinal tear; SB scleral buckle; PPV pars plana vitrectomy; VH Vitreous hemorrhage; PRP panretinal laser photocoagulation; IVK intravitreal kenalog; VMT vitreomacular traction; MH Macular hole;  NVD neovascularization of the disc; NVE neovascularization elsewhere; AREDS age related eye disease study; ARMD age related macular  degeneration; POAG primary open angle glaucoma; EBMD epithelial/anterior basement membrane dystrophy; ACIOL anterior chamber intraocular lens; IOL intraocular lens; PCIOL posterior chamber intraocular lens; Phaco/IOL phacoemulsification with intraocular lens placement; [place] photorefractive keratectomy; LASIK laser assisted in situ keratomileusis; HTN hypertension; DM diabetes mellitus; COPD chronic obstructive pulmonary disease

## 2017-05-07 ENCOUNTER — Encounter (INDEPENDENT_AMBULATORY_CARE_PROVIDER_SITE_OTHER): Payer: Self-pay | Admitting: Ophthalmology

## 2017-05-07 ENCOUNTER — Ambulatory Visit (INDEPENDENT_AMBULATORY_CARE_PROVIDER_SITE_OTHER): Payer: Medicare Other | Admitting: Ophthalmology

## 2017-05-07 DIAGNOSIS — B399 Histoplasmosis, unspecified: Secondary | ICD-10-CM | POA: Diagnosis not present

## 2017-05-07 DIAGNOSIS — Z961 Presence of intraocular lens: Secondary | ICD-10-CM | POA: Diagnosis not present

## 2017-05-07 DIAGNOSIS — H43813 Vitreous degeneration, bilateral: Secondary | ICD-10-CM | POA: Diagnosis not present

## 2017-05-07 DIAGNOSIS — H31011 Macula scars of posterior pole (postinflammatory) (post-traumatic), right eye: Secondary | ICD-10-CM | POA: Diagnosis not present

## 2017-05-07 DIAGNOSIS — H32 Chorioretinal disorders in diseases classified elsewhere: Secondary | ICD-10-CM

## 2017-05-07 DIAGNOSIS — H278 Other specified disorders of lens: Secondary | ICD-10-CM | POA: Diagnosis not present

## 2017-05-07 DIAGNOSIS — T8522XA Displacement of intraocular lens, initial encounter: Secondary | ICD-10-CM

## 2017-05-21 DIAGNOSIS — I5032 Chronic diastolic (congestive) heart failure: Secondary | ICD-10-CM | POA: Diagnosis not present

## 2017-05-21 DIAGNOSIS — G834 Cauda equina syndrome: Secondary | ICD-10-CM | POA: Diagnosis not present

## 2017-05-21 DIAGNOSIS — I2510 Atherosclerotic heart disease of native coronary artery without angina pectoris: Secondary | ICD-10-CM | POA: Diagnosis not present

## 2017-05-21 DIAGNOSIS — I10 Essential (primary) hypertension: Secondary | ICD-10-CM | POA: Diagnosis not present

## 2017-05-29 ENCOUNTER — Other Ambulatory Visit (HOSPITAL_COMMUNITY): Payer: Self-pay | Admitting: Pulmonary Disease

## 2017-05-29 DIAGNOSIS — M545 Low back pain: Secondary | ICD-10-CM

## 2017-06-05 NOTE — Progress Notes (Signed)
[facility] Note  06/08/2017     CHIEF COMPLAINT Patient presents for Retina Follow Up   HISTORY OF PRESENT ILLNESS: [name] is a 76 y.o. male who presents to the clinic today for:   HPI    Retina Follow Up    Patient presents with  Other (F/U POHS OU with macular scar).  In right eye.  Severity is moderate.  Duration of 1 month.  Since onset it is stable.  I, the attending physician,  performed the HPI with the patient and updated documentation appropriately.          Comments    Pt states that OU VA is stable; Pt reports that he continues to have floaters off and on; Pt denies flashes, denies wavy VA, denies ocular pain; Pt denies taking any eye gtts, denies taking any eye vits;        Last edited by Bernarda Caffey, MD on 06/08/2017  9:52 AM. (History)      Referring physician: Sinda Du, MD [address] [place], [place] [address]  HISTORICAL INFORMATION:   Selected notes from the MEDICAL RECORD NUMBER Referral from Dr. 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 (Ophthalmic Drugs)   No current facility-administered medications for this visit.  (Ophthalmic Drugs)   Current Outpatient Medications (Other)  Medication Sig  . acetaminophen (TYLENOL) 325 MG tablet Take 1-2 tablets (325-650 mg total) by mouth every 4 (four) hours as needed for mild pain.  Marland Kitchen aspirin EC 81 MG tablet Take 81 mg by mouth daily.  Marland Kitchen atorvastatin (LIPITOR) 40 MG tablet TAKE 1 TABLET BY MOUTH EVERY DAY  . carvedilol (COREG) 25 MG tablet Take 1 tablet (25 mg total) by mouth 2 (two) times daily with a meal.  . docusate sodium (COLACE) 100 MG capsule Take 100 mg by mouth daily.  Marland Kitchen doxazosin  (CARDURA) 8 MG tablet Take 8 mg by mouth daily.  . finasteride (PROSCAR) 5 MG tablet Take 5 mg by mouth daily.  Marland Kitchen HYDROcodone-acetaminophen (NORCO) 10-325 MG tablet TK 1 T PO QID  . Multiple Vitamin (MULTIVITAMIN) tablet Take 1 tablet by mouth daily.  Marland Kitchen MYRBETRIQ 25 MG TB24 tablet TK 1 T PO QD  . nitrofurantoin, macrocrystal-monohydrate, (MACROBID) 100 MG capsule TK 1 C PO BID FOR 7 DAYS  . nitroGLYCERIN (NITROSTAT) 0.4 MG SL tablet Place 0.4 mg under the tongue every 5 (five) minutes as needed for chest pain. May take up to 3 doses per episode. Contact MD or 911 if unresolved chest pain continues.  . predniSONE (DELTASONE) 20 MG tablet Take 3 tablets (60 mg total) by mouth daily with breakfast.  . tamsulosin (FLOMAX) 0.4 MG CAPS capsule Take 1 capsule (0.4 mg total) by mouth daily after breakfast.   No current facility-administered medications for this visit.  (Other)      REVIEW OF SYSTEMS: ROS    Positive for: Constitutional, Musculoskeletal, Eyes   Negative for: Gastrointestinal, Neurological, Skin, Genitourinary, HENT, Endocrine, Cardiovascular, Respiratory, Psychiatric, Allergic/Imm, Heme/Lymph   Last edited by Alyse Low on 06/08/2017  9:38 AM. (History)       ALLERGIES Allergies  Allergen Reactions  . Morphine Other (See Comments)    REACTION: made him go  crazy; "I had fun w/it; my family didn't think it was too funny"  . Metoprolol Other (See Comments)    REACTION:  Tachycardia; "don't remember how bad it was; it was so long ago"  . Rifampin     Itch     PAST MEDICAL HISTORY Past Medical History:  Diagnosis Date  . Acute renal failure ([place]) 04/17/2013  . Anemia, iron deficiency 09/09/2013  . Anxiety   . Arthritis   . 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 Coronary artery disease    IN 2000   STENT PLACED IN 2012 sees Dr. Lattie Haw, saw last 2013  . Disorder of blood    BEEN TREATED BY DERMATOLOGIST X 4 YRS..."BLOOD BLISTERS"  . Gout    . HTN (hypertension)    sees Dr. [name] in [place]  . Hx MRSA infection    rt shoulder  . Hyperlipidemia   . Pneumonia    "I've had it 3-4 times"  . Small bowel obstruction ([place])   . Small bowel problem    HAD ALOT OF SCAR TISSUE FROM PREVIOUS SURGERIES..NG WAS INSERTED ...Marland KitchenMarland KitchenNO SURGERY NEEDED...Marland KitchenMarland KitchenIN FOR 8 DAYS  . Ventral hernia    Past Surgical History:  Procedure Laterality Date  . ANTERIOR CERVICAL CORPECTOMY  12/17/11  . BACK SURGERY     lumbar  . CATARACT EXTRACTION W/ INTRAOCULAR LENS  IMPLANT, BILATERAL  ? 2011  . CHOLECYSTECTOMY  2006 "or after"  . CORONARY ANGIOPLASTY WITH STENT PLACEMENT  2012  . CORONARY ARTERY BYPASS GRAFT  2000   CABG X5  . EYE SURGERY     bilateral cataract  . INCISION AND DRAINAGE OF WOUND  ~ 20ll; 12/03/11   "had infection in my right"  . PERIPHERALLY INSERTED CENTRAL CATHETER INSERTION  2011 & 11/2011  . SHOULDER OPEN ROTATOR CUFF REPAIR  ~ 2011   right  . STERNAL SURG  2000   HAS HAD 5-6 ON HIS STERNUM; "caught MRSA in it"  . TONSILLECTOMY  1949    FAMILY HISTORY Family History  Problem Relation Age of Onset  . Heart disease Unknown   . Hypertension Mother   . Hypertension Maternal Aunt   . Hypertension Maternal Uncle   . Anesthesia problems Neg Hx   . Hypotension Neg Hx   . Malignant hyperthermia Neg Hx   . Pseudochol deficiency Neg Hx     SOCIAL HISTORY Social History   Tobacco Use  . Smoking status: Former Smoker    Packs/day: 1.00    Years: 1.00    Pack years: 1.00    Types: Cigarettes    Start date: 12/29/1959    Last attempt to quit: 12/27/1998    Years since quitting: 18.4  . Smokeless tobacco: Never Used  Substance Use Topics  . Alcohol use: No    Alcohol/week: 0.0 oz  . Drug use: No         OPHTHALMIC EXAM:  Base Eye Exam    Visual Acuity (Snellen - Linear)      Right Left   Dist cc 20/80 20/30 +2   Dist ph cc NI NI   Correction:  Glasses       Tonometry (Tonopen, 9:43 AM)      Right Left    Pressure 14 13       Pupils      Dark Light Shape React APD   Right 2 1 Round Brisk None   Left 2 1 Round Brisk None  Visual Fields (Counting fingers)      Left Right    Full Full       Extraocular Movement      Right Left    Full, Ortho Full, Ortho       Neuro/Psych    Oriented x3:  Yes   Mood/Affect:  Normal       Dilation    Both eyes:  1.0% Mydriacyl, 2.5% Phenylephrine @ 9:43 AM        Slit Lamp and Fundus Exam    External Exam      Right Left   External Brow ptosis Brow ptosis       Slit Lamp Exam      Right Left   Lids/Lashes Dermatochalasis - upper lid Dermatochalasis - upper lid   Conjunctiva/Sclera White and quiet White and quiet   Cornea Arcus Arcus   Anterior Chamber 1-2+ Cell / pigment Deep and quiet   Iris Round and moderate dilation to 5.7 mm; iridodenisis Round dilated to 7 mm; iridodenesis;    Lens Posterior chamber intraocular lens - displaced nasally to 1/3 to 1/2 pupil, pseudophakodensis Posterior chamber intraocular lens - in good postion; pseudophakodenesis; , Posterior capsular opacification - trace   Vitreous Vitreous syneresis, Posterior vitreous detachment; debris Vitreous syneresis       Fundus Exam      Right Left   Disc Peripapillary atrophy 360 Peripapillary atrophy 360   C/D Ratio 0.7 0.5   Macula Large chorioretinal scar abutting fovea; 2 small histospots - inferior macula; confluent patches of CR scarring inferiorly; no heme or CNVM ERM; small histo spots inferior macula; small RPE changes inferiorly;    Vessels Normal Normal   Periphery Attached; scatterd pigmented CR scars 360 Attached; Scattered pigmented CR scarring 360          IMAGING AND PROCEDURES  Imaging and Procedures for 06/08/17  OCT, Retina - OU - Both Eyes     Right Eye Quality was good. Central Foveal Thickness: 257. Progression has been stable. Findings include abnormal foveal contour, outer retinal atrophy, no IRF, no SRF, outer retinal tubulation,  epiretinal membrane (IRHM; thin choroid;).   Left Eye Quality was good. Central Foveal Thickness: 298. Progression has been stable. Findings include normal foveal contour, no IRF, no SRF, epiretinal membrane (Thin choroid; scattered outer retinal atrophy).   Notes Images taken, stored on drive  Diagnosis / Impression:  OD: larger macular scar with ERM, outer retinal atrophy, outer retinal tubulation OS: ERM, scattered outer retinal atrophy  Clinical management:  See below  Abbreviations: NFP - Normal foveal profile. CME - cystoid macular edema. PED - pigment epithelial detachment. IRF - intraretinal fluid. SRF - subretinal fluid. EZ - ellipsoid zone. ERM - epiretinal membrane. ORA - outer retinal atrophy. ORT - outer retinal tubulation. SRHM - subretinal hyper-reflective material                  ASSESSMENT/PLAN:    ICD-10-CM   1. 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 Pseudophakia of both eyes Z96.1   4. 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 Posterior vitreous detachment of both eyes H43.813     1,2. POHS OU with macular scar OD  - diagnosed ~35 yrs ago with history of macular laser OD  - large macular scar OD involving  fovea -- reports history of low vision OD since receiving laser  - no active heme or CNVM noted today  - will monitor for now  3, 4, 5. Pseudophakia OU w/ subluxed IOL OD, pseudophakodonesis OS  - discussed findings and prognosis, and treatment options for dislocated IOL including observation, pars plana vitrectomy, lens explantation, lens implantation in either the anterior chamber or scleral fixated intraocular lens.   - Discussed risks of surgery including but not limited to bleeding, infection, corneal swelling, retinal tear, detachment, loss of vision, loss of eye.  - re-discussed likely limited visual  benefit of surgery given extensive macular scar  - All questions answered for patient  - IOP okay and no UGH syndrome at this point  - pt wishes to observe for now given likely limited visual benefit and his medical comorbidities  - discussed s/s of complete IOL dislocation and possible need for surgery should IOL displace posteriorly  - advised limiting activities to prevent complete IOL dislocation OD  - will f/u in 8 weeks, sooner prn  6. PVD OU  - vitreous clear with debris likely from IOL dislocation in addition to PVD/vitreous syneresis -- no vitritis  - Discussed findings and prognosis  - No RT or RD on 360 exam  - Reviewed s/s of RT/RD  - Strict return precautions for any such RT/RD signs/symptoms    Ophthalmic Meds Ordered this visit:  No orders of the defined types were placed in this encounter.      Return in about 8 weeks (around 08/03/2017) for F/U displaced lens OD, Dilated Exam, OCT.  There are no Patient Instructions on file for this visit.   Explained the diagnoses, plan, and follow up with the patient and they expressed understanding.  Patient expressed understanding of the importance of proper follow up care.   Gardiner Sleeper, M.D., Ph.D. Vitreoretinal Surgeon [facility] 06/08/17     Abbreviations: M myopia (nearsighted); A astigmatism; H hyperopia (farsighted); P presbyopia; Mrx spectacle prescription;  CTL contact lenses; OD right eye; OS left eye; OU both eyes  XT exotropia; ET esotropia; PEK punctate epithelial keratitis; PEE punctate epithelial erosions; DES dry eye syndrome; MGD meibomian gland dysfunction; ATs artificial tears; PFAT's preservative free artificial tears; [place] nuclear sclerotic cataract; PSC posterior subcapsular cataract; ERM epi-retinal membrane; PVD posterior vitreous detachment; RD retinal detachment; DM diabetes mellitus; DR diabetic retinopathy; NPDR non-proliferative diabetic retinopathy; PDR proliferative  diabetic retinopathy; CSME clinically significant macular edema; DME diabetic macular edema; dbh dot blot hemorrhages; CWS cotton wool spot; POAG primary open angle glaucoma; C/D cup-to-disc ratio; HVF humphrey visual field; GVF goldmann visual field; OCT optical coherence tomography; IOP intraocular pressure; BRVO Branch retinal vein occlusion; CRVO central retinal vein occlusion; CRAO central retinal artery occlusion; BRAO branch retinal artery occlusion; RT retinal tear; SB scleral buckle; PPV pars plana vitrectomy; VH Vitreous hemorrhage; PRP panretinal laser photocoagulation; IVK intravitreal kenalog; VMT vitreomacular traction; MH Macular hole;  NVD neovascularization of the disc; NVE neovascularization elsewhere; AREDS age related eye disease study; ARMD age related macular degeneration; POAG primary open angle glaucoma; EBMD epithelial/anterior basement membrane dystrophy; ACIOL anterior chamber intraocular lens; IOL intraocular lens; PCIOL posterior chamber intraocular lens; Phaco/IOL phacoemulsification with intraocular lens placement; [place] photorefractive keratectomy; LASIK laser assisted in situ keratomileusis; HTN hypertension; DM diabetes mellitus; COPD chronic obstructive pulmonary disease

## 2017-06-08 ENCOUNTER — Ambulatory Visit (INDEPENDENT_AMBULATORY_CARE_PROVIDER_SITE_OTHER): Payer: Medicare Other | Admitting: Ophthalmology

## 2017-06-08 ENCOUNTER — Encounter (INDEPENDENT_AMBULATORY_CARE_PROVIDER_SITE_OTHER): Payer: Self-pay | Admitting: Ophthalmology

## 2017-06-08 DIAGNOSIS — H278 Other specified disorders of lens: Secondary | ICD-10-CM | POA: Diagnosis not present

## 2017-06-08 DIAGNOSIS — H32 Chorioretinal disorders in diseases classified elsewhere: Secondary | ICD-10-CM

## 2017-06-08 DIAGNOSIS — Z961 Presence of intraocular lens: Secondary | ICD-10-CM

## 2017-06-08 DIAGNOSIS — B399 Histoplasmosis, unspecified: Secondary | ICD-10-CM

## 2017-06-08 DIAGNOSIS — H43813 Vitreous degeneration, bilateral: Secondary | ICD-10-CM | POA: Diagnosis not present

## 2017-06-08 DIAGNOSIS — H31011 Macula scars of posterior pole (postinflammatory) (post-traumatic), right eye: Secondary | ICD-10-CM | POA: Diagnosis not present

## 2017-06-08 DIAGNOSIS — T8522XA Displacement of intraocular lens, initial encounter: Secondary | ICD-10-CM

## 2017-06-09 ENCOUNTER — Ambulatory Visit (HOSPITAL_COMMUNITY)
Admission: RE | Admit: 2017-06-09 | Discharge: 2017-06-09 | Disposition: A | Discharge status: Home / Self Care | Payer: Medicare Other | Source: Ambulatory Visit | Attending: Pulmonary Disease | Admitting: Pulmonary Disease

## 2017-06-09 DIAGNOSIS — M542 Cervicalgia: Secondary | ICD-10-CM | POA: Diagnosis not present

## 2017-06-09 DIAGNOSIS — M545 Low back pain: Secondary | ICD-10-CM | POA: Insufficient documentation

## 2017-06-09 DIAGNOSIS — Z981 Arthrodesis status: Secondary | ICD-10-CM | POA: Insufficient documentation

## 2017-06-09 DIAGNOSIS — M5136 Other intervertebral disc degeneration, lumbar region: Secondary | ICD-10-CM | POA: Diagnosis not present

## 2017-06-09 DIAGNOSIS — M5031 Other cervical disc degeneration,  high cervical region: Secondary | ICD-10-CM | POA: Insufficient documentation

## 2017-06-09 DIAGNOSIS — M48061 Spinal stenosis, lumbar region without neurogenic claudication: Secondary | ICD-10-CM | POA: Diagnosis not present

## 2017-06-09 DIAGNOSIS — M4802 Spinal stenosis, cervical region: Secondary | ICD-10-CM | POA: Insufficient documentation

## 2017-06-09 DIAGNOSIS — M50223 Other cervical disc displacement at C6-C7 level: Secondary | ICD-10-CM | POA: Diagnosis not present

## 2017-06-09 DIAGNOSIS — G039 Meningitis, unspecified: Secondary | ICD-10-CM | POA: Diagnosis not present

## 2017-06-10 ENCOUNTER — Ambulatory Visit (HOSPITAL_COMMUNITY): Payer: Medicare Other

## 2017-07-30 NOTE — Progress Notes (Deleted)
[facility] Note  08/03/2017     CHIEF COMPLAINT Patient presents for No chief complaint on file.   HISTORY OF PRESENT ILLNESS: [name] is a 77 y.o. male who presents to the clinic today for:     Referring physician: Sinda Du, MD [address] [place], [place] [address]  HISTORICAL INFORMATION:   Selected notes from the MEDICAL RECORD NUMBER Referral from Dr. 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 (Ophthalmic Drugs)   No current facility-administered medications for this visit.  (Ophthalmic Drugs)   Current Outpatient Medications (Other)  Medication Sig   acetaminophen (TYLENOL) 325 MG tablet Take 1-2 tablets (325-650 mg total) by mouth every 4 (four) hours as needed for mild pain.   aspirin EC 81 MG tablet Take 81 mg by mouth daily.   atorvastatin (LIPITOR) 40 MG tablet TAKE 1 TABLET BY MOUTH EVERY DAY   carvedilol (COREG) 25 MG tablet Take 1 tablet (25 mg total) by mouth 2 (two) times daily with a meal.   docusate sodium (COLACE) 100 MG capsule Take 100 mg by mouth daily.   doxazosin (CARDURA) 8 MG tablet Take 8 mg by mouth daily.   finasteride (PROSCAR) 5 MG tablet Take 5 mg by mouth daily.   HYDROcodone-acetaminophen (NORCO) 10-325 MG tablet TK 1 T PO QID   Multiple Vitamin (MULTIVITAMIN) tablet Take 1 tablet by mouth daily.   MYRBETRIQ 25 MG TB24 tablet TK 1 T PO QD   nitrofurantoin, macrocrystal-monohydrate, (MACROBID) 100 MG capsule TK 1 C PO BID FOR 7 DAYS   nitroGLYCERIN (NITROSTAT) 0.4 MG SL tablet Place 0.4 mg under the tongue every 5 (five) minutes as needed for chest pain. May take up to 3 doses per episode. Contact MD or 911 if  unresolved chest pain continues.   predniSONE (DELTASONE) 20 MG tablet Take 3 tablets (60 mg total) by mouth daily with breakfast.   tamsulosin (FLOMAX) 0.4 MG CAPS capsule Take 1 capsule (0.4 mg total) by mouth daily after breakfast.   No current facility-administered medications for this visit.  (Other)      REVIEW OF SYSTEMS:    ALLERGIES Allergies  Allergen Reactions   Morphine Other (See Comments)    REACTION: made him go crazy; "I had fun w/it; my family didn't think it was too funny"   Metoprolol Other (See Comments)    REACTION:  Tachycardia; "don't remember how bad it was; it was so long ago"   Rifampin     Itch     PAST MEDICAL HISTORY Past Medical History:  Diagnosis Date   Acute renal failure ([place]) 04/17/2013   Anemia, iron deficiency 09/09/2013   Anxiety    Arthritis    AVN (avascular necrosis of bone) ([place])    bilateral hips   Coronary artery disease    IN 2000   STENT PLACED IN 2012 sees Dr. Lattie Haw, saw last 2013   Disorder of blood    BEEN TREATED BY DERMATOLOGIST X 4 YRS..."BLOOD BLISTERS"   Gout    HTN (hypertension)    sees Dr. [name] in [place]   Hx MRSA infection    rt shoulder   Hyperlipidemia    Pneumonia    "I've had  it 3-4 times"   Small bowel obstruction ([facility])    Small bowel problem    HAD ALOT OF SCAR TISSUE FROM PREVIOUS SURGERIES..NG WAS INSERTED ...Marland KitchenMarland KitchenNO SURGERY NEEDED...Marland KitchenMarland KitchenIN FOR 8 DAYS   Ventral hernia    Past Surgical History:  Procedure Laterality Date   ANTERIOR CERVICAL CORPECTOMY  12/17/11   ANTERIOR CERVICAL CORPECTOMY  12/17/2011   Procedure: ANTERIOR CERVICAL CORPECTOMY;  Surgeon: Floyce Stakes, MD;  Location: [facility];  Service: Neurosurgery;  Laterality: N/A;  Anterior Cervical Decompression Fusion Five to Thoracic Two with plating   BACK SURGERY     lumbar   CATARACT EXTRACTION W/ INTRAOCULAR LENS  IMPLANT, BILATERAL  ? 2011   CHOLECYSTECTOMY  2006 "or after"   CORONARY ANGIOPLASTY  WITH STENT PLACEMENT  2012   CORONARY ARTERY BYPASS GRAFT  2000   CABG X5   EYE SURGERY     bilateral cataract   INCISION AND DRAINAGE OF WOUND  ~ 20ll; 12/03/11   "had infection in my right"   LUMBAR LAMINECTOMY/DECOMPRESSION MICRODISCECTOMY  06/13/2011   Procedure: LUMBAR LAMINECTOMY/DECOMPRESSION MICRODISCECTOMY;  Surgeon: Floyce Stakes;  Location: [facility];  Service: Neurosurgery;  Laterality: N/A;  Lumbar three, lumbar four-five Laminectomy   LUMBAR LAMINECTOMY/DECOMPRESSION MICRODISCECTOMY Right 06/17/2013   Procedure: LUMBAR LAMINECTOMY/DECOMPRESSION MICRODISCECTOMY 1 LEVEL;  Surgeon: Floyce Stakes, MD;  Location: [facility];  Service: Neurosurgery;  Laterality: Right;  Right L3-4 Microdiskectomy   LUMBAR WOUND DEBRIDEMENT N/A 08/02/2013   Procedure: INCISION AND DRAINAGE OF LUMBAR WOUND DEBRIDEMENT;  Surgeon: Floyce Stakes, MD;  Location: [facility];  Service: Neurosurgery;  Laterality: N/A;   PERIPHERALLY INSERTED CENTRAL CATHETER INSERTION  2011 & 11/2011   SHOULDER ARTHROSCOPY Left 09/13/2013   Procedure: ARTHROSCOPIC IRRIGATION AND DEBRIDEMENT, SYNOVECTOMY ,  LEFT SHOULDER ;  Surgeon: Yvette Rack., MD;  Location: [facility];  Service: Orthopedics;  Laterality: Left;   SHOULDER OPEN ROTATOR CUFF REPAIR  ~ 2011   right   STERNAL SURG  2000   HAS HAD 5-6 ON HIS STERNUM; "caught MRSA in it"   TONSILLECTOMY  1949    FAMILY HISTORY Family History  Problem Relation Age of Onset   Heart disease Unknown    Hypertension Mother    Hypertension Maternal Aunt    Hypertension Maternal Uncle    Anesthesia problems Neg Hx    Hypotension Neg Hx    Malignant hyperthermia Neg Hx    Pseudochol deficiency Neg Hx     SOCIAL HISTORY Social History   Tobacco Use   Smoking status: Former Smoker    Packs/day: 1.00    Years: 1.00    Pack years: 1.00    Types: Cigarettes    Start date: 12/29/1959    Last attempt to quit: 12/27/1998    Years since quitting: 18.6    Smokeless tobacco: Never Used  Substance Use Topics   Alcohol use: No    Alcohol/week: 0.0 oz   Drug use: No         OPHTHALMIC EXAM:   Not recorded      IMAGING AND PROCEDURES  Imaging and Procedures for 07/30/17           ASSESSMENT/PLAN:    ICD-10-CM   1. 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 Pseudophakia of both eyes Z96.1   4. Dislocated intraocular lens, initial  encounter T85.22XA   5. 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 Posterior vitreous detachment of both eyes H43.813     1,2. POHS OU with macular scar OD  - diagnosed ~35 yrs ago with history of macular laser OD  - large macular scar OD involving fovea -- reports history of low vision OD since receiving laser  - no active heme or CNVM noted today  - will monitor for now  3, 4, 5. Pseudophakia OU w/ subluxed IOL OD, pseudophakodonesis OS  - discussed findings and prognosis, and treatment options for dislocated IOL including observation, pars plana vitrectomy, lens explantation, lens implantation in either the anterior chamber or scleral fixated intraocular lens.   - Discussed risks of surgery including but not limited to bleeding, infection, corneal swelling, retinal tear, detachment, loss of vision, loss of eye.  - re-discussed likely limited visual benefit of surgery given extensive macular scar  - All questions answered for patient  - IOP okay and no UGH syndrome at this point  - pt wishes to observe for now given likely limited visual benefit and his medical comorbidities  - discussed s/s of complete IOL dislocation and possible need for surgery should IOL displace posteriorly  - advised limiting activities to prevent complete IOL dislocation OD  - will f/u in 8 weeks, sooner prn  6. PVD OU  - vitreous clear with debris likely from IOL dislocation in addition to PVD/vitreous syneresis -- no vitritis  -  Discussed findings and prognosis  - No RT or RD on 360 exam  - Reviewed s/s of RT/RD  - Strict return precautions for any such RT/RD signs/symptoms    Ophthalmic Meds Ordered this visit:  No orders of the defined types were placed in this encounter.      No Follow-up on file.  There are no Patient Instructions on file for this visit.   Explained the diagnoses, plan, and follow up with the patient and they expressed understanding.  Patient expressed understanding of the importance of proper follow up care.   This document serves as a record of services personally performed by Gardiner Sleeper, MD, PhD. It was created on their behalf by Catha Brow, [place], a certified ophthalmic assistant. The creation of this record is the provider's dictation and/or activities during the visit.  Electronically signed by: Catha Brow, [place]  07/30/17 2:54 PM    Gardiner Sleeper, M.D., Ph.D. Vitreoretinal Surgeon [facility] 07/30/17     Abbreviations: M myopia (nearsighted); A astigmatism; H hyperopia (farsighted); P presbyopia; Mrx spectacle prescription;  CTL contact lenses; OD right eye; OS left eye; OU both eyes  XT exotropia; ET esotropia; PEK punctate epithelial keratitis; PEE punctate epithelial erosions; DES dry eye syndrome; MGD meibomian gland dysfunction; ATs artificial tears; PFAT's preservative free artificial tears; [place] nuclear sclerotic cataract; PSC posterior subcapsular cataract; ERM epi-retinal membrane; PVD posterior vitreous detachment; RD retinal detachment; DM diabetes mellitus; DR diabetic retinopathy; NPDR non-proliferative diabetic retinopathy; PDR proliferative diabetic retinopathy; CSME clinically significant macular edema; DME diabetic macular edema; dbh dot blot hemorrhages; CWS cotton wool spot; POAG primary open angle glaucoma; C/D cup-to-disc ratio; HVF humphrey visual field; GVF goldmann visual field; OCT optical coherence tomography; IOP  intraocular pressure; BRVO Branch retinal vein occlusion; CRVO central retinal vein occlusion; CRAO central retinal artery occlusion; BRAO branch retinal artery occlusion; RT retinal tear; SB scleral buckle; PPV pars plana vitrectomy; VH Vitreous hemorrhage; PRP panretinal laser photocoagulation; IVK intravitreal kenalog; VMT vitreomacular traction; MH Macular  hole;  NVD neovascularization of the disc; NVE neovascularization elsewhere; AREDS age related eye disease study; ARMD age related macular degeneration; POAG primary open angle glaucoma; EBMD epithelial/anterior basement membrane dystrophy; ACIOL anterior chamber intraocular lens; IOL intraocular lens; PCIOL posterior chamber intraocular lens; Phaco/IOL phacoemulsification with intraocular lens placement; [place] photorefractive keratectomy; LASIK laser assisted in situ keratomileusis; HTN hypertension; DM diabetes mellitus; COPD chronic obstructive pulmonary disease

## 2017-07-31 DIAGNOSIS — N181 Chronic kidney disease, stage 1: Secondary | ICD-10-CM | POA: Diagnosis not present

## 2017-07-31 DIAGNOSIS — N319 Neuromuscular dysfunction of bladder, unspecified: Secondary | ICD-10-CM | POA: Diagnosis not present

## 2017-07-31 DIAGNOSIS — N138 Other obstructive and reflux uropathy: Secondary | ICD-10-CM | POA: Diagnosis not present

## 2017-07-31 DIAGNOSIS — N401 Enlarged prostate with lower urinary tract symptoms: Secondary | ICD-10-CM | POA: Diagnosis not present

## 2017-07-31 DIAGNOSIS — G834 Cauda equina syndrome: Secondary | ICD-10-CM | POA: Diagnosis not present

## 2017-08-03 ENCOUNTER — Encounter (INDEPENDENT_AMBULATORY_CARE_PROVIDER_SITE_OTHER): Payer: Medicare Other | Admitting: Ophthalmology

## 2017-08-10 NOTE — Progress Notes (Signed)
[facility] Note  08/11/2017     CHIEF COMPLAINT Patient presents for Retina Follow Up   HISTORY OF PRESENT ILLNESS: [name] is a 77 y.o. male who presents to the clinic today for:   HPI    Retina Follow Up    Patient presents with  Other.  In both eyes.  Severity is moderate.  Duration of 8 years.  Since onset it is stable.  I, the attending physician,  performed the HPI with the patient and updated documentation appropriately.          Comments    Pt presents today for F/U for POHS OU with mac scar OD and displaced IOL, pt states VA is stable, states he does see floaters, but denies flashes, pain or wavy vision, pt denies the use of gtts, pt is not diabetic,        Last edited by Bernarda Caffey, MD on 08/11/2017  9:59 AM. (History)      Referring physician: Sinda Du, MD [address], [place] [address]  HISTORICAL INFORMATION:   Selected notes from the MEDICAL RECORD NUMBER Referral from Dr. 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 (Ophthalmic Drugs)   No current facility-administered medications for this visit.  (Ophthalmic Drugs)   Current Outpatient Medications (Other)  Medication Sig  . acetaminophen (TYLENOL) 325 MG tablet Take 1-2 tablets (325-650 mg total) by mouth every 4 (four) hours as needed for mild pain.  Marland Kitchen aspirin EC 81 MG tablet Take 81 mg by mouth daily.  Marland Kitchen atorvastatin (LIPITOR) 40 MG tablet TAKE 1 TABLET BY MOUTH EVERY DAY  . carvedilol (COREG) 25 MG tablet Take 1 tablet (25 mg total) by mouth 2 (two) times daily with a meal.  . docusate sodium (COLACE) 100 MG capsule Take 100 mg by mouth daily.  Marland Kitchen doxazosin (CARDURA) 8 MG tablet  Take 8 mg by mouth daily.  . finasteride (PROSCAR) 5 MG tablet Take 5 mg by mouth daily.  Marland Kitchen HYDROcodone-acetaminophen (NORCO) 10-325 MG tablet TK 1 T PO QID  . Multiple Vitamin (MULTIVITAMIN) tablet Take 1 tablet by mouth daily.  Marland Kitchen MYRBETRIQ 25 MG TB24 tablet TK 1 T PO QD  . nitrofurantoin, macrocrystal-monohydrate, (MACROBID) 100 MG capsule TK 1 C PO BID FOR 7 DAYS  . nitroGLYCERIN (NITROSTAT) 0.4 MG SL tablet Place 0.4 mg under the tongue every 5 (five) minutes as needed for chest pain. May take up to 3 doses per episode. Contact MD or 911 if unresolved chest pain continues.  . predniSONE (DELTASONE) 20 MG tablet Take 3 tablets (60 mg total) by mouth daily with breakfast.  . tamsulosin (FLOMAX) 0.4 MG CAPS capsule Take 1 capsule (0.4 mg total) by mouth daily after breakfast.   No current facility-administered medications for this visit.  (Other)      REVIEW OF SYSTEMS: ROS    Positive for: Musculoskeletal, Eyes   Negative for: Constitutional, Gastrointestinal, Neurological, Skin, Genitourinary, HENT, Endocrine, Cardiovascular, Respiratory, Psychiatric, Allergic/Imm, Heme/Lymph   Last edited by Debbrah Alar, COT on 08/11/2017  9:24 AM. (History)       ALLERGIES Allergies  Allergen Reactions  . Morphine Other (See Comments)    REACTION: made him go  crazy; "I had fun w/it; my family didn't think it was too funny"  . Metoprolol Other (See Comments)    REACTION:  Tachycardia; "don't remember how bad it was; it was so long ago"  . Rifampin     Itch     PAST MEDICAL HISTORY Past Medical History:  Diagnosis Date  . Acute renal failure ([facility]) 04/17/2013  . Anemia, iron deficiency 09/09/2013  . Anxiety   . Arthritis   . 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 Coronary artery disease    IN 2000   STENT PLACED IN 2012 sees Dr. Lattie Haw, saw last 2013  . Disorder of blood    BEEN TREATED BY DERMATOLOGIST X 4 YRS..."BLOOD BLISTERS"  . Gout   . HTN  (hypertension)    se[place] Dr. [name] in [place]  . Hx MRSA infection    rt shoulder  . Hyperlipidemia   . Pneumonia    "I've had it 3-4 times"  . Small bowel obstruction ([facility])   . Small bowel problem    HAD ALOT OF SCAR TISSUE FROM PREVIOUS SURGERIES..NG WAS INSERTED ...Marland KitchenMarland KitchenNO SURGERY NEEDED...Marland KitchenMarland KitchenIN FOR 8 DAYS  . Ventral hernia    Past Surgical History:  Procedure Laterality Date  . ANTERIOR CERVICAL CORPECTOMY  12/17/11  . ANTERIOR CERVICAL CORPECTOMY  12/17/2011   Procedure: ANTERIOR CERVICAL CORPECTOMY;  Surgeon: Floyce Stakes, MD;  Location: [facility];  Service: Neurosurgery;  Laterality: N/A;  Anterior Cervical Decompression Fusion Five to Thoracic Two with plating  . BACK SURGERY     lumbar  . CATARACT EXTRACTION W/ INTRAOCULAR LENS  IMPLANT, BILATERAL  ? 2011  . CHOLECYSTECTOMY  2006 "or after"  . CORONARY ANGIOPLASTY WITH STENT PLACEMENT  2012  . CORONARY ARTERY BYPASS GRAFT  2000   CABG X5  . EYE SURGERY     bilateral cataract  . INCISION AND DRAINAGE OF WOUND  ~ 20ll; 12/03/11   "had infection in my right"  . LUMBAR LAMINECTOMY/DECOMPRESSION MICRODISCECTOMY  06/13/2011   Procedure: LUMBAR LAMINECTOMY/DECOMPRESSION MICRODISCECTOMY;  Surgeon: Floyce Stakes;  Location: [facility];  Service: Neurosurgery;  Laterality: N/A;  Lumbar three, lumbar four-five Laminectomy  . LUMBAR LAMINECTOMY/DECOMPRESSION MICRODISCECTOMY Right 06/17/2013   Procedure: LUMBAR LAMINECTOMY/DECOMPRESSION MICRODISCECTOMY 1 LEVEL;  Surgeon: Floyce Stakes, MD;  Location: [facility];  Service: Neurosurgery;  Laterality: Right;  Right L3-4 Microdiskectomy  . LUMBAR WOUND DEBRIDEMENT N/A 08/02/2013   Procedure: INCISION AND DRAINAGE OF LUMBAR WOUND DEBRIDEMENT;  Surgeon: Floyce Stakes, MD;  Location: [facility];  Service: Neurosurgery;  Laterality: N/A;  . [place] CATHETER INSERTION  2011 & 11/2011  . SHOULDER ARTHROSCOPY Left 09/13/2013   Procedure: ARTHROSCOPIC IRRIGATION  AND DEBRIDEMENT, SYNOVECTOMY ,  LEFT SHOULDER ;  Surgeon: Yvette Rack., MD;  Location: [facility];  Service: Orthopedics;  Laterality: Left;  . SHOULDER OPEN ROTATOR CUFF REPAIR  ~ 2011   right  . STERNAL SURG  2000   HAS HAD 5-6 ON HIS STERNUM; "caught MRSA in it"  . TONSILLECTOMY  1949    FAMILY HISTORY Family History  Problem Relation Age of Onset  . Heart disease Unknown   . Hypertension Mother   . Hypertension Maternal Aunt   . Hypertension Maternal Uncle   . Anesthesia problems Neg Hx   . Hypotension Neg Hx   . Malignant hyperthermia Neg Hx   . Pseudochol deficiency Neg Hx     SOCIAL HISTORY Social  History   Tobacco Use  . Smoking status: Former Smoker    Packs/day: 1.00    Years: 1.00    Pack years: 1.00    Types: Cigarettes    Start date: 12/29/1959    Last attempt to quit: 12/27/1998    Years since quitting: 18.6  . Smokeless tobacco: Never Used  Substance Use Topics  . Alcohol use: No    Alcohol/week: 0.0 oz  . Drug use: No         OPHTHALMIC EXAM:  Base Eye Exam    Visual Acuity (Snellen - Linear)      Right Left   Dist cc 20/80 -2 20/25   Dist ph cc NI NI   Correction:  Glasses  Pt states he cannot see anything out of his center vision OD       Tonometry (Tonopen, 9:33 AM)      Right Left   Pressure 11 11       Pupils      Dark Light Shape React APD   Right 3 2 Round Minimal None   Left 3 2 Round Minimal None       Visual Fields (Counting fingers)      Left Right    Full    Restrictions  Partial outer inferior nasal deficiency       Extraocular Movement      Right Left    Full, Ortho Full, Ortho       Neuro/Psych    Oriented x3:  Yes   Mood/Affect:  Normal       Dilation    Right eye:  1.0% Mydriacyl, 2.5% Phenylephrine @ 9:33 AM        Slit Lamp and Fundus Exam    External Exam      Right Left   External Brow ptosis Brow ptosis       Slit Lamp Exam      Right Left   Lids/Lashes Dermatochalasis - upper lid  Dermatochalasis - upper lid   Conjunctiva/Sclera White and quiet White and quiet   Cornea Arcus, 1+ Punctate epithelial erosions Arcus   Anterior Chamber Deep and quiet, no cell or flare Deep and quiet   Iris Round and moderate dilation to 5.7 mm; iridodenisis Round dilated to 7 mm; iridodenesis;    Lens Posterior chamber intraocular lens - displaced nasally 1/3 to 1/2 pupil, pseudophakodensis - stable Posterior chamber intraocular lens - in good postion; pseudophakodenesis; , Posterior capsular opacification - trace   Vitreous Vitreous syneresis, Posterior vitreous detachment; debris Vitreous syneresis       Fundus Exam      Right Left   Disc Peripapillary atrophy 360 deferred dilation -- no view   C/D Ratio 0.5 0.5   Macula Large chorioretinal scar abutting fovea; 2 small histospots - inferior macula; confluent patches of CR scarring inferiorly; no heme or CNVM    Vessels Normal    Periphery Attached; scattered pigmented CR scars 360           IMAGING AND PROCEDURES  Imaging and Procedures for 08/11/17  OCT, Retina - OU - Both Eyes     Right Eye Quality was good. Central Foveal Thickness: 325. Progression has been stable. Findings include abnormal foveal contour, outer retinal atrophy, no IRF, no SRF, outer retinal tubulation, epiretinal membrane, subretinal hyper-reflective material, intraretinal hyper-reflective material (IRHM; thin choroid;).   Left Eye Quality was good. Central Foveal Thickness: 304. Progression has been stable. Findings include normal foveal contour,  no IRF, no SRF, epiretinal membrane (Thin choroid; scattered outer retinal atrophy).   Notes Images taken, stored on drive  Diagnosis / Impression:  OD: larger macular scar with ERM, outer retinal atrophy, outer retinal tubulation OS: ERM, scattered outer retinal atrophy  Clinical management:  See below  Abbreviations: NFP - Normal foveal profile. CME - cystoid macular edema. PED - pigment epithelial  detachment. IRF - intraretinal fluid. SRF - subretinal fluid. EZ - ellipsoid zone. ERM - epiretinal membrane. ORA - outer retinal atrophy. ORT - outer retinal tubulation. SRHM - subretinal hyper-reflective material                  ASSESSMENT/PLAN:    ICD-10-CM   1. 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 Pseudophakia of both eyes Z96.1   4. 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 Posterior vitreous detachment of both eyes H43.813     1,2. POHS OU with macular scar OD  - diagnosed ~35 yrs ago with history of macular laser OD  - large macular scar OD involving fovea -- reports history of low vision OD since receiving laser  - stable today with no active heme or CNVM noted  - will continue to monitor  - f/u in 4 mos  3, 4, 5. Pseudophakia OU w/ subluxed IOL OD, pseudophakodonesis OS  - discussed findings and prognosis, and treatment options for dislocated IOL including observation, pars plana vitrectomy, lens explantation, lens implantation in either the anterior chamber or scleral fixated intraocular lens.   - Discussed risks of surgery including but not limited to bleeding, infection, corneal swelling, retinal tear, detachment, loss of vision, loss of eye.  - re-discussed likely limited visual benefit of surgery given extensive macular scar  - IOP okay and no UGH syndrome at this point  - pt wishes to observe for now given likely limited visual benefit and his medical comorbidities  - discussed s/s of complete IOL dislocation and possible need for surgery should IOL displace posteriorly  - advised limiting activities to prevent complete IOL dislocation OD  - will f/u in 4 mos, sooner prn  6. PVD OU  - vitreous clear with debris likely from IOL dislocation in addition to PVD/vitreous syneresis -- no vitritis  - Discussed findings and  prognosis  - No RT or RD on 360 exam  - Reviewed s/s of RT/RD  - Strict return precautions for any such RT/RD signs/symptoms    Ophthalmic Meds Ordered this visit:  No orders of the defined types were placed in this encounter.      Return in about 4 months (around 12/09/2017) for F/U displaced lens OD.  There are no Patient Instructions on file for this visit.   Explained the diagnoses, plan, and follow up with the patient and they expressed understanding.  Patient expressed understanding of the importance of proper follow up care.   This document serves as a record of services personally performed by Gardiner Sleeper, MD, PhD. It was created on their behalf by Catha Brow, [place], a certified ophthalmic assistant. The creation of this record is the provider's dictation and/or activities during the visit.  Electronically signed by: Catha Brow, COA  08/11/17 10:19 AM    Gardiner Sleeper, M.D., Ph.D. Vitreoretinal Surgeon [facility] 08/11/17   I have  reviewed the above documentation for accuracy and completeness, and I agree with the above. Gardiner Sleeper, M.D., Ph.D. 08/11/17 10:19 AM    Abbreviations: M myopia (nearsighted); A astigmatism; H hyperopia (farsighted); P presbyopia; Mrx spectacle prescription;  CTL contact lenses; OD right eye; OS left eye; OU both eyes  XT exotropia; ET esotropia; PEK punctate epithelial keratitis; PEE punctate epithelial erosions; DES dry eye syndrome; MGD meibomian gland dysfunction; ATs artificial tears; PFAT's preservative free artificial tears; [place] nuclear sclerotic cataract; PSC posterior subcapsular cataract; ERM epi-retinal membrane; PVD posterior vitreous detachment; RD retinal detachment; DM diabetes mellitus; DR diabetic retinopathy; NPDR non-proliferative diabetic retinopathy; PDR proliferative diabetic retinopathy; CSME clinically significant macular edema; DME diabetic macular edema; dbh dot blot hemorrhages;  CWS cotton wool spot; POAG primary open angle glaucoma; C/D cup-to-disc ratio; HVF humphrey visual field; GVF goldmann visual field; OCT optical coherence tomography; IOP intraocular pressure; BRVO Branch retinal vein occlusion; CRVO central retinal vein occlusion; CRAO central retinal artery occlusion; BRAO branch retinal artery occlusion; RT retinal tear; SB scleral buckle; PPV pars plana vitrectomy; VH Vitreous hemorrhage; PRP panretinal laser photocoagulation; IVK intravitreal kenalog; VMT vitreomacular traction; MH Macular hole;  NVD neovascularization of the disc; NVE neovascularization elsewhere; AREDS age related eye disease study; ARMD age related macular degeneration; POAG primary open angle glaucoma; EBMD epithelial/anterior basement membrane dystrophy; ACIOL anterior chamber intraocular lens; IOL intraocular lens; PCIOL posterior chamber intraocular lens; Phaco/IOL phacoemulsification with intraocular lens placement; [place] photorefractive keratectomy; LASIK laser assisted in situ keratomileusis; HTN hypertension; DM diabetes mellitus; COPD chronic obstructive pulmonary disease

## 2017-08-11 ENCOUNTER — Ambulatory Visit (INDEPENDENT_AMBULATORY_CARE_PROVIDER_SITE_OTHER): Payer: Medicare Other | Admitting: Ophthalmology

## 2017-08-11 ENCOUNTER — Encounter (INDEPENDENT_AMBULATORY_CARE_PROVIDER_SITE_OTHER): Payer: Self-pay | Admitting: Ophthalmology

## 2017-08-11 DIAGNOSIS — H32 Chorioretinal disorders in diseases classified elsewhere: Secondary | ICD-10-CM

## 2017-08-11 DIAGNOSIS — B399 Histoplasmosis, unspecified: Secondary | ICD-10-CM

## 2017-08-11 DIAGNOSIS — T8522XA Displacement of intraocular lens, initial encounter: Secondary | ICD-10-CM

## 2017-08-11 DIAGNOSIS — H43813 Vitreous degeneration, bilateral: Secondary | ICD-10-CM

## 2017-08-11 DIAGNOSIS — Z961 Presence of intraocular lens: Secondary | ICD-10-CM | POA: Diagnosis not present

## 2017-08-11 DIAGNOSIS — H31011 Macula scars of posterior pole (postinflammatory) (post-traumatic), right eye: Secondary | ICD-10-CM | POA: Diagnosis not present

## 2017-08-11 DIAGNOSIS — H278 Other specified disorders of lens: Secondary | ICD-10-CM

## 2017-08-13 DIAGNOSIS — Z79891 Long term (current) use of opiate analgesic: Secondary | ICD-10-CM | POA: Diagnosis not present

## 2017-08-27 DIAGNOSIS — N401 Enlarged prostate with lower urinary tract symptoms: Secondary | ICD-10-CM | POA: Diagnosis not present

## 2017-08-27 DIAGNOSIS — N181 Chronic kidney disease, stage 1: Secondary | ICD-10-CM | POA: Diagnosis not present

## 2017-08-27 DIAGNOSIS — N261 Atrophy of kidney (terminal): Secondary | ICD-10-CM | POA: Diagnosis not present

## 2017-08-27 DIAGNOSIS — N3289 Other specified disorders of bladder: Secondary | ICD-10-CM | POA: Diagnosis not present

## 2017-08-27 DIAGNOSIS — R8290 Unspecified abnormal findings in urine: Secondary | ICD-10-CM | POA: Diagnosis not present

## 2017-08-27 DIAGNOSIS — N138 Other obstructive and reflux uropathy: Secondary | ICD-10-CM | POA: Diagnosis not present

## 2017-08-27 DIAGNOSIS — G834 Cauda equina syndrome: Secondary | ICD-10-CM | POA: Diagnosis not present

## 2017-09-08 DIAGNOSIS — R079 Chest pain, unspecified: Secondary | ICD-10-CM | POA: Diagnosis not present

## 2017-09-08 DIAGNOSIS — M542 Cervicalgia: Secondary | ICD-10-CM | POA: Diagnosis not present

## 2017-09-08 DIAGNOSIS — I10 Essential (primary) hypertension: Secondary | ICD-10-CM | POA: Diagnosis not present

## 2017-09-08 DIAGNOSIS — M545 Low back pain: Secondary | ICD-10-CM | POA: Diagnosis not present

## 2017-09-15 DIAGNOSIS — Z79891 Long term (current) use of opiate analgesic: Secondary | ICD-10-CM | POA: Diagnosis not present

## 2017-09-17 DIAGNOSIS — N319 Neuromuscular dysfunction of bladder, unspecified: Secondary | ICD-10-CM | POA: Diagnosis not present

## 2017-09-17 DIAGNOSIS — R42 Dizziness and giddiness: Secondary | ICD-10-CM | POA: Diagnosis not present

## 2017-09-18 DIAGNOSIS — Z981 Arthrodesis status: Secondary | ICD-10-CM | POA: Diagnosis not present

## 2017-09-18 DIAGNOSIS — M4722 Other spondylosis with radiculopathy, cervical region: Secondary | ICD-10-CM | POA: Diagnosis not present

## 2017-09-18 DIAGNOSIS — M4712 Other spondylosis with myelopathy, cervical region: Secondary | ICD-10-CM | POA: Diagnosis not present

## 2017-09-18 DIAGNOSIS — M545 Low back pain: Secondary | ICD-10-CM | POA: Diagnosis not present

## 2017-09-25 DIAGNOSIS — N181 Chronic kidney disease, stage 1: Secondary | ICD-10-CM | POA: Diagnosis not present

## 2017-09-25 DIAGNOSIS — N138 Other obstructive and reflux uropathy: Secondary | ICD-10-CM | POA: Diagnosis not present

## 2017-09-25 DIAGNOSIS — N401 Enlarged prostate with lower urinary tract symptoms: Secondary | ICD-10-CM | POA: Diagnosis not present

## 2017-09-25 DIAGNOSIS — G834 Cauda equina syndrome: Secondary | ICD-10-CM | POA: Diagnosis not present

## 2017-09-25 DIAGNOSIS — N319 Neuromuscular dysfunction of bladder, unspecified: Secondary | ICD-10-CM | POA: Diagnosis not present

## 2017-11-26 DIAGNOSIS — G834 Cauda equina syndrome: Secondary | ICD-10-CM | POA: Diagnosis not present

## 2017-11-26 DIAGNOSIS — M545 Low back pain: Secondary | ICD-10-CM | POA: Diagnosis not present

## 2017-11-26 DIAGNOSIS — I10 Essential (primary) hypertension: Secondary | ICD-10-CM | POA: Diagnosis not present

## 2017-11-26 DIAGNOSIS — I5032 Chronic diastolic (congestive) heart failure: Secondary | ICD-10-CM | POA: Diagnosis not present

## 2017-11-26 DIAGNOSIS — Z0189 Encounter for other specified special examinations: Secondary | ICD-10-CM | POA: Diagnosis not present

## 2017-11-30 DIAGNOSIS — M7021 Olecranon bursitis, right elbow: Secondary | ICD-10-CM | POA: Diagnosis not present

## 2017-12-09 ENCOUNTER — Encounter (INDEPENDENT_AMBULATORY_CARE_PROVIDER_SITE_OTHER): Payer: Medicare Other | Admitting: Ophthalmology

## 2017-12-11 DIAGNOSIS — M545 Low back pain: Secondary | ICD-10-CM | POA: Diagnosis not present

## 2017-12-11 DIAGNOSIS — M419 Scoliosis, unspecified: Secondary | ICD-10-CM | POA: Diagnosis not present

## 2017-12-11 DIAGNOSIS — G834 Cauda equina syndrome: Secondary | ICD-10-CM | POA: Diagnosis not present

## 2017-12-11 DIAGNOSIS — I10 Essential (primary) hypertension: Secondary | ICD-10-CM | POA: Diagnosis not present

## 2018-02-24 ENCOUNTER — Encounter: Payer: Self-pay | Admitting: *Deleted

## 2018-02-24 ENCOUNTER — Ambulatory Visit (INDEPENDENT_AMBULATORY_CARE_PROVIDER_SITE_OTHER): Payer: Medicare Other | Admitting: Cardiovascular Disease

## 2018-02-24 VITALS — BP 118/58 | HR 66 | Ht 72.0 in | Wt 170.6 lb

## 2018-02-24 DIAGNOSIS — I10 Essential (primary) hypertension: Secondary | ICD-10-CM | POA: Diagnosis not present

## 2018-02-24 DIAGNOSIS — I25708 Atherosclerosis of coronary artery bypass graft(s), unspecified, with other forms of angina pectoris: Secondary | ICD-10-CM

## 2018-02-24 DIAGNOSIS — E785 Hyperlipidemia, unspecified: Secondary | ICD-10-CM | POA: Diagnosis not present

## 2018-02-24 DIAGNOSIS — I5032 Chronic diastolic (congestive) heart failure: Secondary | ICD-10-CM

## 2018-02-24 NOTE — Progress Notes (Signed)
SUBJECTIVE: The patient presents for follow-up of coronary artery disease with a history of CABG and chronic diastolic heart failure.  Most recent echocardiogram performed on 02/10/2017 showed normal left ventricular systolic function, LVEF 50 to 16%, mild LVH, grade indeterminate diastolic dysfunction with high ventricular filling pressures, regional wall variation, mild mitral and tricuspid regurgitation, and mild aortic root dilatation (3.9 cm).  ECG performed in the office today which I ordered and personally interpreted demonstrates sinus bradycardia, 59 bpm.  There were no significant ischemic ST segment or T wave abnormalities.  The patient denies any symptoms of chest pain, palpitations, shortness of breath, lightheadedness, dizziness, leg swelling, orthopnea, PND, and syncope.  He tries to stay active and walks on the treadmill 3 days/week and lifts weights.  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  He uses a walker to ambulate.  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      Review of Systems: As per "subjective", otherwise negative.  Allergies  Allergen Reactions  . Morphine Other (See Comments)    REACTION: made him go crazy; "I had fun w/it; my family didn't think it was too funny"  . Metoprolol Other (See Comments)    REACTION:  Tachycardia; "don't remember how bad it was; it was so long ago"  . Rifampin     Itch     Current Outpatient Medications  Medication Sig Dispense Refill  . acetaminophen (TYLENOL) 325 MG tablet Take 1-2 tablets (325-650 mg total) by mouth every 4 (four) hours as needed for mild pain.    Marland Kitchen aspirin EC 81 MG tablet Take 81 mg by mouth daily.    Marland Kitchen atorvastatin (LIPITOR) 40 MG tablet TAKE 1 TABLET BY MOUTH EVERY DAY 30 tablet 6  . carvedilol (COREG) 25 MG tablet Take 1 tablet (25 mg total) by mouth 2 (two) times daily with a meal. 60 tablet 12  .  docusate sodium (COLACE) 100 MG capsule Take 100 mg by mouth daily.    Marland Kitchen doxazosin (CARDURA) 8 MG tablet Take 8 mg by mouth daily.    . finasteride (PROSCAR) 5 MG tablet Take 5 mg by mouth daily.    Marland Kitchen HYDROcodone-acetaminophen (NORCO) 10-325 MG tablet TK 1 T PO QID  0  . Multiple Vitamin (MULTIVITAMIN) tablet Take 1 tablet by mouth daily.    Marland Kitchen MYRBETRIQ 25 MG TB24 tablet TK 1 T PO QD  11  . nitroGLYCERIN (NITROSTAT) 0.4 MG SL tablet Place 0.4 mg under the tongue every 5 (five) minutes as needed for chest pain. May take up to 3 doses per episode. Contact MD or 911 if unresolved chest pain continues.    . predniSONE (DELTASONE) 10 MG tablet Take 5-10 mg by mouth as directed.  1  . tamsulosin (FLOMAX) 0.4 MG CAPS capsule Take 1 capsule (0.4 mg total) by mouth daily after breakfast. 30 capsule 1   No current facility-administered medications for this visit.     Past Medical History:  Diagnosis Date  . Acute renal failure ([facility]) 04/17/2013  . Anemia, iron deficiency 09/09/2013  . Anxiety   . Arthritis   . 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 Coronary artery disease    IN 2000   STENT PLACED IN 2012 sees Dr. Dietrich Pates, saw last 2013  . Disorder of blood    BEEN TREATED BY DERMATOLOGIST X 4  YRS..."BLOOD BLISTERS"  . Gout   . HTN (hypertension)    sees [name] in [place]  . Hx MRSA infection    rt shoulder  . Hyperlipidemia   . Pneumonia    "I've had it 3-4 times"  . Small bowel obstruction ([facility])   . Small bowel problem    HAD ALOT OF SCAR TISSUE FROM PREVIOUS SURGERIES..NG WAS INSERTED ...Marland KitchenMarland KitchenNO SURGERY NEEDED...Marland KitchenMarland KitchenIN FOR 8 DAYS  . Ventral hernia     Past Surgical History:  Procedure Laterality Date  . ANTERIOR CERVICAL CORPECTOMY  12/17/11  . ANTERIOR CERVICAL CORPECTOMY  12/17/2011   Procedure: ANTERIOR CERVICAL CORPECTOMY;  Surgeon: Karn Cassis, MD;  Location: [facility];  Service: Neurosurgery;  Laterality: N/A;  Anterior Cervical Decompression  Fusion Five to Thoracic Two with plating  . BACK SURGERY     lumbar  . CATARACT EXTRACTION W/ INTRAOCULAR LENS  IMPLANT, BILATERAL  ? 2011  . CHOLECYSTECTOMY  2006 "or after"  . CORONARY ANGIOPLASTY WITH STENT PLACEMENT  2012  . CORONARY ARTERY BYPASS GRAFT  2000   CABG X5  . EYE SURGERY     bilateral cataract  . INCISION AND DRAINAGE OF WOUND  ~ 20ll; 12/03/11   "had infection in my right"  . LUMBAR LAMINECTOMY/DECOMPRESSION MICRODISCECTOMY  06/13/2011   Procedure: LUMBAR LAMINECTOMY/DECOMPRESSION MICRODISCECTOMY;  Surgeon: Karn Cassis;  Location: [facility];  Service: Neurosurgery;  Laterality: N/A;  Lumbar three, lumbar four-five Laminectomy  . LUMBAR LAMINECTOMY/DECOMPRESSION MICRODISCECTOMY Right 06/17/2013   Procedure: LUMBAR LAMINECTOMY/DECOMPRESSION MICRODISCECTOMY 1 LEVEL;  Surgeon: Karn Cassis, MD;  Location: [facility];  Service: Neurosurgery;  Laterality: Right;  Right L3-4 Microdiskectomy  . LUMBAR WOUND DEBRIDEMENT N/A 08/02/2013   Procedure: INCISION AND DRAINAGE OF LUMBAR WOUND DEBRIDEMENT;  Surgeon: Karn Cassis, MD;  Location: [facility];  Service: Neurosurgery;  Laterality: N/A;  . PERIPHERALLY INSERTED CENTRAL CATHETER INSERTION  2011 & 11/2011  . SHOULDER ARTHROSCOPY Left 09/13/2013   Procedure: ARTHROSCOPIC IRRIGATION AND DEBRIDEMENT, SYNOVECTOMY ,  LEFT SHOULDER ;  Surgeon: Thera Flake., MD;  Location: [facility];  Service: Orthopedics;  Laterality: Left;  . SHOULDER OPEN ROTATOR CUFF REPAIR  ~ 2011   right  . STERNAL SURG  2000   HAS HAD 5-6 ON HIS STERNUM; "caught MRSA in it"  . TONSILLECTOMY  1949    Social History   Socioeconomic History  . Marital status: Married    Spouse name: Not on file  . Number of children: Not on file  . Years of education: Not on file  . Highest education level: Not on file  Occupational History  . Not on file  Social Needs  . Financial resource strain: Not on file  . Food insecurity:    Worry: Not on file     Inability: Not on file  . Transportation needs:    Medical: Not on file    Non-medical: Not on file  Tobacco Use  . Smoking status: Former Smoker    Packs/day: 1.00    Years: 1.00    Pack years: 1.00    Types: Cigarettes    Start date: 12/29/1959    Last attempt to quit: 12/27/1998    Years since quitting: 19.1  . Smokeless tobacco: Never Used  Substance and Sexual Activity  . Alcohol use: No    Alcohol/week: 0.0 oz  . Drug use: No  . Sexual activity: Not Currently  Lifestyle  . Physical activity:    Days  per week: Not on file    Minutes per session: Not on file  . Stress: Not on file  Relationships  . Social connections:    Talks on phone: Not on file    Gets together: Not on file    Attends religious service: Not on file    Active member of club or organization: Not on file    Attends meetings of clubs or organizations: Not on file    Relationship status: Not on file  . Intimate partner violence:    Fear of current or ex partner: Not on file    Emotionally abused: Not on file    Physically abused: Not on file    Forced sexual activity: Not on file  Other Topics Concern  . Not on file  Social History Narrative  . Not on file     Vitals:   02/24/18 1111  BP: (!) 118/58  Pulse: 66  SpO2: 98%  Weight: 170 lb 9.6 oz (77.4 kg)  Height: 6' (1.829 m)    Wt Readings from Last 3 Encounters:  02/24/18 170 lb 9.6 oz (77.4 kg)  02/20/17 169 lb (76.7 kg)  02/05/17 168 lb (76.2 kg)     PHYSICAL EXAM General: NAD HEENT: Normal. Neck: No JVD, no thyromegaly. Lungs: Clear to auscultation bilaterally with normal respiratory effort. CV: Regular rate and rhythm, normal S1/S2, no S3/S4, no murmur. No pretibial or periankle edema.  No carotid bruit.   Abdomen: Soft, nontender, no distention.  Neurologic: Alert and oriented.  Psych: Normal affect. Skin: Normal. Musculoskeletal: Left leg in brace.    ECG: Reviewed above under Subjective   Labs: Lab Results    Component Value Date/Time   K 4.8 01/20/2017 04:30 PM   BUN 28 (H) 01/20/2017 04:30 PM   CREATININE 1.29 (H) 01/20/2017 04:30 PM   ALT 11 09/05/2013 12:52 PM   TSH 2.362 09/07/2013 03:47 PM   HGB 13.4 01/20/2017 04:30 PM     Lipids: Lab Results  Component Value Date/Time   LDLCALC 52 01/20/2017 04:30 PM   CHOL 117 01/20/2017 04:30 PM   TRIG 89 01/20/2017 04:30 PM   HDL 47 01/20/2017 04:30 PM       ASSESSMENT AND PLAN: 1.  Coronary disease with a history of CABG: Normal nuclear stress test on 02/13/2017.  Left ventricular systolic function is normal.  Symptomatically stable.  Continue aspirin, Lipitor, and carvedilol.  2.  Chronic diastolic heart failure: LVEF 50 to 55%.  Appears compensated with no need for a diuretic.  Blood pressure is normal.  3.  Hypertension: Blood pressure is normal.  No changes to therapy.  4.  Hyperlipidemia: LDL 52 on 01/20/2017.  I will check lipids.  Continue statin therapy.   Disposition: Follow up 1 year   Prentice DockerSuresh Alexy Bringle, M.D., F.A.C.C.

## 2018-02-24 NOTE — Patient Instructions (Addendum)
Your physician wants you to follow-up in: 1 year   with Dr.Koneswaran You will receive a reminder letter in the mail two months in advance. If you don't receive a letter, please call our office to schedule the follow-up appointment.     Please get Fasting lipids lab work     Your physician recommends that you continue on your current medications as directed. Please refer to the Current Medication list given to you today.     If you need a refill on your cardiac medications before your next appointment, please call your pharmacy.     No tests today       Thank you for choosing [facility] !

## 2018-02-26 ENCOUNTER — Telehealth: Payer: Self-pay | Admitting: *Deleted

## 2018-02-26 DIAGNOSIS — I2510 Atherosclerotic heart disease of native coronary artery without angina pectoris: Secondary | ICD-10-CM | POA: Diagnosis not present

## 2018-02-26 LAB — LIPID PANEL
CHOL/HDL RATIO: 2.8 (calc) (ref <5.0)
Cholesterol: 143 mg/dL (ref <200)
HDL: 51 mg/dL (ref >40)
LDL Cholesterol (Calc): 69 mg/dL (calc)
NON-HDL CHOLESTEROL (CALC): 92 mg/dL (ref <130)
Triglycerides: 152 mg/dL — ABNORMAL HIGH (ref <150)

## 2018-02-26 NOTE — Telephone Encounter (Signed)
-----   Message from Laqueta LindenSuresh A Koneswaran, MD sent at 02/26/2018  2:49 PM EDT ----- Peri JeffersonGood

## 2018-02-26 NOTE — Telephone Encounter (Signed)
Called pt. No answer. Unable to leave voicemail.

## 2018-03-22 DIAGNOSIS — I5032 Chronic diastolic (congestive) heart failure: Secondary | ICD-10-CM | POA: Diagnosis not present

## 2018-03-22 DIAGNOSIS — R339 Retention of urine, unspecified: Secondary | ICD-10-CM | POA: Diagnosis not present

## 2018-03-22 DIAGNOSIS — M545 Low back pain: Secondary | ICD-10-CM | POA: Diagnosis not present

## 2018-03-22 DIAGNOSIS — G834 Cauda equina syndrome: Secondary | ICD-10-CM | POA: Diagnosis not present

## 2018-03-26 DIAGNOSIS — R3915 Urgency of urination: Secondary | ICD-10-CM | POA: Diagnosis not present

## 2018-03-26 DIAGNOSIS — G834 Cauda equina syndrome: Secondary | ICD-10-CM | POA: Diagnosis not present

## 2018-03-26 DIAGNOSIS — N3000 Acute cystitis without hematuria: Secondary | ICD-10-CM | POA: Diagnosis not present

## 2018-03-26 DIAGNOSIS — N319 Neuromuscular dysfunction of bladder, unspecified: Secondary | ICD-10-CM | POA: Diagnosis not present

## 2018-03-26 DIAGNOSIS — N312 Flaccid neuropathic bladder, not elsewhere classified: Secondary | ICD-10-CM | POA: Diagnosis not present

## 2018-03-26 DIAGNOSIS — N181 Chronic kidney disease, stage 1: Secondary | ICD-10-CM | POA: Diagnosis not present

## 2018-03-26 DIAGNOSIS — N401 Enlarged prostate with lower urinary tract symptoms: Secondary | ICD-10-CM | POA: Diagnosis not present

## 2018-03-26 DIAGNOSIS — N138 Other obstructive and reflux uropathy: Secondary | ICD-10-CM | POA: Diagnosis not present

## 2018-04-15 ENCOUNTER — Other Ambulatory Visit: Payer: Self-pay

## 2018-04-15 ENCOUNTER — Ambulatory Visit (HOSPITAL_COMMUNITY): Payer: Medicare Other | Attending: Pulmonary Disease

## 2018-04-15 ENCOUNTER — Encounter (HOSPITAL_COMMUNITY): Payer: Self-pay

## 2018-04-15 DIAGNOSIS — R2681 Unsteadiness on feet: Secondary | ICD-10-CM | POA: Diagnosis not present

## 2018-04-15 DIAGNOSIS — R279 Unspecified lack of coordination: Secondary | ICD-10-CM

## 2018-04-15 DIAGNOSIS — R262 Difficulty in walking, not elsewhere classified: Secondary | ICD-10-CM | POA: Insufficient documentation

## 2018-04-15 DIAGNOSIS — R208 Other disturbances of skin sensation: Secondary | ICD-10-CM

## 2018-04-15 NOTE — Therapy (Signed)
[facility] [address] [place], [place], [address] Phone: [phone]   Fax:  [phone]  Physical Therapy Evaluation  Patient Details  Name: [name] MRN: [id number] Date of Birth: 11/06/1940 Referring Provider: [name], MD   Encounter Date: 04/15/2018  PT End of Session - 04/15/18 0949    Visit Number  1    Number of Visits  9    Date for PT Re-Evaluation  05/13/18    Authorization Type  Medicare (Secondary: [employer] [facility])    Authorization Time Period  04/15/18 to 05/13/18    Authorization - Visit Number  1    Authorization - Number of Visits  10    PT Start Time  0856    PT Stop Time  0936    PT Time Calculation (min)  40 min    Activity Tolerance  Patient tolerated treatment well    Behavior During Therapy  [facility] for tasks assessed/performed       Past Medical History:  Diagnosis Date  . Acute renal failure (HCC) 04/17/2013  . Anemia, iron deficiency 09/09/2013  . Anxiety   . Arthritis   . 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 Coronary artery disease    IN 2000   STENT PLACED IN 2012 sees Dr. Dietrich Pates, saw last 2013  . Disorder of blood    BEEN TREATED BY DERMATOLOGIST X 4 YRS..."BLOOD BLISTERS"  . Gout   . HTN (hypertension)    sees [name] in [place]  . Hx MRSA infection    rt shoulder  . Hyperlipidemia   . Pneumonia    "I've had it 3-4 times"  . Small bowel obstruction (HCC)   . Small bowel problem    HAD ALOT OF SCAR TISSUE FROM PREVIOUS SURGERIES..NG WAS INSERTED ...Marland KitchenMarland KitchenNO SURGERY NEEDED...Marland KitchenMarland KitchenIN FOR 8 DAYS  . Ventral hernia     Past Surgical History:  Procedure Laterality Date  . ANTERIOR CERVICAL CORPECTOMY  12/17/11  . ANTERIOR CERVICAL CORPECTOMY  12/17/2011   Procedure: ANTERIOR CERVICAL CORPECTOMY;  Surgeon: Karn Cassis, MD;  Location: [facility];  Service: Neurosurgery;  Laterality: N/A;  Anterior Cervical Decompression Fusion Five to Thoracic Two  with plating  . BACK SURGERY     lumbar  . CATARACT EXTRACTION W/ INTRAOCULAR LENS  IMPLANT, BILATERAL  ? 2011  . CHOLECYSTECTOMY  2006 "or after"  . CORONARY ANGIOPLASTY WITH STENT PLACEMENT  2012  . CORONARY ARTERY BYPASS GRAFT  2000   CABG X5  . EYE SURGERY     bilateral cataract  . INCISION AND DRAINAGE OF WOUND  ~ 20ll; 12/03/11   "had infection in my right"  . LUMBAR LAMINECTOMY/DECOMPRESSION MICRODISCECTOMY  06/13/2011   Procedure: LUMBAR LAMINECTOMY/DECOMPRESSION MICRODISCECTOMY;  Surgeon: Karn Cassis;  Location: [facility];  Service: Neurosurgery;  Laterality: N/A;  Lumbar three, lumbar four-five Laminectomy  . LUMBAR LAMINECTOMY/DECOMPRESSION MICRODISCECTOMY Right 06/17/2013   Procedure: LUMBAR LAMINECTOMY/DECOMPRESSION MICRODISCECTOMY 1 LEVEL;  Surgeon: Karn Cassis, MD;  Location: [facility];  Service: Neurosurgery;  Laterality: Right;  Right L3-4 Microdiskectomy  . LUMBAR WOUND DEBRIDEMENT N/A 08/02/2013   Procedure: INCISION AND DRAINAGE OF LUMBAR WOUND DEBRIDEMENT;  Surgeon: Karn Cassis, MD;  Location: [facility];  Service: Neurosurgery;  Laterality: N/A;  . PERIPHERALLY INSERTED CENTRAL CATHETER INSERTION  2011 & 11/2011  . SHOULDER ARTHROSCOPY Left 09/13/2013   Procedure: ARTHROSCOPIC IRRIGATION AND DEBRIDEMENT, SYNOVECTOMY ,  LEFT  SHOULDER ;  Surgeon: Thera Flake., MD;  Location: [facility];  Service: Orthopedics;  Laterality: Left;  . SHOULDER OPEN ROTATOR CUFF REPAIR  ~ 2011   right  . STERNAL SURG  2000   HAS HAD 5-6 ON HIS STERNUM; "caught MRSA in it"  . TONSILLECTOMY  1949    There were no vitals filed for this visit.   Subjective Assessment - 04/15/18 0900    Subjective  Pt reports that his R foot is losing its feeling in it. He states that he doesn't feel as safe driving with it. He is still working out 3x/week but he states that it is bothering. He has h/o 3 lumbar surgeries with the most recent being in 2016. He still has LBP across both sides,  with L side being worse than R. He states the L leg tingles all the time but his R leg does not have any radicular symptoms, it is just his R foot tingling. He reports that he cant feel the gas or brake pedal as well. He is losing his feeling in his big toe but the lateral portion of his foot isn't numb. His numbness is the entire medial foot to his heel but no n/t into his ankle or up his lower leg. This numbness is a constant thing, nothing increases or decreases it. He noticed this numbness a couple months ago and it has not worsened or improved since.     How long can you walk comfortably?  doesn't notice the tingling too much when walking    Patient Stated Goals  try and decrease the numbness    Currently in Pain?  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   No    Is the patient reluctant to leave their home because of a fear of falling?   No      Prior Function   Level of Independence  Independent;Requires assistive device for independence    Vocation  Retired    Leisure  help son at [employer]      Observation/Other [employer] on Therapeutic Outcomes (FOTO)   51% limitation      Sensation   Light Touch  Impaired Detail    Light Touch Impaired Details  Absent LLE    L4-S1 absent   Proprioception  Impaired Detail    Proprioception Impaired Details  Impaired RLE   great toe max  impairments; R ankle normal, not impaired   Additional Comments  Sharp/dull impaired maximally on plantar aspect of foot (got <25% correct);   DTRs RLE (knee jerk, ankle jerk): absent      ROM / Strength   AROM / PROM / Strength  Strength      Strength   Strength Assessment Site  Hip;Knee;Ankle    Right Hip Flexion  5/5    Right Hip ABduction  4+/5   sitting   Left Hip Flexion  4+/5    Left Hip ABduction  4/5   sitting   Right Knee Flexion  4+/5   sitting   Right Knee Extension  5/5    Left Knee Flexion  3+/5   sitting   Left Knee Extension  4+/5    Right Ankle Dorsiflexion  3+/5    Left Ankle Dorsiflexion  0/5      Ambulation/Gait   Ambulation Distance (Feet)  620 Feet     Assistive device  4-wheeled walker   KAFO LLE   Gait Pattern  Step-through pattern;Decreased dorsiflexion - right;Ataxic;Trendelenburg;Lateral trunk lean to right;Narrow base of support;Trunk flexed   mildly ataxic as he intermittently had improper foot placeme     Balance   Balance Assessed  Yes      Static Standing Balance   Static Standing - Balance Support  Left upper extremity supported    Static Standing Balance -  Activities   Single Leg Stance - Right Leg;Single Leg Stance - Left Leg    Static Standing - Comment/# of Minutes  R: 40sec, L:15sec, BUE           Objective measurements completed on examination: See above findings.         PT Education - 04/15/18 0948    Education Details  exam findings, POC, will initiate HEP next visit    Person(s) Educated  Patient    Methods  Explanation    Comprehension  Verbalized understanding       PT Short Term Goals - 04/15/18 1123      PT SHORT TERM GOAL #1   Title  Pt will demo improved proprioception of R great toe AEB ability to properly identify great toe movement with EC 50% of the time or better in order to  maximize his driving ability.    Time  2    Period  Weeks    Status  New    Target Date  04/29/18      PT SHORT TERM GOAL #2   Title  Pt will have 1/2 grade improvement in MMT in order to maximize his balance and gait.    Time  2    Period  Weeks    Status  New        PT Long Term Goals - 04/15/18 1124      PT LONG TERM GOAL #1   Title  Pt will demo improved sensation of R foot AEB ability to properly identify sharp/dull sensation 50% of the time or better in order to maximize his balance.    Time  4    Period  Weeks    Status  New    Target Date  05/13/18      PT LONG TERM GOAL #2   Title  Pt will have subjective reports of being able to accurately place his foot on his gas and brake pedal 75% of the time or better to demo improved proprioception and coordination of his R foot.  Time  4    Period  Weeks    Status  New      PT LONG TERM GOAL #3   Title  Pt will have improved FOTO score by 10% or better in order to demo improved overall function with his R foot.     Time  4    Period  Weeks    Status  New             Plan - 04/15/18 1027    Clinical Impression Statement  Pt is pleasant 77YO M who presents to OPPT with c/o R foot numbness which started insidiously a couple of months ago. Pt has chronic history with low back pain, lumbar surgeries, requires KAFO on LLE due to weakness, rollator for ambulation at baseline, and has cauda equina syndrome which his MDs are aware of and following (pt not going to seek surgical treatment for cauda equina). Pt's sensation, proprioception, and coordination impaired on R foot AEB difficulty identifying sharp/dull sensation on plantar aspect of foot (including toes), difficulty identifying great toe spatial awareness, and RAMPs was slightly impaired. Pt's R ankle proprioception normal with no limitations. General MMT limited greater on the LLE and his 6MWT was limited in distance. Pt would benefit from trial of skilled PT  intervention to address impairments in order to improve his sensation, proprioception, and coordination of RLE in order to improve balance, decrease risk for falls, and allow pt to maintain modified independent level of function, however, PT explained to pt cauda equina and his overall prognosis with this diagnosis.    Clinical Presentation  Evolving    Clinical Presentation due to:  MMT, proprioception, sensation, coordination, 6MWT, SLS    Clinical Decision Making  Moderate    Rehab Potential  Fair    PT Frequency  2x / week    PT Duration  4 weeks    PT Treatment/Interventions  ADLs/Self Care Home Management;Cryotherapy;Electrical Stimulation;Biofeedback;DME Instruction;Gait training;Stair training;Functional mobility training;Therapeutic activities;Therapeutic exercise;Balance training;Neuromuscular re-education;Patient/family education;Orthotic Fit/Training;Manual techniques;Passive range of motion;Energy conservation;Taping    PT Next Visit Plan  review goals, initiate HEP, address proprioception, sensation, and coordination deficits in RLE, strenghtening and balance training    PT Home Exercise Plan  initiate at f/u viist    Consulted and Agree with Plan of Care  Patient       Patient will benefit from skilled therapeutic intervention in order to improve the following deficits and impairments:  Abnormal gait, Decreased activity tolerance, Decreased balance, Decreased coordination, Decreased mobility, Decreased range of motion, Decreased strength, Difficulty walking, Impaired sensation  Visit Diagnosis: Other disturbances of skin sensation - Plan: PT plan of care cert/re-cert  Unspecified lack of coordination - Plan: PT plan of care cert/re-cert  Unsteadiness on feet - Plan: PT plan of care cert/re-cert     Problem List Patient Active Problem List   Diagnosis Date Noted  . 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 Infection of urinary tract 10/26/2015  . Intractable  back pain 10/22/2015  . Back pain 10/22/2015  . Muscle weakness (generalized) 12/05/2013  . Tight fascia 12/05/2013  . Decreased range of motion of right shoulder 12/05/2013  . Decreased range of motion of left shoulder 12/05/2013  . Bilateral leg weakness 11/21/2013  . Poor balance 11/21/2013  . Difficulty in walking(719.7) 11/21/2013  . Shoulder pain, left 09/12/2013  . Anemia, iron deficiency 09/09/2013  . Dyspnea 09/07/2013  . UTI (urinary tract infection) 09/07/2013  . Acute CHF (congestive heart  failure) (HCC) 09/07/2013  . Elevated troponin 09/07/2013  . Fever and chills 09/06/2013  . Altered mental status 09/05/2013  . Neurogenic bowel 08/22/2013  . 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 Gout flare 08/22/2013  . Postoperative wound infection 08/22/2013  . Lumbar degenerative disc disease 08/02/2013  . Post-operative pain 07/29/2013  . Osteonecrosis (HCC) 05/05/2013  . Small bowel obstruction (HCC) 04/17/2013  . Acute renal failure (HCC) 04/17/2013  . Medial meniscus, posterior horn derangement 02/17/2013  . Knee sprain and strain 02/17/2013  . Trigger point with neck pain 03/18/2012  . Rotator cuff tear arthropathy of right shoulder 09/08/2011  . CAD in native artery 01/09/2011  . Ankle fracture 12/25/2010  . Contusion of shoulder, left 12/25/2010  . PEMPHIGUS VULGARIS 07/02/2010  . MRSA 05/13/2010  . PRURITUS 05/13/2010  . SPINAL STENOSIS, LUMBAR 05/13/2010  . HLD (hyperlipidemia) 04/24/2010  . GASTROESOPHAGEAL REFLUX DISEASE 04/24/2010  . VENTRAL HERNIA, INCISIONAL 04/24/2010  . DEGENERATIVE JOINT DISEASE 04/24/2010  . PYOGENIC ARTHRITIS, SHOULDER REGION 02/04/2010  . Essential hypertension 01/01/2010  . RUPTURE ROTATOR CUFF 02/19/2009          Jac Canavan PT, DPT   [facility] [address] [place], [place], [address] Phone: [phone]   Fax:  [phone]  Name: [name] MRN: [id number] Date of Birth: 1940/08/24

## 2018-04-19 ENCOUNTER — Encounter (HOSPITAL_COMMUNITY): Payer: Self-pay

## 2018-04-19 ENCOUNTER — Ambulatory Visit (HOSPITAL_COMMUNITY): Payer: Medicare Other

## 2018-04-19 DIAGNOSIS — R2681 Unsteadiness on feet: Secondary | ICD-10-CM | POA: Diagnosis not present

## 2018-04-19 DIAGNOSIS — R208 Other disturbances of skin sensation: Secondary | ICD-10-CM | POA: Diagnosis not present

## 2018-04-19 DIAGNOSIS — R279 Unspecified lack of coordination: Secondary | ICD-10-CM | POA: Diagnosis not present

## 2018-04-19 DIAGNOSIS — R262 Difficulty in walking, not elsewhere classified: Secondary | ICD-10-CM | POA: Diagnosis not present

## 2018-04-19 NOTE — Patient Instructions (Signed)
Toe Up    Gently rise up on toes and back on heels. Repeat _10-20___ times. Do 3 sessions. Do _1___ sessions per day.  http://gt2.exer.us/455   Copyright  VHI. All rights reserved.   Knee Extension: Sitting (Single Leg)    Sitting, face away from anchor, knee flexed. Extend knee. Repeat 10-20__ times per set. Repeat with other leg. Do _3_ sets per session. Do _1_ sessions per day.   http://tub.exer.us/55   Copyright  VHI. All rights reserved.    Feet Together, Head Motion - Eyes Closed    With eyes open and feet together. Head movements and eyes closed are more difficult so don't do right now.  30 second bouts Repeat _3-5___ times per session. Do __1__ sessions per day.  Copyright  VHI. All rights reserved.

## 2018-04-19 NOTE — Therapy (Signed)
[facility] [address] [place], [place], [address] Phone: [phone]   Fax:  [phone]  Physical Therapy Treatment  Patient Details  Name: [name] MRN: [id number] Date of Birth: [birth year] Referring Provider: [name], MD   Encounter Date: 04/19/2018  PT End of Session - 04/19/18 1209    Visit Number  2    Number of Visits  9    Date for PT Re-Evaluation  05/13/18    Authorization Type  Medicare (Secondary: [employer] [facility])    Authorization Time Period  04/15/18 to 05/13/18    Authorization - Visit Number  2    Authorization - Number of Visits  10    PT Start Time  1115    PT Stop Time  1158    PT Time Calculation (min)  43 min    Activity Tolerance  Patient tolerated treatment well    Behavior During Therapy  [facility] for tasks assessed/performed       Past Medical History:  Diagnosis Date  . Acute renal failure (HCC) 04/17/2013  . Anemia, iron deficiency 09/09/2013  . Anxiety   . Arthritis   . 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 Coronary artery disease    IN 2000   STENT PLACED IN 2012 sees Dr. Dietrich Pates, saw last 2013  . Disorder of blood    BEEN TREATED BY DERMATOLOGIST X 4 YRS..."BLOOD BLISTERS"  . Gout   . HTN (hypertension)    sees [name] in [place]  . Hx MRSA infection    rt shoulder  . Hyperlipidemia   . Pneumonia    "I've had it 3-4 times"  . Small bowel obstruction (HCC)   . Small bowel problem    HAD ALOT OF SCAR TISSUE FROM PREVIOUS SURGERIES..NG WAS INSERTED ...Marland KitchenMarland KitchenNO SURGERY NEEDED...Marland KitchenMarland KitchenIN FOR 8 DAYS  . Ventral hernia     Past Surgical History:  Procedure Laterality Date  . ANTERIOR CERVICAL CORPECTOMY  12/17/11  . ANTERIOR CERVICAL CORPECTOMY  12/17/2011   Procedure: ANTERIOR CERVICAL CORPECTOMY;  Surgeon: Karn Cassis, MD;  Location: [facility];  Service: Neurosurgery;  Laterality: N/A;  Anterior Cervical Decompression Fusion Five to Thoracic Two with  plating  . BACK SURGERY     lumbar  . CATARACT EXTRACTION W/ INTRAOCULAR LENS  IMPLANT, BILATERAL  ? 2011  . CHOLECYSTECTOMY  2006 "or after"  . CORONARY ANGIOPLASTY WITH STENT PLACEMENT  2012  . CORONARY ARTERY BYPASS GRAFT  2000   CABG X5  . EYE SURGERY     bilateral cataract  . INCISION AND DRAINAGE OF WOUND  ~ 20ll; 12/03/11   "had infection in my right"  . LUMBAR LAMINECTOMY/DECOMPRESSION MICRODISCECTOMY  06/13/2011   Procedure: LUMBAR LAMINECTOMY/DECOMPRESSION MICRODISCECTOMY;  Surgeon: Karn Cassis;  Location: [facility];  Service: Neurosurgery;  Laterality: N/A;  Lumbar three, lumbar four-five Laminectomy  . LUMBAR LAMINECTOMY/DECOMPRESSION MICRODISCECTOMY Right 06/17/2013   Procedure: LUMBAR LAMINECTOMY/DECOMPRESSION MICRODISCECTOMY 1 LEVEL;  Surgeon: Karn Cassis, MD;  Location: [facility];  Service: Neurosurgery;  Laterality: Right;  Right L3-4 Microdiskectomy  . LUMBAR WOUND DEBRIDEMENT N/A 08/02/2013   Procedure: INCISION AND DRAINAGE OF LUMBAR WOUND DEBRIDEMENT;  Surgeon: Karn Cassis, MD;  Location: [facility];  Service: Neurosurgery;  Laterality: N/A;  . PERIPHERALLY INSERTED CENTRAL CATHETER INSERTION  2011 & 11/2011  . SHOULDER ARTHROSCOPY Left 09/13/2013   Procedure: ARTHROSCOPIC IRRIGATION AND DEBRIDEMENT, SYNOVECTOMY ,  LEFT  SHOULDER ;  Surgeon: Thera Flake., MD;  Location: [facility] OR;  Service: Orthopedics;  Laterality: Left;  . SHOULDER OPEN ROTATOR CUFF REPAIR  ~ 2011   right  . STERNAL SURG  2000   HAS HAD 5-6 ON HIS STERNUM; "caught MRSA in it"  . TONSILLECTOMY  1949    There were no vitals filed for this visit.  Subjective Assessment - 04/19/18 1125    Subjective  No changes since last visit. 4/10 pain in low back moreso on left today.     How long can you walk comfortably?  doesn't notice the tingling too much when walking    Patient Stated Goals  try and decrease the numbness    Pain Onset  More than a month ago                        [facility] Adult PT Treatment/Exercise - 04/19/18 0001      Transfers   Transfers  Sit to Stand    Sit to Stand  4: Min guard;With upper extremity assist    Five time sit to stand comments   cue to use UE as little as possible    Number of Reps  10 reps   5 reps 2 sets     Balance   Balance Assessed  Yes      Exercises   Exercises  Lumbar;Knee/Hip      Lumbar Exercises: Seated   Other Seated Lumbar Exercises  heel/toe 1 set x10      Knee/Hip Exercises: Standing   Hip Abduction  Stengthening;Both;1 set;10 reps;Knee straight    Hip Extension  Stengthening;Both;1 set;10 reps;Knee straight    Rocker Board  2 minutes      Knee/Hip Exercises: Seated   Long Arc Quad  Strengthening;Both;1 set;10 reps    Marching  Strengthening;Both;1 set;10 reps          Balance Exercises - 04/19/18 1208      Balance Exercises: Standing   Standing Eyes Opened  Narrow base of support (BOS);Solid surface;3 reps;30 secs   feet together       PT Education - 04/19/18 1213    Education Details  reviewed eval/goals and new HEP, discussed purpose and technique of treatment throughout session.    Person(s) Educated  Patient    Methods  Explanation;Demonstration;Handout    Comprehension  Verbalized understanding;Returned demonstration       PT Short Term Goals - 04/15/18 1123      PT SHORT TERM GOAL #1   Title  Pt will demo improved proprioception of R great toe AEB ability to properly identify great toe movement with EC 50% of the time or better in order to maximize his driving ability.    Time  2    Period  Weeks    Status  New    Target Date  04/29/18      PT SHORT TERM GOAL #2   Title  Pt will have 1/2 grade improvement in MMT in order to maximize his balance and gait.    Time  2    Period  Weeks    Status  New        PT Long Term Goals - 04/15/18 1124      PT LONG TERM GOAL #1   Title  Pt will demo improved sensation of R foot AEB ability to  properly identify sharp/dull sensation 50% of the time or better in order to  maximize his balance.    Time  4    Period  Weeks    Status  New    Target Date  05/13/18      PT LONG TERM GOAL #2   Title  Pt will have subjective reports of being able to accurately place his foot on his gas and brake pedal 75% of the time or better to demo improved proprioception and coordination of his R foot.    Time  4    Period  Weeks    Status  New      PT LONG TERM GOAL #3   Title  Pt will have improved FOTO score by 10% or better in order to demo improved overall function with his R foot.     Time  4    Period  Weeks    Status  New            Plan - 04/19/18 1209    Clinical Impression Statement  Patient required hands on assistance during session for all weight bearing exercises for safety. Session focused on strengthening exercises and balance training and inititating HEP. Added seated heel/toe raises and knee extension, as well as, standing feet together balance to HEP. Tolerated treatment well today with complaints of fatigue on rockerboard and required a seated rest break. Patient will continue to benefit from trial of OPPT address aforementioned deficits and decrease patient's risk for falls.     Rehab Potential  Fair    PT Frequency  2x / week    PT Duration  4 weeks    PT Treatment/Interventions  ADLs/Self Care Home Management;Cryotherapy;Electrical Stimulation;Biofeedback;DME Instruction;Gait training;Stair training;Functional mobility training;Therapeutic activities;Therapeutic exercise;Balance training;Neuromuscular re-education;Patient/family education;Orthotic Fit/Training;Manual techniques;Passive range of motion;Energy conservation;Taping    PT Next Visit Plan  review HEP, address proprioception, sensation, and coordination deficits in RLE, strenghtening and balance training; possible supine bridges, PNF, nerve glides?    PT Home Exercise Plan  04/19/2018 - seated heel/toe raises,  knee extension, standing feet together balance    Consulted and Agree with Plan of Care  Patient       Patient will benefit from skilled therapeutic intervention in order to improve the following deficits and impairments:  Abnormal gait, Decreased activity tolerance, Decreased balance, Decreased coordination, Decreased mobility, Decreased range of motion, Decreased strength, Difficulty walking, Impaired sensation  Visit Diagnosis: Other disturbances of skin sensation  Unspecified lack of coordination  Unsteadiness on feet     Problem List Patient Active Problem List   Diagnosis Date Noted  . 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 Infection of urinary tract 10/26/2015  . Intractable back pain 10/22/2015  . Back pain 10/22/2015  . Muscle weakness (generalized) 12/05/2013  . Tight fascia 12/05/2013  . Decreased range of motion of right shoulder 12/05/2013  . Decreased range of motion of left shoulder 12/05/2013  . Bilateral leg weakness 11/21/2013  . Poor balance 11/21/2013  . Difficulty in walking(719.7) 11/21/2013  . Shoulder pain, left 09/12/2013  . Anemia, iron deficiency 09/09/2013  . Dyspnea 09/07/2013  . UTI (urinary tract infection) 09/07/2013  . Acute CHF (congestive heart failure) (HCC) 09/07/2013  . Elevated troponin 09/07/2013  . Fever and chills 09/06/2013  . Altered mental status 09/05/2013  . Neurogenic bowel 08/22/2013  . 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 Gout flare 08/22/2013  . Postoperative wound infection 08/22/2013  . Lumbar degenerative disc disease 08/02/2013  . Post-operative pain 07/29/2013  . Osteonecrosis (HCC)  05/05/2013  . Small bowel obstruction (HCC) 04/17/2013  . Acute renal failure (HCC) 04/17/2013  . Medial meniscus, posterior horn derangement 02/17/2013  . Knee sprain and strain 02/17/2013  . Trigger point with neck pain 03/18/2012  . Rotator cuff tear arthropathy of right shoulder  09/08/2011  . CAD in native artery 01/09/2011  . Ankle fracture 12/25/2010  . Contusion of shoulder, left 12/25/2010  . PEMPHIGUS VULGARIS 07/02/2010  . MRSA 05/13/2010  . PRURITUS 05/13/2010  . SPINAL STENOSIS, LUMBAR 05/13/2010  . HLD (hyperlipidemia) 04/24/2010  . GASTROESOPHAGEAL REFLUX DISEASE 04/24/2010  . VENTRAL HERNIA, INCISIONAL 04/24/2010  . DEGENERATIVE JOINT DISEASE 04/24/2010  . PYOGENIC ARTHRITIS, SHOULDER REGION 02/04/2010  . Essential hypertension 01/01/2010  . RUPTURE ROTATOR CUFF 02/19/2009    Katina DungBarbara D. Hartnett-Rands, MS, PT Per Ladoris GeneDiem PT [facility] #[id number] 04/19/2018, 12:15 PM  [facility] [address] [place], [place], [address] Phone: [phone]   Fax:  [phone]  Name: [name] MRN: [id number] Date of Birth: 10/29/1940

## 2018-04-22 ENCOUNTER — Ambulatory Visit (HOSPITAL_COMMUNITY): Payer: Medicare Other | Admitting: Physical Therapy

## 2018-04-22 DIAGNOSIS — R2681 Unsteadiness on feet: Secondary | ICD-10-CM | POA: Diagnosis not present

## 2018-04-22 DIAGNOSIS — R262 Difficulty in walking, not elsewhere classified: Secondary | ICD-10-CM

## 2018-04-22 DIAGNOSIS — R208 Other disturbances of skin sensation: Secondary | ICD-10-CM | POA: Diagnosis not present

## 2018-04-22 DIAGNOSIS — R279 Unspecified lack of coordination: Secondary | ICD-10-CM

## 2018-04-22 NOTE — Therapy (Signed)
[name] [address] [place], [place], [address] Phone: [phone]   Fax:  [phone]  Physical Therapy Treatment  Patient Details  Name: [name] MRN: [id number] Date of Birth: 01/14/1941 Referring Provider: [name], MD   Encounter Date: 04/22/2018  PT End of Session - 04/22/18 1057    Visit Number  3    Number of Visits  9    Date for PT Re-Evaluation  05/13/18    Authorization Type  Medicare (Secondary: [employer] [name])    Authorization Time Period  04/15/18 to 05/13/18    Authorization - Visit Number  3    Authorization - Number of Visits  10    PT Start Time  0904    PT Stop Time  0948    PT Time Calculation (min)  44 min    Activity Tolerance  Patient tolerated treatment well    Behavior During Therapy  [name] for tasks assessed/performed       Past Medical History:  Diagnosis Date  . Acute renal failure (HCC) 04/17/2013  . Anemia, iron deficiency 09/09/2013  . Anxiety   . Arthritis   . 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 Coronary artery disease    IN 2000   STENT PLACED IN 2012 sees Dr. Dietrich Pates, saw last 2013  . Disorder of blood    BEEN TREATED BY DERMATOLOGIST X 4 YRS..."BLOOD BLISTERS"  . Gout   . HTN (hypertension)    sees [name] in [place]  . Hx MRSA infection    rt shoulder  . Hyperlipidemia   . Pneumonia    "I've had it 3-4 times"  . Small bowel obstruction (HCC)   . Small bowel problem    HAD ALOT OF SCAR TISSUE FROM PREVIOUS SURGERIES..NG WAS INSERTED ...Marland KitchenMarland KitchenNO SURGERY NEEDED...Marland KitchenMarland KitchenIN FOR 8 DAYS  . Ventral hernia     Past Surgical History:  Procedure Laterality Date  . ANTERIOR CERVICAL CORPECTOMY  12/17/11  . ANTERIOR CERVICAL CORPECTOMY  12/17/2011   Procedure: ANTERIOR CERVICAL CORPECTOMY;  Surgeon: Jake Cassis, MD;  Location: [facility];  Service: Neurosurgery;  Laterality: N/A;  Anterior Cervical Decompression Fusion Five to Thoracic Two with  plating  . BACK SURGERY     lumbar  . CATARACT EXTRACTION W/ INTRAOCULAR LENS  IMPLANT, BILATERAL  ? 2011  . CHOLECYSTECTOMY  2006 "or after"  . CORONARY ANGIOPLASTY WITH STENT PLACEMENT  2012  . CORONARY ARTERY BYPASS GRAFT  2000   CABG X5  . EYE SURGERY     bilateral cataract  . INCISION AND DRAINAGE OF WOUND  ~ 20ll; 12/03/11   "had infection in my right"  . LUMBAR LAMINECTOMY/DECOMPRESSION MICRODISCECTOMY  06/13/2011   Procedure: LUMBAR LAMINECTOMY/DECOMPRESSION MICRODISCECTOMY;  Surgeon: [name];  Location: [facility];  Service: Neurosurgery;  Laterality: N/A;  Lumbar three, lumbar four-five Laminectomy  . LUMBAR LAMINECTOMY/DECOMPRESSION MICRODISCECTOMY Right 06/17/2013   Procedure: LUMBAR LAMINECTOMY/DECOMPRESSION MICRODISCECTOMY 1 LEVEL;  Surgeon: Jake Cassis, MD;  Location: [facility];  Service: Neurosurgery;  Laterality: Right;  Right L3-4 Microdiskectomy  . LUMBAR WOUND DEBRIDEMENT N/A 08/02/2013   Procedure: INCISION AND DRAINAGE OF LUMBAR WOUND DEBRIDEMENT;  Surgeon: Jake Cassis, MD;  Location: [facility];  Service: Neurosurgery;  Laterality: N/A;  . PERIPHERALLY INSERTED CENTRAL CATHETER INSERTION  2011 & 11/2011  . SHOULDER ARTHROSCOPY Left 09/13/2013   Procedure: ARTHROSCOPIC IRRIGATION AND DEBRIDEMENT, SYNOVECTOMY ,  LEFT  SHOULDER ;  Surgeon: Jake Flake., MD;  Location: [name];  Service: Orthopedics;  Laterality: Left;  . SHOULDER OPEN ROTATOR CUFF REPAIR  ~ 2011   right  . STERNAL SURG  2000   HAS HAD 5-6 ON HIS STERNUM; "caught MRSA in it"  . TONSILLECTOMY  1949    There were no vitals filed for this visit.  Subjective Assessment - 04/22/18 0909    Subjective  Pt states he went to [name] yesterday and tried the standing heelraise machine.  States his shoulders and back hurt from that.      Currently in Pain?  Yes    Pain Score  4     Pain Location  Back    Pain Orientation  Lower    Pain Descriptors / Indicators  Aching    Pain  Type  Chronic pain                       [facility] Adult PT Treatment/Exercise - 04/22/18 0001      Transfers   Transfers  Sit to Stand    Sit to Stand  4: Min guard;With upper extremity assist    Five time sit to stand comments   cue to use UE as little as possible    Number of Reps  10 reps      Lumbar Exercises: Standing   Other Standing Lumbar Exercises  vectors in // bars with bilat UE's 5X5" each with postural cues    Other Standing Lumbar Exercises  tandem stance 1' each with max of 5 sec holds with Lt back, 2" with Lt forward      Lumbar Exercises: Seated   Other Seated Lumbar Exercises  heel/toe 1 set x10      Knee/Hip Exercises: Standing   Hip Abduction  Stengthening;Both;1 set;10 reps;Knee straight    Hip Extension  Stengthening;Both;1 set;10 reps;Knee straight    Rocker Board  2 minutes      Knee/Hip Exercises: Seated   Long Arc Quad  Strengthening;Both;1 set;10 reps    Marching  Strengthening;Both;1 set;10 reps               PT Short Term Goals - 04/15/18 1123      PT SHORT TERM GOAL #1   Title  Pt will demo improved proprioception of R great toe AEB ability to properly identify great toe movement with EC 50% of the time or better in order to maximize his driving ability.    Time  2    Period  Weeks    Status  New    Target Date  04/29/18      PT SHORT TERM GOAL #2   Title  Pt will have 1/2 grade improvement in MMT in order to maximize his balance and gait.    Time  2    Period  Weeks    Status  New        PT Long Term Goals - 04/15/18 1124      PT LONG TERM GOAL #1   Title  Pt will demo improved sensation of R foot AEB ability to properly identify sharp/dull sensation 50% of the time or better in order to maximize his balance.    Time  4    Period  Weeks    Status  New    Target Date  05/13/18      PT LONG TERM GOAL #2   Title  Pt will  have subjective reports of being able to accurately place his foot on his gas and brake  pedal 75% of the time or better to demo improved proprioception and coordination of his R foot.    Time  4    Period  Weeks    Status  New      PT LONG TERM GOAL #3   Title  Pt will have improved FOTO score by 10% or better in order to demo improved overall function with his R foot.     Time  4    Period  Weeks    Status  New            Plan - 04/22/18 1059    Clinical Impression Statement  contiued with work on improving LE strength and overall stability.  PT requires frequent rests and close supervision for safety.  Added tandem stance with most diffiuculty with Lt in front due to tendency to shift all weight to rear, Rt LE. Added vector to further work on hip stability.  Manual required for rockerboard to decrease substitition.  Pt without complants and able to complete all actvities.    Rehab Potential  Fair    PT Frequency  2x / week    PT Duration  4 weeks    PT Treatment/Interventions  ADLs/Self Care Home Management;Cryotherapy;Electrical Stimulation;Biofeedback;DME Instruction;Gait training;Stair training;Functional mobility training;Therapeutic activities;Therapeutic exercise;Balance training;Neuromuscular re-education;Patient/family education;Orthotic Fit/Training;Manual techniques;Passive range of motion;Energy conservation;Taping    PT Next Visit Plan  continue with strengthening and balance training.  Add supine bridges and continue to progress standing static balance.     PT Home Exercise Plan  04/19/2018 - seated heel/toe raises, knee extension, standing feet together balance    Consulted and Agree with Plan of Care  Patient       Patient will benefit from skilled therapeutic intervention in order to improve the following deficits and impairments:  Abnormal gait, Decreased activity tolerance, Decreased balance, Decreased coordination, Decreased mobility, Decreased range of motion, Decreased strength, Difficulty walking, Impaired sensation  Visit Diagnosis: Other  disturbances of skin sensation  Unspecified lack of coordination  Unsteadiness on feet  Difficulty walking     Problem List Patient Active Problem List   Diagnosis Date Noted  . 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 Infection of urinary tract 10/26/2015  . Intractable back pain 10/22/2015  . Back pain 10/22/2015  . Muscle weakness (generalized) 12/05/2013  . Tight fascia 12/05/2013  . Decreased range of motion of right shoulder 12/05/2013  . Decreased range of motion of left shoulder 12/05/2013  . Bilateral leg weakness 11/21/2013  . Poor balance 11/21/2013  . Difficulty in walking(719.7) 11/21/2013  . Shoulder pain, left 09/12/2013  . Anemia, iron deficiency 09/09/2013  . Dyspnea 09/07/2013  . UTI (urinary tract infection) 09/07/2013  . Acute CHF (congestive heart failure) (HCC) 09/07/2013  . Elevated troponin 09/07/2013  . Fever and chills 09/06/2013  . Altered mental status 09/05/2013  . Neurogenic bowel 08/22/2013  . 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 Gout flare 08/22/2013  . Postoperative wound infection 08/22/2013  . Lumbar degenerative disc disease 08/02/2013  . Post-operative pain 07/29/2013  . Osteonecrosis (HCC) 05/05/2013  . Small bowel obstruction (HCC) 04/17/2013  . Acute renal failure (HCC) 04/17/2013  . Medial meniscus, posterior horn derangement 02/17/2013  . Knee sprain and strain 02/17/2013  . Trigger point with neck pain 03/18/2012  . Rotator cuff tear arthropathy of right shoulder 09/08/2011  .  CAD in native artery 01/09/2011  . Ankle fracture 12/25/2010  . Contusion of shoulder, left 12/25/2010  . PEMPHIGUS VULGARIS 07/02/2010  . MRSA 05/13/2010  . PRURITUS 05/13/2010  . SPINAL STENOSIS, LUMBAR 05/13/2010  . HLD (hyperlipidemia) 04/24/2010  . GASTROESOPHAGEAL REFLUX DISEASE 04/24/2010  . VENTRAL HERNIA, INCISIONAL 04/24/2010  . DEGENERATIVE JOINT DISEASE 04/24/2010  . PYOGENIC ARTHRITIS,  SHOULDER REGION 02/04/2010  . Essential hypertension 01/01/2010  . RUPTURE ROTATOR CUFF 02/19/2009   Lurena Nida, PTA/CLT [phone]  Lurena Nida 04/22/2018, 11:19 AM  [name] [address] [place], [place], [address] Phone: [phone]   Fax:  [phone]  Name: LYRIK BURESH MRN: [id number] Date of Birth: 1940/10/07

## 2018-04-27 ENCOUNTER — Telehealth (HOSPITAL_COMMUNITY): Payer: Self-pay | Admitting: Physical Therapy

## 2018-04-27 ENCOUNTER — Ambulatory Visit (HOSPITAL_COMMUNITY): Payer: Medicare Other | Admitting: Physical Therapy

## 2018-04-27 NOTE — Telephone Encounter (Signed)
Patient canceled for today he is not feeling well.

## 2018-04-29 ENCOUNTER — Encounter (HOSPITAL_COMMUNITY): Payer: Self-pay

## 2018-04-29 ENCOUNTER — Other Ambulatory Visit: Payer: Self-pay

## 2018-04-29 ENCOUNTER — Ambulatory Visit (HOSPITAL_COMMUNITY): Payer: Medicare Other | Attending: Pulmonary Disease

## 2018-04-29 DIAGNOSIS — R2681 Unsteadiness on feet: Secondary | ICD-10-CM | POA: Insufficient documentation

## 2018-04-29 DIAGNOSIS — R208 Other disturbances of skin sensation: Secondary | ICD-10-CM | POA: Diagnosis not present

## 2018-04-29 DIAGNOSIS — R279 Unspecified lack of coordination: Secondary | ICD-10-CM | POA: Diagnosis not present

## 2018-04-29 DIAGNOSIS — R262 Difficulty in walking, not elsewhere classified: Secondary | ICD-10-CM | POA: Insufficient documentation

## 2018-04-29 DIAGNOSIS — M6281 Muscle weakness (generalized): Secondary | ICD-10-CM | POA: Insufficient documentation

## 2018-04-29 NOTE — Therapy (Signed)
[facility] [address] [place], [place], [address] Phone: [phone]   Fax:  [phone]  Physical Therapy Treatment  Patient Details  Name: [name] MRN: [id number] Date of Birth: 01/19/1941 Referring Provider (PT): [name], MD   Encounter Date: 04/29/2018  PT End of Session - 04/29/18 1022    Visit Number  4    Number of Visits  9    Date for PT Re-Evaluation  05/13/18    Authorization Type  Medicare (Secondary: [employer] [facility])    Authorization Time Period  04/15/18 to 05/13/18    Authorization - Visit Number  4    Authorization - Number of Visits  10    PT Start Time  0948    PT Stop Time  1031    PT Time Calculation (min)  43 min    Activity Tolerance  Patient tolerated treatment well    Behavior During Therapy  [facility] for tasks assessed/performed       Past Medical History:  Diagnosis Date  . Acute renal failure (HCC) 04/17/2013  . Anemia, iron deficiency 09/09/2013  . Anxiety   . Arthritis   . 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 Coronary artery disease    IN 2000   STENT PLACED IN 2012 sees Dr. Dietrich Pates, saw last 2013  . Disorder of blood    BEEN TREATED BY DERMATOLOGIST X 4 YRS..."BLOOD BLISTERS"  . Gout   . HTN (hypertension)    sees [name] in [place]  . Hx MRSA infection    rt shoulder  . Hyperlipidemia   . Pneumonia    "I've had it 3-4 times"  . Small bowel obstruction (HCC)   . Small bowel problem    HAD ALOT OF SCAR TISSUE FROM PREVIOUS SURGERIES..NG WAS INSERTED ...Marland KitchenMarland KitchenNO SURGERY NEEDED...Marland KitchenMarland KitchenIN FOR 8 DAYS  . Ventral hernia     Past Surgical History:  Procedure Laterality Date  . ANTERIOR CERVICAL CORPECTOMY  12/17/11  . ANTERIOR CERVICAL CORPECTOMY  12/17/2011   Procedure: ANTERIOR CERVICAL CORPECTOMY;  Surgeon: Karn Cassis, MD;  Location: [facility];  Service: Neurosurgery;  Laterality: N/A;  Anterior Cervical Decompression Fusion Five to Thoracic Two  with plating  . BACK SURGERY     lumbar  . CATARACT EXTRACTION W/ INTRAOCULAR LENS  IMPLANT, BILATERAL  ? 2011  . CHOLECYSTECTOMY  2006 "or after"  . CORONARY ANGIOPLASTY WITH STENT PLACEMENT  2012  . CORONARY ARTERY BYPASS GRAFT  2000   CABG X5  . EYE SURGERY     bilateral cataract  . INCISION AND DRAINAGE OF WOUND  ~ 20ll; 12/03/11   "had infection in my right"  . LUMBAR LAMINECTOMY/DECOMPRESSION MICRODISCECTOMY  06/13/2011   Procedure: LUMBAR LAMINECTOMY/DECOMPRESSION MICRODISCECTOMY;  Surgeon: Karn Cassis;  Location: [facility];  Service: Neurosurgery;  Laterality: N/A;  Lumbar three, lumbar four-five Laminectomy  . LUMBAR LAMINECTOMY/DECOMPRESSION MICRODISCECTOMY Right 06/17/2013   Procedure: LUMBAR LAMINECTOMY/DECOMPRESSION MICRODISCECTOMY 1 LEVEL;  Surgeon: Karn Cassis, MD;  Location: [facility];  Service: Neurosurgery;  Laterality: Right;  Right L3-4 Microdiskectomy  . LUMBAR WOUND DEBRIDEMENT N/A 08/02/2013   Procedure: INCISION AND DRAINAGE OF LUMBAR WOUND DEBRIDEMENT;  Surgeon: Karn Cassis, MD;  Location: [facility];  Service: Neurosurgery;  Laterality: N/A;  . PERIPHERALLY INSERTED CENTRAL CATHETER INSERTION  2011 & 11/2011  . SHOULDER ARTHROSCOPY Left 09/13/2013   Procedure: ARTHROSCOPIC IRRIGATION AND DEBRIDEMENT, SYNOVECTOMY ,  LEFT SHOULDER ;  Surgeon: Thera Flake., MD;  Location: [facility];  Service: Orthopedics;  Laterality: Left;  . SHOULDER OPEN ROTATOR CUFF REPAIR  ~ 2011   right  . STERNAL SURG  2000   HAS HAD 5-6 ON HIS STERNUM; "caught MRSA in it"  . TONSILLECTOMY  1949    There were no vitals filed for this visit.  Subjective Assessment - 04/29/18 1505    Subjective  Patient is doing well today but reports he had a fall last Monday. He states he also bumped his Rt forearm today and caused a skin tear (he has a hbandaid over it). He reports his low back is bothering him and states his pain is about 4/10, "nothing major".     Patient Stated Goals   try and decrease the numbness    Currently in Pain?  Yes    Pain Score  4     Pain Location  Back    Pain Orientation  Lower    Pain Descriptors / Indicators  Aching    Pain Type  Chronic pain    Pain Onset  More than a month ago    Pain Frequency  Constant       [facility] Adult PT Treatment/Exercise - 04/29/18 0001      Exercises   Exercises  Lumbar;Knee/Hip      Knee/Hip Exercises: Standing   Heel Raises  15 reps;Right;1 set    Heel Raises Limitations  poor/no raise on Lt LE as pt in KAFO    Hip Abduction  Stengthening;Both;10 reps;Knee straight;2 sets   in // bars   Hip Extension  Stengthening;Both;10 reps;Knee straight;2 sets   in // bars     Knee/Hip Exercises: Seated   Other Seated Knee/Hip Exercises  toe raises on Lt LE, 1x 15 reps    Sit to Sand  10 reps;with UE support   from elevated mat, pt using momentum     Knee/Hip Exercises: Supine   Bridges with Ball Squeeze  Strengthening;Both;2 sets;10 reps    Straight Leg Raises  Both;1 set;10 reps   1x 10 Rt, 2x 5 Lt with brace      Balance Exercises - 04/29/18 1509      Balance Exercises: Standing   SLS with Vectors  Solid surface;Upper extremity assist 1;5 reps;Limitations   1x 5 reps bil LE, 3 way vector   Marching Limitations  1 HHA (Rt UE), 2x 10 reps marching bil LE's in // bars, solid surface       PT Education - 04/29/18 1613    Education Details  Educated on exercises throughout session and on safety concerns with transfers and gait with rolaltor.     Person(s) Educated  Patient    Methods  Explanation;Demonstration;Handout    Comprehension  Verbalized understanding       PT Short Term Goals - 04/15/18 1123      PT SHORT TERM GOAL #1   Title  Pt will demo improved proprioception of R great toe AEB ability to properly identify great toe movement with EC 50% of the time or better in order to maximize his driving ability.    Time  2    Period  Weeks    Status  New    Target Date  04/29/18      PT  SHORT TERM GOAL #2   Title  Pt will have 1/2 grade improvement in MMT in order to maximize his balance and gait.  Time  2    Period  Weeks    Status  New        PT Long Term Goals - 04/15/18 1124      PT LONG TERM GOAL #1   Title  Pt will demo improved sensation of R foot AEB ability to properly identify sharp/dull sensation 50% of the time or better in order to maximize his balance.    Time  4    Period  Weeks    Status  New    Target Date  05/13/18      PT LONG TERM GOAL #2   Title  Pt will have subjective reports of being able to accurately place his foot on his gas and brake pedal 75% of the time or better to demo improved proprioception and coordination of his R foot.    Time  4    Period  Weeks    Status  New      PT LONG TERM GOAL #3   Title  Pt will have improved FOTO score by 10% or better in order to demo improved overall function with his R foot.     Time  4    Period  Weeks    Status  New         Plan - 04/29/18 1513    Clinical Impression Statement  This session continued with standing balance and strengthening for bil LE. He requires min guard/supervision for safety as he has decreased LT LE control and is quick/impulsive with movements. He requires cues to take his time and not rush through transfers and gait. Continued today with SLS activities and reduce UE support to advance challenge, patient was able to perform with some encouragement. Supine exercises were continued for hip strengthening and ball squeeze was added to bridge as he has tendency for hips to externally rotate/abduct during this. He will continue to benefit from skilled PT interventions to address impairments and progress mobility.    Rehab Potential  Fair    PT Frequency  2x / week    PT Duration  4 weeks    PT Treatment/Interventions  ADLs/Self Care Home Management;Cryotherapy;Electrical Stimulation;Biofeedback;DME Instruction;Gait training;Stair training;Functional mobility  training;Therapeutic activities;Therapeutic exercise;Balance training;Neuromuscular re-education;Patient/family education;Orthotic Fit/Training;Manual techniques;Passive range of motion;Energy conservation;Taping    PT Next Visit Plan  Continue with strengthening and balance training. Continue with tandem stance training.     PT Home Exercise Plan  04/19/2018 - seated heel/toe raises, knee extension, standing feet together balance    Consulted and Agree with Plan of Care  Patient       Patient will benefit from skilled therapeutic intervention in order to improve the following deficits and impairments:  Abnormal gait, Decreased activity tolerance, Decreased balance, Decreased coordination, Decreased mobility, Decreased range of motion, Decreased strength, Difficulty walking, Impaired sensation  Visit Diagnosis: Other disturbances of skin sensation  Unspecified lack of coordination  Unsteadiness on feet     Problem List Patient Active Problem List   Diagnosis Date Noted  . 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 Infection of urinary tract 10/26/2015  . Intractable back pain 10/22/2015  . Back pain 10/22/2015  . Muscle weakness (generalized) 12/05/2013  . Tight fascia 12/05/2013  . Decreased range of motion of right shoulder 12/05/2013  . Decreased range of motion of left shoulder 12/05/2013  . Bilateral leg weakness 11/21/2013  . Poor balance 11/21/2013  . Difficulty in walking(719.7) 11/21/2013  . Shoulder pain, left 09/12/2013  .  Anemia, iron deficiency 09/09/2013  . Dyspnea 09/07/2013  . UTI (urinary tract infection) 09/07/2013  . Acute CHF (congestive heart failure) (HCC) 09/07/2013  . Elevated troponin 09/07/2013  . Fever and chills 09/06/2013  . Altered mental status 09/05/2013  . Neurogenic bowel 08/22/2013  . 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 Gout flare 08/22/2013  . Postoperative wound infection 08/22/2013  . Lumbar  degenerative disc disease 08/02/2013  . Post-operative pain 07/29/2013  . Osteonecrosis (HCC) 05/05/2013  . Small bowel obstruction (HCC) 04/17/2013  . Acute renal failure (HCC) 04/17/2013  . Medial meniscus, posterior horn derangement 02/17/2013  . Knee sprain and strain 02/17/2013  . Trigger point with neck pain 03/18/2012  . Rotator cuff tear arthropathy of right shoulder 09/08/2011  . CAD in native artery 01/09/2011  . Ankle fracture 12/25/2010  . Contusion of shoulder, left 12/25/2010  . PEMPHIGUS VULGARIS 07/02/2010  . MRSA 05/13/2010  . PRURITUS 05/13/2010  . SPINAL STENOSIS, LUMBAR 05/13/2010  . HLD (hyperlipidemia) 04/24/2010  . GASTROESOPHAGEAL REFLUX DISEASE 04/24/2010  . VENTRAL HERNIA, INCISIONAL 04/24/2010  . DEGENERATIVE JOINT DISEASE 04/24/2010  . PYOGENIC ARTHRITIS, SHOULDER REGION 02/04/2010  . Essential hypertension 01/01/2010  . RUPTURE ROTATOR CUFF 02/19/2009    Valentino Saxon, PT, DPT Physical Therapist with [facility]  04/29/2018 4:21 PM    [facility] [address] [place], [place], [address] Phone: [phone]   Fax:  [phone]  Name: [name] MRN: [id number] Date of Birth: 08-Feb-1941

## 2018-05-04 ENCOUNTER — Ambulatory Visit (HOSPITAL_COMMUNITY): Payer: Medicare Other | Admitting: Physical Therapy

## 2018-05-04 DIAGNOSIS — R2681 Unsteadiness on feet: Secondary | ICD-10-CM

## 2018-05-04 DIAGNOSIS — M6281 Muscle weakness (generalized): Secondary | ICD-10-CM

## 2018-05-04 DIAGNOSIS — R279 Unspecified lack of coordination: Secondary | ICD-10-CM | POA: Diagnosis not present

## 2018-05-04 DIAGNOSIS — R208 Other disturbances of skin sensation: Secondary | ICD-10-CM | POA: Diagnosis not present

## 2018-05-04 DIAGNOSIS — R262 Difficulty in walking, not elsewhere classified: Secondary | ICD-10-CM

## 2018-05-04 NOTE — Therapy (Signed)
[facility] [facility] [address] [place], [place], [address] Phone: [phone]   Fax:  [phone]  Physical Therapy Treatment  Patient Details  Name: [name] MRN: [id number] Date of Birth: 1941/05/15 Referring Provider (PT): [name], MD   Encounter Date: 05/04/2018  PT End of Session - 05/04/18 0959    Visit Number  5    Number of Visits  9    Date for PT Re-Evaluation  05/13/18    Authorization Type  Medicare (Secondary: [employer] [facility])    Authorization Time Period  04/15/18 to 05/13/18    Authorization - Visit Number  5    Authorization - Number of Visits  10    PT Start Time  0906    PT Stop Time  0948    PT Time Calculation (min)  42 min    Activity Tolerance  Patient tolerated treatment well    Behavior During Therapy  [facility] for tasks assessed/performed       Past Medical History:  Diagnosis Date  . Acute renal failure (HCC) 04/17/2013  . Anemia, iron deficiency 09/09/2013  . Anxiety   . Arthritis   . 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 Coronary artery disease    IN 2000   STENT PLACED IN 2012 sees Dr. Dietrich Pates, saw last 2013  . Disorder of blood    BEEN TREATED BY DERMATOLOGIST X 4 YRS..."BLOOD BLISTERS"  . Gout   . HTN (hypertension)    sees [name] in [place]  . Hx MRSA infection    rt shoulder  . Hyperlipidemia   . Pneumonia    "I've had it 3-4 times"  . Small bowel obstruction (HCC)   . Small bowel problem    HAD ALOT OF SCAR TISSUE FROM PREVIOUS SURGERIES..NG WAS INSERTED ...Marland KitchenMarland KitchenNO SURGERY NEEDED...Marland KitchenMarland KitchenIN FOR 8 DAYS  . Ventral hernia     Past Surgical History:  Procedure Laterality Date  . ANTERIOR CERVICAL CORPECTOMY  12/17/11  . ANTERIOR CERVICAL CORPECTOMY  12/17/2011   Procedure: ANTERIOR CERVICAL CORPECTOMY;  Surgeon: Karn Cassis, MD;  [facility]: [facility];  Service: Neurosurgery;  Laterality: N/A;  Anterior Cervical Decompression Fusion Five to Thoracic Two  with plating  . BACK SURGERY     lumbar  . CATARACT EXTRACTION W/ INTRAOCULAR LENS  IMPLANT, BILATERAL  ? 2011  . CHOLECYSTECTOMY  2006 "or after"  . CORONARY ANGIOPLASTY WITH STENT PLACEMENT  2012  . CORONARY ARTERY BYPASS GRAFT  2000   CABG X5  . EYE SURGERY     bilateral cataract  . INCISION AND DRAINAGE OF WOUND  ~ 20ll; 12/03/11   "had infection in my right"  . LUMBAR LAMINECTOMY/DECOMPRESSION MICRODISCECTOMY  06/13/2011   Procedure: LUMBAR LAMINECTOMY/DECOMPRESSION MICRODISCECTOMY;  Surgeon: Karn Cassis;  Location: [facility];  Service: Neurosurgery;  Laterality: N/A;  Lumbar three, lumbar four-five Laminectomy  . LUMBAR LAMINECTOMY/DECOMPRESSION MICRODISCECTOMY Right 06/17/2013   Procedure: LUMBAR LAMINECTOMY/DECOMPRESSION MICRODISCECTOMY 1 LEVEL;  Surgeon: Karn Cassis, MD;  Location: [facility];  Service: Neurosurgery;  Laterality: Right;  Right L3-4 Microdiskectomy  . LUMBAR WOUND DEBRIDEMENT N/A 08/02/2013   Procedure: INCISION AND DRAINAGE OF LUMBAR WOUND DEBRIDEMENT;  Surgeon: Karn Cassis, MD;  Location: [facility];  Service: Neurosurgery;  Laterality: N/A;  . PERIPHERALLY INSERTED CENTRAL CATHETER INSERTION  2011 & 11/2011  . SHOULDER ARTHROSCOPY Left 09/13/2013   Procedure: ARTHROSCOPIC IRRIGATION AND DEBRIDEMENT, SYNOVECTOMY ,  LEFT SHOULDER ;  Surgeon: Thera Flake., MD;  Location: [facility];  Service: Orthopedics;  Laterality: Left;  . SHOULDER OPEN ROTATOR CUFF REPAIR  ~ 2011   right  . STERNAL SURG  2000   HAS HAD 5-6 ON HIS STERNUM; "caught MRSA in it"  . TONSILLECTOMY  1949    There were no vitals filed for this visit.  Subjective Assessment - 05/04/18 1005    Subjective  PT states no falls since last Friday.  Reports he was unable to get to the wall to assist with getting up and had to get his granddaughters husband to help him.  Reports the leg raise on his back caused increase low back pain last session.    Currently in Pain?  Yes    Pain Score  4      Pain Location  Back    Pain Orientation  Lower    Pain Descriptors / Indicators  Aching    Pain Type  Chronic pain                       [facility] Adult PT Treatment/Exercise - 05/04/18 0001      Lumbar Exercises: Seated   Other Seated Lumbar Exercises  heel/toe 1 set x10      Knee/Hip Exercises: Standing   Heel Raises  15 reps;Right;1 set    Heel Raises Limitations  poor/no raise on Lt LE as pt in KAFO    Hip Abduction  Stengthening;Both;10 reps;Knee straight;2 sets    Hip Extension  Stengthening;Both;10 reps;Knee straight;2 sets      Knee/Hip Exercises: Seated   Sit to Sand  10 reps;with UE support      Knee/Hip Exercises: Supine   Short Arc Quad Sets  Left;2 sets;10 reps    Bridges with [employer]  Strengthening;Both;2 sets;10 reps    Straight Leg Raises  10 reps;2 sets;Right               PT Short Term Goals - 04/15/18 1123      PT SHORT TERM GOAL #1   Title  Pt will demo improved proprioception of R great toe AEB ability to properly identify great toe movement with EC 50% of the time or better in order to maximize his driving ability.    Time  2    Period  Weeks    Status  New    Target Date  04/29/18      PT SHORT TERM GOAL #2   Title  Pt will have 1/2 grade improvement in MMT in order to maximize his balance and gait.    Time  2    Period  Weeks    Status  New        PT Long Term Goals - 04/15/18 1124      PT LONG TERM GOAL #1   Title  Pt will demo improved sensation of R foot AEB ability to properly identify sharp/dull sensation 50% of the time or better in order to maximize his balance.    Time  4    Period  Weeks    Status  New    Target Date  05/13/18      PT LONG TERM GOAL #2   Title  Pt will have subjective reports of being able to accurately place his foot on his gas and brake pedal 75% of the time or better to demo improved proprioception and coordination of his R foot.  Time  4    Period  Weeks    Status  New       PT LONG TERM GOAL #3   Title  Pt will have improved FOTO score by 10% or better in order to demo improved overall function with his R foot.     Time  4    Period  Weeks    Status  New            Plan - 05/04/18 1207    Clinical Impression Statement  Continued with LE strengthening and standing balance actvities.  Pt continues to rely heavily on UE's/walker nearby as fearful of falling and weak.   Able to increase some reps today and discontinued SLR on Lt LE due to increasing LBP.  Replaced with SAQ on this side.  Sit to stand completed without UE with elevation and cues for eccentric control.      Rehab Potential  Fair    PT Frequency  2x / week    PT Duration  4 weeks    PT Treatment/Interventions  ADLs/Self Care Home Management;Cryotherapy;Electrical Stimulation;Biofeedback;DME Instruction;Gait training;Stair training;Functional mobility training;Therapeutic activities;Therapeutic exercise;Balance training;Neuromuscular re-education;Patient/family education;Orthotic Fit/Training;Manual techniques;Passive range of motion;Energy conservation;Taping    PT Next Visit Plan  Continue with strengthening and balance training. Continue with tandem stance training.     PT Home Exercise Plan  04/19/2018 - seated heel/toe raises, knee extension, standing feet together balance    Consulted and Agree with Plan of Care  Patient       Patient will benefit from skilled therapeutic intervention in order to improve the following deficits and impairments:  Abnormal gait, Decreased activity tolerance, Decreased balance, Decreased coordination, Decreased mobility, Decreased range of motion, Decreased strength, Difficulty walking, Impaired sensation  Visit Diagnosis: Other disturbances of skin sensation  Unspecified lack of coordination  Unsteadiness on feet  Difficulty walking  Muscle weakness (generalized)     Problem List Patient Active Problem List   Diagnosis Date Noted  . Cauda  equina spinal cord injury (HCC) 10/26/2015  . Infection of urinary tract 10/26/2015  . Intractable back pain 10/22/2015  . Back pain 10/22/2015  . Muscle weakness (generalized) 12/05/2013  . Tight fascia 12/05/2013  . Decreased range of motion of right shoulder 12/05/2013  . Decreased range of motion of left shoulder 12/05/2013  . Bilateral leg weakness 11/21/2013  . Poor balance 11/21/2013  . Difficulty in walking(719.7) 11/21/2013  . Shoulder pain, left 09/12/2013  . Anemia, iron deficiency 09/09/2013  . Dyspnea 09/07/2013  . UTI (urinary tract infection) 09/07/2013  . Acute CHF (congestive heart failure) (HCC) 09/07/2013  . Elevated troponin 09/07/2013  . Fever and chills 09/06/2013  . Altered mental status 09/05/2013  . Neurogenic bowel 08/22/2013  . 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 Gout flare 08/22/2013  . Postoperative wound infection 08/22/2013  . Lumbar degenerative disc disease 08/02/2013  . Post-operative pain 07/29/2013  . Osteonecrosis (HCC) 05/05/2013  . Small bowel obstruction (HCC) 04/17/2013  . Acute renal failure (HCC) 04/17/2013  . Medial meniscus, posterior horn derangement 02/17/2013  . Knee sprain and strain 02/17/2013  . Trigger point with neck pain 03/18/2012  . Rotator cuff tear arthropathy of right shoulder 09/08/2011  . CAD in native artery 01/09/2011  . Ankle fracture 12/25/2010  . Contusion of shoulder, left 12/25/2010  . PEMPHIGUS VULGARIS 07/02/2010  . MRSA 05/13/2010  . PRURITUS 05/13/2010  . SPINAL STENOSIS, LUMBAR 05/13/2010  . HLD (hyperlipidemia) 04/24/2010  .  GASTROESOPHAGEAL REFLUX DISEASE 04/24/2010  . VENTRAL HERNIA, INCISIONAL 04/24/2010  . DEGENERATIVE JOINT DISEASE 04/24/2010  . PYOGENIC ARTHRITIS, SHOULDER REGION 02/04/2010  . Essential hypertension 01/01/2010  . RUPTURE ROTATOR CUFF 02/19/2009   Lurena Nida, PTA/CLT [phone]  Lurena Nida 05/04/2018, 12:10 PM  [facility] [address] [place], [place], [address] Phone: [phone]   Fax:  [phone]  Name: [name] MRN: [id number] Date of Birth: 04-27-1941

## 2018-05-06 ENCOUNTER — Encounter (HOSPITAL_COMMUNITY): Payer: Self-pay

## 2018-05-06 ENCOUNTER — Ambulatory Visit (HOSPITAL_COMMUNITY): Payer: Medicare Other

## 2018-05-06 DIAGNOSIS — R262 Difficulty in walking, not elsewhere classified: Secondary | ICD-10-CM | POA: Diagnosis not present

## 2018-05-06 DIAGNOSIS — R279 Unspecified lack of coordination: Secondary | ICD-10-CM | POA: Diagnosis not present

## 2018-05-06 DIAGNOSIS — M6281 Muscle weakness (generalized): Secondary | ICD-10-CM | POA: Diagnosis not present

## 2018-05-06 DIAGNOSIS — R2681 Unsteadiness on feet: Secondary | ICD-10-CM

## 2018-05-06 DIAGNOSIS — R208 Other disturbances of skin sensation: Principal | ICD-10-CM

## 2018-05-06 NOTE — Therapy (Signed)
[facility] [address] [place], [place], [address] Phone: [phone]   Fax:  [phone]  Physical Therapy Treatment  Patient Details  Name: KYM FENTER MRN: [id number] Date of Birth: [birth year] Referring Provider (PT): [name], MD   Encounter Date: 05/06/2018  PT End of Session - 05/06/18 1114    Visit Number  6    Number of Visits  9    Date for PT Re-Evaluation  05/13/18    Authorization Type  Medicare (Secondary: [employer] [facility])    Authorization Time Period  04/15/18 to 05/13/18    Authorization - Visit Number  6    Authorization - Number of Visits  10    PT Start Time  1116    PT Stop Time  1156    PT Time Calculation (min)  40 min    Activity Tolerance  Patient tolerated treatment well    Behavior During Therapy  [facility] for tasks assessed/performed       Past Medical History:  Diagnosis Date  . Acute renal failure (HCC) 04/17/2013  . Anemia, iron deficiency 09/09/2013  . Anxiety   . Arthritis   . 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 Coronary artery disease    IN 2000   STENT PLACED IN 2012 sees Dr. Dietrich Pates, saw last 2013  . Disorder of blood    BEEN TREATED BY DERMATOLOGIST X 4 YRS..."BLOOD BLISTERS"  . Gout   . HTN (hypertension)    sees [name] in [place]  . Hx MRSA infection    rt shoulder  . Hyperlipidemia   . Pneumonia    "I've had it 3-4 times"  . Small bowel obstruction (HCC)   . Small bowel problem    HAD ALOT OF SCAR TISSUE FROM PREVIOUS SURGERIES..NG WAS INSERTED ...Marland KitchenMarland KitchenNO SURGERY NEEDED...Marland KitchenMarland KitchenIN FOR 8 DAYS  . Ventral hernia     Past Surgical History:  Procedure Laterality Date  . ANTERIOR CERVICAL CORPECTOMY  12/17/11  . ANTERIOR CERVICAL CORPECTOMY  12/17/2011   Procedure: ANTERIOR CERVICAL CORPECTOMY;  Surgeon: Karn Cassis, MD;  Location: [facility];  Service: Neurosurgery;  Laterality: N/A;  Anterior Cervical Decompression Fusion Five to Thoracic  Two with plating  . BACK SURGERY     lumbar  . CATARACT EXTRACTION W/ INTRAOCULAR LENS  IMPLANT, BILATERAL  ? 2011  . CHOLECYSTECTOMY  2006 "or after"  . CORONARY ANGIOPLASTY WITH STENT PLACEMENT  2012  . CORONARY ARTERY BYPASS GRAFT  2000   CABG X5  . EYE SURGERY     bilateral cataract  . INCISION AND DRAINAGE OF WOUND  ~ 20ll; 12/03/11   "had infection in my right"  . LUMBAR LAMINECTOMY/DECOMPRESSION MICRODISCECTOMY  06/13/2011   Procedure: LUMBAR LAMINECTOMY/DECOMPRESSION MICRODISCECTOMY;  Surgeon: Karn Cassis;  Location: [facility];  Service: Neurosurgery;  Laterality: N/A;  Lumbar three, lumbar four-five Laminectomy  . LUMBAR LAMINECTOMY/DECOMPRESSION MICRODISCECTOMY Right 06/17/2013   Procedure: LUMBAR LAMINECTOMY/DECOMPRESSION MICRODISCECTOMY 1 LEVEL;  Surgeon: Karn Cassis, MD;  Location: [facility];  Service: Neurosurgery;  Laterality: Right;  Right L3-4 Microdiskectomy  . LUMBAR WOUND DEBRIDEMENT N/A 08/02/2013   Procedure: INCISION AND DRAINAGE OF LUMBAR WOUND DEBRIDEMENT;  Surgeon: Karn Cassis, MD;  Location: [facility];  Service: Neurosurgery;  Laterality: N/A;  . PERIPHERALLY INSERTED CENTRAL CATHETER INSERTION  2011 & 11/2011  . SHOULDER ARTHROSCOPY Left 09/13/2013   Procedure: ARTHROSCOPIC IRRIGATION AND DEBRIDEMENT, SYNOVECTOMY ,  LEFT SHOULDER ;  Surgeon: Thera Flake., MD;  Location: [facility] OR;  Service: Orthopedics;  Laterality: Left;  . SHOULDER OPEN ROTATOR CUFF REPAIR  ~ 2011   right  . STERNAL SURG  2000   HAS HAD 5-6 ON HIS STERNUM; "caught MRSA in it"  . TONSILLECTOMY  1949    There were no vitals filed for this visit.  Subjective Assessment - 05/06/18 1114    Subjective  Pt states that his L lower back is bothering him today and he's not sure why. Otherwise, no new complaints or issues.    Currently in Pain?  Yes    Pain Score  6     Pain Location  Back    Pain Orientation  Lower;Left    Pain Descriptors / Indicators  Sharp    Pain Type   Chronic pain    Pain Onset  More than a month ago    Pain Frequency  Constant    Aggravating Factors   f              [facility] Adult PT Treatment/Exercise - 05/06/18 0001      Exercises   Exercises  Lumbar;Knee/Hip      Knee/Hip Exercises: Standing   Heel Raises  Both;20 reps    Heel Raises Limitations  poor/no raise on Lt LE as pt in KAFO    Hip Abduction  Stengthening;Both;2 sets;10 reps    Abduction Limitations  RTB    Rocker Board  2 minutes    Rocker Board Limitations  R/L, A/P    Other Standing Knee Exercises  sidestepping in // bars x3RT      Balance Exercises - 05/06/18 1131      Balance Exercises: Standing   Standing Eyes Closed  Narrow base of support (BOS);Solid surface;10 secs   x10 reps, 1 UE assist   Tandem Stance  Eyes open;Foam/compliant surface;Upper extremity support 1;3 reps;10 secs    SLS  Eyes open;Eyes closed;Solid surface;Upper extremity support 2;5 reps;10 secs   EC on RLE   Cone Rotation Limitations  cone taps 2x5RT BLE, BUE support    Heel Raises Limitations  seated -- simulating tapping R foot to gas and brake pedal, EC, x66mins total            PT Education - 05/06/18 1114    Education Details  exercise technique, continue HEP    Person(s) Educated  Patient    Methods  Explanation;Demonstration    Comprehension  Verbalized understanding;Returned demonstration       PT Short Term Goals - 04/15/18 1123      PT SHORT TERM GOAL #1   Title  Pt will demo improved proprioception of R great toe AEB ability to properly identify great toe movement with EC 50% of the time or better in order to maximize his driving ability.    Time  2    Period  Weeks    Status  New    Target Date  04/29/18      PT SHORT TERM GOAL #2   Title  Pt will have 1/2 grade improvement in MMT in order to maximize his balance and gait.    Time  2    Period  Weeks    Status  New        PT Long Term Goals - 04/15/18 1124      PT LONG TERM GOAL #1   Title  Pt  will demo improved sensation of R  foot AEB ability to properly identify sharp/dull sensation 50% of the time or better in order to maximize his balance.    Time  4    Period  Weeks    Status  New    Target Date  05/13/18      PT LONG TERM GOAL #2   Title  Pt will have subjective reports of being able to accurately place his foot on his gas and brake pedal 75% of the time or better to demo improved proprioception and coordination of his R foot.    Time  4    Period  Weeks    Status  New      PT LONG TERM GOAL #3   Title  Pt will have improved FOTO score by 10% or better in order to demo improved overall function with his R foot.     Time  4    Period  Weeks    Status  New            Plan - 05/06/18 1201    Clinical Impression Statement  Continued with established POCO focusing on functional strengthening, balance, coordination, and proprioception of RLE. Added cone taps in standing and then seated simulation of moving RLE back and forth from cones to simulate gas to brake pedal. Pt tended to allow R foot to ER/IR during transition which was causing him to inaccurately hit the cones/gas/brake pedal; pt able to adjust accordingly and hit his target >50% of the time during the activity. Pt able to hold tandem balance on foam without UE for ~3 sec today, but throughout most of balance activities, he utilized at least 1 UE support for steadiness. Pt feels he is steadily improving. Continue as planned, progressing as able.     Rehab Potential  Fair    PT Frequency  2x / week    PT Duration  4 weeks    PT Treatment/Interventions  ADLs/Self Care Home Management;Cryotherapy;Electrical Stimulation;Biofeedback;DME Instruction;Gait training;Stair training;Functional mobility training;Therapeutic activities;Therapeutic exercise;Balance training;Neuromuscular re-education;Patient/family education;Orthotic Fit/Training;Manual techniques;Passive range of motion;Energy conservation;Taping    PT Next  Visit Plan  Continue with strengthening and balance training. Continue with tandem stance training, coordination, and proprioception training    PT Home Exercise Plan  04/19/2018 - seated heel/toe raises, knee extension, standing feet together balance    Consulted and Agree with Plan of Care  Patient       Patient will benefit from skilled therapeutic intervention in order to improve the following deficits and impairments:  Abnormal gait, Decreased activity tolerance, Decreased balance, Decreased coordination, Decreased mobility, Decreased range of motion, Decreased strength, Difficulty walking, Impaired sensation  Visit Diagnosis: Other disturbances of skin sensation  Unspecified lack of coordination  Unsteadiness on feet     Problem List Patient Active Problem List   Diagnosis Date Noted  . 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 Infection of urinary tract 10/26/2015  . Intractable back pain 10/22/2015  . Back pain 10/22/2015  . Muscle weakness (generalized) 12/05/2013  . Tight fascia 12/05/2013  . Decreased range of motion of right shoulder 12/05/2013  . Decreased range of motion of left shoulder 12/05/2013  . Bilateral leg weakness 11/21/2013  . Poor balance 11/21/2013  . Difficulty in walking(719.7) 11/21/2013  . Shoulder pain, left 09/12/2013  . Anemia, iron deficiency 09/09/2013  . Dyspnea 09/07/2013  . UTI (urinary tract infection) 09/07/2013  . Acute CHF (congestive heart failure) (HCC) 09/07/2013  . Elevated troponin 09/07/2013  . Fever  and chills 09/06/2013  . Altered mental status 09/05/2013  . Neurogenic bowel 08/22/2013  . 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 Gout flare 08/22/2013  . Postoperative wound infection 08/22/2013  . Lumbar degenerative disc disease 08/02/2013  . Post-operative pain 07/29/2013  . Osteonecrosis (HCC) 05/05/2013  . Small bowel obstruction (HCC) 04/17/2013  . Acute renal failure (HCC)  04/17/2013  . Medial meniscus, posterior horn derangement 02/17/2013  . Knee sprain and strain 02/17/2013  . Trigger point with neck pain 03/18/2012  . Rotator cuff tear arthropathy of right shoulder 09/08/2011  . CAD in native artery 01/09/2011  . Ankle fracture 12/25/2010  . Contusion of shoulder, left 12/25/2010  . PEMPHIGUS VULGARIS 07/02/2010  . MRSA 05/13/2010  . PRURITUS 05/13/2010  . SPINAL STENOSIS, LUMBAR 05/13/2010  . HLD (hyperlipidemia) 04/24/2010  . GASTROESOPHAGEAL REFLUX DISEASE 04/24/2010  . VENTRAL HERNIA, INCISIONAL 04/24/2010  . DEGENERATIVE JOINT DISEASE 04/24/2010  . PYOGENIC ARTHRITIS, SHOULDER REGION 02/04/2010  . Essential hypertension 01/01/2010  . RUPTURE ROTATOR CUFF 02/19/2009        Jac Canavan PT, DPT  [facility] [address] [place], [place], [address] Phone: [phone]   Fax:  [phone]  Name: BANYAN GOODCHILD MRN: [id number] Date of Birth: [birth year]

## 2018-05-11 ENCOUNTER — Ambulatory Visit (HOSPITAL_COMMUNITY): Payer: Medicare Other

## 2018-05-11 ENCOUNTER — Encounter (HOSPITAL_COMMUNITY): Payer: Self-pay

## 2018-05-11 DIAGNOSIS — R262 Difficulty in walking, not elsewhere classified: Secondary | ICD-10-CM | POA: Diagnosis not present

## 2018-05-11 DIAGNOSIS — R208 Other disturbances of skin sensation: Secondary | ICD-10-CM | POA: Diagnosis not present

## 2018-05-11 DIAGNOSIS — M6281 Muscle weakness (generalized): Secondary | ICD-10-CM | POA: Diagnosis not present

## 2018-05-11 DIAGNOSIS — R279 Unspecified lack of coordination: Secondary | ICD-10-CM | POA: Diagnosis not present

## 2018-05-11 DIAGNOSIS — R2681 Unsteadiness on feet: Secondary | ICD-10-CM | POA: Diagnosis not present

## 2018-05-11 NOTE — Therapy (Signed)
[facility] [place], [place], [address] Phone: [phone]   Fax:  [phone]   Progress Note Reporting Period 04/15/18 to 05/11/18  See note below for Objective Data and Assessment of Progress/Goals.     Physical Therapy Treatment  Patient Details  Name: [name] MRN: [id number] Date of Birth: [birth year] Referring Provider (PT): Sinda Du, MD   Encounter Date: 05/11/2018  PT End of Session - 05/11/18 0944    Visit Number  7    Number of Visits  15    Date for PT Re-Evaluation  06/01/18    Authorization Type  Medicare (Secondary: [employer] [facility])    Authorization Time Period  04/15/18 to 05/13/18; NEW: 05/11/18 to 06/01/18    Authorization - Visit Number  7    Authorization - Number of Visits  15    PT Start Time  0944    PT Stop Time  1024    PT Time Calculation (min)  40 min    Activity Tolerance  Patient tolerated treatment well    Behavior During Therapy  [facility] for tasks assessed/performed       Past Medical History:  Diagnosis Date  . Acute renal failure ([place]) 04/17/2013  . Anemia, iron deficiency 09/09/2013  . Anxiety   . Arthritis   . 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 Coronary artery disease    IN 2000   STENT PLACED IN 2012 sees Dr. Lattie Haw, saw last 2013  . Disorder of blood    BEEN TREATED BY DERMATOLOGIST X 4 YRS..."BLOOD BLISTERS"  . Gout   . HTN (hypertension)    sees Dr. [name] in [place]  . Hx MRSA infection    rt shoulder  . Hyperlipidemia   . Pneumonia    "I've had it 3-4 times"  . Small bowel obstruction ([place])   . Small bowel problem    HAD ALOT OF SCAR TISSUE FROM PREVIOUS SURGERIES..NG WAS INSERTED ...Marland KitchenMarland KitchenNO SURGERY NEEDED...Marland KitchenMarland KitchenIN FOR 8 DAYS  . Ventral hernia     Past Surgical History:  Procedure Laterality Date  . ANTERIOR CERVICAL CORPECTOMY  12/17/11  . ANTERIOR CERVICAL CORPECTOMY  12/17/2011   Procedure: ANTERIOR CERVICAL CORPECTOMY;   Surgeon: Floyce Stakes, MD;  Location: [facility];  Service: Neurosurgery;  Laterality: N/A;  Anterior Cervical Decompression Fusion Five to Thoracic Two with plating  . BACK SURGERY     lumbar  . CATARACT EXTRACTION W/ INTRAOCULAR LENS  IMPLANT, BILATERAL  ? 2011  . CHOLECYSTECTOMY  2006 "or after"  . CORONARY ANGIOPLASTY WITH STENT PLACEMENT  2012  . CORONARY ARTERY BYPASS GRAFT  2000   CABG X5  . EYE SURGERY     bilateral cataract  . INCISION AND DRAINAGE OF WOUND  ~ 20ll; 12/03/11   "had infection in my right"  . LUMBAR LAMINECTOMY/DECOMPRESSION MICRODISCECTOMY  06/13/2011   Procedure: LUMBAR LAMINECTOMY/DECOMPRESSION MICRODISCECTOMY;  Surgeon: Floyce Stakes;  Location: [facility];  Service: Neurosurgery;  Laterality: N/A;  Lumbar three, lumbar four-five Laminectomy  . LUMBAR LAMINECTOMY/DECOMPRESSION MICRODISCECTOMY Right 06/17/2013   Procedure: LUMBAR LAMINECTOMY/DECOMPRESSION MICRODISCECTOMY 1 LEVEL;  Surgeon: Floyce Stakes, MD;  Location: [facility];  Service: Neurosurgery;  Laterality: Right;  Right L3-4 Microdiskectomy  . LUMBAR WOUND DEBRIDEMENT N/A 08/02/2013   Procedure: INCISION AND DRAINAGE OF LUMBAR WOUND DEBRIDEMENT;  Surgeon: Floyce Stakes, MD;  Location: [facility];  Service: Neurosurgery;  Laterality: N/A;  .  PERIPHERALLY INSERTED CENTRAL CATHETER INSERTION  2011 & 11/2011  . SHOULDER ARTHROSCOPY Left 09/13/2013   Procedure: ARTHROSCOPIC IRRIGATION AND DEBRIDEMENT, SYNOVECTOMY ,  LEFT SHOULDER ;  Surgeon: Yvette Rack., MD;  Location: [facility];  Service: Orthopedics;  Laterality: Left;  . SHOULDER OPEN ROTATOR CUFF REPAIR  ~ 2011   right  . STERNAL SURG  2000   HAS HAD 5-6 ON HIS STERNUM; "caught MRSA in it"  . TONSILLECTOMY  1949    There were no vitals filed for this visit.  Subjective Assessment - 05/11/18 0944    Subjective  Pt reports that he's doing well. No new complaints other than being sore from watching his granddaughter play golf yesterday.  No pain, just sore.    Currently in Pain?  No/denies    Pain Onset  More than a month ago         [facility] PT Assessment - 05/11/18 0001      Assessment   Medical Diagnosis  Cauda Equina Syndrome with R Foot Numbness    Referring Provider (PT)  Sinda Du, MD    Onset Date/Surgical Date  --   couple months ago   Next MD Visit  06/22/18    Prior Therapy  yes for LBP and walking      Observation/Other Assessments   Focus on Therapeutic Outcomes (FOTO)   55%   was 51% limited     Sensation   Light Touch  Impaired Detail    Proprioception  Impaired Detail    Proprioception Impaired Details  Impaired RLE    Additional Comments  Sharp/dull plantar aspect R foot: 4/10 accurately identified;  Proprioception R great toe 3/10 accurately identified      Strength   Right Hip ABduction  4+/5   in sitting; was 4+ in sitting   Left Hip Flexion  4+/5   was 4+   Left Hip ABduction  4/5   in sitting; was 4 in sitting   Right Knee Flexion  5/5   was 4+   Left Knee Flexion  4/5   was 3+   Left Knee Extension  4+/5   was 4+   Right Ankle Dorsiflexion  3+/5   was 3+   Left Ankle Dorsiflexion  1/5   was 0                  [facility] Adult PT Treatment/Exercise - 05/11/18 0001      Exercises   Exercises  Lumbar;Knee/Hip      Knee/Hip Exercises: Seated   Other Seated Knee/Hip Exercises  toe raises with 5# on foot RLE 3x10 reps    Other Seated Knee/Hip Exercises  L toe raises x20 reps          Balance Exercises - 05/11/18 1011      Balance Exercises: Standing   Tandem Stance  Eyes closed;Foam/compliant surface;Upper extremity support 1;5 reps;10 secs    Step Over Hurdles / Cones  fwd/lat in //  bars, BUE support x2RT        PT Education - 05/11/18 0944    Education Details  reassessment findings    Person(s) Educated  Patient    Methods  Explanation;Demonstration    Comprehension  Verbalized understanding;Returned demonstration       PT Short Term Goals -  05/11/18 0948      PT SHORT TERM GOAL #1   Title  Pt will demo improved proprioception of R great toe AEB ability to properly  identify great toe movement with EC 50% of the time or better in order to maximize his driving ability.    Time  2    Period  Weeks    Status  On-going      PT SHORT TERM GOAL #2   Title  Pt will have 1/2 grade improvement in MMT in order to maximize his balance and gait.    Time  2    Period  Weeks    Status  Partially Met        PT Long Term Goals - 05/11/18 0948      PT LONG TERM GOAL #1   Title  Pt will demo improved sensation of R foot AEB ability to properly identify sharp/dull sensation 50% of the time or better in order to maximize his balance.    Time  4    Period  Weeks    Status  On-going      PT LONG TERM GOAL #2   Title  Pt will have subjective reports of being able to accurately place his foot on his gas and brake pedal 75% of the time or better to demo improved proprioception and coordination of his R foot.    Baseline  10/15: feels he is 75% accurate    Time  4    Period  Weeks    Status  Achieved      PT LONG TERM GOAL #3   Title  Pt will have improved FOTO score by 10% or better in order to demo improved overall function with his R foot.     Baseline  10/15: 55% (was 51%)    Time  4    Period  Weeks    Status  On-going            Plan - 05/11/18 1029    Clinical Impression Statement  PT reassessed pt's goals and outcome measures this date. Pt has made some progress towards goals as illustrated above. His proprioception and sensation are still impaired AEB difficulty accurately identifying sharp/dull and difficulty identifying which direction his toe was being moved with EC. He does feel his R foot accuracy has improved to 75% when moving it from the gas and brake pedal, which was 50% at initial eval. His FOTO has remained relatively unchanged since initial eval. PT feels pt would benefit from brief extension of PT services to  continue to address proprioception, coordination, and balance deficits and is recommending 2x/week for 3 more weeks. Rest of today's session focused on balance and coordination.     Rehab Potential  Fair    PT Frequency  2x / week    PT Duration  3 weeks    PT Treatment/Interventions  ADLs/Self Care Home Management;Cryotherapy;Electrical Stimulation;Biofeedback;DME Instruction;Gait training;Stair training;Functional mobility training;Therapeutic activities;Therapeutic exercise;Balance training;Neuromuscular re-education;Patient/family education;Orthotic Fit/Training;Manual techniques;Passive range of motion;Energy conservation;Taping    PT Next Visit Plan  Continue with strengthening and balance training. Continue with tandem stance training, coordination, and proprioception training, balance EC    PT Home Exercise Plan  04/19/2018 - seated heel/toe raises, knee extension, standing feet together balance    Consulted and Agree with Plan of Care  Patient       Patient will benefit from skilled therapeutic intervention in order to improve the following deficits and impairments:  Abnormal gait, Decreased activity tolerance, Decreased balance, Decreased coordination, Decreased mobility, Decreased range of motion, Decreased strength, Difficulty walking, Impaired sensation  Visit Diagnosis: Other disturbances of skin sensation - Plan:  PT plan of care cert/re-cert  Unspecified lack of coordination - Plan: PT plan of care cert/re-cert  Unsteadiness on feet - Plan: PT plan of care cert/re-cert     Problem List Patient Active Problem List   Diagnosis Date Noted  . Cauda equina spinal cord injury ([place]) 10/26/2015  . Infection of urinary tract 10/26/2015  . Intractable back pain 10/22/2015  . Back pain 10/22/2015  . Muscle weakness (generalized) 12/05/2013  . Tight fascia 12/05/2013  . Decreased range of motion of right shoulder 12/05/2013  . Decreased range of motion of left shoulder 12/05/2013   . Bilateral leg weakness 11/21/2013  . Poor balance 11/21/2013  . Difficulty in walking(719.7) 11/21/2013  . Shoulder pain, left 09/12/2013  . Anemia, iron deficiency 09/09/2013  . Dyspnea 09/07/2013  . UTI (urinary tract infection) 09/07/2013  . Acute CHF (congestive heart failure) ([place]) 09/07/2013  . Elevated troponin 09/07/2013  . Fever and chills 09/06/2013  . Altered mental status 09/05/2013  . Neurogenic bowel 08/22/2013  . Cauda equina syndrome with neurogenic bladder ([place]) 08/22/2013  . Gout flare 08/22/2013  . Postoperative wound infection 08/22/2013  . Lumbar degenerative disc disease 08/02/2013  . Post-operative pain 07/29/2013  . Osteonecrosis ([place]) 05/05/2013  . Small bowel obstruction ([place]) 04/17/2013  . Acute renal failure ([place]) 04/17/2013  . Medial meniscus, posterior horn derangement 02/17/2013  . Knee sprain and strain 02/17/2013  . Trigger point with neck pain 03/18/2012  . Rotator cuff tear arthropathy of right shoulder 09/08/2011  . CAD in native artery 01/09/2011  . Ankle fracture 12/25/2010  . Contusion of shoulder, left 12/25/2010  . PEMPHIGUS VULGARIS 07/02/2010  . MRSA 05/13/2010  . PRURITUS 05/13/2010  . SPINAL STENOSIS, LUMBAR 05/13/2010  . HLD (hyperlipidemia) 04/24/2010  . GASTROESOPHAGEAL REFLUX DISEASE 04/24/2010  . VENTRAL HERNIA, INCISIONAL 04/24/2010  . DEGENERATIVE JOINT DISEASE 04/24/2010  . PYOGENIC ARTHRITIS, SHOULDER REGION 02/04/2010  . Essential hypertension 01/01/2010  . RUPTURE ROTATOR CUFF 02/19/2009    Geralyn Corwin 05/11/2018, 10:30 AM  [place], [place], [address] Phone: [phone]   Fax:  [phone]  Name: [name] MRN: [id number] Date of Birth: 04/04/1941

## 2018-05-13 ENCOUNTER — Telehealth (HOSPITAL_COMMUNITY): Payer: Self-pay | Admitting: Pulmonary Disease

## 2018-05-13 ENCOUNTER — Ambulatory Visit (HOSPITAL_COMMUNITY): Payer: Medicare Other | Admitting: Physical Therapy

## 2018-05-13 ENCOUNTER — Encounter (HOSPITAL_COMMUNITY): Payer: Medicare Other

## 2018-05-13 NOTE — Telephone Encounter (Signed)
05/13/18  pt called to cancel said he had an upset stomach

## 2018-05-18 ENCOUNTER — Ambulatory Visit (HOSPITAL_COMMUNITY): Payer: Medicare Other | Admitting: Physical Therapy

## 2018-05-18 ENCOUNTER — Telehealth (HOSPITAL_COMMUNITY): Payer: Self-pay | Admitting: Pulmonary Disease

## 2018-05-18 DIAGNOSIS — R279 Unspecified lack of coordination: Secondary | ICD-10-CM

## 2018-05-18 DIAGNOSIS — R262 Difficulty in walking, not elsewhere classified: Secondary | ICD-10-CM

## 2018-05-18 DIAGNOSIS — R2681 Unsteadiness on feet: Secondary | ICD-10-CM

## 2018-05-18 NOTE — Therapy (Signed)
Pt checked in, was waiting to be seen and left due to therapist running behind.  Pt waited 15 minutes past appt time when left.  Lurena Nida, PTA/CLT [phone]

## 2018-05-18 NOTE — Telephone Encounter (Signed)
05/18/18  3:36 Patient said to reschedule him.  His appt was at 3:15 and at 3:34 I was going to see where the therapist was and at the same time he got up and said to reschedule him.  I told him that I was going to see where the therapist was and he said to still just reschedule.  I looked at the schedule and he was down for 10/23 and he said ok

## 2018-05-19 ENCOUNTER — Ambulatory Visit (HOSPITAL_COMMUNITY): Payer: Medicare Other

## 2018-05-19 ENCOUNTER — Encounter (HOSPITAL_COMMUNITY): Payer: Self-pay

## 2018-05-19 DIAGNOSIS — R279 Unspecified lack of coordination: Secondary | ICD-10-CM

## 2018-05-19 DIAGNOSIS — R2681 Unsteadiness on feet: Secondary | ICD-10-CM | POA: Diagnosis not present

## 2018-05-19 DIAGNOSIS — M6281 Muscle weakness (generalized): Secondary | ICD-10-CM | POA: Diagnosis not present

## 2018-05-19 DIAGNOSIS — R262 Difficulty in walking, not elsewhere classified: Secondary | ICD-10-CM

## 2018-05-19 DIAGNOSIS — R208 Other disturbances of skin sensation: Secondary | ICD-10-CM | POA: Diagnosis not present

## 2018-05-19 NOTE — Therapy (Signed)
[place], [place], [address] Phone: [phone]   Fax:  [phone]  Physical Therapy Treatment  Patient Details  Name: [name] MRN: [id number] Date of Birth: Apr 17, 1941 Referring Provider (PT): Sinda Du, MD   Encounter Date: 05/19/2018  PT End of Session - 05/19/18 1159    Visit Number  8    Number of Visits  15    Date for PT Re-Evaluation  06/01/18    Authorization Type  Medicare (Secondary: [employer] of [facility])    Authorization Time Period  04/15/18 to 05/13/18; NEW: 05/11/18 to 06/01/18    Authorization - Visit Number  8    Authorization - Number of Visits  15    PT Start Time  5974    PT Stop Time  1200    PT Time Calculation (min)  43 min    Equipment Utilized During Treatment  Gait belt    Activity Tolerance  Patient tolerated treatment well    Behavior During Therapy  [facility] for tasks assessed/performed       Past Medical History:  Diagnosis Date  . Acute renal failure ([place]) 04/17/2013  . Anemia, iron deficiency 09/09/2013  . Anxiety   . Arthritis   . 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 Coronary artery disease    IN 2000   STENT PLACED IN 2012 sees Dr. Lattie Haw, saw last 2013  . Disorder of blood    BEEN TREATED BY DERMATOLOGIST X 4 YRS..."BLOOD BLISTERS"  . Gout   . HTN (hypertension)    sees Dr. [name] in [place]  . Hx MRSA infection    rt shoulder  . Hyperlipidemia   . Pneumonia    "I've had it 3-4 times"  . Small bowel obstruction ([place])   . Small bowel problem    HAD ALOT OF SCAR TISSUE FROM PREVIOUS SURGERIES..NG WAS INSERTED ...Marland KitchenMarland KitchenNO SURGERY NEEDED...Marland KitchenMarland KitchenIN FOR 8 DAYS  . Ventral hernia     Past Surgical History:  Procedure Laterality Date  . ANTERIOR CERVICAL CORPECTOMY  12/17/11  . ANTERIOR CERVICAL CORPECTOMY  12/17/2011   Procedure: ANTERIOR CERVICAL CORPECTOMY;  Surgeon: Floyce Stakes, MD;  Location: [facility];  Service: Neurosurgery;   Laterality: N/A;  Anterior Cervical Decompression Fusion Five to Thoracic Two with plating  . BACK SURGERY     lumbar  . CATARACT EXTRACTION W/ INTRAOCULAR LENS  IMPLANT, BILATERAL  ? 2011  . CHOLECYSTECTOMY  2006 "or after"  . CORONARY ANGIOPLASTY WITH STENT PLACEMENT  2012  . CORONARY ARTERY BYPASS GRAFT  2000   CABG X5  . EYE SURGERY     bilateral cataract  . INCISION AND DRAINAGE OF WOUND  ~ 20ll; 12/03/11   "had infection in my right"  . LUMBAR LAMINECTOMY/DECOMPRESSION MICRODISCECTOMY  06/13/2011   Procedure: LUMBAR LAMINECTOMY/DECOMPRESSION MICRODISCECTOMY;  Surgeon: Floyce Stakes;  Location: [facility];  Service: Neurosurgery;  Laterality: N/A;  Lumbar three, lumbar four-five Laminectomy  . LUMBAR LAMINECTOMY/DECOMPRESSION MICRODISCECTOMY Right 06/17/2013   Procedure: LUMBAR LAMINECTOMY/DECOMPRESSION MICRODISCECTOMY 1 LEVEL;  Surgeon: Floyce Stakes, MD;  Location: [facility];  Service: Neurosurgery;  Laterality: Right;  Right L3-4 Microdiskectomy  . LUMBAR WOUND DEBRIDEMENT N/A 08/02/2013   Procedure: INCISION AND DRAINAGE OF LUMBAR WOUND DEBRIDEMENT;  Surgeon: Floyce Stakes, MD;  [facility];  Service: Neurosurgery;  Laterality: N/A;  . [place] CATHETER INSERTION  2011 & 11/2011  .  SHOULDER ARTHROSCOPY Left 09/13/2013   Procedure: ARTHROSCOPIC IRRIGATION AND DEBRIDEMENT, SYNOVECTOMY ,  LEFT SHOULDER ;  Surgeon: Yvette Rack., MD;  Location: [facility];  Service: Orthopedics;  Laterality: Left;  . SHOULDER OPEN ROTATOR CUFF REPAIR  ~ 2011   right  . STERNAL SURG  2000   HAS HAD 5-6 ON HIS STERNUM; "caught MRSA in it"  . TONSILLECTOMY  1949    There were no vitals filed for this visit.  Subjective Assessment - 05/19/18 1119    Subjective  Pt stated his back can tell the weather, pain scale 5/10.    Patient Stated Goals  try and decrease the numbness    Currently in Pain?  Yes    Pain Score  5     Pain Location  Back    Pain Orientation   Lower;Left    Pain Type  Chronic pain    Pain Onset  More than a month ago    Pain Frequency  Constant         [facility] Adult PT Treatment/Exercise - 05/19/18 0001      Knee/Hip Exercises: Standing   Heel Raises  Both;20 reps    Heel Raises Limitations  poor/no raise on Lt LE as pt in KAFO      Knee/Hip Exercises: Seated   Other Seated Knee/Hip Exercises  toe raises with 5# on foot RLE 3x10 reps    Other Seated Knee/Hip Exercises  L toe raises x20 reps          Balance Exercises - 05/19/18 1213      Balance Exercises: Standing   Tandem Stance  Eyes open;Foam/compliant surface;3 reps;20 secs;30 secs    SLS with Vectors  Solid surface;Upper extremity assist 1;5 reps;Limitations    Step Over Hurdles / Cones  fwd/lat in //  bars, BUE support x2RT    Cone Rotation  Solid surface;R/L   NBOS solid; difficulty with Lt UE movements   Cone Rotation Limitations  cone taps 2x5RT BLE, BUE support    Marching Limitations  1 HHA (Rt UE), 2x 10 reps marching bil LE's in // bars, solid surface    Heel Raises Limitations  seated -- simulating tapping R foot to gas and brake pedal, EC, x5mns total          PT Short Term Goals - 05/11/18 0948      PT SHORT TERM GOAL #1   Title  Pt will demo improved proprioception of R great toe AEB ability to properly identify great toe movement with EC 50% of the time or better in order to maximize his driving ability.    Time  2    Period  Weeks    Status  On-going      PT SHORT TERM GOAL #2   Title  Pt will have 1/2 grade improvement in MMT in order to maximize his balance and gait.    Time  2    Period  Weeks    Status  Partially Met        PT Long Term Goals - 05/11/18 0948      PT LONG TERM GOAL #1   Title  Pt will demo improved sensation of R foot AEB ability to properly identify sharp/dull sensation 50% of the time or better in order to maximize his balance.    Time  4    Period  Weeks    Status  On-going      PT LONG TERM GOAL #  2    Title  Pt will have subjective reports of being able to accurately place his foot on his gas and brake pedal 75% of the time or better to demo improved proprioception and coordination of his R foot.    Baseline  10/15: feels he is 75% accurate    Time  4    Period  Weeks    Status  Achieved      PT LONG TERM GOAL #3   Title  Pt will have improved FOTO score by 10% or better in order to demo improved overall function with his R foot.     Baseline  10/15: 55% (was 51%)    Time  4    Period  Weeks    Status  On-going            Plan - 05/19/18 1205    Clinical Impression Statement  Session focus primarly with balance training and functional strengthening.  Pt continues to have difficulty with proprioception with tasks to improve foot placement.  Pt limited by LBP and fatigue requiring seated rest breaks due to both through out session.  Added cone rotation to improve reaching outside BOS and crossing mid line to improve trunk stability with gait.  Min A and HHA required through session for safety.    Rehab Potential  Fair    PT Frequency  2x / week    PT Duration  3 weeks    PT Treatment/Interventions  ADLs/Self Care Home Management;Cryotherapy;Electrical Stimulation;Biofeedback;DME Instruction;Gait training;Stair training;Functional mobility training;Therapeutic activities;Therapeutic exercise;Balance training;Neuromuscular re-education;Patient/family education;Orthotic Fit/Training;Manual techniques;Passive range of motion;Energy conservation;Taping    PT Next Visit Plan  Continue with strengthening and balance training. Continue with tandem stance training, coordination, and proprioception training, balance EC    PT Home Exercise Plan  04/19/2018 - seated heel/toe raises, knee extension, standing feet together balance       Patient will benefit from skilled therapeutic intervention in order to improve the following deficits and impairments:  Abnormal gait, Decreased activity  tolerance, Decreased balance, Decreased coordination, Decreased mobility, Decreased range of motion, Decreased strength, Difficulty walking, Impaired sensation  Visit Diagnosis: Unspecified lack of coordination  Unsteadiness on feet  Difficulty walking     Problem List Patient Active Problem List   Diagnosis Date Noted  . 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 Infection of urinary tract 10/26/2015  . Intractable back pain 10/22/2015  . Back pain 10/22/2015  . Muscle weakness (generalized) 12/05/2013  . Tight fascia 12/05/2013  . Decreased range of motion of right shoulder 12/05/2013  . Decreased range of motion of left shoulder 12/05/2013  . Bilateral leg weakness 11/21/2013  . Poor balance 11/21/2013  . Difficulty in walking(719.7) 11/21/2013  . Shoulder pain, left 09/12/2013  . Anemia, iron deficiency 09/09/2013  . Dyspnea 09/07/2013  . UTI (urinary tract infection) 09/07/2013  . Acute CHF (congestive heart failure) ([place]) 09/07/2013  . Elevated troponin 09/07/2013  . Fever and chills 09/06/2013  . Altered mental status 09/05/2013  . Neurogenic bowel 08/22/2013  . Cauda equina syndrome with neurogenic bladder ([place]) 08/22/2013  . Gout flare 08/22/2013  . Postoperative wound infection 08/22/2013  . Lumbar degenerative disc disease 08/02/2013  . Post-operative pain 07/29/2013  . Osteonecrosis ([place]) 05/05/2013  . Small bowel obstruction ([place]) 04/17/2013  . Acute renal failure ([place]) 04/17/2013  . Medial meniscus, posterior horn derangement 02/17/2013  . Knee sprain and strain 02/17/2013  . Trigger point with neck pain 03/18/2012  . Rotator  cuff tear arthropathy of right shoulder 09/08/2011  . CAD in native artery 01/09/2011  . Ankle fracture 12/25/2010  . Contusion of shoulder, left 12/25/2010  . PEMPHIGUS VULGARIS 07/02/2010  . MRSA 05/13/2010  . PRURITUS 05/13/2010  . SPINAL STENOSIS, LUMBAR 05/13/2010  . HLD (hyperlipidemia) 04/24/2010  .  GASTROESOPHAGEAL REFLUX DISEASE 04/24/2010  . VENTRAL HERNIA, INCISIONAL 04/24/2010  . DEGENERATIVE JOINT DISEASE 04/24/2010  . PYOGENIC ARTHRITIS, SHOULDER REGION 02/04/2010  . Essential hypertension 01/01/2010  . RUPTURE ROTATOR CUFF 02/19/2009   Ihor Austin, [place]; [place]  Aldona Lento 05/19/2018, 12:14 PM  [place], [place], [address] Phone: [phone]   Fax:  [phone]  Name: SHYAM DAWSON MRN: [id number] Date of Birth: 1941/03/07

## 2018-05-25 ENCOUNTER — Ambulatory Visit (HOSPITAL_COMMUNITY): Payer: Medicare Other

## 2018-05-25 ENCOUNTER — Encounter (HOSPITAL_COMMUNITY): Payer: Self-pay

## 2018-05-25 DIAGNOSIS — R262 Difficulty in walking, not elsewhere classified: Secondary | ICD-10-CM | POA: Diagnosis not present

## 2018-05-25 DIAGNOSIS — R208 Other disturbances of skin sensation: Secondary | ICD-10-CM | POA: Diagnosis not present

## 2018-05-25 DIAGNOSIS — R279 Unspecified lack of coordination: Secondary | ICD-10-CM

## 2018-05-25 DIAGNOSIS — R2681 Unsteadiness on feet: Secondary | ICD-10-CM | POA: Diagnosis not present

## 2018-05-25 DIAGNOSIS — M6281 Muscle weakness (generalized): Secondary | ICD-10-CM | POA: Diagnosis not present

## 2018-05-25 NOTE — Therapy (Signed)
[place], [place], [address] Phone: [phone]   Fax:  [phone]  Physical Therapy Treatment  Patient Details  Name: [name] MRN: [id number] Date of Birth: 09-14-1940 Referring Provider (PT): Sinda Du, MD   Encounter Date: 05/25/2018  PT End of Session - 05/25/18 0923    Visit Number  9    Number of Visits  15    Date for PT Re-Evaluation  06/01/18    Authorization Type  Medicare (Secondary: [employer] of [facility])    Authorization Time Period  04/15/18 to 05/13/18; NEW: 05/11/18 to 06/01/18    Authorization - Visit Number  9    Authorization - Number of Visits  15    PT Start Time  0935    PT Stop Time  1015    PT Time Calculation (min)  40 min    Equipment Utilized During Treatment  Gait belt    Activity Tolerance  Patient tolerated treatment well    Behavior During Therapy  [facility] for tasks assessed/performed       Past Medical History:  Diagnosis Date  . Acute renal failure ([place]) 04/17/2013  . Anemia, iron deficiency 09/09/2013  . Anxiety   . Arthritis   . 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 Coronary artery disease    IN 2000   STENT PLACED IN 2012 sees Dr. Lattie Haw, saw last 2013  . Disorder of blood    BEEN TREATED BY DERMATOLOGIST X 4 YRS..."BLOOD BLISTERS"  . Gout   . HTN (hypertension)    sees Dr. [name] in [place]  . Hx MRSA infection    rt shoulder  . Hyperlipidemia   . Pneumonia    "I've had it 3-4 times"  . Small bowel obstruction ([place])   . Small bowel problem    HAD ALOT OF SCAR TISSUE FROM PREVIOUS SURGERIES..NG WAS INSERTED ...Marland KitchenMarland KitchenNO SURGERY NEEDED...Marland KitchenMarland KitchenIN FOR 8 DAYS  . Ventral hernia     Past Surgical History:  Procedure Laterality Date  . ANTERIOR CERVICAL CORPECTOMY  12/17/11  . ANTERIOR CERVICAL CORPECTOMY  12/17/2011   Procedure: ANTERIOR CERVICAL CORPECTOMY;  Surgeon: Floyce Stakes, MD;  Location: [facility];  Service: Neurosurgery;   Laterality: N/A;  Anterior Cervical Decompression Fusion Five to Thoracic Two with plating  . BACK SURGERY     lumbar  . CATARACT EXTRACTION W/ INTRAOCULAR LENS  IMPLANT, BILATERAL  ? 2011  . CHOLECYSTECTOMY  2006 "or after"  . CORONARY ANGIOPLASTY WITH STENT PLACEMENT  2012  . CORONARY ARTERY BYPASS GRAFT  2000   CABG X5  . EYE SURGERY     bilateral cataract  . INCISION AND DRAINAGE OF WOUND  ~ 20ll; 12/03/11   "had infection in my right"  . LUMBAR LAMINECTOMY/DECOMPRESSION MICRODISCECTOMY  06/13/2011   Procedure: LUMBAR LAMINECTOMY/DECOMPRESSION MICRODISCECTOMY;  Surgeon: Floyce Stakes;  Location: [facility];  Service: Neurosurgery;  Laterality: N/A;  Lumbar three, lumbar four-five Laminectomy  . LUMBAR LAMINECTOMY/DECOMPRESSION MICRODISCECTOMY Right 06/17/2013   Procedure: LUMBAR LAMINECTOMY/DECOMPRESSION MICRODISCECTOMY 1 LEVEL;  Surgeon: Floyce Stakes, MD;  Location: [facility];  Service: Neurosurgery;  Laterality: Right;  Right L3-4 Microdiskectomy  . LUMBAR WOUND DEBRIDEMENT N/A 08/02/2013   Procedure: INCISION AND DRAINAGE OF LUMBAR WOUND DEBRIDEMENT;  Surgeon: Floyce Stakes, MD;  [facility]: [facility];  Service: Neurosurgery;  Laterality: N/A;  . [place] CATHETER INSERTION  2011 & 11/2011  .  SHOULDER ARTHROSCOPY Left 09/13/2013   Procedure: ARTHROSCOPIC IRRIGATION AND DEBRIDEMENT, SYNOVECTOMY ,  LEFT SHOULDER ;  Surgeon: Yvette Rack., MD;  Location: [facility];  Service: Orthopedics;  Laterality: Left;  . SHOULDER OPEN ROTATOR CUFF REPAIR  ~ 2011   right  . STERNAL SURG  2000   HAS HAD 5-6 ON HIS STERNUM; "caught MRSA in it"  . TONSILLECTOMY  1949    There were no vitals filed for this visit.  Subjective Assessment - 05/25/18 0923    Subjective  Pain no wrose than any other day really, pain scale 4/10 on left side of low back. Drives himself to clinic. If goes to drive-in, he places car in park to wait in line.     Patient Stated Goals  try and  decrease the numbness    Pain Onset  More than a month ago                       [facility] Adult PT Treatment/Exercise - 05/25/18 0001      Knee/Hip Exercises: Standing   Rocker Board Limitations  R/L, A/P 2 min each      Knee/Hip Exercises: Seated   Other Seated Knee/Hip Exercises  toe raises with 5# on foot RLE 3x10 reps    Other Seated Knee/Hip Exercises  L toe raises x20 reps          Balance Exercises - 05/25/18 0941      Balance Exercises: Standing   Tandem Stance  Eyes open;Foam/compliant surface;3 reps;20 secs;30 secs    SLS with Vectors  Solid surface;Upper extremity assist 1;5 reps;Limitations    Step Over Hurdles / Cones  fwd/lat in //  bars, BUE support x2RT    Cone Rotation  Solid surface;R/L   NBOS solid; difficulty with Lt UE movements   Cone Rotation Limitations  cone taps 2x5RT BLE, BUE support    Marching Limitations  1 HHA (Rt UE), 2x 10 reps marching bil LE's in // bars, solid surface    Heel Raises Limitations  seated -- simulating tapping R foot to gas and brake pedal, EC, x86mns total          PT Short Term Goals - 05/25/18 1020      PT SHORT TERM GOAL #1   Title  Pt will demo improved proprioception of R great toe AEB ability to properly identify great toe movement with EC 50% of the time or better in order to maximize his driving ability.    Time  2    Period  Weeks    Status  On-going      PT SHORT TERM GOAL #2   Title  Pt will have 1/2 grade improvement in MMT in order to maximize his balance and gait.    Time  2    Period  Weeks    Status  Partially Met        PT Long Term Goals - 05/25/18 1020      PT LONG TERM GOAL #1   Title  Pt will demo improved sensation of R foot AEB ability to properly identify sharp/dull sensation 50% of the time or better in order to maximize his balance.    Time  4    Period  Weeks    Status  On-going      PT LONG TERM GOAL #2   Title  Pt will have subjective reports of being able to  accurately place his foot  on his gas and brake pedal 75% of the time or better to demo improved proprioception and coordination of his R foot.    Baseline  10/15: feels he is 75% accurate    Time  4    Period  Weeks    Status  Achieved      PT LONG TERM GOAL #3   Title  Pt will have improved FOTO score by 10% or better in order to demo improved overall function with his R foot.     Baseline  10/15: 55% (was 51%)    Time  4    Period  Weeks    Status  On-going            Plan - 05/25/18 2993    Clinical Impression Statement  Session focus primarily on balance training and functional strengthening. Pt continues to have difficulty with proprioception with tasks involving foot placement. Pt somewhat limited by LBP and fatigue requiring seated rest breaks due to both through out session. Continued with cone rotation to improve reaching outside BOS and crossing mid line to improve trunk stability with gait. Min A and HHA required through session for safety. Continue with current plan, progressing as able. Verbal cues to stand up tall and look ahead to challenge proprioception/sensation in bilateral lower extremities.     Rehab Potential  Fair    PT Frequency  2x / week    PT Duration  3 weeks    PT Treatment/Interventions  ADLs/Self Care Home Management;Cryotherapy;Electrical Stimulation;Biofeedback;DME Instruction;Gait training;Stair training;Functional mobility training;Therapeutic activities;Therapeutic exercise;Balance training;Neuromuscular re-education;Patient/family education;Orthotic Fit/Training;Manual techniques;Passive range of motion;Energy conservation;Taping    PT Next Visit Plan  Continue with strengthening and balance training. Continue with tandem stance training, coordination, and proprioception training, balance EC    PT Home Exercise Plan  04/19/2018 - seated heel/toe raises, knee extension, standing feet together balance       Patient will benefit from skilled  therapeutic intervention in order to improve the following deficits and impairments:  Abnormal gait, Decreased activity tolerance, Decreased balance, Decreased coordination, Decreased mobility, Decreased range of motion, Decreased strength, Difficulty walking, Impaired sensation  Visit Diagnosis: Unspecified lack of coordination  Unsteadiness on feet  Difficulty walking     Problem List Patient Active Problem List   Diagnosis Date Noted  . 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 Infection of urinary tract 10/26/2015  . Intractable back pain 10/22/2015  . Back pain 10/22/2015  . Muscle weakness (generalized) 12/05/2013  . Tight fascia 12/05/2013  . Decreased range of motion of right shoulder 12/05/2013  . Decreased range of motion of left shoulder 12/05/2013  . Bilateral leg weakness 11/21/2013  . Poor balance 11/21/2013  . Difficulty in walking(719.7) 11/21/2013  . Shoulder pain, left 09/12/2013  . Anemia, iron deficiency 09/09/2013  . Dyspnea 09/07/2013  . UTI (urinary tract infection) 09/07/2013  . Acute CHF (congestive heart failure) ([place]) 09/07/2013  . Elevated troponin 09/07/2013  . Fever and chills 09/06/2013  . Altered mental status 09/05/2013  . Neurogenic bowel 08/22/2013  . Cauda equina syndrome with neurogenic bladder ([place]) 08/22/2013  . Gout flare 08/22/2013  . Postoperative wound infection 08/22/2013  . Lumbar degenerative disc disease 08/02/2013  . Post-operative pain 07/29/2013  . Osteonecrosis ([place]) 05/05/2013  . Small bowel obstruction ([place]) 04/17/2013  . Acute renal failure ([place]) 04/17/2013  . Medial meniscus, posterior horn derangement 02/17/2013  . Knee sprain and strain 02/17/2013  . Trigger point with neck pain 03/18/2012  .  Rotator cuff tear arthropathy of right shoulder 09/08/2011  . CAD in native artery 01/09/2011  . Ankle fracture 12/25/2010  . Contusion of shoulder, left 12/25/2010  . PEMPHIGUS VULGARIS 07/02/2010  . MRSA  05/13/2010  . PRURITUS 05/13/2010  . SPINAL STENOSIS, LUMBAR 05/13/2010  . HLD (hyperlipidemia) 04/24/2010  . GASTROESOPHAGEAL REFLUX DISEASE 04/24/2010  . VENTRAL HERNIA, INCISIONAL 04/24/2010  . DEGENERATIVE JOINT DISEASE 04/24/2010  . PYOGENIC ARTHRITIS, SHOULDER REGION 02/04/2010  . Essential hypertension 01/01/2010  . RUPTURE ROTATOR CUFF 02/19/2009    Floria Raveling. Hartnett-Rands, MS, PT [facility] [place] #[id number] 05/25/2018, 10:20 AM  [facility] [address] [place], [place], [address] Phone: [phone]   Fax:  [phone]  Name: [name] MRN: [id number] Date of Birth: 04-01-1941

## 2018-05-27 ENCOUNTER — Ambulatory Visit (HOSPITAL_COMMUNITY): Payer: Medicare Other

## 2018-05-27 DIAGNOSIS — R208 Other disturbances of skin sensation: Secondary | ICD-10-CM | POA: Diagnosis not present

## 2018-05-27 DIAGNOSIS — R2681 Unsteadiness on feet: Secondary | ICD-10-CM | POA: Diagnosis not present

## 2018-05-27 DIAGNOSIS — R279 Unspecified lack of coordination: Secondary | ICD-10-CM | POA: Diagnosis not present

## 2018-05-27 DIAGNOSIS — R262 Difficulty in walking, not elsewhere classified: Secondary | ICD-10-CM | POA: Diagnosis not present

## 2018-05-27 DIAGNOSIS — M6281 Muscle weakness (generalized): Secondary | ICD-10-CM | POA: Diagnosis not present

## 2018-05-27 NOTE — Therapy (Signed)
[place], [place], [address] Phone: [phone]   Fax:  [phone]  Physical Therapy Treatment  Patient Details  Name: [name] MRN: [id number] Date of Birth: 06-21-1941 Referring Provider (PT): Sinda Du, MD   Encounter Date: 05/27/2018  PT End of Session - 05/27/18 1207    Visit Number  10    Number of Visits  15    Date for PT Re-Evaluation  06/01/18    Authorization Type  Medicare (Secondary: [employer] [facility])    Authorization Time Period  04/15/18 to 05/13/18; NEW: 05/11/18 to 06/01/18    Authorization - Visit Number  10    Authorization - Number of Visits  15    PT Start Time  1122    PT Stop Time  1201    PT Time Calculation (min)  39 min    Equipment Utilized During Treatment  Gait belt    Activity Tolerance  Patient tolerated treatment well    Behavior During Therapy  [facility] for tasks assessed/performed       Past Medical History:  Diagnosis Date  . Acute renal failure ([place]) 04/17/2013  . Anemia, iron deficiency 09/09/2013  . Anxiety   . Arthritis   . 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 Coronary artery disease    IN 2000   STENT PLACED IN 2012 sees Dr. Lattie Haw, saw last 2013  . Disorder of blood    BEEN TREATED BY DERMATOLOGIST X 4 YRS..."BLOOD BLISTERS"  . Gout   . HTN (hypertension)    sees Dr. [name] in [place]  . Hx MRSA infection    rt shoulder  . Hyperlipidemia   . Pneumonia    "I've had it 3-4 times"  . Small bowel obstruction ([place])   . Small bowel problem    HAD ALOT OF SCAR TISSUE FROM PREVIOUS SURGERIES..NG WAS INSERTED ...Marland KitchenMarland KitchenNO SURGERY NEEDED...Marland KitchenMarland KitchenIN FOR 8 DAYS  . Ventral hernia     Past Surgical History:  Procedure Laterality Date  . ANTERIOR CERVICAL CORPECTOMY  12/17/11  . ANTERIOR CERVICAL CORPECTOMY  12/17/2011   Procedure: ANTERIOR CERVICAL CORPECTOMY;  Surgeon: Floyce Stakes, MD;  Location: [place] [facility];  Service:  Neurosurgery;  Laterality: N/A;  Anterior Cervical Decompression Fusion Five to Thoracic Two with plating  . BACK SURGERY     lumbar  . CATARACT EXTRACTION W/ INTRAOCULAR LENS  IMPLANT, BILATERAL  ? 2011  . CHOLECYSTECTOMY  2006 "or after"  . CORONARY ANGIOPLASTY WITH STENT PLACEMENT  2012  . CORONARY ARTERY BYPASS GRAFT  2000   CABG X5  . EYE SURGERY     bilateral cataract  . INCISION AND DRAINAGE OF WOUND  ~ 20ll; 12/03/11   "had infection in my right"  . LUMBAR LAMINECTOMY/DECOMPRESSION MICRODISCECTOMY  06/13/2011   Procedure: LUMBAR LAMINECTOMY/DECOMPRESSION MICRODISCECTOMY;  Surgeon: Floyce Stakes;  Location: [place] [facility];  Service: Neurosurgery;  Laterality: N/A;  Lumbar three, lumbar four-five Laminectomy  . LUMBAR LAMINECTOMY/DECOMPRESSION MICRODISCECTOMY Right 06/17/2013   Procedure: LUMBAR LAMINECTOMY/DECOMPRESSION MICRODISCECTOMY 1 LEVEL;  Surgeon: Floyce Stakes, MD;  Location: [place] [facility];  Service: Neurosurgery;  Laterality: Right;  Right L3-4 Microdiskectomy  . LUMBAR WOUND DEBRIDEMENT N/A 08/02/2013   Procedure: INCISION AND DRAINAGE OF LUMBAR WOUND DEBRIDEMENT;  Surgeon: Floyce Stakes, MD;  Location: [facility];  Service: Neurosurgery;  Laterality: N/A;  . [place] CATHETER INSERTION  2011 & 11/2011  .  SHOULDER ARTHROSCOPY Left 09/13/2013   Procedure: ARTHROSCOPIC IRRIGATION AND DEBRIDEMENT, SYNOVECTOMY ,  LEFT SHOULDER ;  Surgeon: Yvette Rack., MD;  Location: [place];  Service: Orthopedics;  Laterality: Left;  . SHOULDER OPEN ROTATOR CUFF REPAIR  ~ 2011   right  . STERNAL SURG  2000   HAS HAD 5-6 ON HIS STERNUM; "caught MRSA in it"  . TONSILLECTOMY  1949    There were no vitals filed for this visit.   Subjective Assessment - 05/27/18 1209    Subjective  Patient reports he is doing well overall today. He has an appointment at 1215 today after therapy. He reports 5/10 pain i his lower back today.    Patient Stated Goals  try and decrease the  numbness    Currently in Pain?  Yes    Pain Score  5     Pain Location  Back    Pain Orientation  Lower    Pain Descriptors / Indicators  Aching    Pain Type  Chronic pain    Pain Onset  More than a month ago    Pain Frequency  Constant         [facility] Adult PT Treatment/Exercise - 05/27/18 0001      Knee/Hip Exercises: Standing   Heel Raises  Both;2 sets;10 reps    Heel Raises Limitations  no raise on Lt LE (pt in KAFO) and poor raise on Rt LE    Rocker Board  2 minutes    Rocker Board Limitations  2x 1 minute, lateral 2x 1 minute ant/post      Knee/Hip Exercises: Seated   Other Seated Knee/Hip Exercises  Rt toe raises with 5# on foot 2x10 reps; Lt toe raises 2x 10 reps    Other Seated Knee/Hip Exercises  Rt LE ankle dorsiflexion with Green TB, 20 reps    Sit to Sand  10 reps;with UE support;2 sets        Balance Exercises - 05/27/18 1140      Balance Exercises: Standing   Tandem Stance  Eyes open;3 reps;30 secs   5-10 seconds   SLS with Vectors  Solid surface;Upper extremity assist 1;5 reps;Limitations    Step Over Hurdles / Cones  10x RT lateral step over 2 (6") hurdles, Bil UE support    Cone Rotation  Solid surface;R/L;Upper extremity assist 1    Cone Rotation Limitations  (3 cones) 10 reps, cone taps with 1HHA        PT Education - 05/27/18 1207    Education Details  Educated pt on technique/form to challenge balance with exercises.     Person(s) Educated  Patient    Methods  Explanation    Comprehension  Verbalized understanding       PT Short Term Goals - 05/25/18 1020      PT SHORT TERM GOAL #1   Title  Pt will demo improved proprioception of R great toe AEB ability to properly identify great toe movement with EC 50% of the time or better in order to maximize his driving ability.    Time  2    Period  Weeks    Status  On-going      PT SHORT TERM GOAL #2   Title  Pt will have 1/2 grade improvement in MMT in order to maximize his balance and gait.     Time  2    Period  Weeks    Status  Partially Met  PT Long Term Goals - 05/25/18 1020      PT LONG TERM GOAL #1   Title  Pt will demo improved sensation of R foot AEB ability to properly identify sharp/dull sensation 50% of the time or better in order to maximize his balance.    Time  4    Period  Weeks    Status  On-going      PT LONG TERM GOAL #2   Title  Pt will have subjective reports of being able to accurately place his foot on his gas and brake pedal 75% of the time or better to demo improved proprioception and coordination of his R foot.    Baseline  10/15: feels he is 75% accurate    Time  4    Period  Weeks    Status  Achieved      PT LONG TERM GOAL #3   Title  Pt will have improved FOTO score by 10% or better in order to demo improved overall function with his R foot.     Baseline  10/15: 55% (was 51%)    Time  4    Period  Weeks    Status  On-going        Plan - 05/27/18 1208    Clinical Impression Statement  Therapy continues to focus on LE strengthening sitting/standing and balance/proprioceptive training. He is resistant to changing exercise technique throughout session to increase challenge and requires repeated cues/encouragement to decrease UE support and weight shifting strategies. He required manual facilitation for weight shifting with SLS challenges and continued with cone taps and hurdle negotiation. He will continue to benefit from skilled PT interventions to address impairments and improve independence with functional mobility.    Rehab Potential  Fair    PT Frequency  2x / week    PT Duration  3 weeks    PT Treatment/Interventions  ADLs/Self Care Home Management;Cryotherapy;Electrical Stimulation;Biofeedback;DME Instruction;Gait training;Stair training;Functional mobility training;Therapeutic activities;Therapeutic exercise;Balance training;Neuromuscular re-education;Patient/family education;Orthotic Fit/Training;Manual techniques;Passive range of  motion;Energy conservation;Taping    PT Next Visit Plan  Continue with strengthening and balance training. Continue with tandem stance training, coordination, and proprioception training, balance EC    PT Home Exercise Plan  04/19/2018 - seated heel/toe raises, knee extension, standing feet together balance    Consulted and Agree with Plan of Care  Patient       Patient will benefit from skilled therapeutic intervention in order to improve the following deficits and impairments:  Abnormal gait, Decreased activity tolerance, Decreased balance, Decreased coordination, Decreased mobility, Decreased range of motion, Decreased strength, Difficulty walking, Impaired sensation  Visit Diagnosis: Unspecified lack of coordination  Unsteadiness on feet  Difficulty walking     Problem List Patient Active Problem List   Diagnosis Date Noted  . 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 Infection of urinary tract 10/26/2015  . Intractable back pain 10/22/2015  . Back pain 10/22/2015  . Muscle weakness (generalized) 12/05/2013  . Tight fascia 12/05/2013  . Decreased range of motion of right shoulder 12/05/2013  . Decreased range of motion of left shoulder 12/05/2013  . Bilateral leg weakness 11/21/2013  . Poor balance 11/21/2013  . Difficulty in walking(719.7) 11/21/2013  . Shoulder pain, left 09/12/2013  . Anemia, iron deficiency 09/09/2013  . Dyspnea 09/07/2013  . UTI (urinary tract infection) 09/07/2013  . Acute CHF (congestive heart failure) ([place]) 09/07/2013  . Elevated troponin 09/07/2013  . Fever and chills 09/06/2013  . Altered mental status  09/05/2013  . Neurogenic bowel 08/22/2013  . Cauda equina syndrome with neurogenic bladder ([place]) 08/22/2013  . Gout flare 08/22/2013  . Postoperative wound infection 08/22/2013  . Lumbar degenerative disc disease 08/02/2013  . Post-operative pain 07/29/2013  . Osteonecrosis ([place]) 05/05/2013  . Small bowel obstruction ([place])  04/17/2013  . Acute renal failure ([place]) 04/17/2013  . Medial meniscus, posterior horn derangement 02/17/2013  . Knee sprain and strain 02/17/2013  . Trigger point with neck pain 03/18/2012  . Rotator cuff tear arthropathy of right shoulder 09/08/2011  . CAD in native artery 01/09/2011  . Ankle fracture 12/25/2010  . Contusion of shoulder, left 12/25/2010  . PEMPHIGUS VULGARIS 07/02/2010  . MRSA 05/13/2010  . PRURITUS 05/13/2010  . SPINAL STENOSIS, LUMBAR 05/13/2010  . HLD (hyperlipidemia) 04/24/2010  . GASTROESOPHAGEAL REFLUX DISEASE 04/24/2010  . VENTRAL HERNIA, INCISIONAL 04/24/2010  . DEGENERATIVE JOINT DISEASE 04/24/2010  . PYOGENIC ARTHRITIS, SHOULDER REGION 02/04/2010  . Essential hypertension 01/01/2010  . RUPTURE ROTATOR CUFF 02/19/2009    Kipp Brood, PT, DPT Physical Therapist with [facility]  05/27/2018 12:49 PM    [place], [place], [address] Phone: [phone]   Fax:  [phone]  Name: [name] MRN: [id number] Date of Birth: [birth year]

## 2018-06-01 ENCOUNTER — Telehealth (HOSPITAL_COMMUNITY): Payer: Self-pay | Admitting: Pulmonary Disease

## 2018-06-01 NOTE — Telephone Encounter (Signed)
06/01/18  Pt left [place] a message that he didn't have any more appts scheduled.  I scheduled him a few more and called and left him a message that we have him scheduled on 11/6 and can print him a schedule when he comes.

## 2018-06-02 ENCOUNTER — Telehealth (HOSPITAL_COMMUNITY): Payer: Self-pay | Admitting: Pulmonary Disease

## 2018-06-02 ENCOUNTER — Ambulatory Visit (HOSPITAL_COMMUNITY): Payer: Medicare Other | Attending: Pulmonary Disease

## 2018-06-02 DIAGNOSIS — R208 Other disturbances of skin sensation: Secondary | ICD-10-CM | POA: Insufficient documentation

## 2018-06-02 DIAGNOSIS — R279 Unspecified lack of coordination: Secondary | ICD-10-CM | POA: Insufficient documentation

## 2018-06-02 DIAGNOSIS — R2681 Unsteadiness on feet: Secondary | ICD-10-CM | POA: Insufficient documentation

## 2018-06-02 NOTE — Telephone Encounter (Signed)
06/02/18  I spoke with patient's wife and let her know when his next appt was.... Also added a note to print him his new schedule at next appt.

## 2018-06-08 ENCOUNTER — Encounter (HOSPITAL_COMMUNITY): Payer: Self-pay

## 2018-06-08 ENCOUNTER — Ambulatory Visit (HOSPITAL_COMMUNITY): Payer: Medicare Other

## 2018-06-08 DIAGNOSIS — R208 Other disturbances of skin sensation: Secondary | ICD-10-CM | POA: Diagnosis not present

## 2018-06-08 DIAGNOSIS — R279 Unspecified lack of coordination: Secondary | ICD-10-CM | POA: Diagnosis not present

## 2018-06-08 DIAGNOSIS — R2681 Unsteadiness on feet: Secondary | ICD-10-CM

## 2018-06-08 NOTE — Therapy (Signed)
[facility] [address] [place], [place], [address] Phone: [phone]   Fax:  [phone]   PHYSICAL THERAPY DISCHARGE SUMMARY  Visits from Start of Care: 11  Current functional level related to goals / functional outcomes: See below   Remaining deficits: See below   Education / Equipment: HEP  Plan: Patient agrees to discharge.  Patient goals were not met. Patient is being discharged due to lack of progress.  ?????     Physical Therapy Treatment  Patient Details  Name: [name] MRN: [id number] Date of Birth: October 31, 1940 Referring Provider (PT): Sinda Du, MD   Encounter Date: 06/08/2018  PT End of Session - 06/08/18 1503    Visit Number  11    Number of Visits  15    Date for PT Re-Evaluation  06/01/18    Authorization Type  Medicare (Secondary: [employer] [facility])    Authorization Time Period  04/15/18 to 05/13/18; NEW: 05/11/18 to 06/01/18    Authorization - Visit Number  11    Authorization - Number of Visits  15    PT Start Time  1435    PT Stop Time  1456    PT Time Calculation (min)  21 min    Equipment Utilized During Treatment  Gait belt    Activity Tolerance  Patient tolerated treatment well    Behavior During Therapy  [facility] for tasks assessed/performed       Past Medical History:  Diagnosis Date  . Acute renal failure ([place]) 04/17/2013  . Anemia, iron deficiency 09/09/2013  . Anxiety   . Arthritis   . 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 Coronary artery disease    IN 2000   STENT PLACED IN 2012 sees Dr. Lattie Haw, saw last 2013  . Disorder of blood    BEEN TREATED BY DERMATOLOGIST X 4 YRS..."BLOOD BLISTERS"  . Gout   . HTN (hypertension)    sees Dr. [name] in [place]  . Hx MRSA infection    rt shoulder  . Hyperlipidemia   . Pneumonia    "I've had it 3-4 times"  . Small bowel obstruction ([place])   . Small bowel problem    HAD ALOT OF SCAR TISSUE FROM PREVIOUS  SURGERIES..NG WAS INSERTED ...Marland KitchenMarland KitchenNO SURGERY NEEDED...Marland KitchenMarland KitchenIN FOR 8 DAYS  . Ventral hernia     Past Surgical History:  Procedure Laterality Date  . ANTERIOR CERVICAL CORPECTOMY  12/17/11  . ANTERIOR CERVICAL CORPECTOMY  12/17/2011   Procedure: ANTERIOR CERVICAL CORPECTOMY;  Surgeon: Floyce Stakes, MD;  Location: [place] [facility];  Service: Neurosurgery;  Laterality: N/A;  Anterior Cervical Decompression Fusion Five to Thoracic Two with plating  . BACK SURGERY     lumbar  . CATARACT EXTRACTION W/ INTRAOCULAR LENS  IMPLANT, BILATERAL  ? 2011  . CHOLECYSTECTOMY  2006 "or after"  . CORONARY ANGIOPLASTY WITH STENT PLACEMENT  2012  . CORONARY ARTERY BYPASS GRAFT  2000   CABG X5  . EYE SURGERY     bilateral cataract  . INCISION AND DRAINAGE OF WOUND  ~ 20ll; 12/03/11   "had infection in my right"  . LUMBAR LAMINECTOMY/DECOMPRESSION MICRODISCECTOMY  06/13/2011   Procedure: LUMBAR LAMINECTOMY/DECOMPRESSION MICRODISCECTOMY;  Surgeon: Floyce Stakes;  Location: [facility];  Service: Neurosurgery;  Laterality: N/A;  Lumbar three, lumbar four-five Laminectomy  . LUMBAR LAMINECTOMY/DECOMPRESSION MICRODISCECTOMY Right 06/17/2013   Procedure: LUMBAR LAMINECTOMY/DECOMPRESSION MICRODISCECTOMY 1 LEVEL;  Surgeon: Floyce Stakes,  MD;  Location: [facility];  Service: Neurosurgery;  Laterality: Right;  Right L3-4 Microdiskectomy  . LUMBAR WOUND DEBRIDEMENT N/A 08/02/2013   Procedure: INCISION AND DRAINAGE OF LUMBAR WOUND DEBRIDEMENT;  Surgeon: Floyce Stakes, MD;  Location: [facility];  Service: Neurosurgery;  Laterality: N/A;  . [place] CATHETER INSERTION  2011 & 11/2011  . SHOULDER ARTHROSCOPY Left 09/13/2013   Procedure: ARTHROSCOPIC IRRIGATION AND DEBRIDEMENT, SYNOVECTOMY ,  LEFT SHOULDER ;  Surgeon: Yvette Rack., MD;  Location: [facility];  Service: Orthopedics;  Laterality: Left;  . SHOULDER OPEN ROTATOR CUFF REPAIR  ~ 2011   right  . STERNAL SURG  2000   HAS HAD 5-6 ON HIS STERNUM;  "caught MRSA in it"  . TONSILLECTOMY  1949    There were no vitals filed for this visit.  Subjective Assessment - 06/08/18 1436    Subjective  Pt states that he is feeling well. No new complaints, just normal pain.     Patient Stated Goals  try and decrease the numbness    Pain Onset  More than a month ago         [facility] PT Assessment - 06/08/18 0001      Observation/Other Assessments   Focus on Therapeutic Outcomes (FOTO)   57% limitation      Sensation   Additional Comments  Sharp/dull plantar aspect R foot: 7/10 accurately identified;  Proprioception R great toe 7/10 accurately identified -- guessing on most of these      Strength   Right Hip ABduction  5/5   was 4+   Left Hip Flexion  4+/5   was 4+   Left Hip ABduction  4/5   was 4   Left Knee Flexion  4/5   was 4   Left Knee Extension  4+/5   was 4+   Right Ankle Dorsiflexion  3+/5   was 3+   Left Ankle Dorsiflexion  0/5   was 1                            PT Short Term Goals - 06/08/18 1437      PT SHORT TERM GOAL #1   Title  Pt will demo improved proprioception of R great toe AEB ability to properly identify great toe movement with EC 50% of the time or better in order to maximize his driving ability.    Baseline  11/12: 70% but guessing on most; accurately got ~30-40% correct    Time  2    Period  Weeks    Status  Partially Met      PT SHORT TERM GOAL #2   Title  Pt will have 1/2 grade improvement in MMT in order to maximize his balance and gait.    Baseline  11/12: see MMT, no real improvements since last reassessment    Time  2    Period  Weeks    Status  Partially Met        PT Long Term Goals - 06/08/18 1438      PT LONG TERM GOAL #1   Title  Pt will demo improved sensation of R foot AEB ability to properly identify sharp/dull sensation 50% of the time or better in order to maximize his balance.    Baseline  11/12: 70% but guessing on most; accurately got ~30-40% correct     Time  4    Period  Weeks    Status  Partially Met      PT LONG TERM GOAL #2   Title  Pt will have subjective reports of being able to accurately place his foot on his gas and brake pedal 75% of the time or better to demo improved proprioception and coordination of his R foot.    Baseline  10/15: feels he is 75% accurate    Time  4    Period  Weeks    Status  Achieved      PT LONG TERM GOAL #3   Title  Pt will have improved FOTO score by 10% or better in order to demo improved overall function with his R foot.     Baseline  11/12: 57% (was 55%)    Time  4    Period  Weeks    Status  On-going            Plan - 06/08/18 1500    Clinical Impression Statement  PT reassessed pt's goals and outcome measures. Pt has made some progress towards goals but minimal. He was able to get 70% of the sharp/dull and proprioception questions correctly, however, he was guessing on most of them (he accurately got about 30-40% correct each). PT educated pt that the cauda equina contains the nerve roots/nerves that supply his BLE and that his presentation is just the nature of this syndrome. Pt will be d/c to his HEP at this time and PT encouraged him to continue simulate moving his R foot/RLE from gas pedal to brake as part of his HEP to continue to practice accurately placing his foot and he verbalized understanding. Pt d/c at this time.     Rehab Potential  Fair    PT Frequency  2x / week    PT Duration  3 weeks    PT Treatment/Interventions  ADLs/Self Care Home Management;Cryotherapy;Electrical Stimulation;Biofeedback;DME Instruction;Gait training;Stair training;Functional mobility training;Therapeutic activities;Therapeutic exercise;Balance training;Neuromuscular re-education;Patient/family education;Orthotic Fit/Training;Manual techniques;Passive range of motion;Energy conservation;Taping    PT Next Visit Plan  d/c to HEP    PT Home Exercise Plan  04/19/2018 - seated heel/toe raises, knee extension,  standing feet together balance; 11/12: simulate moving R foot/RLE from gas to brake pedal with EC    Consulted and Agree with Plan of Care  Patient       Patient will benefit from skilled therapeutic intervention in order to improve the following deficits and impairments:  Abnormal gait, Decreased activity tolerance, Decreased balance, Decreased coordination, Decreased mobility, Decreased range of motion, Decreased strength, Difficulty walking, Impaired sensation  Visit Diagnosis: Unspecified lack of coordination - Plan: PT plan of care cert/re-cert  Unsteadiness on feet - Plan: PT plan of care cert/re-cert  Other disturbances of skin sensation - Plan: PT plan of care cert/re-cert     Problem List Patient Active Problem List   Diagnosis Date Noted  . 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 Infection of urinary tract 10/26/2015  . Intractable back pain 10/22/2015  . Back pain 10/22/2015  . Muscle weakness (generalized) 12/05/2013  . Tight fascia 12/05/2013  . Decreased range of motion of right shoulder 12/05/2013  . Decreased range of motion of left shoulder 12/05/2013  . Bilateral leg weakness 11/21/2013  . Poor balance 11/21/2013  . Difficulty in walking(719.7) 11/21/2013  . Shoulder pain, left 09/12/2013  . Anemia, iron deficiency 09/09/2013  . Dyspnea 09/07/2013  . UTI (urinary tract infection) 09/07/2013  . Acute CHF (congestive heart failure) ([place]) 09/07/2013  .  Elevated troponin 09/07/2013  . Fever and chills 09/06/2013  . Altered mental status 09/05/2013  . Neurogenic bowel 08/22/2013  . Cauda equina syndrome with neurogenic bladder ([place]) 08/22/2013  . Gout flare 08/22/2013  . Postoperative wound infection 08/22/2013  . Lumbar degenerative disc disease 08/02/2013  . Post-operative pain 07/29/2013  . Osteonecrosis ([place]) 05/05/2013  . Small bowel obstruction ([place]) 04/17/2013  . Acute renal failure ([place]) 04/17/2013  . Medial meniscus, posterior horn  derangement 02/17/2013  . Knee sprain and strain 02/17/2013  . Trigger point with neck pain 03/18/2012  . Rotator cuff tear arthropathy of right shoulder 09/08/2011  . CAD in native artery 01/09/2011  . Ankle fracture 12/25/2010  . Contusion of shoulder, left 12/25/2010  . PEMPHIGUS VULGARIS 07/02/2010  . MRSA 05/13/2010  . PRURITUS 05/13/2010  . SPINAL STENOSIS, LUMBAR 05/13/2010  . HLD (hyperlipidemia) 04/24/2010  . GASTROESOPHAGEAL REFLUX DISEASE 04/24/2010  . VENTRAL HERNIA, INCISIONAL 04/24/2010  . DEGENERATIVE JOINT DISEASE 04/24/2010  . PYOGENIC ARTHRITIS, SHOULDER REGION 02/04/2010  . Essential hypertension 01/01/2010  . RUPTURE ROTATOR CUFF 02/19/2009        Geraldine Solar PT, DPT  [facility] [address] [place], [place], [address] Phone: [phone]   Fax:  [phone]  Name: [name] MRN: [id number] Date of Birth: 03-Sep-1940

## 2018-06-10 ENCOUNTER — Encounter (HOSPITAL_COMMUNITY): Payer: Medicare Other | Admitting: Physical Therapy

## 2018-06-15 ENCOUNTER — Encounter (HOSPITAL_COMMUNITY): Payer: Medicare Other | Admitting: Physical Therapy

## 2018-06-17 ENCOUNTER — Encounter (HOSPITAL_COMMUNITY): Payer: Medicare Other

## 2018-06-22 ENCOUNTER — Encounter (HOSPITAL_COMMUNITY): Payer: Medicare Other

## 2018-06-22 DIAGNOSIS — Z23 Encounter for immunization: Secondary | ICD-10-CM | POA: Diagnosis not present

## 2018-07-05 DIAGNOSIS — N319 Neuromuscular dysfunction of bladder, unspecified: Secondary | ICD-10-CM | POA: Diagnosis not present

## 2018-07-05 DIAGNOSIS — N312 Flaccid neuropathic bladder, not elsewhere classified: Secondary | ICD-10-CM | POA: Diagnosis not present

## 2018-07-05 DIAGNOSIS — G834 Cauda equina syndrome: Secondary | ICD-10-CM | POA: Diagnosis not present

## 2018-07-05 DIAGNOSIS — N138 Other obstructive and reflux uropathy: Secondary | ICD-10-CM | POA: Diagnosis not present

## 2018-07-05 DIAGNOSIS — N401 Enlarged prostate with lower urinary tract symptoms: Secondary | ICD-10-CM | POA: Diagnosis not present

## 2018-08-25 DIAGNOSIS — I10 Essential (primary) hypertension: Secondary | ICD-10-CM | POA: Diagnosis not present

## 2018-08-25 DIAGNOSIS — I5032 Chronic diastolic (congestive) heart failure: Secondary | ICD-10-CM | POA: Diagnosis not present

## 2018-08-25 DIAGNOSIS — G834 Cauda equina syndrome: Secondary | ICD-10-CM | POA: Diagnosis not present

## 2018-08-25 DIAGNOSIS — I2510 Atherosclerotic heart disease of native coronary artery without angina pectoris: Secondary | ICD-10-CM | POA: Diagnosis not present

## 2018-09-21 DIAGNOSIS — R338 Other retention of urine: Secondary | ICD-10-CM | POA: Diagnosis not present

## 2018-10-11 DIAGNOSIS — R338 Other retention of urine: Secondary | ICD-10-CM | POA: Diagnosis not present

## 2018-11-01 DIAGNOSIS — R338 Other retention of urine: Secondary | ICD-10-CM | POA: Diagnosis not present

## 2018-11-26 DIAGNOSIS — R338 Other retention of urine: Secondary | ICD-10-CM | POA: Diagnosis not present

## 2018-12-22 ENCOUNTER — Telehealth: Payer: Self-pay | Admitting: Cardiovascular Disease

## 2018-12-22 MED ORDER — AMLODIPINE BESYLATE 5 MG PO TABS: 5.0000 mg | ORAL_TABLET | Freq: Every day | ORAL | 3 refills | Status: DC

## 2018-12-22 NOTE — Telephone Encounter (Signed)
Returned pt call. No answer, unable to leave message as voicemail box is full at this time. Will call again later

## 2018-12-22 NOTE — Telephone Encounter (Signed)
Start amlodipine 5 mg daily. Put him in 10:30 AM slot tomorrow but tell him to arrive no later than 10 please, as I may be able to see him earlier.

## 2018-12-22 NOTE — Telephone Encounter (Signed)
Spoke with Dr. Purvis Sheffield he would like to have patient do virtual visit . He suggested to have patient triaged thru phone and advise. [phone].

## 2018-12-22 NOTE — Telephone Encounter (Signed)
[name] called the office stating that he would like to see Dr. Purvis Sheffield in the office due to issues with his BP and states that sometimes he has chest pains.  Spoke with Dr. Purvis Sheffield

## 2018-12-22 NOTE — Addendum Note (Signed)
Addended byAbelino Derrick R on: 12/22/2018 12:15 PM   Modules accepted: Orders

## 2018-12-22 NOTE — Telephone Encounter (Signed)
Pt made aware. Sent in RX. He will be at [facility] office tomorrow morning by 10 am.

## 2018-12-22 NOTE — Telephone Encounter (Signed)
Pt returned call after he saw a missed call from office. He states that he has ben keeping up with his blood pressure and it has been running high (200/102, 187/90, 187/87)  He is taking coreg 25 mg twice daily, cardura 8 mg daily, atorvastatin 40 mg daily, asa 81 mg daily. He does not need nitro most days- but has it if he needs it. He states that he has been having some chest pain at night and some mornings when he wakes up. He stated he is not always doing activity when the pain begins but it does eventually subside. He would rather have an office visit if possible. Please advise.

## 2018-12-23 ENCOUNTER — Ambulatory Visit (INDEPENDENT_AMBULATORY_CARE_PROVIDER_SITE_OTHER): Payer: Medicare Other | Admitting: Cardiovascular Disease

## 2018-12-23 ENCOUNTER — Encounter: Payer: Self-pay | Admitting: Cardiovascular Disease

## 2018-12-23 ENCOUNTER — Other Ambulatory Visit: Payer: Self-pay

## 2018-12-23 ENCOUNTER — Other Ambulatory Visit (HOSPITAL_COMMUNITY)
Admission: RE | Admit: 2018-12-23 | Discharge: 2018-12-23 | Disposition: A | Discharge status: Home / Self Care | Payer: Medicare Other | Source: Ambulatory Visit | Attending: Cardiovascular Disease | Admitting: Cardiovascular Disease

## 2018-12-23 VITALS — BP 152/59 | HR 78 | Temp 98.8°F | Ht 72.0 in | Wt 162.0 lb

## 2018-12-23 DIAGNOSIS — I10 Essential (primary) hypertension: Secondary | ICD-10-CM | POA: Insufficient documentation

## 2018-12-23 DIAGNOSIS — I5032 Chronic diastolic (congestive) heart failure: Secondary | ICD-10-CM | POA: Insufficient documentation

## 2018-12-23 DIAGNOSIS — E785 Hyperlipidemia, unspecified: Secondary | ICD-10-CM

## 2018-12-23 DIAGNOSIS — I4891 Unspecified atrial fibrillation: Secondary | ICD-10-CM | POA: Insufficient documentation

## 2018-12-23 DIAGNOSIS — Z7189 Other specified counseling: Secondary | ICD-10-CM

## 2018-12-23 DIAGNOSIS — I25708 Atherosclerosis of coronary artery bypass graft(s), unspecified, with other forms of angina pectoris: Secondary | ICD-10-CM

## 2018-12-23 LAB — CBC
HCT: 39 % (ref 39.0–52.0)
Hemoglobin: 12.6 g/dL — ABNORMAL LOW (ref 13.0–17.0)
MCH: 30.4 pg (ref 26.0–34.0)
MCHC: 32.3 g/dL (ref 30.0–36.0)
MCV: 94 fL (ref 80.0–100.0)
Platelets: 204 10*3/uL (ref 150–400)
RBC: 4.15 MIL/uL — ABNORMAL LOW (ref 4.22–5.81)
RDW: 15 % (ref 11.5–15.5)
WBC: 7.2 10*3/uL (ref 4.0–10.5)
nRBC: 0 % (ref 0.0–0.2)

## 2018-12-23 LAB — BASIC METABOLIC PANEL
Anion gap: 11 (ref 5–15)
BUN: 24 mg/dL — ABNORMAL HIGH (ref 8–23)
CO2: 27 mmol/L (ref 22–32)
Calcium: 9.2 mg/dL (ref 8.9–10.3)
Chloride: 100 mmol/L (ref 98–111)
Creatinine, Ser: 1.27 mg/dL — ABNORMAL HIGH (ref 0.61–1.24)
GFR calc Af Amer: >60 mL/min (ref >60)
GFR calc non Af Amer: 55 mL/min — ABNORMAL LOW (ref >60)
Glucose, Bld: 122 mg/dL — ABNORMAL HIGH (ref 70–99)
Potassium: 4.6 mmol/L (ref 3.5–5.1)
Sodium: 138 mmol/L (ref 135–145)

## 2018-12-23 MED ORDER — APIXABAN 5 MG PO TABS: 5.0000 mg | ORAL_TABLET | Freq: Two times a day (BID) | ORAL | 11 refills | Status: DC

## 2018-12-23 MED ORDER — CARVEDILOL 12.5 MG PO TABS: 12.5000 mg | ORAL_TABLET | Freq: Two times a day (BID) | ORAL | 3 refills | Status: DC

## 2018-12-23 NOTE — Patient Instructions (Signed)
Medication Instructions: STOP Aspirin  INCREASE Coreg to 12.5 mg twice a day   START Eliquis 5 mg twice a day  Labwork: CBC,BMET today  Procedures/Testing: Your physician has requested that you have an echocardiogram. Echocardiography is a painless test that uses sound waves to create images of your heart. It provides your doctor with information about the size and shape of your heart and how well your heart's chambers and valves are working. This procedure takes approximately one hour. There are no restrictions for this procedure.    Follow-Up: 3 months with Dr.Koneswaran  Any Additional Special Instructions Will Be Listed Below (If Applicable).     If you need a refill on your cardiac medications before your next appointment, please call your pharmacy.

## 2018-12-23 NOTE — Progress Notes (Signed)
SUBJECTIVE: The patient presents for routine follow-up.  He called our office yesterday stating that his blood pressure has been running anywhere from 187-200/87-102.  He takes Coreg 25 mg daily (per his preference) and Cardura 8 mg daily.  He has had occasional chest pains but infrequently takes nitroglycerin.  Yesterday, I prescribed amlodipine 5 mg daily which he plans to pick up today.  Today, his blood pressure is 152/59 in our office.  He thinks his blood pressure has been elevated over the past week due to the fact that he has been physically inactive for the past 9 weeks due to the pandemic.  He has also been consuming a lot of fast food including biscuits every morning.  He has a history of coronary artery disease with CABG and chronic diastolic heart failure.  Most recent echocardiogram performed on 02/10/2017 showed normal left ventricular systolic function, LVEF 50 to 44%, mild LVH, grade indeterminate diastolic dysfunction with high ventricular filling pressures, regional wall variation, mild mitral and tricuspid regurgitation, and mild aortic root dilatation (3.9 cm).  ECG performed in the office today which I ordered and personally interpreted demonstrates atrial fibrillation, heart rate 63 bpm.   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  Review of Systems: As per "subjective", otherwise negative.  Allergies  Allergen Reactions  . Morphine Other (See Comments)    REACTION: made him go crazy; "I had fun w/it; my family didn't think it was too funny"  . Metoprolol Other (See Comments)    REACTION:  Tachycardia; "don't remember how bad it was; it was so long ago"  . Rifampin     Itch     Current Outpatient Medications  Medication Sig Dispense Refill  . acetaminophen (TYLENOL) 325 MG tablet Take 1-2 tablets (325-650 mg total) by mouth every 4 (four) hours as needed for mild  pain.    Marland Kitchen amLODipine (NORVASC) 5 MG tablet Take 1 tablet (5 mg total) by mouth daily. 90 tablet 3  . aspirin EC 81 MG tablet Take 81 mg by mouth daily.    Marland Kitchen atorvastatin (LIPITOR) 40 MG tablet TAKE 1 TABLET BY MOUTH EVERY DAY 30 tablet 6  . carvedilol (COREG) 25 MG tablet Take 1 tablet (25 mg total) by mouth 2 (two) times daily with a meal. (Patient taking differently: Take 25 mg by mouth daily. ) 60 tablet 12  . docusate sodium (COLACE) 100 MG capsule Take 100 mg by mouth daily.    Marland Kitchen doxazosin (CARDURA) 8 MG tablet Take 8 mg by mouth daily.    . finasteride (PROSCAR) 5 MG tablet Take 5 mg by mouth daily.    Marland Kitchen HYDROcodone-acetaminophen (NORCO) 10-325 MG tablet TK 1 T PO QID  0  . Multiple Vitamin (MULTIVITAMIN) tablet Take 1 tablet by mouth daily.    . nitroGLYCERIN (NITROSTAT) 0.4 MG SL tablet Place 0.4 mg under the tongue every 5 (five) minutes as needed for chest pain. May take up to 3 doses per episode. Contact MD or 911 if unresolved chest pain continues.    . predniSONE (DELTASONE) 10 MG tablet Take 5-10 mg by mouth as directed.  1  . tamsulosin (FLOMAX) 0.4 MG CAPS capsule Take 0.4 mg by mouth.     No current facility-administered medications for this visit.     Past Medical History:  Diagnosis Date  . Acute renal failure (HCC) 04/17/2013  . Anemia, iron deficiency  09/09/2013  . Anxiety   . Arthritis   . 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 Coronary artery disease    IN 2000   STENT PLACED IN 2012 sees Dr. Dietrich Pates, saw last 2013  . Disorder of blood    BEEN TREATED BY DERMATOLOGIST X 4 YRS..."BLOOD BLISTERS"  . Gout   . HTN (hypertension)    sees [name] in [place]  . Hx MRSA infection    rt shoulder  . Hyperlipidemia   . Pneumonia    "I've had it 3-4 times"  . Small bowel obstruction (HCC)   . Small bowel problem    HAD ALOT OF SCAR TISSUE FROM PREVIOUS SURGERIES..NG WAS INSERTED ...Marland KitchenMarland KitchenNO SURGERY NEEDED...Marland KitchenMarland KitchenIN FOR 8 DAYS  . Ventral hernia      Past Surgical History:  Procedure Laterality Date  . ANTERIOR CERVICAL CORPECTOMY  12/17/11  . ANTERIOR CERVICAL CORPECTOMY  12/17/2011   Procedure: ANTERIOR CERVICAL CORPECTOMY;  Surgeon: Karn Cassis, MD;  Location: [facility];  Service: Neurosurgery;  Laterality: N/A;  Anterior Cervical Decompression Fusion Five to Thoracic Two with plating  . BACK SURGERY     lumbar  . CATARACT EXTRACTION W/ INTRAOCULAR LENS  IMPLANT, BILATERAL  ? 2011  . CHOLECYSTECTOMY  2006 "or after"  . CORONARY ANGIOPLASTY WITH STENT PLACEMENT  2012  . CORONARY ARTERY BYPASS GRAFT  2000   CABG X5  . EYE SURGERY     bilateral cataract  . INCISION AND DRAINAGE OF WOUND  ~ 20ll; 12/03/11   "had infection in my right"  . LUMBAR LAMINECTOMY/DECOMPRESSION MICRODISCECTOMY  06/13/2011   Procedure: LUMBAR LAMINECTOMY/DECOMPRESSION MICRODISCECTOMY;  Surgeon: Karn Cassis;  Location: [facility];  Service: Neurosurgery;  Laterality: N/A;  Lumbar three, lumbar four-five Laminectomy  . LUMBAR LAMINECTOMY/DECOMPRESSION MICRODISCECTOMY Right 06/17/2013   Procedure: LUMBAR LAMINECTOMY/DECOMPRESSION MICRODISCECTOMY 1 LEVEL;  Surgeon: Karn Cassis, MD;  Location: [facility];  Service: Neurosurgery;  Laterality: Right;  Right L3-4 Microdiskectomy  . LUMBAR WOUND DEBRIDEMENT N/A 08/02/2013   Procedure: INCISION AND DRAINAGE OF LUMBAR WOUND DEBRIDEMENT;  Surgeon: Karn Cassis, MD;  Location: [facility];  Service: Neurosurgery;  Laterality: N/A;  . PERIPHERALLY INSERTED CENTRAL CATHETER INSERTION  2011 & 11/2011  . SHOULDER ARTHROSCOPY Left 09/13/2013   Procedure: ARTHROSCOPIC IRRIGATION AND DEBRIDEMENT, SYNOVECTOMY ,  LEFT SHOULDER ;  Surgeon: Thera Flake., MD;  Location: [facility];  Service: Orthopedics;  Laterality: Left;  . SHOULDER OPEN ROTATOR CUFF REPAIR  ~ 2011   right  . STERNAL SURG  2000   HAS HAD 5-6 ON HIS STERNUM; "caught MRSA in it"  . TONSILLECTOMY  1949    Social History   Socioeconomic  History  . Marital status: Married    Spouse name: Not on file  . Number of children: Not on file  . Years of education: Not on file  . Highest education level: Not on file  Occupational History  . Not on file  Social Needs  . Financial resource strain: Not on file  . Food insecurity:    Worry: Not on file    Inability: Not on file  . Transportation needs:    Medical: Not on file    Non-medical: Not on file  Tobacco Use  . Smoking status: Former Smoker    Packs/day: 1.00    Years: 1.00    Pack years: 1.00    Types: Cigarettes    Start date: 12/29/1959  Last attempt to quit: 12/27/1998    Years since quitting: 20.0  . Smokeless tobacco: Never Used  Substance and Sexual Activity  . Alcohol use: No    Alcohol/week: 0.0 standard drinks  . Drug use: No  . Sexual activity: Not Currently  Lifestyle  . Physical activity:    Days per week: Not on file    Minutes per session: Not on file  . Stress: Not on file  Relationships  . Social connections:    Talks on phone: Not on file    Gets together: Not on file    Attends religious service: Not on file    Active member of club or organization: Not on file    Attends meetings of clubs or organizations: Not on file    Relationship status: Not on file  . Intimate partner violence:    Fear of current or ex partner: Not on file    Emotionally abused: Not on file    Physically abused: Not on file    Forced sexual activity: Not on file  Other Topics Concern  . Not on file  Social History Narrative  . Not on file     Vitals:   12/23/18 1000  BP: (!) 152/59  Pulse: 78  Temp: 98.8 F (37.1 C)  TempSrc: Temporal  SpO2: 94%  Weight: 162 lb (73.5 kg)  Height: 6' (1.829 m)    Wt Readings from Last 3 Encounters:  12/23/18 162 lb (73.5 kg)  02/24/18 170 lb 9.6 oz (77.4 kg)  02/20/17 169 lb (76.7 kg)     PHYSICAL EXAM General: NAD HEENT: Normal. Neck: No JVD, no thyromegaly. Lungs: Clear to auscultation bilaterally with  normal respiratory effort. CV: Regular rate and irregular rhythm, normal S1/S2, no S3, no murmur. No pretibial or periankle edema.     Abdomen: Soft, nontender, no distention.  Neurologic: Alert and oriented.  Psych: Normal affect. Skin: Normal. Musculoskeletal: No gross deformities.    ECG: Reviewed above under Subjective   Labs: Lab Results  Component Value Date/Time   K 4.8 01/20/2017 04:30 PM   BUN 28 (H) 01/20/2017 04:30 PM   CREATININE 1.29 (H) 01/20/2017 04:30 PM   ALT 11 09/05/2013 12:52 PM   TSH 2.362 09/07/2013 03:47 PM   HGB 13.4 01/20/2017 04:30 PM     Lipids: Lab Results  Component Value Date/Time   LDLCALC 69 02/26/2018 07:43 AM   CHOL 143 02/26/2018 07:43 AM   TRIG 152 (H) 02/26/2018 07:43 AM   HDL 51 02/26/2018 07:43 AM       ASSESSMENT AND PLAN: 1.  Coronary disease with a history of CABG: Normal nuclear stress test on 02/13/2017.  Left ventricular systolic function is normal.  Symptomatically stable overall.  Continue Lipitor and carvedilol (I encouraged him to take 12.5 mg twice daily).  No aspirin as I am starting systemic anticoagulation for new onset atrial fibrillation.  2.  Chronic diastolic heart failure: LVEF 50 to 55%.  Appears compensated with no need for a diuretic.  Blood pressure is mildly elevated.  He will begin amlodipine 5 mg daily today and I instructed him to take carvedilol 12.5 mg twice daily rather than daily.  3.  Hypertension: Blood pressure is mildly elevated but improving.  He will begin amlodipine 5 mg daily today.  He also takes carvedilol 25 mg daily as per his preference along with Cardura 8 mg daily.  I instructed him to take carvedilol 12.5 mg twice daily.  4.  Hyperlipidemia:  LDL 69 on 03/15/18.  Continue Lipitor.  5.  New onset atrial fibrillation: He is asymptomatic from the standpoint.  I will discontinue aspirin and will plan to start Eliquis 5 mg twice daily.  I will first obtain a basic metabolic panel and CBC.  I  will also obtain a follow-up echocardiogram to assess for interval changes in cardiac structure and function.   Disposition: Follow up 3 months   Prentice Docker, M.D., F.A.C.C.  Time spent: 40 minutes, of which greater than 50% was spent reviewing symptoms, relevant blood tests and studies, and discussing management plan with the patient.

## 2018-12-23 NOTE — Addendum Note (Signed)
Addended by: Marlyn Corporal A on: 12/23/2018 10:57 AM   Modules accepted: Orders

## 2018-12-24 ENCOUNTER — Ambulatory Visit (HOSPITAL_COMMUNITY): Payer: Medicare Other | Attending: General Practice

## 2018-12-24 ENCOUNTER — Encounter (HOSPITAL_COMMUNITY): Payer: Self-pay

## 2018-12-24 ENCOUNTER — Other Ambulatory Visit: Payer: Self-pay

## 2018-12-24 DIAGNOSIS — M6281 Muscle weakness (generalized): Secondary | ICD-10-CM

## 2018-12-24 NOTE — Therapy (Signed)
[facility] [address] [place], [place], [address] Phone: [phone]   Fax:  [phone]  Physical Therapy Evaluation/Cancellation  Patient Details  Name: [name] MRN: [id number] Date of Birth: [birth year] Referring Provider (PT): Laverna Peace, MD   Encounter Date: 12/24/2018  PT End of Session - 12/24/18 0845    Visit Number  1    Number of Visits  1    Authorization Type  Medicare Part A & B    Authorization Time Period  12/24/18    PT Start Time  0816    PT Stop Time  0830    PT Time Calculation (min)  14 min    Equipment Utilized During Treatment  Gait belt    Activity Tolerance  Other (comment)   pt felt limited by mask and did not feel he could fully participate in therapy if he has to wear it.    Behavior During Therapy  [facility] for tasks assessed/performed       Past Medical History:  Diagnosis Date  . Acute renal failure (HCC) 04/17/2013  . Anemia, iron deficiency 09/09/2013  . Anxiety   . Arthritis   . 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 Coronary artery disease    IN 2000   STENT PLACED IN 2012 sees Dr. Dietrich Pates, saw last 2013  . Disorder of blood    BEEN TREATED BY DERMATOLOGIST X 4 YRS..."BLOOD BLISTERS"  . Gout   . HTN (hypertension)    sees [name] in [place]  . Hx MRSA infection    rt shoulder  . Hyperlipidemia   . Pneumonia    "I've had it 3-4 times"  . Small bowel obstruction (HCC)   . Small bowel problem    HAD ALOT OF SCAR TISSUE FROM PREVIOUS SURGERIES..NG WAS INSERTED ...Marland KitchenMarland KitchenNO SURGERY NEEDED...Marland KitchenMarland KitchenIN FOR 8 DAYS  . Ventral hernia     Past Surgical History:  Procedure Laterality Date  . ANTERIOR CERVICAL CORPECTOMY  12/17/11  . ANTERIOR CERVICAL CORPECTOMY  12/17/2011   Procedure: ANTERIOR CERVICAL CORPECTOMY;  Surgeon: Karn Cassis, MD;  Location: [facility];  Service: Neurosurgery;  Laterality: N/A;  Anterior Cervical Decompression Fusion Five to Thoracic Two  with plating  . BACK SURGERY     lumbar  . CATARACT EXTRACTION W/ INTRAOCULAR LENS  IMPLANT, BILATERAL  ? 2011  . CHOLECYSTECTOMY  2006 "or after"  . CORONARY ANGIOPLASTY WITH STENT PLACEMENT  2012  . CORONARY ARTERY BYPASS GRAFT  2000   CABG X5  . EYE SURGERY     bilateral cataract  . INCISION AND DRAINAGE OF WOUND  ~ 20ll; 12/03/11   "had infection in my right"  . LUMBAR LAMINECTOMY/DECOMPRESSION MICRODISCECTOMY  06/13/2011   Procedure: LUMBAR LAMINECTOMY/DECOMPRESSION MICRODISCECTOMY;  Surgeon: Karn Cassis;  Location: [facility];  Service: Neurosurgery;  Laterality: N/A;  Lumbar three, lumbar four-five Laminectomy  . LUMBAR LAMINECTOMY/DECOMPRESSION MICRODISCECTOMY Right 06/17/2013   Procedure: LUMBAR LAMINECTOMY/DECOMPRESSION MICRODISCECTOMY 1 LEVEL;  Surgeon: Karn Cassis, MD;  Location: [facility];  Service: Neurosurgery;  Laterality: Right;  Right L3-4 Microdiskectomy  . LUMBAR WOUND DEBRIDEMENT N/A 08/02/2013   Procedure: INCISION AND DRAINAGE OF LUMBAR WOUND DEBRIDEMENT;  Surgeon: Karn Cassis, MD;  Location: [facility];  Service: Neurosurgery;  Laterality: N/A;  . PERIPHERALLY INSERTED CENTRAL CATHETER INSERTION  2011 & 11/2011  . SHOULDER ARTHROSCOPY Left 09/13/2013   Procedure: ARTHROSCOPIC IRRIGATION AND DEBRIDEMENT, SYNOVECTOMY ,  LEFT SHOULDER ;  Surgeon: Thera Flake., MD;  Location: [facility] OR;  Service: Orthopedics;  Laterality: Left;  . SHOULDER OPEN ROTATOR CUFF REPAIR  ~ 2011   right  . STERNAL SURG  2000   HAS HAD 5-6 ON HIS STERNUM; "caught MRSA in it"  . TONSILLECTOMY  1949    There were no vitals filed for this visit.   Subjective Assessment - 12/24/18 0818    Subjective  Patient reports during the last few months with stay at home orders he has lost a lot of strength. He reports he believes he has lost ~ 15lbs and feels his UE's are much weaker. He also is not able to walk far and doing his grocery shopping, he can no longer use the buggy to shop  with and has to use a scooter. Pt reports at home he still uses a wheelchair for mobility.    Currently in Pain?  No/denies         [facility] PT Assessment - 12/24/18 0001      Assessment   Medical Diagnosis  Generalized Weakness    Referring Provider (PT)  Dabbs, Aram Candela, MD    Prior Therapy  yes several times in our clinic      Precautions   Precautions  None    Required Braces or Orthoses  Other Brace/Splint      Restrictions   Weight Bearing Restrictions  No      Balance Screen   Has the patient fallen in the past 6 months  No    Has the patient had a decrease in activity level because of a fear of falling?   Yes    Is the patient reluctant to leave their home because of a fear of falling?   No      Home Environment   Living Environment  Private residence    Living Arrangements  Spouse/significant other    Available Help at Discharge  Family;Neighbor    Type of Home  House    Home Access  Level entry;Ramped entrance    Home Layout  One level    Home Equipment  Wheelchair - manual;Walker - 4 wheels;Walker - 2 wheels;Tub bench;Grab bars - toilet;Grab bars - tub/shower      Prior Function   Level of Independence  Independent with community mobility with device;Independent with household mobility with device    Vocation  Retired    Leisure  pt enjoys going to the gym during the week and 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 [employer]   Overall Cognitive Status  Within Functional Limits for tasks assessed      Observation/Other Assessments   Focus on Therapeutic Outcomes (FOTO)   NA      Sensation   Additional Comments  pt conitnues to have tingling in Lt LE and difficulty controlling Rt foot placement      Ambulation/Gait   Ambulation/Gait  Yes    Ambulation/Gait Assistance  6: Modified independent (Device/Increase time)    Ambulation Distance (Feet)  216 Feet     Assistive device  4-wheeled walker    Gait Pattern  Step-through  pattern;Trunk flexed;Ataxic    Ambulation Surface  Level;Indoor    Gait velocity  0.54 m/s         Objective measurements completed on examination: See above findings.     PT Education - 12/24/18 0844    Education Details  Educated on purpose of mask for pt's/and others protection. Educated on alternatives to therapy wtih virtual telehealth visits or homehelaht and he declined.    Person(s) Educated  Patient    Methods  Explanation    Comprehension  Verbalized understanding        Plan - 12/24/18 0847    Clinical Impression Statement  [name] is returning to physical therapy for evaluation related to general decrease in strength and mobility. Evaluation began with subjective report and objective testing initiated with . At 2 minutes patient reported he wanted to stop as it was diffiuclt to breathe through the mask. He stated he did not want to wear the mask because he didn't feel he would be able to complete enough exercises while wearing it. He stated he felt he woul donly be able to perform seated or supine exercises and does not feel that is an effective use of therapy for him. This therapist explained the mask is neccessary for his and everyones protection at this time and that we could set him up with virtual/telehealth visits or with home health if he would prefer. He stated he would not like to participate in those and will wait to return to the gym once it re-opens. The evaluation was ended and patient declined further skilled physical therapy.     Stability/Clinical Decision Making  Stable/Uncomplicated    PT Frequency  One time visit    Consulted and Agree with Plan of Care  Patient       Patient will benefit from skilled therapeutic intervention in order to improve the following deficits and impairments:  Abnormal gait, Decreased balance, Difficulty walking, Impaired tone, Decreased activity tolerance, Decreased strength, Postural dysfunction, Impaired sensation,  Decreased mobility, Decreased endurance  Visit Diagnosis: Muscle weakness (generalized)     Problem List Patient Active Problem List   Diagnosis Date Noted  . 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 Infection of urinary tract 10/26/2015  . Intractable back pain 10/22/2015  . Back pain 10/22/2015  . Muscle weakness (generalized) 12/05/2013  . Tight fascia 12/05/2013  . Decreased range of motion of right shoulder 12/05/2013  . Decreased range of motion of left shoulder 12/05/2013  . Bilateral leg weakness 11/21/2013  . Poor balance 11/21/2013  . Difficulty in walking(719.7) 11/21/2013  . Shoulder pain, left 09/12/2013  . Anemia, iron deficiency 09/09/2013  . Dyspnea 09/07/2013  . UTI (urinary tract infection) 09/07/2013  . Acute CHF (congestive heart failure) (HCC) 09/07/2013  . Elevated troponin 09/07/2013  . Fever and chills 09/06/2013  . Altered mental status 09/05/2013  . Neurogenic bowel 08/22/2013  . 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 Gout flare 08/22/2013  . Postoperative wound infection 08/22/2013  . Lumbar degenerative disc disease 08/02/2013  . Post-operative pain 07/29/2013  . Osteonecrosis (HCC) 05/05/2013  . Small bowel obstruction (HCC) 04/17/2013  . Acute renal failure (HCC) 04/17/2013  . Medial meniscus, posterior horn derangement 02/17/2013  . Knee sprain and strain 02/17/2013  . Trigger point with neck pain 03/18/2012  . Rotator cuff tear arthropathy of right shoulder 09/08/2011  . CAD in native artery 01/09/2011  . Ankle fracture 12/25/2010  . Contusion of shoulder, left 12/25/2010  . PEMPHIGUS VULGARIS 07/02/2010  . MRSA 05/13/2010  . PRURITUS 05/13/2010  . SPINAL STENOSIS, LUMBAR 05/13/2010  . HLD (hyperlipidemia) 04/24/2010  . GASTROESOPHAGEAL REFLUX DISEASE 04/24/2010  . VENTRAL HERNIA, INCISIONAL 04/24/2010  . DEGENERATIVE JOINT DISEASE 04/24/2010  . PYOGENIC ARTHRITIS,  SHOULDER REGION 02/04/2010   . Essential hypertension 01/01/2010  . RUPTURE ROTATOR CUFF 02/19/2009    Valentino Saxonachel Quinn-Brown, PT, DPT, [facility]kaWTA Physical Therapist with [facility]  12/24/2018 8:59 AM    [facility] [address] [place], [place], [address] Phone: [phone]   Fax:  [phone]  Name: Jake MarvelRonald W [name] MRN: [id number] Date of Birth: 12/03/1940

## 2018-12-27 ENCOUNTER — Other Ambulatory Visit: Payer: Self-pay

## 2018-12-27 ENCOUNTER — Ambulatory Visit (HOSPITAL_COMMUNITY)
Admission: RE | Admit: 2018-12-27 | Discharge: 2018-12-27 | Disposition: A | Discharge status: Home / Self Care | Payer: Medicare Other | Source: Ambulatory Visit | Attending: Cardiovascular Disease | Admitting: Cardiovascular Disease

## 2018-12-27 DIAGNOSIS — R338 Other retention of urine: Secondary | ICD-10-CM | POA: Diagnosis not present

## 2018-12-27 DIAGNOSIS — I4891 Unspecified atrial fibrillation: Secondary | ICD-10-CM

## 2018-12-27 NOTE — Progress Notes (Signed)
*  PRELIMINARY RESULTS* Echocardiogram 2D Echocardiogram has been performed.  Stacey Drain 12/27/2018, 12:24 PM

## 2018-12-27 NOTE — Progress Notes (Signed)
[facility] Note  12/28/2018     CHIEF COMPLAINT Patient presents for Retina Follow Up   HISTORY OF PRESENT ILLNESS: [name] is a 78 y.o. male who presents to the clinic today for:   HPI    Retina Follow Up    Patient presents with  Other.  In right eye.  This started 17 months ago.  Severity is moderate.  I, the attending physician,  performed the HPI with the patient and updated documentation appropriately.          Comments    Patient here for f/u POHS with mac scar OD last exam 08-11-17. Patient states vision is the same. Has a little pain OD.        Last edited by Bernarda Caffey, MD on 12/28/2018 10:25 AM. (History)    pt states he came back in for f/u bc he has been having throbbing pain behind his right eye, he states he found out he had high blood pressure (200/100) and finally got in to see a dr and got some medication, he states he had an ultrasound on his heart yesterday and everything was okay, he states they put him on a "high powered" blood thinner, but it is causing him to itch so he would like to come off of it  Referring physician: Sinda Du, MD [place], [place] [address]  HISTORICAL INFORMATION:   Selected notes from the MEDICAL RECORD NUMBER Referral from Dr. 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 (Ophthalmic Drugs)   No current facility-administered medications for this visit.  (Ophthalmic Drugs)   Current Outpatient Medications (Other)  Medication Sig  . acetaminophen (TYLENOL) 325 MG tablet Take 1-2 tablets (325-650 mg total) by mouth every 4 (four) hours as needed for mild pain.  Marland Kitchen amLODipine (NORVASC) 5 MG tablet Take 1 tablet (5 mg total)  by mouth daily.  Marland Kitchen apixaban (ELIQUIS) 5 MG TABS tablet Take 1 tablet (5 mg total) by mouth 2 (two) times daily.  Marland Kitchen atorvastatin (LIPITOR) 40 MG tablet TAKE 1 TABLET BY MOUTH EVERY DAY  . carvedilol (COREG) 12.5 MG tablet Take 1 tablet (12.5 mg total) by mouth 2 (two) times daily.  Marland Kitchen docusate sodium (COLACE) 100 MG capsule Take 100 mg by mouth daily.  Marland Kitchen doxazosin (CARDURA) 8 MG tablet Take 8 mg by mouth daily.  . finasteride (PROSCAR) 5 MG tablet Take 5 mg by mouth daily.  Marland Kitchen HYDROcodone-acetaminophen (NORCO) 10-325 MG tablet TK 1 T PO QID  . Multiple Vitamin (MULTIVITAMIN) tablet Take 1 tablet by mouth daily.  . nitroGLYCERIN (NITROSTAT) 0.4 MG SL tablet Place 0.4 mg under the tongue every 5 (five) minutes as needed for chest pain. May take up to 3 doses per episode. Contact MD or 911 if unresolved chest pain continues.  . predniSONE (DELTASONE) 10 MG tablet Take 5-10 mg by mouth as directed.  . tamsulosin (FLOMAX) 0.4 MG CAPS capsule Take 0.4 mg by mouth.   No current facility-administered medications for this visit.  (Other)      REVIEW OF SYSTEMS: ROS    Positive for: Gastrointestinal, Neurological, Genitourinary, Musculoskeletal, HENT, Cardiovascular, Eyes, Respiratory, Psychiatric, Allergic/Imm, Heme/Lymph   Negative for: Constitutional, Skin   Last edited  by Theodore Demark on 12/28/2018  9:54 AM. (History)       ALLERGIES Allergies  Allergen Reactions  . Morphine Other (See Comments)    REACTION: made him go crazy; "I had fun w/it; my family didn't think it was too funny"  . Metoprolol Other (See Comments)    REACTION:  Tachycardia; "don't remember how bad it was; it was so long ago"  . Rifampin     Itch     PAST MEDICAL HISTORY Past Medical History:  Diagnosis Date  . Acute renal failure ([facility]) 04/17/2013  . Anemia, iron deficiency 09/09/2013  . Anxiety   . Arthritis   . 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 Coronary artery disease    IN 2000    STENT PLACED IN 2012 sees Dr. Lattie Haw, saw last 2013  . Disorder of blood    BEEN TREATED BY DERMATOLOGIST X 4 YRS..."BLOOD BLISTERS"  . Gout   . HTN (hypertension)    sees Dr. [name] in [place]  . Hx MRSA infection    rt shoulder  . Hyperlipidemia   . Pneumonia    "I've had it 3-4 times"  . Small bowel obstruction ([facility])   . Small bowel problem    HAD ALOT OF SCAR TISSUE FROM PREVIOUS SURGERIES..NG WAS INSERTED ...Marland KitchenMarland KitchenNO SURGERY NEEDED...Marland KitchenMarland KitchenIN FOR 8 DAYS  . Ventral hernia    Past Surgical History:  Procedure Laterality Date  . ANTERIOR CERVICAL CORPECTOMY  12/17/11  . ANTERIOR CERVICAL CORPECTOMY  12/17/2011   Procedure: ANTERIOR CERVICAL CORPECTOMY;  Surgeon: Floyce Stakes, MD;  Location: [facility];  Service: Neurosurgery;  Laterality: N/A;  Anterior Cervical Decompression Fusion Five to Thoracic Two with plating  . BACK SURGERY     lumbar  . CATARACT EXTRACTION W/ INTRAOCULAR LENS  IMPLANT, BILATERAL  ? 2011  . CHOLECYSTECTOMY  2006 "or after"  . CORONARY ANGIOPLASTY WITH STENT PLACEMENT  2012  . CORONARY ARTERY BYPASS GRAFT  2000   CABG X5  . EYE SURGERY     bilateral cataract  . INCISION AND DRAINAGE OF WOUND  ~ 20ll; 12/03/11   "had infection in my right"  . LUMBAR LAMINECTOMY/DECOMPRESSION MICRODISCECTOMY  06/13/2011   Procedure: LUMBAR LAMINECTOMY/DECOMPRESSION MICRODISCECTOMY;  Surgeon: Floyce Stakes;  Location: [facility];  Service: Neurosurgery;  Laterality: N/A;  Lumbar three, lumbar four-five Laminectomy  . LUMBAR LAMINECTOMY/DECOMPRESSION MICRODISCECTOMY Right 06/17/2013   Procedure: LUMBAR LAMINECTOMY/DECOMPRESSION MICRODISCECTOMY 1 LEVEL;  Surgeon: Floyce Stakes, MD;  Location: [facility];  Service: Neurosurgery;  Laterality: Right;  Right L3-4 Microdiskectomy  . LUMBAR WOUND DEBRIDEMENT N/A 08/02/2013   Procedure: INCISION AND DRAINAGE OF LUMBAR WOUND DEBRIDEMENT;  Surgeon: Floyce Stakes, MD;  Location: [facility];  Service: Neurosurgery;   Laterality: N/A;  . [place] CATHETER INSERTION  2011 & 11/2011  . SHOULDER ARTHROSCOPY Left 09/13/2013   Procedure: ARTHROSCOPIC IRRIGATION AND DEBRIDEMENT, SYNOVECTOMY ,  LEFT SHOULDER ;  Surgeon: Yvette Rack., MD;  Location: [facility];  Service: Orthopedics;  Laterality: Left;  . SHOULDER OPEN ROTATOR CUFF REPAIR  ~ 2011   right  . STERNAL SURG  2000   HAS HAD 5-6 ON HIS STERNUM; "caught MRSA in it"  . TONSILLECTOMY  1949    FAMILY HISTORY Family History  Problem Relation Age of Onset  . Heart disease Other   . Hypertension Mother   . Hypertension Maternal Aunt   . Hypertension Maternal Uncle   .  Anesthesia problems Neg Hx   . Hypotension Neg Hx   . Malignant hyperthermia Neg Hx   . Pseudochol deficiency Neg Hx     SOCIAL HISTORY Social History   Tobacco Use  . Smoking status: Former Smoker    Packs/day: 1.00    Years: 1.00    Pack years: 1.00    Types: Cigarettes    Start date: 12/29/1959    Last attempt to quit: 12/27/1998    Years since quitting: 20.0  . Smokeless tobacco: Never Used  Substance Use Topics  . Alcohol use: No    Alcohol/week: 0.0 standard drinks  . Drug use: No         OPHTHALMIC EXAM:  Base Eye Exam    Visual Acuity (Snellen - Linear)      Right Left   Dist cc 20/150 +2 20/25 +2   Dist ph cc NI    Correction:  Glasses       Tonometry (Tonopen, 9:50 AM)      Right Left   Pressure 15 14       Pupils      Dark Light Shape React APD   Right 3 2 Round Minimal None   Left 3 2 Round Minimal None       Visual Fields (Counting fingers)      Left Right    Full    Restrictions  Partial outer inferior nasal deficiency       Extraocular Movement      Right Left    Full, Ortho Full, Ortho       Neuro/Psych    Oriented x3:  Yes   Mood/Affect:  Normal       Dilation    Both eyes:  1.0% Mydriacyl, 2.5% Phenylephrine @ 9:50 AM        Slit Lamp and Fundus Exam    External Exam      Right Left   External Brow  ptosis Brow ptosis       Slit Lamp Exam      Right Left   Lids/Lashes Dermatochalasis - upper lid Dermatochalasis - upper lid   Conjunctiva/Sclera White and quiet White and quiet   Cornea Arcus, 1+ Punctate epithelial erosions Arcus, 1+ Punctate epithelial erosions   Anterior Chamber Deep and quiet, no cell or flare Deep and quiet   Iris Round and moderate dilation to 5.7 mm; iridodenisis Round dilated to 7 mm; iridodenesis;   Lens Posterior chamber intraocular lens - displaced nasally 1/3 to 1/2 pupil and tilted, pseudophakodensis - stable Posterior chamber intraocular lens - in good postion; pseudophakodenesis; Posterior capsular opacification - trace   Vitreous Vitreous syneresis, Posterior vitreous detachment; debris Vitreous syneresis, Posterior vitreous detachment       Fundus Exam      Right Left   Disc +cupping, +pallor, Peripapillary atrophy 360 360Peripapillary atrophy, mild pallor, mild tilt   C/D Ratio 0.5 0.5   Macula Large chorioretinal scar abutting fovea; 2 small histospots - inferior macula; confluent patches of CR scarring inferiorly; no heme or CNVM Flat, good foveal reflex, drusen, mild ERM; scattered small histo spots; small RPE changes inferiorly;    Vessels Normal Normal   Periphery Attached; scattered pigmented CR scars 360 Attached; Scattered pigmented CR scarring 360          IMAGING AND PROCEDURES  Imaging and Procedures for 08/11/17  OCT, Retina - OU - Both Eyes       Right Eye Quality was good. Central Foveal  Thickness: 282. Progression has been stable. Findings include abnormal foveal contour, outer retinal atrophy, no IRF, no SRF, outer retinal tubulation, epiretinal membrane, subretinal hyper-reflective material, intraretinal hyper-reflective material (IRHM; thin choroid;).   Left Eye Quality was good. Central Foveal Thickness: 299. Progression has been stable. Findings include normal foveal contour, no IRF, no SRF, epiretinal membrane, outer  retinal atrophy (Thin choroid; scattered outer retinal atrophy).   Notes Images taken, stored on drive  Diagnosis / Impression:  OD: large macular scar with ERM, outer retinal atrophy, outer retinal tubulation -- stable from prior OS: ERM, scattered outer retinal atrophy -- no interval change  Clinical management:  See below  Abbreviations: NFP - Normal foveal profile. CME - cystoid macular edema. PED - pigment epithelial detachment. IRF - intraretinal fluid. SRF - subretinal fluid. EZ - ellipsoid zone. ERM - epiretinal membrane. ORA - outer retinal atrophy. ORT - outer retinal tubulation. SRHM - subretinal hyper-reflective material                  ASSESSMENT/PLAN:    ICD-10-CM   1. 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 Pseudophakia of both eyes Z96.1   4. 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 Posterior vitreous detachment of both eyes H43.813   7. Retinal edema H35.81 OCT, Retina - OU - Both Eyes    1,2. POHS OU with macular scar OD  - diagnosed ~35 yrs ago with history of macular laser OD  - large macular scar OD involving fovea -- reports history of low vision OD since receiving laser  - stable today with no active heme or CNVM noted -- no change from Jan 2019  - will continue to monitor  - f/u in 1 year  3, 4, 5. Pseudophakia OU w/ subluxed IOL OD, pseudophakodonesis OS  - discussed findings and prognosis, and treatment options for dislocated IOL including observation, pars plana vitrectomy, lens explantation, lens implantation in either the anterior chamber or scleral fixated intraocular lens.   - Discussed risks of surgery including but not limited to bleeding, infection, corneal swelling, retinal tear, detachment, loss of vision, loss of eye.  - re-discussed likely limited visual benefit of surgery given extensive macular scar  - IOP okay and no  UGH syndrome at this point  - pt wishes to observe for now given likely limited visual benefit and his medical comorbidities  - discussed s/s of complete IOL dislocation and possible need for surgery should IOL displace posteriorly  - stable since Jan 2019  - advised limiting activities to prevent complete IOL dislocation OD  - will f/u in 1 year, sooner prn  6. PVD OU  - vitreous clear with debris likely from IOL dislocation in addition to PVD/vitreous syneresis -- no vitritis  - Discussed findings and prognosis  - No RT or RD on 360 exam  - Reviewed s/s of RT/RD  - Strict return precautions for any such RT/RD signs/symptoms  7. No retinal edema on exam or OCT    Ophthalmic Meds Ordered this visit:  No orders of the defined types were placed in this encounter.      Return in about 1 year (around 12/28/2019) for f/u POHS OU, DFE, OCT.  There are no Patient Instructions on file for this visit.   Explained the diagnoses, plan, and follow up with the patient and they expressed  understanding.  Patient expressed understanding of the importance of proper follow up care.   This document serves as a record of services personally performed by Gardiner Sleeper, MD, PhD. It was created on their behalf by Ernest Mallick, OA, an ophthalmic assistant. The creation of this record is the provider's dictation and/or activities during the visit.    Electronically signed by: Ernest Mallick, OA  06.01.2020 1:41 PM   Gardiner Sleeper, M.D., Ph.D. Vitreoretinal Surgeon [facility]  I have reviewed the above documentation for accuracy and completeness, and I agree with the above. Gardiner Sleeper, M.D., Ph.D. 12/28/18 1:41 PM   Abbreviations: M myopia (nearsighted); A astigmatism; H hyperopia (farsighted); P presbyopia; Mrx spectacle prescription;  CTL contact lenses; OD right eye; OS left eye; OU both eyes  XT exotropia; ET esotropia; PEK punctate epithelial keratitis; PEE punctate  epithelial erosions; DES dry eye syndrome; MGD meibomian gland dysfunction; ATs artificial tears; PFAT's preservative free artificial tears; [place] nuclear sclerotic cataract; PSC posterior subcapsular cataract; ERM epi-retinal membrane; PVD posterior vitreous detachment; RD retinal detachment; DM diabetes mellitus; DR diabetic retinopathy; NPDR non-proliferative diabetic retinopathy; PDR proliferative diabetic retinopathy; CSME clinically significant macular edema; DME diabetic macular edema; dbh dot blot hemorrhages; CWS cotton wool spot; POAG primary open angle glaucoma; C/D cup-to-disc ratio; HVF humphrey visual field; GVF goldmann visual field; OCT optical coherence tomography; IOP intraocular pressure; BRVO Branch retinal vein occlusion; CRVO central retinal vein occlusion; CRAO central retinal artery occlusion; BRAO branch retinal artery occlusion; RT retinal tear; SB scleral buckle; PPV pars plana vitrectomy; VH Vitreous hemorrhage; PRP panretinal laser photocoagulation; IVK intravitreal kenalog; VMT vitreomacular traction; MH Macular hole;  NVD neovascularization of the disc; NVE neovascularization elsewhere; AREDS age related eye disease study; ARMD age related macular degeneration; POAG primary open angle glaucoma; EBMD epithelial/anterior basement membrane dystrophy; ACIOL anterior chamber intraocular lens; IOL intraocular lens; PCIOL posterior chamber intraocular lens; Phaco/IOL phacoemulsification with intraocular lens placement; [place] photorefractive keratectomy; LASIK laser assisted in situ keratomileusis; HTN hypertension; DM diabetes mellitus; COPD chronic obstructive pulmonary disease

## 2018-12-28 ENCOUNTER — Encounter (INDEPENDENT_AMBULATORY_CARE_PROVIDER_SITE_OTHER): Payer: Self-pay | Admitting: Ophthalmology

## 2018-12-28 ENCOUNTER — Other Ambulatory Visit: Payer: Self-pay

## 2018-12-28 ENCOUNTER — Ambulatory Visit (INDEPENDENT_AMBULATORY_CARE_PROVIDER_SITE_OTHER): Payer: Medicare Other | Admitting: Ophthalmology

## 2018-12-28 DIAGNOSIS — H278 Other specified disorders of lens: Secondary | ICD-10-CM | POA: Diagnosis not present

## 2018-12-28 DIAGNOSIS — H3581 Retinal edema: Secondary | ICD-10-CM

## 2018-12-28 DIAGNOSIS — Z961 Presence of intraocular lens: Secondary | ICD-10-CM

## 2018-12-28 DIAGNOSIS — B399 Histoplasmosis, unspecified: Secondary | ICD-10-CM | POA: Diagnosis not present

## 2018-12-28 DIAGNOSIS — H31011 Macula scars of posterior pole (postinflammatory) (post-traumatic), right eye: Secondary | ICD-10-CM

## 2018-12-28 DIAGNOSIS — H43813 Vitreous degeneration, bilateral: Secondary | ICD-10-CM

## 2018-12-28 DIAGNOSIS — H32 Chorioretinal disorders in diseases classified elsewhere: Secondary | ICD-10-CM

## 2018-12-28 DIAGNOSIS — I4891 Unspecified atrial fibrillation: Secondary | ICD-10-CM | POA: Diagnosis not present

## 2018-12-28 DIAGNOSIS — I5032 Chronic diastolic (congestive) heart failure: Secondary | ICD-10-CM | POA: Diagnosis not present

## 2018-12-28 DIAGNOSIS — I2510 Atherosclerotic heart disease of native coronary artery without angina pectoris: Secondary | ICD-10-CM | POA: Diagnosis not present

## 2018-12-28 DIAGNOSIS — T8522XS Displacement of intraocular lens, sequela: Secondary | ICD-10-CM

## 2019-01-04 DIAGNOSIS — I5032 Chronic diastolic (congestive) heart failure: Secondary | ICD-10-CM | POA: Diagnosis not present

## 2019-01-04 DIAGNOSIS — I10 Essential (primary) hypertension: Secondary | ICD-10-CM | POA: Diagnosis not present

## 2019-01-04 DIAGNOSIS — I2510 Atherosclerotic heart disease of native coronary artery without angina pectoris: Secondary | ICD-10-CM | POA: Diagnosis not present

## 2019-01-04 DIAGNOSIS — G834 Cauda equina syndrome: Secondary | ICD-10-CM | POA: Diagnosis not present

## 2019-01-12 DIAGNOSIS — I4891 Unspecified atrial fibrillation: Secondary | ICD-10-CM | POA: Diagnosis not present

## 2019-01-12 DIAGNOSIS — I2510 Atherosclerotic heart disease of native coronary artery without angina pectoris: Secondary | ICD-10-CM | POA: Diagnosis not present

## 2019-01-12 DIAGNOSIS — I10 Essential (primary) hypertension: Secondary | ICD-10-CM | POA: Diagnosis not present

## 2019-01-12 DIAGNOSIS — I5032 Chronic diastolic (congestive) heart failure: Secondary | ICD-10-CM | POA: Diagnosis not present

## 2019-01-26 DIAGNOSIS — R338 Other retention of urine: Secondary | ICD-10-CM | POA: Diagnosis not present

## 2019-02-14 DIAGNOSIS — R338 Other retention of urine: Secondary | ICD-10-CM | POA: Diagnosis not present

## 2019-02-24 DIAGNOSIS — I10 Essential (primary) hypertension: Secondary | ICD-10-CM | POA: Diagnosis not present

## 2019-02-24 DIAGNOSIS — I2510 Atherosclerotic heart disease of native coronary artery without angina pectoris: Secondary | ICD-10-CM | POA: Diagnosis not present

## 2019-02-24 DIAGNOSIS — I482 Chronic atrial fibrillation, unspecified: Secondary | ICD-10-CM | POA: Diagnosis not present

## 2019-02-24 DIAGNOSIS — G834 Cauda equina syndrome: Secondary | ICD-10-CM | POA: Diagnosis not present

## 2019-03-09 DIAGNOSIS — R338 Other retention of urine: Secondary | ICD-10-CM | POA: Diagnosis not present

## 2019-03-30 ENCOUNTER — Ambulatory Visit: Payer: Medicare Other | Admitting: Cardiovascular Disease

## 2019-04-01 ENCOUNTER — Other Ambulatory Visit: Payer: Self-pay

## 2019-04-01 ENCOUNTER — Encounter: Payer: Self-pay | Admitting: Student

## 2019-04-01 ENCOUNTER — Ambulatory Visit: Payer: Medicare Other | Admitting: Student

## 2019-04-01 VITALS — BP 142/82 | HR 93 | Temp 98.0°F | Ht 72.0 in | Wt 157.0 lb

## 2019-04-01 DIAGNOSIS — I10 Essential (primary) hypertension: Secondary | ICD-10-CM | POA: Diagnosis not present

## 2019-04-01 DIAGNOSIS — I4819 Other persistent atrial fibrillation: Secondary | ICD-10-CM

## 2019-04-01 DIAGNOSIS — E785 Hyperlipidemia, unspecified: Secondary | ICD-10-CM | POA: Diagnosis not present

## 2019-04-01 DIAGNOSIS — R338 Other retention of urine: Secondary | ICD-10-CM | POA: Diagnosis not present

## 2019-04-01 DIAGNOSIS — I25708 Atherosclerosis of coronary artery bypass graft(s), unspecified, with other forms of angina pectoris: Secondary | ICD-10-CM | POA: Diagnosis not present

## 2019-04-01 NOTE — Patient Instructions (Signed)
Medication Instructions:  Your physician recommends that you continue on your current medications as directed. Please refer to the Current Medication list given to you today.  If you need a refill on your cardiac medications before your next appointment, please call your pharmacy.   Lab work: NONE   If you have labs (blood work) drawn today and your tests are completely normal, you will receive your results only by: Marland Kitchen MyChart Message (if you have MyChart) OR . A paper copy in the mail If you have any lab test that is abnormal or we need to change your treatment, we will call you to review the results.  Testing/Procedures: NONE   Follow-Up: At [facility], you and your health needs are our priority.  As part of our continuing mission to provide you with exceptional heart care, we have created designated Provider Care Teams.  These Care Teams include your primary Cardiologist (physician) and Advanced Practice Providers (APPs -  Physician Assistants and Nurse Practitioners) who all work together to provide you with the care you need, when you need it. You will need a follow up appointment in 6 months.  Please call our office 2 months in advance to schedule this appointment.  You may see Kate Sable, MD or one of the following Advanced Practice Providers on your designated Care Team:   [name], PA-C [facility]) . Ermalinda Barrios, PA-C ([place])  Any Other Special Instructions Will Be Listed Below (If Applicable). Your physician has requested that you regularly monitor and record your blood pressure readings at home for 2 Weeks . Please use the same machine at the same time of day to check your readings and record them to bring to your follow-up visit.  Thank you for choosing [facility]!

## 2019-04-01 NOTE — Progress Notes (Signed)
Cardiology Office Note    Date:  04/02/2019   ID:  [name], DOB 12/22/1940, MRN [id number]  PCP:  Kari BaarsHawkins, Edward, MD  Cardiologist: Prentice DockerSuresh Koneswaran, MD    Chief Complaint  Patient presents with  . Follow-up    3 month visit    History of Present Illness:    Jake MarvelRonald W Calix is a 78 y.o. male with past medical history of CAD (s/p CABG in 2000 with low-risk NST in 01/2017), persistent atrial fibrillation (diagnosed in 11/2018), chronic diastolic CHF, HTN, and HLD who presents to the office today for 383-month follow-up.  He was last examined by Dr. Purvis SheffieldKoneswaran in 11/2018 and reported elevated blood pressure readings and had been started on Amlodipine 5 mg daily via phone visit. An EKG was performed he was found to be in new onset atrial fibrillation. He was overall asymptomatic and ASA was discontinued and he was started on Eliquis 5 mg twice daily. A follow-up echocardiogram was recommended to assess for any structural abnormalities. This showed a preserved EF of 55 to 60% with mild LVH and no significant valve abnormalities.  In talking with the patient today, he reports overall doing well from a cardiac perspective since his last office visit. He denies any recent chest pain, orthopnea, PND, lower extremity edema, or palpitations. Has dyspnea on exertion at times which is unchanged but is able to ride an exercise bike for 30-40 minutes daily without symptoms. Uses a rolling walker due to chronic back pain and the need for a leg brace. Has overall been unaware of his arrhythmias. Reports good compliance with anticoagulation but does have occasional hemorrhoids. Due for repeat labs with his PCP next week.   Past Medical History:  Diagnosis Date  . Acute renal failure (HCC) 04/17/2013  . Anemia, iron deficiency 09/09/2013  . Anxiety   . Arthritis   . Atrial fibrillation (HCC)   . 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 Coronary artery disease    a. s/p CABG  in 2000 with low-risk NST in 01/2017  . Disorder of blood    BEEN TREATED BY DERMATOLOGIST X 4 YRS..."BLOOD BLISTERS"  . Gout   . HTN (hypertension)    se[place] Dr. Juanetta GoslingHawkins in [place]  . Hx MRSA infection    rt shoulder  . Hyperlipidemia   . Pneumonia    "I've had it 3-4 times"  . Small bowel obstruction (HCC)   . Small bowel problem    HAD ALOT OF SCAR TISSUE FROM PREVIOUS SURGERIES..NG WAS INSERTED ...Marland Kitchen.Marland Kitchen.NO SURGERY NEEDED...Marland Kitchen.Marland Kitchen.IN FOR 8 DAYS  . Ventral hernia     Past Surgical History:  Procedure Laterality Date  . ANTERIOR CERVICAL CORPECTOMY  12/17/11  . ANTERIOR CERVICAL CORPECTOMY  12/17/2011   Procedure: ANTERIOR CERVICAL CORPECTOMY;  Surgeon: Karn CassisErnesto M Botero, MD;  Location: [facility];  Service: Neurosurgery;  Laterality: N/A;  Anterior Cervical Decompression Fusion Five to Thoracic Two with plating  . BACK SURGERY     lumbar  . CATARACT EXTRACTION W/ INTRAOCULAR LENS  IMPLANT, BILATERAL  ? 2011  . CHOLECYSTECTOMY  2006 "or after"  . CORONARY ANGIOPLASTY WITH STENT PLACEMENT  2012  . CORONARY ARTERY BYPASS GRAFT  2000   CABG X5  . EYE SURGERY     bilateral cataract  . INCISION AND DRAINAGE OF WOUND  ~ 20ll; 12/03/11   "had infection in my right"  . LUMBAR LAMINECTOMY/DECOMPRESSION MICRODISCECTOMY  06/13/2011   Procedure: LUMBAR LAMINECTOMY/DECOMPRESSION MICRODISCECTOMY;  Surgeon: Karn CassisErnesto M Botero;  Location: [facility];  Service: Neurosurgery;  Laterality: N/A;  Lumbar three, lumbar four-five Laminectomy  . LUMBAR LAMINECTOMY/DECOMPRESSION MICRODISCECTOMY Right 06/17/2013   Procedure: LUMBAR LAMINECTOMY/DECOMPRESSION MICRODISCECTOMY 1 LEVEL;  Surgeon: Karn CassisErnesto M Botero, MD;  Location: [facility];  Service: Neurosurgery;  Laterality: Right;  Right L3-4 Microdiskectomy  . LUMBAR WOUND DEBRIDEMENT N/A 08/02/2013   Procedure: INCISION AND DRAINAGE OF LUMBAR WOUND DEBRIDEMENT;  Surgeon: Karn CassisErnesto M Botero, MD;  Location: [facility];  Service: Neurosurgery;  Laterality: N/A;  .  PERIPHERALLY INSERTED CENTRAL CATHETER INSERTION  2011 & 11/2011  . SHOULDER ARTHROSCOPY Left 09/13/2013   Procedure: ARTHROSCOPIC IRRIGATION AND DEBRIDEMENT, SYNOVECTOMY ,  LEFT SHOULDER ;  Surgeon: Thera FlakeW D Caffrey Jr., MD;  Location: [facility];  Service: Orthopedics;  Laterality: Left;  . SHOULDER OPEN ROTATOR CUFF REPAIR  ~ 2011   right  . STERNAL SURG  2000   HAS HAD 5-6 ON HIS STERNUM; "caught MRSA in it"  . TONSILLECTOMY  1949    Current Medications: Outpatient Medications Prior to Visit  Medication Sig Dispense Refill  . acetaminophen (TYLENOL) 325 MG tablet Take 1-2 tablets (325-650 mg total) by mouth every 4 (four) hours as needed for mild pain.    Marland Kitchen. amoxicillin-clavulanate (AUGMENTIN) 500-125 MG tablet Take by mouth.    Marland Kitchen. atorvastatin (LIPITOR) 40 MG tablet TAKE 1 TABLET BY MOUTH EVERY DAY 30 tablet 6  . carvedilol (COREG) 25 MG tablet Take 25 mg by mouth 2 (two) times daily with a meal.    . docusate sodium (COLACE) 100 MG capsule Take 100 mg by mouth daily.    . finasteride (PROSCAR) 5 MG tablet Take 5 mg by mouth daily.    Marland Kitchen. HYDROcodone-acetaminophen (NORCO) 10-325 MG tablet TK 1 T PO QID  0  . Multiple Vitamin (MULTIVITAMIN) tablet Take 1 tablet by mouth daily.    . nitroGLYCERIN (NITROSTAT) 0.4 MG SL tablet Place 0.4 mg under the tongue every 5 (five) minutes as needed for chest pain. May take up to 3 doses per episode. Contact MD or 911 if unresolved chest pain continues.    . predniSONE (DELTASONE) 10 MG tablet Take 5-10 mg by mouth as directed.  1  . rivaroxaban (XARELTO) 20 MG TABS tablet Take 20 mg by mouth daily with supper.    . tamsulosin (FLOMAX) 0.4 MG CAPS capsule Take 0.4 mg by mouth.    Marland Kitchen. amLODipine (NORVASC) 5 MG tablet Take 1 tablet (5 mg total) by mouth daily. (Patient not taking: Reported on 04/01/2019) 90 tablet 3  . doxazosin (CARDURA) 8 MG tablet Take 8 mg by mouth daily.    Marland Kitchen. apixaban (ELIQUIS) 5 MG TABS tablet Take 1 tablet (5 mg total) by mouth 2 (two) times  daily. 60 tablet 11  . carvedilol (COREG) 12.5 MG tablet Take 1 tablet (12.5 mg total) by mouth 2 (two) times daily. 180 tablet 3   No facility-administered medications prior to visit.      Allergies:   Morphine, Metoprolol, and Rifampin   Social History   Socioeconomic History  . Marital status: Married    Spouse name: Not on file  . Number of children: Not on file  . Years of education: Not on file  . Highest education level: Not on file  Occupational History  . Not on file  Social Needs  . Financial resource strain: Not on file  . Food insecurity    Worry: Not on file  Inability: Not on file  . Transportation needs    Medical: Not on file    Non-medical: Not on file  Tobacco Use  . Smoking status: Former Smoker    Packs/day: 1.00    Years: 1.00    Pack years: 1.00    Types: Cigarettes    Start date: 12/29/1959    Quit date: 12/27/1998    Years since quitting: 20.2  . Smokeless tobacco: Never Used  Substance and Sexual Activity  . Alcohol use: No    Alcohol/week: 0.0 standard drinks  . Drug use: No  . Sexual activity: Not Currently  Lifestyle  . Physical activity    Days per week: Not on file    Minutes per session: Not on file  . Stress: Not on file  Relationships  . Social Musician on phone: Not on file    Gets together: Not on file    Attends religious service: Not on file    Active member of club or organization: Not on file    Attends meetings of clubs or organizations: Not on file    Relationship status: Not on file  Other Topics Concern  . Not on file  Social History Narrative  . Not on file     Family History:  The patient's family history includes Heart disease in an other family member; Hypertension in his maternal aunt, maternal uncle, and mother.   Review of Systems:   Please see the history of present illness.     General:  No chills, fever, night sweats or weight changes.  Cardiovascular:  No chest pain, edema, orthopnea,  palpitations, paroxysmal nocturnal dyspnea. Positive for dyspnea (stable).  Dermatological: No rash, lesions/masses Respiratory: No cough, dyspnea Urologic: No hematuria, dysuria Abdominal:   No nausea, vomiting, diarrhea, bright red blood per rectum, melena, or hematemesis Neurologic:  No visual changes, wkns, changes in mental status. All other systems reviewed and are otherwise negative except as noted above.   Physical Exam:    VS:  BP (!) 142/82   Pulse 93   Temp 98 F (36.7 C) (Temporal)   Ht 6' (1.829 m)   Wt 157 lb (71.2 kg)   SpO2 95%   BMI 21.29 kg/m    General: Well developed, well nourished,male appearing in no acute distress. Head: Normocephalic, atraumatic, sclera non-icteric, no xanthomas, nares are without discharge.  Neck: No carotid bruits. JVD not elevated.  Lungs: Respirations regular and unlabored, without wheezes or rales.  Heart: Irregularly lrregular. No S3 or S4.  No murmur, no rubs, or gallops appreciated. Abdomen: Soft, non-tender, non-distended with normoactive bowel sounds. No hepatomegaly. No rebound/guarding. No obvious abdominal masses. Msk:  Strength and tone appear normal for age. No joint deformities or effusions. Extremities: No clubbing or cyanosis. No lower extremity edema.  Distal pedal pulses are 2+ bilaterally. Brace in place.  Neuro: Alert and oriented X 3. Moves all extremities spontaneously. No focal deficits noted. Psych:  Responds to questions appropriately with a normal affect. Skin: No rashes or lesions noted  Wt Readings from Last 3 Encounters:  04/01/19 157 lb (71.2 kg)  12/23/18 162 lb (73.5 kg)  02/24/18 170 lb 9.6 oz (77.4 kg)     Studies/Labs Reviewed:   EKG:  EKG is not ordered today.   Recent Labs: 12/23/2018: BUN 24; Creatinine, Ser 1.27; Hemoglobin 12.6; Platelets 204; Potassium 4.6; Sodium 138   Lipid Panel    Component Value Date/Time   CHOL 143 02/26/2018 0743  TRIG 152 (H) 02/26/2018 0743   HDL 51  02/26/2018 0743   CHOLHDL 2.8 02/26/2018 0743   VLDL 18 01/20/2017 1630   LDLCALC 69 02/26/2018 0743    Additional studies/ records that were reviewed today include:   Echocardiogram: 12/27/2018 IMPRESSIONS   1. The left ventricle has normal systolic function, with an ejection fraction of 55-60%. The cavity size was normal. There is mild concentric left ventricular hypertrophy. Left ventricular diastolic function could not be evaluated secondary to atrial  fibrillation.  2. The right ventricle has mildly reduced systolic function. The cavity was mildly enlarged. There is no increase in right ventricular wall thickness.  3. Left atrial size was severely dilated.  4. The mitral valve is grossly normal. Mild thickening of the mitral valve leaflet. There is mild mitral annular calcification present.  5. The tricuspid valve is grossly normal.  6. The aortic valve is tricuspid. Mild thickening of the aortic valve.  7. Moderate aortic annular calcification.  8. The aortic root is normal in size and structure.  9. The inferior vena cava was dilated in size with >50% respiratory variability. 10. The interatrial septum was not well visualized.  NST: 01/2017  Clinically negative for ischemia Normal perfusion  The study is normal.  This is a low risk study.  Nuclear stress EF: 61%.  Assessment:    1. Coronary artery disease of bypass graft of native heart with stable angina pectoris (HCC)   2. Persistent atrial fibrillation   3. Essential hypertension   4. Hyperlipidemia LDL goal <70      Plan:   In order of problems listed above:  1. CAD - s/p CABG in 2000 with low-risk NST in 01/2017. Recent echo showed a preserved EF with no regional WMA. He has stable dyspnea but denies any acute changes in this. No recent chest pain. Able to ride an exercise bike for 30-40 minutes daily without anginal symptoms.  - continue current medication regimen with statin and BB therapy. No longer on  ASA given the need for anticoagulation.   2. Persistent Atrial Fibrillation - diagnosed in 11/2018 and in atrial fibrillation by examination today. Reviewed options in regards to rate versus rhythm control and given his asymptomatic state, he does not wish to undergo a DCCV at this time. Continue Coreg 25mg  BID for rate-control. - he remains on Xarelto for anticoagulation. Does report hemorrhoids and I recommended he have a repeat CBC with his upcoming labs.   3. HTN - BP initially elevated at 154/67, at 142/82 on recheck. Says SBP has been variable from the 110's to 150's at home. I encouraged him to keep a BP log for 2-3 weeks and report back on readings. If remaining above goal, would titrate Amlodipine from 5mg  daily to 10mg  daily. Continue Coreg 25mg  BID and Cardura 8mg  daily.   4. HLD - followed by PCP. FLP in 2019 showed total cholesterol of 143, HDL 51, triglycerides 152, and LDL 69. Remains on Atorvastatin 40mg  daily.   Medication Adjustments/Labs and Tests Ordered: Current medicines are reviewed at length with the patient today.  Concerns regarding medicines are outlined above.  Medication changes, Labs and Tests ordered today are listed in the Patient Instructions below. Patient Instructions  Medication Instructions:  Your physician recommends that you continue on your current medications as directed. Please refer to the Current Medication list given to you today.  If you need a refill on your cardiac medications before your next appointment, please call your pharmacy.  Lab work: NONE   If you have labs (blood work) drawn today and your tests are completely normal, you will receive your results only by: Marland Kitchen MyChart Message (if you have MyChart) OR . A paper copy in the mail If you have any lab test that is abnormal or we need to change your treatment, we will call you to review the results.  Testing/Procedures: NONE   Follow-Up: At [facility], you and your health needs  are our priority.  As part of our continuing mission to provide you with exceptional heart care, we have created designated Provider Care Teams.  These Care Teams include your primary Cardiologist (physician) and Advanced Practice Providers (APPs -  Physician Assistants and Nurse Practitioners) who all work together to provide you with the care you need, when you need it. You will need a follow up appointment in 6 months.  Please call our office 2 months in advance to schedule this appointment.  You may see Kate Sable, MD or one of the following Advanced Practice Providers on your designated Care Team:   [name], PA-C [facility]) . Ermalinda Barrios, PA-C ([place])  Any Other Special Instructions Will Be Listed Below (If Applicable). Your physician has requested that you regularly monitor and record your blood pressure readings at home for 2 Weeks . Please use the same machine at the same time of day to check your readings and record them to bring to your follow-up visit.  Thank you for choosing [facility]!        Signed, Erma Heritage, PA-C  04/02/2019 10:13 AM    [facility] [address] [place], [place] [address] Phone: [phone] Fax: [phone]

## 2019-04-02 ENCOUNTER — Encounter: Payer: Self-pay | Admitting: Student

## 2019-04-25 DIAGNOSIS — R338 Other retention of urine: Secondary | ICD-10-CM | POA: Diagnosis not present

## 2019-05-05 DIAGNOSIS — Z23 Encounter for immunization: Secondary | ICD-10-CM | POA: Diagnosis not present

## 2019-05-12 ENCOUNTER — Other Ambulatory Visit (HOSPITAL_COMMUNITY): Payer: Self-pay | Admitting: Pulmonary Disease

## 2019-05-12 ENCOUNTER — Ambulatory Visit (HOSPITAL_COMMUNITY)
Admission: RE | Admit: 2019-05-12 | Discharge: 2019-05-12 | Disposition: A | Discharge status: Home / Self Care | Payer: Medicare Other | Source: Ambulatory Visit | Attending: Pulmonary Disease | Admitting: Pulmonary Disease

## 2019-05-12 ENCOUNTER — Other Ambulatory Visit: Payer: Self-pay

## 2019-05-12 DIAGNOSIS — R600 Localized edema: Secondary | ICD-10-CM | POA: Diagnosis not present

## 2019-05-12 DIAGNOSIS — J849 Interstitial pulmonary disease, unspecified: Secondary | ICD-10-CM | POA: Diagnosis not present

## 2019-05-12 DIAGNOSIS — M79662 Pain in left lower leg: Secondary | ICD-10-CM | POA: Diagnosis not present

## 2019-05-12 DIAGNOSIS — M7989 Other specified soft tissue disorders: Secondary | ICD-10-CM | POA: Insufficient documentation

## 2019-05-12 DIAGNOSIS — M545 Low back pain: Secondary | ICD-10-CM | POA: Diagnosis not present

## 2019-05-12 DIAGNOSIS — G834 Cauda equina syndrome: Secondary | ICD-10-CM | POA: Diagnosis not present

## 2019-05-17 DIAGNOSIS — R338 Other retention of urine: Secondary | ICD-10-CM | POA: Diagnosis not present

## 2019-05-30 ENCOUNTER — Ambulatory Visit: Payer: Medicare Other | Admitting: Physician Assistant

## 2019-05-30 ENCOUNTER — Encounter: Payer: Self-pay | Admitting: Physician Assistant

## 2019-05-30 ENCOUNTER — Other Ambulatory Visit: Payer: Self-pay

## 2019-05-30 VITALS — BP 129/65 | HR 72 | Temp 97.3°F | Ht 72.0 in | Wt 158.0 lb

## 2019-05-30 DIAGNOSIS — I5032 Chronic diastolic (congestive) heart failure: Secondary | ICD-10-CM

## 2019-05-30 DIAGNOSIS — I4819 Other persistent atrial fibrillation: Secondary | ICD-10-CM | POA: Insufficient documentation

## 2019-05-30 DIAGNOSIS — I2510 Atherosclerotic heart disease of native coronary artery without angina pectoris: Secondary | ICD-10-CM | POA: Diagnosis not present

## 2019-05-30 DIAGNOSIS — R600 Localized edema: Secondary | ICD-10-CM

## 2019-05-30 DIAGNOSIS — I10 Essential (primary) hypertension: Secondary | ICD-10-CM | POA: Diagnosis not present

## 2019-05-30 DIAGNOSIS — E782 Mixed hyperlipidemia: Secondary | ICD-10-CM

## 2019-05-30 NOTE — Progress Notes (Signed)
Cardiology Office Note    Date:  05/30/2019   ID:  [name], DOB 11-18-1940, MRN [id number]  PCP:  Sinda Du, MD  Cardiologist: Kate Sable, MD EPS: None  Chief Complaint  Patient presents with  . Leg Swelling    History of Present Illness:  [name] is a 78 y.o. male with historyof CAD s/p CABG in 2000 with low-risk NST in 01/2017, persistent atrial fibrillation diagnosed in 11/2018 asymptomatic and started on Xarelto, chronic diastolic CHF, HTN, and HLD .  Echo 11/2018 LVEF 55 to 60% with mild LVH.    Last office visit with [name], PA-C 04/01/2019.  Patient was added onto my schedule today because of leg swelling.  Complained of left leg swelling 2 weeks ago and Dr. [name] ordered dopplers that were negative.  He felt the patient might have edema from his left leg brace not fitting properly.  Patient disagrees with this.  Now some  right leg swelling. Eating out more-fast food-[employer], [employer], [employer]. Wife has dementia and can't cook anymore. Breathing is "ok" chronic dyspnea on exertion. Not working out like he used to. On chronic prednisone for left foot pain-no change in dose.      Past Medical History:  Diagnosis Date  . Acute renal failure ([place]) 04/17/2013  . Anemia, iron deficiency 09/09/2013  . Anxiety   . Arthritis   . Atrial fibrillation ([name])   . 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 Coronary artery disease    a. s/p CABG in 2000 with low-risk NST in 01/2017  . Disorder of blood    BEEN TREATED BY DERMATOLOGIST X 4 YRS..."BLOOD BLISTERS"  . Gout   . HTN (hypertension)    sees Dr. [name] in [place]  . Hx MRSA infection    rt shoulder  . Hyperlipidemia   . Pneumonia    "I've had it 3-4 times"  . Small bowel obstruction ([place])   . Small bowel problem    HAD ALOT OF SCAR TISSUE FROM PREVIOUS SURGERIES..NG WAS INSERTED ...Marland KitchenMarland KitchenNO SURGERY NEEDED...Marland KitchenMarland KitchenIN FOR 8 DAYS  . Ventral hernia     Past  Surgical History:  Procedure Laterality Date  . ANTERIOR CERVICAL CORPECTOMY  12/17/11  . ANTERIOR CERVICAL CORPECTOMY  12/17/2011   Procedure: ANTERIOR CERVICAL CORPECTOMY;  Surgeon: Floyce Stakes, MD;  Location: [facility];  Service: Neurosurgery;  Laterality: N/A;  Anterior Cervical Decompression Fusion Five to Thoracic Two with plating  . BACK SURGERY     lumbar  . CATARACT EXTRACTION W/ INTRAOCULAR LENS  IMPLANT, BILATERAL  ? 2011  . CHOLECYSTECTOMY  2006 "or after"  . CORONARY ANGIOPLASTY WITH STENT PLACEMENT  2012  . CORONARY ARTERY BYPASS GRAFT  2000   CABG X5  . EYE SURGERY     bilateral cataract  . INCISION AND DRAINAGE OF WOUND  ~ 20ll; 12/03/11   "had infection in my right"  . LUMBAR LAMINECTOMY/DECOMPRESSION MICRODISCECTOMY  06/13/2011   Procedure: LUMBAR LAMINECTOMY/DECOMPRESSION MICRODISCECTOMY;  Surgeon: Floyce Stakes;  Location: [facility];  Service: Neurosurgery;  Laterality: N/A;  Lumbar three, lumbar four-five Laminectomy  . LUMBAR LAMINECTOMY/DECOMPRESSION MICRODISCECTOMY Right 06/17/2013   Procedure: LUMBAR LAMINECTOMY/DECOMPRESSION MICRODISCECTOMY 1 LEVEL;  Surgeon: Floyce Stakes, MD;  Location: [facility];  Service: Neurosurgery;  Laterality: Right;  Right L3-4 Microdiskectomy  . LUMBAR WOUND DEBRIDEMENT N/A 08/02/2013   Procedure: INCISION AND DRAINAGE OF LUMBAR WOUND DEBRIDEMENT;  Surgeon: Floyce Stakes, MD;  Location: [facility];  Service: Neurosurgery;  Laterality: N/A;  . [place] CATHETER INSERTION  2011 & 11/2011  . SHOULDER ARTHROSCOPY Left 09/13/2013   Procedure: ARTHROSCOPIC IRRIGATION AND DEBRIDEMENT, SYNOVECTOMY ,  LEFT SHOULDER ;  Surgeon: Yvette Rack., MD;  Location: [facility];  Service: Orthopedics;  Laterality: Left;  . SHOULDER OPEN ROTATOR CUFF REPAIR  ~ 2011   right  . STERNAL SURG  2000   HAS HAD 5-6 ON HIS STERNUM; "caught MRSA in it"  . TONSILLECTOMY  1949    Current Medications: Current Meds  Medication  Sig  . acetaminophen (TYLENOL) 325 MG tablet Take 1-2 tablets (325-650 mg total) by mouth every 4 (four) hours as needed for mild pain.  Marland Kitchen atorvastatin (LIPITOR) 40 MG tablet TAKE 1 TABLET BY MOUTH EVERY DAY  . carvedilol (COREG) 25 MG tablet Take 25 mg by mouth 2 (two) times daily with a meal.  . docusate sodium (COLACE) 100 MG capsule Take 100 mg by mouth daily.  Marland Kitchen doxazosin (CARDURA) 8 MG tablet Take 8 mg by mouth daily.  . finasteride (PROSCAR) 5 MG tablet Take 5 mg by mouth daily.  Marland Kitchen HYDROcodone-acetaminophen (NORCO) 10-325 MG tablet TK 1 T PO QID  . Multiple Vitamin (MULTIVITAMIN) tablet Take 1 tablet by mouth daily.  . nitroGLYCERIN (NITROSTAT) 0.4 MG SL tablet Place 0.4 mg under the tongue every 5 (five) minutes as needed for chest pain. May take up to 3 doses per episode. Contact MD or 911 if unresolved chest pain continues.  . predniSONE (DELTASONE) 10 MG tablet Take 5-10 mg by mouth as directed.  . rivaroxaban (XARELTO) 20 MG TABS tablet Take 20 mg by mouth daily with supper.  . tamsulosin (FLOMAX) 0.4 MG CAPS capsule Take 0.4 mg by mouth.     Allergies:   Morphine, Metoprolol, and Rifampin   Social History   Socioeconomic History  . Marital status: Married    Spouse name: Not on file  . Number of children: Not on file  . Years of education: Not on file  . Highest education level: Not on file  Occupational History  . Not on file  Social Needs  . Financial resource strain: Not on file  . Food insecurity    Worry: Not on file    Inability: Not on file  . Transportation needs    Medical: Not on file    Non-medical: Not on file  Tobacco Use  . Smoking status: Former Smoker    Packs/day: 1.00    Years: 1.00    Pack years: 1.00    Types: Cigarettes    Start date: 12/29/1959    Quit date: 12/27/1998    Years since quitting: 20.4  . Smokeless tobacco: Never Used  Substance and Sexual Activity  . Alcohol use: No    Alcohol/week: 0.0 standard drinks  . Drug use: No  .  Sexual activity: Not Currently  Lifestyle  . Physical activity    Days per week: Not on file    Minutes per session: Not on file  . Stress: Not on file  Relationships  . Social Herbalist on phone: Not on file    Gets together: Not on file    Attends religious service: Not on file    Active member of club or organization: Not on file    Attends meetings of clubs or organizations: Not on file    Relationship status: Not on file  Other Topics  Concern  . Not on file  Social History Narrative  . Not on file     Family History:  The patient's   family history includes Heart disease in an other family member; Hypertension in his maternal aunt, maternal uncle, and mother.   ROS:   Please see the history of present illness.    ROS All other systems reviewed and are negative.   PHYSICAL EXAM:   VS:  BP 129/65   Pulse 72   Temp (!) 97.3 F (36.3 C) (Temporal)   Ht 6' (1.829 m)   Wt 158 lb (71.7 kg) Comment: WITH BRACE ON LEG  SpO2 93%   BMI 21.43 kg/m   Physical Exam  GEN: Thin, appears dehydrated, in no acute distress  Neck: no JVD, carotid bruits, or masses Cardiac: Irregular irregular; no murmurs, rubs, or gallops  Respiratory:  clear to auscultation bilaterally, normal work of breathing GI: soft, nontender, nondistended, + BS Ext: Left leg in brace with 2+ edema right leg trace of edema MS: no deformity or atrophy  Skin: warm and dry, no rash Neuro:  Alert and Oriented x 3 Psych: euthymic mood, full affect  Wt Readings from Last 3 Encounters:  05/30/19 158 lb (71.7 kg)  04/01/19 157 lb (71.2 kg)  12/23/18 162 lb (73.5 kg)      Studies/Labs Reviewed:   EKG:  EKG is not ordered today.   Recent Labs: 12/23/2018: BUN 24; Creatinine, Ser 1.27; Hemoglobin 12.6; Platelets 204; Potassium 4.6; Sodium 138   Lipid Panel    Component Value Date/Time   CHOL 143 02/26/2018 0743   TRIG 152 (H) 02/26/2018 0743   HDL 51 02/26/2018 0743   CHOLHDL 2.8 02/26/2018  0743   VLDL 18 01/20/2017 1630   LDLCALC 69 02/26/2018 0743    Additional studies/ records that were reviewed today include:  Echocardiogram: 12/27/2018 IMPRESSIONS    1. The left ventricle has normal systolic function, with an ejection fraction of 55-60%. The cavity size was normal. There is mild concentric left ventricular hypertrophy. Left ventricular diastolic function could not be evaluated secondary to atrial  fibrillation.  2. The right ventricle has mildly reduced systolic function. The cavity was mildly enlarged. There is no increase in right ventricular wall thickness.  3. Left atrial size was severely dilated.  4. The mitral valve is grossly normal. Mild thickening of the mitral valve leaflet. There is mild mitral annular calcification present.  5. The tricuspid valve is grossly normal.  6. The aortic valve is tricuspid. Mild thickening of the aortic valve.  7. Moderate aortic annular calcification.  8. The aortic root is normal in size and structure.  9. The inferior vena cava was dilated in size with >50% respiratory variability. 10. The interatrial septum was not well visualized.   NST: 01/2017  Clinically negative for ischemia Normal perfusion  The study is normal.  This is a low risk study.  Nuclear stress EF: 61%.     ASSESSMENT:    1. Leg edema   2. Chronic diastolic congestive heart failure ([place])   3. CAD in native artery   4. Persistent atrial fibrillation ([place])   5. Essential hypertension   6. Mixed hyperlipidemia      PLAN:  In order of problems listed above:  Leg edema left greater than right suspect secondary to excessive sodium intake because of eating out and decreasing exercise.  Recommend 2 g sodium diet keeping legs elevated when resting and increase exercise.  He should  also have his brace checked as recommended by Dr. [name] as a cause of the edema.  He appears a little dehydrated.  He does self catheterizations and has not had anything  to drink today because he was coming here.  I am reluctant to start any diuretic.  Will check be met and BNP.  Follow-up in 2 weeks.  chronic diastolic CHF mild edema suspect secondary to causes above  CAD test post CABG in 2000 and low risk NST 01/2017 no angina  Persistent atrial fibrillation diagnosed 11/2018 on Xarelto and Coreg, asymptomatic  Essential hypertension blood pressure stable  Hyperlipidemia followed by PCP LDL 69 in 2019 on atorvastatin  Medication Adjustments/Labs and Tests Ordered: Current medicines are reviewed at length with the patient today.  Concerns regarding medicines are outlined above.  Medication changes, Labs and Tests ordered today are listed in the Patient Instructions below. Patient Instructions  Medication Instructions:  Your physician recommends that you continue on your current medications as directed. Please refer to the Current Medication list given to you today.  *If you need a refill on your cardiac medications before your next appointment, please call your pharmacy*  Lab Work: Your physician recommends that you return for lab work in: Today   If you have labs (blood work) drawn today and your tests are completely normal, you will receive your results only by: Marland Kitchen MyChart Message (if you have MyChart) OR . A paper copy in the mail If you have any lab test that is abnormal or we need to change your treatment, we will call you to review the results.  Testing/Procedures: NONE   Follow-Up: At [facility], you and your health needs are our priority.  As part of our continuing mission to provide you with exceptional heart care, we have created designated Provider Care Teams.  These Care Teams include your primary Cardiologist (physician) and Advanced Practice Providers (APPs -  Physician Assistants and Nurse Practitioners) who all work together to provide you with the care you need, when you need it.  Your next appointment:   2 Weeks   The format  for your next appointment:   In Person  Provider:   Ermalinda Barrios, PA-C  Other Instructions Thank you for choosing [facility]!   Low-Sodium Eating Plan Sodium, which is an element that makes up salt, helps you maintain a healthy balance of fluids in your body. Too much sodium can increase your blood pressure and cause fluid and waste to be held in your body. Your health care provider or dietitian may recommend following this plan if you have high blood pressure (hypertension), kidney disease, liver disease, or heart failure. Eating less sodium can help lower your blood pressure, reduce swelling, and protect your heart, liver, and kidneys. What are tips for following this plan? General guidelines  Most people on this plan should limit their sodium intake to 1,500-2,000 mg (milligrams) of sodium each day. Reading food labels   The Nutrition Facts label lists the amount of sodium in one serving of the food. If you eat more than one serving, you must multiply the listed amount of sodium by the number of servings.  Choose foods with less than 140 mg of sodium per serving.  Avoid foods with 300 mg of sodium or more per serving. Shopping  Look for lower-sodium products, often labeled as "low-sodium" or "no salt added."  Always check the sodium content even if foods are labeled as "unsalted" or "no salt added".  Buy  fresh foods. ? Avoid canned foods and premade or frozen meals. ? Avoid canned, cured, or processed meats  Buy breads that have less than 80 mg of sodium per slice. Cooking  Eat more home-cooked food and less restaurant, buffet, and fast food.  Avoid adding salt when cooking. Use salt-free seasonings or herbs instead of table salt or sea salt. Check with your health care provider or pharmacist before using salt substitutes.  Cook with plant-based oils, such as canola, sunflower, or olive oil. Meal planning  When eating at a restaurant, ask that your food be  prepared with less salt or no salt, if possible.  Avoid foods that contain MSG (monosodium glutamate). MSG is sometimes added to [place] food, bouillon, and some canned foods. What foods are recommended? The items listed may not be a complete list. Talk with your dietitian about what dietary choices are best for you. Grains Low-sodium cereals, including oats, puffed wheat and rice, and shredded wheat. Low-sodium crackers. Unsalted rice. Unsalted pasta. Low-sodium bread. Whole-grain breads and whole-grain pasta. Vegetables Fresh or frozen vegetables. "No salt added" canned vegetables. "No salt added" tomato sauce and paste. Low-sodium or reduced-sodium tomato and vegetable juice. Fruits Fresh, frozen, or canned fruit. Fruit juice. Meats and other protein foods Fresh or frozen (no salt added) meat, poultry, seafood, and fish. Low-sodium canned tuna and salmon. Unsalted nuts. Dried peas, beans, and lentils without added salt. Unsalted canned beans. Eggs. Unsalted nut butters. Dairy Milk. Soy milk. Cheese that is naturally low in sodium, such as ricotta cheese, fresh mozzarella, or Swiss cheese Low-sodium or reduced-sodium cheese. Cream cheese. Yogurt. Fats and oils Unsalted butter. Unsalted margarine with no trans fat. Vegetable oils such as canola or olive oils. Seasonings and other foods Fresh and dried herbs and spices. Salt-free seasonings. Low-sodium mustard and ketchup. Sodium-free salad dressing. Sodium-free light mayonnaise. Fresh or refrigerated horseradish. Lemon juice. Vinegar. Homemade, reduced-sodium, or low-sodium soups. Unsalted popcorn and pretzels. Low-salt or salt-free chips. What foods are not recommended? The items listed may not be a complete list. Talk with your dietitian about what dietary choices are best for you. Grains Instant hot cereals. Bread stuffing, pancake, and biscuit mixes. Croutons. Seasoned rice or pasta mixes. Noodle soup cups. Boxed or frozen macaroni and  cheese. Regular salted crackers. Self-rising flour. Vegetables Sauerkraut, pickled vegetables, and relishes. Olives. [place] fries. Onion rings. Regular canned vegetables (not low-sodium or reduced-sodium). Regular canned tomato sauce and paste (not low-sodium or reduced-sodium). Regular tomato and vegetable juice (not low-sodium or reduced-sodium). Frozen vegetables in sauces. Meats and other protein foods Meat or fish that is salted, canned, smoked, spiced, or pickled. Bacon, ham, sausage, hotdogs, corned beef, chipped beef, packaged lunch meats, salt pork, jerky, pickled herring, anchovies, regular canned tuna, sardines, salted nuts. Dairy Processed cheese and cheese spreads. Cheese curds. Blue cheese. Feta cheese. String cheese. Regular cottage cheese. Buttermilk. Canned milk. Fats and oils Salted butter. Regular margarine. Ghee. Bacon fat. Seasonings and other foods Onion salt, garlic salt, seasoned salt, table salt, and sea salt. Canned and packaged gravies. Worcestershire sauce. Tartar sauce. Barbecue sauce. Teriyaki sauce. Soy sauce, including reduced-sodium. Steak sauce. Fish sauce. Oyster sauce. Cocktail sauce. Horseradish that you find on the shelf. Regular ketchup and mustard. Meat flavorings and tenderizers. Bouillon cubes. Hot sauce and Tabasco sauce. Premade or packaged marinades. Premade or packaged taco seasonings. Relishes. Regular salad dressings. Salsa. Potato and tortilla chips. Corn chips and puffs. Salted popcorn and pretzels. Canned or dried soups. Pizza. Frozen entrees and pot  pies. Summary  Eating less sodium can help lower your blood pressure, reduce swelling, and protect your heart, liver, and kidneys.  Most people on this plan should limit their sodium intake to 1,500-2,000 mg (milligrams) of sodium each day.  Canned, boxed, and frozen foods are high in sodium. Restaurant foods, fast foods, and pizza are also very high in sodium. You also get sodium by adding salt to  food.  Try to cook at home, eat more fresh fruits and vegetables, and eat less fast food, canned, processed, or prepared foods. This information is not intended to replace advice given to you by your health care provider. Make sure you discuss any questions you have with your health care provider. Document Released: 01/03/2002 Document Revised: 06/26/2017 Document Reviewed: 07/07/2016 Elsevier Patient Education  2020 [place], Ermalinda Barrios, [place]  05/30/2019 2:46 PM    [facility] [place], [place], [place]  [address] Phone: [phone]; Fax: [phone]

## 2019-05-30 NOTE — Patient Instructions (Signed)
Medication Instructions:  Your physician recommends that you continue on your current medications as directed. Please refer to the Current Medication list given to you today.  *If you need a refill on your cardiac medications before your next appointment, please call your pharmacy*  Lab Work: Your physician recommends that you return for lab work in: Today   If you have labs (blood work) drawn today and your tests are completely normal, you will receive your results only by: Marland Kitchen MyChart Message (if you have MyChart) OR . A paper copy in the mail If you have any lab test that is abnormal or we need to change your treatment, we will call you to review the results.  Testing/Procedures: NONE   Follow-Up: At [facility], you and your health needs are our priority.  As part of our continuing mission to provide you with exceptional heart care, we have created designated Provider Care Teams.  These Care Teams include your primary Cardiologist (physician) and Advanced Practice Providers (APPs -  Physician Assistants and Nurse Practitioners) who all work together to provide you with the care you need, when you need it.  Your next appointment:   2 Weeks   The format for your next appointment:   In Person  Provider:   Ermalinda Barrios, PA-C  Other Instructions Thank you for choosing [facility]!   Low-Sodium Eating Plan Sodium, which is an element that makes up salt, helps you maintain a healthy balance of fluids in your body. Too much sodium can increase your blood pressure and cause fluid and waste to be held in your body. Your health care provider or dietitian may recommend following this plan if you have high blood pressure (hypertension), kidney disease, liver disease, or heart failure. Eating less sodium can help lower your blood pressure, reduce swelling, and protect your heart, liver, and kidneys. What are tips for following this plan? General guidelines  Most people on this  plan should limit their sodium intake to 1,500-2,000 mg (milligrams) of sodium each day. Reading food labels   The Nutrition Facts label lists the amount of sodium in one serving of the food. If you eat more than one serving, you must multiply the listed amount of sodium by the number of servings.  Choose foods with less than 140 mg of sodium per serving.  Avoid foods with 300 mg of sodium or more per serving. Shopping  Look for lower-sodium products, often labeled as "low-sodium" or "no salt added."  Always check the sodium content even if foods are labeled as "unsalted" or "no salt added".  Buy fresh foods. ? Avoid canned foods and premade or frozen meals. ? Avoid canned, cured, or processed meats  Buy breads that have less than 80 mg of sodium per slice. Cooking  Eat more home-cooked food and less restaurant, buffet, and fast food.  Avoid adding salt when cooking. Use salt-free seasonings or herbs instead of table salt or sea salt. Check with your health care provider or pharmacist before using salt substitutes.  Cook with plant-based oils, such as canola, sunflower, or olive oil. Meal planning  When eating at a restaurant, ask that your food be prepared with less salt or no salt, if possible.  Avoid foods that contain MSG (monosodium glutamate). MSG is sometimes added to [place] food, bouillon, and some canned foods. What foods are recommended? The items listed may not be a complete list. Talk with your dietitian about what dietary choices are best for you. Grains Low-sodium  cereals, including oats, puffed wheat and rice, and shredded wheat. Low-sodium crackers. Unsalted rice. Unsalted pasta. Low-sodium bread. Whole-grain breads and whole-grain pasta. Vegetables Fresh or frozen vegetables. "No salt added" canned vegetables. "No salt added" tomato sauce and paste. Low-sodium or reduced-sodium tomato and vegetable juice. Fruits Fresh, frozen, or canned fruit. Fruit juice.  Meats and other protein foods Fresh or frozen (no salt added) meat, poultry, seafood, and fish. Low-sodium canned tuna and salmon. Unsalted nuts. Dried peas, beans, and lentils without added salt. Unsalted canned beans. Eggs. Unsalted nut butters. Dairy Milk. Soy milk. Cheese that is naturally low in sodium, such as ricotta cheese, fresh mozzarella, or Swiss cheese Low-sodium or reduced-sodium cheese. Cream cheese. Yogurt. Fats and oils Unsalted butter. Unsalted margarine with no trans fat. Vegetable oils such as canola or olive oils. Seasonings and other foods Fresh and dried herbs and spices. Salt-free seasonings. Low-sodium mustard and ketchup. Sodium-free salad dressing. Sodium-free light mayonnaise. Fresh or refrigerated horseradish. Lemon juice. Vinegar. Homemade, reduced-sodium, or low-sodium soups. Unsalted popcorn and pretzels. Low-salt or salt-free chips. What foods are not recommended? The items listed may not be a complete list. Talk with your dietitian about what dietary choices are best for you. Grains Instant hot cereals. Bread stuffing, pancake, and biscuit mixes. Croutons. Seasoned rice or pasta mixes. Noodle soup cups. Boxed or frozen macaroni and cheese. Regular salted crackers. Self-rising flour. Vegetables Sauerkraut, pickled vegetables, and relishes. Olives. [place] fries. Onion rings. Regular canned vegetables (not low-sodium or reduced-sodium). Regular canned tomato sauce and paste (not low-sodium or reduced-sodium). Regular tomato and vegetable juice (not low-sodium or reduced-sodium). Frozen vegetables in sauces. Meats and other protein foods Meat or fish that is salted, canned, smoked, spiced, or pickled. Bacon, ham, sausage, hotdogs, corned beef, chipped beef, packaged lunch meats, salt pork, jerky, pickled herring, anchovies, regular canned tuna, sardines, salted nuts. Dairy Processed cheese and cheese spreads. Cheese curds. Blue cheese. Feta cheese. String cheese.  Regular cottage cheese. Buttermilk. Canned milk. Fats and oils Salted butter. Regular margarine. Ghee. Bacon fat. Seasonings and other foods Onion salt, garlic salt, seasoned salt, table salt, and sea salt. Canned and packaged gravies. Worcestershire sauce. Tartar sauce. Barbecue sauce. Teriyaki sauce. Soy sauce, including reduced-sodium. Steak sauce. Fish sauce. Oyster sauce. Cocktail sauce. Horseradish that you find on the shelf. Regular ketchup and mustard. Meat flavorings and tenderizers. Bouillon cubes. Hot sauce and Tabasco sauce. Premade or packaged marinades. Premade or packaged taco seasonings. Relishes. Regular salad dressings. Salsa. Potato and tortilla chips. Corn chips and puffs. Salted popcorn and pretzels. Canned or dried soups. Pizza. Frozen entrees and pot pies. Summary  Eating less sodium can help lower your blood pressure, reduce swelling, and protect your heart, liver, and kidneys.  Most people on this plan should limit their sodium intake to 1,500-2,000 mg (milligrams) of sodium each day.  Canned, boxed, and frozen foods are high in sodium. Restaurant foods, fast foods, and pizza are also very high in sodium. You also get sodium by adding salt to food.  Try to cook at home, eat more fresh fruits and vegetables, and eat less fast food, canned, processed, or prepared foods. This information is not intended to replace advice given to you by your health care provider. Make sure you discuss any questions you have with your health care provider. Document Released: 01/03/2002 Document Revised: 06/26/2017 Document Reviewed: 07/07/2016 Elsevier Patient Education  2020 [employer].

## 2019-05-31 ENCOUNTER — Other Ambulatory Visit (HOSPITAL_COMMUNITY)
Admission: RE | Admit: 2019-05-31 | Discharge: 2019-05-31 | Disposition: A | Discharge status: Home / Self Care | Payer: Medicare Other | Source: Ambulatory Visit | Attending: Physician Assistant | Admitting: Physician Assistant

## 2019-05-31 ENCOUNTER — Telehealth: Payer: Self-pay | Admitting: *Deleted

## 2019-05-31 DIAGNOSIS — I5031 Acute diastolic (congestive) heart failure: Secondary | ICD-10-CM | POA: Insufficient documentation

## 2019-05-31 DIAGNOSIS — I10 Essential (primary) hypertension: Secondary | ICD-10-CM | POA: Diagnosis not present

## 2019-05-31 LAB — BASIC METABOLIC PANEL
Anion gap: 8 (ref 5–15)
BUN: 26 mg/dL — ABNORMAL HIGH (ref 8–23)
CO2: 26 mmol/L (ref 22–32)
Calcium: 8.8 mg/dL — ABNORMAL LOW (ref 8.9–10.3)
Chloride: 104 mmol/L (ref 98–111)
Creatinine, Ser: 1.32 mg/dL — ABNORMAL HIGH (ref 0.61–1.24)
GFR calc Af Amer: >60 mL/min (ref >60)
GFR calc non Af Amer: 52 mL/min — ABNORMAL LOW (ref >60)
Glucose, Bld: 109 mg/dL — ABNORMAL HIGH (ref 70–99)
Potassium: 4.7 mmol/L (ref 3.5–5.1)
Sodium: 138 mmol/L (ref 135–145)

## 2019-05-31 LAB — BRAIN NATRIURETIC PEPTIDE: B Natriuretic Peptide: 545 pg/mL — ABNORMAL HIGH (ref 0.0–100.0)

## 2019-05-31 MED ORDER — FUROSEMIDE 20 MG PO TABS: 20.0000 mg | ORAL_TABLET | Freq: Every day | ORAL | 3 refills | Status: DC

## 2019-05-31 NOTE — Telephone Encounter (Signed)
-----   Message from Imogene Burn, PA-C sent at 05/31/2019 10:16 AM EST ----- Heart failure marker up a little. Give Lasix 20 mg for 2 days. Ask him to stay hydrated. Keep f/u and will recheck labs then.

## 2019-06-01 ENCOUNTER — Telehealth: Payer: Self-pay

## 2019-06-01 NOTE — Telephone Encounter (Signed)
-----   Message from Michele M Lenze, PA-C sent at 05/31/2019 10:16 AM EST ----- Heart failure marker up a little. Give Lasix 20 mg for 2 days. Ask him to stay hydrated. Keep f/u and will recheck labs then. 

## 2019-06-01 NOTE — Telephone Encounter (Signed)
Patient states hen has already taken the lasix today, will stay hydrated and f/u next week

## 2019-06-02 ENCOUNTER — Telehealth: Payer: Self-pay | Admitting: Licensed Clinical Social Worker

## 2019-06-02 NOTE — Telephone Encounter (Signed)
CSW received referral to assist patient with food insecurity. CSW contacted patient's home and spoke with wife. She stated patient was not home and she would have him return call.Raquel Sarna, [place], [place]

## 2019-06-07 NOTE — Progress Notes (Signed)
Cardiology Office Note    Date:  06/13/2019   ID:  [name], DOB 10-23-1940, MRN [id number]  PCP:  Sinda Du, MD  Cardiologist: Kate Sable, MD EPS: None  No chief complaint on file.   History of Present Illness:  [name] is a 78 y.o. male with historyof CAD s/p CABG in 2000 with low-risk NST in 01/2017, persistent atrial fibrillation diagnosed in 11/2018 asymptomatic and started on Xarelto, chronic diastolic CHF, HTN, and HLD .  Echo 11/2018 LVEF 55 to 60% with mild LVH.    I saw the patient 05/30/2019 with left leg edema.  Dopplers done by Dr. [name] were negative for DVT.  He does have his left leg in a brace and Dr. [name] suggested it was not fitting properly but patient did not agree with this.  He was eating out a lot more fast foods because wife had's dementia and was not able to cook anymore.  Appeared a little dehydrated to me and admitted to not drinking anything because he has to self catheterize himself.  BNP came back elevated at 545 creatinine 1.32.  I instructed him to take Lasix 20 mg daily for 2 days  Patient is here for follow-up. Swelling went down with lasix and he's trying to lower salt. Still having some swelling.  Raquel Sarna trying to get heart healthy meals. Struggling with making dinner. Trying to stay hydrated.        Past Medical History:  Diagnosis Date  . Acute renal failure ([place]) 04/17/2013  . Anemia, iron deficiency 09/09/2013  . Anxiety   . Arthritis   . Atrial fibrillation ([place])   . 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 Coronary artery disease    a. s/p CABG in 2000 with low-risk NST in 01/2017  . Disorder of blood    BEEN TREATED BY DERMATOLOGIST X 4 YRS..."BLOOD BLISTERS"  . Gout   . HTN (hypertension)    sees Dr. [name] in [place]  . Hx MRSA infection    rt shoulder  . Hyperlipidemia   . Pneumonia    "I've had it 3-4 times"  . Small bowel obstruction ([place])   . Small bowel  problem    HAD ALOT OF SCAR TISSUE FROM PREVIOUS SURGERIES..NG WAS INSERTED ...Marland KitchenMarland KitchenNO SURGERY NEEDED...Marland KitchenMarland KitchenIN FOR 8 DAYS  . Ventral hernia     Past Surgical History:  Procedure Laterality Date  . ANTERIOR CERVICAL CORPECTOMY  12/17/11  . ANTERIOR CERVICAL CORPECTOMY  12/17/2011   Procedure: ANTERIOR CERVICAL CORPECTOMY;  Surgeon: Floyce Stakes, MD;  Location: [facility];  Service: Neurosurgery;  Laterality: N/A;  Anterior Cervical Decompression Fusion Five to Thoracic Two with plating  . BACK SURGERY     lumbar  . CATARACT EXTRACTION W/ INTRAOCULAR LENS  IMPLANT, BILATERAL  ? 2011  . CHOLECYSTECTOMY  2006 "or after"  . CORONARY ANGIOPLASTY WITH STENT PLACEMENT  2012  . CORONARY ARTERY BYPASS GRAFT  2000   CABG X5  . EYE SURGERY     bilateral cataract  . INCISION AND DRAINAGE OF WOUND  ~ 20ll; 12/03/11   "had infection in my right"  . LUMBAR LAMINECTOMY/DECOMPRESSION MICRODISCECTOMY  06/13/2011   Procedure: LUMBAR LAMINECTOMY/DECOMPRESSION MICRODISCECTOMY;  Surgeon: Floyce Stakes;  Location: [facility];  Service: Neurosurgery;  Laterality: N/A;  Lumbar three, lumbar four-five Laminectomy  . LUMBAR LAMINECTOMY/DECOMPRESSION MICRODISCECTOMY Right 06/17/2013   Procedure: LUMBAR LAMINECTOMY/DECOMPRESSION MICRODISCECTOMY 1 LEVEL;  Surgeon: Floyce Stakes,  MD;  Location: [facility];  Service: Neurosurgery;  Laterality: Right;  Right L3-4 Microdiskectomy  . LUMBAR WOUND DEBRIDEMENT N/A 08/02/2013   Procedure: INCISION AND DRAINAGE OF LUMBAR WOUND DEBRIDEMENT;  Surgeon: Floyce Stakes, MD;  Location: [facility];  Service: Neurosurgery;  Laterality: N/A;  . [place] CATHETER INSERTION  2011 & 11/2011  . SHOULDER ARTHROSCOPY Left 09/13/2013   Procedure: ARTHROSCOPIC IRRIGATION AND DEBRIDEMENT, SYNOVECTOMY ,  LEFT SHOULDER ;  Surgeon: Yvette Rack., MD;  Location: [facility];  Service: Orthopedics;  Laterality: Left;  . SHOULDER OPEN ROTATOR CUFF REPAIR  ~ 2011   right  .  STERNAL SURG  2000   HAS HAD 5-6 ON HIS STERNUM; "caught MRSA in it"  . TONSILLECTOMY  1949    Current Medications: Current Meds  Medication Sig  . acetaminophen (TYLENOL) 325 MG tablet Take 1-2 tablets (325-650 mg total) by mouth every 4 (four) hours as needed for mild pain.  Marland Kitchen amoxicillin-clavulanate (AUGMENTIN) 500-125 MG tablet Take by mouth.  Marland Kitchen atorvastatin (LIPITOR) 40 MG tablet TAKE 1 TABLET BY MOUTH EVERY DAY  . carvedilol (COREG) 25 MG tablet Take 25 mg by mouth 2 (two) times daily with a meal.  . docusate sodium (COLACE) 100 MG capsule Take 100 mg by mouth daily.  Marland Kitchen doxazosin (CARDURA) 8 MG tablet Take 8 mg by mouth daily.  . finasteride (PROSCAR) 5 MG tablet Take 5 mg by mouth daily.  . furosemide (LASIX) 20 MG tablet Take 1 tablet (20 mg total) by mouth daily.  Marland Kitchen HYDROcodone-acetaminophen (NORCO) 10-325 MG tablet TK 1 T PO QID  . Multiple Vitamin (MULTIVITAMIN) tablet Take 1 tablet by mouth daily.  . nitroGLYCERIN (NITROSTAT) 0.4 MG SL tablet Place 0.4 mg under the tongue every 5 (five) minutes as needed for chest pain. May take up to 3 doses per episode. Contact MD or 911 if unresolved chest pain continues.  . predniSONE (DELTASONE) 10 MG tablet Take 5-10 mg by mouth as directed.  . rivaroxaban (XARELTO) 20 MG TABS tablet Take 20 mg by mouth daily with supper.  . tamsulosin (FLOMAX) 0.4 MG CAPS capsule Take 0.4 mg by mouth.     Allergies:   Morphine, Metoprolol, and Rifampin   Social History   Socioeconomic History  . Marital status: Married    Spouse name: Not on file  . Number of children: Not on file  . Years of education: Not on file  . Highest education level: Not on file  Occupational History  . Not on file  Social Needs  . Financial resource strain: Not on file  . Food insecurity    Worry: Not on file    Inability: Not on file  . Transportation needs    Medical: Not on file    Non-medical: Not on file  Tobacco Use  . Smoking status: Former Smoker     Packs/day: 1.00    Years: 1.00    Pack years: 1.00    Types: Cigarettes    Start date: 12/29/1959    Quit date: 12/27/1998    Years since quitting: 20.4  . Smokeless tobacco: Never Used  Substance and Sexual Activity  . Alcohol use: No    Alcohol/week: 0.0 standard drinks  . Drug use: No  . Sexual activity: Not Currently  Lifestyle  . Physical activity    Days per week: Not on file    Minutes per session: Not on file  . Stress: Not on file  Relationships  . Social Herbalist on phone: Not on file    Gets together: Not on file    Attends religious service: Not on file    Active member of club or organization: Not on file    Attends meetings of clubs or organizations: Not on file    Relationship status: Not on file  Other Topics Concern  . Not on file  Social History Narrative  . Not on file     Family History:  The patient's   family history includes Heart disease in an other family member; Hypertension in his maternal aunt, maternal uncle, and mother.   ROS:   Please see the history of present illness.    ROS All other systems reviewed and are negative.   PHYSICAL EXAM:   VS:  BP 105/66   Pulse 64   Temp 97.7 F (36.5 C)   Ht 6' (1.829 m)   Wt 155 lb (70.3 kg)   SpO2 97%   BMI 21.02 kg/m   Physical Exam  GEN: Thin, in no acute distress  Neck: no JVD, carotid bruits, or masses Cardiac:RRR; no murmurs, rubs, or gallops  Respiratory:  clear to auscultation bilaterally, normal work of breathing GI: soft, nontender, nondistended, + BS Ext: plus 1 LLE edema otherwise without cyanosis, clubbing, Good distal pulses bilaterally Neuro:  Alert and Oriented x 3 Psych: euthymic mood, full affect  Wt Readings from Last 3 Encounters:  06/13/19 155 lb (70.3 kg)  05/30/19 158 lb (71.7 kg)  04/01/19 157 lb (71.2 kg)      Studies/Labs Reviewed:   EKG:  EKG is not ordered today.    Recent Labs: 12/23/2018: Hemoglobin 12.6; Platelets 204 05/31/2019: B Natriuretic  Peptide 545.0; BUN 26; Creatinine, Ser 1.32; Potassium 4.7; Sodium 138   Lipid Panel    Component Value Date/Time   CHOL 143 02/26/2018 0743   TRIG 152 (H) 02/26/2018 0743   HDL 51 02/26/2018 0743   CHOLHDL 2.8 02/26/2018 0743   VLDL 18 01/20/2017 1630   LDLCALC 69 02/26/2018 0743    Additional studies/ records that were reviewed today include:   Echocardiogram: 12/27/2018 IMPRESSIONS    1. The left ventricle has normal systolic function, with an ejection fraction of 55-60%. The cavity size was normal. There is mild concentric left ventricular hypertrophy. Left ventricular diastolic function could not be evaluated secondary to atrial  fibrillation.  2. The right ventricle has mildly reduced systolic function. The cavity was mildly enlarged. There is no increase in right ventricular wall thickness.  3. Left atrial size was severely dilated.  4. The mitral valve is grossly normal. Mild thickening of the mitral valve leaflet. There is mild mitral annular calcification present.  5. The tricuspid valve is grossly normal.  6. The aortic valve is tricuspid. Mild thickening of the aortic valve.  7. Moderate aortic annular calcification.  8. The aortic root is normal in size and structure.  9. The inferior vena cava was dilated in size with >50% respiratory variability. 10. The interatrial septum was not well visualized.   NST: 01/2017  Clinically negative for ischemia Normal perfusion  The study is normal.  This is a low risk study.  Nuclear stress EF: 61%.     ASSESSMENT:    1. Acute diastolic congestive heart failure ([place])   2. CAD in native artery   3. Persistent atrial fibrillation ([place])   4. Essential hypertension   5. Hyperlipidemia, unspecified hyperlipidemia type  PLAN:  In order of problems listed above:  Acute on chronic diastolic CHF/Leg edema left greater than right suspect secondary to excessive sodium intake because of eating out and decreasing  exercise.  BNP was elevated at 545.  Lasix 20 mg given for 2 days and asked him to stay hydrated.  2 g sodium diet was given.  Patient still has edema on the left but weight is down 3 pounds.  Struggling with a low-sodium diet but doing the best he can.  I have asked him to contact Raquel Sarna for food assistance.  His wife with dementia said they did not need it.  We will check be met and BNP today and decide if more Lasix is indicated.  Follow-up with Dr. Bronson Ing in 2 months.    CAD test post CABG in 2000 and low risk NST 01/2017 no angina   Persistent atrial fibrillation diagnosed 11/2018 on Xarelto and Coreg, asymptomatic-needs dental extractions in the near future.   Essential hypertension blood pressure stable   Hyperlipidemia followed by PCP LDL 69 in 2019 on atorvastatin       Medication Adjustments/Labs and Tests Ordered: Current medicines are reviewed at length with the patient today.  Concerns regarding medicines are outlined above.  Medication changes, Labs and Tests ordered today are listed in the Patient Instructions below. There are no Patient Instructions on file for this visit.   Sumner Boast, PA-C  06/13/2019 11:35 AM    [facility] [address], [place], [place]  [address] Phone: [phone]; Fax: [phone]

## 2019-06-13 ENCOUNTER — Other Ambulatory Visit: Payer: Self-pay

## 2019-06-13 ENCOUNTER — Encounter: Payer: Self-pay | Admitting: Physician Assistant

## 2019-06-13 ENCOUNTER — Telehealth: Payer: Self-pay | Admitting: Licensed Clinical Social Worker

## 2019-06-13 ENCOUNTER — Other Ambulatory Visit (HOSPITAL_COMMUNITY)
Admission: RE | Admit: 2019-06-13 | Discharge: 2019-06-13 | Disposition: A | Discharge status: Home / Self Care | Payer: Medicare Other | Source: Ambulatory Visit | Attending: Physician Assistant | Admitting: Physician Assistant

## 2019-06-13 ENCOUNTER — Ambulatory Visit: Payer: Medicare Other | Admitting: Physician Assistant

## 2019-06-13 VITALS — BP 105/66 | HR 64 | Temp 97.7°F | Ht 72.0 in | Wt 155.0 lb

## 2019-06-13 DIAGNOSIS — E785 Hyperlipidemia, unspecified: Secondary | ICD-10-CM

## 2019-06-13 DIAGNOSIS — I2510 Atherosclerotic heart disease of native coronary artery without angina pectoris: Secondary | ICD-10-CM

## 2019-06-13 DIAGNOSIS — I10 Essential (primary) hypertension: Secondary | ICD-10-CM | POA: Insufficient documentation

## 2019-06-13 DIAGNOSIS — I4819 Other persistent atrial fibrillation: Secondary | ICD-10-CM | POA: Diagnosis not present

## 2019-06-13 DIAGNOSIS — I5031 Acute diastolic (congestive) heart failure: Secondary | ICD-10-CM | POA: Diagnosis not present

## 2019-06-13 LAB — BRAIN NATRIURETIC PEPTIDE: B Natriuretic Peptide: 508 pg/mL — ABNORMAL HIGH (ref 0.0–100.0)

## 2019-06-13 LAB — BASIC METABOLIC PANEL
Anion gap: 10 (ref 5–15)
BUN: 31 mg/dL — ABNORMAL HIGH (ref 8–23)
CO2: 27 mmol/L (ref 22–32)
Calcium: 8.7 mg/dL — ABNORMAL LOW (ref 8.9–10.3)
Chloride: 101 mmol/L (ref 98–111)
Creatinine, Ser: 1.4 mg/dL — ABNORMAL HIGH (ref 0.61–1.24)
GFR calc Af Amer: 56 mL/min — ABNORMAL LOW (ref >60)
GFR calc non Af Amer: 48 mL/min — ABNORMAL LOW (ref >60)
Glucose, Bld: 94 mg/dL (ref 70–99)
Potassium: 4.2 mmol/L (ref 3.5–5.1)
Sodium: 138 mmol/L (ref 135–145)

## 2019-06-13 NOTE — Telephone Encounter (Signed)
CSW referred to assist patient with heart healthy meals. Patient reports his wife does not cook and the only time he gets healthy meals is when his daughter brings some meals. He states he and his wife often eat out. CSW was able to get patient a 4 week trial of [employer]. Patient signed up and referral sent. CSW will follow up with patient on start of program after referral accepted. Patient verbalizes understanding and will retrun call if further needs arise. Raquel Sarna, [place], [place]

## 2019-06-13 NOTE — Patient Instructions (Signed)
Medication Instructions:  Your physician recommends that you continue on your current medications as directed. Please refer to the Current Medication list given to you today.   Labwork: TODAY   BMET BNP   Testing/Procedures: NONE  Follow-Up: Your physician recommends that you schedule a follow-up appointment in: 2 MONTHS    Any Other Special Instructions Will Be Listed Below (If Applicable).     If you need a refill on your cardiac medications before your next appointment, please call your pharmacy.

## 2019-06-13 NOTE — Telephone Encounter (Signed)
CSW received message from patient and attempted to retrun call alhtough unable to leave message as mailbox full. CSW will await return call from patient. Raquel Sarna, [place], [place]

## 2019-06-14 ENCOUNTER — Telehealth: Payer: Self-pay | Admitting: *Deleted

## 2019-06-14 ENCOUNTER — Telehealth: Payer: Self-pay | Admitting: Licensed Clinical Social Worker

## 2019-06-14 DIAGNOSIS — I5031 Acute diastolic (congestive) heart failure: Secondary | ICD-10-CM

## 2019-06-14 NOTE — Telephone Encounter (Signed)
CSW contacted patient to inform confirmation of [employer] referral and first delivery will arrive on Friday June 17, 2019. Patient grateful for the support. CSW will continue to follow and be available as needed. Raquel Sarna, [place], [place]

## 2019-06-14 NOTE — Telephone Encounter (Signed)
-----  Message from Imogene Burn, PA-C sent at 06/13/2019  3:41 PM EST ----- Heart failure marker still elevated function a little worse.  Continue with current plan but do not take extra Lasix with worsening kidney function.  2 g sodium diet is really important.  Raquel Sarna and has arranged heart healthy meals.  Repeat be met and BNP in 2 weeks

## 2019-06-16 DIAGNOSIS — R338 Other retention of urine: Secondary | ICD-10-CM | POA: Diagnosis not present

## 2019-06-20 ENCOUNTER — Emergency Department (HOSPITAL_COMMUNITY): Payer: Medicare Other

## 2019-06-20 ENCOUNTER — Encounter (HOSPITAL_COMMUNITY): Payer: Self-pay | Admitting: Emergency Medicine

## 2019-06-20 ENCOUNTER — Other Ambulatory Visit: Payer: Self-pay

## 2019-06-20 ENCOUNTER — Emergency Department (HOSPITAL_COMMUNITY)
Admission: EM | Admit: 2019-06-20 | Discharge: 2019-06-20 | Disposition: A | Discharge status: Home / Self Care | Payer: Medicare Other | Attending: Emergency Medicine | Admitting: Emergency Medicine

## 2019-06-20 DIAGNOSIS — S60812A Abrasion of left wrist, initial encounter: Secondary | ICD-10-CM | POA: Insufficient documentation

## 2019-06-20 DIAGNOSIS — T148XXA Other injury of unspecified body region, initial encounter: Secondary | ICD-10-CM

## 2019-06-20 DIAGNOSIS — Y939 Activity, unspecified: Secondary | ICD-10-CM | POA: Insufficient documentation

## 2019-06-20 DIAGNOSIS — I2510 Atherosclerotic heart disease of native coronary artery without angina pectoris: Secondary | ICD-10-CM | POA: Insufficient documentation

## 2019-06-20 DIAGNOSIS — Z79899 Other long term (current) drug therapy: Secondary | ICD-10-CM | POA: Diagnosis not present

## 2019-06-20 DIAGNOSIS — Z87891 Personal history of nicotine dependence: Secondary | ICD-10-CM | POA: Diagnosis not present

## 2019-06-20 DIAGNOSIS — W19XXXA Unspecified fall, initial encounter: Secondary | ICD-10-CM | POA: Diagnosis not present

## 2019-06-20 DIAGNOSIS — Y999 Unspecified external cause status: Secondary | ICD-10-CM | POA: Diagnosis not present

## 2019-06-20 DIAGNOSIS — M25519 Pain in unspecified shoulder: Secondary | ICD-10-CM | POA: Diagnosis not present

## 2019-06-20 DIAGNOSIS — R519 Headache, unspecified: Secondary | ICD-10-CM | POA: Diagnosis not present

## 2019-06-20 DIAGNOSIS — W010XXA Fall on same level from slipping, tripping and stumbling without subsequent striking against object, initial encounter: Secondary | ICD-10-CM | POA: Diagnosis not present

## 2019-06-20 DIAGNOSIS — Y929 Unspecified place or not applicable: Secondary | ICD-10-CM | POA: Insufficient documentation

## 2019-06-20 DIAGNOSIS — I10 Essential (primary) hypertension: Secondary | ICD-10-CM | POA: Insufficient documentation

## 2019-06-20 DIAGNOSIS — S0990XA Unspecified injury of head, initial encounter: Secondary | ICD-10-CM | POA: Diagnosis not present

## 2019-06-20 DIAGNOSIS — Z23 Encounter for immunization: Secondary | ICD-10-CM | POA: Diagnosis not present

## 2019-06-20 DIAGNOSIS — R52 Pain, unspecified: Secondary | ICD-10-CM | POA: Diagnosis not present

## 2019-06-20 DIAGNOSIS — M25512 Pain in left shoulder: Secondary | ICD-10-CM | POA: Diagnosis not present

## 2019-06-20 DIAGNOSIS — S199XXA Unspecified injury of neck, initial encounter: Secondary | ICD-10-CM | POA: Diagnosis not present

## 2019-06-20 DIAGNOSIS — S43022A Posterior subluxation of left humerus, initial encounter: Secondary | ICD-10-CM | POA: Diagnosis not present

## 2019-06-20 MED ORDER — OXYCODONE-ACETAMINOPHEN 5-325 MG PO TABS
1.0000 | ORAL_TABLET | Freq: Once | ORAL | Status: AC
  Administered 2019-06-20: 16:00:00 1 via ORAL
  Filled 2019-06-20: qty 1

## 2019-06-20 MED ORDER — BACITRACIN ZINC 500 UNIT/GM EX OINT
TOPICAL_OINTMENT | Freq: Once | CUTANEOUS | Status: AC
  Administered 2019-06-20: 1 via TOPICAL
  Filled 2019-06-20: qty 0.9

## 2019-06-20 MED ORDER — HYDROCODONE-ACETAMINOPHEN 5-325 MG PO TABS: 1.0000 | ORAL_TABLET | Freq: Four times a day (QID) | ORAL | 0 refills | Status: DC | PRN

## 2019-06-20 MED ORDER — TETANUS-DIPHTH-ACELL PERTUSSIS 5-2.5-18.5 LF-MCG/0.5 IM SUSP
0.5000 mL | Freq: Once | INTRAMUSCULAR | Status: AC
  Administered 2019-06-20: 0.5 mL via INTRAMUSCULAR
  Filled 2019-06-20: qty 0.5

## 2019-06-20 MED ORDER — FENTANYL CITRATE (PF) 100 MCG/2ML IJ SOLN
100.0000 ug | Freq: Once | INTRAMUSCULAR | Status: AC
  Administered 2019-06-20: 100 ug via INTRAMUSCULAR
  Filled 2019-06-20: qty 2

## 2019-06-20 NOTE — Discharge Instructions (Signed)
You can take 1000 mg of Tylenol.  Do not exceed 4000 mg of Tylenol a day.  Take pain medications as directed for break through pain. Do not drive or operate machinery while taking this medication.   Wear sling for support and stabilization.  Make sure you do not wear it 24/7 as this can cause frozen shoulder.  Do gentle range of motion exercises.  Follow-up with referred orthopedic doctor.  Return the emergency department for any worsening pain, vomiting, difficulty breathing or any other worsening or concerning symptoms.

## 2019-06-20 NOTE — ED Triage Notes (Addendum)
Patient states he was sitting in the car and reached for the door when he fell out of the car landing on his left shoulder. Also states he hit the left side of his head and he is on blood thinners. Denies LOC.
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
## 2019-06-21 ENCOUNTER — Telehealth: Payer: Self-pay | Admitting: Orthopedic Surgery

## 2019-06-21 NOTE — Telephone Encounter (Signed)
Called back to patient, reached voice mail, left message.

## 2019-06-21 NOTE — Telephone Encounter (Signed)
Call from patient following [name] Emergency room visit; requests to see [name]; appointment pending. To call back.

## 2019-06-27 ENCOUNTER — Ambulatory Visit: Payer: Medicare Other | Admitting: Orthopedic Surgery

## 2019-06-27 ENCOUNTER — Other Ambulatory Visit: Payer: Self-pay

## 2019-06-27 VITALS — BP 125/70 | HR 76 | Temp 97.2°F | Ht 73.0 in | Wt 155.0 lb

## 2019-06-27 DIAGNOSIS — S40012A Contusion of left shoulder, initial encounter: Secondary | ICD-10-CM | POA: Diagnosis not present

## 2019-06-27 DIAGNOSIS — S8011XA Contusion of right lower leg, initial encounter: Secondary | ICD-10-CM | POA: Diagnosis not present
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
## 2019-06-27 NOTE — Patient Instructions (Signed)
Observation  

## 2019-07-12 ENCOUNTER — Telehealth: Payer: Self-pay | Admitting: Cardiovascular Disease

## 2019-07-12 ENCOUNTER — Telehealth: Payer: Self-pay | Admitting: Orthopedic Surgery

## 2019-07-12 ENCOUNTER — Other Ambulatory Visit: Payer: Self-pay | Admitting: Orthopedic Surgery

## 2019-07-12 NOTE — Telephone Encounter (Signed)
Patient called regarding what he discussed with [name] at time of visit on 06/27/19 about prescribing medication for pain. States he relayed to [name] the following medication was last prescribed by his primary care, [name], who is retiring:   HYDROcodone-acetaminophen (NORCO/VICODIN) 5-325 MG tablet  Patient states he has not yet 'met with' the provider assuming care of [name]' patients, [name].  Please advise. If medication prescribed, pharmacy is [facility], [place], [place].

## 2019-07-12 NOTE — Telephone Encounter (Signed)
[name] can not take this over for him he wants to know if there is anything else you recommend

## 2019-07-12 NOTE — Telephone Encounter (Signed)
I called patient; relayed, per Dr Ruthe Mannan response. States he will call back to [name]' office. Also relays he will be seeing new primary care, [name], although said has to wait until April for appointment.

## 2019-07-12 NOTE — Telephone Encounter (Signed)
Called Pam, no answer. Unable to leave message as voicemail is full.

## 2019-07-12 NOTE — Telephone Encounter (Signed)
[name] is there until Friday

## 2019-07-12 NOTE — Telephone Encounter (Signed)
Pt's daughter Jeannene Patella left message requesting that someone please call her @ [phone]

## 2019-07-13 NOTE — Telephone Encounter (Signed)
Daughter left a message requesting for someone to please return her call  [phone]

## 2019-07-14 NOTE — Telephone Encounter (Addendum)
Returned pt. Call. Spoke with Pam (daughter) and she had some questions about some of her father's issues. She states that the xarelto is causing him to have diarrhea. She also stated that he is having muscle weakness in his legs and some sob. She saw Dr. [name] last week and asked if he cold do PT and OT . He stated that he would rather his cardiologist make that decision, but did state it would be good for him to do to help him strengthen his legs and help with his gait instability. Pt does not have an appointment with you until Jan 21 and daughter does not want to wait that long to start PT. She asks if a home health nurse could come out and evaluate his needs and then proceed accordingly. Please advise.

## 2019-07-14 NOTE — Telephone Encounter (Signed)
Returned call to daughter. Let her know that we will put in referral for home health. She will keep [place] informed of the diarrhea issues. Pt's insurance prefers xarelto over eliquis. She will call in January when he gets new insurance and they may go back to eliquis.

## 2019-07-14 NOTE — Telephone Encounter (Signed)
Home health would be fine.  He was previously taking apixaban.  Would he be okay going back to this?

## 2019-07-19 NOTE — Telephone Encounter (Signed)
Pt called the office wanting to know which Home Health would be taking him on as a client, he called [facility] and was told they have not received any orders to see him.

## 2019-07-19 NOTE — Telephone Encounter (Signed)
The [facility] rep has not came by here to get the information needed this week. I will call her to let her know we have a patient in need of their services.

## 2019-07-21 ENCOUNTER — Telehealth: Payer: Self-pay | Admitting: *Deleted

## 2019-07-21 NOTE — Telephone Encounter (Signed)
Office notes faxed to [facility]

## 2019-07-24 DIAGNOSIS — R269 Unspecified abnormalities of gait and mobility: Secondary | ICD-10-CM | POA: Insufficient documentation

## 2019-07-24 DIAGNOSIS — N181 Chronic kidney disease, stage 1: Secondary | ICD-10-CM

## 2019-07-24 DIAGNOSIS — R197 Diarrhea, unspecified: Secondary | ICD-10-CM | POA: Insufficient documentation

## 2019-07-24 DIAGNOSIS — I4891 Unspecified atrial fibrillation: Secondary | ICD-10-CM | POA: Insufficient documentation

## 2019-07-24 DIAGNOSIS — M5030 Other cervical disc degeneration, unspecified cervical region: Secondary | ICD-10-CM | POA: Insufficient documentation

## 2019-07-24 DIAGNOSIS — F419 Anxiety disorder, unspecified: Secondary | ICD-10-CM | POA: Insufficient documentation

## 2019-07-24 DIAGNOSIS — M545 Low back pain, unspecified: Secondary | ICD-10-CM | POA: Insufficient documentation

## 2019-07-24 DIAGNOSIS — K219 Gastro-esophageal reflux disease without esophagitis: Secondary | ICD-10-CM | POA: Insufficient documentation

## 2019-07-24 DIAGNOSIS — I10 Essential (primary) hypertension: Secondary | ICD-10-CM | POA: Insufficient documentation

## 2019-07-24 DIAGNOSIS — E785 Hyperlipidemia, unspecified: Secondary | ICD-10-CM | POA: Insufficient documentation

## 2019-07-24 DIAGNOSIS — N419 Inflammatory disease of prostate, unspecified: Secondary | ICD-10-CM | POA: Insufficient documentation

## 2019-07-24 DIAGNOSIS — R339 Retention of urine, unspecified: Secondary | ICD-10-CM | POA: Insufficient documentation

## 2019-07-24 DIAGNOSIS — M1993 Secondary osteoarthritis, unspecified site: Secondary | ICD-10-CM | POA: Insufficient documentation

## 2019-07-24 DIAGNOSIS — I5032 Chronic diastolic (congestive) heart failure: Secondary | ICD-10-CM | POA: Insufficient documentation

## 2019-07-24 DIAGNOSIS — G834 Cauda equina syndrome: Secondary | ICD-10-CM

## 2019-07-24 DIAGNOSIS — R040 Epistaxis: Secondary | ICD-10-CM

## 2019-07-24 DIAGNOSIS — I2510 Atherosclerotic heart disease of native coronary artery without angina pectoris: Secondary | ICD-10-CM | POA: Insufficient documentation

## 2019-07-24 DIAGNOSIS — R739 Hyperglycemia, unspecified: Secondary | ICD-10-CM | POA: Insufficient documentation

## 2019-07-24 DIAGNOSIS — R600 Localized edema: Secondary | ICD-10-CM | POA: Insufficient documentation

## 2019-07-25 ENCOUNTER — Telehealth: Payer: Self-pay

## 2019-07-25 ENCOUNTER — Telehealth: Payer: Self-pay | Admitting: Pulmonary Disease

## 2019-07-25 DIAGNOSIS — R0602 Shortness of breath: Secondary | ICD-10-CM

## 2019-07-25 DIAGNOSIS — I5032 Chronic diastolic (congestive) heart failure: Secondary | ICD-10-CM

## 2019-07-25 DIAGNOSIS — I5031 Acute diastolic (congestive) heart failure: Secondary | ICD-10-CM

## 2019-07-25 DIAGNOSIS — I2510 Atherosclerotic heart disease of native coronary artery without angina pectoris: Secondary | ICD-10-CM

## 2019-07-25 NOTE — Telephone Encounter (Signed)
Information was sent to [facility] on 12/24 by [facility], LPN. Referral was placed.

## 2019-07-25 NOTE — Telephone Encounter (Signed)
Patient daughter [name] came into the office this morning to pick up the patients records as he is unable to do so for health reasons, okay per practice [name], I took a verbal from the patient to release records to his daughter [name].

## 2019-07-25 NOTE — Telephone Encounter (Signed)
Spoke with [facility] they are missing the following to be able to get things going....  Orders from Doctor and who will be signing the orders.    They really need for this to start being porcessed due to her mother not being able to take care of her patient.       Please call Pam today with update.Marland KitchenMarland Kitchen

## 2019-07-26 ENCOUNTER — Telehealth: Payer: Self-pay | Admitting: Cardiovascular Disease

## 2019-07-26 DIAGNOSIS — I5033 Acute on chronic diastolic (congestive) heart failure: Secondary | ICD-10-CM | POA: Diagnosis not present

## 2019-07-26 DIAGNOSIS — Z8614 Personal history of Methicillin resistant Staphylococcus aureus infection: Secondary | ICD-10-CM | POA: Diagnosis not present

## 2019-07-26 DIAGNOSIS — Z9181 History of falling: Secondary | ICD-10-CM | POA: Diagnosis not present

## 2019-07-26 DIAGNOSIS — Z955 Presence of coronary angioplasty implant and graft: Secondary | ICD-10-CM | POA: Diagnosis not present

## 2019-07-26 DIAGNOSIS — M19012 Primary osteoarthritis, left shoulder: Secondary | ICD-10-CM | POA: Diagnosis not present

## 2019-07-26 DIAGNOSIS — K219 Gastro-esophageal reflux disease without esophagitis: Secondary | ICD-10-CM | POA: Diagnosis not present

## 2019-07-26 DIAGNOSIS — M6281 Muscle weakness (generalized): Secondary | ICD-10-CM | POA: Diagnosis not present

## 2019-07-26 DIAGNOSIS — Z951 Presence of aortocoronary bypass graft: Secondary | ICD-10-CM | POA: Diagnosis not present

## 2019-07-26 DIAGNOSIS — Z87891 Personal history of nicotine dependence: Secondary | ICD-10-CM | POA: Diagnosis not present

## 2019-07-26 DIAGNOSIS — Z7901 Long term (current) use of anticoagulants: Secondary | ICD-10-CM | POA: Diagnosis not present

## 2019-07-26 DIAGNOSIS — I4819 Other persistent atrial fibrillation: Secondary | ICD-10-CM | POA: Diagnosis not present

## 2019-07-26 DIAGNOSIS — D509 Iron deficiency anemia, unspecified: Secondary | ICD-10-CM | POA: Diagnosis not present

## 2019-07-26 DIAGNOSIS — Z7952 Long term (current) use of systemic steroids: Secondary | ICD-10-CM | POA: Diagnosis not present

## 2019-07-26 DIAGNOSIS — S8011XD Contusion of right lower leg, subsequent encounter: Secondary | ICD-10-CM | POA: Diagnosis not present

## 2019-07-26 DIAGNOSIS — F419 Anxiety disorder, unspecified: Secondary | ICD-10-CM | POA: Diagnosis not present

## 2019-07-26 DIAGNOSIS — Z8739 Personal history of other diseases of the musculoskeletal system and connective tissue: Secondary | ICD-10-CM | POA: Diagnosis not present

## 2019-07-26 DIAGNOSIS — I110 Hypertensive heart disease with heart failure: Secondary | ICD-10-CM | POA: Diagnosis not present

## 2019-07-26 DIAGNOSIS — W19XXXD Unspecified fall, subsequent encounter: Secondary | ICD-10-CM | POA: Diagnosis not present

## 2019-07-26 DIAGNOSIS — E785 Hyperlipidemia, unspecified: Secondary | ICD-10-CM | POA: Diagnosis not present

## 2019-07-26 DIAGNOSIS — M109 Gout, unspecified: Secondary | ICD-10-CM | POA: Diagnosis not present

## 2019-07-26 DIAGNOSIS — Z79899 Other long term (current) drug therapy: Secondary | ICD-10-CM | POA: Diagnosis not present

## 2019-07-26 DIAGNOSIS — Z8701 Personal history of pneumonia (recurrent): Secondary | ICD-10-CM | POA: Diagnosis not present

## 2019-07-26 DIAGNOSIS — Z862 Personal history of diseases of the blood and blood-forming organs and certain disorders involving the immune mechanism: Secondary | ICD-10-CM | POA: Diagnosis not present

## 2019-07-26 DIAGNOSIS — I2510 Atherosclerotic heart disease of native coronary artery without angina pectoris: Secondary | ICD-10-CM | POA: Diagnosis not present

## 2019-07-26 DIAGNOSIS — S40012D Contusion of left shoulder, subsequent encounter: Secondary | ICD-10-CM | POA: Diagnosis not present

## 2019-07-26 NOTE — Telephone Encounter (Signed)
Not taking Xalreto and Lasix as ordered

## 2019-07-26 NOTE — Telephone Encounter (Signed)
I left a message for Larene Beach at [facility] that we do not refill narcotics but I will forward the request for skilled home care

## 2019-07-26 NOTE — Telephone Encounter (Signed)
Called asking for verbal orders for skilled homecare  2x week for 3 weeks, 1 x week for 6 weeks. Also asking if Pain meds could be refilled. Patient is in between PCP due to [name] retiring and his new doctor [name] will not fill until he seems him until his first visit

## 2019-07-27 DIAGNOSIS — I2510 Atherosclerotic heart disease of native coronary artery without angina pectoris: Secondary | ICD-10-CM | POA: Diagnosis not present

## 2019-07-27 DIAGNOSIS — I110 Hypertensive heart disease with heart failure: Secondary | ICD-10-CM | POA: Diagnosis not present

## 2019-07-27 DIAGNOSIS — I4819 Other persistent atrial fibrillation: Secondary | ICD-10-CM | POA: Diagnosis not present

## 2019-07-27 DIAGNOSIS — S8011XD Contusion of right lower leg, subsequent encounter: Secondary | ICD-10-CM | POA: Diagnosis not present

## 2019-07-27 DIAGNOSIS — I5033 Acute on chronic diastolic (congestive) heart failure: Secondary | ICD-10-CM | POA: Diagnosis not present

## 2019-07-27 DIAGNOSIS — S40012D Contusion of left shoulder, subsequent encounter: Secondary | ICD-10-CM | POA: Diagnosis not present

## 2019-07-27 NOTE — Telephone Encounter (Signed)
Left VM for Amy at [facility] with Dr.Branch's reply.

## 2019-07-27 NOTE — Telephone Encounter (Signed)
Amy with [facility] asking for the verbal orders for patient  [phone]

## 2019-07-27 NOTE — Telephone Encounter (Signed)
Any cardiac meds we can fill, other meds would have to come from new pcp. Needs to work to get earlier appointment with [facility], as homecare orders will need to be arranged by pcp as well   Zandra Abts MD

## 2019-07-28 ENCOUNTER — Emergency Department (HOSPITAL_COMMUNITY)
Admission: EM | Admit: 2019-07-28 | Discharge: 2019-07-28 | Disposition: A | Discharge status: Home / Self Care | Payer: Medicare Other | Attending: Emergency Medicine | Admitting: Emergency Medicine

## 2019-07-28 ENCOUNTER — Other Ambulatory Visit: Payer: Self-pay

## 2019-07-28 ENCOUNTER — Encounter (HOSPITAL_COMMUNITY): Payer: Self-pay

## 2019-07-28 ENCOUNTER — Emergency Department (HOSPITAL_COMMUNITY): Payer: Medicare Other

## 2019-07-28 DIAGNOSIS — R197 Diarrhea, unspecified: Secondary | ICD-10-CM | POA: Diagnosis not present

## 2019-07-28 DIAGNOSIS — I129 Hypertensive chronic kidney disease with stage 1 through stage 4 chronic kidney disease, or unspecified chronic kidney disease: Secondary | ICD-10-CM | POA: Diagnosis not present

## 2019-07-28 DIAGNOSIS — Z87891 Personal history of nicotine dependence: Secondary | ICD-10-CM | POA: Diagnosis not present

## 2019-07-28 DIAGNOSIS — R0902 Hypoxemia: Secondary | ICD-10-CM | POA: Diagnosis not present

## 2019-07-28 DIAGNOSIS — R031 Nonspecific low blood-pressure reading: Secondary | ICD-10-CM

## 2019-07-28 DIAGNOSIS — I2510 Atherosclerotic heart disease of native coronary artery without angina pectoris: Secondary | ICD-10-CM | POA: Insufficient documentation

## 2019-07-28 DIAGNOSIS — N181 Chronic kidney disease, stage 1: Secondary | ICD-10-CM | POA: Diagnosis not present

## 2019-07-28 DIAGNOSIS — I959 Hypotension, unspecified: Secondary | ICD-10-CM | POA: Insufficient documentation

## 2019-07-28 DIAGNOSIS — S8011XD Contusion of right lower leg, subsequent encounter: Secondary | ICD-10-CM | POA: Diagnosis not present

## 2019-07-28 DIAGNOSIS — D649 Anemia, unspecified: Secondary | ICD-10-CM | POA: Diagnosis not present

## 2019-07-28 DIAGNOSIS — I5033 Acute on chronic diastolic (congestive) heart failure: Secondary | ICD-10-CM | POA: Diagnosis not present

## 2019-07-28 DIAGNOSIS — I110 Hypertensive heart disease with heart failure: Secondary | ICD-10-CM | POA: Diagnosis not present

## 2019-07-28 DIAGNOSIS — I4819 Other persistent atrial fibrillation: Secondary | ICD-10-CM | POA: Diagnosis not present

## 2019-07-28 DIAGNOSIS — Z79899 Other long term (current) drug therapy: Secondary | ICD-10-CM | POA: Insufficient documentation

## 2019-07-28 DIAGNOSIS — S40012D Contusion of left shoulder, subsequent encounter: Secondary | ICD-10-CM | POA: Diagnosis not present

## 2019-07-28 DIAGNOSIS — E860 Dehydration: Secondary | ICD-10-CM | POA: Diagnosis not present

## 2019-07-28 DIAGNOSIS — R001 Bradycardia, unspecified: Secondary | ICD-10-CM | POA: Diagnosis not present

## 2019-07-28 LAB — CBC WITH DIFFERENTIAL/PLATELET
Abs Immature Granulocytes: 0.03 10*3/uL (ref 0.00–0.07)
Basophils Absolute: 0 10*3/uL (ref 0.0–0.1)
Basophils Relative: 1 %
Eosinophils Absolute: 0.5 10*3/uL (ref 0.0–0.5)
Eosinophils Relative: 7 %
HCT: 29.1 % — ABNORMAL LOW (ref 39.0–52.0)
Hemoglobin: 8.8 g/dL — ABNORMAL LOW (ref 13.0–17.0)
Immature Granulocytes: 0 %
Lymphocytes Relative: 12 %
Lymphs Abs: 0.9 10*3/uL (ref 0.7–4.0)
MCH: 28.9 pg (ref 26.0–34.0)
MCHC: 30.2 g/dL (ref 30.0–36.0)
MCV: 95.4 fL (ref 80.0–100.0)
Monocytes Absolute: 0.6 10*3/uL (ref 0.1–1.0)
Monocytes Relative: 8 %
Neutro Abs: 5.5 10*3/uL (ref 1.7–7.7)
Neutrophils Relative %: 72 %
Platelets: 266 10*3/uL (ref 150–400)
RBC: 3.05 MIL/uL — ABNORMAL LOW (ref 4.22–5.81)
RDW: 16.5 % — ABNORMAL HIGH (ref 11.5–15.5)
WBC: 7.6 10*3/uL (ref 4.0–10.5)
nRBC: 0 % (ref 0.0–0.2)

## 2019-07-28 LAB — COMPREHENSIVE METABOLIC PANEL
ALT: 10 U/L (ref 0–44)
AST: 13 U/L — ABNORMAL LOW (ref 15–41)
Albumin: 2.6 g/dL — ABNORMAL LOW (ref 3.5–5.0)
Alkaline Phosphatase: 87 U/L (ref 38–126)
Anion gap: 11 (ref 5–15)
BUN: 34 mg/dL — ABNORMAL HIGH (ref 8–23)
CO2: 22 mmol/L (ref 22–32)
Calcium: 8.4 mg/dL — ABNORMAL LOW (ref 8.9–10.3)
Chloride: 102 mmol/L (ref 98–111)
Creatinine, Ser: 1.93 mg/dL — ABNORMAL HIGH (ref 0.61–1.24)
GFR calc Af Amer: 38 mL/min — ABNORMAL LOW (ref >60)
GFR calc non Af Amer: 33 mL/min — ABNORMAL LOW (ref >60)
Glucose, Bld: 185 mg/dL — ABNORMAL HIGH (ref 70–99)
Potassium: 4.5 mmol/L (ref 3.5–5.1)
Sodium: 135 mmol/L (ref 135–145)
Total Bilirubin: 0.6 mg/dL (ref 0.3–1.2)
Total Protein: 5 g/dL — ABNORMAL LOW (ref 6.5–8.1)

## 2019-07-28 LAB — LACTIC ACID, PLASMA: Lactic Acid, Venous: 1.2 mmol/L (ref 0.5–1.9)

## 2019-07-28 MED ORDER — SODIUM CHLORIDE 0.9 % IV BOLUS
500.0000 mL | Freq: Once | INTRAVENOUS | Status: AC
  Administered 2019-07-28: 500 mL via INTRAVENOUS

## 2019-07-28 MED ORDER — SODIUM CHLORIDE 0.9 % IV SOLN: INTRAVENOUS | Status: DC

## 2019-07-28 NOTE — ED Provider Notes (Signed)
[facility] EMERGENCY DEPARTMENT Provider Note   CSN: [id number] Arrival date & time: 07/28/19  1021     History Chief Complaint  Patient presents with  . Hypotension    [name] is a 78 y.o. male.  Patient brought in by EMS.  Home health nurse reports blood pressure was 75/45.  Patient states that sometimes he holds his blood pressure medicine usually checks his blood pressure in the morning but today he just took it.  When EMS got there they reported blood pressure 117/57.  They reported the patient had nausea and diarrhea and weight loss over the past 2 weeks.  Patient denies any of that.  Son told EMS the patient had blood in the stool for the past week or so.  Patient denies any gross blood.  Patient denies any complaints at all.  Wants to go home.        Past Medical History:  Diagnosis Date  . Acute kidney failure, unspecified (HCC)   . Acute renal failure (HCC) 04/17/2013  . Anemia, iron deficiency 09/09/2013  . Anxiety   . Anxiety disorder, unspecified   . Arthritis   . Atherosclerotic heart disease of native coronary artery without angina pectoris   . Atrial fibrillation (HCC)   . 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 Cauda equina syndrome (HCC)   . Chronic diastolic (congestive) heart failure (HCC)   . Chronic kidney disease, stage 1   . Coronary artery disease    a. s/p CABG in 2000 with low-risk NST in 01/2017  . Diarrhea, unspecified   . Disorder of blood    BEEN TREATED BY DERMATOLOGIST X 4 YRS..."BLOOD BLISTERS"  . Epistaxis   . Essential (primary) hypertension   . Gastro-esophageal reflux disease without esophagitis   . Gout   . Gout, unspecified   . HTN (hypertension)    sees [name] in [place]  . Hx MRSA infection    rt shoulder  . Hyperglycemia, unspecified   . Hyperlipidemia   . Hyperlipidemia, unspecified   . Inflammatory disease of prostate, unspecified   . Iron deficiency anemia, unspecified   .  Localized edema   . Low back pain   . Osteonecrosis, unspecified (HCC)   . Other cervical disc degeneration, unspecified cervical region   . Personal history of Methicillin resistant Staphylococcus aureus infection   . Pneumonia    "I've had it 3-4 times"  . Pneumonia, unspecified organism   . Retention of urine, unspecified   . Secondary osteoarthritis, unspecified site   . Small bowel obstruction (HCC)   . Small bowel problem    HAD ALOT OF SCAR TISSUE FROM PREVIOUS SURGERIES..NG WAS INSERTED ...Marland KitchenMarland KitchenNO SURGERY NEEDED...Marland KitchenMarland KitchenIN FOR 8 DAYS  . Unspecified abnormalities of gait and mobility   . Unspecified atrial fibrillation (HCC)   . Ventral hernia   . Ventral hernia without obstruction or gangrene     Patient Active Problem List   Diagnosis Date Noted  . Hyperglycemia, unspecified   . Cauda equina syndrome (HCC)   . Low back pain   . Atherosclerotic heart disease of native coronary artery without angina pectoris   . Essential (primary) hypertension   . Gastro-esophageal reflux disease without esophagitis   . Hyperlipidemia, unspecified   . Secondary osteoarthritis, unspecified site   . Anxiety disorder, unspecified   . Chronic diastolic (congestive) heart failure (HCC)   . Chronic kidney disease, stage 1   . Diarrhea, unspecified   .  Epistaxis   . Inflammatory disease of prostate, unspecified   . Localized edema   . Other cervical disc degeneration, unspecified cervical region   . Retention of urine, unspecified   . Unspecified abnormalities of gait and mobility   . Unspecified atrial fibrillation ([place])   . Persistent atrial fibrillation ([place]) 05/30/2019  . 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 Infection of urinary tract 10/26/2015  . Intractable back pain 10/22/2015  . Back pain 10/22/2015  . Muscle weakness (generalized) 12/05/2013  . Tight fascia 12/05/2013  . Decreased range of motion of right shoulder 12/05/2013  . Decreased range of motion of left  shoulder 12/05/2013  . Bilateral leg weakness 11/21/2013  . Poor balance 11/21/2013  . Difficulty in walking(719.7) 11/21/2013  . Shoulder pain, left 09/12/2013  . Anemia, iron deficiency 09/09/2013  . Dyspnea 09/07/2013  . UTI (urinary tract infection) 09/07/2013  . Acute CHF (congestive heart failure) ([place]) 09/07/2013  . Elevated troponin 09/07/2013  . Fever and chills 09/06/2013  . Altered mental status 09/05/2013  . Neurogenic bowel 08/22/2013  . 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 Gout flare 08/22/2013  . Postoperative wound infection 08/22/2013  . Lumbar degenerative disc disease 08/02/2013  . Post-operative pain 07/29/2013  . Osteonecrosis ([place]) 05/05/2013  . Small bowel obstruction ([place]) 04/17/2013  . Acute renal failure ([place]) 04/17/2013  . Medial meniscus, posterior horn derangement 02/17/2013  . Knee sprain and strain 02/17/2013  . Trigger point with neck pain 03/18/2012  . Rotator cuff tear arthropathy of right shoulder 09/08/2011  . CAD in native artery 01/09/2011  . Ankle fracture 12/25/2010  . Contusion of shoulder, left 12/25/2010  . PEMPHIGUS VULGARIS 07/02/2010  . MRSA 05/13/2010  . PRURITUS 05/13/2010  . SPINAL STENOSIS, LUMBAR 05/13/2010  . HLD (hyperlipidemia) 04/24/2010  . GASTROESOPHAGEAL REFLUX DISEASE 04/24/2010  . VENTRAL HERNIA, INCISIONAL 04/24/2010  . DEGENERATIVE JOINT DISEASE 04/24/2010  . PYOGENIC ARTHRITIS, SHOULDER REGION 02/04/2010  . Essential hypertension 01/01/2010  . RUPTURE ROTATOR CUFF 02/19/2009    Past Surgical History:  Procedure Laterality Date  . ANTERIOR CERVICAL CORPECTOMY  12/17/11  . ANTERIOR CERVICAL CORPECTOMY  12/17/2011   Procedure: ANTERIOR CERVICAL CORPECTOMY;  Surgeon: Floyce Stakes, MD;  Location: [place] [facility];  Service: Neurosurgery;  Laterality: N/A;  Anterior Cervical Decompression Fusion Five to Thoracic Two with plating  . BACK SURGERY     lumbar  . CATARACT EXTRACTION W/  INTRAOCULAR LENS  IMPLANT, BILATERAL  ? 2011  . CHOLECYSTECTOMY  2006 "or after"  . CORONARY ANGIOPLASTY WITH STENT PLACEMENT  2012  . CORONARY ARTERY BYPASS GRAFT  2000   CABG X5  . EYE SURGERY     bilateral cataract  . INCISION AND DRAINAGE OF WOUND  ~ 20ll; 12/03/11   "had infection in my right"  . LUMBAR LAMINECTOMY/DECOMPRESSION MICRODISCECTOMY  06/13/2011   Procedure: LUMBAR LAMINECTOMY/DECOMPRESSION MICRODISCECTOMY;  Surgeon: Floyce Stakes;  Location: [place] [facility];  Service: Neurosurgery;  Laterality: N/A;  Lumbar three, lumbar four-five Laminectomy  . LUMBAR LAMINECTOMY/DECOMPRESSION MICRODISCECTOMY Right 06/17/2013   Procedure: LUMBAR LAMINECTOMY/DECOMPRESSION MICRODISCECTOMY 1 LEVEL;  Surgeon: Floyce Stakes, MD;  Location: [place] [facility];  Service: Neurosurgery;  Laterality: Right;  Right L3-4 Microdiskectomy  . LUMBAR WOUND DEBRIDEMENT N/A 08/02/2013   Procedure: INCISION AND DRAINAGE OF LUMBAR WOUND DEBRIDEMENT;  Surgeon: Floyce Stakes, MD;  Location: [facility];  Service: Neurosurgery;  Laterality: N/A;  . [place] CATHETER INSERTION  2011 &  11/2011  . SHOULDER ARTHROSCOPY Left 09/13/2013   Procedure: ARTHROSCOPIC IRRIGATION AND DEBRIDEMENT, SYNOVECTOMY ,  LEFT SHOULDER ;  Surgeon: Thera Flake., MD;  Location: [facility] OR;  Service: Orthopedics;  Laterality: Left;  . SHOULDER OPEN ROTATOR CUFF REPAIR  ~ 2011   right  . STERNAL SURG  2000   HAS HAD 5-6 ON HIS STERNUM; "caught MRSA in it"  . TONSILLECTOMY  1949       Family History  Problem Relation Age of Onset  . Heart disease Other   . Hypertension Mother   . Hypertension Maternal Aunt   . Hypertension Maternal Uncle   . Anesthesia problems Neg Hx   . Hypotension Neg Hx   . Malignant hyperthermia Neg Hx   . Pseudochol deficiency Neg Hx     Social History   Tobacco Use  . Smoking status: Former Smoker    Packs/day: 1.00    Years: 1.00    Pack years: 1.00    Types: Cigarettes    Start  date: 12/29/1959    Quit date: 12/27/1998    Years since quitting: 20.5  . Smokeless tobacco: Never Used  Substance Use Topics  . Alcohol use: No    Alcohol/week: 0.0 standard drinks  . Drug use: No    Home Medications Prior to Admission medications   Medication Sig Start Date End Date Taking? Authorizing Provider  acetaminophen (TYLENOL) 325 MG tablet Take 1-2 tablets (325-650 mg total) by mouth every 4 (four) hours as needed for mild pain. 08/19/13   Love, Evlyn Kanner, PA-C  ALPRAZolam Prudy Feeler) 0.5 MG tablet Take 0.25-0.5 mg by mouth 2 (two) times daily as needed for anxiety.    [provider]  amoxicillin-clavulanate (AUGMENTIN) 500-125 MG tablet Take by mouth.    [provider]  atorvastatin (LIPITOR) 40 MG tablet TAKE 1 TABLET BY MOUTH EVERY DAY 09/03/15   Laqueta Linden, MD  carvedilol (COREG) 25 MG tablet Take 25 mg by mouth 2 (two) times daily with a meal.    [provider]  docusate sodium (COLACE) 100 MG capsule Take 100 mg by mouth daily.    [provider]  doxazosin (CARDURA) 8 MG tablet Take 8 mg by mouth daily.    [provider]  finasteride (PROSCAR) 5 MG tablet Take 5 mg by mouth daily.    [provider]  furosemide (LASIX) 20 MG tablet Take 1 tablet (20 mg total) by mouth daily. 05/31/19 08/29/19  Dyann Kief, PA-C  HYDROcodone-acetaminophen (NORCO/VICODIN) 5-325 MG tablet Take 1-2 tablets by mouth every 6 (six) hours as needed. 06/20/19   Maxwell Caul, PA-C  Multiple Vitamin (MULTIVITAMIN) tablet Take 1 tablet by mouth daily.    [provider]  nitroGLYCERIN (NITROSTAT) 0.4 MG SL tablet Place 0.4 mg under the tongue every 5 (five) minutes as needed for chest pain. May take up to 3 doses per episode. Contact MD or 911 if unresolved chest pain continues. 01/09/11   Jodelle Gross, NP  predniSONE (DELTASONE) 10 MG tablet Take 5-10 mg by mouth as directed. 01/06/18   [provider]    rivaroxaban (XARELTO) 20 MG TABS tablet Take 20 mg by mouth daily with supper.    [provider]  tamsulosin (FLOMAX) 0.4 MG CAPS capsule Take 0.4 mg by mouth.    [provider]  traMADol (ULTRAM) 50 MG tablet Take 50 mg by mouth every 6 (six) hours as needed.    [provider]    Allergies    Morphine, Metoprolol, and Rifampin  Review of Systems   Review of Systems  Constitutional: Negative for chills and fever.  HENT: Negative for congestion, rhinorrhea and sore throat.   Eyes: Negative for visual disturbance.  Respiratory: Negative for cough and shortness of breath.   Cardiovascular: Negative for chest pain and leg swelling.  Gastrointestinal: Negative for abdominal pain, diarrhea, nausea and vomiting.  Genitourinary: Negative for dysuria.  Musculoskeletal: Negative for back pain and neck pain.  Skin: Negative for rash.  Neurological: Negative for dizziness, light-headedness and headaches.  Hematological: Does not bruise/bleed easily.  Psychiatric/Behavioral: Negative for confusion.    Physical Exam Updated Vital Signs BP 134/74 (BP Location: Right Arm)   Pulse 97   Temp 98 F (36.7 C) (Oral)   Ht 1.829 m (6')   Wt 72.6 kg   SpO2 98%   BMI 21.70 kg/m   Physical Exam Vitals and nursing note reviewed.  Constitutional:      General: He is not in acute distress.    Appearance: Normal appearance. He is well-developed.  HENT:     Head: Normocephalic and atraumatic.  Eyes:     Extraocular Movements: Extraocular movements intact.     Conjunctiva/sclera: Conjunctivae normal.     Pupils: Pupils are equal, round, and reactive to light.  Cardiovascular:     Rate and Rhythm: Normal rate and regular rhythm.     Heart sounds: No murmur.  Pulmonary:     Effort: Pulmonary effort is normal. No respiratory distress.     Breath sounds: Normal breath sounds.  Abdominal:     Palpations: Abdomen is soft.     Tenderness: There is no abdominal  tenderness.  Genitourinary:    Comments: Trace heme positive not melanotic not grossly bloody. Musculoskeletal:        General: Normal range of motion.     Cervical back: Neck supple.  Skin:    General: Skin is warm and dry.     Capillary Refill: Capillary refill takes less than 2 seconds.  Neurological:     General: No focal deficit present.     Mental Status: He is alert and oriented to person, place, and time.     ED Results / Procedures / Treatments   Labs (all labs ordered are listed, but only abnormal results are displayed) Labs Reviewed  CBC WITH DIFFERENTIAL/PLATELET - Abnormal; Notable for the following components:      Result Value   RBC 3.05 (*)    Hemoglobin 8.8 (*)    HCT 29.1 (*)    RDW 16.5 (*)    All other components within normal limits  COMPREHENSIVE METABOLIC PANEL - Abnormal; Notable for the following components:   Glucose, Bld 185 (*)    BUN 34 (*)    Creatinine, Ser 1.93 (*)    Calcium 8.4 (*)    Total Protein 5.0 (*)    Albumin 2.6 (*)    AST 13 (*)    GFR calc non Af Amer 33 (*)    GFR calc Af Amer 38 (*)    All other components within normal limits  CULTURE, BLOOD (ROUTINE X 2)  CULTURE, BLOOD (ROUTINE X 2)  LACTIC ACID, PLASMA  URINALYSIS, ROUTINE W REFLEX MICROSCOPIC    EKG EKG Interpretation  Date/Time:  Thursday July 28 2019 12:51:30 EST Ventricular Rate:  64 PR Interval:    QRS Duration: 84 QT Interval:  396 QTC Calculation: 408 R Axis:  51 Text Interpretation: Atrial fibrillation with premature ventricular or aberrantly conducted complexes Septal infarct , age undetermined Abnormal ECG Confirmed by Vanetta MuldersZackowski, Marcelia Petersen [id number]) on 07/28/2019 1:05:56 PM   Radiology DG Chest 2 View  Result Date: 07/28/2019 CLINICAL DATA:  Hypotension EXAM: CHEST - 2 VIEW COMPARISON:  09/07/2013 FINDINGS: Cardiac shadow is within normal limits. Postsurgical changes are again seen. Calcified granulomas are noted particularly in the right lung  base. Blunting of the right costophrenic angle related to scarring is again seen and slightly improved. Postsurgical changes in the thoracolumbar and cervicothoracic junction are seen. No focal infiltrate is noted. IMPRESSION: Chronic scarring in the right lung base. No acute abnormality noted. Electronically Signed   By: Alcide CleverMark  Lukens M.D.   On: 07/28/2019 12:38    Procedures Procedures (including critical care time)  Medications Ordered in ED Medications  0.9 %  sodium chloride infusion ( Intravenous New Bag/Given 07/28/19 1254)  sodium chloride 0.9 % bolus 500 mL (500 mLs Intravenous New Bag/Given 07/28/19 1253)    ED Course  I have reviewed the triage vital signs and the nursing notes.  Pertinent labs & imaging results that were available during my care of the patient were reviewed by me and considered in my medical decision making (see chart for details).    MDM Rules/Calculators/A&P                      Patient's blood pressure manually here has been systolics in the 100s.  Patient's labs are any significant abnormality other than there is some anemia.  Hemoglobin 8.8.  Will need to have that followed up.  No gross blood.  Tachycardic.  Will give patient referral to GI medicine for follow-up.   Patient probably has some slow GI blood loss.   Final Clinical Impression(s) / ED Diagnoses Final diagnoses:  Low blood pressure reading    Rx / DC Orders ED Discharge Orders    None       Vanetta MuldersZackowski, Tremond Shimabukuro, MD 07/28/19 1458

## 2019-07-28 NOTE — ED Triage Notes (Signed)
EMS reports pt's home health nurse called and reports bp was 75/45.  EMS arrived and reports bp 117/57 after moving pt around.  Reports pt has had nausea, diarrhea, and weight loss of the past 2 weeks.  Unable to obtain o2 sat because hands were cold.  Reports pt pale.  Son told ems pt has had blood in stool for the past week or so.

## 2019-07-28 NOTE — Discharge Instructions (Addendum)
Patient's labs without any acute findings requiring admission.  Blood pressures here manually have had systolics above 355 including from EMS.  Patient's lactic acid not elevated.  Do not feel patient had any significant prolonged period of hypotension.  Patient stable to go home.  Needs to have his hemoglobin rechecked in about 2 weeks.  Since it was on the low side but not requiring blood transfusion.  Call the GI doctor for follow-up on the low hemoglobin and the history of some blood in the bowel movements according to your son.  Return for any new or worse bleeding or any red blood in the bowel movements x2 in a day.

## 2019-08-01 DIAGNOSIS — R269 Unspecified abnormalities of gait and mobility: Secondary | ICD-10-CM | POA: Diagnosis not present

## 2019-08-01 DIAGNOSIS — Z0189 Encounter for other specified special examinations: Secondary | ICD-10-CM | POA: Diagnosis not present

## 2019-08-01 DIAGNOSIS — I2510 Atherosclerotic heart disease of native coronary artery without angina pectoris: Secondary | ICD-10-CM | POA: Diagnosis not present

## 2019-08-01 DIAGNOSIS — G894 Chronic pain syndrome: Secondary | ICD-10-CM | POA: Diagnosis not present

## 2019-08-01 DIAGNOSIS — N319 Neuromuscular dysfunction of bladder, unspecified: Secondary | ICD-10-CM | POA: Diagnosis not present

## 2019-08-01 DIAGNOSIS — I509 Heart failure, unspecified: Secondary | ICD-10-CM | POA: Diagnosis not present

## 2019-08-01 DIAGNOSIS — R197 Diarrhea, unspecified: Secondary | ICD-10-CM | POA: Diagnosis not present

## 2019-08-01 DIAGNOSIS — I482 Chronic atrial fibrillation, unspecified: Secondary | ICD-10-CM | POA: Diagnosis not present

## 2019-08-01 DIAGNOSIS — E785 Hyperlipidemia, unspecified: Secondary | ICD-10-CM | POA: Diagnosis not present

## 2019-08-01 DIAGNOSIS — R531 Weakness: Secondary | ICD-10-CM | POA: Diagnosis not present

## 2019-08-01 DIAGNOSIS — I10 Essential (primary) hypertension: Secondary | ICD-10-CM | POA: Diagnosis not present

## 2019-08-01 DIAGNOSIS — N390 Urinary tract infection, site not specified: Secondary | ICD-10-CM | POA: Diagnosis not present

## 2019-08-02 DIAGNOSIS — S40012D Contusion of left shoulder, subsequent encounter: Secondary | ICD-10-CM | POA: Diagnosis not present

## 2019-08-02 DIAGNOSIS — I4819 Other persistent atrial fibrillation: Secondary | ICD-10-CM | POA: Diagnosis not present

## 2019-08-02 DIAGNOSIS — I5033 Acute on chronic diastolic (congestive) heart failure: Secondary | ICD-10-CM | POA: Diagnosis not present

## 2019-08-02 DIAGNOSIS — S8011XD Contusion of right lower leg, subsequent encounter: Secondary | ICD-10-CM | POA: Diagnosis not present

## 2019-08-02 DIAGNOSIS — I2510 Atherosclerotic heart disease of native coronary artery without angina pectoris: Secondary | ICD-10-CM | POA: Diagnosis not present

## 2019-08-02 DIAGNOSIS — I110 Hypertensive heart disease with heart failure: Secondary | ICD-10-CM | POA: Diagnosis not present

## 2019-08-02 LAB — CULTURE, BLOOD (ROUTINE X 2)
Culture: NO GROWTH
Culture: NO GROWTH
Special Requests: ADEQUATE
Special Requests: ADEQUATE

## 2019-08-03 DIAGNOSIS — I2510 Atherosclerotic heart disease of native coronary artery without angina pectoris: Secondary | ICD-10-CM | POA: Diagnosis not present

## 2019-08-03 DIAGNOSIS — S8011XD Contusion of right lower leg, subsequent encounter: Secondary | ICD-10-CM | POA: Diagnosis not present

## 2019-08-03 DIAGNOSIS — S40012D Contusion of left shoulder, subsequent encounter: Secondary | ICD-10-CM | POA: Diagnosis not present

## 2019-08-03 DIAGNOSIS — I5033 Acute on chronic diastolic (congestive) heart failure: Secondary | ICD-10-CM | POA: Diagnosis not present

## 2019-08-03 DIAGNOSIS — I4819 Other persistent atrial fibrillation: Secondary | ICD-10-CM | POA: Diagnosis not present

## 2019-08-03 DIAGNOSIS — I110 Hypertensive heart disease with heart failure: Secondary | ICD-10-CM | POA: Diagnosis not present

## 2019-08-04 DIAGNOSIS — I4819 Other persistent atrial fibrillation: Secondary | ICD-10-CM | POA: Diagnosis not present

## 2019-08-04 DIAGNOSIS — I2510 Atherosclerotic heart disease of native coronary artery without angina pectoris: Secondary | ICD-10-CM | POA: Diagnosis not present

## 2019-08-04 DIAGNOSIS — Z5181 Encounter for therapeutic drug level monitoring: Secondary | ICD-10-CM | POA: Diagnosis not present

## 2019-08-04 DIAGNOSIS — S8011XD Contusion of right lower leg, subsequent encounter: Secondary | ICD-10-CM | POA: Diagnosis not present

## 2019-08-04 DIAGNOSIS — S40012D Contusion of left shoulder, subsequent encounter: Secondary | ICD-10-CM | POA: Diagnosis not present

## 2019-08-04 DIAGNOSIS — I5033 Acute on chronic diastolic (congestive) heart failure: Secondary | ICD-10-CM | POA: Diagnosis not present

## 2019-08-04 DIAGNOSIS — I110 Hypertensive heart disease with heart failure: Secondary | ICD-10-CM | POA: Diagnosis not present

## 2019-08-05 DIAGNOSIS — I4819 Other persistent atrial fibrillation: Secondary | ICD-10-CM | POA: Diagnosis not present

## 2019-08-05 DIAGNOSIS — I5033 Acute on chronic diastolic (congestive) heart failure: Secondary | ICD-10-CM | POA: Diagnosis not present

## 2019-08-05 DIAGNOSIS — I110 Hypertensive heart disease with heart failure: Secondary | ICD-10-CM | POA: Diagnosis not present

## 2019-08-05 DIAGNOSIS — I2510 Atherosclerotic heart disease of native coronary artery without angina pectoris: Secondary | ICD-10-CM | POA: Diagnosis not present

## 2019-08-05 DIAGNOSIS — S40012D Contusion of left shoulder, subsequent encounter: Secondary | ICD-10-CM | POA: Diagnosis not present

## 2019-08-05 DIAGNOSIS — S8011XD Contusion of right lower leg, subsequent encounter: Secondary | ICD-10-CM | POA: Diagnosis not present

## 2019-08-08 DIAGNOSIS — E785 Hyperlipidemia, unspecified: Secondary | ICD-10-CM | POA: Diagnosis not present

## 2019-08-08 DIAGNOSIS — I10 Essential (primary) hypertension: Secondary | ICD-10-CM | POA: Diagnosis not present

## 2019-08-08 DIAGNOSIS — S8011XD Contusion of right lower leg, subsequent encounter: Secondary | ICD-10-CM | POA: Diagnosis not present

## 2019-08-08 DIAGNOSIS — S40012D Contusion of left shoulder, subsequent encounter: Secondary | ICD-10-CM | POA: Diagnosis not present

## 2019-08-08 DIAGNOSIS — I5033 Acute on chronic diastolic (congestive) heart failure: Secondary | ICD-10-CM | POA: Diagnosis not present

## 2019-08-08 DIAGNOSIS — Z1329 Encounter for screening for other suspected endocrine disorder: Secondary | ICD-10-CM | POA: Diagnosis not present

## 2019-08-08 DIAGNOSIS — I2510 Atherosclerotic heart disease of native coronary artery without angina pectoris: Secondary | ICD-10-CM | POA: Diagnosis not present

## 2019-08-08 DIAGNOSIS — I110 Hypertensive heart disease with heart failure: Secondary | ICD-10-CM | POA: Diagnosis not present

## 2019-08-08 DIAGNOSIS — Z1321 Encounter for screening for nutritional disorder: Secondary | ICD-10-CM | POA: Diagnosis not present

## 2019-08-08 DIAGNOSIS — I4819 Other persistent atrial fibrillation: Secondary | ICD-10-CM | POA: Diagnosis not present

## 2019-08-09 DIAGNOSIS — S8011XD Contusion of right lower leg, subsequent encounter: Secondary | ICD-10-CM | POA: Diagnosis not present

## 2019-08-09 DIAGNOSIS — I5033 Acute on chronic diastolic (congestive) heart failure: Secondary | ICD-10-CM | POA: Diagnosis not present

## 2019-08-09 DIAGNOSIS — I4819 Other persistent atrial fibrillation: Secondary | ICD-10-CM | POA: Diagnosis not present

## 2019-08-09 DIAGNOSIS — I110 Hypertensive heart disease with heart failure: Secondary | ICD-10-CM | POA: Diagnosis not present

## 2019-08-09 DIAGNOSIS — S40012D Contusion of left shoulder, subsequent encounter: Secondary | ICD-10-CM | POA: Diagnosis not present

## 2019-08-09 DIAGNOSIS — I2510 Atherosclerotic heart disease of native coronary artery without angina pectoris: Secondary | ICD-10-CM | POA: Diagnosis not present

## 2019-08-10 DIAGNOSIS — I5033 Acute on chronic diastolic (congestive) heart failure: Secondary | ICD-10-CM | POA: Diagnosis not present

## 2019-08-10 DIAGNOSIS — I110 Hypertensive heart disease with heart failure: Secondary | ICD-10-CM | POA: Diagnosis not present

## 2019-08-10 DIAGNOSIS — S40012D Contusion of left shoulder, subsequent encounter: Secondary | ICD-10-CM | POA: Diagnosis not present

## 2019-08-10 DIAGNOSIS — S8011XD Contusion of right lower leg, subsequent encounter: Secondary | ICD-10-CM | POA: Diagnosis not present

## 2019-08-10 DIAGNOSIS — I2510 Atherosclerotic heart disease of native coronary artery without angina pectoris: Secondary | ICD-10-CM | POA: Diagnosis not present

## 2019-08-10 DIAGNOSIS — I4819 Other persistent atrial fibrillation: Secondary | ICD-10-CM | POA: Diagnosis not present

## 2019-08-11 DIAGNOSIS — S8011XD Contusion of right lower leg, subsequent encounter: Secondary | ICD-10-CM | POA: Diagnosis not present

## 2019-08-11 DIAGNOSIS — I110 Hypertensive heart disease with heart failure: Secondary | ICD-10-CM | POA: Diagnosis not present

## 2019-08-11 DIAGNOSIS — I4819 Other persistent atrial fibrillation: Secondary | ICD-10-CM | POA: Diagnosis not present

## 2019-08-11 DIAGNOSIS — I5033 Acute on chronic diastolic (congestive) heart failure: Secondary | ICD-10-CM | POA: Diagnosis not present

## 2019-08-11 DIAGNOSIS — S40012D Contusion of left shoulder, subsequent encounter: Secondary | ICD-10-CM | POA: Diagnosis not present

## 2019-08-11 DIAGNOSIS — I2510 Atherosclerotic heart disease of native coronary artery without angina pectoris: Secondary | ICD-10-CM | POA: Diagnosis not present

## 2019-08-12 DIAGNOSIS — I2510 Atherosclerotic heart disease of native coronary artery without angina pectoris: Secondary | ICD-10-CM | POA: Diagnosis not present

## 2019-08-12 DIAGNOSIS — I5033 Acute on chronic diastolic (congestive) heart failure: Secondary | ICD-10-CM | POA: Diagnosis not present

## 2019-08-12 DIAGNOSIS — S8011XD Contusion of right lower leg, subsequent encounter: Secondary | ICD-10-CM | POA: Diagnosis not present

## 2019-08-12 DIAGNOSIS — I110 Hypertensive heart disease with heart failure: Secondary | ICD-10-CM | POA: Diagnosis not present

## 2019-08-12 DIAGNOSIS — I4819 Other persistent atrial fibrillation: Secondary | ICD-10-CM | POA: Diagnosis not present

## 2019-08-12 DIAGNOSIS — S40012D Contusion of left shoulder, subsequent encounter: Secondary | ICD-10-CM | POA: Diagnosis not present

## 2019-08-15 DIAGNOSIS — I2510 Atherosclerotic heart disease of native coronary artery without angina pectoris: Secondary | ICD-10-CM | POA: Diagnosis not present

## 2019-08-15 DIAGNOSIS — S40012D Contusion of left shoulder, subsequent encounter: Secondary | ICD-10-CM | POA: Diagnosis not present

## 2019-08-15 DIAGNOSIS — S8011XD Contusion of right lower leg, subsequent encounter: Secondary | ICD-10-CM | POA: Diagnosis not present

## 2019-08-15 DIAGNOSIS — I4819 Other persistent atrial fibrillation: Secondary | ICD-10-CM | POA: Diagnosis not present

## 2019-08-15 DIAGNOSIS — I110 Hypertensive heart disease with heart failure: Secondary | ICD-10-CM | POA: Diagnosis not present

## 2019-08-15 DIAGNOSIS — I5033 Acute on chronic diastolic (congestive) heart failure: Secondary | ICD-10-CM | POA: Diagnosis not present

## 2019-08-16 DIAGNOSIS — I482 Chronic atrial fibrillation, unspecified: Secondary | ICD-10-CM | POA: Diagnosis not present

## 2019-08-16 DIAGNOSIS — I10 Essential (primary) hypertension: Secondary | ICD-10-CM | POA: Diagnosis not present

## 2019-08-16 DIAGNOSIS — N400 Enlarged prostate without lower urinary tract symptoms: Secondary | ICD-10-CM | POA: Diagnosis not present

## 2019-08-16 DIAGNOSIS — R531 Weakness: Secondary | ICD-10-CM | POA: Diagnosis not present

## 2019-08-16 DIAGNOSIS — G894 Chronic pain syndrome: Secondary | ICD-10-CM | POA: Diagnosis not present

## 2019-08-16 DIAGNOSIS — N390 Urinary tract infection, site not specified: Secondary | ICD-10-CM | POA: Diagnosis not present

## 2019-08-16 DIAGNOSIS — R269 Unspecified abnormalities of gait and mobility: Secondary | ICD-10-CM | POA: Diagnosis not present

## 2019-08-16 DIAGNOSIS — I2510 Atherosclerotic heart disease of native coronary artery without angina pectoris: Secondary | ICD-10-CM | POA: Diagnosis not present

## 2019-08-16 DIAGNOSIS — R944 Abnormal results of kidney function studies: Secondary | ICD-10-CM | POA: Diagnosis not present

## 2019-08-16 DIAGNOSIS — R197 Diarrhea, unspecified: Secondary | ICD-10-CM | POA: Diagnosis not present

## 2019-08-16 DIAGNOSIS — I509 Heart failure, unspecified: Secondary | ICD-10-CM | POA: Diagnosis not present

## 2019-08-16 DIAGNOSIS — N319 Neuromuscular dysfunction of bladder, unspecified: Secondary | ICD-10-CM | POA: Diagnosis not present

## 2019-08-17 DIAGNOSIS — I110 Hypertensive heart disease with heart failure: Secondary | ICD-10-CM | POA: Diagnosis not present

## 2019-08-17 DIAGNOSIS — I2510 Atherosclerotic heart disease of native coronary artery without angina pectoris: Secondary | ICD-10-CM | POA: Diagnosis not present

## 2019-08-17 DIAGNOSIS — S40012D Contusion of left shoulder, subsequent encounter: Secondary | ICD-10-CM | POA: Diagnosis not present

## 2019-08-17 DIAGNOSIS — S8011XD Contusion of right lower leg, subsequent encounter: Secondary | ICD-10-CM | POA: Diagnosis not present

## 2019-08-17 DIAGNOSIS — I4819 Other persistent atrial fibrillation: Secondary | ICD-10-CM | POA: Diagnosis not present

## 2019-08-17 DIAGNOSIS — I5033 Acute on chronic diastolic (congestive) heart failure: Secondary | ICD-10-CM | POA: Diagnosis not present

## 2019-08-18 DIAGNOSIS — I5033 Acute on chronic diastolic (congestive) heart failure: Secondary | ICD-10-CM | POA: Diagnosis not present

## 2019-08-18 DIAGNOSIS — I110 Hypertensive heart disease with heart failure: Secondary | ICD-10-CM | POA: Diagnosis not present

## 2019-08-18 DIAGNOSIS — S40012D Contusion of left shoulder, subsequent encounter: Secondary | ICD-10-CM | POA: Diagnosis not present

## 2019-08-18 DIAGNOSIS — S8011XD Contusion of right lower leg, subsequent encounter: Secondary | ICD-10-CM | POA: Diagnosis not present

## 2019-08-18 DIAGNOSIS — I4819 Other persistent atrial fibrillation: Secondary | ICD-10-CM | POA: Diagnosis not present

## 2019-08-18 DIAGNOSIS — I2510 Atherosclerotic heart disease of native coronary artery without angina pectoris: Secondary | ICD-10-CM | POA: Diagnosis not present

## 2019-08-19 DIAGNOSIS — R339 Retention of urine, unspecified: Secondary | ICD-10-CM | POA: Diagnosis not present

## 2019-08-19 DIAGNOSIS — N401 Enlarged prostate with lower urinary tract symptoms: Secondary | ICD-10-CM | POA: Diagnosis not present

## 2019-08-19 DIAGNOSIS — G834 Cauda equina syndrome: Secondary | ICD-10-CM | POA: Diagnosis not present

## 2019-08-19 DIAGNOSIS — N312 Flaccid neuropathic bladder, not elsewhere classified: Secondary | ICD-10-CM | POA: Diagnosis not present

## 2019-08-19 DIAGNOSIS — N319 Neuromuscular dysfunction of bladder, unspecified: Secondary | ICD-10-CM | POA: Diagnosis not present

## 2019-08-19 DIAGNOSIS — Z466 Encounter for fitting and adjustment of urinary device: Secondary | ICD-10-CM | POA: Diagnosis not present

## 2019-08-23 ENCOUNTER — Ambulatory Visit (INDEPENDENT_AMBULATORY_CARE_PROVIDER_SITE_OTHER): Payer: Medicare Other | Admitting: Cardiovascular Disease

## 2019-08-23 ENCOUNTER — Encounter: Payer: Self-pay | Admitting: Cardiovascular Disease

## 2019-08-23 VITALS — BP 97/57 | HR 68 | Temp 97.3°F | Ht 72.0 in

## 2019-08-23 DIAGNOSIS — R600 Localized edema: Secondary | ICD-10-CM

## 2019-08-23 DIAGNOSIS — I5033 Acute on chronic diastolic (congestive) heart failure: Secondary | ICD-10-CM | POA: Diagnosis not present

## 2019-08-23 DIAGNOSIS — E785 Hyperlipidemia, unspecified: Secondary | ICD-10-CM | POA: Diagnosis not present

## 2019-08-23 DIAGNOSIS — I5032 Chronic diastolic (congestive) heart failure: Secondary | ICD-10-CM

## 2019-08-23 DIAGNOSIS — I4819 Other persistent atrial fibrillation: Secondary | ICD-10-CM | POA: Diagnosis not present

## 2019-08-23 DIAGNOSIS — I2510 Atherosclerotic heart disease of native coronary artery without angina pectoris: Secondary | ICD-10-CM | POA: Diagnosis not present

## 2019-08-23 DIAGNOSIS — I10 Essential (primary) hypertension: Secondary | ICD-10-CM

## 2019-08-23 DIAGNOSIS — S8011XD Contusion of right lower leg, subsequent encounter: Secondary | ICD-10-CM | POA: Diagnosis not present

## 2019-08-23 DIAGNOSIS — I25708 Atherosclerosis of coronary artery bypass graft(s), unspecified, with other forms of angina pectoris: Secondary | ICD-10-CM | POA: Diagnosis not present

## 2019-08-23 DIAGNOSIS — S40012D Contusion of left shoulder, subsequent encounter: Secondary | ICD-10-CM | POA: Diagnosis not present

## 2019-08-23 DIAGNOSIS — I110 Hypertensive heart disease with heart failure: Secondary | ICD-10-CM | POA: Diagnosis not present

## 2019-08-23 MED ORDER — RIVAROXABAN 20 MG PO TABS: 20.0000 mg | ORAL_TABLET | Freq: Every day | ORAL | 11 refills | Status: DC

## 2019-08-23 NOTE — Progress Notes (Signed)
SUBJECTIVE: The patient presents routine follow-up.  He was evaluated in the ED for hypotension on 07/28/2019.  However, manual blood pressures were in the 100 mmHg systolic range.  Hemoglobin was found to be low at 8.8 and he was given referral to GI.  He has a history of coronary artery disease with CABG in 2000 with low risk nuclear stress test in July 2018, persistent atrial fibrillation (diagnosed in May 2020), and chronic diastolic heart failure.  He is here with his son-in-law.  He denies chest pain, palpitations, shortness of breath.  He has had left leg swelling for the past 6 weeks ever since he had a fall.  He has not taken Xarelto for the past 3 or 4 days because it caused itching.  He stopped taking apixaban because he thought it led to diarrhea although this persisted after cessation of apixaban.  He has been taking Lasix for the past 3 or 4 days.  He has a Foley catheter.  He is receiving physical therapy at home.  He told me his wife takes warfarin.    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  Review of Systems: As per "subjective", otherwise negative.  Allergies  Allergen Reactions  . Morphine Other (See Comments)    REACTION: made him go crazy; "I had fun w/it; my family didn't think it was too funny"  . Metoprolol Other (See Comments)    REACTION:  Tachycardia; "don't remember how bad it was; it was so long ago"  . Rifampin     Itch     Current Outpatient Medications  Medication Sig Dispense Refill  . acetaminophen (TYLENOL) 325 MG tablet Take 1-2 tablets (325-650 mg total) by mouth every 4 (four) hours as needed for mild pain.    Marland Kitchen atorvastatin (LIPITOR) 40 MG tablet TAKE 1 TABLET BY MOUTH EVERY DAY 30 tablet 6  . docusate sodium (COLACE) 100 MG capsule Take 100 mg by mouth daily.    Marland Kitchen doxazosin (CARDURA) 8 MG tablet Take 8 mg by mouth daily.    . finasteride  (PROSCAR) 5 MG tablet Take 5 mg by mouth daily.    . furosemide (LASIX) 20 MG tablet Take 1 tablet (20 mg total) by mouth daily. 90 tablet 3  . HYDROcodone-acetaminophen (NORCO/VICODIN) 5-325 MG tablet Take 1-2 tablets by mouth every 6 (six) hours as needed. 6 tablet 0  . Multiple Vitamin (MULTIVITAMIN) tablet Take 1 tablet by mouth daily.    . nitroGLYCERIN (NITROSTAT) 0.4 MG SL tablet Place 0.4 mg under the tongue every 5 (five) minutes as needed for chest pain. May take up to 3 doses per episode. Contact MD or 911 if unresolved chest pain continues.    . predniSONE (DELTASONE) 10 MG tablet Take 5-10 mg by mouth as directed.  1  . rivaroxaban (XARELTO) 20 MG TABS tablet Take 20 mg by mouth daily with supper.    . tamsulosin (FLOMAX) 0.4 MG CAPS capsule Take 0.4 mg by mouth.    . traMADol (ULTRAM) 50 MG tablet Take 50 mg by mouth every 6 (six) hours as needed.    . ALPRAZolam (XANAX) 0.5 MG tablet Take 0.25-0.5 mg by mouth 2 (two) times daily as needed for anxiety.     No current facility-administered medications for this visit.    Past Medical History:  Diagnosis Date  . Acute kidney failure, unspecified (HCC)   . Acute renal failure (HCC)  04/17/2013  . Anemia, iron deficiency 09/09/2013  . Anxiety   . Anxiety disorder, unspecified   . Arthritis   . Atherosclerotic heart disease of native coronary artery without angina pectoris   . Atrial fibrillation ([place])   . 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 Cauda equina syndrome ([place])   . Chronic diastolic (congestive) heart failure ([place])   . Chronic kidney disease, stage 1   . Coronary artery disease    a. s/p CABG in 2000 with low-risk NST in 01/2017  . Diarrhea, unspecified   . Disorder of blood    BEEN TREATED BY DERMATOLOGIST X 4 YRS..."BLOOD BLISTERS"  . Epistaxis   . Essential (primary) hypertension   . Gastro-esophageal reflux disease without esophagitis   . Gout   . Gout, unspecified   . HTN (hypertension)     sees Dr. [name] in [place]  . Hx MRSA infection    rt shoulder  . Hyperglycemia, unspecified   . Hyperlipidemia   . Hyperlipidemia, unspecified   . Inflammatory disease of prostate, unspecified   . Iron deficiency anemia, unspecified   . Localized edema   . Low back pain   . Osteonecrosis, unspecified ([place])   . Other cervical disc degeneration, unspecified cervical region   . Personal history of Methicillin resistant Staphylococcus aureus infection   . Pneumonia    "I've had it 3-4 times"  . Pneumonia, unspecified organism   . Retention of urine, unspecified   . Secondary osteoarthritis, unspecified site   . Small bowel obstruction ([place])   . Small bowel problem    HAD ALOT OF SCAR TISSUE FROM PREVIOUS SURGERIES..NG WAS INSERTED ...Marland KitchenMarland KitchenNO SURGERY NEEDED...Marland KitchenMarland KitchenIN FOR 8 DAYS  . Unspecified abnormalities of gait and mobility   . Unspecified atrial fibrillation ([place])   . Ventral hernia   . Ventral hernia without obstruction or gangrene     Past Surgical History:  Procedure Laterality Date  . ANTERIOR CERVICAL CORPECTOMY  12/17/11  . ANTERIOR CERVICAL CORPECTOMY  12/17/2011   Procedure: ANTERIOR CERVICAL CORPECTOMY;  Surgeon: Floyce Stakes, MD;  Location: [facility];  Service: Neurosurgery;  Laterality: N/A;  Anterior Cervical Decompression Fusion Five to Thoracic Two with plating  . BACK SURGERY     lumbar  . CATARACT EXTRACTION W/ INTRAOCULAR LENS  IMPLANT, BILATERAL  ? 2011  . CHOLECYSTECTOMY  2006 "or after"  . CORONARY ANGIOPLASTY WITH STENT PLACEMENT  2012  . CORONARY ARTERY BYPASS GRAFT  2000   CABG X5  . EYE SURGERY     bilateral cataract  . INCISION AND DRAINAGE OF WOUND  ~ 20ll; 12/03/11   "had infection in my right"  . LUMBAR LAMINECTOMY/DECOMPRESSION MICRODISCECTOMY  06/13/2011   Procedure: LUMBAR LAMINECTOMY/DECOMPRESSION MICRODISCECTOMY;  Surgeon: Floyce Stakes;  Location: [facility];  Service: Neurosurgery;  Laterality: N/A;  Lumbar three, lumbar  four-five Laminectomy  . LUMBAR LAMINECTOMY/DECOMPRESSION MICRODISCECTOMY Right 06/17/2013   Procedure: LUMBAR LAMINECTOMY/DECOMPRESSION MICRODISCECTOMY 1 LEVEL;  Surgeon: Floyce Stakes, MD;  Location: [facility];  Service: Neurosurgery;  Laterality: Right;  Right L3-4 Microdiskectomy  . LUMBAR WOUND DEBRIDEMENT N/A 08/02/2013   Procedure: INCISION AND DRAINAGE OF LUMBAR WOUND DEBRIDEMENT;  Surgeon: Floyce Stakes, MD;  Location: [facility];  Service: Neurosurgery;  Laterality: N/A;  . [place] CATHETER INSERTION  2011 & 11/2011  . SHOULDER ARTHROSCOPY Left 09/13/2013   Procedure: ARTHROSCOPIC IRRIGATION AND DEBRIDEMENT, SYNOVECTOMY ,  LEFT SHOULDER ;  Surgeon: Viona Gilmore  D Carloyn Manner., MD;  Location: [facility] OR;  Service: Orthopedics;  Laterality: Left;  . SHOULDER OPEN ROTATOR CUFF REPAIR  ~ 2011   right  . STERNAL SURG  2000   HAS HAD 5-6 ON HIS STERNUM; "caught MRSA in it"  . TONSILLECTOMY  1949    Social History   Socioeconomic History  . Marital status: Married    Spouse name: Not on file  . Number of children: Not on file  . Years of education: Not on file  . Highest education level: Not on file  Occupational History  . Not on file  Tobacco Use  . Smoking status: Former Smoker    Packs/day: 1.00    Years: 1.00    Pack years: 1.00    Types: Cigarettes    Start date: 12/29/1959    Quit date: 12/27/1998    Years since quitting: 20.6  . Smokeless tobacco: Never Used  Substance and Sexual Activity  . Alcohol use: No    Alcohol/week: 0.0 standard drinks  . Drug use: No  . Sexual activity: Not Currently  Other Topics Concern  . Not on file  Social History Narrative  . Not on file   Social Determinants of Health   Financial Resource Strain:   . Difficulty of Paying Living Expenses: Not on file  Food Insecurity:   . Worried About Programme researcher, broadcasting/film/video in the Last Year: Not on file  . Ran Out of Food in the Last Year: Not on file  Transportation Needs:   . Lack  of Transportation (Medical): Not on file  . Lack of Transportation (Non-Medical): Not on file  Physical Activity:   . Days of Exercise per Week: Not on file  . Minutes of Exercise per Session: Not on file  Stress:   . Feeling of Stress : Not on file  Social Connections:   . Frequency of Communication with Friends and Family: Not on file  . Frequency of Social Gatherings with Friends and Family: Not on file  . Attends Religious Services: Not on file  . Active Member of Clubs or Organizations: Not on file  . Attends Banker Meetings: Not on file  . Marital Status: Not on file  Intimate Partner Violence:   . Fear of Current or Ex-Partner: Not on file  . Emotionally Abused: Not on file  . Physically Abused: Not on file  . Sexually Abused: Not on file     Vitals:   08/23/19 0812  BP: (!) 97/57  Pulse: 68  Temp: (!) 97.3 F (36.3 C)  Height: 6' (1.829 m)    Wt Readings from Last 3 Encounters:  07/28/19 160 lb (72.6 kg)  06/27/19 155 lb (70.3 kg)  06/20/19 155 lb (70.3 kg)     PHYSICAL EXAM General: NAD, elderly, frail HEENT: Normal. Neck: No JVD, no thyromegaly. Lungs: Clear to auscultation bilaterally with normal respiratory effort. CV: Regular rate and irregular rhythm, normal S1/S2, no S3, no murmur.  1+ pitting left lower extremity edema.  Abdomen: Soft, nontender, no distention.  Neurologic: Alert and oriented.  Psych: Normal affect. Skin: Normal. Musculoskeletal: No gross deformities.      Labs: Lab Results  Component Value Date/Time   K 4.5 07/28/2019 11:47 AM   BUN 34 (H) 07/28/2019 11:47 AM   CREATININE 1.93 (H) 07/28/2019 11:47 AM   ALT 10 07/28/2019 11:47 AM   TSH 2.362 09/07/2013 03:47 PM   HGB 8.8 (L) 07/28/2019 11:07 AM  Lipids: Lab Results  Component Value Date/Time   LDLCALC 69 02/26/2018 07:43 AM   CHOL 143 02/26/2018 07:43 AM   TRIG 152 (H) 02/26/2018 07:43 AM   HDL 51 02/26/2018 07:43 AM       Additional studies/  records that were reviewed today include:   Echocardiogram: 12/27/2018 IMPRESSIONS  1. The left ventricle has normal systolic function, with an ejection fraction of 55-60%. The cavity size was normal. There is mild concentric left ventricular hypertrophy. Left ventricular diastolic function could not be evaluated secondary to atrial  fibrillation. 2. The right ventricle has mildly reduced systolic function. The cavity was mildly enlarged. There is no increase in right ventricular wall thickness. 3. Left atrial size was severely dilated. 4. The mitral valve is grossly normal. Mild thickening of the mitral valve leaflet. There is mild mitral annular calcification present. 5. The tricuspid valve is grossly normal. 6. The aortic valve is tricuspid. Mild thickening of the aortic valve. 7. Moderate aortic annular calcification. 8. The aortic root is normal in size and structure. 9. The inferior vena cava was dilated in size with >50% respiratory variability. 10. The interatrial septum was not well visualized.  NST: 01/2017  Clinically negative for ischemia Normal perfusion  The study is normal.  This is a low risk study.  Nuclear stress EF: 61%.    ASSESSMENT AND PLAN: 1.  Coronary artery disease: History of CABG in 2000.  Symptomatically stable.  On atorvastatin.  No aspirin as he is on Xarelto.  Low risk nuclear stress test in July 2018.  2.  Chronic diastolic heart failure: On Lasix 20 mg daily.  He has a Foley catheter.  3.  Hypertension: Blood pressure is low normal.  He is not on antihypertensive therapy.  He takes Lasix 20 mg daily.  4.  Hyperlipidemia: On atorvastatin 40 mg daily.  5.  Persistent atrial fibrillation: Anticoagulated with Xarelto 20 mg daily.  He has not taken it for 3 or 4 days due to itching the now asked for it to be refilled as he would like to remain on this, although other anticoagulation options were discussed including warfarin.  Heart rate is  controlled without AV nodal blocking agents.    Disposition: Follow up 6 months.   Prentice Docker, M.D., F.A.C.C.

## 2019-08-23 NOTE — Patient Instructions (Signed)
Medication Instructions:  Your physician recommends that you continue on your current medications as directed. Please refer to the Current Medication list given to you today.  *If you need a refill on your cardiac medications before your next appointment, please call your pharmacy*  Lab Work: None If you have labs (blood work) drawn today and your tests are completely normal, you will receive your results only by: Marland Kitchen MyChart Message (if you have MyChart) OR . A paper copy in the mail If you have any lab test that is abnormal or we need to change your treatment, we will call you to review the results.  Testing/Procedures: None  Follow-Up: At [facility], you and your health needs are our priority.  As part of our continuing mission to provide you with exceptional heart care, we have created designated Provider Care Teams.  These Care Teams include your primary Cardiologist (physician) and Advanced Practice Providers (APPs -  Physician Assistants and Nurse Practitioners) who all work together to provide you with the care you need, when you need it.  Your next appointment:   6 month(s)  The format for your next appointment:   In Person  Provider:   Jacolyn Reedy, PA-C  Other Instructions None       Thank you for choosing [facility] !

## 2019-08-25 DIAGNOSIS — I2510 Atherosclerotic heart disease of native coronary artery without angina pectoris: Secondary | ICD-10-CM | POA: Diagnosis not present

## 2019-08-25 DIAGNOSIS — I5033 Acute on chronic diastolic (congestive) heart failure: Secondary | ICD-10-CM | POA: Diagnosis not present

## 2019-08-25 DIAGNOSIS — Z79899 Other long term (current) drug therapy: Secondary | ICD-10-CM | POA: Diagnosis not present

## 2019-08-25 DIAGNOSIS — D509 Iron deficiency anemia, unspecified: Secondary | ICD-10-CM | POA: Diagnosis not present

## 2019-08-25 DIAGNOSIS — S40012D Contusion of left shoulder, subsequent encounter: Secondary | ICD-10-CM | POA: Diagnosis not present

## 2019-08-25 DIAGNOSIS — M6281 Muscle weakness (generalized): Secondary | ICD-10-CM | POA: Diagnosis not present

## 2019-08-25 DIAGNOSIS — Z951 Presence of aortocoronary bypass graft: Secondary | ICD-10-CM | POA: Diagnosis not present

## 2019-08-25 DIAGNOSIS — F419 Anxiety disorder, unspecified: Secondary | ICD-10-CM | POA: Diagnosis not present

## 2019-08-25 DIAGNOSIS — Z7901 Long term (current) use of anticoagulants: Secondary | ICD-10-CM | POA: Diagnosis not present

## 2019-08-25 DIAGNOSIS — I110 Hypertensive heart disease with heart failure: Secondary | ICD-10-CM | POA: Diagnosis not present

## 2019-08-25 DIAGNOSIS — Z8739 Personal history of other diseases of the musculoskeletal system and connective tissue: Secondary | ICD-10-CM | POA: Diagnosis not present

## 2019-08-25 DIAGNOSIS — M109 Gout, unspecified: Secondary | ICD-10-CM | POA: Diagnosis not present

## 2019-08-25 DIAGNOSIS — E785 Hyperlipidemia, unspecified: Secondary | ICD-10-CM | POA: Diagnosis not present

## 2019-08-25 DIAGNOSIS — K219 Gastro-esophageal reflux disease without esophagitis: Secondary | ICD-10-CM | POA: Diagnosis not present

## 2019-08-25 DIAGNOSIS — Z8701 Personal history of pneumonia (recurrent): Secondary | ICD-10-CM | POA: Diagnosis not present

## 2019-08-25 DIAGNOSIS — M19012 Primary osteoarthritis, left shoulder: Secondary | ICD-10-CM | POA: Diagnosis not present

## 2019-08-25 DIAGNOSIS — Z955 Presence of coronary angioplasty implant and graft: Secondary | ICD-10-CM | POA: Diagnosis not present

## 2019-08-25 DIAGNOSIS — Z87891 Personal history of nicotine dependence: Secondary | ICD-10-CM | POA: Diagnosis not present

## 2019-08-25 DIAGNOSIS — W19XXXD Unspecified fall, subsequent encounter: Secondary | ICD-10-CM | POA: Diagnosis not present

## 2019-08-25 DIAGNOSIS — S8011XD Contusion of right lower leg, subsequent encounter: Secondary | ICD-10-CM | POA: Diagnosis not present

## 2019-08-25 DIAGNOSIS — Z8614 Personal history of Methicillin resistant Staphylococcus aureus infection: Secondary | ICD-10-CM | POA: Diagnosis not present

## 2019-08-25 DIAGNOSIS — Z862 Personal history of diseases of the blood and blood-forming organs and certain disorders involving the immune mechanism: Secondary | ICD-10-CM | POA: Diagnosis not present

## 2019-08-25 DIAGNOSIS — Z9181 History of falling: Secondary | ICD-10-CM | POA: Diagnosis not present

## 2019-08-25 DIAGNOSIS — Z7952 Long term (current) use of systemic steroids: Secondary | ICD-10-CM | POA: Diagnosis not present

## 2019-08-25 DIAGNOSIS — I4819 Other persistent atrial fibrillation: Secondary | ICD-10-CM | POA: Diagnosis not present

## 2019-08-29 DIAGNOSIS — I4819 Other persistent atrial fibrillation: Secondary | ICD-10-CM | POA: Diagnosis not present

## 2019-08-29 DIAGNOSIS — I110 Hypertensive heart disease with heart failure: Secondary | ICD-10-CM | POA: Diagnosis not present

## 2019-08-29 DIAGNOSIS — I5033 Acute on chronic diastolic (congestive) heart failure: Secondary | ICD-10-CM | POA: Diagnosis not present

## 2019-08-29 DIAGNOSIS — S8011XD Contusion of right lower leg, subsequent encounter: Secondary | ICD-10-CM | POA: Diagnosis not present

## 2019-08-29 DIAGNOSIS — I2510 Atherosclerotic heart disease of native coronary artery without angina pectoris: Secondary | ICD-10-CM | POA: Diagnosis not present

## 2019-08-29 DIAGNOSIS — S40012D Contusion of left shoulder, subsequent encounter: Secondary | ICD-10-CM | POA: Diagnosis not present

## 2019-08-31 DIAGNOSIS — S40012D Contusion of left shoulder, subsequent encounter: Secondary | ICD-10-CM | POA: Diagnosis not present

## 2019-08-31 DIAGNOSIS — I4819 Other persistent atrial fibrillation: Secondary | ICD-10-CM | POA: Diagnosis not present

## 2019-08-31 DIAGNOSIS — I5033 Acute on chronic diastolic (congestive) heart failure: Secondary | ICD-10-CM | POA: Diagnosis not present

## 2019-08-31 DIAGNOSIS — I110 Hypertensive heart disease with heart failure: Secondary | ICD-10-CM | POA: Diagnosis not present

## 2019-08-31 DIAGNOSIS — I2510 Atherosclerotic heart disease of native coronary artery without angina pectoris: Secondary | ICD-10-CM | POA: Diagnosis not present

## 2019-08-31 DIAGNOSIS — S8011XD Contusion of right lower leg, subsequent encounter: Secondary | ICD-10-CM | POA: Diagnosis not present

## 2019-09-01 DIAGNOSIS — I2510 Atherosclerotic heart disease of native coronary artery without angina pectoris: Secondary | ICD-10-CM | POA: Diagnosis not present

## 2019-09-01 DIAGNOSIS — S40012D Contusion of left shoulder, subsequent encounter: Secondary | ICD-10-CM | POA: Diagnosis not present

## 2019-09-01 DIAGNOSIS — S8011XD Contusion of right lower leg, subsequent encounter: Secondary | ICD-10-CM | POA: Diagnosis not present

## 2019-09-01 DIAGNOSIS — I5033 Acute on chronic diastolic (congestive) heart failure: Secondary | ICD-10-CM | POA: Diagnosis not present

## 2019-09-01 DIAGNOSIS — I4819 Other persistent atrial fibrillation: Secondary | ICD-10-CM | POA: Diagnosis not present

## 2019-09-01 DIAGNOSIS — I110 Hypertensive heart disease with heart failure: Secondary | ICD-10-CM | POA: Diagnosis not present

## 2019-09-02 ENCOUNTER — Telehealth: Payer: Self-pay | Admitting: Orthopedic Surgery

## 2019-09-02 DIAGNOSIS — I4819 Other persistent atrial fibrillation: Secondary | ICD-10-CM | POA: Diagnosis not present

## 2019-09-02 DIAGNOSIS — I2510 Atherosclerotic heart disease of native coronary artery without angina pectoris: Secondary | ICD-10-CM | POA: Diagnosis not present

## 2019-09-02 DIAGNOSIS — I110 Hypertensive heart disease with heart failure: Secondary | ICD-10-CM | POA: Diagnosis not present

## 2019-09-02 DIAGNOSIS — S8011XD Contusion of right lower leg, subsequent encounter: Secondary | ICD-10-CM | POA: Diagnosis not present

## 2019-09-02 DIAGNOSIS — S40012D Contusion of left shoulder, subsequent encounter: Secondary | ICD-10-CM | POA: Diagnosis not present

## 2019-09-02 DIAGNOSIS — I5033 Acute on chronic diastolic (congestive) heart failure: Secondary | ICD-10-CM | POA: Diagnosis not present

## 2019-09-02 NOTE — Telephone Encounter (Signed)
Patient returned call - per voice message received this morning. said a lady called him this morning.

## 2019-09-02 NOTE — Telephone Encounter (Signed)
Sorry, I did not call him, I would have made a phone note

## 2019-09-02 NOTE — Telephone Encounter (Signed)
I called back to patient to relay. Said it wouldn't have been for him, error. Said sorry.

## 2019-09-06 DIAGNOSIS — I2510 Atherosclerotic heart disease of native coronary artery without angina pectoris: Secondary | ICD-10-CM | POA: Diagnosis not present

## 2019-09-06 DIAGNOSIS — I5033 Acute on chronic diastolic (congestive) heart failure: Secondary | ICD-10-CM | POA: Diagnosis not present

## 2019-09-06 DIAGNOSIS — I4819 Other persistent atrial fibrillation: Secondary | ICD-10-CM | POA: Diagnosis not present

## 2019-09-06 DIAGNOSIS — I110 Hypertensive heart disease with heart failure: Secondary | ICD-10-CM | POA: Diagnosis not present

## 2019-09-06 DIAGNOSIS — S40012D Contusion of left shoulder, subsequent encounter: Secondary | ICD-10-CM | POA: Diagnosis not present

## 2019-09-06 DIAGNOSIS — S8011XD Contusion of right lower leg, subsequent encounter: Secondary | ICD-10-CM | POA: Diagnosis not present

## 2019-09-08 DIAGNOSIS — S8011XD Contusion of right lower leg, subsequent encounter: Secondary | ICD-10-CM | POA: Diagnosis not present

## 2019-09-08 DIAGNOSIS — I2510 Atherosclerotic heart disease of native coronary artery without angina pectoris: Secondary | ICD-10-CM | POA: Diagnosis not present

## 2019-09-08 DIAGNOSIS — I5033 Acute on chronic diastolic (congestive) heart failure: Secondary | ICD-10-CM | POA: Diagnosis not present

## 2019-09-08 DIAGNOSIS — I110 Hypertensive heart disease with heart failure: Secondary | ICD-10-CM | POA: Diagnosis not present

## 2019-09-08 DIAGNOSIS — S40012D Contusion of left shoulder, subsequent encounter: Secondary | ICD-10-CM | POA: Diagnosis not present

## 2019-09-08 DIAGNOSIS — I4819 Other persistent atrial fibrillation: Secondary | ICD-10-CM | POA: Diagnosis not present

## 2019-09-09 DIAGNOSIS — S40012D Contusion of left shoulder, subsequent encounter: Secondary | ICD-10-CM | POA: Diagnosis not present

## 2019-09-09 DIAGNOSIS — I2510 Atherosclerotic heart disease of native coronary artery without angina pectoris: Secondary | ICD-10-CM | POA: Diagnosis not present

## 2019-09-09 DIAGNOSIS — I4819 Other persistent atrial fibrillation: Secondary | ICD-10-CM | POA: Diagnosis not present

## 2019-09-09 DIAGNOSIS — I110 Hypertensive heart disease with heart failure: Secondary | ICD-10-CM | POA: Diagnosis not present

## 2019-09-09 DIAGNOSIS — S8011XD Contusion of right lower leg, subsequent encounter: Secondary | ICD-10-CM | POA: Diagnosis not present

## 2019-09-09 DIAGNOSIS — I5033 Acute on chronic diastolic (congestive) heart failure: Secondary | ICD-10-CM | POA: Diagnosis not present

## 2019-09-12 DIAGNOSIS — I4819 Other persistent atrial fibrillation: Secondary | ICD-10-CM | POA: Diagnosis not present

## 2019-09-12 DIAGNOSIS — I5033 Acute on chronic diastolic (congestive) heart failure: Secondary | ICD-10-CM | POA: Diagnosis not present

## 2019-09-12 DIAGNOSIS — S8011XD Contusion of right lower leg, subsequent encounter: Secondary | ICD-10-CM | POA: Diagnosis not present

## 2019-09-12 DIAGNOSIS — I2510 Atherosclerotic heart disease of native coronary artery without angina pectoris: Secondary | ICD-10-CM | POA: Diagnosis not present

## 2019-09-12 DIAGNOSIS — S40012D Contusion of left shoulder, subsequent encounter: Secondary | ICD-10-CM | POA: Diagnosis not present

## 2019-09-12 DIAGNOSIS — I110 Hypertensive heart disease with heart failure: Secondary | ICD-10-CM | POA: Diagnosis not present

## 2019-09-13 DIAGNOSIS — S8011XD Contusion of right lower leg, subsequent encounter: Secondary | ICD-10-CM | POA: Diagnosis not present

## 2019-09-13 DIAGNOSIS — I110 Hypertensive heart disease with heart failure: Secondary | ICD-10-CM | POA: Diagnosis not present

## 2019-09-13 DIAGNOSIS — S40012D Contusion of left shoulder, subsequent encounter: Secondary | ICD-10-CM | POA: Diagnosis not present

## 2019-09-13 DIAGNOSIS — I5033 Acute on chronic diastolic (congestive) heart failure: Secondary | ICD-10-CM | POA: Diagnosis not present

## 2019-09-13 DIAGNOSIS — I2510 Atherosclerotic heart disease of native coronary artery without angina pectoris: Secondary | ICD-10-CM | POA: Diagnosis not present

## 2019-09-13 DIAGNOSIS — I4819 Other persistent atrial fibrillation: Secondary | ICD-10-CM | POA: Diagnosis not present

## 2019-09-14 DIAGNOSIS — I482 Chronic atrial fibrillation, unspecified: Secondary | ICD-10-CM | POA: Diagnosis not present

## 2019-09-14 DIAGNOSIS — R944 Abnormal results of kidney function studies: Secondary | ICD-10-CM | POA: Diagnosis not present

## 2019-09-14 DIAGNOSIS — R531 Weakness: Secondary | ICD-10-CM | POA: Diagnosis not present

## 2019-09-14 DIAGNOSIS — I110 Hypertensive heart disease with heart failure: Secondary | ICD-10-CM | POA: Diagnosis not present

## 2019-09-14 DIAGNOSIS — E782 Mixed hyperlipidemia: Secondary | ICD-10-CM | POA: Diagnosis not present

## 2019-09-14 DIAGNOSIS — R269 Unspecified abnormalities of gait and mobility: Secondary | ICD-10-CM | POA: Diagnosis not present

## 2019-09-14 DIAGNOSIS — G894 Chronic pain syndrome: Secondary | ICD-10-CM | POA: Diagnosis not present

## 2019-09-14 DIAGNOSIS — S40012D Contusion of left shoulder, subsequent encounter: Secondary | ICD-10-CM | POA: Diagnosis not present

## 2019-09-14 DIAGNOSIS — I5033 Acute on chronic diastolic (congestive) heart failure: Secondary | ICD-10-CM | POA: Diagnosis not present

## 2019-09-14 DIAGNOSIS — I2510 Atherosclerotic heart disease of native coronary artery without angina pectoris: Secondary | ICD-10-CM | POA: Diagnosis not present

## 2019-09-14 DIAGNOSIS — I10 Essential (primary) hypertension: Secondary | ICD-10-CM | POA: Diagnosis not present

## 2019-09-14 DIAGNOSIS — S8011XD Contusion of right lower leg, subsequent encounter: Secondary | ICD-10-CM | POA: Diagnosis not present

## 2019-09-14 DIAGNOSIS — I4819 Other persistent atrial fibrillation: Secondary | ICD-10-CM | POA: Diagnosis not present

## 2019-09-14 DIAGNOSIS — R197 Diarrhea, unspecified: Secondary | ICD-10-CM | POA: Diagnosis not present

## 2019-09-14 DIAGNOSIS — N390 Urinary tract infection, site not specified: Secondary | ICD-10-CM | POA: Diagnosis not present

## 2019-09-14 DIAGNOSIS — N400 Enlarged prostate without lower urinary tract symptoms: Secondary | ICD-10-CM | POA: Diagnosis not present

## 2019-09-14 DIAGNOSIS — I509 Heart failure, unspecified: Secondary | ICD-10-CM | POA: Diagnosis not present

## 2019-09-14 DIAGNOSIS — N319 Neuromuscular dysfunction of bladder, unspecified: Secondary | ICD-10-CM | POA: Diagnosis not present

## 2019-09-14 DIAGNOSIS — E785 Hyperlipidemia, unspecified: Secondary | ICD-10-CM | POA: Diagnosis not present

## 2019-09-16 DIAGNOSIS — S40012D Contusion of left shoulder, subsequent encounter: Secondary | ICD-10-CM | POA: Diagnosis not present

## 2019-09-16 DIAGNOSIS — S8011XD Contusion of right lower leg, subsequent encounter: Secondary | ICD-10-CM | POA: Diagnosis not present

## 2019-09-16 DIAGNOSIS — I2510 Atherosclerotic heart disease of native coronary artery without angina pectoris: Secondary | ICD-10-CM | POA: Diagnosis not present

## 2019-09-16 DIAGNOSIS — I4819 Other persistent atrial fibrillation: Secondary | ICD-10-CM | POA: Diagnosis not present

## 2019-09-16 DIAGNOSIS — I110 Hypertensive heart disease with heart failure: Secondary | ICD-10-CM | POA: Diagnosis not present

## 2019-09-16 DIAGNOSIS — I5033 Acute on chronic diastolic (congestive) heart failure: Secondary | ICD-10-CM | POA: Diagnosis not present

## 2019-09-19 DIAGNOSIS — I2510 Atherosclerotic heart disease of native coronary artery without angina pectoris: Secondary | ICD-10-CM | POA: Diagnosis not present

## 2019-09-19 DIAGNOSIS — S40012D Contusion of left shoulder, subsequent encounter: Secondary | ICD-10-CM | POA: Diagnosis not present

## 2019-09-19 DIAGNOSIS — I4819 Other persistent atrial fibrillation: Secondary | ICD-10-CM | POA: Diagnosis not present

## 2019-09-19 DIAGNOSIS — I5033 Acute on chronic diastolic (congestive) heart failure: Secondary | ICD-10-CM | POA: Diagnosis not present

## 2019-09-19 DIAGNOSIS — I110 Hypertensive heart disease with heart failure: Secondary | ICD-10-CM | POA: Diagnosis not present

## 2019-09-19 DIAGNOSIS — S8011XD Contusion of right lower leg, subsequent encounter: Secondary | ICD-10-CM | POA: Diagnosis not present

## 2019-09-20 DIAGNOSIS — S8011XD Contusion of right lower leg, subsequent encounter: Secondary | ICD-10-CM | POA: Diagnosis not present

## 2019-09-20 DIAGNOSIS — I5033 Acute on chronic diastolic (congestive) heart failure: Secondary | ICD-10-CM | POA: Diagnosis not present

## 2019-09-20 DIAGNOSIS — I110 Hypertensive heart disease with heart failure: Secondary | ICD-10-CM | POA: Diagnosis not present

## 2019-09-20 DIAGNOSIS — I2510 Atherosclerotic heart disease of native coronary artery without angina pectoris: Secondary | ICD-10-CM | POA: Diagnosis not present

## 2019-09-20 DIAGNOSIS — S40012D Contusion of left shoulder, subsequent encounter: Secondary | ICD-10-CM | POA: Diagnosis not present

## 2019-09-20 DIAGNOSIS — I4819 Other persistent atrial fibrillation: Secondary | ICD-10-CM | POA: Diagnosis not present

## 2019-09-21 DIAGNOSIS — S8011XD Contusion of right lower leg, subsequent encounter: Secondary | ICD-10-CM | POA: Diagnosis not present

## 2019-09-21 DIAGNOSIS — I2510 Atherosclerotic heart disease of native coronary artery without angina pectoris: Secondary | ICD-10-CM | POA: Diagnosis not present

## 2019-09-21 DIAGNOSIS — I110 Hypertensive heart disease with heart failure: Secondary | ICD-10-CM | POA: Diagnosis not present

## 2019-09-21 DIAGNOSIS — I4819 Other persistent atrial fibrillation: Secondary | ICD-10-CM | POA: Diagnosis not present

## 2019-09-21 DIAGNOSIS — S40012D Contusion of left shoulder, subsequent encounter: Secondary | ICD-10-CM | POA: Diagnosis not present

## 2019-09-21 DIAGNOSIS — I5033 Acute on chronic diastolic (congestive) heart failure: Secondary | ICD-10-CM | POA: Diagnosis not present

## 2019-09-22 DIAGNOSIS — N401 Enlarged prostate with lower urinary tract symptoms: Secondary | ICD-10-CM | POA: Diagnosis not present

## 2019-09-22 DIAGNOSIS — S8011XD Contusion of right lower leg, subsequent encounter: Secondary | ICD-10-CM | POA: Diagnosis not present

## 2019-09-22 DIAGNOSIS — S40012D Contusion of left shoulder, subsequent encounter: Secondary | ICD-10-CM | POA: Diagnosis not present

## 2019-09-22 DIAGNOSIS — I110 Hypertensive heart disease with heart failure: Secondary | ICD-10-CM | POA: Diagnosis not present

## 2019-09-22 DIAGNOSIS — I5033 Acute on chronic diastolic (congestive) heart failure: Secondary | ICD-10-CM | POA: Diagnosis not present

## 2019-09-22 DIAGNOSIS — I2510 Atherosclerotic heart disease of native coronary artery without angina pectoris: Secondary | ICD-10-CM | POA: Diagnosis not present

## 2019-09-22 DIAGNOSIS — N138 Other obstructive and reflux uropathy: Secondary | ICD-10-CM | POA: Diagnosis not present

## 2019-09-22 DIAGNOSIS — I4819 Other persistent atrial fibrillation: Secondary | ICD-10-CM | POA: Diagnosis not present

## 2019-09-22 DIAGNOSIS — Z466 Encounter for fitting and adjustment of urinary device: Secondary | ICD-10-CM | POA: Diagnosis not present

## 2019-09-22 DIAGNOSIS — G834 Cauda equina syndrome: Secondary | ICD-10-CM | POA: Diagnosis not present

## 2019-09-22 DIAGNOSIS — N319 Neuromuscular dysfunction of bladder, unspecified: Secondary | ICD-10-CM | POA: Diagnosis not present

## 2019-09-24 DIAGNOSIS — F419 Anxiety disorder, unspecified: Secondary | ICD-10-CM | POA: Diagnosis not present

## 2019-09-24 DIAGNOSIS — R944 Abnormal results of kidney function studies: Secondary | ICD-10-CM | POA: Diagnosis not present

## 2019-09-24 DIAGNOSIS — I4819 Other persistent atrial fibrillation: Secondary | ICD-10-CM | POA: Diagnosis not present

## 2019-09-24 DIAGNOSIS — K219 Gastro-esophageal reflux disease without esophagitis: Secondary | ICD-10-CM | POA: Diagnosis not present

## 2019-09-24 DIAGNOSIS — I482 Chronic atrial fibrillation, unspecified: Secondary | ICD-10-CM | POA: Diagnosis not present

## 2019-09-24 DIAGNOSIS — Z8614 Personal history of Methicillin resistant Staphylococcus aureus infection: Secondary | ICD-10-CM | POA: Diagnosis not present

## 2019-09-24 DIAGNOSIS — Z79899 Other long term (current) drug therapy: Secondary | ICD-10-CM | POA: Diagnosis not present

## 2019-09-24 DIAGNOSIS — I2510 Atherosclerotic heart disease of native coronary artery without angina pectoris: Secondary | ICD-10-CM | POA: Diagnosis not present

## 2019-09-24 DIAGNOSIS — Z7901 Long term (current) use of anticoagulants: Secondary | ICD-10-CM | POA: Diagnosis not present

## 2019-09-24 DIAGNOSIS — G894 Chronic pain syndrome: Secondary | ICD-10-CM | POA: Diagnosis not present

## 2019-09-24 DIAGNOSIS — M19012 Primary osteoarthritis, left shoulder: Secondary | ICD-10-CM | POA: Diagnosis not present

## 2019-09-24 DIAGNOSIS — I509 Heart failure, unspecified: Secondary | ICD-10-CM | POA: Diagnosis not present

## 2019-09-24 DIAGNOSIS — Z9181 History of falling: Secondary | ICD-10-CM | POA: Diagnosis not present

## 2019-09-24 DIAGNOSIS — M109 Gout, unspecified: Secondary | ICD-10-CM | POA: Diagnosis not present

## 2019-09-24 DIAGNOSIS — Z7952 Long term (current) use of systemic steroids: Secondary | ICD-10-CM | POA: Diagnosis not present

## 2019-09-24 DIAGNOSIS — R269 Unspecified abnormalities of gait and mobility: Secondary | ICD-10-CM | POA: Diagnosis not present

## 2019-09-24 DIAGNOSIS — Z8701 Personal history of pneumonia (recurrent): Secondary | ICD-10-CM | POA: Diagnosis not present

## 2019-09-24 DIAGNOSIS — N390 Urinary tract infection, site not specified: Secondary | ICD-10-CM | POA: Diagnosis not present

## 2019-09-24 DIAGNOSIS — Z87891 Personal history of nicotine dependence: Secondary | ICD-10-CM | POA: Diagnosis not present

## 2019-09-24 DIAGNOSIS — M6281 Muscle weakness (generalized): Secondary | ICD-10-CM | POA: Diagnosis not present

## 2019-09-24 DIAGNOSIS — Z955 Presence of coronary angioplasty implant and graft: Secondary | ICD-10-CM | POA: Diagnosis not present

## 2019-09-24 DIAGNOSIS — R531 Weakness: Secondary | ICD-10-CM | POA: Diagnosis not present

## 2019-09-24 DIAGNOSIS — Z792 Long term (current) use of antibiotics: Secondary | ICD-10-CM | POA: Diagnosis not present

## 2019-09-24 DIAGNOSIS — I110 Hypertensive heart disease with heart failure: Secondary | ICD-10-CM | POA: Diagnosis not present

## 2019-09-24 DIAGNOSIS — Z951 Presence of aortocoronary bypass graft: Secondary | ICD-10-CM | POA: Diagnosis not present

## 2019-09-24 DIAGNOSIS — D509 Iron deficiency anemia, unspecified: Secondary | ICD-10-CM | POA: Diagnosis not present

## 2019-09-24 DIAGNOSIS — N400 Enlarged prostate without lower urinary tract symptoms: Secondary | ICD-10-CM | POA: Diagnosis not present

## 2019-09-24 DIAGNOSIS — I5033 Acute on chronic diastolic (congestive) heart failure: Secondary | ICD-10-CM | POA: Diagnosis not present

## 2019-09-24 DIAGNOSIS — Z862 Personal history of diseases of the blood and blood-forming organs and certain disorders involving the immune mechanism: Secondary | ICD-10-CM | POA: Diagnosis not present

## 2019-09-24 DIAGNOSIS — N319 Neuromuscular dysfunction of bladder, unspecified: Secondary | ICD-10-CM | POA: Diagnosis not present

## 2019-09-24 DIAGNOSIS — Z960 Presence of urogenital implants: Secondary | ICD-10-CM | POA: Diagnosis not present

## 2019-09-24 DIAGNOSIS — I10 Essential (primary) hypertension: Secondary | ICD-10-CM | POA: Diagnosis not present

## 2019-09-24 DIAGNOSIS — E785 Hyperlipidemia, unspecified: Secondary | ICD-10-CM | POA: Diagnosis not present

## 2019-09-24 DIAGNOSIS — Z8739 Personal history of other diseases of the musculoskeletal system and connective tissue: Secondary | ICD-10-CM | POA: Diagnosis not present

## 2019-09-24 DIAGNOSIS — R197 Diarrhea, unspecified: Secondary | ICD-10-CM | POA: Diagnosis not present

## 2019-09-25 DIAGNOSIS — Z23 Encounter for immunization: Secondary | ICD-10-CM | POA: Diagnosis not present

## 2019-09-26 DIAGNOSIS — M19012 Primary osteoarthritis, left shoulder: Secondary | ICD-10-CM | POA: Diagnosis not present

## 2019-09-26 DIAGNOSIS — M6281 Muscle weakness (generalized): Secondary | ICD-10-CM | POA: Diagnosis not present

## 2019-09-26 DIAGNOSIS — I5033 Acute on chronic diastolic (congestive) heart failure: Secondary | ICD-10-CM | POA: Diagnosis not present

## 2019-09-26 DIAGNOSIS — I110 Hypertensive heart disease with heart failure: Secondary | ICD-10-CM | POA: Diagnosis not present

## 2019-09-26 DIAGNOSIS — I4819 Other persistent atrial fibrillation: Secondary | ICD-10-CM | POA: Diagnosis not present

## 2019-09-26 DIAGNOSIS — I2510 Atherosclerotic heart disease of native coronary artery without angina pectoris: Secondary | ICD-10-CM | POA: Diagnosis not present

## 2019-09-28 DIAGNOSIS — I5033 Acute on chronic diastolic (congestive) heart failure: Secondary | ICD-10-CM | POA: Diagnosis not present

## 2019-09-28 DIAGNOSIS — I110 Hypertensive heart disease with heart failure: Secondary | ICD-10-CM | POA: Diagnosis not present

## 2019-09-28 DIAGNOSIS — I2510 Atherosclerotic heart disease of native coronary artery without angina pectoris: Secondary | ICD-10-CM | POA: Diagnosis not present

## 2019-09-28 DIAGNOSIS — I4819 Other persistent atrial fibrillation: Secondary | ICD-10-CM | POA: Diagnosis not present

## 2019-09-28 DIAGNOSIS — M6281 Muscle weakness (generalized): Secondary | ICD-10-CM | POA: Diagnosis not present

## 2019-09-28 DIAGNOSIS — M19012 Primary osteoarthritis, left shoulder: Secondary | ICD-10-CM | POA: Diagnosis not present

## 2019-09-30 DIAGNOSIS — M19012 Primary osteoarthritis, left shoulder: Secondary | ICD-10-CM | POA: Diagnosis not present

## 2019-09-30 DIAGNOSIS — I2510 Atherosclerotic heart disease of native coronary artery without angina pectoris: Secondary | ICD-10-CM | POA: Diagnosis not present

## 2019-09-30 DIAGNOSIS — I110 Hypertensive heart disease with heart failure: Secondary | ICD-10-CM | POA: Diagnosis not present

## 2019-09-30 DIAGNOSIS — M6281 Muscle weakness (generalized): Secondary | ICD-10-CM | POA: Diagnosis not present

## 2019-09-30 DIAGNOSIS — I4819 Other persistent atrial fibrillation: Secondary | ICD-10-CM | POA: Diagnosis not present

## 2019-09-30 DIAGNOSIS — I5033 Acute on chronic diastolic (congestive) heart failure: Secondary | ICD-10-CM | POA: Diagnosis not present

## 2019-10-03 DIAGNOSIS — I110 Hypertensive heart disease with heart failure: Secondary | ICD-10-CM | POA: Diagnosis not present

## 2019-10-03 DIAGNOSIS — I2510 Atherosclerotic heart disease of native coronary artery without angina pectoris: Secondary | ICD-10-CM | POA: Diagnosis not present

## 2019-10-03 DIAGNOSIS — I5033 Acute on chronic diastolic (congestive) heart failure: Secondary | ICD-10-CM | POA: Diagnosis not present

## 2019-10-03 DIAGNOSIS — I4819 Other persistent atrial fibrillation: Secondary | ICD-10-CM | POA: Diagnosis not present

## 2019-10-03 DIAGNOSIS — M6281 Muscle weakness (generalized): Secondary | ICD-10-CM | POA: Diagnosis not present

## 2019-10-03 DIAGNOSIS — M19012 Primary osteoarthritis, left shoulder: Secondary | ICD-10-CM | POA: Diagnosis not present

## 2019-10-04 DIAGNOSIS — I4819 Other persistent atrial fibrillation: Secondary | ICD-10-CM | POA: Diagnosis not present

## 2019-10-04 DIAGNOSIS — M19012 Primary osteoarthritis, left shoulder: Secondary | ICD-10-CM | POA: Diagnosis not present

## 2019-10-04 DIAGNOSIS — I110 Hypertensive heart disease with heart failure: Secondary | ICD-10-CM | POA: Diagnosis not present

## 2019-10-04 DIAGNOSIS — M6281 Muscle weakness (generalized): Secondary | ICD-10-CM | POA: Diagnosis not present

## 2019-10-04 DIAGNOSIS — I2510 Atherosclerotic heart disease of native coronary artery without angina pectoris: Secondary | ICD-10-CM | POA: Diagnosis not present

## 2019-10-04 DIAGNOSIS — I5033 Acute on chronic diastolic (congestive) heart failure: Secondary | ICD-10-CM | POA: Diagnosis not present

## 2019-10-06 DIAGNOSIS — M19012 Primary osteoarthritis, left shoulder: Secondary | ICD-10-CM | POA: Diagnosis not present

## 2019-10-06 DIAGNOSIS — M6281 Muscle weakness (generalized): Secondary | ICD-10-CM | POA: Diagnosis not present

## 2019-10-06 DIAGNOSIS — I5033 Acute on chronic diastolic (congestive) heart failure: Secondary | ICD-10-CM | POA: Diagnosis not present

## 2019-10-06 DIAGNOSIS — I110 Hypertensive heart disease with heart failure: Secondary | ICD-10-CM | POA: Diagnosis not present

## 2019-10-06 DIAGNOSIS — I2510 Atherosclerotic heart disease of native coronary artery without angina pectoris: Secondary | ICD-10-CM | POA: Diagnosis not present

## 2019-10-06 DIAGNOSIS — I4819 Other persistent atrial fibrillation: Secondary | ICD-10-CM | POA: Diagnosis not present

## 2019-10-07 DIAGNOSIS — M19012 Primary osteoarthritis, left shoulder: Secondary | ICD-10-CM | POA: Diagnosis not present

## 2019-10-07 DIAGNOSIS — M6281 Muscle weakness (generalized): Secondary | ICD-10-CM | POA: Diagnosis not present

## 2019-10-07 DIAGNOSIS — I4819 Other persistent atrial fibrillation: Secondary | ICD-10-CM | POA: Diagnosis not present

## 2019-10-07 DIAGNOSIS — I110 Hypertensive heart disease with heart failure: Secondary | ICD-10-CM | POA: Diagnosis not present

## 2019-10-07 DIAGNOSIS — I5033 Acute on chronic diastolic (congestive) heart failure: Secondary | ICD-10-CM | POA: Diagnosis not present

## 2019-10-07 DIAGNOSIS — I2510 Atherosclerotic heart disease of native coronary artery without angina pectoris: Secondary | ICD-10-CM | POA: Diagnosis not present

## 2019-10-10 DIAGNOSIS — I2510 Atherosclerotic heart disease of native coronary artery without angina pectoris: Secondary | ICD-10-CM | POA: Diagnosis not present

## 2019-10-10 DIAGNOSIS — I110 Hypertensive heart disease with heart failure: Secondary | ICD-10-CM | POA: Diagnosis not present

## 2019-10-10 DIAGNOSIS — M19012 Primary osteoarthritis, left shoulder: Secondary | ICD-10-CM | POA: Diagnosis not present

## 2019-10-10 DIAGNOSIS — I4819 Other persistent atrial fibrillation: Secondary | ICD-10-CM | POA: Diagnosis not present

## 2019-10-10 DIAGNOSIS — M6281 Muscle weakness (generalized): Secondary | ICD-10-CM | POA: Diagnosis not present

## 2019-10-10 DIAGNOSIS — I5033 Acute on chronic diastolic (congestive) heart failure: Secondary | ICD-10-CM | POA: Diagnosis not present

## 2019-10-12 DIAGNOSIS — M19012 Primary osteoarthritis, left shoulder: Secondary | ICD-10-CM | POA: Diagnosis not present

## 2019-10-12 DIAGNOSIS — I110 Hypertensive heart disease with heart failure: Secondary | ICD-10-CM | POA: Diagnosis not present

## 2019-10-12 DIAGNOSIS — M6281 Muscle weakness (generalized): Secondary | ICD-10-CM | POA: Diagnosis not present

## 2019-10-12 DIAGNOSIS — I2510 Atherosclerotic heart disease of native coronary artery without angina pectoris: Secondary | ICD-10-CM | POA: Diagnosis not present

## 2019-10-12 DIAGNOSIS — I5033 Acute on chronic diastolic (congestive) heart failure: Secondary | ICD-10-CM | POA: Diagnosis not present

## 2019-10-12 DIAGNOSIS — I4819 Other persistent atrial fibrillation: Secondary | ICD-10-CM | POA: Diagnosis not present

## 2019-10-14 DIAGNOSIS — N390 Urinary tract infection, site not specified: Secondary | ICD-10-CM | POA: Diagnosis not present

## 2019-10-18 DIAGNOSIS — I4819 Other persistent atrial fibrillation: Secondary | ICD-10-CM | POA: Diagnosis not present

## 2019-10-18 DIAGNOSIS — M6281 Muscle weakness (generalized): Secondary | ICD-10-CM | POA: Diagnosis not present

## 2019-10-18 DIAGNOSIS — I110 Hypertensive heart disease with heart failure: Secondary | ICD-10-CM | POA: Diagnosis not present

## 2019-10-18 DIAGNOSIS — I5033 Acute on chronic diastolic (congestive) heart failure: Secondary | ICD-10-CM | POA: Diagnosis not present

## 2019-10-18 DIAGNOSIS — M19012 Primary osteoarthritis, left shoulder: Secondary | ICD-10-CM | POA: Diagnosis not present

## 2019-10-18 DIAGNOSIS — I2510 Atherosclerotic heart disease of native coronary artery without angina pectoris: Secondary | ICD-10-CM | POA: Diagnosis not present

## 2019-10-20 DIAGNOSIS — G834 Cauda equina syndrome: Secondary | ICD-10-CM | POA: Diagnosis not present

## 2019-10-20 DIAGNOSIS — N319 Neuromuscular dysfunction of bladder, unspecified: Secondary | ICD-10-CM | POA: Diagnosis not present

## 2019-10-20 DIAGNOSIS — I5033 Acute on chronic diastolic (congestive) heart failure: Secondary | ICD-10-CM | POA: Diagnosis not present

## 2019-10-20 DIAGNOSIS — N401 Enlarged prostate with lower urinary tract symptoms: Secondary | ICD-10-CM | POA: Diagnosis not present

## 2019-10-20 DIAGNOSIS — M19012 Primary osteoarthritis, left shoulder: Secondary | ICD-10-CM | POA: Diagnosis not present

## 2019-10-20 DIAGNOSIS — M6281 Muscle weakness (generalized): Secondary | ICD-10-CM | POA: Diagnosis not present

## 2019-10-20 DIAGNOSIS — I4819 Other persistent atrial fibrillation: Secondary | ICD-10-CM | POA: Diagnosis not present

## 2019-10-20 DIAGNOSIS — R339 Retention of urine, unspecified: Secondary | ICD-10-CM | POA: Diagnosis not present

## 2019-10-20 DIAGNOSIS — I110 Hypertensive heart disease with heart failure: Secondary | ICD-10-CM | POA: Diagnosis not present

## 2019-10-20 DIAGNOSIS — N138 Other obstructive and reflux uropathy: Secondary | ICD-10-CM | POA: Diagnosis not present

## 2019-10-20 DIAGNOSIS — I2510 Atherosclerotic heart disease of native coronary artery without angina pectoris: Secondary | ICD-10-CM | POA: Diagnosis not present

## 2019-10-21 DIAGNOSIS — M6281 Muscle weakness (generalized): Secondary | ICD-10-CM | POA: Diagnosis not present

## 2019-10-21 DIAGNOSIS — I2510 Atherosclerotic heart disease of native coronary artery without angina pectoris: Secondary | ICD-10-CM | POA: Diagnosis not present

## 2019-10-21 DIAGNOSIS — M19012 Primary osteoarthritis, left shoulder: Secondary | ICD-10-CM | POA: Diagnosis not present

## 2019-10-21 DIAGNOSIS — I4819 Other persistent atrial fibrillation: Secondary | ICD-10-CM | POA: Diagnosis not present

## 2019-10-21 DIAGNOSIS — I110 Hypertensive heart disease with heart failure: Secondary | ICD-10-CM | POA: Diagnosis not present

## 2019-10-21 DIAGNOSIS — I5033 Acute on chronic diastolic (congestive) heart failure: Secondary | ICD-10-CM | POA: Diagnosis not present

## 2019-10-23 DIAGNOSIS — Z23 Encounter for immunization: Secondary | ICD-10-CM | POA: Diagnosis not present

## 2019-10-24 DIAGNOSIS — Z960 Presence of urogenital implants: Secondary | ICD-10-CM | POA: Diagnosis not present

## 2019-10-24 DIAGNOSIS — D509 Iron deficiency anemia, unspecified: Secondary | ICD-10-CM | POA: Diagnosis not present

## 2019-10-24 DIAGNOSIS — Z8614 Personal history of Methicillin resistant Staphylococcus aureus infection: Secondary | ICD-10-CM | POA: Diagnosis not present

## 2019-10-24 DIAGNOSIS — K219 Gastro-esophageal reflux disease without esophagitis: Secondary | ICD-10-CM | POA: Diagnosis not present

## 2019-10-24 DIAGNOSIS — Z79899 Other long term (current) drug therapy: Secondary | ICD-10-CM | POA: Diagnosis not present

## 2019-10-24 DIAGNOSIS — Z862 Personal history of diseases of the blood and blood-forming organs and certain disorders involving the immune mechanism: Secondary | ICD-10-CM | POA: Diagnosis not present

## 2019-10-24 DIAGNOSIS — Z8739 Personal history of other diseases of the musculoskeletal system and connective tissue: Secondary | ICD-10-CM | POA: Diagnosis not present

## 2019-10-24 DIAGNOSIS — Z9181 History of falling: Secondary | ICD-10-CM | POA: Diagnosis not present

## 2019-10-24 DIAGNOSIS — M109 Gout, unspecified: Secondary | ICD-10-CM | POA: Diagnosis not present

## 2019-10-24 DIAGNOSIS — I5033 Acute on chronic diastolic (congestive) heart failure: Secondary | ICD-10-CM | POA: Diagnosis not present

## 2019-10-24 DIAGNOSIS — M19012 Primary osteoarthritis, left shoulder: Secondary | ICD-10-CM | POA: Diagnosis not present

## 2019-10-24 DIAGNOSIS — Z7952 Long term (current) use of systemic steroids: Secondary | ICD-10-CM | POA: Diagnosis not present

## 2019-10-24 DIAGNOSIS — E785 Hyperlipidemia, unspecified: Secondary | ICD-10-CM | POA: Diagnosis not present

## 2019-10-24 DIAGNOSIS — M6281 Muscle weakness (generalized): Secondary | ICD-10-CM | POA: Diagnosis not present

## 2019-10-24 DIAGNOSIS — Z7901 Long term (current) use of anticoagulants: Secondary | ICD-10-CM | POA: Diagnosis not present

## 2019-10-24 DIAGNOSIS — I4819 Other persistent atrial fibrillation: Secondary | ICD-10-CM | POA: Diagnosis not present

## 2019-10-24 DIAGNOSIS — Z955 Presence of coronary angioplasty implant and graft: Secondary | ICD-10-CM | POA: Diagnosis not present

## 2019-10-24 DIAGNOSIS — Z8701 Personal history of pneumonia (recurrent): Secondary | ICD-10-CM | POA: Diagnosis not present

## 2019-10-24 DIAGNOSIS — F419 Anxiety disorder, unspecified: Secondary | ICD-10-CM | POA: Diagnosis not present

## 2019-10-24 DIAGNOSIS — N319 Neuromuscular dysfunction of bladder, unspecified: Secondary | ICD-10-CM | POA: Diagnosis not present

## 2019-10-24 DIAGNOSIS — Z792 Long term (current) use of antibiotics: Secondary | ICD-10-CM | POA: Diagnosis not present

## 2019-10-24 DIAGNOSIS — I110 Hypertensive heart disease with heart failure: Secondary | ICD-10-CM | POA: Diagnosis not present

## 2019-10-24 DIAGNOSIS — Z87891 Personal history of nicotine dependence: Secondary | ICD-10-CM | POA: Diagnosis not present

## 2019-10-24 DIAGNOSIS — I2510 Atherosclerotic heart disease of native coronary artery without angina pectoris: Secondary | ICD-10-CM | POA: Diagnosis not present

## 2019-10-24 DIAGNOSIS — Z951 Presence of aortocoronary bypass graft: Secondary | ICD-10-CM | POA: Diagnosis not present

## 2019-10-25 ENCOUNTER — Ambulatory Visit: Payer: Medicare Other | Admitting: Family Medicine

## 2019-10-27 DIAGNOSIS — E782 Mixed hyperlipidemia: Secondary | ICD-10-CM | POA: Diagnosis not present

## 2019-10-27 DIAGNOSIS — N319 Neuromuscular dysfunction of bladder, unspecified: Secondary | ICD-10-CM | POA: Diagnosis not present

## 2019-10-27 DIAGNOSIS — E440 Moderate protein-calorie malnutrition: Secondary | ICD-10-CM | POA: Diagnosis not present

## 2019-10-27 DIAGNOSIS — G894 Chronic pain syndrome: Secondary | ICD-10-CM | POA: Diagnosis not present

## 2019-10-27 DIAGNOSIS — D649 Anemia, unspecified: Secondary | ICD-10-CM | POA: Diagnosis not present

## 2019-10-27 DIAGNOSIS — F419 Anxiety disorder, unspecified: Secondary | ICD-10-CM | POA: Diagnosis not present

## 2019-10-27 DIAGNOSIS — E785 Hyperlipidemia, unspecified: Secondary | ICD-10-CM | POA: Diagnosis not present

## 2019-10-27 DIAGNOSIS — I10 Essential (primary) hypertension: Secondary | ICD-10-CM | POA: Diagnosis not present

## 2019-10-27 DIAGNOSIS — I2510 Atherosclerotic heart disease of native coronary artery without angina pectoris: Secondary | ICD-10-CM | POA: Diagnosis not present

## 2019-10-27 DIAGNOSIS — I509 Heart failure, unspecified: Secondary | ICD-10-CM | POA: Diagnosis not present

## 2019-10-27 DIAGNOSIS — L299 Pruritus, unspecified: Secondary | ICD-10-CM | POA: Diagnosis not present

## 2019-10-27 DIAGNOSIS — I482 Chronic atrial fibrillation, unspecified: Secondary | ICD-10-CM | POA: Diagnosis not present

## 2019-11-01 DIAGNOSIS — I2510 Atherosclerotic heart disease of native coronary artery without angina pectoris: Secondary | ICD-10-CM | POA: Diagnosis not present

## 2019-11-01 DIAGNOSIS — M6281 Muscle weakness (generalized): Secondary | ICD-10-CM | POA: Diagnosis not present

## 2019-11-01 DIAGNOSIS — I5033 Acute on chronic diastolic (congestive) heart failure: Secondary | ICD-10-CM | POA: Diagnosis not present

## 2019-11-01 DIAGNOSIS — I110 Hypertensive heart disease with heart failure: Secondary | ICD-10-CM | POA: Diagnosis not present

## 2019-11-01 DIAGNOSIS — I4819 Other persistent atrial fibrillation: Secondary | ICD-10-CM | POA: Diagnosis not present

## 2019-11-01 DIAGNOSIS — M19012 Primary osteoarthritis, left shoulder: Secondary | ICD-10-CM | POA: Diagnosis not present

## 2019-11-03 DIAGNOSIS — I5033 Acute on chronic diastolic (congestive) heart failure: Secondary | ICD-10-CM | POA: Diagnosis not present

## 2019-11-03 DIAGNOSIS — M19012 Primary osteoarthritis, left shoulder: Secondary | ICD-10-CM | POA: Diagnosis not present

## 2019-11-03 DIAGNOSIS — I4819 Other persistent atrial fibrillation: Secondary | ICD-10-CM | POA: Diagnosis not present

## 2019-11-03 DIAGNOSIS — I110 Hypertensive heart disease with heart failure: Secondary | ICD-10-CM | POA: Diagnosis not present

## 2019-11-03 DIAGNOSIS — M6281 Muscle weakness (generalized): Secondary | ICD-10-CM | POA: Diagnosis not present

## 2019-11-03 DIAGNOSIS — I2510 Atherosclerotic heart disease of native coronary artery without angina pectoris: Secondary | ICD-10-CM | POA: Diagnosis not present

## 2019-11-08 DIAGNOSIS — M19012 Primary osteoarthritis, left shoulder: Secondary | ICD-10-CM | POA: Diagnosis not present

## 2019-11-08 DIAGNOSIS — I4819 Other persistent atrial fibrillation: Secondary | ICD-10-CM | POA: Diagnosis not present

## 2019-11-08 DIAGNOSIS — N390 Urinary tract infection, site not specified: Secondary | ICD-10-CM | POA: Diagnosis not present

## 2019-11-08 DIAGNOSIS — I110 Hypertensive heart disease with heart failure: Secondary | ICD-10-CM | POA: Diagnosis not present

## 2019-11-08 DIAGNOSIS — I5033 Acute on chronic diastolic (congestive) heart failure: Secondary | ICD-10-CM | POA: Diagnosis not present

## 2019-11-08 DIAGNOSIS — M6281 Muscle weakness (generalized): Secondary | ICD-10-CM | POA: Diagnosis not present

## 2019-11-08 DIAGNOSIS — I2510 Atherosclerotic heart disease of native coronary artery without angina pectoris: Secondary | ICD-10-CM | POA: Diagnosis not present

## 2019-11-10 DIAGNOSIS — I2510 Atherosclerotic heart disease of native coronary artery without angina pectoris: Secondary | ICD-10-CM | POA: Diagnosis not present

## 2019-11-10 DIAGNOSIS — N319 Neuromuscular dysfunction of bladder, unspecified: Secondary | ICD-10-CM | POA: Diagnosis not present

## 2019-11-10 DIAGNOSIS — I10 Essential (primary) hypertension: Secondary | ICD-10-CM | POA: Diagnosis not present

## 2019-11-10 DIAGNOSIS — N390 Urinary tract infection, site not specified: Secondary | ICD-10-CM | POA: Diagnosis not present

## 2019-11-10 DIAGNOSIS — G894 Chronic pain syndrome: Secondary | ICD-10-CM | POA: Diagnosis not present

## 2019-11-10 DIAGNOSIS — Z0001 Encounter for general adult medical examination with abnormal findings: Secondary | ICD-10-CM | POA: Diagnosis not present

## 2019-11-10 DIAGNOSIS — I509 Heart failure, unspecified: Secondary | ICD-10-CM | POA: Diagnosis not present

## 2019-11-10 DIAGNOSIS — R269 Unspecified abnormalities of gait and mobility: Secondary | ICD-10-CM | POA: Diagnosis not present

## 2019-11-10 DIAGNOSIS — N400 Enlarged prostate without lower urinary tract symptoms: Secondary | ICD-10-CM | POA: Diagnosis not present

## 2019-11-10 DIAGNOSIS — R531 Weakness: Secondary | ICD-10-CM | POA: Diagnosis not present

## 2019-11-10 DIAGNOSIS — I482 Chronic atrial fibrillation, unspecified: Secondary | ICD-10-CM | POA: Diagnosis not present

## 2019-11-10 DIAGNOSIS — R944 Abnormal results of kidney function studies: Secondary | ICD-10-CM | POA: Diagnosis not present

## 2019-11-10 DIAGNOSIS — R197 Diarrhea, unspecified: Secondary | ICD-10-CM | POA: Diagnosis not present

## 2019-11-11 DIAGNOSIS — I4819 Other persistent atrial fibrillation: Secondary | ICD-10-CM | POA: Diagnosis not present

## 2019-11-11 DIAGNOSIS — I2510 Atherosclerotic heart disease of native coronary artery without angina pectoris: Secondary | ICD-10-CM | POA: Diagnosis not present

## 2019-11-11 DIAGNOSIS — M19012 Primary osteoarthritis, left shoulder: Secondary | ICD-10-CM | POA: Diagnosis not present

## 2019-11-11 DIAGNOSIS — I5033 Acute on chronic diastolic (congestive) heart failure: Secondary | ICD-10-CM | POA: Diagnosis not present

## 2019-11-11 DIAGNOSIS — I110 Hypertensive heart disease with heart failure: Secondary | ICD-10-CM | POA: Diagnosis not present

## 2019-11-11 DIAGNOSIS — M6281 Muscle weakness (generalized): Secondary | ICD-10-CM | POA: Diagnosis not present

## 2019-11-15 DIAGNOSIS — M19012 Primary osteoarthritis, left shoulder: Secondary | ICD-10-CM | POA: Diagnosis not present

## 2019-11-15 DIAGNOSIS — I2510 Atherosclerotic heart disease of native coronary artery without angina pectoris: Secondary | ICD-10-CM | POA: Diagnosis not present

## 2019-11-15 DIAGNOSIS — M6281 Muscle weakness (generalized): Secondary | ICD-10-CM | POA: Diagnosis not present

## 2019-11-15 DIAGNOSIS — I4819 Other persistent atrial fibrillation: Secondary | ICD-10-CM | POA: Diagnosis not present

## 2019-11-15 DIAGNOSIS — I5033 Acute on chronic diastolic (congestive) heart failure: Secondary | ICD-10-CM | POA: Diagnosis not present

## 2019-11-15 DIAGNOSIS — I110 Hypertensive heart disease with heart failure: Secondary | ICD-10-CM | POA: Diagnosis not present

## 2019-11-18 DIAGNOSIS — M19012 Primary osteoarthritis, left shoulder: Secondary | ICD-10-CM | POA: Diagnosis not present

## 2019-11-18 DIAGNOSIS — I4819 Other persistent atrial fibrillation: Secondary | ICD-10-CM | POA: Diagnosis not present

## 2019-11-18 DIAGNOSIS — I5033 Acute on chronic diastolic (congestive) heart failure: Secondary | ICD-10-CM | POA: Diagnosis not present

## 2019-11-18 DIAGNOSIS — I110 Hypertensive heart disease with heart failure: Secondary | ICD-10-CM | POA: Diagnosis not present

## 2019-11-18 DIAGNOSIS — I2510 Atherosclerotic heart disease of native coronary artery without angina pectoris: Secondary | ICD-10-CM | POA: Diagnosis not present

## 2019-11-18 DIAGNOSIS — M6281 Muscle weakness (generalized): Secondary | ICD-10-CM | POA: Diagnosis not present

## 2019-11-24 DIAGNOSIS — G834 Cauda equina syndrome: Secondary | ICD-10-CM | POA: Diagnosis not present

## 2019-11-24 DIAGNOSIS — N319 Neuromuscular dysfunction of bladder, unspecified: Secondary | ICD-10-CM | POA: Diagnosis not present

## 2019-11-24 DIAGNOSIS — Z466 Encounter for fitting and adjustment of urinary device: Secondary | ICD-10-CM | POA: Diagnosis not present

## 2019-11-24 DIAGNOSIS — N401 Enlarged prostate with lower urinary tract symptoms: Secondary | ICD-10-CM | POA: Diagnosis not present

## 2019-11-24 DIAGNOSIS — N138 Other obstructive and reflux uropathy: Secondary | ICD-10-CM | POA: Diagnosis not present

## 2019-11-24 DIAGNOSIS — R339 Retention of urine, unspecified: Secondary | ICD-10-CM | POA: Diagnosis not present

## 2019-11-25 ENCOUNTER — Encounter (HOSPITAL_COMMUNITY): Payer: Self-pay | Admitting: *Deleted

## 2019-11-25 ENCOUNTER — Other Ambulatory Visit: Payer: Self-pay

## 2019-11-25 ENCOUNTER — Emergency Department (HOSPITAL_COMMUNITY)
Admission: EM | Admit: 2019-11-25 | Discharge: 2019-11-25 | Disposition: A | Discharge status: Home / Self Care | Payer: Medicare Other | Attending: Emergency Medicine | Admitting: Emergency Medicine

## 2019-11-25 DIAGNOSIS — I5032 Chronic diastolic (congestive) heart failure: Secondary | ICD-10-CM | POA: Diagnosis not present

## 2019-11-25 DIAGNOSIS — N390 Urinary tract infection, site not specified: Secondary | ICD-10-CM | POA: Diagnosis not present

## 2019-11-25 DIAGNOSIS — R339 Retention of urine, unspecified: Secondary | ICD-10-CM | POA: Diagnosis not present

## 2019-11-25 DIAGNOSIS — Z87891 Personal history of nicotine dependence: Secondary | ICD-10-CM | POA: Diagnosis not present

## 2019-11-25 DIAGNOSIS — I2510 Atherosclerotic heart disease of native coronary artery without angina pectoris: Secondary | ICD-10-CM | POA: Diagnosis not present

## 2019-11-25 DIAGNOSIS — N181 Chronic kidney disease, stage 1: Secondary | ICD-10-CM | POA: Insufficient documentation

## 2019-11-25 DIAGNOSIS — Z951 Presence of aortocoronary bypass graft: Secondary | ICD-10-CM | POA: Diagnosis not present

## 2019-11-25 DIAGNOSIS — I4891 Unspecified atrial fibrillation: Secondary | ICD-10-CM | POA: Diagnosis not present

## 2019-11-25 DIAGNOSIS — I130 Hypertensive heart and chronic kidney disease with heart failure and stage 1 through stage 4 chronic kidney disease, or unspecified chronic kidney disease: Secondary | ICD-10-CM | POA: Insufficient documentation

## 2019-11-25 DIAGNOSIS — Z79899 Other long term (current) drug therapy: Secondary | ICD-10-CM | POA: Insufficient documentation

## 2019-11-25 LAB — URINALYSIS, ROUTINE W REFLEX MICROSCOPIC
Bacteria, UA: NONE SEEN
Bilirubin Urine: NEGATIVE
Glucose, UA: NEGATIVE mg/dL
Ketones, ur: NEGATIVE mg/dL
Nitrite: POSITIVE — AB
Protein, ur: NEGATIVE mg/dL
Specific Gravity, Urine: 1.013 (ref 1.005–1.030)
WBC, UA: >50 WBC/hpf — ABNORMAL HIGH (ref 0–5)
pH: 6 (ref 5.0–8.0)

## 2019-11-25 LAB — CBC WITH DIFFERENTIAL/PLATELET
Abs Immature Granulocytes: 0.05 10*3/uL (ref 0.00–0.07)
Basophils Absolute: 0 10*3/uL (ref 0.0–0.1)
Basophils Relative: 0 %
Eosinophils Absolute: 0.1 10*3/uL (ref 0.0–0.5)
Eosinophils Relative: 2 %
HCT: 30.5 % — ABNORMAL LOW (ref 39.0–52.0)
Hemoglobin: 9.2 g/dL — ABNORMAL LOW (ref 13.0–17.0)
Immature Granulocytes: 1 %
Lymphocytes Relative: 10 %
Lymphs Abs: 0.7 10*3/uL (ref 0.7–4.0)
MCH: 25.6 pg — ABNORMAL LOW (ref 26.0–34.0)
MCHC: 30.2 g/dL (ref 30.0–36.0)
MCV: 84.7 fL (ref 80.0–100.0)
Monocytes Absolute: 0.6 10*3/uL (ref 0.1–1.0)
Monocytes Relative: 9 %
Neutro Abs: 5.7 10*3/uL (ref 1.7–7.7)
Neutrophils Relative %: 78 %
Platelets: 286 10*3/uL (ref 150–400)
RBC: 3.6 MIL/uL — ABNORMAL LOW (ref 4.22–5.81)
RDW: 17.4 % — ABNORMAL HIGH (ref 11.5–15.5)
WBC: 7.2 10*3/uL (ref 4.0–10.5)
nRBC: 0 % (ref 0.0–0.2)

## 2019-11-25 LAB — BASIC METABOLIC PANEL
Anion gap: 10 (ref 5–15)
BUN: 29 mg/dL — ABNORMAL HIGH (ref 8–23)
CO2: 26 mmol/L (ref 22–32)
Calcium: 8.8 mg/dL — ABNORMAL LOW (ref 8.9–10.3)
Chloride: 102 mmol/L (ref 98–111)
Creatinine, Ser: 1.36 mg/dL — ABNORMAL HIGH (ref 0.61–1.24)
GFR calc Af Amer: 58 mL/min — ABNORMAL LOW (ref >60)
GFR calc non Af Amer: 50 mL/min — ABNORMAL LOW (ref >60)
Glucose, Bld: 88 mg/dL (ref 70–99)
Potassium: 4.2 mmol/L (ref 3.5–5.1)
Sodium: 138 mmol/L (ref 135–145)

## 2019-11-25 MED ORDER — CEPHALEXIN 250 MG PO CAPS: 250.0000 mg | ORAL_CAPSULE | Freq: Four times a day (QID) | ORAL | 0 refills | Status: DC

## 2019-11-25 MED ORDER — SODIUM CHLORIDE 0.9 % IV SOLN: 1.0000 g | Freq: Once | INTRAVENOUS | Status: DC

## 2019-11-25 MED ORDER — CEFTRIAXONE SODIUM 1 G IJ SOLR
1.0000 g | Freq: Once | INTRAMUSCULAR | Status: AC
  Administered 2019-11-25: 14:00:00 1 g via INTRAMUSCULAR
  Filled 2019-11-25: qty 10

## 2019-11-25 MED ORDER — LIDOCAINE HCL (PF) 1 % IJ SOLN
INTRAMUSCULAR | Status: AC
  Administered 2019-11-25: 14:00:00 2 mL
  Filled 2019-11-25: qty 2

## 2019-11-25 NOTE — ED Triage Notes (Signed)
Urinary retention, states he needs a catheter

## 2019-11-25 NOTE — Discharge Instructions (Addendum)
You were seen in the emergency department for unable to catheterize herself and urinary retention.  You had a catheter placed with improvement in your symptoms.  Your urine looks like signs of infection.  We gave you an IV dose of antibiotics and are sending you home with a prescription to finish for 1 week of antibiotics.  Follow-up with your urologist.  Return to the emergency department if any worsening or concerning symptoms

## 2019-11-25 NOTE — ED Notes (Signed)
Reports Foley catheter removed on Yesterday by urology clinic.  Pt id incontinent of urine this am.  MD is practicing in [facility] today and pt says she is not able to travel to [place].

## 2019-11-25 NOTE — ED Provider Notes (Signed)
[facility] EMERGENCY DEPARTMENT Provider Note   CSN: [id number] Arrival date & time: 11/25/19  0941     History Chief Complaint  Patient presents with  . Urinary Retention    [name] is a 79 y.o. male.  79 year old male who has had a chronic catheter in for the last few months due to some neurologic injury from a back surgery.  Urology took the catheter out yesterday and he was was to self cath but has been unable to cath.  He is here to have a catheter placed again.  He has not a lot of sensation in that area so he does not know if his bladder is full but he said he has been leaking in his diaper.  No fevers or chills.  Otherwise no new complaints.  The history is provided by the patient.  Male GU Problem Presenting symptoms: no dysuria   Presenting symptoms comment:  Retention Context comment:  After catheter removal Relieved by:  Nothing Worsened by:  Nothing Ineffective treatments: tried to self cath. Associated symptoms: urinary incontinence and urinary retention   Associated symptoms: no abdominal pain and no fever        Past Medical History:  Diagnosis Date  . Acute kidney failure, unspecified ([place])   . Acute renal failure ([place]) 04/17/2013  . Anemia, iron deficiency 09/09/2013  . Anxiety   . Anxiety disorder, unspecified   . Arthritis   . Atherosclerotic heart disease of native coronary artery without angina pectoris   . Atrial fibrillation ([place])   . 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 Cauda equina syndrome ([place])   . Chronic diastolic (congestive) heart failure ([place])   . Chronic kidney disease, stage 1   . Coronary artery disease    a. s/p CABG in 2000 with low-risk NST in 01/2017  . Diarrhea, unspecified   . Disorder of blood    BEEN TREATED BY DERMATOLOGIST X 4 YRS..."BLOOD BLISTERS"  . Epistaxis   . Essential (primary) hypertension   . Gastro-esophageal reflux disease without esophagitis   . Gout   . Gout, unspecified     . HTN (hypertension)    sees Dr. [name] in [place]  . Hx MRSA infection    rt shoulder  . Hyperglycemia, unspecified   . Hyperlipidemia   . Hyperlipidemia, unspecified   . Inflammatory disease of prostate, unspecified   . Iron deficiency anemia, unspecified   . Localized edema   . Low back pain   . Osteonecrosis, unspecified ([place])   . Other cervical disc degeneration, unspecified cervical region   . Personal history of Methicillin resistant Staphylococcus aureus infection   . Pneumonia    "I've had it 3-4 times"  . Pneumonia, unspecified organism   . Retention of urine, unspecified   . Secondary osteoarthritis, unspecified site   . Small bowel obstruction ([place])   . Small bowel problem    HAD ALOT OF SCAR TISSUE FROM PREVIOUS SURGERIES..NG WAS INSERTED ...Marland KitchenMarland KitchenNO SURGERY NEEDED...Marland KitchenMarland KitchenIN FOR 8 DAYS  . Unspecified abnormalities of gait and mobility   . Unspecified atrial fibrillation ([place])   . Ventral hernia   . Ventral hernia without obstruction or gangrene     Patient Active Problem List   Diagnosis Date Noted  . Hyperglycemia, unspecified   . Cauda equina syndrome ([place])   . Low back pain   . Atherosclerotic heart disease of native coronary artery without angina pectoris   . Essential (primary) hypertension   .  Gastro-esophageal reflux disease without esophagitis   . Hyperlipidemia, unspecified   . Secondary osteoarthritis, unspecified site   . Anxiety disorder, unspecified   . Chronic diastolic (congestive) heart failure (HCC)   . Chronic kidney disease, stage 1   . Diarrhea, unspecified   . Epistaxis   . Inflammatory disease of prostate, unspecified   . Localized edema   . Other cervical disc degeneration, unspecified cervical region   . Retention of urine, unspecified   . Unspecified abnormalities of gait and mobility   . Unspecified atrial fibrillation (HCC)   . Persistent atrial fibrillation (HCC) 05/30/2019  . 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 Infection of urinary tract 10/26/2015  . Intractable back pain 10/22/2015  . Back pain 10/22/2015  . Muscle weakness (generalized) 12/05/2013  . Tight fascia 12/05/2013  . Decreased range of motion of right shoulder 12/05/2013  . Decreased range of motion of left shoulder 12/05/2013  . Bilateral leg weakness 11/21/2013  . Poor balance 11/21/2013  . Difficulty in walking(719.7) 11/21/2013  . Shoulder pain, left 09/12/2013  . Anemia, iron deficiency 09/09/2013  . Dyspnea 09/07/2013  . UTI (urinary tract infection) 09/07/2013  . Acute CHF (congestive heart failure) (HCC) 09/07/2013  . Elevated troponin 09/07/2013  . Fever and chills 09/06/2013  . Altered mental status 09/05/2013  . Neurogenic bowel 08/22/2013  . 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 Gout flare 08/22/2013  . Postoperative wound infection 08/22/2013  . Lumbar degenerative disc disease 08/02/2013  . Post-operative pain 07/29/2013  . Osteonecrosis (HCC) 05/05/2013  . Small bowel obstruction (HCC) 04/17/2013  . Acute renal failure (HCC) 04/17/2013  . Medial meniscus, posterior horn derangement 02/17/2013  . Knee sprain and strain 02/17/2013  . Trigger point with neck pain 03/18/2012  . Rotator cuff tear arthropathy of right shoulder 09/08/2011  . CAD in native artery 01/09/2011  . Ankle fracture 12/25/2010  . Contusion of shoulder, left 12/25/2010  . PEMPHIGUS VULGARIS 07/02/2010  . MRSA 05/13/2010  . PRURITUS 05/13/2010  . SPINAL STENOSIS, LUMBAR 05/13/2010  . HLD (hyperlipidemia) 04/24/2010  . GASTROESOPHAGEAL REFLUX DISEASE 04/24/2010  . VENTRAL HERNIA, INCISIONAL 04/24/2010  . DEGENERATIVE JOINT DISEASE 04/24/2010  . PYOGENIC ARTHRITIS, SHOULDER REGION 02/04/2010  . Essential hypertension 01/01/2010  . RUPTURE ROTATOR CUFF 02/19/2009    Past Surgical History:  Procedure Laterality Date  . ANTERIOR CERVICAL CORPECTOMY  12/17/11  . ANTERIOR CERVICAL CORPECTOMY  12/17/2011    Procedure: ANTERIOR CERVICAL CORPECTOMY;  Surgeon: Karn Cassis, MD;  Location: [facility];  Service: Neurosurgery;  Laterality: N/A;  Anterior Cervical Decompression Fusion Five to Thoracic Two with plating  . BACK SURGERY     lumbar  . CATARACT EXTRACTION W/ INTRAOCULAR LENS  IMPLANT, BILATERAL  ? 2011  . CHOLECYSTECTOMY  2006 "or after"  . CORONARY ANGIOPLASTY WITH STENT PLACEMENT  2012  . CORONARY ARTERY BYPASS GRAFT  2000   CABG X5  . EYE SURGERY     bilateral cataract  . INCISION AND DRAINAGE OF WOUND  ~ 20ll; 12/03/11   "had infection in my right"  . LUMBAR LAMINECTOMY/DECOMPRESSION MICRODISCECTOMY  06/13/2011   Procedure: LUMBAR LAMINECTOMY/DECOMPRESSION MICRODISCECTOMY;  Surgeon: Karn Cassis;  Location: [facility];  Service: Neurosurgery;  Laterality: N/A;  Lumbar three, lumbar four-five Laminectomy  . LUMBAR LAMINECTOMY/DECOMPRESSION MICRODISCECTOMY Right 06/17/2013   Procedure: LUMBAR LAMINECTOMY/DECOMPRESSION MICRODISCECTOMY 1 LEVEL;  Surgeon: Karn Cassis, MD;  Location: [facility];  Service: Neurosurgery;  Laterality:  Right;  Right L3-4 Microdiskectomy  . LUMBAR WOUND DEBRIDEMENT N/A 08/02/2013   Procedure: INCISION AND DRAINAGE OF LUMBAR WOUND DEBRIDEMENT;  Surgeon: Karn CassisErnesto M Botero, MD;  Location: [facility];  Service: Neurosurgery;  Laterality: N/A;  . PERIPHERALLY INSERTED CENTRAL CATHETER INSERTION  2011 & 11/2011  . SHOULDER ARTHROSCOPY Left 09/13/2013   Procedure: ARTHROSCOPIC IRRIGATION AND DEBRIDEMENT, SYNOVECTOMY ,  LEFT SHOULDER ;  Surgeon: Thera FlakeW D Caffrey Jr., MD;  Location: [facility];  Service: Orthopedics;  Laterality: Left;  . SHOULDER OPEN ROTATOR CUFF REPAIR  ~ 2011   right  . STERNAL SURG  2000   HAS HAD 5-6 ON HIS STERNUM; "caught MRSA in it"  . TONSILLECTOMY  1949       Family History  Problem Relation Age of Onset  . Heart disease Other   . Hypertension Mother   . Hypertension Maternal Aunt   . Hypertension Maternal Uncle   . Anesthesia  problems Neg Hx   . Hypotension Neg Hx   . Malignant hyperthermia Neg Hx   . Pseudochol deficiency Neg Hx     Social History   Tobacco Use  . Smoking status: Former Smoker    Packs/day: 1.00    Years: 1.00    Pack years: 1.00    Types: Cigarettes    Start date: 12/29/1959    Quit date: 12/27/1998    Years since quitting: 20.9  . Smokeless tobacco: Never Used  Substance Use Topics  . Alcohol use: No    Alcohol/week: 0.0 standard drinks  . Drug use: No    Home Medications Prior to Admission medications   Medication Sig Start Date End Date Taking? Authorizing Provider  acetaminophen (TYLENOL) 325 MG tablet Take 1-2 tablets (325-650 mg total) by mouth every 4 (four) hours as needed for mild pain. 08/19/13   Love, Evlyn KannerPamela S, PA-C  ALPRAZolam Prudy Feeler(XANAX) 0.5 MG tablet Take 0.25-0.5 mg by mouth 2 (two) times daily as needed for anxiety.    [provider]  atorvastatin (LIPITOR) 40 MG tablet TAKE 1 TABLET BY MOUTH EVERY DAY 09/03/15   Laqueta LindenKoneswaran, Suresh A, MD  docusate sodium (COLACE) 100 MG capsule Take 100 mg by mouth daily.    [provider]  doxazosin (CARDURA) 8 MG tablet Take 8 mg by mouth daily.    [provider]  finasteride (PROSCAR) 5 MG tablet Take 5 mg by mouth daily.    [provider]  furosemide (LASIX) 20 MG tablet Take 1 tablet (20 mg total) by mouth daily. 05/31/19 08/29/19  Dyann KiefLenze, Michele M, PA-C  HYDROcodone-acetaminophen (NORCO/VICODIN) 5-325 MG tablet Take 1-2 tablets by mouth every 6 (six) hours as needed. 06/20/19   Maxwell CaulLayden, Lindsey A, PA-C  Multiple Vitamin (MULTIVITAMIN) tablet Take 1 tablet by mouth daily.    [provider]  nitroGLYCERIN (NITROSTAT) 0.4 MG SL tablet Place 0.4 mg under the tongue every 5 (five) minutes as needed for chest pain. May take up to 3 doses per episode. Contact MD or 911 if unresolved chest pain continues. 01/09/11   Jodelle GrossLawrence, Kathryn M, NP  predniSONE (DELTASONE) 10 MG tablet Take 5-10 mg by mouth as  directed. 01/06/18   [provider]  rivaroxaban (XARELTO) 20 MG TABS tablet Take 1 tablet (20 mg total) by mouth daily with supper. 08/23/19   Laqueta LindenKoneswaran, Suresh A, MD  tamsulosin (FLOMAX) 0.4 MG CAPS capsule Take 0.4 mg by mouth.    [provider]  traMADol (ULTRAM) 50 MG tablet Take 50 mg  by mouth every 6 (six) hours as needed.    [provider]    Allergies    Morphine, Metoprolol, and Rifampin  Review of Systems   Review of Systems  Constitutional: Negative for fever.  HENT: Negative for sore throat.   Eyes: Negative for visual disturbance.  Respiratory: Negative for shortness of breath.   Cardiovascular: Negative for chest pain.  Gastrointestinal: Negative for abdominal pain.  Genitourinary: Positive for bladder incontinence. Negative for dysuria.  Musculoskeletal: Negative for neck pain.  Skin: Negative for rash.  Neurological: Positive for weakness. Negative for headaches.    Physical Exam Updated Vital Signs BP 123/90   Pulse (!) 57   Temp 98.1 F (36.7 C)   Resp 20   Physical Exam Vitals and nursing note reviewed.  Constitutional:      Appearance: Normal appearance. He is well-developed.  HENT:     Head: Normocephalic and atraumatic.     Mouth/Throat:     Mouth: Mucous membranes are moist.     Pharynx: Oropharynx is clear.  Eyes:     Conjunctiva/sclera: Conjunctivae normal.  Cardiovascular:     Rate and Rhythm: Tachycardia present. Rhythm irregular.     Heart sounds: No murmur.  Pulmonary:     Effort: Pulmonary effort is normal. No respiratory distress.     Breath sounds: Normal breath sounds.  Abdominal:     Palpations: Abdomen is soft.     Tenderness: There is no abdominal tenderness.  Musculoskeletal:     Cervical back: Neck supple.     Right lower leg: No edema.     Left lower leg: No edema.  Skin:    General: Skin is warm and dry.     Capillary Refill: Capillary refill takes less than 2 seconds.  Neurological:      Mental Status: He is alert. Mental status is at baseline.     ED Results / Procedures / Treatments   Labs (all labs ordered are listed, but only abnormal results are displayed) Labs Reviewed  URINALYSIS, ROUTINE W REFLEX MICROSCOPIC - Abnormal; Notable for the following components:      Result Value   APPearance HAZY (*)    Hgb urine dipstick MODERATE (*)    Nitrite POSITIVE (*)    Leukocytes,Ua LARGE (*)    WBC, UA >50 (*)    All other components within normal limits  BASIC METABOLIC PANEL - Abnormal; Notable for the following components:   BUN 29 (*)    Creatinine, Ser 1.36 (*)    Calcium 8.8 (*)    GFR calc non Af Amer 50 (*)    GFR calc Af Amer 58 (*)    All other components within normal limits  CBC WITH DIFFERENTIAL/PLATELET - Abnormal; Notable for the following components:   RBC 3.60 (*)    Hemoglobin 9.2 (*)    HCT 30.5 (*)    MCH 25.6 (*)    RDW 17.4 (*)    All other components within normal limits  URINE CULTURE    EKG EKG Interpretation  Date/Time:  Friday November 25 2019 11:02:47 EDT Ventricular Rate:  98 PR Interval:    QRS Duration: 77 QT Interval:  309 QTC Calculation: 395 R Axis:   73 Text Interpretation: Atrial fibrillation Nonspecific repol abnormality, diffuse leads increased rate and ischemic changes compared with prior 12/20 Confirmed by Meridee Score [phone]) on 11/25/2019 11:12:58 AM   Radiology No results found.  Procedures Procedures (including critical care time)  Medications Ordered  in ED Medications - No data to display  ED Course  I have reviewed the triage vital signs and the nursing notes.  Pertinent labs & imaging results that were available during my care of the patient were reviewed by me and considered in my medical decision making (see chart for details).  Clinical Course as of Nov 24 1856  Fri Nov 25, 2019  1222 Updated the patient on his labs and the possibility of urine infection.  Patient states he knows when he has  an infection and he does feel like there is probably 1.  We will give an IV dose of antibiotics and then send him home with a prescription.   [MB]  1856 Glucose: 88 [MB]    Clinical Course User Index [MB] Terrilee Files, MD   MDM Rules/Calculators/A&P                     This patient complains of difficulty passing urine; this involves an extensive number of treatment Options and is a complaint that carries with it a high risk of complications and Morbidity. The differential includes urinary retention, infection, renal failure, metabolic derangement  I ordered, reviewed and interpreted labs, which included CBC showing normal white count, low hemoglobin but better than baseline, elevated creatinine of 1.36 but better than baseline.  Urinalysis also looks infected nitrite positive greater than 50 whites. I ordered medication ceftriaxone Previous records obtained and reviewed in epic including urine culture sensitivities  Critical Interventions: None  After the interventions stated above, I reevaluated the patient and found symptoms to be improved after placement of urinary catheter by nursing.  Given antibiotics and sent home with a prescription.  Recommend close follow-up with urology, return instructions discussed   Final Clinical Impression(s) / ED Diagnoses Final diagnoses:  Urinary retention  Lower urinary tract infectious disease    Rx / DC Orders ED Discharge Orders         Ordered    cephALEXin (KEFLEX) 250 MG capsule  4 times daily     11/25/19 1243           Terrilee Files, MD 11/25/19 1858

## 2019-11-27 LAB — URINE CULTURE: Culture: 30000 — AB

## 2019-11-28 ENCOUNTER — Telehealth: Payer: Self-pay | Admitting: *Deleted

## 2019-11-28 NOTE — Telephone Encounter (Signed)
Post ED Visit - Positive Culture Follow-up  Culture report reviewed by antimicrobial stewardship pharmacist: [facility] []  , Pharm.D. []  Enzo Bi, Pharm.D., BCPS AQ-ID []  , Pharm.D., BCPS []  Celedonio Miyamoto, Pharm.D., BCPS []  Faith, Garvin Fila.D., BCPS, AAHIVP []  , Pharm.D., BCPS, AAHIVP []  Georgina Pillion, PharmD, BCPS []  , PharmD, BCPS []  [place], PharmD, BCPS []  [address], PharmD []  , PharmD, BCPS []  Estella Husk, PharmD , PharmD  [facility] []  , PharmD []  Phillips Climes, PharmD []  , PharmD []  Agapito Games, Rph []  ) Verlan Friends, PharmD []  , PharmD []  Mervyn Gay, PharmD []  , PharmD []  Vinnie Level, PharmD []  Daylene Posey, PharmD []  [facility], PharmD []  , PharmD []  Len Childs, PharmD   Positive urine culture S/P chronic cath, no fever, no chills and no further patient follow-up is required at this time.  [facility] 11/28/2019, 11:21 AM

## 2019-12-08 DIAGNOSIS — R339 Retention of urine, unspecified: Secondary | ICD-10-CM | POA: Diagnosis not present

## 2019-12-08 DIAGNOSIS — N319 Neuromuscular dysfunction of bladder, unspecified: Secondary | ICD-10-CM | POA: Diagnosis not present

## 2019-12-14 ENCOUNTER — Other Ambulatory Visit: Payer: Self-pay

## 2019-12-14 ENCOUNTER — Encounter (HOSPITAL_COMMUNITY): Payer: Self-pay | Admitting: Physical Therapy

## 2019-12-14 ENCOUNTER — Ambulatory Visit (HOSPITAL_COMMUNITY): Payer: Medicare Other | Attending: Internal Medicine | Admitting: Physical Therapy

## 2019-12-14 DIAGNOSIS — M6281 Muscle weakness (generalized): Secondary | ICD-10-CM

## 2019-12-14 DIAGNOSIS — R29898 Other symptoms and signs involving the musculoskeletal system: Secondary | ICD-10-CM | POA: Diagnosis not present

## 2019-12-14 DIAGNOSIS — R2689 Other abnormalities of gait and mobility: Secondary | ICD-10-CM | POA: Diagnosis not present

## 2019-12-14 NOTE — Patient Instructions (Signed)
Access Code: [email or web address] URL: [email or web address] Date: 12/14/2019 Prepared by: [facility] Teniola Tseng  Exercises Seated Long Arc Quad - 2-3 x daily - 7 x weekly - 1 sets - 10 reps - 10 second hold Seated March - 2-3 x daily - 7 x weekly - 2 sets - 10 reps

## 2019-12-14 NOTE — Therapy (Signed)
[place], [place], [address] Phone: [phone]   Fax:  [phone]  Physical Therapy Evaluation  Patient Details  Name: DIMITRY HOLSWORTH MRN: [id number] Date of Birth: 24-Oct-1940 Referring Provider (PT): Allyn Kenner MD   Encounter Date: 12/14/2019  PT End of Session - 12/14/19 1209    Visit Number  1    Number of Visits  18    Date for PT Re-Evaluation  01/25/20    Authorization Type  primary: medicare; secondary [employer] [place] (follow med guidlines)    Progress Note Due on Visit  10    PT Start Time  1118    PT Stop Time  1159    PT Time Calculation (min)  41 min    Equipment Utilized During Treatment  Gait belt   wheelchair, LLE brace   Activity Tolerance  Patient tolerated treatment well;Patient limited by fatigue    Behavior During Therapy  WFL for tasks assessed/performed       Past Medical History:  Diagnosis Date  . Acute kidney failure, unspecified ([place])   . Acute renal failure ([place]) 04/17/2013  . Anemia, iron deficiency 09/09/2013  . Anxiety   . Anxiety disorder, unspecified   . Arthritis   . Atherosclerotic heart disease of native coronary artery without angina pectoris   . Atrial fibrillation ([place])   . 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 Cauda equina syndrome ([place])   . Chronic diastolic (congestive) heart failure ([place])   . Chronic kidney disease, stage 1   . Coronary artery disease    a. s/p CABG in 2000 with low-risk NST in 01/2017  . Diarrhea, unspecified   . Disorder of blood    BEEN TREATED BY DERMATOLOGIST X 4 YRS..."BLOOD BLISTERS"  . Epistaxis   . Essential (primary) hypertension   . Gastro-esophageal reflux disease without esophagitis   . Gout   . Gout, unspecified   . HTN (hypertension)    sees Dr. [name] in [place]  . Hx MRSA infection    rt shoulder  . Hyperglycemia, unspecified   . Hyperlipidemia   . Hyperlipidemia, unspecified   .  Inflammatory disease of prostate, unspecified   . Iron deficiency anemia, unspecified   . Localized edema   . Low back pain   . Osteonecrosis, unspecified ([place])   . Other cervical disc degeneration, unspecified cervical region   . Personal history of Methicillin resistant Staphylococcus aureus infection   . Pneumonia    "I've had it 3-4 times"  . Pneumonia, unspecified organism   . Retention of urine, unspecified   . Secondary osteoarthritis, unspecified site   . Small bowel obstruction ([place])   . Small bowel problem    HAD ALOT OF SCAR TISSUE FROM PREVIOUS SURGERIES..NG WAS INSERTED ...Marland KitchenMarland KitchenNO SURGERY NEEDED...Marland KitchenMarland KitchenIN FOR 8 DAYS  . Unspecified abnormalities of gait and mobility   . Unspecified atrial fibrillation ([place])   . Ventral hernia   . Ventral hernia without obstruction or gangrene     Past Surgical History:  Procedure Laterality Date  . ANTERIOR CERVICAL CORPECTOMY  12/17/11  . ANTERIOR CERVICAL CORPECTOMY  12/17/2011   Procedure: ANTERIOR CERVICAL CORPECTOMY;  Surgeon: Floyce Stakes, MD;  Location: [facility];  Service: Neurosurgery;  Laterality: N/A;  Anterior Cervical Decompression Fusion Five to Thoracic Two with plating  . BACK SURGERY     lumbar  . CATARACT EXTRACTION W/ INTRAOCULAR LENS  IMPLANT, BILATERAL  ?  2011  . CHOLECYSTECTOMY  2006 "or after"  . CORONARY ANGIOPLASTY WITH STENT PLACEMENT  2012  . CORONARY ARTERY BYPASS GRAFT  2000   CABG X5  . EYE SURGERY     bilateral cataract  . INCISION AND DRAINAGE OF WOUND  ~ 20ll; 12/03/11   "had infection in my right"  . LUMBAR LAMINECTOMY/DECOMPRESSION MICRODISCECTOMY  06/13/2011   Procedure: LUMBAR LAMINECTOMY/DECOMPRESSION MICRODISCECTOMY;  Surgeon: Karn Cassis;  Location: [facility];  Service: Neurosurgery;  Laterality: N/A;  Lumbar three, lumbar four-five Laminectomy  . LUMBAR LAMINECTOMY/DECOMPRESSION MICRODISCECTOMY Right 06/17/2013   Procedure: LUMBAR LAMINECTOMY/DECOMPRESSION MICRODISCECTOMY 1 LEVEL;   Surgeon: Karn Cassis, MD;  Location: [facility];  Service: Neurosurgery;  Laterality: Right;  Right L3-4 Microdiskectomy  . LUMBAR WOUND DEBRIDEMENT N/A 08/02/2013   Procedure: INCISION AND DRAINAGE OF LUMBAR WOUND DEBRIDEMENT;  Surgeon: Karn Cassis, MD;  Location: [facility];  Service: Neurosurgery;  Laterality: N/A;  . PERIPHERALLY INSERTED CENTRAL CATHETER INSERTION  2011 & 11/2011  . SHOULDER ARTHROSCOPY Left 09/13/2013   Procedure: ARTHROSCOPIC IRRIGATION AND DEBRIDEMENT, SYNOVECTOMY ,  LEFT SHOULDER ;  Surgeon: Thera Flake., MD;  Location: [facility];  Service: Orthopedics;  Laterality: Left;  . SHOULDER OPEN ROTATOR CUFF REPAIR  ~ 2011   right  . STERNAL SURG  2000   HAS HAD 5-6 ON HIS STERNUM; "caught MRSA in it"  . TONSILLECTOMY  1949    There were no vitals filed for this visit.   Subjective Assessment - 12/14/19 1126    Subjective  Patient is a 79 y.o. male who presents to physical therapy with c/o impaired gait and balance and weakness. He was doing good up until last thanksgiving 2020 and he fell out of the car. He was doing HHPT in January 2021 and he improved walking and strength. He recently got UTI which has decreased his strength and mobility. He is also having numbness in his hands. He denies any other recent falls. He wears LLE brace due to failed back operation. His main goal for therapy is to use walking and get around as he was before.  He has been wheelchair bound over last several weeks and spends all time in wheelchair or bed. His wife assists him with ADL and transfers.    Pertinent History  hx lumbar surgery, fall 2020    Limitations  Walking;House hold activities;Standing    Patient Stated Goals  therapy is to use walking and get around as he was before, improve strength    Currently in Pain?  Yes    Pain Score  5     Pain Location  Back         [facility] PT Assessment - 12/14/19 0001      Assessment   Medical Diagnosis  Gait, balance, weakness     Referring Provider (PT)  Nita Sells MD    Next MD Visit  June    Prior Therapy  Yes      Precautions   Precautions  None    Precaution Comments  no sternum (from prior infection)    Required Braces or Orthoses  Other Brace/Splint      Restrictions   Weight Bearing Restrictions  No      Balance Screen   Has the patient fallen in the past 6 months  Yes    How many times?  1    Has the patient had a decrease in activity level because of a fear of  falling?   Yes    Is the patient reluctant to leave their home because of a fear of falling?   No      Home Environment   Living Environment  Private residence    Living Arrangements  Spouse/significant other    Available Help at Discharge  Family;Neighbor    Type of Home  House    Home Access  Level entry;Ramped entrance    Home Layout  One level    Home Equipment  Wheelchair - manual;Walker - 4 wheels;Walker - 2 wheels;Tub bench;Grab bars - toilet;Grab bars - tub/shower      Prior Function   Level of Independence  Needs assistance with ADLs    Vocation  Retired      IT consultant   Overall Cognitive Status  Within Functional Limits for tasks assessed      Observation/Other Assessments   Observations  Patient seated in wheelchair with brace on LLE, atrophy throughout UE and LE bilateral    Focus on Therapeutic Outcomes (FOTO)   NA      Sensation   Light Touch  Impaired Detail    Additional Comments  absent light touch L5-S1 on L      ROM / Strength   AROM / PROM / Strength  AROM;Strength      AROM   Overall AROM   Within functional limits for tasks performed    AROM Assessment Site  Hip;Knee;Ankle    Right/Left Hip  Right;Left    Right/Left Knee  Right;Left    Right/Left Ankle  Right;Left      Strength   Strength Assessment Site  Hip;Knee;Ankle    Right/Left Hip  Right;Left    Right Hip Flexion  3+/5    Left Hip Flexion  3+/5    Right/Left Knee  Right;Left    Right Knee Flexion  3+/5    Right Knee Extension  3+/5    Left  Knee Flexion  3-/5    Left Knee Extension  3+/5    Right/Left Ankle  Right;Left    Right Ankle Dorsiflexion  3+/5      Transfers   Transfers  Stand to Sit    Stand to Sit  3: Mod assist;With upper extremity assist;2: Max assist    Stand to Sit Details (indicate cue type and reason)  Manual facilitation for placement;Manual facilitation for weight bearing    Stand to Sit Details  Patient requires mod assist to transition to standing x2 with use of RW, requireing physical assist, verbal cueing for hand placement. Impaired balance and standing tolerance      Ambulation/Gait   Gait Comments  unable secondary to weakness with assist and RW use                  Objective measurements completed on examination: See above findings.      [facility] Adult PT Treatment/Exercise - 12/14/19 0001      Exercises   Exercises  Knee/Hip      Knee/Hip Exercises: Seated   Long Arc Quad  10 reps;1 set;Both    Long [employer] Limitations  10 second holds    Marching  10 reps;Both    Marching Limitations  2 sets              PT Education - 12/14/19 1129    Education Details  Patient educated on exam findings, POC, scope of PT, initial HEP    Person(s) Educated  Patient    Methods  Explanation;Demonstration    Comprehension  Verbalized understanding;Returned demonstration       PT Short Term Goals - 12/14/19 1218      PT SHORT TERM GOAL #1   Title  Patient will be independent with HEP in order to improve functional outcomes.    Time  3    Period  Weeks    Status  New    Target Date  01/04/20      PT SHORT TERM GOAL #2   Title  Patient will report at least 25% improvement in symptoms for improved quality of life.    Time  3    Period  Weeks    Status  New    Target Date  01/04/20        PT Long Term Goals - 12/14/19 1219      PT LONG TERM GOAL #1   Title  Patient will report at least 75% improvement in symptoms for improved quality of life.    Time  6    Period  Weeks     Status  New    Target Date  01/25/20      PT LONG TERM GOAL #2   Title  Patient will be able to ambulate for at least 100 feet in order to demonstrate improved ability to ambulate in the community.    Time  6    Period  Weeks    Status  New    Target Date  01/25/20      PT LONG TERM GOAL #3   Title  Patient will be able to transfer to standing with LRAD with min guard assist or less for improved abilty to transfer to his bed.    Time  6    Period  Weeks    Status  New    Target Date  01/25/20             Plan - 12/14/19 1211    Clinical Impression Statement  Patient is a 79 y.o. male who presents to physical therapy with c/o impaired gait and balance and weakness He presents with deficits in bilateral LE strength, endurance, postural impairments, gait, balance, sensation, motor control, and functional mobility with ADL. He is having to modify and restrict ADL as indicated by subjective information and objective measures which is affecting overall participation. Patient will benefit from skilled physical therapy in order to improve function and reduce impairment. Patient would also benefit from referral to OT services for difficulty with ADL and UE strength.    Personal Factors and Comorbidities  Time since onset of injury/illness/exacerbation;Comorbidity 3+    Comorbidities  chronic weakness, infections, anxiety, chronic low back pain, chronic limited LLE use    Examination-Activity Limitations  Bathing;Bed Mobility;Bend;Caring for Others;Carry;Dressing;Hygiene/Grooming;Lift;Locomotion Level;Self Feeding;Sit;Sleep;Squat;Stairs;Stand;Toileting    Examination-Participation Restrictions  Cleaning;Community Activity;Driving;Laundry;Meal Prep;Shop;Volunteer;Yard Work    Conservation officer, historic buildingstability/Clinical Decision Making  Evolving/Moderate complexity    Clinical Decision Making  Moderate    Rehab Potential  Fair    PT Frequency  3x / week    PT Duration  6 weeks    PT Treatment/Interventions   ADLs/Self Care Home Management;Aquatic Therapy;Canalith Repostioning;Cryotherapy;Electrical Stimulation;Traction;Moist Heat;Iontophoresis 4mg /ml Dexamethasone;Ultrasound;Parrafin;Fluidtherapy;Contrast Bath;DME Instruction;Gait training;Stair training;Functional mobility training;Therapeutic activities;Therapeutic exercise;Balance training;Neuromuscular re-education;Patient/family education;Orthotic Fit/Training;Manual techniques;Wheelchair mobility training;Manual lymph drainage;Compression bandaging;Scar mobilization;Passive range of motion;Energy conservation;Splinting;Dry needling;Taping;Vasopneumatic Device;Vestibular;Spinal Manipulations;Joint Manipulations    PT Next Visit Plan  assess response to initial HEP, continue LE strengtheing as able, begin gait and balance training as able    PT  Home Exercise Plan  5/19 LAQ, marching in seated    Recommended Other Services  OT    Consulted and Agree with Plan of Care  Patient       Patient will benefit from skilled therapeutic intervention in order to improve the following deficits and impairments:  Abnormal gait, Decreased balance, Difficulty walking, Impaired tone, Decreased activity tolerance, Decreased strength, Postural dysfunction, Impaired sensation, Decreased mobility, Decreased endurance, Decreased knowledge of use of DME, Impaired UE functional use, Improper body mechanics, Pain, Impaired perceived functional ability  Visit Diagnosis: Muscle weakness (generalized)  Other abnormalities of gait and mobility  Other symptoms and signs involving the musculoskeletal system     Problem List Patient Active Problem List   Diagnosis Date Noted  . Hyperglycemia, unspecified   . Cauda equina syndrome (H[facility])   . Low back pain   . Atherosclerotic heart disease of native coronary artery without angina pectoris   . Essential (primary) hypertension   . Gastro-esophageal reflux disease without esophagitis   . Hyperlipidemia, unspecified   .  Secondary osteoarthritis, unspecified site   . Anxiety disorder, unspecified   . Chronic diastolic (congestive) heart failure ([facility])   . Chronic kidney disease, stage 1   . Diarrhea, unspecified   . Epistaxis   . Inflammatory disease of prostate, unspecified   . Localized edema   . Other cervical disc degeneration, unspecified cervical region   . Retention of urine, unspecified   . Unspecified abnormalities of gait and mobility   . Unspecified atrial fibrillation ([facility])   . Persistent atrial fibrillation (H[facility]) 05/30/2019  . 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 Infection of urinary tract 10/26/2015  . Intractable back pain 10/22/2015  . Back pain 10/22/2015  . Muscle weakness (generalized) 12/05/2013  . Tight fascia 12/05/2013  . Decreased range of motion of right shoulder 12/05/2013  . Decreased range of motion of left shoulder 12/05/2013  . Bilateral leg weakness 11/21/2013  . Poor balance 11/21/2013  . Difficulty in walking(719.7) 11/21/2013  . Shoulder pain, left 09/12/2013  . Anemia, iron deficiency 09/09/2013  . Dyspnea 09/07/2013  . UTI (urinary tract infection) 09/07/2013  . Acute CHF (congestive heart failure) (HCC) 09/07/2013  . Elevated troponin 09/07/2013  . Fever and chills 09/06/2013  . Altered mental status 09/05/2013  . Neurogenic bowel 08/22/2013  . 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 Gout flare 08/22/2013  . Postoperative wound infection 08/22/2013  . Lumbar degenerative disc disease 08/02/2013  . Post-operative pain 07/29/2013  . Osteonecrosis ([facility]) 05/05/2013  . Small bowel obstruction ([facility]) 04/17/2013  . Acute renal failure ([facility]) 04/17/2013  . Medial meniscus, posterior horn derangement 02/17/2013  . Knee sprain and strain 02/17/2013  . Trigger point with neck pain 03/18/2012  . Rotator cuff tear arthropathy of right shoulder 09/08/2011  . CAD in native artery 01/09/2011  . Ankle fracture 12/25/2010  .  Contusion of shoulder, left 12/25/2010  . PEMPHIGUS VULGARIS 07/02/2010  . MRSA 05/13/2010  . PRURITUS 05/13/2010  . SPINAL STENOSIS, LUMBAR 05/13/2010  . HLD (hyperlipidemia) 04/24/2010  . GASTROESOPHAGEAL REFLUX DISEASE 04/24/2010  . VENTRAL HERNIA, INCISIONAL 04/24/2010  . DEGENERATIVE JOINT DISEASE 04/24/2010  . PYOGENIC ARTHRITIS, SHOULDER REGION 02/04/2010  . Essential hypertension 01/01/2010  . RUPTURE ROTATOR CUFF 02/19/2009    12:22 PM, 12/14/19 Wyman Songster PT, DPT Physical Therapist at [facility]  [facility] [address] [place], [place], [address] Phone:  [phone]   Fax:  [phone]  Name: JAMAIR CATO MRN: [id number] Date of Birth: 06/18/1941

## 2019-12-16 ENCOUNTER — Ambulatory Visit (HOSPITAL_COMMUNITY): Payer: Medicare Other | Admitting: Physical Therapy

## 2019-12-16 ENCOUNTER — Telehealth (HOSPITAL_COMMUNITY): Payer: Self-pay | Admitting: Physical Therapy

## 2019-12-16 NOTE — Telephone Encounter (Signed)
pt called to cx today's appt no reason given on vm.

## 2019-12-18 ENCOUNTER — Observation Stay (HOSPITAL_COMMUNITY): Payer: Medicare Other

## 2019-12-18 ENCOUNTER — Encounter (HOSPITAL_COMMUNITY): Payer: Self-pay

## 2019-12-18 ENCOUNTER — Inpatient Hospital Stay (HOSPITAL_COMMUNITY)
Admission: EM | Admit: 2019-12-18 | Discharge: 2019-12-20 | DRG: 641 | Disposition: A | Discharge status: Hospice | Payer: Medicare Other | Attending: Internal Medicine | Admitting: Internal Medicine

## 2019-12-18 ENCOUNTER — Other Ambulatory Visit: Payer: Self-pay

## 2019-12-18 DIAGNOSIS — Z7189 Other specified counseling: Secondary | ICD-10-CM

## 2019-12-18 DIAGNOSIS — I10 Essential (primary) hypertension: Secondary | ICD-10-CM

## 2019-12-18 DIAGNOSIS — I4819 Other persistent atrial fibrillation: Secondary | ICD-10-CM

## 2019-12-18 DIAGNOSIS — I5032 Chronic diastolic (congestive) heart failure: Secondary | ICD-10-CM | POA: Diagnosis not present

## 2019-12-18 DIAGNOSIS — E8809 Other disorders of plasma-protein metabolism, not elsewhere classified: Secondary | ICD-10-CM | POA: Diagnosis present

## 2019-12-18 DIAGNOSIS — S343XXA Injury of cauda equina, initial encounter: Secondary | ICD-10-CM | POA: Diagnosis present

## 2019-12-18 DIAGNOSIS — Z960 Presence of urogenital implants: Secondary | ICD-10-CM | POA: Diagnosis present

## 2019-12-18 DIAGNOSIS — N1831 Chronic kidney disease, stage 3a: Secondary | ICD-10-CM | POA: Diagnosis present

## 2019-12-18 DIAGNOSIS — Z888 Allergy status to other drugs, medicaments and biological substances status: Secondary | ICD-10-CM

## 2019-12-18 DIAGNOSIS — F419 Anxiety disorder, unspecified: Secondary | ICD-10-CM | POA: Diagnosis present

## 2019-12-18 DIAGNOSIS — R0600 Dyspnea, unspecified: Secondary | ICD-10-CM

## 2019-12-18 DIAGNOSIS — I130 Hypertensive heart and chronic kidney disease with heart failure and stage 1 through stage 4 chronic kidney disease, or unspecified chronic kidney disease: Secondary | ICD-10-CM | POA: Diagnosis not present

## 2019-12-18 DIAGNOSIS — R627 Adult failure to thrive: Secondary | ICD-10-CM | POA: Diagnosis present

## 2019-12-18 DIAGNOSIS — Z8614 Personal history of Methicillin resistant Staphylococcus aureus infection: Secondary | ICD-10-CM

## 2019-12-18 DIAGNOSIS — E782 Mixed hyperlipidemia: Secondary | ICD-10-CM

## 2019-12-18 DIAGNOSIS — Z885 Allergy status to narcotic agent status: Secondary | ICD-10-CM

## 2019-12-18 DIAGNOSIS — N390 Urinary tract infection, site not specified: Secondary | ICD-10-CM | POA: Diagnosis not present

## 2019-12-18 DIAGNOSIS — Z8249 Family history of ischemic heart disease and other diseases of the circulatory system: Secondary | ICD-10-CM

## 2019-12-18 DIAGNOSIS — E860 Dehydration: Secondary | ICD-10-CM | POA: Diagnosis not present

## 2019-12-18 DIAGNOSIS — Z681 Body mass index (BMI) 19 or less, adult: Secondary | ICD-10-CM | POA: Diagnosis not present

## 2019-12-18 DIAGNOSIS — Z7401 Bed confinement status: Secondary | ICD-10-CM

## 2019-12-18 DIAGNOSIS — R54 Age-related physical debility: Secondary | ICD-10-CM | POA: Diagnosis present

## 2019-12-18 DIAGNOSIS — R339 Retention of urine, unspecified: Secondary | ICD-10-CM | POA: Diagnosis present

## 2019-12-18 DIAGNOSIS — R5381 Other malaise: Secondary | ICD-10-CM | POA: Diagnosis not present

## 2019-12-18 DIAGNOSIS — R531 Weakness: Secondary | ICD-10-CM | POA: Diagnosis not present

## 2019-12-18 DIAGNOSIS — Z7952 Long term (current) use of systemic steroids: Secondary | ICD-10-CM

## 2019-12-18 DIAGNOSIS — Z515 Encounter for palliative care: Secondary | ICD-10-CM

## 2019-12-18 DIAGNOSIS — Z66 Do not resuscitate: Secondary | ICD-10-CM | POA: Diagnosis not present

## 2019-12-18 DIAGNOSIS — F329 Major depressive disorder, single episode, unspecified: Secondary | ICD-10-CM | POA: Diagnosis present

## 2019-12-18 DIAGNOSIS — Z87440 Personal history of urinary (tract) infections: Secondary | ICD-10-CM

## 2019-12-18 DIAGNOSIS — G8220 Paraplegia, unspecified: Secondary | ICD-10-CM | POA: Diagnosis present

## 2019-12-18 DIAGNOSIS — S343XXS Injury of cauda equina, sequela: Secondary | ICD-10-CM

## 2019-12-18 DIAGNOSIS — Z20822 Contact with and (suspected) exposure to covid-19: Secondary | ICD-10-CM | POA: Diagnosis present

## 2019-12-18 DIAGNOSIS — E785 Hyperlipidemia, unspecified: Secondary | ICD-10-CM | POA: Diagnosis present

## 2019-12-18 DIAGNOSIS — K219 Gastro-esophageal reflux disease without esophagitis: Secondary | ICD-10-CM | POA: Diagnosis present

## 2019-12-18 DIAGNOSIS — I2510 Atherosclerotic heart disease of native coronary artery without angina pectoris: Secondary | ICD-10-CM | POA: Diagnosis present

## 2019-12-18 DIAGNOSIS — Z951 Presence of aortocoronary bypass graft: Secondary | ICD-10-CM

## 2019-12-18 DIAGNOSIS — Z7901 Long term (current) use of anticoagulants: Secondary | ICD-10-CM

## 2019-12-18 DIAGNOSIS — Z87891 Personal history of nicotine dependence: Secondary | ICD-10-CM

## 2019-12-18 DIAGNOSIS — Z79899 Other long term (current) drug therapy: Secondary | ICD-10-CM

## 2019-12-18 DIAGNOSIS — G834 Cauda equina syndrome: Secondary | ICD-10-CM | POA: Diagnosis present

## 2019-12-18 LAB — URINALYSIS, ROUTINE W REFLEX MICROSCOPIC
Bacteria, UA: NONE SEEN
Bilirubin Urine: NEGATIVE
Glucose, UA: NEGATIVE mg/dL
Ketones, ur: NEGATIVE mg/dL
Nitrite: POSITIVE — AB
Protein, ur: NEGATIVE mg/dL
Specific Gravity, Urine: 1.008 (ref 1.005–1.030)
WBC, UA: >50 WBC/hpf — ABNORMAL HIGH (ref 0–5)
pH: 5 (ref 5.0–8.0)

## 2019-12-18 LAB — FOLATE: Folate: 13.5 ng/mL (ref >5.9)

## 2019-12-18 LAB — CBC WITH DIFFERENTIAL/PLATELET
Abs Immature Granulocytes: 0.04 10*3/uL (ref 0.00–0.07)
Basophils Absolute: 0 10*3/uL (ref 0.0–0.1)
Basophils Relative: 0 %
Eosinophils Absolute: 0.1 10*3/uL (ref 0.0–0.5)
Eosinophils Relative: 1 %
HCT: 28.1 % — ABNORMAL LOW (ref 39.0–52.0)
Hemoglobin: 8.4 g/dL — ABNORMAL LOW (ref 13.0–17.0)
Immature Granulocytes: 1 %
Lymphocytes Relative: 9 %
Lymphs Abs: 0.6 10*3/uL — ABNORMAL LOW (ref 0.7–4.0)
MCH: 25.5 pg — ABNORMAL LOW (ref 26.0–34.0)
MCHC: 29.9 g/dL — ABNORMAL LOW (ref 30.0–36.0)
MCV: 85.2 fL (ref 80.0–100.0)
Monocytes Absolute: 0.5 10*3/uL (ref 0.1–1.0)
Monocytes Relative: 8 %
Neutro Abs: 5.7 10*3/uL (ref 1.7–7.7)
Neutrophils Relative %: 81 %
Platelets: 247 10*3/uL (ref 150–400)
RBC: 3.3 MIL/uL — ABNORMAL LOW (ref 4.22–5.81)
RDW: 18.1 % — ABNORMAL HIGH (ref 11.5–15.5)
WBC: 7 10*3/uL (ref 4.0–10.5)
nRBC: 0 % (ref 0.0–0.2)

## 2019-12-18 LAB — COMPREHENSIVE METABOLIC PANEL
ALT: 10 U/L (ref 0–44)
AST: 12 U/L — ABNORMAL LOW (ref 15–41)
Albumin: 3.2 g/dL — ABNORMAL LOW (ref 3.5–5.0)
Alkaline Phosphatase: 56 U/L (ref 38–126)
Anion gap: 9 (ref 5–15)
BUN: 31 mg/dL — ABNORMAL HIGH (ref 8–23)
CO2: 25 mmol/L (ref 22–32)
Calcium: 8.8 mg/dL — ABNORMAL LOW (ref 8.9–10.3)
Chloride: 103 mmol/L (ref 98–111)
Creatinine, Ser: 1.34 mg/dL — ABNORMAL HIGH (ref 0.61–1.24)
GFR calc Af Amer: 59 mL/min — ABNORMAL LOW (ref >60)
GFR calc non Af Amer: 51 mL/min — ABNORMAL LOW (ref >60)
Glucose, Bld: 105 mg/dL — ABNORMAL HIGH (ref 70–99)
Potassium: 4.3 mmol/L (ref 3.5–5.1)
Sodium: 137 mmol/L (ref 135–145)
Total Bilirubin: 0.7 mg/dL (ref 0.3–1.2)
Total Protein: 6 g/dL — ABNORMAL LOW (ref 6.5–8.1)

## 2019-12-18 LAB — LACTIC ACID, PLASMA
Lactic Acid, Venous: 0.9 mmol/L (ref 0.5–1.9)
Lactic Acid, Venous: 1 mmol/L (ref 0.5–1.9)

## 2019-12-18 LAB — SARS CORONAVIRUS 2 BY RT PCR (HOSPITAL ORDER, PERFORMED IN ~~LOC~~ HOSPITAL LAB): SARS Coronavirus 2: NEGATIVE

## 2019-12-18 LAB — CK: Total CK: 28 U/L — ABNORMAL LOW (ref 49–397)

## 2019-12-18 LAB — VITAMIN B12: Vitamin B-12: 134 pg/mL — ABNORMAL LOW (ref 180–914)

## 2019-12-18 LAB — TSH: TSH: 1.595 u[IU]/mL (ref 0.350–4.500)

## 2019-12-18 MED ORDER — GABAPENTIN 100 MG PO CAPS
100.0000 mg | ORAL_CAPSULE | Freq: Three times a day (TID) | ORAL | Status: DC
  Administered 2019-12-18 – 2019-12-19 (×4): 100 mg via ORAL
  Filled 2019-12-18 (×4): qty 1

## 2019-12-18 MED ORDER — HYDROCODONE-ACETAMINOPHEN 5-325 MG PO TABS
1.0000 | ORAL_TABLET | Freq: Four times a day (QID) | ORAL | Status: DC | PRN
  Administered 2019-12-18: 2 via ORAL
  Filled 2019-12-18: qty 2

## 2019-12-18 MED ORDER — ADULT MULTIVITAMIN W/MINERALS CH
1.0000 | ORAL_TABLET | Freq: Every day | ORAL | Status: DC
  Administered 2019-12-18 – 2019-12-19 (×2): 1 via ORAL
  Filled 2019-12-18 (×7): qty 1

## 2019-12-18 MED ORDER — OXYBUTYNIN CHLORIDE ER 5 MG PO TB24
15.0000 mg | ORAL_TABLET | Freq: Every day | ORAL | Status: DC
  Administered 2019-12-18 – 2019-12-20 (×3): 15 mg via ORAL
  Filled 2019-12-18 (×3): qty 3

## 2019-12-18 MED ORDER — FLUCONAZOLE IN SODIUM CHLORIDE 200-0.9 MG/100ML-% IV SOLN
200.0000 mg | INTRAVENOUS | Status: DC
  Administered 2019-12-18: 200 mg via INTRAVENOUS
  Filled 2019-12-18 (×2): qty 100

## 2019-12-18 MED ORDER — ATORVASTATIN CALCIUM 40 MG PO TABS
40.0000 mg | ORAL_TABLET | Freq: Every day | ORAL | Status: DC
  Administered 2019-12-18 – 2019-12-19 (×2): 40 mg via ORAL
  Filled 2019-12-18 (×3): qty 1

## 2019-12-18 MED ORDER — ONDANSETRON HCL 4 MG PO TABS: 4.0000 mg | ORAL_TABLET | Freq: Four times a day (QID) | ORAL | Status: DC | PRN

## 2019-12-18 MED ORDER — SODIUM CHLORIDE 0.9 % IV SOLN: INTRAVENOUS | Status: AC

## 2019-12-18 MED ORDER — ACETAMINOPHEN 325 MG PO TABS
650.0000 mg | ORAL_TABLET | Freq: Four times a day (QID) | ORAL | Status: DC | PRN
  Administered 2019-12-18 – 2019-12-20 (×2): 650 mg via ORAL
  Filled 2019-12-18 (×2): qty 2

## 2019-12-18 MED ORDER — BACLOFEN 10 MG PO TABS
10.0000 mg | ORAL_TABLET | Freq: Three times a day (TID) | ORAL | Status: DC
  Administered 2019-12-18 – 2019-12-19 (×4): 10 mg via ORAL
  Filled 2019-12-18 (×4): qty 1

## 2019-12-18 MED ORDER — ONDANSETRON HCL 4 MG/2ML IJ SOLN
4.0000 mg | Freq: Four times a day (QID) | INTRAMUSCULAR | Status: DC | PRN
  Administered 2019-12-19: 4 mg via INTRAVENOUS

## 2019-12-18 MED ORDER — FINASTERIDE 5 MG PO TABS
5.0000 mg | ORAL_TABLET | Freq: Every day | ORAL | Status: DC
  Administered 2019-12-18 – 2019-12-20 (×3): 5 mg via ORAL
  Filled 2019-12-18 (×3): qty 1

## 2019-12-18 MED ORDER — PREDNISONE 10 MG PO TABS
5.0000 mg | ORAL_TABLET | Freq: Every day | ORAL | Status: DC
  Administered 2019-12-19: 10 mg via ORAL
  Filled 2019-12-18: qty 1

## 2019-12-18 MED ORDER — SODIUM CHLORIDE 0.9 % IV BOLUS
500.0000 mL | Freq: Once | INTRAVENOUS | Status: AC
  Administered 2019-12-18: 500 mL via INTRAVENOUS

## 2019-12-18 MED ORDER — FENTANYL CITRATE (PF) 100 MCG/2ML IJ SOLN
50.0000 ug | Freq: Once | INTRAMUSCULAR | Status: AC
  Administered 2019-12-18: 50 ug via INTRAVENOUS
  Filled 2019-12-18: qty 2

## 2019-12-18 MED ORDER — RIVAROXABAN 20 MG PO TABS
20.0000 mg | ORAL_TABLET | Freq: Every day | ORAL | Status: DC
  Administered 2019-12-18: 20 mg via ORAL
  Filled 2019-12-18: qty 1

## 2019-12-18 MED ORDER — DOXAZOSIN MESYLATE 2 MG PO TABS
8.0000 mg | ORAL_TABLET | Freq: Every day | ORAL | Status: DC
  Administered 2019-12-18 – 2019-12-19 (×2): 8 mg via ORAL
  Filled 2019-12-18 (×2): qty 4

## 2019-12-18 MED ORDER — SENNA 8.6 MG PO TABS
2.0000 | ORAL_TABLET | Freq: Every day | ORAL | Status: DC
  Administered 2019-12-18 – 2019-12-20 (×3): 17.2 mg via ORAL
  Filled 2019-12-18 (×3): qty 2

## 2019-12-18 MED ORDER — TAMSULOSIN HCL 0.4 MG PO CAPS
0.4000 mg | ORAL_CAPSULE | Freq: Every day | ORAL | Status: DC
  Administered 2019-12-19 – 2019-12-20 (×2): 0.4 mg via ORAL
  Filled 2019-12-18 (×2): qty 1

## 2019-12-18 MED ORDER — GENTAMICIN SULFATE 40 MG/ML IJ SOLN
300.0000 mg | INTRAVENOUS | Status: DC
  Administered 2019-12-18: 300 mg via INTRAVENOUS
  Filled 2019-12-18 (×2): qty 7.5

## 2019-12-18 MED ORDER — FUROSEMIDE 20 MG PO TABS
20.0000 mg | ORAL_TABLET | Freq: Every day | ORAL | Status: DC
  Administered 2019-12-18 – 2019-12-20 (×3): 20 mg via ORAL
  Filled 2019-12-18 (×3): qty 1

## 2019-12-18 MED ORDER — FENTANYL CITRATE (PF) 100 MCG/2ML IJ SOLN
50.0000 ug | INTRAMUSCULAR | Status: AC | PRN
  Administered 2019-12-18 (×2): 50 ug via INTRAVENOUS
  Filled 2019-12-18 (×2): qty 2

## 2019-12-18 MED ORDER — ACETAMINOPHEN 650 MG RE SUPP: 650.0000 mg | Freq: Four times a day (QID) | RECTAL | Status: DC | PRN

## 2019-12-18 MED ORDER — POLYETHYLENE GLYCOL 3350 17 G PO PACK
17.0000 g | PACK | Freq: Every day | ORAL | Status: DC
  Administered 2019-12-18 – 2019-12-19 (×2): 17 g via ORAL
  Filled 2019-12-18 (×3): qty 1

## 2019-12-18 MED ORDER — HEPARIN SODIUM (PORCINE) 5000 UNIT/ML IJ SOLN
5000.0000 [IU] | Freq: Three times a day (TID) | INTRAMUSCULAR | Status: DC
  Administered 2019-12-18 – 2019-12-19 (×3): 5000 [IU] via SUBCUTANEOUS
  Filled 2019-12-18 (×3): qty 1

## 2019-12-18 MED ORDER — ALPRAZOLAM 0.25 MG PO TABS: 0.2500 mg | ORAL_TABLET | Freq: Two times a day (BID) | ORAL | Status: DC | PRN

## 2019-12-18 MED ORDER — OXYCODONE HCL 5 MG PO TABS
5.0000 mg | ORAL_TABLET | Freq: Four times a day (QID) | ORAL | Status: DC | PRN
  Administered 2019-12-18 – 2019-12-19 (×3): 10 mg via ORAL
  Filled 2019-12-18 (×3): qty 2

## 2019-12-18 NOTE — ED Notes (Signed)
Call Pam [phone] with any updates

## 2019-12-18 NOTE — Progress Notes (Signed)
Pharmacy Antibiotic Note  [name] is a 79 y.o. male admitted on 12/18/2019 with UTI.  Pharmacy has been consulted for fluconazole and gentamicin dosing.  Plan: 1.  fluconazole 200mg  IV q24h 2.   gentamicin 300mg  IV q24h  3.    Random gent  level at 0300 on 12/19/19 (~12-hr level) using 5mg /kg nomogram Pharmacy will continue to monitor renal function, gentamicin levels as clinically appropriate,  cultures and patient progress.    Height: 6' (182.9 cm) Weight: 63.5 kg (140 lb) IBW/kg (Calculated) : 77.6  Temp (24hrs), Avg:97.6 F (36.4 C), Min:97.6 F (36.4 C), Max:97.6 F (36.4 C)  Recent Labs  Lab 12/18/19 1026 12/18/19 1222  WBC 7.0  --   CREATININE 1.34*  --   LATICACIDVEN 0.9 1.0    Estimated Creatinine Clearance: 42.1 mL/min (A) (by C-G formula based on SCr of 1.34 mg/dL (H)).    Allergies  Allergen Reactions  . Morphine Other (See Comments)    REACTION: made him go crazy; "I had fun w/it; my family didn't think it was too funny"  . Metoprolol Other (See Comments)    REACTION:  Tachycardia; "don't remember how bad it was; it was so long ago"  . Rifampin     Itch     Antimicrobials this admission: gentamicin 5/23 >>  fluconazole 5/23>>     Microbiology results: 5/23 [facility] x2:  5/23 UCx:   5/23 Resp PCR: SARS CoV-2 negative; Flu A/B negative   Thank you for allowing pharmacy to be a part of this patient's care.  [facility] 12/18/2019 1:20 PM

## 2019-12-18 NOTE — ED Provider Notes (Signed)
[facility] EMERGENCY DEPARTMENT Provider Note   CSN: [id number] Arrival date & time: 12/18/19  0754     History Chief Complaint  Patient presents with  . Weakness    [name] is a 79 y.o. 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  Patient states that he has had issues with urinary tract infections for the past several months, he has some improvement following antibiotics, but this seemed to worsen again 2 weeks ago.  He states that he is having dysuria, drainage at his catheter site, intermittent lower abdominal pain, general malaise, chills, and generalized weakness.  He states that his generalized weakness has acutely worsened to the point that he cannot pick up a spoon or fork.  No alleviating or aggravating factors to his symptoms.  He is concerned that he needs IV antibiotics.  He is followed by urologist Dr. Ouida Sills with [facility].  He denies fever, vomiting, flank pain, or testicular pain, however he does have decreased sensation in this area.   HPI     Past Medical History:  Diagnosis Date  . Acute kidney failure, unspecified ([name])   . Acute renal failure ([name]) 04/17/2013  . Anemia, iron deficiency 09/09/2013  . Anxiety   . Anxiety disorder, unspecified   . Arthritis   . Atherosclerotic heart disease of native coronary artery without angina pectoris   . Atrial fibrillation ([place])   . 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 Cauda equina syndrome ([name])   . Chronic diastolic (congestive) heart failure ([name])   . Chronic kidney disease, stage 1   . Coronary artery disease    a. s/p CABG in 2000 with low-risk NST in 01/2017    . Diarrhea, unspecified   . Disorder of blood    BEEN TREATED BY DERMATOLOGIST X 4 YRS..."BLOOD BLISTERS"  . Epistaxis   . Essential (primary) hypertension   . Gastro-esophageal reflux disease without esophagitis   . Gout   . Gout, unspecified   . HTN (hypertension)    sees Dr. [name] in [place]  . Hx MRSA infection    rt shoulder  . Hyperglycemia, unspecified   . Hyperlipidemia   . Hyperlipidemia, unspecified   . Inflammatory disease of prostate, unspecified   . Iron deficiency anemia, unspecified   . Localized edema   . Low back pain   . Osteonecrosis, unspecified ([place])   . Other cervical disc degeneration, unspecified cervical region   . Personal history of Methicillin resistant Staphylococcus aureus infection   . Pneumonia    "I've had it 3-4 times"  . Pneumonia, unspecified organism   . Retention of urine, unspecified   . Secondary osteoarthritis, unspecified site   . Small bowel obstruction ([name])   . Small bowel problem    HAD ALOT OF SCAR TISSUE FROM PREVIOUS SURGERIES..NG WAS INSERTED ...Marland KitchenMarland KitchenNO SURGERY NEEDED...Marland KitchenMarland KitchenIN FOR 8 DAYS  . Unspecified abnormalities of gait and mobility   . Unspecified atrial fibrillation ([name])   . Ventral hernia   . Ventral hernia without obstruction or gangrene     Patient Active Problem List   Diagnosis Date Noted  . Hyperglycemia, unspecified   . Cauda equina syndrome ([name])   . Low back  pain   . Atherosclerotic heart disease of native coronary artery without angina pectoris   . Essential (primary) hypertension   . Gastro-esophageal reflux disease without esophagitis   . Hyperlipidemia, unspecified   . Secondary osteoarthritis, unspecified site   . Anxiety disorder, unspecified   . Chronic diastolic (congestive) heart failure (HCC)   . Chronic kidney disease, stage 1   . Diarrhea, unspecified   . Epistaxis   . Inflammatory disease of prostate, unspecified   . Localized edema   . Other cervical disc degeneration, unspecified  cervical region   . Retention of urine, unspecified   . Unspecified abnormalities of gait and mobility   . Unspecified atrial fibrillation (HCC)   . Persistent atrial fibrillation (HCC) 05/30/2019  . 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 Infection of urinary tract 10/26/2015  . Intractable back pain 10/22/2015  . Back pain 10/22/2015  . Muscle weakness (generalized) 12/05/2013  . Tight fascia 12/05/2013  . Decreased range of motion of right shoulder 12/05/2013  . Decreased range of motion of left shoulder 12/05/2013  . Bilateral leg weakness 11/21/2013  . Poor balance 11/21/2013  . Difficulty in walking(719.7) 11/21/2013  . Shoulder pain, left 09/12/2013  . Anemia, iron deficiency 09/09/2013  . Dyspnea 09/07/2013  . UTI (urinary tract infection) 09/07/2013  . Acute CHF (congestive heart failure) (HCC) 09/07/2013  . Elevated troponin 09/07/2013  . Fever and chills 09/06/2013  . Altered mental status 09/05/2013  . Neurogenic bowel 08/22/2013  . 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 Gout flare 08/22/2013  . Postoperative wound infection 08/22/2013  . Lumbar degenerative disc disease 08/02/2013  . Post-operative pain 07/29/2013  . Osteonecrosis (HCC) 05/05/2013  . Small bowel obstruction (HCC) 04/17/2013  . Acute renal failure (HCC) 04/17/2013  . Medial meniscus, posterior horn derangement 02/17/2013  . Knee sprain and strain 02/17/2013  . Trigger point with neck pain 03/18/2012  . Rotator cuff tear arthropathy of right shoulder 09/08/2011  . CAD in native artery 01/09/2011  . Ankle fracture 12/25/2010  . Contusion of shoulder, left 12/25/2010  . PEMPHIGUS VULGARIS 07/02/2010  . MRSA 05/13/2010  . PRURITUS 05/13/2010  . SPINAL STENOSIS, LUMBAR 05/13/2010  . HLD (hyperlipidemia) 04/24/2010  . GASTROESOPHAGEAL REFLUX DISEASE 04/24/2010  . VENTRAL HERNIA, INCISIONAL 04/24/2010  . DEGENERATIVE JOINT DISEASE 04/24/2010  . PYOGENIC  ARTHRITIS, SHOULDER REGION 02/04/2010  . Essential hypertension 01/01/2010  . RUPTURE ROTATOR CUFF 02/19/2009    Past Surgical History:  Procedure Laterality Date  . ANTERIOR CERVICAL CORPECTOMY  12/17/11  . ANTERIOR CERVICAL CORPECTOMY  12/17/2011   Procedure: ANTERIOR CERVICAL CORPECTOMY;  Surgeon: Karn Cassis, MD;  Location: [facility];  Service: Neurosurgery;  Laterality: N/A;  Anterior Cervical Decompression Fusion Five to Thoracic Two with plating  . BACK SURGERY     lumbar  . CATARACT EXTRACTION W/ INTRAOCULAR LENS  IMPLANT, BILATERAL  ? 2011  . CHOLECYSTECTOMY  2006 "or after"  . CORONARY ANGIOPLASTY WITH STENT PLACEMENT  2012  . CORONARY ARTERY BYPASS GRAFT  2000   CABG X5  . EYE SURGERY     bilateral cataract  . INCISION AND DRAINAGE OF WOUND  ~ 20ll; 12/03/11   "had infection in my right"  . LUMBAR LAMINECTOMY/DECOMPRESSION MICRODISCECTOMY  06/13/2011   Procedure: LUMBAR LAMINECTOMY/DECOMPRESSION MICRODISCECTOMY;  Surgeon: Karn Cassis;  Location: [facility];  Service: Neurosurgery;  Laterality: N/A;  Lumbar three, lumbar four-five Laminectomy  . LUMBAR LAMINECTOMY/DECOMPRESSION MICRODISCECTOMY Right 06/17/2013  Procedure: LUMBAR LAMINECTOMY/DECOMPRESSION MICRODISCECTOMY 1 LEVEL;  Surgeon: Karn Cassis, MD;  Location: [facility];  Service: Neurosurgery;  Laterality: Right;  Right L3-4 Microdiskectomy  . LUMBAR WOUND DEBRIDEMENT N/A 08/02/2013   Procedure: INCISION AND DRAINAGE OF LUMBAR WOUND DEBRIDEMENT;  Surgeon: Karn Cassis, MD;  Location: [facility];  Service: Neurosurgery;  Laterality: N/A;  . PERIPHERALLY INSERTED CENTRAL CATHETER INSERTION  2011 & 11/2011  . SHOULDER ARTHROSCOPY Left 09/13/2013   Procedure: ARTHROSCOPIC IRRIGATION AND DEBRIDEMENT, SYNOVECTOMY ,  LEFT SHOULDER ;  Surgeon: Thera Flake., MD;  Location: [facility] OR;  Service: Orthopedics;  Laterality: Left;  . SHOULDER OPEN ROTATOR CUFF REPAIR  ~ 2011   right  . STERNAL SURG  2000   HAS  HAD 5-6 ON HIS STERNUM; "caught MRSA in it"  . TONSILLECTOMY  1949       Family History  Problem Relation Age of Onset  . Heart disease Other   . Hypertension Mother   . Hypertension Maternal Aunt   . Hypertension Maternal Uncle   . Anesthesia problems Neg Hx   . Hypotension Neg Hx   . Malignant hyperthermia Neg Hx   . Pseudochol deficiency Neg Hx     Social History   Tobacco Use  . Smoking status: Former Smoker    Packs/day: 1.00    Years: 1.00    Pack years: 1.00    Types: Cigarettes    Start date: 12/29/1959    Quit date: 12/27/1998    Years since quitting: 20.9  . Smokeless tobacco: Never Used  Substance Use Topics  . Alcohol use: No    Alcohol/week: 0.0 standard drinks  . Drug use: No    Home Medications Prior to Admission medications   Medication Sig Start Date End Date Taking? Authorizing Provider  acetaminophen (TYLENOL) 325 MG tablet Take 1-2 tablets (325-650 mg total) by mouth every 4 (four) hours as needed for mild pain. 08/19/13   Love, Evlyn Kanner, PA-C  ALPRAZolam Prudy Feeler) 0.5 MG tablet Take 0.25-0.5 mg by mouth 2 (two) times daily as needed for anxiety.    [provider]  atorvastatin (LIPITOR) 40 MG tablet TAKE 1 TABLET BY MOUTH EVERY DAY 09/03/15   Laqueta Linden, MD  cephALEXin (KEFLEX) 250 MG capsule Take 1 capsule (250 mg total) by mouth 4 (four) times daily. 11/25/19   Terrilee Files, MD  docusate sodium (COLACE) 100 MG capsule Take 100 mg by mouth daily.    [provider]  doxazosin (CARDURA) 8 MG tablet Take 8 mg by mouth daily.    [provider]  finasteride (PROSCAR) 5 MG tablet Take 5 mg by mouth daily.    [provider]  furosemide (LASIX) 20 MG tablet Take 1 tablet (20 mg total) by mouth daily. 05/31/19 08/29/19  Dyann Kief, PA-C  HYDROcodone-acetaminophen (NORCO/VICODIN) 5-325 MG tablet Take 1-2 tablets by mouth every 6 (six) hours as needed. 06/20/19   Maxwell Caul, PA-C  Multiple Vitamin  (MULTIVITAMIN) tablet Take 1 tablet by mouth daily.    [provider]  nitroGLYCERIN (NITROSTAT) 0.4 MG SL tablet Place 0.4 mg under the tongue every 5 (five) minutes as needed for chest pain. May take up to 3 doses per episode. Contact MD or 911 if unresolved chest pain continues. 01/09/11   Jodelle Gross, NP  predniSONE (DELTASONE) 10 MG tablet Take 5-10 mg by mouth as directed. 01/06/18   [provider]  rivaroxaban (XARELTO) 20 MG  TABS tablet Take 1 tablet (20 mg total) by mouth daily with supper. 08/23/19   Laqueta LindenKoneswaran, Suresh A, MD  tamsulosin (FLOMAX) 0.4 MG CAPS capsule Take 0.4 mg by mouth.    [provider]  traMADol (ULTRAM) 50 MG tablet Take 50 mg by mouth every 6 (six) hours as needed.    [provider]    Allergies    Morphine, Metoprolol, and Rifampin  Review of Systems   Review of Systems  Constitutional: Positive for chills and fatigue. Negative for fever.  Respiratory: Negative for cough and shortness of breath.   Cardiovascular: Negative for chest pain.  Gastrointestinal: Positive for abdominal pain. Negative for vomiting.  Genitourinary: Positive for discharge and dysuria. Negative for testicular pain.  Neurological: Positive for weakness (Generalized).  All other systems reviewed and are negative.  Physical Exam Updated Vital Signs Pulse 67   Temp 97.6 F (36.4 C) (Oral)   Resp 17   Ht 6' (1.829 m)   Wt 63.5 kg   SpO2 100%   BMI 18.99 kg/m   Physical Exam Vitals and nursing note reviewed. Exam conducted with a chaperone present.  Constitutional:      General: He is not in acute distress.    Appearance: He is well-developed. He is not toxic-appearing.  HENT:     Head: Normocephalic and atraumatic.  Eyes:     General:        Right eye: No discharge.        Left eye: No discharge.     Conjunctiva/sclera: Conjunctivae normal.  Cardiovascular:     Rate and Rhythm: Normal rate and regular rhythm.  Pulmonary:      Effort: Pulmonary effort is normal. No respiratory distress.     Breath sounds: Normal breath sounds. No wheezing, rhonchi or rales.  Abdominal:     General: There is no distension.     Palpations: Abdomen is soft.     Tenderness: There is abdominal tenderness (Suprapubic ). There is no guarding or rebound.  Genitourinary:    Comments: Foley catheter in place with yellow urine in bag.  There is a bit of drainage from the urethra around the catheter site.  No testicular tenderness or crepitus. Musculoskeletal:     Cervical back: Neck supple.  Skin:    General: Skin is warm and dry.     Findings: No rash.  Neurological:     Mental Status: He is alert.     Comments: Clear speech.  Paraplegic.  Psychiatric:        Behavior: Behavior normal.     ED Results / Procedures / Treatments   Labs (all labs ordered are listed, but only abnormal results are displayed) Labs Reviewed - No data to display  EKG None  Radiology No results found.  Procedures Procedures (including critical care time)  Medications Ordered in ED Medications - No data to display  ED Course  I have reviewed the triage vital signs and the nursing notes.  Pertinent labs & imaging results that were available during my care of the patient were reviewed by me and considered in my medical decision making (see chart for details).    MDM Rules/Calculators/A&P                     Patient presents to the ED with concern for generalized weakness & UTI.  Patient is nontoxic, vitals are within normal limits. Ddx: Urinary tract infection, pyelonephritis, bacteremia, acute renal failure, electrolyte derangement, anemia,  deconditioning.  Additional history obtained:  Previous records obtained and reviewed for additional history. Patient is followed by urologist Dr. Dareen Piano with [facility] Patient had recent urinalysis with urine culture 12/08/2019 that grew out Pseudomonas susceptible to gentamicin,  ceftolozane, amikacin, intermediate to tobramycin, otherwise resistant. Urine culture 11/25/2019 also grew out Pseudomonas which was resistant with the exception of intermediate coverage with cefepime.  Lab Tests:  I Ordered, reviewed, and interpreted labs, which included:  CBC: Anemia similar to prior ranges, slightly lower. No leukocytosis.  CMP: Renal function similar to prior. No significant electrolyte derangement. Hypoalbuminemia present.  Lactic acid: Within normal limits Urinalysis: Grossly infected, also yeast present.  ED Course:  Following initial assessment of the patient fentanyl was ordered for pain. 10:12: Patient with return of pain, additional analgesics ordered. 12:20: Patient updated on results & plan of care.  Patient with grossly infected UTI.  He is symptomatic.  Most recent cultures only sensitive to IV antibiotics.  Will start gentamicin per pharmacy as well as Diflucan per pharmacy to treat UTI and yeast cystitis.  Will consult hospitalist service for admission.  Patient in agreement.  12:50: CONSULT: Discussed with hospitalist Dr. Arbutus Leas- accepts admission.   Patient's son at bedside, updated on results & plan of care, in agreement as well.   Findings and plan of care discussed with supervising physician Dr. Deretha Emory who is in agreement.   Portions of this note were generated with Scientist, clinical (histocompatibility and immunogenetics). Dictation errors may occur despite best attempts at proofreading.  Final Clinical Impression(s) / ED Diagnoses Final diagnoses:  Urinary tract infection associated with catheterization of urinary tract, unspecified indwelling urinary catheter type, initial encounter [facility])    Rx / DC Orders ED Discharge Orders    None       Cherly Anderson, PA-C 12/18/19 1328    Vanetta Mulders, MD 01/17/20 2308

## 2019-12-18 NOTE — ED Triage Notes (Signed)
EMS reports c/o generalized weakness for the past few weeks but worse over the past 3 days.  Reports is a parplegic, had indwelling foley.  Pt diagnosed with UTI and started antibiotics yesterday.  Reports has "colonization in bladder."  Pt alert and oriented.  Reports burning with urination.

## 2019-12-18 NOTE — H&P (Signed)
History and Physical  [name] GBT:[id number] DOB: 06-Jun-1941 DOA: 12/18/2019   PCP: Celene Squibb, MD   Patient coming from: Home  Chief Complaint: generalized weakness  HPI:  [name] is a 79 y.o. male with medical history of paraplegia secondary to cauda equina syndrome, hypertension, coronary artery disease, persistent atrial fibrillation, hypertension, diastolic CHF, anxiety, and urinary retention presenting with generalized weakness for the past month that has significantly worsened in the past 2 weeks.  The patient is normally wheelchair-bound, but for the last 2 weeks he has had difficulty even making transfers out of bed to his wheelchair.  He has required essentially max assist from his spouse who can no longer manage him at home secondary to the patient's progressive generalized weakness.  The patient states that he is essentially not been out of bed for the past 2 weeks.  Notably, the patient had urinary retention and visited to the emergency department on 11/25/2019.  A Foley catheter was placed at that time.  He followed up with his urologist at [facility] on 12/08/2019.  His Foley bag was changed at that time and a urine sample was given.  It grew MDR Pseudomonas.  It was felt to be colonization.  The patient himself complains of some intermittent shortness of breath but denies any coughing, chest pain, nausea, vomiting, diarrhea.  He has some intermittent suprapubic discomfort.  He has struggles with constipation and has to take Colace on a regular basis.  His last bowel movement was approximately 4 days prior to this admission.  In the emergency department, the patient was afebrile hemodynamically stable with oxygen saturation 98% room air.  BMP showed a serum creatinine of 1.34 which is near his baseline but elevated BUN of 31.  LFTs were unremarkable.  WBC at 7.0 hemoglobin 8.4, platelets 247,000.  UA shows >50 WBC.  Assessment/Plan: Generalized weakness -Likely due  to dehydration and deconditioning -Not completely convinced the patient has UTI in the setting of paraplegia with chronic indwelling Foley catheter -Serum X10 -TSH -Folic acid -UA as discussed above--follow culture -PT evaluation  Bacteriuria -12/08/2019 urine culture--MDR Pseudomonas -Not completely convinced that truly represents an infection as the patient is afebrile without any leukocytosis -Hold off additional antibiotics at this time -Follow culture data  Essential hypertension -Holding amlodipine -Continue Cardura  Persistent atrial fibrillation -Rate controlled -Continue rivaroxaban  Hyperlipidemia -Continue statin  Chronic diastolic CHF -Patient is clinically euvolemic -Daily weights -Hold Lasix for today and evaluate for restart in a.m. -12/27/2018 echo EF 55 to 60%, mild LVH  Urinary retention/neurogenic bladder -Continue tamsulosin and Proscar -Continue Ditropan  Anxiety/depression -Continue home dose alprazolam and Wellbutrin        Past Medical History:  Diagnosis Date  . Acute kidney failure, unspecified ([place])   . Acute renal failure ([place]) 04/17/2013  . Anemia, iron deficiency 09/09/2013  . Anxiety   . Anxiety disorder, unspecified   . Arthritis   . Atherosclerotic heart disease of native coronary artery without angina pectoris   . Atrial fibrillation ([place])   . 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 Cauda equina syndrome ([place])   . Chronic diastolic (congestive) heart failure ([place])   . Chronic kidney disease, stage 1   . Coronary artery disease    a. s/p CABG in 2000 with low-risk NST in 01/2017  . Diarrhea, unspecified   . Disorder of blood    BEEN TREATED BY DERMATOLOGIST X  4 YRS..."BLOOD BLISTERS"  . Epistaxis   . Essential (primary) hypertension   . Gastro-esophageal reflux disease without esophagitis   . Gout   . Gout, unspecified   . HTN (hypertension)    sees [name] in [place]  . Hx MRSA infection     rt shoulder  . Hyperglycemia, unspecified   . Hyperlipidemia   . Hyperlipidemia, unspecified   . Inflammatory disease of prostate, unspecified   . Iron deficiency anemia, unspecified   . Localized edema   . Low back pain   . Osteonecrosis, unspecified (H[facility])   . Other cervical disc degeneration, unspecified cervical region   . Personal history of Methicillin resistant Staphylococcus aureus infection   . Pneumonia    "I've had it 3-4 times"  . Pneumonia, unspecified organism   . Retention of urine, unspecified   . Secondary osteoarthritis, unspecified site   . Small bowel obstruction (HCC)   . Small bowel problem    HAD ALOT OF SCAR TISSUE FROM PREVIOUS SURGERIES..NG WAS INSERTED ...Marland KitchenMarland KitchenNO SURGERY NEEDED...Marland KitchenMarland KitchenIN FOR 8 DAYS  . Unspecified abnormalities of gait and mobility   . Unspecified atrial fibrillation (HCC)   . Ventral hernia   . Ventral hernia without obstruction or gangrene    Past Surgical History:  Procedure Laterality Date  . ANTERIOR CERVICAL CORPECTOMY  12/17/11  . ANTERIOR CERVICAL CORPECTOMY  12/17/2011   Procedure: ANTERIOR CERVICAL CORPECTOMY;  Surgeon: Karn Cassis, MD;  Location: [facility];  Service: Neurosurgery;  Laterality: N/A;  Anterior Cervical Decompression Fusion Five to Thoracic Two with plating  . BACK SURGERY     lumbar  . CATARACT EXTRACTION W/ INTRAOCULAR LENS  IMPLANT, BILATERAL  ? 2011  . CHOLECYSTECTOMY  2006 "or after"  . CORONARY ANGIOPLASTY WITH STENT PLACEMENT  2012  . CORONARY ARTERY BYPASS GRAFT  2000   CABG X5  . EYE SURGERY     bilateral cataract  . INCISION AND DRAINAGE OF WOUND  ~ 20ll; 12/03/11   "had infection in my right"  . LUMBAR LAMINECTOMY/DECOMPRESSION MICRODISCECTOMY  06/13/2011   Procedure: LUMBAR LAMINECTOMY/DECOMPRESSION MICRODISCECTOMY;  Surgeon: Karn Cassis;  Location: [facility];  Service: Neurosurgery;  Laterality: N/A;  Lumbar three, lumbar four-five Laminectomy  . LUMBAR LAMINECTOMY/DECOMPRESSION  MICRODISCECTOMY Right 06/17/2013   Procedure: LUMBAR LAMINECTOMY/DECOMPRESSION MICRODISCECTOMY 1 LEVEL;  Surgeon: Karn Cassis, MD;  Location: [facility];  Service: Neurosurgery;  Laterality: Right;  Right L3-4 Microdiskectomy  . LUMBAR WOUND DEBRIDEMENT N/A 08/02/2013   Procedure: INCISION AND DRAINAGE OF LUMBAR WOUND DEBRIDEMENT;  Surgeon: Karn Cassis, MD;  Location: [facility];  Service: Neurosurgery;  Laterality: N/A;  . PERIPHERALLY INSERTED CENTRAL CATHETER INSERTION  2011 & 11/2011  . SHOULDER ARTHROSCOPY Left 09/13/2013   Procedure: ARTHROSCOPIC IRRIGATION AND DEBRIDEMENT, SYNOVECTOMY ,  LEFT SHOULDER ;  Surgeon: Thera Flake., MD;  Location: [facility];  Service: Orthopedics;  Laterality: Left;  . SHOULDER OPEN ROTATOR CUFF REPAIR  ~ 2011   right  . STERNAL SURG  2000   HAS HAD 5-6 ON HIS STERNUM; "caught MRSA in it"  . TONSILLECTOMY  1949   Social History:  reports that he quit smoking about 20 years ago. His smoking use included cigarettes. He started smoking about 60 years ago. He has a 1.00 pack-year smoking history. He has never used smokeless tobacco. He reports that he does not drink alcohol or use drugs.   Family History  Problem Relation Age of Onset  . Heart disease  Other   . Hypertension Mother   . Hypertension Maternal Aunt   . Hypertension Maternal Uncle   . Anesthesia problems Neg Hx   . Hypotension Neg Hx   . Malignant hyperthermia Neg Hx   . Pseudochol deficiency Neg Hx      Allergies  Allergen Reactions  . Morphine Other (See Comments)    REACTION: made him go crazy; "I had fun w/it; my family didn't think it was too funny"  . Metoprolol Other (See Comments)    REACTION:  Tachycardia; "don't remember how bad it was; it was so long ago"  . Rifampin     Itch      Prior to Admission medications   Medication Sig Start Date End Date Taking? Authorizing Provider  acetaminophen (TYLENOL) 325 MG tablet Take 1-2 tablets (325-650 mg total) by mouth  every 4 (four) hours as needed for mild pain. 08/19/13  Yes Love, Evlyn KannerPamela S, PA-C  ALPRAZolam Prudy Feeler(XANAX) 0.5 MG tablet Take 0.25-0.5 mg by mouth 2 (two) times daily as needed for anxiety.   Yes [provider]  amLODipine (NORVASC) 5 MG tablet Take 5 mg by mouth daily.   Yes [provider]  atorvastatin (LIPITOR) 40 MG tablet TAKE 1 TABLET BY MOUTH EVERY DAY 09/03/15  Yes Laqueta LindenKoneswaran, Suresh A, MD  baclofen (LIORESAL) 10 MG tablet Take 10 mg by mouth 3 (three) times daily.   Yes [provider]  docusate sodium (COLACE) 100 MG capsule Take 100 mg by mouth daily.   Yes [provider]  doxazosin (CARDURA) 8 MG tablet Take 8 mg by mouth daily.   Yes [provider]  finasteride (PROSCAR) 5 MG tablet Take 5 mg by mouth daily.   Yes [provider]  furosemide (LASIX) 20 MG tablet Take 1 tablet (20 mg total) by mouth daily. 05/31/19 12/18/19 Yes Dyann KiefLenze, Michele M, PA-C  HYDROcodone-acetaminophen (NORCO/VICODIN) 5-325 MG tablet Take 1-2 tablets by mouth every 6 (six) hours as needed. 06/20/19  Yes Maxwell CaulLayden, Lindsey A, PA-C  Multiple Vitamin (MULTIVITAMIN) tablet Take 1 tablet by mouth daily.   Yes [provider]  nitroGLYCERIN (NITROSTAT) 0.4 MG SL tablet Place 0.4 mg under the tongue every 5 (five) minutes as needed for chest pain. May take up to 3 doses per episode. Contact MD or 911 if unresolved chest pain continues. 01/09/11  Yes Jodelle GrossLawrence, Kathryn M, NP  oxybutynin (DITROPAN XL) 15 MG 24 hr tablet Take 15 mg by mouth daily. 11/07/19  Yes [provider]  predniSONE (DELTASONE) 10 MG tablet Take 5-10 mg by mouth daily with breakfast.  01/06/18  Yes [provider]  rivaroxaban (XARELTO) 20 MG TABS tablet Take 1 tablet (20 mg total) by mouth daily with supper. 08/23/19  Yes Laqueta LindenKoneswaran, Suresh A, MD  tamsulosin (FLOMAX) 0.4 MG CAPS capsule Take 0.4 mg by mouth.   Yes [provider]  cephALEXin (KEFLEX) 250 MG capsule Take 1  capsule (250 mg total) by mouth 4 (four) times daily. Patient not taking: Reported on 12/18/2019 11/25/19   Terrilee FilesButler, Michael C, MD  traMADol (ULTRAM) 50 MG tablet Take 50 mg by mouth every 6 (six) hours as needed.    [provider]    Review of Systems:  Constitutional:  No weight loss, night sweats, Fevers, chills, Head&Eyes: No headache.  No vision loss.  No eye pain or scotoma ENT:  No Difficulty swallowing,Tooth/dental problems,Sore throat,  No ear ache, post nasal drip,  Cardio-vascular:  No chest pain, Orthopnea,  PND, swelling in lower extremities,  dizziness, palpitations  GI:  No  abdominal pain, nausea, vomiting, diarrhea, loss of appetite, hematochezia, melena, heartburn, indigestion, Resp:  No cough. No coughing up of blood .No wheezing.No chest wall deformity  Skin:  no rash or lesions.  GU:  no dysuria, change in color of urine, no urgency or frequency. No flank pain.  Musculoskeletal:  No joint pain or swelling. No decreased range of motion. No back pain.  Psych:  No change in mood or affect.  Neurologic: No headache, no dysesthesia, no focal weakness, no vision loss. No syncope  Physical Exam: Vitals:   12/18/19 1030 12/18/19 1100 12/18/19 1130 12/18/19 1200  BP: (!) 169/90 (!) 167/88 132/73 93/82  Pulse: 82 61  85  Resp: (!) 23 (!) 24 18 (!) 25  Temp:      TempSrc:      SpO2: 95% 98%  100%  Weight:      Height:       General:  A&O x 3, NAD, nontoxic, pleasant/cooperative Head/Eye: No conjunctival hemorrhage, no icterus, [place]/AT, No nystagmus ENT:  No icterus,  No thrush, good dentition, no pharyngeal exudate Neck:  No masses, no lymphadenpathy, no bruits CV:  iRRR, no rub, no gallop, no S3 Lung:  CTAB, good air movement, no wheeze, no rhonchi Abdomen: soft/NT, +BS, nondistended, no peritoneal signs Ext: No cyanosis, No rashes, No petechiae, No lymphangitis, No edema Neuro: CNII-XII intact, strength 4/5 in bilateral upper and lower extremities, no  dysmetria  Labs on Admission:  Basic Metabolic Panel: Recent Labs  Lab 12/18/19 1026  NA 137  K 4.3  CL 103  CO2 25  GLUCOSE 105*  BUN 31*  CREATININE 1.34*  CALCIUM 8.8*   Liver Function Tests: Recent Labs  Lab 12/18/19 1026  AST 12*  ALT 10  ALKPHOS 56  BILITOT 0.7  PROT 6.0*  ALBUMIN 3.2*   No results for input(s): LIPASE, AMYLASE in the last 168 hours. No results for input(s): AMMONIA in the last 168 hours. CBC: Recent Labs  Lab 12/18/19 1026  WBC 7.0  NEUTROABS 5.7  HGB 8.4*  HCT 28.1*  MCV 85.2  PLT 247   Coagulation Profile: No results for input(s): INR, PROTIME in the last 168 hours. Cardiac Enzymes: No results for input(s): CKTOTAL, CKMB, CKMBINDEX, TROPONINI in the last 168 hours. BNP: Invalid input(s): POCBNP CBG: No results for input(s): GLUCAP in the last 168 hours. Urine analysis:    Component Value Date/Time   COLORURINE YELLOW 12/18/2019 1009   APPEARANCEUR HAZY (A) 12/18/2019 1009   LABSPEC 1.008 12/18/2019 1009   PHURINE 5.0 12/18/2019 1009   GLUCOSEU NEGATIVE 12/18/2019 1009   HGBUR SMALL (A) 12/18/2019 1009   BILIRUBINUR NEGATIVE 12/18/2019 1009   KETONESUR NEGATIVE 12/18/2019 1009   PROTEINUR NEGATIVE 12/18/2019 1009   UROBILINOGEN 0.2 09/05/2013 1335   NITRITE POSITIVE (A) 12/18/2019 1009   LEUKOCYTESUR LARGE (A) 12/18/2019 1009   Sepsis Labs: @LABRCNTIP (procalcitonin:4,lacticidven:4) ) Recent Results (from the past 240 hour(s))  Blood culture (routine x 2)     Status: None (Preliminary result)   Collection Time: 12/18/19 10:26 AM   Specimen: Right Antecubital; Blood  Result Value Ref Range Status   Specimen Description   Final    RIGHT ANTECUBITAL BOTTLES DRAWN AEROBIC AND ANAEROBIC   Special Requests   Final    Blood Culture adequate volume Performed at [facility], [address], [place], [place]    Culture PENDING  Incomplete  Report Status PENDING  Incomplete  Blood culture (routine x 2)     Status:  None (Preliminary result)   Collection Time: 12/18/19 10:27 AM   Specimen: BLOOD RIGHT WRIST  Result Value Ref Range Status   Specimen Description   Final    BLOOD RIGHT WRIST BOTTLES DRAWN AEROBIC AND ANAEROBIC   Special Requests   Final    Blood Culture adequate volume Performed at [facility], [address], [place], [place] [address]    Culture PENDING  Incomplete   Report Status PENDING  Incomplete  SARS Coronavirus 2 by RT PCR (hospital order, performed in [facility] hospital lab) Nasopharyngeal Nasopharyngeal Swab     Status: None   Collection Time: 12/18/19 12:18 PM   Specimen: Nasopharyngeal Swab  Result Value Ref Range Status   SARS Coronavirus 2 NEGATIVE NEGATIVE Final    Comment: (NOTE) SARS-CoV-2 target nucleic acids are NOT DETECTED. The SARS-CoV-2 RNA is generally detectable in upper and lower respiratory specimens during the acute phase of infection. The lowest concentration of SARS-CoV-2 viral copies this assay can detect is 250 copies / mL. A negative result does not preclude SARS-CoV-2 infection and should not be used as the sole basis for treatment or other patient management decisions.  A negative result may occur with improper specimen collection / handling, submission of specimen other than nasopharyngeal swab, presence of viral mutation(s) within the areas targeted by this assay, and inadequate number of viral copies (<250 copies / mL). A negative result must be combined with clinical observations, patient history, and epidemiological information. Fact Sheet for Patients:   BoilerBrush.com.cy Fact Sheet for Healthcare Providers: https://pope.com/ This test is not yet approved or cleared  by the [place] FDA and has been authorized for detection and/or diagnosis of SARS-CoV-2 by FDA under an Emergency Use Authorization (EUA).  This EUA will remain in effect (meaning this test can be used) for the  duration of the COVID-19 declaration under Section 564(b)(1) of the Act, 21 U.S.C. section 360bbb-3(b)(1), unless the authorization is terminated or revoked sooner. Performed at [facility], [address], [place], [place] [address]      Radiological Exams on Admission: No results found.  EKG: Independently reviewed. Atrial fib, nonspecific STT change    Time spent:60 minutes Code Status:   FULL Family Communication:  Son update at bedside 5/23 Disposition Plan: expect 1-2 day hospitalization Consults called: none DVT Prophylaxis: Heparin    Catarina Hartshorn, DO  [facility] Pager [phone]  If 7PM-7AM, please contact night-coverage [email or web address] Password [facility] 12/18/2019, 1:36 PM

## 2019-12-19 ENCOUNTER — Ambulatory Visit (HOSPITAL_COMMUNITY): Payer: Medicare Other | Admitting: Physical Therapy

## 2019-12-19 DIAGNOSIS — G8220 Paraplegia, unspecified: Secondary | ICD-10-CM | POA: Diagnosis present

## 2019-12-19 DIAGNOSIS — Z66 Do not resuscitate: Secondary | ICD-10-CM | POA: Diagnosis not present

## 2019-12-19 DIAGNOSIS — N1831 Chronic kidney disease, stage 3a: Secondary | ICD-10-CM | POA: Diagnosis present

## 2019-12-19 DIAGNOSIS — Z20822 Contact with and (suspected) exposure to covid-19: Secondary | ICD-10-CM | POA: Diagnosis present

## 2019-12-19 DIAGNOSIS — I10 Essential (primary) hypertension: Secondary | ICD-10-CM | POA: Diagnosis not present

## 2019-12-19 DIAGNOSIS — R627 Adult failure to thrive: Secondary | ICD-10-CM

## 2019-12-19 DIAGNOSIS — R531 Weakness: Secondary | ICD-10-CM | POA: Diagnosis not present

## 2019-12-19 DIAGNOSIS — I4819 Other persistent atrial fibrillation: Secondary | ICD-10-CM | POA: Diagnosis present

## 2019-12-19 DIAGNOSIS — Z515 Encounter for palliative care: Secondary | ICD-10-CM | POA: Diagnosis not present

## 2019-12-19 DIAGNOSIS — E785 Hyperlipidemia, unspecified: Secondary | ICD-10-CM | POA: Diagnosis present

## 2019-12-19 DIAGNOSIS — Z951 Presence of aortocoronary bypass graft: Secondary | ICD-10-CM | POA: Diagnosis not present

## 2019-12-19 DIAGNOSIS — I2510 Atherosclerotic heart disease of native coronary artery without angina pectoris: Secondary | ICD-10-CM | POA: Diagnosis present

## 2019-12-19 DIAGNOSIS — E782 Mixed hyperlipidemia: Secondary | ICD-10-CM | POA: Diagnosis not present

## 2019-12-19 DIAGNOSIS — Z87440 Personal history of urinary (tract) infections: Secondary | ICD-10-CM | POA: Diagnosis not present

## 2019-12-19 DIAGNOSIS — Z681 Body mass index (BMI) 19 or less, adult: Secondary | ICD-10-CM | POA: Diagnosis not present

## 2019-12-19 DIAGNOSIS — Z7401 Bed confinement status: Secondary | ICD-10-CM | POA: Diagnosis not present

## 2019-12-19 DIAGNOSIS — R339 Retention of urine, unspecified: Secondary | ICD-10-CM | POA: Diagnosis present

## 2019-12-19 DIAGNOSIS — Z87891 Personal history of nicotine dependence: Secondary | ICD-10-CM | POA: Diagnosis not present

## 2019-12-19 DIAGNOSIS — K219 Gastro-esophageal reflux disease without esophagitis: Secondary | ICD-10-CM | POA: Diagnosis present

## 2019-12-19 DIAGNOSIS — E860 Dehydration: Secondary | ICD-10-CM | POA: Diagnosis present

## 2019-12-19 DIAGNOSIS — G834 Cauda equina syndrome: Secondary | ICD-10-CM | POA: Diagnosis present

## 2019-12-19 DIAGNOSIS — Z8614 Personal history of Methicillin resistant Staphylococcus aureus infection: Secondary | ICD-10-CM | POA: Diagnosis not present

## 2019-12-19 DIAGNOSIS — S343XXS Injury of cauda equina, sequela: Secondary | ICD-10-CM | POA: Diagnosis not present

## 2019-12-19 DIAGNOSIS — Z885 Allergy status to narcotic agent status: Secondary | ICD-10-CM | POA: Diagnosis not present

## 2019-12-19 DIAGNOSIS — F419 Anxiety disorder, unspecified: Secondary | ICD-10-CM | POA: Diagnosis present

## 2019-12-19 DIAGNOSIS — I5032 Chronic diastolic (congestive) heart failure: Secondary | ICD-10-CM | POA: Diagnosis present

## 2019-12-19 DIAGNOSIS — Z888 Allergy status to other drugs, medicaments and biological substances status: Secondary | ICD-10-CM | POA: Diagnosis not present

## 2019-12-19 DIAGNOSIS — I130 Hypertensive heart and chronic kidney disease with heart failure and stage 1 through stage 4 chronic kidney disease, or unspecified chronic kidney disease: Secondary | ICD-10-CM | POA: Diagnosis present

## 2019-12-19 LAB — CBC
HCT: 27.3 % — ABNORMAL LOW (ref 39.0–52.0)
Hemoglobin: 8 g/dL — ABNORMAL LOW (ref 13.0–17.0)
MCH: 25.2 pg — ABNORMAL LOW (ref 26.0–34.0)
MCHC: 29.3 g/dL — ABNORMAL LOW (ref 30.0–36.0)
MCV: 85.8 fL (ref 80.0–100.0)
Platelets: 248 10*3/uL (ref 150–400)
RBC: 3.18 MIL/uL — ABNORMAL LOW (ref 4.22–5.81)
RDW: 18.2 % — ABNORMAL HIGH (ref 11.5–15.5)
WBC: 6 10*3/uL (ref 4.0–10.5)
nRBC: 0 % (ref 0.0–0.2)

## 2019-12-19 LAB — T4, FREE: Free T4: 1.18 ng/dL — ABNORMAL HIGH (ref 0.61–1.12)

## 2019-12-19 LAB — BASIC METABOLIC PANEL
Anion gap: 7 (ref 5–15)
BUN: 27 mg/dL — ABNORMAL HIGH (ref 8–23)
CO2: 26 mmol/L (ref 22–32)
Calcium: 8.6 mg/dL — ABNORMAL LOW (ref 8.9–10.3)
Chloride: 106 mmol/L (ref 98–111)
Creatinine, Ser: 1.28 mg/dL — ABNORMAL HIGH (ref 0.61–1.24)
GFR calc Af Amer: >60 mL/min (ref >60)
GFR calc non Af Amer: 54 mL/min — ABNORMAL LOW (ref >60)
Glucose, Bld: 82 mg/dL (ref 70–99)
Potassium: 4.4 mmol/L (ref 3.5–5.1)
Sodium: 139 mmol/L (ref 135–145)

## 2019-12-19 LAB — GLUCOSE, CAPILLARY: Glucose-Capillary: 126 mg/dL — ABNORMAL HIGH (ref 70–99)

## 2019-12-19 LAB — GENTAMICIN LEVEL, RANDOM: Gentamicin Rm: 8.1 ug/mL

## 2019-12-19 MED ORDER — CHLORHEXIDINE GLUCONATE CLOTH 2 % EX PADS: 6.0000 | MEDICATED_PAD | Freq: Every day | CUTANEOUS | Status: DC

## 2019-12-19 MED ORDER — RIVAROXABAN 15 MG PO TABS
15.0000 mg | ORAL_TABLET | Freq: Every day | ORAL | Status: DC
  Administered 2019-12-19: 15 mg via ORAL
  Filled 2019-12-19: qty 1

## 2019-12-19 MED ORDER — DILTIAZEM HCL 30 MG PO TABS
30.0000 mg | ORAL_TABLET | Freq: Four times a day (QID) | ORAL | Status: DC
  Administered 2019-12-19 – 2019-12-20 (×3): 30 mg via ORAL
  Filled 2019-12-19 (×3): qty 1

## 2019-12-19 MED ORDER — PREDNISONE 10 MG PO TABS
10.0000 mg | ORAL_TABLET | Freq: Every day | ORAL | Status: DC
  Administered 2019-12-20: 10 mg via ORAL
  Filled 2019-12-19: qty 1

## 2019-12-19 MED ORDER — CYANOCOBALAMIN 1000 MCG/ML IJ SOLN
1000.0000 ug | Freq: Every day | INTRAMUSCULAR | Status: DC
  Administered 2019-12-19: 1000 ug via INTRAMUSCULAR
  Filled 2019-12-19 (×2): qty 1

## 2019-12-19 NOTE — Progress Notes (Signed)
   12/19/19 1823  Assess: MEWS Score  Temp 99.2 F (37.3 C)  BP (!) 118/56  ECG Heart Rate (!) 117  Resp 18  SpO2 95 %  O2 Device Room Air  Assess: MEWS Score  MEWS Temp 0  MEWS Systolic 0  MEWS Pulse 2  MEWS RR 0  MEWS LOC 0  MEWS Score 2  MEWS Score Color Yellow  Assess: if the MEWS score is Yellow or Red  Were vital signs taken at a resting state? Yes  Focused Assessment Documented focused assessment  Early Detection of Sepsis Score *See Row Information* Low  Treat  MEWS Interventions Administered scheduled meds/treatments;Administered prn meds/treatments  Take Vital Signs  Increase Vital Sign Frequency  Yellow: Q 2hr X 2 then Q 4hr X 2, if remains yellow, continue Q 4hrs  Escalate  MEWS: Escalate Yellow: discuss with charge nurse/RN and consider discussing with provider and RRT  Notify: Charge Nurse/RN  Name of Charge Nurse/RN Notified Johnsie Cancel, RN  Date Charge Nurse/RN Notified 12/19/19  Time Charge Nurse/RN Notified 1849  Notify: Provider  Provider Name/Title Catarina Hartshorn  Date Provider Notified 12/19/19  Time Provider Notified 1849  Notification Type Page  Notification Reason Change in status

## 2019-12-19 NOTE — Plan of Care (Signed)

## 2019-12-19 NOTE — Progress Notes (Signed)
Responded to nursing call:  afib RVR Arrived at bedside to eval patient.  He is sleepy but arouses to voice and follows simple one step commands and answers yes/no albeit with slow responses.   He denies cp, sob, f/c, n/v.  Reviewed tele--had short episodes afib RVR with HR into 150s which correlated with RN stating pt getting back to bed from BR. Now HR 110-120. RR-18-118/56, SaO2 95-98% on RA; Temp 99.2   Vitals:   12/19/19 0437 12/19/19 1138 12/19/19 1430 12/19/19 1823  BP: 113/68 (!) 155/81 104/71 (!) 118/56  Pulse: 85 98 84   Resp: 18  18 18   Temp: (!) 97.4 F (36.3 C)  (!) 97.3 F (36.3 C) 99.2 F (37.3 C)  TempSrc: Oral  Oral   SpO2: 99%  98% 95%  Weight:      Height:       CV--IRRR Lung--poor inspiratory effort; bibasilar rales Abd--soft+BS/NT   Assessment/Plan: Afib RVR -start diltiazem 30 mg po q 6 hours -order echo -TSH 1.595  Somnolence -d/c oxycodone -d/c gabapentin -d/c alprazolam -d/c baclofen     , DO [facility]

## 2019-12-19 NOTE — Evaluation (Signed)
Physical Therapy Evaluation Patient Details Name: [name] MRN: [id number] DOB: 1941-01-24 Today's Date: 12/19/2019   History of Present Illness  [name] is a 79 y.o. male with medical history of paraplegia secondary to cauda equina syndrome, hypertension, coronary artery disease, persistent atrial fibrillation, hypertension, diastolic CHF, anxiety, and urinary retention presenting with generalized weakness for the past month that has significantly worsened in the past 2 weeks.  The patient is normally wheelchair-bound, but for the last 2 weeks he has had difficulty even making transfers out of bed to his wheelchair.  He has required essentially max assist from his spouse who can no longer manage him at home secondary to the patient's progressive generalized weakness.  The patient states that he is essentially not been out of bed for the past 2 weeks.  Notably, the patient had urinary retention and visited to the emergency department on 11/25/2019.  A Foley catheter was placed at that time.  He followed up with his urologist at [facility] on 12/08/2019.  His Foley bag was changed at that time and a urine sample was given.  It grew MDR Pseudomonas.  It was felt to be colonization.  The patient himself complains of some intermittent shortness of breath but denies any coughing, chest pain, nausea, vomiting, diarrhea.  He has some intermittent suprapubic discomfort.  He has struggles with constipation and has to take Colace on a regular basis.  His last bowel movement was approximately 4 days prior to this admission.  In the emergency department, the patient was afebrile hemodynamically stable with oxygen saturation 98% room air.  BMP showed a serum creatinine of 1.34 which is near his baseline but elevated BUN of 31.  LFTs were unremarkable.  WBC at 7.0 hemoglobin 8.4, platelets 247,000.  UA shows >50 WBC.    Clinical Impression  Patient has difficulty sitting up at bedside due to generalized  weakness and requiring assistance to move BLE, leg leg weaker than right, tolerated sitting up at bedside with frequent leaning/falling to the right, unable to attempt sit to stands secondary to c/o fatigue and declined to attempt transferring to chair.  Patient put back to bed with Max assist to reposition.  Patient will benefit from continued physical therapy in hospital and recommended venue below to increase strength, balance, endurance for safe ADLs and gait.    Follow Up Recommendations SNF    Equipment Recommendations  None recommended by PT    Recommendations for Other Services       Precautions / Restrictions Precautions Precautions: Fall Restrictions Weight Bearing Restrictions: No      Mobility  Bed Mobility Overal bed mobility: Needs Assistance Bed Mobility: Supine to Sit;Sit to Supine     Supine to sit: Mod assist;Max assist Sit to supine: Mod assist   General bed mobility comments: slow labored movement  Transfers                    Ambulation/Gait                Stairs            Wheelchair Mobility    Modified Rankin (Stroke Patients Only)       Balance Overall balance assessment: Needs assistance Sitting-balance support: Feet supported;Bilateral upper extremity supported Sitting balance-Leahy Scale: Poor Sitting balance - Comments: fair/poor seated at EOB Postural control: Right lateral lean  Pertinent Vitals/Pain Pain Assessment: Faces Faces Pain Scale: Hurts little more Pain Location: with movement of legs, left worse than right Pain Descriptors / Indicators: Sore;Grimacing;Guarding Pain Intervention(s): Limited activity within patient's tolerance;Monitored during session;Repositioned    Home Living Family/patient expects to be discharged to:: Private residence Living Arrangements: Spouse/significant other Available Help at Discharge: Family;Available  PRN/intermittently Type of Home: House Home Access: Ramped entrance     Home Layout: One level Home Equipment: Walker - 2 wheels;Walker - 4 wheels;Wheelchair - manual;Shower seat;Grab bars - tub/shower;Grab bars - toilet      Prior Function Level of Independence: Needs assistance   Gait / Transfers Assistance Needed: Assisted transfers to wheelchair, non-ambulatory  ADL's / Homemaking Assistance Needed: assisted by family        Hand Dominance   Dominant Hand: Left    Extremity/Trunk Assessment   Upper Extremity Assessment Upper Extremity Assessment: Generalized weakness    Lower Extremity Assessment Lower Extremity Assessment: Generalized weakness;RLE deficits/detail;LLE deficits/detail RLE Deficits / Details: grossly -3/5 LLE Deficits / Details: grossly 2/5, 0/5 ankle dorsiflexion    Cervical / Trunk Assessment Cervical / Trunk Assessment: Normal  Communication   Communication: No difficulties  Cognition Arousal/Alertness: Awake/alert Behavior During Therapy: WFL for tasks assessed/performed Overall Cognitive Status: No family/caregiver present to determine baseline cognitive functioning                                 General Comments: Appears slightly confused, but cooperative, limited due to apprehension      General Comments      Exercises     Assessment/Plan    PT Assessment Patient needs continued PT services  PT Problem List Decreased strength;Decreased activity tolerance;Decreased balance;Decreased mobility       PT Treatment Interventions Functional mobility training;Therapeutic activities;Therapeutic exercise;Patient/family education;Wheelchair mobility training;Balance training    PT Goals (Current goals can be found in the Care Plan section)  Acute Rehab PT Goals Patient Stated Goal: return home after rehab PT Goal Formulation: With patient Time For Goal Achievement: 01/02/20 Potential to Achieve Goals: Good    Frequency  Min 3X/week   Barriers to discharge        Co-evaluation               AM-PAC PT "6 Clicks" Mobility  Outcome Measure Help needed turning from your back to your side while in a flat bed without using bedrails?: A Lot Help needed moving from lying on your back to sitting on the side of a flat bed without using bedrails?: A Lot Help needed moving to and from a bed to a chair (including a wheelchair)?: Total Help needed standing up from a chair using your arms (e.g., wheelchair or bedside chair)?: Total Help needed to walk in hospital room?: Total Help needed climbing 3-5 steps with a railing? : Total 6 Click Score: 8    End of Session   Activity Tolerance: Patient tolerated treatment well;Patient limited by fatigue Patient left: in bed;with call bell/phone within reach Nurse Communication: Mobility status PT Visit Diagnosis: Unsteadiness on feet (R26.81);Other abnormalities of gait and mobility (R26.89);Muscle weakness (generalized) (M62.81)    Time: 0737-1062 PT Time Calculation (min) (ACUTE ONLY): 28 min   Charges:   PT Evaluation $PT Eval Moderate Complexity: 1 Mod PT Treatments $Therapeutic Activity: 23-37 mins        3:11 PM, 12/19/19 Lonell Grandchild, MPT Physical Therapist with [facility] [phone]  office [phone] mobile phone

## 2019-12-19 NOTE — Progress Notes (Signed)
Pharmacy Renal Dosing Adjustment  Drug: rivaroxaban for atrial fibrillation Original dose: rivaroxaban 20mg  daily    CrClest~49ml/min New dose: rivaroxaban 15mg  daily    Thank You, 48m, Pharm. D. Clinical Pharmacist 12/19/2019 11:11 AM

## 2019-12-19 NOTE — NC FL2 (Signed)
[place] MEDICAID FL2 LEVEL OF CARE SCREENING TOOL     IDENTIFICATION  Patient Name: [name] Birthdate: Dec 05, 1940 Sex: male Admission Date (Current Location): 12/18/2019  [facility] and [place] Number:  [employer] and Address:  [facility],  [address], [place] [address]      Provider Number: [id number]  Attending Physician Name and Address:  Catarina Hartshorn, MD  Relative Name and Phone Number:  Elita Quick - daughter [phone]    Current Level of Care: SNF Recommended Level of Care: Skilled Nursing Facility Prior Approval Number:    Date Approved/Denied:   PASRR Number: [id number]  Discharge Plan: SNF    Current Diagnoses: Patient Active Problem List   Diagnosis Date Noted  . Failure to thrive in adult 12/19/2019  . Generalized weakness 12/18/2019  . Hyperglycemia, unspecified   . Cauda equina syndrome (HCC)   . Low back pain   . Atherosclerotic heart disease of native coronary artery without angina pectoris   . Essential (primary) hypertension   . Gastro-esophageal reflux disease without esophagitis   . Hyperlipidemia, unspecified   . Secondary osteoarthritis, unspecified site   . Anxiety disorder, unspecified   . Chronic diastolic (congestive) heart failure (HCC)   . Chronic kidney disease, stage 1   . Diarrhea, unspecified   . Epistaxis   . Inflammatory disease of prostate, unspecified   . Localized edema   . Other cervical disc degeneration, unspecified cervical region   . Retention of urine, unspecified   . Unspecified abnormalities of gait and mobility   . Unspecified atrial fibrillation (HCC)   . Persistent atrial fibrillation (HCC) 05/30/2019  . 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 Infection of urinary tract 10/26/2015  . Intractable back pain 10/22/2015  . Back pain 10/22/2015  . Muscle weakness (generalized) 12/05/2013  . Tight fascia 12/05/2013  . Decreased range of motion of right shoulder 12/05/2013   . Decreased range of motion of left shoulder 12/05/2013  . Bilateral leg weakness 11/21/2013  . Poor balance 11/21/2013  . Difficulty in walking(719.7) 11/21/2013  . Shoulder pain, left 09/12/2013  . Anemia, iron deficiency 09/09/2013  . Dyspnea 09/07/2013  . UTI (urinary tract infection) 09/07/2013  . Acute CHF (congestive heart failure) (HCC) 09/07/2013  . Elevated troponin 09/07/2013  . Fever and chills 09/06/2013  . Altered mental status 09/05/2013  . Neurogenic bowel 08/22/2013  . 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 Gout flare 08/22/2013  . Postoperative wound infection 08/22/2013  . Lumbar degenerative disc disease 08/02/2013  . Post-operative pain 07/29/2013  . Osteonecrosis (HCC) 05/05/2013  . Small bowel obstruction (HCC) 04/17/2013  . Acute renal failure (HCC) 04/17/2013  . Medial meniscus, posterior horn derangement 02/17/2013  . Knee sprain and strain 02/17/2013  . Trigger point with neck pain 03/18/2012  . Rotator cuff tear arthropathy of right shoulder 09/08/2011  . CAD in native artery 01/09/2011  . Ankle fracture 12/25/2010  . Contusion of shoulder, left 12/25/2010  . PEMPHIGUS VULGARIS 07/02/2010  . MRSA 05/13/2010  . PRURITUS 05/13/2010  . SPINAL STENOSIS, LUMBAR 05/13/2010  . HLD (hyperlipidemia) 04/24/2010  . GASTROESOPHAGEAL REFLUX DISEASE 04/24/2010  . VENTRAL HERNIA, INCISIONAL 04/24/2010  . DEGENERATIVE JOINT DISEASE 04/24/2010  . PYOGENIC ARTHRITIS, SHOULDER REGION 02/04/2010  . Essential hypertension 01/01/2010  . RUPTURE ROTATOR CUFF 02/19/2009    Orientation RESPIRATION BLADDER Height & Weight     Self, Time, Situation, Place  Normal Indwelling catheter Weight: 64.3 kg  Height:  6' (182.9 cm)  BEHAVIORAL SYMPTOMS/MOOD NEUROLOGICAL BOWEL NUTRITION STATUS      Continent Diet(Heart Healthy)  AMBULATORY STATUS COMMUNICATION OF NEEDS Skin   Extensive Assist Verbally Skin abrasions(knees and ankles)                        Personal Care Assistance Level of Assistance  Bathing, Dressing, Feeding Bathing Assistance: Limited assistance Feeding assistance: Limited assistance Dressing Assistance: Maximum assistance     Functional Limitations Info  Sight, Hearing, Speech Sight Info: Impaired Hearing Info: Adequate Speech Info: Impaired    SPECIAL CARE FACTORS FREQUENCY  PT (By licensed PT)     PT Frequency: 5 times a week              Contractures      Additional Factors Info  Code Status, Allergies, Psychotropic Code Status Info: FULL Allergies Info: Morphine, metoprolol rifampin Psychotropic Info: alprazolam and Wellbutrin         Current Medications (12/19/2019):  This is the current hospital active medication list Current Facility-Administered Medications  Medication Dose Route Frequency Provider Last Rate Last Admin  . 0.9 %  sodium chloride infusion   Intravenous Continuous Tat, Onalee Hua, MD 75 mL/hr at 12/19/19 0503 New Bag at 12/19/19 0503  . 0.9 %  sodium chloride infusion   Intravenous Continuous Tat, David, MD      . acetaminophen (TYLENOL) tablet 650 mg  650 mg Oral Q6H PRN Tat, Onalee Hua, MD   650 mg at 12/18/19 1712   Or  . acetaminophen (TYLENOL) suppository 650 mg  650 mg Rectal Q6H PRN Tat, Onalee Hua, MD      . ALPRAZolam Prudy Feeler) tablet 0.25-0.5 mg  0.25-0.5 mg Oral BID PRN Tat, Onalee Hua, MD      . atorvastatin (LIPITOR) tablet 40 mg  40 mg Oral Daily Tat, Onalee Hua, MD   40 mg at 12/18/19 1701  . baclofen (LIORESAL) tablet 10 mg  10 mg Oral TID Catarina Hartshorn, MD   10 mg at 12/18/19 2121  . cyanocobalamin ((VITAMIN B-12)) injection 1,000 mcg  1,000 mcg Intramuscular Daily Tat, David, MD      . doxazosin (CARDURA) tablet 8 mg  8 mg Oral Daily Tat, David, MD   8 mg at 12/18/19 1700  . finasteride (PROSCAR) tablet 5 mg  5 mg Oral Daily Tat, David, MD   5 mg at 12/18/19 1700  . furosemide (LASIX) tablet 20 mg  20 mg Oral Daily Tat, David, MD   20 mg at 12/18/19 1700  . gabapentin  (NEURONTIN) capsule 100 mg  100 mg Oral TID Catarina Hartshorn, MD   100 mg at 12/18/19 2121  . multivitamin with minerals tablet 1 tablet  1 tablet Oral Daily Tat, David, MD   1 tablet at 12/18/19 1700  . ondansetron (ZOFRAN) tablet 4 mg  4 mg Oral Q6H PRN Tat, David, MD       Or  . ondansetron (ZOFRAN) injection 4 mg  4 mg Intravenous Q6H PRN Tat, Onalee Hua, MD      . oxybutynin (DITROPAN-XL) 24 hr tablet 15 mg  15 mg Oral Daily Tat, David, MD   15 mg at 12/18/19 1701  . oxyCODONE (Oxy IR/ROXICODONE) immediate release tablet 5-10 mg  5-10 mg Oral Q6H PRN Audrea Muscat T, NP   10 mg at 12/19/19 0504  . polyethylene glycol (MIRALAX / GLYCOLAX) packet 17 g  17 g Oral Daily Tat, David, MD   17 g at  12/18/19 1702  . predniSONE (DELTASONE) tablet 5-10 mg  5-10 mg Oral Q breakfast Tat, David, MD      . rivaroxaban Alveda Reasons) tablet 20 mg  20 mg Oral Q supper Tat, Shanon Brow, MD   20 mg at 12/18/19 1701  . senna (SENOKOT) tablet 17.2 mg  2 tablet Oral Daily Tat, David, MD   17.2 mg at 12/18/19 1700  . tamsulosin (FLOMAX) capsule 0.4 mg  0.4 mg Oral QPC breakfast Tat, Shanon Brow, MD         Discharge Medications: Please see discharge summary for a list of discharge medications.  Relevant Imaging Results:  Relevant Lab Results:   Additional Information SS# [id number]  Boneta Lucks, RN

## 2019-12-19 NOTE — Progress Notes (Addendum)
Patient having sustained HR in 120-130s spiking into 180s as reported by telemetry.  Patient is in Afib. O2 sat 95% on RA.  BP 118/56.  CBG 126.  Patient was arrousable.  Dr. Arbutus Leas was notified by page about patient's status.  Charge nurse was also notified.     MEWS protocol initiated.  Vitals signs will be taken q2 x 2 and then q4.

## 2019-12-19 NOTE — Progress Notes (Signed)
PROGRESS NOTE  [name] YNW:[id number] DOB: 1940-08-04 DOA: 12/18/2019 PCP: Benita Stabile, MD  Brief History:   79 y.o. male with medical history of paraplegia secondary to cauda equina syndrome, hypertension, coronary artery disease, persistent atrial fibrillation, hypertension, diastolic CHF, anxiety, and urinary retention presenting with generalized weakness for the past month that has significantly worsened in the past 2 weeks.  The patient is normally wheelchair-bound, but for the last 2 weeks he has had difficulty even making transfers out of bed to his wheelchair.  He has required essentially max assist from his spouse who can no longer manage him at home secondary to the patient's progressive generalized weakness.  The patient states that he is essentially not been out of bed for the past 2 weeks.  Notably, the patient had urinary retention and visited to the emergency department on 11/25/2019.  A Foley catheter was placed at that time.  He followed up with his urologist at [facility] on 12/08/2019.  His Foley bag was changed at that time and a urine sample was given.  It grew MDR Pseudomonas.  It was felt to be colonization.  The patient himself complains of some intermittent shortness of breath but denies any coughing, chest pain, nausea, vomiting, diarrhea.  He has some intermittent suprapubic discomfort.  He has struggles with constipation and has to take Colace on a regular basis.  His last bowel movement was approximately 4 days prior to this admission.  In the emergency department, the patient was afebrile hemodynamically stable with oxygen saturation 98% room air.  BMP showed a serum creatinine of 1.34 which is near his baseline but elevated BUN of 31.  LFTs were unremarkable.  WBC at 7.0 hemoglobin 8.4, platelets 247,000.  UA shows >50 WBC.  Assessment/Plan: Generalized weakness -Likely due to dehydration and deconditioning -Not convinced the patient has UTI in the setting of  paraplegia with chronic indwelling Foley catheter -Serum B12--134 -TSH--1.595 -Folic acid--13.5 -UA as discussed above--follow culture -PT evaluation  Failure to Thrive -poor po intake -continued decline in functional status -family/spouse unable to care for patient at home -PT eval -replete B12 -IVF -palliative medicine consult -personally reviewed CXR--chronic interstitial change  Bacteriuria -12/08/2019 urine culture--MDR Pseudomonas -Not convinced that truly represents an infection as the patient is afebrile without any leukocytosis -Hold off additional antibiotics at this time -Follow culture data -likely colonization  CKD 3a -baseline creatinine 1.2-1.4  Essential hypertension -Holding amlodipine -Continue Cardura  Persistent atrial fibrillation -Rate controlled -Continue rivaroxaban -personally reviewed EKG--afib, nonspecific T wave change  Hyperlipidemia -Continue statin  Chronic diastolic CHF -Patient is clinically euvolemic -Daily weights -Hold Lasix for today and evaluate for restart in a.m. -12/27/2018 echo EF 55 to 60%, mild LVH  Urinary retention/neurogenic bladder -Continue tamsulosin and Proscar -Continue Ditropan  Anxiety/depression -Continue home dose alprazolam and Wellbutrin       Status is: Observation   Dispo: The patient is from: Home              Anticipated d/c is to: SNF              Anticipated d/c date is: 1 day              Patient currently is not medically stable to d/c.  Po intake remains poor.  Cannot care for self at home with decline in functional status        Family Communication:   Daughter updated 5/24  Consultants:  Palliative medicine  Code Status:  FULL   DVT Prophylaxis:  Xarelto   Procedures: As Listed in Progress Note Above  Antibiotics: None    Total time spent 35 minutes.  Greater than 50% spent face to face counseling and coordinating care.     Subjective: Patient denies  fevers, chills, headache, chest pain, dyspnea, nausea, vomiting, diarrhea, abdominal pain, dysuria, hematuria, hematochezia, and melena.   Objective: Vitals:   12/18/19 1600 12/18/19 2000 12/18/19 2333 12/19/19 0437  BP:  122/73 129/76 113/68  Pulse:  64 75 85  Resp:  19 18 18   Temp:  98 F (36.7 C) 98.2 F (36.8 C) (!) 97.4 F (36.3 C)  TempSrc:  Oral Oral Oral  SpO2:  95% 99% 99%  Weight: 64.3 kg     Height: 6' (1.829 m)       Intake/Output Summary (Last 24 hours) at 12/19/2019 1021 Last data filed at 12/19/2019 0513 Gross per 24 hour  Intake 1649.49 ml  Output 1300 ml  Net 349.49 ml   Weight change:  Exam:   General:  Pt is alert, follows commands appropriately, not in acute distress  HEENT: No icterus, No thrush, No neck mass, [place]/AT  Cardiovascular: RRR, S1/S2, no rubs, no gallops  Respiratory: fine bibasilar rales. No wheeze  Abdomen: Soft/+BS, non tender, non distended, no guarding  Extremities: No edema, No lymphangitis, No petechiae, No rashes, no synovitis   Data Reviewed: I have personally reviewed following labs and imaging studies Basic Metabolic Panel: Recent Labs  Lab 12/18/19 1026 12/19/19 0412  NA 137 139  K 4.3 4.4  CL 103 106  CO2 25 26  GLUCOSE 105* 82  BUN 31* 27*  CREATININE 1.34* 1.28*  CALCIUM 8.8* 8.6*   Liver Function Tests: Recent Labs  Lab 12/18/19 1026  AST 12*  ALT 10  ALKPHOS 56  BILITOT 0.7  PROT 6.0*  ALBUMIN 3.2*   No results for input(s): LIPASE, AMYLASE in the last 168 hours. No results for input(s): AMMONIA in the last 168 hours. Coagulation Profile: No results for input(s): INR, PROTIME in the last 168 hours. CBC: Recent Labs  Lab 12/18/19 1026 12/19/19 0412  WBC 7.0 6.0  NEUTROABS 5.7  --   HGB 8.4* 8.0*  HCT 28.1* 27.3*  MCV 85.2 85.8  PLT 247 248   Cardiac Enzymes: Recent Labs  Lab 12/18/19 1725  CKTOTAL 28*   BNP: Invalid input(s): POCBNP CBG: No results for input(s): GLUCAP in the  last 168 hours. HbA1C: No results for input(s): HGBA1C in the last 72 hours. Urine analysis:    Component Value Date/Time   COLORURINE YELLOW 12/18/2019 1009   APPEARANCEUR HAZY (A) 12/18/2019 1009   LABSPEC 1.008 12/18/2019 1009   PHURINE 5.0 12/18/2019 1009   GLUCOSEU NEGATIVE 12/18/2019 1009   HGBUR SMALL (A) 12/18/2019 1009   BILIRUBINUR NEGATIVE 12/18/2019 1009   KETONESUR NEGATIVE 12/18/2019 1009   PROTEINUR NEGATIVE 12/18/2019 1009   UROBILINOGEN 0.2 09/05/2013 1335   NITRITE POSITIVE (A) 12/18/2019 1009   LEUKOCYTESUR LARGE (A) 12/18/2019 1009   Sepsis Labs: @LABRCNTIP (procalcitonin:4,lacticidven:4) ) Recent Results (from the past 240 hour(s))  Blood culture (routine x 2)     Status: None (Preliminary result)   Collection Time: 12/18/19 10:26 AM   Specimen: Right Antecubital; Blood  Result Value Ref Range Status   Specimen Description   Final    RIGHT ANTECUBITAL BOTTLES DRAWN AEROBIC AND ANAEROBIC   Special Requests Blood Culture adequate volume  Final  Culture   Final    NO GROWTH < 24 HOURS Performed at [facility], [address], [place], [place] [address]    Report Status PENDING  Incomplete  Blood culture (routine x 2)     Status: None (Preliminary result)   Collection Time: 12/18/19 10:27 AM   Specimen: BLOOD RIGHT WRIST  Result Value Ref Range Status   Specimen Description   Final    BLOOD RIGHT WRIST BOTTLES DRAWN AEROBIC AND ANAEROBIC   Special Requests Blood Culture adequate volume  Final   Culture   Final    NO GROWTH < 24 HOURS Performed at [facility], [address] [place], [place] [address]    Report Status PENDING  Incomplete  SARS Coronavirus 2 by RT PCR (hospital order, performed in [facility] hospital lab) Nasopharyngeal Nasopharyngeal Swab     Status: None   Collection Time: 12/18/19 12:18 PM   Specimen: Nasopharyngeal Swab  Result Value Ref Range Status   SARS Coronavirus 2 NEGATIVE NEGATIVE Final    Comment: (NOTE) SARS-CoV-2  target nucleic acids are NOT DETECTED. The SARS-CoV-2 RNA is generally detectable in upper and lower respiratory specimens during the acute phase of infection. The lowest concentration of SARS-CoV-2 viral copies this assay can detect is 250 copies / mL. A negative result does not preclude SARS-CoV-2 infection and should not be used as the sole basis for treatment or other patient management decisions.  A negative result may occur with improper specimen collection / handling, submission of specimen other than nasopharyngeal swab, presence of viral mutation(s) within the areas targeted by this assay, and inadequate number of viral copies (<250 copies / mL). A negative result must be combined with clinical observations, patient history, and epidemiological information. Fact Sheet for Patients:   BoilerBrush.com.cy Fact Sheet for Healthcare Providers: https://pope.com/ This test is not yet approved or cleared  by the [place] FDA and has been authorized for detection and/or diagnosis of SARS-CoV-2 by FDA under an Emergency Use Authorization (EUA).  This EUA will remain in effect (meaning this test can be used) for the duration of the COVID-19 declaration under Section 564(b)(1) of the Act, 21 U.S.C. section 360bbb-3(b)(1), unless the authorization is terminated or revoked sooner. Performed at [facility], [address], [place], [place] [address]      Scheduled Meds: . atorvastatin  40 mg Oral Daily  . baclofen  10 mg Oral TID  . cyanocobalamin  1,000 mcg Intramuscular Daily  . doxazosin  8 mg Oral Daily  . finasteride  5 mg Oral Daily  . furosemide  20 mg Oral Daily  . gabapentin  100 mg Oral TID  . multivitamin with minerals  1 tablet Oral Daily  . oxybutynin  15 mg Oral Daily  . polyethylene glycol  17 g Oral Daily  . predniSONE  5-10 mg Oral Q breakfast  . rivaroxaban  20 mg Oral Q supper  . senna  2 tablet Oral Daily  .  tamsulosin  0.4 mg Oral QPC breakfast   Continuous Infusions: . sodium chloride 75 mL/hr at 12/19/19 0503  . sodium chloride      Procedures/Studies: CT HEAD WO CONTRAST  Result Date: 12/18/2019 CLINICAL DATA:  Weakness, history of paraplegia EXAM: CT HEAD WITHOUT CONTRAST TECHNIQUE: Contiguous axial images were obtained from the base of the skull through the vertex without intravenous contrast. COMPARISON:  06/20/2019 FINDINGS: Brain: No evidence of acute infarction, hemorrhage, hydrocephalus, extra-axial collection or mass lesion/mass effect. Periventricular white matter  hypodensity. Vascular: No hyperdense vessel or unexpected calcification. Skull: Normal. Negative for fracture or focal lesion. Sinuses/Orbits: No acute finding. Other: None. IMPRESSION: No acute intracranial pathology.  Small-vessel white matter disease. Electronically Signed   By: Lauralyn Primes M.D.   On: 12/18/2019 16:52   DG CHEST PORT 1 VIEW  Result Date: 12/18/2019 CLINICAL DATA:  Generalized weakness, urinary tract infection EXAM: PORTABLE CHEST 1 VIEW COMPARISON:  07/28/2019 FINDINGS: 2 frontal views of the chest demonstrate a stable cardiac silhouette. Continued atherosclerosis of the thoracic aorta. Chronic right pleural thickening versus trace pleural effusion, stable. No airspace disease or pneumothorax. Postsurgical changes at the cervicothoracic junction and thoracolumbar junction. IMPRESSION: 1. Stable exam, no acute process. Electronically Signed   By: Sharlet Salina M.D.   On: 12/18/2019 16:54    Catarina Hartshorn, DO  [facility]  If 7PM-7AM, please contact night-coverage [email or web address] Password [email or web address] 12/19/2019, 10:21 AM   LOS: 0 days

## 2019-12-19 NOTE — Plan of Care (Signed)
  Problem: Acute Rehab PT Goals(only PT should resolve) Goal: Pt Will Go Supine/Side To Sit Outcome: Progressing Flowsheets (Taken 12/19/2019 1513) Pt will go Supine/Side to Sit: with minimal assist   Problem: Acute Rehab PT Goals(only PT should resolve) Goal: Patient Will Transfer Sit To/From Stand Outcome: Progressing Flowsheets (Taken 12/19/2019 1513) Patient will transfer sit to/from stand: with moderate assist   Problem: Acute Rehab PT Goals(only PT should resolve) Goal: Pt Will Transfer Bed To Chair/Chair To Bed Outcome: Progressing Flowsheets (Taken 12/19/2019 1513) Pt will Transfer Bed to Chair/Chair to Bed: with mod assist   3:14 PM, 12/19/19 Ocie Bob, MPT Physical Therapist with [facility] [phone] office [phone] mobile phone

## 2019-12-20 ENCOUNTER — Inpatient Hospital Stay (HOSPITAL_COMMUNITY)
Admission: RE | Admit: 2019-12-20 | Discharge: 2019-12-21 | DRG: 951 | Disposition: A | Discharge status: Hospice | Source: Hospice | Attending: Internal Medicine | Admitting: Internal Medicine

## 2019-12-20 DIAGNOSIS — Z20822 Contact with and (suspected) exposure to covid-19: Secondary | ICD-10-CM | POA: Diagnosis present

## 2019-12-20 DIAGNOSIS — I4891 Unspecified atrial fibrillation: Secondary | ICD-10-CM | POA: Diagnosis not present

## 2019-12-20 DIAGNOSIS — Z888 Allergy status to other drugs, medicaments and biological substances status: Secondary | ICD-10-CM

## 2019-12-20 DIAGNOSIS — N390 Urinary tract infection, site not specified: Secondary | ICD-10-CM | POA: Diagnosis not present

## 2019-12-20 DIAGNOSIS — Z7189 Other specified counseling: Secondary | ICD-10-CM

## 2019-12-20 DIAGNOSIS — R54 Age-related physical debility: Secondary | ICD-10-CM | POA: Diagnosis not present

## 2019-12-20 DIAGNOSIS — N1831 Chronic kidney disease, stage 3a: Secondary | ICD-10-CM | POA: Diagnosis present

## 2019-12-20 DIAGNOSIS — M109 Gout, unspecified: Secondary | ICD-10-CM | POA: Diagnosis present

## 2019-12-20 DIAGNOSIS — I5032 Chronic diastolic (congestive) heart failure: Secondary | ICD-10-CM | POA: Diagnosis not present

## 2019-12-20 DIAGNOSIS — N401 Enlarged prostate with lower urinary tract symptoms: Secondary | ICD-10-CM | POA: Diagnosis not present

## 2019-12-20 DIAGNOSIS — G834 Cauda equina syndrome: Secondary | ICD-10-CM | POA: Diagnosis not present

## 2019-12-20 DIAGNOSIS — K219 Gastro-esophageal reflux disease without esophagitis: Secondary | ICD-10-CM | POA: Diagnosis not present

## 2019-12-20 DIAGNOSIS — Z87891 Personal history of nicotine dependence: Secondary | ICD-10-CM

## 2019-12-20 DIAGNOSIS — R627 Adult failure to thrive: Secondary | ICD-10-CM | POA: Diagnosis present

## 2019-12-20 DIAGNOSIS — I130 Hypertensive heart and chronic kidney disease with heart failure and stage 1 through stage 4 chronic kidney disease, or unspecified chronic kidney disease: Secondary | ICD-10-CM | POA: Diagnosis present

## 2019-12-20 DIAGNOSIS — Z955 Presence of coronary angioplasty implant and graft: Secondary | ICD-10-CM

## 2019-12-20 DIAGNOSIS — R52 Pain, unspecified: Secondary | ICD-10-CM | POA: Diagnosis not present

## 2019-12-20 DIAGNOSIS — I4819 Other persistent atrial fibrillation: Secondary | ICD-10-CM | POA: Diagnosis present

## 2019-12-20 DIAGNOSIS — Z9842 Cataract extraction status, left eye: Secondary | ICD-10-CM | POA: Diagnosis not present

## 2019-12-20 DIAGNOSIS — R339 Retention of urine, unspecified: Secondary | ICD-10-CM | POA: Diagnosis present

## 2019-12-20 DIAGNOSIS — R531 Weakness: Secondary | ICD-10-CM | POA: Diagnosis not present

## 2019-12-20 DIAGNOSIS — T83511A Infection and inflammatory reaction due to indwelling urethral catheter, initial encounter: Secondary | ICD-10-CM | POA: Diagnosis not present

## 2019-12-20 DIAGNOSIS — Z7401 Bed confinement status: Secondary | ICD-10-CM

## 2019-12-20 DIAGNOSIS — Z961 Presence of intraocular lens: Secondary | ICD-10-CM | POA: Diagnosis present

## 2019-12-20 DIAGNOSIS — Z515 Encounter for palliative care: Secondary | ICD-10-CM | POA: Diagnosis not present

## 2019-12-20 DIAGNOSIS — R262 Difficulty in walking, not elsewhere classified: Secondary | ICD-10-CM | POA: Diagnosis not present

## 2019-12-20 DIAGNOSIS — Z9841 Cataract extraction status, right eye: Secondary | ICD-10-CM

## 2019-12-20 DIAGNOSIS — Z79899 Other long term (current) drug therapy: Secondary | ICD-10-CM | POA: Diagnosis not present

## 2019-12-20 DIAGNOSIS — I2510 Atherosclerotic heart disease of native coronary artery without angina pectoris: Secondary | ICD-10-CM | POA: Diagnosis present

## 2019-12-20 DIAGNOSIS — Z66 Do not resuscitate: Secondary | ICD-10-CM | POA: Diagnosis present

## 2019-12-20 DIAGNOSIS — S343XXD Injury of cauda equina, subsequent encounter: Secondary | ICD-10-CM

## 2019-12-20 DIAGNOSIS — E785 Hyperlipidemia, unspecified: Secondary | ICD-10-CM | POA: Diagnosis not present

## 2019-12-20 DIAGNOSIS — F419 Anxiety disorder, unspecified: Secondary | ICD-10-CM | POA: Diagnosis present

## 2019-12-20 DIAGNOSIS — G8220 Paraplegia, unspecified: Secondary | ICD-10-CM | POA: Diagnosis present

## 2019-12-20 DIAGNOSIS — Z8614 Personal history of Methicillin resistant Staphylococcus aureus infection: Secondary | ICD-10-CM

## 2019-12-20 DIAGNOSIS — E860 Dehydration: Secondary | ICD-10-CM | POA: Diagnosis present

## 2019-12-20 DIAGNOSIS — S343XXS Injury of cauda equina, sequela: Secondary | ICD-10-CM | POA: Diagnosis not present

## 2019-12-20 DIAGNOSIS — Z7952 Long term (current) use of systemic steroids: Secondary | ICD-10-CM

## 2019-12-20 DIAGNOSIS — R3914 Feeling of incomplete bladder emptying: Secondary | ICD-10-CM | POA: Diagnosis present

## 2019-12-20 DIAGNOSIS — Z885 Allergy status to narcotic agent status: Secondary | ICD-10-CM

## 2019-12-20 DIAGNOSIS — D649 Anemia, unspecified: Secondary | ICD-10-CM | POA: Diagnosis not present

## 2019-12-20 DIAGNOSIS — N319 Neuromuscular dysfunction of bladder, unspecified: Secondary | ICD-10-CM | POA: Diagnosis not present

## 2019-12-20 DIAGNOSIS — Z8249 Family history of ischemic heart disease and other diseases of the circulatory system: Secondary | ICD-10-CM

## 2019-12-20 DIAGNOSIS — I509 Heart failure, unspecified: Secondary | ICD-10-CM | POA: Diagnosis not present

## 2019-12-20 DIAGNOSIS — N181 Chronic kidney disease, stage 1: Secondary | ICD-10-CM | POA: Diagnosis not present

## 2019-12-20 LAB — BASIC METABOLIC PANEL
Anion gap: 10 (ref 5–15)
BUN: 30 mg/dL — ABNORMAL HIGH (ref 8–23)
CO2: 25 mmol/L (ref 22–32)
Calcium: 9 mg/dL (ref 8.9–10.3)
Chloride: 102 mmol/L (ref 98–111)
Creatinine, Ser: 1.28 mg/dL — ABNORMAL HIGH (ref 0.61–1.24)
GFR calc Af Amer: >60 mL/min (ref >60)
GFR calc non Af Amer: 54 mL/min — ABNORMAL LOW (ref >60)
Glucose, Bld: 112 mg/dL — ABNORMAL HIGH (ref 70–99)
Potassium: 5.1 mmol/L (ref 3.5–5.1)
Sodium: 137 mmol/L (ref 135–145)

## 2019-12-20 LAB — CBC
HCT: 28.5 % — ABNORMAL LOW (ref 39.0–52.0)
Hemoglobin: 8.4 g/dL — ABNORMAL LOW (ref 13.0–17.0)
MCH: 25.1 pg — ABNORMAL LOW (ref 26.0–34.0)
MCHC: 29.5 g/dL — ABNORMAL LOW (ref 30.0–36.0)
MCV: 85.3 fL (ref 80.0–100.0)
Platelets: 266 10*3/uL (ref 150–400)
RBC: 3.34 MIL/uL — ABNORMAL LOW (ref 4.22–5.81)
RDW: 18 % — ABNORMAL HIGH (ref 11.5–15.5)
WBC: 5.7 10*3/uL (ref 4.0–10.5)
nRBC: 0 % (ref 0.0–0.2)

## 2019-12-20 LAB — MAGNESIUM: Magnesium: 2 mg/dL (ref 1.7–2.4)

## 2019-12-20 MED ORDER — BIOTENE DRY MOUTH MT LIQD: 15.0000 mL | OROMUCOSAL | Status: DC | PRN

## 2019-12-20 MED ORDER — HALOPERIDOL LACTATE 2 MG/ML PO CONC
0.5000 mg | ORAL | Status: DC | PRN
  Filled 2019-12-20: qty 0.3

## 2019-12-20 MED ORDER — SODIUM CHLORIDE 0.9% FLUSH
3.0000 mL | Freq: Two times a day (BID) | INTRAVENOUS | Status: DC
  Administered 2019-12-20: 3 mL via INTRAVENOUS

## 2019-12-20 MED ORDER — MORPHINE SULFATE (CONCENTRATE) 10 MG/0.5ML PO SOLN
5.0000 mg | ORAL | Status: DC | PRN
  Filled 2019-12-20: qty 0.5

## 2019-12-20 MED ORDER — BACLOFEN 10 MG PO TABS
10.0000 mg | ORAL_TABLET | Freq: Three times a day (TID) | ORAL | Status: DC
  Administered 2019-12-20: 10 mg via ORAL
  Filled 2019-12-20: qty 1

## 2019-12-20 MED ORDER — MORPHINE SULFATE (CONCENTRATE) 10 MG/0.5ML PO SOLN
5.0000 mg | ORAL | Status: DC | PRN
  Administered 2019-12-20 (×3): 5 mg via ORAL
  Filled 2019-12-20 (×2): qty 0.5

## 2019-12-20 MED ORDER — BACLOFEN 10 MG PO TABS
10.0000 mg | ORAL_TABLET | Freq: Three times a day (TID) | ORAL | Status: DC
  Administered 2019-12-20 – 2019-12-21 (×2): 10 mg via ORAL
  Filled 2019-12-20 (×3): qty 1

## 2019-12-20 MED ORDER — ONDANSETRON 4 MG PO TBDP: 4.0000 mg | ORAL_TABLET | Freq: Four times a day (QID) | ORAL | Status: DC | PRN

## 2019-12-20 MED ORDER — SODIUM CHLORIDE 0.9% FLUSH: 3.0000 mL | INTRAVENOUS | Status: DC | PRN

## 2019-12-20 MED ORDER — GLYCOPYRROLATE 0.2 MG/ML IJ SOLN: 0.2000 mg | INTRAMUSCULAR | Status: DC | PRN

## 2019-12-20 MED ORDER — LORAZEPAM 2 MG/ML PO CONC: 1.0000 mg | ORAL | Status: DC | PRN

## 2019-12-20 MED ORDER — LORAZEPAM 1 MG PO TABS: 1.0000 mg | ORAL_TABLET | ORAL | Status: DC | PRN

## 2019-12-20 MED ORDER — POLYVINYL ALCOHOL 1.4 % OP SOLN: 1.0000 [drp] | Freq: Four times a day (QID) | OPHTHALMIC | Status: DC | PRN

## 2019-12-20 MED ORDER — ACETAMINOPHEN 325 MG PO TABS: 650.0000 mg | ORAL_TABLET | Freq: Four times a day (QID) | ORAL | Status: DC | PRN

## 2019-12-20 MED ORDER — TRAMADOL HCL 50 MG PO TABS
50.0000 mg | ORAL_TABLET | Freq: Four times a day (QID) | ORAL | Status: DC | PRN
  Administered 2019-12-20: 50 mg via ORAL
  Filled 2019-12-20: qty 1

## 2019-12-20 MED ORDER — LORAZEPAM 2 MG/ML IJ SOLN: 1.0000 mg | INTRAMUSCULAR | Status: DC | PRN

## 2019-12-20 MED ORDER — HALOPERIDOL LACTATE 5 MG/ML IJ SOLN: 0.5000 mg | INTRAMUSCULAR | Status: DC | PRN

## 2019-12-20 MED ORDER — SODIUM CHLORIDE 0.9 % IV SOLN: 250.0000 mL | INTRAVENOUS | Status: DC | PRN

## 2019-12-20 MED ORDER — TRAMADOL HCL 50 MG PO TABS: 50.0000 mg | ORAL_TABLET | Freq: Four times a day (QID) | ORAL | Status: DC | PRN

## 2019-12-20 MED ORDER — HALOPERIDOL 0.5 MG PO TABS: 0.5000 mg | ORAL_TABLET | ORAL | Status: DC | PRN

## 2019-12-20 MED ORDER — ACETAMINOPHEN 650 MG RE SUPP: 650.0000 mg | Freq: Four times a day (QID) | RECTAL | Status: DC | PRN

## 2019-12-20 MED ORDER — ONDANSETRON HCL 4 MG/2ML IJ SOLN
4.0000 mg | Freq: Four times a day (QID) | INTRAMUSCULAR | Status: DC | PRN
  Administered 2019-12-21: 4 mg via INTRAVENOUS
  Filled 2019-12-20: qty 2

## 2019-12-20 MED ORDER — GABAPENTIN 100 MG PO CAPS
100.0000 mg | ORAL_CAPSULE | Freq: Three times a day (TID) | ORAL | Status: DC
  Administered 2019-12-20: 100 mg via ORAL
  Filled 2019-12-20: qty 1

## 2019-12-20 MED ORDER — ALPRAZOLAM 0.25 MG PO TABS
0.2500 mg | ORAL_TABLET | Freq: Two times a day (BID) | ORAL | Status: DC | PRN
  Filled 2019-12-20: qty 2

## 2019-12-20 MED ORDER — ATROPINE SULFATE 1 % OP SOLN: 4.0000 [drp] | OPHTHALMIC | Status: DC | PRN

## 2019-12-20 MED ORDER — GLYCOPYRROLATE 1 MG PO TABS: 1.0000 mg | ORAL_TABLET | ORAL | Status: DC | PRN

## 2019-12-20 MED ORDER — MORPHINE SULFATE (PF) 2 MG/ML IV SOLN
1.0000 mg | INTRAVENOUS | Status: DC | PRN
  Administered 2019-12-20 – 2019-12-21 (×9): 1 mg via INTRAVENOUS
  Filled 2019-12-20 (×9): qty 1

## 2019-12-20 NOTE — Progress Notes (Signed)
Front desk called, they are made aware that patient can have unrestricted visitors d/t comfort care status.

## 2019-12-20 NOTE — TOC Progression Note (Signed)
Transition of Care [facility]) - Progression Note    Patient Details  Name: [name] MRN: [id number] Date of Birth: Nov 16, 1940  [facility] of [facility] CM/SW Contact  Leitha Bleak, RN Phone Number: 12/20/2019, 4:26 PM  Clinical Narrative:   Palliative consult with family, decision made for [facility]. [facility] accepted the referral. NO beds available. Consents signed and visitation policy reviewed with family. MD and RN updated. Patient is now GIP.    Expected Discharge Plan: Hospice Medical Facility Barriers to Discharge: Hospice Bed not available(Will make GIP)  Expected Discharge Plan and Services Expected Discharge Plan: Hospice Medical Facility       Living arrangements for the past 2 months: Single Family Home Expected Discharge Date: 12/20/19

## 2019-12-20 NOTE — Discharge Summary (Signed)
Physician Discharge Summary  [name] CWC:[id number] DOB: 05/16/1941 DOA: 12/18/2019  PCP: Benita Stabile, MD  Admit date: 12/18/2019 Discharge date: 12/20/2019  Admitted From: Home Disposition:  Residential Hospice    Discharge Condition: Stable CODE STATUS: FULL COMFORT Diet recommendation: Regular/Comfort Feeding   Brief/Interim Summary: 79 y.o.malewith medical history ofparaplegia secondary to cauda equina syndrome, hypertension, coronary artery disease, persistent atrial fibrillation, hypertension, diastolic CHF, anxiety, and urinary retention presenting with generalized weakness for the past month that has significantly worsened in the past 2 weeks. The patient is normally wheelchair-bound, but for the last 2 weeks he has had difficulty even making transfers out of bed to his wheelchair. He has required essentially max assist from his spouse who can no longer manage him at home secondary to the patient's progressive generalized weakness. The patient states that he is essentially not been out of bed for the past 2 weeks. Notably, the patient had urinary retention and visited to the emergency department on 11/25/2019. A Foley catheter was placed at that time. He followed up with his urologist at [facility] on 12/08/2019. His Foley bag was changed at that time and a urine sample was given. It grew MDR Pseudomonas. It was felt to be colonization. The patient himself complains of some intermittent shortness of breath but denies any coughing, chest pain, nausea, vomiting, diarrhea. He has some intermittent suprapubic discomfort. He has struggles with constipation and has to take Colace on a regular basis. His last bowel movement was approximately 4 days prior to this admission. In the emergency department, the patient was afebrile hemodynamically stable with oxygen saturation 98% room air. BMP showed a serum creatinine of 1.34 which is near his baseline but elevated BUN of 31.  LFTs were unremarkable. WBC at 7.0 hemoglobin 8.4, platelets 247,000. UA shows >50 WBC. Although the patient initially received a dose of gentamicin in the ED, this was discontinued as his bacteruria was likely colonization with patient remaining afebrile and hemodynamically stable.  During the hospitalization, his po intake remained poor.  He was started on IVF.  He developed RVR with his persistent afib.  He was started on diltiazem. He continued to remain weak and essentially bed bound. Multiple discussions were held with patient's family regarding advanced care planning.  They requested a palliative medicine consult.  Family meeting was held on 12/20/19 with the palliative medicine team.  The family and patient felt he would be best served by transitioning his focus of care to be directed at full comfort.  Medications with curative intent were subsequently discontinued.  Because there were no beds available, the patient was transitioned to general inpatient hospice status.  Discharge Diagnoses:  Generalized weakness -Likely due to dehydration and deconditioning -Not convinced the patient has UTI in the setting of paraplegia with chronic indwelling Foley catheter -Serum B12--134 -TSH--1.595 -Folic acid--13.5 -UA as discussed above--follow culture -PT evaluation initially ordered and recommended SNF -family meeting with palliative medicine team 12/20/19--> The family and patient felt he would be best served by transitioning his focus of care to be directed at full comfort.  Medications with curative intent were subsequently discontinued.   Failure to Thrive -poor po intake -continued decline in functional status -family/spouse unable to care for patient at home -PT eval -repleted B12 initially -IVF initially started -personally reviewed CXR--chronic interstitial change -palliative medicine consult -family meeting with palliative medicine team 12/20/19--> The family and patient felt he  would be best served by transitioning his focus of care to be  directed at full comfort.  Medications with curative intent were subsequently discontinued.   Bacteriuria -12/08/2019 urine culture--MDR Pseudomonas -Not convinced that truly represents an infection as the patient is afebrile without any leukocytosis -Hold off additional antibiotics at this time -Follow culture data -likely colonization -family meeting with palliative medicine team 12/20/19--> The family and patient felt he would be best served by transitioning his focus of care to be directed at full comfort.  Medications with curative intent were subsequently discontinued.   CKD 3a -baseline creatinine 1.2-1.4 -family meeting with palliative medicine team 12/20/19--> The family and patient felt he would be best served by transitioning his focus of care to be directed at full comfort.  Medications with curative intent were subsequently discontinued.   Essential hypertension -Holding amlodipine -Continue Cardura -no longer active issue  Persistent atrial fibrillation with RVR -initially started on diltiazem -Continue rivaroxaban -personally reviewed EKG--afib, nonspecific T wave change -family meeting with palliative medicine team 12/20/19--> The family and patient felt he would be best served by transitioning his focus of care to be directed at full comfort.  Medications with curative intent were subsequently discontinued.   Hyperlipidemia -Continue statin  Chronic diastolic CHF -Patient is clinically euvolemic -Daily weights -Hold Lasix for today and evaluate for restart in a.m. -12/27/2018 echo EF 55 to 60%, mild LVH -no longer active issue  Urinary retention/neurogenic bladder -Continue tamsulosin and Proscar -Continue Ditropan -maintain chronic indwelling foley catheter -family meeting with palliative medicine team 12/20/19--> The family and patient felt he would be best served by transitioning his focus of care  to be directed at full comfort.  Medications with curative intent were subsequently discontinued.   Anxiety/depression -Continue home dose alprazolam and Wellbutrin    Discharge Instructions   Allergies as of 12/20/2019      Reactions   Morphine Other (See Comments)   REACTION: made him go crazy; "I had fun w/it; my family didn't think it was too funny"   Metoprolol Other (See Comments)   REACTION:  Tachycardia; "don't remember how bad it was; it was so long ago"   Rifampin    Itch      Medication List    STOP taking these medications   acetaminophen 325 MG tablet Commonly known as: TYLENOL   amLODipine 5 MG tablet Commonly known as: NORVASC   atorvastatin 40 MG tablet Commonly known as: LIPITOR   cephALEXin 250 MG capsule Commonly known as: KEFLEX   docusate sodium 100 MG capsule Commonly known as: COLACE   doxazosin 8 MG tablet Commonly known as: CARDURA   furosemide 20 MG tablet Commonly known as: LASIX   HYDROcodone-acetaminophen 5-325 MG tablet Commonly known as: NORCO/VICODIN   nitroGLYCERIN 0.4 MG SL tablet Commonly known as: NITROSTAT   rivaroxaban 20 MG Tabs tablet Commonly known as: XARELTO     TAKE these medications   ALPRAZolam 0.5 MG tablet Commonly known as: XANAX Take 0.25-0.5 mg by mouth 2 (two) times daily as needed for anxiety.   baclofen 10 MG tablet Commonly known as: LIORESAL Take 10 mg by mouth 3 (three) times daily.   finasteride 5 MG tablet Commonly known as: PROSCAR Take 5 mg by mouth daily.   multivitamin tablet Take 1 tablet by mouth daily.   oxybutynin 15 MG 24 hr tablet Commonly known as: DITROPAN XL Take 15 mg by mouth daily.   predniSONE 10 MG tablet Commonly known as: DELTASONE Take 5-10 mg by mouth daily with breakfast.   tamsulosin 0.4 MG Caps  capsule Commonly known as: FLOMAX Take 0.4 mg by mouth.   traMADol 50 MG tablet Commonly known as: ULTRAM Take 50 mg by mouth every 6 (six) hours as  needed.      Contact information for after-discharge care    [place] .   Service: Inpatient Hospice Contact information: [address] [place] [address] [phone]             Allergies  Allergen Reactions  . Morphine Other (See Comments)    REACTION: made him go crazy; "I had fun w/it; my family didn't think it was too funny"  . Metoprolol Other (See Comments)    REACTION:  Tachycardia; "don't remember how bad it was; it was so long ago"  . Rifampin     Itch     Consultations:  Palliative medicine   Procedures/Studies: CT HEAD WO CONTRAST  Result Date: 12/18/2019 CLINICAL DATA:  Weakness, history of paraplegia EXAM: CT HEAD WITHOUT CONTRAST TECHNIQUE: Contiguous axial images were obtained from the base of the skull through the vertex without intravenous contrast. COMPARISON:  06/20/2019 FINDINGS: Brain: No evidence of acute infarction, hemorrhage, hydrocephalus, extra-axial collection or mass lesion/mass effect. Periventricular white matter hypodensity. Vascular: No hyperdense vessel or unexpected calcification. Skull: Normal. Negative for fracture or focal lesion. Sinuses/Orbits: No acute finding. Other: None. IMPRESSION: No acute intracranial pathology.  Small-vessel white matter disease. Electronically Signed   By: Eddie Candle M.D.   On: 12/18/2019 16:52   DG CHEST PORT 1 VIEW  Result Date: 12/18/2019 CLINICAL DATA:  Generalized weakness, urinary tract infection EXAM: PORTABLE CHEST 1 VIEW COMPARISON:  07/28/2019 FINDINGS: 2 frontal views of the chest demonstrate a stable cardiac silhouette. Continued atherosclerosis of the thoracic aorta. Chronic right pleural thickening versus trace pleural effusion, stable. No airspace disease or pneumothorax. Postsurgical changes at the cervicothoracic junction and thoracolumbar junction. IMPRESSION: 1. Stable exam, no acute process. Electronically Signed   By: Randa Ngo M.D.   On: 12/18/2019 16:54        Discharge Exam: Vitals:   12/20/19 0604 12/20/19 0759  BP: (!) 119/56   Pulse: 72   Resp: 20   Temp: 98.4 F (36.9 C)   SpO2: 95% 96%   Vitals:   12/19/19 2230 12/20/19 0220 12/20/19 0604 12/20/19 0759  BP: (!) 118/59 118/71 (!) 119/56   Pulse: 69 74 72   Resp: 20 18 20    Temp: (!) 97.5 F (36.4 C) 97.6 F (36.4 C) 98.4 F (36.9 C)   TempSrc: Axillary Oral Oral   SpO2: 97% 98% 95% 96%  Weight:      Height:        General: Pt is alert, awake, not in acute distress Cardiovascular: IRRR, S1/S2 +, no rubs, no gallops Respiratory: bibasilar rales. No wheeze Abdominal: Soft, NT, ND, bowel sounds + Extremities: no edema, no cyanosis   The results of significant diagnostics from this hospitalization (including imaging, microbiology, ancillary and laboratory) are listed below for reference.    Significant Diagnostic Studies: CT HEAD WO CONTRAST  Result Date: 12/18/2019 CLINICAL DATA:  Weakness, history of paraplegia EXAM: CT HEAD WITHOUT CONTRAST TECHNIQUE: Contiguous axial images were obtained from the base of the skull through the vertex without intravenous contrast. COMPARISON:  06/20/2019 FINDINGS: Brain: No evidence of acute infarction, hemorrhage, hydrocephalus, extra-axial collection or mass lesion/mass effect. Periventricular white matter hypodensity. Vascular: No hyperdense vessel or unexpected calcification. Skull: Normal. Negative for fracture or focal  lesion. Sinuses/Orbits: No acute finding. Other: None. IMPRESSION: No acute intracranial pathology.  Small-vessel white matter disease. Electronically Signed   By: Lauralyn PrimesAlex  Bibbey M.D.   On: 12/18/2019 16:52   DG CHEST PORT 1 VIEW  Result Date: 12/18/2019 CLINICAL DATA:  Generalized weakness, urinary tract infection EXAM: PORTABLE CHEST 1 VIEW COMPARISON:  07/28/2019 FINDINGS: 2 frontal views of the chest demonstrate a stable cardiac silhouette. Continued atherosclerosis of the  thoracic aorta. Chronic right pleural thickening versus trace pleural effusion, stable. No airspace disease or pneumothorax. Postsurgical changes at the cervicothoracic junction and thoracolumbar junction. IMPRESSION: 1. Stable exam, no acute process. Electronically Signed   By: Sharlet SalinaMichael  Brown M.D.   On: 12/18/2019 16:54     Microbiology: Recent Results (from the past 240 hour(s))  Urine culture     Status: Abnormal (Preliminary result)   Collection Time: 12/18/19 10:09 AM   Specimen: Urine, Clean Catch  Result Value Ref Range Status   Specimen Description   Final    URINE, CLEAN CATCH Performed at [facility], [address] [place], [place] [address]    Special Requests   Final    NONE Performed at [facility], [address] [place], [place] [address]    Culture (A)  Final    >=100,000 COLONIES/mL PSEUDOMONAS AERUGINOSA SUSCEPTIBILITIES TO FOLLOW Performed at [facility], [address] [place], [place] [address]    Report Status PENDING  Incomplete  Blood culture (routine x 2)     Status: None (Preliminary result)   Collection Time: 12/18/19 10:26 AM   Specimen: Right Antecubital; Blood  Result Value Ref Range Status   Specimen Description   Final    RIGHT ANTECUBITAL BOTTLES DRAWN AEROBIC AND ANAEROBIC   Special Requests Blood Culture adequate volume  Final   Culture   Final    NO GROWTH 2 DAYS Performed at [facility], [address] [place], [place] [address]    Report Status PENDING  Incomplete  Blood culture (routine x 2)     Status: None (Preliminary result)   Collection Time: 12/18/19 10:27 AM   Specimen: BLOOD RIGHT WRIST  Result Value Ref Range Status   Specimen Description   Final    BLOOD RIGHT WRIST BOTTLES DRAWN AEROBIC AND ANAEROBIC   Special Requests Blood Culture adequate volume  Final   Culture   Final    NO GROWTH 2 DAYS Performed at [facility], [address] [place], [place] [address]    Report Status PENDING  Incomplete  SARS  Coronavirus 2 by RT PCR (hospital order, performed in [facility] hospital lab) Nasopharyngeal Nasopharyngeal Swab     Status: None   Collection Time: 12/18/19 12:18 PM   Specimen: Nasopharyngeal Swab  Result Value Ref Range Status   SARS Coronavirus 2 NEGATIVE NEGATIVE Final    Comment: (NOTE) SARS-CoV-2 target nucleic acids are NOT DETECTED. The SARS-CoV-2 RNA is generally detectable in upper and lower respiratory specimens during the acute phase of infection. The lowest concentration of SARS-CoV-2 viral copies this assay can detect is 250 copies / mL. A negative result does not preclude SARS-CoV-2 infection and should not be used as the sole basis for treatment or other patient management decisions.  A negative result may occur with improper specimen collection / handling, submission of specimen other than nasopharyngeal swab, presence of viral mutation(s) within the areas targeted by this assay, and inadequate number of viral copies (<250 copies / mL). A negative result must be combined  with clinical observations, patient history, and epidemiological information. Fact Sheet for Patients:   BoilerBrush.com.cy Fact Sheet for Healthcare Providers: https://pope.com/ This test is not yet approved or cleared  by the [place] FDA and has been authorized for detection and/or diagnosis of SARS-CoV-2 by FDA under an Emergency Use Authorization (EUA).  This EUA will remain in effect (meaning this test can be used) for the duration of the COVID-19 declaration under Section 564(b)(1) of the Act, 21 U.S.C. section 360bbb-3(b)(1), unless the authorization is terminated or revoked sooner. Performed at [facility], [address], [place], [place] [address]      Labs: Basic Metabolic Panel: Recent Labs  Lab 12/18/19 1026 12/18/19 1026 12/19/19 0412 12/20/19 0641  NA 137  --  139 137  K 4.3   < > 4.4 5.1  CL 103  --  106 102  CO2 25   --  26 25  GLUCOSE 105*  --  82 112*  BUN 31*  --  27* 30*  CREATININE 1.34*  --  1.28* 1.28*  CALCIUM 8.8*  --  8.6* 9.0  MG  --   --   --  2.0   < > = values in this interval not displayed.   Liver Function Tests: Recent Labs  Lab 12/18/19 1026  AST 12*  ALT 10  ALKPHOS 56  BILITOT 0.7  PROT 6.0*  ALBUMIN 3.2*   No results for input(s): LIPASE, AMYLASE in the last 168 hours. No results for input(s): AMMONIA in the last 168 hours. CBC: Recent Labs  Lab 12/18/19 1026 12/19/19 0412 12/20/19 0641  WBC 7.0 6.0 5.7  NEUTROABS 5.7  --   --   HGB 8.4* 8.0* 8.4*  HCT 28.1* 27.3* 28.5*  MCV 85.2 85.8 85.3  PLT 247 248 266   Cardiac Enzymes: Recent Labs  Lab 12/18/19 1725  CKTOTAL 28*   BNP: Invalid input(s): POCBNP CBG: Recent Labs  Lab 12/19/19 1857  GLUCAP 126*    Time coordinating discharge:  36 minutes  Signed:  Catarina Hartshorn, DO [facility] Pager: [phone] 12/20/2019, 4:45 PM

## 2019-12-20 NOTE — H&P (Signed)
History and Physical  [name] UDJ:[id number] DOB: 09/16/1940 DOA: 12/20/2019   PCP: Benita Stabile, MD   Patient coming from: Home  Chief Complaint: End of Life care  HPI:  [name] is a 79 y.o.malewith medical history ofparaplegia secondary to cauda equina syndrome, hypertension, coronary artery disease, persistent atrial fibrillation, hypertension, diastolic CHF, anxiety, and urinary retention presenting with generalized weakness for the past month that has significantly worsened in the past 2 weeks. The patient is normally wheelchair-bound, but for the last 2 weeks he has had difficulty even making transfers out of bed to his wheelchair. He has required essentially max assist from his spouse who can no longer manage him at home secondary to the patient's progressive generalized weakness. The patient states that he is essentially not been out of bed for the past 2 weeks. Notably, the patient had urinary retention and visited to the emergency department on 11/25/2019. A Foley catheter was placed at that time. He followed up with his urologist at [facility] on 12/08/2019. His Foley bag was changed at that time and a urine sample was given. It grew MDR Pseudomonas. It was felt to be colonization. The patient himself complains of some intermittent shortness of breath but denies any coughing, chest pain, nausea, vomiting, diarrhea. He has some intermittent suprapubic discomfort. He has struggles with constipation and has to take Colace on a regular basis. His last bowel movement was approximately 4 days prior to this admission. In the emergency department, the patient was afebrile hemodynamically stable with oxygen saturation 98% room air. BMP showed a serum creatinine of 1.34 which is near his baseline but elevated BUN of 31. LFTs were unremarkable. WBC at 7.0 hemoglobin 8.4, platelets 247,000. UA shows >50 WBC. Although the patient initially received a dose of  gentamicin in the ED, this was discontinued as his bacteruria was likely colonization with patient remaining afebrile and hemodynamically stable.  During the hospitalization, his po intake remained poor.  He was started on IVF.  He developed RVR with his persistent afib.  He was started on diltiazem. He continued to remain weak and essentially bed bound. Multiple discussions were held with patient's family regarding advanced care planning.  They requested a palliative medicine consult.  Family meeting was held on 12/20/19 with the palliative medicine team.  The family and patient felt he would be best served by transitioning his focus of care to be directed at full comfort.  Medications with curative intent were subsequently discontinued.  Because there were no beds available, the patient was transitioned to general inpatient hospice status.   Assessment/Plan: Generalized weakness -Likely due to dehydration and deconditioning -Not convinced the patient has UTI in the setting of paraplegia with chronic indwelling Foley catheter -Serum B12--134 -TSH--1.595 -Folic acid--13.5 -UA as discussed above--follow culture -PT evaluation initially ordered and recommended SNF -family meeting with palliative medicine team 12/20/19--> The family and patient felt he would be best served by transitioning his focus of care to be directed at full comfort.  Medications with curative intent were subsequently discontinued.   Failure to Thrive -poor po intake -continued decline in functional status -family/spouse unable to care for patient at home -PT eval -repleted B12 initially -IVF initially started -personally reviewed CXR--chronic interstitial change -palliative medicine consult -family meeting with palliative medicine team 12/20/19--> The family and patient felt he would be best served by transitioning his focus of care to be directed at full comfort.  Medications with curative intent  were subsequently  discontinued.   Bacteriuria -12/08/2019 urine culture--MDR Pseudomonas -Not convinced that truly represents an infection as the patient is afebrile without any leukocytosis -Hold off additional antibiotics at this time -Follow culture data -likely colonization -family meeting with palliative medicine team 12/20/19--> The family and patient felt he would be best served by transitioning his focus of care to be directed at full comfort.  Medications with curative intent were subsequently discontinued.   CKD 3a -baseline creatinine 1.2-1.4 -family meeting with palliative medicine team 12/20/19--> The family and patient felt he would be best served by transitioning his focus of care to be directed at full comfort.  Medications with curative intent were subsequently discontinued.   Essential hypertension -Holding amlodipine -Continue Cardura -no longer active issue  Persistent atrial fibrillation with RVR -initially started on diltiazem -Continue rivaroxaban -personally reviewed EKG--afib, nonspecific T wave change -family meeting with palliative medicine team 12/20/19--> The family and patient felt he would be best served by transitioning his focus of care to be directed at full comfort.  Medications with curative intent were subsequently discontinued.   Hyperlipidemia -Continue statin  Chronic diastolic CHF -Patient is clinically euvolemic -Daily weights -Hold Lasix for today and evaluate for restart in a.m. -12/27/2018 echo EF 55 to 60%, mild LVH -no longer active issue  Urinary retention/neurogenic bladder -Continue tamsulosin and Proscar -Continue Ditropan -maintain chronic indwelling foley catheter -family meeting with palliative medicine team 12/20/19--> The family and patient felt he would be best served by transitioning his focus of care to be directed at full comfort.  Medications with curative intent were subsequently discontinued.   Anxiety/depression -Continue home  dose alprazolam and Wellbutrin       Past Medical History:  Diagnosis Date  . Acute kidney failure, unspecified ([place])   . Acute renal failure ([place]) 04/17/2013  . Anemia, iron deficiency 09/09/2013  . Anxiety   . Anxiety disorder, unspecified   . Arthritis   . Atherosclerotic heart disease of native coronary artery without angina pectoris   . Atrial fibrillation ([place])   . 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 Cauda equina syndrome ([place])   . Chronic diastolic (congestive) heart failure ([place])   . Chronic kidney disease, stage 1   . Coronary artery disease    a. s/p CABG in 2000 with low-risk NST in 01/2017  . Diarrhea, unspecified   . Disorder of blood    BEEN TREATED BY DERMATOLOGIST X 4 YRS..."BLOOD BLISTERS"  . Epistaxis   . Essential (primary) hypertension   . Gastro-esophageal reflux disease without esophagitis   . Gout   . Gout, unspecified   . HTN (hypertension)    sees Dr. [name] in [place]  . Hx MRSA infection    rt shoulder  . Hyperglycemia, unspecified   . Hyperlipidemia   . Hyperlipidemia, unspecified   . Inflammatory disease of prostate, unspecified   . Iron deficiency anemia, unspecified   . Localized edema   . Low back pain   . Osteonecrosis, unspecified ([place])   . Other cervical disc degeneration, unspecified cervical region   . Personal history of Methicillin resistant Staphylococcus aureus infection   . Pneumonia    "I've had it 3-4 times"  . Pneumonia, unspecified organism   . Retention of urine, unspecified   . Secondary osteoarthritis, unspecified site   . Small bowel obstruction ([place])   . Small bowel problem    HAD ALOT OF SCAR TISSUE FROM PREVIOUS SURGERIES..NG WAS INSERTED .Marland KitchenMarland KitchenMarland KitchenMarland Kitchen  NO SURGERY NEEDED...Marland KitchenMarland KitchenIN FOR 8 DAYS  . Unspecified abnormalities of gait and mobility   . Unspecified atrial fibrillation (HCC)   . Ventral hernia   . Ventral hernia without obstruction or gangrene    Past Surgical History:  Procedure  Laterality Date  . ANTERIOR CERVICAL CORPECTOMY  12/17/11  . ANTERIOR CERVICAL CORPECTOMY  12/17/2011   Procedure: ANTERIOR CERVICAL CORPECTOMY;  Surgeon: Karn Cassis, MD;  Location: [facility];  Service: Neurosurgery;  Laterality: N/A;  Anterior Cervical Decompression Fusion Five to Thoracic Two with plating  . BACK SURGERY     lumbar  . CATARACT EXTRACTION W/ INTRAOCULAR LENS  IMPLANT, BILATERAL  ? 2011  . CHOLECYSTECTOMY  2006 "or after"  . CORONARY ANGIOPLASTY WITH STENT PLACEMENT  2012  . CORONARY ARTERY BYPASS GRAFT  2000   CABG X5  . EYE SURGERY     bilateral cataract  . INCISION AND DRAINAGE OF WOUND  ~ 20ll; 12/03/11   "had infection in my right"  . LUMBAR LAMINECTOMY/DECOMPRESSION MICRODISCECTOMY  06/13/2011   Procedure: LUMBAR LAMINECTOMY/DECOMPRESSION MICRODISCECTOMY;  Surgeon: Karn Cassis;  Location: [facility];  Service: Neurosurgery;  Laterality: N/A;  Lumbar three, lumbar four-five Laminectomy  . LUMBAR LAMINECTOMY/DECOMPRESSION MICRODISCECTOMY Right 06/17/2013   Procedure: LUMBAR LAMINECTOMY/DECOMPRESSION MICRODISCECTOMY 1 LEVEL;  Surgeon: Karn Cassis, MD;  Location: [facility];  Service: Neurosurgery;  Laterality: Right;  Right L3-4 Microdiskectomy  . LUMBAR WOUND DEBRIDEMENT N/A 08/02/2013   Procedure: INCISION AND DRAINAGE OF LUMBAR WOUND DEBRIDEMENT;  Surgeon: Karn Cassis, MD;  Location: [facility];  Service: Neurosurgery;  Laterality: N/A;  . PERIPHERALLY INSERTED CENTRAL CATHETER INSERTION  2011 & 11/2011  . SHOULDER ARTHROSCOPY Left 09/13/2013   Procedure: ARTHROSCOPIC IRRIGATION AND DEBRIDEMENT, SYNOVECTOMY ,  LEFT SHOULDER ;  Surgeon: Thera Flake., MD;  Location: [facility];  Service: Orthopedics;  Laterality: Left;  . SHOULDER OPEN ROTATOR CUFF REPAIR  ~ 2011   right  . STERNAL SURG  2000   HAS HAD 5-6 ON HIS STERNUM; "caught MRSA in it"  . TONSILLECTOMY  1949   Social History:  reports that he quit smoking about 20 years ago. His smoking use  included cigarettes. He started smoking about 60 years ago. He has a 1.00 pack-year smoking history. He has never used smokeless tobacco. He reports that he does not drink alcohol or use drugs.   Family History  Problem Relation Age of Onset  . Heart disease Other   . Hypertension Mother   . Hypertension Maternal Aunt   . Hypertension Maternal Uncle   . Anesthesia problems Neg Hx   . Hypotension Neg Hx   . Malignant hyperthermia Neg Hx   . Pseudochol deficiency Neg Hx      Allergies  Allergen Reactions  . Morphine Other (See Comments)    REACTION: made him go crazy; "I had fun w/it; my family didn't think it was too funny"  . Metoprolol Other (See Comments)    REACTION:  Tachycardia; "don't remember how bad it was; it was so long ago"  . Rifampin     Itch      Prior to Admission medications   Medication Sig Start Date End Date Taking? Authorizing Provider  ALPRAZolam Prudy Feeler) 0.5 MG tablet Take 0.25-0.5 mg by mouth 2 (two) times daily as needed for anxiety.    [provider]  baclofen (LIORESAL) 10 MG tablet Take 10 mg by mouth 3 (three) times daily.    [provider]  finasteride (PROSCAR) 5 MG tablet Take 5 mg by mouth daily.    [provider]  Multiple Vitamin (MULTIVITAMIN) tablet Take 1 tablet by mouth daily.    [provider]  oxybutynin (DITROPAN XL) 15 MG 24 hr tablet Take 15 mg by mouth daily. 11/07/19   [provider]  predniSONE (DELTASONE) 10 MG tablet Take 5-10 mg by mouth daily with breakfast.  01/06/18   [provider]  tamsulosin (FLOMAX) 0.4 MG CAPS capsule Take 0.4 mg by mouth.    [provider]  traMADol (ULTRAM) 50 MG tablet Take 50 mg by mouth every 6 (six) hours as needed.    [provider]    Review of Systems:  Constitutional:  No weight loss, night sweats, Fevers, chills, fatigue.  Head&Eyes: No headache.  No vision loss.  No eye pain or scotoma ENT:  No Difficulty  swallowing,Tooth/dental problems,Sore throat,  No ear ache, post nasal drip,  Cardio-vascular:  No chest pain, Orthopnea, PND, swelling in lower extremities,  dizziness, palpitations  GI:  No  nausea, vomiting, diarrhea, loss of appetite, hematochezia, melena, heartburn, indigestion, Resp:  No shortness of breath with exertion or at rest. No cough. No coughing up of blood .No wheezing.No chest wall deformity  Skin:  no rash or lesions.  GU:  no dysuria, change in color of urine, no urgency or frequency. No flank pain.  Musculoskeletal:  Complains of pain all over, particularly in back Psych:  No change in mood or affect.  Neurologic: No headache, no dysesthesia, no focal weakness, no vision loss. No syncope  Physical Exam: There were no vitals filed for this visit. General:  A&O x 3, NAD, nontoxic, pleasant/cooperative Head/Eye: No conjunctival hemorrhage, no icterus, /AT, No nystagmus ENT:  No icterus,  No thrush, good dentition, no pharyngeal exudate Neck:  No masses, no lymphadenpathy, no bruits CV:  RRR, no rub, no gallop, no S3 Lung:  Diminished BS.  Bibasilar rales. No wheeze Abdomen: soft/NT, +BS, nondistended, no peritoneal signs Ext: No cyanosis, No rashes, No petechiae, No lymphangitis, No edema  Labs on Admission:  Basic Metabolic Panel: Recent Labs  Lab 12/18/19 1026 12/18/19 1026 12/19/19 0412 12/20/19 0641  NA 137  --  139 137  K 4.3   < > 4.4 5.1  CL 103  --  106 102  CO2 25  --  26 25  GLUCOSE 105*  --  82 112*  BUN 31*  --  27* 30*  CREATININE 1.34*  --  1.28* 1.28*  CALCIUM 8.8*  --  8.6* 9.0  MG  --   --   --  2.0   < > = values in this interval not displayed.   Liver Function Tests: Recent Labs  Lab 12/18/19 1026  AST 12*  ALT 10  ALKPHOS 56  BILITOT 0.7  PROT 6.0*  ALBUMIN 3.2*   No results for input(s): LIPASE, AMYLASE in the last 168 hours. No results for input(s): AMMONIA in the last 168 hours. CBC: Recent Labs  Lab  12/18/19 1026 12/19/19 0412 12/20/19 0641  WBC 7.0 6.0 5.7  NEUTROABS 5.7  --   --   HGB 8.4* 8.0* 8.4*  HCT 28.1* 27.3* 28.5*  MCV 85.2 85.8 85.3  PLT 247 248 266   Coagulation Profile: No results for input(s): INR, PROTIME in the last 168 hours. Cardiac Enzymes: Recent Labs  Lab 12/18/19 1725  CKTOTAL 28*   BNP: Invalid input(s): POCBNP CBG: Recent  Labs  Lab 12/19/19 1857  GLUCAP 126*   Urine analysis:    Component Value Date/Time   COLORURINE YELLOW 12/18/2019 1009   APPEARANCEUR HAZY (A) 12/18/2019 1009   LABSPEC 1.008 12/18/2019 1009   PHURINE 5.0 12/18/2019 1009   GLUCOSEU NEGATIVE 12/18/2019 1009   HGBUR SMALL (A) 12/18/2019 1009   BILIRUBINUR NEGATIVE 12/18/2019 1009   KETONESUR NEGATIVE 12/18/2019 1009   PROTEINUR NEGATIVE 12/18/2019 1009   UROBILINOGEN 0.2 09/05/2013 1335   NITRITE POSITIVE (A) 12/18/2019 1009   LEUKOCYTESUR LARGE (A) 12/18/2019 1009   Sepsis Labs: @LABRCNTIP (procalcitonin:4,lacticidven:4) ) Recent Results (from the past 240 hour(s))  Urine culture     Status: Abnormal (Preliminary result)   Collection Time: 12/18/19 10:09 AM   Specimen: Urine, Clean Catch  Result Value Ref Range Status   Specimen Description   Final    URINE, CLEAN CATCH Performed at [facility], [address] [place], [place] [address]    Special Requests   Final    NONE Performed at [facility], [address] [place], [place] [address]    Culture (A)  Final    >=100,000 COLONIES/mL PSEUDOMONAS AERUGINOSA SUSCEPTIBILITIES TO FOLLOW Performed at [facility], [address] [place], [place] [address]    Report Status PENDING  Incomplete  Blood culture (routine x 2)     Status: None (Preliminary result)   Collection Time: 12/18/19 10:26 AM   Specimen: Right Antecubital; Blood  Result Value Ref Range Status   Specimen Description   Final    RIGHT ANTECUBITAL BOTTLES DRAWN AEROBIC AND ANAEROBIC   Special Requests Blood Culture adequate volume   Final   Culture   Final    NO GROWTH 2 DAYS Performed at [facility], [address] [place], [place] [address]    Report Status PENDING  Incomplete  Blood culture (routine x 2)     Status: None (Preliminary result)   Collection Time: 12/18/19 10:27 AM   Specimen: BLOOD RIGHT WRIST  Result Value Ref Range Status   Specimen Description   Final    BLOOD RIGHT WRIST BOTTLES DRAWN AEROBIC AND ANAEROBIC   Special Requests Blood Culture adequate volume  Final   Culture   Final    NO GROWTH 2 DAYS Performed at [facility], [address] [place], [place] [address]    Report Status PENDING  Incomplete  SARS Coronavirus 2 by RT PCR (hospital order, performed in [facility] hospital lab) Nasopharyngeal Nasopharyngeal Swab     Status: None   Collection Time: 12/18/19 12:18 PM   Specimen: Nasopharyngeal Swab  Result Value Ref Range Status   SARS Coronavirus 2 NEGATIVE NEGATIVE Final    Comment: (NOTE) SARS-CoV-2 target nucleic acids are NOT DETECTED. The SARS-CoV-2 RNA is generally detectable in upper and lower respiratory specimens during the acute phase of infection. The lowest concentration of SARS-CoV-2 viral copies this assay can detect is 250 copies / mL. A negative result does not preclude SARS-CoV-2 infection and should not be used as the sole basis for treatment or other patient management decisions.  A negative result may occur with improper specimen collection / handling, submission of specimen other than nasopharyngeal swab, presence of viral mutation(s) within the areas targeted by this assay, and inadequate number of viral copies (<250 copies / mL). A negative result must be combined with clinical observations, patient history, and epidemiological information. Fact Sheet for Patients:   BoilerBrush.com.cyhttps://www.fda.gov/media/136312/download Fact Sheet for Healthcare Providers: https://pope.com/https://www.fda.gov/media/136313/download This test is not yet  approved or cleared  by the [place]  and has been authorized for detection and/or diagnosis of SARS-CoV-2 by FDA under an Emergency Use Authorization (EUA).  This EUA will remain in effect (meaning this test can be used) for the duration of the COVID-19 declaration under Section 564(b)(1) of the Act, 21 U.S.C. section 360bbb-3(b)(1), unless the authorization is terminated or revoked sooner. Performed at [facility], [address], [place], [place] [address]      Radiological Exams on Admission: No results found.      Time spent:40 minutes Code Status:   FULL COMFORT Family Communication: daughter updated bedside Disposition Plan: residential hospice when bed available Consults called: palliative medicine DVT Prophylaxis: FULL COMFORT  Catarina Hartshorn, DO  [facility] Pager [phone]  If 7PM-7AM, please contact night-coverage [email or web address] Password [facility] 12/20/2019, 4:59 PM    &p

## 2019-12-20 NOTE — Consult Note (Signed)
Consultation Note Date: 12/20/2019   Patient Name: [name]  DOB: [birth year]  MRN: [id number]  Age / Sex: 79 y.o., male  PCP: Celene Squibb, MD Referring Physician: Orson Eva, MD  Reason for Consultation: Establishing goals of care  HPI/Patient Profile: 79 y.o. male  with past medical history of anemia, a. 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 Had episode of a fib with RVR last night. Urine culture positive for gram negative rods. Palliative medicine consulted for goals of care.      Clinical Assessment and Goals of Care:  I have reviewed medical records including EPIC notes, labs and imaging, received report from Dr. Carles Collet, examined the patient and met at bedside with patient and his three children to discuss diagnosis prognosis, GOC, EOL wishes, disposition and options.  I introduced Palliative Medicine as specialized medical care for people living with serious illness. It focuses on providing relief from the symptoms and stress of a serious illness.   As far as functional and nutritional status- the patient has significantly declined in function and nutrition especially in the last several months and even more in the last several weeks. He is bedbound, incontinent of urine and stool. He now cannot lift his arms on his own. He is tired. He is quick to state this is not a quality of life that he would want to live.   When I bring up goals of care he and his family immediately say their primary goal is comfort only. [name] does not want to live a life of prolonged disability in a nursing home. If he cannot live and be cared for at home he would prefer comfort measures only and be allowed a natural  dying process.    The difference between aggressive medical intervention and comfort care was considered in light of the patient's goals of care. I reviewed with them that comfort care was providing interventions and medications indicated only to provide comfort- ie pain, anxiety medication, and interventions that are life prolonging such as IV fluids, antibiotics, lab test, would be stopped. All are in agreement to this plan of care.   Patient stated he has a DNR in place.   Hospice and Palliative Care services outpatient were explained and offered. Family would like to request a bed at residential Hospice house in [place].   Questions and concerns were addressed.  The family was encouraged to call with questions or concerns.   Primary Decision Maker Patient    SUMMARY OF RECOMMENDATIONS -DNR -Transition to full comfort -Comfort medications as ordered -TOC referral for [facility] home    Code Status/Advance Care Planning:  DNR  Psycho-social/Spiritual:   Desire for further Chaplaincy support:yes  Prognosis:    < 6 weeks  Discharge Planning: Hospice facility  Primary Diagnoses: Present on Admission: . Persistent atrial fibrillation ([place]) . Hyperlipidemia, unspecified .  Essential hypertension . Chronic diastolic (congestive) heart failure ([place]) . Cauda equina spinal cord injury ([place])   I have reviewed the medical record, interviewed the patient and family, and examined the patient. The following aspects are pertinent.  Past Medical History:  Diagnosis Date  . Acute kidney failure, unspecified ([place])   . Acute renal failure ([place]) 04/17/2013  . Anemia, iron deficiency 09/09/2013  . Anxiety   . Anxiety disorder, unspecified   . Arthritis   . Atherosclerotic heart disease of native coronary artery without angina pectoris   . Atrial fibrillation ([place])   . 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 Cauda equina syndrome ([place])   . Chronic  diastolic (congestive) heart failure ([place])   . Chronic kidney disease, stage 1   . Coronary artery disease    a. s/p CABG in 2000 with low-risk NST in 01/2017  . Diarrhea, unspecified   . Disorder of blood    BEEN TREATED BY DERMATOLOGIST X 4 YRS..."BLOOD BLISTERS"  . Epistaxis   . Essential (primary) hypertension   . Gastro-esophageal reflux disease without esophagitis   . Gout   . Gout, unspecified   . HTN (hypertension)    sees Dr. [name] in [place]  . Hx MRSA infection    rt shoulder  . Hyperglycemia, unspecified   . Hyperlipidemia   . Hyperlipidemia, unspecified   . Inflammatory disease of prostate, unspecified   . Iron deficiency anemia, unspecified   . Localized edema   . Low back pain   . Osteonecrosis, unspecified ([place])   . Other cervical disc degeneration, unspecified cervical region   . Personal history of Methicillin resistant Staphylococcus aureus infection   . Pneumonia    "I've had it 3-4 times"  . Pneumonia, unspecified organism   . Retention of urine, unspecified   . Secondary osteoarthritis, unspecified site   . Small bowel obstruction ([place])   . Small bowel problem    HAD ALOT OF SCAR TISSUE FROM PREVIOUS SURGERIES..NG WAS INSERTED ...Marland KitchenMarland KitchenNO SURGERY NEEDED...Marland KitchenMarland KitchenIN FOR 8 DAYS  . Unspecified abnormalities of gait and mobility   . Unspecified atrial fibrillation ([place])   . Ventral hernia   . Ventral hernia without obstruction or gangrene    Social History   Socioeconomic History  . Marital status: Married    Spouse name: Not on file  . Number of children: Not on file  . Years of education: Not on file  . Highest education level: Not on file  Occupational History  . Not on file  Tobacco Use  . Smoking status: Former Smoker    Packs/day: 1.00    Years: 1.00    Pack years: 1.00    Types: Cigarettes    Start date: 12/29/1959    Quit date: 12/27/1998    Years since quitting: 20.9  . Smokeless tobacco: Never Used  Substance and Sexual Activity  .  Alcohol use: No    Alcohol/week: 0.0 standard drinks  . Drug use: No  . Sexual activity: Not Currently  Other Topics Concern  . Not on file  Social History Narrative  . Not on file   Social Determinants of Health   Financial Resource Strain:   . Difficulty of Paying Living Expenses:   Food Insecurity:   . Worried About Charity fundraiser in the Last Year:   . Arboriculturist in the Last Year:   Transportation Needs:   . Film/video editor (Medical):   Marland Kitchen  Lack of Transportation (Non-Medical):   Physical Activity:   . Days of Exercise per Week:   . Minutes of Exercise per Session:   Stress:   . Feeling of Stress :   Social Connections:   . Frequency of Communication with Friends and Family:   . Frequency of Social Gatherings with Friends and Family:   . Attends Religious Services:   . Active Member of Clubs or Organizations:   . Attends Archivist Meetings:   Marland Kitchen Marital Status:    Family History  Problem Relation Age of Onset  . Heart disease Other   . Hypertension Mother   . Hypertension Maternal Aunt   . Hypertension Maternal Uncle   . Anesthesia problems Neg Hx   . Hypotension Neg Hx   . Malignant hyperthermia Neg Hx   . Pseudochol deficiency Neg Hx    Scheduled Meds: . Chlorhexidine Gluconate Cloth  6 each Topical Daily  . finasteride  5 mg Oral Daily  . furosemide  20 mg Oral Daily  . oxybutynin  15 mg Oral Daily  . polyethylene glycol  17 g Oral Daily  . predniSONE  10 mg Oral Q breakfast  . rivaroxaban  15 mg Oral Q supper  . senna  2 tablet Oral Daily  . sodium chloride flush  3 mL Intravenous Q12H  . tamsulosin  0.4 mg Oral QPC breakfast   Continuous Infusions: . sodium chloride     PRN Meds:.sodium chloride, acetaminophen **OR** acetaminophen, atropine, LORazepam **OR** LORazepam **OR** LORazepam, morphine CONCENTRATE **OR** morphine CONCENTRATE, ondansetron **OR** ondansetron (ZOFRAN) IV, sodium chloride flush, traMADol Medications  Prior to Admission:  Prior to Admission medications   Medication Sig Start Date End Date Taking? Authorizing Provider  acetaminophen (TYLENOL) 325 MG tablet Take 1-2 tablets (325-650 mg total) by mouth every 4 (four) hours as needed for mild pain. 08/19/13  Yes Love, Ivan Anchors, PA-C  ALPRAZolam Duanne Moron) 0.5 MG tablet Take 0.25-0.5 mg by mouth 2 (two) times daily as needed for anxiety.   Yes [provider]  amLODipine (NORVASC) 5 MG tablet Take 5 mg by mouth daily.   Yes [provider]  atorvastatin (LIPITOR) 40 MG tablet TAKE 1 TABLET BY MOUTH EVERY DAY 09/03/15  Yes Herminio Commons, MD  baclofen (LIORESAL) 10 MG tablet Take 10 mg by mouth 3 (three) times daily.   Yes [provider]  docusate sodium (COLACE) 100 MG capsule Take 100 mg by mouth daily.   Yes [provider]  doxazosin (CARDURA) 8 MG tablet Take 8 mg by mouth daily.   Yes [provider]  finasteride (PROSCAR) 5 MG tablet Take 5 mg by mouth daily.   Yes [provider]  furosemide (LASIX) 20 MG tablet Take 1 tablet (20 mg total) by mouth daily. 05/31/19 12/18/19 Yes Imogene Burn, PA-C  HYDROcodone-acetaminophen (NORCO/VICODIN) 5-325 MG tablet Take 1-2 tablets by mouth every 6 (six) hours as needed. 06/20/19  Yes Volanda Napoleon, PA-C  Multiple Vitamin (MULTIVITAMIN) tablet Take 1 tablet by mouth daily.   Yes [provider]  nitroGLYCERIN (NITROSTAT) 0.4 MG SL tablet Place 0.4 mg under the tongue every 5 (five) minutes as needed for chest pain. May take up to 3 doses per episode. Contact MD or 911 if unresolved chest pain continues. 01/09/11  Yes Lendon Colonel, NP  oxybutynin (DITROPAN XL) 15 MG 24 hr tablet Take 15 mg by mouth daily. 11/07/19  Yes [provider]  predniSONE (DELTASONE) 10  MG tablet Take 5-10 mg by mouth daily with breakfast.  01/06/18  Yes [provider]  rivaroxaban (XARELTO) 20 MG TABS tablet Take 1 tablet (20 mg total) by  mouth daily with supper. 08/23/19  Yes Herminio Commons, MD  tamsulosin (FLOMAX) 0.4 MG CAPS capsule Take 0.4 mg by mouth.   Yes [provider]  cephALEXin (KEFLEX) 250 MG capsule Take 1 capsule (250 mg total) by mouth 4 (four) times daily. Patient not taking: Reported on 12/18/2019 11/25/19   Hayden Rasmussen, MD  traMADol (ULTRAM) 50 MG tablet Take 50 mg by mouth every 6 (six) hours as needed.    [provider]   Allergies  Allergen Reactions  . Morphine Other (See Comments)    REACTION: made him go crazy; "I had fun w/it; my family didn't think it was too funny"  . Metoprolol Other (See Comments)    REACTION:  Tachycardia; "don't remember how bad it was; it was so long ago"  . Rifampin     Itch    Review of Systems  Constitutional: Positive for activity change, appetite change, fatigue and unexpected weight change.  Respiratory: Negative for shortness of breath.   Gastrointestinal: Positive for constipation. Negative for abdominal distention.  Genitourinary: Positive for dysuria.    Physical Exam Vitals reviewed.  Musculoskeletal:     Comments: Diffuse weakness  Skin:    Coloration: Skin is pale.  Neurological:     Mental Status: He is alert and oriented to person, place, and time.     Comments: paraplegia  Psychiatric:        Mood and Affect: Mood normal.        Behavior: Behavior normal.        Thought Content: Thought content normal.     Vital Signs: BP (!) 119/56 (BP Location: Right Arm)   Pulse 72   Temp 98.4 F (36.9 C) (Oral)   Resp 20   Ht 6' (1.829 m)   Wt 64.3 kg   SpO2 96%   BMI 19.23 kg/m  Pain Scale: 0-10   Pain Score: 9    SpO2: SpO2: 96 % O2 Device:SpO2: 96 % O2 Flow Rate: .   IO: Intake/output summary:   Intake/Output Summary (Last 24 hours) at 12/20/2019 1032 Last data filed at 12/19/2019 1530 Gross per 24 hour  Intake 120 ml  Output 900 ml  Net -780 ml    LBM: Last BM Date: 12/14/19 Baseline Weight: Weight:  63.5 kg Most recent weight: Weight: 64.3 kg     Palliative Assessment/Data: PPS: 20%     Thank you for this consult. Palliative medicine will continue to follow and assist as needed.   Time In: 0930 Time Out: 1035 Time Total: 65 minutes Greater than 50%  of this time was spent counseling and coordinating care related to the above assessment and plan.  Signed by: Mariana Kaufman, AGNP-C Palliative Medicine    Please contact Palliative Medicine Team phone at [phone] for questions and concerns.  For individual provider: See Shea Evans

## 2019-12-20 NOTE — Progress Notes (Signed)
Patient expressed concern for spiritual support and prayer. His family is local but was not present during visit. As visit progressed, patient stated that he was "less scared and he did not want to suffer." Helped to connect patient with his pastor by calling. He thank me for my visit and said "it really helps to talk."

## 2019-12-20 NOTE — Progress Notes (Signed)
Pt refused morning bath "saying no, it hurts." pt recently medicated with tylenol per eMAR. Suggested to pt to removed shirt and place gown- pt also refused. Will report to oncoming shift to offer again.

## 2019-12-20 NOTE — Plan of Care (Signed)

## 2019-12-21 ENCOUNTER — Encounter (HOSPITAL_COMMUNITY): Payer: Medicare Other | Admitting: Physical Therapy

## 2019-12-21 DIAGNOSIS — N390 Urinary tract infection, site not specified: Secondary | ICD-10-CM

## 2019-12-21 DIAGNOSIS — N401 Enlarged prostate with lower urinary tract symptoms: Secondary | ICD-10-CM

## 2019-12-21 DIAGNOSIS — I4819 Other persistent atrial fibrillation: Secondary | ICD-10-CM

## 2019-12-21 DIAGNOSIS — R627 Adult failure to thrive: Secondary | ICD-10-CM

## 2019-12-21 DIAGNOSIS — R262 Difficulty in walking, not elsewhere classified: Secondary | ICD-10-CM

## 2019-12-21 DIAGNOSIS — Z515 Encounter for palliative care: Principal | ICD-10-CM

## 2019-12-21 DIAGNOSIS — G834 Cauda equina syndrome: Secondary | ICD-10-CM

## 2019-12-21 DIAGNOSIS — R3914 Feeling of incomplete bladder emptying: Secondary | ICD-10-CM

## 2019-12-21 LAB — SARS CORONAVIRUS 2 BY RT PCR (HOSPITAL ORDER, PERFORMED IN ~~LOC~~ HOSPITAL LAB): SARS Coronavirus 2: NEGATIVE

## 2019-12-21 LAB — URINE CULTURE: Culture: >=100000 — AB

## 2019-12-21 MED ORDER — GLYCOPYRROLATE 1 MG PO TABS: 1.0000 mg | ORAL_TABLET | ORAL | Status: AC | PRN

## 2019-12-21 MED ORDER — ONDANSETRON 4 MG PO TBDP: 4.0000 mg | ORAL_TABLET | Freq: Four times a day (QID) | ORAL | 0 refills | Status: AC | PRN

## 2019-12-21 MED ORDER — TRAMADOL HCL 50 MG PO TABS: 50.0000 mg | ORAL_TABLET | Freq: Four times a day (QID) | ORAL | Status: AC | PRN

## 2019-12-21 MED ORDER — POLYVINYL ALCOHOL 1.4 % OP SOLN: 1.0000 [drp] | Freq: Four times a day (QID) | OPHTHALMIC | 0 refills | Status: AC | PRN

## 2019-12-21 MED ORDER — HALOPERIDOL 0.5 MG PO TABS: 0.5000 mg | ORAL_TABLET | ORAL | Status: AC | PRN

## 2019-12-21 MED ORDER — MORPHINE SULFATE (PF) 2 MG/ML IV SOLN: 1.0000 mg | INTRAVENOUS | Status: AC | PRN

## 2019-12-21 NOTE — Plan of Care (Signed)

## 2019-12-21 NOTE — Progress Notes (Signed)
Patient placed on contact precautions for MDRO in urine, per Galen Manila, RN.

## 2019-12-21 NOTE — Progress Notes (Signed)
Transport here to pick up patient. Hospice updated.

## 2019-12-21 NOTE — Discharge Summary (Signed)
Physician Discharge Summary  [name]:[id number] DOB: 21-Apr-1941 DOA: 12/20/2019  PCP: Jake Squibb, MD  Admit date: 12/20/2019 Discharge date: 12/21/2019  Time spent: 35 minutes  Recommendations for Outpatient Follow-up:  1. Symptomatic management 2. End-of-life care   Discharge Diagnoses:  Active Problems:   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)   Difficulty walking   Persistent atrial fibrillation (HCC)   Generalized weakness   Failure to thrive in adult   Goals of care, counseling/discussion   End of life care   Benign prostatic hyperplasia with incomplete bladder emptying   Discharge Condition: Stable and comfortable: Repeated discharge to home hospice for further symptomatic management, end-of-life care.  Diet recommendation: Comfort feeding  Brief history of present illness:  79 y.o.malewith medical history ofparaplegia secondary to cauda equina syndrome, hypertension, coronary artery disease, persistent atrial fibrillation, hypertension, diastolic CHF, anxiety, and urinary retention presenting with generalized weakness for the past month that has significantly worsened in the past 2 weeks. The patient is normally wheelchair-bound, but for the last 2 weeks he has had difficulty even making transfers out of bed to his wheelchair. He has required essentially max assist from his spouse who can no longer manage him at home secondary to the patient's progressive generalized weakness. The patient states that he is essentially not been out of bed for the past 2 weeks. Notably, the patient had urinary retention and visited to the emergency department on 11/25/2019. A Foley catheter was placed at that time. He followed up with his urologist at [facility] on 12/08/2019. His Foley bag was changed at that time and a urine sample was given. It grew MDR Pseudomonas. It was felt to be colonization. The patient himself complains of some intermittent shortness of  breath but denies any coughing, chest pain, nausea, vomiting, diarrhea. He has some intermittent suprapubic discomfort. He has struggles with constipation and has to take Colace on a regular basis. His last bowel movement was approximately 4 days prior to this admission. In the emergency department, the patient was afebrile hemodynamically stable with oxygen saturation 98% room air. BMP showed a serum creatinine of 1.34 which is near his baseline but elevated BUN of 31. LFTs were unremarkable. WBC at 7.0 hemoglobin 8.4, platelets 247,000. UA shows >50 WBC. Although the patient initially received a dose of gentamicin in the ED, this was discontinued as his bacteruria was likely colonization with patient remaining afebrile and hemodynamically stable. During the hospitalization, his po intake remained poor. He was started on IVF. He developed RVR with his persistent afib. He was started on diltiazem. He continued to remain weak and essentially bed bound. Multiple discussions were held with patient's family regarding advanced care planning. They requested a palliative medicine consult. Family meeting was held on 12/20/19 with the palliative medicine team. The family and patient felt he would be best served by transitioning his focus of care to be directed at full comfort. Medications with curative intent were subsequently discontinued. Because there were no beds available, the patient was transitioned to general inpatient hospice status.  Hospital Course:  Generalized weakness -Likely due to dehydration and deconditioning -Not convinced the patient has UTI in the setting of paraplegia with chronic indwelling Foley catheter -Serum W11--914 -NWG--9.562 -Folic ZHYQ--65.7 -UA as discussed above--follow culture -PT evaluationinitially ordered and recommended SNF -family meeting with palliative medicine team 12/20/19-->The family and patient felt he would be best served by transitioning his focus  of care to be directed at full comfort. Medications with  curative intent were subsequently discontinued.   Failure to Thrive -poor po intake -continued decline in functional status -family/spouse unable to care for patient at home -PT eval -repletedB12 initially -IVFinitially started -personally reviewed CXR--chronic interstitial change -palliative medicine consult -family meeting with palliative medicine team 12/20/19-->The family and patient felt he would be best served by transitioning his focus of care to be directed at full comfort. Medications with curative intent were subsequently discontinued.   Bacteriuria -12/08/2019 urine culture--MDR Pseudomonas -Not convinced that truly represents an infection as the patient is afebrile without any leukocytosis -Hold off additional antibiotics at this time -Follow culture data -likely colonization -family meeting with palliative medicine team 12/20/19-->The family and patient felt he would be best served by transitioning his focus of care to be directed at full comfort. Medications with curative intent were subsequently discontinued.   CKD 3a -baseline creatinine 1.2-1.4 -family meeting with palliative medicine team 12/20/19-->The family and patient felt he would be best served by transitioning his focus of care to be directed at full comfort. Medications with curative intent were subsequently discontinued.   Essential hypertension -Holding amlodipine -Continue Cardura -no longer active issue  Persistent atrial fibrillationwith RVR -initially started on diltiazem -Continue rivaroxaban -personally reviewed EKG--afib, nonspecific T wave change -family meeting with palliative medicine team 12/20/19-->The family and patient felt he would be best served by transitioning his focus of care to be directed at full comfort. Medications with curative intent were subsequently discontinued.   Hyperlipidemia -Continue  statin  Chronic diastolic CHF -Patient is clinically euvolemic -Daily weights -Hold Lasix for today and evaluate for restart in a.m. -12/27/2018 echo EF 55 to 60%, mild LVH -no longer active issue  Urinary retention/neurogenic bladder -Continue tamsulosin and Proscar -Continue Ditropan -maintain chronic indwelling foley catheter -family meeting with palliative medicine team 12/20/19-->The family and patient felt he would be best served by transitioning his focus of care to be directed at full comfort. Medications with curative intent were subsequently discontinued.   Anxiety/depression -Continue home dose alprazolam and Wellbutrin  Procedures: None  Consultations:  Palliative care consult  Discharge Exam: General: Alert, awake, oriented and cooperative with examination.  Currently afebrile.  Reports having some ongoing pain in his lower back, but improved with pain medication given. Cardiovascular: Irregular, no rubs, no gallops. Respiratory: No using accessory muscles.  Positive scattered rhonchi bilaterally. Abdomen: Soft, nondistended, positive bowel sounds. Extremities: No cyanosis or clubbing.  Discharge Instructions   Discharge Instructions    Discharge instructions   Complete by: As directed    Comfort measures and end of life care.     Allergies as of 12/21/2019      Reactions   Morphine Other (See Comments)   REACTION: made him go crazy; "I had fun w/it; my family didn't think it was too funny"   Metoprolol Other (See Comments)   REACTION:  Tachycardia; "don't remember how bad it was; it was so long ago"   Rifampin    Itch      Medication List    STOP taking these medications   finasteride 5 MG tablet Commonly known as: PROSCAR   multivitamin tablet   oxybutynin 15 MG 24 hr tablet Commonly known as: DITROPAN XL   predniSONE 10 MG tablet Commonly known as: DELTASONE     TAKE these medications   ALPRAZolam 0.5 MG tablet Commonly known as:  XANAX Take 0.25-0.5 mg by mouth 2 (two) times daily as needed for anxiety.   baclofen 10 MG tablet Commonly known  as: LIORESAL Take 10 mg by mouth 3 (three) times daily.   glycopyrrolate 1 MG tablet Commonly known as: ROBINUL Take 1 tablet (1 mg total) by mouth every 4 (four) hours as needed (excessive secretions).   haloperidol 0.5 MG tablet Commonly known as: HALDOL Take 1 tablet (0.5 mg total) by mouth every 4 (four) hours as needed for agitation (or delirium).   morphine 2 MG/ML injection Inject 0.5 mLs (1 mg total) into the vein every 2 (two) hours as needed (or dyspnea).   ondansetron 4 MG disintegrating tablet Commonly known as: ZOFRAN-ODT Take 1 tablet (4 mg total) by mouth every 6 (six) hours as needed for nausea.   polyvinyl alcohol 1.4 % ophthalmic solution Commonly known as: LIQUIFILM TEARS Place 1 drop into both eyes 4 (four) times daily as needed for dry eyes.   tamsulosin 0.4 MG Caps capsule Commonly known as: FLOMAX Take 0.4 mg by mouth.   traMADol 50 MG tablet Commonly known as: ULTRAM Take 1 tablet (50 mg total) by mouth every 6 (six) hours as needed for moderate pain. What changed: reasons to take this      Allergies  Allergen Reactions  . Morphine Other (See Comments)    REACTION: made him go crazy; "I had fun w/it; my family didn't think it was too funny"  . Metoprolol Other (See Comments)    REACTION:  Tachycardia; "don't remember how bad it was; it was so long ago"  . Rifampin     Itch      The results of significant diagnostics from this hospitalization (including imaging, microbiology, ancillary and laboratory) are listed below for reference.    Significant Diagnostic Studies: CT HEAD WO CONTRAST  Result Date: 12/18/2019 CLINICAL DATA:  Weakness, history of paraplegia EXAM: CT HEAD WITHOUT CONTRAST TECHNIQUE: Contiguous axial images were obtained from the base of the skull through the vertex without intravenous contrast. COMPARISON:   06/20/2019 FINDINGS: Brain: No evidence of acute infarction, hemorrhage, hydrocephalus, extra-axial collection or mass lesion/mass effect. Periventricular white matter hypodensity. Vascular: No hyperdense vessel or unexpected calcification. Skull: Normal. Negative for fracture or focal lesion. Sinuses/Orbits: No acute finding. Other: None. IMPRESSION: No acute intracranial pathology.  Small-vessel white matter disease. Electronically Signed   By: Lauralyn Primes M.D.   On: 12/18/2019 16:52   DG CHEST PORT 1 VIEW  Result Date: 12/18/2019 CLINICAL DATA:  Generalized weakness, urinary tract infection EXAM: PORTABLE CHEST 1 VIEW COMPARISON:  07/28/2019 FINDINGS: 2 frontal views of the chest demonstrate a stable cardiac silhouette. Continued atherosclerosis of the thoracic aorta. Chronic right pleural thickening versus trace pleural effusion, stable. No airspace disease or pneumothorax. Postsurgical changes at the cervicothoracic junction and thoracolumbar junction. IMPRESSION: 1. Stable exam, no acute process. Electronically Signed   By: Sharlet Salina M.D.   On: 12/18/2019 16:54    Microbiology: Recent Results (from the past 240 hour(s))  Urine culture     Status: Abnormal   Collection Time: 12/18/19 10:09 AM   Specimen: Urine, Clean Catch  Result Value Ref Range Status   Specimen Description   Final    URINE, CLEAN CATCH Performed at [facility], [address] [place], [place] [address]    Special Requests   Final    NONE Performed at [facility], [address], [place], [place] [address]    Culture (A)  Final    >=100,000 COLONIES/mL PSEUDOMONAS AERUGINOSA MULTI-DRUG RESISTANT ORGANISM    Report Status 12/21/2019 FINAL  Final   Organism ID, Bacteria  PSEUDOMONAS AERUGINOSA (A)  Final      Susceptibility   Pseudomonas aeruginosa - MIC*    CEFTAZIDIME 32 RESISTANT Resistant     CIPROFLOXACIN >=4 RESISTANT Resistant     GENTAMICIN RESISTANT Resistant     IMIPENEM >=16 RESISTANT Resistant      CEFEPIME 16 INTERMEDIATE Intermediate     * >=100,000 COLONIES/mL PSEUDOMONAS AERUGINOSA  Blood culture (routine x 2)     Status: None (Preliminary result)   Collection Time: 12/18/19 10:26 AM   Specimen: Right Antecubital; Blood  Result Value Ref Range Status   Specimen Description   Final    RIGHT ANTECUBITAL BOTTLES DRAWN AEROBIC AND ANAEROBIC   Special Requests Blood Culture adequate volume  Final   Culture   Final    NO GROWTH 3 DAYS Performed at [facility], [address] [place], [place] [address]    Report Status PENDING  Incomplete  Blood culture (routine x 2)     Status: None (Preliminary result)   Collection Time: 12/18/19 10:27 AM   Specimen: BLOOD RIGHT WRIST  Result Value Ref Range Status   Specimen Description   Final    BLOOD RIGHT WRIST BOTTLES DRAWN AEROBIC AND ANAEROBIC   Special Requests Blood Culture adequate volume  Final   Culture   Final    NO GROWTH 3 DAYS Performed at [facility], [address] [place], [place] [address]    Report Status PENDING  Incomplete  SARS Coronavirus 2 by RT PCR (hospital order, [facility]) Nasopharyngeal Nasopharyngeal Swab     Status: None   Collection Time: 12/18/19 12:18 PM   Specimen: Nasopharyngeal Swab  Result Value Ref Range Status   SARS Coronavirus 2 NEGATIVE NEGATIVE Final    Comment: (NOTE) SARS-CoV-2 target nucleic acids are NOT DETECTED. The SARS-CoV-2 RNA is generally detectable in upper and lower respiratory specimens during the acute phase of infection. The lowest concentration of SARS-CoV-2 viral copies this assay can detect is 250 copies / mL. A negative result does not preclude SARS-CoV-2 infection and should not be used as the sole basis for treatment or other patient management decisions.  A negative result may occur with improper specimen collection / handling, submission of specimen other than nasopharyngeal swab, presence of viral mutation(s) within the areas  targeted by this assay, and inadequate number of viral copies (<250 copies / mL). A negative result must be combined with clinical observations, patient history, and epidemiological information. Fact Sheet for Patients:   BoilerBrush.com.cyhttps://www.fda.gov/media/136312/download Fact Sheet for Healthcare Providers: https://pope.com/https://www.fda.gov/media/136313/download This test is not yet approved or cleared  by the [place] FDA and has been authorized for detection and/or diagnosis of SARS-CoV-2 by FDA under an Emergency Use Authorization (EUA).  This EUA will remain in effect (meaning this test can be used) for the duration of the COVID-19 declaration under Section 564(b)(1) of the Act, 21 U.S.C. section 360bbb-3(b)(1), unless the authorization is terminated or revoked sooner. Performed at [facility], [address] [place], [place] [address]      Labs: Basic Metabolic Panel: Recent Labs  Lab 12/18/19 1026 12/19/19 0412 12/20/19 0641  NA 137 139 137  K 4.3 4.4 5.1  CL 103 106 102  CO2 25 26 25   GLUCOSE 105* 82 112*  BUN 31* 27* 30*  CREATININE 1.34* 1.28* 1.28*  CALCIUM 8.8* 8.6* 9.0  MG  --   --  2.0   Liver Function Tests: Recent Labs  Lab 12/18/19 1026  AST  12*  ALT 10  ALKPHOS 56  BILITOT 0.7  PROT 6.0*  ALBUMIN 3.2*   CBC: Recent Labs  Lab 12/18/19 1026 12/19/19 0412 12/20/19 0641  WBC 7.0 6.0 5.7  NEUTROABS 5.7  --   --   HGB 8.4* 8.0* 8.4*  HCT 28.1* 27.3* 28.5*  MCV 85.2 85.8 85.3  PLT 247 248 266   Cardiac Enzymes: Recent Labs  Lab 12/18/19 1725  CKTOTAL 28*   BNP: BNP (last 3 results) Recent Labs    05/31/19 0814 06/13/19 1215  BNP 545.0* 508.0*    CBG: Recent Labs  Lab 12/19/19 1857  GLUCAP 126*    Signed:  Vassie Loll MD.  [facility] 12/21/2019, 2:24 PM

## 2019-12-21 NOTE — Progress Notes (Signed)
Report called in to hospice house. Will put in for transport.

## 2019-12-21 NOTE — Progress Notes (Signed)
0.5 ml IV morphine wasted in stericycle, witnessed by Guy Begin, RN Charge nurse.

## 2019-12-23 LAB — CULTURE, BLOOD (ROUTINE X 2)
Culture: NO GROWTH
Culture: NO GROWTH
Special Requests: ADEQUATE
Special Requests: ADEQUATE

## 2019-12-27 ENCOUNTER — Encounter (HOSPITAL_COMMUNITY): Payer: Medicare Other | Admitting: Physical Therapy

## 2019-12-27 DEATH — deceased

## 2019-12-28 ENCOUNTER — Encounter (INDEPENDENT_AMBULATORY_CARE_PROVIDER_SITE_OTHER): Payer: Medicare Other | Admitting: Ophthalmology

## 2020-01-02 ENCOUNTER — Encounter (HOSPITAL_COMMUNITY): Payer: Medicare Other | Admitting: Physical Therapy

## 2020-01-04 ENCOUNTER — Encounter (HOSPITAL_COMMUNITY): Payer: Medicare Other | Admitting: Physical Therapy

## 2020-01-06 ENCOUNTER — Encounter (HOSPITAL_COMMUNITY): Payer: Medicare Other

## 2020-01-09 ENCOUNTER — Encounter (HOSPITAL_COMMUNITY): Payer: Medicare Other | Admitting: Physical Therapy

## 2020-01-11 ENCOUNTER — Encounter (HOSPITAL_COMMUNITY): Payer: Medicare Other | Admitting: Physical Therapy

## 2020-01-13 ENCOUNTER — Encounter (HOSPITAL_COMMUNITY): Payer: Medicare Other

## 2020-01-16 ENCOUNTER — Encounter (HOSPITAL_COMMUNITY): Payer: Medicare Other | Admitting: Physical Therapy

## 2020-01-18 ENCOUNTER — Encounter (HOSPITAL_COMMUNITY): Payer: Medicare Other | Admitting: Physical Therapy

## 2020-01-20 ENCOUNTER — Encounter (HOSPITAL_COMMUNITY): Payer: Medicare Other

## 2020-01-23 ENCOUNTER — Encounter (HOSPITAL_COMMUNITY): Payer: Medicare Other | Admitting: Physical Therapy

## 2020-01-25 ENCOUNTER — Encounter (HOSPITAL_COMMUNITY): Payer: Medicare Other | Admitting: Physical Therapy

## 2020-01-27 ENCOUNTER — Encounter (HOSPITAL_COMMUNITY): Payer: Medicare Other | Admitting: Physical Therapy

## 2020-07-31 ENCOUNTER — Telehealth: Payer: Self-pay | Admitting: Cardiology

## 2020-07-31 NOTE — Telephone Encounter (Signed)
Spoke with patient's daughter, she informed me that [name] is deceased as of the end of 12/26/19... 07/31/20.Marland KitchenMarland Kitchen
